# Patient Record
Sex: Female | Born: 1935
Health system: Southern US, Community
[De-identification: ages and names within clinical notes are randomized; demographics above are authoritative.]

## PROBLEM LIST (undated history)

## (undated) DIAGNOSIS — E785 Hyperlipidemia, unspecified: Secondary | ICD-10-CM

## (undated) DIAGNOSIS — I441 Atrioventricular block, second degree: Secondary | ICD-10-CM

## (undated) DIAGNOSIS — M1990 Unspecified osteoarthritis, unspecified site: Secondary | ICD-10-CM

## (undated) DIAGNOSIS — K449 Diaphragmatic hernia without obstruction or gangrene: Secondary | ICD-10-CM

## (undated) DIAGNOSIS — G629 Polyneuropathy, unspecified: Secondary | ICD-10-CM

## (undated) DIAGNOSIS — Z86711 Personal history of pulmonary embolism: Secondary | ICD-10-CM

## (undated) DIAGNOSIS — Z8719 Personal history of other diseases of the digestive system: Secondary | ICD-10-CM

## (undated) DIAGNOSIS — I10 Essential (primary) hypertension: Secondary | ICD-10-CM

## (undated) DIAGNOSIS — E039 Hypothyroidism, unspecified: Secondary | ICD-10-CM

## (undated) DIAGNOSIS — N289 Disorder of kidney and ureter, unspecified: Secondary | ICD-10-CM

## (undated) DIAGNOSIS — E669 Obesity, unspecified: Secondary | ICD-10-CM

## (undated) DIAGNOSIS — I5032 Chronic diastolic (congestive) heart failure: Secondary | ICD-10-CM

## (undated) DIAGNOSIS — Z952 Presence of prosthetic heart valve: Secondary | ICD-10-CM

## (undated) DIAGNOSIS — B029 Zoster without complications: Secondary | ICD-10-CM

## (undated) DIAGNOSIS — I82402 Acute embolism and thrombosis of unspecified deep veins of left lower extremity: Secondary | ICD-10-CM

## (undated) DIAGNOSIS — K219 Gastro-esophageal reflux disease without esophagitis: Secondary | ICD-10-CM

## (undated) DIAGNOSIS — Z950 Presence of cardiac pacemaker: Secondary | ICD-10-CM

## (undated) DIAGNOSIS — F32A Depression, unspecified: Secondary | ICD-10-CM

## (undated) DIAGNOSIS — I4892 Unspecified atrial flutter: Secondary | ICD-10-CM

## (undated) DIAGNOSIS — I4891 Unspecified atrial fibrillation: Secondary | ICD-10-CM

## (undated) DIAGNOSIS — F419 Anxiety disorder, unspecified: Secondary | ICD-10-CM

## (undated) DIAGNOSIS — I2699 Other pulmonary embolism without acute cor pulmonale: Secondary | ICD-10-CM

## (undated) DIAGNOSIS — R911 Solitary pulmonary nodule: Secondary | ICD-10-CM

## (undated) DIAGNOSIS — E119 Type 2 diabetes mellitus without complications: Secondary | ICD-10-CM

## (undated) DIAGNOSIS — F329 Major depressive disorder, single episode, unspecified: Secondary | ICD-10-CM

## (undated) HISTORY — DX: Acute embolism and thrombosis of unspecified deep veins of left lower extremity: I82.402

## (undated) HISTORY — DX: Disorder of kidney and ureter, unspecified: N28.9

## (undated) HISTORY — PX: ROTATOR CUFF REPAIR: SHX139

## (undated) HISTORY — PX: APPENDECTOMY: SHX54

## (undated) HISTORY — DX: Depression, unspecified: F32.A

## (undated) HISTORY — DX: Polyneuropathy, unspecified: G62.9

## (undated) HISTORY — PX: HERNIA REPAIR: SHX51

## (undated) HISTORY — DX: Unspecified atrial fibrillation: I48.91

## (undated) HISTORY — DX: Diaphragmatic hernia without obstruction or gangrene: K44.9

## (undated) HISTORY — PX: CATARACT EXTRACTION: SUR2

## (undated) HISTORY — DX: Hyperlipidemia, unspecified: E78.5

## (undated) HISTORY — DX: Type 2 diabetes mellitus without complications: E11.9

## (undated) HISTORY — DX: Gastro-esophageal reflux disease without esophagitis: K21.9

## (undated) HISTORY — DX: Other pulmonary embolism without acute cor pulmonale: I26.99

## (undated) HISTORY — DX: Obesity, unspecified: E66.9

## (undated) HISTORY — DX: Zoster without complications: B02.9

## (undated) HISTORY — DX: Anxiety disorder, unspecified: F41.9

## (undated) HISTORY — PX: INGUINAL HERNIA REPAIR: SHX194

## (undated) HISTORY — DX: Personal history of other diseases of the digestive system: Z87.19

## (undated) HISTORY — PX: CARPAL TUNNEL RELEASE: SHX101

## (undated) HISTORY — DX: Unspecified atrial flutter: I48.92

## (undated) HISTORY — PX: CHOLECYSTECTOMY: SHX55

## (undated) HISTORY — DX: Hypothyroidism, unspecified: E03.9

## (undated) HISTORY — DX: Essential (primary) hypertension: I10

## (undated) HISTORY — DX: Major depressive disorder, single episode, unspecified: F32.9

## (undated) HISTORY — DX: Unspecified osteoarthritis, unspecified site: M19.90

---
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
## 1998-08-17 ENCOUNTER — Ambulatory Visit (HOSPITAL_COMMUNITY): Admission: RE | Admit: 1998-08-17 | Discharge: 1998-08-17 | Payer: Self-pay | Admitting: Gastroenterology

## 1999-01-31 ENCOUNTER — Other Ambulatory Visit: Admission: RE | Admit: 1999-01-31 | Discharge: 1999-01-31 | Payer: Self-pay | Admitting: Family Medicine

## 2000-01-18 ENCOUNTER — Other Ambulatory Visit: Admission: RE | Admit: 2000-01-18 | Discharge: 2000-01-18 | Payer: Self-pay | Admitting: *Deleted

## 2000-05-18 ENCOUNTER — Encounter: Admission: RE | Admit: 2000-05-18 | Discharge: 2000-05-18 | Payer: Self-pay | Admitting: *Deleted

## 2001-04-02 ENCOUNTER — Other Ambulatory Visit: Admission: RE | Admit: 2001-04-02 | Discharge: 2001-04-02 | Payer: Self-pay | Admitting: Family Medicine

## 2002-01-14 ENCOUNTER — Encounter: Payer: Self-pay | Admitting: Family Medicine

## 2002-01-14 ENCOUNTER — Ambulatory Visit (HOSPITAL_COMMUNITY): Admission: RE | Admit: 2002-01-14 | Discharge: 2002-01-14 | Payer: Self-pay | Admitting: Family Medicine

## 2002-01-30 ENCOUNTER — Encounter: Payer: Self-pay | Admitting: Family Medicine

## 2002-01-30 ENCOUNTER — Encounter: Admission: RE | Admit: 2002-01-30 | Discharge: 2002-01-30 | Payer: Self-pay | Admitting: Family Medicine

## 2002-03-23 ENCOUNTER — Encounter: Payer: Self-pay | Admitting: Endocrinology

## 2002-03-23 ENCOUNTER — Ambulatory Visit (HOSPITAL_COMMUNITY): Admission: RE | Admit: 2002-03-23 | Discharge: 2002-03-23 | Payer: Self-pay | Admitting: Family Medicine

## 2002-04-08 ENCOUNTER — Other Ambulatory Visit: Admission: RE | Admit: 2002-04-08 | Discharge: 2002-04-08 | Payer: Self-pay | Admitting: Family Medicine

## 2002-12-17 ENCOUNTER — Ambulatory Visit (HOSPITAL_BASED_OUTPATIENT_CLINIC_OR_DEPARTMENT_OTHER): Admission: RE | Admit: 2002-12-17 | Discharge: 2002-12-17 | Payer: Self-pay | Admitting: *Deleted

## 2003-04-01 ENCOUNTER — Encounter: Admission: RE | Admit: 2003-04-01 | Discharge: 2003-04-01 | Payer: Self-pay | Admitting: Family Medicine

## 2003-07-30 ENCOUNTER — Other Ambulatory Visit: Admission: RE | Admit: 2003-07-30 | Discharge: 2003-07-30 | Payer: Self-pay | Admitting: Family Medicine

## 2003-10-01 ENCOUNTER — Emergency Department (HOSPITAL_COMMUNITY): Admission: EM | Admit: 2003-10-01 | Discharge: 2003-10-01 | Payer: Self-pay | Admitting: Family Medicine

## 2003-10-08 ENCOUNTER — Ambulatory Visit (HOSPITAL_COMMUNITY): Admission: RE | Admit: 2003-10-08 | Discharge: 2003-10-08 | Payer: Self-pay | Admitting: Orthopedic Surgery

## 2003-10-08 ENCOUNTER — Ambulatory Visit (HOSPITAL_BASED_OUTPATIENT_CLINIC_OR_DEPARTMENT_OTHER): Admission: RE | Admit: 2003-10-08 | Discharge: 2003-10-08 | Payer: Self-pay | Admitting: Orthopedic Surgery

## 2003-10-08 ENCOUNTER — Encounter (INDEPENDENT_AMBULATORY_CARE_PROVIDER_SITE_OTHER): Payer: Self-pay | Admitting: Specialist

## 2004-01-28 ENCOUNTER — Ambulatory Visit (HOSPITAL_BASED_OUTPATIENT_CLINIC_OR_DEPARTMENT_OTHER): Admission: RE | Admit: 2004-01-28 | Discharge: 2004-01-28 | Payer: Self-pay | Admitting: Orthopedic Surgery

## 2004-01-28 ENCOUNTER — Ambulatory Visit (HOSPITAL_COMMUNITY): Admission: RE | Admit: 2004-01-28 | Discharge: 2004-01-28 | Payer: Self-pay | Admitting: Orthopedic Surgery

## 2004-01-28 ENCOUNTER — Encounter (INDEPENDENT_AMBULATORY_CARE_PROVIDER_SITE_OTHER): Payer: Self-pay | Admitting: *Deleted

## 2004-02-17 ENCOUNTER — Ambulatory Visit: Payer: Self-pay | Admitting: Family Medicine

## 2004-03-16 ENCOUNTER — Ambulatory Visit: Payer: Self-pay | Admitting: Gastroenterology

## 2004-03-17 ENCOUNTER — Ambulatory Visit: Payer: Self-pay | Admitting: Gastroenterology

## 2004-03-22 ENCOUNTER — Ambulatory Visit: Payer: Self-pay | Admitting: Gastroenterology

## 2004-03-22 ENCOUNTER — Encounter: Payer: Self-pay | Admitting: Gastroenterology

## 2004-04-29 ENCOUNTER — Encounter: Admission: RE | Admit: 2004-04-29 | Discharge: 2004-04-29 | Payer: Self-pay | Admitting: Family Medicine

## 2004-05-18 ENCOUNTER — Ambulatory Visit: Payer: Self-pay | Admitting: Gastroenterology

## 2004-05-20 ENCOUNTER — Ambulatory Visit (HOSPITAL_COMMUNITY): Admission: RE | Admit: 2004-05-20 | Discharge: 2004-05-20 | Payer: Self-pay | Admitting: Gastroenterology

## 2004-06-02 ENCOUNTER — Ambulatory Visit: Payer: Self-pay | Admitting: Gastroenterology

## 2005-04-14 ENCOUNTER — Ambulatory Visit: Payer: Self-pay | Admitting: Internal Medicine

## 2005-05-17 ENCOUNTER — Emergency Department (HOSPITAL_COMMUNITY): Admission: EM | Admit: 2005-05-17 | Discharge: 2005-05-18 | Payer: Self-pay | Admitting: Emergency Medicine

## 2005-05-26 ENCOUNTER — Ambulatory Visit: Payer: Self-pay | Admitting: Internal Medicine

## 2005-06-12 ENCOUNTER — Ambulatory Visit: Payer: Self-pay | Admitting: Internal Medicine

## 2005-07-27 ENCOUNTER — Ambulatory Visit: Payer: Self-pay | Admitting: Internal Medicine

## 2005-08-03 ENCOUNTER — Ambulatory Visit: Payer: Self-pay | Admitting: Cardiology

## 2005-11-16 ENCOUNTER — Ambulatory Visit: Payer: Self-pay | Admitting: Internal Medicine

## 2005-12-02 ENCOUNTER — Ambulatory Visit: Payer: Self-pay | Admitting: Family Medicine

## 2005-12-06 ENCOUNTER — Ambulatory Visit: Payer: Self-pay | Admitting: Internal Medicine

## 2006-01-17 ENCOUNTER — Ambulatory Visit: Payer: Self-pay | Admitting: Internal Medicine

## 2006-02-24 ENCOUNTER — Ambulatory Visit: Payer: Self-pay | Admitting: Family Medicine

## 2006-04-12 ENCOUNTER — Ambulatory Visit: Payer: Self-pay | Admitting: Internal Medicine

## 2006-07-17 ENCOUNTER — Ambulatory Visit: Payer: Self-pay | Admitting: Internal Medicine

## 2006-10-27 ENCOUNTER — Encounter: Payer: Self-pay | Admitting: Internal Medicine

## 2006-11-06 ENCOUNTER — Ambulatory Visit: Payer: Self-pay | Admitting: Internal Medicine

## 2006-11-06 DIAGNOSIS — Z8679 Personal history of other diseases of the circulatory system: Secondary | ICD-10-CM | POA: Insufficient documentation

## 2006-11-06 DIAGNOSIS — G5600 Carpal tunnel syndrome, unspecified upper limb: Secondary | ICD-10-CM | POA: Insufficient documentation

## 2006-11-06 DIAGNOSIS — F4323 Adjustment disorder with mixed anxiety and depressed mood: Secondary | ICD-10-CM | POA: Insufficient documentation

## 2006-11-06 DIAGNOSIS — F411 Generalized anxiety disorder: Secondary | ICD-10-CM

## 2006-11-06 DIAGNOSIS — Z872 Personal history of diseases of the skin and subcutaneous tissue: Secondary | ICD-10-CM | POA: Insufficient documentation

## 2006-11-06 DIAGNOSIS — I10 Essential (primary) hypertension: Secondary | ICD-10-CM | POA: Insufficient documentation

## 2006-11-06 DIAGNOSIS — E039 Hypothyroidism, unspecified: Secondary | ICD-10-CM | POA: Insufficient documentation

## 2006-11-06 DIAGNOSIS — M1990 Unspecified osteoarthritis, unspecified site: Secondary | ICD-10-CM

## 2006-11-06 DIAGNOSIS — K449 Diaphragmatic hernia without obstruction or gangrene: Secondary | ICD-10-CM | POA: Insufficient documentation

## 2006-11-06 LAB — CONVERTED CEMR LAB
ALT: 27 units/L (ref 0–35)
AST: 31 units/L (ref 0–37)
Albumin: 3.6 g/dL (ref 3.5–5.2)
Bilirubin, Direct: 0.1 mg/dL (ref 0.0–0.3)
Calcium: 9.3 mg/dL (ref 8.4–10.5)
Chloride: 104 meq/L (ref 96–112)
Cholesterol: 208 mg/dL (ref 0–200)
Creatinine, Ser: 0.7 mg/dL (ref 0.4–1.2)
GFR calc non Af Amer: 88 mL/min
Glucose, Bld: 124 mg/dL — ABNORMAL HIGH (ref 70–99)
Total Bilirubin: 0.8 mg/dL (ref 0.3–1.2)
Total CHOL/HDL Ratio: 5.9

## 2006-12-03 ENCOUNTER — Emergency Department (HOSPITAL_COMMUNITY): Admission: EM | Admit: 2006-12-03 | Discharge: 2006-12-03 | Payer: Self-pay | Admitting: Emergency Medicine

## 2006-12-04 ENCOUNTER — Ambulatory Visit: Payer: Self-pay | Admitting: Internal Medicine

## 2007-02-05 ENCOUNTER — Ambulatory Visit: Payer: Self-pay | Admitting: Internal Medicine

## 2007-02-05 DIAGNOSIS — R609 Edema, unspecified: Secondary | ICD-10-CM

## 2007-02-05 DIAGNOSIS — M79609 Pain in unspecified limb: Secondary | ICD-10-CM

## 2007-03-01 ENCOUNTER — Ambulatory Visit: Payer: Self-pay | Admitting: Internal Medicine

## 2007-03-01 DIAGNOSIS — Z87898 Personal history of other specified conditions: Secondary | ICD-10-CM

## 2007-03-03 DIAGNOSIS — Z8719 Personal history of other diseases of the digestive system: Secondary | ICD-10-CM

## 2007-03-03 DIAGNOSIS — E785 Hyperlipidemia, unspecified: Secondary | ICD-10-CM | POA: Insufficient documentation

## 2007-04-04 DIAGNOSIS — E119 Type 2 diabetes mellitus without complications: Secondary | ICD-10-CM

## 2007-04-04 HISTORY — DX: Type 2 diabetes mellitus without complications: E11.9

## 2007-05-07 ENCOUNTER — Ambulatory Visit: Payer: Self-pay | Admitting: Internal Medicine

## 2007-05-13 LAB — CONVERTED CEMR LAB
AST: 28 units/L (ref 0–37)
Bilirubin, Direct: 0.2 mg/dL (ref 0.0–0.3)
Chloride: 100 meq/L (ref 96–112)
Creatinine, Ser: 0.9 mg/dL (ref 0.4–1.2)
Glucose, Bld: 119 mg/dL — ABNORMAL HIGH (ref 70–99)
Potassium: 3.9 meq/L (ref 3.5–5.1)
Sodium: 138 meq/L (ref 135–145)
TSH: 3.4 microintl units/mL (ref 0.35–5.50)
Total Bilirubin: 1 mg/dL (ref 0.3–1.2)

## 2007-05-15 ENCOUNTER — Ambulatory Visit: Payer: Self-pay | Admitting: Internal Medicine

## 2007-05-15 DIAGNOSIS — F4321 Adjustment disorder with depressed mood: Secondary | ICD-10-CM | POA: Insufficient documentation

## 2007-05-15 DIAGNOSIS — R079 Chest pain, unspecified: Secondary | ICD-10-CM

## 2007-05-28 ENCOUNTER — Ambulatory Visit: Payer: Self-pay

## 2007-05-28 ENCOUNTER — Encounter: Payer: Self-pay | Admitting: Internal Medicine

## 2007-05-30 ENCOUNTER — Ambulatory Visit: Payer: Self-pay

## 2007-06-01 ENCOUNTER — Encounter: Payer: Self-pay | Admitting: Family Medicine

## 2007-06-01 DIAGNOSIS — J019 Acute sinusitis, unspecified: Secondary | ICD-10-CM

## 2007-06-03 ENCOUNTER — Telehealth: Payer: Self-pay | Admitting: Internal Medicine

## 2007-06-14 ENCOUNTER — Telehealth: Payer: Self-pay | Admitting: Internal Medicine

## 2007-06-17 ENCOUNTER — Telehealth: Payer: Self-pay | Admitting: Internal Medicine

## 2007-07-16 ENCOUNTER — Ambulatory Visit: Payer: Self-pay | Admitting: Internal Medicine

## 2007-07-16 DIAGNOSIS — R7309 Other abnormal glucose: Secondary | ICD-10-CM

## 2007-07-17 LAB — CONVERTED CEMR LAB
ALT: 27 units/L (ref 0–35)
Alkaline Phosphatase: 84 units/L (ref 39–117)
Bilirubin, Direct: 0.1 mg/dL (ref 0.0–0.3)
CO2: 28 meq/L (ref 19–32)
Chloride: 100 meq/L (ref 96–112)
GFR calc Af Amer: 106 mL/min
Glucose, Bld: 164 mg/dL — ABNORMAL HIGH (ref 70–99)
Lymphocytes Relative: 32.6 % (ref 12.0–46.0)
Monocytes Relative: 8.9 % (ref 3.0–12.0)
Neutrophils Relative %: 54.2 % (ref 43.0–77.0)
Platelets: 357 10*3/uL (ref 150–400)
Potassium: 4.1 meq/L (ref 3.5–5.1)
RDW: 12.2 % (ref 11.5–14.6)
Sodium: 138 meq/L (ref 135–145)
Total Protein: 7.8 g/dL (ref 6.0–8.3)

## 2007-09-16 ENCOUNTER — Ambulatory Visit: Payer: Self-pay | Admitting: Internal Medicine

## 2007-09-16 ENCOUNTER — Encounter: Payer: Self-pay | Admitting: Internal Medicine

## 2007-09-17 ENCOUNTER — Telehealth: Payer: Self-pay | Admitting: Internal Medicine

## 2007-09-17 ENCOUNTER — Encounter: Payer: Self-pay | Admitting: Internal Medicine

## 2007-09-17 LAB — CONVERTED CEMR LAB
Bilirubin Urine: NEGATIVE
Ketones, ur: NEGATIVE mg/dL
Mucus, UA: NEGATIVE
Nitrite: NEGATIVE
Nitrite: NEGATIVE
Specific Gravity, Urine: 1.02 (ref 1.000–1.03)
Total Protein, Urine: NEGATIVE mg/dL
pH: 5.5 (ref 5.0–8.0)
pH: 5.5 (ref 5.0–8.0)

## 2007-10-11 ENCOUNTER — Ambulatory Visit: Payer: Self-pay | Admitting: Internal Medicine

## 2007-10-11 LAB — CONVERTED CEMR LAB
Chloride: 104 meq/L (ref 96–112)
GFR calc non Af Amer: 75 mL/min
Hgb A1c MFr Bld: 7.3 % — ABNORMAL HIGH (ref 4.6–6.0)
Potassium: 4.2 meq/L (ref 3.5–5.1)
Sodium: 140 meq/L (ref 135–145)

## 2007-10-15 ENCOUNTER — Ambulatory Visit: Payer: Self-pay | Admitting: Internal Medicine

## 2007-10-15 ENCOUNTER — Encounter (INDEPENDENT_AMBULATORY_CARE_PROVIDER_SITE_OTHER): Payer: Self-pay | Admitting: *Deleted

## 2007-10-15 DIAGNOSIS — M545 Low back pain: Secondary | ICD-10-CM

## 2007-11-04 ENCOUNTER — Encounter: Payer: Self-pay | Admitting: Internal Medicine

## 2007-12-28 ENCOUNTER — Ambulatory Visit: Payer: Self-pay | Admitting: *Deleted

## 2007-12-28 DIAGNOSIS — J069 Acute upper respiratory infection, unspecified: Secondary | ICD-10-CM | POA: Insufficient documentation

## 2007-12-31 ENCOUNTER — Telehealth: Payer: Self-pay | Admitting: Internal Medicine

## 2008-01-15 ENCOUNTER — Ambulatory Visit: Payer: Self-pay | Admitting: Internal Medicine

## 2008-01-16 LAB — CONVERTED CEMR LAB
Chloride: 103 meq/L (ref 96–112)
GFR calc Af Amer: 106 mL/min
GFR calc non Af Amer: 87 mL/min
Hgb A1c MFr Bld: 7.4 % — ABNORMAL HIGH (ref 4.6–6.0)
Potassium: 4.6 meq/L (ref 3.5–5.1)

## 2008-01-20 ENCOUNTER — Ambulatory Visit: Payer: Self-pay | Admitting: Internal Medicine

## 2008-01-20 DIAGNOSIS — E118 Type 2 diabetes mellitus with unspecified complications: Secondary | ICD-10-CM | POA: Insufficient documentation

## 2008-01-20 DIAGNOSIS — R05 Cough: Secondary | ICD-10-CM

## 2008-01-20 DIAGNOSIS — R059 Cough, unspecified: Secondary | ICD-10-CM | POA: Insufficient documentation

## 2008-01-27 ENCOUNTER — Telehealth: Payer: Self-pay | Admitting: Internal Medicine

## 2008-03-03 ENCOUNTER — Telehealth: Payer: Self-pay | Admitting: Internal Medicine

## 2008-03-04 ENCOUNTER — Encounter: Payer: Self-pay | Admitting: Internal Medicine

## 2008-03-23 ENCOUNTER — Telehealth: Payer: Self-pay | Admitting: Internal Medicine

## 2008-04-03 DIAGNOSIS — I82402 Acute embolism and thrombosis of unspecified deep veins of left lower extremity: Secondary | ICD-10-CM

## 2008-04-03 DIAGNOSIS — N289 Disorder of kidney and ureter, unspecified: Secondary | ICD-10-CM

## 2008-04-03 HISTORY — DX: Disorder of kidney and ureter, unspecified: N28.9

## 2008-04-03 HISTORY — DX: Acute embolism and thrombosis of unspecified deep veins of left lower extremity: I82.402

## 2008-04-03 HISTORY — PX: COLONOSCOPY: SHX174

## 2008-04-22 ENCOUNTER — Ambulatory Visit: Payer: Self-pay | Admitting: Internal Medicine

## 2008-04-23 LAB — CONVERTED CEMR LAB
BUN: 15 mg/dL (ref 6–23)
Calcium: 9.4 mg/dL (ref 8.4–10.5)
GFR calc Af Amer: 91 mL/min
GFR calc non Af Amer: 75 mL/min
Potassium: 4.1 meq/L (ref 3.5–5.1)
Sodium: 140 meq/L (ref 135–145)

## 2008-04-27 ENCOUNTER — Ambulatory Visit: Payer: Self-pay | Admitting: Internal Medicine

## 2008-05-18 ENCOUNTER — Encounter: Payer: Self-pay | Admitting: Internal Medicine

## 2008-05-22 ENCOUNTER — Encounter: Payer: Self-pay | Admitting: Internal Medicine

## 2008-06-08 ENCOUNTER — Telehealth: Payer: Self-pay | Admitting: Internal Medicine

## 2008-06-10 ENCOUNTER — Telehealth: Payer: Self-pay | Admitting: Internal Medicine

## 2008-06-16 ENCOUNTER — Telehealth: Payer: Self-pay | Admitting: Internal Medicine

## 2008-06-29 DIAGNOSIS — K5730 Diverticulosis of large intestine without perforation or abscess without bleeding: Secondary | ICD-10-CM | POA: Insufficient documentation

## 2008-06-29 DIAGNOSIS — K2940 Chronic atrophic gastritis without bleeding: Secondary | ICD-10-CM | POA: Insufficient documentation

## 2008-06-29 DIAGNOSIS — K2980 Duodenitis without bleeding: Secondary | ICD-10-CM | POA: Insufficient documentation

## 2008-06-29 DIAGNOSIS — D126 Benign neoplasm of colon, unspecified: Secondary | ICD-10-CM

## 2008-07-07 ENCOUNTER — Ambulatory Visit: Payer: Self-pay | Admitting: Gastroenterology

## 2008-07-07 DIAGNOSIS — R1032 Left lower quadrant pain: Secondary | ICD-10-CM | POA: Insufficient documentation

## 2008-07-16 ENCOUNTER — Ambulatory Visit: Payer: Self-pay | Admitting: Internal Medicine

## 2008-07-16 LAB — CONVERTED CEMR LAB
BUN: 18 mg/dL (ref 6–23)
Calcium: 9.7 mg/dL (ref 8.4–10.5)
GFR calc non Af Amer: 57.79 mL/min (ref >60)
Hgb A1c MFr Bld: 7.4 % — ABNORMAL HIGH (ref 4.6–6.5)
Potassium: 4 meq/L (ref 3.5–5.1)
Sodium: 138 meq/L (ref 135–145)

## 2008-07-20 ENCOUNTER — Ambulatory Visit: Payer: Self-pay | Admitting: Internal Medicine

## 2008-07-30 ENCOUNTER — Ambulatory Visit: Payer: Self-pay | Admitting: Gastroenterology

## 2008-08-08 ENCOUNTER — Encounter: Payer: Self-pay | Admitting: Internal Medicine

## 2008-09-17 HISTORY — PX: FOREARM FRACTURE SURGERY: SHX649

## 2008-09-22 ENCOUNTER — Telehealth (INDEPENDENT_AMBULATORY_CARE_PROVIDER_SITE_OTHER): Payer: Self-pay | Admitting: *Deleted

## 2008-09-22 ENCOUNTER — Telehealth: Payer: Self-pay | Admitting: Internal Medicine

## 2008-09-30 ENCOUNTER — Ambulatory Visit: Payer: Self-pay | Admitting: Internal Medicine

## 2008-09-30 LAB — CONVERTED CEMR LAB
ALT: 20 units/L (ref 0–35)
Bilirubin, Direct: 0.2 mg/dL (ref 0.0–0.3)
Chloride: 103 meq/L (ref 96–112)
Creatinine, Ser: 0.9 mg/dL (ref 0.4–1.2)
GFR calc non Af Amer: 65.23 mL/min (ref >60)
Hgb A1c MFr Bld: 5.5 % (ref 4.6–6.5)
TSH: 3.66 microintl units/mL (ref 0.35–5.50)
Total Bilirubin: 1.2 mg/dL (ref 0.3–1.2)
Vit D, 25-Hydroxy: 61 ng/mL (ref 30–89)

## 2008-10-01 DIAGNOSIS — Z86711 Personal history of pulmonary embolism: Secondary | ICD-10-CM | POA: Diagnosis present

## 2008-10-01 HISTORY — DX: Personal history of pulmonary embolism: Z86.711

## 2008-10-06 ENCOUNTER — Ambulatory Visit: Payer: Self-pay | Admitting: Internal Medicine

## 2008-10-06 LAB — CONVERTED CEMR LAB
Calcium: 9.7 mg/dL (ref 8.4–10.5)
GFR calc non Af Amer: 39.17 mL/min (ref >60)
Potassium: 4.7 meq/L (ref 3.5–5.1)
Pro B Natriuretic peptide (BNP): 21 pg/mL (ref 0.0–100.0)
Sodium: 136 meq/L (ref 135–145)
Total CK: 36 units/L (ref 7–177)

## 2008-10-07 ENCOUNTER — Telehealth: Payer: Self-pay | Admitting: Internal Medicine

## 2008-10-07 ENCOUNTER — Inpatient Hospital Stay (HOSPITAL_COMMUNITY): Admission: EM | Admit: 2008-10-07 | Discharge: 2008-10-10 | Payer: Self-pay | Admitting: Emergency Medicine

## 2008-10-07 ENCOUNTER — Ambulatory Visit: Payer: Self-pay | Admitting: Internal Medicine

## 2008-10-08 ENCOUNTER — Ambulatory Visit: Payer: Self-pay | Admitting: Internal Medicine

## 2008-10-08 ENCOUNTER — Ambulatory Visit: Payer: Self-pay | Admitting: Surgery

## 2008-10-08 ENCOUNTER — Encounter: Payer: Self-pay | Admitting: Internal Medicine

## 2008-10-09 ENCOUNTER — Encounter: Payer: Self-pay | Admitting: Internal Medicine

## 2008-10-13 ENCOUNTER — Ambulatory Visit: Payer: Self-pay | Admitting: Cardiology

## 2008-10-13 DIAGNOSIS — I2699 Other pulmonary embolism without acute cor pulmonale: Secondary | ICD-10-CM

## 2008-10-13 LAB — CONVERTED CEMR LAB
POC INR: 2.7
Prothrombin Time: 27.9 s

## 2008-10-15 ENCOUNTER — Telehealth: Payer: Self-pay | Admitting: Internal Medicine

## 2008-10-16 ENCOUNTER — Ambulatory Visit: Payer: Self-pay | Admitting: Internal Medicine

## 2008-10-16 LAB — CONVERTED CEMR LAB
ALT: 25 units/L (ref 0–35)
AST: 38 units/L — ABNORMAL HIGH (ref 0–37)
BUN: 8 mg/dL (ref 6–23)
CO2: 26 meq/L (ref 19–32)
Calcium: 9.4 mg/dL (ref 8.4–10.5)
GFR calc non Af Amer: 74.71 mL/min (ref >60)
Glucose, Bld: 105 mg/dL — ABNORMAL HIGH (ref 70–99)
Hemoglobin: 11.9 g/dL — ABNORMAL LOW (ref 12.0–15.0)
Total Bilirubin: 0.6 mg/dL (ref 0.3–1.2)
Total Protein: 7.8 g/dL (ref 6.0–8.3)

## 2008-10-20 ENCOUNTER — Ambulatory Visit: Payer: Self-pay | Admitting: Internal Medicine

## 2008-10-20 ENCOUNTER — Ambulatory Visit: Payer: Self-pay | Admitting: Cardiology

## 2008-10-20 DIAGNOSIS — N259 Disorder resulting from impaired renal tubular function, unspecified: Secondary | ICD-10-CM | POA: Insufficient documentation

## 2008-10-20 LAB — CONVERTED CEMR LAB: Prothrombin Time: 22.6 s

## 2008-10-21 ENCOUNTER — Encounter: Payer: Self-pay | Admitting: Internal Medicine

## 2008-10-30 ENCOUNTER — Encounter (INDEPENDENT_AMBULATORY_CARE_PROVIDER_SITE_OTHER): Payer: Self-pay | Admitting: Cardiology

## 2008-10-30 ENCOUNTER — Ambulatory Visit: Payer: Self-pay | Admitting: Cardiology

## 2008-10-30 LAB — CONVERTED CEMR LAB: POC INR: 2.5

## 2008-11-09 ENCOUNTER — Encounter: Payer: Self-pay | Admitting: Internal Medicine

## 2008-11-13 ENCOUNTER — Ambulatory Visit: Payer: Self-pay | Admitting: Internal Medicine

## 2008-11-13 LAB — CONVERTED CEMR LAB: POC INR: 3.9

## 2008-11-25 ENCOUNTER — Ambulatory Visit: Payer: Self-pay | Admitting: Internal Medicine

## 2008-11-25 LAB — CONVERTED CEMR LAB
BUN: 11 mg/dL (ref 6–23)
CO2: 28 meq/L (ref 19–32)
Chloride: 106 meq/L (ref 96–112)
Creatinine, Ser: 0.7 mg/dL (ref 0.4–1.2)
Potassium: 4.2 meq/L (ref 3.5–5.1)

## 2008-11-27 ENCOUNTER — Ambulatory Visit: Payer: Self-pay | Admitting: Cardiovascular Disease

## 2008-11-27 ENCOUNTER — Ambulatory Visit: Payer: Self-pay | Admitting: Internal Medicine

## 2008-11-27 LAB — CONVERTED CEMR LAB: POC INR: 2.4

## 2008-12-01 ENCOUNTER — Ambulatory Visit: Payer: Self-pay | Admitting: Internal Medicine

## 2008-12-18 ENCOUNTER — Ambulatory Visit: Payer: Self-pay | Admitting: Internal Medicine

## 2008-12-18 LAB — CONVERTED CEMR LAB: POC INR: 2.5

## 2009-01-15 ENCOUNTER — Ambulatory Visit: Payer: Self-pay | Admitting: Internal Medicine

## 2009-01-26 ENCOUNTER — Ambulatory Visit: Payer: Self-pay | Admitting: Internal Medicine

## 2009-01-26 LAB — CONVERTED CEMR LAB
ALT: 16 units/L (ref 0–35)
AST: 19 units/L (ref 0–37)
Alkaline Phosphatase: 88 units/L (ref 39–117)
Basophils Relative: 0.9 % (ref 0.0–3.0)
Chloride: 104 meq/L (ref 96–112)
Eosinophils Absolute: 0.2 10*3/uL (ref 0.0–0.7)
Eosinophils Relative: 2.6 % (ref 0.0–5.0)
GFR calc non Af Amer: 74.66 mL/min (ref >60)
Hemoglobin: 13.9 g/dL (ref 12.0–15.0)
Lymphocytes Relative: 35.9 % (ref 12.0–46.0)
Monocytes Relative: 8.2 % (ref 3.0–12.0)
Neutrophils Relative %: 52.4 % (ref 43.0–77.0)
Nitrite: NEGATIVE
Potassium: 4 meq/L (ref 3.5–5.1)
RBC: 4.57 M/uL (ref 3.87–5.11)
Sodium: 138 meq/L (ref 135–145)
Specific Gravity, Urine: 1.015 (ref 1.000–1.030)
TSH: 3.58 microintl units/mL (ref 0.35–5.50)
Total Bilirubin: 0.5 mg/dL (ref 0.3–1.2)
Total CHOL/HDL Ratio: 6
Total Protein, Urine: NEGATIVE mg/dL
VLDL: 40.2 mg/dL — ABNORMAL HIGH (ref 0.0–40.0)
WBC: 6.6 10*3/uL (ref 4.5–10.5)

## 2009-02-01 ENCOUNTER — Telehealth: Payer: Self-pay | Admitting: Internal Medicine

## 2009-02-01 ENCOUNTER — Ambulatory Visit: Payer: Self-pay | Admitting: Internal Medicine

## 2009-02-01 DIAGNOSIS — H6690 Otitis media, unspecified, unspecified ear: Secondary | ICD-10-CM | POA: Insufficient documentation

## 2009-02-01 DIAGNOSIS — M25529 Pain in unspecified elbow: Secondary | ICD-10-CM

## 2009-02-09 ENCOUNTER — Telehealth: Payer: Self-pay | Admitting: Internal Medicine

## 2009-02-12 ENCOUNTER — Ambulatory Visit: Payer: Self-pay | Admitting: Cardiology

## 2009-02-12 LAB — CONVERTED CEMR LAB: POC INR: 1.9

## 2009-03-11 ENCOUNTER — Telehealth: Payer: Self-pay | Admitting: Internal Medicine

## 2009-03-12 ENCOUNTER — Ambulatory Visit: Payer: Self-pay | Admitting: Cardiology

## 2009-03-12 LAB — CONVERTED CEMR LAB: POC INR: 1.9

## 2009-04-01 ENCOUNTER — Encounter (INDEPENDENT_AMBULATORY_CARE_PROVIDER_SITE_OTHER): Payer: Self-pay | Admitting: Cardiology

## 2009-04-01 ENCOUNTER — Ambulatory Visit: Payer: Self-pay | Admitting: Internal Medicine

## 2009-04-07 ENCOUNTER — Telehealth: Payer: Self-pay | Admitting: Internal Medicine

## 2009-04-08 ENCOUNTER — Telehealth: Payer: Self-pay | Admitting: Internal Medicine

## 2009-04-13 ENCOUNTER — Telehealth: Payer: Self-pay | Admitting: Internal Medicine

## 2009-04-14 ENCOUNTER — Encounter: Payer: Self-pay | Admitting: Internal Medicine

## 2009-04-27 ENCOUNTER — Encounter: Payer: Self-pay | Admitting: Internal Medicine

## 2009-04-29 ENCOUNTER — Ambulatory Visit: Payer: Self-pay | Admitting: Internal Medicine

## 2009-05-03 ENCOUNTER — Encounter: Payer: Self-pay | Admitting: Internal Medicine

## 2009-05-13 ENCOUNTER — Ambulatory Visit: Payer: Self-pay | Admitting: Cardiology

## 2009-05-13 LAB — CONVERTED CEMR LAB: POC INR: 2.7

## 2009-05-27 ENCOUNTER — Ambulatory Visit: Payer: Self-pay | Admitting: Internal Medicine

## 2009-05-27 LAB — CONVERTED CEMR LAB
CO2: 29 meq/L (ref 19–32)
Calcium: 9.6 mg/dL (ref 8.4–10.5)
GFR calc non Af Amer: 74.59 mL/min (ref >60)
Hgb A1c MFr Bld: 6.4 % (ref 4.6–6.5)
Sodium: 140 meq/L (ref 135–145)

## 2009-06-02 ENCOUNTER — Ambulatory Visit: Payer: Self-pay | Admitting: Internal Medicine

## 2009-06-02 DIAGNOSIS — R635 Abnormal weight gain: Secondary | ICD-10-CM

## 2009-06-03 ENCOUNTER — Ambulatory Visit: Payer: Self-pay | Admitting: Internal Medicine

## 2009-06-03 LAB — CONVERTED CEMR LAB: POC INR: 2.1

## 2009-06-11 ENCOUNTER — Ambulatory Visit: Payer: Self-pay | Admitting: Licensed Clinical Social Worker

## 2009-07-01 ENCOUNTER — Ambulatory Visit: Payer: Self-pay | Admitting: Cardiology

## 2009-07-29 ENCOUNTER — Ambulatory Visit: Payer: Self-pay | Admitting: Cardiology

## 2009-08-26 ENCOUNTER — Ambulatory Visit: Payer: Self-pay | Admitting: Internal Medicine

## 2009-09-01 ENCOUNTER — Ambulatory Visit: Payer: Self-pay | Admitting: Internal Medicine

## 2009-09-01 LAB — CONVERTED CEMR LAB
BUN: 19 mg/dL (ref 6–23)
Calcium: 9.6 mg/dL (ref 8.4–10.5)
GFR calc non Af Amer: 73.47 mL/min (ref >60)
Glucose, Bld: 129 mg/dL — ABNORMAL HIGH (ref 70–99)

## 2009-09-08 ENCOUNTER — Ambulatory Visit: Payer: Self-pay | Admitting: Internal Medicine

## 2009-09-15 ENCOUNTER — Encounter: Payer: Self-pay | Admitting: Internal Medicine

## 2009-10-19 ENCOUNTER — Ambulatory Visit: Payer: Self-pay | Admitting: Internal Medicine

## 2009-10-19 ENCOUNTER — Telehealth: Payer: Self-pay | Admitting: Internal Medicine

## 2009-10-21 ENCOUNTER — Telehealth (INDEPENDENT_AMBULATORY_CARE_PROVIDER_SITE_OTHER): Payer: Self-pay | Admitting: *Deleted

## 2009-11-10 ENCOUNTER — Telehealth: Payer: Self-pay | Admitting: Internal Medicine

## 2009-12-02 ENCOUNTER — Ambulatory Visit: Payer: Self-pay | Admitting: Internal Medicine

## 2009-12-03 LAB — CONVERTED CEMR LAB
BUN: 16 mg/dL (ref 6–23)
Calcium: 9.6 mg/dL (ref 8.4–10.5)
Creatinine,U: 105.2 mg/dL
Direct LDL: 159.2 mg/dL
GFR calc non Af Amer: 76.69 mL/min (ref >60)
Glucose, Bld: 104 mg/dL — ABNORMAL HIGH (ref 70–99)
Microalb, Ur: 4.5 mg/dL — ABNORMAL HIGH (ref 0.0–1.9)
Uric Acid, Serum: 7.8 mg/dL — ABNORMAL HIGH (ref 2.4–7.0)

## 2009-12-07 ENCOUNTER — Ambulatory Visit: Payer: Self-pay | Admitting: Internal Medicine

## 2009-12-21 ENCOUNTER — Telehealth: Payer: Self-pay | Admitting: Internal Medicine

## 2010-01-07 ENCOUNTER — Encounter: Payer: Self-pay | Admitting: Internal Medicine

## 2010-01-24 ENCOUNTER — Telehealth: Payer: Self-pay | Admitting: Internal Medicine

## 2010-02-14 ENCOUNTER — Encounter: Admission: RE | Admit: 2010-02-14 | Discharge: 2010-02-14 | Payer: Self-pay | Admitting: Orthopedic Surgery

## 2010-02-17 ENCOUNTER — Ambulatory Visit: Payer: Self-pay | Admitting: Internal Medicine

## 2010-02-28 ENCOUNTER — Telehealth: Payer: Self-pay | Admitting: Internal Medicine

## 2010-03-16 ENCOUNTER — Telehealth: Payer: Self-pay | Admitting: Internal Medicine

## 2010-03-31 ENCOUNTER — Telehealth: Payer: Self-pay | Admitting: Internal Medicine

## 2010-04-07 ENCOUNTER — Ambulatory Visit
Admission: RE | Admit: 2010-04-07 | Discharge: 2010-04-07 | Payer: Self-pay | Source: Home / Self Care | Attending: Internal Medicine | Admitting: Internal Medicine

## 2010-04-07 ENCOUNTER — Other Ambulatory Visit: Payer: Self-pay | Admitting: Internal Medicine

## 2010-04-07 LAB — BASIC METABOLIC PANEL
BUN: 16 mg/dL (ref 6–23)
CO2: 25 mEq/L (ref 19–32)
Calcium: 9.6 mg/dL (ref 8.4–10.5)
Chloride: 103 mEq/L (ref 96–112)
Creatinine, Ser: 0.5 mg/dL (ref 0.4–1.2)
GFR: 122.33 mL/min (ref >60.00)
Glucose, Bld: 93 mg/dL (ref 70–99)
Potassium: 4.5 mEq/L (ref 3.5–5.1)
Sodium: 138 mEq/L (ref 135–145)

## 2010-04-07 LAB — HEPATIC FUNCTION PANEL
ALT: 26 U/L (ref 0–35)
AST: 30 U/L (ref 0–37)
Albumin: 3.8 g/dL (ref 3.5–5.2)
Alkaline Phosphatase: 68 U/L (ref 39–117)
Bilirubin, Direct: 0.1 mg/dL (ref 0.0–0.3)
Total Bilirubin: 0.5 mg/dL (ref 0.3–1.2)
Total Protein: 7.3 g/dL (ref 6.0–8.3)

## 2010-04-07 LAB — URIC ACID: Uric Acid, Serum: 4.6 mg/dL (ref 2.4–7.0)

## 2010-04-12 ENCOUNTER — Ambulatory Visit
Admission: RE | Admit: 2010-04-12 | Discharge: 2010-04-12 | Payer: Self-pay | Source: Home / Self Care | Attending: Internal Medicine | Admitting: Internal Medicine

## 2010-04-12 ENCOUNTER — Encounter: Payer: Self-pay | Admitting: Internal Medicine

## 2010-04-12 DIAGNOSIS — L03818 Cellulitis of other sites: Secondary | ICD-10-CM

## 2010-04-12 DIAGNOSIS — L02818 Cutaneous abscess of other sites: Secondary | ICD-10-CM | POA: Insufficient documentation

## 2010-04-15 ENCOUNTER — Encounter: Payer: Self-pay | Admitting: Internal Medicine

## 2010-04-26 ENCOUNTER — Telehealth: Payer: Self-pay | Admitting: Internal Medicine

## 2010-04-27 ENCOUNTER — Telehealth: Payer: Self-pay | Admitting: Internal Medicine

## 2010-05-02 ENCOUNTER — Telehealth: Payer: Self-pay | Admitting: Internal Medicine

## 2010-05-05 NOTE — Miscellaneous (Signed)
Summary: Procedure consent  Procedure consent   Imported By: Lester [place] 04/18/2010 07:39:45  _____________________________________________________________________  External Attachment:    Type:   Image     Comment:   External Document

## 2010-05-05 NOTE — Progress Notes (Signed)
Summary: Rf Lorazepam  Phone Note Refill Request Message from:  Fax from [facility] on [facility] [phone]  Refills Requested: Medication #1:  ATIVAN 1 MG  TABS two times a day as needed   Dosage confirmed as above?Dosage Confirmed   Supply Requested: 180  Method Requested: Telephone to Pharmacy Initial call taken by: Lanier Prude, [facility]),  November 10, 2009 3:08 PM  Follow-up for Phone Call        ok x 1 Follow-up by: Tresa Garter MD,  November 10, 2009 5:27 PM  Additional Follow-up for Phone Call Additional follow up Details #1::        Rx called to pharmacy Additional Follow-up by: Lanier Prude, [facility]),  November 11, 2009 10:23 AM    Prescriptions: ATIVAN 1 MG  TABS (LORAZEPAM) two times a day as needed  #180 x 1   Entered by:   Lanier Prude, [facility])   Authorized by:   Tresa Garter MD   Signed by:   Lanier Prude, CMA(AAMA) on 11/11/2009   Method used:   Telephoned to ...       [facility]* (mail-order)       [address]       [place], [place]  [address]       Ph: [phone]       Fax: [phone]   RxID:   [id number]

## 2010-05-05 NOTE — Progress Notes (Signed)
Summary: Rf Temazepam  Phone Note Refill Request Message from:  Pharmacy  Refills Requested: Medication #1:  TEMAZEPAM 15 MG CAPS 1 at bedtime   Dosage confirmed as above?Dosage Confirmed   Supply Requested: 90 [facility]   Method Requested: Telephone to Pharmacy Initial call taken by: Lanier Prude, [facility]),  March 31, 2010 4:44 PM  Follow-up for Phone Call        ok to ref Follow-up by: Tresa Garter MD,  April 01, 2010 1:17 PM  Additional Follow-up for Phone Call Additional follow up Details #1::        Rx called to pharmacy Additional Follow-up by: Lanier Prude, [facility]),  April 01, 2010 4:38 PM    Prescriptions: TEMAZEPAM 15 MG CAPS (TEMAZEPAM) 1 at bedtime  #90 x 1   Entered by:   Lanier Prude, [facility])   Authorized by:   Tresa Garter MD   Signed by:   Lanier Prude, CMA(AAMA) on 04/01/2010   Method used:   Telephoned to ...       [facility]* (retail)       [address] [place], [place]  [address]       Ph: [phone]       Fax: [phone]   RxID:   [id number]

## 2010-05-05 NOTE — Progress Notes (Signed)
Summary: Rf Hydroco/Acetamin  Phone Note Refill Request Message from:  Fax from Pharmacy  Refills Requested: Medication #1:  HYDROCODONE-ACETAMINOPHEN 5-325 MG TABS 1 by mouth up to 4 times per day as needed for pain   Dosage confirmed as above?Dosage Confirmed   Supply Requested: 120   Last Refilled: 09/01/2009  Method Requested: Telephone to Pharmacy Next Appointment Scheduled: 12-07-09 Initial call taken by: Lanier Prude, [facility]),  October 21, 2009 8:18 AM  Follow-up for Phone Call        ok to ref Follow-up by: Tresa Garter MD,  October 22, 2009 7:53 AM    Prescriptions: HYDROCODONE-ACETAMINOPHEN 5-325 MG TABS (HYDROCODONE-ACETAMINOPHEN) 1 by mouth up to 4 times per day as needed for pain  #120 x 2   Entered by:   Ami Bullins CMA   Authorized by:   Tresa Garter MD   Signed by:   Bill Salinas CMA on 10/22/2009   Method used:   Telephoned to ...       [facility] [address] [phone]* (retail)       [address]       [place], [place]  [address]       Ph: [phone] or [phone]       Fax: [phone]   RxID:   [id number]

## 2010-05-05 NOTE — Medication Information (Signed)
Summary: rov.mp  Anticoagulant Therapy  Managed by: Ysidro Evert, Pharm D Candidate PCP: Sonda Primes, MD Supervising MD: Graciela Husbands MD, Viviann Spare Indication 1: Pulmonary Embolism Lab Used: [facility] [place] Site: [address] INR POC 1.8 INR RANGE 2-3  Dietary changes: no    Health status changes: no    Bleeding/hemorrhagic complications: no    Recent/future hospitalizations: no    Any changes in medication regimen? no    Recent/future dental: no  Any missed doses?: no       Is patient compliant with meds? yes       Allergies (verified): 1)  ! Vioxx 2)  ! Codeine 3)  ! Voltaren 4)  ! Percocet 5)  ! * Calcium 6)  Enalapril Maleate  Anticoagulation Management History:      The patient is taking warfarin and comes in today for a routine follow up visit.  Positive risk factors for bleeding include an age of 9 years or older and presence of serious comorbidities.  The bleeding index is 'intermediate risk'.  Positive CHADS2 values include History of HTN and History of Diabetes.  Negative CHADS2 values include Age > 71 years old.  Her last INR was 2.7 ratio.  Anticoagulation responsible provider: Graciela Husbands MD, [facility]are.  INR POC: 1.8.  Cuvette Lot#: [id number].  Exp: 07/2010.    Anticoagulation Management Assessment/Plan:      The patient's current anticoagulation dose is Warfarin sodium 5 mg tabs: once daily  and as directed.  The target INR is 2.0-3.0.  The next INR is due 05/13/2009.  Anticoagulation instructions were given to patient.  Results were reviewed/authorized by Ysidro Evert, Pharm D Candidate.  She was notified by Ysidro Evert, Pharm D Candidate.         Prior Anticoagulation Instructions: INR 2.0  Take 0.5 tab extra today, then resume 1 tab daily except 0.5 tab on Mondays and Wednesdays.  Recheck in 4 weeks.   Current Anticoagulation Instructions: INR: 1.8 Take 1 and 1/2 tablets ( total of 7.5mg ) today, then change dose to 1 tablet daily  except 1/2 tablet on Wednesdays Recheck in 2 weeks

## 2010-05-05 NOTE — Progress Notes (Signed)
Summary: Rf Vicodin  Phone Note Refill Request Message from:  Fax from Pharmacy  Refills Requested: Medication #1:  HYDROCODONE-ACETAMINOPHEN 5-325 MG TABS 1 by mouth up to 4 times per day as needed for pain   Dosage confirmed as above?Dosage Confirmed   Supply Requested: 120   Last Refilled: 01/25/2010  Method Requested: Telephone to Pharmacy Next Appointment Scheduled: 04-12-10 Initial call taken by: Lanier Prude, [facility]),  March 16, 2010 1:13 PM  Follow-up for Phone Call        ok to ref Follow-up by: Tresa Garter MD,  March 18, 2010 11:52 AM  Additional Follow-up for Phone Call Additional follow up Details #1::        Rx called to pharmacy Additional Follow-up by: Lanier Prude, [facility]),  March 18, 2010 12:00 PM    Prescriptions: HYDROCODONE-ACETAMINOPHEN 5-325 MG TABS (HYDROCODONE-ACETAMINOPHEN) 1 by mouth up to 4 times per day as needed for pain  #120 x 0   Entered by:   Lanier Prude, CMA(AAMA)   Authorized by:   Tresa Garter MD   Signed by:   Lanier Prude, CMA(AAMA) on 03/18/2010   Method used:   Telephoned to ...       [facility] [address] [phone]* (retail)       [address]       [place], [place]  [address]       Ph: [phone] or [phone]       Fax: [phone]   RxID:   [id number]

## 2010-05-05 NOTE — Progress Notes (Signed)
Summary: LEVOXYL DOSE  Phone Note Call from Patient   Summary of Call: See previous phone note - pt called back. She takes 1 and 1/2 tab of levoxyl daily = . Will send in new rx to pharm.  Initial call taken by: Lamar Sprinkles, CMA,  April 27, 2010 12:00 PM    New/Updated Medications: LEVOXYL 75 MCG TABS (LEVOTHYROXINE SODIUM) 1 once daily (PLEASE NOTE: INCREASED DOSE) Prescriptions: LEVOXYL 75 MCG TABS (LEVOTHYROXINE SODIUM) 1 once daily (PLEASE NOTE: INCREASED DOSE)  #90 x 3   Entered by:   Lamar Sprinkles, CMA   Authorized by:   Tresa Garter MD   Signed by:   Lamar Sprinkles, CMA on 04/27/2010   Method used:   Electronically to        [facility]* (retail)       [address] [place], [place]  [address]       Ph: [phone]       Fax: [phone]   RxID:   [id number]

## 2010-05-05 NOTE — Progress Notes (Signed)
Summary: Levoxyl ?  Phone Note From Pharmacy   Caller: [facility] Summary of Call: rec Rf req for Levoxyl . Pt's current list says Levoxyl 75 micrograms. Which is correct mg/sig??please advise Initial call taken by: Lanier Prude, [facility]),  April 26, 2010 12:01 PM  Follow-up for Phone Call        The  chart says 75 mcg Follow-up by: Tresa Garter MD,  April 26, 2010 6:11 PM  Additional Follow-up for Phone Call Additional follow up Details #1::        I spoke to pt. She states she takes 1 by mouth once daily. Additional Follow-up by: Lanier Prude, [facility]),  April 27, 2010 9:39 AM    New/Updated Medications: LEVOXYL 50 MCG TABS (LEVOTHYROXINE SODIUM) 1 by mouth once daily Prescriptions: LEVOXYL 50 MCG TABS (LEVOTHYROXINE SODIUM) 1 by mouth once daily  #90 x 2   Entered by:   Lanier Prude, CMA(AAMA)   Authorized by:   Tresa Garter MD   Signed by:   Lanier Prude, [facility]) on 04/27/2010   Method used:   Electronically to        [facility]* (retail)       [address]       [place], [place]  [address]       Ph: [phone]       Fax: [phone]   RxID:   [id number]

## 2010-05-05 NOTE — Progress Notes (Signed)
Summary: RFs  Phone Note Refill Request   Refills Requested: Medication #1:  ATIVAN 1 MG  TABS two times a day as needed   Supply Requested: 3 months  Medication #2:  METFORMIN HCL 500 MG TABS Take 1 tab twice daily   Supply Requested: 1 year  Medication #3:  PREVACID 30 MG  CPDR two times a day   Supply Requested: 1 year ATIVAN & PREVACID TO GO TO [facility]  Initial call taken by: Lamar Sprinkles, CMA,  April 08, 2009 3:08 PM  Follow-up for Phone Call        ok  Keep return office visit  Follow-up by: Tresa Garter MD,  April 09, 2009 7:48 AM  Additional Follow-up for Phone Call Additional follow up Details #1::        [facility]ed [facility] to [facility]. Additional Follow-up by: Daphane Shepherd,  April 09, 2009 1:51 PM    Prescriptions: ATIVAN 1 MG  TABS (LORAZEPAM) two times a day as needed  #180 x 1   Entered by:   Lamar Sprinkles, CMA   Authorized by:   Tresa Garter MD   Signed by:   Lamar Sprinkles, CMA on 04/09/2009   Method used:   Printed then faxed to ...       [facility]* (retail)       [address] [place], [place]  [address]       Ph: [phone]       Fax: [phone]   RxID:   [id number] LEVOXYL 50 MCG TABS (LEVOTHYROXINE SODIUM) 1 by mouth once daily BRAND MEDICALLY NECESSARY Brand medically necessary #90 x 3   Entered by:   Lamar Sprinkles, CMA   Authorized by:   Tresa Garter MD   Signed by:   Lamar Sprinkles, CMA on 04/08/2009   Method used:   Faxed to ...       [facility]* (retail)       [address] [place], [place]  [address]       Ph: [phone]       Fax: [phone]   RxID:   [id number] PREVACID 30 MG  CPDR (LANSOPRAZOLE) two times a day  #180 x 3   Entered by:   Lamar Sprinkles, CMA   Authorized by:   Tresa Garter MD   Signed by:   Lamar Sprinkles, CMA on 04/08/2009   Method used:   Faxed to ...       [facility]* (retail)       [address] [place], [place]  [address]       Ph: [phone]  Fax: [phone]   RxID:   [id number] METFORMIN HCL 500 MG TABS (METFORMIN HCL) Take 1 tab twice daily  #180 x 3   Entered by:   Lamar Sprinkles, CMA   Authorized by:   Tresa Garter MD   Signed by:   Lamar Sprinkles, CMA on 04/08/2009   Method used:   Electronically to        [facility] [phone]* (retail)       [address]       [place], [place]  [address]       Ph: [phone]       Fax: [phone]   RxID:   [id number]

## 2010-05-05 NOTE — Medication Information (Signed)
Summary: rov/eac  Anticoagulant Therapy  Managed by: Bethena Midget, RN, BSN PCP: Sonda Primes, MD Supervising MD: Myrtis Ser MD, Tinnie Gens Indication 1: Pulmonary Embolism Lab Used: [facility] [place] Site: [address] INR POC 2.6 INR RANGE 2-3  Dietary changes: no    Health status changes: no    Bleeding/hemorrhagic complications: no    Recent/future hospitalizations: no    Any changes in medication regimen? no    Recent/future dental: no  Any missed doses?: no       Is patient compliant with meds? yes       Allergies: 1)  ! Vioxx 2)  ! Codeine 3)  ! Voltaren 4)  ! Percocet 5)  ! * Calcium 6)  Enalapril Maleate  Anticoagulation Management History:      The patient is taking warfarin and comes in today for a routine follow up visit.  Positive risk factors for bleeding include an age of 75 years or older and presence of serious comorbidities.  The bleeding index is 'intermediate risk'.  Positive CHADS2 values include History of HTN and History of Diabetes.  Negative CHADS2 values include Age > 49 years old.  Her last INR was 2.7 ratio.  Anticoagulation responsible provider: Myrtis Ser MD, Tinnie Gens.  INR POC: 2.6.  Cuvette Lot#: [id number].  Exp: 09/2010.    Anticoagulation Management Assessment/Plan:      The patient's current anticoagulation dose is Warfarin sodium 5 mg tabs: once daily  and as directed.  The target INR is 2.0-3.0.  The next INR is due 08/26/2009.  Anticoagulation instructions were given to patient.  Results were reviewed/authorized by Bethena Midget, RN, BSN.  She was notified by Bethena Midget, RN, BSN.         Prior Anticoagulation Instructions: INR 3.0  Take 1/2 tablet today.  Then return to normal dosing schedule of 1/2 tablet on Wednesday and 1 tablet all other days.  Return to clinic in 4 weeks.   Current Anticoagulation Instructions: INR 2.6 Continue 5mg s everyday except 2.5mg s on Wednesdays. Recheck in 4 weeks.
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
## 2010-05-05 NOTE — Medication Information (Signed)
Summary: rov/ez  Anticoagulant Therapy  Managed by: Bethena Midget, RN, BSN PCP: Sonda Primes, MD Supervising MD: Daleen Squibb MD, Maisie Fus Indication 1: Pulmonary Embolism Lab Used: [facility] [place] Site: [address] INR POC 2.7 INR RANGE 2-3  Dietary changes: no    Health status changes: no    Bleeding/hemorrhagic complications: no    Recent/future hospitalizations: no    Any changes in medication regimen? no    Recent/future dental: no  Any missed doses?: no       Is patient compliant with meds? yes       Allergies: 1)  ! Vioxx 2)  ! Codeine 3)  ! Voltaren 4)  ! Percocet 5)  ! * Calcium 6)  Enalapril Maleate  Anticoagulation Management History:      The patient is taking warfarin and comes in today for a routine follow up visit.  Positive risk factors for bleeding include an age of 75 years or older and presence of serious comorbidities.  The bleeding index is 'intermediate risk'.  Positive CHADS2 values include History of HTN and History of Diabetes.  Negative CHADS2 values include Age > 61 years old.  Her last INR was 2.7 ratio.  Anticoagulation responsible provider: Daleen Squibb MD, Maisie Fus.  INR POC: 2.7.  Cuvette Lot#: [id number].  Exp: 07/2010.    Anticoagulation Management Assessment/Plan:      The patient's current anticoagulation dose is Warfarin sodium 5 mg tabs: once daily  and as directed.  The target INR is 2.0-3.0.  The next INR is due 06/03/2009.  Anticoagulation instructions were given to patient.  Results were reviewed/authorized by Bethena Midget, RN, BSN.  She was notified by Bethena Midget, RN, BSN.         Prior Anticoagulation Instructions: INR: 1.8 Take 1 and 1/2 tablets ( total of 7.5mg ) today, then change dose to 1 tablet daily except 1/2 tablet on Wednesdays Recheck in 2 weeks  Current Anticoagulation Instructions: INR 2.7 Continue 1 pill everyday except 1/2 pill on Wednesdays. Recheck in 3 weeks.

## 2010-05-05 NOTE — Progress Notes (Signed)
Summary: Rf lorazepam  Phone Note From Pharmacy   [facility]er: [facility] Summary of Call: rec rf req for Lorazepam 1 mg  1 by mouth two times a day #180. Ok to Rf? Initial call taken by: Lanier Prude, [facility]),  March 31, 2010 12:04 PM  Follow-up for Phone Call        ok x 3 Follow-up by: Tresa Garter MD,  March 31, 2010 4:05 PM  Additional Follow-up for Phone Call Additional follow up Details #1::        Pharmacist called. per pharm she can only put 1 additonal Rf because it will expire. Additional Follow-up by: Lanier Prude, [facility]),  March 31, 2010 4:43 PM    Prescriptions: ATIVAN 1 MG  TABS (LORAZEPAM) two times a day as needed  #180 x 1   Entered by:   Lanier Prude, [facility])   Authorized by:   Tresa Garter MD   Signed by:   Lanier Prude, CMA(AAMA) on 03/31/2010   Method used:   Telephoned to ...       [facility]* (retail)       [address] [place], [place]  [address]       Ph: [phone]       Fax: [phone]   RxID:   [id number]

## 2010-05-05 NOTE — Progress Notes (Signed)
Summary: Allopurinol  Phone Note Call from Patient Call back at [facility] Phone [phone]   Summary of Call: Pt c/o pain the last 2 nights. The rest of the message was unclear. Need to call pt for info.  Initial call taken by: Lamar Sprinkles, CMA,  December 21, 2009 11:20 AM  Follow-up for Phone Call        Spoke w/pt. She started allopurinol recently and thinks that this is the cause of her pain. C/o pain in hips and legs - worse at night and disturbing her sleep. Please advise.  Follow-up by: Lamar Sprinkles, CMA,  December 21, 2009 3:20 PM  Additional Follow-up for Phone Call Additional follow up Details #1::        Hold it x 1 wk and see if pain resolved Additional Follow-up by: Tresa Garter MD,  December 21, 2009 5:24 PM    Additional Follow-up for Phone Call Additional follow up Details #2::    Pt informed  Follow-up by: Lamar Sprinkles, CMA,  December 22, 2009 4:42 PM  New/Updated Medications: ALLOPURINOL 100 MG TABS (ALLOPURINOL) 1 by mouth once daily for gout prevention-HOLD

## 2010-05-05 NOTE — Medication Information (Signed)
Summary: Prior Auth & DENIED for Lansoprazole/[employer]  Prior Auth & DENIED for Lansoprazole/[employer]   Imported By: Sherian Rein 04/15/2009 11:42:39  _____________________________________________________________________  External Attachment:    Type:   Image     Comment:   External Document

## 2010-05-05 NOTE — Progress Notes (Signed)
Summary: Rf Promethazine  Phone Note From Pharmacy   [facility] [phone]* Summary of Call: rec rf request Promethazine 25mg  1 by mouth qid #60. med is not on list. ok to RF? Initial call taken by: Lanier Prude, [facility]),  February 28, 2010 8:42 AM  Follow-up for Phone Call        ok to ref Follow-up by: Tresa Garter MD,  February 28, 2010 5:43 PM    New/Updated Medications: PROMETHAZINE HCL 25 MG TABS (PROMETHAZINE HCL) 1 four times a day as needed Prescriptions: PROMETHAZINE HCL 25 MG TABS (PROMETHAZINE HCL) 1 four times a day as needed  #60 x 1   Entered by:   Lamar Sprinkles, CMA   Authorized by:   Tresa Garter MD   Signed by:   Lamar Sprinkles, CMA on 02/28/2010   Method used:   Electronically to        [facility]  [address] [phone]* (retail)       [address]       [place], [place]  [address]       Ph: [phone] or [phone]       Fax: [phone]   RxID:   [id number]
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
## 2010-05-05 NOTE — Medication Information (Signed)
Summary: rov/tm  Anticoagulant Therapy  Managed by: Eda Keys, PharmD PCP: Sonda Primes, MD Supervising MD: Tenny Craw MD, Gunnar Fusi Indication 1: Pulmonary Embolism Lab Used: [facility] [place] Site: [address] INR POC 3.3 INR RANGE 2-3  Dietary changes: no    Health status changes: no    Bleeding/hemorrhagic complications: no    Recent/future hospitalizations: no    Any changes in medication regimen? no    Recent/future dental: no  Any missed doses?: no       Is patient compliant with meds? yes      Comments: 1 year on coumadin will be in June, f/u with Dr. Posey Rea in June regarding pt coming off of coumadin.  Allergies: 1)  ! Vioxx 2)  ! Codeine 3)  ! Voltaren 4)  ! Percocet 5)  ! * Calcium 6)  Enalapril Maleate  Anticoagulation Management History:      The patient is taking warfarin and comes in today for a routine follow up visit.  Positive risk factors for bleeding include an age of 36 years or older and presence of serious comorbidities.  The bleeding index is 'intermediate risk'.  Positive CHADS2 values include History of HTN and History of Diabetes.  Negative CHADS2 values include Age > 50 years old.  Her last INR was 2.7 ratio.  Anticoagulation responsible provider: Tenny Craw MD, Gunnar Fusi.  INR POC: 3.3.  Cuvette Lot#: [id number].  Exp: 11/2010.    Anticoagulation Management Assessment/Plan:      The patient's current anticoagulation dose is Warfarin sodium 5 mg tabs: once daily  and as directed.  The target INR is 2.0-3.0.  The next INR is due 09/16/2009.  Anticoagulation instructions were given to patient.  Results were reviewed/authorized by Eda Keys, PharmD.  She was notified by Eda Keys.         Prior Anticoagulation Instructions: INR 2.6 Continue 5mg s everyday except 2.5mg s on Wednesdays. Recheck in 4 weeks.   Current Anticoagulation Instructions: INR 3.3  Do NOT take coumadin today.  Then return to taking 1/2 tablet on  Wednesday and 1 tablet all other days.  Return to clinic in 3 weeks.

## 2010-05-05 NOTE — Medication Information (Signed)
Summary: [facility]  Anticoagulant Therapy  Managed by: Inactive PCP: Sonda Primes, MD Supervising MD: Gala Romney MD, Reuel Boom Indication 1: Pulmonary Embolism Lab Used: [facility] [place] Site: [address] INR RANGE 2-3          Comments: Dr Jinny Sanders on 09/08/09 discontinued coumadin at pt request.   Allergies: 1)  ! Vioxx 2)  ! Codeine 3)  ! Voltaren 4)  ! Percocet 5)  ! * Calcium 6)  Enalapril Maleate  Anticoagulation Management History:      Positive risk factors for bleeding include an age of 75 years or older and presence of serious comorbidities.  The bleeding index is 'intermediate risk'.  Positive CHADS2 values include History of HTN and History of Diabetes.  Negative CHADS2 values include Age > 19 years old.  Her last INR was 2.7 ratio.  Anticoagulation responsible provider: Bensimhon MD, Reuel Boom.  Exp: 11/2010.    Anticoagulation Management Assessment/Plan:      The target INR is 2.0-3.0.  The next INR is due 09/16/2009.  Anticoagulation instructions were given to patient.  Results were reviewed/authorized by Inactive.         Prior Anticoagulation Instructions: INR 3.3  Do NOT take coumadin today.  Then return to taking 1/2 tablet on Wednesday and 1 tablet all other days.  Return to clinic in 3 weeks.

## 2010-05-05 NOTE — Letter (Signed)
Summary: CMN for Diabetes Supplies/[place] [employer]  CMN for Diabetes Supplies/[place] [employer]   Imported By: Sherian Rein 01/10/2010 12:25:54  _____________________________________________________________________  External Attachment:    Type:   Image     Comment:   External Document

## 2010-05-05 NOTE — Medication Information (Signed)
Summary: Diabetes Supply/[facility] [employer]  Diabetes Supply/[employer]   Imported By: Sherian Rein 05/05/2009 10:15:22  _____________________________________________________________________  External Attachment:    Type:   Image     Comment:   External Document
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
## 2010-05-05 NOTE — Progress Notes (Signed)
Summary: Rf Temazepam  Phone Note Refill Request Message from:  Pharmacy  Refills Requested: Medication #1:  TEMAZEPAM 15 MG CAPS 1 at bedtime   Dosage confirmed as above?Dosage Confirmed   Supply Requested: 90 Right Source   Method Requested: Telephone to Pharmacy Initial call taken by: Lanier Prude, [facility]),  November 10, 2009 12:01 PM  Follow-up for Phone Call        ok x1 Follow-up by: Tresa Garter MD,  November 10, 2009 1:33 PM  Additional Follow-up for Phone Call Additional follow up Details #1::        Rx called to pharmacy Additional Follow-up by: Lanier Prude, [facility]),  November 10, 2009 5:17 PM

## 2010-05-05 NOTE — Medication Information (Signed)
Summary: rov/tm  Anticoagulant Therapy  Managed by: Eda Keys, PharmD PCP: Sonda Primes, MD Supervising MD: Jens Som MD, Arlys John Indication 1: Pulmonary Embolism Lab Used: [facility] [place] Site: [address] INR RANGE 2-3  Dietary changes: no    Health status changes: no    Bleeding/hemorrhagic complications: no    Recent/future hospitalizations: no    Any changes in medication regimen? no    Recent/future dental: no  Any missed doses?: no       Is patient compliant with meds? yes       Allergies: 1)  ! Vioxx 2)  ! Codeine 3)  ! Voltaren 4)  ! Percocet 5)  ! * Calcium 6)  Enalapril Maleate  Anticoagulation Management History:      The patient is taking warfarin and comes in today for a routine follow up visit.  Positive risk factors for bleeding include an age of 75 years or older and presence of serious comorbidities.  The bleeding index is 'intermediate risk'.  Positive CHADS2 values include History of HTN and History of Diabetes.  Negative CHADS2 values include Age > 79 years old.  Her last INR was 2.7 ratio.  Anticoagulation responsible provider: Jens Som MD, Arlys John.  Cuvette Lot#: [id number].  Exp: 08/2010.    Anticoagulation Management Assessment/Plan:      The patient's current anticoagulation dose is Warfarin sodium 5 mg tabs: once daily  and as directed.  The target INR is 2.0-3.0.  The next INR is due 07/29/2009.  Anticoagulation instructions were given to patient.  Results were reviewed/authorized by Eda Keys, PharmD.  She was notified by Eda Keys.         Prior Anticoagulation Instructions: INR 2.1 Continue 1pill everyday except 1/2 pill on Wednesdays. Recheck in 4 weeks.   Current Anticoagulation Instructions: INR 3.0  Take 1/2 tablet today.  Then return to normal dosing schedule of 1/2 tablet on Wednesday and 1 tablet all other days.  Return to clinic in 4 weeks.
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
## 2010-05-05 NOTE — Progress Notes (Signed)
Summary: Rf Hydroco/Acetamin  Phone Note Refill Request Message from:  Fax from Pharmacy  Refills Requested: Medication #1:  HYDROCODONE-ACETAMINOPHEN 5-325 MG TABS 1 by mouth up to 4 times per day as needed for pain   Dosage confirmed as above?Dosage Confirmed   Supply Requested: 120   Last Refilled: 11/25/2009  Method Requested: Telephone to Pharmacy Next Appointment Scheduled: 04/2010 Initial call taken by: Lanier Prude, [facility]),  January 24, 2010 11:25 AM  Follow-up for Phone Call        ok to ref Follow-up by: Tresa Garter MD,  January 24, 2010 5:51 PM  Additional Follow-up for Phone Call Additional follow up Details #1::        Rx called to pharmacy Additional Follow-up by: Lanier Prude, [facility]),  January 25, 2010 8:23 AM    Prescriptions: HYDROCODONE-ACETAMINOPHEN 5-325 MG TABS (HYDROCODONE-ACETAMINOPHEN) 1 by mouth up to 4 times per day as needed for pain  #120 x 0   Entered by:   Lanier Prude, [facility])   Authorized by:   Tresa Garter MD   Signed by:   Lanier Prude, CMA(AAMA) on 01/25/2010   Method used:   Telephoned to ...       [facility] [address] [phone]* (retail)       [address]       [place], [place]  [address]       Ph: [phone] or [phone]       Fax: [phone]   RxID:   [id number]

## 2010-05-05 NOTE — Progress Notes (Signed)
Summary: Lansoprazole PA  Phone Note From Pharmacy   Summary of Call: Rec'd PA paperwork for Lansoprazole 30. Filled out paperwork and faxed to insurance company after MD signed. Will wait for insurance company reply. Initial call taken by: Lucious Groves,  April 13, 2009 10:56 AM  Follow-up for Phone Call        Rec'd fax that prescription is denied. Please advise new prescription request an appeal. Follow-up by: Lucious Groves,  April 14, 2009 12:18 PM  Additional Follow-up for Phone Call Additional follow up Details #1::        What is covered? Additional Follow-up by: Tresa Garter MD,  April 15, 2009 7:37 AM    Additional Follow-up for Phone Call Additional follow up Details #2::    Ins will cover omeprazole as tier 1 and if patient has tried and failed that prescription then we can try to get nexium for her. Please advise. Follow-up by: Lucious Groves,  April 15, 2009 8:25 AM  Additional Follow-up for Phone Call Additional follow up Details #3:: Details for Additional Follow-up Action Taken: OK Omeprazole  Georgina Quint Nemiah Bubar MD,  April 15, 2009 1:31 PM  Omeprazole sent to pharmacy. Additional Follow-up by: Lucious Groves,  April 16, 2009 10:18 AM  New/Updated Medications: OMEPRAZOLE 40 MG CPDR (OMEPRAZOLE) 1 by mouth qam for indigestion Prescriptions: OMEPRAZOLE 40 MG CPDR (OMEPRAZOLE) 1 by mouth qam for indigestion  #90 x 3   Entered and Authorized by:   Tresa Garter MD   Signed by:   Lucious Groves on 04/16/2009   Method used:   Electronically to        [facility] [address] [phone]* (retail)       [address]       [place], [place]  [address]       Ph: [phone] or [phone]       Fax: [phone]   RxID:   [id number]

## 2010-05-05 NOTE — Progress Notes (Signed)
  Phone Note Call from Patient   Summary of Call: Pt c/o "erosions" that are "knots" on her face.  She is req rx for antibiotic. left mess to call office back for more info Initial call taken by: Lamar Sprinkles, CMA,  October 19, 2009 11:15 AM  Follow-up for Phone Call        She shoud be seen - OV w/any MD pls Follow-up by: Tresa Garter MD,  October 19, 2009 1:18 PM  Additional Follow-up for Phone Call Additional follow up Details #1::        Scheduled for office visit w/Norins today Additional Follow-up by: Lamar Sprinkles, CMA,  October 19, 2009 2:40 PM

## 2010-05-05 NOTE — Medication Information (Signed)
Summary: rov/tm  Anticoagulant Therapy  Managed by: Bethena Midget, RN, BSN PCP: Sonda Primes, MD Supervising MD: Tenny Craw MD, Gunnar Fusi Indication 1: Pulmonary Embolism Lab Used: [facility] [place] Site: [address] INR POC 2.1 INR RANGE 2-3  Dietary changes: no    Health status changes: no    Bleeding/hemorrhagic complications: no    Recent/future hospitalizations: no    Any changes in medication regimen? yes       Details: Yesterday Dr Posey Rea restarted Benicar 40mg s. Also Pristiq d/c'd and Cymbalta started.   Recent/future dental: no  Any missed doses?: no       Is patient compliant with meds? yes       Allergies: 1)  ! Vioxx 2)  ! Codeine 3)  ! Voltaren 4)  ! Percocet 5)  ! * Calcium 6)  Enalapril Maleate  Anticoagulation Management History:      The patient is taking warfarin and comes in today for a routine follow up visit.  Positive risk factors for bleeding include an age of 75 years or older and presence of serious comorbidities.  The bleeding index is 'intermediate risk'.  Positive CHADS2 values include History of HTN and History of Diabetes.  Negative CHADS2 values include Age > 75 years old.  Her last INR was 2.7 ratio.  Anticoagulation responsible provider: Tenny Craw MD, Gunnar Fusi.  INR POC: 2.1.  Cuvette Lot#: [id number].  Exp: 08/2010.    Anticoagulation Management Assessment/Plan:      The patient's current anticoagulation dose is Warfarin sodium 5 mg tabs: once daily  and as directed.  The target INR is 2.0-3.0.  The next INR is due 07/01/2009.  Anticoagulation instructions were given to patient.  Results were reviewed/authorized by Bethena Midget, RN, BSN.  She was notified by Bethena Midget, RN, BSN.         Prior Anticoagulation Instructions: INR 2.7 Continue 1 pill everyday except 1/2 pill on Wednesdays. Recheck in 3 weeks.   Current Anticoagulation Instructions: INR 2.1 Continue 1pill everyday except 1/2 pill on Wednesdays. Recheck in 4 weeks.

## 2010-05-11 NOTE — Letter (Signed)
Summary: [facility]   Imported By: Sherian Rein 05/05/2010 11:23:15  _____________________________________________________________________  External Attachment:    Type:   Image     Comment:   External Document

## 2010-05-11 NOTE — Progress Notes (Signed)
Summary: Rf Hydroco/APAP  Phone Note Refill Request Message from:  Fax from Pharmacy  Refills Requested: Medication #1:  HYDROCODONE-ACETAMINOPHEN 5-325 MG TABS 1 by mouth up to 4 times per day as needed for pain   Dosage confirmed as above?Dosage Confirmed   Supply Requested: 120   [facility]   Method Requested: Telephone to Pharmacy Next Appointment Scheduled: 07-15-10 Initial call taken by: Lanier Prude, [facility]),  May 02, 2010 2:30 PM  Follow-up for Phone Call        ok to ref Follow-up by: Tresa Garter MD,  May 02, 2010 6:16 PM    Prescriptions: HYDROCODONE-ACETAMINOPHEN 5-325 MG TABS (HYDROCODONE-ACETAMINOPHEN) 1 by mouth up to 4 times per day as needed for pain  #120 x 0   Entered by:   Lamar Sprinkles, CMA   Authorized by:   Tresa Garter MD   Signed by:   Lamar Sprinkles, CMA on 05/02/2010   Method used:   Telephoned to ...       [facility] [address] [phone]* (retail)       [address]       [place], [place]  [address]       Ph: [phone] or [phone]       Fax: [phone]   RxID:   [id number]

## 2010-06-17 ENCOUNTER — Telehealth: Payer: Self-pay | Admitting: Internal Medicine

## 2010-06-21 NOTE — Progress Notes (Signed)
Summary: Rf Hydroco/APAP  Phone Note Refill Request Message from:  Fax from Pharmacy  Refills Requested: Medication #1:  HYDROCODONE-ACETAMINOPHEN 5-325 MG TABS 1 by mouth up to 4 times per day as needed for pain   Dosage confirmed as above?Dosage Confirmed   Supply Requested: 60   Last Refilled: 05/02/2010  Method Requested: Telephone to Pharmacy Initial call taken by: Lanier Prude, [facility]),  June 17, 2010 3:16 PM  Follow-up for Phone Call        ok to ref Follow-up by: Tresa Garter MD,  June 17, 2010 6:02 PM    Prescriptions: HYDROCODONE-ACETAMINOPHEN 5-325 MG TABS (HYDROCODONE-ACETAMINOPHEN) 1 by mouth up to 4 times per day as needed for pain  #120 x 0   Entered by:   Lamar Sprinkles, CMA   Authorized by:   Tresa Garter MD   Signed by:   Lamar Sprinkles, CMA on 06/17/2010   Method used:   Telephoned to ...       [facility] [address] [phone]* (retail)       [address]       [place], [place]  [address]       Ph: [phone] or [phone]       Fax: [phone]   RxID:   [id number]

## 2010-07-01 ENCOUNTER — Telehealth: Payer: Self-pay | Admitting: *Deleted

## 2010-07-01 MED ORDER — METFORMIN HCL 500 MG PO TABS: 500.0000 mg | ORAL_TABLET | Freq: Two times a day (BID) | ORAL | Status: DC

## 2010-07-01 NOTE — Telephone Encounter (Signed)
rf sent 

## 2010-07-10 LAB — GLUCOSE, CAPILLARY
Glucose-Capillary: 118 mg/dL — ABNORMAL HIGH (ref 70–99)
Glucose-Capillary: 125 mg/dL — ABNORMAL HIGH (ref 70–99)
Glucose-Capillary: 125 mg/dL — ABNORMAL HIGH (ref 70–99)
Glucose-Capillary: 125 mg/dL — ABNORMAL HIGH (ref 70–99)
Glucose-Capillary: 132 mg/dL — ABNORMAL HIGH (ref 70–99)
Glucose-Capillary: 133 mg/dL — ABNORMAL HIGH (ref 70–99)

## 2010-07-10 LAB — COMPREHENSIVE METABOLIC PANEL
ALT: 23 U/L (ref 0–35)
AST: 33 U/L (ref 0–37)
Albumin: 3.4 g/dL — ABNORMAL LOW (ref 3.5–5.2)
Alkaline Phosphatase: 76 U/L (ref 39–117)
BUN: 24 mg/dL — ABNORMAL HIGH (ref 6–23)
CO2: 24 mEq/L (ref 19–32)
Calcium: 9.9 mg/dL (ref 8.4–10.5)
Chloride: 102 mEq/L (ref 96–112)
Creatinine, Ser: 1.24 mg/dL — ABNORMAL HIGH (ref 0.4–1.2)
GFR calc Af Amer: 51 mL/min — ABNORMAL LOW (ref >60)
GFR calc non Af Amer: 42 mL/min — ABNORMAL LOW (ref >60)
Glucose, Bld: 107 mg/dL — ABNORMAL HIGH (ref 70–99)
Potassium: 4.5 mEq/L (ref 3.5–5.1)
Sodium: 136 mEq/L (ref 135–145)
Total Bilirubin: 1.1 mg/dL (ref 0.3–1.2)
Total Protein: 8.4 g/dL — ABNORMAL HIGH (ref 6.0–8.3)

## 2010-07-10 LAB — CBC
HCT: 32.5 % — ABNORMAL LOW (ref 36.0–46.0)
HCT: 33 % — ABNORMAL LOW (ref 36.0–46.0)
HCT: 35.5 % — ABNORMAL LOW (ref 36.0–46.0)
HCT: 35.9 % — ABNORMAL LOW (ref 36.0–46.0)
Hemoglobin: 11.1 g/dL — ABNORMAL LOW (ref 12.0–15.0)
Hemoglobin: 12 g/dL (ref 12.0–15.0)
MCHC: 33.9 g/dL (ref 30.0–36.0)
MCHC: 34.2 g/dL (ref 30.0–36.0)
MCV: 91.7 fL (ref 78.0–100.0)
MCV: 91.7 fL (ref 78.0–100.0)
MCV: 92.1 fL (ref 78.0–100.0)
MCV: 92.6 fL (ref 78.0–100.0)
Platelets: 372 10*3/uL (ref 150–400)
Platelets: 425 10*3/uL — ABNORMAL HIGH (ref 150–400)
RBC: 3.55 MIL/uL — ABNORMAL LOW (ref 3.87–5.11)
RBC: 3.84 MIL/uL — ABNORMAL LOW (ref 3.87–5.11)
RBC: 3.91 MIL/uL (ref 3.87–5.11)
RDW: 14.4 % (ref 11.5–15.5)
WBC: 7.6 10*3/uL (ref 4.0–10.5)
WBC: 7.9 10*3/uL (ref 4.0–10.5)
WBC: 8.6 10*3/uL (ref 4.0–10.5)

## 2010-07-10 LAB — PROTIME-INR
INR: 1.1 (ref 0.00–1.49)
INR: 1.1 (ref 0.00–1.49)
Prothrombin Time: 14.8 seconds (ref 11.6–15.2)
Prothrombin Time: 15.6 seconds — ABNORMAL HIGH (ref 11.6–15.2)

## 2010-07-10 LAB — DIFFERENTIAL
Basophils Absolute: 0 10*3/uL (ref 0.0–0.1)
Basophils Relative: 0 % (ref 0–1)
Eosinophils Absolute: 0.8 10*3/uL — ABNORMAL HIGH (ref 0.0–0.7)
Eosinophils Relative: 9 % — ABNORMAL HIGH (ref 0–5)
Lymphocytes Relative: 19 % (ref 12–46)
Lymphs Abs: 1.6 10*3/uL (ref 0.7–4.0)
Monocytes Absolute: 0.8 10*3/uL (ref 0.1–1.0)
Monocytes Relative: 9 % (ref 3–12)
Neutro Abs: 5.3 10*3/uL (ref 1.7–7.7)
Neutrophils Relative %: 62 % (ref 43–77)

## 2010-07-10 LAB — BASIC METABOLIC PANEL
BUN: 18 mg/dL (ref 6–23)
Chloride: 98 mEq/L (ref 96–112)
GFR calc Af Amer: >60 mL/min (ref >60)
GFR calc non Af Amer: 56 mL/min — ABNORMAL LOW (ref >60)
Potassium: 4 mEq/L (ref 3.5–5.1)
Sodium: 136 mEq/L (ref 135–145)

## 2010-07-10 LAB — APTT: aPTT: 32 seconds (ref 24–37)

## 2010-07-10 LAB — HEPARIN LEVEL (UNFRACTIONATED): Heparin Unfractionated: 0.51 IU/mL (ref 0.30–0.70)

## 2010-07-11 ENCOUNTER — Other Ambulatory Visit: Payer: Self-pay | Admitting: Internal Medicine

## 2010-07-11 ENCOUNTER — Other Ambulatory Visit (INDEPENDENT_AMBULATORY_CARE_PROVIDER_SITE_OTHER): Payer: 59

## 2010-07-11 DIAGNOSIS — M79609 Pain in unspecified limb: Secondary | ICD-10-CM

## 2010-07-11 DIAGNOSIS — R7309 Other abnormal glucose: Secondary | ICD-10-CM

## 2010-07-11 DIAGNOSIS — T887XXA Unspecified adverse effect of drug or medicament, initial encounter: Secondary | ICD-10-CM

## 2010-07-11 LAB — BASIC METABOLIC PANEL
BUN: 17 mg/dL (ref 6–23)
CO2: 26 mEq/L (ref 19–32)
Calcium: 9.2 mg/dL (ref 8.4–10.5)
GFR: 103.64 mL/min (ref >60.00)
Glucose, Bld: 109 mg/dL — ABNORMAL HIGH (ref 70–99)
Sodium: 136 mEq/L (ref 135–145)

## 2010-07-11 LAB — HEPATIC FUNCTION PANEL
ALT: 21 U/L (ref 0–35)
Albumin: 3.4 g/dL — ABNORMAL LOW (ref 3.5–5.2)
Alkaline Phosphatase: 73 U/L (ref 39–117)
Bilirubin, Direct: 0 mg/dL (ref 0.0–0.3)
Total Protein: 6.7 g/dL (ref 6.0–8.3)

## 2010-07-13 LAB — GLUCOSE, CAPILLARY
Glucose-Capillary: 134 mg/dL — ABNORMAL HIGH (ref 70–99)
Glucose-Capillary: 143 mg/dL — ABNORMAL HIGH (ref 70–99)

## 2010-07-15 ENCOUNTER — Ambulatory Visit (INDEPENDENT_AMBULATORY_CARE_PROVIDER_SITE_OTHER)
Admission: RE | Admit: 2010-07-15 | Discharge: 2010-07-15 | Disposition: A | Discharge status: Home / Self Care | Payer: 59 | Source: Ambulatory Visit | Attending: Internal Medicine | Admitting: Internal Medicine

## 2010-07-15 ENCOUNTER — Telehealth: Payer: Self-pay | Admitting: Internal Medicine

## 2010-07-15 ENCOUNTER — Other Ambulatory Visit: Payer: 59

## 2010-07-15 ENCOUNTER — Encounter: Payer: Self-pay | Admitting: Internal Medicine

## 2010-07-15 ENCOUNTER — Ambulatory Visit (INDEPENDENT_AMBULATORY_CARE_PROVIDER_SITE_OTHER): Payer: 59 | Admitting: Internal Medicine

## 2010-07-15 ENCOUNTER — Other Ambulatory Visit (INDEPENDENT_AMBULATORY_CARE_PROVIDER_SITE_OTHER): Payer: 59 | Admitting: Internal Medicine

## 2010-07-15 DIAGNOSIS — Z0000 Encounter for general adult medical examination without abnormal findings: Secondary | ICD-10-CM

## 2010-07-15 DIAGNOSIS — R609 Edema, unspecified: Secondary | ICD-10-CM

## 2010-07-15 DIAGNOSIS — R7309 Other abnormal glucose: Secondary | ICD-10-CM

## 2010-07-15 DIAGNOSIS — M6281 Muscle weakness (generalized): Secondary | ICD-10-CM

## 2010-07-15 DIAGNOSIS — M1990 Unspecified osteoarthritis, unspecified site: Secondary | ICD-10-CM

## 2010-07-15 DIAGNOSIS — R29898 Other symptoms and signs involving the musculoskeletal system: Secondary | ICD-10-CM

## 2010-07-15 DIAGNOSIS — F3289 Other specified depressive episodes: Secondary | ICD-10-CM

## 2010-07-15 DIAGNOSIS — F329 Major depressive disorder, single episode, unspecified: Secondary | ICD-10-CM

## 2010-07-15 LAB — URINALYSIS, ROUTINE W REFLEX MICROSCOPIC
Nitrite: NEGATIVE
Specific Gravity, Urine: 1.015 (ref 1.000–1.030)
Total Protein, Urine: NEGATIVE
Urine Glucose: NEGATIVE
pH: 6 (ref 5.0–8.0)

## 2010-07-15 MED ORDER — METFORMIN HCL 500 MG PO TABS: 500.0000 mg | ORAL_TABLET | Freq: Two times a day (BID) | ORAL | Status: DC

## 2010-07-15 MED ORDER — CIPROFLOXACIN HCL 500 MG PO TABS: 500.0000 mg | ORAL_TABLET | Freq: Two times a day (BID) | ORAL | Status: DC

## 2010-07-15 MED ORDER — IBUPROFEN 600 MG PO TABS: 600.0000 mg | ORAL_TABLET | Freq: Two times a day (BID) | ORAL | Status: DC | PRN

## 2010-07-15 MED ORDER — LEVOTHYROXINE SODIUM 75 MCG PO TABS: 75.0000 ug | ORAL_TABLET | Freq: Every day | ORAL | Status: DC

## 2010-07-15 NOTE — Progress Notes (Signed)
  Subjective:    Patient ID: Jessica [name], female    DOB: [birth year], 77 y.o.   MRN: [id number]  HPI  The patient presents for a follow-up of  chronic hypertension, chronic dyslipidemia, type 2 diabetes controlled with medicines  C/o a fall the other day and L leg and foot feeling strange  Review of Systems  Constitutional: Positive for activity change (I'm lazy). Negative for fever and appetite change.  HENT: Negative for congestion and facial swelling.   Eyes: Negative for itching.  Respiratory: Negative for cough.   Cardiovascular: Negative for chest pain.  Genitourinary: Negative for flank pain.  Musculoskeletal: Positive for back pain. Negative for joint swelling.  Neurological: Positive for weakness (? L foot and lower leg) and numbness. Negative for dizziness and headaches.  Hematological: Negative for adenopathy. Does not bruise/bleed easily.  Psychiatric/Behavioral: Negative for suicidal ideas and confusion. Behavioral problem: apathetic and depressed. The patient is nervous/anxious.       Wt Readings from Last 3 Encounters:  07/15/10 257 lb (116.574 kg)  04/12/10 253 lb (114.76 kg)  02/17/10 255 lb (115.667 kg)    Objective:   Physical Exam  Constitutional: She appears well-developed. No distress (Obese).       Obese  HENT:  Head: Normocephalic.  Right Ear: External ear normal.  Left Ear: External ear normal.  Nose: Nose normal.  Mouth/Throat: Oropharynx is clear and moist.  Eyes: Conjunctivae are normal. Pupils are equal, round, and reactive to light. Right eye exhibits no discharge. Left eye exhibits no discharge.  Neck: Normal range of motion. Neck supple. No JVD present. No tracheal deviation present. No thyromegaly present.  Cardiovascular: Normal rate, regular rhythm and normal heart sounds.   Pulmonary/Chest: No stridor. No respiratory distress. She has no wheezes.  Abdominal: Soft. Bowel sounds are normal. She exhibits no distension and no mass. There  is no tenderness. There is no rebound and no guarding.  Musculoskeletal: She exhibits no edema and no tenderness.  Lymphadenopathy:    She has no cervical adenopathy.  Neurological: She displays normal reflexes. No cranial nerve deficit. She exhibits normal muscle tone. Coordination normal.  Skin: No rash noted. No erythema.  Psychiatric: Her behavior is normal. Judgment and thought content normal.       sad        Lab Results  Component Value Date   WBC 6.6 01/26/2009   HGB 13.9 01/26/2009   HGB TRACE-INTACT 01/26/2009   HCT 40.6 01/26/2009   PLT 341.0 01/26/2009   CHOL 223* 12/02/2009   TRIG 223.0* 12/02/2009   HDL 43.30 12/02/2009   LDLDIRECT 159.2 12/02/2009   ALT 21 07/11/2010   AST 20 07/11/2010   NA 136 07/11/2010   K 4.1 07/11/2010   CL 100 07/11/2010   CREATININE 0.6 07/11/2010   BUN 17 07/11/2010   CO2 26 07/11/2010   TSH 4.07 09/01/2009   INR 2.7 ratio* 10/13/2008   HGBA1C 6.8* 07/11/2010   MICROALBUR 4.5* 12/02/2009     Assessment & Plan:  Weakness of left leg Possible radiculopathy L leg Will xray  LS spine Will start NSAIDs x 1-2 wks  OSTEOARTHRITIS NSAIDs - use with caution  ABNORMAL GLUCOSE NEC Will watch A1c Loose wt  DEPRESSION Grieving. Discussed.  EDEMA Resolved. Cont Rx.

## 2010-07-15 NOTE — Assessment & Plan Note (Signed)
Will watch A1c Loose wt

## 2010-07-15 NOTE — Assessment & Plan Note (Signed)
NSAIDs - use with caution

## 2010-07-15 NOTE — Assessment & Plan Note (Signed)
Resolved. Cont Rx.

## 2010-07-15 NOTE — Telephone Encounter (Signed)
Jessica [name] , please, inform the patient:  the labs are OK, except for UTI. Take Cipro  Please, keep  next office visit appointment.   Thank you !

## 2010-07-15 NOTE — Assessment & Plan Note (Signed)
Grieving Discussed 

## 2010-07-15 NOTE — Assessment & Plan Note (Signed)
Possible radiculopathy L leg Will xray  LS spine Will start NSAIDs x 1-2 wks

## 2010-07-17 ENCOUNTER — Encounter: Payer: Self-pay | Admitting: Internal Medicine

## 2010-07-18 ENCOUNTER — Telehealth: Payer: Self-pay | Admitting: Internal Medicine

## 2010-07-18 NOTE — Telephone Encounter (Signed)
Left mess for patient to call back.  

## 2010-07-18 NOTE — Telephone Encounter (Signed)
Cipro was given

## 2010-07-22 MED ORDER — CIPROFLOXACIN HCL 500 MG PO TABS: 500.0000 mg | ORAL_TABLET | Freq: Two times a day (BID) | ORAL | Status: DC

## 2010-07-22 NOTE — Telephone Encounter (Signed)
Pt informed/rx sent to local

## 2010-08-09 ENCOUNTER — Other Ambulatory Visit: Payer: Self-pay | Admitting: Internal Medicine

## 2010-08-09 NOTE — Telephone Encounter (Signed)
OK to fill this prescription with additional refills x2 Thank you!  

## 2010-08-09 NOTE — Telephone Encounter (Signed)
Ok to Rf? 

## 2010-08-10 ENCOUNTER — Telehealth: Payer: Self-pay | Admitting: *Deleted

## 2010-08-10 NOTE — Telephone Encounter (Signed)
OK to fill this prescription with additional refills x1 Thank you!  

## 2010-08-10 NOTE — Telephone Encounter (Signed)
rec rf req for Hydroco/APAP 08-3249 po qid # 120... Last filled 06-17-10  Ok to Rf?

## 2010-08-11 ENCOUNTER — Telehealth: Payer: Self-pay | Admitting: *Deleted

## 2010-08-11 MED ORDER — HYDROCODONE-ACETAMINOPHEN 5-325 MG PO TABS: 1.0000 | ORAL_TABLET | Freq: Four times a day (QID) | ORAL | Status: DC | PRN

## 2010-08-11 NOTE — Telephone Encounter (Signed)
OK to fill this prescription with additional refills x1 Thank you!  

## 2010-08-11 NOTE — Telephone Encounter (Signed)
rf phoned in 

## 2010-08-11 NOTE — Telephone Encounter (Signed)
Rec rf req for Hydroco/APAP 5-325 1 po qid......Marland KitchenMarland Kitchen# 120. Last filled 06-17-10. Ok to Rf??

## 2010-08-15 ENCOUNTER — Telehealth: Payer: Self-pay | Admitting: *Deleted

## 2010-08-15 NOTE — Telephone Encounter (Signed)
OK to fill this prescription with additional refills x 3 on both  Venlafaxin is different from Cymbalta. We can switch if Cymbalta is too $$$ Thank you!

## 2010-08-15 NOTE — Telephone Encounter (Signed)
Pt is requesting cheaper alt  For Cymbalta. She states the pharmacy has been contacting us about this. Please advise

## 2010-08-15 NOTE — Telephone Encounter (Signed)
Pt is also req Rf for Temazepam 15mg .  1 po qhs. # 90.  And Lorazepam 1mg  1 po bid # 180.     Ok to Rf? Send Rf to [facility]

## 2010-08-15 NOTE — Telephone Encounter (Signed)
Pt called - Pharm says Generic alt to cymbalta would be Venlafaxine ER, Is this an option?

## 2010-08-16 MED ORDER — LORAZEPAM 1 MG PO TABS: 1.0000 mg | ORAL_TABLET | Freq: Two times a day (BID) | ORAL | Status: DC | PRN

## 2010-08-16 MED ORDER — TEMAZEPAM 15 MG PO CAPS: 15.0000 mg | ORAL_CAPSULE | Freq: Every evening | ORAL | Status: DC | PRN
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
## 2010-08-19 NOTE — Op Note (Signed)
NAMEMARIYANNA, MUCHA            ACCOUNT NO.:  [id number]   MEDICAL RECORD NO.:  [id number]          PATIENT TYPE:  AMB   LOCATION:  DSC                          FACILITY:  [facility]   PHYSICIAN:  Cindee Salt, M.D.       DATE OF BIRTH:  1933/11/25   DATE OF PROCEDURE:  01/28/2004  DATE OF DISCHARGE:                                 OPERATIVE REPORT   PREOPERATIVE DIAGNOSIS:  Carpal tunnel syndrome and stenosing tenosynovitis  of right hand and right thumb.   POSTOPERATIVE DIAGNOSES:  1.  Carpal tunnel syndrome and stenosing tenosynovitis of right hand and      right thumb.  2.  Flexor sheath cyst, right thumb.   OPERATIONS:  1.  Release of right carpal tunnel.  2.  Release of A1 pulley of right thumb with excision of flexor sheath cyst.   SURGEON:  Cindee Salt, M.D.   ASSISTANT:  Carolyne Fiscal.   ANESTHESIA:  Forearm based IV regional.   HISTORY:  The patient is a 77 year old female with a history of carpal  tunnel syndrome, EMG and nerve conductions positive, which has not responded  to conservative treatment.  In addition, she has had triggering of her right  thumb, which has also not responded.   DESCRIPTION OF PROCEDURE:  The patient was brought to the operating room  where a forearm based IV regional anesthetic was carried out without  difficulty.  She was prepped using Duraprep in the supine position with the  right arm free.  A transverse incision was made over the A1 pulley and  carried down through the subcutaneous tissues.  Bleeders were  electrocauterized.  The dissection was carried down to the A1 pulley.  A  large cyst mass was present.  The radial and ulnar neurovascular bundles  were identified and protected.  The A1 pulley was released in its radial  aspect and the flexor sheath cyst excised along with a portion of pulley,  which was sent to pathology.  The thumb was placed through a full range of  motion.  No further triggering was identified.  The wound was irrigated  and  the skin was closed with interrupted 5-0 nylon sutures.  A separate incision  was then made longitudinally in the palm and carried down through  subcutaneous tissues.  Bleeders were electrocauterized again.  The palmar  fascia was split.  The superficial palmar arch was identified.  The flexor  tendon to the ring and little finger were identified to the ulnar side of  the median nerve.  The carpal retinaculum was incised with sharp dissection.  Right angle Sewell retractor placed between skin and forearm fascia.  The  fascia was released for 1.5 cm proximal to the wrist crease under direct  vision.  The canal was explored.  Tenosynovial tissue was thickened.  No  further lesions were identified.  The wound was irrigated.  The skin was  closed with interrupted 5-0 nylon sutures.  A sterile compressive  dressing and splint were applied.  The patient tolerated the procedure well  and was taken to the recovery room for observation in  satisfactory  condition.  She is discharged home to return to [facility] in  one week on [place].       GK/MEDQ  D:  01/28/2004  T:  01/28/2004  Job:  [id number]

## 2010-08-19 NOTE — Op Note (Signed)
   NAME:  Jessica [name], Jessica [name]                      ACCOUNT NO.:  [id number]   MEDICAL RECORD NO.:  [id number]                   PATIENT TYPE:  AMB   LOCATION:  DSC                                  FACILITY:  [facility]   PHYSICIAN:  Lowell Bouton, M.D.      DATE OF BIRTH:  [birth year]   DATE OF PROCEDURE:  12/17/2002  DATE OF DISCHARGE:                                 OPERATIVE REPORT   PREOPERATIVE DIAGNOSIS:  Left carpal tunnel syndrome.   POSTOPERATIVE DIAGNOSIS:  Left carpal tunnel syndrome.   OPERATION PERFORMED:  Decompression of median nerve, left carpal tunnel.   SURGEON:  Lowell Bouton, M.D.   ANESTHESIA:  0.5% Marcaine local with sedation.   OPERATIVE FINDINGS:  The patient had no masses in the carpal canal.  The  motor branch of the nerve was intact.   DESCRIPTION OF PROCEDURE:  Under 0.5% Marcaine local anesthesia with a  tourniquet on the left arm, the left hand was prepped and draped in the  usual fashion and after exsanguinating the limb, the tourniquet was inflated  to 250 mmHg.  A 3 cm longitudinal incision was made in the palm just ulnar  to the thenar crease.  Sharp dissection was carried through the subcutaneous  tissues.  Blunt dissection was carried down through the superficial palmar  fascia distal to the transverse carpal ligament.  A hemostat was placed in  the carpal canal up against the hook of the hamate and the transverse carpal  ligament was divided on the ulnar border of the median nerve.  The proximal  end of the ligament was divided with the scissors after dissecting the nerve  away from the undersurface of the ligament.  The carpal canal was then  palpated and was found to be adequately decompressed.  The nerve was  examined and the motor branch identified.  The wound was then irrigated with  saline.  The skin was closed with 4-0 nylon suture.  Sterile dressings were  applied followed by a volar wrist splint.  The patient tolerated  the  procedure well and went to the recovery room awake and stable in  good  condition.                                                 Lowell Bouton, M.D.    EMM/MEDQ  D:  12/17/2002  T:  12/17/2002  Job:  [id number]   cc:   Gretta Arab. Valentina Lucks, M.D.  [address] [place]  [place] [address]  Fax: [phone]

## 2010-08-26 ENCOUNTER — Telehealth: Payer: Self-pay | Admitting: Internal Medicine

## 2010-08-26 ENCOUNTER — Other Ambulatory Visit: Payer: Self-pay | Admitting: *Deleted

## 2010-08-26 ENCOUNTER — Other Ambulatory Visit (INDEPENDENT_AMBULATORY_CARE_PROVIDER_SITE_OTHER): Payer: 59 | Admitting: Internal Medicine

## 2010-08-26 ENCOUNTER — Encounter: Payer: Self-pay | Admitting: Internal Medicine

## 2010-08-26 ENCOUNTER — Ambulatory Visit (INDEPENDENT_AMBULATORY_CARE_PROVIDER_SITE_OTHER): Payer: 59 | Admitting: Internal Medicine

## 2010-08-26 ENCOUNTER — Other Ambulatory Visit (INDEPENDENT_AMBULATORY_CARE_PROVIDER_SITE_OTHER): Payer: Medicare PPO

## 2010-08-26 DIAGNOSIS — M545 Low back pain, unspecified: Secondary | ICD-10-CM

## 2010-08-26 DIAGNOSIS — I10 Essential (primary) hypertension: Secondary | ICD-10-CM

## 2010-08-26 DIAGNOSIS — R739 Hyperglycemia, unspecified: Secondary | ICD-10-CM

## 2010-08-26 DIAGNOSIS — Z0000 Encounter for general adult medical examination without abnormal findings: Secondary | ICD-10-CM

## 2010-08-26 DIAGNOSIS — R209 Unspecified disturbances of skin sensation: Secondary | ICD-10-CM

## 2010-08-26 DIAGNOSIS — R202 Paresthesia of skin: Secondary | ICD-10-CM

## 2010-08-26 DIAGNOSIS — Z13220 Encounter for screening for lipoid disorders: Secondary | ICD-10-CM

## 2010-08-26 DIAGNOSIS — M6281 Muscle weakness (generalized): Secondary | ICD-10-CM

## 2010-08-26 DIAGNOSIS — Z79899 Other long term (current) drug therapy: Secondary | ICD-10-CM

## 2010-08-26 DIAGNOSIS — M8580 Other specified disorders of bone density and structure, unspecified site: Secondary | ICD-10-CM

## 2010-08-26 DIAGNOSIS — R7309 Other abnormal glucose: Secondary | ICD-10-CM

## 2010-08-26 DIAGNOSIS — E119 Type 2 diabetes mellitus without complications: Secondary | ICD-10-CM

## 2010-08-26 DIAGNOSIS — M899 Disorder of bone, unspecified: Secondary | ICD-10-CM

## 2010-08-26 DIAGNOSIS — R29898 Other symptoms and signs involving the musculoskeletal system: Secondary | ICD-10-CM

## 2010-08-26 LAB — LIPID PANEL
Cholesterol: 203 mg/dL — ABNORMAL HIGH (ref 0–200)
Total CHOL/HDL Ratio: 5
Triglycerides: 175 mg/dL — ABNORMAL HIGH (ref 0.0–149.0)
VLDL: 35 mg/dL (ref 0.0–40.0)

## 2010-08-26 LAB — COMPREHENSIVE METABOLIC PANEL
AST: 30 U/L (ref 0–37)
Alkaline Phosphatase: 74 U/L (ref 39–117)
BUN: 18 mg/dL (ref 6–23)
Creatinine, Ser: 0.7 mg/dL (ref 0.4–1.2)

## 2010-08-26 LAB — URINALYSIS, ROUTINE W REFLEX MICROSCOPIC
Ketones, ur: NEGATIVE
Nitrite: NEGATIVE
Specific Gravity, Urine: 1.015 (ref 1.000–1.030)
Urobilinogen, UA: 0.2 (ref 0.0–1.0)
pH: 6.5 (ref 5.0–8.0)

## 2010-08-26 LAB — CBC WITH DIFFERENTIAL/PLATELET
Basophils Relative: 0.5 % (ref 0.0–3.0)
Eosinophils Absolute: 0.2 10*3/uL (ref 0.0–0.7)
HCT: 40.1 % (ref 36.0–46.0)
Hemoglobin: 13.7 g/dL (ref 12.0–15.0)
Lymphs Abs: 2.7 10*3/uL (ref 0.7–4.0)
MCHC: 34.1 g/dL (ref 30.0–36.0)
MCV: 90.1 fl (ref 78.0–100.0)
Monocytes Absolute: 0.6 10*3/uL (ref 0.1–1.0)
Neutro Abs: 3.5 10*3/uL (ref 1.4–7.7)
RBC: 4.45 Mil/uL (ref 3.87–5.11)

## 2010-08-26 LAB — VITAMIN B12: Vitamin B-12: 498 pg/mL (ref 211–911)

## 2010-08-26 MED ORDER — LOSARTAN POTASSIUM 100 MG PO TABS: 100.0000 mg | ORAL_TABLET | Freq: Every day | ORAL | Status: DC

## 2010-08-26 MED ORDER — ALLOPURINOL 100 MG PO TABS: 100.0000 mg | ORAL_TABLET | Freq: Every day | ORAL | Status: DC

## 2010-08-26 MED ORDER — DICLOFENAC SODIUM 1.5 % TD SOLN: 3.0000 [drp] | Freq: Three times a day (TID) | TRANSDERMAL | Status: DC | PRN

## 2010-08-26 MED ORDER — FUROSEMIDE 20 MG PO TABS: 20.0000 mg | ORAL_TABLET | Freq: Every day | ORAL | Status: DC | PRN

## 2010-08-26 NOTE — Assessment & Plan Note (Signed)
On Rx 

## 2010-08-26 NOTE — Assessment & Plan Note (Signed)
No change 

## 2010-08-26 NOTE — Assessment & Plan Note (Signed)
Doing fair 

## 2010-08-26 NOTE — Progress Notes (Signed)
Subjective:    Patient ID: Jessica [name], female    DOB: Aug 21, 1933, 77 y.o.   MRN: [id number]  HPI  The patient is here for a wellness exam. The patient has been doing well overall without major physical or psychological issues going on lately. The patient needs to address  chronic hypertension that has been well controlled with medicines; to address chronic  hyperlipidemia controlled with medicines as well; and to address type 2 chronic diabetes, controlled with medical treatment and diet.   Review of Systems  Constitutional: Positive for unexpected weight change (gain). Negative for fever, chills, diaphoresis, activity change, appetite change and fatigue.  HENT: Negative for hearing loss, ear pain, nosebleeds, congestion, sore throat, facial swelling, rhinorrhea, sneezing, mouth sores, trouble swallowing, neck pain, neck stiffness, postnasal drip, sinus pressure and tinnitus.   Eyes: Negative for pain, discharge, redness, itching and visual disturbance.  Respiratory: Negative for cough, chest tightness, shortness of breath, wheezing and stridor.   Cardiovascular: Negative for chest pain, palpitations and leg swelling.  Gastrointestinal: Negative for nausea, diarrhea, constipation, blood in stool, abdominal distention, anal bleeding and rectal pain.  Genitourinary: Negative for dysuria, urgency, frequency, hematuria, flank pain, vaginal bleeding, vaginal discharge, difficulty urinating, genital sores and pelvic pain.  Musculoskeletal: Positive for back pain, arthralgias and gait problem. Negative for joint swelling.  Skin: Negative.  Negative for rash.  Neurological: Negative for dizziness, tremors, seizures, syncope, speech difficulty, weakness, numbness and headaches.  Hematological: Negative for adenopathy. Does not bruise/bleed easily.  Psychiatric/Behavioral: Negative for suicidal ideas, behavioral problems, sleep disturbance, dysphoric mood, decreased concentration and agitation.  The patient is nervous/anxious.        Depressed       Wt Readings from Last 3 Encounters:  08/26/10 258 lb (117.028 kg)  07/15/10 257 lb (116.574 kg)  04/12/10 253 lb (114.76 kg)    Objective:   Physical Exam  Constitutional: She appears well-developed and well-nourished. No distress.       Obese  HENT:  Head: Normocephalic.  Right Ear: External ear normal.  Left Ear: External ear normal.  Nose: Nose normal.  Mouth/Throat: Oropharynx is clear and moist.  Eyes: Conjunctivae are normal. Pupils are equal, round, and reactive to light. Right eye exhibits no discharge. Left eye exhibits no discharge.  Neck: Normal range of motion. Neck supple. No JVD present. No tracheal deviation present. No thyromegaly present.  Cardiovascular: Normal rate, regular rhythm and normal heart sounds.   Pulmonary/Chest: No stridor. No respiratory distress. She has no wheezes.  Abdominal: Soft. Bowel sounds are normal. She exhibits no distension and no mass. There is no tenderness. There is no rebound and no guarding.  Musculoskeletal: She exhibits no edema and no tenderness.  Lymphadenopathy:    She has no cervical adenopathy.  Neurological: She displays normal reflexes. No cranial nerve deficit. She exhibits normal muscle tone. Coordination normal.  Skin: No rash noted. No erythema.  Psychiatric: Her behavior is normal. Judgment and thought content normal.       Depressed some        Lab Results  Component Value Date   WBC 6.6 01/26/2009   HGB 13.9 01/26/2009   HCT 40.6 01/26/2009   PLT 341.0 01/26/2009   CHOL 223* 12/02/2009   TRIG 223.0* 12/02/2009   HDL 43.30 12/02/2009   LDLDIRECT 159.2 12/02/2009   ALT 21 07/11/2010   AST 20 07/11/2010   NA 136 07/11/2010   K 4.1 07/11/2010   CL 100 07/11/2010   CREATININE  0.6 07/11/2010   BUN 17 07/11/2010   CO2 26 07/11/2010   TSH 4.07 09/01/2009   INR 2.7 ratio* 10/13/2008   HGBA1C 6.8* 07/11/2010   MICROALBUR 4.5* 12/02/2009     Assessment & Plan:  Weakness of left  leg Doing fair  LOW BACK PAIN No change  HYPERTENSION On Rx    Wellness  The patient is here for annual Medicare wellness examination and management of other chronic and acute problems.   The risk factors are reflected in the social history.  The roster of all physicians providing medical care to patient - is listed in the Snapshot section of the chart.  Activities of daily living:  The patient is 100% inedpendent in all ADLs: dressing, toileting, feeding as well as independent mobility  Home safety : The patient has smoke detectors in the home. They wear seatbelts.No firearms at home ( firearms are present in the home, kept in a safe fashion). There is no violence in the home.   There is no risks for hepatitis, STDs or HIV. There is no   history of blood transfusion. They have no travel history to infectious disease endemic areas of the world.  The patient has (has not) seen their dentist in the last six month. They have (not) seen their eye doctor in the last year. They deny (admit to) any hearing difficulty and have not had audiologic testing in the last year.  They do not  have excessive sun exposure. Discussed the need for sun protection: hats, long sleeves and use of sunscreen if there is significant sun exposure.   Diet: the importance of a healthy diet is discussed. They do have a healthy (unhealthy-high fat/fast food) diet.  The patient has a regular exercise program: __walking her dog__30 min__ , ____duration, ___7_per week.  The benefits of regular aerobic exercise were discussed.  Depression screen: there are no signs or vegative symptoms of depression- irritability, change in appetite, anhedonia, sadness/tearfullness.  Cognitive assessment: the patient manages all their financial and personal affairs and is actively engaged. They could relate day,date,year and events; recalled 3/3 objects at 3 minutes; performed clock-face test normally.  The following portions of the  patient's history were reviewed and updated as appropriate: allergies, current medications, past family history, past medical history,  past surgical history, past social history  and problem list.  Vision, hearing, body mass index were assessed and reviewed.   During the course of the visit the patient was educated and counseled about appropriate screening and preventive services including : fall prevention , diabetes screening, nutrition counseling, colorectal cancer screening, and recommended immunizations.

## 2010-08-26 NOTE — Telephone Encounter (Signed)
Stacey, please, inform patient that all labs are OK Thx   

## 2010-08-27 LAB — VITAMIN D 25 HYDROXY (VIT D DEFICIENCY, FRACTURES): Vit D, 25-Hydroxy: 70 ng/mL (ref 30–89)

## 2010-08-30 ENCOUNTER — Telehealth: Payer: Self-pay | Admitting: *Deleted

## 2010-08-30 MED ORDER — VENLAFAXINE HCL ER 75 MG PO CP24: 150.0000 mg | ORAL_CAPSULE | Freq: Every day | ORAL | Status: DC

## 2010-08-30 NOTE — Telephone Encounter (Signed)
ok 

## 2010-08-30 NOTE — Telephone Encounter (Signed)
rec fax from pharm stating Cymbalta is not on pt's formulary. Ins recommends Venlafaxine ER. Please send new rx if appropriate.

## 2010-08-31 NOTE — Telephone Encounter (Signed)
Pt informed

## 2010-09-01 ENCOUNTER — Telehealth: Payer: Self-pay | Admitting: *Deleted

## 2010-09-01 NOTE — Telephone Encounter (Signed)
Done 1 po qd Thx

## 2010-09-01 NOTE — Telephone Encounter (Signed)
Need clarification on Venlafaxine ER 75mg .[name]..form is on ledge at nurse station to be signed/faxed by Dr. Posey Rea.

## 2010-09-01 NOTE — Telephone Encounter (Signed)
Patient requesting RX for diabetic shoes and inserts. OK?  (Pt will pick up or ok to mail)

## 2010-09-02 NOTE — Telephone Encounter (Signed)
Left detaeiled mess informing pt to pick up signed order for diabetic shoes/inserts.

## 2010-09-02 NOTE — Telephone Encounter (Signed)
Will do order for

## 2010-09-07 ENCOUNTER — Other Ambulatory Visit: Payer: Self-pay | Admitting: Internal Medicine

## 2010-09-21 ENCOUNTER — Encounter: Payer: Self-pay | Admitting: Internal Medicine

## 2010-09-21 ENCOUNTER — Ambulatory Visit: Payer: 59 | Admitting: Internal Medicine

## 2010-11-07 ENCOUNTER — Telehealth: Payer: Self-pay | Admitting: *Deleted

## 2010-11-07 NOTE — Telephone Encounter (Signed)
OK to fill this prescription with additional refills x2 Thank you!  

## 2010-11-07 NOTE — Telephone Encounter (Signed)
Rf req for Hydroco/APAP 5.-325 1 up to QID prn pain. 3 120. Last filled 09-19-10. Ok to Rf?

## 2010-11-08 MED ORDER — HYDROCODONE-ACETAMINOPHEN 5-325 MG PO TABS: 1.0000 | ORAL_TABLET | Freq: Four times a day (QID) | ORAL | Status: DC | PRN

## 2010-12-06 ENCOUNTER — Other Ambulatory Visit: Payer: Self-pay | Admitting: *Deleted

## 2010-12-06 ENCOUNTER — Telehealth: Payer: Self-pay | Admitting: *Deleted

## 2010-12-06 MED ORDER — OMEPRAZOLE 40 MG PO CPDR: 40.0000 mg | DELAYED_RELEASE_CAPSULE | ORAL | Status: DC

## 2010-12-06 NOTE — Telephone Encounter (Signed)
Rec rf req for Allopurinol. On the med list it states this med is "on hold". Is it ok to Rf?

## 2010-12-07 NOTE — Telephone Encounter (Signed)
OK to fill this prescription with additional refills x11 Thank you!  

## 2010-12-08 MED ORDER — ALLOPURINOL 100 MG PO TABS: 100.0000 mg | ORAL_TABLET | Freq: Every day | ORAL | Status: DC

## 2010-12-22 ENCOUNTER — Telehealth: Payer: Self-pay | Admitting: *Deleted

## 2010-12-22 NOTE — Telephone Encounter (Signed)
Pt calling req Rf for lorazepam and Temazepam be called into [facility] or faxed to [phone]. Please advise ok to Rf?

## 2010-12-22 NOTE — Telephone Encounter (Signed)
OK to fill both prescriptions with additional refills x2 Thank you!  

## 2010-12-23 ENCOUNTER — Other Ambulatory Visit: Payer: 59

## 2010-12-23 ENCOUNTER — Other Ambulatory Visit: Payer: Self-pay | Admitting: *Deleted

## 2010-12-23 DIAGNOSIS — E119 Type 2 diabetes mellitus without complications: Secondary | ICD-10-CM

## 2010-12-23 DIAGNOSIS — I10 Essential (primary) hypertension: Secondary | ICD-10-CM

## 2010-12-23 DIAGNOSIS — E039 Hypothyroidism, unspecified: Secondary | ICD-10-CM

## 2010-12-23 MED ORDER — TEMAZEPAM 15 MG PO CAPS: 15.0000 mg | ORAL_CAPSULE | Freq: Every evening | ORAL | Status: DC | PRN

## 2010-12-23 MED ORDER — LORAZEPAM 1 MG PO TABS: 1.0000 mg | ORAL_TABLET | Freq: Two times a day (BID) | ORAL | Status: DC | PRN

## 2010-12-23 NOTE — Telephone Encounter (Signed)
Rx's faxed.

## 2010-12-23 NOTE — Telephone Encounter (Signed)
Rxs printed/pending sig

## 2010-12-26 ENCOUNTER — Other Ambulatory Visit (INDEPENDENT_AMBULATORY_CARE_PROVIDER_SITE_OTHER): Payer: 59

## 2010-12-26 DIAGNOSIS — E119 Type 2 diabetes mellitus without complications: Secondary | ICD-10-CM

## 2010-12-26 DIAGNOSIS — I10 Essential (primary) hypertension: Secondary | ICD-10-CM

## 2010-12-26 DIAGNOSIS — E039 Hypothyroidism, unspecified: Secondary | ICD-10-CM

## 2010-12-26 LAB — HEMOGLOBIN A1C: Hgb A1c MFr Bld: 6.5 % (ref 4.6–6.5)

## 2010-12-27 LAB — COMPREHENSIVE METABOLIC PANEL
Albumin: 3.9 g/dL (ref 3.5–5.2)
CO2: 22 mEq/L (ref 19–32)
Calcium: 9.9 mg/dL (ref 8.4–10.5)
GFR: 72.18 mL/min (ref >60.00)
Glucose, Bld: 103 mg/dL — ABNORMAL HIGH (ref 70–99)
Potassium: 4.2 mEq/L (ref 3.5–5.1)
Sodium: 141 mEq/L (ref 135–145)
Total Protein: 8 g/dL (ref 6.0–8.3)

## 2010-12-29 ENCOUNTER — Ambulatory Visit (INDEPENDENT_AMBULATORY_CARE_PROVIDER_SITE_OTHER): Payer: 59 | Admitting: Internal Medicine

## 2010-12-29 ENCOUNTER — Encounter: Payer: Self-pay | Admitting: Internal Medicine

## 2010-12-29 VITALS — BP 130/80 | HR 72 | Temp 98.1°F | Resp 16 | Wt 258.0 lb

## 2010-12-29 DIAGNOSIS — Z23 Encounter for immunization: Secondary | ICD-10-CM

## 2010-12-29 DIAGNOSIS — F4321 Adjustment disorder with depressed mood: Secondary | ICD-10-CM

## 2010-12-29 DIAGNOSIS — M545 Low back pain: Secondary | ICD-10-CM

## 2010-12-29 DIAGNOSIS — K219 Gastro-esophageal reflux disease without esophagitis: Secondary | ICD-10-CM

## 2010-12-29 DIAGNOSIS — E119 Type 2 diabetes mellitus without complications: Secondary | ICD-10-CM

## 2010-12-29 DIAGNOSIS — I10 Essential (primary) hypertension: Secondary | ICD-10-CM

## 2010-12-29 MED ORDER — NABUMETONE 500 MG PO TABS: 500.0000 mg | ORAL_TABLET | Freq: Two times a day (BID) | ORAL | Status: DC

## 2010-12-29 MED ORDER — TRAMADOL HCL 50 MG PO TABS: 50.0000 mg | ORAL_TABLET | Freq: Two times a day (BID) | ORAL | Status: DC | PRN

## 2010-12-29 NOTE — Assessment & Plan Note (Signed)
Continue with current prescription therapy as reflected on the Med list.  

## 2010-12-29 NOTE — Progress Notes (Signed)
  Subjective:    Patient ID: Jessica [name], female    DOB: 1933/08/31, 77 y.o.   MRN: [id number]  HPI  The patient presents for a follow-up of  chronic depression, hypertension, chronic dyslipidemia, type 2 diabetes controlled with medicines    Review of Systems  Constitutional: Negative for chills, activity change, appetite change, fatigue and unexpected weight change.  HENT: Negative for congestion, mouth sores and sinus pressure.   Eyes: Negative for visual disturbance.  Respiratory: Negative for cough and chest tightness.   Gastrointestinal: Negative for nausea and abdominal pain.  Genitourinary: Negative for frequency, difficulty urinating and vaginal pain.  Musculoskeletal: Negative for back pain and gait problem.  Skin: Negative for pallor and rash.  Neurological: Negative for dizziness, tremors, weakness, numbness and headaches.  Psychiatric/Behavioral: Negative for suicidal ideas, confusion and sleep disturbance. The patient is nervous/anxious.        Objective:   Physical Exam  Constitutional: She appears well-developed and well-nourished. No distress.  HENT:  Head: Normocephalic.  Right Ear: External ear normal.  Left Ear: External ear normal.  Nose: Nose normal.  Mouth/Throat: Oropharynx is clear and moist.  Eyes: Conjunctivae are normal. Pupils are equal, round, and reactive to light. Right eye exhibits no discharge. Left eye exhibits no discharge.  Neck: Normal range of motion. Neck supple. No JVD present. No tracheal deviation present. No thyromegaly present.  Cardiovascular: Normal rate, regular rhythm and normal heart sounds.   Pulmonary/Chest: No stridor. No respiratory distress. She has no wheezes.  Abdominal: Soft. Bowel sounds are normal. She exhibits no distension and no mass. There is no tenderness. There is no rebound and no guarding.  Musculoskeletal: She exhibits no edema and no tenderness.  Lymphadenopathy:    She has no cervical adenopathy.    Neurological: She displays normal reflexes. No cranial nerve deficit. She exhibits normal muscle tone. Coordination normal.  Skin: No rash noted. No erythema.  Psychiatric: Her behavior is normal. Judgment and thought content normal.       subdude    Lab Results  Component Value Date   WBC 7.0 08/26/2010   HGB 13.7 08/26/2010   HCT 40.1 08/26/2010   PLT 363.0 08/26/2010   CHOL 203* 08/26/2010   TRIG 175.0* 08/26/2010   HDL 44.00 08/26/2010   LDLDIRECT 159.1 08/26/2010   ALT 28 12/26/2010   AST 31 12/26/2010   NA 141 12/26/2010   K 4.2 12/26/2010   CL 107 12/26/2010   CREATININE 0.8 12/26/2010   BUN 16 12/26/2010   CO2 22 12/26/2010   TSH 2.97 12/26/2010   INR 2.7 ratio* 10/13/2008   HGBA1C 6.5 12/26/2010   MICROALBUR 4.5* 12/02/2009         Assessment & Plan:

## 2010-12-29 NOTE — Assessment & Plan Note (Signed)
Better Wt Readings from Last 3 Encounters:  12/29/10 258 lb (117.028 kg)  08/26/10 258 lb (117.028 kg)  07/15/10 257 lb (116.574 kg)

## 2010-12-29 NOTE — Assessment & Plan Note (Signed)
Tramadol prn Relafen

## 2011-01-03 ENCOUNTER — Telehealth: Payer: Self-pay | Admitting: *Deleted

## 2011-01-03 NOTE — Telephone Encounter (Signed)
Caller is inquiring about back brace order they faxed over >2 wks ago. Dr. Posey Rea- he does not want pt to have this. I spoke to pt and she states she doesn't want/need this. I faxed back a generic note to [facility] stating DENIED.

## 2011-01-04 ENCOUNTER — Telehealth: Payer: Self-pay | Admitting: *Deleted

## 2011-01-04 ENCOUNTER — Other Ambulatory Visit: Payer: Self-pay | Admitting: Internal Medicine

## 2011-01-04 MED ORDER — LOSARTAN POTASSIUM 100 MG PO TABS: 100.0000 mg | ORAL_TABLET | Freq: Every day | ORAL | Status: DC

## 2011-01-04 MED ORDER — FUROSEMIDE 20 MG PO TABS: 20.0000 mg | ORAL_TABLET | Freq: Every day | ORAL | Status: DC | PRN

## 2011-01-04 NOTE — Telephone Encounter (Signed)
Pt calling requesting fluid pill and blood pressure med be sent to [facility]. Rfs sent.

## 2011-01-13 LAB — URINALYSIS, ROUTINE W REFLEX MICROSCOPIC
Bilirubin Urine: NEGATIVE
Glucose, UA: NEGATIVE
Hgb urine dipstick: NEGATIVE
Ketones, ur: NEGATIVE
Nitrite: NEGATIVE
Specific Gravity, Urine: 1.007
pH: 6.5

## 2011-01-13 LAB — URINE MICROSCOPIC-ADD ON

## 2011-01-24 ENCOUNTER — Telehealth: Payer: Self-pay | Admitting: *Deleted

## 2011-01-24 ENCOUNTER — Telehealth: Payer: Self-pay | Admitting: Internal Medicine

## 2011-01-24 MED ORDER — NABUMETONE 500 MG PO TABS: 500.0000 mg | ORAL_TABLET | Freq: Two times a day (BID) | ORAL | Status: DC

## 2011-01-24 NOTE — Telephone Encounter (Signed)
Pt left vm requesting that Nabumetone be sent to [facility] mail order due to cost savings. Pt informed rx sent to mail order.

## 2011-01-24 NOTE — Telephone Encounter (Signed)
Received 19 pages from [facility]. Forwarded to Dr. Posey Rea for review. 01/24/11-ar

## 2011-03-10 ENCOUNTER — Telehealth: Payer: Self-pay | Admitting: *Deleted

## 2011-03-10 NOTE — Telephone Encounter (Signed)
Rf phoned in on 03-10-11. Just need to document.

## 2011-03-10 NOTE — Telephone Encounter (Signed)
Rf req for Hydroco/APAP5-325 1 po qid # 120. Last filled 10.25.12. Ok to RF?

## 2011-03-10 NOTE — Telephone Encounter (Signed)
OK to fill this prescription with additional refills x1 Thank you!  

## 2011-03-13 MED ORDER — HYDROCODONE-ACETAMINOPHEN 5-325 MG PO TABS: 1.0000 | ORAL_TABLET | Freq: Four times a day (QID) | ORAL | Status: DC

## 2011-03-31 ENCOUNTER — Ambulatory Visit: Payer: 59 | Admitting: Internal Medicine

## 2011-03-31 ENCOUNTER — Other Ambulatory Visit (INDEPENDENT_AMBULATORY_CARE_PROVIDER_SITE_OTHER): Payer: 59

## 2011-03-31 DIAGNOSIS — E119 Type 2 diabetes mellitus without complications: Secondary | ICD-10-CM

## 2011-03-31 DIAGNOSIS — F4321 Adjustment disorder with depressed mood: Secondary | ICD-10-CM

## 2011-03-31 DIAGNOSIS — M545 Low back pain: Secondary | ICD-10-CM

## 2011-03-31 LAB — COMPREHENSIVE METABOLIC PANEL
ALT: 35 U/L (ref 0–35)
AST: 38 U/L — ABNORMAL HIGH (ref 0–37)
Alkaline Phosphatase: 87 U/L (ref 39–117)
Glucose, Bld: 118 mg/dL — ABNORMAL HIGH (ref 70–99)
Sodium: 138 mEq/L (ref 135–145)
Total Bilirubin: 0.4 mg/dL (ref 0.3–1.2)
Total Protein: 8 g/dL (ref 6.0–8.3)

## 2011-03-31 LAB — TSH: TSH: 3.62 u[IU]/mL (ref 0.35–5.50)

## 2011-03-31 LAB — HEMOGLOBIN A1C: Hgb A1c MFr Bld: 6.7 % — ABNORMAL HIGH (ref 4.6–6.5)

## 2011-04-06 ENCOUNTER — Encounter: Payer: Self-pay | Admitting: Internal Medicine

## 2011-04-06 ENCOUNTER — Ambulatory Visit (INDEPENDENT_AMBULATORY_CARE_PROVIDER_SITE_OTHER): Payer: 59 | Admitting: Internal Medicine

## 2011-04-06 VITALS — BP 160/90 | HR 76 | Temp 97.5°F | Resp 16 | Wt 266.0 lb

## 2011-04-06 DIAGNOSIS — E119 Type 2 diabetes mellitus without complications: Secondary | ICD-10-CM

## 2011-04-06 DIAGNOSIS — F329 Major depressive disorder, single episode, unspecified: Secondary | ICD-10-CM

## 2011-04-06 DIAGNOSIS — E039 Hypothyroidism, unspecified: Secondary | ICD-10-CM

## 2011-04-06 DIAGNOSIS — M545 Low back pain: Secondary | ICD-10-CM

## 2011-04-06 DIAGNOSIS — I10 Essential (primary) hypertension: Secondary | ICD-10-CM

## 2011-04-06 NOTE — Assessment & Plan Note (Signed)
Continue with current prescription therapy as reflected on the Med list.  

## 2011-04-06 NOTE — Assessment & Plan Note (Signed)
Chronic Not well controlled Risks associated with diet and treatment noncompliance were discussed. Compliance was encouraged. Continue with current prescription therapy as reflected on the Med list.

## 2011-04-06 NOTE — Progress Notes (Signed)
  Subjective:    Patient ID: Jessica [name], female    DOB: [birth year], 78 y.o.   MRN: [id number]  HPI The patient presents for a follow-up of  chronic hypertension, chronic dyslipidemia, type 2 diabetes controlled with medicines. C/o LBP. C/o L foot pain - chronic     Review of Systems  Constitutional: Negative for chills, activity change, appetite change, fatigue and unexpected weight change.  HENT: Negative for congestion, mouth sores and sinus pressure.   Eyes: Negative for visual disturbance.  Respiratory: Negative for cough and chest tightness.   Cardiovascular: Negative for leg swelling.  Gastrointestinal: Negative for nausea and abdominal pain.  Genitourinary: Negative for frequency, difficulty urinating and vaginal pain.  Musculoskeletal: Positive for back pain, arthralgias and gait problem.  Skin: Negative for pallor and rash.  Neurological: Negative for dizziness, tremors, weakness, numbness and headaches.  Psychiatric/Behavioral: Negative for suicidal ideas, confusion and sleep disturbance. The patient is nervous/anxious.        Objective:   Physical Exam  Constitutional: She appears well-developed.  HENT:  Head: Normocephalic.  Right Ear: External ear normal.  Left Ear: External ear normal.  Nose: Nose normal.  Mouth/Throat: Oropharynx is clear and moist.  Eyes: Conjunctivae are normal. Pupils are equal, round, and reactive to light. Right eye exhibits no discharge. Left eye exhibits no discharge.  Neck: Normal range of motion. Neck supple. No JVD present. No tracheal deviation present. No thyromegaly present.  Cardiovascular: Normal rate, regular rhythm and normal heart sounds.   Pulmonary/Chest: No stridor. No respiratory distress. She has no wheezes.  Abdominal: Soft. Bowel sounds are normal. She exhibits no distension and no mass. There is no tenderness. There is no rebound and no guarding.  Musculoskeletal: She exhibits tenderness (LS spine, L dors foot).  She exhibits no edema.  Lymphadenopathy:    She has no cervical adenopathy.  Neurological: She displays normal reflexes. No cranial nerve deficit. She exhibits normal muscle tone. Coordination normal.  Skin: No rash noted. No erythema.  Psychiatric: She has a normal mood and affect. Her behavior is normal. Judgment and thought content normal.    Lab Results  Component Value Date   WBC 7.0 08/26/2010   HGB 13.7 08/26/2010   HCT 40.1 08/26/2010   PLT 363.0 08/26/2010   GLUCOSE 118* 03/31/2011   CHOL 203* 08/26/2010   TRIG 175.0* 08/26/2010   HDL 44.00 08/26/2010   LDLDIRECT 159.1 08/26/2010   ALT 35 03/31/2011   AST 38* 03/31/2011   NA 138 03/31/2011   K 4.2 03/31/2011   CL 103 03/31/2011   CREATININE 0.7 03/31/2011   BUN 16 03/31/2011   CO2 27 03/31/2011   TSH 3.62 03/31/2011   INR 2.7 ratio* 10/13/2008   HGBA1C 6.7* 03/31/2011   MICROALBUR 4.5* 12/02/2009         Assessment & Plan:

## 2011-05-26 ENCOUNTER — Telehealth: Payer: Self-pay | Admitting: *Deleted

## 2011-05-26 NOTE — Telephone Encounter (Signed)
Rf req for Hydroco/APAP 5-325 1 po qid # 120. Ok to Rf?

## 2011-05-26 NOTE — Telephone Encounter (Signed)
OK to fill this prescription with additional refills x1 Thank you!  

## 2011-05-29 MED ORDER — HYDROCODONE-ACETAMINOPHEN 5-325 MG PO TABS: 1.0000 | ORAL_TABLET | Freq: Four times a day (QID) | ORAL | Status: DC

## 2011-05-31 ENCOUNTER — Other Ambulatory Visit: Payer: Self-pay | Admitting: *Deleted

## 2011-05-31 MED ORDER — TEMAZEPAM 15 MG PO CAPS: 15.0000 mg | ORAL_CAPSULE | Freq: Every evening | ORAL | Status: DC | PRN

## 2011-05-31 MED ORDER — LORAZEPAM 1 MG PO TABS: 1.0000 mg | ORAL_TABLET | Freq: Two times a day (BID) | ORAL | Status: DC | PRN

## 2011-05-31 MED ORDER — VENLAFAXINE HCL ER 75 MG PO CP24: 75.0000 mg | ORAL_CAPSULE | Freq: Every day | ORAL | Status: DC

## 2011-07-04 ENCOUNTER — Other Ambulatory Visit (INDEPENDENT_AMBULATORY_CARE_PROVIDER_SITE_OTHER): Payer: 59

## 2011-07-04 DIAGNOSIS — E119 Type 2 diabetes mellitus without complications: Secondary | ICD-10-CM

## 2011-07-04 DIAGNOSIS — F329 Major depressive disorder, single episode, unspecified: Secondary | ICD-10-CM

## 2011-07-04 DIAGNOSIS — M545 Low back pain, unspecified: Secondary | ICD-10-CM

## 2011-07-04 DIAGNOSIS — E039 Hypothyroidism, unspecified: Secondary | ICD-10-CM

## 2011-07-04 DIAGNOSIS — F3289 Other specified depressive episodes: Secondary | ICD-10-CM

## 2011-07-04 DIAGNOSIS — I10 Essential (primary) hypertension: Secondary | ICD-10-CM

## 2011-07-04 LAB — BASIC METABOLIC PANEL WITH GFR
BUN: 16 mg/dL (ref 6–23)
CO2: 27 meq/L (ref 19–32)
Calcium: 9.7 mg/dL (ref 8.4–10.5)
Chloride: 102 meq/L (ref 96–112)
Creatinine, Ser: 0.8 mg/dL (ref 0.4–1.2)
GFR: 77.51 mL/min (ref >60.00)
Glucose, Bld: 127 mg/dL — ABNORMAL HIGH (ref 70–99)
Potassium: 3.7 meq/L (ref 3.5–5.1)
Sodium: 139 meq/L (ref 135–145)

## 2011-07-10 ENCOUNTER — Encounter: Payer: Self-pay | Admitting: Internal Medicine

## 2011-07-10 ENCOUNTER — Ambulatory Visit (INDEPENDENT_AMBULATORY_CARE_PROVIDER_SITE_OTHER): Payer: 59 | Admitting: Internal Medicine

## 2011-07-10 VITALS — BP 120/90 | HR 80 | Temp 98.1°F | Resp 16 | Wt 263.0 lb

## 2011-07-10 DIAGNOSIS — E785 Hyperlipidemia, unspecified: Secondary | ICD-10-CM

## 2011-07-10 DIAGNOSIS — F329 Major depressive disorder, single episode, unspecified: Secondary | ICD-10-CM

## 2011-07-10 DIAGNOSIS — F411 Generalized anxiety disorder: Secondary | ICD-10-CM

## 2011-07-10 DIAGNOSIS — M545 Low back pain: Secondary | ICD-10-CM

## 2011-07-10 DIAGNOSIS — N259 Disorder resulting from impaired renal tubular function, unspecified: Secondary | ICD-10-CM

## 2011-07-10 DIAGNOSIS — E119 Type 2 diabetes mellitus without complications: Secondary | ICD-10-CM

## 2011-07-10 DIAGNOSIS — E039 Hypothyroidism, unspecified: Secondary | ICD-10-CM

## 2011-07-10 NOTE — Assessment & Plan Note (Signed)
Continue with current prescription therapy as reflected on the Med list.  

## 2011-07-10 NOTE — Assessment & Plan Note (Signed)
A little better 
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
## 2011-08-07 ENCOUNTER — Other Ambulatory Visit: Payer: Self-pay | Admitting: *Deleted

## 2011-08-07 ENCOUNTER — Telehealth: Payer: Self-pay | Admitting: *Deleted

## 2011-08-07 MED ORDER — LEVOTHYROXINE SODIUM 75 MCG PO TABS: 75.0000 ug | ORAL_TABLET | Freq: Every day | ORAL | Status: DC

## 2011-08-07 NOTE — Telephone Encounter (Signed)
Rf req for Hydroco/APAP 5/325 mg 1 po qid prn. Ok to Rf?

## 2011-08-07 NOTE — Telephone Encounter (Signed)
OK to fill this prescription with additional refills x1 Thank you!  

## 2011-08-08 MED ORDER — HYDROCODONE-ACETAMINOPHEN 5-325 MG PO TABS: 1.0000 | ORAL_TABLET | Freq: Four times a day (QID) | ORAL | Status: DC

## 2011-08-08 NOTE — Telephone Encounter (Signed)
Done

## 2011-08-10 ENCOUNTER — Other Ambulatory Visit: Payer: Self-pay | Admitting: *Deleted

## 2011-08-10 MED ORDER — METFORMIN HCL 500 MG PO TABS: 500.0000 mg | ORAL_TABLET | Freq: Two times a day (BID) | ORAL | Status: DC

## 2011-08-10 MED ORDER — OMEPRAZOLE 40 MG PO CPDR: 40.0000 mg | DELAYED_RELEASE_CAPSULE | ORAL | Status: DC

## 2011-08-16 ENCOUNTER — Encounter (HOSPITAL_COMMUNITY): Payer: Self-pay | Admitting: *Deleted

## 2011-08-16 ENCOUNTER — Other Ambulatory Visit: Payer: Self-pay | Admitting: Internal Medicine

## 2011-08-16 ENCOUNTER — Emergency Department (HOSPITAL_COMMUNITY)
Admission: EM | Admit: 2011-08-16 | Discharge: 2011-08-17 | Disposition: A | Discharge status: Home / Self Care | Payer: Medicare PPO | Attending: Emergency Medicine | Admitting: Emergency Medicine

## 2011-08-16 DIAGNOSIS — R51 Headache: Secondary | ICD-10-CM | POA: Insufficient documentation

## 2011-08-16 DIAGNOSIS — I10 Essential (primary) hypertension: Secondary | ICD-10-CM | POA: Insufficient documentation

## 2011-08-16 DIAGNOSIS — R5381 Other malaise: Secondary | ICD-10-CM | POA: Insufficient documentation

## 2011-08-16 DIAGNOSIS — R42 Dizziness and giddiness: Secondary | ICD-10-CM | POA: Insufficient documentation

## 2011-08-16 DIAGNOSIS — R6883 Chills (without fever): Secondary | ICD-10-CM | POA: Insufficient documentation

## 2011-08-16 DIAGNOSIS — E669 Obesity, unspecified: Secondary | ICD-10-CM | POA: Insufficient documentation

## 2011-08-16 DIAGNOSIS — K219 Gastro-esophageal reflux disease without esophagitis: Secondary | ICD-10-CM | POA: Insufficient documentation

## 2011-08-16 DIAGNOSIS — E785 Hyperlipidemia, unspecified: Secondary | ICD-10-CM | POA: Insufficient documentation

## 2011-08-16 DIAGNOSIS — H9209 Otalgia, unspecified ear: Secondary | ICD-10-CM | POA: Insufficient documentation

## 2011-08-16 DIAGNOSIS — R5383 Other fatigue: Secondary | ICD-10-CM | POA: Insufficient documentation

## 2011-08-16 DIAGNOSIS — E039 Hypothyroidism, unspecified: Secondary | ICD-10-CM | POA: Insufficient documentation

## 2011-08-16 DIAGNOSIS — R112 Nausea with vomiting, unspecified: Secondary | ICD-10-CM | POA: Insufficient documentation
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
## 2011-08-16 NOTE — ED Notes (Addendum)
Pt awoke feeling nauseated and vomited twice.  She came in b/c she took her bp at home and it was 199/90.    Pt also c/o L sided headache after having laser surgery on her L eye on Monday.

## 2011-08-17 ENCOUNTER — Emergency Department (HOSPITAL_COMMUNITY): Payer: Medicare PPO

## 2011-08-17 ENCOUNTER — Other Ambulatory Visit: Payer: Self-pay | Admitting: *Deleted

## 2011-08-17 ENCOUNTER — Encounter (HOSPITAL_COMMUNITY): Payer: Self-pay | Admitting: Radiology

## 2011-08-17 LAB — COMPREHENSIVE METABOLIC PANEL
ALT: 27 U/L (ref 0–35)
AST: 28 U/L (ref 0–37)
Albumin: 4.1 g/dL (ref 3.5–5.2)
Alkaline Phosphatase: 96 U/L (ref 39–117)
BUN: 18 mg/dL (ref 6–23)
Chloride: 101 mEq/L (ref 96–112)
Potassium: 3.9 mEq/L (ref 3.5–5.1)
Sodium: 138 mEq/L (ref 135–145)
Total Bilirubin: 0.4 mg/dL (ref 0.3–1.2)
Total Protein: 8.6 g/dL — ABNORMAL HIGH (ref 6.0–8.3)

## 2011-08-17 LAB — CBC
HCT: 44.5 % (ref 36.0–46.0)
MCV: 89.7 fL (ref 78.0–100.0)
RBC: 4.96 MIL/uL (ref 3.87–5.11)
RDW: 14.1 % (ref 11.5–15.5)
WBC: 9.7 10*3/uL (ref 4.0–10.5)

## 2011-08-17 LAB — DIFFERENTIAL
Basophils Absolute: 0 10*3/uL (ref 0.0–0.1)
Basophils Relative: 0 % (ref 0–1)
Eosinophils Absolute: 0.2 10*3/uL (ref 0.0–0.7)
Monocytes Relative: 9 % (ref 3–12)

## 2011-08-17 LAB — TROPONIN I: Troponin I: <0.3 ng/mL (ref <0.30)

## 2011-08-17 MED ORDER — ONDANSETRON HCL 4 MG/2ML IJ SOLN
4.0000 mg | Freq: Once | INTRAMUSCULAR | Status: AC
  Administered 2011-08-17: 4 mg via INTRAVENOUS
  Filled 2011-08-17 (×2): qty 2

## 2011-08-17 MED ORDER — SODIUM CHLORIDE 0.9 % IV BOLUS (SEPSIS)
500.0000 mL | Freq: Once | INTRAVENOUS | Status: AC
  Administered 2011-08-17: 500 mL via INTRAVENOUS

## 2011-08-17 NOTE — Telephone Encounter (Signed)
Please advise refills:  promethazine (PHENERGAN) 25 MG tablet [Pharmacy Med Name: PROMETHAZINE 25 MG TABLET] The source prescription has been discontinued. TAKE 1 TABLET 4 TIMES A DAY AS NEEDED Disp: 60 tablet R: 0 Start: 08/16/2011 Class: Normal Non-formulary Requested on: 02/28/2010 Originally ordered on: 06/15/2010 Last refill: 02/28/2010

## 2011-08-17 NOTE — ED Notes (Signed)
PT'S SON SAID TO CALL HIM WHEN THE PT IS ABOUT TO BE DISCHARGED.  STEVE   [phone]

## 2011-08-17 NOTE — ED Notes (Signed)
Pt stated she was waiting 20 more seconds for her discharge papers then she was leaving. Asked pt to wait a momenet and I would ask Dr Hyman Hopes how much longer it would be until she had them ready. Pt stated "times up, I'm leaving" and left with her son ambulatory and in no apparent distress. Dr Hyman Hopes notified that pt left without discharge vitals or discharge instructions.

## 2011-08-17 NOTE — Discharge Instructions (Signed)
Nausea and Vomiting Nausea is a sick feeling that often comes before throwing up (vomiting). Vomiting is a reflex where stomach contents come out of your mouth. Vomiting can cause severe loss of body fluids (dehydration). Children and elderly adults can become dehydrated quickly, especially if they also have diarrhea. Nausea and vomiting are symptoms of a condition or disease. It is important to find the cause of your symptoms. CAUSES   Direct irritation of the stomach lining. This irritation can result from increased acid production (gastroesophageal reflux disease), infection, food poisoning, taking certain medicines (such as nonsteroidal anti-inflammatory drugs), alcohol use, or tobacco use.   Signals from the brain.These signals could be caused by a headache, heat exposure, an inner ear disturbance, increased pressure in the brain from injury, infection, a tumor, or a concussion, pain, emotional stimulus, or metabolic problems.   An obstruction in the gastrointestinal tract (bowel obstruction).   Illnesses such as diabetes, hepatitis, gallbladder problems, appendicitis, kidney problems, cancer, sepsis, atypical symptoms of a heart attack, or eating disorders.   Medical treatments such as chemotherapy and radiation.   Receiving medicine that makes you sleep (general anesthetic) during surgery.  DIAGNOSIS Your caregiver may ask for tests to be done if the problems do not improve after a few days. Tests may also be done if symptoms are severe or if the reason for the nausea and vomiting is not clear. Tests may include:  Urine tests.   Blood tests.   Stool tests.   Cultures (to look for evidence of infection).   X-rays or other imaging studies.  Test results can help your caregiver make decisions about treatment or the need for additional tests. TREATMENT You need to stay well hydrated. Drink frequently but in small amounts.You may wish to drink water, sports drinks, clear broth, or  eat frozen ice pops or gelatin dessert to help stay hydrated.When you eat, eating slowly may help prevent nausea.There are also some antinausea medicines that may help prevent nausea. HOME CARE INSTRUCTIONS   Take all medicine as directed by your caregiver.   If you do not have an appetite, do not force yourself to eat. However, you must continue to drink fluids.   If you have an appetite, eat a normal diet unless your caregiver tells you differently.   Eat a variety of complex carbohydrates (rice, wheat, potatoes, bread), lean meats, yogurt, fruits, and vegetables.   Avoid high-fat foods because they are more difficult to digest.   Drink enough water and fluids to keep your urine clear or pale yellow.   If you are dehydrated, ask your caregiver for specific rehydration instructions. Signs of dehydration may include:   Severe thirst.   Dry lips and mouth.   Dizziness.   Dark urine.   Decreasing urine frequency and amount.   Confusion.   Rapid breathing or pulse.  SEEK IMMEDIATE MEDICAL CARE IF:   You have blood or brown flecks (like coffee grounds) in your vomit.   You have black or bloody stools.   You have a severe headache or stiff neck.   You are confused.   You have severe abdominal pain.   You have chest pain or trouble breathing.   You do not urinate at least once every 8 hours.   You develop cold or clammy skin.   You continue to vomit for longer than 24 to 48 hours.   You have a fever.  MAKE SURE YOU:   Understand these instructions.   Will watch your   condition.   Will get help right away if you are not doing well or get worse.  Document Released: 03/20/2005 Document Revised: 03/09/2011 Document Reviewed: 08/17/2010 Exit[employer] Patient Information 2012 [employer]  RESOURCE GUIDE  Dental Problems  Patients with Medicaid: [facility]                      Dental [address]                                            [address] Phone:  [phone]                                                   Phone:  [phone]  If unable to pay or uninsured, contact:  [facility] or [facility] to become qualified for the adult dental clinic.  Chronic Pain Problems Contact [facility]  [phone] Patients need to be referred by their primary care doctor.  Insufficient Money for Medicine Contact [employer]:  call "211" or [facility] 271-5999.  No Primary Care Doctor Call [facility]  832-8000 Other agencies that provide inexpensive medical care    [facility]  [phone]    [facility]  [phone]    [facility]  [phone]    [facility]  [phone]    [facility]  [phone]    [facility]  [phone]  [facility]  Health  [phone] [facility]  [phone] [facility]   [phone] (emergency services 641-4993)  Abuse/Neglect [place] Child Abuse Hotline (336) 641-3795 [place] [facility] Abuse Hotline 800-378-5315 (After Hours)  Emergency Shelter [facility] (336) 271-5985  [facility] (336) 275-9566 [facility] (704) 372-4663  MRSA Hotline #:   832-7006    [place] [facility]  [facility] [place] [facility]  [employer]                           [facility] [address] [place]                     [address]         [address]  [place]                                               [place]                              [place] Phone:  [phone]                                  Phone:  [phone]                   Phone:  [phone]    [facility] Phone:  [phone]  [place] Child Abuse Hotline (336) 342-1394 (336) 342-3537 (After Hours)  

## 2011-08-17 NOTE — Telephone Encounter (Signed)
OK to fill this prescription with additional refills x0 Thank you!  

## 2011-08-17 NOTE — ED Notes (Signed)
Pt was able to self ambulate up and down the halls with no problem.

## 2011-08-18 ENCOUNTER — Encounter: Payer: Self-pay | Admitting: Internal Medicine

## 2011-08-18 ENCOUNTER — Ambulatory Visit (INDEPENDENT_AMBULATORY_CARE_PROVIDER_SITE_OTHER): Payer: 59 | Admitting: Internal Medicine

## 2011-08-18 VITALS — BP 150/90 | HR 88 | Temp 98.1°F | Resp 16 | Wt 260.0 lb

## 2011-08-18 DIAGNOSIS — R0683 Snoring: Secondary | ICD-10-CM

## 2011-08-18 DIAGNOSIS — F3289 Other specified depressive episodes: Secondary | ICD-10-CM

## 2011-08-18 DIAGNOSIS — K219 Gastro-esophageal reflux disease without esophagitis: Secondary | ICD-10-CM | POA: Insufficient documentation

## 2011-08-18 DIAGNOSIS — R0989 Other specified symptoms and signs involving the circulatory and respiratory systems: Secondary | ICD-10-CM

## 2011-08-18 DIAGNOSIS — R0609 Other forms of dyspnea: Secondary | ICD-10-CM

## 2011-08-18 DIAGNOSIS — F411 Generalized anxiety disorder: Secondary | ICD-10-CM

## 2011-08-18 DIAGNOSIS — I10 Essential (primary) hypertension: Secondary | ICD-10-CM

## 2011-08-18 DIAGNOSIS — E119 Type 2 diabetes mellitus without complications: Secondary | ICD-10-CM

## 2011-08-18 DIAGNOSIS — F329 Major depressive disorder, single episode, unspecified: Secondary | ICD-10-CM

## 2011-08-18 MED ORDER — VENLAFAXINE HCL ER 75 MG PO CP24: 75.0000 mg | ORAL_CAPSULE | Freq: Every day | ORAL | Status: DC

## 2011-08-18 MED ORDER — PROMETHAZINE HCL 25 MG PO TABS: 25.0000 mg | ORAL_TABLET | Freq: Four times a day (QID) | ORAL | Status: DC | PRN

## 2011-08-18 MED ORDER — DEXLANSOPRAZOLE 30 MG PO CPDR: 30.0000 mg | DELAYED_RELEASE_CAPSULE | Freq: Every day | ORAL | Status: DC

## 2011-08-18 NOTE — Assessment & Plan Note (Signed)
Chronic Not well controlled Risks associated with diet and treatment noncompliance were discussed. Compliance was encouraged.

## 2011-08-18 NOTE — Assessment & Plan Note (Signed)
Chronic  5/13 worse, r/o PUD Samples of Dexilant 30 mg qd given RTC next wk if not better

## 2011-08-18 NOTE — Assessment & Plan Note (Signed)
Continue with current prescription therapy as reflected on the Med list.  

## 2011-08-18 NOTE — Assessment & Plan Note (Signed)
Chronic  5/13 worse, re-start Effexor

## 2011-08-18 NOTE — Telephone Encounter (Signed)
Refill left on pharmacy voicemail #[phone] x no refills. 

## 2011-08-18 NOTE — Assessment & Plan Note (Signed)
Worse - re-start Effexor (stopped it a few days ago) Continue with current prescription therapy as reflected on the Med list.

## 2011-08-18 NOTE — Progress Notes (Signed)
Subjective:    Patient ID: Jessica [name], female    DOB: [birth year], 78 y.o.   MRN: [id number]  Emesis  Associated symptoms include arthralgias. Pertinent negatives include no abdominal pain, chills, coughing, diarrhea, dizziness or headaches.  Hip Pain  Pertinent negatives include no numbness.   C/o nausea and vomiting, GERD sx's and stress. She went to ER on 5/15  The patient presents for a follow-up of  chronic hypertension, chronic dyslipidemia, type 2 diabetes controlled with medicines. C/o LBP. C/o L foot pain - chronic She is stressed with her brother being sick in [place] - AVR w/complications; her son had an MI. She has no GP since 26 yo   BP Readings from Last 3 Encounters:  08/18/11 150/90  08/17/11 137/73  07/10/11 120/90   Wt Readings from Last 3 Encounters:  08/18/11 260 lb (117.935 kg)  08/16/11 265 lb (120.203 kg)  07/10/11 263 lb (119.296 kg)         Review of Systems  Constitutional: Positive for fatigue. Negative for chills, activity change, appetite change and unexpected weight change.  HENT: Negative for congestion, mouth sores and sinus pressure.   Eyes: Negative for visual disturbance.  Respiratory: Negative for cough and chest tightness.   Cardiovascular: Negative for leg swelling.  Gastrointestinal: Positive for nausea and vomiting. Negative for abdominal pain and diarrhea.  Genitourinary: Negative for frequency, difficulty urinating and vaginal pain.  Musculoskeletal: Positive for back pain, arthralgias and gait problem.  Skin: Negative for pallor and rash.  Neurological: Negative for dizziness, tremors, weakness, numbness and headaches.  Psychiatric/Behavioral: Negative for suicidal ideas, confusion and sleep disturbance. The patient is nervous/anxious.        Objective:   Physical Exam  Constitutional: She appears well-developed. No distress.       Obese NAD  HENT:  Head: Normocephalic.  Right Ear: External ear normal.  Left Ear:  External ear normal.  Nose: Nose normal.  Mouth/Throat: Oropharynx is clear and moist.  Eyes: Conjunctivae are normal. Pupils are equal, round, and reactive to light. Right eye exhibits no discharge. Left eye exhibits no discharge.  Neck: Normal range of motion. Neck supple. No JVD present. No tracheal deviation present. No thyromegaly present.  Cardiovascular: Normal rate, regular rhythm and normal heart sounds.   Pulmonary/Chest: No stridor. No respiratory distress. She has no wheezes.  Abdominal: Soft. Bowel sounds are normal. She exhibits no distension and no mass. There is no tenderness. There is no rebound and no guarding.  Musculoskeletal: She exhibits tenderness (LS spine, L dors foot). She exhibits no edema.  Lymphadenopathy:    She has no cervical adenopathy.  Neurological: She displays normal reflexes. No cranial nerve deficit. She exhibits normal muscle tone. Coordination normal.  Skin: No rash noted. No erythema.  Psychiatric: Her behavior is normal. Judgment and thought content normal.       upset    Lab Results  Component Value Date   WBC 9.7 08/17/2011   HGB 15.2* 08/17/2011   HCT 44.5 08/17/2011   PLT 346 08/17/2011   GLUCOSE 145* 08/17/2011   CHOL 203* 08/26/2010   TRIG 175.0* 08/26/2010   HDL 44.00 08/26/2010   LDLDIRECT 159.1 08/26/2010   ALT 27 08/17/2011   AST 28 08/17/2011   NA 138 08/17/2011   K 3.9 08/17/2011   CL 101 08/17/2011   CREATININE 0.65 08/17/2011   BUN 18 08/17/2011   CO2 24 08/17/2011   TSH 3.62 03/31/2011   INR 3.3 08/26/2009  HGBA1C 6.7* 07/04/2011   MICROALBUR 4.5* 12/02/2009         Assessment & Plan:

## 2011-09-13 ENCOUNTER — Ambulatory Visit: Payer: Medicare PPO | Admitting: Internal Medicine

## 2011-09-25 ENCOUNTER — Encounter: Payer: Self-pay | Admitting: Internal Medicine

## 2011-09-25 ENCOUNTER — Ambulatory Visit (INDEPENDENT_AMBULATORY_CARE_PROVIDER_SITE_OTHER): Payer: 59 | Admitting: Internal Medicine

## 2011-09-25 VITALS — BP 140/80 | HR 80 | Temp 97.7°F | Resp 16 | Wt 268.0 lb

## 2011-09-25 DIAGNOSIS — I10 Essential (primary) hypertension: Secondary | ICD-10-CM

## 2011-09-25 DIAGNOSIS — E119 Type 2 diabetes mellitus without complications: Secondary | ICD-10-CM

## 2011-09-25 DIAGNOSIS — N259 Disorder resulting from impaired renal tubular function, unspecified: Secondary | ICD-10-CM

## 2011-09-25 DIAGNOSIS — R635 Abnormal weight gain: Secondary | ICD-10-CM

## 2011-09-25 DIAGNOSIS — M545 Low back pain: Secondary | ICD-10-CM

## 2011-09-25 DIAGNOSIS — F329 Major depressive disorder, single episode, unspecified: Secondary | ICD-10-CM

## 2011-09-25 MED ORDER — TRIAMCINOLONE ACETONIDE 0.5 % EX CREA: TOPICAL_CREAM | Freq: Three times a day (TID) | CUTANEOUS | Status: AC

## 2011-09-25 NOTE — Assessment & Plan Note (Signed)
Continue with current prescription therapy as reflected on the Med list. Labs  

## 2011-09-25 NOTE — Assessment & Plan Note (Signed)
Watching  Diet

## 2011-09-25 NOTE — Assessment & Plan Note (Signed)
Watching  

## 2011-09-25 NOTE — Assessment & Plan Note (Signed)
Continue with current prescription therapy as reflected on the Med list.  

## 2011-09-25 NOTE — Progress Notes (Signed)
Patient ID: Jessica [name], female   DOB: 1934-02-26, 78 y.o.   MRN: [id number]  Subjective:    Patient ID: Jessica [name], female    DOB: 1933-12-01, 78 y.o.   MRN: [id number]  HPI The patient presents for a follow-up of  chronic hypertension, chronic dyslipidemia, type 2 diabetes controlled with medicines. C/o LBP. C/o L foot pain - chronic, better C/o R ankle skin lesion  BP Readings from Last 3 Encounters:  09/25/11 140/80  08/18/11 150/90  08/17/11 137/73   Wt Readings from Last 3 Encounters:  09/25/11 268 lb (121.564 kg)  08/18/11 260 lb (117.935 kg)  08/16/11 265 lb (120.203 kg)       Review of Systems  Constitutional: Negative for chills, activity change, appetite change, fatigue and unexpected weight change.  HENT: Negative for congestion, mouth sores and sinus pressure.   Eyes: Negative for visual disturbance.  Respiratory: Negative for cough and chest tightness.   Cardiovascular: Negative for leg swelling.  Gastrointestinal: Negative for nausea and abdominal pain.  Genitourinary: Negative for frequency, difficulty urinating and vaginal pain.  Musculoskeletal: Positive for back pain, arthralgias and gait problem.  Skin: Negative for pallor and rash.  Neurological: Negative for dizziness, tremors, weakness, numbness and headaches.  Psychiatric/Behavioral: Negative for suicidal ideas, confusion and disturbed wake/sleep cycle. The patient is nervous/anxious.        Objective:   Physical Exam  Constitutional: She appears well-developed.  HENT:  Head: Normocephalic.  Right Ear: External ear normal.  Left Ear: External ear normal.  Nose: Nose normal.  Mouth/Throat: Oropharynx is clear and moist.  Eyes: Conjunctivae are normal. Pupils are equal, round, and reactive to light. Right eye exhibits no discharge. Left eye exhibits no discharge.  Neck: Normal range of motion. Neck supple. No JVD present. No tracheal deviation present. No thyromegaly present.    Cardiovascular: Normal rate, regular rhythm and normal heart sounds.   Pulmonary/Chest: No stridor. No respiratory distress. She has no wheezes.  Abdominal: Soft. Bowel sounds are normal. She exhibits no distension and no mass. There is no tenderness. There is no rebound and no guarding.  Musculoskeletal: She exhibits tenderness (LS spine, L dors foot). She exhibits no edema.  Lymphadenopathy:    She has no cervical adenopathy.  Neurological: She displays normal reflexes. No cranial nerve deficit. She exhibits normal muscle tone. Coordination normal.  Skin: No rash noted. No erythema.  Psychiatric: She has a normal mood and affect. Her behavior is normal. Judgment and thought content normal.  L achilles tendon area with 12 mm round patch  Lab Results  Component Value Date   WBC 9.7 08/17/2011   HGB 15.2* 08/17/2011   HCT 44.5 08/17/2011   PLT 346 08/17/2011   GLUCOSE 145* 08/17/2011   CHOL 203* 08/26/2010   TRIG 175.0* 08/26/2010   HDL 44.00 08/26/2010   LDLDIRECT 159.1 08/26/2010   ALT 27 08/17/2011   AST 28 08/17/2011   NA 138 08/17/2011   K 3.9 08/17/2011   CL 101 08/17/2011   CREATININE 0.65 08/17/2011   BUN 18 08/17/2011   CO2 24 08/17/2011   TSH 3.62 03/31/2011   INR 3.3 08/26/2009   HGBA1C 6.7* 07/04/2011   MICROALBUR 4.5* 12/02/2009         Assessment & Plan:

## 2011-09-25 NOTE — Patient Instructions (Signed)
Wt Readings from Last 3 Encounters:  09/25/11 268 lb (121.564 kg)  08/18/11 260 lb (117.935 kg)  08/16/11 265 lb (120.203 kg)   BP Readings from Last 3 Encounters:  09/25/11 140/80  08/18/11 150/90  08/17/11 137/73

## 2011-10-26 ENCOUNTER — Telehealth: Payer: Self-pay | Admitting: *Deleted

## 2011-10-26 NOTE — Telephone Encounter (Signed)
Ok both, no ref Thx

## 2011-10-26 NOTE — Telephone Encounter (Signed)
Rf req for Lorazepam 1 mg 1 po bid prn # 180 and Temazepam 15 mg 1 po qhs prn. # 90. Ok to Rf?

## 2011-10-27 MED ORDER — TEMAZEPAM 15 MG PO CAPS: 15.0000 mg | ORAL_CAPSULE | Freq: Every evening | ORAL | Status: DC | PRN

## 2011-10-27 MED ORDER — LORAZEPAM 1 MG PO TABS: 1.0000 mg | ORAL_TABLET | Freq: Two times a day (BID) | ORAL | Status: DC | PRN

## 2011-10-27 NOTE — Telephone Encounter (Signed)
Done

## 2011-10-30 ENCOUNTER — Telehealth: Payer: Self-pay | Admitting: *Deleted

## 2011-10-30 NOTE — Telephone Encounter (Signed)
OK to fill this prescription with additional refills x0 Thank you!  

## 2011-10-30 NOTE — Telephone Encounter (Signed)
Rf req for Hydroco/APAP 5/325 1 po qid prn. Ok to Rf?

## 2011-10-31 MED ORDER — HYDROCODONE-ACETAMINOPHEN 5-325 MG PO TABS: 1.0000 | ORAL_TABLET | Freq: Four times a day (QID) | ORAL | Status: AC

## 2011-10-31 NOTE — Telephone Encounter (Signed)
Done

## 2011-11-09 ENCOUNTER — Ambulatory Visit: Payer: 59 | Admitting: Internal Medicine

## 2011-11-28 ENCOUNTER — Other Ambulatory Visit: Payer: Self-pay | Admitting: *Deleted

## 2011-11-28 MED ORDER — ALLOPURINOL 100 MG PO TABS: 100.0000 mg | ORAL_TABLET | Freq: Every day | ORAL | Status: DC

## 2011-12-08 ENCOUNTER — Other Ambulatory Visit: Payer: Self-pay | Admitting: *Deleted

## 2011-12-08 MED ORDER — TEMAZEPAM 15 MG PO CAPS: 15.0000 mg | ORAL_CAPSULE | Freq: Every evening | ORAL | Status: DC | PRN

## 2011-12-20 ENCOUNTER — Other Ambulatory Visit (INDEPENDENT_AMBULATORY_CARE_PROVIDER_SITE_OTHER): Payer: Medicare Other

## 2011-12-20 DIAGNOSIS — R635 Abnormal weight gain: Secondary | ICD-10-CM

## 2011-12-20 DIAGNOSIS — E119 Type 2 diabetes mellitus without complications: Secondary | ICD-10-CM

## 2011-12-20 DIAGNOSIS — M545 Low back pain, unspecified: Secondary | ICD-10-CM

## 2011-12-20 DIAGNOSIS — E785 Hyperlipidemia, unspecified: Secondary | ICD-10-CM

## 2011-12-20 DIAGNOSIS — E039 Hypothyroidism, unspecified: Secondary | ICD-10-CM

## 2011-12-20 DIAGNOSIS — N259 Disorder resulting from impaired renal tubular function, unspecified: Secondary | ICD-10-CM

## 2011-12-20 DIAGNOSIS — F411 Generalized anxiety disorder: Secondary | ICD-10-CM

## 2011-12-20 DIAGNOSIS — F329 Major depressive disorder, single episode, unspecified: Secondary | ICD-10-CM

## 2011-12-20 DIAGNOSIS — F3289 Other specified depressive episodes: Secondary | ICD-10-CM

## 2011-12-20 LAB — TSH: TSH: 0.87 u[IU]/mL (ref 0.35–5.50)

## 2011-12-20 LAB — HEPATIC FUNCTION PANEL
AST: 61 U/L — ABNORMAL HIGH (ref 0–37)
Alkaline Phosphatase: 78 U/L (ref 39–117)
Total Bilirubin: 0.4 mg/dL (ref 0.3–1.2)

## 2011-12-20 LAB — LIPID PANEL: Total CHOL/HDL Ratio: 6

## 2011-12-20 LAB — BASIC METABOLIC PANEL
Chloride: 101 mEq/L (ref 96–112)
Creatinine, Ser: 0.8 mg/dL (ref 0.4–1.2)
Potassium: 4.1 mEq/L (ref 3.5–5.1)

## 2011-12-20 LAB — HEMOGLOBIN A1C: Hgb A1c MFr Bld: 7 % — ABNORMAL HIGH (ref 4.6–6.5)

## 2011-12-20 LAB — LDL CHOLESTEROL, DIRECT: Direct LDL: 149 mg/dL

## 2011-12-28 ENCOUNTER — Ambulatory Visit (INDEPENDENT_AMBULATORY_CARE_PROVIDER_SITE_OTHER): Payer: Medicare Other | Admitting: Internal Medicine

## 2011-12-28 ENCOUNTER — Encounter: Payer: Self-pay | Admitting: Internal Medicine

## 2011-12-28 VITALS — BP 148/70 | HR 76 | Temp 96.8°F | Resp 16 | Wt 272.2 lb

## 2011-12-28 DIAGNOSIS — F411 Generalized anxiety disorder: Secondary | ICD-10-CM

## 2011-12-28 DIAGNOSIS — I10 Essential (primary) hypertension: Secondary | ICD-10-CM

## 2011-12-28 DIAGNOSIS — E119 Type 2 diabetes mellitus without complications: Secondary | ICD-10-CM

## 2011-12-28 DIAGNOSIS — R635 Abnormal weight gain: Secondary | ICD-10-CM

## 2011-12-28 DIAGNOSIS — N259 Disorder resulting from impaired renal tubular function, unspecified: Secondary | ICD-10-CM

## 2011-12-28 DIAGNOSIS — Z23 Encounter for immunization: Secondary | ICD-10-CM

## 2011-12-28 DIAGNOSIS — F3289 Other specified depressive episodes: Secondary | ICD-10-CM

## 2011-12-28 DIAGNOSIS — E039 Hypothyroidism, unspecified: Secondary | ICD-10-CM

## 2011-12-28 DIAGNOSIS — R0609 Other forms of dyspnea: Secondary | ICD-10-CM

## 2011-12-28 DIAGNOSIS — R0683 Snoring: Secondary | ICD-10-CM

## 2011-12-28 DIAGNOSIS — F329 Major depressive disorder, single episode, unspecified: Secondary | ICD-10-CM

## 2011-12-28 DIAGNOSIS — F4321 Adjustment disorder with depressed mood: Secondary | ICD-10-CM

## 2011-12-28 MED ORDER — TRAMADOL HCL 50 MG PO TABS: 50.0000 mg | ORAL_TABLET | Freq: Two times a day (BID) | ORAL | Status: DC | PRN

## 2011-12-28 MED ORDER — HYDROCODONE-ACETAMINOPHEN 7.5-325 MG PO TABS: 1.0000 | ORAL_TABLET | Freq: Three times a day (TID) | ORAL | Status: DC | PRN

## 2011-12-28 NOTE — Assessment & Plan Note (Signed)
Continue with current prescription therapy as reflected on the Med list.  

## 2011-12-28 NOTE — Assessment & Plan Note (Signed)
Worse - discussed diet 

## 2011-12-28 NOTE — Progress Notes (Signed)
  Subjective:    Patient ID: Jessica [name], female    DOB: 02-10-1934, 78 y.o.   MRN: [id number]  HPI The patient presents for a follow-up of  chronic hypertension, chronic dyslipidemia, type 2 diabetes controlled with medicines. C/o LBP. C/o L foot pain - chronic, better. C/o R foot cramps - driving to [place] a lot   BP Readings from Last 3 Encounters:  12/28/11 148/70  09/25/11 140/80  08/18/11 150/90   Wt Readings from Last 3 Encounters:  12/28/11 272 lb 4 oz (123.492 kg)  09/25/11 268 lb (121.564 kg)  08/18/11 260 lb (117.935 kg)       Review of Systems  Constitutional: Negative for chills, activity change, appetite change, fatigue and unexpected weight change.  HENT: Negative for congestion, mouth sores and sinus pressure.   Eyes: Negative for visual disturbance.  Respiratory: Negative for cough and chest tightness.   Cardiovascular: Negative for leg swelling.  Gastrointestinal: Negative for nausea and abdominal pain.  Genitourinary: Negative for frequency, difficulty urinating and vaginal pain.  Musculoskeletal: Positive for back pain, arthralgias and gait problem.  Skin: Negative for pallor and rash.  Neurological: Negative for dizziness, tremors, weakness, numbness and headaches.  Psychiatric/Behavioral: Negative for suicidal ideas, confusion and disturbed wake/sleep cycle. The patient is nervous/anxious.        Objective:   Physical Exam  Constitutional: She appears well-developed.  HENT:  Head: Normocephalic.  Right Ear: External ear normal.  Left Ear: External ear normal.  Nose: Nose normal.  Mouth/Throat: Oropharynx is clear and moist.  Eyes: Conjunctivae normal are normal. Pupils are equal, round, and reactive to light. Right eye exhibits no discharge. Left eye exhibits no discharge.  Neck: Normal range of motion. Neck supple. No JVD present. No tracheal deviation present. No thyromegaly present.  Cardiovascular: Normal rate, regular rhythm and normal  heart sounds.   Pulmonary/Chest: No stridor. No respiratory distress. She has no wheezes.  Abdominal: Soft. Bowel sounds are normal. She exhibits no distension and no mass. There is no tenderness. There is no rebound and no guarding.  Musculoskeletal: She exhibits tenderness (LS spine, L dors foot). She exhibits no edema.  Lymphadenopathy:    She has no cervical adenopathy.  Neurological: She displays normal reflexes. No cranial nerve deficit. She exhibits normal muscle tone. Coordination normal.  Skin: No rash noted. No erythema.  Psychiatric: She has a normal mood and affect. Her behavior is normal. Judgment and thought content normal.   L achilles tendon area with 12 mm round patch  Lab Results  Component Value Date   WBC 9.7 08/17/2011   HGB 15.2* 08/17/2011   HCT 44.5 08/17/2011   PLT 346 08/17/2011   GLUCOSE 98 12/20/2011   CHOL 227* 12/20/2011   TRIG 273.0* 12/20/2011   HDL 38.90* 12/20/2011   LDLDIRECT 149.0 12/20/2011   ALT 50* 12/20/2011   AST 61* 12/20/2011   NA 139 12/20/2011   K 4.1 12/20/2011   CL 101 12/20/2011   CREATININE 0.8 12/20/2011   BUN 16 12/20/2011   CO2 28 12/20/2011   TSH 0.87 12/20/2011   INR 3.3 08/26/2009   HGBA1C 7.0* 12/20/2011   MICROALBUR 4.5* 12/02/2009         Assessment & Plan:

## 2011-12-28 NOTE — Assessment & Plan Note (Signed)
Watching  

## 2011-12-28 NOTE — Assessment & Plan Note (Signed)
9/13 - agreed to have home sleep test

## 2011-12-31 ENCOUNTER — Encounter: Payer: Self-pay | Admitting: Internal Medicine

## 2011-12-31 NOTE — Assessment & Plan Note (Signed)
Discussed.

## 2012-01-11 ENCOUNTER — Telehealth: Payer: Self-pay | Admitting: Internal Medicine

## 2012-01-11 MED ORDER — LOSARTAN POTASSIUM 100 MG PO TABS: 100.0000 mg | ORAL_TABLET | Freq: Every day | ORAL | Status: DC

## 2012-01-11 MED ORDER — NABUMETONE 500 MG PO TABS: 500.0000 mg | ORAL_TABLET | Freq: Two times a day (BID) | ORAL | Status: DC

## 2012-01-11 MED ORDER — CARVEDILOL 12.5 MG PO TABS: 12.5000 mg | ORAL_TABLET | Freq: Two times a day (BID) | ORAL | Status: DC

## 2012-01-11 MED ORDER — TRIAMTERENE-HCTZ 37.5-25 MG PO TABS: 1.0000 | ORAL_TABLET | Freq: Every day | ORAL | Status: DC

## 2012-01-11 NOTE — Telephone Encounter (Signed)
The pt called and is stating she needs a stronger blood pressure medicine. She states her blood pressure is running around 200/93 throughout the week.  Her callback number is [phone]

## 2012-01-11 NOTE — Telephone Encounter (Signed)
Called pt, left detailed mess informing pt of below and to let me know if meds were sent to correct pharmacy.

## 2012-01-11 NOTE — Telephone Encounter (Signed)
Add coreg bid and Triamt/HCT qam RTC 2 wks Thx

## 2012-01-17 ENCOUNTER — Other Ambulatory Visit: Payer: Self-pay | Admitting: *Deleted

## 2012-01-17 MED ORDER — ALLOPURINOL 100 MG PO TABS: 100.0000 mg | ORAL_TABLET | Freq: Every day | ORAL | Status: DC

## 2012-01-25 ENCOUNTER — Ambulatory Visit (INDEPENDENT_AMBULATORY_CARE_PROVIDER_SITE_OTHER): Payer: Medicare Other | Admitting: Internal Medicine

## 2012-01-25 ENCOUNTER — Encounter: Payer: Self-pay | Admitting: Internal Medicine

## 2012-01-25 VITALS — BP 140/70 | HR 76 | Temp 97.7°F | Resp 16 | Wt 258.0 lb

## 2012-01-25 DIAGNOSIS — N259 Disorder resulting from impaired renal tubular function, unspecified: Secondary | ICD-10-CM

## 2012-01-25 DIAGNOSIS — R0989 Other specified symptoms and signs involving the circulatory and respiratory systems: Secondary | ICD-10-CM

## 2012-01-25 DIAGNOSIS — M545 Low back pain, unspecified: Secondary | ICD-10-CM

## 2012-01-25 DIAGNOSIS — E039 Hypothyroidism, unspecified: Secondary | ICD-10-CM

## 2012-01-25 DIAGNOSIS — L710 Perioral dermatitis: Secondary | ICD-10-CM | POA: Insufficient documentation

## 2012-01-25 DIAGNOSIS — M1990 Unspecified osteoarthritis, unspecified site: Secondary | ICD-10-CM

## 2012-01-25 DIAGNOSIS — E119 Type 2 diabetes mellitus without complications: Secondary | ICD-10-CM

## 2012-01-25 DIAGNOSIS — E785 Hyperlipidemia, unspecified: Secondary | ICD-10-CM

## 2012-01-25 DIAGNOSIS — R0683 Snoring: Secondary | ICD-10-CM

## 2012-01-25 DIAGNOSIS — I10 Essential (primary) hypertension: Secondary | ICD-10-CM

## 2012-01-25 DIAGNOSIS — L719 Rosacea, unspecified: Secondary | ICD-10-CM

## 2012-01-25 DIAGNOSIS — K219 Gastro-esophageal reflux disease without esophagitis: Secondary | ICD-10-CM

## 2012-01-25 MED ORDER — FUROSEMIDE 20 MG PO TABS: 20.0000 mg | ORAL_TABLET | Freq: Every day | ORAL | Status: DC | PRN

## 2012-01-25 MED ORDER — METRONIDAZOLE 0.75 % EX GEL: Freq: Two times a day (BID) | CUTANEOUS | Status: DC

## 2012-01-25 MED ORDER — LORAZEPAM 1 MG PO TABS: 1.0000 mg | ORAL_TABLET | Freq: Two times a day (BID) | ORAL | Status: DC | PRN

## 2012-01-25 NOTE — Assessment & Plan Note (Signed)
Continue with current prescription therapy as reflected on the Med list.  

## 2012-01-25 NOTE — Assessment & Plan Note (Signed)
Sleep test is pending ?

## 2012-01-25 NOTE — Progress Notes (Signed)
   Subjective:    Patient ID: Jessica [name], female    DOB: 03-Jul-1933, 78 y.o.   MRN: [id number]  HPI The patient presents for a follow-up of  chronic hypertension, chronic dyslipidemia, type 2 diabetes controlled with medicines. C/o LBP. C/o L foot pain - chronic, better. C/o R foot cramps - driving to [place] a lot She lost wt. She was not able to take Coreg - weak legs.   BP Readings from Last 3 Encounters:  01/25/12 140/70  12/28/11 148/70  09/25/11 140/80   Wt Readings from Last 3 Encounters:  01/25/12 258 lb (117.028 kg)  12/28/11 272 lb 4 oz (123.492 kg)  09/25/11 268 lb (121.564 kg)       Review of Systems  Constitutional: Negative for chills, activity change, appetite change, fatigue and unexpected weight change.  HENT: Negative for congestion, mouth sores and sinus pressure.   Eyes: Negative for visual disturbance.  Respiratory: Negative for cough and chest tightness.   Cardiovascular: Negative for leg swelling.  Gastrointestinal: Negative for nausea and abdominal pain.  Genitourinary: Negative for frequency, difficulty urinating and vaginal pain.  Musculoskeletal: Positive for back pain, arthralgias and gait problem.  Skin: Negative for pallor and rash.  Neurological: Negative for dizziness, tremors, weakness, numbness and headaches.  Psychiatric/Behavioral: Negative for suicidal ideas, confusion and disturbed wake/sleep cycle. The patient is nervous/anxious.        Objective:   Physical Exam  Constitutional: She appears well-developed.  HENT:  Head: Normocephalic.  Right Ear: External ear normal.  Left Ear: External ear normal.  Nose: Nose normal.  Mouth/Throat: Oropharynx is clear and moist.  Eyes: Conjunctivae normal are normal. Pupils are equal, round, and reactive to light. Right eye exhibits no discharge. Left eye exhibits no discharge.  Neck: Normal range of motion. Neck supple. No JVD present. No tracheal deviation present. No thyromegaly  present.  Cardiovascular: Normal rate, regular rhythm and normal heart sounds.   Pulmonary/Chest: No stridor. No respiratory distress. She has no wheezes.  Abdominal: Soft. Bowel sounds are normal. She exhibits no distension and no mass. There is no tenderness. There is no rebound and no guarding.  Musculoskeletal: She exhibits tenderness (LS spine, L dors foot). She exhibits no edema.  Lymphadenopathy:    She has no cervical adenopathy.  Neurological: She displays normal reflexes. No cranial nerve deficit. She exhibits normal muscle tone. Coordination normal.  Skin: No rash noted. No erythema.  Psychiatric: She has a normal mood and affect. Her behavior is normal. Judgment and thought content normal.   L achilles tendon area with 12 mm round patch  Lab Results  Component Value Date   WBC 9.7 08/17/2011   HGB 15.2* 08/17/2011   HCT 44.5 08/17/2011   PLT 346 08/17/2011   GLUCOSE 98 12/20/2011   CHOL 227* 12/20/2011   TRIG 273.0* 12/20/2011   HDL 38.90* 12/20/2011   LDLDIRECT 149.0 12/20/2011   ALT 50* 12/20/2011   AST 61* 12/20/2011   NA 139 12/20/2011   K 4.1 12/20/2011   CL 101 12/20/2011   CREATININE 0.8 12/20/2011   BUN 16 12/20/2011   CO2 28 12/20/2011   TSH 0.87 12/20/2011   INR 3.3 08/26/2009   HGBA1C 7.0* 12/20/2011   MICROALBUR 4.5* 12/02/2009         Assessment & Plan:

## 2012-01-25 NOTE — Assessment & Plan Note (Signed)
Metrogel

## 2012-01-25 NOTE — Assessment & Plan Note (Signed)
Watching labs 

## 2012-01-25 NOTE — Assessment & Plan Note (Signed)
Not on rx 

## 2012-01-25 NOTE — Assessment & Plan Note (Signed)
A little better 

## 2012-01-26 ENCOUNTER — Other Ambulatory Visit: Payer: Self-pay | Admitting: *Deleted

## 2012-01-30 ENCOUNTER — Ambulatory Visit (HOSPITAL_BASED_OUTPATIENT_CLINIC_OR_DEPARTMENT_OTHER): Payer: 59

## 2012-02-12 ENCOUNTER — Encounter: Payer: Self-pay | Admitting: Internal Medicine

## 2012-03-15 ENCOUNTER — Telehealth: Payer: Self-pay | Admitting: *Deleted

## 2012-03-15 MED ORDER — LORAZEPAM 1 MG PO TABS: 1.0000 mg | ORAL_TABLET | Freq: Two times a day (BID) | ORAL | Status: DC | PRN

## 2012-03-15 MED ORDER — TEMAZEPAM 15 MG PO CAPS: 15.0000 mg | ORAL_CAPSULE | Freq: Every evening | ORAL | Status: DC | PRN

## 2012-03-15 NOTE — Telephone Encounter (Signed)
Rxs printed. Faxed to number below.

## 2012-03-15 NOTE — Telephone Encounter (Signed)
Rf req for Lorazepam 1 mg and Temazepam 15 mg. Ok to Rf 90 day supply? Rxs must be faxed [phone].

## 2012-03-15 NOTE — Telephone Encounter (Signed)
OK to fill both prescription with additional refills x 6 mo Thank you!

## 2012-03-25 ENCOUNTER — Other Ambulatory Visit (INDEPENDENT_AMBULATORY_CARE_PROVIDER_SITE_OTHER): Payer: Medicare Other

## 2012-03-25 DIAGNOSIS — R0683 Snoring: Secondary | ICD-10-CM

## 2012-03-25 DIAGNOSIS — E119 Type 2 diabetes mellitus without complications: Secondary | ICD-10-CM

## 2012-03-25 DIAGNOSIS — F411 Generalized anxiety disorder: Secondary | ICD-10-CM

## 2012-03-25 DIAGNOSIS — F329 Major depressive disorder, single episode, unspecified: Secondary | ICD-10-CM

## 2012-03-25 DIAGNOSIS — F4321 Adjustment disorder with depressed mood: Secondary | ICD-10-CM

## 2012-03-25 DIAGNOSIS — Z23 Encounter for immunization: Secondary | ICD-10-CM

## 2012-03-25 DIAGNOSIS — R0989 Other specified symptoms and signs involving the circulatory and respiratory systems: Secondary | ICD-10-CM

## 2012-03-25 LAB — HEMOGLOBIN A1C: Hgb A1c MFr Bld: 7.3 % — ABNORMAL HIGH (ref 4.6–6.5)

## 2012-03-25 LAB — BASIC METABOLIC PANEL
Chloride: 103 mEq/L (ref 96–112)
Creatinine, Ser: 0.8 mg/dL (ref 0.4–1.2)
Potassium: 3.8 mEq/L (ref 3.5–5.1)
Sodium: 136 mEq/L (ref 135–145)

## 2012-03-25 LAB — HEPATIC FUNCTION PANEL
Albumin: 3.6 g/dL (ref 3.5–5.2)
Alkaline Phosphatase: 87 U/L (ref 39–117)
Total Protein: 7.4 g/dL (ref 6.0–8.3)

## 2012-04-01 ENCOUNTER — Ambulatory Visit (INDEPENDENT_AMBULATORY_CARE_PROVIDER_SITE_OTHER): Payer: Medicare Other | Admitting: Internal Medicine

## 2012-04-01 ENCOUNTER — Encounter: Payer: Self-pay | Admitting: Internal Medicine

## 2012-04-01 VITALS — BP 130/70 | HR 76 | Temp 97.7°F | Resp 16 | Wt 264.0 lb

## 2012-04-01 DIAGNOSIS — E119 Type 2 diabetes mellitus without complications: Secondary | ICD-10-CM

## 2012-04-01 DIAGNOSIS — K219 Gastro-esophageal reflux disease without esophagitis: Secondary | ICD-10-CM

## 2012-04-01 DIAGNOSIS — F329 Major depressive disorder, single episode, unspecified: Secondary | ICD-10-CM

## 2012-04-01 DIAGNOSIS — E039 Hypothyroidism, unspecified: Secondary | ICD-10-CM

## 2012-04-01 DIAGNOSIS — I10 Essential (primary) hypertension: Secondary | ICD-10-CM

## 2012-04-01 DIAGNOSIS — M25519 Pain in unspecified shoulder: Secondary | ICD-10-CM

## 2012-04-01 DIAGNOSIS — M25512 Pain in left shoulder: Secondary | ICD-10-CM | POA: Insufficient documentation

## 2012-04-01 DIAGNOSIS — R635 Abnormal weight gain: Secondary | ICD-10-CM

## 2012-04-01 MED ORDER — LORAZEPAM 1 MG PO TABS: 1.0000 mg | ORAL_TABLET | Freq: Two times a day (BID) | ORAL | Status: DC | PRN

## 2012-04-01 NOTE — Assessment & Plan Note (Signed)
Continue with current prescription therapy as reflected on the Med list.  

## 2012-04-01 NOTE — Progress Notes (Signed)
   Subjective:     HPI  The patient presents for a follow-up of  chronic hypertension, chronic dyslipidemia, type 2 diabetes controlled with medicines. C/o LBP. C/o L foot pain - chronic, better. C/o R foot cramps - driving to [place] a lot She fell down the steps on Thursday last wk. She was not able to take Coreg - weak legs.   BP Readings from Last 3 Encounters:  04/01/12 130/70  01/25/12 140/70  12/28/11 148/70   Wt Readings from Last 3 Encounters:  04/01/12 264 lb (119.75 kg)  01/25/12 258 lb (117.028 kg)  12/28/11 272 lb 4 oz (123.492 kg)       Review of Systems  Constitutional: Negative for chills, activity change, appetite change, fatigue and unexpected weight change.  HENT: Negative for congestion, mouth sores and sinus pressure.   Eyes: Negative for visual disturbance.  Respiratory: Negative for cough and chest tightness.   Cardiovascular: Negative for leg swelling.  Gastrointestinal: Negative for nausea and abdominal pain.  Genitourinary: Negative for frequency, difficulty urinating and vaginal pain.  Musculoskeletal: Positive for back pain, arthralgias and gait problem.  Skin: Negative for pallor and rash.  Neurological: Negative for dizziness, tremors, weakness, numbness and headaches.  Psychiatric/Behavioral: Negative for suicidal ideas, confusion and sleep disturbance. The patient is nervous/anxious.        Objective:   Physical Exam  Constitutional: She appears well-developed.  HENT:  Head: Normocephalic.  Right Ear: External ear normal.  Left Ear: External ear normal.  Nose: Nose normal.  Mouth/Throat: Oropharynx is clear and moist.  Eyes: Conjunctivae normal are normal. Pupils are equal, round, and reactive to light. Right eye exhibits no discharge. Left eye exhibits no discharge.  Neck: Normal range of motion. Neck supple. No JVD present. No tracheal deviation present. No thyromegaly present.  Cardiovascular: Normal rate, regular rhythm and normal  heart sounds.   Pulmonary/Chest: No stridor. No respiratory distress. She has no wheezes.  Abdominal: Soft. Bowel sounds are normal. She exhibits no distension and no mass. There is no tenderness. There is no rebound and no guarding.  Musculoskeletal: She exhibits tenderness (LS spine, L dors foot). She exhibits no edema.  Lymphadenopathy:    She has no cervical adenopathy.  Neurological: She displays normal reflexes. No cranial nerve deficit. She exhibits normal muscle tone. Coordination normal.  Skin: No rash noted. No erythema.  Psychiatric: She has a normal mood and affect. Her behavior is normal. Judgment and thought content normal.   L achilles tendon area with 12 mm round patch  Lab Results  Component Value Date   WBC 9.7 08/17/2011   HGB 15.2* 08/17/2011   HCT 44.5 08/17/2011   PLT 346 08/17/2011   GLUCOSE 108* 03/25/2012   CHOL 227* 12/20/2011   TRIG 273.0* 12/20/2011   HDL 38.90* 12/20/2011   LDLDIRECT 149.0 12/20/2011   ALT 39* 03/25/2012   AST 35 03/25/2012   NA 136 03/25/2012   K 3.8 03/25/2012   CL 103 03/25/2012   CREATININE 0.8 03/25/2012   BUN 15 03/25/2012   CO2 22 03/25/2012   TSH 0.87 12/20/2011   INR 3.3 08/26/2009   HGBA1C 7.3* 03/25/2012   MICROALBUR 4.5* 12/02/2009         Assessment & Plan:

## 2012-04-01 NOTE — Assessment & Plan Note (Signed)
Worse - wt loss need was discussed

## 2012-04-01 NOTE — Assessment & Plan Note (Signed)
Worse Loose wt 

## 2012-04-01 NOTE — Assessment & Plan Note (Signed)
Worse 

## 2012-04-01 NOTE — Assessment & Plan Note (Signed)
12/13 - s/p fall - contusion See meds

## 2012-04-12 ENCOUNTER — Encounter: Payer: Self-pay | Admitting: Internal Medicine

## 2012-04-12 ENCOUNTER — Ambulatory Visit (INDEPENDENT_AMBULATORY_CARE_PROVIDER_SITE_OTHER): Payer: Medicare Other | Admitting: Internal Medicine

## 2012-04-12 VITALS — BP 132/78 | HR 120 | Temp 97.9°F | Ht 62.5 in | Wt 265.5 lb

## 2012-04-12 DIAGNOSIS — J020 Streptococcal pharyngitis: Secondary | ICD-10-CM

## 2012-04-12 MED ORDER — AMOXICILLIN-POT CLAVULANATE 875-125 MG PO TABS: 1.0000 | ORAL_TABLET | Freq: Two times a day (BID) | ORAL | Status: DC
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
## 2012-04-12 NOTE — Patient Instructions (Signed)
Strep Throat Strep throat is an infection of the throat caused by a bacteria named Streptococcus pyogenes. Your caregiver may call the infection streptococcal "tonsillitis" or "pharyngitis" depending on whether there are signs of inflammation in the tonsils or back of the throat. Strep throat is most common in children from 5 to 77 years old during the cold months of the year, but it can occur in people of any age during any season. This infection is spread from person to person (contagious) through coughing, sneezing, or other close contact. SYMPTOMS   Fever or chills.  Painful, swollen, red tonsils or throat.  Pain or difficulty when swallowing.  White or yellow spots on the tonsils or throat.  Swollen, tender lymph nodes or "glands" of the neck or under the jaw.  Red rash all over the body (rare). DIAGNOSIS  Many different infections can cause the same symptoms. A test must be done to confirm the diagnosis so the right treatment can be given. A "rapid strep test" can help your caregiver make the diagnosis in a few minutes. If this test is not available, a light swab of the infected area can be used for a throat culture test. If a throat culture test is done, results are usually available in a day or two. TREATMENT  Strep throat is treated with antibiotic medicine. HOME CARE INSTRUCTIONS   Gargle with 1 tsp of salt in 1 cup of warm water, 3 to 4 times per day or as needed for comfort.  Family members who also have a sore throat or fever should be tested for strep throat and treated with antibiotics if they have the strep infection.  Make sure everyone in your household washes their hands well.  Do not share food, drinking cups, or personal items that could cause the infection to spread to others.  You may need to eat a soft food diet until your sore throat gets better.  Drink enough water and fluids to keep your urine clear or pale yellow. This will help prevent dehydration.  Get  plenty of rest.  Stay home from school, daycare, or work until you have been on antibiotics for 24 hours.  Only take over-the-counter or prescription medicines for pain, discomfort, or fever as directed by your caregiver.  If antibiotics are prescribed, take them as directed. Finish them even if you start to feel better. SEEK MEDICAL CARE IF:   The glands in your neck continue to enlarge.  You develop a rash, cough, or earache.  You cough up green, yellow-brown, or bloody sputum.  You have pain or discomfort not controlled by medicines.  Your problems seem to be getting worse rather than better. SEEK IMMEDIATE MEDICAL CARE IF:   You develop any new symptoms such as vomiting, severe headache, stiff or painful neck, chest pain, shortness of breath, or trouble swallowing.  You develop severe throat pain, drooling, or changes in your voice.  You develop swelling of the neck, or the skin on the neck becomes red and tender.  You have a fever.  You develop signs of dehydration, such as fatigue, dry mouth, and decreased urination.  You become increasingly sleepy, or you cannot wake up completely. Document Released: 03/17/2000 Document Revised: 06/12/2011 Document Reviewed: 05/19/2010 [employer] Patient Information 2013 [employer].  

## 2012-04-24 ENCOUNTER — Other Ambulatory Visit: Payer: Self-pay | Admitting: *Deleted

## 2012-04-24 ENCOUNTER — Other Ambulatory Visit: Payer: Self-pay | Admitting: Internal Medicine

## 2012-04-24 DIAGNOSIS — R05 Cough: Secondary | ICD-10-CM

## 2012-04-24 MED ORDER — HYDROCODONE-HOMATROPINE 5-1.5 MG/5ML PO SYRP: 5.0000 mL | ORAL_SOLUTION | Freq: Three times a day (TID) | ORAL | Status: DC | PRN

## 2012-04-24 NOTE — Telephone Encounter (Signed)
Ash, eRx sent in for Hycodan cough syrup Rene Kocher

## 2012-04-24 NOTE — Telephone Encounter (Signed)
Rx called into [facility], pt informed.

## 2012-04-24 NOTE — Telephone Encounter (Signed)
Pt states that she was seen recently by NP for strep throat and now has developed a cough. She states that Robitussin OTC is not working for her. Pt wants to know if a cough syrup can be sent to her pharmacy.

## 2012-05-21 ENCOUNTER — Other Ambulatory Visit: Payer: Self-pay | Admitting: Internal Medicine

## 2012-05-21 NOTE — Telephone Encounter (Signed)
Refill on sleep medicine.  Needs new rx sent to [facility].

## 2012-05-23 NOTE — Telephone Encounter (Signed)
She wants it sent to [facility].  She has changed from [facility].

## 2012-05-23 NOTE — Telephone Encounter (Signed)
Ok Thx 

## 2012-05-23 NOTE — Telephone Encounter (Signed)
Left detailed mess requesting clarification on which pharmacy this needs to be called in to. We have [facility] in chart. Need clarification

## 2012-05-24 MED ORDER — TEMAZEPAM 15 MG PO CAPS: 15.0000 mg | ORAL_CAPSULE | Freq: Every evening | ORAL | Status: DC | PRN

## 2012-05-24 NOTE — Telephone Encounter (Signed)
Rx printed/signed/faxed

## 2012-06-06 ENCOUNTER — Other Ambulatory Visit: Payer: Self-pay | Admitting: Internal Medicine

## 2012-06-12 NOTE — Telephone Encounter (Signed)
Needs ov

## 2012-06-20 ENCOUNTER — Other Ambulatory Visit (INDEPENDENT_AMBULATORY_CARE_PROVIDER_SITE_OTHER): Payer: Medicare Other

## 2012-06-20 DIAGNOSIS — I10 Essential (primary) hypertension: Secondary | ICD-10-CM

## 2012-06-20 DIAGNOSIS — M25512 Pain in left shoulder: Secondary | ICD-10-CM

## 2012-06-20 DIAGNOSIS — E039 Hypothyroidism, unspecified: Secondary | ICD-10-CM

## 2012-06-20 DIAGNOSIS — E119 Type 2 diabetes mellitus without complications: Secondary | ICD-10-CM

## 2012-06-20 DIAGNOSIS — K219 Gastro-esophageal reflux disease without esophagitis: Secondary | ICD-10-CM

## 2012-06-20 DIAGNOSIS — M25519 Pain in unspecified shoulder: Secondary | ICD-10-CM

## 2012-06-20 DIAGNOSIS — R635 Abnormal weight gain: Secondary | ICD-10-CM

## 2012-06-20 LAB — HEMOGLOBIN A1C: Hgb A1c MFr Bld: 7.5 % — ABNORMAL HIGH (ref 4.6–6.5)

## 2012-06-20 LAB — BASIC METABOLIC PANEL
Calcium: 9.8 mg/dL (ref 8.4–10.5)
Chloride: 101 mEq/L (ref 96–112)
Creatinine, Ser: 0.8 mg/dL (ref 0.4–1.2)
GFR: 78.48 mL/min (ref >60.00)

## 2012-07-01 ENCOUNTER — Ambulatory Visit (INDEPENDENT_AMBULATORY_CARE_PROVIDER_SITE_OTHER): Payer: Medicare Other | Admitting: Internal Medicine

## 2012-07-01 ENCOUNTER — Encounter: Payer: Self-pay | Admitting: Internal Medicine

## 2012-07-01 VITALS — BP 130/72 | HR 76 | Temp 97.6°F | Resp 16 | Wt 262.0 lb

## 2012-07-01 DIAGNOSIS — I10 Essential (primary) hypertension: Secondary | ICD-10-CM

## 2012-07-01 DIAGNOSIS — M545 Low back pain: Secondary | ICD-10-CM

## 2012-07-01 DIAGNOSIS — B079 Viral wart, unspecified: Secondary | ICD-10-CM | POA: Insufficient documentation

## 2012-07-01 DIAGNOSIS — K219 Gastro-esophageal reflux disease without esophagitis: Secondary | ICD-10-CM

## 2012-07-01 DIAGNOSIS — E119 Type 2 diabetes mellitus without complications: Secondary | ICD-10-CM

## 2012-07-01 MED ORDER — LEVOTHYROXINE SODIUM 75 MCG PO TABS: 75.0000 ug | ORAL_TABLET | Freq: Every day | ORAL | Status: DC

## 2012-07-01 MED ORDER — OMEPRAZOLE 40 MG PO CPDR: 40.0000 mg | DELAYED_RELEASE_CAPSULE | Freq: Every day | ORAL | Status: DC

## 2012-07-01 MED ORDER — ALLOPURINOL 100 MG PO TABS: 100.0000 mg | ORAL_TABLET | Freq: Every day | ORAL | Status: DC

## 2012-07-01 MED ORDER — TRIAMTERENE-HCTZ 37.5-25 MG PO TABS: 1.0000 | ORAL_TABLET | Freq: Every day | ORAL | Status: DC

## 2012-07-01 MED ORDER — VENLAFAXINE HCL ER 75 MG PO CP24: 75.0000 mg | ORAL_CAPSULE | Freq: Every day | ORAL | Status: DC

## 2012-07-01 MED ORDER — LORAZEPAM 1 MG PO TABS: 1.0000 mg | ORAL_TABLET | Freq: Two times a day (BID) | ORAL | Status: DC | PRN

## 2012-07-01 MED ORDER — LOSARTAN POTASSIUM 100 MG PO TABS: 100.0000 mg | ORAL_TABLET | Freq: Every day | ORAL | Status: DC

## 2012-07-01 MED ORDER — FUROSEMIDE 20 MG PO TABS: 20.0000 mg | ORAL_TABLET | Freq: Every day | ORAL | Status: DC | PRN

## 2012-07-01 MED ORDER — METFORMIN HCL 1000 MG PO TABS: 1000.0000 mg | ORAL_TABLET | Freq: Two times a day (BID) | ORAL | Status: DC

## 2012-07-01 NOTE — Patient Instructions (Signed)
   Postprocedure instructions :     Keep the wounds clean. You can wash them with liquid soap and water. Pat dry with gauze or a Kleenex tissue  Before applying antibiotic ointment and a Band-Aid.   You need to report immediately  if  any signs of infection develop.    

## 2012-07-01 NOTE — Progress Notes (Signed)
Subjective:     HPI  The patient presents for a follow-up of  chronic hypertension, chronic dyslipidemia, type 2 diabetes controlled with medicines. C/o LBP. F/u L foot pain - chronic, better. F/u R foot cramps - driving to [place] a lot. C/o R groin pain w/walking.  She was not able to take Coreg - weak legs. C/o warts  BP Readings from Last 3 Encounters:  07/01/12 130/72  04/12/12 132/78  04/01/12 130/70   Wt Readings from Last 3 Encounters:  07/01/12 262 lb (118.842 kg)  04/12/12 265 lb 8 oz (120.43 kg)  04/01/12 264 lb (119.75 kg)       Review of Systems  Constitutional: Negative for chills, activity change, appetite change, fatigue and unexpected weight change.  HENT: Negative for congestion, mouth sores and sinus pressure.   Eyes: Negative for visual disturbance.  Respiratory: Negative for cough and chest tightness.   Cardiovascular: Negative for leg swelling.  Gastrointestinal: Negative for nausea and abdominal pain.  Genitourinary: Negative for frequency, difficulty urinating and vaginal pain.  Musculoskeletal: Positive for back pain, arthralgias and gait problem.  Skin: Negative for pallor and rash.  Neurological: Negative for dizziness, tremors, weakness, numbness and headaches.  Psychiatric/Behavioral: Negative for suicidal ideas, confusion and sleep disturbance. The patient is nervous/anxious.        Objective:   Physical Exam  Constitutional: She appears well-developed.  HENT:  Head: Normocephalic.  Right Ear: External ear normal.  Left Ear: External ear normal.  Nose: Nose normal.  Mouth/Throat: Oropharynx is clear and moist.  Eyes: Conjunctivae are normal. Pupils are equal, round, and reactive to light. Right eye exhibits no discharge. Left eye exhibits no discharge.  Neck: Normal range of motion. Neck supple. No JVD present. No tracheal deviation present. No thyromegaly present.  Cardiovascular: Normal rate, regular rhythm and normal heart sounds.    Pulmonary/Chest: No stridor. No respiratory distress. She has no wheezes.  Abdominal: Soft. Bowel sounds are normal. She exhibits no distension and no mass. There is no tenderness. There is no rebound and no guarding.  Musculoskeletal: She exhibits tenderness (LS spine, L dors foot). She exhibits no edema.  Lymphadenopathy:    She has no cervical adenopathy.  Neurological: She displays normal reflexes. No cranial nerve deficit. She exhibits normal muscle tone. Coordination normal.  Skin: No rash noted. No erythema.  Psychiatric: She has a normal mood and affect. Her behavior is normal. Judgment and thought content normal.   L achilles tendon area with 12 mm round patch  Lab Results  Component Value Date   WBC 9.7 08/17/2011   HGB 15.2* 08/17/2011   HCT 44.5 08/17/2011   PLT 346 08/17/2011   GLUCOSE 237* 06/20/2012   CHOL 227* 12/20/2011   TRIG 273.0* 12/20/2011   HDL 38.90* 12/20/2011   LDLDIRECT 149.0 12/20/2011   ALT 39* 03/25/2012   AST 35 03/25/2012   NA 137 06/20/2012   K 4.4 06/20/2012   CL 101 06/20/2012   CREATININE 0.8 06/20/2012   BUN 17 06/20/2012   CO2 27 06/20/2012   TSH 0.87 12/20/2011   INR 3.3 08/26/2009   HGBA1C 7.5* 06/20/2012   MICROALBUR 4.5* 12/02/2009    Procedure Note :     Procedure : Cryosurgery   Indication:  Wart(s)    Risks including unsuccessful procedure , bleeding, infection, bruising, scar, a need for a repeat  procedure and others were explained to the patient in detail as well as the benefits. Informed consent was obtained verbally.  3 lesion(s)  on  R forearm  was/were treated with liquid nitrogen on a Q-tip in a usual fasion . Band-Aid was applied and antibiotic ointment was given for a later use.   Tolerated well. Complications none.   Postprocedure instructions :     Keep the wounds clean. You can wash them with liquid soap and water. Pat dry with gauze or a Kleenex tissue  Before applying antibiotic ointment and a Band-Aid.   You need to  report immediately  if  any signs of infection develop.         Assessment & Plan:

## 2012-07-01 NOTE — Assessment & Plan Note (Signed)
Continue with current prescription therapy as reflected on the Med list.  

## 2012-07-01 NOTE — Assessment & Plan Note (Signed)
Cryo  

## 2012-07-01 NOTE — Assessment & Plan Note (Signed)
Cont w/wt loss Increase Metformin to 1000 mg bid

## 2012-07-23 ENCOUNTER — Telehealth: Payer: Self-pay | Admitting: *Deleted

## 2012-07-23 NOTE — Telephone Encounter (Signed)
Rf req for Hydroco/APAP 7.5/325 1 po q 8 hr prn. # 90. Ok to Rf?

## 2012-07-23 NOTE — Telephone Encounter (Signed)
OK to fill this prescription with additional refills x1 Thank you!  

## 2012-07-24 MED ORDER — HYDROCODONE-ACETAMINOPHEN 7.5-325 MG PO TABS: 1.0000 | ORAL_TABLET | Freq: Three times a day (TID) | ORAL | Status: DC | PRN

## 2012-07-24 NOTE — Telephone Encounter (Signed)
Done

## 2012-09-30 ENCOUNTER — Other Ambulatory Visit (INDEPENDENT_AMBULATORY_CARE_PROVIDER_SITE_OTHER): Payer: Medicare Other

## 2012-09-30 ENCOUNTER — Ambulatory Visit (INDEPENDENT_AMBULATORY_CARE_PROVIDER_SITE_OTHER): Payer: Medicare Other | Admitting: Internal Medicine

## 2012-09-30 ENCOUNTER — Encounter: Payer: Self-pay | Admitting: Internal Medicine

## 2012-09-30 DIAGNOSIS — E785 Hyperlipidemia, unspecified: Secondary | ICD-10-CM

## 2012-09-30 DIAGNOSIS — F3289 Other specified depressive episodes: Secondary | ICD-10-CM

## 2012-09-30 DIAGNOSIS — R0683 Snoring: Secondary | ICD-10-CM

## 2012-09-30 DIAGNOSIS — I10 Essential (primary) hypertension: Secondary | ICD-10-CM

## 2012-09-30 DIAGNOSIS — F329 Major depressive disorder, single episode, unspecified: Secondary | ICD-10-CM

## 2012-09-30 DIAGNOSIS — M545 Low back pain, unspecified: Secondary | ICD-10-CM

## 2012-09-30 DIAGNOSIS — F411 Generalized anxiety disorder: Secondary | ICD-10-CM

## 2012-09-30 DIAGNOSIS — R0989 Other specified symptoms and signs involving the circulatory and respiratory systems: Secondary | ICD-10-CM

## 2012-09-30 DIAGNOSIS — E119 Type 2 diabetes mellitus without complications: Secondary | ICD-10-CM

## 2012-09-30 LAB — BASIC METABOLIC PANEL
CO2: 26 mEq/L (ref 19–32)
Calcium: 9.6 mg/dL (ref 8.4–10.5)
Creatinine, Ser: 0.8 mg/dL (ref 0.4–1.2)
Glucose, Bld: 128 mg/dL — ABNORMAL HIGH (ref 70–99)

## 2012-09-30 LAB — LIPID PANEL: Triglycerides: 208 mg/dL — ABNORMAL HIGH (ref 0.0–149.0)

## 2012-09-30 LAB — HEPATIC FUNCTION PANEL
ALT: 47 U/L — ABNORMAL HIGH (ref 0–35)
AST: 46 U/L — ABNORMAL HIGH (ref 0–37)
Bilirubin, Direct: 0.1 mg/dL (ref 0.0–0.3)
Total Bilirubin: 0.5 mg/dL (ref 0.3–1.2)
Total Protein: 8.3 g/dL (ref 6.0–8.3)

## 2012-09-30 MED ORDER — LORAZEPAM 1 MG PO TABS: 1.0000 mg | ORAL_TABLET | Freq: Two times a day (BID) | ORAL | Status: DC | PRN

## 2012-09-30 MED ORDER — TEMAZEPAM 15 MG PO CAPS: 15.0000 mg | ORAL_CAPSULE | Freq: Every evening | ORAL | Status: DC | PRN

## 2012-09-30 NOTE — Assessment & Plan Note (Signed)
Lost wt Continue with current prescription therapy as reflected on the Med list. Labs

## 2012-09-30 NOTE — Assessment & Plan Note (Signed)
Grief discussed. Declined councelling

## 2012-09-30 NOTE — Progress Notes (Signed)
   Subjective:     HPI  C/o grief - son and brother - both died The patient presents for a follow-up of  chronic hypertension, chronic dyslipidemia, type 2 diabetes controlled with medicines. C/o LBP. F/u L foot pain - chronic, better. F/u R foot cramps - driving to [place] a lot. C/o R groin pain w/walking.  She was not able to take Coreg - weak legs. C/o warts  BP Readings from Last 3 Encounters:  09/30/12 130/70  07/01/12 130/72  04/12/12 132/78   Wt Readings from Last 3 Encounters:  09/30/12 257 lb (116.574 kg)  07/01/12 262 lb (118.842 kg)  04/12/12 265 lb 8 oz (120.43 kg)       Review of Systems  Constitutional: Negative for chills, activity change, appetite change, fatigue and unexpected weight change.  HENT: Negative for congestion, mouth sores and sinus pressure.   Eyes: Negative for visual disturbance.  Respiratory: Negative for cough and chest tightness.   Cardiovascular: Negative for leg swelling.  Gastrointestinal: Negative for nausea and abdominal pain.  Genitourinary: Negative for frequency, difficulty urinating and vaginal pain.  Musculoskeletal: Positive for back pain, arthralgias and gait problem.  Skin: Negative for pallor and rash.  Neurological: Negative for dizziness, tremors, weakness, numbness and headaches.  Psychiatric/Behavioral: Negative for suicidal ideas, confusion and sleep disturbance. The patient is nervous/anxious.        Objective:   Physical Exam  Constitutional: She appears well-developed.  HENT:  Head: Normocephalic.  Right Ear: External ear normal.  Left Ear: External ear normal.  Nose: Nose normal.  Mouth/Throat: Oropharynx is clear and moist.  Eyes: Conjunctivae are normal. Pupils are equal, round, and reactive to light. Right eye exhibits no discharge. Left eye exhibits no discharge.  Neck: Normal range of motion. Neck supple. No JVD present. No tracheal deviation present. No thyromegaly present.  Cardiovascular: Normal rate,  regular rhythm and normal heart sounds.   Pulmonary/Chest: No stridor. No respiratory distress. She has no wheezes.  Abdominal: Soft. Bowel sounds are normal. She exhibits no distension and no mass. There is no tenderness. There is no rebound and no guarding.  Musculoskeletal: She exhibits tenderness (LS spine, L dors foot). She exhibits no edema.  Lymphadenopathy:    She has no cervical adenopathy.  Neurological: She displays normal reflexes. No cranial nerve deficit. She exhibits normal muscle tone. Coordination normal.  Skin: No rash noted. No erythema.  Psychiatric: She has a normal mood and affect. Her behavior is normal. Judgment and thought content normal.   tearful  Lab Results  Component Value Date   WBC 9.7 08/17/2011   HGB 15.2* 08/17/2011   HCT 44.5 08/17/2011   PLT 346 08/17/2011   GLUCOSE 237* 06/20/2012   CHOL 227* 12/20/2011   TRIG 273.0* 12/20/2011   HDL 38.90* 12/20/2011   LDLDIRECT 149.0 12/20/2011   ALT 39* 03/25/2012   AST 35 03/25/2012   NA 137 06/20/2012   K 4.4 06/20/2012   CL 101 06/20/2012   CREATININE 0.8 06/20/2012   BUN 17 06/20/2012   CO2 27 06/20/2012   TSH 0.87 12/20/2011   INR 3.3 08/26/2009   HGBA1C 7.5* 06/20/2012   MICROALBUR 4.5* 12/02/2009         Assessment & Plan:

## 2012-09-30 NOTE — Assessment & Plan Note (Signed)
Continue with current prescription therapy as reflected on the Med list.  

## 2012-09-30 NOTE — Assessment & Plan Note (Signed)
Grief discussed. Declined councelling 

## 2012-09-30 NOTE — Assessment & Plan Note (Signed)
Wt Readings from Last 3 Encounters:  09/30/12 257 lb (116.574 kg)  07/01/12 262 lb (118.842 kg)  04/12/12 265 lb 8 oz (120.43 kg)

## 2012-09-30 NOTE — Assessment & Plan Note (Signed)
BP Readings from Last 3 Encounters:  09/30/12 130/70  07/01/12 130/72  04/12/12 132/78

## 2012-09-30 NOTE — Assessment & Plan Note (Signed)
Sleep test 

## 2012-10-01 DIAGNOSIS — I441 Atrioventricular block, second degree: Secondary | ICD-10-CM

## 2012-10-01 HISTORY — DX: Atrioventricular block, second degree: I44.1

## 2012-10-09 ENCOUNTER — Ambulatory Visit (INDEPENDENT_AMBULATORY_CARE_PROVIDER_SITE_OTHER): Payer: Medicare Other | Admitting: Internal Medicine

## 2012-10-09 ENCOUNTER — Emergency Department (HOSPITAL_COMMUNITY): Payer: Medicare Other

## 2012-10-09 ENCOUNTER — Encounter (HOSPITAL_COMMUNITY): Payer: Self-pay | Admitting: Emergency Medicine

## 2012-10-09 ENCOUNTER — Encounter: Payer: Self-pay | Admitting: Internal Medicine

## 2012-10-09 ENCOUNTER — Inpatient Hospital Stay (HOSPITAL_COMMUNITY)
Admission: EM | Admit: 2012-10-09 | Discharge: 2012-10-11 | DRG: 243 | Disposition: A | Discharge status: Home / Self Care | Payer: Medicare Other | Attending: Cardiology | Admitting: Cardiology

## 2012-10-09 ENCOUNTER — Telehealth: Payer: Self-pay | Admitting: Internal Medicine

## 2012-10-09 VITALS — BP 148/66 | HR 38 | Temp 97.3°F | Ht 62.5 in | Wt 240.0 lb

## 2012-10-09 DIAGNOSIS — F3289 Other specified depressive episodes: Secondary | ICD-10-CM | POA: Diagnosis present

## 2012-10-09 DIAGNOSIS — Z7982 Long term (current) use of aspirin: Secondary | ICD-10-CM

## 2012-10-09 DIAGNOSIS — G609 Hereditary and idiopathic neuropathy, unspecified: Secondary | ICD-10-CM | POA: Diagnosis present

## 2012-10-09 DIAGNOSIS — M1990 Unspecified osteoarthritis, unspecified site: Secondary | ICD-10-CM | POA: Diagnosis present

## 2012-10-09 DIAGNOSIS — E039 Hypothyroidism, unspecified: Secondary | ICD-10-CM | POA: Diagnosis present

## 2012-10-09 DIAGNOSIS — I498 Other specified cardiac arrhythmias: Secondary | ICD-10-CM

## 2012-10-09 DIAGNOSIS — K219 Gastro-esophageal reflux disease without esophagitis: Secondary | ICD-10-CM | POA: Diagnosis present

## 2012-10-09 DIAGNOSIS — R5381 Other malaise: Secondary | ICD-10-CM

## 2012-10-09 DIAGNOSIS — F329 Major depressive disorder, single episode, unspecified: Secondary | ICD-10-CM | POA: Diagnosis present

## 2012-10-09 DIAGNOSIS — R001 Bradycardia, unspecified: Secondary | ICD-10-CM

## 2012-10-09 DIAGNOSIS — F411 Generalized anxiety disorder: Secondary | ICD-10-CM | POA: Diagnosis present

## 2012-10-09 DIAGNOSIS — R42 Dizziness and giddiness: Secondary | ICD-10-CM

## 2012-10-09 DIAGNOSIS — E782 Mixed hyperlipidemia: Secondary | ICD-10-CM | POA: Diagnosis present

## 2012-10-09 DIAGNOSIS — E119 Type 2 diabetes mellitus without complications: Secondary | ICD-10-CM | POA: Diagnosis present

## 2012-10-09 DIAGNOSIS — Z86711 Personal history of pulmonary embolism: Secondary | ICD-10-CM

## 2012-10-09 DIAGNOSIS — I441 Atrioventricular block, second degree: Secondary | ICD-10-CM | POA: Diagnosis present

## 2012-10-09 DIAGNOSIS — I10 Essential (primary) hypertension: Secondary | ICD-10-CM | POA: Diagnosis present

## 2012-10-09 DIAGNOSIS — Z8249 Family history of ischemic heart disease and other diseases of the circulatory system: Secondary | ICD-10-CM

## 2012-10-09 DIAGNOSIS — Z86718 Personal history of other venous thrombosis and embolism: Secondary | ICD-10-CM

## 2012-10-09 DIAGNOSIS — Z9089 Acquired absence of other organs: Secondary | ICD-10-CM

## 2012-10-09 DIAGNOSIS — Z823 Family history of stroke: Secondary | ICD-10-CM

## 2012-10-09 DIAGNOSIS — Z79899 Other long term (current) drug therapy: Secondary | ICD-10-CM

## 2012-10-09 DIAGNOSIS — Z6841 Body Mass Index (BMI) 40.0 and over, adult: Secondary | ICD-10-CM

## 2012-10-09 DIAGNOSIS — E669 Obesity, unspecified: Secondary | ICD-10-CM | POA: Diagnosis present

## 2012-10-09 DIAGNOSIS — I442 Atrioventricular block, complete: Principal | ICD-10-CM | POA: Diagnosis present

## 2012-10-09 HISTORY — DX: Atrioventricular block, second degree: I44.1

## 2012-10-09 LAB — BASIC METABOLIC PANEL
CO2: 26 mEq/L (ref 19–32)
Calcium: 9.6 mg/dL (ref 8.4–10.5)
Chloride: 102 mEq/L (ref 96–112)
Glucose, Bld: 122 mg/dL — ABNORMAL HIGH (ref 70–99)
Sodium: 139 mEq/L (ref 135–145)

## 2012-10-09 LAB — CBC WITH DIFFERENTIAL/PLATELET
Basophils Absolute: 0.1 10*3/uL (ref 0.0–0.1)
Eosinophils Relative: 3 % (ref 0–5)
HCT: 39 % (ref 36.0–46.0)
Lymphocytes Relative: 34 % (ref 12–46)
Lymphs Abs: 2.7 10*3/uL (ref 0.7–4.0)
MCV: 91.1 fL (ref 78.0–100.0)
Monocytes Absolute: 0.5 10*3/uL (ref 0.1–1.0)
Neutro Abs: 4.5 10*3/uL (ref 1.7–7.7)
Platelets: 278 10*3/uL (ref 150–400)
RBC: 4.28 MIL/uL (ref 3.87–5.11)
RDW: 14.2 % (ref 11.5–15.5)
WBC: 8 10*3/uL (ref 4.0–10.5)

## 2012-10-09 LAB — POCT I-STAT TROPONIN I: Troponin i, poc: 0.02 ng/mL (ref 0.00–0.08)

## 2012-10-09 MED ORDER — HEPARIN SODIUM (PORCINE) 5000 UNIT/ML IJ SOLN
5000.0000 [IU] | Freq: Three times a day (TID) | INTRAMUSCULAR | Status: DC
  Administered 2012-10-09 – 2012-10-10 (×2): 5000 [IU] via SUBCUTANEOUS
  Filled 2012-10-09 (×5): qty 1

## 2012-10-09 MED ORDER — ALLOPURINOL 100 MG PO TABS
100.0000 mg | ORAL_TABLET | Freq: Every day | ORAL | Status: DC
  Administered 2012-10-10 – 2012-10-11 (×2): 100 mg via ORAL
  Filled 2012-10-09 (×2): qty 1

## 2012-10-09 MED ORDER — LEVOTHYROXINE SODIUM 75 MCG PO TABS
75.0000 ug | ORAL_TABLET | Freq: Every day | ORAL | Status: DC
  Administered 2012-10-10 – 2012-10-11 (×2): 75 ug via ORAL
  Filled 2012-10-09 (×3): qty 1

## 2012-10-09 MED ORDER — INSULIN ASPART 100 UNIT/ML ~~LOC~~ SOLN
0.0000 [IU] | Freq: Three times a day (TID) | SUBCUTANEOUS | Status: DC
  Administered 2012-10-11: 10:00:00 3 [IU] via SUBCUTANEOUS

## 2012-10-09 MED ORDER — HYDROCODONE-ACETAMINOPHEN 7.5-325 MG PO TABS
1.0000 | ORAL_TABLET | Freq: Three times a day (TID) | ORAL | Status: DC | PRN
  Administered 2012-10-10 (×2): 1 via ORAL
  Filled 2012-10-09 (×2): qty 1

## 2012-10-09 MED ORDER — CHOLECALCIFEROL 25 MCG (1000 UT) PO TABS: 1000.0000 [IU] | ORAL_TABLET | Freq: Every day | ORAL | Status: DC

## 2012-10-09 MED ORDER — ACETAMINOPHEN 325 MG PO TABS: 650.0000 mg | ORAL_TABLET | ORAL | Status: DC | PRN

## 2012-10-09 MED ORDER — ONDANSETRON HCL 4 MG/2ML IJ SOLN: 4.0000 mg | Freq: Four times a day (QID) | INTRAMUSCULAR | Status: DC | PRN

## 2012-10-09 MED ORDER — VENLAFAXINE HCL ER 75 MG PO CP24
75.0000 mg | ORAL_CAPSULE | Freq: Every day | ORAL | Status: DC
  Administered 2012-10-10 – 2012-10-11 (×2): 75 mg via ORAL
  Filled 2012-10-09 (×3): qty 1

## 2012-10-09 MED ORDER — LOSARTAN POTASSIUM 50 MG PO TABS
100.0000 mg | ORAL_TABLET | Freq: Every day | ORAL | Status: DC
Start: 2012-10-10 — End: 2012-10-11
  Administered 2012-10-10 – 2012-10-11 (×2): 100 mg via ORAL
  Filled 2012-10-09 (×2): qty 2

## 2012-10-09 MED ORDER — PANTOPRAZOLE SODIUM 40 MG PO TBEC
40.0000 mg | DELAYED_RELEASE_TABLET | Freq: Every day | ORAL | Status: DC
  Administered 2012-10-09 – 2012-10-11 (×3): 40 mg via ORAL
  Filled 2012-10-09 (×3): qty 1

## 2012-10-09 MED ORDER — SODIUM CHLORIDE 0.9 % IJ SOLN: 3.0000 mL | INTRAMUSCULAR | Status: DC | PRN

## 2012-10-09 MED ORDER — VITAMIN D3 25 MCG (1000 UNIT) PO TABS
1000.0000 [IU] | ORAL_TABLET | Freq: Every day | ORAL | Status: DC
  Administered 2012-10-09 – 2012-10-11 (×3): 1000 [IU] via ORAL
  Filled 2012-10-09 (×3): qty 1

## 2012-10-09 MED ORDER — TEMAZEPAM 15 MG PO CAPS
15.0000 mg | ORAL_CAPSULE | Freq: Every evening | ORAL | Status: DC | PRN
  Administered 2012-10-09 – 2012-10-10 (×2): 15 mg via ORAL
  Filled 2012-10-09 (×2): qty 1

## 2012-10-09 MED ORDER — SODIUM CHLORIDE 0.9 % IJ SOLN
3.0000 mL | Freq: Two times a day (BID) | INTRAMUSCULAR | Status: DC
  Administered 2012-10-09: 3 mL via INTRAVENOUS

## 2012-10-09 MED ORDER — NITROGLYCERIN 0.4 MG SL SUBL: 0.4000 mg | SUBLINGUAL_TABLET | SUBLINGUAL | Status: DC | PRN

## 2012-10-09 MED ORDER — ASPIRIN EC 81 MG PO TBEC
81.0000 mg | DELAYED_RELEASE_TABLET | Freq: Every day | ORAL | Status: DC
  Administered 2012-10-10 – 2012-10-11 (×2): 81 mg via ORAL
  Filled 2012-10-09 (×2): qty 1

## 2012-10-09 MED ORDER — LORAZEPAM 0.5 MG PO TABS: 1.0000 mg | ORAL_TABLET | Freq: Two times a day (BID) | ORAL | Status: DC | PRN

## 2012-10-09 MED ORDER — NABUMETONE 500 MG PO TABS
500.0000 mg | ORAL_TABLET | Freq: Two times a day (BID) | ORAL | Status: DC | PRN
  Filled 2012-10-09: qty 1

## 2012-10-09 MED ORDER — SODIUM CHLORIDE 0.9 % IV SOLN: 250.0000 mL | INTRAVENOUS | Status: DC | PRN

## 2012-10-09 MED ORDER — ATROPINE SULFATE 1 MG/ML IJ SOLN
0.5000 mg | Freq: Once | INTRAMUSCULAR | Status: AC
  Administered 2012-10-09: 0.5 mg via INTRAVENOUS
  Filled 2012-10-09: qty 1
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
## 2012-10-09 NOTE — Patient Instructions (Signed)
Bradycardia Bradycardia is a term for a heart rate (pulse) that, in adults, is slower than 60 beats per minute. A normal rate is 60 to 100 beats per minute. A heart rate below 60 beats per minute may be normal for some adults with healthy hearts. If the rate is too slow, the heart may have trouble pumping the volume of blood the body needs. If the heart rate gets too low, blood flow to the brain may be decreased and may make you feel lightheaded, dizzy, or faint. The heart has a natural pacemaker in the top of the heart called the SA node (sinoatrial or sinus node). This pacemaker sends out regular electrical signals to the muscle of the heart, telling the heart muscle when to beat (contract). The electrical signal travels from the upper parts of the heart (atria) through the AV node (atrioventricular node), to the lower chambers of the heart (ventricles). The ventricles squeeze, pumping the blood from your heart to your lungs and to the rest of your body. CAUSES   Problem with the heart's electrical system.  Problem with the heart's natural pacemaker.  Heart disease, damage, or infection.  Medications.  Problems with minerals and salts (electrolytes). SYMPTOMS   Fainting (syncope).  Fatigue and weakness.  Shortness of breath (dyspnea).  Chest pain (angina).  Drowsiness.  Confusion. DIAGNOSIS   An electrocardiogram (ECG) can help your caregiver determine the type of slow heart rate you have.  If the cause is not seen on an ECG, you may need to wear a heart monitor that records your heart rhythm for several hours or days.  Blood tests. TREATMENT   Electrolyte supplements.  Medications.  Withholding medication which is causing a slow heart rate.  Pacemaker placement. SEEK IMMEDIATE MEDICAL CARE IF:   You feel lightheaded or faint.  You develop an irregular heart rate.  You feel chest pain or have trouble breathing. MAKE SURE YOU:   Understand these  instructions.  Will watch your condition.  Will get help right away if you are not doing well or get worse. Document Released: 12/10/2001 Document Revised: 06/12/2011 Document Reviewed: 11/06/2007 [employer] Patient Information 2014 [employer].  

## 2012-10-09 NOTE — ED Notes (Signed)
Pt from [facility], c/o dizziness check cbg,bp and hr at home initial was 43. Evaluated at [facility], pt HR 43 to 36. Pt denies cp, sob. Is dizzy when standing
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
## 2012-10-09 NOTE — Telephone Encounter (Signed)
Patient Information:  Caller Name: [facility]  Phone: [phone]  Patient: Jessica [name]  Gender: Female  DOB: 1934/02/06  Age: 79 Years  PCP: Plotnikov, Alex (Adults only)  Office Follow Up:  Does the office need to follow up with this patient?: No  Instructions For The Office: N/A  RN Note:  Pt was very weak while running errands, checked Pulse upon returning home, P43 and 39.  Had Pt recheck BP at time of call, BP 119/59 P51 at 1200 on 7-9. Finger Stick was 127.  Pt took both scheduled HTN meds, Losartan and Maxzide (w/ HCTZ) at breakfast.  Pt has OV on 8-11.  Scheduled at appt w/ NP at 1545 on 7-9, no same day appt w/ PCP.   Symptoms  Reason For Call & Symptoms: Slow Heart Rate  Reviewed Health History In EMR: Yes  Reviewed Medications In EMR: Yes  Reviewed Allergies In EMR: Yes  Reviewed Surgeries / Procedures: Yes  Date of Onset of Symptoms: 10/09/2012  Guideline(s) Used:  Heart Rate and Heartbeat Questions  Disposition Per Guideline:   See Today in Office  Reason For Disposition Reached:   Age > 60 years  Advice Given:  N/A  Patient Will Follow Care Advice:  YES  Appointment Scheduled:  10/09/2012 15:45:00 Appointment Scheduled Provider:  Nicki Reaper
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
## 2012-10-09 NOTE — ED Notes (Signed)
Pt states she was walking her dog this morning and began to sweat profusely, pt states she did not feel dizzy at first she just began sweating. Pt states she later took her blood pressure and checked her sugar, her heart rate was in the 40's. Pt states she took it again a little bit later due to becoming weak and dizzy around 11am after running errands and her HR was around 30's-40's so she went to her pcp at [facility]. Pt states she only gets dizzy when she is walking but not when she lies still.

## 2012-10-10 ENCOUNTER — Encounter (HOSPITAL_COMMUNITY): Payer: Self-pay | Admitting: *Deleted

## 2012-10-10 ENCOUNTER — Encounter (HOSPITAL_COMMUNITY)
Admission: EM | Disposition: A | Discharge status: Home / Self Care | Payer: Self-pay | Source: Home / Self Care | Attending: Cardiology

## 2012-10-10 DIAGNOSIS — I441 Atrioventricular block, second degree: Secondary | ICD-10-CM

## 2012-10-10 DIAGNOSIS — I359 Nonrheumatic aortic valve disorder, unspecified: Secondary | ICD-10-CM

## 2012-10-10 HISTORY — PX: PACEMAKER INSERTION: SHX728

## 2012-10-10 HISTORY — PX: PERMANENT PACEMAKER INSERTION: SHX5480

## 2012-10-10 LAB — GLUCOSE, CAPILLARY
Glucose-Capillary: 144 mg/dL — ABNORMAL HIGH (ref 70–99)
Glucose-Capillary: 116 mg/dL — ABNORMAL HIGH (ref 70–99)

## 2012-10-10 LAB — COMPREHENSIVE METABOLIC PANEL
ALT: 33 U/L (ref 0–35)
AST: 35 U/L (ref 0–37)
Albumin: 3.4 g/dL — ABNORMAL LOW (ref 3.5–5.2)
Chloride: 104 mEq/L (ref 96–112)
Creatinine, Ser: 0.72 mg/dL (ref 0.50–1.10)
Sodium: 140 mEq/L (ref 135–145)
Total Bilirubin: 0.3 mg/dL (ref 0.3–1.2)

## 2012-10-10 LAB — BASIC METABOLIC PANEL
Chloride: 103 mEq/L (ref 96–112)
GFR calc Af Amer: >90 mL/min (ref >90)
Potassium: 3.9 mEq/L (ref 3.5–5.1)

## 2012-10-10 LAB — TSH: TSH: 3.867 u[IU]/mL (ref 0.350–4.500)

## 2012-10-10 LAB — LIPID PANEL
HDL: 39 mg/dL — ABNORMAL LOW (ref >39)
LDL Cholesterol: 113 mg/dL — ABNORMAL HIGH (ref 0–99)
Total CHOL/HDL Ratio: 5.4 RATIO
Triglycerides: 289 mg/dL — ABNORMAL HIGH (ref <150)
VLDL: 58 mg/dL — ABNORMAL HIGH (ref 0–40)

## 2012-10-10 LAB — CBC
HCT: 38.6 % (ref 36.0–46.0)
Hemoglobin: 12.8 g/dL (ref 12.0–15.0)
WBC: 7.7 10*3/uL (ref 4.0–10.5)

## 2012-10-10 LAB — HEMOGLOBIN A1C
Hgb A1c MFr Bld: 6.4 % — ABNORMAL HIGH (ref <5.7)
Mean Plasma Glucose: 137 mg/dL — ABNORMAL HIGH (ref <117)

## 2012-10-10 LAB — TROPONIN I: Troponin I: <0.3 ng/mL (ref <0.30)

## 2012-10-10 SURGERY — PERMANENT PACEMAKER INSERTION
Anesthesia: LOCAL

## 2012-10-10 MED ORDER — CEFAZOLIN SODIUM-DEXTROSE 2-3 GM-% IV SOLR
2.0000 g | INTRAVENOUS | Status: DC
  Filled 2012-10-10: qty 50

## 2012-10-10 MED ORDER — LIDOCAINE HCL (PF) 1 % IJ SOLN
INTRAMUSCULAR | Status: AC
  Filled 2012-10-10: qty 60

## 2012-10-10 MED ORDER — ACETAMINOPHEN 325 MG PO TABS: 325.0000 mg | ORAL_TABLET | ORAL | Status: DC | PRN

## 2012-10-10 MED ORDER — SODIUM CHLORIDE 0.45 % IV SOLN
INTRAVENOUS | Status: DC
  Administered 2012-10-10: 11:00:00 via INTRAVENOUS

## 2012-10-10 MED ORDER — ONDANSETRON HCL 4 MG/2ML IJ SOLN: 4.0000 mg | Freq: Four times a day (QID) | INTRAMUSCULAR | Status: DC | PRN

## 2012-10-10 MED ORDER — SODIUM CHLORIDE 0.9 % IR SOLN
80.0000 mg | Status: DC
  Filled 2012-10-10 (×2): qty 2

## 2012-10-10 MED ORDER — FENTANYL CITRATE 0.05 MG/ML IJ SOLN
INTRAMUSCULAR | Status: AC
  Filled 2012-10-10: qty 2

## 2012-10-10 MED ORDER — CHLORHEXIDINE GLUCONATE 4 % EX LIQD
60.0000 mL | Freq: Once | CUTANEOUS | Status: AC
  Administered 2012-10-10: 4 via TOPICAL
  Filled 2012-10-10: qty 60

## 2012-10-10 MED ORDER — LIDOCAINE HCL (PF) 1 % IJ SOLN
INTRAMUSCULAR | Status: AC
  Filled 2012-10-10: qty 30

## 2012-10-10 MED ORDER — MIDAZOLAM HCL 5 MG/5ML IJ SOLN
INTRAMUSCULAR | Status: AC
  Filled 2012-10-10: qty 5

## 2012-10-10 MED ORDER — SODIUM CHLORIDE 0.9 % IV SOLN
INTRAVENOUS | Status: DC
  Administered 2012-10-10: 11:00:00 via INTRAVENOUS

## 2012-10-10 MED ORDER — CEFAZOLIN SODIUM 1-5 GM-% IV SOLN
1.0000 g | Freq: Four times a day (QID) | INTRAVENOUS | Status: AC
  Administered 2012-10-10 – 2012-10-11 (×3): 1 g via INTRAVENOUS
  Filled 2012-10-10 (×3): qty 50

## 2012-10-10 NOTE — Op Note (Signed)
SURGEON:  Hillis Range, MD     PREPROCEDURE DIAGNOSIS:  Symptomatic second degree AV block    POSTPROCEDURE DIAGNOSIS:  Symptomatic second degree AV block     PROCEDURES:   1. Left upper extremity venography.   2. Pacemaker implantation.     INTRODUCTION: Jessica [name] is a 79 y.o. female  with a history of symptomatic mobitz II second degree AV block who presents today for pacemaker implantation.  The patient reports intermittent episodes of dizziness over the past few days.  No reversible causes have been identified.  The patient therefore presents today for pacemaker implantation.     DESCRIPTION OF PROCEDURE:  Informed written consent was obtained, and the patient was brought to the electrophysiology lab in a fasting state.  The patient required no sedation for the procedure today.  The patients left chest was prepped and draped in the usual sterile fashion by the EP lab staff. The skin overlying the left deltopectoral region was infiltrated with lidocaine for local analgesia.  A 4-cm incision was made over the left deltopectoral region.  A left subcutaneous pacemaker pocket was fashioned using a combination of sharp and blunt dissection. Electrocautery was required to assure hemostasis.    Left Upper Extremity Venography: A venogram of the left upper extremity was performed, which revealed a large left cephalic vein, which emptied into a large left subclavian vein.  The left axillary vein was moderate in size.    RA/RV Lead Placement: The left axillary vein was therefore cannulated.  Through the left axillary vein, a Medtronic model [id number] (serial number [id number] ) right atrial lead and a Medtronic model 5092- 58 (serial number [id number]) right ventricular lead were advanced with fluoroscopic visualization into the right atrial appendage and right ventricular apex positions respectively.  Initial atrial lead P- waves measured 2.2 mV with impedance of 505 ohms and a threshold of 1.0 V  at 0.5 msec.  Right ventricular lead R-waves measured 7 mV with an impedance of 602 ohms and a threshold of 0.5 V at 0.5 msec.  Both leads were secured to the pectoralis fascia using #2-0 silk over the suture sleeves.   Device Placement:  The leads were then connected to a Medtronic Adapta L model ADDRL 1 (serial number [id number]) pacemaker.  The pocket was irrigated with copious gentamicin solution.  The pacemaker was then placed into the pocket.  The pocket was then closed in 2 layers with 2.0 Vicryl suture for the subcutaneous and subcuticular layers.  Steri-  Strips and a sterile dressing were then applied.  There were no early apparent complications.     CONCLUSIONS:   1. Successful implantation of a Medtronic Adapta L dual-chamber pacemaker for symptomatic mobitz II second degree AV block  2. No early apparent complications.           Hillis Range, MD 10/10/2012 12:19 PM
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
## 2012-10-10 NOTE — Interval H&P Note (Signed)
History and Physical Interval Note:  10/10/2012 10:16 AM  Jessica [name]  has presented today for surgery, with the diagnosis of Heart block  The various methods of treatment have been discussed with the patient and family. After consideration of risks, benefits and other options for treatment, the patient has consented to  Procedure(s): PERMANENT PACEMAKER INSERTION (N/A) as a surgical intervention .  The patient's history has been reviewed, patient examined, no change in status, stable for surgery.  I have reviewed the patient's chart and labs.  Questions were answered to the patient's satisfaction.     Hillis Range

## 2012-10-10 NOTE — Care Management Note (Signed)
    Page 1 of 1   10/10/2012     10:30:17 AM   CARE MANAGEMENT NOTE 10/10/2012  Patient:  Jessica [name], Jessica [name]   Account Number:  [id number]  Date Initiated:  10/10/2012  Documentation initiated by:  Junius Creamer  Subjective/Objective Assessment:   adm w heart block     Action/Plan:   lives alone, pcp dr Posey Rea   Anticipated DC Date:     Anticipated DC Plan:        DC Planning Services  CM consult      Choice offered to / List presented to:             Status of service:   Medicare Important Message given?   (If response is "NO", the following Medicare IM given date fields will be blank) Date Medicare IM given:   Date Additional Medicare IM given:    Discharge Disposition:    Per UR Regulation:  Reviewed for med. necessity/level of care/duration of stay  If discussed at Long Length of Stay Meetings, dates discussed:    Comments:
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
## 2012-10-10 NOTE — Progress Notes (Signed)
  Echocardiogram 2D Echocardiogram has been performed.  Arvil Chaco 10/10/2012, 8:18 AM

## 2012-10-11 ENCOUNTER — Inpatient Hospital Stay (HOSPITAL_COMMUNITY): Payer: Medicare Other

## 2012-10-11 LAB — BASIC METABOLIC PANEL
Calcium: 8.7 mg/dL (ref 8.4–10.5)
GFR calc non Af Amer: 83 mL/min — ABNORMAL LOW (ref >90)
Glucose, Bld: 135 mg/dL — ABNORMAL HIGH (ref 70–99)
Potassium: 3.6 mEq/L (ref 3.5–5.1)
Sodium: 138 mEq/L (ref 135–145)

## 2012-10-11 MED ORDER — LIVING WELL WITH DIABETES BOOK
Freq: Once | Status: AC
  Administered 2012-10-11: 02:00:00
  Filled 2012-10-11: qty 1

## 2012-10-11 MED ORDER — YOU HAVE A PACEMAKER BOOK
Freq: Once | Status: AC
  Administered 2012-10-11: 01:00:00
  Filled 2012-10-11: qty 1

## 2012-10-11 MED ORDER — ASPIRIN 81 MG PO TABS: 81.0000 mg | ORAL_TABLET | Freq: Every day | ORAL | Status: DC

## 2012-10-11 NOTE — Discharge Summary (Signed)
ELECTROPHYSIOLOGY DISCHARGE SUMMARY    Patient ID: Jessica [name],  MRN: [id number], DOB/AGE: 1933/11/17 79 y.o.  Admit date: 10/09/2012 Discharge date: 10/11/2012  Primary Care Physician: Sonda Primes, MD Primary Cardiologist: New to [facility]; Kattie Santoyo, MD  Primary Discharge Diagnosis:  1. Symptomatic 2:1 AV block with transient CHB s/p PPM implant  Secondary Discharge Diagnoses:  1. HTN 2. Dyslipidemia 3. Hypothyroidism 4. DM 5. Prior DVT/PE 6. GERD 7. Anxiety/depression 8. Osteoarthritis  Procedures This Admission:  1. 2D echo 10/10/2012 - report pending at the time of discharge 2. Dual chamber PPM implantation 10/10/2012  RA lead - Medtronic model [id number] (serial number [id number] ) right atrial lead  RV lead - Medtronic model 5092- 58 (serial number [id number]) right ventricular lead  Device - Medtronic Adapta L model ADDRL 1 (serial number [id number]) pacemaker  History and Hospital Course: Jessica [name] is a 79 y.o. female with HTN, dyslipidemia, hypothyroidism, DM and prior DVT/PE who was admitted with dizziness and bradycardia on 10/09/2012. She was found to have 2:1 AV block with transient CHB. She was admitted to the CCU with persistent AV block. She was not taking any AV nodal blocking medications and no reversible causes were identified. Therefore, she underwent dual chamber PPM implantation yesterday. Ms. Crossman tolerated this procedure well without any immediate complication. She remains hemodynamically stable and afebrile. Her chest xray shows stable lead placement without pneumothorax. Her device interrogation shows normal PPM function with stable lead parameters/measurements. Her implant site is intact without significant bleeding or hematoma. She has been given discharge instructions including wound care and activity restrictions. She will follow-up in 10 days for wound check. There were no changes made to her medications. She has been seen, examined and  deemed stable for discharge today by Dr. Hillis Range.  Discharge Vitals: Blood pressure 151/61, pulse 66, temperature 97.9 F (36.6 C), temperature source Oral, resp. rate 20, height 5\' 3"  (1.6 m), weight 260 lb 5.8 oz (118.1 kg), SpO2 93.00%.   Labs: Lab Results  Component Value Date   WBC 7.7 10/09/2012   HGB 12.8 10/09/2012   HCT 38.6 10/09/2012   MCV 90.6 10/09/2012   PLT 271 10/09/2012    Recent Labs Lab 10/09/12 2325  10/11/12 0525  NA 140  < > 138  K 3.6  < > 3.6  CL 104  < > 104  CO2 25  < > 24  BUN 18  < > 15  CREATININE 0.72  < > 0.66  CALCIUM 9.5  < > 8.7  PROT 7.3  --   --   BILITOT 0.3  --   --   ALKPHOS 76  --   --   ALT 33  --   --   AST 35  --   --   GLUCOSE 157*  < > 135*  < > = values in this interval not displayed. Lab Results  Component Value Date   CKTOTAL 36 10/06/2008   TROPONINI <0.30 10/10/2012    Lab Results  Component Value Date   CHOL 210* 10/10/2012   CHOL 220* 09/30/2012   CHOL 227* 12/20/2011   Lab Results  Component Value Date   HDL 39* 10/10/2012   HDL 47.80 09/30/2012   HDL 52.84* 12/20/2011   Lab Results  Component Value Date   LDLCALC 113* 10/10/2012   Lab Results  Component Value Date   TRIG 289* 10/10/2012   TRIG 208.0* 09/30/2012   TRIG 273.0* 12/20/2011   Lab Results  Component Value Date   CHOLHDL 5.4 10/10/2012   CHOLHDL 5 09/30/2012   CHOLHDL 6 12/20/2011   Lab Results  Component Value Date   LDLDIRECT 155.9 09/30/2012   LDLDIRECT 149.0 12/20/2011   LDLDIRECT 159.1 08/26/2010    Disposition:  The patient is being discharged in stable condition.  Follow-up:     Follow-up Information   Follow up with [facility] Device 1 On 10/21/2012. (At 3:00 PM for wound check)    Contact information:   [address] [place] [address] [phone]      Follow up with Hillis Range, MD In 3 months. (Our office will schedule)    Contact information:   [address] [place] [address] [phone]      Discharge Medications:    Medication List         allopurinol 100 MG tablet  Commonly known as:  ZYLOPRIM  Take 1 tablet (100 mg total) by mouth daily. For gout prevention     aspirin 81 MG tablet  Take 1 tablet (81 mg total) by mouth daily.     b complex vitamins tablet  Take 1 tablet by mouth daily.     Cholecalciferol 1000 UNITS tablet  Take 1,000 Units by mouth daily.     CINNAMON PO  Take 1 capsule by mouth daily.     COD LIVER OIL PO  Take 2 capsules by mouth daily.     furosemide 20 MG tablet  Commonly known as:  LASIX  Take 1 tablet (20 mg total) by mouth daily as needed. For swelling     HYDROcodone-acetaminophen 7.5-325 MG per tablet  Commonly known as:  NORCO  Take 1 tablet by mouth every 8 (eight) hours as needed for pain.     lansoprazole 30 MG capsule  Commonly known as:  PREVACID  Take 30 mg by mouth daily as needed (acid reflux).     levothyroxine 75 MCG tablet  Commonly known as:  SYNTHROID, LEVOTHROID  Take 1 tablet (75 mcg total) by mouth daily.     LORazepam 0.5 MG tablet  Commonly known as:  ATIVAN  Take 0.5 mg by mouth 2 (two) times daily as needed for anxiety.     losartan 100 MG tablet  Commonly known as:  COZAAR  Take 1 tablet (100 mg total) by mouth daily.     metFORMIN 1000 MG tablet  Commonly known as:  GLUCOPHAGE  Take 1 tablet (1,000 mg total) by mouth 2 (two) times daily with a meal.     nabumetone 500 MG tablet  Commonly known as:  RELAFEN  Take 1 tablet (500 mg total) by mouth 2 (two) times daily. Prn pain     OVER THE COUNTER MEDICATION  Take 2 tablets by mouth daily. OTC stool softener     temazepam 15 MG capsule  Commonly known as:  RESTORIL  Take 15 mg by mouth at bedtime as needed for sleep.     triamterene-hydrochlorothiazide 37.5-25 MG per tablet  Commonly known as:  MAXZIDE-25  Take 1 each (1 tablet total) by mouth daily.       Duration of Discharge Encounter: Greater than 30 minutes including physician  time.  Limmie Patricia, PA-C 10/11/2012, 9:16 AM   Hillis Range, MD

## 2012-10-11 NOTE — Progress Notes (Addendum)
Patient: Jessica [name] Date of Encounter: 10/11/2012, 7:35 AM Admit date: 10/09/2012     Subjective  Jessica [name] has no complaints this AM. She denies CP or SOB.   Objective  Physical Exam: Vitals: BP 151/61  Pulse 66  Temp(Src) 97.9 F (36.6 C) (Oral)  Resp 20  Ht 5\' 3"  (1.6 m)  Wt 260 lb 5.8 oz (118.1 kg)  BMI 46.13 kg/m2  SpO2 93% General: Well developed, well appearing 79 year old female in no acute distress. Neck: Supple. JVD not elevated. Lungs: Clear bilaterally to auscultation without wheezes, rales, or rhonchi. Breathing is unlabored. Heart: RRR S1 S2 without murmurs, rubs, or gallops.  Abdomen: Soft, non-distended. Extremities: No clubbing or cyanosis. No edema.  Distal pedal pulses are 2+ and equal bilaterally. Neuro: Alert and oriented X 3. Moves all extremities spontaneously. No focal deficits. Skin: Left upper chest/implant site intact without bleeding or hematoma.  Intake/Output:  Intake/Output Summary (Last 24 hours) at 10/11/12 0735 Last data filed at 10/11/12 0157  Gross per 24 hour  Intake    420 ml  Output    900 ml  Net   -480 ml    Inpatient Medications:  . allopurinol  100 mg Oral Daily  . aspirin EC  81 mg Oral Daily  . cholecalciferol  1,000 Units Oral Daily  . insulin aspart  0-15 Units Subcutaneous TID WC  . levothyroxine  75 mcg Oral QAC breakfast  . losartan  100 mg Oral Daily  . pantoprazole  40 mg Oral Daily  . venlafaxine XR  75 mg Oral QAC breakfast    Labs:  Recent Labs  10/09/12 1723 10/09/12 2325 10/10/12 0430 10/11/12 0525  NA 139 140 142 138  K 3.9 3.6 3.9 3.6  CL 102 104 103 104  CO2 26 25 23 24   GLUCOSE 122* 157* 144* 135*  BUN 21 18 17 15   CREATININE 0.76 0.72 0.67 0.66  CALCIUM 9.6 9.5 9.6 8.7  MG  --  2.1  --   --     Recent Labs  10/09/12 2325  AST 35  ALT 33  ALKPHOS 76  BILITOT 0.3  PROT 7.3  ALBUMIN 3.4*    Recent Labs  10/09/12 1723 10/09/12 2325  WBC 8.0 7.7  NEUTROABS 4.5  --    HGB 13.0 12.8  HCT 39.0 38.6  MCV 91.1 90.6  PLT 278 271    Recent Labs  10/09/12 2325 10/10/12 0430  TROPONINI <0.30 <0.30    Recent Labs  10/09/12 2325  HGBA1C 6.4*    Recent Labs  10/10/12 0430  CHOL 210*  HDL 39*  LDLCALC 113*  TRIG 289*  CHOLHDL 5.4    Recent Labs  10/09/12 2325  TSH 3.867    Radiology/Studies: CXR: pending Device interrogation: performed by industry shows normal PPM function with stable lead measurements    Assessment and Plan  1. Symptomatic second degree AV block s/p PPM implant  Ms. Crissman is doing well post PPM implant yesterday. Her wound is intact without bleeding or hematoma. CXR pending. Device interrogation shows normal PPM function. If her CXR shows stable lead placement without PTX, will plan for DC later this AM.   Signed, EDMISTEN, BROOKE PA-C  I have seen, examined the patient, and reviewed the above assessment and plan.  Changes to above are made where necessary.  Device interrogation reviewed today and normal.  Normal pacemaker function.  Co Sign: Hillis Range, MD 10/11/2012 12:44 PM

## 2012-10-21 ENCOUNTER — Ambulatory Visit (INDEPENDENT_AMBULATORY_CARE_PROVIDER_SITE_OTHER): Payer: Medicare Other | Admitting: *Deleted

## 2012-10-21 ENCOUNTER — Encounter: Payer: Self-pay | Admitting: Internal Medicine

## 2012-10-21 DIAGNOSIS — Z950 Presence of cardiac pacemaker: Secondary | ICD-10-CM

## 2012-10-21 DIAGNOSIS — I442 Atrioventricular block, complete: Secondary | ICD-10-CM

## 2012-10-21 NOTE — Progress Notes (Signed)
Pt seen in device clinic for follow up of recently implanted pacemaker.  Wound well healed.  No redness, swelling, or edema.  Steri-strips removed today.   Device interrogated and found to be functioning normally.  No changes made today. See PaceArt for full details.  Pt to follow up with Dr. Johney Frame in 3 months.   Farren Landa 10/21/2012 3:50 PM

## 2012-10-23 LAB — PACEMAKER DEVICE OBSERVATION
AL AMPLITUDE: 2 mv
AL IMPEDENCE PM: 489 Ohm
BAMS-0001: 150 {beats}/min
BATTERY VOLTAGE: 2.8 V
VENTRICULAR PACING PM: 99.8

## 2012-11-06 ENCOUNTER — Ambulatory Visit (INDEPENDENT_AMBULATORY_CARE_PROVIDER_SITE_OTHER): Payer: Medicare Other | Admitting: *Deleted

## 2012-11-06 ENCOUNTER — Encounter: Payer: Self-pay | Admitting: Internal Medicine

## 2012-11-06 DIAGNOSIS — Z950 Presence of cardiac pacemaker: Secondary | ICD-10-CM

## 2012-11-06 LAB — PACEMAKER DEVICE OBSERVATION: ATRIAL PACING PM: 6.3

## 2012-11-06 NOTE — Progress Notes (Signed)
Called pt in to print mode switch episodes, currently mode switching 6.3% of time since 10/21/12 visit---Pt is having PACs w/o symptoms.  Dr. Johney Frame discussed potential of B-blocker but pt decided to wait til 01/30/13 appt to make decision. Estella Husk

## 2012-11-11 ENCOUNTER — Ambulatory Visit (INDEPENDENT_AMBULATORY_CARE_PROVIDER_SITE_OTHER): Payer: Medicare Other | Admitting: Internal Medicine

## 2012-11-11 ENCOUNTER — Encounter: Payer: Self-pay | Admitting: Internal Medicine

## 2012-11-11 VITALS — BP 130/90 | HR 88 | Temp 97.2°F | Resp 16 | Wt 257.0 lb

## 2012-11-11 DIAGNOSIS — K219 Gastro-esophageal reflux disease without esophagitis: Secondary | ICD-10-CM

## 2012-11-11 DIAGNOSIS — E039 Hypothyroidism, unspecified: Secondary | ICD-10-CM

## 2012-11-11 DIAGNOSIS — Z8679 Personal history of other diseases of the circulatory system: Secondary | ICD-10-CM

## 2012-11-11 DIAGNOSIS — E119 Type 2 diabetes mellitus without complications: Secondary | ICD-10-CM

## 2012-11-11 DIAGNOSIS — N259 Disorder resulting from impaired renal tubular function, unspecified: Secondary | ICD-10-CM

## 2012-11-11 DIAGNOSIS — Z950 Presence of cardiac pacemaker: Secondary | ICD-10-CM | POA: Insufficient documentation

## 2012-11-11 DIAGNOSIS — F411 Generalized anxiety disorder: Secondary | ICD-10-CM

## 2012-11-11 MED ORDER — NABUMETONE 500 MG PO TABS: 500.0000 mg | ORAL_TABLET | Freq: Two times a day (BID) | ORAL | Status: DC | PRN

## 2012-11-11 MED ORDER — HYDROCODONE-ACETAMINOPHEN 7.5-325 MG PO TABS: 1.0000 | ORAL_TABLET | Freq: Three times a day (TID) | ORAL | Status: DC | PRN

## 2012-11-11 NOTE — Assessment & Plan Note (Signed)
She was found to have 2:1 AV block with transient CHB. She was admitted to the CCU with persistent AV block. She was not taking any AV nodal blocking medications and no reversible causes were identified. Therefore, she underwent dual chamber PPM implantation  

## 2012-11-11 NOTE — Assessment & Plan Note (Signed)
Wt Readings from Last 3 Encounters:  11/11/12 257 lb (116.574 kg)  10/11/12 260 lb 5.8 oz (118.1 kg)  10/11/12 260 lb 5.8 oz (118.1 kg)   Dietary cons

## 2012-11-11 NOTE — Assessment & Plan Note (Signed)
Continue with current prescription therapy as reflected on the Med list.  

## 2012-11-11 NOTE — Assessment & Plan Note (Signed)
Labs

## 2012-11-11 NOTE — Progress Notes (Signed)
Patient ID: Jessica [name], female   DOB: Mar 22, 1934, 79 y.o.   MRN: [id number]   Subjective:     HPI S/p pacemaker placement F/u grief - son and brother - both died The patient presents for a follow-up of  chronic hypertension, chronic dyslipidemia, type 2 diabetes controlled with medicines. C/o LBP. F/u L foot pain - chronic, better. F/u R foot cramps - driving to [place] a lot. C/o R groin pain w/walking.  S/p hosp stay 7/9-7/11: Primary Discharge Diagnosis:  1. Symptomatic 2:1 AV block with transient CHB s/p PPM implant  Secondary Discharge Diagnoses:  1. HTN  2. Dyslipidemia  3. Hypothyroidism  4. DM  5. Prior DVT/PE  6. GERD  7. Anxiety/depression  8. Osteoarthritis  Procedures This Admission:  1. 2D echo 10/10/2012 - report pending at the time of discharge  2. Dual chamber PPM implantation 10/10/2012  RA lead - Medtronic model [id number] (serial number [id number] ) right atrial lead  RV lead - Medtronic model 5092- 58 (serial number [id number]) right ventricular lead  Device - Medtronic Adapta L model ADDRL 1 (serial number [id number]) pacemaker  History and Hospital Course:  Jessica [name] is a 79 y.o. female with HTN, dyslipidemia, hypothyroidism, DM and prior DVT/PE who was admitted with dizziness and bradycardia on 10/09/2012. She was found to have 2:1 AV block with transient CHB. She was admitted to the CCU with persistent AV block. She was not taking any AV nodal blocking medications and no reversible causes were identified. Therefore, she underwent dual chamber PPM implantation yesterday. Jessica [name] tolerated this procedure well without any immediate complication. She remains hemodynamically stable and afebrile. Her chest xray shows stable lead placement without pneumothorax. Her device interrogation shows normal PPM function with stable lead parameters/measurements. Her implant site is intact without significant bleeding or hematoma. She has been given discharge  instructions including wound care and activity restrictions. She will follow-up in 10 days for wound check. There were no changes made to her medications. She has been seen, examined and deemed stable for discharge today by Dr. Hillis Range.    BP Readings from Last 3 Encounters:  11/11/12 130/90  10/11/12 159/77  10/11/12 159/77   Wt Readings from Last 3 Encounters:  11/11/12 257 lb (116.574 kg)  10/11/12 260 lb 5.8 oz (118.1 kg)  10/11/12 260 lb 5.8 oz (118.1 kg)       Review of Systems  Constitutional: Negative for chills, activity change, appetite change, fatigue and unexpected weight change.  HENT: Negative for congestion, mouth sores and sinus pressure.   Eyes: Negative for visual disturbance.  Respiratory: Negative for cough and chest tightness.   Cardiovascular: Negative for leg swelling.  Gastrointestinal: Negative for nausea and abdominal pain.  Genitourinary: Negative for frequency, difficulty urinating and vaginal pain.  Musculoskeletal: Positive for back pain, arthralgias and gait problem.  Skin: Negative for pallor and rash.  Neurological: Negative for dizziness, tremors, weakness, numbness and headaches.  Psychiatric/Behavioral: Negative for suicidal ideas, confusion and sleep disturbance. The patient is nervous/anxious.        Objective:   Physical Exam  Constitutional: She appears well-developed.  HENT:  Head: Normocephalic.  Right Ear: External ear normal.  Left Ear: External ear normal.  Nose: Nose normal.  Mouth/Throat: Oropharynx is clear and moist.  Eyes: Conjunctivae are normal. Pupils are equal, round, and reactive to light. Right eye exhibits no discharge. Left eye exhibits no discharge.  Neck: Normal range of motion. Neck supple. No JVD present.  No tracheal deviation present. No thyromegaly present.  Cardiovascular: Normal rate, regular rhythm and normal heart sounds.   Pulmonary/Chest: No stridor. No respiratory distress. She has no wheezes.   Abdominal: Soft. Bowel sounds are normal. She exhibits no distension and no mass. There is no tenderness. There is no rebound and no guarding.  Musculoskeletal: She exhibits tenderness (LS spine, L dors foot). She exhibits no edema.  Lymphadenopathy:    She has no cervical adenopathy.  Neurological: She displays normal reflexes. No cranial nerve deficit. She exhibits normal muscle tone. Coordination normal.  Skin: No rash noted. No erythema.  Psychiatric: She has a normal mood and affect. Her behavior is normal. Judgment and thought content normal.  Pacemaker - NT Not tearful  Lab Results  Component Value Date   WBC 7.7 10/09/2012   HGB 12.8 10/09/2012   HCT 38.6 10/09/2012   PLT 271 10/09/2012   GLUCOSE 135* 10/11/2012   CHOL 210* 10/10/2012   TRIG 289* 10/10/2012   HDL 39* 10/10/2012   LDLDIRECT 155.9 09/30/2012   LDLCALC 113* 10/10/2012   ALT 33 10/09/2012   AST 35 10/09/2012   NA 138 10/11/2012   K 3.6 10/11/2012   CL 104 10/11/2012   CREATININE 0.66 10/11/2012   BUN 15 10/11/2012   CO2 24 10/11/2012   TSH 3.867 10/09/2012   INR 3.3 08/26/2009   HGBA1C 6.4* 10/09/2012   MICROALBUR 4.5* 12/02/2009    A complex case. Hosp records, labs, tests  reviewed     Assessment & Plan:

## 2012-11-11 NOTE — Assessment & Plan Note (Signed)
She was found to have 2:1 AV block with transient CHB. She was admitted to the CCU with persistent AV block. She was not taking any AV nodal blocking medications and no reversible causes were identified. Therefore, she underwent dual chamber PPM implantation

## 2012-11-12 ENCOUNTER — Telehealth: Payer: Self-pay | Admitting: Internal Medicine

## 2012-11-12 NOTE — Telephone Encounter (Signed)
New Prob  Pt states that she woke up this morning and her right shoulder is sore. She is wondering if it is from the [facility] that she had placed in July.

## 2012-11-12 NOTE — Telephone Encounter (Signed)
I will forward to [facility] 

## 2012-11-12 NOTE — Telephone Encounter (Signed)
Returned pt's call at 1630. She stated she's very upset it took so long to return her call. She said she took pain pills and is fine now but this type of pain was worse than her shoulder surgery.  Pt stated, out of much frustration, that if she "was going to die it would have happened already."  Pt said she will not forget how long it took [place] to respond then hung up on me. Jessica [name]

## 2012-11-15 ENCOUNTER — Telehealth: Payer: Self-pay

## 2012-11-15 MED ORDER — NABUMETONE 500 MG PO TABS: 500.0000 mg | ORAL_TABLET | Freq: Two times a day (BID) | ORAL | Status: DC | PRN

## 2012-11-15 NOTE — Telephone Encounter (Signed)
Pt called [facility] stating that her mail order rx for Relafen has not arrived and will need a few to be sent to her local pharmacy. Pt notified

## 2012-11-17 ENCOUNTER — Encounter: Payer: Self-pay | Admitting: Internal Medicine

## 2012-12-12 ENCOUNTER — Encounter: Payer: Self-pay | Admitting: Dietician

## 2012-12-12 ENCOUNTER — Encounter: Payer: Medicare Other | Attending: Internal Medicine | Admitting: Dietician

## 2012-12-12 VITALS — Ht 62.0 in | Wt 255.0 lb

## 2012-12-12 DIAGNOSIS — Z713 Dietary counseling and surveillance: Secondary | ICD-10-CM | POA: Insufficient documentation

## 2012-12-12 DIAGNOSIS — E119 Type 2 diabetes mellitus without complications: Secondary | ICD-10-CM | POA: Insufficient documentation

## 2012-12-12 NOTE — Patient Instructions (Signed)
Goals:  Follow Diabetes Meal Plan as instructed  Eat 3 meals and 2 snacks, every 3-5 hrs  Limit carbohydrate intake to 30-45 grams carbohydrate/meal  Limit carbohydrate intake to 15 grams carbohydrate/snack  Add lean protein foods to meals/snacks  Monitor glucose levels as instructed by your doctor  Aim for 30 mins of physical activity daily  Bring food record and glucose log to your next nutrition visit 

## 2012-12-12 NOTE — Progress Notes (Signed)
Patient was seen on 12/12/12 for the first of a series of three diabetes self-management courses at the [facility].   Current HbA1c: 6.4% on 10/09/12  The following learning objectives were met by the patient during this course:   Defines the role of glucose and insulin  Identifies type of diabetes and pathophysiology  Defines the diagnostic criteria for diabetes and prediabetes  States the risk factors for Type 2 Diabetes  States the symptoms of Type 2 Diabetes  Defines Type 2 Diabetes treatment goals  Defines Type 2 Diabetes treatment options  States the rationale for glucose monitoring  Identifies A1C, glucose targets, and testing times  Identifies proper sharps disposal  Defines the purpose of a diabetes food plan  Identifies carbohydrate food groups  Defines effects of carbohydrate foods on glucose levels  Identifies carbohydrate choices/grams/food labels  States benefits of physical activity and effect on glucose  Review of suggested activity guidelines  Handouts given during class include:  Type 2 Diabetes: Basics Book  My Food Plan Book  Food and Activity Log  Your patient has identified their diabetes self-care support plan as:  [facility] support group  Follow-Up Plan: Attend core 2 and core 3

## 2013-01-09 ENCOUNTER — Encounter: Payer: Medicare Other | Attending: Internal Medicine

## 2013-01-09 DIAGNOSIS — Z713 Dietary counseling and surveillance: Secondary | ICD-10-CM | POA: Insufficient documentation

## 2013-01-09 DIAGNOSIS — E119 Type 2 diabetes mellitus without complications: Secondary | ICD-10-CM | POA: Insufficient documentation

## 2013-01-09 NOTE — Progress Notes (Signed)
  Patient was seen on 01/09/13 for the second of a series of three diabetes self-management courses at the [facility]. The following learning objectives were met by the patient during this course:   Explain basic nutrition maintenance and quality assurance  Describe causes, symptoms and treatment of hypoglycemia and hyperglycemia  Explain how to manage diabetes during illness  Describe the importance of good nutrition for health and healthy eating strategies  List strategies to follow meal plan when dining out  Describe the effects of alcohol on glucose and how to use it safely  Describe problem solving skills for day-to-day glucose challenges  Describe strategies to use when treatment plan needs to change  Identify important factors involved in successful weight loss  Describe ways to remain physically active  Describe the impact of regular activity on insulin resistance  Follow-Up Plan: Patient will attend the final class of the ADA Diabetes Self-Care Education.    

## 2013-01-14 ENCOUNTER — Telehealth: Payer: Self-pay | Admitting: Internal Medicine

## 2013-01-14 NOTE — Telephone Encounter (Signed)
OK to fill this prescription with additional refills x0 Thank you!  

## 2013-01-14 NOTE — Telephone Encounter (Signed)
Pt request written rx for hydrocodone. Please advise.

## 2013-01-15 MED ORDER — HYDROCODONE-ACETAMINOPHEN 7.5-325 MG PO TABS: 1.0000 | ORAL_TABLET | Freq: Three times a day (TID) | ORAL | Status: DC | PRN

## 2013-01-15 NOTE — Telephone Encounter (Signed)
Rx printed. Pt informed ready for p/u.

## 2013-01-29 DIAGNOSIS — E119 Type 2 diabetes mellitus without complications: Secondary | ICD-10-CM

## 2013-01-29 NOTE — Progress Notes (Signed)
Patient was seen on 01/29/13 for the third of a series of three diabetes self-management courses at the [facility]. The following learning objectives were met by the patient during this class:    State the amount of activity recommended for healthy living   Describe activities suitable for individual needs   Identify ways to regularly incorporate activity into daily life   Identify barriers to activity and ways to over come these barriers  Identify diabetes medications being personally used and their primary action for lowering glucose and possible side effects   Describe role of stress on blood glucose and develop strategies to address psychosocial issues   Identify diabetes complications and ways to prevent them  Explain how to manage diabetes during illness   Evaluate success in meeting personal goal   Establish 2-3 goals that they will plan to diligently work on until they return for the free 27-month follow-up visit  Your patient has established the following 4 month goals in their individualized success plan:  Count carbohydrates at most meals and snacks  Your patient has identified these potential barriers to change:  none  Your patient has identified their diabetes self-care support plan as  sister

## 2013-01-30 ENCOUNTER — Encounter: Payer: Self-pay | Admitting: Internal Medicine

## 2013-01-30 ENCOUNTER — Ambulatory Visit (INDEPENDENT_AMBULATORY_CARE_PROVIDER_SITE_OTHER): Payer: Medicare Other | Admitting: Internal Medicine

## 2013-01-30 VITALS — BP 134/84 | HR 62 | Ht 62.0 in | Wt 254.8 lb

## 2013-01-30 DIAGNOSIS — I480 Paroxysmal atrial fibrillation: Secondary | ICD-10-CM

## 2013-01-30 DIAGNOSIS — I10 Essential (primary) hypertension: Secondary | ICD-10-CM

## 2013-01-30 DIAGNOSIS — I4891 Unspecified atrial fibrillation: Secondary | ICD-10-CM

## 2013-01-30 DIAGNOSIS — I442 Atrioventricular block, complete: Secondary | ICD-10-CM

## 2013-01-30 DIAGNOSIS — I4892 Unspecified atrial flutter: Secondary | ICD-10-CM

## 2013-01-30 DIAGNOSIS — I441 Atrioventricular block, second degree: Secondary | ICD-10-CM

## 2013-01-30 LAB — PACEMAKER DEVICE OBSERVATION
AL THRESHOLD: 0.5 V
ATRIAL PACING PM: 14
BAMS-0001: 150 {beats}/min
RV LEAD THRESHOLD: 0.5 V

## 2013-01-30 MED ORDER — APIXABAN 5 MG PO TABS: 5.0000 mg | ORAL_TABLET | Freq: Two times a day (BID) | ORAL | Status: DC
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
## 2013-01-30 NOTE — Patient Instructions (Addendum)
Your physician recommends that you schedule a follow-up appointment in: 4 weeks with Audrie Lia, Pharm D for Eliquis  Your physician has recommended you make the following change in your medication:  1) Start Eliquis 5mg  twice daily   Your physician wants you to follow-up in: July with Dr Johney Frame Bonita Quin will receive a reminder letter in the mail two months in advance. If you don't receive a letter, please call our office to schedule the follow-up appointment.    Remote monitoring is used to monitor your Pacemaker of ICD from home. This monitoring reduces the number of office visits required to check your device to one time per year. It allows [place] to keep an eye on the functioning of your device to ensure it is working properly. You are scheduled for a device check from home on 05/05/13. You may send your transmission at any time that day. If you have a wireless device, the transmission will be sent automatically. After your physician reviews your transmission, you will receive a postcard with your next transmission date.

## 2013-02-03 ENCOUNTER — Telehealth: Payer: Self-pay | Admitting: *Deleted

## 2013-02-03 NOTE — Telephone Encounter (Signed)
[employer] [facility] PA for eliquis approved for 1 year until 01/30/2014 PA # [id number]

## 2013-02-06 ENCOUNTER — Other Ambulatory Visit (INDEPENDENT_AMBULATORY_CARE_PROVIDER_SITE_OTHER): Payer: Medicare Other

## 2013-02-06 ENCOUNTER — Other Ambulatory Visit: Payer: Self-pay

## 2013-02-06 DIAGNOSIS — M545 Low back pain, unspecified: Secondary | ICD-10-CM

## 2013-02-06 DIAGNOSIS — K219 Gastro-esophageal reflux disease without esophagitis: Secondary | ICD-10-CM

## 2013-02-06 DIAGNOSIS — I10 Essential (primary) hypertension: Secondary | ICD-10-CM

## 2013-02-06 DIAGNOSIS — E119 Type 2 diabetes mellitus without complications: Secondary | ICD-10-CM

## 2013-02-06 DIAGNOSIS — B079 Viral wart, unspecified: Secondary | ICD-10-CM

## 2013-02-06 LAB — HEPATIC FUNCTION PANEL
ALT: 47 U/L — ABNORMAL HIGH (ref 0–35)
Total Bilirubin: 0.6 mg/dL (ref 0.3–1.2)

## 2013-02-06 LAB — BASIC METABOLIC PANEL
CO2: 26 mEq/L (ref 19–32)
Chloride: 104 mEq/L (ref 96–112)
GFR: 89.08 mL/min (ref >60.00)
Glucose, Bld: 102 mg/dL — ABNORMAL HIGH (ref 70–99)
Potassium: 4 mEq/L (ref 3.5–5.1)
Sodium: 138 mEq/L (ref 135–145)

## 2013-02-11 ENCOUNTER — Encounter: Payer: Self-pay | Admitting: Internal Medicine

## 2013-02-11 ENCOUNTER — Ambulatory Visit (INDEPENDENT_AMBULATORY_CARE_PROVIDER_SITE_OTHER): Payer: Medicare Other | Admitting: Internal Medicine

## 2013-02-11 VITALS — BP 138/72 | HR 86 | Temp 97.6°F | Wt 252.0 lb

## 2013-02-11 DIAGNOSIS — Z23 Encounter for immunization: Secondary | ICD-10-CM

## 2013-02-11 DIAGNOSIS — F329 Major depressive disorder, single episode, unspecified: Secondary | ICD-10-CM

## 2013-02-11 DIAGNOSIS — I10 Essential (primary) hypertension: Secondary | ICD-10-CM

## 2013-02-11 DIAGNOSIS — N259 Disorder resulting from impaired renal tubular function, unspecified: Secondary | ICD-10-CM

## 2013-02-11 DIAGNOSIS — E119 Type 2 diabetes mellitus without complications: Secondary | ICD-10-CM

## 2013-02-11 MED ORDER — HYDROCODONE-ACETAMINOPHEN 7.5-325 MG PO TABS: 1.0000 | ORAL_TABLET | Freq: Three times a day (TID) | ORAL | Status: DC | PRN

## 2013-02-11 NOTE — Assessment & Plan Note (Signed)
Continue with current prescription therapy as reflected on the Med list.  

## 2013-02-11 NOTE — Progress Notes (Signed)
Subjective:     HPI  S/p pacemaker placement F/u grief - son and brother - both died The patient presents for a follow-up of  chronic hypertension, chronic dyslipidemia, type 2 diabetes controlled with medicines. C/o LBP. F/u L foot pain - chronic, better. F/u R foot cramps - driving to [place] a lot. C/o R groin pain w/walking.  S/p hosp stay 7/9-7/11: Primary Discharge Diagnosis:  1. Symptomatic 2:1 AV block with transient CHB s/p PPM implant  Secondary Discharge Diagnoses:  1. HTN  2. Dyslipidemia  3. Hypothyroidism  4. DM  5. Prior DVT/PE  6. GERD  7. Anxiety/depression  8. Osteoarthritis  Procedures This Admission:  1. 2D echo 10/10/2012 - report pending at the time of discharge  2. Dual chamber PPM implantation 10/10/2012  RA lead - Medtronic model [id number] (serial number [id number] ) right atrial lead  RV lead - Medtronic model [id number] (serial number [id number]) right ventricular lead  Device - Medtronic Adapta L model ADDRL 1 (serial number [id number]) pacemaker  History and Hospital Course:  Jessica [name] is a 79 y.o. female with HTN, dyslipidemia, hypothyroidism, DM and prior DVT/PE who was admitted with dizziness and bradycardia on 10/09/2012. She was found to have 2:1 AV block with transient CHB. She was admitted to the CCU with persistent AV block. She was not taking any AV nodal blocking medications and no reversible causes were identified. Therefore, she underwent dual chamber PPM implantation yesterday. Ms. Alberico tolerated this procedure well without any immediate complication. She remains hemodynamically stable and afebrile. Her chest xray shows stable lead placement without pneumothorax. Her device interrogation shows normal PPM function with stable lead parameters/measurements. Her implant site is intact without significant bleeding or hematoma. She has been given discharge instructions including wound care and activity restrictions. She will follow-up in 10  days for wound check. There were no changes made to her medications. She has been seen, examined and deemed stable for discharge today by Dr. Hillis Range.    BP Readings from Last 3 Encounters:  02/11/13 138/72  01/30/13 134/84  11/11/12 130/90   Wt Readings from Last 3 Encounters:  02/11/13 252 lb (114.306 kg)  01/30/13 254 lb 12.8 oz (115.577 kg)  12/12/12 255 lb (115.667 kg)       Review of Systems  Constitutional: Negative for chills, activity change, appetite change, fatigue and unexpected weight change.  HENT: Negative for congestion, mouth sores and sinus pressure.   Eyes: Negative for visual disturbance.  Respiratory: Negative for cough and chest tightness.   Cardiovascular: Negative for leg swelling.  Gastrointestinal: Negative for nausea and abdominal pain.  Genitourinary: Negative for frequency, difficulty urinating and vaginal pain.  Musculoskeletal: Positive for arthralgias, back pain and gait problem.  Skin: Negative for pallor and rash.  Neurological: Negative for dizziness, tremors, weakness, numbness and headaches.  Psychiatric/Behavioral: Negative for suicidal ideas, confusion and sleep disturbance. The patient is nervous/anxious.        Objective:   Physical Exam  Constitutional: She appears well-developed.  HENT:  Head: Normocephalic.  Right Ear: External ear normal.  Left Ear: External ear normal.  Nose: Nose normal.  Mouth/Throat: Oropharynx is clear and moist.  Eyes: Conjunctivae are normal. Pupils are equal, round, and reactive to light. Right eye exhibits no discharge. Left eye exhibits no discharge.  Neck: Normal range of motion. Neck supple. No JVD present. No tracheal deviation present. No thyromegaly present.  Cardiovascular: Normal rate, regular rhythm and normal heart sounds.  Pulmonary/Chest: No stridor. No respiratory distress. She has no wheezes.  Abdominal: Soft. Bowel sounds are normal. She exhibits no distension and no mass. There  is no tenderness. There is no rebound and no guarding.  Musculoskeletal: She exhibits tenderness (LS spine, L dors foot). She exhibits no edema.  Lymphadenopathy:    She has no cervical adenopathy.  Neurological: She displays normal reflexes. No cranial nerve deficit. She exhibits normal muscle tone. Coordination normal.  Skin: No rash noted. No erythema.  Psychiatric: She has a normal mood and affect. Her behavior is normal. Judgment and thought content normal.  Pacemaker - NT Not tearful  Lab Results  Component Value Date   WBC 7.7 10/09/2012   HGB 12.8 10/09/2012   HCT 38.6 10/09/2012   PLT 271 10/09/2012   GLUCOSE 102* 02/06/2013   CHOL 210* 10/10/2012   TRIG 289* 10/10/2012   HDL 39* 10/10/2012   LDLDIRECT 155.9 09/30/2012   LDLCALC 113* 10/10/2012   ALT 47* 02/06/2013   AST 45* 02/06/2013   NA 138 02/06/2013   K 4.0 02/06/2013   CL 104 02/06/2013   CREATININE 0.7 02/06/2013   BUN 20 02/06/2013   CO2 26 02/06/2013   TSH 3.867 10/09/2012   INR 3.3 08/26/2009   HGBA1C 6.9* 02/06/2013   MICROALBUR 4.5* 12/02/2009         Assessment & Plan:

## 2013-02-11 NOTE — Progress Notes (Signed)
Pre visit review using our clinic review tool, if applicable. No additional management support is needed unless otherwise documented below in the visit note. 

## 2013-02-11 NOTE — Assessment & Plan Note (Signed)
Wt Readings from Last 3 Encounters:  02/11/13 252 lb (114.306 kg)  01/30/13 254 lb 12.8 oz (115.577 kg)  12/12/12 255 lb (115.667 kg)

## 2013-02-14 ENCOUNTER — Telehealth: Payer: Self-pay

## 2013-02-14 NOTE — Telephone Encounter (Signed)
Swelling is not likely to be caused by mentioned above meds. OV w/any provider or Sat clinic Elevate legs Thx

## 2013-02-14 NOTE — Telephone Encounter (Signed)
Patient notified of MD message and was transferred to front desk to make an appt for tomorrow

## 2013-02-14 NOTE — Telephone Encounter (Signed)
Phone call from patient [phone]) stating her feet and left leg are swollen. She does not feel sick. She recently started on Eliquis and an eye vitamin. Not sure if an interaction with the two. Please advise. Thanks.

## 2013-02-15 ENCOUNTER — Ambulatory Visit (INDEPENDENT_AMBULATORY_CARE_PROVIDER_SITE_OTHER): Payer: Medicare Other | Admitting: Family Medicine

## 2013-02-15 ENCOUNTER — Ambulatory Visit (HOSPITAL_COMMUNITY)
Admission: RE | Admit: 2013-02-15 | Discharge: 2013-02-15 | Disposition: A | Discharge status: Home / Self Care | Payer: Medicare Other | Source: Ambulatory Visit | Attending: Family Medicine | Admitting: Family Medicine

## 2013-02-15 ENCOUNTER — Encounter: Payer: Self-pay | Admitting: Family Medicine

## 2013-02-15 VITALS — BP 138/80 | HR 99 | Temp 97.7°F | Ht 62.0 in | Wt 254.1 lb

## 2013-02-15 DIAGNOSIS — M79609 Pain in unspecified limb: Secondary | ICD-10-CM

## 2013-02-15 DIAGNOSIS — R609 Edema, unspecified: Secondary | ICD-10-CM

## 2013-02-15 DIAGNOSIS — L0390 Cellulitis, unspecified: Secondary | ICD-10-CM

## 2013-02-15 DIAGNOSIS — R599 Enlarged lymph nodes, unspecified: Secondary | ICD-10-CM | POA: Insufficient documentation

## 2013-02-15 DIAGNOSIS — E119 Type 2 diabetes mellitus without complications: Secondary | ICD-10-CM

## 2013-02-15 DIAGNOSIS — L0291 Cutaneous abscess, unspecified: Secondary | ICD-10-CM

## 2013-02-15 DIAGNOSIS — M7989 Other specified soft tissue disorders: Secondary | ICD-10-CM | POA: Insufficient documentation

## 2013-02-15 DIAGNOSIS — M79662 Pain in left lower leg: Secondary | ICD-10-CM

## 2013-02-15 DIAGNOSIS — Z23 Encounter for immunization: Secondary | ICD-10-CM

## 2013-02-15 DIAGNOSIS — I10 Essential (primary) hypertension: Secondary | ICD-10-CM

## 2013-02-15 MED ORDER — CEFDINIR 300 MG PO CAPS: 300.0000 mg | ORAL_CAPSULE | Freq: Two times a day (BID) | ORAL | Status: AC

## 2013-02-15 MED ORDER — FUROSEMIDE 20 MG PO TABS: ORAL_TABLET | ORAL | Status: DC

## 2013-02-15 NOTE — Progress Notes (Signed)
VASCULAR LAB PRELIMINARY  PRELIMINARY  PRELIMINARY  PRELIMINARY  Left lower extremity venous Doppler completed.    Preliminary report:  There is no DVT or SVT noted in the left lower extremity.  Jenyfer Trawick, RVT 02/15/2013, 5:16 PM

## 2013-02-15 NOTE — Patient Instructions (Signed)
Go to ER at [facility] and tell them you ar ethere to see Vascular they are expecting you to r/o a DVT/Blood clot   Edema Edema is an abnormal build-up of fluids in tissues. Because this is partly dependent on gravity (water flows to the lowest place), it is more common in the legs and thighs (lower extremities). It is also common in the looser tissues, like around the eyes. Painless swelling of the feet and ankles is common and increases as a person ages. It may affect both legs and may include the calves or even thighs. When squeezed, the fluid may move out of the affected area and may leave a dent for a few moments. CAUSES   Prolonged standing or sitting in one place for extended periods of time. Movement helps pump tissue fluid into the veins, and absence of movement prevents this, resulting in edema.  Varicose veins. The valves in the veins do not work as well as they should. This causes fluid to leak into the tissues.  Fluid and salt overload.  Injury, burn, or surgery to the leg, ankle, or foot, may damage veins and allow fluid to leak out.  Sunburn damages vessels. Leaky vessels allow fluid to go out into the sunburned tissues.  Allergies (from insect bites or stings, medications or chemicals) cause swelling by allowing vessels to become leaky.  Protein in the blood helps keep fluid in your vessels. Low protein, as in malnutrition, allows fluid to leak out.  Hormonal changes, including pregnancy and menstruation, cause fluid retention. This fluid may leak out of vessels and cause edema.  Medications that cause fluid retention. Examples are sex hormones, blood pressure medications, steroid treatment, or anti-depressants.  Some illnesses cause edema, especially heart failure, kidney disease, or liver disease.  Surgery that cuts veins or lymph nodes, such as surgery done for the heart or for breast cancer, may result in edema. DIAGNOSIS  Your caregiver is usually easily able to determine  what is causing your swelling (edema) by simply asking what is wrong (getting a history) and examining you (doing a physical). Sometimes x-rays, EKG (electrocardiogram or heart tracing), and blood work may be done to evaluate for underlying medical illness. TREATMENT  General treatment includes:  Leg elevation (or elevation of the affected body part).  Restriction of fluid intake.  Prevention of fluid overload.  Compression of the affected body part. Compression with elastic bandages or support stockings squeezes the tissues, preventing fluid from entering and forcing it back into the blood vessels.  Diuretics (also called water pills or fluid pills) pull fluid out of your body in the form of increased urination. These are effective in reducing the swelling, but can have side effects and must be used only under your caregiver's supervision. Diuretics are appropriate only for some types of edema. The specific treatment can be directed at any underlying causes discovered. Heart, liver, or kidney disease should be treated appropriately. HOME CARE INSTRUCTIONS   Elevate the legs (or affected body part) above the level of the heart, while lying down.  Avoid sitting or standing still for prolonged periods of time.  Avoid putting anything directly under the knees when lying down, and do not wear constricting clothing or garters on the upper legs.  Exercising the legs causes the fluid to work back into the veins and lymphatic channels. This may help the swelling go down.  The pressure applied by elastic bandages or support stockings can help reduce ankle swelling.  A low-salt diet  may help reduce fluid retention and decrease the ankle swelling.  Take any medications exactly as prescribed. SEEK MEDICAL CARE IF:  Your edema is not responding to recommended treatments. SEEK IMMEDIATE MEDICAL CARE IF:   You develop shortness of breath or chest pain.  You cannot breathe when you lay down; or  if, while lying down, you have to get up and go to the window to get your breath.  You are having increasing swelling without relief from treatment.  You develop a fever over 102 F (38.9 C).  You develop pain or redness in the areas that are swollen.  Tell your caregiver right away if you have gained 03 lb/1.4 kg in 1 day or 05 lb/2.3 kg in a week. MAKE SURE YOU:   Understand these instructions.  Will watch your condition.  Will get help right away if you are not doing well or get worse. Document Released: 03/20/2005 Document Revised: 09/19/2011 Document Reviewed: 11/06/2007 [facility] Patient Information 2014 [place], [place].

## 2013-02-15 NOTE — Assessment & Plan Note (Signed)
Minimize simple carbs and continue current meds
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
## 2013-02-15 NOTE — Assessment & Plan Note (Signed)
Adequate control, minimize sodium

## 2013-02-15 NOTE — Assessment & Plan Note (Signed)
Increase Torsemide to tid and minimize sodium and elevate feet to above feet. Did run a venous doppler to rule out DVT which was negative except for a large Lymph node in the left groin. Her leg is also mildly fluctuant and erythematous just above ankle suggestive of cellulitis. Started on Omnicef and probiotics and she will return if no improvement

## 2013-02-15 NOTE — Progress Notes (Signed)
Pre visit review using our clinic review tool, if applicable. No additional management support is needed unless otherwise documented below in the visit note. 

## 2013-02-20 ENCOUNTER — Telehealth: Payer: Self-pay | Admitting: *Deleted

## 2013-02-20 DIAGNOSIS — I480 Paroxysmal atrial fibrillation: Secondary | ICD-10-CM

## 2013-02-20 DIAGNOSIS — I4892 Unspecified atrial flutter: Secondary | ICD-10-CM

## 2013-02-20 MED ORDER — APIXABAN 5 MG PO TABS: 5.0000 mg | ORAL_TABLET | Freq: Two times a day (BID) | ORAL | Status: DC

## 2013-02-20 NOTE — Telephone Encounter (Signed)
refill 

## 2013-02-26 ENCOUNTER — Ambulatory Visit (INDEPENDENT_AMBULATORY_CARE_PROVIDER_SITE_OTHER): Payer: Medicare Other | Admitting: Pharmacist

## 2013-02-26 DIAGNOSIS — I4891 Unspecified atrial fibrillation: Secondary | ICD-10-CM

## 2013-02-26 LAB — CBC
Hemoglobin: 13.7 g/dL (ref 12.0–15.0)
MCV: 89.3 fl (ref 78.0–100.0)
Platelets: 371 10*3/uL (ref 150.0–400.0)
RBC: 4.57 Mil/uL (ref 3.87–5.11)

## 2013-03-03 NOTE — Progress Notes (Signed)
Pt was started on Eliquis for Atrial Fibrillation on 01/30/2013.  Reviewed patients medication list.  Pt is not currently on any combined P-gp and strong CYP3A4 inhibitors/inducers (ketoconazole, traconazole, ritonavir, carbamazepine, phenytoin, rifampin, St. John's wort).  Reviewed labs.  SCr 0.7, Weight 115 kg, Age 79.  Dose appropriate based on CrCl.   Hgb and HCT Within Normal Limits  A full discussion of the nature of anticoagulants has been carried out.  A benefit/risk analysis has been presented to the patient, so that they understand the justification for choosing anticoagulation with Eliquis at this time.  The need for compliance is stressed.  Pt is aware to take the medication twice daily.  Side effects of potential bleeding are discussed, including unusual colored urine or stools, coughing up blood or coffee ground emesis, nose bleeds or serious fall or head trauma.  Discussed signs and symptoms of stroke. The patient should avoid any OTC items containing aspirin or ibuprofen.  Avoid alcohol consumption.   Call if any signs of abnormal bleeding.  Discussed financial obligations and resolved any difficulty in obtaining medication.  Next lab test test in 6 months.

## 2013-03-17 ENCOUNTER — Ambulatory Visit (INDEPENDENT_AMBULATORY_CARE_PROVIDER_SITE_OTHER): Payer: Medicare Other | Admitting: *Deleted

## 2013-03-17 DIAGNOSIS — I441 Atrioventricular block, second degree: Secondary | ICD-10-CM

## 2013-03-18 ENCOUNTER — Ambulatory Visit (INDEPENDENT_AMBULATORY_CARE_PROVIDER_SITE_OTHER): Payer: Medicare Other | Admitting: Family Medicine

## 2013-03-18 ENCOUNTER — Encounter: Payer: Self-pay | Admitting: Internal Medicine

## 2013-03-18 ENCOUNTER — Encounter: Payer: Self-pay | Admitting: Family Medicine

## 2013-03-18 ENCOUNTER — Ambulatory Visit (INDEPENDENT_AMBULATORY_CARE_PROVIDER_SITE_OTHER): Payer: Medicare Other | Admitting: Internal Medicine

## 2013-03-18 VITALS — BP 120/80 | HR 72 | Temp 98.2°F | Wt 248.0 lb

## 2013-03-18 DIAGNOSIS — I480 Paroxysmal atrial fibrillation: Secondary | ICD-10-CM

## 2013-03-18 DIAGNOSIS — M79609 Pain in unspecified limb: Secondary | ICD-10-CM

## 2013-03-18 DIAGNOSIS — M545 Low back pain: Secondary | ICD-10-CM

## 2013-03-18 DIAGNOSIS — M79672 Pain in left foot: Secondary | ICD-10-CM | POA: Insufficient documentation

## 2013-03-18 DIAGNOSIS — S86119A Strain of other muscle(s) and tendon(s) of posterior muscle group at lower leg level, unspecified leg, initial encounter: Secondary | ICD-10-CM | POA: Insufficient documentation

## 2013-03-18 DIAGNOSIS — S838X9A Sprain of other specified parts of unspecified knee, initial encounter: Secondary | ICD-10-CM

## 2013-03-18 DIAGNOSIS — I10 Essential (primary) hypertension: Secondary | ICD-10-CM

## 2013-03-18 DIAGNOSIS — M79662 Pain in left lower leg: Secondary | ICD-10-CM

## 2013-03-18 DIAGNOSIS — S86112A Strain of other muscle(s) and tendon(s) of posterior muscle group at lower leg level, left leg, initial encounter: Secondary | ICD-10-CM

## 2013-03-18 DIAGNOSIS — I4891 Unspecified atrial fibrillation: Secondary | ICD-10-CM

## 2013-03-18 LAB — MDC_IDC_ENUM_SESS_TYPE_INCLINIC
Battery Impedance: 100 Ohm
Brady Statistic AS VS Percent: 1 %
Lead Channel Impedance Value: 542 Ohm
Lead Channel Impedance Value: 699 Ohm
Lead Channel Pacing Threshold Pulse Width: 0.4 ms
Lead Channel Sensing Intrinsic Amplitude: 8 mV
Lead Channel Setting Pacing Amplitude: 2 V
Lead Channel Setting Sensing Sensitivity: 4 mV

## 2013-03-18 MED ORDER — NEOMYCIN-POLYMYXIN-HC 3.5-10000-1 OT SOLN: 3.0000 [drp] | Freq: Three times a day (TID) | OTIC | Status: DC
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
## 2013-03-18 NOTE — Progress Notes (Signed)
Pre-visit discussion using our clinic review tool. No additional management support is needed unless otherwise documented below in the visit note.  

## 2013-03-18 NOTE — Assessment & Plan Note (Signed)
12/14 likely a shin splint Sports Med ref Dr Katrinka Blazing

## 2013-03-18 NOTE — Assessment & Plan Note (Signed)
Wt Readings from Last 3 Encounters:  03/18/13 248 lb (112.492 kg)  02/15/13 254 lb 1.3 oz (115.25 kg)  02/11/13 252 lb (114.306 kg)
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
## 2013-03-18 NOTE — Assessment & Plan Note (Signed)
Continue with current prescription therapy as reflected on the Med list.  
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
## 2013-03-18 NOTE — Assessment & Plan Note (Signed)
Discussed with patient at great length. This appears to be more muscular in nature. Patient does have a past medical history significant for a DVT of his left leg previously. Patient states that this does not feel very similar to that at all. Patient will wear the compression sleeve, given home exercises, as well as discussed icing protocol. We did also discussed potentially trying to change shoe choices. Patient knows though she gets any shortness of breath or if the leg seems to get worse she needs to come back for seek medical attention immediately. Patient is not overly concerned and did not want an ultrasound. Patient is to followup in one to 2 weeks to make sure she continues to improve.

## 2013-03-18 NOTE — Patient Instructions (Signed)
Very nice to meet you Try the compression sleeve. Wear it in the day but not at night Wear the heel lift in the shoe to take pressure off the calf.  Try to do these exercises 1 time a day most days of the week.  Ice 20 minutes 2 times a day If not better in 2 weeks come see me again.

## 2013-03-18 NOTE — Patient Instructions (Signed)
Wt Readings from Last 3 Encounters:  03/18/13 248 lb (112.492 kg)  02/15/13 254 lb 1.3 oz (115.25 kg)  02/11/13 252 lb (114.306 kg)    

## 2013-03-18 NOTE — Progress Notes (Signed)
Pre visit review using our clinic review tool, if applicable. No additional management support is needed unless otherwise documented below in the visit note. 

## 2013-03-19 ENCOUNTER — Ambulatory Visit: Payer: Medicare Other | Admitting: Family Medicine

## 2013-04-08 ENCOUNTER — Telehealth: Payer: Self-pay | Admitting: *Deleted

## 2013-04-08 NOTE — Telephone Encounter (Signed)
OK to fill this prescription with additional refills x0 Thank you!  

## 2013-04-08 NOTE — Telephone Encounter (Signed)
Patient phoned requesting hydrocodone script refill.  Please advise.   CB# [phone]

## 2013-04-09 ENCOUNTER — Other Ambulatory Visit: Payer: Self-pay | Admitting: *Deleted

## 2013-04-09 MED ORDER — HYDROCODONE-ACETAMINOPHEN 7.5-325 MG PO TABS: 1.0000 | ORAL_TABLET | Freq: Three times a day (TID) | ORAL | Status: DC | PRN

## 2013-04-09 NOTE — Telephone Encounter (Signed)
Script printed & awaiting MD signature 

## 2013-04-10 NOTE — Telephone Encounter (Signed)
Pt is aware RX is ready to pick up.

## 2013-04-14 ENCOUNTER — Other Ambulatory Visit (INDEPENDENT_AMBULATORY_CARE_PROVIDER_SITE_OTHER): Payer: Medicare HMO

## 2013-04-14 ENCOUNTER — Ambulatory Visit (INDEPENDENT_AMBULATORY_CARE_PROVIDER_SITE_OTHER): Payer: Medicare HMO | Admitting: Internal Medicine

## 2013-04-14 ENCOUNTER — Encounter: Payer: Self-pay | Admitting: Internal Medicine

## 2013-04-14 VITALS — BP 110/72 | HR 84 | Temp 98.1°F | Resp 16 | Wt 259.0 lb

## 2013-04-14 DIAGNOSIS — I10 Essential (primary) hypertension: Secondary | ICD-10-CM

## 2013-04-14 DIAGNOSIS — R1900 Intra-abdominal and pelvic swelling, mass and lump, unspecified site: Secondary | ICD-10-CM

## 2013-04-14 DIAGNOSIS — M79609 Pain in unspecified limb: Secondary | ICD-10-CM

## 2013-04-14 DIAGNOSIS — E119 Type 2 diabetes mellitus without complications: Secondary | ICD-10-CM

## 2013-04-14 DIAGNOSIS — R609 Edema, unspecified: Secondary | ICD-10-CM

## 2013-04-14 DIAGNOSIS — R198 Other specified symptoms and signs involving the digestive system and abdomen: Secondary | ICD-10-CM

## 2013-04-14 DIAGNOSIS — M545 Low back pain, unspecified: Secondary | ICD-10-CM

## 2013-04-14 DIAGNOSIS — R21 Rash and other nonspecific skin eruption: Secondary | ICD-10-CM | POA: Insufficient documentation

## 2013-04-14 LAB — BASIC METABOLIC PANEL
BUN: 19 mg/dL (ref 6–23)
CALCIUM: 9.8 mg/dL (ref 8.4–10.5)
CHLORIDE: 101 meq/L (ref 96–112)
CO2: 26 meq/L (ref 19–32)
CREATININE: 0.8 mg/dL (ref 0.4–1.2)
GFR: 76 mL/min (ref >60.00)
GLUCOSE: 133 mg/dL — AB (ref 70–99)
Potassium: 3.8 mEq/L (ref 3.5–5.1)
Sodium: 138 mEq/L (ref 135–145)

## 2013-04-14 LAB — HEMOGLOBIN A1C: Hgb A1c MFr Bld: 6.8 % — ABNORMAL HIGH (ref 4.6–6.5)

## 2013-04-14 MED ORDER — TRIAMCINOLONE ACETONIDE 0.5 % EX OINT: 1.0000 "application " | TOPICAL_OINTMENT | Freq: Three times a day (TID) | CUTANEOUS | Status: DC
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
## 2013-04-14 NOTE — Assessment & Plan Note (Signed)
Wt Readings from Last 3 Encounters:  04/14/13 259 lb (117.482 kg)  03/18/13 248 lb (112.492 kg)  03/18/13 248 lb (112.492 kg)

## 2013-04-14 NOTE — Assessment & Plan Note (Signed)
Better  

## 2013-04-14 NOTE — Assessment & Plan Note (Signed)
1/15 R arm rash Triamcinolone bid

## 2013-04-14 NOTE — Progress Notes (Signed)
Pre visit review using our clinic review tool, if applicable. No additional management support is needed unless otherwise documented below in the visit note. 

## 2013-04-14 NOTE — Assessment & Plan Note (Signed)
Continue with current prescription therapy as reflected on the Med list.  
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
## 2013-04-23 ENCOUNTER — Ambulatory Visit (INDEPENDENT_AMBULATORY_CARE_PROVIDER_SITE_OTHER)
Admission: RE | Admit: 2013-04-23 | Discharge: 2013-04-23 | Disposition: A | Discharge status: Home / Self Care | Payer: Medicare HMO | Source: Ambulatory Visit | Attending: Internal Medicine | Admitting: Internal Medicine

## 2013-04-23 DIAGNOSIS — R609 Edema, unspecified: Secondary | ICD-10-CM

## 2013-04-23 DIAGNOSIS — R1900 Intra-abdominal and pelvic swelling, mass and lump, unspecified site: Secondary | ICD-10-CM

## 2013-04-23 MED ORDER — IOHEXOL 300 MG/ML  SOLN
100.0000 mL | Freq: Once | INTRAMUSCULAR | Status: AC | PRN
  Administered 2013-04-23: 100 mL via INTRAVENOUS

## 2013-04-28 ENCOUNTER — Encounter: Payer: Self-pay | Admitting: Internal Medicine

## 2013-05-05 ENCOUNTER — Encounter: Payer: Medicare Other | Admitting: *Deleted

## 2013-05-06 ENCOUNTER — Telehealth: Payer: Self-pay | Admitting: *Deleted

## 2013-05-06 NOTE — Telephone Encounter (Signed)
pls forward to who is in charge Thx

## 2013-05-06 NOTE — Telephone Encounter (Signed)
Please review prior communication & advise.

## 2013-05-06 NOTE — Telephone Encounter (Signed)
Patient phoned, stating that she had received a bill for ~$700 for recent abdominal CT.  States that if ordering provider will provide a referral order, her insurance company will pay.  States number to call is [phone].    CB# [id number]

## 2013-05-15 ENCOUNTER — Encounter: Payer: Self-pay | Admitting: *Deleted

## 2013-05-15 ENCOUNTER — Encounter: Payer: Self-pay | Admitting: Internal Medicine

## 2013-05-15 ENCOUNTER — Ambulatory Visit (INDEPENDENT_AMBULATORY_CARE_PROVIDER_SITE_OTHER): Payer: Medicare HMO | Admitting: Internal Medicine

## 2013-05-15 VITALS — BP 138/78 | HR 85 | Temp 98.0°F | Wt 255.0 lb

## 2013-05-15 DIAGNOSIS — I10 Essential (primary) hypertension: Secondary | ICD-10-CM

## 2013-05-15 DIAGNOSIS — R635 Abnormal weight gain: Secondary | ICD-10-CM

## 2013-05-15 DIAGNOSIS — E785 Hyperlipidemia, unspecified: Secondary | ICD-10-CM

## 2013-05-15 DIAGNOSIS — H9209 Otalgia, unspecified ear: Secondary | ICD-10-CM

## 2013-05-15 DIAGNOSIS — M545 Low back pain, unspecified: Secondary | ICD-10-CM

## 2013-05-15 DIAGNOSIS — E039 Hypothyroidism, unspecified: Secondary | ICD-10-CM

## 2013-05-15 DIAGNOSIS — K219 Gastro-esophageal reflux disease without esophagitis: Secondary | ICD-10-CM

## 2013-05-15 DIAGNOSIS — H9202 Otalgia, left ear: Secondary | ICD-10-CM | POA: Insufficient documentation

## 2013-05-15 DIAGNOSIS — N259 Disorder resulting from impaired renal tubular function, unspecified: Secondary | ICD-10-CM

## 2013-05-15 MED ORDER — FLUTICASONE PROPIONATE 50 MCG/ACT NA SUSP: 2.0000 | Freq: Every day | NASAL | Status: DC

## 2013-05-15 MED ORDER — CEFUROXIME AXETIL 500 MG PO TABS: ORAL_TABLET | ORAL | Status: DC

## 2013-05-15 NOTE — Assessment & Plan Note (Signed)
Continue with current prescription therapy as reflected on the Med list.  

## 2013-05-15 NOTE — Progress Notes (Signed)
Pre-visit discussion using our clinic review tool. No additional management support is needed unless otherwise documented below in the visit note.  
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
## 2013-05-15 NOTE — Assessment & Plan Note (Signed)
Wt Readings from Last 3 Encounters:  05/15/13 255 lb (115.667 kg)  04/14/13 259 lb (117.482 kg)  03/18/13 248 lb (112.492 kg)

## 2013-05-15 NOTE — Assessment & Plan Note (Signed)
  On diet  

## 2013-05-15 NOTE — Assessment & Plan Note (Signed)
Labs
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
## 2013-05-16 ENCOUNTER — Other Ambulatory Visit: Payer: Self-pay | Admitting: *Deleted

## 2013-05-16 ENCOUNTER — Telehealth: Payer: Self-pay | Admitting: *Deleted

## 2013-05-16 MED ORDER — NABUMETONE 500 MG PO TABS: 500.0000 mg | ORAL_TABLET | Freq: Every day | ORAL | Status: DC

## 2013-05-16 MED ORDER — TRIAMTERENE-HCTZ 37.5-25 MG PO TABS: 1.0000 | ORAL_TABLET | Freq: Every day | ORAL | Status: DC

## 2013-05-16 MED ORDER — ALLOPURINOL 100 MG PO TABS: 100.0000 mg | ORAL_TABLET | Freq: Every day | ORAL | Status: DC

## 2013-05-16 NOTE — Telephone Encounter (Signed)
Rf req for Lorazepam 1 mg. Ok to Rf 90 day supply?

## 2013-05-16 NOTE — Telephone Encounter (Signed)
Also, Rf req for Temazepam 15 mg. 90 day supply. Ok?

## 2013-05-17 NOTE — Telephone Encounter (Signed)
OK to fill this prescription with additional refills x3 Thank you!  

## 2013-05-19 MED ORDER — TEMAZEPAM 15 MG PO CAPS: 15.0000 mg | ORAL_CAPSULE | Freq: Every evening | ORAL | Status: DC | PRN

## 2013-05-19 MED ORDER — LORAZEPAM 0.5 MG PO TABS: 0.5000 mg | ORAL_TABLET | Freq: Every day | ORAL | Status: DC

## 2013-05-19 NOTE — Telephone Encounter (Signed)
Rxs printed and faxed to [facility].

## 2013-05-21 ENCOUNTER — Encounter: Payer: Self-pay | Admitting: *Deleted

## 2013-05-21 ENCOUNTER — Telehealth: Payer: Self-pay | Admitting: Internal Medicine

## 2013-05-21 DIAGNOSIS — I480 Paroxysmal atrial fibrillation: Secondary | ICD-10-CM

## 2013-05-21 DIAGNOSIS — I4892 Unspecified atrial flutter: Secondary | ICD-10-CM

## 2013-05-21 NOTE — Telephone Encounter (Signed)
Patient has changed insurance companies to [employer] and needs an authorization on her current meds. Patient is not sure which med (could be all meds).  She request you fax authorization to [id number].  Please call if questions.

## 2013-05-23 ENCOUNTER — Ambulatory Visit (INDEPENDENT_AMBULATORY_CARE_PROVIDER_SITE_OTHER): Payer: Medicare HMO

## 2013-05-23 ENCOUNTER — Telehealth: Payer: Self-pay | Admitting: Internal Medicine

## 2013-05-23 ENCOUNTER — Encounter: Payer: Self-pay | Admitting: Internal Medicine

## 2013-05-23 DIAGNOSIS — I441 Atrioventricular block, second degree: Secondary | ICD-10-CM

## 2013-05-23 MED ORDER — VENLAFAXINE HCL ER 75 MG PO CP24: 75.0000 mg | ORAL_CAPSULE | Freq: Every day | ORAL | Status: DC

## 2013-05-23 MED ORDER — APIXABAN 5 MG PO TABS: 5.0000 mg | ORAL_TABLET | Freq: Two times a day (BID) | ORAL | Status: DC

## 2013-05-23 MED ORDER — OMEPRAZOLE 40 MG PO CPDR: 40.0000 mg | DELAYED_RELEASE_CAPSULE | Freq: Every day | ORAL | Status: DC

## 2013-05-23 NOTE — Telephone Encounter (Signed)
Transmission received, patient aware. 

## 2013-05-23 NOTE — Telephone Encounter (Signed)
Pt just needed Refills. Refills sent to [facility].Pt informed

## 2013-05-23 NOTE — Telephone Encounter (Signed)
New Prob    Pt calling to see if her transmission was received. States this is her second attempt. Please call.

## 2013-05-24 LAB — MDC_IDC_ENUM_SESS_TYPE_REMOTE
Battery Impedance: 100 Ohm
Battery Voltage: 2.8 V
Brady Statistic AP VP Percent: 9 %
Brady Statistic AP VS Percent: 0 %
Brady Statistic AS VS Percent: 0 %
Date Time Interrogation Session: 20150220214634
Lead Channel Impedance Value: 481 Ohm
Lead Channel Impedance Value: 838 Ohm
Lead Channel Pacing Threshold Pulse Width: 0.4 ms
Lead Channel Sensing Intrinsic Amplitude: 0.7 mV
Lead Channel Sensing Intrinsic Amplitude: 4 mV
Lead Channel Setting Pacing Amplitude: 2 V
Lead Channel Setting Pacing Amplitude: 2.5 V
Lead Channel Setting Pacing Pulse Width: 0.4 ms
Lead Channel Setting Sensing Sensitivity: 4 mV
MDC IDC MSMT BATTERY REMAINING LONGEVITY: 145 mo
MDC IDC MSMT LEADCHNL RA PACING THRESHOLD AMPLITUDE: 0.5 V
MDC IDC MSMT LEADCHNL RV PACING THRESHOLD AMPLITUDE: 0.375 V
MDC IDC MSMT LEADCHNL RV PACING THRESHOLD PULSEWIDTH: 0.4 ms
MDC IDC STAT BRADY AS VP PERCENT: 91 %

## 2013-05-28 ENCOUNTER — Telehealth: Payer: Self-pay | Admitting: Internal Medicine

## 2013-05-28 MED ORDER — HYDROCODONE-ACETAMINOPHEN 7.5-325 MG PO TABS: 1.0000 | ORAL_TABLET | Freq: Three times a day (TID) | ORAL | Status: DC | PRN

## 2013-05-28 NOTE — Telephone Encounter (Signed)
OK to fill this prescription with additional refills x0 Thank you!  

## 2013-05-28 NOTE — Telephone Encounter (Signed)
Pt request refill for hydrocodone. Please call pt °

## 2013-05-28 NOTE — Telephone Encounter (Signed)
Rx printed/signed/upfront for p/u. Pt informed  

## 2013-06-02 ENCOUNTER — Encounter: Payer: Self-pay | Admitting: *Deleted

## 2013-06-02 ENCOUNTER — Telehealth: Payer: Self-pay

## 2013-06-02 MED ORDER — LORAZEPAM 0.5 MG PO TABS: 0.5000 mg | ORAL_TABLET | Freq: Two times a day (BID) | ORAL | Status: DC | PRN

## 2013-06-02 NOTE — Telephone Encounter (Signed)
The patient called and is hoping to get another 90 day rx of Lorazepam sent through her mail order pharmacy. She states she usually takes two pills per day, but was only sent enough for one pill per day.

## 2013-06-02 NOTE — Telephone Encounter (Signed)
ok 

## 2013-06-03 ENCOUNTER — Ambulatory Visit: Payer: Medicare Other | Admitting: *Deleted

## 2013-06-03 NOTE — Telephone Encounter (Signed)
Rx faxed to pharmacy  

## 2013-06-25 ENCOUNTER — Ambulatory Visit: Payer: Commercial Managed Care - HMO | Admitting: *Deleted

## 2013-07-07 ENCOUNTER — Ambulatory Visit: Payer: Medicare HMO

## 2013-07-11 ENCOUNTER — Telehealth: Payer: Self-pay | Admitting: Internal Medicine

## 2013-07-11 NOTE — Telephone Encounter (Signed)
Pt is having a bowel problem.  The only thing coming out is water since last Saturday.  Not a lot of pain.  No swelling.  She thinks she may have an impaction.  She has not taken anything for it.  She has taken allergy medicine.

## 2013-07-12 ENCOUNTER — Encounter: Payer: Self-pay | Admitting: Internal Medicine

## 2013-07-12 ENCOUNTER — Ambulatory Visit (INDEPENDENT_AMBULATORY_CARE_PROVIDER_SITE_OTHER): Payer: Commercial Managed Care - HMO | Admitting: Internal Medicine

## 2013-07-12 VITALS — BP 136/80 | HR 79 | Temp 97.2°F | Resp 20 | Ht 62.0 in | Wt 251.0 lb

## 2013-07-12 DIAGNOSIS — R197 Diarrhea, unspecified: Secondary | ICD-10-CM | POA: Diagnosis not present

## 2013-07-12 NOTE — Patient Instructions (Signed)
Please restart your probiotic till your bowels are back to normal.

## 2013-07-12 NOTE — Progress Notes (Signed)
Pre-visit discussion using our clinic review tool. No additional management support is needed unless otherwise documented below in the visit note.  
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
## 2013-07-12 NOTE — Assessment & Plan Note (Signed)
Sounds like self limited viral gastroenteritis Not dehydrated Not diverticular disease  Reassured--no reason to worry about cancer (UTD on colon) or obstruction Recommended that she restart her probiotic

## 2013-07-14 ENCOUNTER — Telehealth: Payer: Self-pay | Admitting: *Deleted

## 2013-07-14 NOTE — Telephone Encounter (Signed)
[facility] Triage Call Report Triage Record Num: [id number] Operator: Natasha Bence Patient Name: Jessica [name] Call Date & Time: 07/12/2013 8:07:09AM Patient Phone: [phone] PCP: Walker Kehr Patient Gender: Female PCP Fax : [phone] Patient DOB: March 08, 1934 Practice Name[facility] Reason for Call: Caller: [name]/Patient; PCP: Walker Kehr (Adults only); CB#: [phone]; Call regarding watery diarrhea that started on 4/4. Pt says on 4/4 she started having itching in her rectum and rectal bleeding so she used a Preperation H suppository. Pt states she has had several episodes of watery diarrhea daily since 4/4, but no signs of rectal bleeding since then. Pt also states she feels like she is extremely bloated. Triaged per Diarrhea or Other Change in Bowel Habits guideline. Needs to be seen within 4 hours for "Diarrhea associated with new abdominal bloating, distension or swelling". Appt made for 1130 on 4/11 with provider. Advised pt to drink plenty of fluids and call back if she worsens between now and appt time. Pt verbalized understanding. Protocol(s) Used: Diarrhea or Other Change in Bowel Habits Recommended Outcome per Protocol: See Provider within 4 hours Reason for Outcome: Diarrhea associated with new abdominal bloating, distension or swelling Care Advice: ~ Another adult should drive. ~ May drink clear liquids (such as water, tea) but do not eat solid foods prior to being seen by provider. ~ Call provider if symptoms worsen or new symptoms develop. Call EMS 911 if signs and symptoms of shock develop (such as unable to stand due to faintness, dizziness, or lightheadedness; new onset of confusion; slow to respond or difficult to awaken; skin is pale, gray, cool, or moist to touch; severe weakness; loss of consciousness). ~ ~ SYMPTOM / CONDITION MANAGEMENT ~ IMMEDIATE ACTION ~ CAUTIONS ~ List, or take, all current prescription(s), nonprescription or  alternative medication(s) to provider for evaluation. 04/

## 2013-07-14 NOTE — Telephone Encounter (Signed)
OV w/any MD pls THx

## 2013-07-15 NOTE — Telephone Encounter (Signed)
[facility].  She made an appt to come back in April 16.

## 2013-07-15 NOTE — Telephone Encounter (Signed)
LMOM to return call.

## 2013-07-17 ENCOUNTER — Encounter: Payer: Self-pay | Admitting: Internal Medicine

## 2013-07-17 ENCOUNTER — Ambulatory Visit (INDEPENDENT_AMBULATORY_CARE_PROVIDER_SITE_OTHER): Payer: Commercial Managed Care - HMO | Admitting: Internal Medicine

## 2013-07-17 VITALS — BP 130/82 | HR 80 | Temp 97.8°F | Resp 16 | Wt 252.0 lb

## 2013-07-17 DIAGNOSIS — F411 Generalized anxiety disorder: Secondary | ICD-10-CM

## 2013-07-17 DIAGNOSIS — M545 Low back pain, unspecified: Secondary | ICD-10-CM

## 2013-07-17 DIAGNOSIS — F329 Major depressive disorder, single episode, unspecified: Secondary | ICD-10-CM

## 2013-07-17 DIAGNOSIS — E119 Type 2 diabetes mellitus without complications: Secondary | ICD-10-CM

## 2013-07-17 DIAGNOSIS — E039 Hypothyroidism, unspecified: Secondary | ICD-10-CM

## 2013-07-17 DIAGNOSIS — I10 Essential (primary) hypertension: Secondary | ICD-10-CM

## 2013-07-17 DIAGNOSIS — F3289 Other specified depressive episodes: Secondary | ICD-10-CM

## 2013-07-17 DIAGNOSIS — R197 Diarrhea, unspecified: Secondary | ICD-10-CM

## 2013-07-17 MED ORDER — HYDROCODONE-ACETAMINOPHEN 7.5-325 MG PO TABS: 1.0000 | ORAL_TABLET | Freq: Four times a day (QID) | ORAL | Status: DC | PRN

## 2013-07-17 NOTE — Assessment & Plan Note (Signed)
Continue with current prescription therapy as reflected on the Med list.  

## 2013-07-17 NOTE — Progress Notes (Signed)
Pre visit review using our clinic review tool, if applicable. No additional management support is needed unless otherwise documented below in the visit note. 

## 2013-07-17 NOTE — Assessment & Plan Note (Signed)
Wt Readings from Last 3 Encounters:  07/17/13 252 lb (114.306 kg)  07/12/13 251 lb (113.853 kg)  05/15/13 255 lb (115.667 kg)  diet discussed

## 2013-07-17 NOTE — Assessment & Plan Note (Signed)
Improving.
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
## 2013-07-17 NOTE — Assessment & Plan Note (Signed)
Continue with current prescription therapy as reflected on the Med list. BP Readings from Last 3 Encounters:  07/17/13 130/82  07/12/13 136/80  05/15/13 138/78

## 2013-07-21 ENCOUNTER — Ambulatory Visit: Payer: Medicare HMO | Admitting: Internal Medicine

## 2013-07-24 ENCOUNTER — Other Ambulatory Visit: Payer: Self-pay | Admitting: *Deleted

## 2013-07-24 MED ORDER — METFORMIN HCL 1000 MG PO TABS: 1000.0000 mg | ORAL_TABLET | Freq: Two times a day (BID) | ORAL | Status: DC

## 2013-07-24 MED ORDER — TRIAMCINOLONE ACETONIDE 0.5 % EX OINT: 1.0000 "application " | TOPICAL_OINTMENT | Freq: Three times a day (TID) | CUTANEOUS | Status: DC

## 2013-07-24 MED ORDER — LOSARTAN POTASSIUM 100 MG PO TABS: 100.0000 mg | ORAL_TABLET | Freq: Every day | ORAL | Status: DC

## 2013-07-30 ENCOUNTER — Telehealth: Payer: Self-pay

## 2013-07-30 NOTE — Telephone Encounter (Signed)
Relevant patient education assigned to patient using Emmi. ° °

## 2013-08-04 ENCOUNTER — Ambulatory Visit (INDEPENDENT_AMBULATORY_CARE_PROVIDER_SITE_OTHER): Payer: Commercial Managed Care - HMO | Admitting: Internal Medicine

## 2013-08-04 ENCOUNTER — Encounter: Payer: Self-pay | Admitting: Internal Medicine

## 2013-08-04 ENCOUNTER — Ambulatory Visit: Payer: Medicare HMO

## 2013-08-04 VITALS — BP 130/76 | HR 76 | Temp 99.1°F | Resp 16 | Wt 249.0 lb

## 2013-08-04 DIAGNOSIS — R197 Diarrhea, unspecified: Secondary | ICD-10-CM

## 2013-08-04 DIAGNOSIS — I10 Essential (primary) hypertension: Secondary | ICD-10-CM

## 2013-08-04 DIAGNOSIS — E119 Type 2 diabetes mellitus without complications: Secondary | ICD-10-CM

## 2013-08-04 MED ORDER — ONDANSETRON HCL 4 MG PO TABS: 4.0000 mg | ORAL_TABLET | Freq: Three times a day (TID) | ORAL | Status: DC | PRN

## 2013-08-04 NOTE — Progress Notes (Signed)
Pre visit review using our clinic review tool, if applicable. No additional management support is needed unless otherwise documented below in the visit note. 

## 2013-08-04 NOTE — Assessment & Plan Note (Addendum)
Labs Hold Metformin if needed Blck stool is due to peptobismol

## 2013-08-04 NOTE — Patient Instructions (Signed)
Stop Nabumetone (Relafen) Use Zofran for nausea Hold Metformin if needed - it may cause nausea
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
## 2013-08-05 ENCOUNTER — Other Ambulatory Visit: Payer: Commercial Managed Care - HMO

## 2013-08-05 DIAGNOSIS — R197 Diarrhea, unspecified: Secondary | ICD-10-CM

## 2013-08-05 NOTE — Assessment & Plan Note (Signed)
Continue with current prescription therapy as reflected on the Med list.  

## 2013-08-05 NOTE — Assessment & Plan Note (Signed)
See below

## 2013-08-06 ENCOUNTER — Other Ambulatory Visit: Payer: Self-pay | Admitting: Internal Medicine

## 2013-08-06 ENCOUNTER — Telehealth: Payer: Self-pay | Admitting: *Deleted

## 2013-08-06 LAB — GIARDIA/CRYPTOSPORIDIUM (EIA)
Cryptosporidium Screen (EIA): NEGATIVE
GIARDIA SCREEN (EIA): NEGATIVE

## 2013-08-06 NOTE — Telephone Encounter (Signed)
Noted. Thx.

## 2013-08-06 NOTE — Telephone Encounter (Signed)
Per Cassie in [facility] lab: [facility] lab was not able to perform C-Diff EIA because stool specimen received was not the right consistency. FYI

## 2013-08-11 ENCOUNTER — Ambulatory Visit (INDEPENDENT_AMBULATORY_CARE_PROVIDER_SITE_OTHER)
Admission: RE | Admit: 2013-08-11 | Discharge: 2013-08-11 | Disposition: A | Discharge status: Home / Self Care | Payer: Commercial Managed Care - HMO | Source: Ambulatory Visit | Attending: Internal Medicine | Admitting: Internal Medicine

## 2013-08-11 ENCOUNTER — Ambulatory Visit (INDEPENDENT_AMBULATORY_CARE_PROVIDER_SITE_OTHER): Payer: Commercial Managed Care - HMO | Admitting: Internal Medicine

## 2013-08-11 ENCOUNTER — Encounter: Payer: Self-pay | Admitting: Internal Medicine

## 2013-08-11 VITALS — BP 102/72 | HR 76 | Temp 97.1°F | Resp 16 | Wt 247.0 lb

## 2013-08-11 DIAGNOSIS — R197 Diarrhea, unspecified: Secondary | ICD-10-CM

## 2013-08-11 DIAGNOSIS — M25519 Pain in unspecified shoulder: Secondary | ICD-10-CM

## 2013-08-11 DIAGNOSIS — E119 Type 2 diabetes mellitus without complications: Secondary | ICD-10-CM

## 2013-08-11 DIAGNOSIS — I10 Essential (primary) hypertension: Secondary | ICD-10-CM

## 2013-08-11 DIAGNOSIS — F411 Generalized anxiety disorder: Secondary | ICD-10-CM

## 2013-08-11 MED ORDER — METFORMIN HCL ER 500 MG PO TB24: 500.0000 mg | ORAL_TABLET | Freq: Every day | ORAL | Status: DC

## 2013-08-11 MED ORDER — HYDROCODONE-ACETAMINOPHEN 7.5-325 MG PO TABS: 1.0000 | ORAL_TABLET | Freq: Four times a day (QID) | ORAL | Status: DC | PRN

## 2013-08-11 NOTE — Assessment & Plan Note (Signed)
Continue with current prescription therapy as reflected on the Med list.  

## 2013-08-11 NOTE — Assessment & Plan Note (Signed)
Will change to Metformin ER Labs

## 2013-08-11 NOTE — Progress Notes (Signed)
Pre visit review using our clinic review tool, if applicable. No additional management support is needed unless otherwise documented below in the visit note. 

## 2013-08-11 NOTE — Assessment & Plan Note (Signed)
Continue with current prescription therapy as reflected on the Med list. BP Readings from Last 3 Encounters:  08/11/13 102/72  08/04/13 130/76  07/17/13 130/82
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
## 2013-08-11 NOTE — Assessment & Plan Note (Signed)
5/15 Resolved off Nabumetone

## 2013-08-11 NOTE — Assessment & Plan Note (Signed)
5/15 s/p fall Stretch Xray  Norco prn

## 2013-08-20 ENCOUNTER — Other Ambulatory Visit: Payer: Self-pay | Admitting: Internal Medicine

## 2013-08-27 ENCOUNTER — Encounter: Payer: Self-pay | Admitting: Internal Medicine

## 2013-08-27 ENCOUNTER — Telehealth: Payer: Self-pay | Admitting: Internal Medicine

## 2013-08-27 ENCOUNTER — Ambulatory Visit (INDEPENDENT_AMBULATORY_CARE_PROVIDER_SITE_OTHER): Payer: Commercial Managed Care - HMO | Admitting: *Deleted

## 2013-08-27 DIAGNOSIS — M25559 Pain in unspecified hip: Secondary | ICD-10-CM

## 2013-08-27 DIAGNOSIS — I441 Atrioventricular block, second degree: Secondary | ICD-10-CM

## 2013-08-27 LAB — MDC_IDC_ENUM_SESS_TYPE_REMOTE
Battery Impedance: 111 Ohm
Battery Remaining Longevity: 140 mo
Battery Voltage: 2.8 V
Brady Statistic AP VP Percent: 6 %
Brady Statistic AP VS Percent: 0 %
Brady Statistic AS VS Percent: 0 %
Date Time Interrogation Session: 20150527121303
Lead Channel Impedance Value: 752 Ohm
Lead Channel Pacing Threshold Amplitude: 0.375 V
Lead Channel Pacing Threshold Pulse Width: 0.4 ms
Lead Channel Pacing Threshold Pulse Width: 0.4 ms
Lead Channel Setting Pacing Amplitude: 2 V
Lead Channel Setting Pacing Amplitude: 2.5 V
Lead Channel Setting Sensing Sensitivity: 4 mV
MDC IDC MSMT LEADCHNL RA IMPEDANCE VALUE: 510 Ohm
MDC IDC MSMT LEADCHNL RA SENSING INTR AMPL: 2.8 mV
MDC IDC MSMT LEADCHNL RV PACING THRESHOLD AMPLITUDE: 0.375 V
MDC IDC SET LEADCHNL RV PACING PULSEWIDTH: 0.4 ms
MDC IDC STAT BRADY AS VP PERCENT: 93 %

## 2013-08-27 NOTE — Telephone Encounter (Signed)
Ok done Thx 

## 2013-08-27 NOTE — Progress Notes (Signed)
Remote pacemaker transmission.   

## 2013-08-27 NOTE — Telephone Encounter (Signed)
Pt called request referral for [facility] for Dr. Meridee Score for hip and right knee pain. Please advise.

## 2013-08-28 ENCOUNTER — Telehealth: Payer: Self-pay | Admitting: Internal Medicine

## 2013-08-28 NOTE — Telephone Encounter (Signed)
Caller: Jessica [name]/Patient; PCP: Plotnikov, Alex (Adults only); CB#: [phone]; Call regarding Leg pain; onset Sun 5/24. States her whole R leg is very sore but particularly the hip and knee joints. Describes it as very achy and sore from top of thigh to foot, rates 5/10, and has taken some hydrocodone over last few days to help with pain which she stated helps but doesn't relieve it and states she very rarely takes any pain meds. No injury. Denies any swelling or discoloration of the leg. No other sxs. Triaged per "Leg non-injury" protocol, and all emergent sxs r/o w/exception of "New onset mild to moderate pain that has not improved with 24 hrs of home care." Disp of see provider within 24 hrs. States she has appt already at 10:30am this morning.

## 2013-08-28 NOTE — Telephone Encounter (Signed)
Patient Information:  Caller Name: Millee  Phone: [phone]  Patient: Marquasia, Schmieder  Gender: Female  DOB: Aug 06, 1933  Age: 80 Years  PCP: Plotnikov, Alex (Adults only)  Office Follow Up:  Does the office need to follow up with this patient?: No  Instructions For The Office: N/A   Symptoms  Reason For Call & Symptoms: Pt calling, R hip into R leg pain and knee pain, wants to see if referral was made 08/27/13 to orthopedic, Dr. Sharol Given.  Pt says she called 08/27/13 and not heard yet.  RN checked in Epic, order in place for referral.  Pt to call Dr. Sharol Given office for appt and confirm referral.  Pt voiced understanding.  Reviewed Health History In EMR: Yes  Reviewed Medications In EMR: Yes  Reviewed Allergies In EMR: Yes  Reviewed Surgeries / Procedures: Yes  Date of Onset of Symptoms: Unknown  Guideline(s) Used:  No Protocol Available - Information Only  Disposition Per Guideline:   Home Care  Reason For Disposition Reached:   Information only question and nurse able to answer  Advice Given:  N/A  Patient Will Follow Care Advice:  YES

## 2013-09-12 ENCOUNTER — Encounter: Payer: Self-pay | Admitting: Cardiology

## 2013-09-16 ENCOUNTER — Ambulatory Visit: Payer: Commercial Managed Care - HMO | Admitting: Internal Medicine

## 2013-09-22 ENCOUNTER — Other Ambulatory Visit: Payer: Self-pay | Admitting: *Deleted

## 2013-09-22 MED ORDER — LEVOTHYROXINE SODIUM 75 MCG PO TABS: 75.0000 ug | ORAL_TABLET | Freq: Every day | ORAL | Status: DC

## 2013-09-22 NOTE — Telephone Encounter (Signed)
Left msg on triage she is out of her thyroid medicine needing med sent her new mail service. Called pt back to verify mail service. Pt use [facility]. Inform pt will send also will [facility] in 2 week supply [facility].../lmb

## 2013-09-25 ENCOUNTER — Telehealth: Payer: Self-pay | Admitting: *Deleted

## 2013-09-25 ENCOUNTER — Other Ambulatory Visit: Payer: Self-pay | Admitting: *Deleted

## 2013-09-25 MED ORDER — FUROSEMIDE 20 MG PO TABS: 20.0000 mg | ORAL_TABLET | Freq: Every day | ORAL | Status: DC

## 2013-09-25 NOTE — Telephone Encounter (Signed)
Rf req temazepam 15 mg 90 day supply. Ok to Rf?

## 2013-09-25 NOTE — Telephone Encounter (Signed)
Pt is needing new rx on her furosemide sent to her new mail service [facility]. Inform pt we will send...Jessica [name]

## 2013-09-26 MED ORDER — TEMAZEPAM 15 MG PO CAPS: 15.0000 mg | ORAL_CAPSULE | Freq: Every evening | ORAL | Status: DC | PRN

## 2013-09-26 NOTE — Telephone Encounter (Signed)
Rx faxed to [facility].

## 2013-09-26 NOTE — Telephone Encounter (Signed)
OK to fill this prescription with additional refills x1 Thank you!  

## 2013-10-13 ENCOUNTER — Ambulatory Visit (INDEPENDENT_AMBULATORY_CARE_PROVIDER_SITE_OTHER): Payer: Commercial Managed Care - HMO | Admitting: Internal Medicine

## 2013-10-13 ENCOUNTER — Encounter: Payer: Self-pay | Admitting: Internal Medicine

## 2013-10-13 VITALS — BP 110/68 | HR 76 | Temp 98.2°F | Resp 16 | Wt 247.0 lb

## 2013-10-13 DIAGNOSIS — E119 Type 2 diabetes mellitus without complications: Secondary | ICD-10-CM

## 2013-10-13 DIAGNOSIS — E039 Hypothyroidism, unspecified: Secondary | ICD-10-CM

## 2013-10-13 DIAGNOSIS — R21 Rash and other nonspecific skin eruption: Secondary | ICD-10-CM

## 2013-10-13 DIAGNOSIS — R609 Edema, unspecified: Secondary | ICD-10-CM

## 2013-10-13 DIAGNOSIS — L299 Pruritus, unspecified: Secondary | ICD-10-CM

## 2013-10-13 MED ORDER — FUROSEMIDE 20 MG PO TABS: 20.0000 mg | ORAL_TABLET | Freq: Every day | ORAL | Status: DC

## 2013-10-13 MED ORDER — TRIAMCINOLONE ACETONIDE 0.5 % EX OINT: TOPICAL_OINTMENT | CUTANEOUS | Status: DC

## 2013-10-13 MED ORDER — METHYLPREDNISOLONE ACETATE 80 MG/ML IJ SUSP
80.0000 mg | Freq: Once | INTRAMUSCULAR | Status: AC
  Administered 2013-10-13: 80 mg via INTRAMUSCULAR

## 2013-10-13 NOTE — Patient Instructions (Addendum)
Stop Triamterene/HCTZ (Maxzide)  Stop Allopurinol in 1-2 weeks if the rash is not better  Benadryl 25-50 mg 4 times a day as needed for itching

## 2013-10-13 NOTE — Progress Notes (Signed)
Pre visit review using our clinic review tool, if applicable. No additional management support is needed unless otherwise documented below in the visit note. 

## 2013-10-28 ENCOUNTER — Other Ambulatory Visit: Payer: Self-pay | Admitting: *Deleted

## 2013-10-28 MED ORDER — HYDROCODONE-ACETAMINOPHEN 7.5-325 MG PO TABS: 1.0000 | ORAL_TABLET | Freq: Four times a day (QID) | ORAL | Status: DC | PRN

## 2013-10-28 NOTE — Telephone Encounter (Signed)
Left msg on triage requesting refill on her hydrocodone. MD out of office pls advise...Jessica [name]

## 2013-10-28 NOTE — Telephone Encounter (Signed)
NOTIFIED PT RX READY FOR PICK-UP...Jessica [name]

## 2013-11-13 ENCOUNTER — Encounter: Payer: Self-pay | Admitting: Internal Medicine

## 2013-11-13 ENCOUNTER — Other Ambulatory Visit (INDEPENDENT_AMBULATORY_CARE_PROVIDER_SITE_OTHER): Payer: Commercial Managed Care - HMO

## 2013-11-13 ENCOUNTER — Ambulatory Visit (INDEPENDENT_AMBULATORY_CARE_PROVIDER_SITE_OTHER): Payer: Commercial Managed Care - HMO | Admitting: Internal Medicine

## 2013-11-13 VITALS — BP 120/70 | HR 72 | Temp 98.2°F | Resp 16 | Wt 248.0 lb

## 2013-11-13 DIAGNOSIS — E119 Type 2 diabetes mellitus without complications: Secondary | ICD-10-CM

## 2013-11-13 DIAGNOSIS — I10 Essential (primary) hypertension: Secondary | ICD-10-CM

## 2013-11-13 DIAGNOSIS — F329 Major depressive disorder, single episode, unspecified: Secondary | ICD-10-CM

## 2013-11-13 DIAGNOSIS — R0989 Other specified symptoms and signs involving the circulatory and respiratory systems: Secondary | ICD-10-CM

## 2013-11-13 DIAGNOSIS — F3289 Other specified depressive episodes: Secondary | ICD-10-CM

## 2013-11-13 DIAGNOSIS — R0609 Other forms of dyspnea: Secondary | ICD-10-CM

## 2013-11-13 DIAGNOSIS — E039 Hypothyroidism, unspecified: Secondary | ICD-10-CM

## 2013-11-13 DIAGNOSIS — M545 Low back pain, unspecified: Secondary | ICD-10-CM

## 2013-11-13 DIAGNOSIS — R0683 Snoring: Secondary | ICD-10-CM

## 2013-11-13 DIAGNOSIS — R29898 Other symptoms and signs involving the musculoskeletal system: Secondary | ICD-10-CM

## 2013-11-13 LAB — BASIC METABOLIC PANEL
BUN: 16 mg/dL (ref 6–23)
CO2: 30 mEq/L (ref 19–32)
Calcium: 10.2 mg/dL (ref 8.4–10.5)
Chloride: 100 mEq/L (ref 96–112)
Creatinine, Ser: 0.7 mg/dL (ref 0.4–1.2)
GFR: 85.98 mL/min (ref >60.00)
Glucose, Bld: 113 mg/dL — ABNORMAL HIGH (ref 70–99)
POTASSIUM: 4 meq/L (ref 3.5–5.1)
SODIUM: 138 meq/L (ref 135–145)

## 2013-11-13 LAB — HEMOGLOBIN A1C: Hgb A1c MFr Bld: 7.1 % — ABNORMAL HIGH (ref 4.6–6.5)

## 2013-11-13 NOTE — Assessment & Plan Note (Signed)
Pt declined a sleep test 

## 2013-11-13 NOTE — Assessment & Plan Note (Signed)
Labs  Continue with current prescription therapy as reflected on the Med list.  

## 2013-11-13 NOTE — Assessment & Plan Note (Signed)
OA, obesity related

## 2013-11-13 NOTE — Assessment & Plan Note (Signed)
Continue with current prescription therapy as reflected on the Med list.  
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
## 2013-11-13 NOTE — Progress Notes (Deleted)
Pre visit review using our clinic review tool, if applicable. No additional management support is needed unless otherwise documented below in the visit note. 

## 2013-11-14 ENCOUNTER — Encounter: Payer: Self-pay | Admitting: Gastroenterology

## 2013-11-16 NOTE — Assessment & Plan Note (Signed)
Diet discussed 

## 2013-11-16 NOTE — Progress Notes (Signed)
Subjective:     HPI  The patient presents for a follow-up of  chronic hypertension, chronic dyslipidemia, OA controlled with medicines, LBP  BP Readings from Last 3 Encounters:  11/13/13 120/70  10/13/13 110/68  08/11/13 102/72   Wt Readings from Last 3 Encounters:  11/13/13 248 lb (112.492 kg)  10/13/13 247 lb (112.038 kg)  08/11/13 247 lb (112.038 kg)     Review of Systems  Constitutional: Negative for chills, activity change, appetite change, fatigue and unexpected weight change.  HENT: Negative for congestion, mouth sores and sinus pressure.   Eyes: Negative for visual disturbance.  Respiratory: Negative for cough and chest tightness.   Gastrointestinal: Negative for nausea and abdominal pain.  Genitourinary: Negative for frequency, difficulty urinating and vaginal pain.  Musculoskeletal: Negative for back pain and gait problem.  Skin: Negative for pallor and rash.  Neurological: Negative for dizziness, tremors, weakness, numbness and headaches.  Psychiatric/Behavioral: Negative for suicidal ideas, confusion and sleep disturbance.       Objective:   Physical Exam  Constitutional: She appears well-developed. No distress.  Obese   HENT:  Head: Normocephalic.  Right Ear: External ear normal.  Left Ear: External ear normal.  Nose: Nose normal.  Mouth/Throat: Oropharynx is clear and moist.  Eyes: Conjunctivae are normal. Pupils are equal, round, and reactive to light. Right eye exhibits no discharge. Left eye exhibits no discharge.  Neck: Normal range of motion. Neck supple. No JVD present. No tracheal deviation present. No thyromegaly present.  Cardiovascular: Normal rate, regular rhythm and normal heart sounds.   Pulmonary/Chest: No stridor. No respiratory distress. She has no wheezes.  Abdominal: Soft. Bowel sounds are normal. She exhibits no distension and no mass. There is no tenderness. There is no rebound and no guarding.  Musculoskeletal: She exhibits no edema  and no tenderness.  Lymphadenopathy:    She has no cervical adenopathy.  Neurological: She displays normal reflexes. No cranial nerve deficit. She exhibits normal muscle tone. Coordination normal.  Skin: No rash noted. No erythema.  Psychiatric: She has a normal mood and affect. Her behavior is normal. Judgment and thought content normal.   Lab Results  Component Value Date   WBC 8.1 02/26/2013   HGB 13.7 02/26/2013   HCT 40.8 02/26/2013   PLT 371.0 02/26/2013   GLUCOSE 113* 11/13/2013   CHOL 210* 10/10/2012   TRIG 289* 10/10/2012   HDL 39* 10/10/2012   LDLDIRECT 155.9 09/30/2012   LDLCALC 113* 10/10/2012   ALT 47* 02/06/2013   AST 45* 02/06/2013   NA 138 11/13/2013   K 4.0 11/13/2013   CL 100 11/13/2013   CREATININE 0.7 11/13/2013   BUN 16 11/13/2013   CO2 30 11/13/2013   TSH 3.867 10/09/2012   INR 3.3 08/26/2009   HGBA1C 7.1* 11/13/2013   MICROALBUR 4.5* 12/02/2009       Assessment:         Plan:

## 2013-11-16 NOTE — Assessment & Plan Note (Signed)
Pls loose wt

## 2013-11-16 NOTE — Assessment & Plan Note (Signed)
Continue with current prescription therapy as reflected on the Med list.  

## 2013-11-18 ENCOUNTER — Encounter: Payer: Self-pay | Admitting: Internal Medicine

## 2013-11-28 ENCOUNTER — Other Ambulatory Visit: Payer: Self-pay | Admitting: Internal Medicine

## 2013-12-21 ENCOUNTER — Encounter (HOSPITAL_COMMUNITY): Payer: Self-pay | Admitting: Emergency Medicine

## 2013-12-21 ENCOUNTER — Emergency Department (HOSPITAL_COMMUNITY)
Admission: EM | Admit: 2013-12-21 | Discharge: 2013-12-21 | Discharge status: Against Medical Advice | Payer: Medicare PPO | Attending: Emergency Medicine | Admitting: Emergency Medicine

## 2013-12-21 DIAGNOSIS — R609 Edema, unspecified: Secondary | ICD-10-CM | POA: Diagnosis not present

## 2013-12-21 DIAGNOSIS — M7989 Other specified soft tissue disorders: Secondary | ICD-10-CM

## 2013-12-21 DIAGNOSIS — I10 Essential (primary) hypertension: Secondary | ICD-10-CM | POA: Insufficient documentation

## 2013-12-21 DIAGNOSIS — E119 Type 2 diabetes mellitus without complications: Secondary | ICD-10-CM | POA: Insufficient documentation

## 2013-12-21 DIAGNOSIS — M79609 Pain in unspecified limb: Secondary | ICD-10-CM

## 2013-12-21 DIAGNOSIS — E669 Obesity, unspecified: Secondary | ICD-10-CM | POA: Insufficient documentation

## 2013-12-21 NOTE — Progress Notes (Signed)
VASCULAR LAB PRELIMINARY  PRELIMINARY  PRELIMINARY  PRELIMINARY  Left lower extremity venous duplex completed.    Preliminary report:  Left:  No evidence of DVT, superficial thrombosis, or Baker's cyst.  Jessica [name], RVS 12/21/2013, 2:08 PM

## 2013-12-21 NOTE — ED Notes (Signed)
Pt sts she did not want to wait to be seen

## 2013-12-21 NOTE — ED Notes (Signed)
Reports waking up on Friday morning with red bump to top of left foot. Pt went to ucc yesterday and they measured the swelling to her left lower leg and told her to return today, did not start her on antibiotics. They sent her here today to r/o blood clot. Pt has redness and swelling to left foot and lower leg. Denies any fever or chills.

## 2013-12-22 ENCOUNTER — Encounter: Payer: Self-pay | Admitting: Internal Medicine

## 2013-12-22 ENCOUNTER — Ambulatory Visit (INDEPENDENT_AMBULATORY_CARE_PROVIDER_SITE_OTHER): Payer: Commercial Managed Care - HMO | Admitting: Internal Medicine

## 2013-12-22 VITALS — BP 118/78 | HR 73 | Temp 98.4°F | Wt 243.0 lb

## 2013-12-22 DIAGNOSIS — L03119 Cellulitis of unspecified part of limb: Secondary | ICD-10-CM

## 2013-12-22 DIAGNOSIS — L02612 Cutaneous abscess of left foot: Secondary | ICD-10-CM | POA: Insufficient documentation

## 2013-12-22 DIAGNOSIS — M545 Low back pain, unspecified: Secondary | ICD-10-CM

## 2013-12-22 DIAGNOSIS — L02619 Cutaneous abscess of unspecified foot: Secondary | ICD-10-CM

## 2013-12-22 MED ORDER — CEPHALEXIN 500 MG PO CAPS: 500.0000 mg | ORAL_CAPSULE | Freq: Four times a day (QID) | ORAL | Status: DC

## 2013-12-22 MED ORDER — HYDROCODONE-ACETAMINOPHEN 7.5-325 MG PO TABS: 1.0000 | ORAL_TABLET | Freq: Four times a day (QID) | ORAL | Status: DC | PRN

## 2013-12-22 NOTE — Assessment & Plan Note (Signed)
Hydrocodone/APAP prn  Chronic - worse  Potential benefits of a long term opioids use as well as potential risks (i.e. addiction risk, apnea etc) and complications (i.e. Somnolence, constipation and others) were explained to the patient and were aknowledged.

## 2013-12-22 NOTE — Assessment & Plan Note (Signed)
9/15  [place] to confirm abscess cavity presence I&D Keflex

## 2013-12-22 NOTE — Progress Notes (Signed)
Patient ID: Jessica [name], female   DOB: 09/20/1933, 80 y.o.   MRN: [id number]   Subjective:    HPI  C/o L foot pain and swelling x 3 days  She was seen at [facility] and had a ven doppler US - no clot    S/p pacemaker placement F/u grief - son and brother - both died The patient presents for a follow-up of  chronic hypertension, chronic dyslipidemia, type 2 diabetes controlled with medicines. C/o LBP. F/u L foot pain - chronic, better.   Wt Readings from Last 3 Encounters:  12/22/13 243 lb (110.224 kg)  12/21/13 247 lb (112.038 kg)  11/13/13 248 lb (112.492 kg)   BP Readings from Last 3 Encounters:  12/22/13 118/78  12/21/13 146/65  11/13/13 120/70      Review of Systems  Constitutional: Negative for chills, activity change, appetite change, fatigue and unexpected weight change.  HENT: Negative for congestion, mouth sores and sinus pressure.   Eyes: Negative for visual disturbance.  Respiratory: Negative for cough and chest tightness.   Gastrointestinal: Negative for nausea and abdominal pain.  Genitourinary: Negative for frequency, difficulty urinating and vaginal pain.  Musculoskeletal: Positive for arthralgias, back pain and gait problem.  Skin: Negative for pallor and rash.  Neurological: Negative for dizziness, tremors, weakness, numbness and headaches.  Psychiatric/Behavioral: Positive for sleep disturbance. Negative for suicidal ideas, confusion and dysphoric mood. The patient is nervous/anxious.        Objective:   Physical Exam  Constitutional: She appears well-developed. No distress.  Obese   HENT:  Head: Normocephalic.  Right Ear: External ear normal.  Left Ear: External ear normal.  Nose: Nose normal.  Mouth/Throat: Oropharynx is clear and moist.  Eyes: Conjunctivae are normal. Pupils are equal, round, and reactive to light. Right eye exhibits no discharge. Left eye exhibits no discharge.  Neck: Normal range of motion. Neck supple. No JVD present. No  tracheal deviation present. No thyromegaly present.  Cardiovascular: Normal rate, regular rhythm and normal heart sounds.   Pulmonary/Chest: No stridor. No respiratory distress. She has no wheezes.  Abdominal: Soft. Bowel sounds are normal. She exhibits no distension and no mass. There is no tenderness. There is no rebound and no guarding.  Musculoskeletal: She exhibits no edema and no tenderness.  Lymphadenopathy:    She has no cervical adenopathy.  Neurological: She displays normal reflexes. No cranial nerve deficit. She exhibits normal muscle tone. Coordination normal.  Skin: No rash noted. No erythema.  Psychiatric: She has a normal mood and affect. Her behavior is normal. Judgment and thought content normal.  L dorsal foot 1 cm raised nodule w/a 3 cm erythema  Lab Results  Component Value Date   WBC 8.1 02/26/2013   HGB 13.7 02/26/2013   HCT 40.8 02/26/2013   PLT 371.0 02/26/2013   GLUCOSE 113* 11/13/2013   CHOL 210* 10/10/2012   TRIG 289* 10/10/2012   HDL 39* 10/10/2012   LDLDIRECT 155.9 09/30/2012   LDLCALC 113* 10/10/2012   ALT 47* 02/06/2013   AST 45* 02/06/2013   NA 138 11/13/2013   K 4.0 11/13/2013   CL 100 11/13/2013   CREATININE 0.7 11/13/2013   BUN 16 11/13/2013   CO2 30 11/13/2013   TSH 3.867 10/09/2012   INR 3.3 08/26/2009   HGBA1C 7.1* 11/13/2013   MICROALBUR 4.5* 12/02/2009    Procedure Note :    Procedure :   Point of care (POC) sonography examination   Indication: L foot swelling, dorsal  Equipment used: Sonosite M-Turbo with HFL38x/13-6 MHz transducer linear probe. The images were stored in the unit and later transferred in storage.  The patient was placed in a decubitus position.  This study revealed a hypoechoic   <1 cm lesion in   Impression: L dorsal foot fluid collection c/w an ascess   Procedure note:  Incision and Drainage of an Abscess   Indication : a localized collection of pus that is tender and not spontaneously resolving.    Risks including  unsuccessful procedure , possible need for a repeat procedure due to pus accumulation, scar formation, and others as well as benefits were explained to the patient in detail. Written consent was obtained/signed.    The patient was placed in a decubitus position. The area of an abscess was prepped with povidone-iodine and draped in a sterile fashion. Local anesthesia with 1 cc of 2% lidocaine and epinephrine  was administered. 0.5 cm incision with #11 strait blade was made. About 0.5 cc of purulent material was expressed. The abscess cavity was explored with a sterile hemostat and the walled- off pockets and septae were broken down bluntly. The cavity was irrigated with the rest of the anesthetic in the syringe.   The wound was dressed with antibiotic ointment and Telfa pad.  Tolerated well. Complications: None.   Wound instructions provided.     Assessment & Plan:

## 2013-12-22 NOTE — Progress Notes (Signed)
Pre visit review using our clinic review tool, if applicable. No additional management support is needed unless otherwise documented below in the visit note. 

## 2013-12-22 NOTE — Patient Instructions (Signed)
Take Aleve 2 tabs twice a day for 2 days with food

## 2013-12-23 ENCOUNTER — Other Ambulatory Visit: Payer: Self-pay

## 2013-12-24 ENCOUNTER — Ambulatory Visit (INDEPENDENT_AMBULATORY_CARE_PROVIDER_SITE_OTHER): Payer: Commercial Managed Care - HMO | Admitting: Internal Medicine

## 2013-12-24 ENCOUNTER — Encounter: Payer: Self-pay | Admitting: Internal Medicine

## 2013-12-24 VITALS — BP 130/70 | HR 76 | Ht 62.0 in | Wt 245.2 lb

## 2013-12-24 DIAGNOSIS — I4891 Unspecified atrial fibrillation: Secondary | ICD-10-CM

## 2013-12-24 DIAGNOSIS — I441 Atrioventricular block, second degree: Secondary | ICD-10-CM

## 2013-12-24 DIAGNOSIS — I10 Essential (primary) hypertension: Secondary | ICD-10-CM

## 2013-12-24 DIAGNOSIS — I480 Paroxysmal atrial fibrillation: Secondary | ICD-10-CM

## 2013-12-24 LAB — MDC_IDC_ENUM_SESS_TYPE_INCLINIC
Battery Impedance: 111 Ohm
Battery Remaining Longevity: 142 mo
Brady Statistic AP VP Percent: 5 %
Brady Statistic AS VS Percent: 0 %
Lead Channel Impedance Value: 518 Ohm
Lead Channel Impedance Value: 838 Ohm
Lead Channel Pacing Threshold Pulse Width: 0.4 ms
Lead Channel Sensing Intrinsic Amplitude: 0.35 mV
Lead Channel Sensing Intrinsic Amplitude: 8 mV
Lead Channel Setting Pacing Amplitude: 2 V
Lead Channel Setting Pacing Pulse Width: 0.4 ms
Lead Channel Setting Sensing Sensitivity: 4 mV
MDC IDC MSMT BATTERY VOLTAGE: 2.8 V
MDC IDC MSMT LEADCHNL RA PACING THRESHOLD AMPLITUDE: 0.5 V
MDC IDC MSMT LEADCHNL RV PACING THRESHOLD AMPLITUDE: 0.5 V
MDC IDC MSMT LEADCHNL RV PACING THRESHOLD PULSEWIDTH: 0.4 ms
MDC IDC SESS DTM: 20150923144022
MDC IDC SET LEADCHNL RV PACING AMPLITUDE: 2.5 V
MDC IDC STAT BRADY AP VS PERCENT: 0 %
MDC IDC STAT BRADY AS VP PERCENT: 95 %
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
## 2013-12-24 NOTE — Patient Instructions (Addendum)
Your physician wants you to follow-up in: 12 months with Dr Allred You will receive a reminder letter in the mail two months in advance. If you don't receive a letter, please call our office to schedule the follow-up appointment.    Remote monitoring is used to monitor your Pacemaker or ICD from home. This monitoring reduces the number of office visits required to check your device to one time per year. It allows us to keep an eye on the functioning of your device to ensure it is working properly. You are scheduled for a device check from home on 03/30/14. You may send your transmission at any time that day. If you have a wireless device, the transmission will be sent automatically. After your physician reviews your transmission, you will receive a postcard with your next transmission date.  Thank you for choosing [facility]!!     Sonyia Muro, RN [phone]    

## 2013-12-29 ENCOUNTER — Encounter: Payer: Self-pay | Admitting: Internal Medicine

## 2013-12-31 ENCOUNTER — Telehealth: Payer: Self-pay | Admitting: Internal Medicine

## 2013-12-31 NOTE — Telephone Encounter (Signed)
Patient Information:  Caller Name: Alahia  Phone: [phone]  Patient: Jessica [name]  Gender: Female  DOB: [birth year]  Age: 80 Years  PCP: Plotnikov, Alex (Adults only)  Office Follow Up:  Does the office need to follow up with this patient?: No  Instructions For The Office: N/A  RN Note:  Pt. states she is not going to the ED. Was just hospitalized recently. Advised the pt. to increase her fluids and eat bananas, If pt. is not feeling better will need to call the office and go in for a Potassium check. Pt. advised not to increase her meds on her own again.  Symptoms  Reason For Call & Symptoms: Martin Majestic out to the store. Started to feel weak. Bp 132/71. P57. After 39min., took it again and it was 101. Pt. admits she has been taking her Lasix BID instead of once a day because of some swelling. No palpitations. Wonders if the battery in her monitor is weak. Nurse in the neighborhood took her pulse and it was 60. No chest pain. No difficulty breathing.  Reviewed Health History In EMR: Yes  Reviewed Medications In EMR: Yes  Reviewed Allergies In EMR: Yes  Reviewed Surgeries / Procedures: Yes  Date of Onset of Symptoms: 12/31/2013  Guideline(s) Used:  Heart Rate and Heartbeat Questions  Disposition Per Guideline:   See Today in Office  Reason For Disposition Reached:   Taking water pill (i.e., diuretic) or heart medication (e.g., digoxin)  Advice Given:  Health Basics  Liquid Intake: Drink adequate liquids, 6-8 glasses of water daily.  Call Back If:  Chest pain, lightheadedness, or difficulty breathing occurs  Heart beating more than 130 beats / minute  You become worse.  Patient Will Follow Care Advice:  YES

## 2014-01-13 ENCOUNTER — Telehealth: Payer: Self-pay | Admitting: *Deleted

## 2014-01-13 NOTE — Telephone Encounter (Signed)
Rf request for Lorazepam 0.5 mg. Ok to Rf?

## 2014-01-15 MED ORDER — LORAZEPAM 0.5 MG PO TABS: 0.5000 mg | ORAL_TABLET | Freq: Two times a day (BID) | ORAL | Status: DC | PRN

## 2014-01-15 NOTE — Telephone Encounter (Signed)
Rx printed/signed/faxed

## 2014-01-15 NOTE — Telephone Encounter (Signed)
OK to fill this prescription with additional refills x2 Thank you!  

## 2014-02-13 ENCOUNTER — Ambulatory Visit: Payer: Commercial Managed Care - HMO | Admitting: Internal Medicine

## 2014-02-18 ENCOUNTER — Other Ambulatory Visit (INDEPENDENT_AMBULATORY_CARE_PROVIDER_SITE_OTHER): Payer: Medicare PPO

## 2014-02-18 DIAGNOSIS — I10 Essential (primary) hypertension: Secondary | ICD-10-CM

## 2014-02-18 DIAGNOSIS — E119 Type 2 diabetes mellitus without complications: Secondary | ICD-10-CM

## 2014-02-18 LAB — BASIC METABOLIC PANEL
BUN: 19 mg/dL (ref 6–23)
CALCIUM: 9.8 mg/dL (ref 8.4–10.5)
CO2: 24 mEq/L (ref 19–32)
Chloride: 105 mEq/L (ref 96–112)
Creatinine, Ser: 0.8 mg/dL (ref 0.4–1.2)
GFR: 74.73 mL/min (ref >60.00)
GLUCOSE: 120 mg/dL — AB (ref 70–99)
Potassium: 4 mEq/L (ref 3.5–5.1)
SODIUM: 140 meq/L (ref 135–145)

## 2014-02-18 LAB — HEMOGLOBIN A1C: HEMOGLOBIN A1C: 7 % — AB (ref 4.6–6.5)

## 2014-02-25 ENCOUNTER — Encounter: Payer: Self-pay | Admitting: Internal Medicine

## 2014-02-25 ENCOUNTER — Ambulatory Visit (INDEPENDENT_AMBULATORY_CARE_PROVIDER_SITE_OTHER): Payer: Commercial Managed Care - HMO | Admitting: Internal Medicine

## 2014-02-25 VITALS — BP 140/84 | HR 77 | Temp 98.6°F | Wt 251.0 lb

## 2014-02-25 DIAGNOSIS — E118 Type 2 diabetes mellitus with unspecified complications: Secondary | ICD-10-CM

## 2014-02-25 DIAGNOSIS — I872 Venous insufficiency (chronic) (peripheral): Secondary | ICD-10-CM | POA: Insufficient documentation

## 2014-02-25 DIAGNOSIS — I8312 Varicose veins of left lower extremity with inflammation: Secondary | ICD-10-CM

## 2014-02-25 DIAGNOSIS — I10 Essential (primary) hypertension: Secondary | ICD-10-CM

## 2014-02-25 MED ORDER — HYDROCODONE-ACETAMINOPHEN 7.5-325 MG PO TABS: 1.0000 | ORAL_TABLET | Freq: Four times a day (QID) | ORAL | Status: DC | PRN

## 2014-02-25 MED ORDER — TRIAMCINOLONE ACETONIDE 0.5 % EX OINT: TOPICAL_OINTMENT | CUTANEOUS | Status: DC

## 2014-02-25 NOTE — Assessment & Plan Note (Signed)
Continue with current prescription therapy as reflected on the Med list.  

## 2014-02-25 NOTE — Assessment & Plan Note (Signed)
11/15 LLE - inner dist shin forming ulcer Loose weight Elevate legs Compression knee highs

## 2014-02-25 NOTE — Progress Notes (Signed)
Patient ID: Jessica [name], female   DOB: 09-Feb-1934, 80 y.o.   MRN: [id number]   Subjective:    HPI  C/o L foot and L inner shin pain, rash and swelling - worse She was seen at [facility] and had a ven doppler US - no clot    S/p pacemaker placement F/u grief - son and brother - both died The patient presents for a follow-up of  chronic hypertension, chronic dyslipidemia, type 2 diabetes controlled with medicines. C/o LBP. F/u L foot pain - chronic, better.   Wt Readings from Last 3 Encounters:  02/25/14 251 lb (113.853 kg)  12/24/13 245 lb 3.2 oz (111.222 kg)  12/22/13 243 lb (110.224 kg)   BP Readings from Last 3 Encounters:  02/25/14 140/84  12/24/13 130/70  12/22/13 118/78      Review of Systems  Constitutional: Negative for chills, activity change, appetite change, fatigue and unexpected weight change.  HENT: Negative for congestion, mouth sores and sinus pressure.   Eyes: Negative for visual disturbance.  Respiratory: Negative for cough and chest tightness.   Cardiovascular: Positive for leg swelling.  Gastrointestinal: Negative for nausea and abdominal pain.  Genitourinary: Negative for frequency, difficulty urinating and vaginal pain.  Musculoskeletal: Positive for back pain, arthralgias and gait problem.  Skin: Positive for rash. Negative for pallor.  Neurological: Negative for dizziness, tremors, weakness, numbness and headaches.  Psychiatric/Behavioral: Positive for sleep disturbance. Negative for suicidal ideas, confusion and dysphoric mood. The patient is nervous/anxious.        Objective:   Physical Exam  Constitutional: She appears well-developed. No distress.  HENT:  Head: Normocephalic.  Right Ear: External ear normal.  Left Ear: External ear normal.  Nose: Nose normal.  Mouth/Throat: Oropharynx is clear and moist.  Eyes: Conjunctivae are normal. Pupils are equal, round, and reactive to light. Right eye exhibits no discharge. Left eye exhibits no  discharge.  Neck: Normal range of motion. Neck supple. No JVD present. No tracheal deviation present. No thyromegaly present.  Cardiovascular: Normal rate, regular rhythm and normal heart sounds.   Pulmonary/Chest: No stridor. No respiratory distress. She has no wheezes.  Abdominal: Soft. Bowel sounds are normal. She exhibits no distension and no mass. There is no tenderness. There is no rebound and no guarding.  Musculoskeletal: She exhibits edema and tenderness.  Lymphadenopathy:    She has no cervical adenopathy.  Neurological: She displays normal reflexes. No cranial nerve deficit. She exhibits normal muscle tone. Coordination normal.  Skin: No rash noted. There is erythema.  Psychiatric: She has a normal mood and affect. Her behavior is normal. Judgment and thought content normal.  L dorsal foot  w/a 3 cm erythema L dist inner shin w/dark discoloration and a forming ulcer - tender Edema 1+ L and trace R  Lab Results  Component Value Date   WBC 8.1 02/26/2013   HGB 13.7 02/26/2013   HCT 40.8 02/26/2013   PLT 371.0 02/26/2013   GLUCOSE 120* 02/18/2014   CHOL 210* 10/10/2012   TRIG 289* 10/10/2012   HDL 39* 10/10/2012   LDLDIRECT 155.9 09/30/2012   LDLCALC 113* 10/10/2012   ALT 47* 02/06/2013   AST 45* 02/06/2013   NA 140 02/18/2014   K 4.0 02/18/2014   CL 105 02/18/2014   CREATININE 0.8 02/18/2014   BUN 19 02/18/2014   CO2 24 02/18/2014   TSH 3.867 10/09/2012   INR 3.3 08/26/2009   HGBA1C 7.0* 02/18/2014   MICROALBUR 4.5* 12/02/2009  Assessment & Plan:  Patient ID: Jessica [name], female   DOB: 1933/09/07, 80 y.o.   MRN: [id number]

## 2014-02-25 NOTE — Progress Notes (Signed)
Pre visit review using our clinic review tool, if applicable. No additional management support is needed unless otherwise documented below in the visit note. 

## 2014-02-25 NOTE — Patient Instructions (Signed)
Loose weight Elevate leg Compression knee highs    Stasis Ulcer Stasis ulcers occur in the legs when the circulation is damaged. An ulcer may look like a small hole in the skin.  CAUSES Stasis ulcers occur because your veins do not work properly. Veins have valves that help the blood return to the heart. If these valves do not work right, blood flows backwards and backs up into the veins near the skin. This condition causes the veins to become larger because of increased pressure and may lead to a stasis ulcer. SYMPTOMS   Shallow (superficial) sore on the leg.  Clear drainage or weeping from the sore.  Leg pain or a feeling of heaviness. This may be worse at the end of the day.  Leg swelling.  Skin color changes. DIAGNOSIS  Your caregiver will make a diagnosis by examining your leg. Your caregiver may order tests such as an ultrasound or other studies to evaluate the blood flow of the leg. HOME CARE INSTRUCTIONS   Do not stand or sit in one position for long periods of time. Do not sit with your legs crossed. Rest with your legs raised during the day. If possible, it is best if you can elevate your legs above your heart for 30 minutes, 3 to 4 times a day.  Wear elastic stockings or support hose. Do not wear other tight encircling garments around legs, pelvis, or waist. This causes increased pressure in your veins. If your caregiver has applied compressive medicated wraps, use them as instructed.  Walk as much as possible to increase blood flow. If you are taking long rides in a car or plane, take a break to walk around every 2 hours. If not already on aspirin, take a baby aspirin before long trips unless you have medical reasons that prohibit this.  Raise the foot of your bed at night with 2-inch blocks if approved by your caregiver. This may not be desirable if you have heart failure or breathing problems.  If you get a cut in the skin over the vein and the vein bleeds, lie down  with your leg raised and gently clean the area with a clean cloth. Apply pressure on the cut until the bleeding stops. Then place a dressing on the cut. See your caregiver if it continues to bleed or needs stitches. Also, see your caregiver if you develop an infection.Signs of an infection include a fever, redness, increased pain, and drainage of pus.  If your caregiver has given you a follow-up appointment, it is very important to keep that appointment. Not keeping the appointment could result in a chronic or permanent injury, pain, and disability. If there is any problem keeping the appointment, call your caregiver for assistance. SEEK IMMEDIATE MEDICAL CARE IF:  The ulcer area starts to break down.  You have pain, redness, tenderness, pus, or hard swelling in your leg over a vein or near the ulcer.  Your leg pain is uncomfortable.  You develop an unexplained fever.  You develop chest pain or shortness of breath. Document Released: 12/13/2000 Document Revised: 06/12/2011 Document Reviewed: 07/10/2010 [facility] Patient Information 2015 [place], [place]. This information is not intended to replace advice given to you by your health care provider. Make sure you discuss any questions you have with your health care provider.

## 2014-03-04 ENCOUNTER — Telehealth: Payer: Self-pay | Admitting: Internal Medicine

## 2014-03-04 NOTE — Telephone Encounter (Signed)
emmi assigned education emailed

## 2014-03-06 ENCOUNTER — Ambulatory Visit (INDEPENDENT_AMBULATORY_CARE_PROVIDER_SITE_OTHER): Payer: Medicare PPO | Admitting: *Deleted

## 2014-03-06 ENCOUNTER — Ambulatory Visit: Payer: Commercial Managed Care - HMO

## 2014-03-06 DIAGNOSIS — Z23 Encounter for immunization: Secondary | ICD-10-CM

## 2014-03-12 ENCOUNTER — Encounter (HOSPITAL_COMMUNITY): Payer: Self-pay | Admitting: Internal Medicine

## 2014-03-25 ENCOUNTER — Ambulatory Visit: Payer: Commercial Managed Care - HMO | Admitting: Internal Medicine

## 2014-03-30 ENCOUNTER — Ambulatory Visit (INDEPENDENT_AMBULATORY_CARE_PROVIDER_SITE_OTHER): Payer: Commercial Managed Care - HMO | Admitting: *Deleted

## 2014-03-30 ENCOUNTER — Encounter: Payer: Self-pay | Admitting: Internal Medicine

## 2014-03-30 DIAGNOSIS — I480 Paroxysmal atrial fibrillation: Secondary | ICD-10-CM

## 2014-03-30 DIAGNOSIS — I441 Atrioventricular block, second degree: Secondary | ICD-10-CM

## 2014-03-30 DIAGNOSIS — I4892 Unspecified atrial flutter: Secondary | ICD-10-CM

## 2014-03-30 LAB — MDC_IDC_ENUM_SESS_TYPE_REMOTE
Battery Remaining Longevity: 135 mo
Brady Statistic AP VP Percent: 6 %
Date Time Interrogation Session: 20151228140257
Lead Channel Impedance Value: 510 Ohm
Lead Channel Impedance Value: 793 Ohm
Lead Channel Pacing Threshold Amplitude: 0.375 V
Lead Channel Pacing Threshold Amplitude: 0.5 V
Lead Channel Sensing Intrinsic Amplitude: 0.7 mV
Lead Channel Setting Pacing Amplitude: 2 V
Lead Channel Setting Pacing Amplitude: 2.5 V
MDC IDC MSMT BATTERY IMPEDANCE: 135 Ohm
MDC IDC MSMT BATTERY VOLTAGE: 2.8 V
MDC IDC MSMT LEADCHNL RA PACING THRESHOLD PULSEWIDTH: 0.4 ms
MDC IDC MSMT LEADCHNL RV PACING THRESHOLD PULSEWIDTH: 0.4 ms
MDC IDC SET LEADCHNL RV PACING PULSEWIDTH: 0.4 ms
MDC IDC SET LEADCHNL RV SENSING SENSITIVITY: 4 mV
MDC IDC STAT BRADY AP VS PERCENT: 0 %
MDC IDC STAT BRADY AS VP PERCENT: 94 %
MDC IDC STAT BRADY AS VS PERCENT: 0 %

## 2014-03-30 NOTE — Progress Notes (Signed)
Remote pacemaker check. 

## 2014-04-10 ENCOUNTER — Encounter: Payer: Self-pay | Admitting: *Deleted

## 2014-04-10 DIAGNOSIS — H2511 Age-related nuclear cataract, right eye: Secondary | ICD-10-CM | POA: Diagnosis not present

## 2014-04-14 ENCOUNTER — Ambulatory Visit (INDEPENDENT_AMBULATORY_CARE_PROVIDER_SITE_OTHER): Payer: Commercial Managed Care - HMO | Admitting: Internal Medicine

## 2014-04-14 ENCOUNTER — Other Ambulatory Visit: Payer: Self-pay | Admitting: Internal Medicine

## 2014-04-14 VITALS — BP 120/78 | HR 83 | Temp 98.1°F | Wt 250.0 lb

## 2014-04-14 DIAGNOSIS — E118 Type 2 diabetes mellitus with unspecified complications: Secondary | ICD-10-CM

## 2014-04-14 DIAGNOSIS — Z01818 Encounter for other preprocedural examination: Secondary | ICD-10-CM

## 2014-04-14 DIAGNOSIS — N259 Disorder resulting from impaired renal tubular function, unspecified: Secondary | ICD-10-CM

## 2014-04-14 DIAGNOSIS — I480 Paroxysmal atrial fibrillation: Secondary | ICD-10-CM
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
## 2014-04-14 NOTE — Patient Instructions (Addendum)
Do not take Eliquis tonight and tomorrow am. Hold diabetes meds tomorrow am.  Jessica [name] is medically clear for her cataract surgery. Thank you!

## 2014-04-14 NOTE — Assessment & Plan Note (Signed)
Continue with current prescription therapy as reflected on the Med list. Low carb diet/wt loss

## 2014-04-14 NOTE — Progress Notes (Signed)
Pre visit review using our clinic review tool, if applicable. No additional management support is needed unless otherwise documented below in the visit note. 

## 2014-04-14 NOTE — Assessment & Plan Note (Addendum)
S/p pacemaker Do not take Eliquis tonight and tomorrow am.

## 2014-04-14 NOTE — Assessment & Plan Note (Signed)
Discussed low-carb diet 

## 2014-04-14 NOTE — Assessment & Plan Note (Signed)
Labs

## 2014-04-14 NOTE — Assessment & Plan Note (Addendum)
Jessica [name] is medically clear for her cataract surgery. Do not take Eliquis tonight and tomorrow am. Hold diabetes meds tomorrow. Thank you!

## 2014-04-15 MED ORDER — TEMAZEPAM 15 MG PO CAPS: ORAL_CAPSULE | ORAL | Status: DC

## 2014-04-15 NOTE — Addendum Note (Signed)
Addended by: Earnstine Regal on: 04/15/2014 10:11 AM   Modules accepted: Orders

## 2014-04-15 NOTE — Telephone Encounter (Signed)
Reprinted temazepam script fax to [facility]...Jessica [name]

## 2014-04-16 DIAGNOSIS — H2511 Age-related nuclear cataract, right eye: Secondary | ICD-10-CM | POA: Diagnosis not present

## 2014-04-16 DIAGNOSIS — H25811 Combined forms of age-related cataract, right eye: Secondary | ICD-10-CM | POA: Diagnosis not present

## 2014-04-30 DIAGNOSIS — H25812 Combined forms of age-related cataract, left eye: Secondary | ICD-10-CM | POA: Diagnosis not present

## 2014-04-30 DIAGNOSIS — H2512 Age-related nuclear cataract, left eye: Secondary | ICD-10-CM | POA: Diagnosis not present

## 2014-05-08 ENCOUNTER — Telehealth: Payer: Self-pay | Admitting: Internal Medicine

## 2014-05-08 NOTE — Telephone Encounter (Signed)
Pt request refill for HYDROcodone-acetaminophen (NORCO) 7.5-325 MG per tablet

## 2014-05-11 MED ORDER — HYDROCODONE-ACETAMINOPHEN 7.5-325 MG PO TABS: 1.0000 | ORAL_TABLET | Freq: Four times a day (QID) | ORAL | Status: DC | PRN

## 2014-05-11 NOTE — Telephone Encounter (Signed)
Called pt no answer LMOM rx ready for pick-up.../lmb 

## 2014-05-11 NOTE — Telephone Encounter (Signed)
OK to fill this prescription with additional refills x0 Thank you!  

## 2014-05-11 NOTE — Telephone Encounter (Signed)
Patient called back on this

## 2014-06-01 ENCOUNTER — Other Ambulatory Visit: Payer: Self-pay | Admitting: Internal Medicine

## 2014-06-02 ENCOUNTER — Telehealth: Payer: Self-pay | Admitting: Internal Medicine

## 2014-06-02 DIAGNOSIS — I480 Paroxysmal atrial fibrillation: Secondary | ICD-10-CM

## 2014-06-02 DIAGNOSIS — I483 Typical atrial flutter: Secondary | ICD-10-CM

## 2014-06-02 NOTE — Telephone Encounter (Signed)
Pt request for our office to send some for apixaban (ELIQUIS) 5 MG to be send to [facility] while she waiting from her mail order. Please advise.

## 2014-06-03 MED ORDER — APIXABAN 5 MG PO TABS: 5.0000 mg | ORAL_TABLET | Freq: Two times a day (BID) | ORAL | Status: DC

## 2014-06-03 NOTE — Telephone Encounter (Signed)
Called pt no answer LMOM sent 30 day to [facility].../lmb

## 2014-06-05 ENCOUNTER — Other Ambulatory Visit: Payer: Self-pay | Admitting: Internal Medicine

## 2014-06-17 DIAGNOSIS — Z0100 Encounter for examination of eyes and vision without abnormal findings: Secondary | ICD-10-CM | POA: Diagnosis not present

## 2014-06-29 ENCOUNTER — Ambulatory Visit (INDEPENDENT_AMBULATORY_CARE_PROVIDER_SITE_OTHER): Payer: Commercial Managed Care - HMO | Admitting: *Deleted

## 2014-06-29 ENCOUNTER — Encounter: Payer: Self-pay | Admitting: Internal Medicine

## 2014-06-29 DIAGNOSIS — I441 Atrioventricular block, second degree: Secondary | ICD-10-CM

## 2014-06-29 LAB — MDC_IDC_ENUM_SESS_TYPE_REMOTE
Battery Impedance: 135 Ohm
Battery Remaining Longevity: 135 mo
Battery Voltage: 2.79 V
Brady Statistic AP VP Percent: 5 %
Lead Channel Pacing Threshold Amplitude: 0.375 V
Lead Channel Pacing Threshold Amplitude: 0.5 V
Lead Channel Pacing Threshold Pulse Width: 0.4 ms
Lead Channel Setting Pacing Amplitude: 2 V
Lead Channel Setting Pacing Amplitude: 2.5 V
Lead Channel Setting Sensing Sensitivity: 4 mV
MDC IDC MSMT LEADCHNL RA IMPEDANCE VALUE: 526 Ohm
MDC IDC MSMT LEADCHNL RA SENSING INTR AMPL: 0.7 mV
MDC IDC MSMT LEADCHNL RV IMPEDANCE VALUE: 790 Ohm
MDC IDC MSMT LEADCHNL RV PACING THRESHOLD PULSEWIDTH: 0.4 ms
MDC IDC SESS DTM: 20160328122218
MDC IDC SET LEADCHNL RV PACING PULSEWIDTH: 0.4 ms
MDC IDC STAT BRADY AP VS PERCENT: 0 %
MDC IDC STAT BRADY AS VP PERCENT: 94 %
MDC IDC STAT BRADY AS VS PERCENT: 1 %

## 2014-06-29 NOTE — Progress Notes (Signed)
Remote pacemaker transmission.   

## 2014-07-03 ENCOUNTER — Other Ambulatory Visit: Payer: Self-pay | Admitting: Internal Medicine

## 2014-07-09 ENCOUNTER — Encounter: Payer: Self-pay | Admitting: Cardiology

## 2014-07-14 ENCOUNTER — Encounter: Payer: Self-pay | Admitting: Internal Medicine

## 2014-07-14 ENCOUNTER — Ambulatory Visit (INDEPENDENT_AMBULATORY_CARE_PROVIDER_SITE_OTHER): Payer: Commercial Managed Care - HMO | Admitting: Internal Medicine

## 2014-07-14 ENCOUNTER — Other Ambulatory Visit (INDEPENDENT_AMBULATORY_CARE_PROVIDER_SITE_OTHER): Payer: Commercial Managed Care - HMO

## 2014-07-14 VITALS — BP 138/82 | HR 77 | Wt 242.0 lb

## 2014-07-14 DIAGNOSIS — E118 Type 2 diabetes mellitus with unspecified complications: Secondary | ICD-10-CM

## 2014-07-14 DIAGNOSIS — I10 Essential (primary) hypertension: Secondary | ICD-10-CM

## 2014-07-14 DIAGNOSIS — E038 Other specified hypothyroidism: Secondary | ICD-10-CM | POA: Diagnosis not present

## 2014-07-14 DIAGNOSIS — R635 Abnormal weight gain: Secondary | ICD-10-CM | POA: Diagnosis not present

## 2014-07-14 DIAGNOSIS — E034 Atrophy of thyroid (acquired): Secondary | ICD-10-CM

## 2014-07-14 LAB — BASIC METABOLIC PANEL
BUN: 21 mg/dL (ref 6–23)
CALCIUM: 10.4 mg/dL (ref 8.4–10.5)
CO2: 28 meq/L (ref 19–32)
CREATININE: 0.77 mg/dL (ref 0.40–1.20)
Chloride: 101 mEq/L (ref 96–112)
GFR: 76.89 mL/min (ref >60.00)
Glucose, Bld: 120 mg/dL — ABNORMAL HIGH (ref 70–99)
Potassium: 3.9 mEq/L (ref 3.5–5.1)
Sodium: 136 mEq/L (ref 135–145)

## 2014-07-14 LAB — HEMOGLOBIN A1C: Hgb A1c MFr Bld: 6.8 % — ABNORMAL HIGH (ref 4.6–6.5)

## 2014-07-14 MED ORDER — HYDROCODONE-ACETAMINOPHEN 7.5-325 MG PO TABS: 1.0000 | ORAL_TABLET | Freq: Four times a day (QID) | ORAL | Status: DC | PRN
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
## 2014-07-14 NOTE — Assessment & Plan Note (Signed)
Labs

## 2014-07-14 NOTE — Assessment & Plan Note (Signed)
Triamt/HCTZ, Losartan 

## 2014-07-14 NOTE — Assessment & Plan Note (Signed)
Chronic On Levothroid - 

## 2014-07-14 NOTE — Assessment & Plan Note (Signed)
Better now:on diet Wt Readings from Last 3 Encounters:  07/14/14 242 lb (109.77 kg)  04/14/14 250 lb (113.399 kg)  02/25/14 251 lb (113.853 kg)

## 2014-07-14 NOTE — Progress Notes (Signed)
Pre visit review using our clinic review tool, if applicable. No additional management support is needed unless otherwise documented below in the visit note. 

## 2014-07-23 ENCOUNTER — Encounter: Payer: Self-pay | Admitting: Cardiology

## 2014-08-10 ENCOUNTER — Telehealth: Payer: Self-pay | Admitting: Family Medicine

## 2014-08-10 DIAGNOSIS — H02051 Trichiasis without entropian right upper eyelid: Secondary | ICD-10-CM | POA: Diagnosis not present

## 2014-08-10 NOTE — Telephone Encounter (Signed)
Patient is  Coming in tomorrow at 12:45 to see Dr. Tamala Julian.  Please make sure [employer] Referral is entered.

## 2014-08-10 NOTE — Telephone Encounter (Signed)
[employer] auth completed.

## 2014-08-11 ENCOUNTER — Ambulatory Visit: Payer: Commercial Managed Care - HMO | Admitting: Family Medicine

## 2014-08-28 ENCOUNTER — Ambulatory Visit: Payer: Commercial Managed Care - HMO | Admitting: Internal Medicine

## 2014-09-02 ENCOUNTER — Telehealth: Payer: Self-pay

## 2014-09-02 NOTE — Telephone Encounter (Signed)
Call to introduce AWV and the patient agreed to schedule for 6/15 at 9:30/

## 2014-09-03 ENCOUNTER — Other Ambulatory Visit: Payer: Self-pay | Admitting: Internal Medicine

## 2014-09-03 MED ORDER — LORAZEPAM 0.5 MG PO TABS: 0.5000 mg | ORAL_TABLET | Freq: Two times a day (BID) | ORAL | Status: DC | PRN

## 2014-09-03 NOTE — Addendum Note (Signed)
Addended by: Cresenciano Lick on: 09/03/2014 03:24 PM   Modules accepted: Orders

## 2014-09-07 ENCOUNTER — Telehealth: Payer: Self-pay | Admitting: Internal Medicine

## 2014-09-07 NOTE — Telephone Encounter (Signed)
Pt called in and needs refill on her HYDROCODONE     BEST NUMBER [phone]

## 2014-09-09 ENCOUNTER — Telehealth: Payer: Self-pay | Admitting: Internal Medicine

## 2014-09-09 NOTE — Telephone Encounter (Signed)
Patient would like a call back in regards to Hydrocodone refill.  Patient would like to know if she can come pick up script.

## 2014-09-10 MED ORDER — HYDROCODONE-ACETAMINOPHEN 7.5-325 MG PO TABS: 1.0000 | ORAL_TABLET | Freq: Four times a day (QID) | ORAL | Status: DC | PRN

## 2014-09-10 NOTE — Telephone Encounter (Signed)
Called pt no answer LMOM rx ready for pick-up.../lmb 

## 2014-09-10 NOTE — Telephone Encounter (Signed)
OK to fill this prescription with additional refills x0 Thank you!  

## 2014-09-16 ENCOUNTER — Ambulatory Visit: Payer: Commercial Managed Care - HMO | Admitting: Internal Medicine

## 2014-09-16 ENCOUNTER — Encounter (HOSPITAL_COMMUNITY): Payer: Self-pay | Admitting: *Deleted

## 2014-09-16 ENCOUNTER — Ambulatory Visit: Payer: Commercial Managed Care - HMO | Admitting: Nurse Practitioner

## 2014-09-16 ENCOUNTER — Emergency Department (HOSPITAL_COMMUNITY)
Admission: EM | Admit: 2014-09-16 | Discharge: 2014-09-16 | Disposition: A | Discharge status: Home / Self Care | Payer: Commercial Managed Care - HMO | Attending: Emergency Medicine | Admitting: Emergency Medicine

## 2014-09-16 ENCOUNTER — Emergency Department (HOSPITAL_COMMUNITY): Payer: Commercial Managed Care - HMO

## 2014-09-16 ENCOUNTER — Ambulatory Visit (INDEPENDENT_AMBULATORY_CARE_PROVIDER_SITE_OTHER): Payer: Commercial Managed Care - HMO

## 2014-09-16 ENCOUNTER — Telehealth: Payer: Self-pay

## 2014-09-16 VITALS — BP 180/80 | HR 68 | Temp 98.0°F | Ht 62.0 in | Wt 250.2 lb

## 2014-09-16 DIAGNOSIS — R1031 Right lower quadrant pain: Secondary | ICD-10-CM | POA: Diagnosis not present

## 2014-09-16 DIAGNOSIS — F419 Anxiety disorder, unspecified: Secondary | ICD-10-CM | POA: Insufficient documentation

## 2014-09-16 DIAGNOSIS — R109 Unspecified abdominal pain: Secondary | ICD-10-CM | POA: Diagnosis not present

## 2014-09-16 DIAGNOSIS — Z950 Presence of cardiac pacemaker: Secondary | ICD-10-CM | POA: Insufficient documentation

## 2014-09-16 DIAGNOSIS — Z8619 Personal history of other infectious and parasitic diseases: Secondary | ICD-10-CM | POA: Diagnosis not present

## 2014-09-16 DIAGNOSIS — Z86718 Personal history of other venous thrombosis and embolism: Secondary | ICD-10-CM | POA: Insufficient documentation

## 2014-09-16 DIAGNOSIS — E669 Obesity, unspecified: Secondary | ICD-10-CM | POA: Diagnosis not present

## 2014-09-16 DIAGNOSIS — Z0389 Encounter for observation for other suspected diseases and conditions ruled out: Secondary | ICD-10-CM | POA: Diagnosis not present

## 2014-09-16 DIAGNOSIS — Z7952 Long term (current) use of systemic steroids: Secondary | ICD-10-CM | POA: Diagnosis not present

## 2014-09-16 DIAGNOSIS — Z79899 Other long term (current) drug therapy: Secondary | ICD-10-CM | POA: Diagnosis not present

## 2014-09-16 DIAGNOSIS — R1084 Generalized abdominal pain: Secondary | ICD-10-CM | POA: Insufficient documentation

## 2014-09-16 DIAGNOSIS — Z86711 Personal history of pulmonary embolism: Secondary | ICD-10-CM | POA: Insufficient documentation

## 2014-09-16 DIAGNOSIS — K449 Diaphragmatic hernia without obstruction or gangrene: Secondary | ICD-10-CM | POA: Diagnosis not present

## 2014-09-16 DIAGNOSIS — Z9049 Acquired absence of other specified parts of digestive tract: Secondary | ICD-10-CM | POA: Diagnosis not present

## 2014-09-16 DIAGNOSIS — Z7982 Long term (current) use of aspirin: Secondary | ICD-10-CM | POA: Insufficient documentation

## 2014-09-16 DIAGNOSIS — D259 Leiomyoma of uterus, unspecified: Secondary | ICD-10-CM | POA: Diagnosis not present

## 2014-09-16 DIAGNOSIS — Z8709 Personal history of other diseases of the respiratory system: Secondary | ICD-10-CM | POA: Insufficient documentation

## 2014-09-16 DIAGNOSIS — K219 Gastro-esophageal reflux disease without esophagitis: Secondary | ICD-10-CM | POA: Diagnosis not present

## 2014-09-16 DIAGNOSIS — M1990 Unspecified osteoarthritis, unspecified site: Secondary | ICD-10-CM | POA: Diagnosis not present

## 2014-09-16 DIAGNOSIS — F329 Major depressive disorder, single episode, unspecified: Secondary | ICD-10-CM | POA: Diagnosis not present

## 2014-09-16 DIAGNOSIS — E119 Type 2 diabetes mellitus without complications: Secondary | ICD-10-CM | POA: Diagnosis not present

## 2014-09-16 DIAGNOSIS — Z87448 Personal history of other diseases of urinary system: Secondary | ICD-10-CM | POA: Diagnosis not present

## 2014-09-16 DIAGNOSIS — E039 Hypothyroidism, unspecified: Secondary | ICD-10-CM | POA: Diagnosis not present

## 2014-09-16 DIAGNOSIS — I10 Essential (primary) hypertension: Secondary | ICD-10-CM | POA: Diagnosis not present

## 2014-09-16 DIAGNOSIS — R1030 Lower abdominal pain, unspecified: Secondary | ICD-10-CM | POA: Diagnosis present

## 2014-09-16 DIAGNOSIS — Z8669 Personal history of other diseases of the nervous system and sense organs: Secondary | ICD-10-CM | POA: Diagnosis not present

## 2014-09-16 DIAGNOSIS — Z87828 Personal history of other (healed) physical injury and trauma: Secondary | ICD-10-CM | POA: Diagnosis not present

## 2014-09-16 DIAGNOSIS — Z9089 Acquired absence of other organs: Secondary | ICD-10-CM | POA: Diagnosis not present

## 2014-09-16 LAB — COMPREHENSIVE METABOLIC PANEL
ALK PHOS: 75 U/L (ref 38–126)
ALT: 25 U/L (ref 14–54)
AST: 32 U/L (ref 15–41)
Albumin: 3.8 g/dL (ref 3.5–5.0)
Anion gap: 10 (ref 5–15)
BUN: 13 mg/dL (ref 6–20)
CHLORIDE: 98 mmol/L — AB (ref 101–111)
CO2: 27 mmol/L (ref 22–32)
Calcium: 9.5 mg/dL (ref 8.9–10.3)
Creatinine, Ser: 0.77 mg/dL (ref 0.44–1.00)
GLUCOSE: 165 mg/dL — AB (ref 65–99)
POTASSIUM: 3.9 mmol/L (ref 3.5–5.1)
Sodium: 135 mmol/L (ref 135–145)
Total Bilirubin: 0.5 mg/dL (ref 0.3–1.2)
Total Protein: 7.5 g/dL (ref 6.5–8.1)

## 2014-09-16 LAB — CBC WITH DIFFERENTIAL/PLATELET
BASOS PCT: 0 % (ref 0–1)
Basophils Absolute: 0 10*3/uL (ref 0.0–0.1)
EOS ABS: 0 10*3/uL (ref 0.0–0.7)
Eosinophils Relative: 1 % (ref 0–5)
HCT: 43.1 % (ref 36.0–46.0)
Hemoglobin: 14.4 g/dL (ref 12.0–15.0)
Lymphocytes Relative: 20 % (ref 12–46)
Lymphs Abs: 1.8 10*3/uL (ref 0.7–4.0)
MCH: 30.7 pg (ref 26.0–34.0)
MCHC: 33.4 g/dL (ref 30.0–36.0)
MCV: 91.9 fL (ref 78.0–100.0)
Monocytes Absolute: 0.5 10*3/uL (ref 0.1–1.0)
Monocytes Relative: 6 % (ref 3–12)
NEUTROS ABS: 6.4 10*3/uL (ref 1.7–7.7)
NEUTROS PCT: 73 % (ref 43–77)
PLATELETS: 268 10*3/uL (ref 150–400)
RBC: 4.69 MIL/uL (ref 3.87–5.11)
RDW: 13.6 % (ref 11.5–15.5)
WBC: 8.8 10*3/uL (ref 4.0–10.5)

## 2014-09-16 LAB — URINE MICROSCOPIC-ADD ON

## 2014-09-16 LAB — URINALYSIS, ROUTINE W REFLEX MICROSCOPIC
BILIRUBIN URINE: NEGATIVE
Glucose, UA: NEGATIVE mg/dL
HGB URINE DIPSTICK: NEGATIVE
KETONES UR: NEGATIVE mg/dL
Nitrite: NEGATIVE
PROTEIN: NEGATIVE mg/dL
Specific Gravity, Urine: 1.014 (ref 1.005–1.030)
UROBILINOGEN UA: 1 mg/dL (ref 0.0–1.0)
pH: 7.5 (ref 5.0–8.0)

## 2014-09-16 LAB — LIPASE, BLOOD: LIPASE: 25 U/L (ref 22–51)

## 2014-09-16 LAB — LACTIC ACID, PLASMA: LACTIC ACID, VENOUS: 2.2 mmol/L — AB (ref 0.5–2.0)

## 2014-09-16 LAB — CBG MONITORING, ED: GLUCOSE-CAPILLARY: 137 mg/dL — AB (ref 65–99)

## 2014-09-16 MED ORDER — FENTANYL CITRATE (PF) 100 MCG/2ML IJ SOLN
100.0000 ug | INTRAMUSCULAR | Status: DC | PRN
  Administered 2014-09-16 (×3): 100 ug via INTRAVENOUS
  Filled 2014-09-16 (×3): qty 2

## 2014-09-16 MED ORDER — ONDANSETRON HCL 4 MG/2ML IJ SOLN
4.0000 mg | Freq: Once | INTRAMUSCULAR | Status: AC
  Administered 2014-09-16: 4 mg via INTRAVENOUS
  Filled 2014-09-16: qty 2

## 2014-09-16 MED ORDER — IOHEXOL 300 MG/ML  SOLN
25.0000 mL | Freq: Once | INTRAMUSCULAR | Status: AC | PRN
  Administered 2014-09-16: 25 mL via ORAL

## 2014-09-16 MED ORDER — IOHEXOL 350 MG/ML SOLN: 100.0000 mL | Freq: Once | INTRAVENOUS | Status: DC | PRN

## 2014-09-16 MED ORDER — IOHEXOL 300 MG/ML  SOLN
100.0000 mL | Freq: Once | INTRAMUSCULAR | Status: AC | PRN
  Administered 2014-09-16: 100 mL via INTRAVENOUS

## 2014-09-16 MED ORDER — HYDROCODONE-ACETAMINOPHEN 5-325 MG PO TABS: 1.0000 | ORAL_TABLET | ORAL | Status: DC | PRN

## 2014-09-16 NOTE — ED Notes (Addendum)
Lactic Acid of 2.2.... Notified MD (Dr. Eulis Foster)

## 2014-09-16 NOTE — ED Notes (Signed)
NAD at this time. Pt is stable and leaving with her friend.

## 2014-09-16 NOTE — Telephone Encounter (Signed)
Note below related to Dr. Buddy Duty and bone density is in error.

## 2014-09-16 NOTE — Discharge Instructions (Signed)

## 2014-09-16 NOTE — Progress Notes (Addendum)
   Subjective:    Patient ID: Jessica [name], female    DOB: [birth year], 81 y.o.   MRN: [id number]  HPI   The patient presented today for AWV; Stated on admission that she was hurting in RLQ; stated it was "not bad" and wanted to continue with the wellness visit.  BP on admission for AWV; around 10:20 180/90; pulse 65; Oxygen 97; temp 98. No hx of flu or other illness  Nurse noted the patient seemed more uncomfortable as the assessment continued;  The assessment was stopped and pain re-evaluated.  Pain in right lower quadrant; escalates when she moves;  points to right side; Occurred the first time this am; no other symptoms; has not felt this in the past; pain level 1-10 states it is a 6 currently. Very "sharp" in nature. Had breakfast this am; 2 bars and orange; It started one hour after she ate and after she walked her dog that she started hurting.  No nausea or vomiting per the patient but later said she had slight nausea this am.  Offered to have MD see and scheduled apt this afternoon at 1pm to evaluate here at [facility]; The patient requested to continue with the assessment and stated "I am ok"   During the assessment; the pain in RLQ continued and worsened. .  Then the patient stated that she had pain in lower jaw this past week; (friday) and pain radiated to chest and felt "funny" at that time. She did not take anything. Stated it lasted x 30 minutes and resolved; Stated she had 3 other like episodes in the last year but doesn't think she has told the doctor.  No hx kidney stones; states gallbladder and appendix have been removed and has never had this type of pain.  Dr. Lauraine Rinne assessed for chest pain. EKG performed; Dr. Alain Marion advised to call  Dr. Jackalyn Lombard office (cardiology). Dr. Jackalyn Lombard office was notified this am of c/o of angina like pain this past Friday and an appointment was scheduled for the patient to fup with cardiologist at their office  at 2:45pm today.  2nd  set of VS (10:45pm)  BP 158/88; pulse 65 and pulse ox was 95%  The patient was trying to get up from the exam table and described RLQ pain as a 10 and could not get up. No nausea or diaphoresis or c/o of chest like pain but only c/o of pain in RLQ.   The patient agreed to have the office call 911 and be transported to the hospital for further evaluation. The patient called the father of the grand-daughter who had accompanied the patient to this visit to come and pick the  grand-dtr up.    Pocketbook was given to the neighbor with the grand-dtr per the patient's request.  911 arrived and transported to [facility] for further evaluation for RLQ pain.   Staff notified Dr. Jackalyn Lombard office of cancellation of apt.  No other H and P was done as the patient was only scheduled with a nurse for the Annual Wellness Visit which is postponed.  Wynetta Fines RN   Medical screening examination/treatment/procedure(s) were performed by non-physician practitioner and as supervising physician I was immediately available for consultation/collaboration. I agree with above. Walker Kehr, MD   Review of Systems     Objective:   Physical Exam        Assessment & Plan:

## 2014-09-16 NOTE — ED Notes (Signed)
Patient's neighbor at bedside asking that patient be given pain meds.  Patient neighbor in and out of room to nurse desk, pacing.

## 2014-09-16 NOTE — ED Notes (Signed)
Pt was at PCP and began to have a new onset of abdominal pain in her RLQ. Psaid that the pain radiated to her belly button.  EMS stated that it felt like a mass in that area.  Pt was given 142mcg of fentanyl in route. VS are as follows: 152/72 HR:76

## 2014-09-16 NOTE — Telephone Encounter (Signed)
AWV done today in which the patient stated that Dr. Cindra Eves does all of labs and states [facility] did her Bone density and to call the office to get a report. Call to [facility] to request bone density result on this patient  LVM with return call to my number [phone].

## 2014-09-16 NOTE — ED Notes (Signed)
Patient ambulated to restroom without difficuty

## 2014-09-17 ENCOUNTER — Encounter: Payer: Self-pay | Admitting: Internal Medicine

## 2014-09-17 ENCOUNTER — Ambulatory Visit (INDEPENDENT_AMBULATORY_CARE_PROVIDER_SITE_OTHER): Payer: Commercial Managed Care - HMO | Admitting: Internal Medicine

## 2014-09-17 ENCOUNTER — Telehealth: Payer: Self-pay

## 2014-09-17 VITALS — BP 160/80 | HR 67 | Temp 97.9°F | Wt 249.0 lb

## 2014-09-17 DIAGNOSIS — I10 Essential (primary) hypertension: Secondary | ICD-10-CM | POA: Diagnosis not present

## 2014-09-17 DIAGNOSIS — R1031 Right lower quadrant pain: Secondary | ICD-10-CM | POA: Diagnosis not present

## 2014-09-17 DIAGNOSIS — B029 Zoster without complications: Secondary | ICD-10-CM

## 2014-09-17 DIAGNOSIS — R079 Chest pain, unspecified: Secondary | ICD-10-CM

## 2014-09-17 HISTORY — DX: Zoster without complications: B02.9

## 2014-09-17 LAB — URINE CULTURE
Culture: 60000
SPECIAL REQUESTS: NORMAL

## 2014-09-17 MED ORDER — OXYCODONE-ACETAMINOPHEN 10-325 MG PO TABS: 0.5000 | ORAL_TABLET | Freq: Four times a day (QID) | ORAL | Status: DC | PRN

## 2014-09-17 MED ORDER — KETOROLAC TROMETHAMINE 30 MG/ML IJ SOLN
30.0000 mg | Freq: Once | INTRAMUSCULAR | Status: AC
  Administered 2014-09-17: 30 mg via INTRAMUSCULAR

## 2014-09-17 MED ORDER — ACYCLOVIR 800 MG PO TABS: 800.0000 mg | ORAL_TABLET | Freq: Every day | ORAL | Status: DC
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
## 2014-09-17 NOTE — Telephone Encounter (Signed)
Patient states she still has RLQ pain and will be seeing Dr. Alain Marion at 11:30

## 2014-09-17 NOTE — Progress Notes (Signed)
Pre visit review using our clinic review tool, if applicable. No additional management support is needed unless otherwise documented below in the visit note. 
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
## 2014-09-18 ENCOUNTER — Telehealth (HOSPITAL_BASED_OUTPATIENT_CLINIC_OR_DEPARTMENT_OTHER): Payer: Self-pay | Admitting: Emergency Medicine

## 2014-09-18 ENCOUNTER — Encounter: Payer: Self-pay | Admitting: Internal Medicine

## 2014-09-18 DIAGNOSIS — R1031 Right lower quadrant pain: Secondary | ICD-10-CM | POA: Insufficient documentation

## 2014-09-18 NOTE — Assessment & Plan Note (Signed)
6/16 ? etiol - acute Abd CT ok Percocet prn Empiric Acyclovir

## 2014-09-18 NOTE — Progress Notes (Signed)
ED Antimicrobial Stewardship Positive Culture Follow Up   Jessica [name] is an 81 y.o. female who presented to [facility] on 09/16/2014 with a chief complaint of  Chief Complaint  Patient presents with  . Abdominal Pain    Recent Results (from the past 720 hour(s))  Urine culture     Status: None   Collection Time: 09/16/14  2:20 PM  Result Value Ref Range Status   Specimen Description URINE, RANDOM  Final   Special Requests Normal  Final   Culture   Final    60,000 COLONIES/ml STREPTOCOCCUS AGALACTIAE TESTING AGAINST S. AGALACTIAE NOT ROUTINELY PERFORMED DUE TO PREDICTABILITY OF AMP/PEN/VAN SUSCEPTIBILITY.    Report Status 09/17/2014 FINAL  Final   Patient presented to ER with abdominal pain x 1 day with RLQ tenderness. UA was negative, but urine culture came back positive with 60K Strep agalactiae. No urinary symptoms. Patient was not discharged on an abx and therapy is still not indicated at this time.  ED Provider: Domenic Moras, PA-C   [name] 09/18/2014, 9:13 AM Infectious Diseases Pharmacist Phone# [phone]

## 2014-09-18 NOTE — Assessment & Plan Note (Signed)
Wt Readings from Last 3 Encounters:  09/17/14 249 lb (112.946 kg)  09/16/14 250 lb (113.399 kg)  09/16/14 250 lb 4 oz (113.513 kg)

## 2014-09-18 NOTE — Assessment & Plan Note (Addendum)
ASA Card f/up to keep

## 2014-09-18 NOTE — Assessment & Plan Note (Signed)
Chronic Not well controlled Risks associated with diet and treatment noncompliance were discussed. Compliance was encouraged. Triamt/HCTZ, Losartan

## 2014-09-18 NOTE — Telephone Encounter (Signed)
Post ED Visit - Positive Culture Follow-up  Culture report reviewed by antimicrobial stewardship pharmacist: []  Wes Kittredge, Pharm.D., BCPS [x]  Heide Guile, Pharm.D., BCPS []  Alycia Rossetti, Pharm.D., BCPS []  Ages, [place].D., BCPS, AAHIVP []  Legrand Como, Pharm.D., BCPS, AAHIVP []  Isac Sarna, Pharm.D., BCPS  Positive urine culture Streptococcus Treated with none, asymptomatic and no further patient follow-up is required at this time.  Hazle Nordmann 09/18/2014, 2:01 PM

## 2014-09-24 ENCOUNTER — Ambulatory Visit (INDEPENDENT_AMBULATORY_CARE_PROVIDER_SITE_OTHER): Payer: Commercial Managed Care - HMO | Admitting: Internal Medicine

## 2014-09-24 ENCOUNTER — Encounter: Payer: Self-pay | Admitting: Internal Medicine

## 2014-09-24 VITALS — BP 139/80 | HR 72 | Wt 248.0 lb

## 2014-09-24 DIAGNOSIS — I10 Essential (primary) hypertension: Secondary | ICD-10-CM | POA: Diagnosis not present

## 2014-09-24 DIAGNOSIS — I6522 Occlusion and stenosis of left carotid artery: Secondary | ICD-10-CM | POA: Diagnosis not present

## 2014-09-24 DIAGNOSIS — R1031 Right lower quadrant pain: Secondary | ICD-10-CM

## 2014-09-24 NOTE — Assessment & Plan Note (Signed)
6/16 on [facility] Screen test Check [place] On Rx

## 2014-09-24 NOTE — Progress Notes (Signed)
Pre visit review using our clinic review tool, if applicable. No additional management support is needed unless otherwise documented below in the visit note. 

## 2014-09-24 NOTE — Assessment & Plan Note (Signed)
Better w/empiric rx for shingles
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
## 2014-09-24 NOTE — Assessment & Plan Note (Addendum)
BP Readings from Last 3 Encounters:  09/24/14 140/80  09/17/14 160/80  09/16/14 117/49   Triamt/HCTZ, Losartan

## 2014-09-28 ENCOUNTER — Telehealth: Payer: Self-pay

## 2014-09-28 ENCOUNTER — Ambulatory Visit (INDEPENDENT_AMBULATORY_CARE_PROVIDER_SITE_OTHER): Payer: Commercial Managed Care - HMO | Admitting: *Deleted

## 2014-09-28 ENCOUNTER — Encounter: Payer: Self-pay | Admitting: Internal Medicine

## 2014-09-28 DIAGNOSIS — I441 Atrioventricular block, second degree: Secondary | ICD-10-CM

## 2014-09-28 NOTE — Addendum Note (Signed)
Addended by: [name] on: 09/28/2014 07:53 AM   Modules accepted: Level of Service

## 2014-09-28 NOTE — Telephone Encounter (Signed)
Call to fup on apt with cardiology to assess for episodes of angina like pain x 4 over the last year, the last episode occuring around June 3rd, lasting x 30 minutes, described as jaw pain which went to her chest and "felt funny" with pressure.  Cardiology was made, but the patient went to ER for RLQ pain and encouraged the patient to fup with cardioiogist. EP study done but has not discussed these episodes; Will call and make an apt.  Will schedule AWV for 9/21 at 8:45 prior to seeing Dr. Alain Marion

## 2014-09-28 NOTE — Progress Notes (Signed)
Remote pacemaker transmission.   

## 2014-10-01 ENCOUNTER — Other Ambulatory Visit: Payer: Self-pay

## 2014-10-01 LAB — CUP PACEART REMOTE DEVICE CHECK
Battery Impedance: 135 Ohm
Battery Voltage: 2.79 V
Brady Statistic AP VS Percent: 0 %
Brady Statistic AS VS Percent: 1 %
Lead Channel Pacing Threshold Pulse Width: 0.4 ms
Lead Channel Sensing Intrinsic Amplitude: 0.7 mV — CL
Lead Channel Setting Sensing Sensitivity: 4 mV
MDC IDC MSMT BATTERY REMAINING LONGEVITY: 134 mo
MDC IDC MSMT LEADCHNL RA IMPEDANCE VALUE: 559 Ohm
MDC IDC MSMT LEADCHNL RA PACING THRESHOLD AMPLITUDE: 0.5 V
MDC IDC MSMT LEADCHNL RV IMPEDANCE VALUE: 769 Ohm
MDC IDC MSMT LEADCHNL RV PACING THRESHOLD AMPLITUDE: 0.375 V
MDC IDC MSMT LEADCHNL RV PACING THRESHOLD PULSEWIDTH: 0.4 ms
MDC IDC SESS DTM: 20160627131323
MDC IDC SET LEADCHNL RA PACING AMPLITUDE: 2 V
MDC IDC SET LEADCHNL RV PACING AMPLITUDE: 2.5 V
MDC IDC SET LEADCHNL RV PACING PULSEWIDTH: 0.4 ms
MDC IDC STAT BRADY AP VP PERCENT: 5 %
MDC IDC STAT BRADY AS VP PERCENT: 95 %

## 2014-10-01 MED ORDER — BLOOD GLUCOSE MONITOR KIT: PACK | Status: DC

## 2014-10-01 MED ORDER — GLUCOSE BLOOD VI STRP: ORAL_STRIP | Status: DC

## 2014-10-01 MED ORDER — LANCETS MISC: Status: DC

## 2014-10-14 ENCOUNTER — Encounter: Payer: Self-pay | Admitting: Cardiology

## 2014-10-14 ENCOUNTER — Telehealth: Payer: Self-pay | Admitting: Internal Medicine

## 2014-10-14 ENCOUNTER — Ambulatory Visit (INDEPENDENT_AMBULATORY_CARE_PROVIDER_SITE_OTHER): Payer: Commercial Managed Care - HMO

## 2014-10-14 DIAGNOSIS — Z23 Encounter for immunization: Secondary | ICD-10-CM | POA: Diagnosis not present

## 2014-10-14 NOTE — Telephone Encounter (Signed)
Patient fell and scratch right leg with rusty nail.  Patient is due for a tetanus.  She would like to know if she can come in for a nurse visit or does she need a OV with a provider.  Patient states her leg is fine and does not need to be seen in her opinion.

## 2014-10-14 NOTE — Telephone Encounter (Signed)
Got scheduled with nurse for today

## 2014-10-14 NOTE — Telephone Encounter (Signed)
A nurse visit is fine if she does not need to be seen. Thanks!

## 2014-10-15 ENCOUNTER — Ambulatory Visit: Payer: Commercial Managed Care - HMO | Admitting: Internal Medicine

## 2014-10-15 DIAGNOSIS — H40013 Open angle with borderline findings, low risk, bilateral: Secondary | ICD-10-CM | POA: Diagnosis not present

## 2014-10-15 DIAGNOSIS — H3531 Nonexudative age-related macular degeneration: Secondary | ICD-10-CM | POA: Diagnosis not present

## 2014-10-19 ENCOUNTER — Ambulatory Visit (INDEPENDENT_AMBULATORY_CARE_PROVIDER_SITE_OTHER): Payer: Commercial Managed Care - HMO

## 2014-10-19 VITALS — BP 130/70 | Ht 63.0 in | Wt 250.0 lb

## 2014-10-19 DIAGNOSIS — Z0000 Encounter for general adult medical examination without abnormal findings: Secondary | ICD-10-CM | POA: Diagnosis not present

## 2014-10-19 NOTE — Patient Instructions (Addendum)
Jessica [name] , Thank you for taking time to come for your Medicare Wellness Visit. I appreciate your ongoing commitment to your health goals. Please review the following plan we discussed and let me know if I can assist you in the future.   1. Agrees to call cardiologist for apt to 2. Agrees to water aerobics x 3  And continue to walk her dog 3. Monitor portions  4. Will schedule mammogarm  These are the goals we discussed: Goals    . Exercise 3x per week (30 min per time)     Agrees to go to go to [employer] and start water aerobic tiw Agrees to watch carbs; last A1c 6.8  Will monitor portions       This is a list of the screening recommended for you and due dates:  Health Maintenance  Topic Date Due  . Eye exam for diabetics  09/17/1943  . Shingles Vaccine  09/18/1995  . DEXA scan (bone density measurement)  09/17/2000  . Urine Protein Check  12/03/2010  . Complete foot exam   11/11/2013  . Flu Shot  11/02/2014  . Hemoglobin A1C  01/13/2015  . Colon Cancer Screening  07/31/2018  . Tetanus Vaccine  10/13/2024  . Pneumonia vaccines  Completed     Health Maintenance Adopting a healthy lifestyle and getting preventive care can go a long way to promote health and wellness. Talk with your health care provider about what schedule of regular examinations is right for you. This is a good chance for you to check in with your provider about disease prevention and staying healthy. In between checkups, there are plenty of things you can do on your own. Experts have done a lot of research about which lifestyle changes and preventive measures are most likely to keep you healthy. Ask your health care provider for more information. WEIGHT AND DIET  Eat a healthy diet  Be sure to include plenty of vegetables, fruits, low-fat dairy products, and lean protein.  Do not eat a lot of foods high in solid fats, added sugars, or salt.  Get regular exercise. This is one of the most important things  you can do for your health.  Most adults should exercise for at least 150 minutes each week. The exercise should increase your heart rate and make you sweat (moderate-intensity exercise).  Most adults should also do strengthening exercises at least twice a week. This is in addition to the moderate-intensity exercise.  Maintain a healthy weight  Body mass index (BMI) is a measurement that can be used to identify possible weight problems. It estimates body fat based on height and weight. Your health care provider can help determine your BMI and help you achieve or maintain a healthy weight.  For females 37 years of age and older:   A BMI below 18.5 is considered underweight.  A BMI of 18.5 to 24.9 is normal.  A BMI of 25 to 29.9 is considered overweight.  A BMI of 30 and above is considered obese.  Watch levels of cholesterol and blood lipids  You should start having your blood tested for lipids and cholesterol at 79 years of age, then have this test every 5 years.  You may need to have your cholesterol levels checked more often if:  Your lipid or cholesterol levels are high.  You are older than 79 years of age.  You are at high risk for heart disease.  CANCER SCREENING   Lung Cancer  Lung  cancer screening is recommended for adults 29-66 years old who are at high risk for lung cancer because of a history of smoking.  A yearly low-dose CT scan of the lungs is recommended for people who:  Currently smoke.  Have quit within the past 15 years.  Have at least a 30-pack-year history of smoking. A pack year is smoking an average of one pack of cigarettes a day for 1 year.  Yearly screening should continue until it has been 15 years since you quit.  Yearly screening should stop if you develop a health problem that would prevent you from having lung cancer treatment.  Breast Cancer  Practice breast self-awareness. This means understanding how your breasts normally appear  and feel.  It also means doing regular breast self-exams. Let your health care provider know about any changes, no matter how small.  If you are in your 20s or 30s, you should have a clinical breast exam (CBE) by a health care provider every 1-3 years as part of a regular health exam.  If you are 77 or older, have a CBE every year. Also consider having a breast X-ray (mammogram) every year.  If you have a family history of breast cancer, talk to your health care provider about genetic screening.  If you are at high risk for breast cancer, talk to your health care provider about having an MRI and a mammogram every year.  Breast cancer gene (BRCA) assessment is recommended for women who have family members with BRCA-related cancers. BRCA-related cancers include:  Breast.  Ovarian.  Tubal.  Peritoneal cancers.  Results of the assessment will determine the need for genetic counseling and BRCA1 and BRCA2 testing. Cervical Cancer Routine pelvic examinations to screen for cervical cancer are no longer recommended for nonpregnant women who are considered low risk for cancer of the pelvic organs (ovaries, uterus, and vagina) and who do not have symptoms. A pelvic examination may be necessary if you have symptoms including those associated with pelvic infections. Ask your health care provider if a screening pelvic exam is right for you.   The Pap test is the screening test for cervical cancer for women who are considered at risk.  If you had a hysterectomy for a problem that was not cancer or a condition that could lead to cancer, then you no longer need Pap tests.  If you are older than 65 years, and you have had normal Pap tests for the past 10 years, you no longer need to have Pap tests.  If you have had past treatment for cervical cancer or a condition that could lead to cancer, you need Pap tests and screening for cancer for at least 20 years after your treatment.  If you no longer get a  Pap test, assess your risk factors if they change (such as having a new sexual partner). This can affect whether you should start being screened again.  Some women have medical problems that increase their chance of getting cervical cancer. If this is the case for you, your health care provider may recommend more frequent screening and Pap tests.  The human papillomavirus (HPV) test is another test that may be used for cervical cancer screening. The HPV test looks for the virus that can cause cell changes in the cervix. The cells collected during the Pap test can be tested for HPV.  The HPV test can be used to screen women 4 years of age and older. Getting tested for HPV can extend  the interval between normal Pap tests from three to five years.  An HPV test also should be used to screen women of any age who have unclear Pap test results.  After 79 years of age, women should have HPV testing as often as Pap tests.  Colorectal Cancer  This type of cancer can be detected and often prevented.  Routine colorectal cancer screening usually begins at 79 years of age and continues through 79 years of age.  Your health care provider may recommend screening at an earlier age if you have risk factors for colon cancer.  Your health care provider may also recommend using home test kits to check for hidden blood in the stool.  A small camera at the end of a tube can be used to examine your colon directly (sigmoidoscopy or colonoscopy). This is done to check for the earliest forms of colorectal cancer.  Routine screening usually begins at age 72.  Direct examination of the colon should be repeated every 5-10 years through 79 years of age. However, you may need to be screened more often if early forms of precancerous polyps or small growths are found. Skin Cancer  Check your skin from head to toe regularly.  Tell your health care provider about any new moles or changes in moles, especially if there is  a change in a mole's shape or color.  Also tell your health care provider if you have a mole that is larger than the size of a pencil eraser.  Always use sunscreen. Apply sunscreen liberally and repeatedly throughout the day.  Protect yourself by wearing long sleeves, pants, a wide-brimmed hat, and sunglasses whenever you are outside. HEART DISEASE, DIABETES, AND HIGH BLOOD PRESSURE   Have your blood pressure checked at least every 1-2 years. High blood pressure causes heart disease and increases the risk of stroke.  If you are between 53 years and 55 years old, ask your health care provider if you should take aspirin to prevent strokes.  Have regular diabetes screenings. This involves taking a blood sample to check your fasting blood sugar level.  If you are at a normal weight and have a low risk for diabetes, have this test once every three years after 79 years of age.  If you are overweight and have a high risk for diabetes, consider being tested at a younger age or more often. PREVENTING INFECTION  Hepatitis B  If you have a higher risk for hepatitis B, you should be screened for this virus. You are considered at high risk for hepatitis B if:  You were born in a country where hepatitis B is common. Ask your health care provider which countries are considered high risk.  Your parents were born in a high-risk country, and you have not been immunized against hepatitis B (hepatitis B vaccine).  You have HIV or AIDS.  You use needles to inject street drugs.  You live with someone who has hepatitis B.  You have had sex with someone who has hepatitis B.  You get hemodialysis treatment.  You take certain medicines for conditions, including cancer, organ transplantation, and autoimmune conditions. Hepatitis C  Blood testing is recommended for:  Everyone born from [birth year] through 1963.  Anyone with known risk factors for hepatitis C. Sexually transmitted infections (STIs)  You  should be screened for sexually transmitted infections (STIs) including gonorrhea and chlamydia if:  You are sexually active and are younger than 79 years of age.  You are older than  79 years of age and your health care provider tells you that you are at risk for this type of infection.  Your sexual activity has changed since you were last screened and you are at an increased risk for chlamydia or gonorrhea. Ask your health care provider if you are at risk.  If you do not have HIV, but are at risk, it may be recommended that you take a prescription medicine daily to prevent HIV infection. This is called pre-exposure prophylaxis (PrEP). You are considered at risk if:  You are sexually active and do not regularly use condoms or know the HIV status of your partner(s).  You take drugs by injection.  You are sexually active with a partner who has HIV. Talk with your health care provider about whether you are at high risk of being infected with HIV. If you choose to begin PrEP, you should first be tested for HIV. You should then be tested every 3 months for as long as you are taking PrEP.  PREGNANCY   If you are premenopausal and you may become pregnant, ask your health care provider about preconception counseling.  If you may become pregnant, take 400 to 800 micrograms (mcg) of folic acid every day.  If you want to prevent pregnancy, talk to your health care provider about birth control (contraception). OSTEOPOROSIS AND MENOPAUSE   Osteoporosis is a disease in which the bones lose minerals and strength with aging. This can result in serious bone fractures. Your risk for osteoporosis can be identified using a bone density scan.  If you are 5 years of age or older, or if you are at risk for osteoporosis and fractures, ask your health care provider if you should be screened.  Ask your health care provider whether you should take a calcium or vitamin D supplement to lower your risk for  osteoporosis.  Menopause may have certain physical symptoms and risks.  Hormone replacement therapy may reduce some of these symptoms and risks. Talk to your health care provider about whether hormone replacement therapy is right for you.  HOME CARE INSTRUCTIONS   Schedule regular health, dental, and eye exams.  Stay current with your immunizations.   Do not use any tobacco products including cigarettes, chewing tobacco, or electronic cigarettes.  If you are pregnant, do not drink alcohol.  If you are breastfeeding, limit how much and how often you drink alcohol.  Limit alcohol intake to no more than 1 drink per day for nonpregnant women. One drink equals 12 ounces of beer, 5 ounces of wine, or 1 ounces of hard liquor.  Do not use street drugs.  Do not share needles.  Ask your health care provider for help if you need support or information about quitting drugs.  Tell your health care provider if you often feel depressed.  Tell your health care provider if you have ever been abused or do not feel safe at home. Document Released: 10/03/2010 Document Revised: 08/04/2013 Document Reviewed: 02/19/2013 [facility] Patient Information 2015 [place], [place]. This information is not intended to replace advice given to you by your health care provider. Make sure you discuss any questions you have with your health care provider.
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
## 2014-10-23 ENCOUNTER — Other Ambulatory Visit: Payer: Self-pay | Admitting: Internal Medicine

## 2014-10-23 DIAGNOSIS — I6522 Occlusion and stenosis of left carotid artery: Secondary | ICD-10-CM

## 2014-10-28 NOTE — Addendum Note (Signed)
Addended by: Gala Lewandowsky B on: 10/28/2014 09:13 AM   Modules accepted: Level of Service

## 2014-11-03 ENCOUNTER — Ambulatory Visit (HOSPITAL_COMMUNITY)
Admission: RE | Admit: 2014-11-03 | Discharge: 2014-11-03 | Disposition: A | Discharge status: Home / Self Care | Payer: Commercial Managed Care - HMO | Source: Ambulatory Visit | Attending: Internal Medicine | Admitting: Internal Medicine

## 2014-11-03 DIAGNOSIS — I6523 Occlusion and stenosis of bilateral carotid arteries: Secondary | ICD-10-CM | POA: Insufficient documentation

## 2014-11-03 DIAGNOSIS — I6522 Occlusion and stenosis of left carotid artery: Secondary | ICD-10-CM

## 2014-11-06 ENCOUNTER — Other Ambulatory Visit: Payer: Self-pay | Admitting: Internal Medicine

## 2014-11-19 ENCOUNTER — Other Ambulatory Visit: Payer: Self-pay | Admitting: Internal Medicine

## 2014-11-23 MED ORDER — TEMAZEPAM 15 MG PO CAPS: 15.0000 mg | ORAL_CAPSULE | Freq: Every evening | ORAL | Status: DC | PRN

## 2014-11-23 NOTE — Telephone Encounter (Signed)
Rx printed/faxed to [facility].

## 2014-11-23 NOTE — Addendum Note (Signed)
Addended by: Cresenciano Lick on: 11/23/2014 10:47 AM   Modules accepted: Orders

## 2014-12-15 ENCOUNTER — Telehealth: Payer: Self-pay

## 2014-12-15 NOTE — Telephone Encounter (Signed)
fup with the patient regarding her apt tentatively scheduled for her next visit with Dr. Camila Li and stated that the AWV was completed and she had no add'l needs at this time and will not need to come in early on 21st prior to Dr. Alain Marion apt.

## 2014-12-21 ENCOUNTER — Other Ambulatory Visit (INDEPENDENT_AMBULATORY_CARE_PROVIDER_SITE_OTHER): Payer: Commercial Managed Care - HMO

## 2014-12-21 DIAGNOSIS — I10 Essential (primary) hypertension: Secondary | ICD-10-CM | POA: Diagnosis not present

## 2014-12-21 DIAGNOSIS — E118 Type 2 diabetes mellitus with unspecified complications: Secondary | ICD-10-CM

## 2014-12-21 LAB — BASIC METABOLIC PANEL
BUN: 20 mg/dL (ref 6–23)
CALCIUM: 9.5 mg/dL (ref 8.4–10.5)
CHLORIDE: 98 meq/L (ref 96–112)
CO2: 28 meq/L (ref 19–32)
Creatinine, Ser: 0.76 mg/dL (ref 0.40–1.20)
GFR: 77.97 mL/min (ref >60.00)
GLUCOSE: 113 mg/dL — AB (ref 70–99)
POTASSIUM: 3.9 meq/L (ref 3.5–5.1)
SODIUM: 134 meq/L — AB (ref 135–145)

## 2014-12-21 LAB — HEMOGLOBIN A1C: Hgb A1c MFr Bld: 7.7 % — ABNORMAL HIGH (ref 4.6–6.5)

## 2014-12-23 ENCOUNTER — Ambulatory Visit (INDEPENDENT_AMBULATORY_CARE_PROVIDER_SITE_OTHER): Payer: Commercial Managed Care - HMO | Admitting: Internal Medicine

## 2014-12-23 ENCOUNTER — Encounter: Payer: Self-pay | Admitting: Internal Medicine

## 2014-12-23 VITALS — BP 112/72 | HR 85 | Wt 258.0 lb

## 2014-12-23 DIAGNOSIS — I480 Paroxysmal atrial fibrillation: Secondary | ICD-10-CM | POA: Diagnosis not present

## 2014-12-23 DIAGNOSIS — E118 Type 2 diabetes mellitus with unspecified complications: Secondary | ICD-10-CM

## 2014-12-23 DIAGNOSIS — I483 Typical atrial flutter: Secondary | ICD-10-CM | POA: Diagnosis not present

## 2014-12-23 DIAGNOSIS — R609 Edema, unspecified: Secondary | ICD-10-CM

## 2014-12-23 DIAGNOSIS — F4323 Adjustment disorder with mixed anxiety and depressed mood: Secondary | ICD-10-CM

## 2014-12-23 DIAGNOSIS — I10 Essential (primary) hypertension: Secondary | ICD-10-CM

## 2014-12-23 MED ORDER — OXYCODONE-ACETAMINOPHEN 10-325 MG PO TABS: 0.5000 | ORAL_TABLET | Freq: Four times a day (QID) | ORAL | Status: DC | PRN

## 2014-12-23 NOTE — Assessment & Plan Note (Signed)
[name] On Eliquis

## 2014-12-23 NOTE — Assessment & Plan Note (Signed)
Diet discussed 

## 2014-12-23 NOTE — Progress Notes (Signed)
Pre visit review using our clinic review tool, if applicable. No additional management support is needed unless otherwise documented below in the visit note. 

## 2014-12-23 NOTE — Assessment & Plan Note (Signed)
Pt is on Effexor

## 2014-12-23 NOTE — Assessment & Plan Note (Signed)
[name] On Eliquis Pacemaker

## 2014-12-23 NOTE — Assessment & Plan Note (Addendum)
Chronic 3/14, 9/16 worse. Loose wt Metformin

## 2014-12-23 NOTE — Progress Notes (Signed)
Subjective:  Patient ID: Jessica [name], female    DOB: [birth year]  Age: 81 y.o. MRN: [id number]  CC: No chief complaint on file.   HPI Jessica [name] presents for gout, venous insufficiency, HTN, DM f/u    Outpatient Prescriptions Prior to Visit  Medication Sig Dispense Refill  . allopurinol (ZYLOPRIM) 100 MG tablet TAKE 1 TABLET EVERY DAY  FOR  GOUT  PREVENTION 90 tablet 3  . b complex vitamins tablet Take 1 tablet by mouth daily.    . blood glucose meter kit and supplies KIT Use to test blood sugar up to three times a day, as directed by physician Dx:E11.09 1 each 0  . Cholecalciferol 1000 UNITS tablet Take 1,000 Units by mouth daily.      [name] CINNAMON PO Take 1 tablet by mouth daily.    . COD LIVER OIL PO Take 2 capsules by mouth daily.    [name] docusate sodium (COLACE) 100 MG capsule Take 200 mg by mouth daily.     [name] ELIQUIS 5 MG TABS tablet TAKE 1 TABLET TWICE DAILY 180 tablet 3  . furosemide (LASIX) 20 MG tablet TAKE 1 TO 2 TABLETS EVERY DAY 180 tablet 0  . glucose blood test strip Use as directed to test blood sugar up to three times a day, as directed by physician. E11.09 100 each 12  . Lancets MISC Use as directed to test blood sugar up to three times a day, as directed by physician. E11.09 300 each 1  . lansoprazole (PREVACID) 30 MG capsule Take 30 mg by mouth daily at 12 noon.    [name] levothyroxine (SYNTHROID, LEVOTHROID) 75 MCG tablet TAKE 1 TABLET EVERY DAY 90 tablet 3  . LORazepam (ATIVAN) 0.5 MG tablet Take 1 tablet (0.5 mg total) by mouth 2 (two) times daily as needed for anxiety. 180 tablet 1  . losartan (COZAAR) 100 MG tablet TAKE 1 TABLET EVERY DAY 90 tablet 3  . metFORMIN (GLUCOPHAGE-XR) 500 MG 24 hr tablet TAKE 1 TABLET EVERY DAY WITH BREAKFAST 90 tablet 3  . Multiple Vitamins-Minerals (OCUVITE PRESERVISION) TABS Take 1 tablet by mouth 2 (two) times daily.    [name] omeprazole (PRILOSEC) 40 MG capsule TAKE 1 CAPSULE EVERY DAY 90 capsule 3  . temazepam (RESTORIL) 15 MG  capsule Take 1 capsule (15 mg total) by mouth at bedtime as needed for sleep. 90 capsule 1  . triamcinolone ointment (KENALOG) 0.5 % APPLY TO AFFECTED AREA(S) THREE TIMES DAILY 45 g 0  . triamterene-hydrochlorothiazide (MAXZIDE-25) 37.5-25 MG per tablet TAKE 1 TABLET DAILY 90 tablet 3  . venlafaxine XR (EFFEXOR-XR) 75 MG 24 hr capsule TAKE 1 CAPSULE EVERY DAY WITH BREAKFAST 90 capsule 3  . oxyCODONE-acetaminophen (PERCOCET) 10-325 MG per tablet Take 0.5-1 tablets by mouth every 6 (six) hours as needed for pain. 60 tablet 0  . nabumetone (RELAFEN) 500 MG tablet Take 500 mg by mouth daily as needed for mild pain.    [name] ondansetron (ZOFRAN) 4 MG tablet Take 1 tablet (4 mg total) by mouth every 8 (eight) hours as needed for nausea or vomiting. (Patient not taking: Reported on 12/23/2014) 20 tablet 1   No facility-administered medications prior to visit.    ROS Review of Systems  Constitutional: Positive for unexpected weight change. Negative for chills, activity change, appetite change and fatigue.  HENT: Negative for congestion, mouth sores and sinus pressure.   Eyes: Negative for visual disturbance.  Respiratory: Negative for cough and chest tightness.  Gastrointestinal: Negative for nausea and abdominal pain.  Genitourinary: Negative for frequency, difficulty urinating and vaginal pain.  Musculoskeletal: Negative for back pain and gait problem.  Skin: Negative for pallor and rash.  Neurological: Negative for dizziness, tremors, weakness, numbness and headaches.  Psychiatric/Behavioral: Negative for suicidal ideas, confusion and sleep disturbance. The patient is nervous/anxious.     Objective:  BP 112/72 mmHg  Pulse 85  Wt 258 lb (117.028 kg)  SpO2 95%  BP Readings from Last 3 Encounters:  12/23/14 112/72  10/19/14 130/70  09/24/14 139/80    Wt Readings from Last 3 Encounters:  12/23/14 258 lb (117.028 kg)  10/19/14 250 lb (113.399 kg)  09/24/14 248 lb (112.492 kg)    Physical  Exam  Constitutional: She appears well-developed. No distress.  HENT:  Head: Normocephalic.  Right Ear: External ear normal.  Left Ear: External ear normal.  Nose: Nose normal.  Mouth/Throat: Oropharynx is clear and moist.  Eyes: Conjunctivae are normal. Pupils are equal, round, and reactive to light. Right eye exhibits no discharge. Left eye exhibits no discharge.  Neck: Normal range of motion. Neck supple. No JVD present. No tracheal deviation present. No thyromegaly present.  Cardiovascular: Normal rate, regular rhythm and normal heart sounds.   Pulmonary/Chest: No stridor. No respiratory distress. She has no wheezes.  Abdominal: Soft. Bowel sounds are normal. She exhibits no distension and no mass. There is no tenderness. There is no rebound and no guarding.  Musculoskeletal: She exhibits no edema or tenderness.  Lymphadenopathy:    She has no cervical adenopathy.  Neurological: She displays normal reflexes. No cranial nerve deficit. She exhibits normal muscle tone. Coordination normal.  Skin: No rash noted. No erythema.  Psychiatric: She has a normal mood and affect. Her behavior is normal. Judgment and thought content normal.  Obese Trace edema B ankles w/hyperpigmentation  Lab Results  Component Value Date   WBC 8.8 09/16/2014   HGB 14.4 09/16/2014   HCT 43.1 09/16/2014   PLT 268 09/16/2014   GLUCOSE 113* 12/21/2014   CHOL 210* 10/10/2012   TRIG 289* 10/10/2012   HDL 39* 10/10/2012   LDLDIRECT 155.9 09/30/2012   LDLCALC 113* 10/10/2012   ALT 25 09/16/2014   AST 32 09/16/2014   NA 134* 12/21/2014   K 3.9 12/21/2014   CL 98 12/21/2014   CREATININE 0.76 12/21/2014   BUN 20 12/21/2014   CO2 28 12/21/2014   TSH 3.867 10/09/2012   INR 3.3 08/26/2009   HGBA1C 7.7* 12/21/2014   MICROALBUR 4.5* 12/02/2009    No results found.  Assessment & Plan:   Diagnoses and all orders for this visit:  Essential hypertension  Paroxysmal atrial fibrillation  Typical atrial  flutter  DM type 2, controlled, with complication  OBESITY, MORBID  Adjustment disorder with mixed anxiety and depressed mood  Edema  Other orders -     oxyCODONE-acetaminophen (PERCOCET) 10-325 MG per tablet; Take 0.5-1 tablets by mouth every 6 (six) hours as needed for pain.  I am having Ms. Koble maintain her Cholecalciferol, COD LIVER OIL PO, OCUVITE PRESERVISION, docusate sodium, ondansetron, triamterene-hydrochlorothiazide, omeprazole, losartan, allopurinol, ELIQUIS, venlafaxine XR, levothyroxine, triamcinolone ointment, metFORMIN, LORazepam, b complex vitamins, CINNAMON PO, lansoprazole, nabumetone, blood glucose meter kit and supplies, glucose blood, Lancets, furosemide, temazepam, and oxyCODONE-acetaminophen.  Meds ordered this encounter  Medications  . oxyCODONE-acetaminophen (PERCOCET) 10-325 MG per tablet    Sig: Take 0.5-1 tablets by mouth every 6 (six) hours as needed for pain.    Dispense:  60 tablet    Refill:  0     Follow-up: Return in about 3 months (around 03/24/2015) for a follow-up visit.  Walker Kehr, MD

## 2014-12-23 NOTE — Assessment & Plan Note (Signed)
NAS diet Risks associated with diet and treatment noncompliance were discussed. Compliance was encouraged. Triamt/HCTZ, Losartan

## 2014-12-23 NOTE — Patient Instructions (Signed)
Venous Stasis or Chronic Venous Insufficiency  Chronic venous insufficiency, also called venous stasis, is a condition that affects the veins in the legs. The condition prevents blood from being pumped through these veins effectively. Blood may no longer be pumped effectively from the legs back to the heart. This condition can range from mild to severe. With proper treatment, you should be able to continue with an active life.  CAUSES   Chronic venous insufficiency occurs when the vein walls become stretched, weakened, or damaged or when valves within the vein are damaged. Some common causes of this include:  · High blood pressure inside the veins (venous hypertension).  · Increased blood pressure in the leg veins from long periods of sitting or standing.  · A blood clot that blocks blood flow in a vein (deep vein thrombosis).  · Inflammation of a superficial vein (phlebitis) that causes a blood clot to form.  RISK FACTORS  Various things can make you more likely to develop chronic venous insufficiency, including:  · Family history of this condition.  · Obesity.  · Pregnancy.  · Sedentary lifestyle.  · Smoking.  · Jobs requiring long periods of standing or sitting in one place.  · Being a certain age. Women in their 40s and 50s and men in their 70s are more likely to develop this condition.  SIGNS AND SYMPTOMS   Symptoms may include:   · Varicose veins.  · Skin breakdown or ulcers.  · Reddened or discolored skin on the leg.  · Brown, smooth, tight, and painful skin just above the ankle, usually on the inside surface (lipodermatosclerosis).  · Swelling.  DIAGNOSIS   To diagnose this condition, your health care provider will take a medical history and do a physical exam. The following tests may be ordered to confirm the diagnosis:  · Duplex ultrasound--A procedure that produces a picture of a blood vessel and nearby organs and also provides information on blood flow through the blood vessel.  · Plethysmography--A  procedure that tests blood flow.  · A venogram, or venography--A procedure used to look at the veins using X-ray and dye.  TREATMENT  The goals of treatment are to help you return to an active life and to minimize pain or disability. Treatment will depend on the severity of the condition. Medical procedures may be needed for severe cases. Treatment options may include:   · Use of compression stockings. These can help with symptoms and lower the chances of the problem getting worse, but they do not cure the problem.  · Sclerotherapy--A procedure involving an injection of a material that "dissolves" the damaged veins. Other veins in the network of blood vessels take over the function of the damaged veins.  · Surgery to remove the vein or cut off blood flow through the vein (vein stripping or laser ablation surgery).  · Surgery to repair a valve.  HOME CARE INSTRUCTIONS   · Wear compression stockings as directed by your health care provider.  · Only take over-the-counter or prescription medicines for pain, discomfort, or fever as directed by your health care provider.  · Follow up with your health care provider as directed.  SEEK MEDICAL CARE IF:   · You have redness, swelling, or increasing pain in the affected area.  · You see a red streak or line that extends up or down from the affected area.  · You have a breakdown or loss of skin in the affected area, even if the breakdown is   small.  · You have an injury to the affected area.  SEEK IMMEDIATE MEDICAL CARE IF:   · You have an injury and open wound in the affected area.  · Your pain is severe and does not improve with medicine.  · You have sudden numbness or weakness in the foot or ankle below the affected area, or you have trouble moving your foot or ankle.  · You have a fever or persistent symptoms for more than 2-3 days.  · You have a fever and your symptoms suddenly get worse.  MAKE SURE YOU:   · Understand these instructions.  · Will watch your  condition.  · Will get help right away if you are not doing well or get worse.  Document Released: 07/24/2006 Document Revised: 01/08/2013 Document Reviewed: 11/25/2012  Exit[employer]® Patient Information ©2015 [employer]. This information is not intended to replace advice given to you by your health care provider. Make sure you discuss any questions you have with your health care provider.

## 2014-12-23 NOTE — Assessment & Plan Note (Signed)
Venous insufficiency, obesity

## 2014-12-24 ENCOUNTER — Ambulatory Visit (INDEPENDENT_AMBULATORY_CARE_PROVIDER_SITE_OTHER): Payer: Commercial Managed Care - HMO

## 2014-12-24 DIAGNOSIS — Z23 Encounter for immunization: Secondary | ICD-10-CM

## 2014-12-28 ENCOUNTER — Ambulatory Visit (INDEPENDENT_AMBULATORY_CARE_PROVIDER_SITE_OTHER): Payer: Commercial Managed Care - HMO | Admitting: Internal Medicine

## 2014-12-28 ENCOUNTER — Encounter: Payer: Self-pay | Admitting: Internal Medicine

## 2014-12-28 VITALS — BP 124/88 | HR 88 | Ht 62.0 in | Wt 254.8 lb

## 2014-12-28 DIAGNOSIS — I10 Essential (primary) hypertension: Secondary | ICD-10-CM

## 2014-12-28 DIAGNOSIS — I441 Atrioventricular block, second degree: Secondary | ICD-10-CM | POA: Diagnosis not present

## 2014-12-28 DIAGNOSIS — I480 Paroxysmal atrial fibrillation: Secondary | ICD-10-CM | POA: Diagnosis not present

## 2014-12-28 DIAGNOSIS — I350 Nonrheumatic aortic (valve) stenosis: Secondary | ICD-10-CM | POA: Insufficient documentation

## 2014-12-28 LAB — CUP PACEART INCLINIC DEVICE CHECK
Brady Statistic AP VP Percent: 4 %
Brady Statistic AS VS Percent: 1 %
Date Time Interrogation Session: 20160926103635
Lead Channel Pacing Threshold Amplitude: 0.5 V
Lead Channel Pacing Threshold Pulse Width: 0.4 ms
Lead Channel Sensing Intrinsic Amplitude: 11.2 mV
Lead Channel Setting Pacing Amplitude: 2.5 V
Lead Channel Setting Pacing Pulse Width: 0.4 ms
MDC IDC MSMT BATTERY IMPEDANCE: 159 Ohm
MDC IDC MSMT BATTERY REMAINING LONGEVITY: 129 mo
MDC IDC MSMT BATTERY VOLTAGE: 2.79 V
MDC IDC MSMT LEADCHNL RA IMPEDANCE VALUE: 550 Ohm
MDC IDC MSMT LEADCHNL RA PACING THRESHOLD AMPLITUDE: 0.5 V
MDC IDC MSMT LEADCHNL RA PACING THRESHOLD PULSEWIDTH: 0.4 ms
MDC IDC MSMT LEADCHNL RA SENSING INTR AMPL: 0.7 mV
MDC IDC MSMT LEADCHNL RV IMPEDANCE VALUE: 750 Ohm
MDC IDC SET LEADCHNL RA PACING AMPLITUDE: 2 V
MDC IDC SET LEADCHNL RV SENSING SENSITIVITY: 4 mV
MDC IDC STAT BRADY AP VS PERCENT: 0 %
MDC IDC STAT BRADY AS VP PERCENT: 95 %
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
## 2014-12-28 NOTE — Patient Instructions (Addendum)
Medication Instructions: - no changes  Labwork: - none  Procedures/Testing: - Your physician has requested that you have an echocardiogram. Echocardiography is a painless test that uses sound waves to create images of your heart. It provides your doctor with information about the size and shape of your heart and how well your heart's chambers and valves are working. This procedure takes approximately one hour. There are no restrictions for this procedure.  Follow-Up: - Remote monitoring is used to monitor your Pacemaker of ICD from home. This monitoring reduces the number of office visits required to check your device to one time per year. It allows [place] to keep an eye on the functioning of your device to ensure it is working properly. You are scheduled for a device check from home on 03/30/15. You may send your transmission at any time that day. If you have a wireless device, the transmission will be sent automatically. After your physician reviews your transmission, you will receive a postcard with your next transmission date.  - Your physician wants you to follow-up in: 1 year with Dr. Rayann Heman. You will receive a reminder letter in the mail two months in advance. If you don't receive a letter, please call our office to schedule the follow-up appointment.  Any Additional Special Instructions Will Be Listed Below (If Applicable). - none

## 2015-01-05 ENCOUNTER — Other Ambulatory Visit: Payer: Self-pay

## 2015-01-05 ENCOUNTER — Ambulatory Visit (HOSPITAL_COMMUNITY): Payer: Commercial Managed Care - HMO | Attending: Cardiology

## 2015-01-05 DIAGNOSIS — I10 Essential (primary) hypertension: Secondary | ICD-10-CM | POA: Insufficient documentation

## 2015-01-05 DIAGNOSIS — I517 Cardiomegaly: Secondary | ICD-10-CM | POA: Diagnosis not present

## 2015-01-05 DIAGNOSIS — E119 Type 2 diabetes mellitus without complications: Secondary | ICD-10-CM | POA: Diagnosis not present

## 2015-01-05 DIAGNOSIS — E785 Hyperlipidemia, unspecified: Secondary | ICD-10-CM | POA: Insufficient documentation

## 2015-01-05 DIAGNOSIS — I350 Nonrheumatic aortic (valve) stenosis: Secondary | ICD-10-CM | POA: Diagnosis not present

## 2015-01-07 ENCOUNTER — Other Ambulatory Visit: Payer: Self-pay | Admitting: Internal Medicine

## 2015-01-22 ENCOUNTER — Telehealth: Payer: Self-pay | Admitting: Internal Medicine

## 2015-01-22 MED ORDER — HYDROCODONE-ACETAMINOPHEN 7.5-325 MG PO TABS: 1.0000 | ORAL_TABLET | Freq: Four times a day (QID) | ORAL | Status: DC | PRN

## 2015-01-22 NOTE — Telephone Encounter (Signed)
I called pt- she states her pain med was changed at her last 12/23/14 OV. She states the Oxycodone 10/325 is not working like the previous pain med did. She also states she wasn't aware that it was being changed. She c/o shoulder pain.  Please advise.

## 2015-01-22 NOTE — Telephone Encounter (Signed)
Patient called to follow up. Advised that you have been in clinic, and the only one who can answer her questions as to [facility] her pain meds were changed is Dr. Alain Marion.

## 2015-01-22 NOTE — Telephone Encounter (Signed)
Pt informed Rx was changed. New Hydrocodone 7.5/325 mg Rx is upfront. Pt advised to bring Oxycodone back to office to give to MD to dispose of. Pt understands.

## 2015-01-22 NOTE — Telephone Encounter (Signed)
Is requesting call back.  Patient states she has shoulder pain and she would like to know why her pain medication was changed.

## 2015-01-29 ENCOUNTER — Telehealth: Payer: Self-pay | Admitting: Internal Medicine

## 2015-01-29 NOTE — Telephone Encounter (Signed)
Spoke with patient and she is good.  She assures me if she has a problem she will call us.  I let him know I could not disclose any of her health related information to him without her permission.  By echo she has moderate AS which is unchanged

## 2015-01-29 NOTE — Telephone Encounter (Signed)
New Message  Pt friend called to speak w/ RN concerning pt condition and recent echo. Pt has been c/o fatigue and weakness more so recently. Please call back and discuss.

## 2015-02-15 ENCOUNTER — Telehealth: Payer: Self-pay | Admitting: Internal Medicine

## 2015-02-15 ENCOUNTER — Encounter: Payer: Self-pay | Admitting: Cardiology

## 2015-02-15 ENCOUNTER — Other Ambulatory Visit: Payer: Self-pay | Admitting: Internal Medicine

## 2015-02-15 NOTE — Telephone Encounter (Signed)
Will see Dr Oval Linsey on 03/03/15 at 11:15.  I have left a message for the patient with time and date

## 2015-02-15 NOTE — Telephone Encounter (Signed)
Spoke with patient and she says she feels weaker.  She notices problems after walking her dogs and her neighbor is really worrying her about being seen.  I have discussed with [name] who wants her to see Dr Oval Linsey for her AS.  Lenna Sciara is going to schedule the patient and we will let her know

## 2015-02-15 NOTE — Telephone Encounter (Signed)
New Message     Pt calling stating that she is getting weaker, has been dx w/ Aortic Stenosis and is more trouble than she has been having. Please call back and advise.

## 2015-02-15 NOTE — Telephone Encounter (Signed)
Ok to refill 

## 2015-02-17 ENCOUNTER — Other Ambulatory Visit: Payer: Self-pay

## 2015-02-17 NOTE — Telephone Encounter (Signed)
Done

## 2015-02-26 ENCOUNTER — Other Ambulatory Visit: Payer: Self-pay | Admitting: Internal Medicine

## 2015-03-01 NOTE — Telephone Encounter (Signed)
Pt called in again regarding this medication

## 2015-03-01 NOTE — Telephone Encounter (Signed)
Ok to refill 
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
## 2015-03-03 ENCOUNTER — Encounter: Payer: Self-pay | Admitting: Cardiovascular Disease

## 2015-03-03 ENCOUNTER — Other Ambulatory Visit: Payer: Self-pay | Admitting: Cardiovascular Disease

## 2015-03-03 ENCOUNTER — Ambulatory Visit (INDEPENDENT_AMBULATORY_CARE_PROVIDER_SITE_OTHER): Payer: Commercial Managed Care - HMO | Admitting: Cardiovascular Disease

## 2015-03-03 VITALS — BP 124/72 | HR 82 | Ht 62.0 in | Wt 261.5 lb

## 2015-03-03 DIAGNOSIS — R0602 Shortness of breath: Secondary | ICD-10-CM | POA: Diagnosis not present

## 2015-03-03 DIAGNOSIS — R079 Chest pain, unspecified: Secondary | ICD-10-CM | POA: Diagnosis not present

## 2015-03-03 DIAGNOSIS — R5383 Other fatigue: Secondary | ICD-10-CM | POA: Diagnosis not present

## 2015-03-03 DIAGNOSIS — I739 Peripheral vascular disease, unspecified: Secondary | ICD-10-CM

## 2015-03-03 LAB — TSH: TSH: 3.592 u[IU]/mL (ref 0.350–4.500)

## 2015-03-03 LAB — T4, FREE: Free T4: 1.06 ng/dL (ref 0.80–1.80)

## 2015-03-03 NOTE — Patient Instructions (Addendum)
Medication Instructions:  Your physician recommends that you continue on your current medications as directed. Please refer to the Current Medication list given to you today.  Labwork: TSH/FT4 TODAY DOWNSTARIS  Testing/Procedures: Your physician has requested that you have a lexiscan myoview. For further information please visit [email or web address]. Please follow instruction sheet, as given. 2 DAY   Your physician has requested that you have an ankle brachial index (ABI). During this test an ultrasound and blood pressure cuff are used to evaluate the arteries that supply the arms and legs with blood. Allow thirty minutes for this exam. There are no restrictions or special instructions.  Follow-Up: Your physician recommends that you schedule a follow-up appointment in: [place]  If you need a refill on your cardiac medications before your next appointment, please call your pharmacy.

## 2015-03-03 NOTE — Telephone Encounter (Signed)
Pt called to check up on this request, pt is running of this med and unhappy with how long we take to response to this.

## 2015-03-04 ENCOUNTER — Other Ambulatory Visit: Payer: Self-pay | Admitting: Cardiovascular Disease

## 2015-03-04 DIAGNOSIS — I739 Peripheral vascular disease, unspecified: Secondary | ICD-10-CM

## 2015-03-04 MED ORDER — LORAZEPAM 0.5 MG PO TABS: 0.5000 mg | ORAL_TABLET | Freq: Two times a day (BID) | ORAL | Status: DC | PRN

## 2015-03-04 NOTE — Telephone Encounter (Signed)
Rx printed/signed/faxed to pharmacy. Pt informed

## 2015-03-04 NOTE — Addendum Note (Signed)
Addended by: Cresenciano Lick on: 03/04/2015 08:44 AM   Modules accepted: Orders

## 2015-03-08 ENCOUNTER — Telehealth: Payer: Self-pay | Admitting: *Deleted

## 2015-03-08 NOTE — Telephone Encounter (Signed)
Spoke to patient. Result given . Verbalized understanding  

## 2015-03-08 NOTE — Telephone Encounter (Signed)
-----   Message from Skeet Latch, MD sent at 03/05/2015  1:53 PM EST ----- Normal thyroid function.

## 2015-03-11 ENCOUNTER — Other Ambulatory Visit (INDEPENDENT_AMBULATORY_CARE_PROVIDER_SITE_OTHER): Payer: Commercial Managed Care - HMO

## 2015-03-11 DIAGNOSIS — I10 Essential (primary) hypertension: Secondary | ICD-10-CM

## 2015-03-11 DIAGNOSIS — E118 Type 2 diabetes mellitus with unspecified complications: Secondary | ICD-10-CM

## 2015-03-11 LAB — BASIC METABOLIC PANEL
BUN: 17 mg/dL (ref 6–23)
CALCIUM: 9.8 mg/dL (ref 8.4–10.5)
CO2: 30 meq/L (ref 19–32)
Chloride: 99 mEq/L (ref 96–112)
Creatinine, Ser: 0.81 mg/dL (ref 0.40–1.20)
GFR: 72.41 mL/min (ref >60.00)
GLUCOSE: 142 mg/dL — AB (ref 70–99)
POTASSIUM: 3.7 meq/L (ref 3.5–5.1)
SODIUM: 138 meq/L (ref 135–145)

## 2015-03-11 LAB — HEMOGLOBIN A1C: Hgb A1c MFr Bld: 7.9 % — ABNORMAL HIGH (ref 4.6–6.5)

## 2015-03-17 ENCOUNTER — Encounter: Payer: Self-pay | Admitting: Internal Medicine

## 2015-03-17 ENCOUNTER — Other Ambulatory Visit: Payer: Self-pay | Admitting: Internal Medicine

## 2015-03-17 ENCOUNTER — Ambulatory Visit (INDEPENDENT_AMBULATORY_CARE_PROVIDER_SITE_OTHER): Payer: Commercial Managed Care - HMO | Admitting: Internal Medicine

## 2015-03-17 VITALS — BP 130/70 | HR 96 | Wt 259.0 lb

## 2015-03-17 DIAGNOSIS — E118 Type 2 diabetes mellitus with unspecified complications: Secondary | ICD-10-CM | POA: Diagnosis not present

## 2015-03-17 DIAGNOSIS — I350 Nonrheumatic aortic (valve) stenosis: Secondary | ICD-10-CM

## 2015-03-17 DIAGNOSIS — I4892 Unspecified atrial flutter: Secondary | ICD-10-CM | POA: Diagnosis not present

## 2015-03-17 DIAGNOSIS — I10 Essential (primary) hypertension: Secondary | ICD-10-CM

## 2015-03-17 MED ORDER — HYDROCODONE-ACETAMINOPHEN 7.5-325 MG PO TABS: 1.0000 | ORAL_TABLET | Freq: Four times a day (QID) | ORAL | Status: DC | PRN

## 2015-03-17 MED ORDER — METFORMIN HCL ER 750 MG PO TB24: 750.0000 mg | ORAL_TABLET | Freq: Every day | ORAL | Status: DC

## 2015-03-17 NOTE — Patient Instructions (Signed)
Cut back on bread, sweets, potato

## 2015-03-17 NOTE — Progress Notes (Signed)
Pre visit review using our clinic review tool, if applicable. No additional management support is needed unless otherwise documented below in the visit note. 

## 2015-03-17 NOTE — Assessment & Plan Note (Signed)
2016 moderate AS ECHO in 12 mo CL pending Loose wt

## 2015-03-17 NOTE — Progress Notes (Signed)
Subjective:  Patient ID: Jessica [name], female    DOB: May 15, 1933  Age: 81 y.o. MRN: [id number]  CC: No chief complaint on file.   HPI KAYDIE PETSCH presents for AS, hypothyroidism, LBP, DM f/u  Outpatient Prescriptions Prior to Visit  Medication Sig Dispense Refill  . allopurinol (ZYLOPRIM) 100 MG tablet Take 100 mg by mouth daily. Gout prevention    . apixaban (ELIQUIS) 5 MG TABS tablet Take 5 mg by mouth 2 (two) times daily.    [name] b complex vitamins tablet Take 1 tablet by mouth daily.    . blood glucose meter kit and supplies KIT Use to test blood sugar up to three times a day, as directed by physician Dx:E11.09 1 each 0  . Cholecalciferol 1000 UNITS tablet Take 1,000 Units by mouth daily.      [name] CINNAMON PO Take 1 tablet by mouth daily.    . COD LIVER OIL PO Take 2 capsules by mouth daily.    . furosemide (LASIX) 20 MG tablet Take 20 mg by mouth daily.     [name] glucose blood test strip Use as directed to test blood sugar up to three times a day, as directed by physician. E11.09 100 each 12  . HYDROcodone-acetaminophen (NORCO) 7.5-325 MG tablet Take 1 tablet by mouth every 6 (six) hours as needed for severe pain. 120 tablet 0  . Lancets MISC Use as directed to test blood sugar up to three times a day, as directed by physician. E11.09 300 each 1  . levothyroxine (SYNTHROID, LEVOTHROID) 75 MCG tablet Take 75 mcg by mouth daily before breakfast.    . LORazepam (ATIVAN) 0.5 MG tablet Take 1 tablet (0.5 mg total) by mouth 2 (two) times daily as needed for anxiety. 180 tablet 1  . losartan (COZAAR) 100 MG tablet Take 100 mg by mouth daily.    . metFORMIN (GLUCOPHAGE-XR) 500 MG 24 hr tablet Take 500 mg by mouth daily with breakfast.    . Multiple Vitamins-Minerals (OCUVITE PRESERVISION) TABS Take 1 tablet by mouth 2 (two) times daily.    [name] omeprazole (PRILOSEC) 40 MG capsule TAKE 1 CAPSULE EVERY DAY 90 capsule 3  . temazepam (RESTORIL) 15 MG capsule TAKE 1 CAPSULE AT BEDTIME AS  NEEDED  FOR  SLEEP 90 capsule 1  . triamcinolone ointment (KENALOG) 0.5 % Apply 1 application topically 3 (three) times daily as needed (for skin).    [name] triamterene-hydrochlorothiazide (MAXZIDE-25) 37.5-25 MG tablet TAKE 1 TABLET EVERY DAY 90 tablet 3  . venlafaxine (EFFEXOR) 75 MG tablet Take 75 mg by mouth daily with breakfast.     No facility-administered medications prior to visit.    ROS Review of Systems  Constitutional: Positive for fatigue. Negative for chills, activity change, appetite change and unexpected weight change.  HENT: Negative for congestion, mouth sores and sinus pressure.   Eyes: Negative for visual disturbance.  Respiratory: Positive for shortness of breath. Negative for cough and chest tightness.   Gastrointestinal: Negative for nausea and abdominal pain.  Genitourinary: Negative for frequency, difficulty urinating and vaginal pain.  Musculoskeletal: Positive for back pain and neck pain. Negative for gait problem.  Skin: Negative for pallor and rash.  Neurological: Negative for dizziness, tremors, syncope, weakness, numbness and headaches.  Psychiatric/Behavioral: Negative for suicidal ideas, confusion, sleep disturbance and decreased concentration. The patient is nervous/anxious.     Objective:  BP 130/70 mmHg  Pulse 96  Wt 259 lb (117.482 kg)  SpO2 96%  BP Readings  from Last 3 Encounters:  03/17/15 130/70  03/03/15 124/72  12/28/14 124/88    Wt Readings from Last 3 Encounters:  03/17/15 259 lb (117.482 kg)  03/03/15 261 lb 8 oz (118.616 kg)  12/28/14 254 lb 12.8 oz (115.577 kg)    Physical Exam  Constitutional: She appears well-developed. No distress.  HENT:  Head: Normocephalic.  Right Ear: External ear normal.  Left Ear: External ear normal.  Nose: Nose normal.  Mouth/Throat: Oropharynx is clear and moist.  Eyes: Conjunctivae are normal. Pupils are equal, round, and reactive to light. Right eye exhibits no discharge. Left eye exhibits no  discharge.  Neck: Normal range of motion. Neck supple. No JVD present. No tracheal deviation present. No thyromegaly present.  Cardiovascular: Normal rate, regular rhythm and normal heart sounds.   Pulmonary/Chest: No stridor. No respiratory distress. She has no wheezes.  Abdominal: Soft. Bowel sounds are normal. She exhibits no distension and no mass. There is no tenderness. There is no rebound and no guarding.  Musculoskeletal: She exhibits tenderness. She exhibits no edema.  Lymphadenopathy:    She has no cervical adenopathy.  Neurological: She displays normal reflexes. No cranial nerve deficit. She exhibits normal muscle tone. Coordination normal.  Skin: No rash noted. No erythema.  Psychiatric: She has a normal mood and affect. Her behavior is normal. Judgment and thought content normal.  Obese 2/16 murmur  Lab Results  Component Value Date   WBC 8.8 09/16/2014   HGB 14.4 09/16/2014   HCT 43.1 09/16/2014   PLT 268 09/16/2014   GLUCOSE 142* 03/11/2015   CHOL 210* 10/10/2012   TRIG 289* 10/10/2012   HDL 39* 10/10/2012   LDLDIRECT 155.9 09/30/2012   LDLCALC 113* 10/10/2012   ALT 25 09/16/2014   AST 32 09/16/2014   NA 138 03/11/2015   K 3.7 03/11/2015   CL 99 03/11/2015   CREATININE 0.81 03/11/2015   BUN 17 03/11/2015   CO2 30 03/11/2015   TSH 3.592 03/03/2015   INR 3.3 08/26/2009   HGBA1C 7.9* 03/11/2015   MICROALBUR 4.5* 12/02/2009    No results found.  Assessment & Plan:   There are no diagnoses linked to this encounter. I am having Ms. Haser maintain her Cholecalciferol, COD LIVER OIL PO, OCUVITE PRESERVISION, b complex vitamins, CINNAMON PO, blood glucose meter kit and supplies, glucose blood, Lancets, allopurinol, apixaban, furosemide, losartan, metFORMIN, triamcinolone ointment, venlafaxine, levothyroxine, omeprazole, triamterene-hydrochlorothiazide, HYDROcodone-acetaminophen, temazepam, and LORazepam.  No orders of the defined types were placed in this  encounter.     Follow-up: No Follow-up on file.  Walker Kehr, MD

## 2015-03-17 NOTE — Assessment & Plan Note (Signed)
Dr Allred On Eliquis Pacemaker 

## 2015-03-17 NOTE — Assessment & Plan Note (Signed)
Metformin 

## 2015-03-17 NOTE — Assessment & Plan Note (Signed)
Triamt/HCTZ, Losartan 

## 2015-03-18 ENCOUNTER — Encounter (HOSPITAL_COMMUNITY): Payer: Commercial Managed Care - HMO

## 2015-03-18 ENCOUNTER — Inpatient Hospital Stay (HOSPITAL_COMMUNITY): Admission: RE | Admit: 2015-03-18 | Payer: Commercial Managed Care - HMO | Source: Ambulatory Visit

## 2015-03-18 ENCOUNTER — Ambulatory Visit: Payer: Commercial Managed Care - HMO | Admitting: Internal Medicine

## 2015-03-18 ENCOUNTER — Ambulatory Visit (HOSPITAL_COMMUNITY): Payer: Commercial Managed Care - HMO

## 2015-03-19 ENCOUNTER — Ambulatory Visit (HOSPITAL_COMMUNITY): Payer: Commercial Managed Care - HMO

## 2015-03-30 ENCOUNTER — Ambulatory Visit (INDEPENDENT_AMBULATORY_CARE_PROVIDER_SITE_OTHER): Payer: Commercial Managed Care - HMO | Admitting: *Deleted

## 2015-03-30 ENCOUNTER — Telehealth: Payer: Self-pay | Admitting: Cardiology

## 2015-03-30 DIAGNOSIS — I441 Atrioventricular block, second degree: Secondary | ICD-10-CM | POA: Diagnosis not present

## 2015-03-30 NOTE — Telephone Encounter (Signed)
Attempted to confirm remote transmission with pt. No answer and was unable to leave a message.   

## 2015-03-31 LAB — CUP PACEART REMOTE DEVICE CHECK
Brady Statistic AS VP Percent: 97 %
Date Time Interrogation Session: 20161227213014
Implantable Lead Implant Date: 20140710
Implantable Lead Location: 753859
Implantable Lead Model: 5092
Lead Channel Pacing Threshold Amplitude: 0.375 V
Lead Channel Pacing Threshold Amplitude: 0.5 V
Lead Channel Pacing Threshold Pulse Width: 0.4 ms
Lead Channel Setting Pacing Amplitude: 2 V
Lead Channel Setting Pacing Amplitude: 2.5 V
Lead Channel Setting Pacing Pulse Width: 0.4 ms
Lead Channel Setting Sensing Sensitivity: 4 mV
MDC IDC LEAD IMPLANT DT: 20140710
MDC IDC LEAD LOCATION: 753860
MDC IDC LEAD MODEL: 5592
MDC IDC MSMT BATTERY IMPEDANCE: 159 Ohm
MDC IDC MSMT BATTERY REMAINING LONGEVITY: 126 mo
MDC IDC MSMT BATTERY VOLTAGE: 2.79 V
MDC IDC MSMT LEADCHNL RA IMPEDANCE VALUE: 534 Ohm
MDC IDC MSMT LEADCHNL RA PACING THRESHOLD PULSEWIDTH: 0.4 ms
MDC IDC MSMT LEADCHNL RA SENSING INTR AMPL: 0.7 mV
MDC IDC MSMT LEADCHNL RV IMPEDANCE VALUE: 705 Ohm
MDC IDC STAT BRADY AP VP PERCENT: 2 %
MDC IDC STAT BRADY AP VS PERCENT: 0 %
MDC IDC STAT BRADY AS VS PERCENT: 1 %

## 2015-03-31 NOTE — Progress Notes (Signed)
Remote pacemaker transmission.   

## 2015-04-01 ENCOUNTER — Encounter: Payer: Self-pay | Admitting: Cardiology

## 2015-05-13 ENCOUNTER — Other Ambulatory Visit: Payer: Self-pay | Admitting: Internal Medicine

## 2015-06-01 ENCOUNTER — Ambulatory Visit: Payer: Commercial Managed Care - HMO | Admitting: Cardiovascular Disease

## 2015-06-01 ENCOUNTER — Telehealth: Payer: Self-pay | Admitting: Internal Medicine

## 2015-06-01 NOTE — Telephone Encounter (Signed)
Pt request refill  HYDROcodone-acetaminophen (NORCO) 7.5-325 MG tablet  Pt can not get through on phone and was frustrated.

## 2015-06-01 NOTE — Telephone Encounter (Signed)
OK to fill this prescription with additional refills x0 Thank you!  

## 2015-06-02 MED ORDER — HYDROCODONE-ACETAMINOPHEN 7.5-325 MG PO TABS: 1.0000 | ORAL_TABLET | Freq: Four times a day (QID) | ORAL | Status: DC | PRN

## 2015-06-02 NOTE — Telephone Encounter (Signed)
Notified pt rx ready for pick-up.../lmb 

## 2015-06-03 ENCOUNTER — Other Ambulatory Visit: Payer: Self-pay | Admitting: Internal Medicine

## 2015-06-14 ENCOUNTER — Other Ambulatory Visit (INDEPENDENT_AMBULATORY_CARE_PROVIDER_SITE_OTHER): Payer: Commercial Managed Care - HMO

## 2015-06-14 DIAGNOSIS — I10 Essential (primary) hypertension: Secondary | ICD-10-CM | POA: Diagnosis not present

## 2015-06-14 DIAGNOSIS — E119 Type 2 diabetes mellitus without complications: Secondary | ICD-10-CM | POA: Diagnosis not present

## 2015-06-14 LAB — BASIC METABOLIC PANEL
BUN: 16 mg/dL (ref 6–23)
CALCIUM: 10.1 mg/dL (ref 8.4–10.5)
CO2: 29 meq/L (ref 19–32)
CREATININE: 0.82 mg/dL (ref 0.40–1.20)
Chloride: 98 mEq/L (ref 96–112)
GFR: 71.34 mL/min (ref >60.00)
GLUCOSE: 175 mg/dL — AB (ref 70–99)
Potassium: 4 mEq/L (ref 3.5–5.1)
Sodium: 137 mEq/L (ref 135–145)

## 2015-06-14 LAB — HEMOGLOBIN A1C: Hgb A1c MFr Bld: 8.1 % — ABNORMAL HIGH (ref 4.6–6.5)

## 2015-06-18 ENCOUNTER — Ambulatory Visit (INDEPENDENT_AMBULATORY_CARE_PROVIDER_SITE_OTHER): Payer: Commercial Managed Care - HMO | Admitting: Internal Medicine

## 2015-06-18 ENCOUNTER — Encounter: Payer: Self-pay | Admitting: Internal Medicine

## 2015-06-18 VITALS — BP 120/68 | HR 86 | Wt 257.0 lb

## 2015-06-18 DIAGNOSIS — M545 Low back pain, unspecified: Secondary | ICD-10-CM

## 2015-06-18 DIAGNOSIS — M722 Plantar fascial fibromatosis: Secondary | ICD-10-CM

## 2015-06-18 DIAGNOSIS — E118 Type 2 diabetes mellitus with unspecified complications: Secondary | ICD-10-CM

## 2015-06-18 DIAGNOSIS — I4892 Unspecified atrial flutter: Secondary | ICD-10-CM | POA: Diagnosis not present

## 2015-06-18 DIAGNOSIS — F411 Generalized anxiety disorder: Secondary | ICD-10-CM

## 2015-06-18 DIAGNOSIS — R635 Abnormal weight gain: Secondary | ICD-10-CM | POA: Diagnosis not present

## 2015-06-18 DIAGNOSIS — G8929 Other chronic pain: Secondary | ICD-10-CM

## 2015-06-18 MED ORDER — GLIMEPIRIDE 1 MG PO TABS: 1.0000 mg | ORAL_TABLET | Freq: Every day | ORAL | Status: DC

## 2015-06-18 NOTE — Assessment & Plan Note (Signed)
Metformin 

## 2015-06-18 NOTE — Assessment & Plan Note (Signed)
Chronic  Temazepam prn  Potential benefits of a long term prn benzodiazepines  use as well as potential risks  and complications were explained to the patient and were aknowledged.

## 2015-06-18 NOTE — Patient Instructions (Signed)
Ice the heel Stretch foot

## 2015-06-18 NOTE — Assessment & Plan Note (Signed)
Chronic - worse Norco prn  Potential benefits of a long term opioids use as well as potential risks (i.e. addiction risk, apnea etc) and complications (i.e. Somnolence, constipation and others) were explained to the patient and were aknowledged.

## 2015-06-18 NOTE — Progress Notes (Signed)
Subjective:  Patient ID: Jessica [name], female    DOB: 1933/10/14  Age: 82 y.o. MRN: [id number]  CC: No chief complaint on file.   HPI [name] presents for R heel pain, A flutter, DM2, HTN f/u  Outpatient Prescriptions Prior to Visit  Medication Sig Dispense Refill  . allopurinol (ZYLOPRIM) 100 MG tablet Take 1 tablet (100 mg total) by mouth daily. 90 tablet 0  . apixaban (ELIQUIS) 5 MG TABS tablet Take 5 mg by mouth 2 (two) times daily.    [name] b complex vitamins tablet Take 1 tablet by mouth daily.    . blood glucose meter kit and supplies KIT Use to test blood sugar up to three times a day, as directed by physician Dx:E11.09 1 each 0  . Cholecalciferol 1000 UNITS tablet Take 1,000 Units by mouth daily.      [name] CINNAMON PO Take 1 tablet by mouth daily.    . COD LIVER OIL PO Take 2 capsules by mouth daily.    . furosemide (LASIX) 20 MG tablet Take 20 mg by mouth daily.     [name] glucose blood test strip Use as directed to test blood sugar up to three times a day, as directed by physician. E11.09 100 each 12  . HYDROcodone-acetaminophen (NORCO) 7.5-325 MG tablet Take 1 tablet by mouth every 6 (six) hours as needed for severe pain. 120 tablet 0  . Lancets MISC Use as directed to test blood sugar up to three times a day, as directed by physician. E11.09 300 each 1  . levothyroxine (SYNTHROID, LEVOTHROID) 75 MCG tablet TAKE 1 TABLET EVERY DAY 90 tablet 3  . LORazepam (ATIVAN) 0.5 MG tablet Take 1 tablet (0.5 mg total) by mouth 2 (two) times daily as needed for anxiety. 180 tablet 1  . losartan (COZAAR) 100 MG tablet TAKE 1 TABLET EVERY DAY 90 tablet 3  . metFORMIN (GLUCOPHAGE XR) 750 MG 24 hr tablet Take 1 tablet (750 mg total) by mouth daily with breakfast. 90 tablet 3  . Multiple Vitamins-Minerals (OCUVITE PRESERVISION) TABS Take 1 tablet by mouth 2 (two) times daily.    [name] omeprazole (PRILOSEC) 40 MG capsule TAKE 1 CAPSULE EVERY DAY 90 capsule 3  . temazepam (RESTORIL) 15 MG  capsule Take 1 capsule (15 mg total) by mouth at bedtime as needed for sleep. 90 capsule 0  . triamcinolone ointment (KENALOG) 0.5 % Apply 1 application topically 3 (three) times daily as needed (for skin).    [name] triamterene-hydrochlorothiazide (MAXZIDE-25) 37.5-25 MG tablet TAKE 1 TABLET EVERY DAY 90 tablet 3  . venlafaxine XR (EFFEXOR-XR) 75 MG 24 hr capsule TAKE 1 CAPSULE EVERY DAY WITH BREAKFAST 90 capsule 3  . venlafaxine (EFFEXOR) 75 MG tablet Take 75 mg by mouth daily with breakfast. Reported on 06/18/2015     No facility-administered medications prior to visit.    ROS Review of Systems  Constitutional: Positive for fatigue. Negative for chills, activity change, appetite change and unexpected weight change.  HENT: Negative for congestion, mouth sores and sinus pressure.   Eyes: Negative for visual disturbance.  Respiratory: Negative for cough and chest tightness.   Gastrointestinal: Negative for nausea and abdominal pain.  Genitourinary: Negative for frequency, difficulty urinating and vaginal pain.  Musculoskeletal: Positive for back pain, arthralgias and gait problem.  Skin: Negative for pallor and rash.  Neurological: Negative for dizziness, tremors, weakness, numbness and headaches.  Psychiatric/Behavioral: Negative for confusion and sleep disturbance. The patient is not nervous/anxious.  Objective:  BP 120/68 mmHg  Pulse 86  Wt 257 lb (116.574 kg)  SpO2 94%  BP Readings from Last 3 Encounters:  06/18/15 120/68  03/17/15 130/70  03/03/15 124/72    Wt Readings from Last 3 Encounters:  06/18/15 257 lb (116.574 kg)  03/17/15 259 lb (117.482 kg)  03/03/15 261 lb 8 oz (118.616 kg)    Physical Exam  Constitutional: She appears well-developed. No distress.  HENT:  Head: Normocephalic.  Right Ear: External ear normal.  Left Ear: External ear normal.  Nose: Nose normal.  Mouth/Throat: Oropharynx is clear and moist.  Eyes: Conjunctivae are normal. Pupils are equal,  round, and reactive to light. Right eye exhibits no discharge. Left eye exhibits no discharge.  Neck: Normal range of motion. Neck supple. No JVD present. No tracheal deviation present. No thyromegaly present.  Cardiovascular: Normal rate, regular rhythm and normal heart sounds.   Pulmonary/Chest: No stridor. No respiratory distress. She has no wheezes.  Abdominal: Soft. Bowel sounds are normal. She exhibits no distension and no mass. There is no tenderness. There is no rebound and no guarding.  Musculoskeletal: She exhibits tenderness. She exhibits no edema.  Lymphadenopathy:    She has no cervical adenopathy.  Neurological: She displays normal reflexes. No cranial nerve deficit. She exhibits normal muscle tone. Coordination normal.  Skin: No rash noted. No erythema.  Psychiatric: She has a normal mood and affect. Her behavior is normal. Judgment and thought content normal.  Obese R heel is tender  Lab Results  Component Value Date   WBC 8.8 09/16/2014   HGB 14.4 09/16/2014   HCT 43.1 09/16/2014   PLT 268 09/16/2014   GLUCOSE 175* 06/14/2015   CHOL 210* 10/10/2012   TRIG 289* 10/10/2012   HDL 39* 10/10/2012   LDLDIRECT 155.9 09/30/2012   LDLCALC 113* 10/10/2012   ALT 25 09/16/2014   AST 32 09/16/2014   NA 137 06/14/2015   K 4.0 06/14/2015   CL 98 06/14/2015   CREATININE 0.82 06/14/2015   BUN 16 06/14/2015   CO2 29 06/14/2015   TSH 3.592 03/03/2015   INR 3.3 08/26/2009   HGBA1C 8.1* 06/14/2015   MICROALBUR 4.5* 12/02/2009    No results found.  Assessment & Plan:   There are no diagnoses linked to this encounter. I have discontinued Ms. Hillenburg's venlafaxine. I am also having her maintain her Cholecalciferol, COD LIVER OIL PO, OCUVITE PRESERVISION, b complex vitamins, CINNAMON PO, blood glucose meter kit and supplies, glucose blood, Lancets, apixaban, furosemide, triamcinolone ointment, omeprazole, triamterene-hydrochlorothiazide, LORazepam, metFORMIN, losartan,  temazepam, allopurinol, HYDROcodone-acetaminophen, levothyroxine, and venlafaxine XR.  No orders of the defined types were placed in this encounter.     Follow-up: No Follow-up on file.  Walker Kehr, MD

## 2015-06-18 NOTE — Assessment & Plan Note (Signed)
Discussed Rx Ice, stretch

## 2015-06-18 NOTE — Assessment & Plan Note (Signed)
Lost 2 lbs

## 2015-06-18 NOTE — Progress Notes (Signed)
Pre visit review using our clinic review tool, if applicable. No additional management support is needed unless otherwise documented below in the visit note. 

## 2015-06-18 NOTE — Assessment & Plan Note (Signed)
Dr Allred On Eliquis Pacemaker 

## 2015-06-29 ENCOUNTER — Ambulatory Visit (INDEPENDENT_AMBULATORY_CARE_PROVIDER_SITE_OTHER): Payer: Commercial Managed Care - HMO | Admitting: *Deleted

## 2015-06-29 ENCOUNTER — Telehealth: Payer: Self-pay | Admitting: Cardiology

## 2015-06-29 DIAGNOSIS — I441 Atrioventricular block, second degree: Secondary | ICD-10-CM

## 2015-06-29 NOTE — Telephone Encounter (Signed)
Spoke with pt and reminded pt of remote transmission that is due today. Pt verbalized understanding.   

## 2015-06-29 NOTE — Progress Notes (Signed)
Remote pacemaker transmission.   

## 2015-07-08 LAB — CUP PACEART REMOTE DEVICE CHECK
Battery Voltage: 2.79 V
Brady Statistic AP VS Percent: 0 %
Brady Statistic AS VS Percent: 2 %
Implantable Lead Implant Date: 20140710
Lead Channel Impedance Value: 747 Ohm
Lead Channel Pacing Threshold Pulse Width: 0.4 ms
Lead Channel Pacing Threshold Pulse Width: 0.4 ms
Lead Channel Setting Pacing Amplitude: 2 V
Lead Channel Setting Pacing Amplitude: 2.5 V
Lead Channel Setting Sensing Sensitivity: 4 mV
MDC IDC LEAD IMPLANT DT: 20140710
MDC IDC LEAD LOCATION: 753859
MDC IDC LEAD LOCATION: 753860
MDC IDC LEAD MODEL: 5592
MDC IDC MSMT BATTERY IMPEDANCE: 159 Ohm
MDC IDC MSMT BATTERY REMAINING LONGEVITY: 127 mo
MDC IDC MSMT LEADCHNL RA IMPEDANCE VALUE: 596 Ohm
MDC IDC MSMT LEADCHNL RA PACING THRESHOLD AMPLITUDE: 0.625 V
MDC IDC MSMT LEADCHNL RA SENSING INTR AMPL: 0.7 mV
MDC IDC MSMT LEADCHNL RV PACING THRESHOLD AMPLITUDE: 0.375 V
MDC IDC SESS DTM: 20170328161219
MDC IDC SET LEADCHNL RV PACING PULSEWIDTH: 0.4 ms
MDC IDC STAT BRADY AP VP PERCENT: 2 %
MDC IDC STAT BRADY AS VP PERCENT: 96 %

## 2015-07-13 ENCOUNTER — Encounter: Payer: Self-pay | Admitting: Cardiology

## 2015-07-13 ENCOUNTER — Other Ambulatory Visit: Payer: Self-pay | Admitting: Internal Medicine

## 2015-07-26 ENCOUNTER — Telehealth: Payer: Self-pay | Admitting: Internal Medicine

## 2015-07-26 NOTE — Telephone Encounter (Signed)
Patient Name: Jessica [name]  DOB: Apr 25, 1933    Initial Comment Caller states she had a bad day Saturday, fast heartbeat, has pacemaker. Today feels okay.   Nurse Assessment  Nurse: Verlin Fester RN, Stanton Kidney Date/Time (Eastern Time): 07/26/2015 9:58:31 AM  Confirm and document reason for call. If symptomatic, describe symptoms. You must click the next button to save text entered. ---Patient states she had a bad day Saturday, fast heartbeat 99, has pacemaker. Today feels okay.  Has the patient traveled out of the country within the last 30 days? ---No  Does the patient have any new or worsening symptoms? ---Yes  Will a triage be completed? ---Yes  Related visit to physician within the last 2 weeks? ---No  Does the PT have any chronic conditions? (i.e. diabetes, asthma, etc.) ---Yes  List chronic conditions. ---"diabetes, pacemaker, heart, thyroid, HTN, gout,  Is this a behavioral health or substance abuse call? ---No     Guidelines    Guideline Title Affirmed Question Affirmed Notes  Heart Rate and Heartbeat Questions Age > 60 years (Exception: brief heart beat symptoms that went away and now feels well)    Final Disposition User   See Physician within 4 Hours (or PCP triage) Verlin Fester, RN, Stanton Kidney    Comments  Offered appt and patient refused, states she doesn't want to go in to be seen until she has symptoms again   Referrals  GO TO FACILITY REFUSED   Disagree/Comply: Disagree  Disagree/Comply Reason: Disagree with instructions

## 2015-08-04 ENCOUNTER — Telehealth: Payer: Self-pay | Admitting: *Deleted

## 2015-08-04 NOTE — Telephone Encounter (Signed)
Received call pt is requesting refill on her Hydrocodone...Johny Chess

## 2015-08-04 NOTE — Telephone Encounter (Signed)
OK to fill this prescription with additional refills x0 Thank you!  

## 2015-08-05 MED ORDER — HYDROCODONE-ACETAMINOPHEN 7.5-325 MG PO TABS: 1.0000 | ORAL_TABLET | Freq: Four times a day (QID) | ORAL | Status: DC | PRN

## 2015-08-05 NOTE — Telephone Encounter (Signed)
Notified pt rx ready for pick-up.../lmb 

## 2015-08-09 ENCOUNTER — Other Ambulatory Visit: Payer: Self-pay | Admitting: Internal Medicine

## 2015-08-09 NOTE — Telephone Encounter (Signed)
Pt left msg needing refills sent on her Lorazepam & Temazepam to [facility]...Johny Chess

## 2015-08-10 MED ORDER — LORAZEPAM 0.5 MG PO TABS: 0.5000 mg | ORAL_TABLET | Freq: Two times a day (BID) | ORAL | Status: DC | PRN

## 2015-08-10 MED ORDER — TEMAZEPAM 15 MG PO CAPS: 15.0000 mg | ORAL_CAPSULE | Freq: Every evening | ORAL | Status: DC | PRN

## 2015-08-10 NOTE — Telephone Encounter (Signed)
Refill called into [facility] gave authorization to cheryl/pharmacist.../lmb

## 2015-09-02 ENCOUNTER — Other Ambulatory Visit: Payer: Self-pay | Admitting: Internal Medicine

## 2015-09-13 ENCOUNTER — Other Ambulatory Visit (INDEPENDENT_AMBULATORY_CARE_PROVIDER_SITE_OTHER): Payer: Commercial Managed Care - HMO

## 2015-09-13 DIAGNOSIS — E118 Type 2 diabetes mellitus with unspecified complications: Secondary | ICD-10-CM

## 2015-09-13 DIAGNOSIS — I10 Essential (primary) hypertension: Secondary | ICD-10-CM | POA: Diagnosis not present

## 2015-09-13 LAB — BASIC METABOLIC PANEL
BUN: 20 mg/dL (ref 6–23)
CALCIUM: 9.8 mg/dL (ref 8.4–10.5)
CO2: 26 meq/L (ref 19–32)
Chloride: 99 mEq/L (ref 96–112)
Creatinine, Ser: 0.87 mg/dL (ref 0.40–1.20)
GFR: 66.59 mL/min (ref >60.00)
Glucose, Bld: 225 mg/dL — ABNORMAL HIGH (ref 70–99)
Potassium: 4 mEq/L (ref 3.5–5.1)
SODIUM: 136 meq/L (ref 135–145)

## 2015-09-13 LAB — HEMOGLOBIN A1C: Hgb A1c MFr Bld: 7.3 % — ABNORMAL HIGH (ref 4.6–6.5)

## 2015-09-17 ENCOUNTER — Encounter: Payer: Self-pay | Admitting: Internal Medicine

## 2015-09-17 ENCOUNTER — Ambulatory Visit (INDEPENDENT_AMBULATORY_CARE_PROVIDER_SITE_OTHER): Payer: Commercial Managed Care - HMO | Admitting: Internal Medicine

## 2015-09-17 VITALS — BP 128/80 | HR 87 | Wt 257.0 lb

## 2015-09-17 DIAGNOSIS — I10 Essential (primary) hypertension: Secondary | ICD-10-CM

## 2015-09-17 DIAGNOSIS — R5382 Chronic fatigue, unspecified: Secondary | ICD-10-CM

## 2015-09-17 DIAGNOSIS — G8929 Other chronic pain: Secondary | ICD-10-CM

## 2015-09-17 DIAGNOSIS — E118 Type 2 diabetes mellitus with unspecified complications: Secondary | ICD-10-CM | POA: Diagnosis not present

## 2015-09-17 DIAGNOSIS — I4892 Unspecified atrial flutter: Secondary | ICD-10-CM

## 2015-09-17 DIAGNOSIS — M545 Low back pain, unspecified: Secondary | ICD-10-CM

## 2015-09-17 DIAGNOSIS — R5383 Other fatigue: Secondary | ICD-10-CM | POA: Insufficient documentation

## 2015-09-17 MED ORDER — GLIMEPIRIDE 1 MG PO TABS: 1.0000 mg | ORAL_TABLET | Freq: Two times a day (BID) | ORAL | Status: DC

## 2015-09-17 NOTE — Progress Notes (Signed)
Pre visit review using our clinic review tool, if applicable. No additional management support is needed unless otherwise documented below in the visit note. 

## 2015-09-17 NOTE — Assessment & Plan Note (Signed)
Triamt/HCTZ, Losartan 

## 2015-09-17 NOTE — Assessment & Plan Note (Signed)
Metformin 

## 2015-09-17 NOTE — Progress Notes (Signed)
Subjective:  Patient ID: Jessica [name], female    DOB: Jul 04, 1933  Age: 82 y.o. MRN: [id number]  CC: No chief complaint on file.   HPI JERALD VILLALONA presents for gout, fatigue, DM, HTN, A fib f/u C/o R thumb burn 1-2 mo ago  Outpatient Prescriptions Prior to Visit  Medication Sig Dispense Refill  . allopurinol (ZYLOPRIM) 100 MG tablet TAKE 1 TABLET (100 MG TOTAL) BY MOUTH DAILY. 90 tablet 1  . b complex vitamins tablet Take 1 tablet by mouth daily.    . blood glucose meter kit and supplies KIT Use to test blood sugar up to three times a day, as directed by physician Dx:E11.09 1 each 0  . Cholecalciferol 1000 UNITS tablet Take 1,000 Units by mouth daily.      . furosemide (LASIX) 20 MG tablet Take 20 mg by mouth daily.     [name] glimepiride (AMARYL) 1 MG tablet Take 1 tablet (1 mg total) by mouth daily with breakfast. 90 tablet 3  . glucose blood test strip Use as directed to test blood sugar up to three times a day, as directed by physician. E11.09 100 each 12  . HYDROcodone-acetaminophen (NORCO) 7.5-325 MG tablet Take 1 tablet by mouth every 6 (six) hours as needed for severe pain. 120 tablet 0  . Lancets MISC Use as directed to test blood sugar up to three times a day, as directed by physician. E11.09 300 each 1  . levothyroxine (SYNTHROID, LEVOTHROID) 75 MCG tablet TAKE 1 TABLET EVERY DAY 90 tablet 3  . LORazepam (ATIVAN) 0.5 MG tablet Take 1 tablet (0.5 mg total) by mouth 2 (two) times daily as needed for anxiety. 180 tablet 1  . losartan (COZAAR) 100 MG tablet TAKE 1 TABLET EVERY DAY 90 tablet 3  . metFORMIN (GLUCOPHAGE XR) 750 MG 24 hr tablet Take 1 tablet (750 mg total) by mouth daily with breakfast. 90 tablet 3  . Multiple Vitamins-Minerals (OCUVITE PRESERVISION) TABS Take 1 tablet by mouth 2 (two) times daily.    [name] omeprazole (PRILOSEC) 40 MG capsule TAKE 1 CAPSULE EVERY DAY 90 capsule 3  . temazepam (RESTORIL) 15 MG capsule Take 1 capsule (15 mg total) by mouth at bedtime  as needed for sleep. 90 capsule 0  . triamcinolone ointment (KENALOG) 0.5 % APPLY TO AFFECTED AREA(S) THREE TIMES DAILY 45 g 1  . triamterene-hydrochlorothiazide (MAXZIDE-25) 37.5-25 MG tablet TAKE 1 TABLET EVERY DAY 90 tablet 3  . venlafaxine XR (EFFEXOR-XR) 75 MG 24 hr capsule TAKE 1 CAPSULE EVERY DAY WITH BREAKFAST 90 capsule 3  . apixaban (ELIQUIS) 5 MG TABS tablet Take 5 mg by mouth 2 (two) times daily.     No facility-administered medications prior to visit.    ROS Review of Systems  Constitutional: Positive for fatigue. Negative for chills, activity change, appetite change and unexpected weight change.  HENT: Negative for congestion, mouth sores and sinus pressure.   Eyes: Negative for visual disturbance.  Respiratory: Negative for cough and chest tightness.   Gastrointestinal: Negative for nausea and abdominal pain.  Genitourinary: Negative for frequency, difficulty urinating and vaginal pain.  Musculoskeletal: Positive for arthralgias and gait problem. Negative for back pain.  Skin: Negative for pallor and rash.  Neurological: Negative for dizziness, tremors, weakness, numbness and headaches.  Psychiatric/Behavioral: Negative for suicidal ideas, confusion and sleep disturbance.    Objective:  BP 128/80 mmHg  Pulse 87  Wt 257 lb (116.574 kg)  SpO2 97%  BP Readings from Last 3  Encounters:  09/17/15 128/80  06/18/15 120/68  03/17/15 130/70    Wt Readings from Last 3 Encounters:  09/17/15 257 lb (116.574 kg)  06/18/15 257 lb (116.574 kg)  03/17/15 259 lb (117.482 kg)    Physical Exam  Constitutional: She appears well-developed. No distress.  HENT:  Head: Normocephalic.  Right Ear: External ear normal.  Left Ear: External ear normal.  Nose: Nose normal.  Mouth/Throat: Oropharynx is clear and moist.  Eyes: Conjunctivae are normal. Pupils are equal, round, and reactive to light. Right eye exhibits no discharge. Left eye exhibits no discharge.  Neck: Normal range of  motion. Neck supple. No JVD present. No tracheal deviation present. No thyromegaly present.  Cardiovascular: Normal rate, regular rhythm and normal heart sounds.   Pulmonary/Chest: No stridor. No respiratory distress. She has no wheezes.  Abdominal: Soft. Bowel sounds are normal. She exhibits no distension and no mass. There is no tenderness. There is no rebound and no guarding.  Musculoskeletal: She exhibits no edema or tenderness.  Lymphadenopathy:    She has no cervical adenopathy.  Neurological: She displays normal reflexes. No cranial nerve deficit. She exhibits normal muscle tone. Coordination normal.  Skin: No rash noted. No erythema.  Psychiatric: She has a normal mood and affect. Her behavior is normal. Judgment and thought content normal.  R thumb is deformed - prox nail  Lab Results  Component Value Date   WBC 8.8 09/16/2014   HGB 14.4 09/16/2014   HCT 43.1 09/16/2014   PLT 268 09/16/2014   GLUCOSE 225* 09/13/2015   CHOL 210* 10/10/2012   TRIG 289* 10/10/2012   HDL 39* 10/10/2012   LDLDIRECT 155.9 09/30/2012   LDLCALC 113* 10/10/2012   ALT 25 09/16/2014   AST 32 09/16/2014   NA 136 09/13/2015   K 4.0 09/13/2015   CL 99 09/13/2015   CREATININE 0.87 09/13/2015   BUN 20 09/13/2015   CO2 26 09/13/2015   TSH 3.592 03/03/2015   INR 3.3 08/26/2009   HGBA1C 7.3* 09/13/2015   MICROALBUR 4.5* 12/02/2009    No results found.  Assessment & Plan:   There are no diagnoses linked to this encounter. I am having Ms. Travaglini maintain her Cholecalciferol, OCUVITE PRESERVISION, b complex vitamins, blood glucose meter kit and supplies, glucose blood, Lancets, furosemide, omeprazole, triamterene-hydrochlorothiazide, metFORMIN, losartan, levothyroxine, venlafaxine XR, glimepiride, allopurinol, HYDROcodone-acetaminophen, temazepam, LORazepam, triamcinolone ointment, and apixaban.  Meds ordered this encounter  Medications  . apixaban (ELIQUIS) 5 MG TABS tablet    Sig: Take 5 mg by  mouth 2 (two) times daily.     Follow-up: No Follow-up on file.  Walker Kehr, MD

## 2015-09-17 NOTE — Assessment & Plan Note (Signed)
Wt Readings from Last 3 Encounters:  09/17/15 257 lb (116.574 kg)  06/18/15 257 lb (116.574 kg)  03/17/15 259 lb (117.482 kg)

## 2015-09-17 NOTE — Assessment & Plan Note (Signed)
Chronic - multifactorial

## 2015-09-17 NOTE — Assessment & Plan Note (Signed)
On Eliquis Pacemaker

## 2015-09-17 NOTE — Assessment & Plan Note (Signed)
F/u w/cardiology pending labs

## 2015-09-28 ENCOUNTER — Ambulatory Visit (INDEPENDENT_AMBULATORY_CARE_PROVIDER_SITE_OTHER): Payer: Commercial Managed Care - HMO | Admitting: *Deleted

## 2015-09-28 ENCOUNTER — Telehealth: Payer: Self-pay | Admitting: Cardiology

## 2015-09-28 DIAGNOSIS — I441 Atrioventricular block, second degree: Secondary | ICD-10-CM

## 2015-09-28 NOTE — Progress Notes (Signed)
Remote pacemaker transmission.   

## 2015-09-28 NOTE — Telephone Encounter (Signed)
Spoke with pt and reminded pt of remote transmission that is due today. Pt verbalized understanding.   

## 2015-09-29 ENCOUNTER — Encounter: Payer: Self-pay | Admitting: Cardiology

## 2015-09-29 LAB — CUP PACEART REMOTE DEVICE CHECK
Battery Impedance: 183 Ohm
Battery Remaining Longevity: 123 mo
Brady Statistic AP VP Percent: 2 %
Brady Statistic AP VS Percent: 0 %
Implantable Lead Implant Date: 20140710
Implantable Lead Implant Date: 20140710
Implantable Lead Location: 753859
Implantable Lead Location: 753860
Implantable Lead Model: 5092
Implantable Lead Model: 5592
Lead Channel Impedance Value: 518 Ohm
Lead Channel Impedance Value: 788 Ohm
Lead Channel Pacing Threshold Amplitude: 0.375 V
Lead Channel Pacing Threshold Amplitude: 0.625 V
Lead Channel Pacing Threshold Pulse Width: 0.4 ms
Lead Channel Pacing Threshold Pulse Width: 0.4 ms
Lead Channel Sensing Intrinsic Amplitude: 0.7 mV
Lead Channel Setting Pacing Pulse Width: 0.4 ms
Lead Channel Setting Sensing Sensitivity: 4 mV
MDC IDC MSMT BATTERY VOLTAGE: 2.79 V
MDC IDC SESS DTM: 20170627160455
MDC IDC SET LEADCHNL RA PACING AMPLITUDE: 2 V
MDC IDC SET LEADCHNL RV PACING AMPLITUDE: 2.5 V
MDC IDC STAT BRADY AS VP PERCENT: 95 %
MDC IDC STAT BRADY AS VS PERCENT: 3 %

## 2015-10-11 ENCOUNTER — Other Ambulatory Visit: Payer: Self-pay

## 2015-10-11 NOTE — Telephone Encounter (Signed)
Pt lm on triage: rq rf for Hydrocodone. Rx pended.  Pls advise

## 2015-10-11 NOTE — Telephone Encounter (Signed)
OK to fill this prescription with additional refills x0 Thank you!  

## 2015-10-12 MED ORDER — HYDROCODONE-ACETAMINOPHEN 7.5-325 MG PO TABS
1.0000 | ORAL_TABLET | Freq: Four times a day (QID) | ORAL | Status: DC | PRN
Start: 2015-10-12 — End: 2015-12-16

## 2015-10-12 NOTE — Telephone Encounter (Signed)
Notified pt rx ready for pick-up.../lmb 

## 2015-10-12 NOTE — Telephone Encounter (Signed)
rx printed for pcp

## 2015-10-21 ENCOUNTER — Other Ambulatory Visit: Payer: Self-pay | Admitting: *Deleted

## 2015-10-21 MED ORDER — GLUCOSE BLOOD VI STRP: 1.0000 | ORAL_STRIP | Freq: Three times a day (TID) | Status: DC

## 2015-10-21 MED ORDER — FUROSEMIDE 20 MG PO TABS: 20.0000 mg | ORAL_TABLET | Freq: Every day | ORAL | Status: DC

## 2015-10-21 NOTE — Telephone Encounter (Signed)
Rf req for Temazepam to be printed/faxed to [facility] @ [phone]. Please advise ok to Rf?

## 2015-10-25 MED ORDER — TEMAZEPAM 15 MG PO CAPS: 15.0000 mg | ORAL_CAPSULE | Freq: Every evening | ORAL | 3 refills | Status: DC | PRN

## 2015-10-26 MED ORDER — TEMAZEPAM 15 MG PO CAPS: 15.0000 mg | ORAL_CAPSULE | Freq: Every evening | ORAL | 3 refills | Status: DC | PRN
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
## 2015-10-26 NOTE — Addendum Note (Signed)
Addended by: Cresenciano Lick on: 10/26/2015 08:41 AM   Modules accepted: Orders

## 2015-10-26 NOTE — Telephone Encounter (Signed)
Rx printed/faxed to [facility].

## 2015-10-27 ENCOUNTER — Ambulatory Visit (INDEPENDENT_AMBULATORY_CARE_PROVIDER_SITE_OTHER): Payer: Commercial Managed Care - HMO

## 2015-10-27 VITALS — BP 138/80 | Ht 62.0 in | Wt 258.5 lb

## 2015-10-27 DIAGNOSIS — Z0000 Encounter for general adult medical examination without abnormal findings: Secondary | ICD-10-CM

## 2015-10-27 NOTE — Patient Instructions (Addendum)
Jessica [name] , Thank you for taking time to come for your Medicare Wellness Visit. I appreciate your ongoing commitment to your health goals. Please review the following plan we discussed and let me know if I can assist you in the future.   Call the [facility] and read the 36 hour day  Check with Dr. Alain Marion regarding Shingles vaccine  Try to do water aerobics x 2 a week   To schedule eye exam   Instructed to fup with Dr. Alain Marion for cat scratches to both lower legs; will clean at home and monitor for s/s of infection   These are the goals we discussed: Goals    . Exercise 3x per week (30 min per time)          Agrees to go to go to the [facility] and start water aerobic tiw Agrees to watch carbs; last A1c 6.8  Will monitor portions    . paient           Stay joyful and healthy       This is a list of the screening recommended for you and due dates:  Health Maintenance  Topic Date Due  . Eye exam for diabetics  09/17/1943  . Shingles Vaccine  09/18/1995  . DEXA scan (bone density measurement)  09/17/2000  . Complete foot exam   11/11/2013  . Flu Shot  11/02/2015  . Hemoglobin A1C  03/14/2016  . Tetanus Vaccine  10/13/2024  . Pneumonia vaccines  Completed       Fall Prevention in the Home  Falls can cause injuries. They can happen to people of all ages. There are many things you can do to make your home safe and to help prevent falls.  WHAT CAN I DO ON THE OUTSIDE OF MY HOME?  Regularly fix the edges of walkways and driveways and fix any cracks.  Remove anything that might make you trip as you walk through a door, such as a raised step or threshold.  Trim any bushes or trees on the path to your home.  Use bright outdoor lighting.  Clear any walking paths of anything that might make someone trip, such as rocks or tools.  Regularly check to see if handrails are loose or broken. Make sure that both sides of any steps have handrails.  Any raised decks and  porches should have guardrails on the edges.  Have any leaves, snow, or ice cleared regularly.  Use sand or salt on walking paths during winter.  Clean up any spills in your garage right away. This includes oil or grease spills. WHAT CAN I DO IN THE BATHROOM?   Use night lights.  Install grab bars by the toilet and in the tub and shower. Do not use towel bars as grab bars.  Use non-skid mats or decals in the tub or shower.  If you need to sit down in the shower, use a plastic, non-slip stool.  Keep the floor dry. Clean up any water that spills on the floor as soon as it happens.  Remove soap buildup in the tub or shower regularly.  Attach bath mats securely with double-sided non-slip rug tape.  Do not have throw rugs and other things on the floor that can make you trip. WHAT CAN I DO IN THE BEDROOM?  Use night lights.  Make sure that you have a light by your bed that is easy to reach.  Do not use any sheets or blankets that are too  big for your bed. They should not hang down onto the floor.  Have a firm chair that has side arms. You can use this for support while you get dressed.  Do not have throw rugs and other things on the floor that can make you trip. WHAT CAN I DO IN THE KITCHEN?  Clean up any spills right away.  Avoid walking on wet floors.  Keep items that you use a lot in easy-to-reach places.  If you need to reach something above you, use a strong step stool that has a grab bar.  Keep electrical cords out of the way.  Do not use floor polish or wax that makes floors slippery. If you must use wax, use non-skid floor wax.  Do not have throw rugs and other things on the floor that can make you trip. WHAT CAN I DO WITH MY STAIRS?  Do not leave any items on the stairs.  Make sure that there are handrails on both sides of the stairs and use them. Fix handrails that are broken or loose. Make sure that handrails are as long as the stairways.  Check any  carpeting to make sure that it is firmly attached to the stairs. Fix any carpet that is loose or worn.  Avoid having throw rugs at the top or bottom of the stairs. If you do have throw rugs, attach them to the floor with carpet tape.  Make sure that you have a light switch at the top of the stairs and the bottom of the stairs. If you do not have them, ask someone to add them for you. WHAT ELSE CAN I DO TO HELP PREVENT FALLS?  Wear shoes that:  Do not have high heels.  Have rubber bottoms.  Are comfortable and fit you well.  Are closed at the toe. Do not wear sandals.  If you use a stepladder:  Make sure that it is fully opened. Do not climb a closed stepladder.  Make sure that both sides of the stepladder are locked into place.  Ask someone to hold it for you, if possible.  Clearly mark and make sure that you can see:  Any grab bars or handrails.  First and last steps.  Where the edge of each step is.  Use tools that help you move around (mobility aids) if they are needed. These include:  Canes.  Walkers.  Scooters.  Crutches.  Turn on the lights when you go into a dark area. Replace any light bulbs as soon as they burn out.  Set up your furniture so you have a clear path. Avoid moving your furniture around.  If any of your floors are uneven, fix them.  If there are any pets around you, be aware of where they are.  Review your medicines with your doctor. Some medicines can make you feel dizzy. This can increase your chance of falling. Ask your doctor what other things that you can do to help prevent falls.   This information is not intended to replace advice given to you by your health care provider. Make sure you discuss any questions you have with your health care provider.   Document Released: 01/14/2009 Document Revised: 08/04/2014 Document Reviewed: 04/24/2014 Elsevier Interactive Patient Education 2016 [employer].  Health Maintenance,  Female Adopting a healthy lifestyle and getting preventive care can go a long way to promote health and wellness. Talk with your health care provider about what schedule of regular examinations is right for you. This  is a good chance for you to check in with your provider about disease prevention and staying healthy. In between checkups, there are plenty of things you can do on your own. Experts have done a lot of research about which lifestyle changes and preventive measures are most likely to keep you healthy. Ask your health care provider for more information. WEIGHT AND DIET  Eat a healthy diet  Be sure to include plenty of vegetables, fruits, low-fat dairy products, and lean protein.  Do not eat a lot of foods high in solid fats, added sugars, or salt.  Get regular exercise. This is one of the most important things you can do for your health.  Most adults should exercise for at least 150 minutes each week. The exercise should increase your heart rate and make you sweat (moderate-intensity exercise).  Most adults should also do strengthening exercises at least twice a week. This is in addition to the moderate-intensity exercise.  Maintain a healthy weight  Body mass index (BMI) is a measurement that can be used to identify possible weight problems. It estimates body fat based on height and weight. Your health care provider can help determine your BMI and help you achieve or maintain a healthy weight.  For females 53 years of age and older:   A BMI below 18.5 is considered underweight.  A BMI of 18.5 to 24.9 is normal.  A BMI of 25 to 29.9 is considered overweight.  A BMI of 30 and above is considered obese.  Watch levels of cholesterol and blood lipids  You should start having your blood tested for lipids and cholesterol at 80 years of age, then have this test every 5 years.  You may need to have your cholesterol levels checked more often if:  Your lipid or cholesterol levels  are high.  You are older than 80 years of age.  You are at high risk for heart disease.  CANCER SCREENING   Lung Cancer  Lung cancer screening is recommended for adults 73-76 years old who are at high risk for lung cancer because of a history of smoking.  A yearly low-dose CT scan of the lungs is recommended for people who:  Currently smoke.  Have quit within the past 15 years.  Have at least a 30-pack-year history of smoking. A pack year is smoking an average of one pack of cigarettes a day for 1 year.  Yearly screening should continue until it has been 15 years since you quit.  Yearly screening should stop if you develop a health problem that would prevent you from having lung cancer treatment.  Breast Cancer  Practice breast self-awareness. This means understanding how your breasts normally appear and feel.  It also means doing regular breast self-exams. Let your health care provider know about any changes, no matter how small.  If you are in your 20s or 30s, you should have a clinical breast exam (CBE) by a health care provider every 1-3 years as part of a regular health exam.  If you are 52 or older, have a CBE every year. Also consider having a breast X-ray (mammogram) every year.  If you have a family history of breast cancer, talk to your health care provider about genetic screening.  If you are at high risk for breast cancer, talk to your health care provider about having an MRI and a mammogram every year.  Breast cancer gene (BRCA) assessment is recommended for women who have family members with BRCA-related  cancers. BRCA-related cancers include:  Breast.  Ovarian.  Tubal.  Peritoneal cancers.  Results of the assessment will determine the need for genetic counseling and BRCA1 and BRCA2 testing. Cervical Cancer Your health care provider may recommend that you be screened regularly for cancer of the pelvic organs (ovaries, uterus, and vagina). This screening  involves a pelvic examination, including checking for microscopic changes to the surface of your cervix (Pap test). You may be encouraged to have this screening done every 3 years, beginning at age 40.  For women ages 72-65, health care providers may recommend pelvic exams and Pap testing every 3 years, or they may recommend the Pap and pelvic exam, combined with testing for human papilloma virus (HPV), every 5 years. Some types of HPV increase your risk of cervical cancer. Testing for HPV may also be done on women of any age with unclear Pap test results.  Other health care providers may not recommend any screening for nonpregnant women who are considered low risk for pelvic cancer and who do not have symptoms. Ask your health care provider if a screening pelvic exam is right for you.  If you have had past treatment for cervical cancer or a condition that could lead to cancer, you need Pap tests and screening for cancer for at least 20 years after your treatment. If Pap tests have been discontinued, your risk factors (such as having a new sexual partner) need to be reassessed to determine if screening should resume. Some women have medical problems that increase the chance of getting cervical cancer. In these cases, your health care provider may recommend more frequent screening and Pap tests. Colorectal Cancer  This type of cancer can be detected and often prevented.  Routine colorectal cancer screening usually begins at 80 years of age and continues through 80 years of age.  Your health care provider may recommend screening at an earlier age if you have risk factors for colon cancer.  Your health care provider may also recommend using home test kits to check for hidden blood in the stool.  A small camera at the end of a tube can be used to examine your colon directly (sigmoidoscopy or colonoscopy). This is done to check for the earliest forms of colorectal cancer.  Routine screening usually  begins at age 66.  Direct examination of the colon should be repeated every 5-10 years through 80 years of age. However, you may need to be screened more often if early forms of precancerous polyps or small growths are found. Skin Cancer  Check your skin from head to toe regularly.  Tell your health care provider about any new moles or changes in moles, especially if there is a change in a mole's shape or color.  Also tell your health care provider if you have a mole that is larger than the size of a pencil eraser.  Always use sunscreen. Apply sunscreen liberally and repeatedly throughout the day.  Protect yourself by wearing long sleeves, pants, a wide-brimmed hat, and sunglasses whenever you are outside. HEART DISEASE, DIABETES, AND HIGH BLOOD PRESSURE   High blood pressure causes heart disease and increases the risk of stroke. High blood pressure is more likely to develop in:  People who have blood pressure in the high end of the normal range (130-139/85-89 mm Hg).  People who are overweight or obese.  People who are African American.  If you are 61-44 years of age, have your blood pressure checked every 3-5 years. If you  are 50 years of age or older, have your blood pressure checked every year. You should have your blood pressure measured twice--once when you are at a hospital or clinic, and once when you are not at a hospital or clinic. Record the average of the two measurements. To check your blood pressure when you are not at a hospital or clinic, you can use:  An automated blood pressure machine at a pharmacy.  A home blood pressure monitor.  If you are between 80 years and 43 years old, ask your health care provider if you should take aspirin to prevent strokes.  Have regular diabetes screenings. This involves taking a blood sample to check your fasting blood sugar level.  If you are at a normal weight and have a low risk for diabetes, have this test once every three years  after 80 years of age.  If you are overweight and have a high risk for diabetes, consider being tested at a younger age or more often. PREVENTING INFECTION  Hepatitis B  If you have a higher risk for hepatitis B, you should be screened for this virus. You are considered at high risk for hepatitis B if:  You were born in a country where hepatitis B is common. Ask your health care provider which countries are considered high risk.  Your parents were born in a high-risk country, and you have not been immunized against hepatitis B (hepatitis B vaccine).  You have HIV or AIDS.  You use needles to inject street drugs.  You live with someone who has hepatitis B.  You have had sex with someone who has hepatitis B.  You get hemodialysis treatment.  You take certain medicines for conditions, including cancer, organ transplantation, and autoimmune conditions. Hepatitis C  Blood testing is recommended for:  Everyone born from [birth year] through 1963.  Anyone with known risk factors for hepatitis C. Sexually transmitted infections (STIs)  You should be screened for sexually transmitted infections (STIs) including gonorrhea and chlamydia if:  You are sexually active and are younger than 80 years of age.  You are older than 80 years of age and your health care provider tells you that you are at risk for this type of infection.  Your sexual activity has changed since you were last screened and you are at an increased risk for chlamydia or gonorrhea. Ask your health care provider if you are at risk.  If you do not have HIV, but are at risk, it may be recommended that you take a prescription medicine daily to prevent HIV infection. This is called pre-exposure prophylaxis (PrEP). You are considered at risk if:  You are sexually active and do not regularly use condoms or know the HIV status of your partner(s).  You take drugs by injection.  You are sexually active with a partner who has  HIV. Talk with your health care provider about whether you are at high risk of being infected with HIV. If you choose to begin PrEP, you should first be tested for HIV. You should then be tested every 3 months for as long as you are taking PrEP.  PREGNANCY   If you are premenopausal and you may become pregnant, ask your health care provider about preconception counseling.  If you may become pregnant, take 400 to 800 micrograms (mcg) of folic acid every day.  If you want to prevent pregnancy, talk to your health care provider about birth control (contraception). OSTEOPOROSIS AND MENOPAUSE   Osteoporosis is a  disease in which the bones lose minerals and strength with aging. This can result in serious bone fractures. Your risk for osteoporosis can be identified using a bone density scan.  If you are 19 years of age or older, or if you are at risk for osteoporosis and fractures, ask your health care provider if you should be screened.  Ask your health care provider whether you should take a calcium or vitamin D supplement to lower your risk for osteoporosis.  Menopause may have certain physical symptoms and risks.  Hormone replacement therapy may reduce some of these symptoms and risks. Talk to your health care provider about whether hormone replacement therapy is right for you.  HOME CARE INSTRUCTIONS   Schedule regular health, dental, and eye exams.  Stay current with your immunizations.   Do not use any tobacco products including cigarettes, chewing tobacco, or electronic cigarettes.  If you are pregnant, do not drink alcohol.  If you are breastfeeding, limit how much and how often you drink alcohol.  Limit alcohol intake to no more than 1 drink per day for nonpregnant women. One drink equals 12 ounces of beer, 5 ounces of wine, or 1 ounces of hard liquor.  Do not use street drugs.  Do not share needles.  Ask your health care provider for help if you need support or  information about quitting drugs.  Tell your health care provider if you often feel depressed.  Tell your health care provider if you have ever been abused or do not feel safe at home.   This information is not intended to replace advice given to you by your health care provider. Make sure you discuss any questions you have with your health care provider.   Document Released: 10/03/2010 Document Revised: 04/10/2014 Document Reviewed: 02/19/2013 Elsevier Interactive Patient Education [employer].

## 2015-10-29 ENCOUNTER — Ambulatory Visit: Payer: Commercial Managed Care - HMO | Admitting: Adult Health

## 2015-11-05 ENCOUNTER — Telehealth: Payer: Self-pay

## 2015-11-05 NOTE — Telephone Encounter (Signed)
Pt called about the patient assistance forms she dropped with Manuela Schwartz on 10/27/2015.   Forms found and have been signed by MD.   Copies made for chart and pt is picking up the originals.

## 2015-11-29 ENCOUNTER — Other Ambulatory Visit: Payer: Self-pay | Admitting: *Deleted

## 2015-11-29 MED ORDER — APIXABAN 5 MG PO TABS: 5.0000 mg | ORAL_TABLET | Freq: Two times a day (BID) | ORAL | 2 refills | Status: DC

## 2015-12-10 ENCOUNTER — Encounter: Payer: Self-pay | Admitting: Nurse Practitioner

## 2015-12-10 ENCOUNTER — Other Ambulatory Visit (INDEPENDENT_AMBULATORY_CARE_PROVIDER_SITE_OTHER): Payer: Commercial Managed Care - HMO

## 2015-12-10 ENCOUNTER — Ambulatory Visit (INDEPENDENT_AMBULATORY_CARE_PROVIDER_SITE_OTHER): Payer: Commercial Managed Care - HMO | Admitting: Nurse Practitioner

## 2015-12-10 VITALS — BP 130/78 | HR 80 | Temp 98.2°F | Ht 62.0 in | Wt 253.5 lb

## 2015-12-10 DIAGNOSIS — L03116 Cellulitis of left lower limb: Secondary | ICD-10-CM | POA: Diagnosis not present

## 2015-12-10 DIAGNOSIS — S80812A Abrasion, left lower leg, initial encounter: Secondary | ICD-10-CM | POA: Diagnosis not present

## 2015-12-10 DIAGNOSIS — E118 Type 2 diabetes mellitus with unspecified complications: Secondary | ICD-10-CM

## 2015-12-10 DIAGNOSIS — W5503XA Scratched by cat, initial encounter: Principal | ICD-10-CM

## 2015-12-10 LAB — BASIC METABOLIC PANEL
BUN: 24 mg/dL — AB (ref 6–23)
CHLORIDE: 100 meq/L (ref 96–112)
CO2: 28 mEq/L (ref 19–32)
Calcium: 9.6 mg/dL (ref 8.4–10.5)
Creatinine, Ser: 0.95 mg/dL (ref 0.40–1.20)
GFR: 60.12 mL/min (ref >60.00)
GLUCOSE: 112 mg/dL — AB (ref 70–99)
POTASSIUM: 3.9 meq/L (ref 3.5–5.1)
SODIUM: 138 meq/L (ref 135–145)

## 2015-12-10 LAB — HEMOGLOBIN A1C: Hgb A1c MFr Bld: 7.3 % — ABNORMAL HIGH (ref 4.6–6.5)

## 2015-12-10 MED ORDER — DOXYCYCLINE MONOHYDRATE 100 MG PO CAPS: 100.0000 mg | ORAL_CAPSULE | Freq: Two times a day (BID) | ORAL | 0 refills | Status: DC

## 2015-12-10 NOTE — Patient Instructions (Signed)
He may apply triple antibiotic ointment once a day for 4 days then stop. Keep one clean and dry at all times. Return to office if no improvement in 2 weeks.

## 2015-12-10 NOTE — Progress Notes (Signed)
Pre visit review using our clinic review tool, if applicable. No additional management support is needed unless otherwise documented below in the visit note. 

## 2015-12-10 NOTE — Progress Notes (Signed)
Subjective:  Patient ID: Jessica [name], female    DOB: [birth year]  Age: 82 y.o. MRN: [id number]  CC: Abrasion (Pt states that ferral kitten has scratched her legs and she thinks that they are infected. )   Jessica [name] presents for Nonhealing wound on left lower leg.  Wound Check  She was originally treated 5 to 10 days ago. Previous treatment included wound cleansing or irrigation (Antibiotic ointment). Maximum temperature: No fever. There has been clear discharge from the wound. The redness has worsened. The swelling has not changed. The pain has worsened. She has no difficulty moving the affected extremity or digit.  She took To the Animal nutritionist. It was negative for rabies. Last tetanus 2016 per patient.  Outpatient Medications Prior to Visit  Medication Sig Dispense Refill  . allopurinol (ZYLOPRIM) 100 MG tablet TAKE 1 TABLET (100 MG TOTAL) BY MOUTH DAILY. 90 tablet 1  . apixaban (ELIQUIS) 5 MG TABS tablet Take 1 tablet (5 mg total) by mouth 2 (two) times daily. 180 tablet 2  . b complex vitamins tablet Take 1 tablet by mouth daily.    . blood glucose meter kit and supplies KIT Use to test blood sugar up to three times a day, as directed by physician Dx:E11.09 1 each 0  . Cholecalciferol 1000 UNITS tablet Take 1,000 Units by mouth daily.      . furosemide (LASIX) 20 MG tablet Take 1 tablet (20 mg total) by mouth daily. Dx: E11.8. 90 tablet 3  . glimepiride (AMARYL) 1 MG tablet Take 1 tablet (1 mg total) by mouth 2 (two) times daily. 180 tablet 3  . glucose blood (ACCU-CHEK AVIVA) test strip 1 each by Other route 3 (three) times daily. Dx: E11.8 100 each 12  . HYDROcodone-acetaminophen (NORCO) 7.5-325 MG tablet Take 1 tablet by mouth every 6 (six) hours as needed for severe pain. 120 tablet 0  . Lancets MISC Use as directed to test blood sugar up to three times a day, as directed by physician. E11.09 300 each 1  . levothyroxine (SYNTHROID, LEVOTHROID) 75 MCG tablet TAKE 1  TABLET EVERY DAY 90 tablet 3  . LORazepam (ATIVAN) 0.5 MG tablet Take 1 tablet (0.5 mg total) by mouth 2 (two) times daily as needed for anxiety. 180 tablet 1  . losartan (COZAAR) 100 MG tablet TAKE 1 TABLET EVERY DAY 90 tablet 3  . metFORMIN (GLUCOPHAGE XR) 750 MG 24 hr tablet Take 1 tablet (750 mg total) by mouth daily with breakfast. 90 tablet 3  . Multiple Vitamins-Minerals (OCUVITE PRESERVISION) TABS Take 1 tablet by mouth 2 (two) times daily.    [name] omeprazole (PRILOSEC) 40 MG capsule TAKE 1 CAPSULE EVERY DAY 90 capsule 3  . temazepam (RESTORIL) 15 MG capsule Take 1 capsule (15 mg total) by mouth at bedtime as needed for sleep. 90 capsule 3  . triamcinolone ointment (KENALOG) 0.5 % APPLY TO AFFECTED AREA(S) THREE TIMES DAILY 45 g 1  . triamterene-hydrochlorothiazide (MAXZIDE-25) 37.5-25 MG tablet TAKE 1 TABLET EVERY DAY 90 tablet 3  . venlafaxine XR (EFFEXOR-XR) 75 MG 24 hr capsule TAKE 1 CAPSULE EVERY DAY WITH BREAKFAST 90 capsule 3   No facility-administered medications prior to visit.     ROS See HPI  Objective:  BP 130/78 (BP Location: Left Arm, Patient Position: Sitting, Cuff Size: Large)   Pulse 80   Temp 98.2 F (36.8 C) (Oral)   Ht '5\' 2"'  (1.575 m)   Wt 253 lb 8 oz (115 kg)  SpO2 94%   BMI 46.37 kg/m   BP Readings from Last 3 Encounters:  12/10/15 130/78  10/27/15 138/80  09/17/15 128/80    Wt Readings from Last 3 Encounters:  12/10/15 253 lb 8 oz (115 kg)  10/27/15 258 lb 8 oz (117.3 kg)  09/17/15 257 lb (116.6 kg)    Physical Exam  Constitutional: She is oriented to person, place, and time. No distress.  Cardiovascular: Normal rate.   Pulmonary/Chest: Effort normal.  Musculoskeletal: Normal range of motion. She exhibits tenderness. She exhibits no edema.  Neurological: She is alert and oriented to person, place, and time.  Skin: There is erythema.     Open wound on left lower leg (posterior aspect). With surrounding erythema, tender to touch, no  induration. No drainage. Patient reports no change in chronic lower extremity edema.  Vitals reviewed.   Lab Results  Component Value Date   WBC 8.8 09/16/2014   HGB 14.4 09/16/2014   HCT 43.1 09/16/2014   PLT 268 09/16/2014   GLUCOSE 225 (H) 09/13/2015   CHOL 210 (H) 10/10/2012   TRIG 289 (H) 10/10/2012   HDL 39 (L) 10/10/2012   LDLDIRECT 155.9 09/30/2012   LDLCALC 113 (H) 10/10/2012   ALT 25 09/16/2014   AST 32 09/16/2014   NA 136 09/13/2015   K 4.0 09/13/2015   CL 99 09/13/2015   CREATININE 0.87 09/13/2015   BUN 20 09/13/2015   CO2 26 09/13/2015   TSH 3.592 03/03/2015   INR 3.3 08/26/2009   HGBA1C 7.3 (H) 09/13/2015   MICROALBUR 4.5 (H) 12/02/2009    No results found.  Assessment & Plan:   Rima was seen today for abrasion.  Diagnoses and all orders for this visit:  Cat scratch of left lower leg, initial encounter -     Discontinue: doxycycline (MONODOX) 100 MG capsule; Take 1 capsule (100 mg total) by mouth 2 (two) times daily. With food -     doxycycline (MONODOX) 100 MG capsule; Take 1 capsule (100 mg total) by mouth 2 (two) times daily. With food  Cellulitis of left lower extremity -     Discontinue: doxycycline (MONODOX) 100 MG capsule; Take 1 capsule (100 mg total) by mouth 2 (two) times daily. With food -     doxycycline (MONODOX) 100 MG capsule; Take 1 capsule (100 mg total) by mouth 2 (two) times daily. With food   I am having Ms. Lax maintain her Cholecalciferol, OCUVITE PRESERVISION, b complex vitamins, blood glucose meter kit and supplies, Lancets, omeprazole, triamterene-hydrochlorothiazide, metFORMIN, losartan, levothyroxine, venlafaxine XR, allopurinol, LORazepam, triamcinolone ointment, glimepiride, HYDROcodone-acetaminophen, furosemide, glucose blood, temazepam, apixaban, and doxycycline.  Meds ordered this encounter  Medications  . DISCONTD: doxycycline (MONODOX) 100 MG capsule    Sig: Take 1 capsule (100 mg total) by mouth 2 (two)  times daily. With food    Dispense:  20 capsule    Refill:  0    Order Specific Question:   Supervising Provider    Answer:   [name] [[id number]]  . doxycycline (MONODOX) 100 MG capsule    Sig: Take 1 capsule (100 mg total) by mouth 2 (two) times daily. With food    Dispense:  20 capsule    Refill:  0    Order Specific Question:   Supervising Provider    Answer:   [name] [[id number]]    Follow-up: Return if symptoms worsen or fail to improve.  Wilfred Lacy, NP

## 2015-12-16 ENCOUNTER — Encounter: Payer: Self-pay | Admitting: Internal Medicine

## 2015-12-16 ENCOUNTER — Ambulatory Visit (INDEPENDENT_AMBULATORY_CARE_PROVIDER_SITE_OTHER): Payer: Commercial Managed Care - HMO | Admitting: Internal Medicine

## 2015-12-16 DIAGNOSIS — R600 Localized edema: Secondary | ICD-10-CM | POA: Diagnosis not present

## 2015-12-16 DIAGNOSIS — M545 Low back pain, unspecified: Secondary | ICD-10-CM

## 2015-12-16 DIAGNOSIS — M150 Primary generalized (osteo)arthritis: Secondary | ICD-10-CM

## 2015-12-16 DIAGNOSIS — I480 Paroxysmal atrial fibrillation: Secondary | ICD-10-CM

## 2015-12-16 DIAGNOSIS — Z23 Encounter for immunization: Secondary | ICD-10-CM

## 2015-12-16 DIAGNOSIS — M79662 Pain in left lower leg: Secondary | ICD-10-CM | POA: Diagnosis not present

## 2015-12-16 DIAGNOSIS — M159 Polyosteoarthritis, unspecified: Secondary | ICD-10-CM

## 2015-12-16 DIAGNOSIS — G8929 Other chronic pain: Secondary | ICD-10-CM

## 2015-12-16 MED ORDER — APIXABAN 5 MG PO TABS: 5.0000 mg | ORAL_TABLET | Freq: Two times a day (BID) | ORAL | 2 refills | Status: DC

## 2015-12-16 MED ORDER — HYDROCODONE-ACETAMINOPHEN 7.5-325 MG PO TABS: 1.0000 | ORAL_TABLET | Freq: Four times a day (QID) | ORAL | 0 refills | Status: DC | PRN

## 2015-12-16 NOTE — Progress Notes (Signed)
Subjective:  Patient ID: Jessica [name], female    DOB: 1933/06/18  Age: 82 y.o. MRN: [id number]  CC: No chief complaint on file.   HPI Jessica [name] presents for L LE ulcer - had a cat scratch - finishing Doxy. F/u PAF, LBP.  Outpatient Medications Prior to Visit  Medication Sig Dispense Refill  . allopurinol (ZYLOPRIM) 100 MG tablet TAKE 1 TABLET (100 MG TOTAL) BY MOUTH DAILY. 90 tablet 1  . apixaban (ELIQUIS) 5 MG TABS tablet Take 1 tablet (5 mg total) by mouth 2 (two) times daily. 180 tablet 2  . b complex vitamins tablet Take 1 tablet by mouth daily.    . blood glucose meter kit and supplies KIT Use to test blood sugar up to three times a day, as directed by physician Dx:E11.09 1 each 0  . Cholecalciferol 1000 UNITS tablet Take 1,000 Units by mouth daily.      [name] doxycycline (MONODOX) 100 MG capsule Take 1 capsule (100 mg total) by mouth 2 (two) times daily. With food 20 capsule 0  . furosemide (LASIX) 20 MG tablet Take 1 tablet (20 mg total) by mouth daily. Dx: E11.8. 90 tablet 3  . glimepiride (AMARYL) 1 MG tablet Take 1 tablet (1 mg total) by mouth 2 (two) times daily. 180 tablet 3  . glucose blood (ACCU-CHEK AVIVA) test strip 1 each by Other route 3 (three) times daily. Dx: E11.8 100 each 12  . HYDROcodone-acetaminophen (NORCO) 7.5-325 MG tablet Take 1 tablet by mouth every 6 (six) hours as needed for severe pain. 120 tablet 0  . Lancets MISC Use as directed to test blood sugar up to three times a day, as directed by physician. E11.09 300 each 1  . levothyroxine (SYNTHROID, LEVOTHROID) 75 MCG tablet TAKE 1 TABLET EVERY DAY 90 tablet 3  . LORazepam (ATIVAN) 0.5 MG tablet Take 1 tablet (0.5 mg total) by mouth 2 (two) times daily as needed for anxiety. 180 tablet 1  . losartan (COZAAR) 100 MG tablet TAKE 1 TABLET EVERY DAY 90 tablet 3  . metFORMIN (GLUCOPHAGE XR) 750 MG 24 hr tablet Take 1 tablet (750 mg total) by mouth daily with breakfast. 90 tablet 3  . Multiple  Vitamins-Minerals (OCUVITE PRESERVISION) TABS Take 1 tablet by mouth 2 (two) times daily.    [name] omeprazole (PRILOSEC) 40 MG capsule TAKE 1 CAPSULE EVERY DAY 90 capsule 3  . temazepam (RESTORIL) 15 MG capsule Take 1 capsule (15 mg total) by mouth at bedtime as needed for sleep. 90 capsule 3  . triamcinolone ointment (KENALOG) 0.5 % APPLY TO AFFECTED AREA(S) THREE TIMES DAILY 45 g 1  . triamterene-hydrochlorothiazide (MAXZIDE-25) 37.5-25 MG tablet TAKE 1 TABLET EVERY DAY 90 tablet 3  . venlafaxine XR (EFFEXOR-XR) 75 MG 24 hr capsule TAKE 1 CAPSULE EVERY DAY WITH BREAKFAST 90 capsule 3   No facility-administered medications prior to visit.     ROS Review of Systems  Constitutional: Negative for activity change, appetite change, chills, fatigue and unexpected weight change.  HENT: Negative for congestion, mouth sores and sinus pressure.   Eyes: Negative for visual disturbance.  Respiratory: Negative for cough and chest tightness.   Cardiovascular: Positive for leg swelling.  Gastrointestinal: Negative for abdominal pain and nausea.  Genitourinary: Negative for difficulty urinating, frequency and vaginal pain.  Musculoskeletal: Negative for back pain and gait problem.  Skin: Positive for wound. Negative for pallor and rash.  Neurological: Negative for dizziness, tremors, weakness, numbness and headaches.  Psychiatric/Behavioral:  Negative for confusion and sleep disturbance.    Objective:  BP 120/82   Pulse 86   Temp 98.1 F (36.7 C) (Oral)   Wt 253 lb (114.8 kg)   SpO2 95%   BMI 46.27 kg/m   BP Readings from Last 3 Encounters:  12/16/15 120/82  12/10/15 130/78  10/27/15 138/80    Wt Readings from Last 3 Encounters:  12/16/15 253 lb (114.8 kg)  12/10/15 253 lb 8 oz (115 kg)  10/27/15 258 lb 8 oz (117.3 kg)    Physical Exam  Constitutional: She appears well-developed. No distress.  HENT:  Head: Normocephalic.  Right Ear: External ear normal.  Left Ear: External ear normal.   Nose: Nose normal.  Mouth/Throat: Oropharynx is clear and moist.  Eyes: Conjunctivae are normal. Pupils are equal, round, and reactive to light. Right eye exhibits no discharge. Left eye exhibits no discharge.  Neck: Normal range of motion. Neck supple. No JVD present. No tracheal deviation present. No thyromegaly present.  Cardiovascular: Normal rate, regular rhythm and normal heart sounds.   Pulmonary/Chest: No stridor. No respiratory distress. She has no wheezes.  Abdominal: Soft. Bowel sounds are normal. She exhibits no distension and no mass. There is no tenderness. There is no rebound and no guarding.  Musculoskeletal: She exhibits edema and tenderness.  Lymphadenopathy:    She has no cervical adenopathy.  Neurological: She displays normal reflexes. No cranial nerve deficit. She exhibits normal muscle tone. Coordination normal.  Skin: No rash noted. No erythema.  Psychiatric: She has a normal mood and affect. Her behavior is normal. Judgment and thought content normal.  LEs w/B trace edema 7 mm LLE ulcer  Lab Results  Component Value Date   WBC 8.8 09/16/2014   HGB 14.4 09/16/2014   HCT 43.1 09/16/2014   PLT 268 09/16/2014   GLUCOSE 112 (H) 12/10/2015   CHOL 210 (H) 10/10/2012   TRIG 289 (H) 10/10/2012   HDL 39 (L) 10/10/2012   LDLDIRECT 155.9 09/30/2012   LDLCALC 113 (H) 10/10/2012   ALT 25 09/16/2014   AST 32 09/16/2014   NA 138 12/10/2015   K 3.9 12/10/2015   CL 100 12/10/2015   CREATININE 0.95 12/10/2015   BUN 24 (H) 12/10/2015   CO2 28 12/10/2015   TSH 3.592 03/03/2015   INR 3.3 08/26/2009   HGBA1C 7.3 (H) 12/10/2015   MICROALBUR 4.5 (H) 12/02/2009    No results found.  Assessment & Plan:   There are no diagnoses linked to this encounter. I am having Jessica [name] maintain her Cholecalciferol, OCUVITE PRESERVISION, b complex vitamins, blood glucose meter kit and supplies, Lancets, omeprazole, triamterene-hydrochlorothiazide, metFORMIN, losartan,  levothyroxine, venlafaxine XR, allopurinol, LORazepam, triamcinolone ointment, glimepiride, HYDROcodone-acetaminophen, furosemide, glucose blood, temazepam, apixaban, and doxycycline.  No orders of the defined types were placed in this encounter.    Follow-up: No Follow-up on file.  Walker Kehr, MD

## 2015-12-16 NOTE — Assessment & Plan Note (Signed)
[name] 9/27 appt On Eliquis

## 2015-12-16 NOTE — Progress Notes (Signed)
Pre visit review using our clinic review tool, if applicable. No additional management support is needed unless otherwise documented below in the visit note. 

## 2015-12-16 NOTE — Addendum Note (Signed)
Addended by: Cresenciano Lick on: 12/16/2015 01:49 PM   Modules accepted: Orders

## 2015-12-16 NOTE — Assessment & Plan Note (Signed)
Norco prn  Potential benefits of a long term opioids use as well as potential risks (i.e. addiction risk, apnea etc) and complications (i.e. Somnolence, constipation and others) were explained to the patient and were aknowledged. 

## 2015-12-16 NOTE — Assessment & Plan Note (Signed)
We discussed chr venous insufficiency, obesity Elevate LE's Compr socks

## 2015-12-16 NOTE — Assessment & Plan Note (Signed)
We discussed wound care and chr venous insufficiency, obesity Elevate LE's Compr socks Band-aid Finish the abx (Doxy)

## 2015-12-29 ENCOUNTER — Ambulatory Visit (INDEPENDENT_AMBULATORY_CARE_PROVIDER_SITE_OTHER): Payer: Commercial Managed Care - HMO | Admitting: Internal Medicine

## 2015-12-29 ENCOUNTER — Encounter: Payer: Self-pay | Admitting: Internal Medicine

## 2015-12-29 ENCOUNTER — Other Ambulatory Visit: Payer: Self-pay

## 2015-12-29 DIAGNOSIS — I350 Nonrheumatic aortic (valve) stenosis: Secondary | ICD-10-CM | POA: Diagnosis not present

## 2015-12-29 DIAGNOSIS — I441 Atrioventricular block, second degree: Secondary | ICD-10-CM | POA: Diagnosis not present

## 2015-12-29 LAB — CUP PACEART INCLINIC DEVICE CHECK
Date Time Interrogation Session: 20170927105244
Implantable Lead Implant Date: 20140710
Implantable Lead Model: 5592
MDC IDC LEAD IMPLANT DT: 20140710
MDC IDC LEAD LOCATION: 753859
MDC IDC LEAD LOCATION: 753860
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
## 2015-12-29 NOTE — Patient Instructions (Signed)
Medication Instructions:  Your physician recommends that you continue on your current medications as directed. Please refer to the Current Medication list given to you today.   Labwork: None ordered   Testing/Procedures: None ordered   Follow-Up: Your physician wants you to follow-up in: 12 months with Jessica Marshall, NP You will receive a reminder letter in the mail two months in advance. If you don't receive a letter, please call our office to schedule the follow-up appointment.  Remote monitoring is used to monitor your from home. This monitoring reduces the number of office visits required to check your device to one time per year. It allows [place] to keep an eye on the functioning of your device to ensure it is working properly. You are scheduled for a device check from home on 03/29/16. You may send your transmission at any time that day. If you have a wireless device, the transmission will be sent automatically. After your physician reviews your transmission, you will receive a postcard with your next transmission date.     Any Other Special Instructions Will Be Listed Below (If Applicable).     If you need a refill on your cardiac medications before your next appointment, please call your pharmacy.

## 2016-01-05 ENCOUNTER — Other Ambulatory Visit: Payer: Self-pay | Admitting: Internal Medicine

## 2016-01-06 MED ORDER — LORAZEPAM 0.5 MG PO TABS: 0.5000 mg | ORAL_TABLET | Freq: Two times a day (BID) | ORAL | 1 refills | Status: DC

## 2016-01-06 NOTE — Addendum Note (Signed)
Addended by: Cresenciano Lick on: 01/06/2016 01:08 PM   Modules accepted: Orders

## 2016-01-06 NOTE — Telephone Encounter (Signed)
Rx faxed to [facility] 

## 2016-01-12 ENCOUNTER — Ambulatory Visit (INDEPENDENT_AMBULATORY_CARE_PROVIDER_SITE_OTHER): Payer: Commercial Managed Care - HMO | Admitting: Internal Medicine

## 2016-01-12 ENCOUNTER — Encounter: Payer: Self-pay | Admitting: Internal Medicine

## 2016-01-12 DIAGNOSIS — I4892 Unspecified atrial flutter: Secondary | ICD-10-CM

## 2016-01-12 DIAGNOSIS — F411 Generalized anxiety disorder: Secondary | ICD-10-CM

## 2016-01-12 DIAGNOSIS — E118 Type 2 diabetes mellitus with unspecified complications: Secondary | ICD-10-CM | POA: Diagnosis not present

## 2016-01-12 DIAGNOSIS — I872 Venous insufficiency (chronic) (peripheral): Secondary | ICD-10-CM

## 2016-01-12 DIAGNOSIS — I10 Essential (primary) hypertension: Secondary | ICD-10-CM | POA: Diagnosis not present

## 2016-01-12 MED ORDER — TRIAMCINOLONE ACETONIDE 0.5 % EX OINT: 1.0000 "application " | TOPICAL_OINTMENT | Freq: Three times a day (TID) | CUTANEOUS | 2 refills | Status: DC

## 2016-01-12 MED ORDER — LORAZEPAM 0.5 MG PO TABS: 0.5000 mg | ORAL_TABLET | Freq: Two times a day (BID) | ORAL | 1 refills | Status: DC

## 2016-01-12 NOTE — Assessment & Plan Note (Signed)
Risks associated with diet and treatment noncompliance were discussed. Compliance was encouraged. Triamt/HCTZ, Losartan  

## 2016-01-12 NOTE — Assessment & Plan Note (Signed)
On Eliquis Pacemaker

## 2016-01-12 NOTE — Assessment & Plan Note (Signed)
Metformin 

## 2016-01-12 NOTE — Progress Notes (Signed)
Subjective:  Patient ID: Jessica [name], female    DOB: 1933/07/29  Age: 82 y.o. MRN: [id number]  CC: Leg Pain (4 week follow up Pt stated leg is looking much better)   HPI ZEOLA BRYS presents for gout, LBP, DM, obesity f/u. C/o cat scratching legs a- what to do?  Outpatient Medications Prior to Visit  Medication Sig Dispense Refill  . allopurinol (ZYLOPRIM) 100 MG tablet TAKE 1 TABLET (100 MG TOTAL) BY MOUTH DAILY. 90 tablet 1  . apixaban (ELIQUIS) 5 MG TABS tablet Take 1 tablet (5 mg total) by mouth 2 (two) times daily. 180 tablet 2  . b complex vitamins tablet Take 1 tablet by mouth daily.    . blood glucose meter kit and supplies KIT Use to test blood sugar up to three times a day, as directed by physician Dx:E11.09 1 each 0  . Cholecalciferol 1000 UNITS tablet Take 1,000 Units by mouth daily.      . furosemide (LASIX) 20 MG tablet Take 1 tablet (20 mg total) by mouth daily. Dx: E11.8. 90 tablet 3  . glimepiride (AMARYL) 1 MG tablet Take 1 tablet (1 mg total) by mouth 2 (two) times daily. 180 tablet 3  . glucose blood (ACCU-CHEK AVIVA) test strip 1 each by Other route 3 (three) times daily. Dx: E11.8 100 each 12  . HYDROcodone-acetaminophen (NORCO) 7.5-325 MG tablet Take 1 tablet by mouth every 6 (six) hours as needed for severe pain. 120 tablet 0  . Lancets MISC Use as directed to test blood sugar up to three times a day, as directed by physician. E11.09 300 each 1  . levothyroxine (SYNTHROID, LEVOTHROID) 75 MCG tablet Take 75 mcg by mouth daily.    [name] LORazepam (ATIVAN) 0.5 MG tablet Take 1 tablet (0.5 mg total) by mouth 2 (two) times daily. 180 tablet 1  . losartan (COZAAR) 100 MG tablet Take 100 mg by mouth daily.    . metFORMIN (GLUCOPHAGE XR) 750 MG 24 hr tablet Take 1 tablet (750 mg total) by mouth daily with breakfast. 90 tablet 3  . Multiple Vitamins-Minerals (OCUVITE PRESERVISION) TABS Take 1 tablet by mouth 2 (two) times daily.    [name] omeprazole (PRILOSEC) 40 MG  capsule Take 40 mg by mouth daily as needed (Heartburn or acid reflux).     . temazepam (RESTORIL) 15 MG capsule Take 1 capsule (15 mg total) by mouth at bedtime as needed for sleep. 90 capsule 3  . triamcinolone ointment (KENALOG) 0.5 % Apply 1 application topically 3 (three) times daily. APPLY TO AFFECTED AREA(S)    . triamterene-hydrochlorothiazide (MAXZIDE-25) 37.5-25 MG tablet Take 1 tablet by mouth daily.    [name] venlafaxine XR (EFFEXOR-XR) 75 MG 24 hr capsule TAKE 1 CAPSULE BY MOUTH EVERY DAY WITH BREAKFAST     No facility-administered medications prior to visit.     ROS Review of Systems  Constitutional: Positive for fatigue. Negative for activity change, appetite change, chills and unexpected weight change.  HENT: Negative for congestion, mouth sores and sinus pressure.   Eyes: Negative for visual disturbance.  Respiratory: Negative for cough and chest tightness.   Gastrointestinal: Negative for abdominal pain and nausea.  Genitourinary: Negative for difficulty urinating, frequency and vaginal pain.  Musculoskeletal: Positive for arthralgias and back pain. Negative for gait problem.  Skin: Positive for wound. Negative for pallor and rash.  Neurological: Negative for dizziness, tremors, weakness, numbness and headaches.  Psychiatric/Behavioral: Negative for confusion and sleep disturbance.    Objective:  BP 128/85 (BP Location: Left Arm, Patient Position: Sitting, Cuff Size: Large)   Pulse 88   Temp 97.4 F (36.3 C)   Ht '5\' 2"'  (1.575 m)   Wt 253 lb (114.8 kg)   SpO2 98%   BMI 46.27 kg/m   BP Readings from Last 3 Encounters:  01/12/16 128/85  12/29/15 138/80  12/16/15 120/82    Wt Readings from Last 3 Encounters:  01/12/16 253 lb (114.8 kg)  12/29/15 255 lb 9.6 oz (115.9 kg)  12/16/15 253 lb (114.8 kg)    Physical Exam  Constitutional: She appears well-developed. No distress.  HENT:  Head: Normocephalic.  Right Ear: External ear normal.  Left Ear: External ear  normal.  Nose: Nose normal.  Mouth/Throat: Oropharynx is clear and moist.  Eyes: Conjunctivae are normal. Pupils are equal, round, and reactive to light. Right eye exhibits no discharge. Left eye exhibits no discharge.  Neck: Normal range of motion. Neck supple. No JVD present. No tracheal deviation present. No thyromegaly present.  Cardiovascular: Normal rate, regular rhythm and normal heart sounds.   Pulmonary/Chest: No stridor. No respiratory distress. She has no wheezes.  Abdominal: Soft. Bowel sounds are normal. She exhibits no distension and no mass. There is no tenderness. There is no rebound and no guarding.  Musculoskeletal: She exhibits tenderness. She exhibits no edema.  Lymphadenopathy:    She has no cervical adenopathy.  Neurological: She displays normal reflexes. No cranial nerve deficit. She exhibits normal muscle tone. Coordination normal.  Skin: No rash noted. No erythema.  Psychiatric: She has a normal mood and affect. Her behavior is normal. Judgment and thought content normal.  Obese Scabs on ankles Stasis dermatitis LLE>RLE  Lab Results  Component Value Date   WBC 8.8 09/16/2014   HGB 14.4 09/16/2014   HCT 43.1 09/16/2014   PLT 268 09/16/2014   GLUCOSE 112 (H) 12/10/2015   CHOL 210 (H) 10/10/2012   TRIG 289 (H) 10/10/2012   HDL 39 (L) 10/10/2012   LDLDIRECT 155.9 09/30/2012   LDLCALC 113 (H) 10/10/2012   ALT 25 09/16/2014   AST 32 09/16/2014   NA 138 12/10/2015   K 3.9 12/10/2015   CL 100 12/10/2015   CREATININE 0.95 12/10/2015   BUN 24 (H) 12/10/2015   CO2 28 12/10/2015   TSH 3.592 03/03/2015   INR 3.3 08/26/2009   HGBA1C 7.3 (H) 12/10/2015   MICROALBUR 4.5 (H) 12/02/2009    No results found.  Assessment & Plan:   There are no diagnoses linked to this encounter. I am having Ms. Starner maintain her Cholecalciferol, OCUVITE PRESERVISION, b complex vitamins, blood glucose meter kit and supplies, Lancets, metFORMIN, allopurinol, glimepiride,  furosemide, glucose blood, temazepam, apixaban, HYDROcodone-acetaminophen, levothyroxine, omeprazole, losartan, triamterene-hydrochlorothiazide, venlafaxine XR, triamcinolone ointment, and LORazepam.  No orders of the defined types were placed in this encounter.    Follow-up: No Follow-up on file.  Walker Kehr, MD

## 2016-01-12 NOTE — Assessment & Plan Note (Signed)
Temazepam prn  Potential benefits of a long term prn benzodiazepines  use as well as potential risks  and complications were explained to the patient and were aknowledged.

## 2016-01-12 NOTE — Progress Notes (Signed)
Pre visit review using our clinic review tool, if applicable. No additional management support is needed unless otherwise documented below in the visit note. 

## 2016-01-12 NOTE — Assessment & Plan Note (Signed)
Wt Readings from Last 3 Encounters:  01/12/16 253 lb (114.8 kg)  12/29/15 255 lb 9.6 oz (115.9 kg)  12/16/15 253 lb (114.8 kg)

## 2016-01-12 NOTE — Assessment & Plan Note (Addendum)
2017 L>R Can't wear compr socks Kenalog oint Wt loss  Consider declawing your cat Wear long pants, socks

## 2016-01-24 NOTE — Telephone Encounter (Signed)
Eliquis pt assistance forms missing patient info/signature. I called pt. Forms are upfront for pt completion/signature. She states she will fax them to [facility] once she completes her part.

## 2016-01-25 ENCOUNTER — Telehealth: Payer: Self-pay | Admitting: *Deleted

## 2016-01-25 NOTE — Telephone Encounter (Signed)
Rep left msg on triage starting the application that was received for pt Elaquis the MD portion is outdated. Called rep back application that was sent on 8/2, she stated MD can cross out 8/2 date, and date & initial the same application that thye can still used. Printed application gave to MD to initial faxed back to [facility]Johny Chess

## 2016-01-27 ENCOUNTER — Telehealth: Payer: Self-pay | Admitting: Emergency Medicine

## 2016-01-27 NOTE — Telephone Encounter (Signed)
Pt called and asked that you give her a call back about the forms she needed filled out. Thanks.

## 2016-01-28 NOTE — Telephone Encounter (Signed)
I received 2 separate faxes from [facility]. One states her pt assistance for Eliquis application was denied. One states she was approved from 01/27/16-04/02/16. Pt informed of this, I gave her the customer service # [phone] to call with any further questions. Pt thanked me for calling.

## 2016-02-02 ENCOUNTER — Telehealth: Payer: Self-pay | Admitting: *Deleted

## 2016-02-02 MED ORDER — APIXABAN 5 MG PO TABS
5.0000 mg | ORAL_TABLET | Freq: Two times a day (BID) | ORAL | 0 refills | Status: DC
Start: 2016-02-02 — End: 2016-12-08

## 2016-02-02 NOTE — Telephone Encounter (Signed)
I received Eliquis 5 mg # 180 from [place] pt assistance. Pt informed meds are upfront for p/u.

## 2016-02-07 ENCOUNTER — Ambulatory Visit (INDEPENDENT_AMBULATORY_CARE_PROVIDER_SITE_OTHER): Payer: Commercial Managed Care - HMO | Admitting: Cardiovascular Disease

## 2016-02-07 ENCOUNTER — Encounter: Payer: Self-pay | Admitting: Cardiovascular Disease

## 2016-02-07 VITALS — BP 123/63 | HR 80 | Ht 62.0 in | Wt 256.0 lb

## 2016-02-07 DIAGNOSIS — L03116 Cellulitis of left lower limb: Secondary | ICD-10-CM

## 2016-02-07 DIAGNOSIS — I350 Nonrheumatic aortic (valve) stenosis: Secondary | ICD-10-CM | POA: Diagnosis not present

## 2016-02-07 DIAGNOSIS — I10 Essential (primary) hypertension: Secondary | ICD-10-CM

## 2016-02-07 DIAGNOSIS — I480 Paroxysmal atrial fibrillation: Secondary | ICD-10-CM

## 2016-02-07 MED ORDER — VENLAFAXINE HCL ER 150 MG PO CP24: 150.0000 mg | ORAL_CAPSULE | Freq: Every day | ORAL | 5 refills | Status: DC

## 2016-02-07 MED ORDER — CEPHALEXIN 500 MG PO CAPS: 500.0000 mg | ORAL_CAPSULE | Freq: Two times a day (BID) | ORAL | 0 refills | Status: DC

## 2016-02-07 NOTE — Addendum Note (Signed)
Addended by: Alvina Filbert B on: 02/07/2016 04:38 PM   Modules accepted: Orders

## 2016-02-07 NOTE — Patient Instructions (Addendum)
Medication Instructions:  INCREASE YOUR EFFEXOR TO 150 MG DAILY  START KEFLEX 500 MG TWICE A DAY FOR 7 DAYS  Labwork: NONE  Testing/Procedures: Your physician has requested that you have an echocardiogram. Echocardiography is a painless test that uses sound waves to create images of your heart. It provides your doctor with information about the size and shape of your heart and how well your heart's chambers and valves are working. This procedure takes approximately one hour. There are no restrictions for this procedure. 1 FEW DAYS BEFORE YOUR FOLLOW UP IN 1 YEAR   Follow-Up: Your physician wants you to follow-up in: [place] will receive a reminder letter in the mail two months in advance. If you don't receive a letter, please call our office to schedule the follow-up appointment.  Any Other Special Instructions Will Be Listed Below (If Applicable). IF YOUR LEG DOES NOT IMPROVE WITH THE [place] FOLLOW UP WITH YOUR PRIMARY CARE DOCTOR   If you need a refill on your cardiac medications before your next appointment, please call your pharmacy.
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
## 2016-02-29 ENCOUNTER — Telehealth: Payer: Self-pay | Admitting: Emergency Medicine

## 2016-02-29 ENCOUNTER — Other Ambulatory Visit: Payer: Self-pay | Admitting: Internal Medicine

## 2016-02-29 NOTE — Telephone Encounter (Signed)
OK to fill this prescription with additional refills x0 Thank you!  

## 2016-02-29 NOTE — Telephone Encounter (Signed)
Left msg on triage requesting refill on her Hydrocodone.../lmb 

## 2016-03-01 ENCOUNTER — Ambulatory Visit: Payer: Commercial Managed Care - HMO | Admitting: Nurse Practitioner

## 2016-03-01 MED ORDER — HYDROCODONE-ACETAMINOPHEN 7.5-325 MG PO TABS: 1.0000 | ORAL_TABLET | Freq: Four times a day (QID) | ORAL | 0 refills | Status: DC | PRN

## 2016-03-01 NOTE — Telephone Encounter (Signed)
Notified pt rx ready for pick-up.../lmb 

## 2016-03-01 NOTE — Addendum Note (Signed)
Addended by: Earnstine Regal on: 03/01/2016 08:41 AM   Modules accepted: Orders

## 2016-03-07 ENCOUNTER — Telehealth: Payer: Self-pay | Admitting: Internal Medicine

## 2016-03-07 NOTE — Telephone Encounter (Signed)
I doubt. However, it is ok to see how she does with 1/2 tab in place of one with the same frequency or do without if tolerated. Thx

## 2016-03-07 NOTE — Telephone Encounter (Signed)
Patient states she got hydrocodone acetaminophen refilled Saturday.  States she was nauseas Saturday and Sunday while taking.  Patient states she stopped taking it the last two days and she has been fine.  Patient states she found out from the pharmacy that they changed manufacture from [place] to [place].  Patient believes this is the cause.  Patient would like to know what Dr. Alain Marion suggest.

## 2016-03-08 MED ORDER — TRAMADOL-ACETAMINOPHEN 37.5-325 MG PO TABS: 1.0000 | ORAL_TABLET | Freq: Four times a day (QID) | ORAL | 3 refills | Status: DC | PRN

## 2016-03-08 NOTE — Telephone Encounter (Signed)
Please discontinue hydrocodone. We'll call in Ultracet she can bring on use hydrocodone to our orthotist to discard  thank you

## 2016-03-08 NOTE — Telephone Encounter (Signed)
Pt informed- she states she is not and will not take anymore  Hydrocodone/APAP as it made her deathly sick. She is upset that her pharmacy has changed the manufacturer of her hydroco/apap. I advised her we have no control of this and to consult with her pharmacy.  She is requesting a totally different, but milder pain medication. Please advise.

## 2016-03-09 ENCOUNTER — Telehealth: Payer: Self-pay | Admitting: Emergency Medicine

## 2016-03-09 NOTE — Telephone Encounter (Signed)
Rf phoned in. See meds. Pt informed of below.

## 2016-03-09 NOTE — Telephone Encounter (Signed)
Pt called and stated she was suppose to pick up a prescription at the drug store today but they dont have a prescription for her. Can you look into this. Thanks.

## 2016-03-10 NOTE — Telephone Encounter (Signed)
I called [facility] [address]. Rx is ready for p/u. Pt informed. She states she will bring the Hydroco/APAP that she is not going to take to our office on 03/13/16.

## 2016-03-29 ENCOUNTER — Ambulatory Visit (INDEPENDENT_AMBULATORY_CARE_PROVIDER_SITE_OTHER): Payer: Commercial Managed Care - HMO | Admitting: *Deleted

## 2016-03-29 ENCOUNTER — Telehealth: Payer: Self-pay | Admitting: Cardiology

## 2016-03-29 DIAGNOSIS — I441 Atrioventricular block, second degree: Secondary | ICD-10-CM | POA: Diagnosis not present

## 2016-03-29 NOTE — Telephone Encounter (Signed)
Spoke with pt and reminded pt of remote transmission that is due today. Pt verbalized understanding.   

## 2016-03-30 NOTE — Progress Notes (Signed)
Remote pacemaker transmission.   

## 2016-03-31 ENCOUNTER — Encounter: Payer: Self-pay | Admitting: Cardiology

## 2016-03-31 LAB — CUP PACEART REMOTE DEVICE CHECK
Battery Impedance: 183 Ohm
Battery Voltage: 2.79 V
Brady Statistic AP VP Percent: 1 %
Brady Statistic AP VS Percent: 0 %
Date Time Interrogation Session: 20171227193809
Implantable Lead Implant Date: 20140710
Implantable Lead Location: 753860
Implantable Lead Model: 5092
Lead Channel Impedance Value: 510 Ohm
Lead Channel Impedance Value: 706 Ohm
Lead Channel Pacing Threshold Pulse Width: 0.4 ms
Lead Channel Setting Pacing Amplitude: 2.5 V
Lead Channel Setting Pacing Pulse Width: 0.4 ms
MDC IDC LEAD IMPLANT DT: 20140710
MDC IDC LEAD LOCATION: 753859
MDC IDC LEAD MODEL: 5592
MDC IDC MSMT BATTERY REMAINING LONGEVITY: 122 mo
MDC IDC MSMT LEADCHNL RA PACING THRESHOLD AMPLITUDE: 0.625 V
MDC IDC MSMT LEADCHNL RV PACING THRESHOLD AMPLITUDE: 0.375 V
MDC IDC MSMT LEADCHNL RV PACING THRESHOLD PULSEWIDTH: 0.4 ms
MDC IDC PG IMPLANT DT: 20140710
MDC IDC SET LEADCHNL RA PACING AMPLITUDE: 2 V
MDC IDC SET LEADCHNL RV SENSING SENSITIVITY: 4 mV
MDC IDC STAT BRADY AS VP PERCENT: 97 %
MDC IDC STAT BRADY AS VS PERCENT: 1 %

## 2016-04-07 ENCOUNTER — Other Ambulatory Visit (INDEPENDENT_AMBULATORY_CARE_PROVIDER_SITE_OTHER): Payer: Commercial Managed Care - HMO

## 2016-04-07 DIAGNOSIS — E118 Type 2 diabetes mellitus with unspecified complications: Secondary | ICD-10-CM | POA: Diagnosis not present

## 2016-04-07 LAB — BASIC METABOLIC PANEL
BUN: 20 mg/dL (ref 6–23)
CO2: 26 mEq/L (ref 19–32)
CREATININE: 0.84 mg/dL (ref 0.40–1.20)
Calcium: 9.9 mg/dL (ref 8.4–10.5)
Chloride: 104 mEq/L (ref 96–112)
GFR: 69.24 mL/min (ref >60.00)
Glucose, Bld: 174 mg/dL — ABNORMAL HIGH (ref 70–99)
Potassium: 3.8 mEq/L (ref 3.5–5.1)
SODIUM: 140 meq/L (ref 135–145)

## 2016-04-07 LAB — HEMOGLOBIN A1C: HEMOGLOBIN A1C: 7 % — AB (ref 4.6–6.5)

## 2016-04-10 ENCOUNTER — Other Ambulatory Visit: Payer: Self-pay | Admitting: Internal Medicine

## 2016-04-13 ENCOUNTER — Encounter: Payer: Self-pay | Admitting: Internal Medicine

## 2016-04-13 ENCOUNTER — Ambulatory Visit (INDEPENDENT_AMBULATORY_CARE_PROVIDER_SITE_OTHER): Payer: Medicare HMO | Admitting: Internal Medicine

## 2016-04-13 DIAGNOSIS — G8929 Other chronic pain: Secondary | ICD-10-CM

## 2016-04-13 DIAGNOSIS — I480 Paroxysmal atrial fibrillation: Secondary | ICD-10-CM

## 2016-04-13 DIAGNOSIS — I10 Essential (primary) hypertension: Secondary | ICD-10-CM

## 2016-04-13 DIAGNOSIS — L03012 Cellulitis of left finger: Secondary | ICD-10-CM | POA: Diagnosis not present

## 2016-04-13 DIAGNOSIS — M545 Low back pain, unspecified: Secondary | ICD-10-CM

## 2016-04-13 DIAGNOSIS — E118 Type 2 diabetes mellitus with unspecified complications: Secondary | ICD-10-CM | POA: Diagnosis not present

## 2016-04-13 MED ORDER — HYDROCODONE-ACETAMINOPHEN 5-325 MG PO TABS: 1.0000 | ORAL_TABLET | Freq: Four times a day (QID) | ORAL | 0 refills | Status: DC | PRN

## 2016-04-13 MED ORDER — AMOXICILLIN-POT CLAVULANATE 875-125 MG PO TABS: 1.0000 | ORAL_TABLET | Freq: Two times a day (BID) | ORAL | 0 refills | Status: DC

## 2016-04-13 MED ORDER — MUPIROCIN 2 % EX OINT: TOPICAL_OINTMENT | CUTANEOUS | 0 refills | Status: DC

## 2016-04-13 NOTE — Assessment & Plan Note (Signed)
Metformin Labs 

## 2016-04-13 NOTE — Assessment & Plan Note (Signed)
Augmentin Bactroban

## 2016-04-13 NOTE — Assessment & Plan Note (Signed)
Wt Readings from Last 3 Encounters:  04/13/16 258 lb (117 kg)  02/07/16 256 lb (116.1 kg)  01/12/16 253 lb (114.8 kg)

## 2016-04-13 NOTE — Progress Notes (Signed)
Pre visit review using our clinic review tool, if applicable. No additional management support is needed unless otherwise documented below in the visit note. 

## 2016-04-13 NOTE — Assessment & Plan Note (Signed)
On Eliquis

## 2016-04-13 NOTE — Assessment & Plan Note (Signed)
Norco from a different manufacturer caused nausea. C/o LBP: Tramadol is not working Reduce Norco dose to 5/325 D/c ultracet

## 2016-04-13 NOTE — Assessment & Plan Note (Signed)
Triamt-HCT, Losartan 

## 2016-04-13 NOTE — Progress Notes (Signed)
Subjective:  Patient ID: Jessica [name], female    DOB: 1933-10-25  Age: 83 y.o. MRN: [id number]  CC: No chief complaint on file.   HPI Jessica [name] presents for grief - dog Rusty died in 2023-02-12 She has a cat - scratched her L 3d finger - swollen Norco from a different manufacturer caused nausea. C/o LBP: Tramadol is not working  C/o LBP - worse  Outpatient Medications Prior to Visit  Medication Sig Dispense Refill  . allopurinol (ZYLOPRIM) 100 MG tablet TAKE 1 TABLET EVERY DAY 90 tablet 3  . apixaban (ELIQUIS) 5 MG TABS tablet Take 1 tablet (5 mg total) by mouth 2 (two) times daily. 180 tablet 0  . b complex vitamins tablet Take 1 tablet by mouth daily.    . blood glucose meter kit and supplies KIT Use to test blood sugar up to three times a day, as directed by physician Dx:E11.09 1 each 0  . cephALEXin (KEFLEX) 500 MG capsule Take 1 capsule (500 mg total) by mouth 2 (two) times daily. 14 capsule 0  . Cholecalciferol 1000 UNITS tablet Take 1,000 Units by mouth daily.      . furosemide (LASIX) 20 MG tablet Take 1 tablet (20 mg total) by mouth daily. Dx: E11.8. 90 tablet 3  . glimepiride (AMARYL) 1 MG tablet Take 1 tablet (1 mg total) by mouth 2 (two) times daily. 180 tablet 3  . glucose blood (ACCU-CHEK AVIVA) test strip 1 each by Other route 3 (three) times daily. Dx: E11.8 100 each 12  . Lancets MISC Use as directed to test blood sugar up to three times a day, as directed by physician. E11.09 300 each 1  . levothyroxine (SYNTHROID, LEVOTHROID) 75 MCG tablet TAKE 1 TABLET EVERY DAY 90 tablet 3  . LORazepam (ATIVAN) 0.5 MG tablet Take 1 tablet (0.5 mg total) by mouth 2 (two) times daily. 180 tablet 1  . losartan (COZAAR) 100 MG tablet TAKE 1 TABLET EVERY DAY 90 tablet 3  . metFORMIN (GLUCOPHAGE-XR) 750 MG 24 hr tablet TAKE 1 TABLET EVERY DAY WITH BREAKFAST 90 tablet 3  . Multiple Vitamins-Minerals (OCUVITE PRESERVISION) TABS Take 1 tablet by mouth 2 (two) times daily.    [name]  omeprazole (PRILOSEC) 40 MG capsule TAKE 1 CAPSULE EVERY DAY 90 capsule 3  . temazepam (RESTORIL) 15 MG capsule Take 1 capsule (15 mg total) by mouth at bedtime as needed for sleep. 90 capsule 3  . traMADol-acetaminophen (ULTRACET) 37.5-325 MG tablet Take 1 tablet by mouth every 6 (six) hours as needed. 120 tablet 3  . triamcinolone ointment (KENALOG) 0.5 % Apply 1 application topically 3 (three) times daily. APPLY TO AFFECTED AREA(S) 90 g 2  . triamterene-hydrochlorothiazide (MAXZIDE-25) 37.5-25 MG tablet TAKE 1 TABLET EVERY DAY 90 tablet 3  . venlafaxine XR (EFFEXOR-XR) 150 MG 24 hr capsule Take 1 capsule (150 mg total) by mouth daily with breakfast. 30 capsule 5   No facility-administered medications prior to visit.     ROS Review of Systems  Constitutional: Positive for unexpected weight change. Negative for activity change, appetite change, chills and fatigue.  HENT: Negative for congestion, mouth sores and sinus pressure.   Eyes: Negative for visual disturbance.  Respiratory: Negative for cough and chest tightness.   Gastrointestinal: Negative for abdominal pain and nausea.  Genitourinary: Negative for difficulty urinating, frequency and vaginal pain.  Musculoskeletal: Positive for arthralgias and back pain. Negative for gait problem.  Skin: Negative for pallor and rash.  Neurological:  Negative for dizziness, tremors, weakness, numbness and headaches.  Psychiatric/Behavioral: Positive for dysphoric mood and sleep disturbance. Negative for confusion. The patient is nervous/anxious.     Objective:  BP 128/70   Pulse 85   Temp 98.4 F (36.9 C) (Oral)   Wt 258 lb (117 kg)   SpO2 96%   BMI 47.19 kg/m   BP Readings from Last 3 Encounters:  04/13/16 128/70  02/07/16 123/63  01/12/16 128/85    Wt Readings from Last 3 Encounters:  04/13/16 258 lb (117 kg)  02/07/16 256 lb (116.1 kg)  01/12/16 253 lb (114.8 kg)    Physical Exam  Constitutional: She appears well-developed.  No distress.  HENT:  Head: Normocephalic.  Right Ear: External ear normal.  Left Ear: External ear normal.  Nose: Nose normal.  Mouth/Throat: Oropharynx is clear and moist.  Eyes: Conjunctivae are normal. Pupils are equal, round, and reactive to light. Right eye exhibits no discharge. Left eye exhibits no discharge.  Neck: Normal range of motion. Neck supple. No JVD present. No tracheal deviation present. No thyromegaly present.  Cardiovascular: Normal rate, regular rhythm and normal heart sounds.   Pulmonary/Chest: No stridor. No respiratory distress. She has no wheezes.  Abdominal: Soft. Bowel sounds are normal. She exhibits no distension and no mass. There is no tenderness. There is no rebound and no guarding.  Musculoskeletal: She exhibits tenderness. She exhibits no edema.  Lymphadenopathy:    She has no cervical adenopathy.  Neurological: She displays normal reflexes. No cranial nerve deficit. She exhibits normal muscle tone. Coordination abnormal.  Skin: No rash noted. There is erythema.  Psychiatric: She has a normal mood and affect. Her behavior is normal. Judgment and thought content normal.  Obese Finger w/paronychia L 3d  Lab Results  Component Value Date   WBC 8.8 09/16/2014   HGB 14.4 09/16/2014   HCT 43.1 09/16/2014   PLT 268 09/16/2014   GLUCOSE 174 (H) 04/07/2016   CHOL 210 (H) 10/10/2012   TRIG 289 (H) 10/10/2012   HDL 39 (L) 10/10/2012   LDLDIRECT 155.9 09/30/2012   LDLCALC 113 (H) 10/10/2012   ALT 25 09/16/2014   AST 32 09/16/2014   NA 140 04/07/2016   K 3.8 04/07/2016   CL 104 04/07/2016   CREATININE 0.84 04/07/2016   BUN 20 04/07/2016   CO2 26 04/07/2016   TSH 3.592 03/03/2015   INR 3.3 08/26/2009   HGBA1C 7.0 (H) 04/07/2016   MICROALBUR 4.5 (H) 12/02/2009    No results found.  Assessment & Plan:   There are no diagnoses linked to this encounter. I am having Ms. Keirsey maintain her Cholecalciferol, OCUVITE PRESERVISION, b complex vitamins,  blood glucose meter kit and supplies, Lancets, glimepiride, furosemide, glucose blood, temazepam, triamcinolone ointment, LORazepam, apixaban, cephALEXin, venlafaxine XR, allopurinol, traMADol-acetaminophen, triamterene-hydrochlorothiazide, losartan, omeprazole, metFORMIN, and levothyroxine.  No orders of the defined types were placed in this encounter.    Follow-up: No Follow-up on file.  Walker Kehr, MD

## 2016-05-03 ENCOUNTER — Other Ambulatory Visit: Payer: Self-pay | Admitting: Internal Medicine

## 2016-05-05 MED ORDER — TEMAZEPAM 15 MG PO CAPS: 15.0000 mg | ORAL_CAPSULE | Freq: Every evening | ORAL | 1 refills | Status: DC | PRN

## 2016-05-05 MED ORDER — TEMAZEPAM 15 MG PO CAPS: 15.0000 mg | ORAL_CAPSULE | Freq: Every evening | ORAL | 3 refills | Status: DC | PRN

## 2016-05-05 NOTE — Telephone Encounter (Addendum)
Called [facility]ill into [facility] spoke w/pharmacist kim gave MD authorization. Pharmacist is wanting to know if pt is still taking the Lorazepam also. She states these two meds should not be taking together. Pls advise....Jessica [name]

## 2016-05-05 NOTE — Addendum Note (Signed)
Addended by: Earnstine Regal on: 05/05/2016 08:52 AM   Modules accepted: Orders

## 2016-05-12 ENCOUNTER — Ambulatory Visit (INDEPENDENT_AMBULATORY_CARE_PROVIDER_SITE_OTHER): Payer: Medicare HMO | Admitting: Internal Medicine

## 2016-05-12 ENCOUNTER — Encounter: Payer: Self-pay | Admitting: Internal Medicine

## 2016-05-12 DIAGNOSIS — I10 Essential (primary) hypertension: Secondary | ICD-10-CM

## 2016-05-12 DIAGNOSIS — I4892 Unspecified atrial flutter: Secondary | ICD-10-CM | POA: Diagnosis not present

## 2016-05-12 DIAGNOSIS — G8929 Other chronic pain: Secondary | ICD-10-CM

## 2016-05-12 DIAGNOSIS — M545 Low back pain, unspecified: Secondary | ICD-10-CM

## 2016-05-12 DIAGNOSIS — R0683 Snoring: Secondary | ICD-10-CM

## 2016-05-12 DIAGNOSIS — E118 Type 2 diabetes mellitus with unspecified complications: Secondary | ICD-10-CM | POA: Diagnosis not present

## 2016-05-12 DIAGNOSIS — E034 Atrophy of thyroid (acquired): Secondary | ICD-10-CM

## 2016-05-12 DIAGNOSIS — F411 Generalized anxiety disorder: Secondary | ICD-10-CM

## 2016-05-12 NOTE — Assessment & Plan Note (Signed)
Norco prn  Potential benefits of a long term opioids use as well as potential risks (i.e. addiction risk, apnea etc) and complications (i.e. Somnolence, constipation and others) were explained to the patient and were aknowledged. 

## 2016-05-12 NOTE — Assessment & Plan Note (Signed)
C/o Eloquis being too $$$ Options discussed: Xarelto, Coumadin

## 2016-05-12 NOTE — Assessment & Plan Note (Signed)
Discussed poss OSA

## 2016-05-12 NOTE — Assessment & Plan Note (Signed)
Temazepam prn   Potential benefits of a long term benzodiazepines  use as well as potential risks  and complications were explained to the patient and were aknowledged. 

## 2016-05-12 NOTE — Progress Notes (Signed)
Subjective:  Patient ID: Jessica [name], female    DOB: 09/30/1933  Age: 83 y.o. MRN: [id number]  CC: No chief complaint on file.   HPI [name] presents for A fib, obesity, LBP f/u  Outpatient Medications Prior to Visit  Medication Sig Dispense Refill  . allopurinol (ZYLOPRIM) 100 MG tablet TAKE 1 TABLET EVERY DAY 90 tablet 3  . amoxicillin-clavulanate (AUGMENTIN) 875-125 MG tablet Take 1 tablet by mouth 2 (two) times daily. 14 tablet 0  . apixaban (ELIQUIS) 5 MG TABS tablet Take 1 tablet (5 mg total) by mouth 2 (two) times daily. 180 tablet 0  . b complex vitamins tablet Take 1 tablet by mouth daily.    . blood glucose meter kit and supplies KIT Use to test blood sugar up to three times a day, as directed by physician Dx:E11.09 1 each 0  . Cholecalciferol 1000 UNITS tablet Take 1,000 Units by mouth daily.      . furosemide (LASIX) 20 MG tablet Take 1 tablet (20 mg total) by mouth daily. Dx: E11.8. 90 tablet 3  . glimepiride (AMARYL) 1 MG tablet Take 1 tablet (1 mg total) by mouth 2 (two) times daily. 180 tablet 3  . glucose blood (ACCU-CHEK AVIVA) test strip 1 each by Other route 3 (three) times daily. Dx: E11.8 100 each 12  . HYDROcodone-acetaminophen (NORCO/VICODIN) 5-325 MG tablet Take 1 tablet by mouth every 6 (six) hours as needed for moderate pain. 120 tablet 0  . Lancets MISC Use as directed to test blood sugar up to three times a day, as directed by physician. E11.09 300 each 1  . levothyroxine (SYNTHROID, LEVOTHROID) 75 MCG tablet TAKE 1 TABLET EVERY DAY 90 tablet 3  . LORazepam (ATIVAN) 0.5 MG tablet Take 1 tablet (0.5 mg total) by mouth 2 (two) times daily. 180 tablet 1  . losartan (COZAAR) 100 MG tablet TAKE 1 TABLET EVERY DAY 90 tablet 3  . metFORMIN (GLUCOPHAGE-XR) 750 MG 24 hr tablet TAKE 1 TABLET EVERY DAY WITH BREAKFAST 90 tablet 3  . Multiple Vitamins-Minerals (OCUVITE PRESERVISION) TABS Take 1 tablet by mouth 2 (two) times daily.    . mupirocin ointment  (BACTROBAN) 2 % Use qid 22 g 0  . omeprazole (PRILOSEC) 40 MG capsule TAKE 1 CAPSULE EVERY DAY 90 capsule 3  . temazepam (RESTORIL) 15 MG capsule Take 1 capsule (15 mg total) by mouth at bedtime as needed for sleep. 90 capsule 1  . triamcinolone ointment (KENALOG) 0.5 % Apply 1 application topically 3 (three) times daily. APPLY TO AFFECTED AREA(S) 90 g 2  . triamterene-hydrochlorothiazide (MAXZIDE-25) 37.5-25 MG tablet TAKE 1 TABLET EVERY DAY 90 tablet 3  . venlafaxine XR (EFFEXOR-XR) 150 MG 24 hr capsule Take 1 capsule (150 mg total) by mouth daily with breakfast. 30 capsule 5  . venlafaxine XR (EFFEXOR-XR) 75 MG 24 hr capsule TAKE 1 CAPSULE EVERY DAY WITH BREAKFAST 90 capsule 2   No facility-administered medications prior to visit.     ROS Review of Systems  Constitutional: Positive for fatigue. Negative for activity change, appetite change, chills and unexpected weight change.  HENT: Negative for congestion, mouth sores and sinus pressure.   Eyes: Negative for visual disturbance.  Respiratory: Negative for cough and chest tightness.   Gastrointestinal: Negative for abdominal pain and nausea.  Genitourinary: Negative for difficulty urinating, frequency and vaginal pain.  Musculoskeletal: Positive for arthralgias, back pain and gait problem.  Skin: Negative for color change, pallor and rash.  Neurological:  Negative for dizziness, tremors, weakness, numbness and headaches.  Psychiatric/Behavioral: Positive for sleep disturbance. Negative for confusion. The patient is nervous/anxious.     Objective:  BP 138/80   Pulse 100   Temp 98.2 F (36.8 C) (Oral)   Resp 20   Wt 258 lb 4 oz (117.1 kg)   SpO2 95%   BMI 47.23 kg/m   BP Readings from Last 3 Encounters:  05/12/16 138/80  04/13/16 128/70  02/07/16 123/63    Wt Readings from Last 3 Encounters:  05/12/16 258 lb 4 oz (117.1 kg)  04/13/16 258 lb (117 kg)  02/07/16 256 lb (116.1 kg)    Physical Exam  Constitutional: She  appears well-developed. No distress.  HENT:  Head: Normocephalic.  Right Ear: External ear normal.  Left Ear: External ear normal.  Nose: Nose normal.  Mouth/Throat: Oropharynx is clear and moist.  Eyes: Conjunctivae are normal. Pupils are equal, round, and reactive to light. Right eye exhibits no discharge. Left eye exhibits no discharge.  Neck: Normal range of motion. Neck supple. No JVD present. No tracheal deviation present. No thyromegaly present.  Cardiovascular: Normal rate, regular rhythm and normal heart sounds.   Pulmonary/Chest: No stridor. No respiratory distress. She has no wheezes.  Abdominal: Soft. Bowel sounds are normal. She exhibits no distension and no mass. There is no tenderness. There is no rebound and no guarding.  Musculoskeletal: She exhibits tenderness. She exhibits no edema.  Lymphadenopathy:    She has no cervical adenopathy.  Neurological: She displays normal reflexes. No cranial nerve deficit. She exhibits normal muscle tone. Coordination normal.  Skin: No rash noted. No erythema.  Psychiatric: She has a normal mood and affect. Her behavior is normal. Judgment and thought content normal.  Obese LS is tender LE w/trace edema L>R Brown discoloration over LLE Lab Results  Component Value Date   WBC 8.8 09/16/2014   HGB 14.4 09/16/2014   HCT 43.1 09/16/2014   PLT 268 09/16/2014   GLUCOSE 174 (H) 04/07/2016   CHOL 210 (H) 10/10/2012   TRIG 289 (H) 10/10/2012   HDL 39 (L) 10/10/2012   LDLDIRECT 155.9 09/30/2012   LDLCALC 113 (H) 10/10/2012   ALT 25 09/16/2014   AST 32 09/16/2014   NA 140 04/07/2016   K 3.8 04/07/2016   CL 104 04/07/2016   CREATININE 0.84 04/07/2016   BUN 20 04/07/2016   CO2 26 04/07/2016   TSH 3.592 03/03/2015   INR 3.3 08/26/2009   HGBA1C 7.0 (H) 04/07/2016   MICROALBUR 4.5 (H) 12/02/2009    No results found.  Assessment & Plan:   There are no diagnoses linked to this encounter. I am having Ms. Ellett maintain her  Cholecalciferol, OCUVITE PRESERVISION, b complex vitamins, blood glucose meter kit and supplies, Lancets, glimepiride, furosemide, glucose blood, triamcinolone ointment, LORazepam, apixaban, venlafaxine XR, allopurinol, triamterene-hydrochlorothiazide, losartan, omeprazole, metFORMIN, levothyroxine, HYDROcodone-acetaminophen, amoxicillin-clavulanate, mupirocin ointment, venlafaxine XR, and temazepam.  No orders of the defined types were placed in this encounter.    Follow-up: No Follow-up on file.  Walker Kehr, MD

## 2016-05-12 NOTE — Progress Notes (Signed)
Pre visit review using our clinic review tool, if applicable. No additional management support is needed unless otherwise documented below in the visit note. 

## 2016-05-12 NOTE — Assessment & Plan Note (Signed)
Triamt/HCT Losartan NAS wt Loose wt

## 2016-05-12 NOTE — Assessment & Plan Note (Signed)
On Levothroid 

## 2016-05-12 NOTE — Assessment & Plan Note (Signed)
Metformin 

## 2016-06-28 ENCOUNTER — Ambulatory Visit (INDEPENDENT_AMBULATORY_CARE_PROVIDER_SITE_OTHER): Payer: Medicare HMO | Admitting: *Deleted

## 2016-06-28 ENCOUNTER — Telehealth: Payer: Self-pay | Admitting: Cardiology

## 2016-06-28 DIAGNOSIS — I441 Atrioventricular block, second degree: Secondary | ICD-10-CM | POA: Diagnosis not present

## 2016-06-28 NOTE — Telephone Encounter (Signed)
Attempted to confirm remote transmission with pt. No answer and was unable to leave a message.   

## 2016-06-29 LAB — CUP PACEART REMOTE DEVICE CHECK
Battery Remaining Longevity: 117 mo
Brady Statistic AP VP Percent: 1 %
Brady Statistic AP VS Percent: 0 %
Brady Statistic AS VP Percent: 97 %
Brady Statistic AS VS Percent: 1 %
Date Time Interrogation Session: 20180328193831
Implantable Lead Implant Date: 20140710
Implantable Lead Model: 5092
Implantable Pulse Generator Implant Date: 20140710
Lead Channel Impedance Value: 559 Ohm
Lead Channel Impedance Value: 690 Ohm
Lead Channel Pacing Threshold Amplitude: 0.375 V
Lead Channel Pacing Threshold Pulse Width: 0.4 ms
Lead Channel Setting Pacing Amplitude: 2 V
Lead Channel Setting Sensing Sensitivity: 4 mV
MDC IDC LEAD IMPLANT DT: 20140710
MDC IDC LEAD LOCATION: 753859
MDC IDC LEAD LOCATION: 753860
MDC IDC MSMT BATTERY IMPEDANCE: 207 Ohm
MDC IDC MSMT BATTERY VOLTAGE: 2.8 V
MDC IDC MSMT LEADCHNL RA PACING THRESHOLD AMPLITUDE: 0.625 V
MDC IDC MSMT LEADCHNL RV PACING THRESHOLD PULSEWIDTH: 0.4 ms
MDC IDC SET LEADCHNL RV PACING AMPLITUDE: 2.5 V
MDC IDC SET LEADCHNL RV PACING PULSEWIDTH: 0.4 ms

## 2016-06-29 NOTE — Progress Notes (Signed)
Remote pacemaker transmission.   

## 2016-06-30 ENCOUNTER — Encounter: Payer: Self-pay | Admitting: Cardiology

## 2016-07-05 ENCOUNTER — Other Ambulatory Visit (INDEPENDENT_AMBULATORY_CARE_PROVIDER_SITE_OTHER): Payer: Medicare HMO

## 2016-07-05 DIAGNOSIS — E118 Type 2 diabetes mellitus with unspecified complications: Secondary | ICD-10-CM | POA: Diagnosis not present

## 2016-07-05 LAB — HEMOGLOBIN A1C: HEMOGLOBIN A1C: 7.7 % — AB (ref 4.6–6.5)

## 2016-07-05 LAB — BASIC METABOLIC PANEL
BUN: 24 mg/dL — ABNORMAL HIGH (ref 6–23)
CALCIUM: 10 mg/dL (ref 8.4–10.5)
CHLORIDE: 101 meq/L (ref 96–112)
CO2: 26 meq/L (ref 19–32)
Creatinine, Ser: 0.87 mg/dL (ref 0.40–1.20)
GFR: 66.45 mL/min (ref >60.00)
GLUCOSE: 271 mg/dL — AB (ref 70–99)
POTASSIUM: 3.8 meq/L (ref 3.5–5.1)
SODIUM: 138 meq/L (ref 135–145)

## 2016-07-10 ENCOUNTER — Encounter: Payer: Self-pay | Admitting: Internal Medicine

## 2016-07-10 ENCOUNTER — Telehealth: Payer: Self-pay | Admitting: Internal Medicine

## 2016-07-10 ENCOUNTER — Ambulatory Visit (INDEPENDENT_AMBULATORY_CARE_PROVIDER_SITE_OTHER): Payer: Medicare HMO | Admitting: Internal Medicine

## 2016-07-10 DIAGNOSIS — E118 Type 2 diabetes mellitus with unspecified complications: Secondary | ICD-10-CM

## 2016-07-10 DIAGNOSIS — G8929 Other chronic pain: Secondary | ICD-10-CM

## 2016-07-10 DIAGNOSIS — I10 Essential (primary) hypertension: Secondary | ICD-10-CM | POA: Diagnosis not present

## 2016-07-10 DIAGNOSIS — M545 Low back pain: Secondary | ICD-10-CM

## 2016-07-10 DIAGNOSIS — I480 Paroxysmal atrial fibrillation: Secondary | ICD-10-CM

## 2016-07-10 MED ORDER — EMPAGLIFLOZIN-METFORMIN HCL ER 12.5-1000 MG PO TB24: 1.0000 | ORAL_TABLET | Freq: Two times a day (BID) | ORAL | 11 refills | Status: DC

## 2016-07-10 MED ORDER — AMOXICILLIN 500 MG PO CAPS: 500.0000 mg | ORAL_CAPSULE | Freq: Three times a day (TID) | ORAL | 0 refills | Status: DC

## 2016-07-10 MED ORDER — HYDROCODONE-ACETAMINOPHEN 5-325 MG PO TABS: 1.0000 | ORAL_TABLET | Freq: Four times a day (QID) | ORAL | 0 refills | Status: DC | PRN

## 2016-07-10 MED ORDER — EMPAGLIFLOZIN-METFORMIN HCL ER 12.5-1000 MG PO TB24: 1.0000 | ORAL_TABLET | ORAL | 3 refills | Status: DC

## 2016-07-10 NOTE — Telephone Encounter (Signed)
Patient states Dr. Camila Li stated he would call in an antibiotic for fluid behind the ear at her visit today.  Patient is requesting this to be sent to [facility] at [facility].

## 2016-07-10 NOTE — Telephone Encounter (Signed)
Emailed Amoxicillin Rx Thx

## 2016-07-10 NOTE — Progress Notes (Signed)
Pre visit review using our clinic review tool, if applicable. No additional management support is needed unless otherwise documented below in the visit note. 

## 2016-07-10 NOTE — Assessment & Plan Note (Signed)
Losartan, Maxzide

## 2016-07-10 NOTE — Assessment & Plan Note (Signed)
Metformin 

## 2016-07-10 NOTE — Progress Notes (Signed)
Subjective:  Patient ID: Jessica [name], female    DOB: [birth year]  Age: 83 y.o. MRN: [id number]  CC: No chief complaint on file.   HPI Jessica [name] presents for wt gain, DM, LBP, depression f/u. /o tremor in hands - not new...  Outpatient Medications Prior to Visit  Medication Sig Dispense Refill  . allopurinol (ZYLOPRIM) 100 MG tablet TAKE 1 TABLET EVERY DAY 90 tablet 3  . apixaban (ELIQUIS) 5 MG TABS tablet Take 1 tablet (5 mg total) by mouth 2 (two) times daily. 180 tablet 0  . b complex vitamins tablet Take 1 tablet by mouth daily.    . blood glucose meter kit and supplies KIT Use to test blood sugar up to three times a day, as directed by physician Dx:E11.09 1 each 0  . Cholecalciferol 1000 UNITS tablet Take 1,000 Units by mouth daily.      . furosemide (LASIX) 20 MG tablet Take 1 tablet (20 mg total) by mouth daily. Dx: E11.8. 90 tablet 3  . glimepiride (AMARYL) 1 MG tablet Take 1 tablet (1 mg total) by mouth 2 (two) times daily. 180 tablet 3  . glucose blood (ACCU-CHEK AVIVA) test strip 1 each by Other route 3 (three) times daily. Dx: E11.8 100 each 12  . HYDROcodone-acetaminophen (NORCO/VICODIN) 5-325 MG tablet Take 1 tablet by mouth every 6 (six) hours as needed for moderate pain. 120 tablet 0  . Lancets MISC Use as directed to test blood sugar up to three times a day, as directed by physician. E11.09 300 each 1  . levothyroxine (SYNTHROID, LEVOTHROID) 75 MCG tablet TAKE 1 TABLET EVERY DAY 90 tablet 3  . LORazepam (ATIVAN) 0.5 MG tablet Take 1 tablet (0.5 mg total) by mouth 2 (two) times daily. 180 tablet 1  . losartan (COZAAR) 100 MG tablet TAKE 1 TABLET EVERY DAY 90 tablet 3  . metFORMIN (GLUCOPHAGE-XR) 750 MG 24 hr tablet TAKE 1 TABLET EVERY DAY WITH BREAKFAST 90 tablet 3  . Multiple Vitamins-Minerals (OCUVITE PRESERVISION) TABS Take 1 tablet by mouth 2 (two) times daily.    . mupirocin ointment (BACTROBAN) 2 % Use qid 22 g 0  . omeprazole (PRILOSEC) 40 MG capsule  TAKE 1 CAPSULE EVERY DAY 90 capsule 3  . temazepam (RESTORIL) 15 MG capsule Take 1 capsule (15 mg total) by mouth at bedtime as needed for sleep. 90 capsule 1  . triamcinolone ointment (KENALOG) 0.5 % Apply 1 application topically 3 (three) times daily. APPLY TO AFFECTED AREA(S) 90 g 2  . triamterene-hydrochlorothiazide (MAXZIDE-25) 37.5-25 MG tablet TAKE 1 TABLET EVERY DAY 90 tablet 3  . venlafaxine XR (EFFEXOR-XR) 150 MG 24 hr capsule Take 1 capsule (150 mg total) by mouth daily with breakfast. 30 capsule 5  . venlafaxine XR (EFFEXOR-XR) 75 MG 24 hr capsule TAKE 1 CAPSULE EVERY DAY WITH BREAKFAST 90 capsule 2   No facility-administered medications prior to visit.     ROS Review of Systems  Constitutional: Positive for fatigue and unexpected weight change. Negative for activity change, appetite change and chills.  HENT: Negative for congestion, mouth sores and sinus pressure.   Eyes: Negative for visual disturbance.  Respiratory: Negative for cough and chest tightness.   Gastrointestinal: Negative for abdominal pain and nausea.  Genitourinary: Negative for difficulty urinating, frequency and vaginal pain.  Musculoskeletal: Positive for back pain and gait problem.  Skin: Negative for pallor and rash.  Neurological: Negative for dizziness, tremors, weakness, numbness and headaches.  Psychiatric/Behavioral: Negative for confusion,  sleep disturbance and suicidal ideas. The patient is nervous/anxious.     Objective:  BP 126/82 (BP Location: Right Arm, Patient Position: Sitting, Cuff Size: Large)   Pulse 99   Temp 97.6 F (36.4 C) (Oral)   Ht 5' 2" (1.575 m)   Wt 262 lb 1.9 oz (118.9 kg)   SpO2 98%   BMI 47.94 kg/m   BP Readings from Last 3 Encounters:  07/10/16 126/82  05/12/16 138/80  04/13/16 128/70    Wt Readings from Last 3 Encounters:  07/10/16 262 lb 1.9 oz (118.9 kg)  05/12/16 258 lb 4 oz (117.1 kg)  04/13/16 258 lb (117 kg)    Physical Exam  Constitutional: She  appears well-developed. No distress.  HENT:  Head: Normocephalic.  Right Ear: External ear normal.  Left Ear: External ear normal.  Nose: Nose normal.  Mouth/Throat: Oropharynx is clear and moist.  Eyes: Conjunctivae are normal. Pupils are equal, round, and reactive to light. Right eye exhibits no discharge. Left eye exhibits no discharge.  Neck: Normal range of motion. Neck supple. No JVD present. No tracheal deviation present. No thyromegaly present.  Cardiovascular: Normal rate, regular rhythm and normal heart sounds.   Pulmonary/Chest: No stridor. No respiratory distress. She has no wheezes.  Abdominal: Soft. Bowel sounds are normal. She exhibits no distension and no mass. There is no tenderness. There is no rebound and no guarding.  Musculoskeletal: She exhibits tenderness. She exhibits no edema.  Lymphadenopathy:    She has no cervical adenopathy.  Neurological: She displays normal reflexes. No cranial nerve deficit. She exhibits normal muscle tone. Coordination abnormal.  Skin: No rash noted. No erythema.  Psychiatric: She has a normal mood and affect. Her behavior is normal. Judgment and thought content normal.  Obese  Lab Results  Component Value Date   WBC 8.8 09/16/2014   HGB 14.4 09/16/2014   HCT 43.1 09/16/2014   PLT 268 09/16/2014   GLUCOSE 271 (H) 07/05/2016   CHOL 210 (H) 10/10/2012   TRIG 289 (H) 10/10/2012   HDL 39 (L) 10/10/2012   LDLDIRECT 155.9 09/30/2012   LDLCALC 113 (H) 10/10/2012   ALT 25 09/16/2014   AST 32 09/16/2014   NA 138 07/05/2016   K 3.8 07/05/2016   CL 101 07/05/2016   CREATININE 0.87 07/05/2016   BUN 24 (H) 07/05/2016   CO2 26 07/05/2016   TSH 3.592 03/03/2015   INR 3.3 08/26/2009   HGBA1C 7.7 (H) 07/05/2016   MICROALBUR 4.5 (H) 12/02/2009    No results found.  Assessment & Plan:   There are no diagnoses linked to this encounter. I am having Ms. Byard maintain her Cholecalciferol, OCUVITE PRESERVISION, b complex vitamins, blood  glucose meter kit and supplies, Lancets, glimepiride, furosemide, glucose blood, triamcinolone ointment, LORazepam, apixaban, venlafaxine XR, allopurinol, triamterene-hydrochlorothiazide, losartan, omeprazole, metFORMIN, levothyroxine, HYDROcodone-acetaminophen, mupirocin ointment, venlafaxine XR, and temazepam.  No orders of the defined types were placed in this encounter.    Follow-up: No Follow-up on file.  Walker Kehr, MD

## 2016-07-10 NOTE — Assessment & Plan Note (Signed)
Norco  Potential benefits of a long term opioids use as well as potential risks (i.e. addiction risk, apnea etc) and complications (i.e. Somnolence, constipation and others) were explained to the patient and were aknowledged. 

## 2016-07-10 NOTE — Assessment & Plan Note (Signed)
Eliquis 

## 2016-07-28 ENCOUNTER — Other Ambulatory Visit: Payer: Self-pay | Admitting: Internal Medicine

## 2016-08-01 NOTE — Telephone Encounter (Signed)
Called refill into [facility] spoke w/pharmacist Ronalee Belts gave MD approval.../lmb

## 2016-08-03 ENCOUNTER — Other Ambulatory Visit: Payer: Self-pay

## 2016-08-03 MED ORDER — LORAZEPAM 0.5 MG PO TABS: 0.5000 mg | ORAL_TABLET | Freq: Two times a day (BID) | ORAL | 1 refills | Status: DC

## 2016-08-03 NOTE — Telephone Encounter (Signed)
Fax rec'd from [facility] for refill on Lorazepam

## 2016-09-27 ENCOUNTER — Ambulatory Visit (INDEPENDENT_AMBULATORY_CARE_PROVIDER_SITE_OTHER): Payer: Medicare HMO | Admitting: *Deleted

## 2016-09-27 ENCOUNTER — Telehealth: Payer: Self-pay | Admitting: Cardiology

## 2016-09-27 DIAGNOSIS — I441 Atrioventricular block, second degree: Secondary | ICD-10-CM

## 2016-09-27 NOTE — Telephone Encounter (Signed)
Spoke with pt and reminded pt of remote transmission that is due today. Pt verbalized understanding.   

## 2016-09-28 ENCOUNTER — Encounter: Payer: Self-pay | Admitting: Cardiology

## 2016-09-28 NOTE — Progress Notes (Signed)
Remote pacemaker transmission.   

## 2016-10-09 ENCOUNTER — Other Ambulatory Visit (INDEPENDENT_AMBULATORY_CARE_PROVIDER_SITE_OTHER): Payer: Medicare HMO

## 2016-10-09 DIAGNOSIS — E118 Type 2 diabetes mellitus with unspecified complications: Secondary | ICD-10-CM | POA: Diagnosis not present

## 2016-10-09 LAB — BASIC METABOLIC PANEL
BUN: 18 mg/dL (ref 6–23)
CALCIUM: 10 mg/dL (ref 8.4–10.5)
CHLORIDE: 101 meq/L (ref 96–112)
CO2: 29 mEq/L (ref 19–32)
CREATININE: 1.02 mg/dL (ref 0.40–1.20)
GFR: 55.27 mL/min — ABNORMAL LOW (ref >60.00)
GLUCOSE: 117 mg/dL — AB (ref 70–99)
Potassium: 4.3 mEq/L (ref 3.5–5.1)
Sodium: 137 mEq/L (ref 135–145)

## 2016-10-09 LAB — HEMOGLOBIN A1C: HEMOGLOBIN A1C: 7.5 % — AB (ref 4.6–6.5)

## 2016-10-13 ENCOUNTER — Ambulatory Visit (INDEPENDENT_AMBULATORY_CARE_PROVIDER_SITE_OTHER): Payer: Medicare HMO | Admitting: Internal Medicine

## 2016-10-13 ENCOUNTER — Encounter: Payer: Self-pay | Admitting: Internal Medicine

## 2016-10-13 DIAGNOSIS — K068 Other specified disorders of gingiva and edentulous alveolar ridge: Secondary | ICD-10-CM | POA: Diagnosis not present

## 2016-10-13 DIAGNOSIS — I872 Venous insufficiency (chronic) (peripheral): Secondary | ICD-10-CM | POA: Diagnosis not present

## 2016-10-13 DIAGNOSIS — F411 Generalized anxiety disorder: Secondary | ICD-10-CM | POA: Diagnosis not present

## 2016-10-13 DIAGNOSIS — E118 Type 2 diabetes mellitus with unspecified complications: Secondary | ICD-10-CM | POA: Diagnosis not present

## 2016-10-13 DIAGNOSIS — I10 Essential (primary) hypertension: Secondary | ICD-10-CM

## 2016-10-13 MED ORDER — TRIAMCINOLONE ACETONIDE 0.1 % MT PSTE: 1.0000 "application " | PASTE | Freq: Three times a day (TID) | OROMUCOSAL | 1 refills | Status: DC

## 2016-10-13 MED ORDER — TRIAMCINOLONE ACETONIDE 0.5 % EX OINT: 1.0000 "application " | TOPICAL_OINTMENT | Freq: Three times a day (TID) | CUTANEOUS | 2 refills | Status: DC

## 2016-10-13 NOTE — Assessment & Plan Note (Addendum)
Worse - discussed Elevate legs Triamc oint

## 2016-10-13 NOTE — Assessment & Plan Note (Signed)
Labs Metformin 

## 2016-10-13 NOTE — Assessment & Plan Note (Signed)
Lower gum ?polyp Dental f/u Kenalog paste

## 2016-10-13 NOTE — Assessment & Plan Note (Signed)
Risks associated with diet and treatment noncompliance were discussed. Compliance was encouraged. Triamt/HCTZ, Losartan

## 2016-10-13 NOTE — Assessment & Plan Note (Signed)
Wt Readings from Last 3 Encounters:  10/13/16 255 lb (115.7 kg)  07/10/16 262 lb 1.9 oz (118.9 kg)  05/12/16 258 lb 4 oz (117.1 kg)

## 2016-10-13 NOTE — Progress Notes (Signed)
Subjective:  Patient ID: Jessica [name], female    DOB: May 11, 1933  Age: 83 y.o. MRN: [id number]  CC: No chief complaint on file.   HPI Jessica [name] presents for gout, A fib, hypothyroidism C/o LLE redness C/o a mouth sore x 1 mo  Outpatient Medications Prior to Visit  Medication Sig Dispense Refill  . allopurinol (ZYLOPRIM) 100 MG tablet TAKE 1 TABLET EVERY DAY 90 tablet 3  . amoxicillin (AMOXIL) 500 MG capsule Take 1 capsule (500 mg total) by mouth 3 (three) times daily. 30 capsule 0  . apixaban (ELIQUIS) 5 MG TABS tablet Take 1 tablet (5 mg total) by mouth 2 (two) times daily. 180 tablet 0  . b complex vitamins tablet Take 1 tablet by mouth daily.    . blood glucose meter kit and supplies KIT Use to test blood sugar up to three times a day, as directed by physician Dx:E11.09 1 each 0  . Cholecalciferol 1000 UNITS tablet Take 1,000 Units by mouth daily.      . Empagliflozin-Metformin HCl ER (SYNJARDY XR) 12.08-998 MG TB24 Take 1 tablet by mouth every morning. 90 tablet 3  . furosemide (LASIX) 20 MG tablet Take 1 tablet (20 mg total) by mouth daily. Dx: E11.8. 90 tablet 3  . glimepiride (AMARYL) 1 MG tablet Take 1 tablet (1 mg total) by mouth 2 (two) times daily. 180 tablet 3  . glucose blood (ACCU-CHEK AVIVA) test strip 1 each by Other route 3 (three) times daily. Dx: E11.8 100 each 12  . HYDROcodone-acetaminophen (NORCO/VICODIN) 5-325 MG tablet Take 1 tablet by mouth every 6 (six) hours as needed for moderate pain. 120 tablet 0  . Lancets MISC Use as directed to test blood sugar up to three times a day, as directed by physician. E11.09 300 each 1  . levothyroxine (SYNTHROID, LEVOTHROID) 75 MCG tablet TAKE 1 TABLET EVERY DAY 90 tablet 3  . LORazepam (ATIVAN) 0.5 MG tablet Take 1 tablet (0.5 mg total) by mouth 2 (two) times daily. 180 tablet 1  . losartan (COZAAR) 100 MG tablet TAKE 1 TABLET EVERY DAY 90 tablet 3  . Multiple Vitamins-Minerals (OCUVITE PRESERVISION) TABS Take  1 tablet by mouth 2 (two) times daily.    [name] omeprazole (PRILOSEC) 40 MG capsule TAKE 1 CAPSULE EVERY DAY 90 capsule 3  . temazepam (RESTORIL) 15 MG capsule TAKE 1 CAPSULE AT BEDTIME AS NEEDED FOR SLEEP 90 capsule 1  . triamcinolone ointment (KENALOG) 0.5 % Apply 1 application topically 3 (three) times daily. APPLY TO AFFECTED AREA(S) 90 g 2  . triamterene-hydrochlorothiazide (MAXZIDE-25) 37.5-25 MG tablet TAKE 1 TABLET EVERY DAY 90 tablet 3  . venlafaxine XR (EFFEXOR-XR) 75 MG 24 hr capsule TAKE 1 CAPSULE EVERY DAY WITH BREAKFAST 90 capsule 2  . mupirocin ointment (BACTROBAN) 2 % Use qid 22 g 0   No facility-administered medications prior to visit.     ROS Review of Systems  Constitutional: Negative for activity change, appetite change, chills, fatigue and unexpected weight change.  HENT: Negative for congestion, mouth sores and sinus pressure.   Eyes: Negative for visual disturbance.  Respiratory: Negative for cough and chest tightness.   Gastrointestinal: Negative for abdominal pain and nausea.  Genitourinary: Negative for difficulty urinating, frequency and vaginal pain.  Musculoskeletal: Positive for back pain and gait problem.  Skin: Positive for color change and wound. Negative for pallor and rash.  Neurological: Negative for dizziness, tremors, weakness, numbness and headaches.  Psychiatric/Behavioral: Negative for confusion, sleep disturbance  and suicidal ideas.    Objective:  BP 126/78 (BP Location: Left Arm, Patient Position: Sitting, Cuff Size: Large)   Pulse 86   Temp 98.6 F (37 C) (Oral)   Ht '5\' 2"'  (1.575 m)   Wt 255 lb (115.7 kg)   SpO2 98%   BMI 46.64 kg/m   BP Readings from Last 3 Encounters:  10/13/16 126/78  07/10/16 126/82  05/12/16 138/80    Wt Readings from Last 3 Encounters:  10/13/16 255 lb (115.7 kg)  07/10/16 262 lb 1.9 oz (118.9 kg)  05/12/16 258 lb 4 oz (117.1 kg)    Physical Exam  Constitutional: She appears well-developed. No distress.    HENT:  Head: Normocephalic.  Right Ear: External ear normal.  Left Ear: External ear normal.  Nose: Nose normal.  Mouth/Throat: Oropharynx is clear and moist.  Eyes: Pupils are equal, round, and reactive to light. Conjunctivae are normal. Right eye exhibits no discharge. Left eye exhibits no discharge.  Neck: Normal range of motion. Neck supple. No JVD present. No tracheal deviation present. No thyromegaly present.  Cardiovascular: Normal rate, regular rhythm and normal heart sounds.   Pulmonary/Chest: No stridor. No respiratory distress. She has no wheezes.  Abdominal: Soft. Bowel sounds are normal. She exhibits no distension and no mass. There is no tenderness. There is no rebound and no guarding.  Musculoskeletal: She exhibits edema and tenderness.  Lymphadenopathy:    She has no cervical adenopathy.  Neurological: She displays normal reflexes. No cranial nerve deficit. She exhibits normal muscle tone. Coordination abnormal.  Skin: No rash noted. No erythema.  Psychiatric: She has a normal mood and affect. Her behavior is normal. Judgment and thought content normal.  L 2 mm laceration on L foot 3d toe dressed Purple shins w/trace edema - re-dressed L>R 6x5 mm polypoid flat growth - lower gum  Lab Results  Component Value Date   WBC 8.8 09/16/2014   HGB 14.4 09/16/2014   HCT 43.1 09/16/2014   PLT 268 09/16/2014   GLUCOSE 117 (H) 10/09/2016   CHOL 210 (H) 10/10/2012   TRIG 289 (H) 10/10/2012   HDL 39 (L) 10/10/2012   LDLDIRECT 155.9 09/30/2012   LDLCALC 113 (H) 10/10/2012   ALT 25 09/16/2014   AST 32 09/16/2014   NA 137 10/09/2016   K 4.3 10/09/2016   CL 101 10/09/2016   CREATININE 1.02 10/09/2016   BUN 18 10/09/2016   CO2 29 10/09/2016   TSH 3.592 03/03/2015   INR 3.3 08/26/2009   HGBA1C 7.5 (H) 10/09/2016   MICROALBUR 4.5 (H) 12/02/2009    No results found.  Assessment & Plan:   There are no diagnoses linked to this encounter. I have discontinued [name]'s mupirocin ointment. I am also having her maintain her Cholecalciferol, OCUVITE PRESERVISION, b complex vitamins, blood glucose meter kit and supplies, Lancets, glimepiride, furosemide, glucose blood, triamcinolone ointment, apixaban, allopurinol, triamterene-hydrochlorothiazide, losartan, omeprazole, levothyroxine, venlafaxine XR, HYDROcodone-acetaminophen, Empagliflozin-Metformin HCl ER, amoxicillin, temazepam, and LORazepam.  No orders of the defined types were placed in this encounter.    Follow-up: No Follow-up on file.  Walker Kehr, MD

## 2016-10-13 NOTE — Assessment & Plan Note (Signed)
Temazepam prn 

## 2016-10-20 ENCOUNTER — Telehealth: Payer: Self-pay | Admitting: Internal Medicine

## 2016-10-20 DIAGNOSIS — K1379 Other lesions of oral mucosa: Secondary | ICD-10-CM

## 2016-10-20 NOTE — Telephone Encounter (Signed)
Ok Thx 

## 2016-10-20 NOTE — Telephone Encounter (Signed)
Pt called stating that she is needing a referral to an oral surgeon. She said that she spoke with her insurance company and they are needing Dr Alain Marion to call and authorize this so that Mcarth[facility] Rossetti will pay for it ([phone]). She has a nodule inside of her mouth and it was recommended by Dr Alain Marion that she see an oral Psychologist, sport and exercise.

## 2016-10-24 LAB — CUP PACEART REMOTE DEVICE CHECK
Battery Impedance: 231 Ohm
Battery Voltage: 2.79 V
Brady Statistic AP VS Percent: 0 %
Brady Statistic AS VP Percent: 98 %
Implantable Lead Implant Date: 20140710
Implantable Lead Location: 753860
Implantable Lead Model: 5092
Implantable Lead Model: 5592
Implantable Pulse Generator Implant Date: 20140710
Lead Channel Impedance Value: 745 Ohm
Lead Channel Pacing Threshold Amplitude: 0.625 V
Lead Channel Pacing Threshold Pulse Width: 0.4 ms
Lead Channel Setting Pacing Amplitude: 2.5 V
Lead Channel Setting Pacing Pulse Width: 0.4 ms
MDC IDC LEAD IMPLANT DT: 20140710
MDC IDC LEAD LOCATION: 753859
MDC IDC MSMT BATTERY REMAINING LONGEVITY: 114 mo
MDC IDC MSMT LEADCHNL RA IMPEDANCE VALUE: 568 Ohm
MDC IDC MSMT LEADCHNL RV PACING THRESHOLD AMPLITUDE: 0.375 V
MDC IDC MSMT LEADCHNL RV PACING THRESHOLD PULSEWIDTH: 0.4 ms
MDC IDC SESS DTM: 20180627150340
MDC IDC SET LEADCHNL RA PACING AMPLITUDE: 2 V
MDC IDC SET LEADCHNL RV SENSING SENSITIVITY: 4 mV
MDC IDC STAT BRADY AP VP PERCENT: 1 %
MDC IDC STAT BRADY AS VS PERCENT: 1 %

## 2016-10-27 ENCOUNTER — Other Ambulatory Visit: Payer: Self-pay

## 2016-10-27 MED ORDER — FUROSEMIDE 20 MG PO TABS: 20.0000 mg | ORAL_TABLET | Freq: Every day | ORAL | 3 refills | Status: DC

## 2016-10-27 MED ORDER — TRIAMCINOLONE ACETONIDE 0.5 % EX OINT: 1.0000 "application " | TOPICAL_OINTMENT | Freq: Three times a day (TID) | CUTANEOUS | 2 refills | Status: DC

## 2016-11-10 ENCOUNTER — Ambulatory Visit (INDEPENDENT_AMBULATORY_CARE_PROVIDER_SITE_OTHER): Payer: Medicare HMO | Admitting: Internal Medicine

## 2016-11-10 ENCOUNTER — Encounter: Payer: Self-pay | Admitting: Internal Medicine

## 2016-11-10 DIAGNOSIS — I872 Venous insufficiency (chronic) (peripheral): Secondary | ICD-10-CM

## 2016-11-10 DIAGNOSIS — G8929 Other chronic pain: Secondary | ICD-10-CM | POA: Diagnosis not present

## 2016-11-10 DIAGNOSIS — L52 Erythema nodosum: Secondary | ICD-10-CM | POA: Insufficient documentation

## 2016-11-10 DIAGNOSIS — M545 Low back pain: Secondary | ICD-10-CM

## 2016-11-10 DIAGNOSIS — E118 Type 2 diabetes mellitus with unspecified complications: Secondary | ICD-10-CM

## 2016-11-10 MED ORDER — METHYLPREDNISOLONE 4 MG PO TBPK: ORAL_TABLET | ORAL | 0 refills | Status: DC

## 2016-11-10 MED ORDER — DOXYCYCLINE HYCLATE 100 MG PO TABS: 100.0000 mg | ORAL_TABLET | Freq: Two times a day (BID) | ORAL | 0 refills | Status: DC

## 2016-11-10 NOTE — Assessment & Plan Note (Signed)
No change 

## 2016-11-10 NOTE — Assessment & Plan Note (Signed)
Metformin 

## 2016-11-10 NOTE — Assessment & Plan Note (Signed)
EN vs cellulitis POC [place] Empiric Doxy Medrol pack

## 2016-11-10 NOTE — Assessment & Plan Note (Signed)
Norco prn 

## 2016-11-10 NOTE — Progress Notes (Signed)
Subjective:  Patient ID: Jessica [name], female    DOB: Jul 22, 1933  Age: 83 y.o. MRN: [id number]  CC: No chief complaint on file.   HPI Jessica [name] presents for L anterior shin redness spreading  Outpatient Medications Prior to Visit  Medication Sig Dispense Refill  . allopurinol (ZYLOPRIM) 100 MG tablet TAKE 1 TABLET EVERY DAY 90 tablet 3  . amoxicillin (AMOXIL) 500 MG capsule Take 1 capsule (500 mg total) by mouth 3 (three) times daily. 30 capsule 0  . apixaban (ELIQUIS) 5 MG TABS tablet Take 1 tablet (5 mg total) by mouth 2 (two) times daily. 180 tablet 0  . b complex vitamins tablet Take 1 tablet by mouth daily.    . blood glucose meter kit and supplies KIT Use to test blood sugar up to three times a day, as directed by physician Dx:E11.09 1 each 0  . Cholecalciferol 1000 UNITS tablet Take 1,000 Units by mouth daily.      . Empagliflozin-Metformin HCl ER (SYNJARDY XR) 12.08-998 MG TB24 Take 1 tablet by mouth every morning. 90 tablet 3  . furosemide (LASIX) 20 MG tablet Take 1 tablet (20 mg total) by mouth daily. Dx: E11.8. 90 tablet 3  . glimepiride (AMARYL) 1 MG tablet Take 1 tablet (1 mg total) by mouth 2 (two) times daily. 180 tablet 3  . glucose blood (ACCU-CHEK AVIVA) test strip 1 each by Other route 3 (three) times daily. Dx: E11.8 100 each 12  . HYDROcodone-acetaminophen (NORCO/VICODIN) 5-325 MG tablet Take 1 tablet by mouth every 6 (six) hours as needed for moderate pain. 120 tablet 0  . Lancets MISC Use as directed to test blood sugar up to three times a day, as directed by physician. E11.09 300 each 1  . levothyroxine (SYNTHROID, LEVOTHROID) 75 MCG tablet TAKE 1 TABLET EVERY DAY 90 tablet 3  . LORazepam (ATIVAN) 0.5 MG tablet Take 1 tablet (0.5 mg total) by mouth 2 (two) times daily. 180 tablet 1  . losartan (COZAAR) 100 MG tablet TAKE 1 TABLET EVERY DAY 90 tablet 3  . Multiple Vitamins-Minerals (OCUVITE PRESERVISION) TABS Take 1 tablet by mouth 2 (two) times  daily.    [name] omeprazole (PRILOSEC) 40 MG capsule TAKE 1 CAPSULE EVERY DAY 90 capsule 3  . temazepam (RESTORIL) 15 MG capsule TAKE 1 CAPSULE AT BEDTIME AS NEEDED FOR SLEEP 90 capsule 1  . triamcinolone (KENALOG) 0.1 % paste Use as directed 1 application in the mouth or throat 3 (three) times daily. Gum sore 5 g 1  . triamcinolone ointment (KENALOG) 0.5 % Apply 1 application topically 3 (three) times daily. APPLY TO AFFECTED AREA(S) 90 g 2  . triamterene-hydrochlorothiazide (MAXZIDE-25) 37.5-25 MG tablet TAKE 1 TABLET EVERY DAY 90 tablet 3  . venlafaxine XR (EFFEXOR-XR) 75 MG 24 hr capsule TAKE 1 CAPSULE EVERY DAY WITH BREAKFAST 90 capsule 2   No facility-administered medications prior to visit.     ROS Review of Systems  Constitutional: Negative for activity change, appetite change, chills, fatigue and unexpected weight change.  HENT: Negative for congestion, mouth sores and sinus pressure.   Eyes: Negative for visual disturbance.  Respiratory: Negative for cough and chest tightness.   Gastrointestinal: Negative for abdominal pain and nausea.  Genitourinary: Negative for difficulty urinating, frequency and vaginal pain.  Musculoskeletal: Positive for back pain and gait problem.  Skin: Positive for color change and rash. Negative for pallor.  Neurological: Negative for dizziness, tremors, weakness, numbness and headaches.  Psychiatric/Behavioral: Negative for  confusion and sleep disturbance.    Objective:  BP 122/74 (BP Location: Left Arm, Patient Position: Sitting, Cuff Size: Large)   Pulse 82   Temp 97.8 F (36.6 C) (Oral)   Ht _0  (1.575 m)   Wt 253 lb (114.8 kg)   SpO2 98%   BMI 46.27 kg/m   BP Readings from Last 3 Encounters:  11/10/16 122/74  10/13/16 126/78  07/10/16 126/82    Wt Readings from Last 3 Encounters:  11/10/16 253 lb (114.8 kg)  10/13/16 255 lb (115.7 kg)  07/10/16 262 lb 1.9 oz (118.9 kg)    Physical Exam  Constitutional: She appears well-developed.  No distress.  HENT:  Head: Normocephalic.  Right Ear: External ear normal.  Left Ear: External ear normal.  Nose: Nose normal.  Mouth/Throat: Oropharynx is clear and moist.  Eyes: Pupils are equal, round, and reactive to light. Conjunctivae are normal. Right eye exhibits no discharge. Left eye exhibits no discharge.  Neck: Normal range of motion. Neck supple. No JVD present. No tracheal deviation present. No thyromegaly present.  Cardiovascular: Normal rate, regular rhythm and normal heart sounds.   Pulmonary/Chest: No stridor. No respiratory distress. She has no wheezes.  Abdominal: Soft. Bowel sounds are normal. She exhibits no distension and no mass. There is no tenderness. There is no rebound and no guarding.  Musculoskeletal: She exhibits edema and tenderness.  Lymphadenopathy:    She has no cervical adenopathy.  Neurological: She displays normal reflexes. No cranial nerve deficit. She exhibits normal muscle tone. Coordination normal.  Skin: No rash noted. There is erythema.  Psychiatric: She has a normal mood and affect. Her behavior is normal. Judgment and thought content normal.    Painful eryth nodules over L and and post shin  Procedure Note :    Procedure :   Point of care (POC) sonography examination   Indication: swelling   Equipment used: Lumify linear probe. The images were stored in the unit and later transferred in storage.     Impression: changes c/w cellulitis. Superficial veins are compressible      Lab Results  Component Value Date   WBC 8.8 09/16/2014   HGB 14.4 09/16/2014   HCT 43.1 09/16/2014   PLT 268 09/16/2014   GLUCOSE 117 (H) 10/09/2016   CHOL 210 (H) 10/10/2012   TRIG 289 (H) 10/10/2012   HDL 39 (L) 10/10/2012   LDLDIRECT 155.9 09/30/2012   LDLCALC 113 (H) 10/10/2012   ALT 25 09/16/2014   AST 32 09/16/2014   NA 137 10/09/2016   K 4.3 10/09/2016   CL 101 10/09/2016   CREATININE 1.02 10/09/2016   BUN 18 10/09/2016   CO2 29  10/09/2016   TSH 3.592 03/03/2015   INR 3.3 08/26/2009   HGBA1C 7.5 (H) 10/09/2016   MICROALBUR 4.5 (H) 12/02/2009    No results found.  Assessment & Plan:   There are no diagnoses linked to this encounter. I am having Ms. Presti maintain her Cholecalciferol, OCUVITE PRESERVISION, b complex vitamins, blood glucose meter kit and supplies, Lancets, glimepiride, glucose blood, apixaban, allopurinol, triamterene-hydrochlorothiazide, losartan, omeprazole, levothyroxine, venlafaxine XR, HYDROcodone-acetaminophen, Empagliflozin-Metformin HCl ER, amoxicillin, temazepam, LORazepam, triamcinolone, furosemide, and triamcinolone ointment.  No orders of the defined types were placed in this encounter.    Follow-up: No Follow-up on file.  Walker Kehr, MD

## 2016-11-24 ENCOUNTER — Telehealth: Payer: Self-pay | Admitting: Internal Medicine

## 2016-11-24 DIAGNOSIS — R21 Rash and other nonspecific skin eruption: Secondary | ICD-10-CM

## 2016-11-24 MED ORDER — VASCULERA PO TABS: 1.0000 | ORAL_TABLET | Freq: Two times a day (BID) | ORAL | 11 refills | Status: DC

## 2016-11-24 NOTE — Telephone Encounter (Signed)
Pt called stating that her leg has gotten worse since she was seen by Dr Alain Marion on August 10th. She took the antibiotic but it caused her to have a yeast infection so she decided she did not need to take it anymore. She did not say how many days of it she took. Please advise on what she needs to do.

## 2016-11-24 NOTE — Telephone Encounter (Signed)
Keep leg elevated Loose wt Will ref to see a dermatologist Thx

## 2016-11-28 NOTE — Telephone Encounter (Signed)
LM notifying pt

## 2016-11-30 ENCOUNTER — Ambulatory Visit (INDEPENDENT_AMBULATORY_CARE_PROVIDER_SITE_OTHER): Payer: Medicare HMO | Admitting: Family Medicine

## 2016-11-30 ENCOUNTER — Encounter: Payer: Self-pay | Admitting: Family Medicine

## 2016-11-30 VITALS — BP 138/80 | HR 84 | Temp 98.0°F | Ht 62.0 in | Wt 254.0 lb

## 2016-11-30 DIAGNOSIS — L03116 Cellulitis of left lower limb: Secondary | ICD-10-CM

## 2016-11-30 MED ORDER — CLINDAMYCIN HCL 150 MG PO CAPS: 150.0000 mg | ORAL_CAPSULE | Freq: Three times a day (TID) | ORAL | 0 refills | Status: DC
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
## 2016-11-30 NOTE — Patient Instructions (Addendum)
Thank you for coming in,   Please take the antibiotic. Please follow-up sooner than later if her symptoms do not improve. Please take a probiotic with the antibiotic.   Please feel free to call with any questions or concerns at any time, at [phone]. --Dr. Raeford Razor

## 2016-11-30 NOTE — Assessment & Plan Note (Signed)
This appears to be an infection. Possible for erythema nodosum or venous stasis but having significant pain. Has been on doxy and prednisone with some improvement. Has no had any improvement in the steroid cream that she has tried.  - try clindamycin  - if no improvement may need a referral to wound center for the consideration of unna boot to help with healing.  - given indications for return.

## 2016-12-06 ENCOUNTER — Telehealth: Payer: Self-pay | Admitting: Internal Medicine

## 2016-12-06 NOTE — Telephone Encounter (Signed)
Pt called stating that her leg did not seem to be getting any better. She said that she read in one of her medical books that there is some sort of blood work that may be able to be done to check to see what is going on. I put the pt on hold and was talking with Inez Catalina about the situation. It was suggested to send a message back to see what Dr Alain Marion thought. When I went back over the the pt she was no longer on the line. I called her back and she said that while she was on hold, she had a call from our office come through and she went over to answer it. Cecille Rubin was calling to schedule an appointment for her with Dr Alain Marion. She said that she is fine to wait until Friday to see him. I did mention to her that if she feels that she needs to be seen sooner she can always go to the ED. She expressed understanding.

## 2016-12-06 NOTE — Telephone Encounter (Signed)
Spoke to pt regarding a dermatology referral and she states her leg is still not getting any better. She has been reading the book "Complete Medical Guide" and she is now afraid that whatever this is, is going to get into her blood stream or lymph nodes. She's still taking the antibiotics Dr. Raeford Razor gave her last week and she was unable to fill another prescription because of the cost. I went ahead and made her an appointment. She will be coming in on Friday.   Does she still need this dermatology referral?

## 2016-12-06 NOTE — Telephone Encounter (Signed)
Noted. Thx.

## 2016-12-08 ENCOUNTER — Other Ambulatory Visit (INDEPENDENT_AMBULATORY_CARE_PROVIDER_SITE_OTHER): Payer: Medicare HMO

## 2016-12-08 ENCOUNTER — Ambulatory Visit (INDEPENDENT_AMBULATORY_CARE_PROVIDER_SITE_OTHER): Payer: Medicare HMO | Admitting: Internal Medicine

## 2016-12-08 ENCOUNTER — Encounter: Payer: Self-pay | Admitting: Internal Medicine

## 2016-12-08 VITALS — BP 132/82 | HR 89 | Temp 98.2°F | Ht 62.0 in | Wt 256.0 lb

## 2016-12-08 DIAGNOSIS — L52 Erythema nodosum: Secondary | ICD-10-CM

## 2016-12-08 DIAGNOSIS — E118 Type 2 diabetes mellitus with unspecified complications: Secondary | ICD-10-CM | POA: Diagnosis not present

## 2016-12-08 DIAGNOSIS — I872 Venous insufficiency (chronic) (peripheral): Secondary | ICD-10-CM | POA: Diagnosis not present

## 2016-12-08 DIAGNOSIS — I4892 Unspecified atrial flutter: Secondary | ICD-10-CM

## 2016-12-08 LAB — CBC WITH DIFFERENTIAL/PLATELET
BASOS ABS: 0 10*3/uL (ref 0.0–0.1)
Basophils Relative: 0.4 % (ref 0.0–3.0)
Eosinophils Absolute: 0.1 10*3/uL (ref 0.0–0.7)
Eosinophils Relative: 1.3 % (ref 0.0–5.0)
HCT: 46.3 % — ABNORMAL HIGH (ref 36.0–46.0)
HEMOGLOBIN: 15.3 g/dL — AB (ref 12.0–15.0)
Lymphocytes Relative: 34.7 % (ref 12.0–46.0)
Lymphs Abs: 2.9 10*3/uL (ref 0.7–4.0)
MCHC: 33.1 g/dL (ref 30.0–36.0)
MCV: 91.8 fl (ref 78.0–100.0)
MONO ABS: 0.8 10*3/uL (ref 0.1–1.0)
MONOS PCT: 9.9 % (ref 3.0–12.0)
NEUTROS PCT: 53.7 % (ref 43.0–77.0)
Neutro Abs: 4.6 10*3/uL (ref 1.4–7.7)
Platelets: 369 10*3/uL (ref 150.0–400.0)
RBC: 5.04 Mil/uL (ref 3.87–5.11)
RDW: 13.5 % (ref 11.5–15.5)
WBC: 8.5 10*3/uL (ref 4.0–10.5)

## 2016-12-08 LAB — HEPATIC FUNCTION PANEL
ALT: 22 U/L (ref 0–35)
AST: 22 U/L (ref 0–37)
Albumin: 4.3 g/dL (ref 3.5–5.2)
Alkaline Phosphatase: 88 U/L (ref 39–117)
BILIRUBIN DIRECT: 0.1 mg/dL (ref 0.0–0.3)
Total Bilirubin: 0.5 mg/dL (ref 0.2–1.2)
Total Protein: 8.4 g/dL — ABNORMAL HIGH (ref 6.0–8.3)

## 2016-12-08 LAB — SEDIMENTATION RATE: SED RATE: 42 mm/h — AB (ref 0–30)

## 2016-12-08 LAB — BASIC METABOLIC PANEL
BUN: 18 mg/dL (ref 6–23)
CO2: 30 meq/L (ref 19–32)
Calcium: 10.5 mg/dL (ref 8.4–10.5)
Chloride: 99 mEq/L (ref 96–112)
Creatinine, Ser: 0.9 mg/dL (ref 0.40–1.20)
GFR: 63.83 mL/min (ref >60.00)
Glucose, Bld: 102 mg/dL — ABNORMAL HIGH (ref 70–99)
Potassium: 3.9 mEq/L (ref 3.5–5.1)
SODIUM: 139 meq/L (ref 135–145)

## 2016-12-08 LAB — T4, FREE: FREE T4: 0.91 ng/dL (ref 0.60–1.60)

## 2016-12-08 LAB — TSH: TSH: 3.99 u[IU]/mL (ref 0.35–4.50)

## 2016-12-08 MED ORDER — VASCULERA PO TABS: 1.0000 | ORAL_TABLET | Freq: Every day | ORAL | 3 refills | Status: DC

## 2016-12-08 MED ORDER — APIXABAN 5 MG PO TABS: 5.0000 mg | ORAL_TABLET | Freq: Two times a day (BID) | ORAL | 1 refills | Status: DC

## 2016-12-08 NOTE — Assessment & Plan Note (Signed)
Eliquis 

## 2016-12-08 NOTE — Assessment & Plan Note (Signed)
[facility] ref

## 2016-12-08 NOTE — Assessment & Plan Note (Addendum)
  Erythema Nodosum vs venous insufficiency [facility] ref

## 2016-12-08 NOTE — Assessment & Plan Note (Signed)
Cont w/metformin 

## 2016-12-08 NOTE — Patient Instructions (Addendum)
Vasculera 1 a day      Chronic Venous Insufficiency Chronic venous insufficiency, also called venous stasis, is a condition that prevents blood from being pumped effectively through the veins in your legs. Blood may no longer be pumped effectively from the legs back to the heart. This condition can range from mild to severe. With proper treatment, you should be able to continue with an active life. What are the causes? Chronic venous insufficiency occurs when the vein walls become stretched, weakened, or damaged, or when valves within the vein are damaged. Some common causes of this include:  High blood pressure inside the veins (venous hypertension).  Increased blood pressure in the leg veins from long periods of sitting or standing.  A blood clot that blocks blood flow in a vein (deep vein thrombosis, DVT).  Inflammation of a vein (phlebitis) that causes a blood clot to form.  Tumors in the pelvis that cause blood to back up.  What increases the risk? The following factors may make you more likely to develop this condition:  Having a family history of this condition.  Obesity.  Pregnancy.  Living without enough physical activity or exercise (sedentary lifestyle).  Smoking.  Having a job that requires long periods of standing or sitting in one place.  Being a certain age. Women in their 29s and 32s and men in their 67s are more likely to develop this condition.  What are the signs or symptoms? Symptoms of this condition include:  Veins that are enlarged, bulging, or twisted (varicose veins).  Skin breakdown or ulcers.  Reddened or discolored skin on the front of the leg.  Brown, smooth, tight, and painful skin just above the ankle, usually on the inside of the leg (lipodermatosclerosis).  Swelling.  How is this diagnosed? This condition may be diagnosed based on:  Your medical history.  A physical exam.  Tests, such as: ? A procedure that creates an image  of a blood vessel and nearby organs and provides information about blood flow through the blood vessel (duplex ultrasound). ? A procedure that tests blood flow (plethysmography). ? A procedure to look at the veins using X-ray and dye (venogram).  How is this treated? The goals of treatment are to help you return to an active life and to minimize pain or disability. Treatment depends on the severity of your condition, and it may include:  Wearing compression stockings. These can help relieve symptoms and help prevent your condition from getting worse. However, they do not cure the condition.  Sclerotherapy. This is a procedure involving an injection of a material that "dissolves" damaged veins.  Surgery. This may involve: ? Removing a diseased vein (vein stripping). ? Cutting off blood flow through the vein (laser ablation surgery). ? Repairing a valve.  Follow these instructions at home:  Wear compression stockings as told by your health care provider. These stockings help to prevent blood clots and reduce swelling in your legs.  Take over-the-counter and prescription medicines only as told by your health care provider.  Stay active by exercising, walking, or doing different activities. Ask your health care provider what activities are safe for you and how much exercise you need.  Drink enough fluid to keep your urine clear or pale yellow.  Do not use any products that contain nicotine or tobacco, such as cigarettes and e-cigarettes. If you need help quitting, ask your health care provider.  Keep all follow-up visits as told by your health care provider. This is  important. Contact a health care provider if:  You have redness, swelling, or more pain in the affected area.  You see a red streak or line that extends up or down from the affected area.  You have skin breakdown or a loss of skin in the affected area, even if the breakdown is small.  You get an injury in the affected  area. Get help right away if:  You get an injury and an open wound in the affected area.  You have severe pain that does not get better with medicine.  You have sudden numbness or weakness in the foot or ankle below the affected area, or you have trouble moving your foot or ankle.  You have a fever and you have worse or persistent symptoms.  You have chest pain.  You have shortness of breath. Summary  Chronic venous insufficiency, also called venous stasis, is a condition that prevents blood from being pumped effectively through the veins in your legs.  Chronic venous insufficiency occurs when the vein walls become stretched, weakened, or damaged, or when valves within the vein are damaged.  Treatment for this condition depends on how severe your condition is, and it may involve wearing compression stockings or having a procedure.  Make sure you stay active by exercising, walking, or doing different activities. Ask your health care provider what activities are safe for you and how much exercise you need. This information is not intended to replace advice given to you by your health care provider. Make sure you discuss any questions you have with your health care provider. Document Released: 07/24/2006 Document Revised: 02/07/2016 Document Reviewed: 02/07/2016 Elsevier Interactive Patient Education  2017 [employer].

## 2016-12-08 NOTE — Progress Notes (Signed)
Subjective:  Patient ID: Jessica [name], female    DOB: 10/02/1933  Age: 83 y.o. MRN: [id number]  CC: No chief complaint on file.   HPI Jessica [name] presents for LLE swelling and redness with pain - not better  Outpatient Medications Prior to Visit  Medication Sig Dispense Refill  . allopurinol (ZYLOPRIM) 100 MG tablet TAKE 1 TABLET EVERY DAY 90 tablet 3  . amoxicillin (AMOXIL) 500 MG capsule Take 1 capsule (500 mg total) by mouth 3 (three) times daily. 30 capsule 0  . apixaban (ELIQUIS) 5 MG TABS tablet Take 1 tablet (5 mg total) by mouth 2 (two) times daily. 180 tablet 0  . b complex vitamins tablet Take 1 tablet by mouth daily.    . blood glucose meter kit and supplies KIT Use to test blood sugar up to three times a day, as directed by physician Dx:E11.09 1 each 0  . Cholecalciferol 1000 UNITS tablet Take 1,000 Units by mouth daily.      . clindamycin (CLEOCIN) 150 MG capsule Take 1 capsule (150 mg total) by mouth 3 (three) times daily. For a total of 7 days 21 capsule 0  . Dietary Management Product (VASCULERA) TABS Take 1 tablet by mouth 2 (two) times daily. 30 tablet 11  . doxycycline (VIBRA-TABS) 100 MG tablet Take 1 tablet (100 mg total) by mouth 2 (two) times daily. 20 tablet 0  . Empagliflozin-Metformin HCl ER (SYNJARDY XR) 12.08-998 MG TB24 Take 1 tablet by mouth every morning. 90 tablet 3  . furosemide (LASIX) 20 MG tablet Take 1 tablet (20 mg total) by mouth daily. Dx: E11.8. 90 tablet 3  . glimepiride (AMARYL) 1 MG tablet Take 1 tablet (1 mg total) by mouth 2 (two) times daily. 180 tablet 3  . glucose blood (ACCU-CHEK AVIVA) test strip 1 each by Other route 3 (three) times daily. Dx: E11.8 100 each 12  . HYDROcodone-acetaminophen (NORCO/VICODIN) 5-325 MG tablet Take 1 tablet by mouth every 6 (six) hours as needed for moderate pain. 120 tablet 0  . Lancets MISC Use as directed to test blood sugar up to three times a day, as directed by physician. E11.09 300 each 1    . levothyroxine (SYNTHROID, LEVOTHROID) 75 MCG tablet TAKE 1 TABLET EVERY DAY 90 tablet 3  . LORazepam (ATIVAN) 0.5 MG tablet Take 1 tablet (0.5 mg total) by mouth 2 (two) times daily. 180 tablet 1  . losartan (COZAAR) 100 MG tablet TAKE 1 TABLET EVERY DAY 90 tablet 3  . methylPREDNISolone (MEDROL DOSEPAK) 4 MG TBPK tablet As directed 21 tablet 0  . Multiple Vitamins-Minerals (OCUVITE PRESERVISION) TABS Take 1 tablet by mouth 2 (two) times daily.    [name] omeprazole (PRILOSEC) 40 MG capsule TAKE 1 CAPSULE EVERY DAY 90 capsule 3  . temazepam (RESTORIL) 15 MG capsule TAKE 1 CAPSULE AT BEDTIME AS NEEDED FOR SLEEP 90 capsule 1  . triamcinolone (KENALOG) 0.1 % paste Use as directed 1 application in the mouth or throat 3 (three) times daily. Gum sore 5 g 1  . triamcinolone ointment (KENALOG) 0.5 % Apply 1 application topically 3 (three) times daily. APPLY TO AFFECTED AREA(S) 90 g 2  . triamterene-hydrochlorothiazide (MAXZIDE-25) 37.5-25 MG tablet TAKE 1 TABLET EVERY DAY 90 tablet 3  . venlafaxine XR (EFFEXOR-XR) 75 MG 24 hr capsule TAKE 1 CAPSULE EVERY DAY WITH BREAKFAST 90 capsule 2   No facility-administered medications prior to visit.     ROS Review of Systems  Constitutional: Negative for  activity change, appetite change, chills, fatigue and unexpected weight change.  HENT: Negative for congestion, mouth sores and sinus pressure.   Eyes: Negative for visual disturbance.  Respiratory: Negative for cough and chest tightness.   Gastrointestinal: Negative for abdominal pain and nausea.  Genitourinary: Negative for difficulty urinating, frequency and vaginal pain.  Musculoskeletal: Positive for arthralgias, back pain and gait problem.  Skin: Positive for color change. Negative for pallor and rash.  Neurological: Negative for dizziness, tremors, weakness, numbness and headaches.  Psychiatric/Behavioral: Negative for confusion and sleep disturbance.    Objective:  BP 132/82 (BP Location: Left Arm,  Patient Position: Sitting, Cuff Size: Large)   Pulse 89   Temp 98.2 F (36.8 C) (Oral)   Ht _0  (1.575 m)   Wt 256 lb (116.1 kg)   SpO2 98%   BMI 46.82 kg/m   BP Readings from Last 3 Encounters:  12/08/16 132/82  11/30/16 138/80  11/10/16 122/74    Wt Readings from Last 3 Encounters:  12/08/16 256 lb (116.1 kg)  11/30/16 254 lb (115.2 kg)  11/10/16 253 lb (114.8 kg)    Physical Exam  Constitutional: She appears well-developed. No distress.  HENT:  Head: Normocephalic.  Right Ear: External ear normal.  Left Ear: External ear normal.  Nose: Nose normal.  Mouth/Throat: Oropharynx is clear and moist.  Eyes: Pupils are equal, round, and reactive to light. Conjunctivae are normal. Right eye exhibits no discharge. Left eye exhibits no discharge.  Neck: Normal range of motion. Neck supple. No JVD present. No tracheal deviation present. No thyromegaly present.  Cardiovascular: Normal rate, regular rhythm and normal heart sounds.   Pulmonary/Chest: No stridor. No respiratory distress. She has no wheezes.  Abdominal: Soft. Bowel sounds are normal. She exhibits no distension and no mass. There is no tenderness. There is no rebound and no guarding.  Musculoskeletal: She exhibits edema and tenderness.  Lymphadenopathy:    She has no cervical adenopathy.  Neurological: She displays normal reflexes. No cranial nerve deficit. She exhibits normal muscle tone. Coordination normal.  Skin: No rash noted. There is erythema.  Psychiatric: She has a normal mood and affect. Her behavior is normal. Judgment and thought content normal.  L anterior distal shin w/nodular erythema, swelling; no ulcers Obese  Lab Results  Component Value Date   WBC 8.8 09/16/2014   HGB 14.4 09/16/2014   HCT 43.1 09/16/2014   PLT 268 09/16/2014   GLUCOSE 117 (H) 10/09/2016   CHOL 210 (H) 10/10/2012   TRIG 289 (H) 10/10/2012   HDL 39 (L) 10/10/2012   LDLDIRECT 155.9 09/30/2012   LDLCALC 113 (H) 10/10/2012    ALT 25 09/16/2014   AST 32 09/16/2014   NA 137 10/09/2016   K 4.3 10/09/2016   CL 101 10/09/2016   CREATININE 1.02 10/09/2016   BUN 18 10/09/2016   CO2 29 10/09/2016   TSH 3.592 03/03/2015   INR 3.3 08/26/2009   HGBA1C 7.5 (H) 10/09/2016   MICROALBUR 4.5 (H) 12/02/2009    No results found.  Assessment & Plan:   There are no diagnoses linked to this encounter. I am having Jessica [name] maintain her Cholecalciferol, OCUVITE PRESERVISION, b complex vitamins, blood glucose meter kit and supplies, Lancets, glimepiride, glucose blood, apixaban, allopurinol, triamterene-hydrochlorothiazide, losartan, omeprazole, levothyroxine, venlafaxine XR, HYDROcodone-acetaminophen, Empagliflozin-Metformin HCl ER, amoxicillin, temazepam, LORazepam, triamcinolone, furosemide, triamcinolone ointment, doxycycline, methylPREDNISolone, VASCULERA, and clindamycin.  No orders of the defined types were placed in this encounter.    Follow-up: No Follow-up on  file.  Walker Kehr, MD

## 2016-12-08 NOTE — Assessment & Plan Note (Signed)
[facility] ref

## 2016-12-08 NOTE — Telephone Encounter (Signed)
noted 

## 2016-12-12 ENCOUNTER — Other Ambulatory Visit: Payer: Self-pay

## 2016-12-12 DIAGNOSIS — I872 Venous insufficiency (chronic) (peripheral): Secondary | ICD-10-CM

## 2016-12-18 NOTE — Progress Notes (Signed)
Pre visit review using our clinic review tool, if applicable. No additional management support is needed unless otherwise documented below in the visit note. 
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
## 2016-12-19 ENCOUNTER — Ambulatory Visit: Payer: Medicare HMO | Admitting: Internal Medicine

## 2016-12-20 ENCOUNTER — Ambulatory Visit (INDEPENDENT_AMBULATORY_CARE_PROVIDER_SITE_OTHER): Payer: Medicare HMO | Admitting: *Deleted

## 2016-12-20 VITALS — BP 142/78 | HR 72 | Resp 20 | Ht 62.0 in | Wt 251.0 lb

## 2016-12-20 DIAGNOSIS — Z0000 Encounter for general adult medical examination without abnormal findings: Secondary | ICD-10-CM

## 2016-12-20 DIAGNOSIS — Z23 Encounter for immunization: Secondary | ICD-10-CM

## 2016-12-20 NOTE — Patient Instructions (Signed)
Continue doing brain stimulating activities (puzzles, reading, adult coloring books, staying active) to keep memory sharp.   Continue to eat heart healthy diet (full of fruits, vegetables, whole grains, lean protein, water--limit salt, fat, and sugar intake) and increase physical activity as tolerated.   Ms. Jessica [name] , Thank you for taking time to come for your Medicare Wellness Visit. I appreciate your ongoing commitment to your health goals. Please review the following plan we discussed and let me know if I can assist you in the future.   These are the goals we discussed: Goals    . Be as active and as independent as possible          Enjoy life, family and worship God    . Exercise 3x per week (30 min per time)          Agrees to go to go to the [facility] and start water aerobic tiw Agrees to watch carbs; last A1c 6.8  Will monitor portions    . paient           Stay joyful and healthy       This is a list of the screening recommended for you and due dates:  Health Maintenance  Topic Date Due  . Eye exam for diabetics  09/17/1945  . DEXA scan (bone density measurement)  09/17/2000  . Complete foot exam   11/11/2013  . Flu Shot  07/02/2017*  . Tetanus Vaccine  10/13/2024  . Pneumonia vaccines  Completed  *Topic was postponed. The date shown is not the original due date.    Carbohydrate Counting for Diabetes Mellitus, Adult Carbohydrate counting is a method for keeping track of how many carbohydrates you eat. Eating carbohydrates naturally increases the amount of sugar (glucose) in the blood. Counting how many carbohydrates you eat helps keep your blood glucose within normal limits, which helps you manage your diabetes (diabetes mellitus). It is important to know how many carbohydrates you can safely have in each meal. This is different for every person. A diet and nutrition specialist (registered dietitian) can help you make a meal plan and calculate how many carbohydrates you  should have at each meal and snack. Carbohydrates are found in the following foods:  Grains, such as breads and cereals.  Dried beans and soy products.  Starchy vegetables, such as potatoes, peas, and corn.  Fruit and fruit juices.  Milk and yogurt.  Sweets and snack foods, such as cake, cookies, candy, chips, and soft drinks.  How do I count carbohydrates? There are two ways to count carbohydrates in food. You can use either of the methods or a combination of both. Reading "Nutrition Facts" on packaged food The "Nutrition Facts" list is included on the labels of almost all packaged foods and beverages in the [place] It includes:  The serving size.  Information about nutrients in each serving, including the grams (g) of carbohydrate per serving.  To use the "Nutrition Facts":  Decide how many servings you will have.  Multiply the number of servings by the number of carbohydrates per serving.  The resulting number is the total amount of carbohydrates that you will be having.  Learning standard serving sizes of other foods When you eat foods containing carbohydrates that are not packaged or do not include "Nutrition Facts" on the label, you need to measure the servings in order to count the amount of carbohydrates:  Measure the foods that you will eat with a food scale or measuring cup,  if needed.  Decide how many standard-size servings you will eat.  Multiply the number of servings by 15. Most carbohydrate-rich foods have about 15 g of carbohydrates per serving. ? For example, if you eat 8 oz (170 g) of strawberries, you will have eaten 2 servings and 30 g of carbohydrates (2 servings x 15 g = 30 g).  For foods that have more than one food mixed, such as soups and casseroles, you must count the carbohydrates in each food that is included.  The following list contains standard serving sizes of common carbohydrate-rich foods. Each of these servings has about 15 g of  carbohydrates:   hamburger bun or  English muffin.   oz (15 mL) syrup.   oz (14 g) jelly.  1 slice of bread.  1 six-inch tortilla.  3 oz (85 g) cooked rice or pasta.  4 oz (113 g) cooked dried beans.  4 oz (113 g) starchy vegetable, such as peas, corn, or potatoes.  4 oz (113 g) hot cereal.  4 oz (113 g) mashed potatoes or  of a large baked potato.  4 oz (113 g) canned or frozen fruit.  4 oz (120 mL) fruit juice.  4-6 crackers.  6 chicken nuggets.  6 oz (170 g) unsweetened dry cereal.  6 oz (170 g) plain fat-free yogurt or yogurt sweetened with artificial sweeteners.  8 oz (240 mL) milk.  8 oz (170 g) fresh fruit or one small piece of fruit.  24 oz (680 g) popped popcorn.  Example of carbohydrate counting Sample meal  3 oz (85 g) chicken breast.  6 oz (170 g) brown rice.  4 oz (113 g) corn.  8 oz (240 mL) milk.  8 oz (170 g) strawberries with sugar-free whipped topping. Carbohydrate calculation 1. Identify the foods that contain carbohydrates: ? Rice. ? Corn. ? Milk. ? Strawberries. 2. Calculate how many servings you have of each food: ? 2 servings rice. ? 1 serving corn. ? 1 serving milk. ? 1 serving strawberries. 3. Multiply each number of servings by 15 g: ? 2 servings rice x 15 g = 30 g. ? 1 serving corn x 15 g = 15 g. ? 1 serving milk x 15 g = 15 g. ? 1 serving strawberries x 15 g = 15 g. 4. Add together all of the amounts to find the total grams of carbohydrates eaten: ? 30 g + 15 g + 15 g + 15 g = 75 g of carbohydrates total. This information is not intended to replace advice given to you by your health care provider. Make sure you discuss any questions you have with your health care provider. Document Released: 03/20/2005 Document Revised: 10/08/2015 Document Reviewed: 09/01/2015 Elsevier Interactive Patient Education  2018 [employer]. Diabetes Mellitus and Food It is important for you to manage your blood sugar (glucose)  level. Your blood glucose level can be greatly affected by what you eat. Eating healthier foods in the appropriate amounts throughout the day at about the same time each day will help you control your blood glucose level. It can also help slow or prevent worsening of your diabetes mellitus. Healthy eating may even help you improve the level of your blood pressure and reach or maintain a healthy weight. General recommendations for healthful eating and cooking habits include:  Eating meals and snacks regularly. Avoid going long periods of time without eating to lose weight.  Eating a diet that consists mainly of plant-based foods, such as fruits,  vegetables, nuts, legumes, and whole grains.  Using low-heat cooking methods, such as baking, instead of high-heat cooking methods, such as deep frying.  Work with your dietitian to make sure you understand how to use the Nutrition Facts information on food labels. How can food affect me? Carbohydrates Carbohydrates affect your blood glucose level more than any other type of food. Your dietitian will help you determine how many carbohydrates to eat at each meal and teach you how to count carbohydrates. Counting carbohydrates is important to keep your blood glucose at a healthy level, especially if you are using insulin or taking certain medicines for diabetes mellitus. Alcohol Alcohol can cause sudden decreases in blood glucose (hypoglycemia), especially if you use insulin or take certain medicines for diabetes mellitus. Hypoglycemia can be a life-threatening condition. Symptoms of hypoglycemia (sleepiness, dizziness, and disorientation) are similar to symptoms of having too much alcohol. If your health care provider has given you approval to drink alcohol, do so in moderation and use the following guidelines:  Women should not have more than one drink per day, and men should not have more than two drinks per day. One drink is equal to: ? 12 oz of beer. ? 5  oz of Arlyce [place]. ? 1 oz of hard liquor.  Do not drink on an empty stomach.  Keep yourself hydrated. Have water, diet soda, or unsweetened iced tea.  Regular soda, juice, and other mixers might contain a lot of carbohydrates and should be counted.  What foods are not recommended? As you make food choices, it is important to remember that all foods are not the same. Some foods have fewer nutrients per serving than other foods, even though they might have the same number of calories or carbohydrates. It is difficult to get your body what it needs when you eat foods with fewer nutrients. Examples of foods that you should avoid that are high in calories and carbohydrates but low in nutrients include:  Trans fats (most processed foods list trans fats on the Nutrition Facts label).  Regular soda.  Juice.  Candy.  Sweets, such as cake, pie, doughnuts, and cookies.  Fried foods.  What foods can I eat? Eat nutrient-rich foods, which will nourish your body and keep you healthy. The food you should eat also will depend on several factors, including:  The calories you need.  The medicines you take.  Your weight.  Your blood glucose level.  Your blood pressure level.  Your cholesterol level.  You should eat a variety of foods, including:  Protein. ? Lean cuts of meat. ? Proteins low in saturated fats, such as fish, egg whites, and beans. Avoid processed meats.  Fruits and vegetables. ? Fruits and vegetables that may help control blood glucose levels, such as apples, mangoes, and yams.  Dairy products. ? Choose fat-free or low-fat dairy products, such as milk, yogurt, and cheese.  Grains, bread, pasta, and rice. ? Choose whole grain products, such as multigrain bread, whole oats, and brown rice. These foods may help control blood pressure.  Fats. ? Foods containing healthful fats, such as nuts, avocado, olive oil, canola oil, and fish.  Does everyone with diabetes mellitus have  the same meal plan? Because every person with diabetes mellitus is different, there is not one meal plan that works for everyone. It is very important that you meet with a dietitian who will help you create a meal plan that is just right for you. This information is not intended to replace  advice given to you by your health care provider. Make sure you discuss any questions you have with your health care provider. Document Released: 12/15/2004 Document Revised: 08/26/2015 Document Reviewed: 02/14/2013 Elsevier Interactive Patient Education  2017 [place]. Influenza Virus Vaccine injection What is this medicine? INFLUENZA VIRUS VACCINE (in floo EN zuh VAHY ruhs vak SEEN) helps to reduce the risk of getting influenza also known as the flu. The vaccine only helps protect you against some strains of the flu. This medicine may be used for other purposes; ask your health care provider or pharmacist if you have questions. COMMON BRAND NAME(S): Afluria, Agriflu, Alfuria, FLUAD, Fluarix, Fluarix Quadrivalent, Flublok, Flublok Quadrivalent, FLUCELVAX, Flulaval, Fluvirin, Fluzone, Fluzone High-Dose, Fluzone Intradermal What should I tell my health care provider before I take this medicine? They need to know if you have any of these conditions: -bleeding disorder like hemophilia -fever or infection -Guillain-Barre syndrome or other neurological problems -immune system problems -infection with the human immunodeficiency virus (HIV) or AIDS -low blood platelet counts -multiple sclerosis -an unusual or allergic reaction to influenza virus vaccine, latex, other medicines, foods, dyes, or preservatives. Different brands of vaccines contain different allergens. Some may contain latex or eggs. Talk to your doctor about your allergies to make sure that you get the right vaccine. -pregnant or trying to get pregnant -breast-feeding How should I use this medicine? This vaccine is for injection into a muscle or  under the skin. It is given by a health care professional. A copy of Vaccine Information Statements will be given before each vaccination. Read this sheet carefully each time. The sheet may change frequently. Talk to your healthcare provider to see which vaccines are right for you. Some vaccines should not be used in all age groups. Overdosage: If you think you have taken too much of this medicine contact a poison control center or emergency room at once. NOTE: This medicine is only for you. Do not share this medicine with others. What if I miss a dose? This does not apply. What may interact with this medicine? -chemotherapy or radiation therapy -medicines that lower your immune system like etanercept, anakinra, infliximab, and adalimumab -medicines that treat or prevent blood clots like warfarin -phenytoin -steroid medicines like prednisone or cortisone -theophylline -vaccines This list may not describe all possible interactions. Give your health care provider a list of all the medicines, herbs, non-prescription drugs, or dietary supplements you use. Also tell them if you smoke, drink alcohol, or use illegal drugs. Some items may interact with your medicine. What should I watch for while using this medicine? Report any side effects that do not go away within 3 days to your doctor or health care professional. Call your health care provider if any unusual symptoms occur within 6 weeks of receiving this vaccine. You may still catch the flu, but the illness is not usually as bad. You cannot get the flu from the vaccine. The vaccine will not protect against colds or other illnesses that may cause fever. The vaccine is needed every year. What side effects may I notice from receiving this medicine? Side effects that you should report to your doctor or health care professional as soon as possible: -allergic reactions like skin rash, itching or hives, swelling of the face, lips, or tongue Side effects  that usually do not require medical attention (report to your doctor or health care professional if they continue or are bothersome): -fever -headache -muscle aches and pains -pain, tenderness, redness, or swelling at  the injection site -tiredness This list may not describe all possible side effects. Call your doctor for medical advice about side effects. You may report side effects to FDA at 1-800-FDA-1088. Where should I keep my medicine? The vaccine will be given by a health care professional in a clinic, pharmacy, doctor's office, or other health care setting. You will not be given vaccine doses to store at home. NOTE: This sheet is a summary. It may not cover all possible information. If you have questions about this medicine, talk to your doctor, pharmacist, or health care provider.  2018 Elsevier/Gold Standard (2014-10-09 10:07:28)

## 2016-12-27 ENCOUNTER — Ambulatory Visit (INDEPENDENT_AMBULATORY_CARE_PROVIDER_SITE_OTHER): Payer: Medicare HMO | Admitting: *Deleted

## 2016-12-27 ENCOUNTER — Telehealth: Payer: Self-pay | Admitting: Cardiology

## 2016-12-27 DIAGNOSIS — I441 Atrioventricular block, second degree: Secondary | ICD-10-CM

## 2016-12-27 NOTE — Telephone Encounter (Signed)
Spoke with pt and reminded pt of remote transmission that is due today. Pt verbalized understanding.   

## 2016-12-27 NOTE — Progress Notes (Signed)
Remote pacemaker transmission.   

## 2016-12-28 ENCOUNTER — Encounter: Payer: Self-pay | Admitting: Cardiology

## 2016-12-28 LAB — CUP PACEART REMOTE DEVICE CHECK
Battery Impedance: 231 Ohm
Battery Remaining Longevity: 115 mo
Battery Voltage: 2.79 V
Brady Statistic AP VP Percent: 1 %
Brady Statistic AS VP Percent: 98 %
Implantable Lead Implant Date: 20140710
Implantable Lead Model: 5092
Implantable Lead Model: 5592
Implantable Pulse Generator Implant Date: 20140710
Lead Channel Impedance Value: 474 Ohm
Lead Channel Pacing Threshold Amplitude: 0.375 V
Lead Channel Pacing Threshold Amplitude: 0.625 V
Lead Channel Setting Pacing Amplitude: 2 V
MDC IDC LEAD IMPLANT DT: 20140710
MDC IDC LEAD LOCATION: 753859
MDC IDC LEAD LOCATION: 753860
MDC IDC MSMT LEADCHNL RA PACING THRESHOLD PULSEWIDTH: 0.4 ms
MDC IDC MSMT LEADCHNL RV IMPEDANCE VALUE: 763 Ohm
MDC IDC MSMT LEADCHNL RV PACING THRESHOLD PULSEWIDTH: 0.4 ms
MDC IDC SESS DTM: 20180926170046
MDC IDC SET LEADCHNL RV PACING AMPLITUDE: 2.5 V
MDC IDC SET LEADCHNL RV PACING PULSEWIDTH: 0.4 ms
MDC IDC SET LEADCHNL RV SENSING SENSITIVITY: 4 mV
MDC IDC STAT BRADY AP VS PERCENT: 0 %
MDC IDC STAT BRADY AS VS PERCENT: 1 %

## 2017-01-01 ENCOUNTER — Telehealth: Payer: Self-pay | Admitting: Internal Medicine
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
## 2017-01-08 ENCOUNTER — Ambulatory Visit (INDEPENDENT_AMBULATORY_CARE_PROVIDER_SITE_OTHER): Payer: Medicare HMO | Admitting: Internal Medicine

## 2017-01-08 ENCOUNTER — Encounter: Payer: Self-pay | Admitting: Internal Medicine

## 2017-01-08 DIAGNOSIS — L819 Disorder of pigmentation, unspecified: Secondary | ICD-10-CM | POA: Insufficient documentation

## 2017-01-08 DIAGNOSIS — E118 Type 2 diabetes mellitus with unspecified complications: Secondary | ICD-10-CM | POA: Diagnosis not present

## 2017-01-08 DIAGNOSIS — I872 Venous insufficiency (chronic) (peripheral): Secondary | ICD-10-CM

## 2017-01-08 DIAGNOSIS — L52 Erythema nodosum: Secondary | ICD-10-CM | POA: Diagnosis not present

## 2017-01-08 MED ORDER — GABAPENTIN 100 MG PO CAPS: 100.0000 mg | ORAL_CAPSULE | Freq: Three times a day (TID) | ORAL | 3 refills | Status: DC | PRN

## 2017-01-08 NOTE — Assessment & Plan Note (Addendum)
Re-start Metformin D/c Synjardy XR and Amaryl due to R foot discoloration

## 2017-01-08 NOTE — Assessment & Plan Note (Addendum)
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 No   On Eliquis, Vasculera  Arterial Doppler US; D/c Synjardy XR and Amaryl due to R foot discoloration

## 2017-01-08 NOTE — Assessment & Plan Note (Signed)
Notes recently worsening blurry vision, L>R. He is overdue for eye exam - requests return to [facility] - referral placed.  

## 2017-01-08 NOTE — Progress Notes (Signed)
Subjective:  Patient ID: Jessica [name], female    DOB: [birth year]  Age: 83 y.o. MRN: [id number]  CC: No chief complaint on file.   HPI Jessica [name] presents for severe LLE pain in the shin area - not better C/o new R foot discoloration w/o pain  x3-4 weeks. Norco not helping On Eliquis, Vasculera  Outpatient Medications Prior to Visit  Medication Sig Dispense Refill  . allopurinol (ZYLOPRIM) 100 MG tablet TAKE 1 TABLET EVERY DAY 90 tablet 3  . apixaban (ELIQUIS) 5 MG TABS tablet Take 1 tablet (5 mg total) by mouth 2 (two) times daily. 180 tablet 1  . b complex vitamins tablet Take 1 tablet by mouth daily.    . blood glucose meter kit and supplies KIT Use to test blood sugar up to three times a day, as directed by physician Dx:E11.09 1 each 0  . Cholecalciferol 1000 UNITS tablet Take 1,000 Units by mouth daily.      . Dietary Management Product (VASCULERA) TABS Take 1 tablet by mouth daily. 90 tablet 3  . Empagliflozin-Metformin HCl ER (SYNJARDY XR) 12.08-998 MG TB24 Take 1 tablet by mouth every morning. 90 tablet 3  . furosemide (LASIX) 20 MG tablet Take 1 tablet (20 mg total) by mouth daily. Dx: E11.8. 90 tablet 3  . glimepiride (AMARYL) 1 MG tablet Take 1 tablet (1 mg total) by mouth 2 (two) times daily. 180 tablet 3  . glucose blood (ACCU-CHEK AVIVA) test strip 1 each by Other route 3 (three) times daily. Dx: E11.8 100 each 12  . Lancets MISC Use as directed to test blood sugar up to three times a day, as directed by physician. E11.09 300 each 1  . levothyroxine (SYNTHROID, LEVOTHROID) 75 MCG tablet TAKE 1 TABLET EVERY DAY 90 tablet 3  . LORazepam (ATIVAN) 0.5 MG tablet Take 1 tablet (0.5 mg total) by mouth 2 (two) times daily. 180 tablet 1  . losartan (COZAAR) 100 MG tablet TAKE 1 TABLET EVERY DAY 90 tablet 3  . Multiple Vitamins-Minerals (OCUVITE PRESERVISION) TABS Take 1 tablet by mouth 2 (two) times daily.    [name] omeprazole (PRILOSEC) 40 MG capsule TAKE 1 CAPSULE EVERY  DAY 90 capsule 3  . temazepam (RESTORIL) 15 MG capsule TAKE 1 CAPSULE AT BEDTIME AS NEEDED FOR SLEEP 90 capsule 1  . triamcinolone (KENALOG) 0.1 % paste Use as directed 1 application in the mouth or throat 3 (three) times daily. Gum sore 5 g 1  . triamcinolone ointment (KENALOG) 0.5 % Apply 1 application topically 3 (three) times daily. APPLY TO AFFECTED AREA(S) 90 g 2  . triamterene-hydrochlorothiazide (MAXZIDE-25) 37.5-25 MG tablet TAKE 1 TABLET EVERY DAY 90 tablet 3  . venlafaxine XR (EFFEXOR-XR) 75 MG 24 hr capsule TAKE 1 CAPSULE EVERY DAY WITH BREAKFAST 90 capsule 2   No facility-administered medications prior to visit.     ROS Review of Systems  Constitutional: Positive for unexpected weight change. Negative for activity change, appetite change, chills and fatigue.  HENT: Negative for congestion, mouth sores and sinus pressure.   Eyes: Negative for visual disturbance.  Respiratory: Negative for cough and chest tightness.   Gastrointestinal: Negative for abdominal pain and nausea.  Genitourinary: Negative for difficulty urinating, frequency and vaginal pain.  Musculoskeletal: Positive for arthralgias. Negative for back pain and gait problem.  Skin: Negative for pallor and rash.  Neurological: Negative for dizziness, tremors, weakness, numbness and headaches.  Psychiatric/Behavioral: Positive for dysphoric mood. Negative for confusion, sleep disturbance and  suicidal ideas. The patient is nervous/anxious.     Objective:  BP 126/74 (BP Location: Left Arm, Patient Position: Sitting, Cuff Size: Large)   Pulse 90   Temp 98.1 F (36.7 C) (Oral)   Ht '5\' 2"'  (1.575 m)   Wt 255 lb (115.7 kg)   SpO2 98%   BMI 46.64 kg/m   BP Readings from Last 3 Encounters:  01/08/17 126/74  12/20/16 (!) 142/78  12/08/16 132/82    Wt Readings from Last 3 Encounters:  01/08/17 255 lb (115.7 kg)  12/20/16 251 lb (113.9 kg)  12/08/16 256 lb (116.1 kg)    Physical Exam  Constitutional: She  appears well-developed. No distress.  HENT:  Head: Normocephalic.  Right Ear: External ear normal.  Left Ear: External ear normal.  Nose: Nose normal.  Mouth/Throat: Oropharynx is clear and moist.  Eyes: Pupils are equal, round, and reactive to light. Conjunctivae are normal. Right eye exhibits no discharge. Left eye exhibits no discharge.  Neck: Normal range of motion. Neck supple. No JVD present. No tracheal deviation present. No thyromegaly present.  Cardiovascular: Normal rate, regular rhythm and normal heart sounds.   Pulmonary/Chest: No stridor. No respiratory distress. She has no wheezes.  Abdominal: Soft. Bowel sounds are normal. She exhibits no distension and no mass. There is no tenderness. There is no rebound and no guarding.  Musculoskeletal: She exhibits tenderness. She exhibits no edema.  Lymphadenopathy:    She has no cervical adenopathy.  Neurological: She displays normal reflexes. No cranial nerve deficit. She exhibits normal muscle tone. Coordination normal.  Skin: No rash noted. There is erythema.  Psychiatric: She has a normal mood and affect. Her behavior is normal. Judgment and thought content normal.  L shin and calf - indurated skin, erythema; warm; L foot - WNL R foot w/Livedo reticularis discoloration; warm Obese  Lab Results  Component Value Date   WBC 8.5 12/08/2016   HGB 15.3 (H) 12/08/2016   HCT 46.3 (H) 12/08/2016   PLT 369.0 12/08/2016   GLUCOSE 102 (H) 12/08/2016   CHOL 210 (H) 10/10/2012   TRIG 289 (H) 10/10/2012   HDL 39 (L) 10/10/2012   LDLDIRECT 155.9 09/30/2012   LDLCALC 113 (H) 10/10/2012   ALT 22 12/08/2016   AST 22 12/08/2016   NA 139 12/08/2016   K 3.9 12/08/2016   CL 99 12/08/2016   CREATININE 0.90 12/08/2016   BUN 18 12/08/2016   CO2 30 12/08/2016   TSH 3.99 12/08/2016   INR 3.3 08/26/2009   HGBA1C 7.5 (H) 10/09/2016   MICROALBUR 4.5 (H) 12/02/2009    No results found.  Assessment & Plan:   There are no diagnoses linked  to this encounter. I am having Ms. Mcbane maintain her Cholecalciferol, OCUVITE PRESERVISION, b complex vitamins, blood glucose meter kit and supplies, Lancets, glimepiride, glucose blood, allopurinol, triamterene-hydrochlorothiazide, losartan, omeprazole, levothyroxine, venlafaxine XR, Empagliflozin-Metformin HCl ER, temazepam, LORazepam, triamcinolone, furosemide, triamcinolone ointment, VASCULERA, and apixaban.  No orders of the defined types were placed in this encounter.    Follow-up: No Follow-up on file.  Walker Kehr, MD

## 2017-01-08 NOTE — Assessment & Plan Note (Signed)
Try Gabapentin for pain

## 2017-01-08 NOTE — Assessment & Plan Note (Signed)
Refractory Discussed diet

## 2017-01-08 NOTE — Addendum Note (Signed)
Addended by: Karren Cobble on: 01/08/2017 12:07 PM   Modules accepted: Orders

## 2017-01-08 NOTE — Assessment & Plan Note (Signed)
Worse On Eliquis, Vasculera Vein Clinc 10/17

## 2017-01-11 ENCOUNTER — Ambulatory Visit (HOSPITAL_COMMUNITY)
Admission: RE | Admit: 2017-01-11 | Discharge: 2017-01-11 | Disposition: A | Discharge status: Home / Self Care | Payer: Medicare HMO | Source: Ambulatory Visit | Attending: Vascular Surgery | Admitting: Vascular Surgery

## 2017-01-11 DIAGNOSIS — L819 Disorder of pigmentation, unspecified: Secondary | ICD-10-CM | POA: Diagnosis not present

## 2017-01-11 DIAGNOSIS — I872 Venous insufficiency (chronic) (peripheral): Secondary | ICD-10-CM

## 2017-01-15 ENCOUNTER — Other Ambulatory Visit: Payer: Self-pay | Admitting: Internal Medicine

## 2017-01-15 ENCOUNTER — Telehealth: Payer: Self-pay | Admitting: Internal Medicine

## 2017-01-15 NOTE — Telephone Encounter (Signed)
Called [employer] to check on rx from 08/03/16. Per rep pt does have 1 refill remaining that could be process, but the Lorazepam is currently on back order. Pt can have fill at her local pharmacy if she would like. Inform rep.. [facility]am on pharmacy vm, also called pt no answer LMOM w/status...Jessica [name]

## 2017-01-15 NOTE — Telephone Encounter (Signed)
Check [place] registry last filled 08/25/2016 90 day w/[employer]. MD is out of the office this week pls advise on refill.../lm,b

## 2017-01-15 NOTE — Telephone Encounter (Signed)
Is requesting a small script of [facility]azep[facility] patient.  Is also requesting active script for this med to mail order.

## 2017-01-15 NOTE — Telephone Encounter (Signed)
Should not need refill. Last given 3 month with refill in May.

## 2017-01-17 ENCOUNTER — Ambulatory Visit (INDEPENDENT_AMBULATORY_CARE_PROVIDER_SITE_OTHER): Payer: Medicare HMO | Admitting: Surgery

## 2017-01-17 ENCOUNTER — Ambulatory Visit (HOSPITAL_COMMUNITY)
Admission: RE | Admit: 2017-01-17 | Discharge: 2017-01-17 | Disposition: A | Discharge status: Home / Self Care | Payer: Medicare HMO | Source: Ambulatory Visit | Attending: Surgery | Admitting: Surgery

## 2017-01-17 ENCOUNTER — Encounter: Payer: Self-pay | Admitting: Surgery

## 2017-01-17 VITALS — BP 111/78 | HR 71 | Temp 97.5°F | Resp 20 | Ht 62.0 in | Wt 260.0 lb

## 2017-01-17 DIAGNOSIS — I8393 Asymptomatic varicose veins of bilateral lower extremities: Secondary | ICD-10-CM | POA: Diagnosis not present

## 2017-01-17 DIAGNOSIS — R609 Edema, unspecified: Secondary | ICD-10-CM | POA: Diagnosis present

## 2017-01-17 DIAGNOSIS — I872 Venous insufficiency (chronic) (peripheral): Secondary | ICD-10-CM

## 2017-01-17 MED ORDER — SULFAMETHOXAZOLE-TRIMETHOPRIM 400-80 MG PO TABS: 1.0000 | ORAL_TABLET | Freq: Two times a day (BID) | ORAL | 0 refills | Status: DC
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
## 2017-01-18 ENCOUNTER — Ambulatory Visit (INDEPENDENT_AMBULATORY_CARE_PROVIDER_SITE_OTHER): Payer: Medicare HMO | Admitting: Family Medicine

## 2017-01-18 VITALS — BP 138/74 | HR 76 | Temp 97.9°F | Ht 62.0 in | Wt 262.0 lb

## 2017-01-18 DIAGNOSIS — L52 Erythema nodosum: Secondary | ICD-10-CM | POA: Diagnosis not present

## 2017-01-18 DIAGNOSIS — L03116 Cellulitis of left lower limb: Secondary | ICD-10-CM

## 2017-01-18 MED ORDER — COLCHICINE 0.6 MG PO TABS: 0.6000 mg | ORAL_TABLET | Freq: Two times a day (BID) | ORAL | 2 refills | Status: DC

## 2017-01-18 MED ORDER — HYDROCODONE-ACETAMINOPHEN 5-325 MG PO TABS: 1.0000 | ORAL_TABLET | Freq: Three times a day (TID) | ORAL | 0 refills | Status: DC | PRN

## 2017-01-18 NOTE — Assessment & Plan Note (Signed)
Has not had any improvement or resolution of her pain with prior treatments. - Try colchicine to her ongoing treatment as described previously. - Could consider a punch biopsy. - Could consider referral to dermatology if no resolution.

## 2017-01-18 NOTE — Patient Instructions (Signed)
Thank you for coming in,   Please take the Bactrim with a probiotic. Please follow-up with the lymphedema clinic.  I have started colchicine which can cause diarrhea. If you develop diarrhea then decrease the dose.  Please let us know if her symptoms seem to worsen or fail to improve.   Please feel free to call with any questions or concerns at any time, at [phone]. --Dr. Raeford Razor

## 2017-01-18 NOTE — Assessment & Plan Note (Addendum)
Has not had any improvement with the antibiotics, steroids, or other treatment to this point. Has seen the vascular specialist who is referring her to the lymphedema clinic and giving her 2 week prescription of Bactrim. Possible this is gout she has a history of gout. Has also been suspected of erythema nodosum. - I will try colchicine to try to treat as a gouty attack and may have some effect if erythema nodosum. - Advised to take the Bactrim and keep her referral to the lymphedema clinic - Provided Vicodin for pain and continue gabapentin. Could consider increasing the gabapentin if there is concern for complex regional pain syndrome. Could consider a bone scan to evaluate further for this. - Could consider an x-ray or an MRI if there is any concern for osteomyelitis.
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
## 2017-01-22 ENCOUNTER — Ambulatory Visit: Payer: Medicare HMO | Admitting: Internal Medicine

## 2017-01-24 ENCOUNTER — Telehealth: Payer: Self-pay | Admitting: Family Medicine

## 2017-01-24 MED ORDER — HYDROCODONE-ACETAMINOPHEN 5-325 MG PO TABS: 1.0000 | ORAL_TABLET | Freq: Three times a day (TID) | ORAL | 0 refills | Status: DC | PRN

## 2017-01-24 NOTE — Telephone Encounter (Signed)
HYDROcodone-acetaminophen (NORCO/VICODIN) 5-325 MG tablet   Patient is requesting a refill on this medication. She states it has helped her leg pain. She also states even thought the RX stated 1 at a time. You informed her to take 2 at a time, and that is how she has took the medication. Please advise.

## 2017-01-24 NOTE — Telephone Encounter (Signed)
Patient notified, she will pick up RX today.

## 2017-01-24 NOTE — Telephone Encounter (Signed)
Refilled norco #10.   Rosemarie Ax, MD [facility] 01/24/2017, 12:12 PM

## 2017-01-31 ENCOUNTER — Telehealth: Payer: Self-pay | Admitting: Internal Medicine

## 2017-01-31 ENCOUNTER — Ambulatory Visit (INDEPENDENT_AMBULATORY_CARE_PROVIDER_SITE_OTHER): Payer: Medicare HMO | Admitting: Internal Medicine

## 2017-01-31 ENCOUNTER — Encounter: Payer: Self-pay | Admitting: Internal Medicine

## 2017-01-31 VITALS — BP 130/84 | HR 82 | Ht 62.0 in | Wt 259.0 lb

## 2017-01-31 DIAGNOSIS — I480 Paroxysmal atrial fibrillation: Secondary | ICD-10-CM | POA: Diagnosis not present

## 2017-01-31 DIAGNOSIS — Z950 Presence of cardiac pacemaker: Secondary | ICD-10-CM | POA: Diagnosis not present

## 2017-01-31 DIAGNOSIS — I441 Atrioventricular block, second degree: Secondary | ICD-10-CM

## 2017-01-31 DIAGNOSIS — I10 Essential (primary) hypertension: Secondary | ICD-10-CM

## 2017-01-31 DIAGNOSIS — I890 Lymphedema, not elsewhere classified: Secondary | ICD-10-CM
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
## 2017-01-31 NOTE — Telephone Encounter (Signed)
Patient called stating that she was referred for Physical Therapy in [place] but wanted to go somewhere in [place]. She said that she spoke with someone and they were going to get the referral resent to somewhere closer to her. Do you know anything about this?

## 2017-01-31 NOTE — Patient Instructions (Signed)
Medication Instructions:  Your physician recommends that you continue on your current medications as directed. Please refer to the Current Medication list given to you today.  -- If you need a refill on your cardiac medications before your next appointment, please call your pharmacy. --  Labwork: None ordered  Testing/Procedures: None ordered  Follow-Up: Your physician wants you to follow-up in: 1 year with Chanetta Marshall NP.  You will receive a reminder letter in the mail two months in advance. If you don't receive a letter, please call our office to schedule the follow-up appointment.  Remote monitoring is used to monitor your Pacemaker from home. This monitoring reduces the number of office visits required to check your device to one time per year. It allows [place] to keep an eye on the functioning of your device to ensure it is working properly. You are scheduled for a device check from home on 03/29/2017. You may send your transmission at any time that day. If you have a wireless device, the transmission will be sent automatically. After your physician reviews your transmission, you will receive a postcard with your next transmission date.    Thank you for choosing [facility]!!   Frederik Schmidt, RN [phone]  Any Other Special Instructions Will Be Listed Below (If Applicable).

## 2017-02-02 NOTE — Telephone Encounter (Signed)
No OK to ref to [facility] PT Thx

## 2017-02-02 NOTE — Telephone Encounter (Signed)
Referral entered  

## 2017-02-06 DIAGNOSIS — I890 Lymphedema, not elsewhere classified: Secondary | ICD-10-CM | POA: Diagnosis not present

## 2017-02-07 ENCOUNTER — Ambulatory Visit (HOSPITAL_COMMUNITY): Payer: Medicare HMO | Attending: Cardiovascular Disease

## 2017-02-07 ENCOUNTER — Other Ambulatory Visit: Payer: Self-pay

## 2017-02-07 VITALS — BP 119/57

## 2017-02-07 DIAGNOSIS — I10 Essential (primary) hypertension: Secondary | ICD-10-CM | POA: Insufficient documentation

## 2017-02-07 DIAGNOSIS — I517 Cardiomegaly: Secondary | ICD-10-CM | POA: Diagnosis not present

## 2017-02-07 DIAGNOSIS — I4891 Unspecified atrial fibrillation: Secondary | ICD-10-CM | POA: Diagnosis not present

## 2017-02-07 DIAGNOSIS — I082 Rheumatic disorders of both aortic and tricuspid valves: Secondary | ICD-10-CM | POA: Diagnosis not present

## 2017-02-07 DIAGNOSIS — I350 Nonrheumatic aortic (valve) stenosis: Secondary | ICD-10-CM

## 2017-02-07 MED ORDER — PERFLUTREN LIPID MICROSPHERE
1.0000 mL | INTRAVENOUS | Status: AC | PRN
  Administered 2017-02-07: 2 mL via INTRAVENOUS

## 2017-02-08 LAB — CUP PACEART INCLINIC DEVICE CHECK
Battery Impedance: 231 Ohm
Battery Remaining Longevity: 114 mo
Battery Voltage: 2.79 V
Brady Statistic AP VP Percent: 1 %
Brady Statistic AP VS Percent: 0 %
Brady Statistic AS VP Percent: 98 %
Date Time Interrogation Session: 20181031175242
Implantable Lead Implant Date: 20140710
Implantable Lead Location: 753860
Implantable Lead Model: 5092
Implantable Pulse Generator Implant Date: 20140710
Lead Channel Impedance Value: 503 Ohm
Lead Channel Impedance Value: 706 Ohm
Lead Channel Pacing Threshold Amplitude: 0.75 V
Lead Channel Pacing Threshold Pulse Width: 0.4 ms
Lead Channel Setting Pacing Amplitude: 2.5 V
MDC IDC LEAD IMPLANT DT: 20140710
MDC IDC LEAD LOCATION: 753859
MDC IDC MSMT LEADCHNL RA SENSING INTR AMPL: 1.4 mV
MDC IDC MSMT LEADCHNL RV PACING THRESHOLD AMPLITUDE: 0.5 V
MDC IDC MSMT LEADCHNL RV PACING THRESHOLD PULSEWIDTH: 0.4 ms
MDC IDC MSMT LEADCHNL RV SENSING INTR AMPL: 11.2 mV
MDC IDC SET LEADCHNL RA PACING AMPLITUDE: 2 V
MDC IDC SET LEADCHNL RV PACING PULSEWIDTH: 0.4 ms
MDC IDC SET LEADCHNL RV SENSING SENSITIVITY: 4 mV
MDC IDC STAT BRADY AS VS PERCENT: 1 %

## 2017-02-10 ENCOUNTER — Other Ambulatory Visit: Payer: Self-pay | Admitting: Internal Medicine

## 2017-02-14 ENCOUNTER — Ambulatory Visit (INDEPENDENT_AMBULATORY_CARE_PROVIDER_SITE_OTHER): Payer: Medicare HMO | Admitting: Cardiovascular Disease

## 2017-02-14 ENCOUNTER — Encounter: Payer: Self-pay | Admitting: Cardiovascular Disease

## 2017-02-14 VITALS — BP 143/83 | HR 91 | Ht 62.0 in | Wt 249.4 lb

## 2017-02-14 DIAGNOSIS — I5042 Chronic combined systolic (congestive) and diastolic (congestive) heart failure: Secondary | ICD-10-CM

## 2017-02-14 DIAGNOSIS — I10 Essential (primary) hypertension: Secondary | ICD-10-CM | POA: Diagnosis not present

## 2017-02-14 DIAGNOSIS — R0602 Shortness of breath: Secondary | ICD-10-CM | POA: Diagnosis not present

## 2017-02-14 DIAGNOSIS — I480 Paroxysmal atrial fibrillation: Secondary | ICD-10-CM | POA: Diagnosis not present
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
## 2017-02-14 NOTE — Patient Instructions (Signed)
Medication Instructions:  Your physician recommends that you continue on your current medications as directed. Please refer to the Current Medication list given to you today.  Labwork: Fasting lipids soon  Testing/Procedures: none  Follow-Up: Your physician wants you to follow-up in: 1 year ov  You will receive a reminder letter in the mail two months in advance. If you don't receive a letter, please call our office to schedule the follow-up appointment.  If you need a refill on your cardiac medications before your next appointment, please call your pharmacy.

## 2017-02-15 ENCOUNTER — Encounter: Payer: Self-pay | Admitting: Cardiovascular Disease

## 2017-02-16 ENCOUNTER — Other Ambulatory Visit (INDEPENDENT_AMBULATORY_CARE_PROVIDER_SITE_OTHER): Payer: Medicare HMO

## 2017-02-16 ENCOUNTER — Other Ambulatory Visit: Payer: Self-pay

## 2017-02-16 ENCOUNTER — Telehealth: Payer: Self-pay | Admitting: Internal Medicine

## 2017-02-16 DIAGNOSIS — I10 Essential (primary) hypertension: Secondary | ICD-10-CM

## 2017-02-16 DIAGNOSIS — E118 Type 2 diabetes mellitus with unspecified complications: Secondary | ICD-10-CM | POA: Diagnosis not present

## 2017-02-16 LAB — BASIC METABOLIC PANEL
BUN: 22 mg/dL (ref 6–23)
CHLORIDE: 105 meq/L (ref 96–112)
CO2: 26 mEq/L (ref 19–32)
CREATININE: 0.81 mg/dL (ref 0.40–1.20)
Calcium: 9.6 mg/dL (ref 8.4–10.5)
GFR: 72.05 mL/min (ref >60.00)
Glucose, Bld: 151 mg/dL — ABNORMAL HIGH (ref 70–99)
POTASSIUM: 4 meq/L (ref 3.5–5.1)
Sodium: 141 mEq/L (ref 135–145)

## 2017-02-16 LAB — HEMOGLOBIN A1C: HEMOGLOBIN A1C: 7 % — AB (ref 4.6–6.5)

## 2017-02-16 MED ORDER — ACCU-CHEK SOFT TOUCH LANCETS MISC: 1.0000 | Freq: Three times a day (TID) | 1 refills | Status: DC

## 2017-02-16 NOTE — Telephone Encounter (Signed)
Pharmacy called and needs a refill of the Lancets MISC  Would like accu check soft clock lancets  Please advise and send into [facility]

## 2017-02-16 NOTE — Telephone Encounter (Signed)
Reviewed chart pt is up-to-date sent refills to [facility]  

## 2017-02-19 ENCOUNTER — Ambulatory Visit: Payer: Medicare HMO | Admitting: Internal Medicine

## 2017-02-19 ENCOUNTER — Encounter: Payer: Self-pay | Admitting: Internal Medicine

## 2017-02-19 VITALS — BP 136/72 | HR 80 | Temp 97.9°F | Ht 62.0 in | Wt 251.0 lb

## 2017-02-19 DIAGNOSIS — G8929 Other chronic pain: Secondary | ICD-10-CM | POA: Diagnosis not present

## 2017-02-19 DIAGNOSIS — L03116 Cellulitis of left lower limb: Secondary | ICD-10-CM | POA: Diagnosis not present

## 2017-02-19 DIAGNOSIS — I480 Paroxysmal atrial fibrillation: Secondary | ICD-10-CM | POA: Diagnosis not present

## 2017-02-19 DIAGNOSIS — E118 Type 2 diabetes mellitus with unspecified complications: Secondary | ICD-10-CM

## 2017-02-19 DIAGNOSIS — I872 Venous insufficiency (chronic) (peripheral): Secondary | ICD-10-CM

## 2017-02-19 DIAGNOSIS — I4892 Unspecified atrial flutter: Secondary | ICD-10-CM

## 2017-02-19 DIAGNOSIS — M545 Low back pain: Secondary | ICD-10-CM

## 2017-02-19 DIAGNOSIS — R21 Rash and other nonspecific skin eruption: Secondary | ICD-10-CM | POA: Diagnosis not present

## 2017-02-19 LAB — LIPID PANEL
CHOL/HDL RATIO: 5
Cholesterol: 191 mg/dL (ref 0–200)
HDL: 38.9 mg/dL — ABNORMAL LOW (ref >39.00)
LDL CALC: 116 mg/dL — AB (ref 0–99)
NONHDL: 152.4
Triglycerides: 183 mg/dL — ABNORMAL HIGH (ref 0.0–149.0)
VLDL: 36.6 mg/dL (ref 0.0–40.0)

## 2017-02-19 MED ORDER — HYDROCODONE-ACETAMINOPHEN 5-325 MG PO TABS: 1.0000 | ORAL_TABLET | Freq: Three times a day (TID) | ORAL | 0 refills | Status: DC | PRN

## 2017-02-19 NOTE — Progress Notes (Signed)
Subjective:  Patient ID: Jessica [name], female    DOB: 1933/08/21  Age: 83 y.o. MRN: [id number]  CC: No chief complaint on file.   HPI LUDMILA EBARB presents for L LE pain and swelling w/redness - not better. Arterial and venous dopplers are (-). F/u A fib, gout, HTN  Outpatient Medications Prior to Visit  Medication Sig Dispense Refill  . allopurinol (ZYLOPRIM) 100 MG tablet TAKE 1 TABLET EVERY DAY 90 tablet 3  . apixaban (ELIQUIS) 5 MG TABS tablet Take 1 tablet (5 mg total) by mouth 2 (two) times daily. 180 tablet 1  . b complex vitamins tablet Take 1 tablet by mouth daily.    . blood glucose meter kit and supplies KIT Use to test blood sugar up to three times a day, as directed by physician Dx:E11.09 1 each 0  . Cholecalciferol 1000 UNITS tablet Take 1,000 Units by mouth daily.      . colchicine 0.6 MG tablet Take 1 tablet (0.6 mg total) by mouth 2 (two) times daily. Take until symptom free for 48 hours. 60 tablet 2  . Dietary Management Product (VASCULERA) TABS Take 1 tablet by mouth daily. 90 tablet 3  . furosemide (LASIX) 20 MG tablet Take 1 tablet (20 mg total) by mouth daily. Dx: E11.8. 90 tablet 3  . gabapentin (NEURONTIN) 100 MG capsule Take 1-2 capsules (100-200 mg total) by mouth 3 (three) times daily as needed. 100 capsule 3  . glimepiride (AMARYL) 1 MG tablet TAKE 1 TABLET TWICE DAILY 180 tablet 3  . glucose blood (ACCU-CHEK AVIVA) test strip 1 each by Other route 3 (three) times daily. Dx: E11.8 100 each 12  . HYDROcodone-acetaminophen (NORCO/VICODIN) 5-325 MG tablet Take 1 tablet by mouth every 8 (eight) hours as needed for moderate pain. 10 tablet 0  . Lancets (ACCU-CHEK SOFT TOUCH) lancets 1 each 3 (three) times daily by Other route. Use to help check blood sugars three times A DAY 300 each 1  . levothyroxine (SYNTHROID, LEVOTHROID) 75 MCG tablet TAKE 1 TABLET EVERY DAY 90 tablet 3  . LORazepam (ATIVAN) 0.5 MG tablet Take 1 tablet (0.5 mg total) by mouth 2  (two) times daily. 180 tablet 1  . losartan (COZAAR) 100 MG tablet TAKE 1 TABLET EVERY DAY 90 tablet 3  . Multiple Vitamins-Minerals (OCUVITE PRESERVISION) TABS Take 1 tablet by mouth 2 (two) times daily.    [name] omeprazole (PRILOSEC) 40 MG capsule TAKE 1 CAPSULE EVERY DAY 90 capsule 3  . temazepam (RESTORIL) 15 MG capsule TAKE 1 CAPSULE AT BEDTIME AS NEEDED FOR SLEEP 90 capsule 1  . triamcinolone ointment (KENALOG) 0.5 % Apply 1 application topically 3 (three) times daily. APPLY TO AFFECTED AREA(S) 90 g 2  . triamterene-hydrochlorothiazide (MAXZIDE-25) 37.5-25 MG tablet TAKE 1 TABLET EVERY DAY 90 tablet 3  . venlafaxine XR (EFFEXOR-XR) 75 MG 24 hr capsule TAKE 1 CAPSULE EVERY DAY WITH BREAKFAST 90 capsule 2   No facility-administered medications prior to visit.     ROS Review of Systems  Constitutional: Negative for activity change, appetite change, chills, fatigue and unexpected weight change.  HENT: Negative for congestion, mouth sores and sinus pressure.   Eyes: Negative for visual disturbance.  Respiratory: Negative for cough and chest tightness.   Gastrointestinal: Negative for abdominal pain and nausea.  Genitourinary: Negative for difficulty urinating, frequency and vaginal pain.  Musculoskeletal: Positive for back pain and gait problem.  Skin: Positive for color change and rash. Negative for pallor and wound.  Neurological: Negative for dizziness, tremors, weakness, numbness and headaches.  Psychiatric/Behavioral: Negative for confusion, sleep disturbance and suicidal ideas.    Objective:  BP 136/72 (BP Location: Left Arm, Patient Position: Sitting, Cuff Size: Large)   Pulse 80   Temp 97.9 F (36.6 C) (Oral)   Ht _0  (1.575 m)   Wt 251 lb (113.9 kg)   SpO2 98%   BMI 45.91 kg/m   BP Readings from Last 3 Encounters:  02/19/17 136/72  02/14/17 (!) 143/83  02/07/17 (!) 119/57    Wt Readings from Last 3 Encounters:  02/19/17 251 lb (113.9 kg)  02/14/17 249 lb 6.4 oz  (113.1 kg)  01/31/17 259 lb (117.5 kg)    Physical Exam  Constitutional: She appears well-developed. No distress.  HENT:  Head: Normocephalic.  Right Ear: External ear normal.  Left Ear: External ear normal.  Nose: Nose normal.  Mouth/Throat: Oropharynx is clear and moist.  Eyes: Conjunctivae are normal. Pupils are equal, round, and reactive to light. Right eye exhibits no discharge. Left eye exhibits no discharge.  Neck: Normal range of motion. Neck supple. No JVD present. No tracheal deviation present. No thyromegaly present.  Cardiovascular: Normal rate, regular rhythm and normal heart sounds.  Pulmonary/Chest: No stridor. No respiratory distress. She has no wheezes.  Abdominal: Soft. Bowel sounds are normal. She exhibits no distension and no mass. There is no tenderness. There is no rebound and no guarding.  Musculoskeletal: She exhibits edema and tenderness.  Lymphadenopathy:    She has no cervical adenopathy.  Neurological: She displays normal reflexes. No cranial nerve deficit. She exhibits normal muscle tone. Coordination normal.  Skin: No rash noted. There is erythema.  Psychiatric: She has a normal mood and affect. Her behavior is normal. Judgment and thought content normal.  distal L shin w/purple erythema and induration/knots - tender Obese   Lab Results  Component Value Date   WBC 8.5 12/08/2016   HGB 15.3 (H) 12/08/2016   HCT 46.3 (H) 12/08/2016   PLT 369.0 12/08/2016   GLUCOSE 151 (H) 02/16/2017   CHOL 210 (H) 10/10/2012   TRIG 289 (H) 10/10/2012   HDL 39 (L) 10/10/2012   LDLDIRECT 155.9 09/30/2012   LDLCALC 113 (H) 10/10/2012   ALT 22 12/08/2016   AST 22 12/08/2016   NA 141 02/16/2017   K 4.0 02/16/2017   CL 105 02/16/2017   CREATININE 0.81 02/16/2017   BUN 22 02/16/2017   CO2 26 02/16/2017   TSH 3.99 12/08/2016   INR 3.3 08/26/2009   HGBA1C 7.0 (H) 02/16/2017   MICROALBUR 4.5 (H) 12/02/2009    No results found.  Assessment & Plan:   There are  no diagnoses linked to this encounter. I am having Jessica [name] maintain her Cholecalciferol, OCUVITE PRESERVISION, b complex vitamins, blood glucose meter kit and supplies, glucose blood, triamterene-hydrochlorothiazide, losartan, omeprazole, levothyroxine, venlafaxine XR, temazepam, LORazepam, furosemide, triamcinolone ointment, VASCULERA, apixaban, gabapentin, allopurinol, colchicine, HYDROcodone-acetaminophen, glimepiride, and accu-chek soft touch.  No orders of the defined types were placed in this encounter.    Follow-up: No Follow-up on file.  Walker Kehr, MD

## 2017-02-19 NOTE — Assessment & Plan Note (Signed)
On Meds Loose wt

## 2017-02-19 NOTE — Assessment & Plan Note (Signed)
On Eliquis

## 2017-02-19 NOTE — Assessment & Plan Note (Addendum)
Not better. [facility] ref - Dr Trula Slade ?lymphedema 11/18 Derm ref

## 2017-02-19 NOTE — Assessment & Plan Note (Signed)
LLE - secondary to edema/chronic lymphedema/venous stasis/obesity Derm ref

## 2017-02-19 NOTE — Assessment & Plan Note (Signed)
Worse. Discussed again

## 2017-02-19 NOTE — Assessment & Plan Note (Signed)
Norco prn  Potential benefits of a long term opioids use as well as potential risks (i.e. addiction risk, apnea etc) and complications (i.e. Somnolence, constipation and others) were explained to the patient and were aknowledged. Norco from a different manufacturer caused nausea. C/o LBP: Tramadol is not working Reduce Norco dose to 5/325 - 1/18

## 2017-02-26 DIAGNOSIS — I890 Lymphedema, not elsewhere classified: Secondary | ICD-10-CM | POA: Diagnosis not present

## 2017-02-28 DIAGNOSIS — I890 Lymphedema, not elsewhere classified: Secondary | ICD-10-CM | POA: Diagnosis not present

## 2017-03-02 DIAGNOSIS — I890 Lymphedema, not elsewhere classified: Secondary | ICD-10-CM | POA: Diagnosis not present

## 2017-03-05 ENCOUNTER — Telehealth: Payer: Self-pay | Admitting: Internal Medicine

## 2017-03-05 MED ORDER — TRUE METRIX AIR GLUCOSE METER W/DEVICE KIT: 1.0000 | PACK | Freq: Two times a day (BID) | 0 refills | Status: DC

## 2017-03-05 MED ORDER — DUO-CARE CONTROL SOLUTION VI LIQD: 0 refills | Status: DC

## 2017-03-05 MED ORDER — TRUEPLUS LANCETS 30G MISC: 1 refills | Status: DC

## 2017-03-05 MED ORDER — GLUCOSE BLOOD VI STRP: 1.0000 | ORAL_STRIP | Freq: Two times a day (BID) | 1 refills | Status: DC

## 2017-03-05 NOTE — Telephone Encounter (Signed)
[facility] is calling to request a change in patient's meter and supplies:  [employer] Test strips- True metric Lancets- True plus 33g Control solution level one  They would like these prescribed for patient for better coverage

## 2017-03-05 NOTE — Telephone Encounter (Signed)
Route to CMA °

## 2017-03-05 NOTE — Telephone Encounter (Signed)
Reviewed chart pt is up-to-date sent True metric monitor w/supplies to [facility]...Johny Chess

## 2017-03-12 ENCOUNTER — Ambulatory Visit: Payer: Self-pay

## 2017-03-12 NOTE — Telephone Encounter (Signed)
Appointment made for pt.Instructed if pain or redness gets worse, or has shortness of breath today to go to ED.Verbalizes understanding.

## 2017-03-12 NOTE — Telephone Encounter (Signed)
   Reason for Disposition . [1] MILD swelling of both ankles (i.e., pedal edema) AND [2] new onset or worsening  Answer Assessment - Initial Assessment Questions 1. ONSET: "When did the swelling start?" (e.g., minutes, hours, days)     Started 1 week ago but has gotten worse 2. LOCATION: "What part of the leg is swollen?"  "Are both legs swollen or just one leg?"     Back of left leg at the calf, lower calf 3. SEVERITY: "How bad is the swelling?" (e.g., localized; mild, moderate, severe)  - Localized - small area of swelling localized to one leg  - MILD pedal edema - swelling limited to foot and ankle, pitting edema < 1/4 inch (6 mm) deep, rest and elevation eliminate most or all swelling  - MODERATE edema - swelling of lower leg to knee, pitting edema > 1/4 inch (6 mm) deep, rest and elevation only partially reduce swelling  - SEVERE edema - swelling extends above knee, facial or hand swelling present      Soreness is a 7 4. REDNESS: "Does the swelling look red or infected?"     Red 5. PAIN: "Is the swelling painful to touch?" If so, ask: "How painful is it?"   (Scale 1-10; mild, moderate or severe)     Hurts to touch 6. FEVER: "Do you have a fever?" If so, ask: "What is it, how was it measured, and when did it start?"      No 7. CAUSE: "What do you think is causing the leg swelling?"     Had swelling in the front leg for weeks and has seen the doctor. 8. MEDICAL HISTORY: "Do you have a history of heart failure, kidney disease, liver failure, or cancer?"     Pacemaker 9. RECURRENT SYMPTOM: "Have you had leg swelling before?" If so, ask: "When was the last time?" "What happened that time?"     Yes 10. OTHER SYMPTOMS: "Do you have any other symptoms?" (e.g., chest pain, difficulty breathing)       No breathing difficulty, Takes Eliquis 11. PREGNANCY: "Is there any chance you are pregnant?" "When was your last menstrual period?"       No  Protocols used: LEG SWELLING AND  EDEMA-A-AH  Pt. States "I've had a blood clot before and this doesn't feel like that

## 2017-03-13 ENCOUNTER — Ambulatory Visit: Payer: Medicare HMO | Admitting: Internal Medicine

## 2017-03-15 ENCOUNTER — Ambulatory Visit: Payer: Medicare HMO | Admitting: Internal Medicine

## 2017-03-17 ENCOUNTER — Ambulatory Visit: Payer: Medicare HMO | Admitting: Internal Medicine

## 2017-03-19 ENCOUNTER — Other Ambulatory Visit: Payer: Self-pay | Admitting: Internal Medicine

## 2017-03-29 ENCOUNTER — Telehealth: Payer: Self-pay | Admitting: Cardiology

## 2017-03-29 ENCOUNTER — Ambulatory Visit (INDEPENDENT_AMBULATORY_CARE_PROVIDER_SITE_OTHER): Payer: Medicare HMO | Admitting: *Deleted

## 2017-03-29 DIAGNOSIS — I441 Atrioventricular block, second degree: Secondary | ICD-10-CM

## 2017-03-29 NOTE — Telephone Encounter (Signed)
LMOVM reminding pt to send remote transmission.   

## 2017-03-30 ENCOUNTER — Encounter: Payer: Self-pay | Admitting: Cardiology

## 2017-03-30 NOTE — Progress Notes (Signed)
Remote pacemaker transmission.   

## 2017-03-31 LAB — CUP PACEART REMOTE DEVICE CHECK
Brady Statistic AP VS Percent: 0 %
Brady Statistic AS VP Percent: 91 %
Date Time Interrogation Session: 20181227215758
Implantable Lead Implant Date: 20140710
Implantable Lead Location: 753859
Implantable Lead Model: 5592
Lead Channel Pacing Threshold Amplitude: 0.375 V
Lead Channel Pacing Threshold Amplitude: 0.625 V
Lead Channel Pacing Threshold Pulse Width: 0.4 ms
MDC IDC LEAD IMPLANT DT: 20140710
MDC IDC LEAD LOCATION: 753860
MDC IDC MSMT BATTERY IMPEDANCE: 255 Ohm
MDC IDC MSMT BATTERY REMAINING LONGEVITY: 110 mo
MDC IDC MSMT BATTERY VOLTAGE: 2.79 V
MDC IDC MSMT LEADCHNL RA IMPEDANCE VALUE: 495 Ohm
MDC IDC MSMT LEADCHNL RA SENSING INTR AMPL: 0.7 mV
MDC IDC MSMT LEADCHNL RV IMPEDANCE VALUE: 783 Ohm
MDC IDC MSMT LEADCHNL RV PACING THRESHOLD PULSEWIDTH: 0.4 ms
MDC IDC PG IMPLANT DT: 20140710
MDC IDC SET LEADCHNL RA PACING AMPLITUDE: 2 V
MDC IDC SET LEADCHNL RV PACING AMPLITUDE: 2.5 V
MDC IDC SET LEADCHNL RV PACING PULSEWIDTH: 0.4 ms
MDC IDC SET LEADCHNL RV SENSING SENSITIVITY: 4 mV
MDC IDC STAT BRADY AP VP PERCENT: 2 %
MDC IDC STAT BRADY AS VS PERCENT: 7 %

## 2017-04-02 ENCOUNTER — Ambulatory Visit: Payer: Medicare HMO | Admitting: Internal Medicine

## 2017-04-02 ENCOUNTER — Encounter: Payer: Self-pay | Admitting: Internal Medicine

## 2017-04-02 DIAGNOSIS — F411 Generalized anxiety disorder: Secondary | ICD-10-CM | POA: Diagnosis not present

## 2017-04-02 DIAGNOSIS — I480 Paroxysmal atrial fibrillation: Secondary | ICD-10-CM | POA: Diagnosis not present

## 2017-04-02 DIAGNOSIS — L52 Erythema nodosum: Secondary | ICD-10-CM

## 2017-04-02 DIAGNOSIS — I10 Essential (primary) hypertension: Secondary | ICD-10-CM

## 2017-04-02 MED ORDER — LORAZEPAM 0.5 MG PO TABS
0.5000 mg | ORAL_TABLET | Freq: Two times a day (BID) | ORAL | 1 refills | Status: DC
Start: 2017-04-02 — End: 2017-07-11

## 2017-04-02 MED ORDER — METFORMIN HCL ER 750 MG PO TB24
750.0000 mg | ORAL_TABLET | Freq: Every day | ORAL | 3 refills | Status: DC
Start: 2017-04-02 — End: 2018-01-02

## 2017-04-02 MED ORDER — HYDROCODONE-ACETAMINOPHEN 5-325 MG PO TABS: 1.0000 | ORAL_TABLET | Freq: Three times a day (TID) | ORAL | 0 refills | Status: DC | PRN

## 2017-04-02 NOTE — Assessment & Plan Note (Signed)
Wt Readings from Last 3 Encounters:  04/02/17 256 lb (116.1 kg)  02/19/17 251 lb (113.9 kg)  02/14/17 249 lb 6.4 oz (113.1 kg)  discussed diet

## 2017-04-02 NOTE — Assessment & Plan Note (Signed)
Triamt/HCTZ, Losartan 

## 2017-04-02 NOTE — Progress Notes (Signed)
Subjective:  Patient ID: Jessica [name], female    DOB: November 06, 1933  Age: 82 y.o. MRN: [id number]  CC: No chief complaint on file.   HPI Jessica [name] presents for LLE lymphedema, pain - better (Dr Trula Slade) F/u obesity, DM, gout  Pt's twin sister has moved in Dtr had a leg amputated PVD  Outpatient Medications Prior to Visit  Medication Sig Dispense Refill  . allopurinol (ZYLOPRIM) 100 MG tablet TAKE 1 TABLET EVERY DAY 90 tablet 3  . apixaban (ELIQUIS) 5 MG TABS tablet Take 1 tablet (5 mg total) by mouth 2 (two) times daily. 180 tablet 1  . b complex vitamins tablet Take 1 tablet by mouth daily.    . Blood Glucose Calibration (DUO-CARE CONTROL SOLUTION) LIQD [place] as directed 3 each 0  . blood glucose meter kit and supplies KIT Use to test blood sugar up to three times a day, as directed by physician Dx:E11.09 1 each 0  . Blood Glucose Monitoring Suppl (TRUE METRIX AIR GLUCOSE METER) w/Device KIT 1 Device by Does not apply route 2 (two) times daily. Use to check blood sugars twice a day 1 kit 0  . Cholecalciferol 1000 UNITS tablet Take 1,000 Units by mouth daily.      . colchicine 0.6 MG tablet Take 1 tablet (0.6 mg total) by mouth 2 (two) times daily. Take until symptom free for 48 hours. 60 tablet 2  . Dietary Management Product (VASCULERA) TABS Take 1 tablet by mouth daily. 90 tablet 3  . furosemide (LASIX) 20 MG tablet Take 1 tablet (20 mg total) by mouth daily. Dx: E11.8. 90 tablet 3  . gabapentin (NEURONTIN) 100 MG capsule Take 1-2 capsules (100-200 mg total) by mouth 3 (three) times daily as needed. 100 capsule 3  . glimepiride (AMARYL) 1 MG tablet TAKE 1 TABLET TWICE DAILY 180 tablet 3  . glucose blood (TRUE METRIX BLOOD GLUCOSE TEST) test strip 1 each by Other route 2 (two) times daily. Use to check blood sugars twice a day 300 each 1  . HYDROcodone-acetaminophen (NORCO/VICODIN) 5-325 MG tablet Take 1 tablet every 8 (eight) hours as needed by mouth for moderate pain. 100  tablet 0  . levothyroxine (SYNTHROID, LEVOTHROID) 75 MCG tablet TAKE 1 TABLET EVERY DAY 90 tablet 3  . LORazepam (ATIVAN) 0.5 MG tablet Take 1 tablet (0.5 mg total) by mouth 2 (two) times daily. 180 tablet 1  . losartan (COZAAR) 100 MG tablet TAKE 1 TABLET EVERY DAY 90 tablet 3  . metFORMIN (GLUCOPHAGE-XR) 750 MG 24 hr tablet Take 750 mg by mouth daily with breakfast.    . Multiple Vitamins-Minerals (OCUVITE PRESERVISION) TABS Take 1 tablet by mouth 2 (two) times daily.    [name] omeprazole (PRILOSEC) 40 MG capsule TAKE 1 CAPSULE EVERY DAY 90 capsule 3  . temazepam (RESTORIL) 15 MG capsule TAKE 1 CAPSULE AT BEDTIME AS NEEDED FOR SLEEP 90 capsule 1  . triamcinolone ointment (KENALOG) 0.5 % Apply 1 application topically 3 (three) times daily. APPLY TO AFFECTED AREA(S) 90 g 2  . triamterene-hydrochlorothiazide (MAXZIDE-25) 37.5-25 MG tablet TAKE 1 TABLET EVERY DAY 90 tablet 3  . TRUEPLUS LANCETS 30G MISC Use to help check blood sugars twice a day 300 each 1  . venlafaxine XR (EFFEXOR-XR) 75 MG 24 hr capsule TAKE 1 CAPSULE EVERY DAY WITH BREAKFAST 90 capsule 2   No facility-administered medications prior to visit.     ROS Review of Systems  Constitutional: Negative for activity change, appetite change, chills,  fatigue and unexpected weight change.  HENT: Negative for congestion, mouth sores and sinus pressure.   Eyes: Negative for visual disturbance.  Respiratory: Negative for cough and chest tightness.   Gastrointestinal: Negative for abdominal pain and nausea.  Genitourinary: Negative for difficulty urinating, frequency and vaginal pain.  Musculoskeletal: Positive for arthralgias, back pain and gait problem.  Skin: Positive for color change and rash. Negative for pallor.  Neurological: Negative for dizziness, tremors, weakness, numbness and headaches.  Psychiatric/Behavioral: Negative for confusion and sleep disturbance.    Objective:  BP 128/72 (BP Location: Left Arm, Patient Position:  Sitting, Cuff Size: Large)   Pulse 86   Temp 97.8 F (36.6 C) (Oral)   Ht '5\' 2"'$  (1.575 m)   Wt 256 lb (116.1 kg)   SpO2 99%   BMI 46.82 kg/m   BP Readings from Last 3 Encounters:  04/02/17 128/72  02/19/17 136/72  02/14/17 (!) 143/83    Wt Readings from Last 3 Encounters:  04/02/17 256 lb (116.1 kg)  02/19/17 251 lb (113.9 kg)  02/14/17 249 lb 6.4 oz (113.1 kg)    Physical Exam  Constitutional: She appears well-developed. No distress.  HENT:  Head: Normocephalic.  Right Ear: External ear normal.  Left Ear: External ear normal.  Nose: Nose normal.  Mouth/Throat: Oropharynx is clear and moist.  Eyes: Conjunctivae are normal. Pupils are equal, round, and reactive to light. Right eye exhibits no discharge. Left eye exhibits no discharge.  Neck: Normal range of motion. Neck supple. No JVD present. No tracheal deviation present. No thyromegaly present.  Cardiovascular: Normal rate, regular rhythm and normal heart sounds.  Pulmonary/Chest: No stridor. No respiratory distress. She has no wheezes.  Abdominal: Soft. Bowel sounds are normal. She exhibits no distension and no mass. There is no tenderness. There is no rebound and no guarding.  Musculoskeletal: She exhibits edema and tenderness.  Lymphadenopathy:    She has no cervical adenopathy.  Neurological: She displays normal reflexes. No cranial nerve deficit. She exhibits normal muscle tone. Coordination normal.  Skin: No rash noted. There is erythema.  Psychiatric: She has a normal mood and affect. Her behavior is normal. Judgment and thought content normal.   Indurated LLE distally w/eryth nodules - a little better   Lab Results  Component Value Date   WBC 8.5 12/08/2016   HGB 15.3 (H) 12/08/2016   HCT 46.3 (H) 12/08/2016   PLT 369.0 12/08/2016   GLUCOSE 151 (H) 02/16/2017   CHOL 191 02/19/2017   TRIG 183.0 (H) 02/19/2017   HDL 38.90 (L) 02/19/2017   LDLDIRECT 155.9 09/30/2012   LDLCALC 116 (H) 02/19/2017   ALT 22  12/08/2016   AST 22 12/08/2016   NA 141 02/16/2017   K 4.0 02/16/2017   CL 105 02/16/2017   CREATININE 0.81 02/16/2017   BUN 22 02/16/2017   CO2 26 02/16/2017   TSH 3.99 12/08/2016   INR 3.3 08/26/2009   HGBA1C 7.0 (H) 02/16/2017   MICROALBUR 4.5 (H) 12/02/2009    No results found.  Assessment & Plan:   There are no diagnoses linked to this encounter. I am having Jessica [name] maintain her Cholecalciferol, OCUVITE PRESERVISION, b complex vitamins, blood glucose meter kit and supplies, levothyroxine, temazepam, LORazepam, furosemide, triamcinolone ointment, VASCULERA, apixaban, gabapentin, allopurinol, colchicine, glimepiride, HYDROcodone-acetaminophen, TRUE METRIX AIR GLUCOSE METER, glucose blood, TRUEPLUS LANCETS 30G, DUO-CARE CONTROL SOLUTION, losartan, omeprazole, triamterene-hydrochlorothiazide, venlafaxine XR, and metFORMIN.  No orders of the defined types were placed in this encounter.  Follow-up: No Follow-up on file.  Walker Kehr, MD

## 2017-04-02 NOTE — Assessment & Plan Note (Addendum)
Temazepam prn  Potential benefits of a long term prn benzodiazepines  use as well as potential risks  and complications were explained to the patient and were aknowledged. Pt's sister has moved in Dtr had a leg amputated PVD

## 2017-04-02 NOTE — Assessment & Plan Note (Signed)
Eliquis 

## 2017-04-02 NOTE — Assessment & Plan Note (Signed)
Vein Clinic ref Dr Trula Slade: lymphedema dx Better

## 2017-04-18 ENCOUNTER — Encounter: Payer: Self-pay | Admitting: Internal Medicine

## 2017-04-18 ENCOUNTER — Ambulatory Visit: Payer: Self-pay | Admitting: *Deleted

## 2017-04-18 ENCOUNTER — Ambulatory Visit (INDEPENDENT_AMBULATORY_CARE_PROVIDER_SITE_OTHER): Payer: Medicare HMO | Admitting: Internal Medicine

## 2017-04-18 ENCOUNTER — Emergency Department (HOSPITAL_COMMUNITY): Payer: Medicare HMO

## 2017-04-18 ENCOUNTER — Other Ambulatory Visit: Payer: Self-pay

## 2017-04-18 ENCOUNTER — Inpatient Hospital Stay (HOSPITAL_COMMUNITY)
Admission: EM | Admit: 2017-04-18 | Discharge: 2017-04-20 | DRG: 308 | Disposition: A | Discharge status: Home / Self Care | Payer: Medicare HMO | Attending: Cardiology | Admitting: Cardiology

## 2017-04-18 ENCOUNTER — Encounter (HOSPITAL_COMMUNITY): Payer: Self-pay

## 2017-04-18 VITALS — BP 132/74 | HR 85 | Temp 97.9°F | Ht 62.0 in | Wt 259.0 lb

## 2017-04-18 DIAGNOSIS — Z79890 Hormone replacement therapy (postmenopausal): Secondary | ICD-10-CM

## 2017-04-18 DIAGNOSIS — Z7901 Long term (current) use of anticoagulants: Secondary | ICD-10-CM

## 2017-04-18 DIAGNOSIS — I110 Hypertensive heart disease with heart failure: Secondary | ICD-10-CM | POA: Diagnosis present

## 2017-04-18 DIAGNOSIS — F329 Major depressive disorder, single episode, unspecified: Secondary | ICD-10-CM | POA: Diagnosis present

## 2017-04-18 DIAGNOSIS — Z888 Allergy status to other drugs, medicaments and biological substances status: Secondary | ICD-10-CM

## 2017-04-18 DIAGNOSIS — R0602 Shortness of breath: Secondary | ICD-10-CM | POA: Diagnosis not present

## 2017-04-18 DIAGNOSIS — I5043 Acute on chronic combined systolic (congestive) and diastolic (congestive) heart failure: Secondary | ICD-10-CM | POA: Diagnosis not present

## 2017-04-18 DIAGNOSIS — R7989 Other specified abnormal findings of blood chemistry: Secondary | ICD-10-CM | POA: Diagnosis present

## 2017-04-18 DIAGNOSIS — E039 Hypothyroidism, unspecified: Secondary | ICD-10-CM | POA: Diagnosis present

## 2017-04-18 DIAGNOSIS — Z885 Allergy status to narcotic agent status: Secondary | ICD-10-CM

## 2017-04-18 DIAGNOSIS — R531 Weakness: Secondary | ICD-10-CM | POA: Diagnosis not present

## 2017-04-18 DIAGNOSIS — R0600 Dyspnea, unspecified: Secondary | ICD-10-CM | POA: Insufficient documentation

## 2017-04-18 DIAGNOSIS — Z8249 Family history of ischemic heart disease and other diseases of the circulatory system: Secondary | ICD-10-CM

## 2017-04-18 DIAGNOSIS — I480 Paroxysmal atrial fibrillation: Principal | ICD-10-CM | POA: Diagnosis present

## 2017-04-18 DIAGNOSIS — K219 Gastro-esophageal reflux disease without esophagitis: Secondary | ICD-10-CM | POA: Diagnosis not present

## 2017-04-18 DIAGNOSIS — I4891 Unspecified atrial fibrillation: Secondary | ICD-10-CM | POA: Diagnosis present

## 2017-04-18 DIAGNOSIS — R748 Abnormal levels of other serum enzymes: Secondary | ICD-10-CM | POA: Diagnosis present

## 2017-04-18 DIAGNOSIS — I4892 Unspecified atrial flutter: Secondary | ICD-10-CM

## 2017-04-18 DIAGNOSIS — Z86711 Personal history of pulmonary embolism: Secondary | ICD-10-CM

## 2017-04-18 DIAGNOSIS — Z79891 Long term (current) use of opiate analgesic: Secondary | ICD-10-CM

## 2017-04-18 DIAGNOSIS — Z9049 Acquired absence of other specified parts of digestive tract: Secondary | ICD-10-CM

## 2017-04-18 DIAGNOSIS — I248 Other forms of acute ischemic heart disease: Secondary | ICD-10-CM | POA: Diagnosis present

## 2017-04-18 DIAGNOSIS — Z807 Family history of other malignant neoplasms of lymphoid, hematopoietic and related tissues: Secondary | ICD-10-CM

## 2017-04-18 DIAGNOSIS — I441 Atrioventricular block, second degree: Secondary | ICD-10-CM | POA: Diagnosis not present

## 2017-04-18 DIAGNOSIS — H9209 Otalgia, unspecified ear: Secondary | ICD-10-CM

## 2017-04-18 DIAGNOSIS — L52 Erythema nodosum: Secondary | ICD-10-CM

## 2017-04-18 DIAGNOSIS — J329 Chronic sinusitis, unspecified: Secondary | ICD-10-CM | POA: Diagnosis present

## 2017-04-18 DIAGNOSIS — Z6841 Body Mass Index (BMI) 40.0 and over, adult: Secondary | ICD-10-CM

## 2017-04-18 DIAGNOSIS — E1142 Type 2 diabetes mellitus with diabetic polyneuropathy: Secondary | ICD-10-CM | POA: Diagnosis present

## 2017-04-18 DIAGNOSIS — R0609 Other forms of dyspnea: Secondary | ICD-10-CM

## 2017-04-18 DIAGNOSIS — E785 Hyperlipidemia, unspecified: Secondary | ICD-10-CM | POA: Diagnosis present

## 2017-04-18 DIAGNOSIS — Z950 Presence of cardiac pacemaker: Secondary | ICD-10-CM | POA: Diagnosis not present

## 2017-04-18 DIAGNOSIS — R52 Pain, unspecified: Secondary | ICD-10-CM | POA: Diagnosis not present

## 2017-04-18 DIAGNOSIS — R778 Other specified abnormalities of plasma proteins: Secondary | ICD-10-CM | POA: Diagnosis present

## 2017-04-18 DIAGNOSIS — I5023 Acute on chronic systolic (congestive) heart failure: Secondary | ICD-10-CM

## 2017-04-18 DIAGNOSIS — Z823 Family history of stroke: Secondary | ICD-10-CM | POA: Diagnosis not present

## 2017-04-18 DIAGNOSIS — R011 Cardiac murmur, unspecified: Secondary | ICD-10-CM | POA: Diagnosis present

## 2017-04-18 DIAGNOSIS — I350 Nonrheumatic aortic (valve) stenosis: Secondary | ICD-10-CM | POA: Diagnosis present

## 2017-04-18 DIAGNOSIS — I447 Left bundle-branch block, unspecified: Secondary | ICD-10-CM | POA: Diagnosis present

## 2017-04-18 DIAGNOSIS — Z79899 Other long term (current) drug therapy: Secondary | ICD-10-CM

## 2017-04-18 DIAGNOSIS — F419 Anxiety disorder, unspecified: Secondary | ICD-10-CM | POA: Diagnosis present

## 2017-04-18 DIAGNOSIS — Z86718 Personal history of other venous thrombosis and embolism: Secondary | ICD-10-CM | POA: Diagnosis not present

## 2017-04-18 DIAGNOSIS — R5383 Other fatigue: Secondary | ICD-10-CM | POA: Diagnosis not present

## 2017-04-18 DIAGNOSIS — Z7984 Long term (current) use of oral hypoglycemic drugs: Secondary | ICD-10-CM

## 2017-04-18 LAB — I-STAT CHEM 8, ED
BUN: 21 mg/dL — ABNORMAL HIGH (ref 6–20)
Calcium, Ion: 1.18 mmol/L (ref 1.15–1.40)
Chloride: 103 mmol/L (ref 101–111)
Creatinine, Ser: 0.8 mg/dL (ref 0.44–1.00)
Glucose, Bld: 187 mg/dL — ABNORMAL HIGH (ref 65–99)
HCT: 41 % (ref 36.0–46.0)
Hemoglobin: 13.9 g/dL (ref 12.0–15.0)
POTASSIUM: 3.9 mmol/L (ref 3.5–5.1)
SODIUM: 140 mmol/L (ref 135–145)
TCO2: 25 mmol/L (ref 22–32)

## 2017-04-18 LAB — CBC
HEMATOCRIT: 41.8 % (ref 36.0–46.0)
Hemoglobin: 13.9 g/dL (ref 12.0–15.0)
MCH: 30.8 pg (ref 26.0–34.0)
MCHC: 33.3 g/dL (ref 30.0–36.0)
MCV: 92.5 fL (ref 78.0–100.0)
Platelets: 299 10*3/uL (ref 150–400)
RBC: 4.52 MIL/uL (ref 3.87–5.11)
RDW: 14.2 % (ref 11.5–15.5)
WBC: 10.2 10*3/uL (ref 4.0–10.5)

## 2017-04-18 LAB — GLUCOSE, CAPILLARY
GLUCOSE-CAPILLARY: 118 mg/dL — AB (ref 65–99)
Glucose-Capillary: 216 mg/dL — ABNORMAL HIGH (ref 65–99)

## 2017-04-18 LAB — TROPONIN I
TROPONIN I: 0.22 ng/mL — AB (ref <0.03)
TROPONIN I: 0.22 ng/mL — AB (ref <0.03)
TROPONIN I: 0.19 ng/mL — AB (ref <0.03)

## 2017-04-18 LAB — BASIC METABOLIC PANEL
Anion gap: 11 (ref 5–15)
BUN: 19 mg/dL (ref 6–20)
CHLORIDE: 106 mmol/L (ref 101–111)
CO2: 22 mmol/L (ref 22–32)
Calcium: 9.4 mg/dL (ref 8.9–10.3)
Creatinine, Ser: 0.82 mg/dL (ref 0.44–1.00)
GFR calc Af Amer: >60 mL/min (ref >60)
GFR calc non Af Amer: >60 mL/min (ref >60)
Glucose, Bld: 189 mg/dL — ABNORMAL HIGH (ref 65–99)
POTASSIUM: 3.8 mmol/L (ref 3.5–5.1)
SODIUM: 139 mmol/L (ref 135–145)

## 2017-04-18 LAB — I-STAT TROPONIN, ED: Troponin i, poc: 0.25 ng/mL (ref 0.00–0.08)

## 2017-04-18 LAB — TSH: TSH: 2.706 u[IU]/mL (ref 0.350–4.500)

## 2017-04-18 LAB — MAGNESIUM: MAGNESIUM: 2 mg/dL (ref 1.7–2.4)

## 2017-04-18 LAB — BRAIN NATRIURETIC PEPTIDE: B Natriuretic Peptide: 211.3 pg/mL — ABNORMAL HIGH (ref 0.0–100.0)

## 2017-04-18 MED ORDER — GABAPENTIN 100 MG PO CAPS: 100.0000 mg | ORAL_CAPSULE | Freq: Three times a day (TID) | ORAL | Status: DC | PRN

## 2017-04-18 MED ORDER — AMIODARONE IV BOLUS ONLY 150 MG/100ML
150.0000 mg | Freq: Once | INTRAVENOUS | Status: AC
  Administered 2017-04-18: 150 mg via INTRAVENOUS

## 2017-04-18 MED ORDER — FUROSEMIDE 20 MG PO TABS
20.0000 mg | ORAL_TABLET | Freq: Every day | ORAL | Status: DC
  Administered 2017-04-19: 20 mg via ORAL
  Filled 2017-04-18: qty 1

## 2017-04-18 MED ORDER — ASPIRIN 81 MG PO CHEW
324.0000 mg | CHEWABLE_TABLET | Freq: Once | ORAL | Status: AC
  Administered 2017-04-18: 324 mg via ORAL
  Filled 2017-04-18: qty 4

## 2017-04-18 MED ORDER — APIXABAN 5 MG PO TABS
5.0000 mg | ORAL_TABLET | Freq: Two times a day (BID) | ORAL | Status: DC
  Administered 2017-04-18 – 2017-04-20 (×4): 5 mg via ORAL
  Filled 2017-04-18 (×4): qty 1

## 2017-04-18 MED ORDER — PANTOPRAZOLE SODIUM 40 MG PO TBEC: 40.0000 mg | DELAYED_RELEASE_TABLET | Freq: Every day | ORAL | Status: DC | PRN

## 2017-04-18 MED ORDER — LEVOTHYROXINE SODIUM 75 MCG PO TABS
75.0000 ug | ORAL_TABLET | Freq: Every day | ORAL | Status: DC
  Administered 2017-04-19 – 2017-04-20 (×2): 75 ug via ORAL
  Filled 2017-04-18 (×2): qty 1

## 2017-04-18 MED ORDER — SODIUM CHLORIDE 0.9 % IV BOLUS (SEPSIS)
1000.0000 mL | Freq: Once | INTRAVENOUS | Status: AC
  Administered 2017-04-18: 1000 mL via INTRAVENOUS

## 2017-04-18 MED ORDER — LOSARTAN POTASSIUM 50 MG PO TABS
100.0000 mg | ORAL_TABLET | Freq: Every day | ORAL | Status: DC
  Administered 2017-04-19 – 2017-04-20 (×2): 100 mg via ORAL
  Filled 2017-04-18 (×3): qty 2

## 2017-04-18 MED ORDER — APIXABAN 5 MG PO TABS: 5.0000 mg | ORAL_TABLET | Freq: Two times a day (BID) | ORAL | Status: DC

## 2017-04-18 MED ORDER — ALLOPURINOL 100 MG PO TABS
100.0000 mg | ORAL_TABLET | Freq: Every day | ORAL | Status: DC
  Administered 2017-04-19 – 2017-04-20 (×2): 100 mg via ORAL
  Filled 2017-04-18 (×2): qty 1

## 2017-04-18 MED ORDER — ACETAMINOPHEN 325 MG PO TABS: 650.0000 mg | ORAL_TABLET | ORAL | Status: DC | PRN

## 2017-04-18 MED ORDER — GLIMEPIRIDE 1 MG PO TABS
1.0000 mg | ORAL_TABLET | Freq: Two times a day (BID) | ORAL | Status: DC
  Administered 2017-04-18 – 2017-04-20 (×4): 1 mg via ORAL
  Filled 2017-04-18 (×4): qty 1

## 2017-04-18 MED ORDER — AMIODARONE LOAD VIA INFUSION
150.0000 mg | Freq: Once | INTRAVENOUS | Status: AC
  Administered 2017-04-18: 150 mg via INTRAVENOUS
  Filled 2017-04-18: qty 83.34

## 2017-04-18 MED ORDER — FENTANYL CITRATE (PF) 100 MCG/2ML IJ SOLN
100.0000 ug | Freq: Once | INTRAMUSCULAR | Status: DC
  Filled 2017-04-18: qty 2

## 2017-04-18 MED ORDER — TRIAMTERENE-HCTZ 37.5-25 MG PO TABS
1.0000 | ORAL_TABLET | Freq: Every day | ORAL | Status: DC
  Administered 2017-04-19 – 2017-04-20 (×2): 1 via ORAL
  Filled 2017-04-18 (×2): qty 1

## 2017-04-18 MED ORDER — AMIODARONE HCL IN DEXTROSE 360-4.14 MG/200ML-% IV SOLN
60.0000 mg/h | INTRAVENOUS | Status: AC
  Administered 2017-04-18 (×2): 60 mg/h via INTRAVENOUS
  Filled 2017-04-18 (×2): qty 200

## 2017-04-18 MED ORDER — AMIODARONE HCL IN DEXTROSE 360-4.14 MG/200ML-% IV SOLN
30.0000 mg/h | INTRAVENOUS | Status: DC
  Administered 2017-04-18 (×2): 30 mg/h via INTRAVENOUS
  Filled 2017-04-18: qty 200

## 2017-04-18 MED ORDER — LORAZEPAM 0.5 MG PO TABS
0.5000 mg | ORAL_TABLET | Freq: Every day | ORAL | Status: DC
  Administered 2017-04-18 – 2017-04-19 (×2): 0.5 mg via ORAL
  Filled 2017-04-18 (×2): qty 1
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
## 2017-04-18 NOTE — ED Notes (Signed)
Pt placed on zoll, MD at bedside

## 2017-04-18 NOTE — Assessment & Plan Note (Addendum)
1/19 -A flutter with RVR The pt will be taken to [facility] ER by her neighbor. She refused ambulance. She understands risks of not going to ER by ambulance including the risk of death.
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
## 2017-04-18 NOTE — Progress Notes (Signed)
Patient had a period of dyspnea while walking to the bathroom. Patient back in bed and resting, says she feels better. O2 started at 2L via nasal cannula. Will continue to monitor.

## 2017-04-18 NOTE — ED Notes (Signed)
Portable xray at bedside.

## 2017-04-18 NOTE — Assessment & Plan Note (Signed)
1/19 -A flutter with RVR Needs to go to ER

## 2017-04-18 NOTE — ED Notes (Signed)
Cardiology at bedside.

## 2017-04-18 NOTE — Progress Notes (Addendum)
CSW met with pt about pt's concerns with twin sister at home. Pt informed CSW that twin sister has dementia and normally the pt administers her medication. CSW worked with pt to develop a plan for her sister to receive her medication tonight.  Pt's neighbors will also check in on the sister to ensure safety.   Wendelyn Breslow, [facility]  [phone]

## 2017-04-18 NOTE — Assessment & Plan Note (Signed)
Better  Consider steroid injections Wt loss

## 2017-04-18 NOTE — Assessment & Plan Note (Signed)
B ?OM vs other
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
## 2017-04-18 NOTE — ED Triage Notes (Signed)
Per Pt, Pt is coming from home with complaints of SIB that started when she woke up this morning. Pt reports that she went to her PCP and they sent her over here to be evaluated. Complains of some ear pain and head pain for three days,

## 2017-04-19 DIAGNOSIS — R748 Abnormal levels of other serum enzymes: Secondary | ICD-10-CM

## 2017-04-19 DIAGNOSIS — R778 Other specified abnormalities of plasma proteins: Secondary | ICD-10-CM | POA: Diagnosis present

## 2017-04-19 DIAGNOSIS — I5043 Acute on chronic combined systolic (congestive) and diastolic (congestive) heart failure: Secondary | ICD-10-CM

## 2017-04-19 DIAGNOSIS — R7989 Other specified abnormal findings of blood chemistry: Secondary | ICD-10-CM

## 2017-04-19 LAB — GLUCOSE, CAPILLARY
GLUCOSE-CAPILLARY: 132 mg/dL — AB (ref 65–99)
GLUCOSE-CAPILLARY: 157 mg/dL — AB (ref 65–99)
Glucose-Capillary: 177 mg/dL — ABNORMAL HIGH (ref 65–99)
Glucose-Capillary: 173 mg/dL — ABNORMAL HIGH (ref 65–99)

## 2017-04-19 LAB — BASIC METABOLIC PANEL
ANION GAP: 12 (ref 5–15)
BUN: 16 mg/dL (ref 6–20)
CHLORIDE: 101 mmol/L (ref 101–111)
CO2: 22 mmol/L (ref 22–32)
Calcium: 9.2 mg/dL (ref 8.9–10.3)
Creatinine, Ser: 0.81 mg/dL (ref 0.44–1.00)
GFR calc Af Amer: >60 mL/min (ref >60)
GLUCOSE: 179 mg/dL — AB (ref 65–99)
POTASSIUM: 4.1 mmol/L (ref 3.5–5.1)
Sodium: 135 mmol/L (ref 135–145)

## 2017-04-19 LAB — CBC
HEMATOCRIT: 40 % (ref 36.0–46.0)
HEMOGLOBIN: 13.1 g/dL (ref 12.0–15.0)
MCH: 30.5 pg (ref 26.0–34.0)
MCHC: 32.8 g/dL (ref 30.0–36.0)
MCV: 93 fL (ref 78.0–100.0)
Platelets: 296 10*3/uL (ref 150–400)
RBC: 4.3 MIL/uL (ref 3.87–5.11)
RDW: 14.6 % (ref 11.5–15.5)
WBC: 10.5 10*3/uL (ref 4.0–10.5)

## 2017-04-19 LAB — TROPONIN I: TROPONIN I: 0.21 ng/mL — AB (ref <0.03)

## 2017-04-19 MED ORDER — AMIODARONE HCL 200 MG PO TABS
400.0000 mg | ORAL_TABLET | Freq: Two times a day (BID) | ORAL | Status: DC
  Administered 2017-04-19 – 2017-04-20 (×3): 400 mg via ORAL
  Filled 2017-04-19 (×3): qty 2

## 2017-04-19 MED ORDER — FUROSEMIDE 10 MG/ML IJ SOLN
40.0000 mg | Freq: Two times a day (BID) | INTRAMUSCULAR | Status: DC
  Administered 2017-04-19 – 2017-04-20 (×3): 40 mg via INTRAVENOUS
  Filled 2017-04-19 (×3): qty 4

## 2017-04-19 NOTE — Progress Notes (Signed)
Progress Note  Patient Name: Jessica [name] Date of Encounter: 04/19/2017  Primary Cardiologist: Skeet Latch, MD   Subjective   Breathing some better today, but not back at baseline. Has been eating canned soups and frozen meals. Reg wt is 250 lbs. Not on home O2. Takes care of her twin sister, worried about her.  Inpatient Medications    Scheduled Meds: . allopurinol  100 mg Oral Daily  . apixaban  5 mg Oral BID  . fentaNYL (SUBLIMAZE) injection  100 mcg Intravenous Once  . furosemide  20 mg Oral Daily  . glimepiride  1 mg Oral BID  . levothyroxine  75 mcg Oral QAC breakfast  . LORazepam  0.5 mg Oral QHS  . losartan  100 mg Oral Daily  . triamterene-hydrochlorothiazide  1 tablet Oral Daily   Continuous Infusions: . amiodarone 30 mg/hr (04/18/17 2133)   PRN Meds: acetaminophen, gabapentin, pantoprazole   Vital Signs    Vitals:   04/18/17 1736 04/18/17 1930 04/19/17 0034 04/19/17 0510  BP: 135/63 130/62 99/64 (!) 113/94  Pulse: 81 82 76 90  Resp: 20 20 20 20   Temp: 98.2 F (36.8 C) 98.2 F (36.8 C) 99 F (37.2 C) 99.5 F (37.5 C)  TempSrc: Oral Oral Oral Oral  SpO2: 94% 93% 96% 95%  Weight: 260 lb 2.3 oz (118 kg)   258 lb 14.4 oz (117.4 kg)  Height: 5\' 2"  (1.575 m)       Intake/Output Summary (Last 24 hours) at 04/19/2017 0847 Last data filed at 04/19/2017 0615 Gross per 24 hour  Intake 9400.39 ml  Output 600 ml  Net 8800.39 ml   Filed Weights   04/18/17 1736 04/19/17 0510  Weight: 260 lb 2.3 oz (118 kg) 258 lb 14.4 oz (117.4 kg)    Telemetry    SR, Vpacine - Personally Reviewed  ECG    SR, Vpacing - Personally Reviewed  Physical Exam   GEN: WD, obese female in mild resp distress on O2.   Neck: JVD 9-10 CM Cardiac: RRR, 2/6 murmurs, No rubs, or gallops.  Respiratory: decreased BS bases w/ rales bilaterally. GI: Soft, nontender, non-distended  MS: Trace edema; No deformity. Neuro:  Nonfocal  Psych: Normal affect   Labs      Chemistry Recent Labs  Lab 04/18/17 1235 04/18/17 1344 04/19/17 0543  NA 139 140 135  K 3.8 3.9 4.1  CL 106 103 101  CO2 22  --  22  GLUCOSE 189* 187* 179*  BUN 19 21* 16  CREATININE 0.82 0.80 0.81  CALCIUM 9.4  --  9.2  GFRNONAA >60  --  >60  GFRAA >60  --  >60  ANIONGAP 11  --  12     Hematology Recent Labs  Lab 04/18/17 1235 04/18/17 1344 04/19/17 0543  WBC 10.2  --  10.5  RBC 4.52  --  4.30  HGB 13.9 13.9 13.1  HCT 41.8 41.0 40.0  MCV 92.5  --  93.0  MCH 30.8  --  30.5  MCHC 33.3  --  32.8  RDW 14.2  --  14.6  PLT 299  --  296    Cardiac Enzymes Recent Labs  Lab 04/18/17 1336 04/18/17 1821 04/18/17 2236 04/19/17 0543  TROPONINI 0.22* 0.22* 0.19* 0.21*    Recent Labs  Lab 04/18/17 1243  TROPIPOC 0.25*     BNP Recent Labs  Lab 04/18/17 1336  BNP 211.3*     Radiology    Dg Chest  Port 1 View  Result Date: 04/18/2017 CLINICAL DATA:  Dyspnea. EXAM: PORTABLE CHEST 1 VIEW COMPARISON:  Radiographs of October 11, 2012. FINDINGS: Stable cardiomegaly and central pulmonary vascular congestion is noted. Left-sided pacemaker is unchanged in position. No pneumothorax or significant pleural effusion is noted. No acute pulmonary disease is noted. Bony thorax is unremarkable. IMPRESSION: Stable cardiomegaly and mild central pulmonary vascular congestion. Electronically Signed   By: Marijo Conception, M.D.   On: 04/18/2017 15:15    Cardiac Studies   none  Patient Profile     84 y.o. female w/ hypertension, hyperlipidemia, paroxysmalatrial flutter/fibrillation on Eliquis, Mobitz Type II s/p PPM, moderate aortic stenois,chronic systolic and diastolic heart failure w/ EF 45-50% 02/2017,DMand hypothyroidism, admitted 01/17 from PCP office w/ complaint of dyspnea and found to be in atrial fibrillation w/ RVR, ?VT also.   Assessment & Plan    Principal Problem: 1.  Atrial fibrillation with RVR ([place]), ?VT - IV amio started last pm w/ conversion to SR - no VT  overnight. - CHA2DS2VASc= 6 (age x 2, female, CHF, DM, HTN) - Eliquis added - MD advise on changing to po amio, ?400 mg bid  Active Problems: 2.  Acute on chronic combined systolic and diastolic CHF (congestive heart failure) ([place]) - pt about 10 lbs above dry wt - significant sodium unawareness in what she eats - needs options for canned/frozen foods low in Na - IV Lasix 40 mg bid for 24-48 hr - Pt may also have obesity hypoventilation syndrome, may need home O2  3.  Elevated troponin - not very high and in setting of CHF and elevated HR - MD advise if any ischemic eval needed.  4. AS - classic AS murmur - recent echo 02/2017 w/ mean gradient 27 mm - MD advise timing of next echo   For questions or updates, please contact [place] Please consult [email or web address] for contact info under Cardiology/STEMI.      Signed, Rosaria Ferries, PA-C  04/19/2017, 8:47 AM    Personally seen and examined. Agree with above.  Feels well.  No chest pain.  Normal shortness of breath.  Exam: Regular rate and rhythm, obese, alert, minimal crackles at bases.  2/6 systolic murmur classic of aortic stenosis.  Labs: Personally reviewed.  See above.  Assessment and plan:  Paroxysmal atrial fibrillation -This likely precipitated some of her acute diastolic heart failure.  She is feeling better in sinus rhythm.  She chemically converted with IV amiodarone.  We will transition her to 400 mg p.o. twice daily for 1 week then 200 mg p.o. twice a day for 1 week then 200 mg a day thereafter.  We will try to maintain sinus rhythm.  Continue with anticoagulation, Eliquis 5 mg twice a day.  Acute diastolic heart failure -Agree with IV Lasix try weight approximately 250.  Currently 258.  Candee Furbish, MD

## 2017-04-19 NOTE — Plan of Care (Signed)
  Health Behavior/Discharge Planning: Ability to manage health-related needs will improve 04/19/2017 0239 - Progressing by Tristan Schroeder, RN   Clinical Measurements: Respiratory complications will improve 04/19/2017 0239 - Progressing by Tristan Schroeder, RN
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
## 2017-04-20 LAB — GLUCOSE, CAPILLARY: GLUCOSE-CAPILLARY: 155 mg/dL — AB (ref 65–99)

## 2017-04-20 MED ORDER — POTASSIUM CHLORIDE CRYS ER 20 MEQ PO TBCR: 20.0000 meq | EXTENDED_RELEASE_TABLET | Freq: Every day | ORAL | Status: DC

## 2017-04-20 MED ORDER — POTASSIUM CHLORIDE CRYS ER 20 MEQ PO TBCR: 20.0000 meq | EXTENDED_RELEASE_TABLET | Freq: Every day | ORAL | 0 refills | Status: DC

## 2017-04-20 MED ORDER — FUROSEMIDE 20 MG PO TABS: 40.0000 mg | ORAL_TABLET | Freq: Every day | ORAL | 3 refills | Status: DC

## 2017-04-20 MED ORDER — AMIODARONE HCL 200 MG PO TABS: 200.0000 mg | ORAL_TABLET | Freq: Two times a day (BID) | ORAL | 1 refills | Status: DC

## 2017-04-20 NOTE — Progress Notes (Signed)
Paged oncall MD via the answering service to inform that the patient stopped pacing at 1:24am and has been experiencing AFib with BBB since. Awaiting a response.

## 2017-04-20 NOTE — Progress Notes (Signed)
Patient discharge teaching complete. Meds, diet, activity and follow up appointmnets reviewed and all questions answered. Copy of instructions given to patient and prescriptions sent to pharmacy. Patient discharged via wheelchair with neighbor.

## 2017-04-20 NOTE — Discharge Summary (Signed)
Discharge Summary    Patient ID: LENNON RICHINS,  MRN: [id number], DOB/AGE: [birth year] 84 y.o.  Admit date: 04/18/2017 Discharge date: 04/20/2017  Primary Care Provider: [name] Primary Cardiologist: Skeet Latch, MD  Discharge Diagnoses    Principal Problem:   Atrial fibrillation with RVR [facility]) Active Problems:   Elevated troponin   Acute on chronic combined systolic and diastolic CHF (congestive heart failure) (HCC)   Allergies Allergies  Allergen Reactions  . Coreg [Carvedilol] Other (See Comments)    Weak legs  . Relafen [Nabumetone] Other (See Comments)    Upset stomach  . Atorvastatin     Myalgias  . Calcium Other (See Comments)    unknown  . Codeine Other (See Comments)    unknown  . Rofecoxib Other (See Comments)    unknown  . Simvastatin     Myalgias  . Enalapril Maleate Other (See Comments)    REACTION: cough    Diagnostic Studies/Procedures    Echo 02/07/17: Study Conclusions - Left ventricle: The cavity size was mildly dilated. Systolic function was mildly reduced. The estimated ejection fraction was in the range of 45% to 50%. Left ventricular diastolic function parameters were normal. - Aortic valve: There was moderate stenosis. Valve area (VTI): 1.02 cm^2. Valve area (Vmax): 1.08 cm^2. Valve area (Vmean): 0.97 cm^2. - Pulmonary arteries: PA peak pressure: 32 mm Hg (S). - Impressions: Gradients similar to echo done in 2016.  Impressions: - Gradients similar to echo done in 2016.    History of Present Illness     84 y.o.femalewith hypertension, hyperlipidemia, paroxysmalatrial flutter/fibrillation on Eliquis, Mobitz Type II s/p PPM, moderate aortic stenois,chronic systolic and diastolic heart failure,DMand hypothyroidism, presenting to ED from PCP office w/ complaint of dyspnea and found to be in atrial fibrillation w/ RVR.There is also concern for possible VT on telemetry.Cardiology consulted to  assist with management  Pt is followed by Dr. Oval Linsey. Dr. Rayann Heman follows her PPM. Her most recent echo 02/2017 showed mildly reduced LVEF at 45-50% and moderate AS, mean gradient 27 mmHg. She was seen by Dr. Oval Linsey 02/2017 in clinic and doing well, in NSR. No symptoms at that time.   Today 04/18/17, she presented to PCP office w/ complaint of dyspnea and senttheED.EKG showed atrial flutter in the 120s.There is also concerns for runs of VT on telemetry. Max HR in the ED in the 160s. Thus IV amiodarone was initiatedby ED physician. POC troponin abnormal at 0.25. Actual lab troponin pending. BMP unremarkable with normal K at 3.8. Mg pending. BNP pending. CXR pending. No TSH lab ordered. Pt has hypothyroidism on levothyroxine. HR improvednow in the 110s. BP is stable.  Pt is currently asymptomatic at rest. Pt notes she was in her usual state of health until this morning, when she developed acute dyspnea and felt tired. She denies any associated CP. She has felt dizzy but denies syncope/ near syncope. No fever, chills, n/v/d, cough. No recent excess caffeine. No ETOH. No OTC medications. She reports fairly good compliance with her medications but admits that she may occasionally forget to take her evening meds, including Eliquis. She notes this usually only happens 1-2 times/month.  Hospital Course     Consultants: none  Patient was admitted to cardiology for IV diuresis and management of her Afib/flultter RVR.   Afib/flutter with RVR IV amiodarone was initiated with conversion to NSR. Amio was transitioned to PO dosing; however, she converted back to Afib but is now rate controlled. Will continue  amiodarone 200 mg BID for one month, then 200 mg daily thereafter.  Telemetry reviewed upon admission with no VT noted.   Elevated troponin Troponin: 0.22 --> 0.22 --> 0.19 --> 0.21. Trend is low and flat inconsistent with ACS, likely represents demand ischemia in the setting of Afib RVR and heart  failure exacerbation. EKG without ischemic changes. Pt continues to deny chest pain.  Acute on chronic systolic and diastolic heart failure Pt diuresed well on 40 mg IV lasix BID. She is overall net positive in Epic, but I&Os may not be charted correctly. Her weight is doewn to 251 lbs from 260 lbs on admission. Dry weight appears to be 249 lbs (at last clnic visit 02/14/17). Will continue lasix at 40 mg daily until follow up. Serum creatinine normal at 0.81 and K 4.1. She needs to continue low salt diet and daily weights.  Patient has been seen and examined by Dr. Marlou Porch today and is deemed stable for discharge. All follow up appointments have been scheduled and discharge medications are listed below.    _____________  Discharge Vitals Blood pressure (!) 119/93, pulse 64, temperature 98.1 F (36.7 C), temperature source Oral, resp. rate 18, height 5\' 2"  (1.575 m), weight 251 lb 6.4 oz (114 kg), SpO2 95 %.  Filed Weights   04/18/17 1736 04/19/17 0510 04/20/17 0458  Weight: 260 lb 2.3 oz (118 kg) 258 lb 14.4 oz (117.4 kg) 251 lb 6.4 oz (114 kg)    Labs & Radiologic Studies    CBC Recent Labs    04/18/17 1235 04/18/17 1344 04/19/17 0543  WBC 10.2  --  10.5  HGB 13.9 13.9 13.1  HCT 41.8 41.0 40.0  MCV 92.5  --  93.0  PLT 299  --  973   Basic Metabolic Panel Recent Labs    04/18/17 1235 04/18/17 1336 04/18/17 1344 04/19/17 0543  NA 139  --  140 135  K 3.8  --  3.9 4.1  CL 106  --  103 101  CO2 22  --   --  22  GLUCOSE 189*  --  187* 179*  BUN 19  --  21* 16  CREATININE 0.82  --  0.80 0.81  CALCIUM 9.4  --   --  9.2  MG  --  2.0  --   --    Liver Function Tests No results for input(s): AST, ALT, ALKPHOS, BILITOT, PROT, ALBUMIN in the last 72 hours. No results for input(s): LIPASE, AMYLASE in the last 72 hours. Cardiac Enzymes Recent Labs    04/18/17 1821 04/18/17 2236 04/19/17 0543  TROPONINI 0.22* 0.19* 0.21*   BNP Invalid input(s): POCBNP D-Dimer No results  for input(s): DDIMER in the last 72 hours. Hemoglobin A1C No results for input(s): HGBA1C in the last 72 hours. Fasting Lipid Panel No results for input(s): CHOL, HDL, LDLCALC, TRIG, CHOLHDL, LDLDIRECT in the last 72 hours. Thyroid Function Tests Recent Labs    04/18/17 1821  TSH 2.706   _____________  Dg Chest Port 1 View  Result Date: 04/18/2017 CLINICAL DATA:  Dyspnea. EXAM: PORTABLE CHEST 1 VIEW COMPARISON:  Radiographs of October 11, 2012. FINDINGS: Stable cardiomegaly and central pulmonary vascular congestion is noted. Left-sided pacemaker is unchanged in position. No pneumothorax or significant pleural effusion is noted. No acute pulmonary disease is noted. Bony thorax is unremarkable. IMPRESSION: Stable cardiomegaly and mild central pulmonary vascular congestion. Electronically Signed   By: Marijo Conception, M.D.   On: 04/18/2017 15:15  Disposition   Pt is being discharged home today in good condition.  Follow-up Plans & Appointments    Follow-up Information    Lendon Colonel, NP Follow up on 05/03/2017.   Specialties:  Nurse Practitioner, Radiology, Cardiology Why:  10:00 am for hospital follow up Contact information: [address] [place] [address] [phone]          Discharge Instructions    Diet - low sodium heart healthy   Complete by:  As directed    Increase activity slowly   Complete by:  As directed       Discharge Medications   Allergies as of 04/20/2017      Reactions   Coreg [carvedilol] Other (See Comments)   Weak legs   Relafen [nabumetone] Other (See Comments)   Upset stomach   Atorvastatin    Myalgias   Calcium Other (See Comments)   unknown   Codeine Other (See Comments)   unknown   Rofecoxib Other (See Comments)   unknown   Simvastatin    Myalgias   Enalapril Maleate Other (See Comments)   REACTION: cough      Medication List    TAKE these medications   allopurinol 100 MG tablet Commonly known as:   ZYLOPRIM TAKE 1 TABLET EVERY DAY What changed:    how much to take  how to take this  when to take this   amiodarone 200 MG tablet Commonly known as:  PACERONE Take 1 tablet (200 mg total) by mouth 2 (two) times daily. Then 200 mg daily thereafter.   apixaban 5 MG Tabs tablet Commonly known as:  ELIQUIS Take 1 tablet (5 mg total) by mouth 2 (two) times daily.   b complex vitamins tablet Take 1 tablet by mouth daily.   Cholecalciferol 1000 units tablet Take 1,000 Units by mouth daily.   furosemide 20 MG tablet Commonly known as:  LASIX Take 2 tablets (40 mg total) by mouth daily. What changed:    how much to take  additional instructions   gabapentin 100 MG capsule Commonly known as:  NEURONTIN Take 1-2 capsules (100-200 mg total) by mouth 3 (three) times daily as needed.   glimepiride 1 MG tablet Commonly known as:  AMARYL TAKE 1 TABLET TWICE DAILY What changed:    how much to take  how to take this  when to take this   HYDROcodone-acetaminophen 5-325 MG tablet Commonly known as:  NORCO/VICODIN Take 1 tablet by mouth every 8 (eight) hours as needed for moderate pain.   levothyroxine 75 MCG tablet Commonly known as:  SYNTHROID, LEVOTHROID TAKE 1 TABLET EVERY DAY   LORazepam 0.5 MG tablet Commonly known as:  ATIVAN Take 1 tablet (0.5 mg total) by mouth 2 (two) times daily. What changed:  when to take this   losartan 100 MG tablet Commonly known as:  COZAAR TAKE 1 TABLET EVERY DAY What changed:    how much to take  how to take this  when to take this   metFORMIN 750 MG 24 hr tablet Commonly known as:  GLUCOPHAGE-XR Take 1 tablet (750 mg total) by mouth daily with breakfast.   OCUVITE PRESERVISION Tabs Take 1 tablet by mouth 2 (two) times daily.   omeprazole 40 MG capsule Commonly known as:  PRILOSEC TAKE 1 CAPSULE EVERY DAY What changed:    how much to take  how to take this  when to take this   potassium chloride SA 20 MEQ  tablet  Commonly known as:  K-DUR,KLOR-CON Take 1 tablet (20 mEq total) by mouth daily. Start taking on:  04/21/2017   temazepam 15 MG capsule Commonly known as:  RESTORIL TAKE 1 CAPSULE AT BEDTIME AS NEEDED FOR SLEEP What changed:  See the new instructions.   tetrahydrozoline 0.05 % ophthalmic solution Place 1 drop into both eyes daily.   triamterene-hydrochlorothiazide 37.5-25 MG tablet Commonly known as:  MAXZIDE-25 TAKE 1 TABLET EVERY DAY   venlafaxine XR 75 MG 24 hr capsule Commonly known as:  EFFEXOR-XR TAKE 1 CAPSULE EVERY DAY WITH BREAKFAST What changed:  See the new instructions.        Outstanding Labs/Studies   BMP  Adjust lasix dose as needed  Duration of Discharge Encounter   Greater than 30 minutes including physician time.  Signed, Tami Lin Duke NP 04/20/2017, 11:48 AM   Personally seen and examined. Agree with above.  OK with DC. Will give amio 200 BID for one month, then 200 QD thereafter. Feels better with diuresis. Obese, lungs clear, RRR (back in sinus).   Candee Furbish, MD

## 2017-04-20 NOTE — Progress Notes (Signed)
Progress Note  Patient Name: Jessica [name] Date of Encounter: 04/20/2017  Primary Cardiologist: Skeet Latch, MD   Subjective   Pt is unaware that she is back in Afib. She states she is breathing well, but now has a dry cough. Her lower extremity swelling continues to improve. She wants to discharge home today.  Inpatient Medications    Scheduled Meds: . allopurinol  100 mg Oral Daily  . amiodarone  400 mg Oral BID  . apixaban  5 mg Oral BID  . fentaNYL (SUBLIMAZE) injection  100 mcg Intravenous Once  . furosemide  40 mg Intravenous BID  . glimepiride  1 mg Oral BID  . levothyroxine  75 mcg Oral QAC breakfast  . LORazepam  0.5 mg Oral QHS  . losartan  100 mg Oral Daily  . triamterene-hydrochlorothiazide  1 tablet Oral Daily   Continuous Infusions:  PRN Meds: acetaminophen, gabapentin, pantoprazole   Vital Signs    Vitals:   04/19/17 1919 04/20/17 0458 04/20/17 0600 04/20/17 0642  BP: (!) 115/57 (!) 95/59 97/62 (!) 119/93  Pulse: 82 72 64   Resp: 20 18    Temp: 98 F (36.7 C) 98.1 F (36.7 C)    TempSrc: Oral Oral    SpO2: 94% 95%    Weight:  251 lb 6.4 oz (114 kg)    Height:        Intake/Output Summary (Last 24 hours) at 04/20/2017 0940 Last data filed at 04/20/2017 0504 Gross per 24 hour  Intake 240 ml  Output 2700 ml  Net -2460 ml   Filed Weights   04/18/17 1736 04/19/17 0510 04/20/17 0458  Weight: 260 lb 2.3 oz (118 kg) 258 lb 14.4 oz (117.4 kg) 251 lb 6.4 oz (114 kg)    Telemetry    Afib in the 80-110s - Personally Reviewed  ECG    pending - Personally Reviewed  Physical Exam   GEN: No acute distress.   Neck: No JVD Cardiac: Irregular rhythm, regular rate, no murmurs, rubs, or gallops.  Respiratory: Clear to auscultation bilaterally. GI: Soft, nontender, non-distended  MS: 1+ edema with erythema on left lower extremity tender to touch; No deformity. Neuro:  Nonfocal  Psych: Normal affect   Labs    Chemistry Recent Labs    Lab 04/18/17 1235 04/18/17 1344 04/19/17 0543  NA 139 140 135  K 3.8 3.9 4.1  CL 106 103 101  CO2 22  --  22  GLUCOSE 189* 187* 179*  BUN 19 21* 16  CREATININE 0.82 0.80 0.81  CALCIUM 9.4  --  9.2  GFRNONAA >60  --  >60  GFRAA >60  --  >60  ANIONGAP 11  --  12     Hematology Recent Labs  Lab 04/18/17 1235 04/18/17 1344 04/19/17 0543  WBC 10.2  --  10.5  RBC 4.52  --  4.30  HGB 13.9 13.9 13.1  HCT 41.8 41.0 40.0  MCV 92.5  --  93.0  MCH 30.8  --  30.5  MCHC 33.3  --  32.8  RDW 14.2  --  14.6  PLT 299  --  296    Cardiac Enzymes Recent Labs  Lab 04/18/17 1336 04/18/17 1821 04/18/17 2236 04/19/17 0543  TROPONINI 0.22* 0.22* 0.19* 0.21*    Recent Labs  Lab 04/18/17 1243  TROPIPOC 0.25*     BNP Recent Labs  Lab 04/18/17 1336  BNP 211.3*     DDimer No results for input(s): DDIMER in  the last 168 hours.   Radiology    Dg Chest Port 1 View  Result Date: 04/18/2017 CLINICAL DATA:  Dyspnea. EXAM: PORTABLE CHEST 1 VIEW COMPARISON:  Radiographs of October 11, 2012. FINDINGS: Stable cardiomegaly and central pulmonary vascular congestion is noted. Left-sided pacemaker is unchanged in position. No pneumothorax or significant pleural effusion is noted. No acute pulmonary disease is noted. Bony thorax is unremarkable. IMPRESSION: Stable cardiomegaly and mild central pulmonary vascular congestion. Electronically Signed   By: Marijo Conception, M.D.   On: 04/18/2017 15:15    Cardiac Studies   Echo 02/07/17: Study Conclusions - Left ventricle: The cavity size was mildly dilated. Systolic   function was mildly reduced. The estimated ejection fraction was   in the range of 45% to 50%. Left ventricular diastolic function   parameters were normal. - Aortic valve: There was moderate stenosis. Valve area (VTI): 1.02   cm^2. Valve area (Vmax): 1.08 cm^2. Valve area (Vmean): 0.97   cm^2. - Pulmonary arteries: PA peak pressure: 32 mm Hg (S). - Impressions: Gradients  similar to echo done in 2016.  Impressions: - Gradients similar to echo done in 2016.  Patient Profile     84 y.o. female w/ hypertension, hyperlipidemia, paroxysmalatrial flutter/fibrillation on Eliquis, Mobitz Type II s/p PPM, moderate aortic stenois,chronic systolic and diastolic heart failure w/ EF 45-50% 02/2017,DMand hypothyroidism, admitted 01/17 from PCP office w/ complaint of dyspnea and found to be in atrial fibrillation w/ RVR, ?VT also. Pt converted to NSR on IV amiodarone but has since converted back to Afib.  Assessment & Plan    1. Atrial fibrillation with RVR - This patients CHA2DS2-VASc Score and unadjusted Ischemic Stroke Rate (% per year) is equal to 9.7 % stroke rate/year from a score of 34 (age, female, CHF, DM, HTN) - continue eliquis - pt converted to NSR on IV amiodarone, but converted back to Afib overnight  - telemetry today with Afib in the 80-110s - will continue PO amiodarone and consider additional IV amio bolus, pt is unaware of her rhythm, no RVR noted on telemetry - continue with diuresis as below   2. Acute on chronic systolic and diastolic heart failure - pt is diuresing on 40 mg IV lasix BID - she is overall net positive 5.9 L with 3.1 L urine output yesterday - this net positive is generally due to the IV amiodarone charted as 8L - will confirm this with nursing - weight is 251 lbs, down from 260 lbs on admission - dry weight appears to be 249 lbs (at last clinic visit on 02/14/17)  - pt appears to be breathing better, although now she reports a dry cough - She has received her morning dose of IV lasix, will recommend transitioning to PO dosing - patient wishes to discharge home today - encouraged low salt diet - sCr stable, K 4.1   3. Elevated troponin - troponin: 0.22 --> 0.22 --> 0.19 --> 0.21 - trend is low and flat, inconsistent with ACS, likely represents demand ischemia in the setting of Afib RVR and heart failure exacerbation  - EKG  without ischemic changes, repeat EKG pending    For questions or updates, please contact [facility] Please consult [email or web address] for contact info under Cardiology/STEMI.      Signed, Tami Lin Duke, PA  04/20/2017, 9:40 AM    Personally seen and examined. Agree with above.  OK with DC. Will give amio 200 BID for one month, then 200 QD thereafter. Feels better  with diuresis. Obese, lungs clear, RRR (back in sinus).   Candee Furbish, MD

## 2017-04-23 ENCOUNTER — Ambulatory Visit (INDEPENDENT_AMBULATORY_CARE_PROVIDER_SITE_OTHER)
Admission: RE | Admit: 2017-04-23 | Discharge: 2017-04-23 | Disposition: A | Discharge status: Home / Self Care | Payer: Medicare HMO | Source: Ambulatory Visit | Attending: Internal Medicine | Admitting: Internal Medicine

## 2017-04-23 ENCOUNTER — Encounter: Payer: Self-pay | Admitting: Internal Medicine

## 2017-04-23 ENCOUNTER — Ambulatory Visit (INDEPENDENT_AMBULATORY_CARE_PROVIDER_SITE_OTHER): Payer: Medicare HMO | Admitting: Internal Medicine

## 2017-04-23 VITALS — BP 130/70 | HR 72 | Temp 98.6°F | Ht 62.0 in | Wt 251.0 lb

## 2017-04-23 DIAGNOSIS — J988 Other specified respiratory disorders: Secondary | ICD-10-CM | POA: Insufficient documentation

## 2017-04-23 DIAGNOSIS — R059 Cough, unspecified: Secondary | ICD-10-CM

## 2017-04-23 DIAGNOSIS — R05 Cough: Secondary | ICD-10-CM

## 2017-04-23 MED ORDER — AMOXICILLIN-POT CLAVULANATE 875-125 MG PO TABS: 1.0000 | ORAL_TABLET | Freq: Two times a day (BID) | ORAL | 0 refills | Status: AC

## 2017-04-23 MED ORDER — PROMETHAZINE-DM 6.25-15 MG/5ML PO SYRP: 5.0000 mL | ORAL_SOLUTION | Freq: Four times a day (QID) | ORAL | 0 refills | Status: DC | PRN

## 2017-04-23 NOTE — Progress Notes (Signed)
Subjective:  Patient ID: Jessica [name], female    DOB: March 06, 1934  Age: 84 y.o. MRN: [id number]  CC: Cough  NEW TO ME  HPI [name] presents for a 4-day history of cough productive of thick pink and green phlegm with low-grade fever and shortness of breath.  She denies chills, chest pain, or gross hemoptysis.  Outpatient Medications Prior to Visit  Medication Sig Dispense Refill  . allopurinol (ZYLOPRIM) 100 MG tablet TAKE 1 TABLET EVERY DAY (Patient taking differently: TAKE 100 mg TABLET EVERY DAY) 90 tablet 3  . amiodarone (PACERONE) 200 MG tablet Take 1 tablet (200 mg total) by mouth 2 (two) times daily. Then 200 mg daily thereafter. 60 tablet 1  . apixaban (ELIQUIS) 5 MG TABS tablet Take 1 tablet (5 mg total) by mouth 2 (two) times daily. 180 tablet 1  . b complex vitamins tablet Take 1 tablet by mouth daily.    . Cholecalciferol 1000 UNITS tablet Take 1,000 Units by mouth daily.      . furosemide (LASIX) 20 MG tablet Take 2 tablets (40 mg total) by mouth daily. 60 tablet 3  . gabapentin (NEURONTIN) 100 MG capsule Take 1-2 capsules (100-200 mg total) by mouth 3 (three) times daily as needed. 100 capsule 3  . glimepiride (AMARYL) 1 MG tablet TAKE 1 TABLET TWICE DAILY (Patient taking differently: TAKE 1mg   TABLET TWICE DAILY) 180 tablet 3  . HYDROcodone-acetaminophen (NORCO/VICODIN) 5-325 MG tablet Take 1 tablet by mouth every 8 (eight) hours as needed for moderate pain. 100 tablet 0  . levothyroxine (SYNTHROID, LEVOTHROID) 75 MCG tablet TAKE 1 TABLET EVERY DAY 90 tablet 3  . LORazepam (ATIVAN) 0.5 MG tablet Take 1 tablet (0.5 mg total) by mouth 2 (two) times daily. (Patient taking differently: Take 0.5 mg by mouth at bedtime. ) 180 tablet 1  . losartan (COZAAR) 100 MG tablet TAKE 1 TABLET EVERY DAY (Patient taking differently: TAKE 100 mg  TABLET EVERY DAY) 90 tablet 3  . metFORMIN (GLUCOPHAGE-XR) 750 MG 24 hr tablet Take 1 tablet (750 mg total) by mouth daily with  breakfast. 90 tablet 3  . Multiple Vitamins-Minerals (OCUVITE PRESERVISION) TABS Take 1 tablet by mouth 2 (two) times daily.    [name] omeprazole (PRILOSEC) 40 MG capsule TAKE 1 CAPSULE EVERY DAY (Patient taking differently: TAKE 40 mg CAPSULE as needed) 90 capsule 3  . potassium chloride SA (K-DUR,KLOR-CON) 20 MEQ tablet Take 1 tablet (20 mEq total) by mouth daily. 30 tablet 0  . temazepam (RESTORIL) 15 MG capsule TAKE 1 CAPSULE AT BEDTIME AS NEEDED FOR SLEEP (Patient taking differently: TAKE 15 mg CAPSULE AT BEDTIME) 90 capsule 1  . tetrahydrozoline 0.05 % ophthalmic solution Place 1 drop into both eyes daily.    [name] triamterene-hydrochlorothiazide (MAXZIDE-25) 37.5-25 MG tablet TAKE 1 TABLET EVERY DAY 90 tablet 3  . venlafaxine XR (EFFEXOR-XR) 75 MG 24 hr capsule TAKE 1 CAPSULE EVERY DAY WITH BREAKFAST (Patient taking differently: TAKE 75 mg CAPSULE EVERY DAY WITH BREAKFAST) 90 capsule 2   No facility-administered medications prior to visit.     ROS Review of Systems  Constitutional: Positive for fever. Negative for chills, diaphoresis and fatigue.  HENT: Negative.  Negative for facial swelling, sinus pressure, sore throat and trouble swallowing.   Eyes: Negative.   Respiratory: Positive for cough and shortness of breath. Negative for choking and wheezing.   Cardiovascular: Negative.  Negative for chest pain, palpitations and leg swelling.  Gastrointestinal: Negative for abdominal pain, constipation,  diarrhea, nausea and vomiting.  Endocrine: Negative.   Genitourinary: Negative.  Negative for difficulty urinating.  Musculoskeletal: Negative.  Negative for myalgias.  Skin: Negative.  Negative for rash.  Allergic/Immunologic: Negative.   Neurological: Negative.  Negative for dizziness and headaches.  Hematological: Negative for adenopathy. Does not bruise/bleed easily.  Psychiatric/Behavioral: Negative.     Objective:  BP 130/70 (BP Location: Left Arm, Patient Position: Sitting, Cuff Size:  Large)   Pulse 72   Temp 98.6 F (37 C) (Oral)   Ht 5\' 2"  (1.575 m)   Wt 251 lb (113.9 kg)   SpO2 94%   BMI 45.91 kg/m   BP Readings from Last 3 Encounters:  04/23/17 130/70  04/20/17 (!) 119/93  04/18/17 132/74    Wt Readings from Last 3 Encounters:  04/23/17 251 lb (113.9 kg)  04/20/17 251 lb 6.4 oz (114 kg)  04/18/17 259 lb (117.5 kg)    Physical Exam  Constitutional: She is oriented to person, place, and time. No distress.  HENT:  Mouth/Throat: No oropharyngeal exudate.  Eyes: Conjunctivae are normal. Left eye exhibits no discharge. No scleral icterus.  Neck: Normal range of motion. Neck supple. No JVD present. No thyromegaly present.  Cardiovascular:  Murmur (1/6 SEM) heard. Pulmonary/Chest: Effort normal. No accessory muscle usage. No tachypnea. No respiratory distress. She has no wheezes. She has rhonchi in the right lower field. She has no rales.  Abdominal: Soft. Bowel sounds are normal. She exhibits no distension and no mass. There is no tenderness.  Musculoskeletal: Normal range of motion. She exhibits no edema, tenderness or deformity.  Lymphadenopathy:    She has no cervical adenopathy.  Neurological: She is alert and oriented to person, place, and time.  Skin: Skin is warm and dry. No rash noted. She is not diaphoretic. No erythema. No pallor.  Vitals reviewed.   Lab Results  Component Value Date   WBC 10.5 04/19/2017   HGB 13.1 04/19/2017   HCT 40.0 04/19/2017   PLT 296 04/19/2017   GLUCOSE 179 (H) 04/19/2017   CHOL 191 02/19/2017   TRIG 183.0 (H) 02/19/2017   HDL 38.90 (L) 02/19/2017   LDLDIRECT 155.9 09/30/2012   LDLCALC 116 (H) 02/19/2017   ALT 22 12/08/2016   AST 22 12/08/2016   NA 135 04/19/2017   K 4.1 04/19/2017   CL 101 04/19/2017   CREATININE 0.81 04/19/2017   BUN 16 04/19/2017   CO2 22 04/19/2017   TSH 2.706 04/18/2017   INR 3.3 08/26/2009   HGBA1C 7.0 (H) 02/16/2017   MICROALBUR 4.5 (H) 12/02/2009    Dg Chest Port 1  View  Result Date: 04/18/2017 CLINICAL DATA:  Dyspnea. EXAM: PORTABLE CHEST 1 VIEW COMPARISON:  Radiographs of October 11, 2012. FINDINGS: Stable cardiomegaly and central pulmonary vascular congestion is noted. Left-sided pacemaker is unchanged in position. No pneumothorax or significant pleural effusion is noted. No acute pulmonary disease is noted. Bony thorax is unremarkable. IMPRESSION: Stable cardiomegaly and mild central pulmonary vascular congestion. Electronically Signed   By: Marijo Conception, M.D.   On: 04/18/2017 15:15   Dg Chest 2 View  Result Date: 04/23/2017 CLINICAL DATA:  Cough, congestion, chills and fever for 3 days. EXAM: CHEST  2 VIEW COMPARISON:  CT chest 10/07/2008. PA and lateral chest 10/11/2012. Single-view of the chest 04/18/2017. FINDINGS: Hiatal hernia is noted. Calcified granuloma is seen left upper lobe. Pulmonary interstitium is mildly prominent. No consolidative process, pneumothorax or effusion. Heart size is normal. Pacing device is in place.  No acute bony abnormality. IMPRESSION: No acute disease. Hiatal hernia. Electronically Signed   By: Inge Rise M.D.   On: 04/23/2017 15:05    Assessment & Plan:   Fabian was seen today for cough.  Diagnoses and all orders for this visit:  Cough- Her chest x-ray is negative for mass, infiltrate, or effusion.  I will treat the symptoms with Phenergan DM. -     DG Chest 2 View; Future -     promethazine-dextromethorphan (PROMETHAZINE-DM) 6.25-15 MG/5ML syrup; Take 5 mLs by mouth 4 (four) times daily as needed for cough.  RTI (respiratory tract infection)- I will treat the infection with Augmentin. -     amoxicillin-clavulanate (AUGMENTIN) 875-125 MG tablet; Take 1 tablet by mouth 2 (two) times daily for 7 days. -     promethazine-dextromethorphan (PROMETHAZINE-DM) 6.25-15 MG/5ML syrup; Take 5 mLs by mouth 4 (four) times daily as needed for cough.   I am having Jessica [name] start on amoxicillin-clavulanate and  promethazine-dextromethorphan. I am also having her maintain her Cholecalciferol, OCUVITE PRESERVISION, b complex vitamins, levothyroxine, temazepam, apixaban, gabapentin, allopurinol, glimepiride, losartan, omeprazole, triamterene-hydrochlorothiazide, venlafaxine XR, LORazepam, metFORMIN, HYDROcodone-acetaminophen, tetrahydrozoline, amiodarone, furosemide, and potassium chloride SA.  Meds ordered this encounter  Medications  . amoxicillin-clavulanate (AUGMENTIN) 875-125 MG tablet    Sig: Take 1 tablet by mouth 2 (two) times daily for 7 days.    Dispense:  14 tablet    Refill:  0  . promethazine-dextromethorphan (PROMETHAZINE-DM) 6.25-15 MG/5ML syrup    Sig: Take 5 mLs by mouth 4 (four) times daily as needed for cough.    Dispense:  118 mL    Refill:  0     Follow-up: No Follow-up on file.  Scarlette Calico, MD

## 2017-04-23 NOTE — Patient Instructions (Signed)
Cough, Adult  Coughing is a reflex that clears your throat and your airways. Coughing helps to heal and protect your lungs. It is normal to cough occasionally, but a cough that happens with other symptoms or lasts a long time may be a sign of a condition that needs treatment. A cough may last only 2-3 weeks (acute), or it may last longer than 8 weeks (chronic).  What are the causes?  Coughing is commonly caused by:   Breathing in substances that irritate your lungs.   A viral or bacterial respiratory infection.   Allergies.   Asthma.   Postnasal drip.   Smoking.   Acid backing up from the stomach into the esophagus (gastroesophageal reflux).   Certain medicines.   Chronic lung problems, including COPD (or rarely, lung cancer).   Other medical conditions such as heart failure.    Follow these instructions at home:  Pay attention to any changes in your symptoms. Take these actions to help with your discomfort:   Take medicines only as told by your health care provider.  ? If you were prescribed an antibiotic medicine, take it as told by your health care provider. Do not stop taking the antibiotic even if you start to feel better.  ? Talk with your health care provider before you take a cough suppressant medicine.   Drink enough fluid to keep your urine clear or pale yellow.   If the air is dry, use a cold steam vaporizer or humidifier in your bedroom or your home to help loosen secretions.   Avoid anything that causes you to cough at work or at home.   If your cough is worse at night, try sleeping in a semi-upright position.   Avoid cigarette smoke. If you smoke, quit smoking. If you need help quitting, ask your health care provider.   Avoid caffeine.   Avoid alcohol.   Rest as needed.    Contact a health care provider if:   You have new symptoms.   You cough up pus.   Your cough does not get better after 2-3 weeks, or your cough gets worse.   You cannot control your cough with suppressant  medicines and you are losing sleep.   You develop pain that is getting worse or pain that is not controlled with pain medicines.   You have a fever.   You have unexplained weight loss.   You have night sweats.  Get help right away if:   You cough up blood.   You have difficulty breathing.   Your heartbeat is very fast.  This information is not intended to replace advice given to you by your health care provider. Make sure you discuss any questions you have with your health care provider.  Document Released: 09/16/2010 Document Revised: 08/26/2015 Document Reviewed: 05/27/2014  Elsevier Interactive Patient Education  2018 [employer]

## 2017-04-24 ENCOUNTER — Other Ambulatory Visit: Payer: Self-pay

## 2017-04-24 ENCOUNTER — Encounter: Payer: Self-pay | Admitting: Adult Health

## 2017-04-24 NOTE — Patient Outreach (Signed)
[facility]) Care Management  04/24/2017  Jessica [name] [birth year] [id number]   Referral received. No outreach warranted at this time. TOC will be completed by primary care provider office who will refer to [facility] care mgmt if needed.  Plan: RN CM will close case and notify care management assistant of case status.  Jone Baseman, RN, MSN [facility] Care Management Care Management Coordinator Direct Line [phone] Toll Free: [phone]  Fax: [phone]

## 2017-05-03 ENCOUNTER — Ambulatory Visit: Payer: Medicare HMO | Admitting: Adult Health
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
## 2017-05-05 ENCOUNTER — Other Ambulatory Visit: Payer: Self-pay | Admitting: Internal Medicine
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
## 2017-05-21 ENCOUNTER — Encounter: Payer: Self-pay | Admitting: Adult Health

## 2017-05-21 ENCOUNTER — Ambulatory Visit: Payer: Medicare HMO | Admitting: Adult Health

## 2017-05-21 VITALS — BP 136/80 | HR 97 | Ht 62.0 in | Wt 252.0 lb

## 2017-05-21 DIAGNOSIS — I5042 Chronic combined systolic (congestive) and diastolic (congestive) heart failure: Secondary | ICD-10-CM

## 2017-05-21 DIAGNOSIS — I10 Essential (primary) hypertension: Secondary | ICD-10-CM

## 2017-05-21 DIAGNOSIS — I4891 Unspecified atrial fibrillation: Secondary | ICD-10-CM | POA: Diagnosis not present

## 2017-05-21 DIAGNOSIS — E7800 Pure hypercholesterolemia, unspecified: Secondary | ICD-10-CM

## 2017-05-21 MED ORDER — POTASSIUM CHLORIDE CRYS ER 20 MEQ PO TBCR: 20.0000 meq | EXTENDED_RELEASE_TABLET | Freq: Every day | ORAL | 0 refills | Status: DC

## 2017-05-21 NOTE — Patient Instructions (Signed)
Medication Instructions:  NO CHANGES-Your physician recommends that you continue on your current medications as directed. Please refer to the Current Medication list given to you today.  If you need a refill on your cardiac medications before your next appointment, please call your pharmacy.  Special Instructions: KEEP APPT WITH DEVICE CLINIC  Follow-Up: Your physician wants you to follow-up in: 3 MONTHS WITH DR [facility]   Thank you for choosing [facility] at [facility]!!

## 2017-05-30 ENCOUNTER — Other Ambulatory Visit: Payer: Self-pay | Admitting: Internal Medicine

## 2017-05-30 ENCOUNTER — Ambulatory Visit (INDEPENDENT_AMBULATORY_CARE_PROVIDER_SITE_OTHER): Payer: Medicare HMO | Admitting: Internal Medicine

## 2017-05-30 ENCOUNTER — Encounter: Payer: Self-pay | Admitting: Internal Medicine

## 2017-05-30 VITALS — BP 132/74 | HR 80 | Temp 97.9°F | Ht 62.0 in | Wt 251.0 lb

## 2017-05-30 DIAGNOSIS — I10 Essential (primary) hypertension: Secondary | ICD-10-CM | POA: Diagnosis not present

## 2017-05-30 DIAGNOSIS — F411 Generalized anxiety disorder: Secondary | ICD-10-CM | POA: Diagnosis not present

## 2017-05-30 DIAGNOSIS — I480 Paroxysmal atrial fibrillation: Secondary | ICD-10-CM | POA: Diagnosis not present

## 2017-05-30 DIAGNOSIS — L52 Erythema nodosum: Secondary | ICD-10-CM

## 2017-05-30 DIAGNOSIS — E118 Type 2 diabetes mellitus with unspecified complications: Secondary | ICD-10-CM | POA: Diagnosis not present

## 2017-05-30 MED ORDER — TEMAZEPAM 15 MG PO CAPS: ORAL_CAPSULE | ORAL | 1 refills | Status: DC

## 2017-05-30 NOTE — Assessment & Plan Note (Signed)
Wt Readings from Last 3 Encounters:  05/30/17 251 lb (113.9 kg)  05/21/17 252 lb (114.3 kg)  04/23/17 251 lb (113.9 kg)

## 2017-05-30 NOTE — Assessment & Plan Note (Signed)
Dtr had a leg amputated PVD in 12/18

## 2017-05-30 NOTE — Progress Notes (Signed)
Subjective:  Patient ID: Jessica [name], female    DOB: February 18, 1934  Age: 84 y.o. MRN: [id number]  CC: No chief complaint on file.   HPI Jessica [name] presents for LE pains, HTN, A fib, edema. LLE pain is better. A fib admission in Jan 2019 was reviewed  Outpatient Medications Prior to Visit  Medication Sig Dispense Refill  . allopurinol (ZYLOPRIM) 100 MG tablet TAKE 1 TABLET EVERY DAY (Patient taking differently: TAKE 100 mg TABLET EVERY DAY) 90 tablet 3  . apixaban (ELIQUIS) 5 MG TABS tablet Take 1 tablet (5 mg total) by mouth 2 (two) times daily. 180 tablet 1  . b complex vitamins tablet Take 1 tablet by mouth daily.    . Cholecalciferol 1000 UNITS tablet Take 1,000 Units by mouth daily.      . furosemide (LASIX) 20 MG tablet Take 2 tablets (40 mg total) by mouth daily. 60 tablet 3  . glimepiride (AMARYL) 1 MG tablet TAKE 1 TABLET TWICE DAILY (Patient taking differently: TAKE 1mg   TABLET TWICE DAILY) 180 tablet 3  . HYDROcodone-acetaminophen (NORCO/VICODIN) 5-325 MG tablet Take 1 tablet by mouth every 8 (eight) hours as needed for moderate pain. 100 tablet 0  . levothyroxine (SYNTHROID, LEVOTHROID) 75 MCG tablet TAKE 1 TABLET EVERY DAY 90 tablet 3  . LORazepam (ATIVAN) 0.5 MG tablet Take 1 tablet (0.5 mg total) by mouth 2 (two) times daily. (Patient taking differently: Take 0.5 mg by mouth at bedtime. ) 180 tablet 1  . losartan (COZAAR) 100 MG tablet TAKE 1 TABLET EVERY DAY (Patient taking differently: TAKE 100 mg  TABLET EVERY DAY) 90 tablet 3  . metFORMIN (GLUCOPHAGE-XR) 750 MG 24 hr tablet Take 1 tablet (750 mg total) by mouth daily with breakfast. 90 tablet 3  . Multiple Vitamins-Minerals (OCUVITE PRESERVISION) TABS Take 1 tablet by mouth 2 (two) times daily.    [name] omeprazole (PRILOSEC) 40 MG capsule TAKE 1 CAPSULE EVERY DAY (Patient taking differently: TAKE 40 mg CAPSULE as needed) 90 capsule 3  . potassium chloride SA (K-DUR,KLOR-CON) 20 MEQ tablet Take 1 tablet (20 mEq  total) by mouth daily. 30 tablet 0  . temazepam (RESTORIL) 15 MG capsule TAKE 1 CAPSULE AT BEDTIME AS NEEDED  FOR  SLEEP 90 capsule 1  . tetrahydrozoline 0.05 % ophthalmic solution Place 1 drop into both eyes daily.    [name] triamterene-hydrochlorothiazide (MAXZIDE-25) 37.5-25 MG tablet TAKE 1 TABLET EVERY DAY 90 tablet 3  . venlafaxine XR (EFFEXOR-XR) 75 MG 24 hr capsule TAKE 1 CAPSULE EVERY DAY WITH BREAKFAST (Patient taking differently: TAKE 75 mg CAPSULE EVERY DAY WITH BREAKFAST) 90 capsule 2  . amiodarone (PACERONE) 200 MG tablet Take 1 tablet (200 mg total) by mouth 2 (two) times daily. Then 200 mg daily thereafter. 60 tablet 1   No facility-administered medications prior to visit.     ROS Review of Systems  Constitutional: Positive for fatigue. Negative for activity change, appetite change, chills and unexpected weight change.  HENT: Negative for congestion, mouth sores and sinus pressure.   Eyes: Negative for visual disturbance.  Respiratory: Negative for cough and chest tightness.   Cardiovascular: Positive for leg swelling.  Gastrointestinal: Negative for abdominal pain and nausea.  Genitourinary: Negative for difficulty urinating, frequency and vaginal pain.  Musculoskeletal: Positive for back pain. Negative for gait problem.  Skin: Negative for pallor.  Neurological: Negative for dizziness, tremors, weakness, numbness and headaches.  Psychiatric/Behavioral: Negative for confusion and sleep disturbance.    Objective:  BP  132/74 (BP Location: Left Arm, Patient Position: Sitting, Cuff Size: Large)   Pulse 80   Temp 97.9 F (36.6 C) (Oral)   Ht 5\' 2"  (1.575 m)   Wt 251 lb (113.9 kg)   SpO2 98%   BMI 45.91 kg/m   BP Readings from Last 3 Encounters:  05/30/17 132/74  05/21/17 136/80  04/23/17 130/70    Wt Readings from Last 3 Encounters:  05/30/17 251 lb (113.9 kg)  05/21/17 252 lb (114.3 kg)  04/23/17 251 lb (113.9 kg)    Physical Exam  Constitutional: She appears  well-developed. No distress.  HENT:  Head: Normocephalic.  Right Ear: External ear normal.  Left Ear: External ear normal.  Nose: Nose normal.  Mouth/Throat: Oropharynx is clear and moist.  Eyes: Conjunctivae are normal. Pupils are equal, round, and reactive to light. Right eye exhibits no discharge. Left eye exhibits no discharge.  Neck: Normal range of motion. Neck supple. No JVD present. No tracheal deviation present. No thyromegaly present.  Cardiovascular: Regular rhythm and normal heart sounds.  Pulmonary/Chest: No stridor. No respiratory distress. She has no wheezes.  Abdominal: Soft. Bowel sounds are normal. She exhibits no distension and no mass. There is no tenderness. There is no rebound and no guarding.  Musculoskeletal: She exhibits tenderness. She exhibits no edema.  Lymphadenopathy:    She has no cervical adenopathy.  Neurological: She displays normal reflexes. No cranial nerve deficit. She exhibits normal muscle tone. Coordination normal.  Skin: No rash noted. No erythema.  Psychiatric: She has a normal mood and affect. Her behavior is normal. Judgment and thought content normal.  L shoulder blade /rhomboid is a little tender irreg irreg RR  Lab Results  Component Value Date   WBC 10.5 04/19/2017   HGB 13.1 04/19/2017   HCT 40.0 04/19/2017   PLT 296 04/19/2017   GLUCOSE 179 (H) 04/19/2017   CHOL 191 02/19/2017   TRIG 183.0 (H) 02/19/2017   HDL 38.90 (L) 02/19/2017   LDLDIRECT 155.9 09/30/2012   LDLCALC 116 (H) 02/19/2017   ALT 22 12/08/2016   AST 22 12/08/2016   NA 135 04/19/2017   K 4.1 04/19/2017   CL 101 04/19/2017   CREATININE 0.81 04/19/2017   BUN 16 04/19/2017   CO2 22 04/19/2017   TSH 2.706 04/18/2017   INR 3.3 08/26/2009   HGBA1C 7.0 (H) 02/16/2017   MICROALBUR 4.5 (H) 12/02/2009    Dg Chest 2 View  Result Date: 04/23/2017 CLINICAL DATA:  Cough, congestion, chills and fever for 3 days. EXAM: CHEST  2 VIEW COMPARISON:  CT chest 10/07/2008. PA  and lateral chest 10/11/2012. Single-view of the chest 04/18/2017. FINDINGS: Hiatal hernia is noted. Calcified granuloma is seen left upper lobe. Pulmonary interstitium is mildly prominent. No consolidative process, pneumothorax or effusion. Heart size is normal. Pacing device is in place. No acute bony abnormality. IMPRESSION: No acute disease. Hiatal hernia. Electronically Signed   By: Inge Rise M.D.   On: 04/23/2017 15:05    Assessment & Plan:   There are no diagnoses linked to this encounter. I am having Jessica [name] maintain her Cholecalciferol, OCUVITE PRESERVISION, b complex vitamins, levothyroxine, apixaban, allopurinol, glimepiride, losartan, omeprazole, triamterene-hydrochlorothiazide, venlafaxine XR, LORazepam, metFORMIN, HYDROcodone-acetaminophen, tetrahydrozoline, amiodarone, furosemide, temazepam, and potassium chloride SA.  No orders of the defined types were placed in this encounter.    Follow-up: No Follow-up on file.  Walker Kehr, MD

## 2017-05-30 NOTE — Assessment & Plan Note (Signed)
Triamt/HCTZ, Losartan 

## 2017-05-30 NOTE — Assessment & Plan Note (Signed)
Eliquis Rate controlled 

## 2017-05-30 NOTE — Assessment & Plan Note (Signed)
Metformin 

## 2017-05-30 NOTE — Assessment & Plan Note (Signed)
Erythema Nodosum vs liponecrosis vs cellulitis vs venous insufficiency Better

## 2017-06-16 ENCOUNTER — Other Ambulatory Visit: Payer: Self-pay | Admitting: Internal Medicine

## 2017-06-18 ENCOUNTER — Other Ambulatory Visit: Payer: Self-pay | Admitting: *Deleted

## 2017-06-18 MED ORDER — POTASSIUM CHLORIDE CRYS ER 20 MEQ PO TBCR: 20.0000 meq | EXTENDED_RELEASE_TABLET | Freq: Every day | ORAL | 1 refills | Status: DC

## 2017-06-21 ENCOUNTER — Ambulatory Visit: Payer: Self-pay

## 2017-06-21 NOTE — Telephone Encounter (Signed)
noted 

## 2017-06-21 NOTE — Telephone Encounter (Signed)
Pt. Reports she has episodes of her "heart beating fast " this week. Last minutes to 1 hour. Denies any chest pain - states " I feel winded and know it's up." Reports is taking medication as ordered. Offered another [place] OV due to no availability per [facility]. Appointment made for tomorrow . Instructed to call 911 or go to ED if heart rate goes back up again. Verbalizes understanding.  Reason for Disposition . [1] Palpitations AND [2] no improvement after following Care Advice  Answer Assessment - Initial Assessment Questions 1. DESCRIPTION: "Please describe your heart rate or heart beat that you are having" (e.g., fast/slow, regular/irregular, skipped or extra beats, "palpitations")     144 - 135 then goes down. No chest 2. ONSET: "When did it start?" (Minutes, hours or days)      Started Sunday on and off 3. DURATION: "How long does it last" (e.g., seconds, minutes, hours)     Lasts I hour 4. PATTERN "Does it come and go, or has it been constant since it started?"  "Does it get worse with exertion?"   "Are you feeling it now?"     Comes and goes 5. TAP: "Using your hand, can you tap out what you are feeling on a chair or table in front of you, so that I can hear?" (Note: not all patients can do this)       HR 90 BP 118/90 6. HEART RATE: "Can you tell me your heart rate?" "How many beats in 15 seconds?"  (Note: not all patients can do this)       90  7. RECURRENT SYMPTOM: "Have you ever had this before?" If so, ask: "When was the last time?" and "What happened that time?"      Yes 8. CAUSE: "What do you think is causing the palpitations?"      A- fib 9. CARDIAC HISTORY: "Do you have any history of heart disease?" (e.g., heart attack, angina, bypass surgery, angioplasty, arrhythmia)      A-fib 10. OTHER SYMPTOMS: "Do you have any other symptoms?" (e.g., dizziness, chest pain, sweating, difficulty breathing)       Shortness of breath when this happens 11. PREGNANCY: "Is there any chance  you are pregnant?" "When was your last menstrual period?"       No  Protocols used: HEART RATE AND HEARTBEAT QUESTIONS-A-AH

## 2017-06-22 ENCOUNTER — Ambulatory Visit: Payer: Medicare HMO | Admitting: Internal Medicine

## 2017-06-22 ENCOUNTER — Inpatient Hospital Stay (HOSPITAL_COMMUNITY)
Admission: EM | Admit: 2017-06-22 | Discharge: 2017-06-26 | DRG: 292 | Disposition: A | Discharge status: Home / Self Care | Payer: Medicare HMO | Attending: Internal Medicine | Admitting: Internal Medicine

## 2017-06-22 ENCOUNTER — Emergency Department (HOSPITAL_COMMUNITY): Payer: Medicare HMO

## 2017-06-22 ENCOUNTER — Encounter (HOSPITAL_COMMUNITY): Payer: Self-pay | Admitting: Emergency Medicine

## 2017-06-22 ENCOUNTER — Other Ambulatory Visit: Payer: Self-pay

## 2017-06-22 ENCOUNTER — Inpatient Hospital Stay (HOSPITAL_COMMUNITY): Payer: Medicare HMO

## 2017-06-22 DIAGNOSIS — Z7984 Long term (current) use of oral hypoglycemic drugs: Secondary | ICD-10-CM

## 2017-06-22 DIAGNOSIS — Z888 Allergy status to other drugs, medicaments and biological substances status: Secondary | ICD-10-CM

## 2017-06-22 DIAGNOSIS — I5033 Acute on chronic diastolic (congestive) heart failure: Secondary | ICD-10-CM | POA: Diagnosis not present

## 2017-06-22 DIAGNOSIS — I481 Persistent atrial fibrillation: Secondary | ICD-10-CM | POA: Diagnosis present

## 2017-06-22 DIAGNOSIS — R0602 Shortness of breath: Secondary | ICD-10-CM | POA: Diagnosis not present

## 2017-06-22 DIAGNOSIS — I959 Hypotension, unspecified: Secondary | ICD-10-CM | POA: Diagnosis present

## 2017-06-22 DIAGNOSIS — I509 Heart failure, unspecified: Secondary | ICD-10-CM | POA: Diagnosis not present

## 2017-06-22 DIAGNOSIS — I350 Nonrheumatic aortic (valve) stenosis: Secondary | ICD-10-CM

## 2017-06-22 DIAGNOSIS — Z79899 Other long term (current) drug therapy: Secondary | ICD-10-CM

## 2017-06-22 DIAGNOSIS — E785 Hyperlipidemia, unspecified: Secondary | ICD-10-CM | POA: Diagnosis present

## 2017-06-22 DIAGNOSIS — I5043 Acute on chronic combined systolic (congestive) and diastolic (congestive) heart failure: Secondary | ICD-10-CM | POA: Diagnosis not present

## 2017-06-22 DIAGNOSIS — I441 Atrioventricular block, second degree: Secondary | ICD-10-CM | POA: Diagnosis present

## 2017-06-22 DIAGNOSIS — E1142 Type 2 diabetes mellitus with diabetic polyneuropathy: Secondary | ICD-10-CM | POA: Diagnosis present

## 2017-06-22 DIAGNOSIS — I482 Chronic atrial fibrillation: Secondary | ICD-10-CM | POA: Diagnosis present

## 2017-06-22 DIAGNOSIS — R609 Edema, unspecified: Secondary | ICD-10-CM

## 2017-06-22 DIAGNOSIS — F419 Anxiety disorder, unspecified: Secondary | ICD-10-CM | POA: Diagnosis present

## 2017-06-22 DIAGNOSIS — Z6841 Body Mass Index (BMI) 40.0 and over, adult: Secondary | ICD-10-CM | POA: Diagnosis not present

## 2017-06-22 DIAGNOSIS — I248 Other forms of acute ischemic heart disease: Secondary | ICD-10-CM | POA: Diagnosis not present

## 2017-06-22 DIAGNOSIS — I447 Left bundle-branch block, unspecified: Secondary | ICD-10-CM | POA: Diagnosis present

## 2017-06-22 DIAGNOSIS — Z807 Family history of other malignant neoplasms of lymphoid, hematopoietic and related tissues: Secondary | ICD-10-CM

## 2017-06-22 DIAGNOSIS — Z66 Do not resuscitate: Secondary | ICD-10-CM | POA: Diagnosis present

## 2017-06-22 DIAGNOSIS — I10 Essential (primary) hypertension: Secondary | ICD-10-CM | POA: Diagnosis present

## 2017-06-22 DIAGNOSIS — Z833 Family history of diabetes mellitus: Secondary | ICD-10-CM

## 2017-06-22 DIAGNOSIS — I480 Paroxysmal atrial fibrillation: Secondary | ICD-10-CM | POA: Diagnosis not present

## 2017-06-22 DIAGNOSIS — I484 Atypical atrial flutter: Secondary | ICD-10-CM | POA: Diagnosis present

## 2017-06-22 DIAGNOSIS — Z86711 Personal history of pulmonary embolism: Secondary | ICD-10-CM

## 2017-06-22 DIAGNOSIS — I5032 Chronic diastolic (congestive) heart failure: Secondary | ICD-10-CM

## 2017-06-22 DIAGNOSIS — E039 Hypothyroidism, unspecified: Secondary | ICD-10-CM | POA: Diagnosis not present

## 2017-06-22 DIAGNOSIS — I4891 Unspecified atrial fibrillation: Secondary | ICD-10-CM | POA: Diagnosis not present

## 2017-06-22 DIAGNOSIS — K219 Gastro-esophageal reflux disease without esophagitis: Secondary | ICD-10-CM | POA: Diagnosis present

## 2017-06-22 DIAGNOSIS — M1990 Unspecified osteoarthritis, unspecified site: Secondary | ICD-10-CM | POA: Diagnosis present

## 2017-06-22 DIAGNOSIS — I110 Hypertensive heart disease with heart failure: Principal | ICD-10-CM | POA: Diagnosis present

## 2017-06-22 DIAGNOSIS — F411 Generalized anxiety disorder: Secondary | ICD-10-CM

## 2017-06-22 DIAGNOSIS — Z8249 Family history of ischemic heart disease and other diseases of the circulatory system: Secondary | ICD-10-CM

## 2017-06-22 DIAGNOSIS — E118 Type 2 diabetes mellitus with unspecified complications: Secondary | ICD-10-CM | POA: Diagnosis not present

## 2017-06-22 DIAGNOSIS — Z9049 Acquired absence of other specified parts of digestive tract: Secondary | ICD-10-CM

## 2017-06-22 DIAGNOSIS — Z9849 Cataract extraction status, unspecified eye: Secondary | ICD-10-CM | POA: Diagnosis not present

## 2017-06-22 DIAGNOSIS — Z86718 Personal history of other venous thrombosis and embolism: Secondary | ICD-10-CM

## 2017-06-22 DIAGNOSIS — Z79890 Hormone replacement therapy (postmenopausal): Secondary | ICD-10-CM | POA: Diagnosis not present

## 2017-06-22 DIAGNOSIS — I361 Nonrheumatic tricuspid (valve) insufficiency: Secondary | ICD-10-CM | POA: Diagnosis not present

## 2017-06-22 DIAGNOSIS — Z950 Presence of cardiac pacemaker: Secondary | ICD-10-CM

## 2017-06-22 LAB — COMPREHENSIVE METABOLIC PANEL
ALK PHOS: 65 U/L (ref 38–126)
ALT: 15 U/L (ref 14–54)
AST: 24 U/L (ref 15–41)
Albumin: 3.4 g/dL — ABNORMAL LOW (ref 3.5–5.0)
Anion gap: 13 (ref 5–15)
BILIRUBIN TOTAL: 1.1 mg/dL (ref 0.3–1.2)
BUN: 15 mg/dL (ref 6–20)
CALCIUM: 9 mg/dL (ref 8.9–10.3)
CO2: 21 mmol/L — ABNORMAL LOW (ref 22–32)
CREATININE: 0.89 mg/dL (ref 0.44–1.00)
Chloride: 100 mmol/L — ABNORMAL LOW (ref 101–111)
GFR, EST NON AFRICAN AMERICAN: 59 mL/min — AB (ref >60)
Glucose, Bld: 173 mg/dL — ABNORMAL HIGH (ref 65–99)
Potassium: 3.9 mmol/L (ref 3.5–5.1)
Sodium: 134 mmol/L — ABNORMAL LOW (ref 135–145)
TOTAL PROTEIN: 7.6 g/dL (ref 6.5–8.1)

## 2017-06-22 LAB — HEMOGLOBIN A1C
HEMOGLOBIN A1C: 6.4 % — AB (ref 4.8–5.6)
MEAN PLASMA GLUCOSE: 136.98 mg/dL

## 2017-06-22 LAB — TROPONIN I
TROPONIN I: 0.1 ng/mL — AB (ref <0.03)
TROPONIN I: 0.15 ng/mL — AB (ref <0.03)
Troponin I: 0.13 ng/mL (ref <0.03)

## 2017-06-22 LAB — CBC
HCT: 38.2 % (ref 36.0–46.0)
Hemoglobin: 12.7 g/dL (ref 12.0–15.0)
MCH: 31.5 pg (ref 26.0–34.0)
MCHC: 33.2 g/dL (ref 30.0–36.0)
MCV: 94.8 fL (ref 78.0–100.0)
PLATELETS: 283 10*3/uL (ref 150–400)
RBC: 4.03 MIL/uL (ref 3.87–5.11)
RDW: 14.3 % (ref 11.5–15.5)
WBC: 9.1 10*3/uL (ref 4.0–10.5)

## 2017-06-22 LAB — CBC WITH DIFFERENTIAL/PLATELET
Basophils Absolute: 0 10*3/uL (ref 0.0–0.1)
Basophils Relative: 0 %
Eosinophils Absolute: 0.1 10*3/uL (ref 0.0–0.7)
Eosinophils Relative: 1 %
HCT: 38.9 % (ref 36.0–46.0)
HEMOGLOBIN: 12.6 g/dL (ref 12.0–15.0)
LYMPHS ABS: 1.3 10*3/uL (ref 0.7–4.0)
LYMPHS PCT: 14 %
MCH: 30.6 pg (ref 26.0–34.0)
MCHC: 32.4 g/dL (ref 30.0–36.0)
MCV: 94.4 fL (ref 78.0–100.0)
MONOS PCT: 8 %
Monocytes Absolute: 0.7 10*3/uL (ref 0.1–1.0)
NEUTROS PCT: 77 %
Neutro Abs: 7.1 10*3/uL (ref 1.7–7.7)
Platelets: 277 10*3/uL (ref 150–400)
RBC: 4.12 MIL/uL (ref 3.87–5.11)
RDW: 14.3 % (ref 11.5–15.5)
WBC: 9.1 10*3/uL (ref 4.0–10.5)

## 2017-06-22 LAB — CREATININE, SERUM
CREATININE: 0.94 mg/dL (ref 0.44–1.00)
GFR, EST NON AFRICAN AMERICAN: 55 mL/min — AB (ref >60)

## 2017-06-22 LAB — URINALYSIS, ROUTINE W REFLEX MICROSCOPIC
Bacteria, UA: NONE SEEN
Bilirubin Urine: NEGATIVE
Glucose, UA: NEGATIVE mg/dL
HGB URINE DIPSTICK: NEGATIVE
Ketones, ur: NEGATIVE mg/dL
NITRITE: NEGATIVE
PH: 6 (ref 5.0–8.0)
Protein, ur: NEGATIVE mg/dL
SPECIFIC GRAVITY, URINE: 1.006 (ref 1.005–1.030)

## 2017-06-22 LAB — BRAIN NATRIURETIC PEPTIDE
B NATRIURETIC PEPTIDE 5: 291.6 pg/mL — AB (ref 0.0–100.0)
B NATRIURETIC PEPTIDE 5: 360.3 pg/mL — AB (ref 0.0–100.0)

## 2017-06-22 LAB — CALCIUM: CALCIUM: 8.9 mg/dL (ref 8.9–10.3)

## 2017-06-22 LAB — MAGNESIUM: Magnesium: 2.2 mg/dL (ref 1.7–2.4)

## 2017-06-22 LAB — PHOSPHORUS: Phosphorus: 2.9 mg/dL (ref 2.5–4.6)

## 2017-06-22 LAB — TSH: TSH: 2.771 u[IU]/mL (ref 0.350–4.500)

## 2017-06-22 LAB — GLUCOSE, CAPILLARY
GLUCOSE-CAPILLARY: 113 mg/dL — AB (ref 65–99)
Glucose-Capillary: 144 mg/dL — ABNORMAL HIGH (ref 65–99)

## 2017-06-22 MED ORDER — OCUVITE PRESERVISION PO TABS: 1.0000 | ORAL_TABLET | Freq: Two times a day (BID) | ORAL | Status: DC

## 2017-06-22 MED ORDER — VENLAFAXINE HCL ER 75 MG PO CP24
75.0000 mg | ORAL_CAPSULE | Freq: Every day | ORAL | Status: DC
  Administered 2017-06-23 – 2017-06-26 (×4): 75 mg via ORAL
  Filled 2017-06-22 (×5): qty 1

## 2017-06-22 MED ORDER — DOCUSATE SODIUM 100 MG PO CAPS
100.0000 mg | ORAL_CAPSULE | Freq: Two times a day (BID) | ORAL | Status: DC
  Administered 2017-06-22 – 2017-06-25 (×5): 100 mg via ORAL
  Filled 2017-06-22 (×8): qty 1

## 2017-06-22 MED ORDER — INSULIN ASPART 100 UNIT/ML ~~LOC~~ SOLN
0.0000 [IU] | Freq: Three times a day (TID) | SUBCUTANEOUS | Status: DC
  Administered 2017-06-22 – 2017-06-23 (×2): 1 [IU] via SUBCUTANEOUS
  Administered 2017-06-23: 3 [IU] via SUBCUTANEOUS
  Administered 2017-06-23: 1 [IU] via SUBCUTANEOUS
  Administered 2017-06-24: 2 [IU] via SUBCUTANEOUS
  Administered 2017-06-24: 3 [IU] via SUBCUTANEOUS
  Administered 2017-06-24 – 2017-06-25 (×2): 2 [IU] via SUBCUTANEOUS
  Administered 2017-06-25: 3 [IU] via SUBCUTANEOUS
  Administered 2017-06-26: 2 [IU] via SUBCUTANEOUS
  Administered 2017-06-26: 1 [IU] via SUBCUTANEOUS

## 2017-06-22 MED ORDER — SENNOSIDES-DOCUSATE SODIUM 8.6-50 MG PO TABS: 1.0000 | ORAL_TABLET | Freq: Every evening | ORAL | Status: DC | PRN

## 2017-06-22 MED ORDER — FUROSEMIDE 10 MG/ML IJ SOLN
40.0000 mg | Freq: Once | INTRAMUSCULAR | Status: AC
  Administered 2017-06-22: 40 mg via INTRAVENOUS
  Filled 2017-06-22: qty 4

## 2017-06-22 MED ORDER — ALLOPURINOL 100 MG PO TABS
100.0000 mg | ORAL_TABLET | Freq: Every day | ORAL | Status: DC
  Administered 2017-06-23 – 2017-06-26 (×4): 100 mg via ORAL
  Filled 2017-06-22 (×5): qty 1

## 2017-06-22 MED ORDER — INSULIN ASPART 100 UNIT/ML ~~LOC~~ SOLN: 0.0000 [IU] | Freq: Every day | SUBCUTANEOUS | Status: DC

## 2017-06-22 MED ORDER — ONDANSETRON HCL 4 MG PO TABS: 4.0000 mg | ORAL_TABLET | Freq: Four times a day (QID) | ORAL | Status: DC | PRN

## 2017-06-22 MED ORDER — LEVOTHYROXINE SODIUM 75 MCG PO TABS
75.0000 ug | ORAL_TABLET | Freq: Every day | ORAL | Status: DC
  Administered 2017-06-23 – 2017-06-26 (×4): 75 ug via ORAL
  Filled 2017-06-22 (×5): qty 1

## 2017-06-22 MED ORDER — APIXABAN 5 MG PO TABS
5.0000 mg | ORAL_TABLET | Freq: Two times a day (BID) | ORAL | Status: DC
  Administered 2017-06-22 – 2017-06-24 (×5): 5 mg via ORAL
  Filled 2017-06-22 (×7): qty 1

## 2017-06-22 MED ORDER — GLIMEPIRIDE 1 MG PO TABS
1.0000 mg | ORAL_TABLET | Freq: Two times a day (BID) | ORAL | Status: DC
  Administered 2017-06-23 (×2): 1 mg via ORAL
  Filled 2017-06-22 (×3): qty 1

## 2017-06-22 MED ORDER — B COMPLEX-C PO TABS
1.0000 | ORAL_TABLET | Freq: Every day | ORAL | Status: DC
  Administered 2017-06-22 – 2017-06-26 (×5): 1 via ORAL
  Filled 2017-06-22 (×5): qty 1

## 2017-06-22 MED ORDER — POTASSIUM CHLORIDE CRYS ER 20 MEQ PO TBCR
20.0000 meq | EXTENDED_RELEASE_TABLET | Freq: Every day | ORAL | Status: DC
  Administered 2017-06-22 – 2017-06-24 (×3): 20 meq via ORAL
  Filled 2017-06-22 (×3): qty 1

## 2017-06-22 MED ORDER — ONDANSETRON HCL 4 MG/2ML IJ SOLN: 4.0000 mg | Freq: Four times a day (QID) | INTRAMUSCULAR | Status: DC | PRN

## 2017-06-22 MED ORDER — HYDROCODONE-ACETAMINOPHEN 5-325 MG PO TABS: 1.0000 | ORAL_TABLET | Freq: Three times a day (TID) | ORAL | Status: DC | PRN

## 2017-06-22 MED ORDER — ACETAMINOPHEN 650 MG RE SUPP: 650.0000 mg | Freq: Four times a day (QID) | RECTAL | Status: DC | PRN

## 2017-06-22 MED ORDER — MAGNESIUM CITRATE PO SOLN: 1.0000 | Freq: Once | ORAL | Status: DC | PRN

## 2017-06-22 MED ORDER — ACETAMINOPHEN 325 MG PO TABS: 650.0000 mg | ORAL_TABLET | Freq: Four times a day (QID) | ORAL | Status: DC | PRN

## 2017-06-22 MED ORDER — ADULT MULTIVITAMIN W/MINERALS CH
1.0000 | ORAL_TABLET | Freq: Every day | ORAL | Status: DC
  Administered 2017-06-22 – 2017-06-26 (×6): 1 via ORAL
  Filled 2017-06-22 (×5): qty 1

## 2017-06-22 MED ORDER — AMIODARONE HCL 200 MG PO TABS: 200.0000 mg | ORAL_TABLET | Freq: Two times a day (BID) | ORAL | Status: DC

## 2017-06-22 MED ORDER — DILTIAZEM HCL ER COATED BEADS 180 MG PO CP24
180.0000 mg | ORAL_CAPSULE | Freq: Every day | ORAL | Status: DC
  Administered 2017-06-22: 180 mg via ORAL
  Filled 2017-06-22 (×2): qty 1

## 2017-06-22 MED ORDER — FUROSEMIDE 10 MG/ML IJ SOLN
40.0000 mg | Freq: Two times a day (BID) | INTRAMUSCULAR | Status: DC
  Administered 2017-06-22 – 2017-06-23 (×2): 40 mg via INTRAVENOUS
  Filled 2017-06-22 (×2): qty 4

## 2017-06-22 MED ORDER — METOPROLOL TARTRATE 12.5 MG HALF TABLET
12.5000 mg | ORAL_TABLET | Freq: Two times a day (BID) | ORAL | Status: DC
  Administered 2017-06-23: 12.5 mg via ORAL
  Filled 2017-06-22: qty 1

## 2017-06-22 MED ORDER — ALBUTEROL SULFATE (2.5 MG/3ML) 0.083% IN NEBU: 2.5000 mg | INHALATION_SOLUTION | RESPIRATORY_TRACT | Status: DC | PRN

## 2017-06-22 MED ORDER — GLIMEPIRIDE 1 MG PO TABS
1.0000 mg | ORAL_TABLET | Freq: Two times a day (BID) | ORAL | Status: DC
  Filled 2017-06-22: qty 1

## 2017-06-22 MED ORDER — LEVALBUTEROL HCL 0.63 MG/3ML IN NEBU: 0.6300 mg | INHALATION_SOLUTION | Freq: Four times a day (QID) | RESPIRATORY_TRACT | Status: DC | PRN

## 2017-06-22 MED ORDER — SORBITOL 70 % SOLN
30.0000 mL | Freq: Every day | Status: DC | PRN
  Filled 2017-06-22: qty 30

## 2017-06-22 MED ORDER — VITAMIN D 1000 UNITS PO TABS
1000.0000 [IU] | ORAL_TABLET | Freq: Every day | ORAL | Status: DC
Start: 2017-06-22 — End: 2017-06-26
  Administered 2017-06-22 – 2017-06-26 (×5): 1000 [IU] via ORAL
  Filled 2017-06-22 (×5): qty 1

## 2017-06-22 MED ORDER — PROSIGHT PO TABS
1.0000 | ORAL_TABLET | Freq: Two times a day (BID) | ORAL | Status: DC
  Administered 2017-06-22 – 2017-06-26 (×8): 1 via ORAL
  Filled 2017-06-22 (×8): qty 1

## 2017-06-22 MED ORDER — B COMPLEX PO TABS: 1.0000 | ORAL_TABLET | Freq: Every day | ORAL | Status: DC

## 2017-06-22 MED ORDER — CENTRUM PO CHEW: 1.0000 | CHEWABLE_TABLET | Freq: Every day | ORAL | Status: DC

## 2017-06-22 MED ORDER — LORAZEPAM 0.5 MG PO TABS
0.5000 mg | ORAL_TABLET | Freq: Two times a day (BID) | ORAL | Status: DC
  Administered 2017-06-22 – 2017-06-25 (×6): 0.5 mg via ORAL
  Filled 2017-06-22 (×6): qty 1

## 2017-06-22 MED ORDER — TRAZODONE HCL 50 MG PO TABS: 50.0000 mg | ORAL_TABLET | Freq: Every evening | ORAL | Status: DC | PRN

## 2017-06-22 MED ORDER — NAPHAZOLINE-GLYCERIN 0.012-0.2 % OP SOLN: 1.0000 [drp] | Freq: Four times a day (QID) | OPHTHALMIC | Status: DC | PRN

## 2017-06-22 MED ORDER — OCUVITE-LUTEIN PO CAPS
1.0000 | ORAL_CAPSULE | Freq: Two times a day (BID) | ORAL | Status: DC
  Filled 2017-06-22: qty 1
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
## 2017-06-22 NOTE — ED Triage Notes (Signed)
Pt in from home via [facility] with c/o increasing sob x 1 wk. Hx of afib and CHF. Sats 92% on RA, denies fevers, cough or recent illness. Reports some increased swelling. BP 112/70

## 2017-06-22 NOTE — Progress Notes (Signed)
New pt admission from ED. Pt brought to the floor in stable condition. Vitals taken. Initial Assessment done. All immediate pertinent needs to patient addressed. Patient Guide given to patient. Important safety instructions relating to hospitalization reviewed with patient. Patient verbalized understanding. Will continue to monitor pt.  Alexandrina Fiorini, RN 
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
## 2017-06-22 NOTE — Clinical Social Work Note (Signed)
Clinical Social Work Assessment  Patient Details  Name: Jessica [name] MRN: [id number] Date of Birth: 06/18/1933  Date of referral:  06/22/17               Reason for consult:  Discharge Planning                Permission sought to share information with:  Case Manager Permission granted to share information::  Yes, Verbal Permission Granted  Name::        Agency::     Relationship::     Contact Information:     Housing/Transportation Living arrangements for the past 2 months:  Single Family Home Source of Information:  Patient Patient Interpreter Needed:  None Criminal Activity/Legal Involvement Pertinent to Current Situation/Hospitalization:    Significant Relationships:  Adult Children, Other Family Members Lives with:  Siblings Do you feel safe going back to the place where you live?  Yes Need for family participation in patient care:  Yes (Comment)  Care giving concerns:  CSW consulted for discharge planning.     Social Worker assessment / plan:  CSW met with pt in pt's room. [name] was also in the room. Pt stated she does not want SNF placement. Pt wants to go home at time of discharge. Pt stated she would be open to home health with [facility] if that is determined to be needed at discharge. Pt has a wheelchair, cane, and rolling walker at home. Pt informed CSW that she does not use any of those because she doesn't feel she needs it.   Employment status:  Retired Nurse, adult PT Recommendations:  Not assessed at this time Information / Referral to community resources:     Patient/Family's Response to care:  Pt is agreeable to current plan of care.    Patient/Family's Understanding of and Emotional Response to Diagnosis, Current Treatment, and Prognosis:  Pt did not have any questions or concerns for this CSW.   Emotional Assessment Appearance:  Appears stated age Attitude/Demeanor/Rapport:    Affect (typically observed):   Accepting, Adaptable, Calm, Pleasant Orientation:  Oriented to Self, Oriented to Place, Oriented to  Time, Oriented to Situation Alcohol / Substance use:    Psych involvement (Current and /or in the community):     Discharge Needs  Concerns to be addressed:  Denies Needs/Concerns at this time Readmission within the last 30 days:    Current discharge risk:  None Barriers to Discharge:  Continued Medical Work up   [employer], LCSW 06/22/2017, 4:01 PM
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
## 2017-06-23 ENCOUNTER — Inpatient Hospital Stay (HOSPITAL_COMMUNITY): Payer: Medicare HMO

## 2017-06-23 ENCOUNTER — Other Ambulatory Visit: Payer: Self-pay

## 2017-06-23 DIAGNOSIS — I5033 Acute on chronic diastolic (congestive) heart failure: Secondary | ICD-10-CM

## 2017-06-23 DIAGNOSIS — I4891 Unspecified atrial fibrillation: Secondary | ICD-10-CM

## 2017-06-23 DIAGNOSIS — I350 Nonrheumatic aortic (valve) stenosis: Secondary | ICD-10-CM

## 2017-06-23 DIAGNOSIS — I480 Paroxysmal atrial fibrillation: Secondary | ICD-10-CM

## 2017-06-23 DIAGNOSIS — E118 Type 2 diabetes mellitus with unspecified complications: Secondary | ICD-10-CM

## 2017-06-23 DIAGNOSIS — E039 Hypothyroidism, unspecified: Secondary | ICD-10-CM

## 2017-06-23 DIAGNOSIS — I10 Essential (primary) hypertension: Secondary | ICD-10-CM

## 2017-06-23 LAB — GLUCOSE, CAPILLARY
GLUCOSE-CAPILLARY: 154 mg/dL — AB (ref 65–99)
GLUCOSE-CAPILLARY: 125 mg/dL — AB (ref 65–99)
Glucose-Capillary: 146 mg/dL — ABNORMAL HIGH (ref 65–99)
Glucose-Capillary: 223 mg/dL — ABNORMAL HIGH (ref 65–99)
Glucose-Capillary: 134 mg/dL — ABNORMAL HIGH (ref 65–99)

## 2017-06-23 LAB — BASIC METABOLIC PANEL
ANION GAP: 13 (ref 5–15)
BUN: 20 mg/dL (ref 6–20)
CHLORIDE: 98 mmol/L — AB (ref 101–111)
CO2: 22 mmol/L (ref 22–32)
Calcium: 8.7 mg/dL — ABNORMAL LOW (ref 8.9–10.3)
Creatinine, Ser: 1.08 mg/dL — ABNORMAL HIGH (ref 0.44–1.00)
GFR calc non Af Amer: 47 mL/min — ABNORMAL LOW (ref >60)
GFR, EST AFRICAN AMERICAN: 54 mL/min — AB (ref >60)
Glucose, Bld: 299 mg/dL — ABNORMAL HIGH (ref 65–99)
POTASSIUM: 3.5 mmol/L (ref 3.5–5.1)
Sodium: 133 mmol/L — ABNORMAL LOW (ref 135–145)

## 2017-06-23 LAB — APTT: APTT: 41 s — AB (ref 24–36)

## 2017-06-23 LAB — CBC
HEMATOCRIT: 37.4 % (ref 36.0–46.0)
HEMOGLOBIN: 12.3 g/dL (ref 12.0–15.0)
MCH: 31.2 pg (ref 26.0–34.0)
MCHC: 32.9 g/dL (ref 30.0–36.0)
MCV: 94.9 fL (ref 78.0–100.0)
Platelets: 301 10*3/uL (ref 150–400)
RBC: 3.94 MIL/uL (ref 3.87–5.11)
RDW: 14.6 % (ref 11.5–15.5)
WBC: 8.8 10*3/uL (ref 4.0–10.5)

## 2017-06-23 LAB — PROTIME-INR
INR: 1.68
PROTHROMBIN TIME: 19.6 s — AB (ref 11.4–15.2)

## 2017-06-23 LAB — BRAIN NATRIURETIC PEPTIDE: B NATRIURETIC PEPTIDE 5: 335 pg/mL — AB (ref 0.0–100.0)

## 2017-06-23 LAB — MAGNESIUM: Magnesium: 2.1 mg/dL (ref 1.7–2.4)

## 2017-06-23 LAB — TROPONIN I: TROPONIN I: 0.12 ng/mL — AB (ref <0.03)

## 2017-06-23 MED ORDER — FUROSEMIDE 10 MG/ML IJ SOLN
40.0000 mg | Freq: Four times a day (QID) | INTRAMUSCULAR | Status: DC
  Administered 2017-06-23 – 2017-06-25 (×7): 40 mg via INTRAVENOUS
  Filled 2017-06-23 (×7): qty 4

## 2017-06-23 MED ORDER — TEMAZEPAM 15 MG PO CAPS
15.0000 mg | ORAL_CAPSULE | Freq: Every evening | ORAL | Status: DC | PRN
  Administered 2017-06-25: 15 mg via ORAL
  Filled 2017-06-23: qty 1

## 2017-06-23 MED ORDER — METOPROLOL TARTRATE 25 MG PO TABS
25.0000 mg | ORAL_TABLET | Freq: Two times a day (BID) | ORAL | Status: DC
  Administered 2017-06-23 – 2017-06-25 (×4): 25 mg via ORAL
  Filled 2017-06-23 (×4): qty 1

## 2017-06-23 NOTE — Progress Notes (Signed)
Attempted echo at 10:00 am. HR in 130s. Unable to accurately assess heart function. Will attempt again.

## 2017-06-23 NOTE — Progress Notes (Signed)
Pt is stable throughout the day, vitals stable, pt ambulated in a hallway without distress and chest pain, weaned her oxygen off and oxygen saturation  is 95% in RA. Pt is sitting in a recliner and enjoying her visitor this time  Will continue to monitor  Palma Holter, RN

## 2017-06-23 NOTE — Care Management Note (Signed)
Case Management Note  Patient Details  Name: Jessica [name] MRN: [id number] Date of Birth: 1933-07-20  Subjective/Objective:   Dyspnea               Action/Plan:  Patient lives at home with her twin sister/ her caregiver;her sister has dementia; Primary Care Provider: [name], MD; has private insurance with [facility] Medicare with prescription drug coverage; she use the mail order pharmacy; DME - wheelchair, rolling walker and cane, she use; patient is requesting [facility] if [facility] services is needed; CM will continue to follow for progression of care.  Expected Discharge Date:    possibly 06/27/2017              Expected Discharge Plan:  Home/Self Care  Discharge planning Services  CM Consult  Status of Service:  In process, will continue to follow  Sherrilyn Rist [phone] 06/23/2017, 10:56 AM

## 2017-06-23 NOTE — Evaluation (Signed)
Physical Therapy Evaluation Patient Details Name: Jessica [name] MRN: [id number] DOB: 12/09/1933 Today's Date: 06/23/2017   History of Present Illness  Pt is an 84 y.o. female admitted 06/22/17 with SOB and edema; worked up for CHF exacerbation. Elevated troponin likely due to ischemic demand from CHF. PMH includes pacemaker, DM, HTN, PE/DVT, a-fib, obesity, peripheral neuropathy, osteoarthritis.     Clinical Impression  Pt presents with an overall decrease in functional mobility secondary to above. PTA, pt mod indep with intermittent use of SPC; lives with sister who has dementia. Today, pt able to amb throughout room with intermittent UE support on furniture; amb an additional 100' in hallway with RW and supervision for safety. SpO2 >90% on RA. HR up to 147 upon returning to room, quickly returning to 68-73 with seated rest. BP 95/79. Pt would benefit from continued acute PT services to maximize functional mobility and independence prior to d/c home.     Follow Up Recommendations No PT follow up;Supervision - Intermittent    Equipment Recommendations  None recommended by PT    Recommendations for Other Services       Precautions / Restrictions Precautions Precautions: Fall Restrictions Weight Bearing Restrictions: No      Mobility  Bed Mobility Overal bed mobility: Independent                Transfers Overall transfer level: Independent Equipment used: None                Ambulation/Gait Ambulation/Gait assistance: Supervision Ambulation Distance (Feet): 100 Feet Assistive device: None;Rolling walker (2 wheeled) Gait Pattern/deviations: Step-through pattern;Decreased stride length Gait velocity: Decreased Gait velocity interpretation: <1.8 ft/sec, indicative of risk for recurrent falls General Gait Details: Amb in room with no DME and min guard for balance; amb an additional 100' with RW and supervision, stability much improved. SpO2 >90% on RA  throughout  Stairs            Wheelchair Mobility    Modified Rankin (Stroke Patients Only)       Balance Overall balance assessment: Needs assistance   Sitting balance-Leahy Scale: Good       Standing balance-Leahy Scale: Fair                               Pertinent Vitals/Pain Pain Assessment: No/denies pain    Home Living Family/patient expects to be discharged to:: Private residence Living Arrangements: Other relatives(Sister) Available Help at Discharge: Family;Available PRN/intermittently Type of Home: House Home Access: Stairs to enter Entrance Stairs-Rails: Left Entrance Stairs-Number of Steps: 3 Home Layout: One level Home Equipment: Walker - 2 wheels;Cane - single point      Prior Function Level of Independence: Independent         Comments: Intermittent use of SPC      Hand Dominance        Extremity/Trunk Assessment   Upper Extremity Assessment Upper Extremity Assessment: Overall WFL for tasks assessed    Lower Extremity Assessment Lower Extremity Assessment: Overall WFL for tasks assessed       Communication   Communication: No difficulties  Cognition Arousal/Alertness: Awake/alert Behavior During Therapy: WFL for tasks assessed/performed Overall Cognitive Status: Within Functional Limits for tasks assessed  General Comments      Exercises     Assessment/Plan    PT Assessment Patient needs continued PT services  PT Problem List Decreased activity tolerance;Decreased balance;Decreased mobility       PT Treatment Interventions DME instruction;Gait training;Stair training;Functional mobility training;Therapeutic activities;Therapeutic exercise;Balance training;Patient/family education    PT Goals (Current goals can be found in the Care Plan section)  Acute Rehab PT Goals Patient Stated Goal: Return home PT Goal Formulation: With patient Time For Goal  Achievement: 07/07/17 Potential to Achieve Goals: Good    Frequency Min 3X/week   Barriers to discharge        Co-evaluation               AM-PAC PT "6 Clicks" Daily Activity  Outcome Measure Difficulty turning over in bed (including adjusting bedclothes, sheets and blankets)?: None Difficulty moving from lying on back to sitting on the side of the bed? : None Difficulty sitting down on and standing up from a chair with arms (e.g., wheelchair, bedside commode, etc,.)?: None Help needed moving to and from a bed to chair (including a wheelchair)?: A Little Help needed walking in hospital room?: A Little Help needed climbing 3-5 steps with a railing? : A Little 6 Click Score: 21    End of Session Equipment Utilized During Treatment: Gait belt Activity Tolerance: Patient tolerated treatment well;Patient limited by fatigue Patient left: in chair;with call bell/phone within reach;with family/visitor present Nurse Communication: Mobility status PT Visit Diagnosis: Other abnormalities of gait and mobility (R26.89)    Time: 8453-6468 PT Time Calculation (min) (ACUTE ONLY): 27 min   Charges:   PT Evaluation $PT Eval Moderate Complexity: 1 Mod PT Treatments $Therapeutic Activity: 8-22 mins   PT G Codes:       Mabeline Caras, PT, DPT Acute Rehab Services  Pager: [place] 06/23/2017, 4:55 PM

## 2017-06-23 NOTE — Plan of Care (Signed)
  Problem: Nutrition: Goal: Adequate nutrition will be maintained Outcome: Completed/Met   Problem: Coping: Goal: Level of anxiety will decrease Outcome: Completed/Met   Problem: Elimination: Goal: Will not experience complications related to bowel motility Outcome: Completed/Met   Problem: Pain Managment: Goal: General experience of comfort will improve Outcome: Completed/Met   

## 2017-06-23 NOTE — Progress Notes (Signed)
Progress Note  Patient Name: Jessica [name] Date of Encounter: 06/23/2017  Primary Cardiologist: Skeet Latch, MD   Subjective   She feels better but still significantly SOB while walking to the bathroom.  Inpatient Medications    Scheduled Meds: . allopurinol  100 mg Oral Daily  . apixaban  5 mg Oral BID  . B-complex with vitamin C  1 tablet Oral Daily  . cholecalciferol  1,000 Units Oral Daily  . docusate sodium  100 mg Oral BID  . furosemide  40 mg Intravenous BID  . glimepiride  1 mg Oral BID WC  . insulin aspart  0-5 Units Subcutaneous QHS  . insulin aspart  0-9 Units Subcutaneous TID WC  . levothyroxine  75 mcg Oral QAC breakfast  . LORazepam  0.5 mg Oral BID  . metoprolol tartrate  12.5 mg Oral BID  . multivitamin  1 tablet Oral BID  . multivitamin with minerals  1 tablet Oral Daily  . potassium chloride SA  20 mEq Oral Daily  . venlafaxine XR  75 mg Oral Q breakfast   Continuous Infusions:  PRN Meds: acetaminophen **OR** acetaminophen, albuterol, HYDROcodone-acetaminophen, levalbuterol, magnesium citrate, naphazoline-glycerin, ondansetron **OR** ondansetron (ZOFRAN) IV, senna-docusate, sorbitol, traZODone   Vital Signs    Vitals:   06/22/17 2022 06/23/17 0035 06/23/17 0537 06/23/17 0757  BP: 107/76 (!) 106/57 100/60 122/88  Pulse: (!) 113 (!) 113 (!) 127 69  Resp: 20 18 18 20   Temp: 97.9 F (36.6 C) 98.3 F (36.8 C) 97.6 F (36.4 C) 98.5 F (36.9 C)  TempSrc: Oral Oral Oral Oral  SpO2: 97% 94% 96% 95%  Weight:   251 lb 1.7 oz (113.9 kg)   Height:        Intake/Output Summary (Last 24 hours) at 06/23/2017 1100 Last data filed at 06/23/2017 0849 Gross per 24 hour  Intake 720 ml  Output 500 ml  Net 220 ml   Filed Weights   06/22/17 0908 06/22/17 1636 06/23/17 0537  Weight: 251 lb (113.9 kg) 251 lb 11.2 oz (114.2 kg) 251 lb 1.7 oz (113.9 kg)    Telemetry    A-fib with RVR- Personally Reviewed  Physical Exam   GEN: No acute distress.    Neck: No JVD Cardiac: RRR, no murmurs, rubs, or gallops.  Respiratory: Clear to auscultation bilaterally. GI: Soft, nontender, non-distended  MS: No edema; No deformity. Neuro:  Nonfocal  Psych: Normal affect   Labs    Chemistry Recent Labs  Lab 06/22/17 1012 06/22/17 1252 06/23/17 0948  NA 134*  --  133*  K 3.9  --  3.5  CL 100*  --  98*  CO2 21*  --  22  GLUCOSE 173*  --  299*  BUN 15  --  20  CREATININE 0.89 0.94 1.08*  CALCIUM 9.0 8.9 8.7*  PROT 7.6  --   --   ALBUMIN 3.4*  --   --   AST 24  --   --   ALT 15  --   --   ALKPHOS 65  --   --   BILITOT 1.1  --   --   GFRNONAA 59* 55* 47*  GFRAA >60 >60 54*  ANIONGAP 13  --  13     Hematology Recent Labs  Lab 06/22/17 1012 06/22/17 1252 06/23/17 0006  WBC 9.1 9.1 8.8  RBC 4.12 4.03 3.94  HGB 12.6 12.7 12.3  HCT 38.9 38.2 37.4  MCV 94.4 94.8 94.9  MCH  30.6 31.5 31.2  MCHC 32.4 33.2 32.9  RDW 14.3 14.3 14.6  PLT 277 283 301   Cardiac Enzymes Recent Labs  Lab 06/22/17 1012 06/22/17 1252 06/22/17 1840 06/23/17 0006  TROPONINI 0.10* 0.13* 0.15* 0.12*   No results for input(s): TROPIPOC in the last 168 hours.   BNP Recent Labs  Lab 06/22/17 1012 06/22/17 1252 06/23/17 0006  BNP 291.6* 360.3* 335.0*    DDimer No results for input(s): DDIMER in the last 168 hours.   Radiology    Dg Chest 2 View  Result Date: 06/22/2017 CLINICAL DATA:  Shortness of breath.  Atrial fibrillation EXAM: CHEST - 2 VIEW COMPARISON:  April 23, 2017 FINDINGS: There is moderate interstitial pulmonary edema throughout the lungs. There is patchy airspace opacity in the lung bases. There is no appreciable pleural effusion. There is cardiomegaly with pulmonary venous hypertension. Pacemaker leads are attached to the right atrium and right ventricle. There is a hiatal hernia. No adenopathy. There is degenerative change in the thoracic spine. IMPRESSION: Moderate interstitial edema. Patchy alveolar opacity in the bases may  represent alveolar edema, although there may be superimposed pneumonia in the bases. There is cardiomegaly with pulmonary venous hypertension. Pacemaker leads attached to right atrium and right ventricle. There is a focal hiatal hernia. Electronically Signed   By: Lowella Grip III M.D.   On: 06/22/2017 09:52   Cardiac Studies    Patient Profile     84 y.o. female   [place]    1. Dyspnea on exertion -Difficult to determine etiology.  Differential includes acute on chronic combined CHF, worsening of aortic stenosis, A. fib with RVR, ischemia versus pulmonary related. -Pending echocardiogram this admission  2.  Chroic atrial fibrillation with rapid ventricular rate -Rate elevated on presentation.  Her symptoms similar when she admitted with A. fib RVR during January 2019. -She did not like amiodarone the way it made her feel.  Worsened resting tremor since then. -Amiodarone hold this admission.  I will increase metoprolol to 25 mg po BID. Pending echo this admission.  TSH normal. -Continue Eliquis for anticoagulation. -We will check device to determine undermined rhythm and heart rate  3.  Acute on chronic combined CHF -BNP minimally elevated.  Likely due to elevated heart rate.  Continue Lasix. Increase to 40 mg iv Q6H. Crackles B/L, Strict I&O and daily weight.  She does not check her weight at home.  She needs to cut back on salt intake. -Patient will need heart failure education in detail during discharge.  4.  Elevated troponin -She denies any chest heaviness/pressure with dyspnea on exertion.  Continue cycle troponin.  5.  Status post Medtronic dual chamber pacemaker - Interrogate device.  6.  Diabetes - Per admitting team  7.  Hypertension - Blood pressure soft low.  Follow closely. Home triamterene-hydrochlorothiazide  37.5-25 MG tablet on hold.      For questions or updates, please contact [place] Please consult [email or web address] for contact  info under Cardiology/STEMI.      Signed, Ena Dawley, MD  06/23/2017, 11:00 AM
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
## 2017-06-23 NOTE — Progress Notes (Signed)
PROGRESS NOTE    Jessica [name]  AST:[id number] DOB: [birth year] DOA: 06/22/2017 PCP: [name], MD    Brief Narrative:  Jessica [name] is a 84 y.o. female with medical history significant of shortness of breath. This is a 85 year old female with extensive history of congestive heart failure, atrial fibrillation with pacemaker, hypertension, hyperlipidemia, anxiety, presented with progressive shortness of breath.  Especially with exertion.  Also worsening edema in her lower extremity, face and abdomen area.  She denied any chest pain.  She reported that she was compliant with her medication but she did not like her medication of amiodarone as she believes it makes her shortness of breath worse, and also gives her the shakes.  She denied of having any recent illnesses such as fever, chills, nausea, vomiting.  Denies any headaches visual change or asymmetric weaknesses.  Denied having any dysuria.  Denied of having any joint pain.  Denied any constipation or diarrhea.  Denied any rash.   ED Course:   Patient was hemodynamically stable in ED pulse 59, blood pressure was low on admission 84/67, has improved to 90/72.  Labs within normal limits, elevated troponin of 0.10, proBNP 291. Chest x-ray reviewed: Consistent with moderate interstitial edema, congestion, cardiomegaly Pacemaker leads are identified in the right atrium and right ventricleThere is cardiomegaly with pulmonary venous hypertension. Pacemaker There is a focal hiatal hernia. Patient has been placed on 2 L of oxygen, currently satting greater than 2%, 40 of Lasix was administered.      Assessment & Plan:   Principal Problem:   SOB (shortness of breath) Active Problems:   Hypothyroidism   DM type 2, controlled, with complication (HCC)   OBESITY, MORBID   Anxiety state   Essential hypertension   Edema   Paroxysmal atrial fibrillation (HCC)   CHF (congestive heart failure), NYHA class II, acute on  chronic, combined ([place])  #1 dyspnea/shortness of breath  likely multifactorial secondary to acute on chronic combined CHF exacerbation, moderate aortic stenosis and A. fib with RVR Patient with some clinical improvement since admission however still with shortness of breath on exertion.  Rate better controlled his dose of metoprolol per cardiology.  Amiodarone discontinued during this hospitalization as patient states it made her jittery and as such had been on compliant with it with worsening in her tremors.  Iliac enzymes with elevated troponins likely more demand ischemia.  2D echo done with results pending.  Patient with a urine output of 500 cc so far.  Continue Eliquis for anticoagulation.  Per cardiology.  2.  Acute on chronic combined CHF exacerbation/moderate aortic stenosis Patient with some clinical improvement however still significantly short of breath on exertion.  I/record 500 cc so far.  Current weight is 251 pounds.  IV Lasix has been increased to 40 mg every 6 hours per cardiology.  Continue metoprolol.  Strict I's and O's.  Daily weights.  3.  A. fib with RVR Heart rate improved with increased dose of Lopressor.  Amiodarone on hold as patient states worsens her resting tremors and semi-compliant with this.  TSH within normal limits.  Lopressor increased per cardiology for better rate control.  2D echo pending.  Pacemaker to be interrogated per cardiology.  Eliquis for anticoagulation.  Cardiology following and appreciate input and recommendations.  4.  Elevated troponin Likely secondary to demand ischemia secondary to CHF exacerbation.  Initial troponin seem to be slowly increasing however started to decrease now.  2D echo done and pending.  Continue  beta-blocker and diuretics.  Cardiology following follow.  5.  Diabetes mellitus 2 Discontinue Amaryl.  Continue sliding scale insulin.  Follow.  6.  Hypertension Blood pressure somewhat borderline.  Home regimen of Maxzide on hold.   Amiodarone on hold.  Patient on diuretics and metoprolol.  Follow for now.  7.  Hypothyroidism TSH within normal limits.  Continue Synthroid.  8.  Morbid obesity  9.  Anxiety Continue Effexor and as needed Xanax.   DVT prophylaxis: Eliquis Code Status: Full Family Communication: Updated patient at bedside. Disposition Plan: Home when clinically improved with resolution of acute CHF exacerbation and rate control of chronic A. fib.   Consultants:   Cardiology: Dr. Percival Spanish 06/22/2017  Procedures:   2D echo 06/23/2017  Chest x-ray 06/22/2017  Antimicrobials:  None    Subjective: Patient in bed trying to get out of bed to go use the bathroom.  Patient states shortness of breath improving since admission however worse on ambulation.  Patient denies any chest pain.  Patient noted to have borderline blood pressure this morning.  Objective: Vitals:   06/23/17 0035 06/23/17 0537 06/23/17 0757 06/23/17 1230  BP: (!) 106/57 100/60 122/88 (!) 89/67  Pulse: (!) 113 (!) 127 69 97  Resp: 18 18 20 20   Temp: 98.3 F (36.8 C) 97.6 F (36.4 C) 98.5 F (36.9 C) 97.9 F (36.6 C)  TempSrc: Oral Oral Oral Oral  SpO2: 94% 96% 95% 93%  Weight:  113.9 kg (251 lb 1.7 oz)    Height:        Intake/Output Summary (Last 24 hours) at 06/23/2017 1651 Last data filed at 06/23/2017 1600 Gross per 24 hour  Intake 1080 ml  Output 1600 ml  Net -520 ml   Filed Weights   06/22/17 0908 06/22/17 1636 06/23/17 0537  Weight: 113.9 kg (251 lb) 114.2 kg (251 lb 11.2 oz) 113.9 kg (251 lb 1.7 oz)    Examination:  General exam: Appears calm and comfortable  Respiratory system: Bibasilar crackles.  No wheezing, no rhonchi. Respiratory effort normal. Cardiovascular system: S1 & S2 heard, RRR. No JVD, murmurs, rubs, gallops or clicks.  2+ bilateral lower extremity edema. Gastrointestinal system: Abdomen is nondistended, soft and nontender. No organomegaly or masses felt. Normal bowel sounds  heard. Central nervous system: Alert and oriented. No focal neurological deficits. Extremities: Symmetric 5 x 5 power. Skin: No rashes, lesions or ulcers Psychiatry: Judgement and insight appear normal. Mood & affect appropriate.     Data Reviewed: I have personally reviewed following labs and imaging studies  CBC: Recent Labs  Lab 06/22/17 1012 06/22/17 1252 06/23/17 0006  WBC 9.1 9.1 8.8  NEUTROABS 7.1  --   --   HGB 12.6 12.7 12.3  HCT 38.9 38.2 37.4  MCV 94.4 94.8 94.9  PLT 277 283 734   Basic Metabolic Panel: Recent Labs  Lab 06/22/17 1012 06/22/17 1252 06/23/17 0948  NA 134*  --  133*  K 3.9  --  3.5  CL 100*  --  98*  CO2 21*  --  22  GLUCOSE 173*  --  299*  BUN 15  --  20  CREATININE 0.89 0.94 1.08*  CALCIUM 9.0 8.9 8.7*  MG  --  2.2 2.1  PHOS  --  2.9  --    GFR: Estimated Creatinine Clearance: 48.8 mL/min (A) (by C-G formula based on SCr of 1.08 mg/dL (H)). Liver Function Tests: Recent Labs  Lab 06/22/17 1012  AST 24  ALT 15  ALKPHOS 65  BILITOT 1.1  PROT 7.6  ALBUMIN 3.4*   No results for input(s): LIPASE, AMYLASE in the last 168 hours. No results for input(s): AMMONIA in the last 168 hours. Coagulation Profile: Recent Labs  Lab 06/23/17 0006  INR 1.68   Cardiac Enzymes: Recent Labs  Lab 06/22/17 1012 06/22/17 1252 06/22/17 1840 06/23/17 0006  TROPONINI 0.10* 0.13* 0.15* 0.12*   BNP (last 3 results) No results for input(s): PROBNP in the last 8760 hours. HbA1C: Recent Labs    06/22/17 1252  HGBA1C 6.4*   CBG: Recent Labs  Lab 06/22/17 1636 06/22/17 2124 06/23/17 0755 06/23/17 1130 06/23/17 1604  GLUCAP 144* 113* 146* 223* 134*   Lipid Profile: No results for input(s): CHOL, HDL, LDLCALC, TRIG, CHOLHDL, LDLDIRECT in the last 72 hours. Thyroid Function Tests: Recent Labs    06/22/17 1252  TSH 2.771   Anemia Panel: No results for input(s): VITAMINB12, FOLATE, FERRITIN, TIBC, IRON, RETICCTPCT in the last 72  hours. Sepsis Labs: No results for input(s): PROCALCITON, LATICACIDVEN in the last 168 hours.  No results found for this or any previous visit (from the past 240 hour(s)).       Radiology Studies: Dg Chest 2 View  Result Date: 06/22/2017 CLINICAL DATA:  Shortness of breath.  Atrial fibrillation EXAM: CHEST - 2 VIEW COMPARISON:  April 23, 2017 FINDINGS: There is moderate interstitial pulmonary edema throughout the lungs. There is patchy airspace opacity in the lung bases. There is no appreciable pleural effusion. There is cardiomegaly with pulmonary venous hypertension. Pacemaker leads are attached to the right atrium and right ventricle. There is a hiatal hernia. No adenopathy. There is degenerative change in the thoracic spine. IMPRESSION: Moderate interstitial edema. Patchy alveolar opacity in the bases may represent alveolar edema, although there may be superimposed pneumonia in the bases. There is cardiomegaly with pulmonary venous hypertension. Pacemaker leads attached to right atrium and right ventricle. There is a focal hiatal hernia. Electronically Signed   By: Lowella Grip III M.D.   On: 06/22/2017 09:52        Scheduled Meds: . allopurinol  100 mg Oral Daily  . apixaban  5 mg Oral BID  . B-complex with vitamin C  1 tablet Oral Daily  . cholecalciferol  1,000 Units Oral Daily  . docusate sodium  100 mg Oral BID  . furosemide  40 mg Intravenous Q6H  . glimepiride  1 mg Oral BID WC  . insulin aspart  0-5 Units Subcutaneous QHS  . insulin aspart  0-9 Units Subcutaneous TID WC  . levothyroxine  75 mcg Oral QAC breakfast  . LORazepam  0.5 mg Oral BID  . metoprolol tartrate  25 mg Oral BID  . multivitamin  1 tablet Oral BID  . multivitamin with minerals  1 tablet Oral Daily  . potassium chloride SA  20 mEq Oral Daily  . venlafaxine XR  75 mg Oral Q breakfast   Continuous Infusions:   LOS: 1 day    Time spent: 35 minutes    Irine Seal, MD [facility] Pager [phone]  If 7PM-7AM, please contact night-coverage [email or web address] Password [facility] 06/23/2017, 4:51 PM

## 2017-06-24 ENCOUNTER — Inpatient Hospital Stay (HOSPITAL_COMMUNITY): Payer: Medicare HMO

## 2017-06-24 DIAGNOSIS — I361 Nonrheumatic tricuspid (valve) insufficiency: Secondary | ICD-10-CM

## 2017-06-24 LAB — GLUCOSE, CAPILLARY
GLUCOSE-CAPILLARY: 168 mg/dL — AB (ref 65–99)
Glucose-Capillary: 231 mg/dL — ABNORMAL HIGH (ref 65–99)
Glucose-Capillary: 165 mg/dL — ABNORMAL HIGH (ref 65–99)
Glucose-Capillary: 127 mg/dL — ABNORMAL HIGH (ref 65–99)

## 2017-06-24 LAB — BASIC METABOLIC PANEL
Anion gap: 13 (ref 5–15)
BUN: 21 mg/dL — ABNORMAL HIGH (ref 6–20)
CO2: 26 mmol/L (ref 22–32)
Calcium: 8.9 mg/dL (ref 8.9–10.3)
Chloride: 98 mmol/L — ABNORMAL LOW (ref 101–111)
Creatinine, Ser: 0.9 mg/dL (ref 0.44–1.00)
GFR calc non Af Amer: 58 mL/min — ABNORMAL LOW (ref >60)
GLUCOSE: 149 mg/dL — AB (ref 65–99)
Potassium: 3.2 mmol/L — ABNORMAL LOW (ref 3.5–5.1)
Sodium: 137 mmol/L (ref 135–145)

## 2017-06-24 LAB — CBC
HCT: 39.2 % (ref 36.0–46.0)
Hemoglobin: 12.8 g/dL (ref 12.0–15.0)
MCH: 31.1 pg (ref 26.0–34.0)
MCHC: 32.7 g/dL (ref 30.0–36.0)
MCV: 95.4 fL (ref 78.0–100.0)
PLATELETS: 327 10*3/uL (ref 150–400)
RBC: 4.11 MIL/uL (ref 3.87–5.11)
RDW: 14.4 % (ref 11.5–15.5)
WBC: 8.2 10*3/uL (ref 4.0–10.5)

## 2017-06-24 LAB — URINE CULTURE: Culture: <10000 — AB

## 2017-06-24 LAB — ECHOCARDIOGRAM COMPLETE
Height: 62 in
WEIGHTICAEL: 4017.6 [oz_av]

## 2017-06-24 LAB — BRAIN NATRIURETIC PEPTIDE: B NATRIURETIC PEPTIDE 5: 436.1 pg/mL — AB (ref 0.0–100.0)

## 2017-06-24 MED ORDER — SODIUM CHLORIDE 0.9% FLUSH
3.0000 mL | Freq: Two times a day (BID) | INTRAVENOUS | Status: DC
  Administered 2017-06-24 – 2017-06-26 (×4): 3 mL via INTRAVENOUS

## 2017-06-24 MED ORDER — SODIUM CHLORIDE 0.9 % IV SOLN: 250.0000 mL | INTRAVENOUS | Status: DC | PRN

## 2017-06-24 MED ORDER — POTASSIUM CHLORIDE CRYS ER 20 MEQ PO TBCR
20.0000 meq | EXTENDED_RELEASE_TABLET | Freq: Two times a day (BID) | ORAL | Status: DC
  Administered 2017-06-24: 20 meq via ORAL
  Filled 2017-06-24: qty 1

## 2017-06-24 MED ORDER — POTASSIUM CHLORIDE CRYS ER 20 MEQ PO TBCR
40.0000 meq | EXTENDED_RELEASE_TABLET | Freq: Once | ORAL | Status: AC
Start: 2017-06-24 — End: 2017-06-24
  Administered 2017-06-24: 40 meq via ORAL
  Filled 2017-06-24: qty 2

## 2017-06-24 MED ORDER — PERFLUTREN LIPID MICROSPHERE
1.0000 mL | INTRAVENOUS | Status: AC | PRN
  Administered 2017-06-24: 2 mL via INTRAVENOUS
  Filled 2017-06-24: qty 10

## 2017-06-24 MED ORDER — SODIUM CHLORIDE 0.9% FLUSH: 3.0000 mL | INTRAVENOUS | Status: DC | PRN

## 2017-06-24 MED ORDER — ASPIRIN 81 MG PO CHEW
81.0000 mg | CHEWABLE_TABLET | ORAL | Status: AC
  Administered 2017-06-25: 81 mg via ORAL
  Filled 2017-06-24: qty 1

## 2017-06-24 MED ORDER — SODIUM CHLORIDE 0.9 % IV SOLN
INTRAVENOUS | Status: DC
  Administered 2017-06-25: 07:00:00 via INTRAVENOUS

## 2017-06-24 NOTE — Progress Notes (Signed)
  Echocardiogram 2D Echocardiogram has been performed.  Jennette Dubin 06/24/2017, 10:18 AM

## 2017-06-24 NOTE — Progress Notes (Signed)
PROGRESS NOTE    DMYA [name]  IPJ:[id number] DOB: 12-25-1933 DOA: 06/22/2017 PCP: [name], MD    Brief Narrative:  Jessica [name] is a 84 y.o. female with medical history significant of shortness of breath. This is a 84 year old female with extensive history of congestive heart failure, atrial fibrillation with pacemaker, hypertension, hyperlipidemia, anxiety, presented with progressive shortness of breath.  Especially with exertion.  Also worsening edema in her lower extremity, face and abdomen area.  She denied any chest pain.  She reported that she was compliant with her medication but she did not like her medication of amiodarone as she believes it makes her shortness of breath worse, and also gives her the shakes.  She denied of having any recent illnesses such as fever, chills, nausea, vomiting.  Denies any headaches visual change or asymmetric weaknesses.  Denied having any dysuria.  Denied of having any joint pain.  Denied any constipation or diarrhea.  Denied any rash.   ED Course:   Patient was hemodynamically stable in ED pulse 59, blood pressure was low on admission 84/67, has improved to 90/72.  Labs within normal limits, elevated troponin of 0.10, proBNP 291. Chest x-ray reviewed: Consistent with moderate interstitial edema, congestion, cardiomegaly Pacemaker leads are identified in the right atrium and right ventricleThere is cardiomegaly with pulmonary venous hypertension. Pacemaker There is a focal hiatal hernia. Patient has been placed on 2 L of oxygen, currently satting greater than 2%, 40 of Lasix was administered.      Assessment & Plan:   Principal Problem:   SOB (shortness of breath) Active Problems:   Hypothyroidism   DM type 2, controlled, with complication (HCC)   OBESITY, MORBID   Anxiety state   Essential hypertension   Edema   Paroxysmal atrial fibrillation (HCC)   CHF (congestive heart failure), NYHA class II, acute on  chronic, combined (HCC)   Moderate aortic stenosis  #1 dyspnea/shortness of breath  likely multifactorial secondary to acute on chronic combined CHF exacerbation, moderate aortic stenosis and A. fib with RVR Patient with some clinical improvement since admission however still with shortness of breath on minimal exertion.  Rate better controlled his dose of metoprolol per cardiology.  Amiodarone discontinued during this hospitalization as patient states it made her jittery and as such had been on compliant with it with worsening in her tremors.  Cardiac enzymes with elevated troponins likely more demand ischemia versus ACS.  2D echo with a EF of 30-35%, wall motion abnormalities, moderate to severe aortic stenosis.  Patient with urine output of 3.375 L over the past 24 hours.  Patient is -3.196 L during his hospitalization.  Patient for cardiac catheterization tomorrow per cardiology.  Continue Eliquis for anticoagulation.  Per cardiology.    2.  Acute on chronic combined CHF exacerbation/moderate aortic stenosis Patient with some clinical improvement however still significantly short of breath on exertion.  Urine output of 3.375 L over the past 24 hours.  Current weight of 251 pounds.  Doubt if weights are accurate as patient has had the same weight since admission despite diuresis.  Patient is -3.196 L during this hospitalization.  Patient with borderline blood pressure/hypotension.  Monitor closely with current dose of IV Lasix and metoprolol.  2D echo with a EF of 30-35% with wall motion abnormalities, moderate to severe aortic stenosis.  Patient for cardiac catheterization tomorrow per cardiology.    3.  A. fib with RVR Heart rate improved with increased dose of Lopressor.  Amiodarone on hold as patient states worsens her resting tremors and semi-compliant with this.  TSH within normal limits.  Lopressor increased per cardiology for better rate control.  2D echo with EF of 30-35%, some wall motion  abnormalities, moderate to severe aortic stenosis.  Patient for cardiac catheterization tomorrow.  Patient unable to tolerate amiodarone due to worsening hand tremors.  Cardiology unable to increase beta-blocker due to hypotension/borderline blood pressure.  Cardiology to consult with EP for further evaluation and management.  Pacemaker to be interrogated per cardiology.  Eliquis for anticoagulation.  Cardiology following and appreciate input and recommendations.  4.  Elevated troponin Likely secondary to demand ischemia secondary to CHF exacerbation.  Initial troponin seem to be slowly increasing however started to decrease.  2D echo with EF of 30-35%, diffuse hypokinesis and dyskinesis and basal and mid inferior septal, anterior septal and apical septal and anterior wall walls.  Severely thickened severely calcified aortic valve with moderate to severe aortic stenosis.  Continue beta-blocker and diuretics for now.  Done and pending.  Continue beta-blocker and diuretics.  Patient for left and right heart catheterization per cardiology tomorrow.  Cardiology following.  5.  Diabetes mellitus 2 Discontinued Amaryl.  CBGs ranging from 168 -231.  Continue sliding scale insulin.  Follow.  6.  Hypertension Blood pressure somewhat borderline.  She noted to have hypotension overnight with some dizzy spells. Maxzide on hold.  Amiodarone on hold.  Home regimen of Maxzide on hold.  Continue metoprolol for rate control.  Continue IV diuretics as per cardiology due to volume overload.   7.  Hypothyroidism TSH within normal limits.  Continue Synthroid.  8.  Morbid obesity  9.  Anxiety Stable.  Continue Effexor and Xanax as needed.    DVT prophylaxis: Eliquis Code Status: Full Family Communication: Updated patient at bedside. Disposition Plan: Home when clinically improved with resolution of acute CHF exacerbation and rate control of chronic A. fib.   Consultants:   Cardiology: Dr. Percival Spanish  06/22/2017  Procedures:   2D echo 06/23/2017  Chest x-ray 06/22/2017  Antimicrobials:  None    Subjective: Patient seen up in chair.  Patient states shortness of breath still with minimal exertion.  Denies any chest pain.  Overnight patient noted to be dizzy as well as hypotensive.  No nausea or emesis.  Good urine output.   Objective: Vitals:   06/24/17 0300 06/24/17 0500 06/24/17 0759 06/24/17 1223  BP:  109/71 100/70 (!) 97/50  Pulse:  (!) 118  66  Resp:  20  18  Temp:  98.3 F (36.8 C)  97.8 F (36.6 C)  TempSrc:  Oral  Oral  SpO2:  95%  93%  Weight: 113.9 kg (251 lb 1.6 oz)     Height:        Intake/Output Summary (Last 24 hours) at 06/24/2017 1334 Last data filed at 06/24/2017 1200 Gross per 24 hour  Intake 940 ml  Output 3276 ml  Net -2336 ml   Filed Weights   06/22/17 1636 06/23/17 0537 06/24/17 0300  Weight: 114.2 kg (251 lb 11.2 oz) 113.9 kg (251 lb 1.7 oz) 113.9 kg (251 lb 1.6 oz)    Examination:  General exam: Appears calm and comfortable  Respiratory system: Scattered diffuse crackles.  Fair air movement.  He is seen.  No rhonchi. Respiratory effort normal. Cardiovascular system: Regular rate rhythm no murmurs rubs or gallops.  1-2+ bilateral lower extremity edema.  Gastrointestinal system: Abdomen is soft, nondistended, nontender, no rebound, no guarding.  Positive bowel sounds.  Central nervous system: Alert and oriented. No focal neurological deficits. Extremities: Symmetric 5 x 5 power. Skin: No rashes, lesions or ulcers Psychiatry: Judgement and insight appear normal. Mood & affect appropriate.     Data Reviewed: I have personally reviewed following labs and imaging studies  CBC: Recent Labs  Lab 06/22/17 1012 06/22/17 1252 06/23/17 0006 06/24/17 0725  WBC 9.1 9.1 8.8 8.2  NEUTROABS 7.1  --   --   --   HGB 12.6 12.7 12.3 12.8  HCT 38.9 38.2 37.4 39.2  MCV 94.4 94.8 94.9 95.4  PLT 277 283 301 710   Basic Metabolic Panel: Recent Labs   Lab 06/22/17 1012 06/22/17 1252 06/23/17 0948 06/24/17 0725  NA 134*  --  133* 137  K 3.9  --  3.5 3.2*  CL 100*  --  98* 98*  CO2 21*  --  22 26  GLUCOSE 173*  --  299* 149*  BUN 15  --  20 21*  CREATININE 0.89 0.94 1.08* 0.90  CALCIUM 9.0 8.9 8.7* 8.9  MG  --  2.2 2.1  --   PHOS  --  2.9  --   --    GFR: Estimated Creatinine Clearance: 58.5 mL/min (by C-G formula based on SCr of 0.9 mg/dL). Liver Function Tests: Recent Labs  Lab 06/22/17 1012  AST 24  ALT 15  ALKPHOS 65  BILITOT 1.1  PROT 7.6  ALBUMIN 3.4*   No results for input(s): LIPASE, AMYLASE in the last 168 hours. No results for input(s): AMMONIA in the last 168 hours. Coagulation Profile: Recent Labs  Lab 06/23/17 0006  INR 1.68   Cardiac Enzymes: Recent Labs  Lab 06/22/17 1012 06/22/17 1252 06/22/17 1840 06/23/17 0006  TROPONINI 0.10* 0.13* 0.15* 0.12*   BNP (last 3 results) No results for input(s): PROBNP in the last 8760 hours. HbA1C: Recent Labs    06/22/17 1252  HGBA1C 6.4*   CBG: Recent Labs  Lab 06/23/17 1604 06/23/17 2101 06/23/17 2313 06/24/17 0727 06/24/17 1212  GLUCAP 134* 154* 125* 168* 231*   Lipid Profile: No results for input(s): CHOL, HDL, LDLCALC, TRIG, CHOLHDL, LDLDIRECT in the last 72 hours. Thyroid Function Tests: Recent Labs    06/22/17 1252  TSH 2.771   Anemia Panel: No results for input(s): VITAMINB12, FOLATE, FERRITIN, TIBC, IRON, RETICCTPCT in the last 72 hours. Sepsis Labs: No results for input(s): PROCALCITON, LATICACIDVEN in the last 168 hours.  No results found for this or any previous visit (from the past 240 hour(s)).       Radiology Studies: No results found.      Scheduled Meds: . allopurinol  100 mg Oral Daily  . apixaban  5 mg Oral BID  . B-complex with vitamin C  1 tablet Oral Daily  . cholecalciferol  1,000 Units Oral Daily  . docusate sodium  100 mg Oral BID  . furosemide  40 mg Intravenous Q6H  . insulin aspart  0-5  Units Subcutaneous QHS  . insulin aspart  0-9 Units Subcutaneous TID WC  . levothyroxine  75 mcg Oral QAC breakfast  . LORazepam  0.5 mg Oral BID  . metoprolol tartrate  25 mg Oral BID  . multivitamin  1 tablet Oral BID  . multivitamin with minerals  1 tablet Oral Daily  . potassium chloride SA  20 mEq Oral BID  . venlafaxine XR  75 mg Oral Q breakfast   Continuous Infusions:   LOS: 2 days  Time spent: 35 minutes    Irine Seal, MD [facility] Pager [phone]  If 7PM-7AM, please contact night-coverage [email or web address] Password [email or web address] 06/24/2017, 1:34 PM

## 2017-06-24 NOTE — Progress Notes (Signed)
Pt felt dizzy once, immediately using bathroom, but when vitals being checked it was stable, oxygen given @2l  just for comfort, will continue to monitor  Palma Holter, RN

## 2017-06-24 NOTE — Progress Notes (Signed)
Pt is aware of the procedure tomorrow. Going to be NPO since midnight, consent taken  Palma Holter, Therapist, sports

## 2017-06-24 NOTE — Progress Notes (Signed)
Progress Note  Patient Name: Jessica [name] Date of Encounter: 06/24/2017  Primary Cardiologist: Skeet Latch, MD   Subjective   She feels better but still significantly SOB, she was hypotensive and dizzy the last night.   Inpatient Medications    Scheduled Meds: . allopurinol  100 mg Oral Daily  . apixaban  5 mg Oral BID  . B-complex with vitamin C  1 tablet Oral Daily  . cholecalciferol  1,000 Units Oral Daily  . docusate sodium  100 mg Oral BID  . furosemide  40 mg Intravenous Q6H  . insulin aspart  0-5 Units Subcutaneous QHS  . insulin aspart  0-9 Units Subcutaneous TID WC  . levothyroxine  75 mcg Oral QAC breakfast  . LORazepam  0.5 mg Oral BID  . metoprolol tartrate  25 mg Oral BID  . multivitamin  1 tablet Oral BID  . multivitamin with minerals  1 tablet Oral Daily  . potassium chloride SA  20 mEq Oral Daily  . venlafaxine XR  75 mg Oral Q breakfast   Continuous Infusions:  PRN Meds: acetaminophen **OR** acetaminophen, albuterol, HYDROcodone-acetaminophen, levalbuterol, magnesium citrate, naphazoline-glycerin, ondansetron **OR** ondansetron (ZOFRAN) IV, perflutren lipid microspheres (DEFINITY) IV suspension, senna-docusate, sorbitol, temazepam, traZODone   Vital Signs    Vitals:   06/23/17 2302 06/24/17 0200 06/24/17 0300 06/24/17 0500  BP: 97/64 97/72  109/71  Pulse: (!) 129 (!) 133  (!) 118  Resp:  20  20  Temp:  98.1 F (36.7 C)  98.3 F (36.8 C)  TempSrc:  Oral  Oral  SpO2:  97%  95%  Weight:   251 lb 1.6 oz (113.9 kg)   Height:        Intake/Output Summary (Last 24 hours) at 06/24/2017 1105 Last data filed at 06/24/2017 0920 Gross per 24 hour  Intake 1180 ml  Output 2376 ml  Net -1196 ml   Filed Weights   06/22/17 1636 06/23/17 0537 06/24/17 0300  Weight: 251 lb 11.2 oz (114.2 kg) 251 lb 1.7 oz (113.9 kg) 251 lb 1.6 oz (113.9 kg)   Telemetry    A-fib with RVR- Personally Reviewed  Physical Exam   GEN: No acute distress.   Neck:  No JVD Cardiac: RRR, no murmurs, rubs, or gallops.  Respiratory: Clear to auscultation bilaterally. GI: Soft, nontender, non-distended  MS: No edema; No deformity. Neuro:  Nonfocal  Psych: Normal affect   Labs    Chemistry Recent Labs  Lab 06/22/17 1012 06/22/17 1252 06/23/17 0948 06/24/17 0725  NA 134*  --  133* 137  K 3.9  --  3.5 3.2*  CL 100*  --  98* 98*  CO2 21*  --  22 26  GLUCOSE 173*  --  299* 149*  BUN 15  --  20 21*  CREATININE 0.89 0.94 1.08* 0.90  CALCIUM 9.0 8.9 8.7* 8.9  PROT 7.6  --   --   --   ALBUMIN 3.4*  --   --   --   AST 24  --   --   --   ALT 15  --   --   --   ALKPHOS 65  --   --   --   BILITOT 1.1  --   --   --   GFRNONAA 59* 55* 47* 58*  GFRAA >60 >60 54* >60  ANIONGAP 13  --  13 13     Hematology Recent Labs  Lab 06/22/17 1252 06/23/17 0006 06/24/17 0725  WBC 9.1 8.8 8.2  RBC 4.03 3.94 4.11  HGB 12.7 12.3 12.8  HCT 38.2 37.4 39.2  MCV 94.8 94.9 95.4  MCH 31.5 31.2 31.1  MCHC 33.2 32.9 32.7  RDW 14.3 14.6 14.4  PLT 283 301 327   Cardiac Enzymes Recent Labs  Lab 06/22/17 1012 06/22/17 1252 06/22/17 1840 06/23/17 0006  TROPONINI 0.10* 0.13* 0.15* 0.12*   No results for input(s): TROPIPOC in the last 168 hours.   BNP Recent Labs  Lab 06/22/17 1252 06/23/17 0006 06/24/17 0725  BNP 360.3* 335.0* 436.1*    DDimer No results for input(s): DDIMER in the last 168 hours.   Radiology    No results found. Cardiac Studies    Patient Profile     84 y.o. female   [place]    1. Dyspnea on exertion -new decreased LVEF, 30-35%, aortic stenosis that's possibly severe and a-fib with RVR  2.  Chronic atrial fibrillation with rapid ventricular rate -Rate elevated 100-135, she is intolerant to amiodarone (hand tremor), unable to increase BB sec to hypotension. We will ask EP for help, ? Possible AVN ablation -Continue Eliquis for anticoagulation.  3.  Acute on chronic combined CHF -Likely due to elevated heart  rate and aortic stenosis.  Continue Lasix at 40 mg iv Q6H, Crackles B/L, Strict I&O and daily weight.   - consider L and right heart catheterization for evaluation of intracardiac pressures and aortic stenosis, also coronary stenosis  Replace potassium  4.  Elevated troponin -She denies any chest heaviness/pressure with dyspnea on exertion.  Continue cycle troponin.  5.  Status post [employer]tronic dual chamber pacemaker - Interrogate device.  6.  Diabetes - Per admitting team  7.  Hypertension - Blood pressure soft low.  Follow closely.   For questions or updates, please contact [place] Please consult [email or web address] for contact info under Cardiology/STEMI.   Signed, Ena Dawley, MD  06/24/2017, 11:05 AM

## 2017-06-25 DIAGNOSIS — I350 Nonrheumatic aortic (valve) stenosis: Secondary | ICD-10-CM

## 2017-06-25 LAB — GLUCOSE, CAPILLARY
GLUCOSE-CAPILLARY: 154 mg/dL — AB (ref 65–99)
Glucose-Capillary: 169 mg/dL — ABNORMAL HIGH (ref 65–99)
Glucose-Capillary: 210 mg/dL — ABNORMAL HIGH (ref 65–99)
Glucose-Capillary: 162 mg/dL — ABNORMAL HIGH (ref 65–99)

## 2017-06-25 LAB — BASIC METABOLIC PANEL
ANION GAP: 16 — AB (ref 5–15)
BUN: 26 mg/dL — ABNORMAL HIGH (ref 6–20)
CALCIUM: 9.1 mg/dL (ref 8.9–10.3)
CO2: 21 mmol/L — ABNORMAL LOW (ref 22–32)
Chloride: 97 mmol/L — ABNORMAL LOW (ref 101–111)
Creatinine, Ser: 0.96 mg/dL (ref 0.44–1.00)
GFR, EST NON AFRICAN AMERICAN: 54 mL/min — AB (ref >60)
GLUCOSE: 193 mg/dL — AB (ref 65–99)
POTASSIUM: 4.1 mmol/L (ref 3.5–5.1)
Sodium: 134 mmol/L — ABNORMAL LOW (ref 135–145)

## 2017-06-25 LAB — CBC
HEMATOCRIT: 40.3 % (ref 36.0–46.0)
Hemoglobin: 13.5 g/dL (ref 12.0–15.0)
MCH: 31.5 pg (ref 26.0–34.0)
MCHC: 33.5 g/dL (ref 30.0–36.0)
MCV: 94.2 fL (ref 78.0–100.0)
PLATELETS: 347 10*3/uL (ref 150–400)
RBC: 4.28 MIL/uL (ref 3.87–5.11)
RDW: 14.1 % (ref 11.5–15.5)
WBC: 9 10*3/uL (ref 4.0–10.5)

## 2017-06-25 LAB — MAGNESIUM: MAGNESIUM: 2.2 mg/dL (ref 1.7–2.4)

## 2017-06-25 MED ORDER — METOPROLOL TARTRATE 50 MG PO TABS
50.0000 mg | ORAL_TABLET | Freq: Two times a day (BID) | ORAL | Status: DC
  Administered 2017-06-25 – 2017-06-26 (×2): 50 mg via ORAL
  Filled 2017-06-25 (×2): qty 1

## 2017-06-25 MED ORDER — APIXABAN 5 MG PO TABS
5.0000 mg | ORAL_TABLET | Freq: Two times a day (BID) | ORAL | Status: DC
  Administered 2017-06-25 – 2017-06-26 (×2): 5 mg via ORAL
  Filled 2017-06-25 (×2): qty 1

## 2017-06-25 MED ORDER — FUROSEMIDE 40 MG PO TABS
40.0000 mg | ORAL_TABLET | Freq: Two times a day (BID) | ORAL | Status: DC
  Administered 2017-06-25 – 2017-06-26 (×2): 40 mg via ORAL
  Filled 2017-06-25 (×2): qty 1

## 2017-06-25 MED ORDER — APIXABAN 5 MG PO TABS: 5.0000 mg | ORAL_TABLET | Freq: Two times a day (BID) | ORAL | Status: DC
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
## 2017-06-25 NOTE — Clinical Social Work Note (Signed)
PT recommended no follow up.  CSW signing off. Consult again if any other social work needs arise.  Dayton Scrape, [place]

## 2017-06-25 NOTE — Progress Notes (Signed)
I was asked by cath lab to address patient's pre-cath Lasix as she is scheduled for today. She is noted to be on Eliquis, last dose last night, which would preclude ability to do cath. Have held both. Cath lab aware. We'll need to address in rounds. Mahkayla Preece PA-C

## 2017-06-25 NOTE — Progress Notes (Signed)
Physical Therapy Treatment Patient Details Name: Jessica [name] MRN: [id number] DOB: February 04, 1934 Today's Date: 06/25/2017    History of Present Illness Pt is an 84 y.o. female admitted 06/22/17 with SOB and edema; worked up for CHF exacerbation. Elevated troponin likely due to ischemic demand from CHF. PMH includes pacemaker, DM, HTN, PE/DVT, a-fib, obesity, peripheral neuropathy, osteoarthritis.     PT Comments    Ambulation limited to laps in room today secondary to pt fatigue and tachycardia. HR fluctuating between 100-140BPM before telemetry box lost power. Will continue to follow to progress ambulation.   Follow Up Recommendations  No PT follow up;Supervision - Intermittent     Equipment Recommendations  None recommended by PT    Recommendations for Other Services       Precautions / Restrictions Precautions Precautions: Fall Restrictions Weight Bearing Restrictions: No    Mobility  Bed Mobility Overal bed mobility: Modified Independent             General bed mobility comments: HOB elevated  Transfers Overall transfer level: Independent Equipment used: None             General transfer comment: Slightly impulsive, pt stood and walked to sink while PTA was arranging room and getting RW.  Ambulation/Gait Ambulation/Gait assistance: Min guard Ambulation Distance (Feet): 24 Feet(24x2) Assistive device: Rolling walker (2 wheeled) Gait Pattern/deviations: Step-through pattern;Decreased stride length Gait velocity: Decreased Gait velocity interpretation: Below normal speed for age/gender General Gait Details: Did laps in room as pt fatigued quickly today, requiring seated rest break. BPM fluctuated from 100-140 throughout session. VC for RW proximity.   Stairs            Wheelchair Mobility    Modified Rankin (Stroke Patients Only)       Balance Overall balance assessment: Needs assistance   Sitting balance-Leahy Scale: Good        Standing balance-Leahy Scale: Fair Standing balance comment: walked to sink w/o UE support, however pt is unsteady w/o support.                            Cognition Arousal/Alertness: Awake/alert Behavior During Therapy: WFL for tasks assessed/performed Overall Cognitive Status: Within Functional Limits for tasks assessed                                        Exercises      General Comments        Pertinent Vitals/Pain Pain Assessment: No/denies pain    Home Living                      Prior Function            PT Goals (current goals can now be found in the care plan section) Acute Rehab PT Goals Patient Stated Goal: Return home PT Goal Formulation: With patient Time For Goal Achievement: 07/07/17 Potential to Achieve Goals: Good Progress towards PT goals: Progressing toward goals    Frequency    Min 3X/week      PT Plan Current plan remains appropriate    Co-evaluation              AM-PAC PT "6 Clicks" Daily Activity  Outcome Measure  Difficulty turning over in bed (including adjusting bedclothes, sheets and blankets)?: None Difficulty moving from lying on back to sitting on  the side of the bed? : None Difficulty sitting down on and standing up from a chair with arms (e.g., wheelchair, bedside commode, etc,.)?: None Help needed moving to and from a bed to chair (including a wheelchair)?: A Little Help needed walking in hospital room?: A Little Help needed climbing 3-5 steps with a railing? : A Little 6 Click Score: 21    End of Session Equipment Utilized During Treatment: Gait belt Activity Tolerance: Patient tolerated treatment well;Patient limited by fatigue Patient left: in chair;with call bell/phone within reach;with nursing/sitter in room Nurse Communication: Mobility status;Other (comment)(telemetry box not turning on) PT Visit Diagnosis: Other abnormalities of gait and mobility (R26.89)     Time:  5009-3818 PT Time Calculation (min) (ACUTE ONLY): 26 min  Charges:  $Gait Training: 23-37 mins                    G Codes:       Benjiman Core, [place] Pager [phone] Acute Rehab  Allena Katz 06/25/2017, 11:10 AM

## 2017-06-25 NOTE — Progress Notes (Signed)
PROGRESS NOTE    Jessica [name]  JJO:[id number] DOB: Feb 26, 1934 DOA: 06/22/2017 PCP: [name], MD    Brief Narrative:  Jessica [name] is a 84 y.o. female with medical history significant of shortness of breath. This is a 84 year old female with extensive history of congestive heart failure, atrial fibrillation with pacemaker, hypertension, hyperlipidemia, anxiety, presented with progressive shortness of breath.  Especially with exertion.  Also worsening edema in her lower extremity, face and abdomen area.  She denied any chest pain.  She reported that she was compliant with her medication but she did not like her medication of amiodarone as she believes it makes her shortness of breath worse, and also gives her the shakes.  She denied of having any recent illnesses such as fever, chills, nausea, vomiting.  Denies any headaches visual change or asymmetric weaknesses.  Denied having any dysuria.  Denied of having any joint pain.  Denied any constipation or diarrhea.  Denied any rash.   ED Course:   Patient was hemodynamically stable in ED pulse 59, blood pressure was low on admission 84/67, has improved to 90/72.  Labs within normal limits, elevated troponin of 0.10, proBNP 291. Chest x-ray reviewed: Consistent with moderate interstitial edema, congestion, cardiomegaly Pacemaker leads are identified in the right atrium and right ventricleThere is cardiomegaly with pulmonary venous hypertension. Pacemaker There is a focal hiatal hernia. Patient has been placed on 2 L of oxygen, currently satting greater than 2%, 40 of Lasix was administered.      Assessment & Plan:   Principal Problem:   SOB (shortness of breath) Active Problems:   Hypothyroidism   DM type 2, controlled, with complication (HCC)   OBESITY, MORBID   Anxiety state   Essential hypertension   Edema   Paroxysmal atrial fibrillation (HCC)   CHF (congestive heart failure), NYHA class II, acute on  chronic, combined (HCC)   Moderate aortic stenosis  #1 dyspnea/shortness of breath  likely multifactorial secondary to acute on chronic combined CHF exacerbation, moderate aortic stenosis and A. fib with RVR Patient with some clinical improvement since admission.  Rate better controlled on increased dose of metoprolol however still tachycardic.  Patient with borderline blood pressure.  Amiodarone discontinued during this hospitalization as patient states it made her jittery and as such had been not compliant with it with worsening hand tremors.  Cardiac enzymes with elevated troponins likely more demand ischemia versus ACS.  2D echo with a EF of 30-35%, wall motion abnormalities, moderate to severe aortic stenosis.  Patient with urine output of 3.625 L over the past 24 hours.  Patient is - 5.143 L during his hospitalization.  Patient for cardiac catheterization however postponed per cardiology.  Eliquis on hold in anticipation of procedure. Per cardiology.    2.  Acute on chronic combined CHF exacerbation/moderate aortic stenosis Patient with clinical improvement in terms of her shortness of breath.  Patient with good urine output.  Patient with a urine output of 3.625 L over the past 24 hours.  Patient is -5.143 L during his hospitalization.  Current weight is 249 pounds from 251 pounds.  Doubt if weights are accurate as patient has had the same weight since admission despite diuresis.  Monitor closely with current dose of IV Lasix and metoprolol.  2D echo with a EF of 30-35% with wall motion abnormalities, moderate to severe aortic stenosis.  Patient was for cardiac catheterization today however postponed per cardiology.    3.  A. fib with RVR  Heart rate improved with increased dose of Lopressor however still tachycardic.  Blood pressure borderline..  Amiodarone on hold as patient states worsens her resting tremors and semi-compliant with this.  TSH within normal limits.  Lopressor increased per  cardiology for better rate control.  2D echo with EF of 30-35%, some wall motion abnormalities, moderate to severe aortic stenosis.  Patient for cardiac catheterization tomorrow.  Patient unable to tolerate amiodarone due to worsening hand tremors.  Cardiology unable to increase beta-blocker due to hypotension/borderline blood pressure.  Cardiology to consult with EP for further evaluation and management.  Pacemaker to be interrogated per cardiology.  Eliquis on hold in anticipation of possible procedure.  Cardiology following and appreciate input and recommendations.  4.  Elevated troponin Likely secondary to demand ischemia secondary to CHF exacerbation.  Initial troponin seem to be slowly increasing however started to decrease.  2D echo with EF of 30-35%, diffuse hypokinesis and dyskinesis and basal and mid inferior septal, anterior septal and apical septal and anterior wall walls.  Severely thickened severely calcified aortic valve with moderate to severe aortic stenosis.  Continue beta-blocker and diuretics for now. Continue beta-blocker and diuretics.  Patient for left and right heart catheterization per cardiology today however currently postponed per cardiology.   5.  Diabetes mellitus 2 Discontinued Amaryl.  CBGs ranging from 169 -210.  Continue sliding scale insulin.  Follow.  6.  Hypertension Blood pressure somewhat borderline.  She noted to have hypotension 2 nights ago with some dizzy spells.  Maxzide on hold.  Patient unable to tolerate amiodarone.  Continue metoprolol for rate control however unable to increase due to borderline blood pressure issues.  Patient has been transitioned from IV diuretics to oral diuretics per cardiology for volume overload.    7.  Hypothyroidism TSH within normal limits.  Continue Synthroid.  Repeat thyroid function studies in 4-6 weeks.  8.  Morbid obesity  9.  Anxiety Continue home regimen of Effexor and Xanax as needed.     DVT prophylaxis:  SCDs Code Status: Full Family Communication: Updated patient at bedside. Disposition Plan: Home when clinically improved with resolution of acute CHF exacerbation and rate control of chronic A. fib.   Consultants:   Cardiology: Dr. Percival Spanish 06/22/2017  Electrophysiology pending  Procedures:   2D echo 06/23/2017  Chest x-ray 06/22/2017  Antimicrobials:  None    Subjective: Patient laying in bed.  Some improvement with shortness of breath.  Patient currently n.p.o. while underwent and possible cardiac procedures to be done.  Good urine output.  Denies any dizziness today.   Objective: Vitals:   06/25/17 0100 06/25/17 0417 06/25/17 1056 06/25/17 1246  BP: (!) 123/91 112/72 96/70 115/74  Pulse: 68 91 98 64  Resp:  18  18  Temp:  98 F (36.7 C)  98.2 F (36.8 C)  TempSrc:  Oral  Oral  SpO2:  93% 97% 92%  Weight:  113.4 kg (249 lb 14.4 oz)    Height:        Intake/Output Summary (Last 24 hours) at 06/25/2017 1306 Last data filed at 06/25/2017 1100 Gross per 24 hour  Intake 687.17 ml  Output 2975 ml  Net -2287.83 ml   Filed Weights   06/23/17 0537 06/24/17 0300 06/25/17 0417  Weight: 113.9 kg (251 lb 1.7 oz) 113.9 kg (251 lb 1.6 oz) 113.4 kg (249 lb 14.4 oz)    Examination:  General exam: Appears calm and comfortable  Respiratory system: Some bibasilar crackles.  No wheezing.  No rhonchi.  Fair air movement.  Normal respiratory effort.  Cardiovascular system: Irregularly irregular.  No murmurs rubs or gallops.  Trace to 1+ bilateral lower extremity edema.  Gastrointestinal system: Abdomen is NT/ND/+BS/No rebound.  No guarding.  Central nervous system: Alert and oriented. No focal neurological deficits. Extremities: Symmetric 5 x 5 power. Skin: No rashes, lesions or ulcers Psychiatry: Judgement and insight appear normal. Mood & affect appropriate.     Data Reviewed: I have personally reviewed following labs and imaging studies  CBC: Recent Labs  Lab  06/22/17 1012 06/22/17 1252 06/23/17 0006 06/24/17 0725 06/25/17 0531  WBC 9.1 9.1 8.8 8.2 9.0  NEUTROABS 7.1  --   --   --   --   HGB 12.6 12.7 12.3 12.8 13.5  HCT 38.9 38.2 37.4 39.2 40.3  MCV 94.4 94.8 94.9 95.4 94.2  PLT 277 283 301 327 010   Basic Metabolic Panel: Recent Labs  Lab 06/22/17 1012 06/22/17 1252 06/23/17 0948 06/24/17 0725 06/25/17 0531  NA 134*  --  133* 137 134*  K 3.9  --  3.5 3.2* 4.1  CL 100*  --  98* 98* 97*  CO2 21*  --  22 26 21*  GLUCOSE 173*  --  299* 149* 193*  BUN 15  --  20 21* 26*  CREATININE 0.89 0.94 1.08* 0.90 0.96  CALCIUM 9.0 8.9 8.7* 8.9 9.1  MG  --  2.2 2.1  --  2.2  PHOS  --  2.9  --   --   --    GFR: Estimated Creatinine Clearance: 54.7 mL/min (by C-G formula based on SCr of 0.96 mg/dL). Liver Function Tests: Recent Labs  Lab 06/22/17 1012  AST 24  ALT 15  ALKPHOS 65  BILITOT 1.1  PROT 7.6  ALBUMIN 3.4*   No results for input(s): LIPASE, AMYLASE in the last 168 hours. No results for input(s): AMMONIA in the last 168 hours. Coagulation Profile: Recent Labs  Lab 06/23/17 0006  INR 1.68   Cardiac Enzymes: Recent Labs  Lab 06/22/17 1012 06/22/17 1252 06/22/17 1840 06/23/17 0006  TROPONINI 0.10* 0.13* 0.15* 0.12*   BNP (last 3 results) No results for input(s): PROBNP in the last 8760 hours. HbA1C: No results for input(s): HGBA1C in the last 72 hours. CBG: Recent Labs  Lab 06/24/17 1212 06/24/17 1626 06/24/17 2121 06/25/17 0738 06/25/17 1245  GLUCAP 231* 165* 127* 154* 169*   Lipid Profile: No results for input(s): CHOL, HDL, LDLCALC, TRIG, CHOLHDL, LDLDIRECT in the last 72 hours. Thyroid Function Tests: No results for input(s): TSH, T4TOTAL, FREET4, T3FREE, THYROIDAB in the last 72 hours. Anemia Panel: No results for input(s): VITAMINB12, FOLATE, FERRITIN, TIBC, IRON, RETICCTPCT in the last 72 hours. Sepsis Labs: No results for input(s): PROCALCITON, LATICACIDVEN in the last 168 hours.  Recent  Results (from the past 240 hour(s))  Urine Culture     Status: Abnormal   Collection Time: 06/23/17  2:41 PM  Result Value Ref Range Status   Specimen Description URINE, RANDOM  Final   Special Requests NONE  Final   Culture (A)  Final    <10,000 COLONIES/mL INSIGNIFICANT GROWTH Performed at [facility], [place] [address], [place], [place] [address]    Report Status 06/24/2017 FINAL  Final         Radiology Studies: No results found.      Scheduled Meds: . allopurinol  100 mg Oral Daily  . B-complex with vitamin C  1 tablet  Oral Daily  . cholecalciferol  1,000 Units Oral Daily  . docusate sodium  100 mg Oral BID  . furosemide  40 mg Oral BID  . insulin aspart  0-5 Units Subcutaneous QHS  . insulin aspart  0-9 Units Subcutaneous TID WC  . levothyroxine  75 mcg Oral QAC breakfast  . LORazepam  0.5 mg Oral BID  . metoprolol tartrate  25 mg Oral BID  . multivitamin  1 tablet Oral BID  . multivitamin with minerals  1 tablet Oral Daily  . sodium chloride flush  3 mL Intravenous Q12H  . venlafaxine XR  75 mg Oral Q breakfast   Continuous Infusions: . sodium chloride    . sodium chloride 10 mL/hr at 06/25/17 0635     LOS: 3 days    Time spent: 35 minutes    Irine Seal, MD [facility] Pager [phone]  If 7PM-7AM, please contact night-coverage [email or web address] Password [facility] 06/25/2017, 1:06 PM

## 2017-06-25 NOTE — Progress Notes (Signed)
Progress Note  Patient Name: Jessica [name] Date of Encounter: 06/25/2017  Primary Cardiologist: Skeet Latch, MD   Subjective   Does not want to live like this. Frustrated but very pleasant. Minimal SOB. No CP.   Inpatient Medications    Scheduled Meds: . allopurinol  100 mg Oral Daily  . B-complex with vitamin C  1 tablet Oral Daily  . cholecalciferol  1,000 Units Oral Daily  . docusate sodium  100 mg Oral BID  . insulin aspart  0-5 Units Subcutaneous QHS  . insulin aspart  0-9 Units Subcutaneous TID WC  . levothyroxine  75 mcg Oral QAC breakfast  . LORazepam  0.5 mg Oral BID  . metoprolol tartrate  25 mg Oral BID  . multivitamin  1 tablet Oral BID  . multivitamin with minerals  1 tablet Oral Daily  . sodium chloride flush  3 mL Intravenous Q12H  . venlafaxine XR  75 mg Oral Q breakfast   Continuous Infusions: . sodium chloride    . sodium chloride 10 mL/hr at 06/25/17 0635   PRN Meds: sodium chloride, acetaminophen **OR** acetaminophen, albuterol, HYDROcodone-acetaminophen, levalbuterol, magnesium citrate, naphazoline-glycerin, ondansetron **OR** ondansetron (ZOFRAN) IV, senna-docusate, sodium chloride flush, sorbitol, temazepam, traZODone   Vital Signs    Vitals:   06/24/17 1952 06/25/17 0000 06/25/17 0100 06/25/17 0417  BP: 111/73 116/90 (!) 123/91 112/72  Pulse: (!) 138 (!) 125 68 91  Resp: 18 18  18   Temp: 98.4 F (36.9 C) 97.6 F (36.4 C)  98 F (36.7 C)  TempSrc: Oral Oral  Oral  SpO2: 95% 93%  93%  Weight:    249 lb 14.4 oz (113.4 kg)  Height:        Intake/Output Summary (Last 24 hours) at 06/25/2017 0939 Last data filed at 06/25/2017 8182 Gross per 24 hour  Intake 597.67 ml  Output 3475 ml  Net -2877.33 ml   Filed Weights   06/23/17 0537 06/24/17 0300 06/25/17 0417  Weight: 251 lb 1.7 oz (113.9 kg) 251 lb 1.6 oz (113.9 kg) 249 lb 14.4 oz (113.4 kg)    Telemetry    AFIB 120's, occasional pacing - Personally Reviewed  ECG    No  new - Personally Reviewed  Physical Exam   GEN: No acute distress.  Morbidly obese Neck: No JVD Cardiac: irreg tachy, 2/6 SM, no rubs, or gallops.  Respiratory: Clear to auscultation bilaterally. GI: Soft, nontender, non-distended  MS: trace edema; No deformity. Neuro:  Nonfocal  Psych: Normal affect   Labs    Chemistry Recent Labs  Lab 06/22/17 1012  06/23/17 0948 06/24/17 0725 06/25/17 0531  NA 134*  --  133* 137 134*  K 3.9  --  3.5 3.2* 4.1  CL 100*  --  98* 98* 97*  CO2 21*  --  22 26 21*  GLUCOSE 173*  --  299* 149* 193*  BUN 15  --  20 21* 26*  CREATININE 0.89   < > 1.08* 0.90 0.96  CALCIUM 9.0   < > 8.7* 8.9 9.1  PROT 7.6  --   --   --   --   ALBUMIN 3.4*  --   --   --   --   AST 24  --   --   --   --   ALT 15  --   --   --   --   ALKPHOS 65  --   --   --   --  BILITOT 1.1  --   --   --   --   GFRNONAA 59*   < > 47* 58* 54*  GFRAA >60   < > 54* >60 >60  ANIONGAP 13  --  13 13 16*   < > = values in this interval not displayed.     Hematology Recent Labs  Lab 06/23/17 0006 06/24/17 0725 06/25/17 0531  WBC 8.8 8.2 9.0  RBC 3.94 4.11 4.28  HGB 12.3 12.8 13.5  HCT 37.4 39.2 40.3  MCV 94.9 95.4 94.2  MCH 31.2 31.1 31.5  MCHC 32.9 32.7 33.5  RDW 14.6 14.4 14.1  PLT 301 327 347    Cardiac Enzymes Recent Labs  Lab 06/22/17 1012 06/22/17 1252 06/22/17 1840 06/23/17 0006  TROPONINI 0.10* 0.13* 0.15* 0.12*   No results for input(s): TROPIPOC in the last 168 hours.   BNP Recent Labs  Lab 06/22/17 1252 06/23/17 0006 06/24/17 0725  BNP 360.3* 335.0* 436.1*     DDimer No results for input(s): DDIMER in the last 168 hours.   Radiology    No results found.  Cardiac Studies   ECHO with EF 35%, moderate to severe AS (EF down from 45%)   Patient Profile     84 y.o. female   Assessment & Plan    Permanent AFIB now RVR  - can not control HR with beta blocker. Hypotension. Unable to take amiodarone due to side effect. Has pacer in place.    - EP consulted for AV nodal ablation to allow for rate control and hopeful improvement of cardiomyopathy (may be in part tachy mediated)  - Holding Eliquis (last dose 2200 3/24)  - Hopefully symptoms will improve if HR can improve.   Elevated troponin  - demand ischemia with AFIB RVR  - Hold on cath for now  Acute systolic HF  - BUN increasing, will change to lasix 40 BID PO (was on home 40 QD).   Aortic stenosis  - mod to severe.   - would be challenging to obtain accurate hemodynamics with her AFIB RVR. Will address AFIB first. Hold on cath. At some point cath would be reasonable.   Morbid obesity  - encourage weight loss.   Pacemaker  - Medtronic. Dual chamber. Dr. Rayann Heman.  For questions or updates, please contact [place] Please consult [email or web address] for contact info under Cardiology/STEMI.      Signed, Candee Furbish, MD  06/25/2017, 9:39 AM

## 2017-06-26 ENCOUNTER — Encounter (HOSPITAL_COMMUNITY)
Admission: EM | Disposition: A | Discharge status: Home / Self Care | Payer: Self-pay | Source: Home / Self Care | Attending: Internal Medicine

## 2017-06-26 LAB — BASIC METABOLIC PANEL
ANION GAP: 14 (ref 5–15)
BUN: 26 mg/dL — ABNORMAL HIGH (ref 6–20)
CALCIUM: 9.3 mg/dL (ref 8.9–10.3)
CHLORIDE: 99 mmol/L — AB (ref 101–111)
CO2: 24 mmol/L (ref 22–32)
CREATININE: 0.91 mg/dL (ref 0.44–1.00)
GFR calc Af Amer: >60 mL/min (ref >60)
GFR calc non Af Amer: 58 mL/min — ABNORMAL LOW (ref >60)
Glucose, Bld: 148 mg/dL — ABNORMAL HIGH (ref 65–99)
Potassium: 3.8 mmol/L (ref 3.5–5.1)
SODIUM: 137 mmol/L (ref 135–145)

## 2017-06-26 LAB — CBC
HEMATOCRIT: 40.2 % (ref 36.0–46.0)
HEMOGLOBIN: 13.2 g/dL (ref 12.0–15.0)
MCH: 30.7 pg (ref 26.0–34.0)
MCHC: 32.8 g/dL (ref 30.0–36.0)
MCV: 93.5 fL (ref 78.0–100.0)
Platelets: 360 10*3/uL (ref 150–400)
RBC: 4.3 MIL/uL (ref 3.87–5.11)
RDW: 14 % (ref 11.5–15.5)
WBC: 7.5 10*3/uL (ref 4.0–10.5)

## 2017-06-26 LAB — GLUCOSE, CAPILLARY
GLUCOSE-CAPILLARY: 121 mg/dL — AB (ref 65–99)
GLUCOSE-CAPILLARY: 175 mg/dL — AB (ref 65–99)

## 2017-06-26 SURGERY — RIGHT/LEFT HEART CATH AND CORONARY ANGIOGRAPHY
Anesthesia: LOCAL

## 2017-06-26 MED ORDER — FUROSEMIDE 40 MG PO TABS: 40.0000 mg | ORAL_TABLET | Freq: Two times a day (BID) | ORAL | 0 refills | Status: DC

## 2017-06-26 MED ORDER — ACETAMINOPHEN 325 MG PO TABS: 650.0000 mg | ORAL_TABLET | Freq: Four times a day (QID) | ORAL | Status: DC | PRN

## 2017-06-26 MED ORDER — METOPROLOL TARTRATE 50 MG PO TABS: 50.0000 mg | ORAL_TABLET | Freq: Two times a day (BID) | ORAL | 0 refills | Status: DC

## 2017-06-26 NOTE — Progress Notes (Signed)
Progress Note  Patient Name: Jessica [name] Date of Encounter: 06/26/2017  Primary Cardiologist: Skeet Latch, MD   Subjective   Still with fatigue, mild SOB, no CP. But overall feels much better.   Inpatient Medications    Scheduled Meds: . allopurinol  100 mg Oral Daily  . apixaban  5 mg Oral BID  . B-complex with vitamin C  1 tablet Oral Daily  . cholecalciferol  1,000 Units Oral Daily  . docusate sodium  100 mg Oral BID  . furosemide  40 mg Oral BID  . insulin aspart  0-5 Units Subcutaneous QHS  . insulin aspart  0-9 Units Subcutaneous TID WC  . levothyroxine  75 mcg Oral QAC breakfast  . LORazepam  0.5 mg Oral BID  . metoprolol tartrate  50 mg Oral BID  . multivitamin  1 tablet Oral BID  . multivitamin with minerals  1 tablet Oral Daily  . sodium chloride flush  3 mL Intravenous Q12H  . venlafaxine XR  75 mg Oral Q breakfast   Continuous Infusions: . sodium chloride    . sodium chloride Stopped (06/25/17 1405)   PRN Meds: sodium chloride, acetaminophen **OR** acetaminophen, albuterol, HYDROcodone-acetaminophen, levalbuterol, magnesium citrate, naphazoline-glycerin, ondansetron **OR** ondansetron (ZOFRAN) IV, senna-docusate, sodium chloride flush, sorbitol, temazepam, traZODone   Vital Signs    Vitals:   06/25/17 1758 06/25/17 2022 06/26/17 0459 06/26/17 0837  BP: 101/87 102/74 110/84 105/70  Pulse: 96 69 65   Resp:  18 18   Temp:  97.8 F (36.6 C) 97.7 F (36.5 C)   TempSrc:  Oral Oral   SpO2: 96% 94% 94% 96%  Weight:   248 lb (112.5 kg)   Height:        Intake/Output Summary (Last 24 hours) at 06/26/2017 1041 Last data filed at 06/26/2017 0854 Gross per 24 hour  Intake 712.5 ml  Output 900 ml  Net -187.5 ml   Filed Weights   06/24/17 0300 06/25/17 0417 06/26/17 0459  Weight: 251 lb 1.6 oz (113.9 kg) 249 lb 14.4 oz (113.4 kg) 248 lb (112.5 kg)    Telemetry    AFIB better control 80-100 now - Personally Reviewed  ECG    AFIB -  Personally Reviewed  Physical Exam   GEN: No acute distress. Obese  Neck: No JVD Cardiac: IRREG better rate, 2/6 SM, no rubs, or gallops.  Respiratory: Clear to auscultation bilaterally. GI: Soft, nontender, non-distended  MS: No edema; No deformity. Neuro:  Nonfocal  Psych: Normal affect   Labs    Chemistry Recent Labs  Lab 06/22/17 1012  06/24/17 0725 06/25/17 0531 06/26/17 0516  NA 134*   < > 137 134* 137  K 3.9   < > 3.2* 4.1 3.8  CL 100*   < > 98* 97* 99*  CO2 21*   < > 26 21* 24  GLUCOSE 173*   < > 149* 193* 148*  BUN 15   < > 21* 26* 26*  CREATININE 0.89   < > 0.90 0.96 0.91  CALCIUM 9.0   < > 8.9 9.1 9.3  PROT 7.6  --   --   --   --   ALBUMIN 3.4*  --   --   --   --   AST 24  --   --   --   --   ALT 15  --   --   --   --   ALKPHOS 65  --   --   --   --  BILITOT 1.1  --   --   --   --   GFRNONAA 59*   < > 58* 54* 58*  GFRAA >60   < > >60 >60 >60  ANIONGAP 13   < > 13 16* 14   < > = values in this interval not displayed.     Hematology Recent Labs  Lab 06/24/17 0725 06/25/17 0531 06/26/17 0516  WBC 8.2 9.0 7.5  RBC 4.11 4.28 4.30  HGB 12.8 13.5 13.2  HCT 39.2 40.3 40.2  MCV 95.4 94.2 93.5  MCH 31.1 31.5 30.7  MCHC 32.7 33.5 32.8  RDW 14.4 14.1 14.0  PLT 327 347 360    Cardiac Enzymes Recent Labs  Lab 06/22/17 1012 06/22/17 1252 06/22/17 1840 06/23/17 0006  TROPONINI 0.10* 0.13* 0.15* 0.12*   No results for input(s): TROPIPOC in the last 168 hours.   BNP Recent Labs  Lab 06/22/17 1252 06/23/17 0006 06/24/17 0725  BNP 360.3* 335.0* 436.1*     DDimer No results for input(s): DDIMER in the last 168 hours.   Radiology    No results found.  Cardiac Studies   ECHO with EF 35%, moderate to severe AS (EF down from 45%)      Patient Profile     84 y.o. female with AFIB, CHF  Assessment & Plan     Permanent AFIB   - difficult to control HR with beta blocker secondary to hypotension. Unable to take amiodarone due to side  effect. Has pacer in place.   - EP consulted for discussion of AV nodal ablation to allow for rate control and hopeful improvement of cardiomyopathy (may be in part tachy mediated) but she is not a good candidate. Her heart rates have been fairly reasonable in AFIB prior to this admit. Increased rate control with metoprolol.   - Back on Eliquis  - Hopefully symptoms will improve if HR can improve.   Elevated troponin  - demand ischemia with AFIB RVR  - Hold on cath for now. On Eliquis.   Acute systolic HF  - BUN increased, now on lasix 40 BID PO (was on home 40 QD).   - Encouraged fluids 1.5 L at home. No salt. Weight loss.   - She does not appear fluid overloaded.   Aortic stenosis  - mod to severe.   - would be challenging to obtain accurate hemodynamics with her AFIB RVR. Will address AFIB rate control first. Hold on cath. At some point cath would be reasonable to exclude 3v disease and need for AVR.   Morbid obesity BMI 45  - encourage weight loss. Likely crux of many issues.   Pacemaker  - Medtronic. Dual chamber. Dr. Rayann Heman.  OK with DC from our perspective.    Will have follow up in AFIB clinic in one week.  Try to restart low dose ARB at that time.     For questions or updates, please contact [place] Please consult [email or web address] for contact info under Cardiology/STEMI.      Signed, Candee Furbish, MD  06/26/2017, 10:41 AM

## 2017-06-26 NOTE — Progress Notes (Signed)
Pt discharged home in stable condition, reviewed medications, appts, sx to monitor and call md, reviewed heart failure booklet and zones, rx given to pt, all questions entertained and answered

## 2017-06-26 NOTE — Care Management Important Message (Signed)
Important Message  Patient Details  Name: RANATA LAUGHERY MRN: [id number] Date of Birth: 1933/04/07   Medicare Important Message Given:  Yes    Omari Mcmanaway P Rimas [place] 06/26/2017, 1:19 PM

## 2017-06-26 NOTE — Discharge Summary (Signed)
Physician Discharge Summary  Jessica [name] JEH:[id number] DOB: 1933/09/08 DOA: 06/22/2017  PCP: [name], MD  Admit date: 06/22/2017 Discharge date: 06/26/2017  Time spent: 55 minutes  Recommendations for Outpatient Follow-up:  1. Follow-up in A. fib clinic 07/03/2017 2. Follow-up with Plotnikov, Evie Lacks, MD as scheduled. 3. Follow-up with Dr. Skeet Latch, cardiology.  Office will call with appointment time.   Discharge Diagnoses:  Principal Problem:   SOB (shortness of breath) Active Problems:   CHF (congestive heart failure), NYHA class II, acute on chronic, combined (HCC)   Paroxysmal atrial fibrillation (HCC)   Hypothyroidism   DM type 2, controlled, with complication (HCC)   OBESITY, MORBID   Anxiety state   Essential hypertension   Edema   GERD (gastroesophageal reflux disease)   Moderate aortic stenosis   Discharge Condition: Stable and improved  Diet recommendation: Heart healthy.  1200 cc fluid restriction per day.  Filed Weights   06/24/17 0300 06/25/17 0417 06/26/17 0459  Weight: 113.9 kg (251 lb 1.6 oz) 113.4 kg (249 lb 14.4 oz) 112.5 kg (248 lb)    History of present illness:  Per Jessica [name] is a 84 y.o. female with medical history significant of shortness of breath. This is a 84 year old female with extensive history of congestive heart failure, atrial fibrillation with pacemaker, hypertension, hyperlipidemia, anxiety, presented with progressive shortness of breath.  Especially with exertion.  Also worsening edema in her lower extremity, face and abdomen area.  She denied any chest pain.  She reported that she was compliant with her medication but she did not like her medication of amiodarone as she believed it made her shortness of breath worse, and also gave her the shakes.  She denied having any recent illnesses such as fever, chills, nausea, vomiting.  Denied any headaches visual change or asymmetric weaknesses.   Denied having any dysuria.  Denied having any joint pain.  Denied any constipation or diarrhea.  Denied any rash.   ED Course:   Patient was hemodynamically stable in ED pulse 59, blood pressure was low on admission 84/67, has improved to 90/72.  Labs within normal limits, elevated troponin of 0.10, proBNP 291. Chest x-ray reviewed: Consistent with moderate interstitial edema, congestion, cardiomegaly Pacemaker leads are identified in the right atrium and right ventricleThere is cardiomegaly with pulmonary venous hypertension. Pacemaker There was a focal hiatal hernia. Patient was placed on 2 L of oxygen, currently satting greater than 2%, 40 of Lasix was administered.     Hospital Course:  #1 dyspnea/shortness of breath  likely multifactorial secondary to acute on chronic combined CHF exacerbation, moderate aortic stenosis and A. fib with RVR Patient was placed on IV Lasix cardiology consulted.  Due to noncompliance and side effects of amiodarone patient was not placed on this during the hospitalization.  Patient's beta-blocker dose was adjusted per cardiology.  Amiodarone discontinued during this hospitalization as patient stated it made her jittery and as such had been not compliant with it with worsening hand tremors.  Cardiac enzymes with elevated troponins likely more demand ischemia versus ACS.  2D echo with a EF of 30-35%, wall motion abnormalities, moderate to severe aortic stenosis.  Patient with good urine output.  Patient was - 5.143 L during his hospitalization.  Patient for cardiac catheterization however postponed per cardiology.  Eliquis subsequently resumed on discharge.  Outpatient follow-up with cardiology.   2.  Acute on chronic combined CHF exacerbation/moderate aortic stenosis Patient was placed on IV Lasix during  the hospitalization due to acute exacerbation of CHF.  Patient had good urine output with symptomatic improvement. 2D echo with a EF of 30-35% with wall motion  abnormalities, moderate to severe aortic stenosis.  Patient was for cardiac catheterization  however deferred to the outpatient setting.  IV Lasix was subsequently transitioned to oral Lasix.  Patient will be discharged in stable and improved condition and will follow up with cardiology in the outpatient setting.    3.  A. fib with RVR Patient was admitted with afib with RVR.  Heart rate improved with increased dose of Lopressor however still tachycardic.  Blood pressure borderline..  Amiodarone on hold as patient stated worsened her resting tremors and semi-compliant with this.  TSH within normal limits.  Lopressor increased per cardiology for better rate control.  2D echo with EF of 30-35%, some wall motion abnormalities, moderate to severe aortic stenosis.  Patient for cardiac catheterization tomorrow.  Patient unable to tolerate amiodarone due to worsening hand tremors.  Cardiology unable to increase beta-blocker due to hypotension/borderline blood pressure.  Patient was seen by EP for discussion of AV nodal ablation to allow for rate control and improvement of cardiomyopathy.  It was felt patient was not a good candidate at that time.  Patient's heart rates have been fairly reasonable with increased dose of metoprolol.  Outpatient follow-up with EP.  4.  Elevated troponin Likely secondary to demand ischemia secondary to CHF exacerbation.  Initial troponin seemed to be slowly increasing however started to decrease.  2D echo with EF of 30-35%, diffuse hypokinesis and dyskinesis and basal and mid inferior septal, anterior septal and apical septal and anterior wall walls.  Severely thickened severely calcified aortic valve with moderate to severe aortic stenosis. Patient maintained on beta-blocker and diuretics. Patient seen by cardiology who recommended outpatient left and right heart catheterization.  5.  Diabetes mellitus 2 Discontinued Amaryl.  Patient maintained on sliding scale insulin.   6.   Hypertension Blood pressure somewhat borderline during the hospitalization.  She noted to have hypotension earlier on during hospital stay with some dizzy spells.  Maxzide was held.  Patient unable to tolerate amiodarone. Patient maintained on metoprolol for rate control however unable to increase due to borderline blood pressure issues.  Patient was on IV diuretics during the hospitalization and subsequently transitioned from IV diuretics to oral diuretics per cardiology for volume overload. Outpatient follow up.  7.  Hypothyroidism TSH within normal limits.  Continued on home regimen of Synthroid.  Repeat thyroid function studies in 4-6 weeks.  8.  Morbid obesity  9.  Anxiety Continued on home regimen of Effexor and Xanax as needed.    10.  Moderate to severe aortic stenosis Outpatient follow-up per cardiology.  It was felt patient's A. fib rate needed to be controlled first.      Procedures:  2D echo 06/23/2017  Chest x-ray 06/22/2017      Consultations:  Cardiology: Dr. Percival Spanish 06/22/2017  Electrophysiology       Discharge Exam: Vitals:   06/26/17 0837 06/26/17 1130  BP: 105/70 110/68  Pulse:  99  Resp:    Temp:  (!) 97.2 F (36.2 C)  SpO2: 96% 90%    General: NAD Cardiovascular: Irregularly irregular Respiratory: CTAB  Discharge Instructions   Discharge Instructions    Diet - low sodium heart healthy   Complete by:  As directed    Fluid restriction 1500cc/day   Increase activity slowly   Complete by:  As directed  Allergies as of 06/26/2017      Reactions   Coreg [carvedilol] Other (See Comments)   Weak legs   Relafen [nabumetone] Other (See Comments)   Upset stomach   Amiodarone Other (See Comments)   Patient reported intolerance to Amiodarone with worsened tremor and stopped pta. (hand tremors)   Atorvastatin    Myalgias   Calcium Other (See Comments)   unknown   Codeine Other (See Comments)   unknown   Rofecoxib Other (See  Comments)   unknown   Simvastatin    Myalgias   Enalapril Maleate Other (See Comments)   REACTION: cough      Medication List    STOP taking these medications   amiodarone 200 MG tablet Commonly known as:  PACERONE   glimepiride 1 MG tablet Commonly known as:  AMARYL   losartan 100 MG tablet Commonly known as:  COZAAR   triamterene-hydrochlorothiazide 37.5-25 MG tablet Commonly known as:  MAXZIDE-25     TAKE these medications   acetaminophen 325 MG tablet Commonly known as:  TYLENOL Take 2 tablets (650 mg total) by mouth every 6 (six) hours as needed for mild pain (or Fever >/= 101).   allopurinol 100 MG tablet Commonly known as:  ZYLOPRIM TAKE 1 TABLET EVERY DAY What changed:    how much to take  how to take this  when to take this   b complex vitamins tablet Take 1 tablet by mouth daily.   Cholecalciferol 1000 units tablet Take 1,000 Units by mouth daily.   ELIQUIS 5 MG Tabs tablet Generic drug:  apixaban TAKE 1 TABLET TWICE DAILY   furosemide 40 MG tablet Commonly known as:  LASIX Take 1 tablet (40 mg total) by mouth 2 (two) times daily. What changed:    medication strength  when to take this   HYDROcodone-acetaminophen 5-325 MG tablet Commonly known as:  NORCO/VICODIN Take 1 tablet by mouth every 8 (eight) hours as needed for moderate pain.   levothyroxine 75 MCG tablet Commonly known as:  SYNTHROID, LEVOTHROID TAKE 1 TABLET EVERY DAY   LORazepam 0.5 MG tablet Commonly known as:  ATIVAN Take 1 tablet (0.5 mg total) by mouth 2 (two) times daily. What changed:  when to take this   metFORMIN 750 MG 24 hr tablet Commonly known as:  GLUCOPHAGE-XR Take 1 tablet (750 mg total) by mouth daily with breakfast.   metoprolol tartrate 50 MG tablet Commonly known as:  LOPRESSOR Take 1 tablet (50 mg total) by mouth 2 (two) times daily.   multivitamin-iron-minerals-folic acid chewable tablet Chew 1 tablet by mouth daily.   OCUVITE PRESERVISION  Tabs Take 1 tablet by mouth 2 (two) times daily.   omeprazole 40 MG capsule Commonly known as:  PRILOSEC TAKE 1 CAPSULE EVERY DAY What changed:    how much to take  how to take this  when to take this   potassium chloride SA 20 MEQ tablet Commonly known as:  K-DUR,KLOR-CON Take 1 tablet (20 mEq total) by mouth daily.   temazepam 15 MG capsule Commonly known as:  RESTORIL TAKE 1 CAPSULE AT BEDTIME AS NEEDED  FOR  SLEEP   tetrahydrozoline 0.05 % ophthalmic solution Place 1 drop into both eyes daily.   venlafaxine XR 75 MG 24 hr capsule Commonly known as:  EFFEXOR-XR TAKE 1 CAPSULE EVERY DAY WITH BREAKFAST What changed:  See the new instructions.      Allergies  Allergen Reactions  . Coreg [Carvedilol] Other (See Comments)  Weak legs  . Relafen [Nabumetone] Other (See Comments)    Upset stomach  . Amiodarone Other (See Comments)    Patient reported intolerance to Amiodarone with worsened tremor and stopped pta. (hand tremors)  . Atorvastatin     Myalgias  . Calcium Other (See Comments)    unknown  . Codeine Other (See Comments)    unknown  . Rofecoxib Other (See Comments)    unknown  . Simvastatin     Myalgias  . Enalapril Maleate Other (See Comments)    REACTION: cough   Follow-up Information    [facility] Follow up.   Specialty:  Cardiology Why:  Atrial Fib Clinic at [facility] - 07/03/17 at 10:30am with Roderic Palau. Arrive 15 minutes prior to appointment to check in. Call the office number for directions and garage code. Contact information: [address] [id number] mc [address] [place] [address] [phone]       Plotnikov, Evie Lacks, MD Follow up.   Specialty:  Internal Medicine Why:  f/u as scheduled. Contact information: [place] [address] [phone]        Skeet Latch, MD .   Specialty:  Cardiology Contact information: [address] [place]  [place] [address] [phone]            The results of significant diagnostics from this hospitalization (including imaging, microbiology, ancillary and laboratory) are listed below for reference.    Significant Diagnostic Studies: Dg Chest 2 View  Result Date: 06/22/2017 CLINICAL DATA:  Shortness of breath.  Atrial fibrillation EXAM: CHEST - 2 VIEW COMPARISON:  April 23, 2017 FINDINGS: There is moderate interstitial pulmonary edema throughout the lungs. There is patchy airspace opacity in the lung bases. There is no appreciable pleural effusion. There is cardiomegaly with pulmonary venous hypertension. Pacemaker leads are attached to the right atrium and right ventricle. There is a hiatal hernia. No adenopathy. There is degenerative change in the thoracic spine. IMPRESSION: Moderate interstitial edema. Patchy alveolar opacity in the bases may represent alveolar edema, although there may be superimposed pneumonia in the bases. There is cardiomegaly with pulmonary venous hypertension. Pacemaker leads attached to right atrium and right ventricle. There is a focal hiatal hernia. Electronically Signed   By: Lowella Grip III M.D.   On: 06/22/2017 09:52    Microbiology: Recent Results (from the past 240 hour(s))  Urine Culture     Status: Abnormal   Collection Time: 06/23/17  2:41 PM  Result Value Ref Range Status   Specimen Description URINE, RANDOM  Final   Special Requests NONE  Final   Culture (A)  Final    <10,000 COLONIES/mL INSIGNIFICANT GROWTH Performed at [facility], [address], [place], [place] [address]    Report Status 06/24/2017 FINAL  Final     Labs: Basic Metabolic Panel: Recent Labs  Lab 06/22/17 1012 06/22/17 1252 06/23/17 0948 06/24/17 0725 06/25/17 0531 06/26/17 0516  NA 134*  --  133* 137 134* 137  K 3.9  --  3.5 3.2* 4.1 3.8  CL 100*  --  98* 98* 97* 99*  CO2 21*  --  22 26 21* 24  GLUCOSE 173*  --  299* 149* 193* 148*  BUN 15  --  20 21* 26*  26*  CREATININE 0.89 0.94 1.08* 0.90 0.96 0.91  CALCIUM 9.0 8.9 8.7* 8.9 9.1 9.3  MG  --  2.2 2.1  --  2.2  --  PHOS  --  2.9  --   --   --   --    Liver Function Tests: Recent Labs  Lab 06/22/17 1012  AST 24  ALT 15  ALKPHOS 65  BILITOT 1.1  PROT 7.6  ALBUMIN 3.4*   No results for input(s): LIPASE, AMYLASE in the last 168 hours. No results for input(s): AMMONIA in the last 168 hours. CBC: Recent Labs  Lab 06/22/17 1012 06/22/17 1252 06/23/17 0006 06/24/17 0725 06/25/17 0531 06/26/17 0516  WBC 9.1 9.1 8.8 8.2 9.0 7.5  NEUTROABS 7.1  --   --   --   --   --   HGB 12.6 12.7 12.3 12.8 13.5 13.2  HCT 38.9 38.2 37.4 39.2 40.3 40.2  MCV 94.4 94.8 94.9 95.4 94.2 93.5  PLT 277 283 301 327 347 360   Cardiac Enzymes: Recent Labs  Lab 06/22/17 1012 06/22/17 1252 06/22/17 1840 06/23/17 0006  TROPONINI 0.10* 0.13* 0.15* 0.12*   BNP: BNP (last 3 results) Recent Labs    06/22/17 1252 06/23/17 0006 06/24/17 0725  BNP 360.3* 335.0* 436.1*    ProBNP (last 3 results) No results for input(s): PROBNP in the last 8760 hours.  CBG: Recent Labs  Lab 06/25/17 1245 06/25/17 1631 06/25/17 2026 06/26/17 0744 06/26/17 1127  GLUCAP 169* 210* 162* 121* 175*       Signed:  Irine Seal MD.  [facility] 06/26/2017, 2:39 PM

## 2017-06-27 ENCOUNTER — Telehealth: Payer: Self-pay | Admitting: *Deleted

## 2017-06-27 ENCOUNTER — Other Ambulatory Visit: Payer: Self-pay

## 2017-06-27 NOTE — Telephone Encounter (Signed)
Pt was on TCM report admitted 06/22/17 for SOB sxs. Proceeded w/testing Patient was hemodynamically stable in ED pulse 59, blood pressure was low on admission 84/67, has improved to 90/72.  Labs within normal limits, elevated troponin of 0.10, proBNP 291. Chest x-ray reviewed: Consistent with moderate interstitial edema, congestion, cardiomegaly. Pacemaker leads are identified in the right atrium and right ventricleThere is cardiomegaly with pulmonary venous hypertension. Pt D/C 06/26/17, and will f/u w/ Atrial clinic 07/03/17.Marland KitchenJohny Chess

## 2017-06-27 NOTE — Consult Note (Signed)
Late entry for 1300 06/26/17 - Patient evaluated for community based chronic disease management services with [place] as a benefit of patient's [employer]. Spoke with patient at bedside to explain [place] services.  Patient states she would be interested in a nurse following up with her.  She has a twin sister who lives with her and she is the caregiver for.  Her twin sister has dementia and she is anxious to get home and care for her. Patient's primary care provider is listed to provide the transition of care follow up.  Confirmed as Dr. Lew Dawes, [facility]. Consent form signed and copy of folder given.  Patient will receive post hospital discharge call and will be evaluated additional assessments and disease process education.    Left contact information and [facility] literature at bedside. Made Inpatient Case Manager aware that [place] [facility] following. Of note, [facility] Care Management services does not replace or interfere with any services that are arranged by inpatient case management or social work.  For additional questions or referrals please contact:    Natividad Brood, RN BSN [facility] Liaison  [phone] business mobile phone Toll free office [phone]

## 2017-06-28 ENCOUNTER — Ambulatory Visit (INDEPENDENT_AMBULATORY_CARE_PROVIDER_SITE_OTHER): Payer: Medicare HMO | Admitting: *Deleted

## 2017-06-28 DIAGNOSIS — I441 Atrioventricular block, second degree: Secondary | ICD-10-CM

## 2017-06-29 NOTE — Progress Notes (Signed)
Remote pacemaker transmission.   

## 2017-07-02 ENCOUNTER — Encounter: Payer: Self-pay | Admitting: Cardiology

## 2017-07-03 ENCOUNTER — Encounter (HOSPITAL_COMMUNITY): Payer: Self-pay | Admitting: Nurse Practitioner

## 2017-07-03 ENCOUNTER — Ambulatory Visit (HOSPITAL_COMMUNITY)
Admission: RE | Admit: 2017-07-03 | Discharge: 2017-07-03 | Disposition: A | Discharge status: Home / Self Care | Payer: Medicare HMO | Source: Ambulatory Visit | Attending: Nurse Practitioner | Admitting: Nurse Practitioner

## 2017-07-03 VITALS — BP 94/72 | HR 133 | Ht 62.0 in | Wt 251.0 lb

## 2017-07-03 DIAGNOSIS — I5033 Acute on chronic diastolic (congestive) heart failure: Secondary | ICD-10-CM

## 2017-07-03 DIAGNOSIS — I110 Hypertensive heart disease with heart failure: Secondary | ICD-10-CM | POA: Insufficient documentation

## 2017-07-03 DIAGNOSIS — F419 Anxiety disorder, unspecified: Secondary | ICD-10-CM | POA: Insufficient documentation

## 2017-07-03 DIAGNOSIS — Z86711 Personal history of pulmonary embolism: Secondary | ICD-10-CM | POA: Diagnosis not present

## 2017-07-03 DIAGNOSIS — I447 Left bundle-branch block, unspecified: Secondary | ICD-10-CM | POA: Diagnosis not present

## 2017-07-03 DIAGNOSIS — E1140 Type 2 diabetes mellitus with diabetic neuropathy, unspecified: Secondary | ICD-10-CM | POA: Diagnosis not present

## 2017-07-03 DIAGNOSIS — Z86718 Personal history of other venous thrombosis and embolism: Secondary | ICD-10-CM | POA: Diagnosis not present

## 2017-07-03 DIAGNOSIS — K219 Gastro-esophageal reflux disease without esophagitis: Secondary | ICD-10-CM | POA: Diagnosis not present

## 2017-07-03 DIAGNOSIS — I248 Other forms of acute ischemic heart disease: Secondary | ICD-10-CM | POA: Insufficient documentation

## 2017-07-03 DIAGNOSIS — Z7984 Long term (current) use of oral hypoglycemic drugs: Secondary | ICD-10-CM | POA: Diagnosis not present

## 2017-07-03 DIAGNOSIS — E669 Obesity, unspecified: Secondary | ICD-10-CM | POA: Insufficient documentation

## 2017-07-03 DIAGNOSIS — I5043 Acute on chronic combined systolic (congestive) and diastolic (congestive) heart failure: Secondary | ICD-10-CM

## 2017-07-03 DIAGNOSIS — F329 Major depressive disorder, single episode, unspecified: Secondary | ICD-10-CM | POA: Insufficient documentation

## 2017-07-03 DIAGNOSIS — Z950 Presence of cardiac pacemaker: Secondary | ICD-10-CM | POA: Diagnosis not present

## 2017-07-03 DIAGNOSIS — I480 Paroxysmal atrial fibrillation: Secondary | ICD-10-CM | POA: Diagnosis not present

## 2017-07-03 DIAGNOSIS — M1990 Unspecified osteoarthritis, unspecified site: Secondary | ICD-10-CM | POA: Diagnosis not present

## 2017-07-03 DIAGNOSIS — Z79890 Hormone replacement therapy (postmenopausal): Secondary | ICD-10-CM | POA: Insufficient documentation

## 2017-07-03 DIAGNOSIS — Z833 Family history of diabetes mellitus: Secondary | ICD-10-CM | POA: Insufficient documentation

## 2017-07-03 DIAGNOSIS — E785 Hyperlipidemia, unspecified: Secondary | ICD-10-CM | POA: Insufficient documentation

## 2017-07-03 DIAGNOSIS — Z885 Allergy status to narcotic agent status: Secondary | ICD-10-CM | POA: Diagnosis not present

## 2017-07-03 DIAGNOSIS — I5042 Chronic combined systolic (congestive) and diastolic (congestive) heart failure: Secondary | ICD-10-CM | POA: Diagnosis not present

## 2017-07-03 DIAGNOSIS — Z7901 Long term (current) use of anticoagulants: Secondary | ICD-10-CM | POA: Insufficient documentation

## 2017-07-03 DIAGNOSIS — E039 Hypothyroidism, unspecified: Secondary | ICD-10-CM | POA: Diagnosis not present

## 2017-07-03 DIAGNOSIS — Z79899 Other long term (current) drug therapy: Secondary | ICD-10-CM | POA: Diagnosis not present

## 2017-07-03 DIAGNOSIS — I4892 Unspecified atrial flutter: Secondary | ICD-10-CM | POA: Insufficient documentation

## 2017-07-03 DIAGNOSIS — Z889 Allergy status to unspecified drugs, medicaments and biological substances status: Secondary | ICD-10-CM | POA: Insufficient documentation

## 2017-07-03 DIAGNOSIS — I5032 Chronic diastolic (congestive) heart failure: Secondary | ICD-10-CM

## 2017-07-03 DIAGNOSIS — Z9049 Acquired absence of other specified parts of digestive tract: Secondary | ICD-10-CM | POA: Insufficient documentation
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
## 2017-07-03 NOTE — Patient Instructions (Addendum)
AV Node Ablation scheduled for Thursday, April 4th  - Arrive at the [facility] and go to admitting at 2:45pm  -Do not eat after midnight the night prior to your procedure.  -You may have CLEAR LIQUIDS until 10am morning of procedure.  - Do not take your eliquis on the morning of the procedure.  - Take metoprolol and synthroid with a sip of water.  Follow up will be made at discharge.

## 2017-07-04 ENCOUNTER — Other Ambulatory Visit: Payer: Self-pay | Admitting: Internal Medicine

## 2017-07-05 ENCOUNTER — Encounter (HOSPITAL_COMMUNITY)
Admission: RE | Disposition: A | Discharge status: Home / Self Care | Payer: Self-pay | Source: Ambulatory Visit | Attending: Internal Medicine

## 2017-07-05 ENCOUNTER — Ambulatory Visit (HOSPITAL_COMMUNITY)
Admission: RE | Admit: 2017-07-05 | Discharge: 2017-07-06 | Disposition: A | Discharge status: Home / Self Care | Payer: Medicare HMO | Source: Ambulatory Visit | Attending: Internal Medicine | Admitting: Internal Medicine

## 2017-07-05 DIAGNOSIS — F329 Major depressive disorder, single episode, unspecified: Secondary | ICD-10-CM | POA: Insufficient documentation

## 2017-07-05 DIAGNOSIS — Z86711 Personal history of pulmonary embolism: Secondary | ICD-10-CM | POA: Insufficient documentation

## 2017-07-05 DIAGNOSIS — I484 Atypical atrial flutter: Secondary | ICD-10-CM | POA: Diagnosis not present

## 2017-07-05 DIAGNOSIS — I4892 Unspecified atrial flutter: Secondary | ICD-10-CM

## 2017-07-05 DIAGNOSIS — E1142 Type 2 diabetes mellitus with diabetic polyneuropathy: Secondary | ICD-10-CM | POA: Insufficient documentation

## 2017-07-05 DIAGNOSIS — Z885 Allergy status to narcotic agent status: Secondary | ICD-10-CM | POA: Diagnosis not present

## 2017-07-05 DIAGNOSIS — Z950 Presence of cardiac pacemaker: Secondary | ICD-10-CM | POA: Insufficient documentation

## 2017-07-05 DIAGNOSIS — I5042 Chronic combined systolic (congestive) and diastolic (congestive) heart failure: Secondary | ICD-10-CM | POA: Diagnosis not present

## 2017-07-05 DIAGNOSIS — Z7901 Long term (current) use of anticoagulants: Secondary | ICD-10-CM | POA: Insufficient documentation

## 2017-07-05 DIAGNOSIS — I110 Hypertensive heart disease with heart failure: Secondary | ICD-10-CM | POA: Insufficient documentation

## 2017-07-05 DIAGNOSIS — K219 Gastro-esophageal reflux disease without esophagitis: Secondary | ICD-10-CM | POA: Diagnosis not present

## 2017-07-05 DIAGNOSIS — Z86718 Personal history of other venous thrombosis and embolism: Secondary | ICD-10-CM | POA: Insufficient documentation

## 2017-07-05 DIAGNOSIS — I4891 Unspecified atrial fibrillation: Secondary | ICD-10-CM | POA: Diagnosis not present

## 2017-07-05 DIAGNOSIS — Z6841 Body Mass Index (BMI) 40.0 and over, adult: Secondary | ICD-10-CM | POA: Insufficient documentation

## 2017-07-05 DIAGNOSIS — E039 Hypothyroidism, unspecified: Secondary | ICD-10-CM | POA: Diagnosis not present

## 2017-07-05 DIAGNOSIS — E669 Obesity, unspecified: Secondary | ICD-10-CM | POA: Diagnosis not present

## 2017-07-05 DIAGNOSIS — F419 Anxiety disorder, unspecified: Secondary | ICD-10-CM | POA: Diagnosis not present

## 2017-07-05 DIAGNOSIS — M1990 Unspecified osteoarthritis, unspecified site: Secondary | ICD-10-CM | POA: Diagnosis not present

## 2017-07-05 DIAGNOSIS — E785 Hyperlipidemia, unspecified: Secondary | ICD-10-CM | POA: Diagnosis not present

## 2017-07-05 DIAGNOSIS — I482 Chronic atrial fibrillation: Secondary | ICD-10-CM | POA: Insufficient documentation

## 2017-07-05 DIAGNOSIS — Z8249 Family history of ischemic heart disease and other diseases of the circulatory system: Secondary | ICD-10-CM | POA: Insufficient documentation

## 2017-07-05 DIAGNOSIS — J329 Chronic sinusitis, unspecified: Secondary | ICD-10-CM | POA: Diagnosis not present

## 2017-07-05 DIAGNOSIS — Z7984 Long term (current) use of oral hypoglycemic drugs: Secondary | ICD-10-CM | POA: Insufficient documentation

## 2017-07-05 DIAGNOSIS — I428 Other cardiomyopathies: Secondary | ICD-10-CM | POA: Diagnosis not present

## 2017-07-05 HISTORY — PX: AV NODE ABLATION: EP1193

## 2017-07-05 LAB — GLUCOSE, CAPILLARY
GLUCOSE-CAPILLARY: 145 mg/dL — AB (ref 65–99)
Glucose-Capillary: 191 mg/dL — ABNORMAL HIGH (ref 65–99)

## 2017-07-05 SURGERY — AV NODE ABLATION

## 2017-07-05 MED ORDER — METFORMIN HCL ER 750 MG PO TB24
750.0000 mg | ORAL_TABLET | Freq: Every day | ORAL | Status: DC
  Administered 2017-07-06: 750 mg via ORAL
  Filled 2017-07-05: qty 1

## 2017-07-05 MED ORDER — POTASSIUM CHLORIDE CRYS ER 20 MEQ PO TBCR
20.0000 meq | EXTENDED_RELEASE_TABLET | Freq: Every day | ORAL | Status: DC
  Administered 2017-07-06: 20 meq via ORAL
  Filled 2017-07-05: qty 1

## 2017-07-05 MED ORDER — LORAZEPAM 0.5 MG PO TABS
0.5000 mg | ORAL_TABLET | Freq: Every day | ORAL | Status: DC
  Administered 2017-07-05: 0.5 mg via ORAL
  Filled 2017-07-05: qty 1

## 2017-07-05 MED ORDER — VENLAFAXINE HCL ER 75 MG PO CP24
75.0000 mg | ORAL_CAPSULE | Freq: Every day | ORAL | Status: DC
  Administered 2017-07-06: 75 mg via ORAL
  Filled 2017-07-05: qty 1

## 2017-07-05 MED ORDER — ONDANSETRON HCL 4 MG/2ML IJ SOLN: 4.0000 mg | Freq: Four times a day (QID) | INTRAMUSCULAR | Status: DC | PRN

## 2017-07-05 MED ORDER — SODIUM CHLORIDE 0.9 % IV SOLN: 250.0000 mL | INTRAVENOUS | Status: DC | PRN

## 2017-07-05 MED ORDER — SODIUM CHLORIDE 0.9% FLUSH: 3.0000 mL | INTRAVENOUS | Status: DC | PRN

## 2017-07-05 MED ORDER — LEVOTHYROXINE SODIUM 75 MCG PO TABS
75.0000 ug | ORAL_TABLET | Freq: Every day | ORAL | Status: DC
  Administered 2017-07-06: 75 ug via ORAL
  Filled 2017-07-05: qty 1

## 2017-07-05 MED ORDER — APIXABAN 5 MG PO TABS
5.0000 mg | ORAL_TABLET | Freq: Two times a day (BID) | ORAL | Status: DC
  Administered 2017-07-05 – 2017-07-06 (×2): 5 mg via ORAL
  Filled 2017-07-05 (×2): qty 1

## 2017-07-05 MED ORDER — TEMAZEPAM 7.5 MG PO CAPS: 15.0000 mg | ORAL_CAPSULE | Freq: Every evening | ORAL | Status: DC | PRN

## 2017-07-05 MED ORDER — BUPIVACAINE HCL (PF) 0.25 % IJ SOLN
INTRAMUSCULAR | Status: DC | PRN
  Administered 2017-07-05: 30 mL

## 2017-07-05 MED ORDER — ALLOPURINOL 100 MG PO TABS
100.0000 mg | ORAL_TABLET | Freq: Every day | ORAL | Status: DC
Start: 2017-07-05 — End: 2017-07-06
  Administered 2017-07-06: 100 mg via ORAL
  Filled 2017-07-05 (×2): qty 1

## 2017-07-05 MED ORDER — MIDAZOLAM HCL 5 MG/5ML IJ SOLN
INTRAMUSCULAR | Status: AC
  Filled 2017-07-05: qty 5

## 2017-07-05 MED ORDER — ACETAMINOPHEN 325 MG PO TABS: 650.0000 mg | ORAL_TABLET | ORAL | Status: DC | PRN

## 2017-07-05 MED ORDER — HYDROCODONE-ACETAMINOPHEN 5-325 MG PO TABS: 1.0000 | ORAL_TABLET | Freq: Three times a day (TID) | ORAL | Status: DC | PRN

## 2017-07-05 MED ORDER — METOPROLOL TARTRATE 25 MG PO TABS
25.0000 mg | ORAL_TABLET | Freq: Two times a day (BID) | ORAL | Status: DC
  Administered 2017-07-05 – 2017-07-06 (×2): 25 mg via ORAL
  Filled 2017-07-05 (×2): qty 1

## 2017-07-05 MED ORDER — SODIUM CHLORIDE 0.9% FLUSH
3.0000 mL | Freq: Two times a day (BID) | INTRAVENOUS | Status: DC
  Administered 2017-07-05 – 2017-07-06 (×2): 3 mL via INTRAVENOUS

## 2017-07-05 MED ORDER — FUROSEMIDE 40 MG PO TABS
40.0000 mg | ORAL_TABLET | Freq: Two times a day (BID) | ORAL | Status: DC
  Administered 2017-07-06: 40 mg via ORAL
  Filled 2017-07-05: qty 1

## 2017-07-05 MED ORDER — MIDAZOLAM HCL 5 MG/5ML IJ SOLN
INTRAMUSCULAR | Status: DC | PRN
  Administered 2017-07-05: 1 mg via INTRAVENOUS

## 2017-07-05 SURGICAL SUPPLY — 5 items
CATH CELSIUS THERM D CV 7F (ABLATOR) ×2 IMPLANT
PACK EP LATEX FREE (CUSTOM PROCEDURE TRAY) ×3
PACK EP LF (CUSTOM PROCEDURE TRAY) ×1 IMPLANT
PAD DEFIB LIFELINK (PAD) ×3 IMPLANT
SHEATH AVANTI 11CM 8FR (SHEATH) ×2 IMPLANT

## 2017-07-05 NOTE — Discharge Instructions (Signed)
Post procedure care instructions No driving for 4 days. No lifting over 5 lbs for 1 week. No vigorous or sexual activity for 1 week. You may return to work in 1 week. Keep procedure site clean & dry. If you notice increased pain, swelling, bleeding or pus, call/return!  You may shower, but no soaking baths/hot tubs/pools for 1 week.

## 2017-07-05 NOTE — Progress Notes (Signed)
Site area: Right groin a 8 french venous sheath was removed  Site Prior to Removal:  Level 0  Pressure Applied For 20 MINUTES    Bedrest Beginning at 1616p  Manual:   Yes.    Patient Status During Pull:  stable  Post Pull Groin Site:  Level 0  Post Pull Instructions Given:  Yes.    Post Pull Pulses Present:  Yes.    Dressing Applied:  Yes.    Comments:  VS remain stable

## 2017-07-05 NOTE — Interval H&P Note (Signed)
History and Physical Interval Note:  07/05/2017 1:47 PM   Jessica [name]  has presented today for surgery, with the diagnosis of afib with RVR.  The various methods of treatment have been discussed with the patient and family. After consideration of risks, benefits and other options for treatment, the patient has consented to  Procedure(s): AV NODE ABLATION (N/A) as a surgical intervention .  The patient's history has been reviewed, patient examined, no change in status, stable for surgery.  I have reviewed the patient's chart and labs.  Questions were answered to the patient's satisfaction.     I have seen, examined the patient, and reviewed the above assessment and plan.  Changes to above are made where necessary.  The patient has medicine refractory atrial fibrillation.  She has low BP which limits medical options.  I agree with Ms Kayleen Memos NP that AV nodal ablation is our best option at this time. I had a long discussion with the patient about risks and benefits to AV nodal ablation. She understands that she will be device dependant afterwards.  She also understands that she may eventually require upgrade to CRT-P if her EF does not recover.  She understands risks and wishes to proceed at this time.  Co Sign: [name], MD 07/05/2017 1:47 PM    [name]

## 2017-07-05 NOTE — Discharge Summary (Signed)
ELECTROPHYSIOLOGY PROCEDURE DISCHARGE SUMMARY    Patient ID: Jessica [name],  MRN: [id number], DOB/AGE: 1934-01-14 84 y.o.  Admit date: 07/05/2017 Discharge date: 07/06/17  Primary Care Physician: [name], MD  Primary Cardiologist: Dr. Oval Linsey Electrophysiologist: Dr. Rayann Heman  Primary Discharge Diagnosis:  1. Permanent AFib w/uncontroled rates     CHA2DS2Vasc is 5, on Eliquis     S/p AVNode ablation this admission      Secondary Discharge Diagnosis:  1. Hx of High AV block w/PPM 2. VHD     Mod AS 3. HTN 4. DM 5. Hypothyroidism 6. Chronic CHF (systolic/diastolic) 7. NICM  Allergies  Allergen Reactions  . Coreg [Carvedilol] Other (See Comments)    Weak legs  . Relafen [Nabumetone] Other (See Comments)    Upset stomach  . Amiodarone Other (See Comments)    Patient reported intolerance to Amiodarone with worsened tremor and stopped pta. (hand tremors)  . Atorvastatin     Myalgias  . Calcium Other (See Comments)    unknown  . Codeine Other (See Comments)    unknown  . Rofecoxib Other (See Comments)    unknown  . Simvastatin     Myalgias  . Enalapril Maleate Other (See Comments)    REACTION: cough     Procedures This Admission: 1.  Electrophysiology study and radiofrequency catheter ablation of AV node on 07/05/17 by [name].   This study demonstrated CONCLUSIONS:  1. Atypical atrial flutter with RVR upon presentation.  2. Successful AV nodal ablation 3. No early apparent complications.    Brief HPI: Jessica [name] is a 84 y.o. female with a past medical history as outlined above.  She has permanent AFib with uncontrolled rates.   Risks, benefits, and alternatives to AV node ablation were reviewed with the patient who wished to proceed.   Hospital Course:  The patient was admitted and underwent EPS/RFCA with details as outlined above. She was monitored on telemetry overnight which demonstrated V paced at 80 bpm.   R groin procedure site  was without complication.  The patient was examined by Dr.  Rayann Heman and considered stable for discharge to home.  Follow up will be arranged in 2 weeks for device reprogramming.   Site care and activity restrictions were reviewed with the patient prior to discharge.   Physical Exam: Vitals:   07/05/17 1700 07/05/17 1800 07/05/17 2248 07/06/17 0432  BP: 101/66 114/84 106/79 120/81  Pulse: 79 79  80  Resp: (!) 22 19 17  (!) 21  Temp:    98.7 F (37.1 C)  TempSrc:    Oral  SpO2: 97% 100%  97%  Weight:      Height:        GEN- The patient is well appearing, alert and oriented x 3 today.   HEENT: normocephalic, atraumatic; sclera clear, conjunctiva pink; hearing intact; oropharynx clear; neck supple, no JVP Lymph- no cervical lymphadenopathy Lungs-  CTA b/l, normal work of breathing.  No wheezes, rales, rhonchi Heart-  RRR (paced) GI- soft, non-tender, non-distended Extremities- no clubbing, cyanosis, or edema; DP/PT pulses 2+ bilaterally,   R groin without hematoma/bruit MS- no significant deformity or atrophy Skin- warm and dry, no rash or lesion Psych- euthymic mood, full affect Neuro- strength and sensation are intact   Discharge Vitals:  Vitals:   07/05/17 2248 07/06/17 0432  BP: 106/79 120/81  Pulse:  80  Resp: 17 (!) 21  Temp:  98.7 F (37.1 C)  SpO2:  97%  Labs:   Lab Results  Component Value Date   WBC 7.5 06/26/2017   HGB 13.2 06/26/2017   HCT 40.2 06/26/2017   MCV 93.5 06/26/2017   PLT 360 06/26/2017   No results for input(s): NA, K, CL, CO2, BUN, CREATININE, CALCIUM, PROT, BILITOT, ALKPHOS, ALT, AST, GLUCOSE in the last 168 hours.  Invalid input(s): LABALBU  Discharge Medications:  Allergies as of 07/06/2017      Reactions   Coreg [carvedilol] Other (See Comments)   Weak legs   Relafen [nabumetone] Other (See Comments)   Upset stomach   Amiodarone Other (See Comments)   Patient reported intolerance to Amiodarone with worsened tremor and stopped pta.  (hand tremors)   Atorvastatin    Myalgias   Calcium Other (See Comments)   unknown   Codeine Other (See Comments)   unknown   Rofecoxib Other (See Comments)   unknown   Simvastatin    Myalgias   Enalapril Maleate Other (See Comments)   REACTION: cough      Medication List    TAKE these medications   acetaminophen 325 MG tablet Commonly known as:  TYLENOL Take 2 tablets (650 mg total) by mouth every 6 (six) hours as needed for mild pain (or Fever >/= 101).   allopurinol 100 MG tablet Commonly known as:  ZYLOPRIM Take 100 mg by mouth daily.   b complex vitamins tablet Take 1 tablet by mouth daily.   Cholecalciferol 1000 units tablet Take 1,000 Units by mouth daily.   ELIQUIS 5 MG Tabs tablet Generic drug:  apixaban TAKE 1 TABLET TWICE DAILY   furosemide 40 MG tablet Commonly known as:  LASIX Take 1 tablet (40 mg total) by mouth 2 (two) times daily.   HYDROcodone-acetaminophen 5-325 MG tablet Commonly known as:  NORCO/VICODIN Take 1 tablet by mouth every 8 (eight) hours as needed for moderate pain.   levothyroxine 75 MCG tablet Commonly known as:  SYNTHROID, LEVOTHROID TAKE 1 TABLET EVERY DAY   LORazepam 0.5 MG tablet Commonly known as:  ATIVAN Take 1 tablet (0.5 mg total) by mouth 2 (two) times daily. What changed:  when to take this   metFORMIN 750 MG 24 hr tablet Commonly known as:  GLUCOPHAGE-XR Take 1 tablet (750 mg total) by mouth daily with breakfast.   metoprolol tartrate 50 MG tablet Commonly known as:  LOPRESSOR Take 0.5 tablets (25 mg total) by mouth 2 (two) times daily. What changed:  how much to take   multivitamin-iron-minerals-folic acid chewable tablet Chew 1 tablet by mouth daily.   OCUVITE PRESERVISION Tabs Take 1 tablet by mouth 2 (two) times daily.   omeprazole 40 MG capsule Commonly known as:  PRILOSEC TAKE 1 CAPSULE EVERY DAY What changed:    how much to take  how to take this  when to take this   potassium chloride SA  20 MEQ tablet Commonly known as:  K-DUR,KLOR-CON Take 1 tablet (20 mEq total) by mouth daily.   temazepam 15 MG capsule Commonly known as:  RESTORIL TAKE 1 CAPSULE AT BEDTIME AS NEEDED  FOR  SLEEP   tetrahydrozoline 0.05 % ophthalmic solution Place 1 drop into both eyes daily.   venlafaxine XR 75 MG 24 hr capsule Commonly known as:  EFFEXOR-XR TAKE 1 CAPSULE EVERY DAY WITH BREAKFAST What changed:  See the new instructions.       Disposition:  Home   Follow-up Information    [name], MD Follow up on 07/16/2017.   Specialty:  Cardiology  Why:  4:30PM Contact information: [address] [place] [address] [phone]           Duration of Discharge Encounter: Greater than 30 minutes including physician time.  Army Fossa MD 07/06/2017 8:06 AM

## 2017-07-06 ENCOUNTER — Other Ambulatory Visit: Payer: Self-pay

## 2017-07-06 ENCOUNTER — Encounter (HOSPITAL_COMMUNITY): Payer: Self-pay

## 2017-07-06 ENCOUNTER — Other Ambulatory Visit (HOSPITAL_COMMUNITY): Payer: Self-pay | Admitting: Neurology

## 2017-07-06 DIAGNOSIS — I482 Chronic atrial fibrillation: Secondary | ICD-10-CM | POA: Diagnosis not present

## 2017-07-06 DIAGNOSIS — I5042 Chronic combined systolic (congestive) and diastolic (congestive) heart failure: Secondary | ICD-10-CM | POA: Diagnosis not present

## 2017-07-06 DIAGNOSIS — Z6841 Body Mass Index (BMI) 40.0 and over, adult: Secondary | ICD-10-CM | POA: Diagnosis not present

## 2017-07-06 DIAGNOSIS — I110 Hypertensive heart disease with heart failure: Secondary | ICD-10-CM | POA: Diagnosis not present

## 2017-07-06 DIAGNOSIS — I4892 Unspecified atrial flutter: Secondary | ICD-10-CM | POA: Diagnosis not present

## 2017-07-06 DIAGNOSIS — I4891 Unspecified atrial fibrillation: Secondary | ICD-10-CM

## 2017-07-06 DIAGNOSIS — E1142 Type 2 diabetes mellitus with diabetic polyneuropathy: Secondary | ICD-10-CM | POA: Diagnosis not present

## 2017-07-06 DIAGNOSIS — I428 Other cardiomyopathies: Secondary | ICD-10-CM | POA: Diagnosis not present

## 2017-07-06 DIAGNOSIS — E669 Obesity, unspecified: Secondary | ICD-10-CM | POA: Diagnosis not present

## 2017-07-06 DIAGNOSIS — E039 Hypothyroidism, unspecified: Secondary | ICD-10-CM | POA: Diagnosis not present

## 2017-07-06 DIAGNOSIS — I484 Atypical atrial flutter: Secondary | ICD-10-CM | POA: Diagnosis not present

## 2017-07-06 MED ORDER — METOPROLOL TARTRATE 50 MG PO TABS: 25.0000 mg | ORAL_TABLET | Freq: Two times a day (BID) | ORAL | Status: DC

## 2017-07-06 NOTE — Progress Notes (Signed)
Discharge instructions given. Pt verbalized understanding and all questions were answered.  

## 2017-07-06 NOTE — Plan of Care (Signed)
  Problem: Health Behavior/Discharge Planning: Goal: Ability to manage health-related needs will improve Outcome: Progressing   Problem: Clinical Measurements: Goal: Ability to maintain clinical measurements within normal limits will improve Outcome: Progressing Goal: Respiratory complications will improve Outcome: Progressing Goal: Cardiovascular complication will be avoided Outcome: Progressing   Problem: Education: Goal: Knowledge of General Education information will improve Outcome: Completed/Met   Problem: Clinical Measurements: Goal: Will remain free from infection Outcome: Completed/Met   Problem: Coping: Goal: Level of anxiety will decrease Outcome: Completed/Met   Problem: Elimination: Goal: Will not experience complications related to bowel motility Outcome: Completed/Met Goal: Will not experience complications related to urinary retention Outcome: Completed/Met   Problem: Pain Managment: Goal: General experience of comfort will improve Outcome: Completed/Met   Problem: Safety: Goal: Ability to remain free from injury will improve Outcome: Completed/Met   Problem: Skin Integrity: Goal: Risk for impaired skin integrity will decrease Outcome: Completed/Met

## 2017-07-09 ENCOUNTER — Other Ambulatory Visit: Payer: Self-pay

## 2017-07-09 NOTE — Patient Outreach (Signed)
[facility]) Care Management  07/09/2017  TAMU GOLZ 1934/01/28 [id number]   Telephone call to patient for introduction.  Patient able to verify HIPAA.  Explained to the patient health coach role.  Patient is open to the call and verbally agreed to services.     Plan:  RN Health Coach will contact the patient in three to four business days.  Lazaro Arms RN, BSN, [place] Direct Dial:  [phone]  Fax: [phone]

## 2017-07-10 LAB — CUP PACEART REMOTE DEVICE CHECK
Battery Impedance: 280 Ohm
Battery Remaining Longevity: 116 mo
Battery Voltage: 2.79 V
Brady Statistic AP VP Percent: 0 %
Implantable Lead Implant Date: 20140710
Implantable Lead Location: 753860
Implantable Lead Model: 5092
Implantable Pulse Generator Implant Date: 20140710
Lead Channel Impedance Value: 510 Ohm
Lead Channel Pacing Threshold Pulse Width: 0.4 ms
Lead Channel Setting Pacing Amplitude: 2 V
Lead Channel Setting Pacing Amplitude: 2.5 V
Lead Channel Setting Pacing Pulse Width: 0.4 ms
Lead Channel Setting Sensing Sensitivity: 4 mV
MDC IDC LEAD IMPLANT DT: 20140710
MDC IDC LEAD LOCATION: 753859
MDC IDC MSMT LEADCHNL RA PACING THRESHOLD AMPLITUDE: 0.625 V
MDC IDC MSMT LEADCHNL RA PACING THRESHOLD PULSEWIDTH: 0.4 ms
MDC IDC MSMT LEADCHNL RV IMPEDANCE VALUE: 667 Ohm
MDC IDC MSMT LEADCHNL RV PACING THRESHOLD AMPLITUDE: 0.5 V
MDC IDC SESS DTM: 20190328155210
MDC IDC STAT BRADY AP VS PERCENT: 0 %
MDC IDC STAT BRADY AS VP PERCENT: 0 %
MDC IDC STAT BRADY AS VS PERCENT: 100 %

## 2017-07-11 ENCOUNTER — Ambulatory Visit (INDEPENDENT_AMBULATORY_CARE_PROVIDER_SITE_OTHER): Payer: Medicare HMO | Admitting: Internal Medicine

## 2017-07-11 ENCOUNTER — Encounter: Payer: Self-pay | Admitting: Internal Medicine

## 2017-07-11 DIAGNOSIS — I480 Paroxysmal atrial fibrillation: Secondary | ICD-10-CM

## 2017-07-11 DIAGNOSIS — I5043 Acute on chronic combined systolic (congestive) and diastolic (congestive) heart failure: Secondary | ICD-10-CM

## 2017-07-11 DIAGNOSIS — E118 Type 2 diabetes mellitus with unspecified complications: Secondary | ICD-10-CM

## 2017-07-11 DIAGNOSIS — I10 Essential (primary) hypertension: Secondary | ICD-10-CM | POA: Diagnosis not present

## 2017-07-11 DIAGNOSIS — I872 Venous insufficiency (chronic) (peripheral): Secondary | ICD-10-CM | POA: Diagnosis not present

## 2017-07-11 MED ORDER — TEMAZEPAM 15 MG PO CAPS: 15.0000 mg | ORAL_CAPSULE | Freq: Every evening | ORAL | 1 refills | Status: DC | PRN

## 2017-07-11 MED ORDER — LORAZEPAM 0.5 MG PO TABS: 0.5000 mg | ORAL_TABLET | Freq: Two times a day (BID) | ORAL | 1 refills | Status: DC

## 2017-07-11 NOTE — Assessment & Plan Note (Signed)
metformin

## 2017-07-11 NOTE — Progress Notes (Signed)
Subjective:  Patient ID: Jessica [name], female    DOB: 03-29-1934  Age: 84 y.o. MRN: [id number]  CC: No chief complaint on file.   HPI Jessica [name] presents for post-hosp f/u for PAF. She is s/p AV nodal ablation.  F/u HTN, DM  Per hx: "Primary Discharge Diagnosis:  1. Permanent AFib w/uncontroled rates     CHA2DS2Vasc is 5, on Eliquis     S/p AVNode ablation this admission      Secondary Discharge Diagnosis:  1. Hx of High AV block w/PPM 2. VHD     Mod AS 3. HTN 4. DM 5. Hypothyroidism 6. Chronic CHF (systolic/diastolic) 7. NICM       Allergies  Allergen Reactions  . Coreg [Carvedilol] Other (See Comments)    Weak legs  . Relafen [Nabumetone] Other (See Comments)    Upset stomach  . Amiodarone Other (See Comments)    Patient reported intolerance to Amiodarone with worsened tremor and stopped pta. (hand tremors)  . Atorvastatin     Myalgias  . Calcium Other (See Comments)    unknown  . Codeine Other (See Comments)    unknown  . Rofecoxib Other (See Comments)    unknown  . Simvastatin     Myalgias  . Enalapril Maleate Other (See Comments)    REACTION: cough     Procedures This Admission: 1.  Electrophysiology study and radiofrequency catheter ablation of AV node on 07/05/17 by [name].   This study demonstrated CONCLUSIONS:  1. Atypical atrial flutter with RVR upon presentation.  2. Successful AV nodal ablation 3. No early apparent complications.    Brief HPI: Jessica [name] is a 84 y.o. female with a past medical history as outlined above.  She has permanent AFib with uncontrolled rates.   Risks, benefits, and alternatives to AV node ablation were reviewed with the patient who wished to proceed.   Hospital Course:  The patient was admitted and underwent EPS/RFCA with details as outlined above. She was monitored on telemetry overnight which demonstrated V paced at 80 bpm.   R groin procedure site was without  complication.  The patient was examined by Dr.  Rayann Heman and considered stable for discharge to home.  Follow up will be arranged in 2 weeks for device reprogramming.   Site care and activity restrictions were reviewed with the patient prior to discharge. "      Outpatient Medications Prior to Visit  Medication Sig Dispense Refill  . acetaminophen (TYLENOL) 325 MG tablet Take 2 tablets (650 mg total) by mouth every 6 (six) hours as needed for mild pain (or Fever >/= 101).    [name] allopurinol (ZYLOPRIM) 100 MG tablet Take 100 mg by mouth daily.    [name] b complex vitamins tablet Take 1 tablet by mouth daily.    . Cholecalciferol 1000 UNITS tablet Take 1,000 Units by mouth daily.      [name] ELIQUIS 5 MG TABS tablet TAKE 1 TABLET TWICE DAILY 180 tablet 3  . furosemide (LASIX) 40 MG tablet Take 1 tablet (40 mg total) by mouth 2 (two) times daily. 60 tablet 0  . HYDROcodone-acetaminophen (NORCO/VICODIN) 5-325 MG tablet Take 1 tablet by mouth every 8 (eight) hours as needed for moderate pain. 100 tablet 0  . levothyroxine (SYNTHROID, LEVOTHROID) 75 MCG tablet TAKE 1 TABLET EVERY DAY 90 tablet 3  . metFORMIN (GLUCOPHAGE-XR) 750 MG 24 hr tablet Take 1 tablet (750 mg total) by mouth daily with breakfast. 90 tablet 3  .  metoprolol tartrate (LOPRESSOR) 50 MG tablet Take 0.5 tablets (25 mg total) by mouth 2 (two) times daily.    . Multiple Vitamins-Minerals (OCUVITE PRESERVISION) TABS Take 1 tablet by mouth 2 (two) times daily.    . multivitamin-iron-minerals-folic acid (CENTRUM) chewable tablet Chew 1 tablet by mouth daily.    [name] omeprazole (PRILOSEC) 40 MG capsule TAKE 1 CAPSULE EVERY DAY (Patient taking differently: TAKE 40 mg CAPSULE as needed) 90 capsule 3  . potassium chloride SA (K-DUR,KLOR-CON) 20 MEQ tablet Take 1 tablet (20 mEq total) by mouth daily. 90 tablet 1  . tetrahydrozoline 0.05 % ophthalmic solution Place 1 drop into both eyes daily.    [name] venlafaxine XR (EFFEXOR-XR) 75 MG 24 hr capsule TAKE 1  CAPSULE EVERY DAY WITH BREAKFAST (Patient taking differently: TAKE 75 mg CAPSULE EVERY DAY WITH BREAKFAST) 90 capsule 2  . LORazepam (ATIVAN) 0.5 MG tablet Take 1 tablet (0.5 mg total) by mouth 2 (two) times daily. (Patient taking differently: Take 0.5 mg by mouth at bedtime. ) 180 tablet 1  . temazepam (RESTORIL) 15 MG capsule TAKE 1 CAPSULE AT BEDTIME AS NEEDED  FOR  SLEEP 90 capsule 1   No facility-administered medications prior to visit.     ROS Review of Systems  Constitutional: Positive for fatigue. Negative for activity change, appetite change, chills and unexpected weight change.  HENT: Negative for congestion, mouth sores and sinus pressure.   Eyes: Negative for visual disturbance.  Respiratory: Positive for shortness of breath. Negative for cough and chest tightness.   Cardiovascular: Negative for chest pain and leg swelling.  Gastrointestinal: Negative for abdominal pain and nausea.  Genitourinary: Negative for difficulty urinating, frequency and vaginal pain.  Musculoskeletal: Positive for arthralgias, back pain and gait problem.  Skin: Negative for pallor and rash.  Neurological: Negative for dizziness, tremors, weakness, numbness and headaches.  Psychiatric/Behavioral: Positive for sleep disturbance. Negative for confusion and suicidal ideas. The patient is nervous/anxious.     Objective:  BP 124/74 (BP Location: Left Arm, Patient Position: Sitting, Cuff Size: Large)   Pulse 80   Temp 97.8 F (36.6 C) (Oral)   Ht 5\' 2"  (1.575 m)   Wt 246 lb (111.6 kg)   SpO2 96%   BMI 44.99 kg/m   BP Readings from Last 3 Encounters:  07/11/17 124/74  07/06/17 120/81  07/03/17 94/72    Wt Readings from Last 3 Encounters:  07/11/17 246 lb (111.6 kg)  07/05/17 246 lb (111.6 kg)  07/03/17 251 lb (113.9 kg)    Physical Exam  Constitutional: She appears well-developed. No distress.  HENT:  Head: Normocephalic.  Right Ear: External ear normal.  Left Ear: External ear normal.    Nose: Nose normal.  Mouth/Throat: Oropharynx is clear and moist.  Eyes: Pupils are equal, round, and reactive to light. Conjunctivae are normal. Right eye exhibits no discharge. Left eye exhibits no discharge.  Neck: Normal range of motion. Neck supple. No JVD present. No tracheal deviation present. No thyromegaly present.  Cardiovascular: Normal rate, regular rhythm and normal heart sounds.  Pulmonary/Chest: No stridor. No respiratory distress. She has no wheezes.  Abdominal: Soft. Bowel sounds are normal. She exhibits no distension and no mass. There is no tenderness. There is no rebound and no guarding.  Musculoskeletal: She exhibits edema and tenderness.  Lymphadenopathy:    She has no cervical adenopathy.  Neurological: She displays normal reflexes. No cranial nerve deficit. She exhibits normal muscle tone. Coordination normal.  Skin: Rash noted. There is erythema.  Psychiatric: She has a normal mood and affect. Her behavior is normal. Judgment and thought content normal.   Obese LE edema L>R trace LLE indurated skin  Lab Results  Component Value Date   WBC 7.5 06/26/2017   HGB 13.2 06/26/2017   HCT 40.2 06/26/2017   PLT 360 06/26/2017   GLUCOSE 148 (H) 06/26/2017   CHOL 191 02/19/2017   TRIG 183.0 (H) 02/19/2017   HDL 38.90 (L) 02/19/2017   LDLDIRECT 155.9 09/30/2012   LDLCALC 116 (H) 02/19/2017   ALT 15 06/22/2017   AST 24 06/22/2017   NA 137 06/26/2017   K 3.8 06/26/2017   CL 99 (L) 06/26/2017   CREATININE 0.91 06/26/2017   BUN 26 (H) 06/26/2017   CO2 24 06/26/2017   TSH 2.771 06/22/2017   INR 1.68 06/23/2017   HGBA1C 6.4 (H) 06/22/2017   MICROALBUR 4.5 (H) 12/02/2009    No results found.  Assessment & Plan:   There are no diagnoses linked to this encounter. I have changed Nelwyn Salisbury. Pillay's temazepam. I am also having her maintain her Cholecalciferol, OCUVITE PRESERVISION, b complex vitamins, omeprazole, venlafaxine XR, metFORMIN,  HYDROcodone-acetaminophen, tetrahydrozoline, levothyroxine, ELIQUIS, potassium chloride SA, multivitamin-iron-minerals-folic acid, acetaminophen, furosemide, allopurinol, metoprolol tartrate, and LORazepam.  Meds ordered this encounter  Medications  . LORazepam (ATIVAN) 0.5 MG tablet    Sig: Take 1 tablet (0.5 mg total) by mouth 2 (two) times daily.    Dispense:  180 tablet    Refill:  1  . temazepam (RESTORIL) 15 MG capsule    Sig: Take 1 capsule (15 mg total) by mouth at bedtime as needed for sleep.    Dispense:  90 capsule    Refill:  1     Follow-up: No follow-ups on file.  Walker Kehr, MD

## 2017-07-11 NOTE — Assessment & Plan Note (Signed)
Triamt/HCTZ, Losartan 4/19 AV nodal ablation.

## 2017-07-11 NOTE — Assessment & Plan Note (Signed)
AV nodal ablation 4/19

## 2017-07-11 NOTE — Assessment & Plan Note (Signed)
Cont w/wt loss 

## 2017-07-11 NOTE — Assessment & Plan Note (Signed)
Triamt/HCTZ, Losartan 

## 2017-07-12 ENCOUNTER — Other Ambulatory Visit: Payer: Self-pay

## 2017-07-12 NOTE — Patient Outreach (Signed)
[facility] [facility]) Care Management  07/12/2017  TASHANA HABERL 09/19/1933 [id number]   Telephone call to the patient for Initial assessment.  No answer. HIPAA compliant voicemail left with contact information.  Plan: RN Health Coach will send letter. RN Health Coach will make another attempt to the patient in three to four business days.  Lazaro Arms RN, BSN, [place] Direct Dial:  [phone]  Fax: [phone]

## 2017-07-12 NOTE — Patient Outreach (Signed)
[facility]) Care Management  [place]  07/12/2017   Jessica [name] [birth year] [id number]  Subjective: Telephone call received from the patient .  HIPAA verified.  The patient woke up this morning and was feeling a little off.  She denies any pain, falls, breathing issues or swelling.  The patient states that she lives in the home with her sister.  She states that she is independent with her ADLS/IADLS.  The patient states she can drive to her appointments.  The patient states that she has a cane, walker , wheelchair, scale, CBG meter, blood pressure machine and glasses.  The patient states she does not have an advanced directive but would like information.  Per chart review the patient's medical condition consist of HTN, DM, A Fib, CHF, GERD, Osteo Arthritis and GERD.  The patient states that she is adherent with her medications.  She checked her blood pressure today and it was 130/80 and pulse was 79.  The patient had an appointment  with her PCP Dr Alain Marion yesterday  and she is scheduled to see her cardiologist  Dr  Rayann Heman on Monday.  Encounter Medications:  Outpatient Encounter Medications as of 07/12/2017  Medication Sig  . acetaminophen (TYLENOL) 325 MG tablet Take 2 tablets (650 mg total) by mouth every 6 (six) hours as needed for mild pain (or Fever >/= 101).  [name] allopurinol (ZYLOPRIM) 100 MG tablet Take 100 mg by mouth daily.  [name] b complex vitamins tablet Take 1 tablet by mouth daily.  . Cholecalciferol 1000 UNITS tablet Take 1,000 Units by mouth daily.    [name] ELIQUIS 5 MG TABS tablet TAKE 1 TABLET TWICE DAILY  . furosemide (LASIX) 40 MG tablet Take 1 tablet (40 mg total) by mouth 2 (two) times daily.  [name] levothyroxine (SYNTHROID, LEVOTHROID) 75 MCG tablet TAKE 1 TABLET EVERY DAY  . LORazepam (ATIVAN) 0.5 MG tablet Take 1 tablet (0.5 mg total) by mouth 2 (two) times daily.  . metFORMIN (GLUCOPHAGE-XR) 750 MG 24 hr tablet Take 1 tablet (750 mg total) by mouth  daily with breakfast.  . metoprolol tartrate (LOPRESSOR) 50 MG tablet Take 0.5 tablets (25 mg total) by mouth 2 (two) times daily.  . Multiple Vitamins-Minerals (OCUVITE PRESERVISION) TABS Take 1 tablet by mouth 2 (two) times daily.  . multivitamin-iron-minerals-folic acid (CENTRUM) chewable tablet Chew 1 tablet by mouth daily.  [name] omeprazole (PRILOSEC) 40 MG capsule TAKE 1 CAPSULE EVERY DAY (Patient taking differently: TAKE 40 mg CAPSULE as needed)  . potassium chloride SA (K-DUR,KLOR-CON) 20 MEQ tablet Take 1 tablet (20 mEq total) by mouth daily.  . temazepam (RESTORIL) 15 MG capsule Take 1 capsule (15 mg total) by mouth at bedtime as needed for sleep.  [name] tetrahydrozoline 0.05 % ophthalmic solution Place 1 drop into both eyes daily.  [name] venlafaxine XR (EFFEXOR-XR) 75 MG 24 hr capsule TAKE 1 CAPSULE EVERY DAY WITH BREAKFAST (Patient taking differently: TAKE 75 mg CAPSULE EVERY DAY WITH BREAKFAST)  . HYDROcodone-acetaminophen (NORCO/VICODIN) 5-325 MG tablet Take 1 tablet by mouth every 8 (eight) hours as needed for moderate pain. (Patient not taking: Reported on 07/12/2017)   No facility-administered encounter medications on file as of 07/12/2017.     Functional Status:  In your present state of health, do you have any difficulty performing the following activities: 07/12/2017 07/06/2017  Hearing? N N  Vision? N N  Difficulty concentrating or making decisions? N N  Walking or climbing stairs? Y Y  Dressing or bathing?  N N  Doing errands, shopping? N N  Preparing Food and eating ? - -  Using the Toilet? - -  In the past six months, have you accidently leaked urine? - -  Do you have problems with loss of bowel control? - -  Managing your Medications? - -  Managing your Finances? - -  Housekeeping or managing your Housekeeping? - -  Some recent data might be hidden    Fall/Depression Screening: Fall Risk  07/12/2017 12/20/2016 12/08/2016  Falls in the past year? No No No  Number falls in past  yr: - - -  Injury with Fall? - - -  Risk for fall due to : - Impaired balance/gait History of fall(s)  Follow up - - -   PHQ 2/9 Scores 07/12/2017 12/20/2016 12/08/2016 07/10/2016 10/27/2015 10/19/2014 09/24/2014  PHQ - 2 Score 2 2 0 - 0 0 0  PHQ- 9 Score 3 3 - - - - -  Exception Documentation - - - Medical reason - - -    Assessment: Patient will benefit from health coach outreach for disease management and support.   THN CM Care Plan Problem One     Most Recent Value  Care Plan Problem One  Knowledge deficit related to diease managment  Role Documenting the Problem One  [place] for Problem One  Active  THN Long Term Goal   In 90 days the patient will verbalize not haveing an admission in the [place] Term Goal Start Date  07/12/17  Interventions for Problem One Long Term Goal  [facility] will send educationl material for the patient to review.  THN CM Short Term Goal #1   In 30 days the patient will be able to verbalize two signs and symptoms in red zone heart failure action plan  THN CM Short Term Goal #1 Start Date  07/12/17  Interventions for Short Term Goal #1  [facility] will send Heart failure action plan for the patient to review.  THN CM Short Term Goal #2   In 30 days the patient will be able to verbalize two food s low in salt.  THN CM Short Term Goal #2 Start Date  07/12/17  Interventions for Short Term Goal #2  [facility] will send educational material to the patient about low salt diet to review.     Plan: RN Health Coach will provide ongoing education for patient on heart failure through phone calls and sending printed information to patient for further discussion.  RN Health Coach will send welcome packet with consent to patient as well as printed information on heart failure.  RN Health Coach will send initial barriers letter, assessment, and care plan to primary care physician.  Lazaro Arms RN, BSN, [place] Direct Dial:  [phone]  Fax: [phone]

## 2017-07-16 ENCOUNTER — Ambulatory Visit: Payer: Medicare HMO | Admitting: Internal Medicine

## 2017-07-16 ENCOUNTER — Encounter: Payer: Self-pay | Admitting: Internal Medicine

## 2017-07-16 VITALS — BP 124/62 | HR 80 | Ht 62.0 in | Wt 242.0 lb

## 2017-07-16 DIAGNOSIS — I441 Atrioventricular block, second degree: Secondary | ICD-10-CM | POA: Diagnosis not present

## 2017-07-16 DIAGNOSIS — I350 Nonrheumatic aortic (valve) stenosis: Secondary | ICD-10-CM | POA: Diagnosis not present

## 2017-07-16 DIAGNOSIS — Z950 Presence of cardiac pacemaker: Secondary | ICD-10-CM | POA: Diagnosis not present

## 2017-07-16 DIAGNOSIS — I10 Essential (primary) hypertension: Secondary | ICD-10-CM | POA: Diagnosis not present

## 2017-07-16 DIAGNOSIS — I4892 Unspecified atrial flutter: Secondary | ICD-10-CM | POA: Diagnosis not present

## 2017-07-16 DIAGNOSIS — I480 Paroxysmal atrial fibrillation: Secondary | ICD-10-CM

## 2017-07-16 NOTE — Patient Instructions (Addendum)
Medication Instructions:  Your physician recommends that you continue on your current medications as directed. Please refer to the Current Medication list given to you today.  Labwork: None ordered.  Testing/Procedures: None ordered.  Follow-Up: Your physician wants you to follow-up in: 8 weeks with Dr. Rayann Heman.     Remote monitoring is used to monitor your Pacemaker from home. This monitoring reduces the number of office visits required to check your device to one time per year. It allows [place] to keep an eye on the functioning of your device to ensure it is working properly. You are scheduled for a device check from home on 09/27/2017. You may send your transmission at any time that day. If you have a wireless device, the transmission will be sent automatically. After your physician reviews your transmission, you will receive a postcard with your next transmission date.  Any Other Special Instructions Will Be Listed Below (If Applicable).  If you need a refill on your cardiac medications before your next appointment, please call your pharmacy.
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
## 2017-07-17 LAB — CUP PACEART INCLINIC DEVICE CHECK
Battery Remaining Longevity: 112 mo
Brady Statistic RV Percent Paced: 100 %
Implantable Lead Implant Date: 20140710
Implantable Lead Location: 753859
Implantable Lead Model: 5592
Lead Channel Impedance Value: 666 Ohm
Lead Channel Pacing Threshold Amplitude: 0.5 V
Lead Channel Pacing Threshold Amplitude: 0.5 V
Lead Channel Pacing Threshold Pulse Width: 0.4 ms
Lead Channel Pacing Threshold Pulse Width: 0.4 ms
Lead Channel Sensing Intrinsic Amplitude: 8 mV
MDC IDC LEAD IMPLANT DT: 20140710
MDC IDC LEAD LOCATION: 753860
MDC IDC MSMT BATTERY IMPEDANCE: 280 Ohm
MDC IDC MSMT BATTERY VOLTAGE: 2.79 V
MDC IDC MSMT LEADCHNL RA IMPEDANCE VALUE: 67 Ohm
MDC IDC PG IMPLANT DT: 20140710
MDC IDC SESS DTM: 20190415195625
MDC IDC SET LEADCHNL RV PACING AMPLITUDE: 2.5 V
MDC IDC SET LEADCHNL RV PACING PULSEWIDTH: 0.4 ms
MDC IDC SET LEADCHNL RV SENSING SENSITIVITY: 4 mV

## 2017-08-09 ENCOUNTER — Other Ambulatory Visit: Payer: Self-pay | Admitting: Internal Medicine

## 2017-08-09 MED ORDER — METOPROLOL TARTRATE 50 MG PO TABS: 25.0000 mg | ORAL_TABLET | Freq: Two times a day (BID) | ORAL | 9 refills | Status: DC

## 2017-08-09 NOTE — Telephone Encounter (Signed)
This is Dr. Minot's pt.  °

## 2017-08-09 NOTE — Telephone Encounter (Signed)
Rx(s) sent to pharmacy electronically.  

## 2017-08-09 NOTE — Telephone Encounter (Signed)
Copied from [place] [id number]. Topic: Quick Communication - Rx Refill/Question >> Aug 09, 2017 10:43 AM [place], Laban Emperor wrote: Medication: furosemide (LASIX) 40 MG tablet [[id number]]  Has the patient contacted their pharmacy? Yes.   (Agent: If no, request that the patient contact the pharmacy for the refill.) Preferred Pharmacy (with phone number or street name): [id number] [place], [place] [phone] (Phone) [phone] (Fax)  *Medication was order while the pt was in the hosp.  Agent: Please be advised that RX refills may take up to 3 business days. We ask that you follow-up with your pharmacy.

## 2017-08-09 NOTE — Telephone Encounter (Signed)
New Message:         *STAT* If patient is at the pharmacy, call can be transferred to refill team.   1. Which medications need to be refilled? (please list name of each medication and dose if known) metoprolol tartrate (LOPRESSOR) 50 MG tablet  2. Which pharmacy/location (including street and city if local pharmacy) is medication to be sent to?[id number] [place], [place] - [address] AT [place]  3. Do they need a 30 day or 90 day supply? [place]

## 2017-08-10 NOTE — Telephone Encounter (Signed)
Pt requesting a refill of Furosemide (Lasix) 40mg  tab, last ordered on 06/26/17 while the pt was in the hospital.   LOV: 07/11/17 Dr. [facility]  [facility]    [address]

## 2017-08-13 ENCOUNTER — Other Ambulatory Visit: Payer: Self-pay

## 2017-08-13 NOTE — Patient Outreach (Signed)
[facility]) Care Management  08/13/2017  Jessica [name] [birth year] [id number]   RN Health Coach Monthly Outreach   Outreach Attempt:  Successful telephone outreach to patient for monthly follow up.  HIPAA verified with patient.  Patient stating she is doing well.  Has felt a lot better since ablation, states her groin has healed from the procedure as well.  Denies any chest pain, shortness of breath, or swelling in her hands or lower extremities.  Reports weight Saturday was 241 pounds.  States she does not weigh daily but does a couple of times a week.  Encouraged patient to weigh herself daily.  Patient stating she only takes lasix once a day because she does not want to take second dose at bedtime and be up all night.  Reviewed possible lasix administration times with patient and encouraged her to taking Lasix as prescribed twice a day, with second dose earlier in the evenings around 4 pm to assist with not being up all night.  Patient stated her understanding and stated she would do this.  Monitors blood sugars about 3 times a week.  Fasting blood sugar was 138 on Saturday and states her range has been in 130's with one hypoglycemic episode in the 70's.  Appointments:  Patient last saw Dr. Alain Marion on 07/11/2017 with scheduled follow up appointment on 08/23/2017.  Has scheduled appointment with Dr. Oval Linsey, Cardiologist on 08/17/2017.  Patient encouraged to keep and attend scheduled appointments.  Plan: RN Health Coach will make next monthly outreach to patient in the month of June. RN Health Coach will resend patient Living Better with Heart Failure Educational Packet.  [place] [phone] [email or web address]

## 2017-08-15 MED ORDER — FUROSEMIDE 40 MG PO TABS: 40.0000 mg | ORAL_TABLET | Freq: Two times a day (BID) | ORAL | 11 refills | Status: DC

## 2017-08-17 ENCOUNTER — Encounter: Payer: Self-pay | Admitting: Cardiovascular Disease

## 2017-08-17 ENCOUNTER — Ambulatory Visit (INDEPENDENT_AMBULATORY_CARE_PROVIDER_SITE_OTHER): Payer: Medicare HMO | Admitting: Cardiovascular Disease

## 2017-08-17 VITALS — BP 131/72 | HR 87 | Ht 62.0 in | Wt 245.8 lb

## 2017-08-17 DIAGNOSIS — E7800 Pure hypercholesterolemia, unspecified: Secondary | ICD-10-CM | POA: Diagnosis not present

## 2017-08-17 DIAGNOSIS — I350 Nonrheumatic aortic (valve) stenosis: Secondary | ICD-10-CM | POA: Diagnosis not present

## 2017-08-17 DIAGNOSIS — Z5181 Encounter for therapeutic drug level monitoring: Secondary | ICD-10-CM

## 2017-08-17 DIAGNOSIS — I5042 Chronic combined systolic (congestive) and diastolic (congestive) heart failure: Secondary | ICD-10-CM

## 2017-08-17 MED ORDER — SACUBITRIL-VALSARTAN 24-26 MG PO TABS: 1.0000 | ORAL_TABLET | Freq: Two times a day (BID) | ORAL | 5 refills | Status: DC

## 2017-08-17 MED ORDER — METOPROLOL SUCCINATE ER 50 MG PO TB24: 50.0000 mg | ORAL_TABLET | Freq: Every day | ORAL | 1 refills | Status: DC

## 2017-08-17 NOTE — Patient Instructions (Addendum)
Medication Instructions:  WHEN YOU FINISH YOUR CURRENT METOPROLOL CHANGE TO METOPROLOL SUC 50 MG ONCE A DAY   START ENTRESTO 24 MG-26 MG 1 TABLET TWICE A DAY   Labwork: BMET 1 WEEK   Testing/Procedures: Your physician has requested that you have an echocardiogram. Echocardiography is a painless test that uses sound waves to create images of your heart. It provides your doctor with information about the size and shape of your heart and how well your heart's chambers and valves are working. This procedure takes approximately one hour. There are no restrictions for this procedure. IN July [facility] [address]   Follow-Up: Your physician recommends that you schedule a follow-up appointment in: AFTER ECHO   Any Other Special Instructions Will Be Listed Below (If Applicable).     If you need a refill on your cardiac medications before your next appointment, please call your pharmacy.
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
## 2017-08-21 ENCOUNTER — Telehealth: Payer: Self-pay | Admitting: *Deleted

## 2017-08-21 NOTE — Telephone Encounter (Signed)
PA submitted covermymeds  Key [id number]

## 2017-08-22 ENCOUNTER — Encounter: Payer: Self-pay | Admitting: Cardiovascular Disease

## 2017-08-22 NOTE — Telephone Encounter (Signed)
Received notification Entresto approval good until 08/23/19. Message left on machine at [facility]

## 2017-08-23 ENCOUNTER — Ambulatory Visit (INDEPENDENT_AMBULATORY_CARE_PROVIDER_SITE_OTHER): Payer: Medicare HMO | Admitting: Internal Medicine

## 2017-08-23 ENCOUNTER — Encounter: Payer: Self-pay | Admitting: Internal Medicine

## 2017-08-23 DIAGNOSIS — G8929 Other chronic pain: Secondary | ICD-10-CM | POA: Diagnosis not present

## 2017-08-23 DIAGNOSIS — E118 Type 2 diabetes mellitus with unspecified complications: Secondary | ICD-10-CM

## 2017-08-23 DIAGNOSIS — I480 Paroxysmal atrial fibrillation: Secondary | ICD-10-CM

## 2017-08-23 DIAGNOSIS — M545 Low back pain, unspecified: Secondary | ICD-10-CM

## 2017-08-23 DIAGNOSIS — R0609 Other forms of dyspnea: Secondary | ICD-10-CM

## 2017-08-23 DIAGNOSIS — Z5181 Encounter for therapeutic drug level monitoring: Secondary | ICD-10-CM | POA: Diagnosis not present

## 2017-08-23 DIAGNOSIS — R600 Localized edema: Secondary | ICD-10-CM | POA: Diagnosis not present

## 2017-08-23 DIAGNOSIS — I5043 Acute on chronic combined systolic (congestive) and diastolic (congestive) heart failure: Secondary | ICD-10-CM

## 2017-08-23 LAB — BASIC METABOLIC PANEL
BUN/Creatinine Ratio: 20 (ref 12–28)
BUN: 22 mg/dL (ref 8–27)
CALCIUM: 10 mg/dL (ref 8.7–10.3)
CHLORIDE: 100 mmol/L (ref 96–106)
CO2: 20 mmol/L (ref 20–29)
Creatinine, Ser: 1.09 mg/dL — ABNORMAL HIGH (ref 0.57–1.00)
GFR calc non Af Amer: 48 mL/min/{1.73_m2} — ABNORMAL LOW (ref >59)
GFR, EST AFRICAN AMERICAN: 55 mL/min/{1.73_m2} — AB (ref >59)
GLUCOSE: 195 mg/dL — AB (ref 65–99)
Potassium: 4.6 mmol/L (ref 3.5–5.2)
Sodium: 137 mmol/L (ref 134–144)

## 2017-08-23 MED ORDER — TEMAZEPAM 15 MG PO CAPS: 15.0000 mg | ORAL_CAPSULE | Freq: Every evening | ORAL | 1 refills | Status: DC | PRN

## 2017-08-23 NOTE — Assessment & Plan Note (Signed)
Wt Readings from Last 3 Encounters:  08/23/17 245 lb (111.1 kg)  08/17/17 245 lb 12.8 oz (111.5 kg)  07/16/17 242 lb (109.8 kg)

## 2017-08-23 NOTE — Assessment & Plan Note (Signed)
  On diet  

## 2017-08-23 NOTE — Assessment & Plan Note (Signed)
Multifactorial 

## 2017-08-23 NOTE — Progress Notes (Signed)
Subjective:  Patient ID: Jessica [name], female    DOB: [birth year]  Age: 84 y.o. MRN: [id number]  CC: No chief complaint on file.   HPI Jessica [name] presents for CHF, A fib, obesity, chronic pain f/u  Outpatient Medications Prior to Visit  Medication Sig Dispense Refill  . acetaminophen (TYLENOL) 325 MG tablet Take 2 tablets (650 mg total) by mouth every 6 (six) hours as needed for mild pain (or Fever >/= 101).    [name] allopurinol (ZYLOPRIM) 100 MG tablet Take 100 mg by mouth daily.    [name] b complex vitamins tablet Take 1 tablet by mouth daily.    . Cholecalciferol 1000 UNITS tablet Take 1,000 Units by mouth daily.      [name] ELIQUIS 5 MG TABS tablet TAKE 1 TABLET TWICE DAILY 180 tablet 3  . furosemide (LASIX) 40 MG tablet Take 1 tablet (40 mg total) by mouth 2 (two) times daily. 60 tablet 11  . HYDROcodone-acetaminophen (NORCO/VICODIN) 5-325 MG tablet Take 1 tablet by mouth every 8 (eight) hours as needed for moderate pain. 100 tablet 0  . levothyroxine (SYNTHROID, LEVOTHROID) 75 MCG tablet TAKE 1 TABLET EVERY DAY 90 tablet 3  . LORazepam (ATIVAN) 0.5 MG tablet Take 1 tablet (0.5 mg total) by mouth 2 (two) times daily. 180 tablet 1  . metFORMIN (GLUCOPHAGE-XR) 750 MG 24 hr tablet Take 1 tablet (750 mg total) by mouth daily with breakfast. 90 tablet 3  . metoprolol succinate (TOPROL XL) 50 MG 24 hr tablet Take 1 tablet (50 mg total) by mouth daily. Take with or immediately following a meal. 90 tablet 1  . Multiple Vitamins-Minerals (OCUVITE PRESERVISION) TABS Take 1 tablet by mouth 2 (two) times daily.    . multivitamin-iron-minerals-folic acid (CENTRUM) chewable tablet Chew 1 tablet by mouth daily.    [name] omeprazole (PRILOSEC) 40 MG capsule TAKE 1 CAPSULE EVERY DAY 90 capsule 3  . potassium chloride SA (K-DUR,KLOR-CON) 20 MEQ tablet Take 1 tablet (20 mEq total) by mouth daily. 90 tablet 1  . sacubitril-valsartan (ENTRESTO) 24-26 MG Take 1 tablet by mouth 2 (two) times daily. 60 tablet 5    . temazepam (RESTORIL) 15 MG capsule Take 1 capsule (15 mg total) by mouth at bedtime as needed for sleep. 90 capsule 1  . tetrahydrozoline 0.05 % ophthalmic solution Place 1 drop into both eyes daily.    [name] venlafaxine XR (EFFEXOR-XR) 75 MG 24 hr capsule TAKE 1 CAPSULE EVERY DAY WITH BREAKFAST 90 capsule 2   No facility-administered medications prior to visit.     ROS Review of Systems  Constitutional: Negative for activity change, appetite change, chills, fatigue and unexpected weight change.  HENT: Negative for congestion, mouth sores and sinus pressure.   Eyes: Negative for visual disturbance.  Respiratory: Positive for shortness of breath. Negative for cough and chest tightness.   Gastrointestinal: Negative for abdominal pain and nausea.  Genitourinary: Negative for difficulty urinating, frequency and vaginal pain.  Musculoskeletal: Positive for arthralgias, back pain and gait problem.  Skin: Negative for pallor and rash.  Neurological: Negative for dizziness, tremors, weakness, numbness and headaches.  Psychiatric/Behavioral: Positive for dysphoric mood. Negative for confusion, sleep disturbance and suicidal ideas. The patient is nervous/anxious.     Objective:  BP 128/76 (BP Location: Left Arm, Patient Position: Sitting, Cuff Size: Large)   Pulse 86   Temp 98.1 F (36.7 C) (Oral)   Ht 5\' 2"  (1.575 m)   Wt 245 lb (111.1 kg)   SpO2  99%   BMI 44.81 kg/m   BP Readings from Last 3 Encounters:  08/23/17 128/76  08/17/17 131/72  07/16/17 124/62    Wt Readings from Last 3 Encounters:  08/23/17 245 lb (111.1 kg)  08/17/17 245 lb 12.8 oz (111.5 kg)  07/16/17 242 lb (109.8 kg)    Physical Exam  Constitutional: She appears well-developed. No distress.  HENT:  Head: Normocephalic.  Right Ear: External ear normal.  Left Ear: External ear normal.  Nose: Nose normal.  Mouth/Throat: Oropharynx is clear and moist.  Eyes: Pupils are equal, round, and reactive to light.  Conjunctivae are normal. Right eye exhibits no discharge. Left eye exhibits no discharge.  Neck: Normal range of motion. Neck supple. No JVD present. No tracheal deviation present. No thyromegaly present.  Cardiovascular: Normal rate and regular rhythm.  Murmur heard. Pulmonary/Chest: No stridor. No respiratory distress. She has no wheezes.  Abdominal: Soft. Bowel sounds are normal. She exhibits no distension and no mass. There is no tenderness. There is no rebound and no guarding.  Musculoskeletal: She exhibits edema and tenderness.  Lymphadenopathy:    She has no cervical adenopathy.  Neurological: She displays normal reflexes. No cranial nerve deficit. She exhibits normal muscle tone. Coordination normal.  Skin: No rash noted. No erythema.  Psychiatric: She has a normal mood and affect. Her behavior is normal. Judgment and thought content normal.   LEs trace edema LEs NT LS tender Cat scratch/bite marks all over forearms  Lab Results  Component Value Date   WBC 7.5 06/26/2017   HGB 13.2 06/26/2017   HCT 40.2 06/26/2017   PLT 360 06/26/2017   GLUCOSE 148 (H) 06/26/2017   CHOL 191 02/19/2017   TRIG 183.0 (H) 02/19/2017   HDL 38.90 (L) 02/19/2017   LDLDIRECT 155.9 09/30/2012   LDLCALC 116 (H) 02/19/2017   ALT 15 06/22/2017   AST 24 06/22/2017   NA 137 06/26/2017   K 3.8 06/26/2017   CL 99 (L) 06/26/2017   CREATININE 0.91 06/26/2017   BUN 26 (H) 06/26/2017   CO2 24 06/26/2017   TSH 2.771 06/22/2017   INR 1.68 06/23/2017   HGBA1C 6.4 (H) 06/22/2017   MICROALBUR 4.5 (H) 12/02/2009    No results found.  Assessment & Plan:   There are no diagnoses linked to this encounter. I am having Jessica [name] maintain her Cholecalciferol, OCUVITE PRESERVISION, b complex vitamins, omeprazole, venlafaxine XR, metFORMIN, HYDROcodone-acetaminophen, tetrahydrozoline, levothyroxine, ELIQUIS, potassium chloride SA, multivitamin-iron-minerals-folic acid, acetaminophen, allopurinol,  LORazepam, temazepam, furosemide, metoprolol succinate, and sacubitril-valsartan.  No orders of the defined types were placed in this encounter.    Follow-up: No follow-ups on file.  Walker Kehr, MD

## 2017-08-23 NOTE — Assessment & Plan Note (Signed)
In NSR 

## 2017-08-23 NOTE — Assessment & Plan Note (Signed)
Labs

## 2017-08-23 NOTE — Assessment & Plan Note (Signed)
Entresto

## 2017-08-23 NOTE — Assessment & Plan Note (Signed)
Norco rare use

## 2017-09-04 ENCOUNTER — Other Ambulatory Visit: Payer: Self-pay

## 2017-09-04 NOTE — Patient Outreach (Signed)
[facility]) Care Management  [place]  09/04/2017   Jessica [name] 1933-09-05 [id number]  Subjective: Telephone call placed to the patient for monthly assessment.  HIPAA verified.  The patient states that she is doing ok.  She denies any cp, sob, and swelling.  She did not weight herself yet today but yesterday she weighed 240.  She states that her blood sugar two days ago was 128. She states that she is taking her medications. She takes her fluid pill one time a day and twice when she needs it.  She was started on a new medication enstro at her last doctor visit but has not started taking the medication yet.  She states that she was told to finish her other medication and then start the new on.  I advised the patient on the importance of taking her medications as prescribed and she verbalized understanding.The patient states that she has been monitoring her diet.  She has kept both of her appointments with Dr Alain Marion and Oval Linsey.  She expressed that she has noticed she has been having dizziness and it has gotten worse in the last two weeks.  I advised the patient to call her physician office and notify them of her condition.  She stated that she would call.   Encounter Medications:  Outpatient Encounter Medications as of 09/04/2017  Medication Sig  . acetaminophen (TYLENOL) 325 MG tablet Take 2 tablets (650 mg total) by mouth every 6 (six) hours as needed for mild pain (or Fever >/= 101).  [name] allopurinol (ZYLOPRIM) 100 MG tablet Take 100 mg by mouth daily.  [name] b complex vitamins tablet Take 1 tablet by mouth daily.  . Cholecalciferol 1000 UNITS tablet Take 1,000 Units by mouth daily.    [name] ELIQUIS 5 MG TABS tablet TAKE 1 TABLET TWICE DAILY  . furosemide (LASIX) 40 MG tablet Take 1 tablet (40 mg total) by mouth 2 (two) times daily.  [name] levothyroxine (SYNTHROID, LEVOTHROID) 75 MCG tablet TAKE 1 TABLET EVERY DAY  . LORazepam (ATIVAN) 0.5 MG tablet Take 1 tablet (0.5 mg  total) by mouth 2 (two) times daily.  . metFORMIN (GLUCOPHAGE-XR) 750 MG 24 hr tablet Take 1 tablet (750 mg total) by mouth daily with breakfast.  . metoprolol succinate (TOPROL XL) 50 MG 24 hr tablet Take 1 tablet (50 mg total) by mouth daily. Take with or immediately following a meal.  . Multiple Vitamins-Minerals (OCUVITE PRESERVISION) TABS Take 1 tablet by mouth 2 (two) times daily.  . multivitamin-iron-minerals-folic acid (CENTRUM) chewable tablet Chew 1 tablet by mouth daily.  [name] omeprazole (PRILOSEC) 40 MG capsule TAKE 1 CAPSULE EVERY DAY  . potassium chloride SA (K-DUR,KLOR-CON) 20 MEQ tablet Take 1 tablet (20 mEq total) by mouth daily.  . sacubitril-valsartan (ENTRESTO) 24-26 MG Take 1 tablet by mouth 2 (two) times daily.  . temazepam (RESTORIL) 15 MG capsule Take 1 capsule (15 mg total) by mouth at bedtime as needed for sleep.  [name] tetrahydrozoline 0.05 % ophthalmic solution Place 1 drop into both eyes daily.  [name] venlafaxine XR (EFFEXOR-XR) 75 MG 24 hr capsule TAKE 1 CAPSULE EVERY DAY WITH BREAKFAST  . HYDROcodone-acetaminophen (NORCO/VICODIN) 5-325 MG tablet Take 1 tablet by mouth every 8 (eight) hours as needed for moderate pain. (Patient not taking: Reported on 09/04/2017)   No facility-administered encounter medications on file as of 09/04/2017.     Functional Status:  In your present state of health, do you have any difficulty performing the following  activities: 07/12/2017 07/06/2017  Hearing? N N  Vision? N N  Difficulty concentrating or making decisions? N N  Walking or climbing stairs? Y Y  Dressing or bathing? N N  Doing errands, shopping? N N  Preparing Food and eating ? - -  Using the Toilet? - -  In the past six months, have you accidently leaked urine? - -  Do you have problems with loss of bowel control? - -  Managing your Medications? - -  Managing your Finances? - -  Housekeeping or managing your Housekeeping? - -  Some recent data might be hidden    Fall/Depression  Screening: Fall Risk  07/12/2017 12/20/2016 12/08/2016  Falls in the past year? No No No  Number falls in past yr: - - -  Injury with Fall? - - -  Risk for fall due to : - Impaired balance/gait History of fall(s)  Follow up - - -   PHQ 2/9 Scores 07/12/2017 12/20/2016 12/08/2016 07/10/2016 10/27/2015 10/19/2014 09/24/2014  PHQ - 2 Score 2 2 0 - 0 0 0  PHQ- 9 Score 3 3 - - - - -  Exception Documentation - - - Medical reason - - -    Assessment: Patient will continue to benefit from health coach outreach for disease management and support.  THN CM Care Plan Problem One     Most Recent Value  THN Long Term Goal   In 90 days the patient will verbalize not having an admission in the [place] Term Goal Start Date  09/04/17  Interventions for Problem One Long Term Goal  reinterated signs and symptoms of heart failure, discussed with patient `, encouraged patient to weigh daily, verified with patient correct dose of fluid pills he should be taking from his last appointment with his cardiologist, encouraged medication compliance  THN CM Short Term Goal #1   In 30 days the patient will be able to verbalize two signs and symptoms in red zone heart failure action plan  [facility] CM Short Term Goal #1 Start Date  09/04/17  [facility] CM Short Term Goal #1 Met Date  09/04/17  Interventions for Short Term Goal #1  The patient was able to state 2 signs and symptoms in red zone     Plan: RN Health Coach will contact patient in the month of July and patient agrees to next outreach.  Lazaro Arms RN, BSN, [place] Direct Dial:  [phone]  Fax: [phone]

## 2017-09-12 ENCOUNTER — Encounter: Payer: Medicare HMO | Admitting: Internal Medicine

## 2017-09-21 ENCOUNTER — Telehealth: Payer: Self-pay | Admitting: Internal Medicine

## 2017-09-21 NOTE — Telephone Encounter (Signed)
Copied from [place] [id number]. Topic: Quick Communication - Rx Refill/Question >> Sep 21, 2017  2:12 PM Bea Graff, NT wrote: Medication: temazepam (RESTORIL) 15 MG capsule   Has the patient contacted their pharmacy? Yes.   (Agent: If no, request that the patient contact the pharmacy for the refill.) (Agent: If yes, when and what did the pharmacy advise?)  Preferred Pharmacy (with phone number or street name): [id number] [place], [place] [phone] (Phone) [phone] (Fax)      Agent: Please be advised that RX refills may take up to 3 business days. We ask that you follow-up with your pharmacy.

## 2017-09-24 NOTE — Telephone Encounter (Signed)
Spoke tp [atient and told her  to [facility]  Should still have 1 refill.

## 2017-09-24 NOTE — Telephone Encounter (Signed)
Pt calling back about her refill on her Restoril

## 2017-09-27 ENCOUNTER — Encounter: Payer: Self-pay | Admitting: Cardiology

## 2017-09-27 ENCOUNTER — Ambulatory Visit (INDEPENDENT_AMBULATORY_CARE_PROVIDER_SITE_OTHER): Payer: Medicare HMO | Admitting: *Deleted

## 2017-09-27 DIAGNOSIS — I441 Atrioventricular block, second degree: Secondary | ICD-10-CM

## 2017-09-27 NOTE — Progress Notes (Signed)
Remote pacemaker transmission.   

## 2017-09-28 LAB — CUP PACEART REMOTE DEVICE CHECK
Implantable Lead Implant Date: 20140710
Implantable Lead Location: 753860
Implantable Pulse Generator Implant Date: 20140710
Lead Channel Impedance Value: 67 Ohm
Lead Channel Impedance Value: 790 Ohm
Lead Channel Pacing Threshold Amplitude: 0.5 V
Lead Channel Pacing Threshold Pulse Width: 0.4 ms
MDC IDC LEAD IMPLANT DT: 20140710
MDC IDC LEAD LOCATION: 753859
MDC IDC MSMT BATTERY IMPEDANCE: 279 Ohm
MDC IDC MSMT BATTERY REMAINING LONGEVITY: 118 mo
MDC IDC MSMT BATTERY VOLTAGE: 2.79 V
MDC IDC SESS DTM: 20190627155452
MDC IDC SET LEADCHNL RV PACING AMPLITUDE: 2.5 V
MDC IDC SET LEADCHNL RV PACING PULSEWIDTH: 0.4 ms
MDC IDC SET LEADCHNL RV SENSING SENSITIVITY: 4 mV
MDC IDC STAT BRADY RV PERCENT PACED: 98 %

## 2017-10-08 ENCOUNTER — Ambulatory Visit: Payer: Self-pay

## 2017-10-09 ENCOUNTER — Ambulatory Visit: Payer: Self-pay

## 2017-10-10 ENCOUNTER — Other Ambulatory Visit: Payer: Self-pay

## 2017-10-10 NOTE — Patient Outreach (Signed)
[facility]) Care Management  [place]  10/10/2017   ARIBA LEHNEN 1933/12/30 [id number]  Subjective: Telephone call placed to the patient for assessment. HIPAA verified. The patient states that she has been doing fine.  She denies any chest pain, shortness of breath, swelling or falls.  The patient stated that she had jut recently woke up and not really started her day.  She had not weighed or checked her blood sugar this morning.  I encouraged the patient to weigh daily and monitor her blood sugars.  She verbalized understanding. The patient states that she is adherent with her medications.  Discussed with the patient about the Heart Failure action plan and she state " I am familiar with the zones".  The patient states that she has been sneezing and having a cough but feels that it is allergies.   The notified me that she has an appointment with Dr Alain Marion 11/23/2017.    Encounter Medications:  Outpatient Encounter Medications as of 10/10/2017  Medication Sig  . allopurinol (ZYLOPRIM) 100 MG tablet Take 100 mg by mouth daily.  [name] b complex vitamins tablet Take 1 tablet by mouth daily.  . Cholecalciferol 1000 UNITS tablet Take 1,000 Units by mouth daily.    [name] ELIQUIS 5 MG TABS tablet TAKE 1 TABLET TWICE DAILY  . furosemide (LASIX) 40 MG tablet Take 1 tablet (40 mg total) by mouth 2 (two) times daily.  [name] levothyroxine (SYNTHROID, LEVOTHROID) 75 MCG tablet TAKE 1 TABLET EVERY DAY  . LORazepam (ATIVAN) 0.5 MG tablet Take 1 tablet (0.5 mg total) by mouth 2 (two) times daily.  . metFORMIN (GLUCOPHAGE-XR) 750 MG 24 hr tablet Take 1 tablet (750 mg total) by mouth daily with breakfast.  . metoprolol succinate (TOPROL XL) 50 MG 24 hr tablet Take 1 tablet (50 mg total) by mouth daily. Take with or immediately following a meal.  . multivitamin-iron-minerals-folic acid (CENTRUM) chewable tablet Chew 1 tablet by mouth daily.  [name] omeprazole (PRILOSEC) 40 MG capsule TAKE 1  CAPSULE EVERY DAY  . potassium chloride SA (K-DUR,KLOR-CON) 20 MEQ tablet Take 1 tablet (20 mEq total) by mouth daily.  . sacubitril-valsartan (ENTRESTO) 24-26 MG Take 1 tablet by mouth 2 (two) times daily.  . temazepam (RESTORIL) 15 MG capsule Take 1 capsule (15 mg total) by mouth at bedtime as needed for sleep.  [name] tetrahydrozoline 0.05 % ophthalmic solution Place 1 drop into both eyes daily.  [name] venlafaxine XR (EFFEXOR-XR) 75 MG 24 hr capsule TAKE 1 CAPSULE EVERY DAY WITH BREAKFAST  . acetaminophen (TYLENOL) 325 MG tablet Take 2 tablets (650 mg total) by mouth every 6 (six) hours as needed for mild pain (or Fever >/= 101). (Patient not taking: Reported on 10/10/2017)  . HYDROcodone-acetaminophen (NORCO/VICODIN) 5-325 MG tablet Take 1 tablet by mouth every 8 (eight) hours as needed for moderate pain. (Patient not taking: Reported on 09/04/2017)  . Multiple Vitamins-Minerals (OCUVITE PRESERVISION) TABS Take 1 tablet by mouth 2 (two) times daily.   No facility-administered encounter medications on file as of 10/10/2017.     Functional Status:  In your present state of health, do you have any difficulty performing the following activities: 07/12/2017 07/06/2017  Hearing? N N  Vision? N N  Difficulty concentrating or making decisions? N N  Walking or climbing stairs? Y Y  Dressing or bathing? N N  Doing errands, shopping? N N  Preparing Food and eating ? - -  Using the Toilet? - -  In  the past six months, have you accidently leaked urine? - -  Do you have problems with loss of bowel control? - -  Managing your Medications? - -  Managing your Finances? - -  Housekeeping or managing your Housekeeping? - -  Some recent data might be hidden    Fall/Depression Screening: Fall Risk  10/10/2017 07/12/2017 12/20/2016  Falls in the past year? No No No  Number falls in past yr: - - -  Injury with Fall? - - -  Risk for fall due to : - - Impaired balance/gait  Follow up - - -   PHQ 2/9 Scores 07/12/2017  12/20/2016 12/08/2016 07/10/2016 10/27/2015 10/19/2014 09/24/2014  PHQ - 2 Score 2 2 0 - 0 0 0  PHQ- 9 Score 3 3 - - - - -  Exception Documentation - - - Medical reason - - -    Assessment: Patient will continue to benefit from health coach outreach for disease management and support.   THN CM Care Plan Problem One     Most Recent Value  THN Long Term Goal   In 90 days the patient will verbalize not having an admission in the [place] Term Goal Start Date  10/10/17  Interventions for Problem One Long Term Goal  Encouraged the patient to weigh, check blood sugars daily and medication adherence     Plan:  RN Health Coach will contact patient in the month of August and patient agrees to next outreach.  Lazaro Arms RN, BSN, [place] Direct Dial:  [phone]  Fax: [phone]

## 2017-10-17 ENCOUNTER — Ambulatory Visit (HOSPITAL_COMMUNITY): Payer: Medicare HMO | Attending: Cardiovascular Disease

## 2017-10-23 ENCOUNTER — Other Ambulatory Visit: Payer: Self-pay | Admitting: Internal Medicine

## 2017-10-24 ENCOUNTER — Ambulatory Visit: Payer: Medicare HMO | Admitting: Cardiovascular Disease

## 2017-10-30 ENCOUNTER — Ambulatory Visit (HOSPITAL_COMMUNITY): Payer: Medicare HMO | Attending: Cardiology

## 2017-10-30 ENCOUNTER — Other Ambulatory Visit: Payer: Self-pay

## 2017-10-30 DIAGNOSIS — N289 Disorder of kidney and ureter, unspecified: Secondary | ICD-10-CM | POA: Diagnosis not present

## 2017-10-30 DIAGNOSIS — Z950 Presence of cardiac pacemaker: Secondary | ICD-10-CM | POA: Diagnosis not present

## 2017-10-30 DIAGNOSIS — I110 Hypertensive heart disease with heart failure: Secondary | ICD-10-CM | POA: Diagnosis not present

## 2017-10-30 DIAGNOSIS — I4891 Unspecified atrial fibrillation: Secondary | ICD-10-CM | POA: Diagnosis not present

## 2017-10-30 DIAGNOSIS — Z86718 Personal history of other venous thrombosis and embolism: Secondary | ICD-10-CM | POA: Diagnosis not present

## 2017-10-30 DIAGNOSIS — I5042 Chronic combined systolic (congestive) and diastolic (congestive) heart failure: Secondary | ICD-10-CM

## 2017-10-30 DIAGNOSIS — Z6841 Body Mass Index (BMI) 40.0 and over, adult: Secondary | ICD-10-CM | POA: Diagnosis not present

## 2017-10-30 DIAGNOSIS — E785 Hyperlipidemia, unspecified: Secondary | ICD-10-CM | POA: Diagnosis not present

## 2017-10-30 DIAGNOSIS — I350 Nonrheumatic aortic (valve) stenosis: Secondary | ICD-10-CM

## 2017-10-30 DIAGNOSIS — Z86711 Personal history of pulmonary embolism: Secondary | ICD-10-CM | POA: Insufficient documentation

## 2017-10-30 DIAGNOSIS — E039 Hypothyroidism, unspecified: Secondary | ICD-10-CM | POA: Insufficient documentation

## 2017-10-30 DIAGNOSIS — I082 Rheumatic disorders of both aortic and tricuspid valves: Secondary | ICD-10-CM | POA: Insufficient documentation

## 2017-10-30 DIAGNOSIS — E1142 Type 2 diabetes mellitus with diabetic polyneuropathy: Secondary | ICD-10-CM | POA: Diagnosis not present

## 2017-10-30 DIAGNOSIS — E669 Obesity, unspecified: Secondary | ICD-10-CM | POA: Insufficient documentation

## 2017-10-30 MED ORDER — PERFLUTREN LIPID MICROSPHERE
1.0000 mL | INTRAVENOUS | Status: AC | PRN
  Administered 2017-10-30: 2 mL via INTRAVENOUS

## 2017-11-08 ENCOUNTER — Ambulatory Visit: Payer: Medicare HMO | Admitting: Cardiovascular Disease

## 2017-11-09 ENCOUNTER — Telehealth: Payer: Self-pay | Admitting: Internal Medicine

## 2017-11-09 MED ORDER — METOPROLOL SUCCINATE ER 50 MG PO TB24: 50.0000 mg | ORAL_TABLET | Freq: Every day | ORAL | 1 refills | Status: DC

## 2017-11-09 MED ORDER — FUROSEMIDE 40 MG PO TABS: 40.0000 mg | ORAL_TABLET | Freq: Two times a day (BID) | ORAL | 1 refills | Status: DC

## 2017-11-09 NOTE — Addendum Note (Signed)
Addended by: Earnstine Regal on: 11/09/2017 04:10 PM   Modules accepted: Orders

## 2017-11-09 NOTE — Telephone Encounter (Signed)
metoprolol refill Last Refill: 08/17/17 # 90 Last OV: 07/11/17 PCP:Dr Jellico: [facility] [place], [address]  Furosemide refill Last Refill: 08/17/17 # 60 Last OV: 07/11/17 PCP: Dr Walker Kehr Pharmacy:  [facility] [place], [place]

## 2017-11-09 NOTE — Telephone Encounter (Signed)
Copied from [place] [id number]. Topic: Quick Communication - Rx Refill/Question >> Nov 09, 2017  1:58 PM Reyne Dumas L wrote: Medication:  furosemide (LASIX) 40 MG tablet metoprolol succinate (TOPROL XL) 50 MG 24 hr tablet  Has the patient contacted their pharmacy? No - pt will be using new pharmacy (Agent: If no, request that the patient contact the pharmacy for the refill.) (Agent: If yes, when and what did the pharmacy advise?)  Preferred Pharmacy (with phone number or street name): [place], [place] [phone] (Phone) [phone] (Fax)  Agent: Please be advised that RX refills may take up to 3 business days. We ask that you follow-up with your pharmacy.

## 2017-11-09 NOTE — Telephone Encounter (Signed)
Reviewed chart pt is up-to-date sent refills to [facility]  

## 2017-11-12 ENCOUNTER — Other Ambulatory Visit: Payer: Self-pay

## 2017-11-12 NOTE — Patient Outreach (Signed)
[facility]) Care Management  11/12/2017   Jessica [name] 1934/02/25 [id number]  Subjective: Telephone call to the patient for assessment. HIPAA verified.  The patient states that she is doing good today.  Her weight today was 245.  She denies any chest pain, swelling or falls.  She reports that she did have some chest pain last Monday.  It was a sharp pain that ran across her right breast. It did not last long in duration. She states that the pain was not a lot to alarm her. I reviewed signs and symptoms of heart failure and discussed with patient CHF action plan.  She verbalized understanding. She is taking her medications as prescribed.  The patient states she [facility] and had an echo.  The results have not changed since last year.  She states that she has her sister living with her now and is helping with her care.  She has an appointment with Dr Alain Marion next Friday.  Objective:   Current Medications:  Current Outpatient Medications  Medication Sig Dispense Refill  . allopurinol (ZYLOPRIM) 100 MG tablet Take 100 mg by mouth daily.    [name] b complex vitamins tablet Take 1 tablet by mouth daily.    . Blood Glucose Monitoring Suppl (TRUE METRIX AIR GLUCOSE METER) w/Device KIT CHECK BLOOD SUGAR TWICE DAILY 1 kit 0  . Cholecalciferol 1000 UNITS tablet Take 1,000 Units by mouth daily.      [name] ELIQUIS 5 MG TABS tablet TAKE 1 TABLET TWICE DAILY 180 tablet 3  . furosemide (LASIX) 40 MG tablet Take 1 tablet (40 mg total) by mouth 2 (two) times daily. 180 tablet 1  . levothyroxine (SYNTHROID, LEVOTHROID) 75 MCG tablet TAKE 1 TABLET EVERY DAY 90 tablet 3  . LORazepam (ATIVAN) 0.5 MG tablet Take 1 tablet (0.5 mg total) by mouth 2 (two) times daily. 180 tablet 1  . metFORMIN (GLUCOPHAGE-XR) 750 MG 24 hr tablet Take 1 tablet (750 mg total) by mouth daily with breakfast. 90 tablet 3  . metoprolol succinate (TOPROL XL) 50 MG 24 hr tablet Take 1  tablet (50 mg total) by mouth daily. Take with or immediately following a meal. 90 tablet 1  . Multiple Vitamins-Minerals (OCUVITE PRESERVISION) TABS Take 1 tablet by mouth 2 (two) times daily.    . multivitamin-iron-minerals-folic acid (CENTRUM) chewable tablet Chew 1 tablet by mouth daily.    [name] omeprazole (PRILOSEC) 40 MG capsule TAKE 1 CAPSULE EVERY DAY 90 capsule 3  . potassium chloride SA (K-DUR,KLOR-CON) 20 MEQ tablet Take 1 tablet (20 mEq total) by mouth daily. 90 tablet 1  . sacubitril-valsartan (ENTRESTO) 24-26 MG Take 1 tablet by mouth 2 (two) times daily. 60 tablet 5  . temazepam (RESTORIL) 15 MG capsule Take 1 capsule (15 mg total) by mouth at bedtime as needed for sleep. 90 capsule 1  . tetrahydrozoline 0.05 % ophthalmic solution Place 1 drop into both eyes daily.    [name] venlafaxine XR (EFFEXOR-XR) 75 MG 24 hr capsule TAKE 1 CAPSULE EVERY DAY WITH BREAKFAST 90 capsule 2  . acetaminophen (TYLENOL) 325 MG tablet Take 2 tablets (650 mg total) by mouth every 6 (six) hours as needed for mild pain (or Fever >/= 101). (Patient not taking: Reported on 10/10/2017)    . HYDROcodone-acetaminophen (NORCO/VICODIN) 5-325 MG tablet Take 1 tablet by mouth every 8 (eight) hours as needed for moderate pain. (Patient not taking: Reported on 09/04/2017) 100 tablet 0  No current facility-administered medications for this visit.     Functional Status:  In your present state of health, do you have any difficulty performing the following activities: 07/12/2017 07/06/2017  Hearing? N N  Vision? N N  Difficulty concentrating or making decisions? N N  Walking or climbing stairs? Y Y  Dressing or bathing? N N  Doing errands, shopping? N N  Preparing Food and eating ? - -  Using the Toilet? - -  In the past six months, have you accidently leaked urine? - -  Do you have problems with loss of bowel control? - -  Managing your Medications? - -  Managing your Finances? - -  Housekeeping or managing your  Housekeeping? - -  Some recent data might be hidden    Fall/Depression Screening: Fall Risk  11/12/2017 10/10/2017 07/12/2017  Falls in the past year? No No No  Number falls in past yr: - - -  Injury with Fall? - - -  Risk for fall due to : - - -  Follow up - - -   PHQ 2/9 Scores 07/12/2017 12/20/2016 12/08/2016 07/10/2016 10/27/2015 10/19/2014 09/24/2014  PHQ - 2 Score 2 2 0 - 0 0 0  PHQ- 9 Score 3 3 - - - - -  Exception Documentation - - - Medical reason - - -    Assessment: Patient will continue to benefit from health coach outreach for disease management and support.  THN CM Care Plan Problem One     Most Recent Value  THN Long Term Goal   In 90 days the patient will verbalize not having an admission in the [place] Term Goal Start Date  11/12/17  Interventions for Problem One Long Term Goal  Reviewed signs and symptoms of heart failure, discussed with patient chf action plan, encouraged patient to weigh daily       Plan: [place] will contact patient in the month of September and patient agrees to next outreach.  Lazaro Arms RN, BSN, [place] Direct Dial:  [phone]  Fax: [phone]

## 2017-11-13 ENCOUNTER — Telehealth: Payer: Self-pay | Admitting: Internal Medicine

## 2017-11-13 DIAGNOSIS — R220 Localized swelling, mass and lump, head: Secondary | ICD-10-CM

## 2017-11-13 NOTE — Telephone Encounter (Signed)
Copied from [place] [id number]. Topic: Referral - Request >> Nov 13, 2017  9:22 AM Cecelia Byars, NT wrote: Reason for CRM: Dr Nicki Reaper  [place] office called and is requesting an out of network referral for the patient to have a biopsy to  evaluate a bump on the mandible mid line,  please fax [phone]   or call call [phone]

## 2017-11-14 NOTE — Telephone Encounter (Signed)
Ok to refer Thx 

## 2017-11-14 NOTE — Telephone Encounter (Signed)
Please advise about referral 

## 2017-11-15 NOTE — Telephone Encounter (Signed)
Referral made 

## 2017-11-23 ENCOUNTER — Other Ambulatory Visit (INDEPENDENT_AMBULATORY_CARE_PROVIDER_SITE_OTHER): Payer: Medicare HMO

## 2017-11-23 ENCOUNTER — Encounter: Payer: Self-pay | Admitting: Internal Medicine

## 2017-11-23 ENCOUNTER — Ambulatory Visit (INDEPENDENT_AMBULATORY_CARE_PROVIDER_SITE_OTHER): Payer: Medicare HMO | Admitting: Internal Medicine

## 2017-11-23 DIAGNOSIS — R600 Localized edema: Secondary | ICD-10-CM

## 2017-11-23 DIAGNOSIS — E118 Type 2 diabetes mellitus with unspecified complications: Secondary | ICD-10-CM

## 2017-11-23 DIAGNOSIS — I480 Paroxysmal atrial fibrillation: Secondary | ICD-10-CM

## 2017-11-23 DIAGNOSIS — R0609 Other forms of dyspnea: Secondary | ICD-10-CM

## 2017-11-23 DIAGNOSIS — I5043 Acute on chronic combined systolic (congestive) and diastolic (congestive) heart failure: Secondary | ICD-10-CM | POA: Diagnosis not present

## 2017-11-23 LAB — HEMOGLOBIN A1C: HEMOGLOBIN A1C: 8.9 % — AB (ref 4.6–6.5)

## 2017-11-23 LAB — BASIC METABOLIC PANEL
BUN: 26 mg/dL — ABNORMAL HIGH (ref 6–23)
CHLORIDE: 98 meq/L (ref 96–112)
CO2: 29 meq/L (ref 19–32)
Calcium: 10.4 mg/dL (ref 8.4–10.5)
Creatinine, Ser: 0.98 mg/dL (ref 0.40–1.20)
GFR: 57.72 mL/min — ABNORMAL LOW (ref >60.00)
Glucose, Bld: 225 mg/dL — ABNORMAL HIGH (ref 70–99)
POTASSIUM: 4.1 meq/L (ref 3.5–5.1)
SODIUM: 136 meq/L (ref 135–145)

## 2017-11-23 MED ORDER — ZOSTER VAC RECOMB ADJUVANTED 50 MCG/0.5ML IM SUSR: 0.5000 mL | Freq: Once | INTRAMUSCULAR | 1 refills | Status: AC

## 2017-11-23 NOTE — Assessment & Plan Note (Signed)
Eliquis 

## 2017-11-23 NOTE — Progress Notes (Signed)
Subjective:  Patient ID: Jessica [name], female    DOB: 12-14-1933  Age: 84 y.o. MRN: [id number]  CC: No chief complaint on file.   HPI Jessica [name] presents for LE edema, gout, DVT, CHF f/u  Planning to have an oral lesion removed in [place] by Dr Romie Minus   Outpatient Medications Prior to Visit  Medication Sig Dispense Refill  . acetaminophen (TYLENOL) 325 MG tablet Take 2 tablets (650 mg total) by mouth every 6 (six) hours as needed for mild pain (or Fever >/= 101).    [name] allopurinol (ZYLOPRIM) 100 MG tablet Take 100 mg by mouth daily.    [name] b complex vitamins tablet Take 1 tablet by mouth daily.    . Blood Glucose Monitoring Suppl (TRUE METRIX AIR GLUCOSE METER) w/Device KIT CHECK BLOOD SUGAR TWICE DAILY 1 kit 0  . Cholecalciferol 1000 UNITS tablet Take 1,000 Units by mouth daily.      [name] ELIQUIS 5 MG TABS tablet TAKE 1 TABLET TWICE DAILY 180 tablet 3  . furosemide (LASIX) 40 MG tablet Take 1 tablet (40 mg total) by mouth 2 (two) times daily. 180 tablet 1  . HYDROcodone-acetaminophen (NORCO/VICODIN) 5-325 MG tablet Take 1 tablet by mouth every 8 (eight) hours as needed for moderate pain. 100 tablet 0  . levothyroxine (SYNTHROID, LEVOTHROID) 75 MCG tablet TAKE 1 TABLET EVERY DAY 90 tablet 3  . LORazepam (ATIVAN) 0.5 MG tablet Take 1 tablet (0.5 mg total) by mouth 2 (two) times daily. 180 tablet 1  . metFORMIN (GLUCOPHAGE-XR) 750 MG 24 hr tablet Take 1 tablet (750 mg total) by mouth daily with breakfast. 90 tablet 3  . metoprolol succinate (TOPROL XL) 50 MG 24 hr tablet Take 1 tablet (50 mg total) by mouth daily. Take with or immediately following a meal. 90 tablet 1  . Multiple Vitamins-Minerals (OCUVITE PRESERVISION) TABS Take 1 tablet by mouth 2 (two) times daily.    . multivitamin-iron-minerals-folic acid (CENTRUM) chewable tablet Chew 1 tablet by mouth daily.    [name] omeprazole (PRILOSEC) 40 MG capsule TAKE 1 CAPSULE EVERY DAY 90 capsule 3  . potassium chloride SA (K-DUR,KLOR-CON) 20  MEQ tablet Take 1 tablet (20 mEq total) by mouth daily. 90 tablet 1  . sacubitril-valsartan (ENTRESTO) 24-26 MG Take 1 tablet by mouth 2 (two) times daily. 60 tablet 5  . temazepam (RESTORIL) 15 MG capsule Take 1 capsule (15 mg total) by mouth at bedtime as needed for sleep. 90 capsule 1  . tetrahydrozoline 0.05 % ophthalmic solution Place 1 drop into both eyes daily.    [name] venlafaxine XR (EFFEXOR-XR) 75 MG 24 hr capsule TAKE 1 CAPSULE EVERY DAY WITH BREAKFAST 90 capsule 2   No facility-administered medications prior to visit.     ROS: Review of Systems  Constitutional: Positive for fatigue. Negative for activity change, appetite change, chills and unexpected weight change.  HENT: Negative for congestion, mouth sores and sinus pressure.   Eyes: Negative for visual disturbance.  Respiratory: Positive for shortness of breath. Negative for cough and chest tightness.   Cardiovascular: Positive for leg swelling.  Gastrointestinal: Negative for abdominal pain and nausea.  Genitourinary: Negative for difficulty urinating, frequency and vaginal pain.  Musculoskeletal: Positive for back pain and gait problem.  Skin: Negative for pallor and rash.  Neurological: Negative for dizziness, tremors, weakness, numbness and headaches.  Psychiatric/Behavioral: Positive for decreased concentration and sleep disturbance. Negative for confusion. The patient is nervous/anxious.     Objective:  BP 132/78 (BP  Location: Right Arm, Patient Position: Sitting, Cuff Size: Normal)   Pulse 87   Temp 98 F (36.7 C) (Oral)   Ht 5' 2" (1.575 m)   Wt 248 lb (112.5 kg)   SpO2 96%   BMI 45.36 kg/m   BP Readings from Last 3 Encounters:  11/23/17 132/78  08/23/17 128/76  08/17/17 131/72    Wt Readings from Last 3 Encounters:  11/23/17 248 lb (112.5 kg)  08/23/17 245 lb (111.1 kg)  08/17/17 245 lb 12.8 oz (111.5 kg)    Physical Exam  Constitutional: She appears well-developed. No distress.  HENT:  Head:  Normocephalic.  Right Ear: External ear normal.  Left Ear: External ear normal.  Nose: Nose normal.  Mouth/Throat: Oropharynx is clear and moist.  Eyes: Pupils are equal, round, and reactive to light. Conjunctivae are normal. Right eye exhibits no discharge. Left eye exhibits no discharge.  Neck: Normal range of motion. Neck supple. No JVD present. No tracheal deviation present. No thyromegaly present.  Cardiovascular: Normal rate, regular rhythm and normal heart sounds.  Pulmonary/Chest: No stridor. No respiratory distress. She has no wheezes.  Abdominal: Soft. Bowel sounds are normal. She exhibits no distension and no mass. There is no tenderness. There is no rebound and no guarding.  Musculoskeletal: She exhibits edema and tenderness.  Lymphadenopathy:    She has no cervical adenopathy.  Neurological: She displays normal reflexes. No cranial nerve deficit. She exhibits normal muscle tone. Coordination abnormal.  Skin: No rash noted. No erythema.  Psychiatric: She has a normal mood and affect. Her behavior is normal. Judgment and thought content normal.  hyperpigmented purple lower shins Obese Lower jaw gum growth Firm edema No ulcers  Lab Results  Component Value Date   WBC 7.5 06/26/2017   HGB 13.2 06/26/2017   HCT 40.2 06/26/2017   PLT 360 06/26/2017   GLUCOSE 195 (H) 08/23/2017   CHOL 191 02/19/2017   TRIG 183.0 (H) 02/19/2017   HDL 38.90 (L) 02/19/2017   LDLDIRECT 155.9 09/30/2012   LDLCALC 116 (H) 02/19/2017   ALT 15 06/22/2017   AST 24 06/22/2017   NA 137 08/23/2017   K 4.6 08/23/2017   CL 100 08/23/2017   CREATININE 1.09 (H) 08/23/2017   BUN 22 08/23/2017   CO2 20 08/23/2017   TSH 2.771 06/22/2017   INR 1.68 06/23/2017   HGBA1C 6.4 (H) 06/22/2017   MICROALBUR 4.5 (H) 12/02/2009    No results found.  Assessment & Plan:   There are no diagnoses linked to this encounter.   No orders of the defined types were placed in this encounter.    Follow-up: No  follow-ups on file.  Walker Kehr, MD

## 2017-11-23 NOTE — Assessment & Plan Note (Signed)
Needs to loose wt 

## 2017-11-23 NOTE — Assessment & Plan Note (Signed)
Better on Entresto

## 2017-11-23 NOTE — Assessment & Plan Note (Signed)
labs

## 2017-11-23 NOTE — Assessment & Plan Note (Signed)
Entresto

## 2017-11-25 ENCOUNTER — Other Ambulatory Visit: Payer: Self-pay | Admitting: Internal Medicine

## 2017-11-26 ENCOUNTER — Telehealth: Payer: Self-pay

## 2017-11-26 NOTE — Telephone Encounter (Signed)
Spoke with patient today. Message sent to Dr. Alain Marion to send in script per patient and she will decide based on price if she can take shots or not.

## 2017-11-26 NOTE — Telephone Encounter (Signed)
Copied from [place] [id number]. Topic: Quick Communication - Office Called Patient >> Nov 26, 2017  1:39 PM Marcina Millard, CMA wrote: Reason for CRM: Called and left message for patient. Should she return call to clinic okay to release the following to her:  Please inform the patient that all labs are stable, except for very high sugars.  Please find out if she is agreeable to take self-shots weekly for DM (not insulin)? It will help with weight loss too. Please let me know her response.  Thanks >> Nov 26, 2017  2:35 PM Jarold Motto, [place] wrote: Pt would like a call back. She stated that she was on hold for 10 minutes. Please advise

## 2017-11-26 NOTE — Telephone Encounter (Signed)
Pt called earlier to get more info on the self injections and what Plot would be prescribing , she wants to find out if it is covered by insurance.

## 2017-11-28 ENCOUNTER — Ambulatory Visit: Payer: Medicare HMO | Admitting: Emergency Medicine

## 2017-11-28 ENCOUNTER — Other Ambulatory Visit: Payer: Self-pay | Admitting: Internal Medicine

## 2017-11-28 DIAGNOSIS — E118 Type 2 diabetes mellitus with unspecified complications: Secondary | ICD-10-CM

## 2017-11-28 MED ORDER — SEMAGLUTIDE(0.25 OR 0.5MG/DOS) 2 MG/1.5ML ~~LOC~~ SOPN: 0.5000 mg | PEN_INJECTOR | SUBCUTANEOUS | 11 refills | Status: DC

## 2017-11-28 NOTE — Progress Notes (Signed)
Pt came in for nurse visit for teaching on [place]. Pt was given instructions and showed understanding and gave herself injection with sampled given without any complications.

## 2017-12-10 ENCOUNTER — Ambulatory Visit: Payer: Medicare HMO | Admitting: Internal Medicine

## 2017-12-10 ENCOUNTER — Encounter: Payer: Self-pay | Admitting: Internal Medicine

## 2017-12-10 VITALS — BP 126/78 | HR 120 | Ht 62.0 in | Wt 247.4 lb

## 2017-12-10 DIAGNOSIS — I350 Nonrheumatic aortic (valve) stenosis: Secondary | ICD-10-CM | POA: Diagnosis not present

## 2017-12-10 DIAGNOSIS — I10 Essential (primary) hypertension: Secondary | ICD-10-CM

## 2017-12-10 DIAGNOSIS — I442 Atrioventricular block, complete: Secondary | ICD-10-CM

## 2017-12-10 DIAGNOSIS — I482 Chronic atrial fibrillation: Secondary | ICD-10-CM | POA: Diagnosis not present

## 2017-12-10 DIAGNOSIS — Z950 Presence of cardiac pacemaker: Secondary | ICD-10-CM | POA: Diagnosis not present

## 2017-12-10 DIAGNOSIS — I4892 Unspecified atrial flutter: Secondary | ICD-10-CM | POA: Diagnosis not present

## 2017-12-10 DIAGNOSIS — I4821 Permanent atrial fibrillation: Secondary | ICD-10-CM

## 2017-12-10 LAB — CUP PACEART INCLINIC DEVICE CHECK
Battery Impedance: 329 Ohm
Battery Remaining Longevity: 111 mo
Battery Voltage: 2.79 V
Date Time Interrogation Session: 20190909121631
Implantable Lead Implant Date: 20140710
Implantable Lead Implant Date: 20140710
Implantable Lead Location: 753859
Implantable Lead Location: 753860
Implantable Lead Model: 5092
Implantable Lead Model: 5592
Lead Channel Pacing Threshold Amplitude: 0.5 V
Lead Channel Pacing Threshold Amplitude: 0.5 V
Lead Channel Setting Pacing Amplitude: 2.5 V
Lead Channel Setting Pacing Pulse Width: 0.4 ms
Lead Channel Setting Sensing Sensitivity: 4 mV
MDC IDC MSMT LEADCHNL RA IMPEDANCE VALUE: 67 Ohm
MDC IDC MSMT LEADCHNL RV IMPEDANCE VALUE: 766 Ohm
MDC IDC MSMT LEADCHNL RV PACING THRESHOLD PULSEWIDTH: 0.4 ms
MDC IDC MSMT LEADCHNL RV PACING THRESHOLD PULSEWIDTH: 0.4 ms
MDC IDC PG IMPLANT DT: 20140710
MDC IDC STAT BRADY RV PERCENT PACED: 97 %

## 2017-12-10 NOTE — Patient Instructions (Addendum)
Medication Instructions:  Your physician recommends that you continue on your current medications as directed. Please refer to the Current Medication list given to you today.  Labwork: None ordered.  Testing/Procedures: None ordered.  Follow-Up: Your physician wants you to follow-up in: 3 months with Dr. Rayann Heman.     Remote monitoring is used to monitor your Pacemaker from home. This monitoring reduces the number of office visits required to check your device to one time per year. It allows [place] to keep an eye on the functioning of your device to ensure it is working properly. You are scheduled for a device check from home on 12/27/2017. You may send your transmission at any time that day. If you have a wireless device, the transmission will be sent automatically. After your physician reviews your transmission, you will receive a postcard with your next transmission date.  Any Other Special Instructions Will Be Listed Below (If Applicable).  If you need a refill on your cardiac medications before your next appointment, please call your pharmacy.
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
## 2017-12-12 NOTE — Progress Notes (Signed)
I personally reviewed the Note/Plan and I agree.  

## 2017-12-13 ENCOUNTER — Ambulatory Visit: Payer: Medicare HMO | Admitting: Cardiovascular Disease

## 2017-12-13 ENCOUNTER — Encounter: Payer: Self-pay | Admitting: Cardiovascular Disease

## 2017-12-13 DIAGNOSIS — I350 Nonrheumatic aortic (valve) stenosis: Secondary | ICD-10-CM | POA: Diagnosis not present

## 2017-12-13 DIAGNOSIS — I482 Chronic atrial fibrillation: Secondary | ICD-10-CM | POA: Diagnosis not present

## 2017-12-13 DIAGNOSIS — I4821 Permanent atrial fibrillation: Secondary | ICD-10-CM

## 2017-12-13 DIAGNOSIS — I5042 Chronic combined systolic (congestive) and diastolic (congestive) heart failure: Secondary | ICD-10-CM | POA: Diagnosis not present

## 2017-12-13 DIAGNOSIS — I441 Atrioventricular block, second degree: Secondary | ICD-10-CM
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
## 2017-12-13 NOTE — Patient Instructions (Signed)
Medication Instructions:  Your physician recommends that you continue on your current medications as directed. Please refer to the Current Medication list given to you today.  Labwork: NONE  Testing/Procedures: NONE  Follow-Up: Your physician wants you to follow-up in: 6 MONTH  You will receive a reminder letter in the mail two months in advance. If you don't receive a letter, please call our office to schedule the follow-up appointment.  If you need a refill on your cardiac medications before your next appointment, please call your pharmacy.  

## 2017-12-14 ENCOUNTER — Other Ambulatory Visit: Payer: Self-pay

## 2017-12-14 NOTE — Patient Outreach (Signed)
[facility]) Care Management  12/14/2017  TOMASITA BEEVERS 11-26-1933 [id number]    1st outreach to the patient for assessment. No answer. Line busy. Unable to leave a message.   Plan: Plan: RN Health Coach will make another outreach attempt to the patient within thirty days.  Lazaro Arms RN, BSN, [place] Direct Dial:  [phone]  Fax: [phone]

## 2017-12-18 ENCOUNTER — Other Ambulatory Visit: Payer: Self-pay

## 2017-12-18 NOTE — Patient Outreach (Signed)
[facility]) Care Management  12/18/2017   Jessica [name] [birth year] [id number]  Subjective: Telephone call to the patient for assessment. HIPAA verified. The patient states that she doing well. She denies any chest pain, swelling , cough or falls.  She states that she does have some shortness of breath with exertion and some weakness. The patient did not weigh today but yesterday she weighed 245 lbs.  She states that she saw [name] on Monday for a pacemaker check and Dr Oval Linsey her cardiologist last Thursday.  She states that she is taking her medications as prescribed.  She expressed concern for paying for her Eliquis, Entresto and Ozempic.  RN Health Coach put in a referral for medication assistance.  The process was explained to the patient and she agreed.    Current Medications:  Current Outpatient Medications  Medication Sig Dispense Refill  . allopurinol (ZYLOPRIM) 100 MG tablet Take 100 mg by mouth daily.    [name] b complex vitamins tablet Take 1 tablet by mouth daily.    . Blood Glucose Monitoring Suppl (TRUE METRIX AIR GLUCOSE METER) w/Device KIT CHECK BLOOD SUGAR TWICE DAILY 1 kit 0  . Cholecalciferol 1000 UNITS tablet Take 1,000 Units by mouth daily.      [name] ELIQUIS 5 MG TABS tablet TAKE 1 TABLET TWICE DAILY 180 tablet 3  . furosemide (LASIX) 40 MG tablet Take 1 tablet (40 mg total) by mouth 2 (two) times daily. 180 tablet 1  . levothyroxine (SYNTHROID, LEVOTHROID) 75 MCG tablet TAKE 1 TABLET EVERY DAY 90 tablet 3  . LORazepam (ATIVAN) 0.5 MG tablet Take 1 tablet (0.5 mg total) by mouth 2 (two) times daily. 180 tablet 1  . metFORMIN (GLUCOPHAGE-XR) 750 MG 24 hr tablet Take 1 tablet (750 mg total) by mouth daily with breakfast. 90 tablet 3  . metoprolol succinate (TOPROL XL) 50 MG 24 hr tablet Take 1 tablet (50 mg total) by mouth daily. Take with or immediately following a meal. 90 tablet 1  . Multiple Vitamins-Minerals (OCUVITE PRESERVISION) TABS Take 1 tablet  by mouth 2 (two) times daily.    . multivitamin-iron-minerals-folic acid (CENTRUM) chewable tablet Chew 1 tablet by mouth daily.    [name] omeprazole (PRILOSEC) 40 MG capsule TAKE 1 CAPSULE EVERY DAY 90 capsule 3  . potassium chloride SA (K-DUR,KLOR-CON) 20 MEQ tablet Take 1 tablet (20 mEq total) by mouth daily. 90 tablet 1  . sacubitril-valsartan (ENTRESTO) 24-26 MG Take 1 tablet by mouth 2 (two) times daily. 60 tablet 5  . Semaglutide (OZEMPIC) 0.25 or 0.5 MG/DOSE SOPN Inject 0.5 mg into the skin once a week. Start 0.25 mg weekly for 4 weeks, then go to 0.5 mg weekly sq 1 pen 11  . temazepam (RESTORIL) 15 MG capsule Take 1 capsule (15 mg total) by mouth at bedtime as needed for sleep. 90 capsule 1  . tetrahydrozoline 0.05 % ophthalmic solution Place 1 drop into both eyes daily.    [name] venlafaxine XR (EFFEXOR-XR) 75 MG 24 hr capsule TAKE 1 CAPSULE EVERY DAY WITH BREAKFAST 90 capsule 2  . acetaminophen (TYLENOL) 325 MG tablet Take 2 tablets (650 mg total) by mouth every 6 (six) hours as needed for mild pain (or Fever >/= 101). (Patient not taking: Reported on 12/18/2017)     No current facility-administered medications for this visit.     Functional Status:  In your present state of health, do you have any difficulty performing the following activities: 07/12/2017 07/06/2017  Hearing? N  N  Vision? N N  Difficulty concentrating or making decisions? N N  Walking or climbing stairs? Y Y  Dressing or bathing? N N  Doing errands, shopping? N N  Preparing Food and eating ? - -  Using the Toilet? - -  In the past six months, have you accidently leaked urine? - -  Do you have problems with loss of bowel control? - -  Managing your Medications? - -  Managing your Finances? - -  Housekeeping or managing your Housekeeping? - -  Some recent data might be hidden    Fall/Depression Screening: Fall Risk  12/18/2017 11/12/2017 10/10/2017  Falls in the past year? No No No  Number falls in past yr: - - -  Injury  with Fall? - - -  Risk for fall due to : - - -  Follow up - - -   PHQ 2/9 Scores 07/12/2017 12/20/2016 12/08/2016 07/10/2016 10/27/2015 10/19/2014 09/24/2014  PHQ - 2 Score 2 2 0 - 0 0 0  PHQ- 9 Score 3 3 - - - - -  Exception Documentation - - - Medical reason - - -    Assessment: Patient will continue to benefit from health coach outreach for disease management and support.  THN CM Care Plan Problem One     Most Recent Value  THN Long Term Goal   In 60 days the patient will verbalize not having an admission in the [place] Term Goal Start Date  12/18/17  Interventions for Problem One Long Term Goal  discussed heart failure action plan,  sins and symptoms of heart failure and meication adherence       Plan: [place] will contact patient in the month of November and patient agrees to next outreach.

## 2017-12-19 ENCOUNTER — Other Ambulatory Visit: Payer: Self-pay | Admitting: Cardiovascular Disease

## 2017-12-19 ENCOUNTER — Other Ambulatory Visit: Payer: Self-pay | Admitting: Internal Medicine
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
## 2017-12-21 ENCOUNTER — Other Ambulatory Visit: Payer: Self-pay | Admitting: Pharmacist

## 2017-12-21 ENCOUNTER — Ambulatory Visit: Payer: Self-pay | Admitting: Pharmacist

## 2017-12-21 ENCOUNTER — Ambulatory Visit (INDEPENDENT_AMBULATORY_CARE_PROVIDER_SITE_OTHER): Payer: Medicare HMO | Admitting: *Deleted

## 2017-12-21 VITALS — BP 124/74 | HR 62 | Resp 18 | Ht 62.0 in | Wt 248.0 lb

## 2017-12-21 DIAGNOSIS — Z23 Encounter for immunization: Secondary | ICD-10-CM

## 2017-12-21 DIAGNOSIS — Z0000 Encounter for general adult medical examination without abnormal findings: Secondary | ICD-10-CM

## 2017-12-21 DIAGNOSIS — E118 Type 2 diabetes mellitus with unspecified complications: Secondary | ICD-10-CM | POA: Diagnosis not present

## 2017-12-21 DIAGNOSIS — E2839 Other primary ovarian failure: Secondary | ICD-10-CM

## 2017-12-21 MED ORDER — SEMAGLUTIDE(0.25 OR 0.5MG/DOS) 2 MG/1.5ML ~~LOC~~ SOPN: 0.5000 mg | PEN_INJECTOR | SUBCUTANEOUS | 11 refills | Status: DC

## 2017-12-21 NOTE — Patient Instructions (Addendum)
Health Maintenance, Female Adopting a healthy lifestyle and getting preventive care can go a long way to promote health and wellness. Talk with your health care provider about what schedule of regular examinations is right for you. This is a good chance for you to check in with your provider about disease prevention and staying healthy. In between checkups, there are plenty of things you can do on your own. Experts have done a lot of research about which lifestyle changes and preventive measures are most likely to keep you healthy. Ask your health care provider for more information. Weight and diet Eat a healthy diet  Be sure to include plenty of vegetables, fruits, low-fat dairy products, and lean protein.  Do not eat a lot of foods high in solid fats, added sugars, or salt.  Get regular exercise. This is one of the most important things you can do for your health. ? Most adults should exercise for at least 150 minutes each week. The exercise should increase your heart rate and make you sweat (moderate-intensity exercise). ? Most adults should also do strengthening exercises at least twice a week. This is in addition to the moderate-intensity exercise.  Maintain a healthy weight  Body mass index (BMI) is a measurement that can be used to identify possible weight problems. It estimates body fat based on height and weight. Your health care provider can help determine your BMI and help you achieve or maintain a healthy weight.  For females 69 years of age and older: ? A BMI below 18.5 is considered underweight. ? A BMI of 18.5 to 24.9 is normal. ? A BMI of 25 to 29.9 is considered overweight. ? A BMI of 30 and above is considered obese.  Watch levels of cholesterol and blood lipids  You should start having your blood tested for lipids and cholesterol at 82 years of age, then have this test every 5 years.  You may need to have your cholesterol levels checked more often if: ? Your lipid or  cholesterol levels are high. ? You are older than 82 years of age. ? You are at high risk for heart disease.  Cancer screening Lung Cancer  Lung cancer screening is recommended for adults 70-27 years old who are at high risk for lung cancer because of a history of smoking.  A yearly low-dose CT scan of the lungs is recommended for people who: ? Currently smoke. ? Have quit within the past 15 years. ? Have at least a 30-pack-year history of smoking. A pack year is smoking an average of one pack of cigarettes a day for 1 year.  Yearly screening should continue until it has been 15 years since you quit.  Yearly screening should stop if you develop a health problem that would prevent you from having lung cancer treatment.  Breast Cancer  Practice breast self-awareness. This means understanding how your breasts normally appear and feel.  It also means doing regular breast self-exams. Let your health care provider know about any changes, no matter how small.  If you are in your 20s or 30s, you should have a clinical breast exam (CBE) by a health care provider every 1-3 years as part of a regular health exam.  If you are 68 or older, have a CBE every year. Also consider having a breast X-ray (mammogram) every year.  If you have a family history of breast cancer, talk to your health care provider about genetic screening.  If you are at high risk  for breast cancer, talk to your health care provider about having an MRI and a mammogram every year.  Breast cancer gene (BRCA) assessment is recommended for women who have family members with BRCA-related cancers. BRCA-related cancers include: ? Breast. ? Ovarian. ? Tubal. ? Peritoneal cancers.  Results of the assessment will determine the need for genetic counseling and BRCA1 and BRCA2 testing.  Cervical Cancer Your health care provider may recommend that you be screened regularly for cancer of the pelvic organs (ovaries, uterus, and  vagina). This screening involves a pelvic examination, including checking for microscopic changes to the surface of your cervix (Pap test). You may be encouraged to have this screening done every 3 years, beginning at age 22.  For women ages 56-65, health care providers may recommend pelvic exams and Pap testing every 3 years, or they may recommend the Pap and pelvic exam, combined with testing for human papilloma virus (HPV), every 5 years. Some types of HPV increase your risk of cervical cancer. Testing for HPV may also be done on women of any age with unclear Pap test results.  Other health care providers may not recommend any screening for nonpregnant women who are considered low risk for pelvic cancer and who do not have symptoms. Ask your health care provider if a screening pelvic exam is right for you.  If you have had past treatment for cervical cancer or a condition that could lead to cancer, you need Pap tests and screening for cancer for at least 20 years after your treatment. If Pap tests have been discontinued, your risk factors (such as having a new sexual partner) need to be reassessed to determine if screening should resume. Some women have medical problems that increase the chance of getting cervical cancer. In these cases, your health care provider may recommend more frequent screening and Pap tests.  Colorectal Cancer  This type of cancer can be detected and often prevented.  Routine colorectal cancer screening usually begins at 82 years of age and continues through 82 years of age.  Your health care provider may recommend screening at an earlier age if you have risk factors for colon cancer.  Your health care provider may also recommend using home test kits to check for hidden blood in the stool.  A small camera at the end of a tube can be used to examine your colon directly (sigmoidoscopy or colonoscopy). This is done to check for the earliest forms of colorectal  cancer.  Routine screening usually begins at age 33.  Direct examination of the colon should be repeated every 5-10 years through 82 years of age. However, you may need to be screened more often if early forms of precancerous polyps or small growths are found.  Skin Cancer  Check your skin from head to toe regularly.  Tell your health care provider about any new moles or changes in moles, especially if there is a change in a mole's shape or color.  Also tell your health care provider if you have a mole that is larger than the size of a pencil eraser.  Always use sunscreen. Apply sunscreen liberally and repeatedly throughout the day.  Protect yourself by wearing long sleeves, pants, a wide-brimmed hat, and sunglasses whenever you are outside.  Heart disease, diabetes, and high blood pressure  High blood pressure causes heart disease and increases the risk of stroke. High blood pressure is more likely to develop in: ? People who have blood pressure in the high end of  the normal range (130-139/85-89 mm Hg). ? People who are overweight or obese. ? People who are African American.  If you are 21-29 years of age, have your blood pressure checked every 3-5 years. If you are 3 years of age or older, have your blood pressure checked every year. You should have your blood pressure measured twice-once when you are at a hospital or clinic, and once when you are not at a hospital or clinic. Record the average of the two measurements. To check your blood pressure when you are not at a hospital or clinic, you can use: ? An automated blood pressure machine at a pharmacy. ? A home blood pressure monitor.  If you are between 17 years and 37 years old, ask your health care provider if you should take aspirin to prevent strokes.  Have regular diabetes screenings. This involves taking a blood sample to check your fasting blood sugar level. ? If you are at a normal weight and have a low risk for diabetes,  have this test once every three years after 83 years of age. ? If you are overweight and have a high risk for diabetes, consider being tested at a younger age or more often. Preventing infection Hepatitis B  If you have a higher risk for hepatitis B, you should be screened for this virus. You are considered at high risk for hepatitis B if: ? You were born in a country where hepatitis B is common. Ask your health care provider which countries are considered high risk. ? Your parents were born in a high-risk country, and you have not been immunized against hepatitis B (hepatitis B vaccine). ? You have HIV or AIDS. ? You use needles to inject street drugs. ? You live with someone who has hepatitis B. ? You have had sex with someone who has hepatitis B. ? You get hemodialysis treatment. ? You take certain medicines for conditions, including cancer, organ transplantation, and autoimmune conditions.  Hepatitis C  Blood testing is recommended for: ? Everyone born from [birth year] through 1963. ? Anyone with known risk factors for hepatitis C.  Sexually transmitted infections (STIs)  You should be screened for sexually transmitted infections (STIs) including gonorrhea and chlamydia if: ? You are sexually active and are younger than 82 years of age. ? You are older than 82 years of age and your health care provider tells you that you are at risk for this type of infection. ? Your sexual activity has changed since you were last screened and you are at an increased risk for chlamydia or gonorrhea. Ask your health care provider if you are at risk.  If you do not have HIV, but are at risk, it may be recommended that you take a prescription medicine daily to prevent HIV infection. This is called pre-exposure prophylaxis (PrEP). You are considered at risk if: ? You are sexually active and do not regularly use condoms or know the HIV status of your partner(s). ? You take drugs by injection. ? You are  sexually active with a partner who has HIV.  Talk with your health care provider about whether you are at high risk of being infected with HIV. If you choose to begin PrEP, you should first be tested for HIV. You should then be tested every 3 months for as long as you are taking PrEP. Pregnancy  If you are premenopausal and you may become pregnant, ask your health care provider about preconception counseling.  If you may become  pregnant, take 400 to 800 micrograms (mcg) of folic acid every day.  If you want to prevent pregnancy, talk to your health care provider about birth control (contraception). Osteoporosis and menopause  Osteoporosis is a disease in which the bones lose minerals and strength with aging. This can result in serious bone fractures. Your risk for osteoporosis can be identified using a bone density scan.  If you are 38 years of age or older, or if you are at risk for osteoporosis and fractures, ask your health care provider if you should be screened.  Ask your health care provider whether you should take a calcium or vitamin D supplement to lower your risk for osteoporosis.  Menopause may have certain physical symptoms and risks.  Hormone replacement therapy may reduce some of these symptoms and risks. Talk to your health care provider about whether hormone replacement therapy is right for you. Follow these instructions at home:  Schedule regular health, dental, and eye exams.  Stay current with your immunizations.  Do not use any tobacco products including cigarettes, chewing tobacco, or electronic cigarettes.  If you are pregnant, do not drink alcohol.  If you are breastfeeding, limit how much and how often you drink alcohol.  Limit alcohol intake to no more than 1 drink per day for nonpregnant women. One drink equals 12 ounces of beer, 5 ounces of wine, or 1 ounces of hard liquor.  Do not use street drugs.  Do not share needles.  Ask your health care  provider for help if you need support or information about quitting drugs.  Tell your health care provider if you often feel depressed.  Tell your health care provider if you have ever been abused or do not feel safe at home. This information is not intended to replace advice given to you by your health care provider. Make sure you discuss any questions you have with your health care provider. Document Released: 10/03/2010 Document Revised: 08/26/2015 Document Reviewed: 12/22/2014 Elsevier Interactive Patient Education  [employer]. It is important to avoid accidents which may result in broken bones.  Here are a few ideas on how to make your home safer so you will be less likely to trip or fall.  1. Use nonskid mats or non slip strips in your shower or tub, on your bathroom floor and around sinks.  If you know that you have spilled water, wipe it up! 2. In the bathroom, it is important to have properly installed grab bars on the walls or on the edge of the tub.  Towel racks are NOT strong enough for you to hold onto or to pull on for support. 3. Stairs and hallways should have enough light.  Add lamps or night lights if you need ore light. 4. It is good to have handrails on both sides of the stairs if possible.  Always fix broken handrails right away. 5. It is important to see the edges of steps.  Paint the edges of outdoor steps white so you can see them better.  Put colored tape on the edge of inside steps. 6. Throw-rugs are dangerous because they can slide.  Removing the rugs is the best idea, but if they must stay, add adhesive carpet tape to prevent slipping. 7. Do not keep things on stairs or in the halls.  Remove small furniture that blocks the halls as it may cause you to trip.  Keep telephone and electrical cords out of the way where you walk. 8. Always  were sturdy, rubber-soled shoes for good support.  Never wear just socks, especially on the stairs.  Socks may cause you to slip or  fall.  Do not wear full-length housecoats as you can easily trip on the bottom.  9. Place the things you use the most on the shelves that are the easiest to reach.  If you use a stepstool, make sure it is in good condition.  If you feel unsteady, DO NOT climb, ask for help. 10. If a health professional advises you to use a cane or walker, do not be ashamed.  These items can keep you from falling and breaking your bones.

## 2017-12-21 NOTE — Patient Outreach (Signed)
[facility]) Care Management  12/21/2017  Jessica [name] 1934/01/09 [id number]  84 year old female referred to [place] Management for medication assistance.  PMH significant for: T2DM, Afib, HTN, CHF, GERD, Hypothyroidism.  SUBJECTIVE: Successful outreach call to Jessica [name] with HIPAA identifiers verified.  Patient is a very alert and oriented 84 yo.  Patient agreeable to review medications telephonically.  She is very aware of her medications and voices concerns for finances. Patient is also informed of her DM and knows that here most recent A1C was higher at 8.9 on 11/23/17 compared with her A1c of 6.4 on 06/22/17.  She voices recent stressors of having to care for her sister who is now living with her. [facility] pharmacist counseled on BG checks, DM meds and diet.  OBJECTIVE: Medications Reviewed Today    Reviewed by Jessica [name], [facility] (Pharmacist) on 12/25/17 at 1354  Med List Status: <None>  Medication Order Taking? Sig Documenting Provider Last Dose Status Informant  acetaminophen (TYLENOL) 325 MG tablet [id number] No Take 2 tablets (650 mg total) by mouth every 6 (six) hours as needed for mild pain (or Fever >/= 101). Jessica Filler, MD Taking Active   allopurinol (ZYLOPRIM) 100 MG tablet [id number] No Take 100 mg by mouth daily. [provider] Taking Active   b complex vitamins tablet [id number] No Take 1 tablet by mouth daily. [provider] Taking Active Self  Blood Glucose Monitoring Suppl (TRUE METRIX AIR GLUCOSE METER) w/Device KIT [id number] No CHECK BLOOD SUGAR TWICE DAILY [name], Jessica Lacks, MD Taking Active   Cholecalciferol 1000 UNITS tablet [id number] No Take 1,000 Units by mouth daily.   [provider] Taking Active Self  ELIQUIS 5 MG TABS tablet [id number] No TAKE 1 TABLET TWICE DAILY [name], Jessica Lacks, MD Taking Active Self  furosemide (LASIX) 40 MG tablet [id number] No Take 1 tablet (40 mg total) by mouth 2 (two) times  daily. [name], Jessica Lacks, MD Taking Active   KLOR-CON M20 20 MEQ tablet [id number] No TAKE 1 TABLET BY MOUTH EVERY DAY Jessica Latch, MD Taking Active   levothyroxine (SYNTHROID, LEVOTHROID) 75 MCG tablet [id number] No TAKE 1 TABLET EVERY DAY [name], Jessica Lacks, MD Taking Active Self  LORazepam (ATIVAN) 0.5 MG tablet [id number]  Take 1 tablet (0.5 mg total) by mouth 2 (two) times daily. [name], Jessica Lacks, MD  Active   metFORMIN (GLUCOPHAGE-XR) 750 MG 24 hr tablet [id number] No Take 1 tablet (750 mg total) by mouth daily with breakfast. [name], Jessica Lacks, MD Taking Active Self  metoprolol succinate (TOPROL XL) 50 MG 24 hr tablet [id number] No Take 1 tablet (50 mg total) by mouth daily. Take with or immediately following a meal. [name], Jessica Lacks, MD Taking Active   Multiple Vitamins-Minerals (OCUVITE PRESERVISION) TABS [id number] No Take 1 tablet by mouth 2 (two) times daily. [provider] Taking Active Self  multivitamin-iron-minerals-folic acid (CENTRUM) chewable tablet [id number] No Chew 1 tablet by mouth daily. [provider] Taking Active   omeprazole (PRILOSEC) 40 MG capsule [id number] No TAKE 1 CAPSULE EVERY DAY [name], Jessica Lacks, MD Taking Active Self           Med Note Jessica [name], Lazaro Arms Dec 18, 2017  3:43 PM) Patient states that she takes as needed   sacubitril-valsartan (ENTRESTO) 24-26 MG [id number] No Take 1 tablet by mouth 2 (two) times daily. Jessica Latch, MD Taking Active   Semaglutide,0.25 or 0.5MG/DOS, (OZEMPIC, 0.25 OR 0.5 MG/DOSE,) 2 MG/1.5ML  SOPN [id number] No Inject 0.5 mg into the skin once a week. Start 0.25 mg weekly for 4 weeks, then go to 0.5 mg weekly sq [name], Jessica Lacks, MD Taking Active   temazepam (RESTORIL) 15 MG capsule [id number] No Take 1 capsule (15 mg total) by mouth at bedtime as needed for sleep. [name], Jessica Lacks, MD Taking Active   tetrahydrozoline 0.05 % ophthalmic solution [id number] No Place 1 drop into both eyes  daily. [provider] Taking Active Self  venlafaxine XR (EFFEXOR-XR) 75 MG 24 hr capsule [id number] No TAKE 1 CAPSULE EVERY DAY WITH BREAKFAST [name], Jessica Lacks, MD Taking Active         Drugs sorted by system:  Neurologic/Psychologic: lorazepam, temazepam, venlafaxine  Cardiovascular: Lasix, Entresto, Eliquis, metoprolol  Gastrointestinal: omeprazole  Endocrine: levothyroxine, Ozempic, metformin  Vitamins/Minerals: vit B, vit D, potasium, MVI  Miscellaneous: allopurionol  Medications to avoid in the elderly: temazepam: This drug is identified in the Beers Criteria as a potentially inappropriate medication to be avoided in patients 65 years and older (independent of diagnosis or condition) due to increased risk of impaired cognition, delirium, falls, fractures, and motor vehicle accidents with benzodiazepine use. Per Beers list, this medication should be avoided in elderly patients with dementia or cognitive impairment because of adverse CNS effects.   Assessment: Medication Assistance-->reviewed the following PAPs with patient.  She verbalizes understanding and meets the income requirements based on reported income: -Entresto-prescribed by Jessica [name] -Eliquis-prescribed by Jessica [name] -Ozempic -prescribed Jessica [name]   Plan: -I will route PAP letter to [place], Etter Sjogren who will assist with contacting provider & patient to obtain necessary application requirements -TROOP to be obtained with [employer] -I will followupwith patient in1-2 weeks to ensure PAP applications have arrived   Jessica [name], PharmD, [place]  [phone]

## 2017-12-24 ENCOUNTER — Telehealth: Payer: Self-pay | Admitting: *Deleted

## 2017-12-24 MED ORDER — LORAZEPAM 0.5 MG PO TABS: 0.5000 mg | ORAL_TABLET | Freq: Two times a day (BID) | ORAL | 1 refills | Status: DC

## 2017-12-24 NOTE — Telephone Encounter (Signed)
During AWV, patient stated she needed a prescription refill for lorezapam.

## 2017-12-24 NOTE — Telephone Encounter (Signed)
Ok Thx 

## 2017-12-25 ENCOUNTER — Other Ambulatory Visit: Payer: Self-pay | Admitting: Pharmacy Technician

## 2017-12-25 NOTE — Patient Outreach (Signed)
[facility]) Care Management  12/25/2017  TANARA TURVEY 02/15/1934 [id number]   Received [employer], [place] and [employer] patient assistance referral from [place] for [employer] and [employer]. Prepared patient portion to be mailed. Faxed provider portion to [name] for [facility] & Eliquis and Dr. Alain Marion for [place].  Will followup with patient in 7-10 business days to confirm application has been received.  Farheen Pfahler P. Ehtan Delfavero, [place] Management [phone]

## 2017-12-27 ENCOUNTER — Telehealth: Payer: Self-pay | Admitting: *Deleted

## 2017-12-27 ENCOUNTER — Ambulatory Visit (INDEPENDENT_AMBULATORY_CARE_PROVIDER_SITE_OTHER): Payer: Medicare HMO | Admitting: *Deleted

## 2017-12-27 ENCOUNTER — Other Ambulatory Visit: Payer: Self-pay | Admitting: Internal Medicine

## 2017-12-27 ENCOUNTER — Telehealth: Payer: Self-pay

## 2017-12-27 DIAGNOSIS — I442 Atrioventricular block, complete: Secondary | ICD-10-CM | POA: Diagnosis not present

## 2017-12-27 DIAGNOSIS — I441 Atrioventricular block, second degree: Secondary | ICD-10-CM

## 2017-12-27 NOTE — Telephone Encounter (Signed)
LMOVM reminding pt to send remote transmission.   

## 2017-12-27 NOTE — Telephone Encounter (Signed)
See [employer] scientist (medical) note 12/25/2017, have received provider forms for [name], he works Architectural technologist. I have placed in his mailbox for signature.   Assistance with ENTRESTO AND ELIQUIS.

## 2017-12-28 ENCOUNTER — Encounter: Payer: Self-pay | Admitting: Cardiology

## 2017-12-28 NOTE — Telephone Encounter (Signed)
**Note De-Identified  Obfuscation** [name] has signed both the [employer] Pt Asst application for Eliquis and the [employer] Pt Asst application for Entresto. I have faxed both back to [facility] ATTN: Etter Sjogren.

## 2017-12-28 NOTE — Progress Notes (Signed)
Remote pacemaker transmission.   

## 2018-01-02 ENCOUNTER — Other Ambulatory Visit: Payer: Self-pay | Admitting: Internal Medicine

## 2018-01-03 ENCOUNTER — Other Ambulatory Visit: Payer: Self-pay | Admitting: Pharmacy Technician

## 2018-01-03 NOTE — Patient Outreach (Signed)
[place] [facility]) Care Management  01/03/2018  AIDAH FORQUER Aug 26, 1933 [id number]    Unsuccessful call attempt to patient. HIPAA compliant voicemail left. Called to confirm if  patient assistance applications were received.  Will route note to [place] for followup.  Cyncere Ruhe P. Desira Alessandrini, [place] Management [phone]

## 2018-01-04 ENCOUNTER — Ambulatory Visit (INDEPENDENT_AMBULATORY_CARE_PROVIDER_SITE_OTHER)
Admission: RE | Admit: 2018-01-04 | Discharge: 2018-01-04 | Disposition: A | Discharge status: Home / Self Care | Payer: Medicare HMO | Source: Ambulatory Visit | Attending: Internal Medicine | Admitting: Internal Medicine

## 2018-01-04 DIAGNOSIS — E2839 Other primary ovarian failure: Secondary | ICD-10-CM

## 2018-01-08 ENCOUNTER — Other Ambulatory Visit: Payer: Self-pay | Admitting: Pharmacy Technician

## 2018-01-08 NOTE — Patient Outreach (Signed)
[place] [facility]) Care Management  01/08/2018  WRENLY LAURITSEN 1933-06-25 [id number]   Successful call to patient, HIPAA identifiers verified. Ms. Cude confirmed that she has received patient assistance applications however she has not filled them out. Patient states she has been received samples of Ozempic. I encouraged her to fill out applications as soon as she can so that we can submit them in case she is not able to get samples the next time she is due for medication. She said she would do so and mail them back as soon as possible.  Will follow up with patient in 10-14 business days if documents have not been received.  Maud Deed [place], [place] Management [phone]

## 2018-01-10 ENCOUNTER — Other Ambulatory Visit: Payer: Self-pay | Admitting: Internal Medicine

## 2018-01-10 MED ORDER — SEMAGLUTIDE(0.25 OR 0.5MG/DOS) 2 MG/1.5ML ~~LOC~~ SOPN: 0.5000 mg | PEN_INJECTOR | SUBCUTANEOUS | 11 refills | Status: DC

## 2018-01-16 ENCOUNTER — Other Ambulatory Visit: Payer: Self-pay | Admitting: Pharmacist

## 2018-01-22 ENCOUNTER — Ambulatory Visit: Payer: Self-pay | Admitting: Pharmacy Technician

## 2018-01-22 NOTE — Patient Outreach (Signed)
[place] [facility]) Care Management  [place]  01/22/2018  Jessica [name] 11-Oct-1933 [id number]   Successful outreach to patient with HIPAA identifiers verified.  Patient states she is still awaiting her TROOP from [place] and [place] with patient regarding TROOP.  She is agreeable to [facility] CPhT, Etter Sjogren calling her to assist in setting up [employer] portal to retrieve TROOP and proceed with PAPs.  Patient has 2 months worth of Ozempic Pens (samples)  PLAN: -I will route message for [facility] CPhT, Etter Sjogren to follow up with patient regarding TROOP   [name], PharmD, [place]  [phone]

## 2018-01-23 ENCOUNTER — Ambulatory Visit: Payer: Self-pay | Admitting: Pharmacy Technician

## 2018-01-24 ENCOUNTER — Ambulatory Visit: Payer: Self-pay | Admitting: Pharmacy Technician

## 2018-01-25 ENCOUNTER — Other Ambulatory Visit: Payer: Self-pay | Admitting: Pharmacy Technician

## 2018-01-25 NOTE — Patient Outreach (Signed)
[facility]) Care Management  01/25/2018  Jessica [name] May 25, 1933 [id number]   Successful call to patient, HIPAA identifiers verified. SHe states that she had filled out applications and she is going out today to get copy of her income. She states that she has received a lot of envelopes in the mail from [facility] and is not sure of what she would need to send. Per Lottie Dawson on 10/16 patient has given authorization to set up patient portal with [facility]. I mentioned that and she stated "to please set it up because I have no idea which documents here that you need. She states that she will try to have applications in the mail by Monday.  Will follow up with patient in 5-7 business days if documents have not been received.  Maud Deed Beola Cord Certified Pharmacy Technician [place] [facility] Care Management [phone]

## 2018-02-04 ENCOUNTER — Other Ambulatory Visit: Payer: Self-pay | Admitting: Internal Medicine

## 2018-02-05 ENCOUNTER — Other Ambulatory Visit: Payer: Self-pay | Admitting: Cardiovascular Disease

## 2018-02-05 LAB — CUP PACEART REMOTE DEVICE CHECK
Battery Voltage: 2.79 V
Date Time Interrogation Session: 20190926193105
Implantable Lead Implant Date: 20140710
Implantable Lead Location: 753859
Implantable Pulse Generator Implant Date: 20140710
Lead Channel Impedance Value: 67 Ohm
Lead Channel Pacing Threshold Pulse Width: 0.4 ms
Lead Channel Setting Pacing Amplitude: 2.5 V
MDC IDC LEAD IMPLANT DT: 20140710
MDC IDC LEAD LOCATION: 753860
MDC IDC MSMT BATTERY IMPEDANCE: 329 Ohm
MDC IDC MSMT BATTERY REMAINING LONGEVITY: 113 mo
MDC IDC MSMT LEADCHNL RV IMPEDANCE VALUE: 728 Ohm
MDC IDC MSMT LEADCHNL RV PACING THRESHOLD AMPLITUDE: 0.375 V
MDC IDC SET LEADCHNL RV PACING PULSEWIDTH: 0.4 ms
MDC IDC SET LEADCHNL RV SENSING SENSITIVITY: 4 mV
MDC IDC STAT BRADY RV PERCENT PACED: 98 %

## 2018-02-13 ENCOUNTER — Other Ambulatory Visit: Payer: Self-pay | Admitting: Internal Medicine

## 2018-02-13 NOTE — Telephone Encounter (Signed)
Not on current med list. Please advise 

## 2018-02-16 ENCOUNTER — Other Ambulatory Visit: Payer: Self-pay | Admitting: Cardiovascular Disease

## 2018-02-18 ENCOUNTER — Other Ambulatory Visit: Payer: Self-pay

## 2018-02-18 NOTE — Patient Outreach (Signed)
//  Subjective: [facility]) Care Management  02/18/2018   Jessica [name] 1934-01-27 [id number]  Subjective: Successful outreach to the patient for assessment.  HIPAA verified. The patient states that she is doing fine.  She denies any chest pain, wheezing, swelling or falls.  She does state that she has shortness of breath every once in a while and it does not last long.  I discussed with the patient about the CHF action plan and she verbalized understanding.  She states that she tries to limit her activities so she will not over exert herself. The patient is watching her diet and taking her medications as prescribed. The patient states that she received her flu shot in September of this year.  The patient has an appointment with Dr Alain Marion 02/26/2018 and pacemaker check on 03/11/2018.    Current Medications:  Current Outpatient Medications  Medication Sig Dispense Refill  . acetaminophen (TYLENOL) 325 MG tablet Take 2 tablets (650 mg total) by mouth every 6 (six) hours as needed for mild pain (or Fever >/= 101).    [name] allopurinol (ZYLOPRIM) 100 MG tablet TAKE 1 TABLET EVERY DAY 90 tablet 3  . b complex vitamins tablet Take 1 tablet by mouth daily.    . Blood Glucose Monitoring Suppl (TRUE METRIX AIR GLUCOSE METER) w/Device KIT CHECK BLOOD SUGAR TWICE DAILY 1 kit 0  . Cholecalciferol 1000 UNITS tablet Take 1,000 Units by mouth daily.      [name] ELIQUIS 5 MG TABS tablet TAKE 1 TABLET TWICE DAILY 180 tablet 3  . furosemide (LASIX) 40 MG tablet Take 1 tablet (40 mg total) by mouth 2 (two) times daily. 180 tablet 1  . KLOR-CON M20 20 MEQ tablet TAKE 1 TABLET BY MOUTH EVERY DAY 90 tablet 1  . levothyroxine (SYNTHROID, LEVOTHROID) 75 MCG tablet TAKE 1 TABLET EVERY DAY 90 tablet 3  . LORazepam (ATIVAN) 0.5 MG tablet Take 1 tablet (0.5 mg total) by mouth 2 (two) times daily. 180 tablet 1  . metFORMIN (GLUCOPHAGE-XR) 750 MG 24 hr tablet TAKE 1 TABLET (750 MG TOTAL) BY MOUTH DAILY  WITH BREAKFAST. 90 tablet 3  . Multiple Vitamins-Minerals (OCUVITE PRESERVISION) TABS Take 1 tablet by mouth 2 (two) times daily.    . multivitamin-iron-minerals-folic acid (CENTRUM) chewable tablet Chew 1 tablet by mouth daily.    [name] omeprazole (PRILOSEC) 40 MG capsule TAKE 1 CAPSULE EVERY DAY 90 capsule 3  . sacubitril-valsartan (ENTRESTO) 24-26 MG Take 1 tablet by mouth 2 (two) times daily. 60 tablet 5  . Semaglutide,0.25 or 0.5MG/DOS, (OZEMPIC, 0.25 OR 0.5 MG/DOSE,) 2 MG/1.5ML SOPN Inject 0.5 mg into the skin once a week. Start 0.25 mg weekly for 4 weeks, then go to 0.5 mg weekly sq 1 pen 11  . temazepam (RESTORIL) 15 MG capsule Take 1 capsule (15 mg total) by mouth at bedtime as needed for sleep. 90 capsule 1  . tetrahydrozoline 0.05 % ophthalmic solution Place 1 drop into both eyes daily.    . TRUE METRIX BLOOD GLUCOSE TEST test strip CHECK BLOOD SUGAR TWICE DAILY 200 each 1  . TRUEPLUS LANCETS 33G MISC CHECK BLOOD SUGARS TWICE A DAY 200 each 1  . venlafaxine XR (EFFEXOR-XR) 75 MG 24 hr capsule TAKE 1 CAPSULE EVERY DAY WITH BREAKFAST 90 capsule 2  . metoprolol succinate (TOPROL XL) 50 MG 24 hr tablet Take 1 tablet (50 mg total) by mouth daily. Take with or immediately following a meal. 90 tablet 1  . metoprolol succinate (TOPROL-XL)  50 MG 24 hr tablet TAKE 1 TABLET (50 MG TOTAL) BY MOUTH DAILY. TAKE WITH OR IMMEDIATELY FOLLOWING A MEAL. 90 tablet 1  . temazepam (RESTORIL) 15 MG capsule TAKE 1 CAPSULE (15 MG TOTAL) BY MOUTH AT BEDTIME AS NEEDED FOR SLEEP. 30 capsule 5   No current facility-administered medications for this visit.     Functional Status:  In your present state of health, do you have any difficulty performing the following activities: 12/21/2017 07/12/2017  Hearing? N N  Vision? N N  Difficulty concentrating or making decisions? N N  Walking or climbing stairs? N Y  Dressing or bathing? N N  Doing errands, shopping? N N  Preparing Food and eating ? N -  Using the Toilet? N  -  In the past six months, have you accidently leaked urine? N -  Do you have problems with loss of bowel control? N -  Managing your Medications? N -  Managing your Finances? N -  Housekeeping or managing your Housekeeping? N -  Some recent data might be hidden    Fall/Depression Screening: Fall Risk  02/18/2018 12/21/2017 12/18/2017  Falls in the past year? 0 No No  Number falls in past yr: - - -  Injury with Fall? - - -  Risk for fall due to : - - -  Follow up - - -   PHQ 2/9 Scores 12/21/2017 07/12/2017 12/20/2016 12/08/2016 07/10/2016 10/27/2015 10/19/2014  PHQ - 2 Score '1 2 2 ' 0 - 0 0  PHQ- 9 Score '2 3 3 ' - - - -  Exception Documentation - - - - Medical reason - -    Assessment: Patient will continue to benefit from health coach outreach for disease management and support.  THN CM Care Plan Problem One     Most Recent Value  THN Long Term Goal   In 30 days the patient will verbalize not having an admission in the [place] Term Goal Start Date  02/18/18  Interventions for Problem One Long Term Goal  Discussed signs and symptoms of chf, chf action plan and medications      Plan: RN Health Coach will contact patient in the month of December and patient agrees to next outreach.  Lazaro Arms RN, BSN, [place] Direct Dial:  [phone]  Fax: [phone]

## 2018-02-19 MED ORDER — DAPAGLIFLOZIN PROPANEDIOL 5 MG PO TABS: 5.0000 mg | ORAL_TABLET | Freq: Every day | ORAL | 11 refills | Status: DC

## 2018-02-22 ENCOUNTER — Ambulatory Visit: Payer: Self-pay

## 2018-02-22 NOTE — Telephone Encounter (Signed)
Patient called to question why she's on Farxiga. She says that she didn't know she was prescribed this until she went to the pharmacy to pick up the entresto the was sent in by the cardiologist. She says she had to pay out of pocket $129.38 and that is too expensive for her to purchase every month. She says her blood sugar at lunch today was 157 and she doesn't think she needs to be on all of this medication, because she can't afford it. I advised I will send her concern to Dr. Alain Marion and someone from the office will call back with his recommendation. She verbalized understanding and says she will not take it until she hears back.   Reason for Disposition . Caller has NON-URGENT medication question about med that PCP prescribed and triager unable to answer question  Protocols used: MEDICATION QUESTION CALL-A-AH

## 2018-02-23 NOTE — Telephone Encounter (Signed)
Please advise about Jessica [name]

## 2018-02-25 NOTE — Telephone Encounter (Signed)
What was $129: Delene Loll or Wilder Glade? Thx

## 2018-02-26 ENCOUNTER — Encounter: Payer: Self-pay | Admitting: Internal Medicine

## 2018-02-26 ENCOUNTER — Other Ambulatory Visit (INDEPENDENT_AMBULATORY_CARE_PROVIDER_SITE_OTHER): Payer: Medicare HMO

## 2018-02-26 ENCOUNTER — Ambulatory Visit (INDEPENDENT_AMBULATORY_CARE_PROVIDER_SITE_OTHER): Payer: Medicare HMO | Admitting: Internal Medicine

## 2018-02-26 DIAGNOSIS — E039 Hypothyroidism, unspecified: Secondary | ICD-10-CM | POA: Diagnosis not present

## 2018-02-26 DIAGNOSIS — E118 Type 2 diabetes mellitus with unspecified complications: Secondary | ICD-10-CM

## 2018-02-26 DIAGNOSIS — I480 Paroxysmal atrial fibrillation: Secondary | ICD-10-CM | POA: Diagnosis not present

## 2018-02-26 DIAGNOSIS — I5043 Acute on chronic combined systolic (congestive) and diastolic (congestive) heart failure: Secondary | ICD-10-CM | POA: Diagnosis not present

## 2018-02-26 DIAGNOSIS — R600 Localized edema: Secondary | ICD-10-CM | POA: Diagnosis not present

## 2018-02-26 LAB — BASIC METABOLIC PANEL
BUN: 22 mg/dL (ref 6–23)
CO2: 28 mEq/L (ref 19–32)
Calcium: 10.5 mg/dL (ref 8.4–10.5)
Chloride: 98 mEq/L (ref 96–112)
Creatinine, Ser: 0.88 mg/dL (ref 0.40–1.20)
GFR: 65.31 mL/min (ref >60.00)
Glucose, Bld: 122 mg/dL — ABNORMAL HIGH (ref 70–99)
POTASSIUM: 3.8 meq/L (ref 3.5–5.1)
SODIUM: 137 meq/L (ref 135–145)

## 2018-02-26 LAB — HEMOGLOBIN A1C: HEMOGLOBIN A1C: 7.3 % — AB (ref 4.6–6.5)

## 2018-02-26 NOTE — Assessment & Plan Note (Signed)
On Levothroid 

## 2018-02-26 NOTE — Assessment & Plan Note (Signed)
On Jessica [name] - doing well May need to go up on the dose

## 2018-02-26 NOTE — Assessment & Plan Note (Signed)
Better  

## 2018-02-26 NOTE — Progress Notes (Signed)
Subjective:  Patient ID: Jessica [name], female    DOB: 08/25/1933  Age: 84 y.o. MRN: [id number]  CC: No chief complaint on file.   HPI Jessica [name] presents for DM, HTN, gout, CHF f/u  Outpatient Medications Prior to Visit  Medication Sig Dispense Refill  . acetaminophen (TYLENOL) 325 MG tablet Take 2 tablets (650 mg total) by mouth every 6 (six) hours as needed for mild pain (or Fever >/= 101).    [name] allopurinol (ZYLOPRIM) 100 MG tablet TAKE 1 TABLET EVERY DAY 90 tablet 3  . b complex vitamins tablet Take 1 tablet by mouth daily.    . Blood Glucose Monitoring Suppl (TRUE METRIX AIR GLUCOSE METER) w/Device KIT CHECK BLOOD SUGAR TWICE DAILY 1 kit 0  . Cholecalciferol 1000 UNITS tablet Take 1,000 Units by mouth daily.      [name] ELIQUIS 5 MG TABS tablet TAKE 1 TABLET TWICE DAILY 180 tablet 3  . ENTRESTO 24-26 MG TAKE 1 TABLET BY MOUTH TWICE A DAY 60 tablet 5  . furosemide (LASIX) 40 MG tablet Take 1 tablet (40 mg total) by mouth 2 (two) times daily. 180 tablet 1  . KLOR-CON M20 20 MEQ tablet TAKE 1 TABLET BY MOUTH EVERY DAY 90 tablet 1  . levothyroxine (SYNTHROID, LEVOTHROID) 75 MCG tablet TAKE 1 TABLET EVERY DAY 90 tablet 3  . LORazepam (ATIVAN) 0.5 MG tablet Take 1 tablet (0.5 mg total) by mouth 2 (two) times daily. 180 tablet 1  . losartan (COZAAR) 100 MG tablet TAKE 1 TABLET EVERY DAY 90 tablet 3  . metFORMIN (GLUCOPHAGE-XR) 750 MG 24 hr tablet TAKE 1 TABLET (750 MG TOTAL) BY MOUTH DAILY WITH BREAKFAST. 90 tablet 3  . metoprolol succinate (TOPROL XL) 50 MG 24 hr tablet Take 1 tablet (50 mg total) by mouth daily. Take with or immediately following a meal. 90 tablet 1  . metoprolol succinate (TOPROL-XL) 50 MG 24 hr tablet TAKE 1 TABLET (50 MG TOTAL) BY MOUTH DAILY. TAKE WITH OR IMMEDIATELY FOLLOWING A MEAL. 90 tablet 1  . Multiple Vitamins-Minerals (OCUVITE PRESERVISION) TABS Take 1 tablet by mouth 2 (two) times daily.    . multivitamin-iron-minerals-folic acid (CENTRUM) chewable  tablet Chew 1 tablet by mouth daily.    [name] omeprazole (PRILOSEC) 40 MG capsule TAKE 1 CAPSULE EVERY DAY 90 capsule 3  . Semaglutide,0.25 or 0.5MG/DOS, (OZEMPIC, 0.25 OR 0.5 MG/DOSE,) 2 MG/1.5ML SOPN Inject 0.5 mg into the skin once a week. Start 0.25 mg weekly for 4 weeks, then go to 0.5 mg weekly sq 1 pen 11  . temazepam (RESTORIL) 15 MG capsule Take 1 capsule (15 mg total) by mouth at bedtime as needed for sleep. 90 capsule 1  . temazepam (RESTORIL) 15 MG capsule TAKE 1 CAPSULE (15 MG TOTAL) BY MOUTH AT BEDTIME AS NEEDED FOR SLEEP. 30 capsule 5  . tetrahydrozoline 0.05 % ophthalmic solution Place 1 drop into both eyes daily.    . TRUE METRIX BLOOD GLUCOSE TEST test strip CHECK BLOOD SUGAR TWICE DAILY 200 each 1  . TRUEPLUS LANCETS 33G MISC CHECK BLOOD SUGARS TWICE A DAY 200 each 1  . venlafaxine XR (EFFEXOR-XR) 75 MG 24 hr capsule TAKE 1 CAPSULE EVERY DAY WITH BREAKFAST 90 capsule 2  . dapagliflozin propanediol (FARXIGA) 5 MG TABS tablet Take 5 mg by mouth daily. (Patient not taking: Reported on 02/26/2018) 30 tablet 11   No facility-administered medications prior to visit.     ROS: Review of Systems  Constitutional: Negative for  activity change, appetite change, chills, fatigue and unexpected weight change.  HENT: Negative for congestion, mouth sores and sinus pressure.   Eyes: Negative for visual disturbance.  Respiratory: Negative for cough and chest tightness.   Gastrointestinal: Negative for abdominal pain and nausea.  Genitourinary: Negative for difficulty urinating, frequency and vaginal pain.  Musculoskeletal: Positive for arthralgias and gait problem. Negative for back pain.  Skin: Negative for pallor and rash.  Neurological: Negative for dizziness, tremors, weakness, numbness and headaches.  Psychiatric/Behavioral: Negative for confusion, sleep disturbance and suicidal ideas.    Objective:  BP 126/80 (BP Location: Left Arm, Patient Position: Sitting, Cuff Size: Normal)    Pulse 87   Temp 97.6 F (36.4 C) (Oral)   Ht '5\' 2"'  (1.575 m)   Wt 245 lb (111.1 kg)   SpO2 97%   BMI 44.81 kg/m   BP Readings from Last 3 Encounters:  02/26/18 126/80  12/21/17 124/74  12/13/17 130/70    Wt Readings from Last 3 Encounters:  02/26/18 245 lb (111.1 kg)  12/21/17 248 lb (112.5 kg)  12/13/17 249 lb 3.2 oz (113 kg)    Physical Exam  Constitutional: She appears well-developed. No distress.  HENT:  Head: Normocephalic.  Right Ear: External ear normal.  Left Ear: External ear normal.  Nose: Nose normal.  Mouth/Throat: Oropharynx is clear and moist.  Eyes: Pupils are equal, round, and reactive to light. Conjunctivae are normal. Right eye exhibits no discharge. Left eye exhibits no discharge.  Neck: Normal range of motion. Neck supple. No JVD present. No tracheal deviation present. No thyromegaly present.  Cardiovascular: Normal rate, regular rhythm and normal heart sounds.  Pulmonary/Chest: No stridor. No respiratory distress. She has no wheezes.  Abdominal: Soft. Bowel sounds are normal. She exhibits no distension and no mass. There is no tenderness. There is no rebound and no guarding.  Musculoskeletal: She exhibits tenderness. She exhibits no edema.  Lymphadenopathy:    She has no cervical adenopathy.  Neurological: She displays normal reflexes. No cranial nerve deficit. She exhibits normal muscle tone. Coordination abnormal.  Skin: No rash noted. No erythema.  Psychiatric: She has a normal mood and affect. Her behavior is normal. Judgment and thought content normal.   Obese   Lab Results  Component Value Date   WBC 7.5 06/26/2017   HGB 13.2 06/26/2017   HCT 40.2 06/26/2017   PLT 360 06/26/2017   GLUCOSE 225 (H) 11/23/2017   CHOL 191 02/19/2017   TRIG 183.0 (H) 02/19/2017   HDL 38.90 (L) 02/19/2017   LDLDIRECT 155.9 09/30/2012   LDLCALC 116 (H) 02/19/2017   ALT 15 06/22/2017   AST 24 06/22/2017   NA 136 11/23/2017   K 4.1 11/23/2017   CL 98  11/23/2017   CREATININE 0.98 11/23/2017   BUN 26 (H) 11/23/2017   CO2 29 11/23/2017   TSH 2.771 06/22/2017   INR 1.68 06/23/2017   HGBA1C 8.9 (H) 11/23/2017   MICROALBUR 4.5 (H) 12/02/2009    Dexascan  Result Date: 01/06/2018 Date of study: 01/04/18 Exam: DUAL X-RAY ABSORPTIOMETRY (DXA) FOR BONE MINERAL DENSITY (BMD) Instrument: [employer] Provider: PCP Indication: screening for osteoporosis Comparison: none (please note that it is not possible to compare data from different instruments) Clinical data: Pt is a 84 y.o. female with previous fractures. Results:  Lumbar spine L1-L4 Femoral neck (FN) 33% distal radius T-score 4.9 RFN: 2.5 LFN: 1.4 n/a Change in BMD from previous DXA test (%) n/a n/a n/a (*) statistically significant  Assessment: the BMD is normal according to the [facility] classification for osteoporosis (see below). Fracture risk: low FRAX score: not calculated due to normal BMD Comments: the technical quality of the study is good  Degenerative change may falsely elevate spine score. Recommend optimizing calcium (1200 mg/day) and vitamin D (800 IU/day) intake. No pharmacological treatment is indicated. Followup: Repeat BMD is appropriate after 2 years. WHO criteria for diagnosis of osteoporosis in postmenopausal women and in men 37 y/o or older: - normal: T-score -1.0 to + 1.0 - osteopenia/low bone density: T-score between -2.5 and -1.0 - osteoporosis: T-score below -2.5 - severe osteoporosis: T-score below -2.5 with history of fragility fracture Note: although not part of the WHO classification, the presence of a fragility fracture, regardless of the T-score, should be considered diagnostic of osteoporosis, provided other causes for the fracture have been excluded. Loura Pardon MD    Assessment & Plan:   There are no diagnoses linked to this encounter.   No orders of the defined types were placed in this encounter.    Follow-up: No follow-ups on file.  Walker Kehr, MD

## 2018-02-26 NOTE — Assessment & Plan Note (Signed)
Ozempic, [employer]

## 2018-02-26 NOTE — Telephone Encounter (Signed)
Pt seen today

## 2018-02-26 NOTE — Assessment & Plan Note (Signed)
Wt Readings from Last 3 Encounters:  02/26/18 245 lb (111.1 kg)  12/21/17 248 lb (112.5 kg)  12/13/17 249 lb 3.2 oz (113 kg)

## 2018-02-26 NOTE — Assessment & Plan Note (Signed)
Doing well 

## 2018-03-06 ENCOUNTER — Other Ambulatory Visit: Payer: Self-pay | Admitting: Pharmacist

## 2018-03-06 ENCOUNTER — Telehealth: Payer: Self-pay | Admitting: Cardiovascular Disease

## 2018-03-06 NOTE — Telephone Encounter (Signed)
° ° °  Patient calling the office for samples of medication: ° ° °1.  What medication and dosage are you requesting samples for? °ELIQUIS 5 MG TABS tablet ° °2.  Are you currently out of this medication? yes ° ° °

## 2018-03-06 NOTE — Patient Outreach (Signed)
[facility]) Care Management  03/06/2018  Jessica [name] 10/14/1933 [id number]  Incoming call from Jessica [name] with HIPAA identifiers verified x2.  Patient calling to see if her PAP packet has arrived at [facility].  Patient states she mailed envelop back to [facility] as directed.  She is very upset that her personal information has been lost in the mail at this time.  Unfortunately, packet never arrived at [facility].  Patient will not be receiving patient assistance for this calendar year.  Patient states that she has enough DM medications since her endocrinologist just gave her samples.  She is running out of her Eliquis and cannot afford to refill this medication  Successful care coordination call to Dr. Jackalyn Lombard office ([place]) to obtain patient samples.  RN to call patient to arrange pick up.  Called patient to update her on status.  She is thankful and verbalizes understanding.  Patient has been provided [facility] CM contact information if assistance needed in the future.  Thank you for allowing [facility] pharmacy to be involved in this patient's care.     PLAN: [facility] Pharmacist to sign off of case as patient will have samples to bridge her to the next calendar year.  -Patient's samples have been retrieved   [name], PharmD, [place]  [phone]

## 2018-03-06 NOTE — Telephone Encounter (Signed)
Medication samples have been provided to the patient.  Drug name: eliquis 5mg   Qty: 2 boxes  LOT: [id number]  Exp.Date: 02/2020  Samples left at front desk for patient pick-up. Patient notified.  Sheral Apley M 4:39 PM 03/06/2018

## 2018-03-11 ENCOUNTER — Encounter: Payer: Self-pay | Admitting: Internal Medicine

## 2018-03-11 ENCOUNTER — Ambulatory Visit: Payer: Medicare HMO | Admitting: Internal Medicine

## 2018-03-11 VITALS — BP 112/78 | HR 85 | Ht 62.0 in | Wt 246.2 lb

## 2018-03-11 DIAGNOSIS — Z950 Presence of cardiac pacemaker: Secondary | ICD-10-CM | POA: Diagnosis not present

## 2018-03-11 DIAGNOSIS — I442 Atrioventricular block, complete: Secondary | ICD-10-CM

## 2018-03-11 DIAGNOSIS — I4821 Permanent atrial fibrillation: Secondary | ICD-10-CM
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
## 2018-03-11 NOTE — Patient Instructions (Signed)
Medication Instructions:  Your physician recommends that you continue on your current medications as directed. Please refer to the Current Medication list given to you today.  Labwork: None ordered.  Testing/Procedures: None ordered.  Follow-Up: Your physician recommends that you schedule a follow-up appointment in:   One Year with Chanetta Marshall, NP  Remote monitoring is used to monitor your Pacemaker of ICD from home. This monitoring reduces the number of office visits required to check your device to one time per year. It allows [place] to keep an eye on the functioning of your device to ensure it is working properly. You are scheduled for a device check from home on 12/26. You may send your transmission at any time that day. If you have a wireless device, the transmission will be sent automatically. After your physician reviews your transmission, you will receive a postcard with your next transmission date.    Any Other Special Instructions Will Be Listed Below (If Applicable).     If you need a refill on your cardiac medications before your next appointment, please call your pharmacy.

## 2018-03-20 ENCOUNTER — Other Ambulatory Visit: Payer: Self-pay

## 2018-03-20 NOTE — Patient Outreach (Signed)
[facility] [facility]) Care Management  03/20/2018   Jessica [name] 09-16-1933 [id number]  Subjective: Successful outreach to the patient for assessment.  HIPAA verified.  The patient states that she is doing well. She denies any chest pain shortness of breath, swelling or falls. She states that her sister no  longer lives with her.  She moved out after thanksgiving.The patient states that she is taking her medications as prescribed.  She was very pleased with the [place] department because they were able to get her samples of the medications she needed to last through her donut hole.  She states that her blood sugars have been doing better.  On her last check 02/26/18 her a1c came down from 8.9 to 7.3.  She was very happy with her results.  I congratulated the patient on her efforts.  The patient states that she just had an appointment with [name] on the 9th of December and has an appointment with Dr Alain Marion in March of 2020.  Current Medications:  Current Outpatient Medications  Medication Sig Dispense Refill  . acetaminophen (TYLENOL) 325 MG tablet Take 2 tablets (650 mg total) by mouth every 6 (six) hours as needed for mild pain (or Fever >/= 101).    [name] allopurinol (ZYLOPRIM) 100 MG tablet TAKE 1 TABLET EVERY DAY 90 tablet 3  . b complex vitamins tablet Take 1 tablet by mouth daily.    . Blood Glucose Monitoring Suppl (TRUE METRIX AIR GLUCOSE METER) w/Device KIT CHECK BLOOD SUGAR TWICE DAILY 1 kit 0  . Cholecalciferol 1000 UNITS tablet Take 1,000 Units by mouth daily.      . dapagliflozin propanediol (FARXIGA) 5 MG TABS tablet Take 5 mg by mouth daily. 30 tablet 11  . ELIQUIS 5 MG TABS tablet TAKE 1 TABLET TWICE DAILY 180 tablet 3  . ENTRESTO 24-26 MG TAKE 1 TABLET BY MOUTH TWICE A DAY 60 tablet 5  . furosemide (LASIX) 40 MG tablet Take 1 tablet (40 mg total) by mouth 2 (two) times daily. 180 tablet 1  . KLOR-CON M20 20 MEQ tablet TAKE 1 TABLET BY MOUTH EVERY DAY 90  tablet 1  . levothyroxine (SYNTHROID, LEVOTHROID) 75 MCG tablet TAKE 1 TABLET EVERY DAY 90 tablet 3  . LORazepam (ATIVAN) 0.5 MG tablet Take 1 tablet (0.5 mg total) by mouth 2 (two) times daily. 180 tablet 1  . losartan (COZAAR) 100 MG tablet TAKE 1 TABLET EVERY DAY 90 tablet 3  . metFORMIN (GLUCOPHAGE-XR) 750 MG 24 hr tablet TAKE 1 TABLET (750 MG TOTAL) BY MOUTH DAILY WITH BREAKFAST. 90 tablet 3  . metoprolol succinate (TOPROL-XL) 50 MG 24 hr tablet TAKE 1 TABLET (50 MG TOTAL) BY MOUTH DAILY. TAKE WITH OR IMMEDIATELY FOLLOWING A MEAL. 90 tablet 1  . Multiple Vitamins-Minerals (OCUVITE PRESERVISION) TABS Take 1 tablet by mouth 2 (two) times daily.    . multivitamin-iron-minerals-folic acid (CENTRUM) chewable tablet Chew 1 tablet by mouth daily.    [name] omeprazole (PRILOSEC) 40 MG capsule TAKE 1 CAPSULE EVERY DAY 90 capsule 3  . Semaglutide,0.25 or 0.5MG/DOS, (OZEMPIC, 0.25 OR 0.5 MG/DOSE,) 2 MG/1.5ML SOPN Inject 0.5 mg into the skin once a week. Start 0.25 mg weekly for 4 weeks, then go to 0.5 mg weekly sq 1 pen 11  . temazepam (RESTORIL) 15 MG capsule Take 1 capsule (15 mg total) by mouth at bedtime as needed for sleep. 90 capsule 1  . tetrahydrozoline 0.05 % ophthalmic solution Place 1 drop into both eyes daily.    [name]  TRUE METRIX BLOOD GLUCOSE TEST test strip CHECK BLOOD SUGAR TWICE DAILY 200 each 1  . TRUEPLUS LANCETS 33G MISC CHECK BLOOD SUGARS TWICE A DAY 200 each 1  . venlafaxine XR (EFFEXOR-XR) 75 MG 24 hr capsule TAKE 1 CAPSULE EVERY DAY WITH BREAKFAST 90 capsule 2  . metoprolol succinate (TOPROL XL) 50 MG 24 hr tablet Take 1 tablet (50 mg total) by mouth daily. Take with or immediately following a meal. 90 tablet 1  . temazepam (RESTORIL) 15 MG capsule TAKE 1 CAPSULE (15 MG TOTAL) BY MOUTH AT BEDTIME AS NEEDED FOR SLEEP. (Patient not taking: Reported on 03/20/2018) 30 capsule 5   No current facility-administered medications for this visit.     Functional Status:  In your present state of  health, do you have any difficulty performing the following activities: 12/21/2017 07/12/2017  Hearing? N N  Vision? N N  Difficulty concentrating or making decisions? N N  Walking or climbing stairs? N Y  Dressing or bathing? N N  Doing errands, shopping? N N  Preparing Food and eating ? N -  Using the Toilet? N -  In the past six months, have you accidently leaked urine? N -  Do you have problems with loss of bowel control? N -  Managing your Medications? N -  Managing your Finances? N -  Housekeeping or managing your Housekeeping? N -  Some recent data might be hidden    Fall/Depression Screening: Fall Risk  03/20/2018 02/18/2018 12/21/2017  Falls in the past year? 0 0 No  Number falls in past yr: - - -  Injury with Fall? - - -  Risk for fall due to : - - -  Follow up - - -   PHQ 2/9 Scores 12/21/2017 07/12/2017 12/20/2016 12/08/2016 07/10/2016 10/27/2015 10/19/2014  PHQ - 2 Score _0 0 - 0 0  PHQ- 9 Score _1 - - - -  Exception Documentation - - - - Medical reason - -    Assessment: Patient will continue to benefit from health coach outreach for disease management and support.  THN CM Care Plan Problem One     Most Recent Value  THN Long Term Goal   In 60 days the patient will verbalize not having an admission in the [place] Term Goal Start Date  03/20/18  Interventions for Problem One Long Term Goal  reviewed the copd actin plan, signs and symptoms andmedication adherence.       Plan:  RN Health Coach will contact patient in the month of February and patient agrees to next outreach.   Lazaro Arms RN, BSN, [place] Direct Dial:  [phone]  Fax: [phone]

## 2018-03-22 LAB — CUP PACEART INCLINIC DEVICE CHECK
Implantable Lead Implant Date: 20140710
Implantable Lead Location: 753860
Implantable Lead Model: 5092
Implantable Lead Model: 5592
Implantable Pulse Generator Implant Date: 20140710
MDC IDC LEAD IMPLANT DT: 20140710
MDC IDC LEAD LOCATION: 753859
MDC IDC SESS DTM: 20191220142003

## 2018-03-29 ENCOUNTER — Telehealth: Payer: Self-pay

## 2018-03-29 ENCOUNTER — Encounter: Payer: Self-pay | Admitting: Cardiology

## 2018-03-29 NOTE — Telephone Encounter (Signed)
Called pt to remind of missed remote transmission. Unable to reach pt at home due to caller id restrictions, cell was disconnected.

## 2018-04-02 ENCOUNTER — Other Ambulatory Visit: Payer: Self-pay | Admitting: Internal Medicine

## 2018-04-05 ENCOUNTER — Ambulatory Visit (INDEPENDENT_AMBULATORY_CARE_PROVIDER_SITE_OTHER): Payer: Medicare HMO

## 2018-04-05 DIAGNOSIS — I442 Atrioventricular block, complete: Secondary | ICD-10-CM | POA: Diagnosis not present

## 2018-04-07 LAB — CUP PACEART REMOTE DEVICE CHECK
Battery Impedance: 352 Ohm
Battery Remaining Longevity: 113 mo
Brady Statistic RV Percent Paced: 99 %
Date Time Interrogation Session: 20200103195118
Implantable Lead Implant Date: 20140710
Implantable Lead Location: 753859
Implantable Lead Location: 753860
Implantable Lead Model: 5092
Implantable Lead Model: 5592
Implantable Pulse Generator Implant Date: 20140710
Lead Channel Impedance Value: 67 Ohm
Lead Channel Impedance Value: 830 Ohm
Lead Channel Pacing Threshold Amplitude: 0.375 V
MDC IDC LEAD IMPLANT DT: 20140710
MDC IDC MSMT BATTERY VOLTAGE: 2.79 V
MDC IDC MSMT LEADCHNL RV PACING THRESHOLD PULSEWIDTH: 0.4 ms
MDC IDC SET LEADCHNL RV PACING AMPLITUDE: 2.5 V
MDC IDC SET LEADCHNL RV PACING PULSEWIDTH: 0.4 ms
MDC IDC SET LEADCHNL RV SENSING SENSITIVITY: 4 mV

## 2018-04-08 NOTE — Progress Notes (Signed)
Remote pacemaker transmission.   

## 2018-04-10 ENCOUNTER — Telehealth: Payer: Self-pay | Admitting: Emergency Medicine

## 2018-04-10 NOTE — Telephone Encounter (Signed)
Pt dropped off parking placard form to be filled out. Placed in Brittany's box to be filled out and signed by provider. Please call patient to pick up when completed thanks.

## 2018-04-10 NOTE — Telephone Encounter (Signed)
Renewal for parking placard has been completed & placed in provider box to sign.

## 2018-04-11 ENCOUNTER — Other Ambulatory Visit: Payer: Self-pay | Admitting: Internal Medicine

## 2018-04-11 NOTE — Telephone Encounter (Signed)
Called to see if they can come pick up handicap sticker. Advised Pt it was completed and in provider box. Pt will cal tomorrow to see if ready for pickup.

## 2018-04-12 NOTE — Telephone Encounter (Signed)
Pt calling to check status. Please advise  °

## 2018-04-12 NOTE — Telephone Encounter (Signed)
I have not received back from Dr. Alain Marion.

## 2018-04-15 NOTE — Telephone Encounter (Signed)
Called patient a couple times but I kept getting a busy sound.   Parking placard is ready to be picked up.  Copy sent to scan.

## 2018-04-17 ENCOUNTER — Other Ambulatory Visit (HOSPITAL_COMMUNITY): Payer: Self-pay | Admitting: Internal Medicine

## 2018-05-23 ENCOUNTER — Other Ambulatory Visit: Payer: Self-pay

## 2018-05-23 NOTE — Patient Outreach (Signed)
[place] [facility]) Care Management  05/23/2018   Jessica [name] Jan 13, 1934 [id number]  Subjective: Successful call to the patient.  HIPAA verified.  The patient states that she is sad this morning.  He next door neighbor that helps her died yesterday. RN Health Coach offered her condolences. The patient states other wise she is doing fine. She denies any chest pain , shortness of breath, swelling or falls. She states that she did not weigh today but yesterday it was 239 lbs.  She states that she will weigh two to three times a week.  Discussed with the patient why it is important to weigh daly.  She verbalized understanding.  The patient is taking her medication.  She states that she was started on a new medication Farxiga 5 mg 1 daily. She states that the medication will cost her 255 dollars for 30 pills.  She does not have that kind of money.  She has samples for right now, till she goes to her appointment on March 2,2020.    Current Medications:  Current Outpatient Medications  Medication Sig Dispense Refill  . acetaminophen (TYLENOL) 325 MG tablet Take 2 tablets (650 mg total) by mouth every 6 (six) hours as needed for mild pain (or Fever >/= 101).    [name] allopurinol (ZYLOPRIM) 100 MG tablet TAKE 1 TABLET EVERY DAY 90 tablet 3  . b complex vitamins tablet Take 1 tablet by mouth daily.    . Blood Glucose Monitoring Suppl (TRUE METRIX AIR GLUCOSE METER) w/Device KIT CHECK BLOOD SUGAR TWICE DAILY 1 kit 0  . Cholecalciferol 1000 UNITS tablet Take 1,000 Units by mouth daily.      . dapagliflozin propanediol (FARXIGA) 5 MG TABS tablet Take 5 mg by mouth daily. 30 tablet 11  . ELIQUIS 5 MG TABS tablet TAKE 1 TABLET TWICE DAILY 180 tablet 3  . ENTRESTO 24-26 MG TAKE 1 TABLET BY MOUTH TWICE A DAY 60 tablet 5  . furosemide (LASIX) 20 MG tablet TAKE 2 TABLETS BY MOUTH EVERY DAY 60 tablet 3  . furosemide (LASIX) 40 MG tablet TAKE 1 TABLET TWICE DAILY 180 tablet 1  . KLOR-CON M20 20 MEQ  tablet TAKE 1 TABLET BY MOUTH EVERY DAY 90 tablet 1  . levothyroxine (SYNTHROID, LEVOTHROID) 75 MCG tablet TAKE 1 TABLET EVERY DAY 90 tablet 3  . LORazepam (ATIVAN) 0.5 MG tablet Take 1 tablet (0.5 mg total) by mouth 2 (two) times daily. 180 tablet 1  . losartan (COZAAR) 100 MG tablet TAKE 1 TABLET EVERY DAY 90 tablet 3  . metFORMIN (GLUCOPHAGE-XR) 750 MG 24 hr tablet TAKE 1 TABLET (750 MG TOTAL) BY MOUTH DAILY WITH BREAKFAST. 90 tablet 3  . metoprolol succinate (TOPROL XL) 50 MG 24 hr tablet Take 1 tablet (50 mg total) by mouth daily. Take with or immediately following a meal. 90 tablet 1  . metoprolol succinate (TOPROL-XL) 50 MG 24 hr tablet TAKE 1 TABLET (50 MG TOTAL) BY MOUTH DAILY. TAKE WITH OR IMMEDIATELY FOLLOWING A MEAL. 90 tablet 1  . Multiple Vitamins-Minerals (OCUVITE PRESERVISION) TABS Take 1 tablet by mouth 2 (two) times daily.    . multivitamin-iron-minerals-folic acid (CENTRUM) chewable tablet Chew 1 tablet by mouth daily.    [name] omeprazole (PRILOSEC) 40 MG capsule TAKE 1 CAPSULE EVERY DAY 90 capsule 3  . Semaglutide,0.25 or 0.5MG/DOS, (OZEMPIC, 0.25 OR 0.5 MG/DOSE,) 2 MG/1.5ML SOPN Inject 0.5 mg into the skin once a week. Start 0.25 mg weekly for 4 weeks, then go to  0.5 mg weekly sq 1 pen 11  . temazepam (RESTORIL) 15 MG capsule Take 1 capsule (15 mg total) by mouth at bedtime as needed for sleep. 90 capsule 1  . temazepam (RESTORIL) 15 MG capsule TAKE 1 CAPSULE (15 MG TOTAL) BY MOUTH AT BEDTIME AS NEEDED FOR SLEEP. (Patient not taking: Reported on 03/20/2018) 30 capsule 5  . tetrahydrozoline 0.05 % ophthalmic solution Place 1 drop into both eyes daily.    . TRUE METRIX BLOOD GLUCOSE TEST test strip CHECK BLOOD SUGAR TWICE DAILY 200 each 1  . TRUEPLUS LANCETS 33G MISC CHECK BLOOD SUGARS TWICE A DAY 200 each 1  . venlafaxine XR (EFFEXOR-XR) 75 MG 24 hr capsule TAKE 1 CAPSULE EVERY DAY WITH BREAKFAST 90 capsule 2   No current facility-administered medications for this visit.      Functional Status:  In your present state of health, do you have any difficulty performing the following activities: 12/21/2017 07/12/2017  Hearing? N N  Vision? N N  Difficulty concentrating or making decisions? N N  Walking or climbing stairs? N Y  Dressing or bathing? N N  Doing errands, shopping? N N  Preparing Food and eating ? N -  Using the Toilet? N -  In the past six months, have you accidently leaked urine? N -  Do you have problems with loss of bowel control? N -  Managing your Medications? N -  Managing your Finances? N -  Housekeeping or managing your Housekeeping? N -  Some recent data might be hidden    Fall/Depression Screening: Fall Risk  05/23/2018 03/20/2018 02/18/2018  Falls in the past year? 0 0 0  Number falls in past yr: - - -  Injury with Fall? - - -  Risk for fall due to : - - -  Follow up - - -   PHQ 2/9 Scores 12/21/2017 07/12/2017 12/20/2016 12/08/2016 07/10/2016 10/27/2015 10/19/2014  PHQ - 2 Score '1 2 2 ' 0 - 0 0  PHQ- 9 Score '2 3 3 ' - - - -  Exception Documentation - - - - Medical reason - -    Assessment: Patient will continue to benefit from health coach outreach for disease management and support. THN CM Care Plan Problem One     Most Recent Value  THN Long Term Goal   In 60 days the patient will verbalize not having an admission in the [place] Term Goal Start Date  05/23/18  Interventions for Problem One Long Term Goal  Discussed with th patient about signs and symptoms of CHF,  The patient states that she know the action plan,  She does not have an appointment set for Cardiology and the patient was asked to cll and schedule her next appointment she states that she will..    Plan: RN Health Coach will contact patient in the month of April and patient agrees to next outreach. [place] sent a referral to pharmacy for medication assistance.  Lazaro Arms RN, BSN, [place] Direct  Dial:  [phone]  Fax: [phone]

## 2018-05-27 ENCOUNTER — Ambulatory Visit: Payer: Self-pay | Admitting: Pharmacist

## 2018-05-30 ENCOUNTER — Other Ambulatory Visit: Payer: Self-pay | Admitting: Pharmacy Technician

## 2018-05-30 ENCOUNTER — Ambulatory Visit: Payer: Self-pay | Admitting: Pharmacist

## 2018-05-30 NOTE — Patient Outreach (Signed)
[place] [facility]) Care Management  05/30/2018  RITAJ DULLEA [birth year] [id number]                                                  Medication Assistance Referral  Referral From: [facility] R[facility] Jenne Pane  Medication/Company: [name] / [employer] Patient application portion: N/A Provider application portion: Faxed  to Dr. Alain Marion  Medication/Company: Delene Loll / [employer] Patient application portion: N/A Provider application portion: Faxed  to Dr. Rayann Heman    Follow up:  Will submit completed applications to companies once received.  [name] [place] Certified Pharmacy Technician [place] Management Direct Dial:[phone]

## 2018-05-31 ENCOUNTER — Other Ambulatory Visit: Payer: Self-pay | Admitting: Pharmacist

## 2018-05-31 ENCOUNTER — Telehealth: Payer: Self-pay

## 2018-05-31 NOTE — Telephone Encounter (Signed)
We received the provider part of a [employer] Pt asst application from Etter Sjogren, CPhT from [facility] for the pts Entresto. Dr Oval Linsey prescribed this medication for the pt but it was sent to [place] under Dr Bonita Quin name. I have completed the provider part of the application and placed it in Dr Bonita Quin mail bin awaiting his signature.

## 2018-06-03 ENCOUNTER — Ambulatory Visit (INDEPENDENT_AMBULATORY_CARE_PROVIDER_SITE_OTHER): Payer: HMO | Admitting: Internal Medicine

## 2018-06-03 ENCOUNTER — Other Ambulatory Visit (INDEPENDENT_AMBULATORY_CARE_PROVIDER_SITE_OTHER): Payer: HMO

## 2018-06-03 ENCOUNTER — Encounter: Payer: Self-pay | Admitting: Internal Medicine

## 2018-06-03 VITALS — BP 112/76 | HR 78 | Temp 98.0°F | Ht 62.0 in | Wt 239.0 lb

## 2018-06-03 DIAGNOSIS — E118 Type 2 diabetes mellitus with unspecified complications: Secondary | ICD-10-CM

## 2018-06-03 DIAGNOSIS — I10 Essential (primary) hypertension: Secondary | ICD-10-CM | POA: Diagnosis not present

## 2018-06-03 DIAGNOSIS — I480 Paroxysmal atrial fibrillation: Secondary | ICD-10-CM

## 2018-06-03 DIAGNOSIS — R0602 Shortness of breath: Secondary | ICD-10-CM

## 2018-06-03 DIAGNOSIS — I5043 Acute on chronic combined systolic (congestive) and diastolic (congestive) heart failure: Secondary | ICD-10-CM

## 2018-06-03 LAB — HEMOGLOBIN A1C: HEMOGLOBIN A1C: 7.3 % — AB (ref 4.6–6.5)

## 2018-06-03 LAB — BASIC METABOLIC PANEL
BUN: 26 mg/dL — ABNORMAL HIGH (ref 6–23)
CO2: 26 mEq/L (ref 19–32)
Calcium: 10.9 mg/dL — ABNORMAL HIGH (ref 8.4–10.5)
Chloride: 99 mEq/L (ref 96–112)
Creatinine, Ser: 1.03 mg/dL (ref 0.40–1.20)
GFR: 51.21 mL/min — AB (ref >60.00)
Glucose, Bld: 116 mg/dL — ABNORMAL HIGH (ref 70–99)
POTASSIUM: 4.2 meq/L (ref 3.5–5.1)
Sodium: 136 mEq/L (ref 135–145)

## 2018-06-03 NOTE — Assessment & Plan Note (Signed)
Ozempic, Wilder Glade - very $$$

## 2018-06-03 NOTE — Assessment & Plan Note (Signed)
BP Readings from Last 3 Encounters:  06/03/18 112/76  03/11/18 112/78  02/26/18 126/80

## 2018-06-03 NOTE — Assessment & Plan Note (Signed)
Entresto

## 2018-06-03 NOTE — Assessment & Plan Note (Signed)
On Eliquis

## 2018-06-03 NOTE — Assessment & Plan Note (Signed)
Better w/wt loss  

## 2018-06-03 NOTE — Progress Notes (Signed)
Subjective:  Patient ID: Jessica [name], female    DOB: Dec 26, 1933  Age: 85 y.o. MRN: [id number]  CC: No chief complaint on file.   HPI BRITANNI YARDE presents for obesity, HTN, LBP f/u  Outpatient Medications Prior to Visit  Medication Sig Dispense Refill  . acetaminophen (TYLENOL) 325 MG tablet Take 2 tablets (650 mg total) by mouth every 6 (six) hours as needed for mild pain (or Fever >/= 101).    [name] allopurinol (ZYLOPRIM) 100 MG tablet TAKE 1 TABLET EVERY DAY 90 tablet 3  . b complex vitamins tablet Take 1 tablet by mouth daily.    . Blood Glucose Monitoring Suppl (TRUE METRIX AIR GLUCOSE METER) w/Device KIT CHECK BLOOD SUGAR TWICE DAILY 1 kit 0  . Cholecalciferol 1000 UNITS tablet Take 1,000 Units by mouth daily.      . dapagliflozin propanediol (FARXIGA) 5 MG TABS tablet Take 5 mg by mouth daily. 30 tablet 11  . ELIQUIS 5 MG TABS tablet TAKE 1 TABLET TWICE DAILY 180 tablet 3  . ENTRESTO 24-26 MG TAKE 1 TABLET BY MOUTH TWICE A DAY 60 tablet 5  . furosemide (LASIX) 20 MG tablet TAKE 2 TABLETS BY MOUTH EVERY DAY 60 tablet 3  . furosemide (LASIX) 40 MG tablet TAKE 1 TABLET TWICE DAILY 180 tablet 1  . KLOR-CON M20 20 MEQ tablet TAKE 1 TABLET BY MOUTH EVERY DAY 90 tablet 1  . levothyroxine (SYNTHROID, LEVOTHROID) 75 MCG tablet TAKE 1 TABLET EVERY DAY 90 tablet 3  . LORazepam (ATIVAN) 0.5 MG tablet Take 1 tablet (0.5 mg total) by mouth 2 (two) times daily. 180 tablet 1  . losartan (COZAAR) 100 MG tablet TAKE 1 TABLET EVERY DAY 90 tablet 3  . metFORMIN (GLUCOPHAGE-XR) 750 MG 24 hr tablet TAKE 1 TABLET (750 MG TOTAL) BY MOUTH DAILY WITH BREAKFAST. 90 tablet 3  . metoprolol succinate (TOPROL XL) 50 MG 24 hr tablet Take 1 tablet (50 mg total) by mouth daily. Take with or immediately following a meal. 90 tablet 1  . metoprolol succinate (TOPROL-XL) 50 MG 24 hr tablet TAKE 1 TABLET (50 MG TOTAL) BY MOUTH DAILY. TAKE WITH OR IMMEDIATELY FOLLOWING A MEAL. 90 tablet 1  . Multiple  Vitamins-Minerals (OCUVITE PRESERVISION) TABS Take 1 tablet by mouth 2 (two) times daily.    . multivitamin-iron-minerals-folic acid (CENTRUM) chewable tablet Chew 1 tablet by mouth daily.    [name] omeprazole (PRILOSEC) 40 MG capsule TAKE 1 CAPSULE EVERY DAY 90 capsule 3  . temazepam (RESTORIL) 15 MG capsule Take 1 capsule (15 mg total) by mouth at bedtime as needed for sleep. 90 capsule 1  . temazepam (RESTORIL) 15 MG capsule TAKE 1 CAPSULE (15 MG TOTAL) BY MOUTH AT BEDTIME AS NEEDED FOR SLEEP. 30 capsule 5  . tetrahydrozoline 0.05 % ophthalmic solution Place 1 drop into both eyes daily.    . TRUE METRIX BLOOD GLUCOSE TEST test strip CHECK BLOOD SUGAR TWICE DAILY 200 each 1  . TRUEPLUS LANCETS 33G MISC CHECK BLOOD SUGARS TWICE A DAY 200 each 1  . venlafaxine XR (EFFEXOR-XR) 75 MG 24 hr capsule TAKE 1 CAPSULE EVERY DAY WITH BREAKFAST 90 capsule 2  . Semaglutide,0.25 or 0.5MG/DOS, (OZEMPIC, 0.25 OR 0.5 MG/DOSE,) 2 MG/1.5ML SOPN Inject 0.5 mg into the skin once a week. Start 0.25 mg weekly for 4 weeks, then go to 0.5 mg weekly sq (Patient not taking: Reported on 06/03/2018) 1 pen 11   No facility-administered medications prior to visit.  ROS: Review of Systems  Constitutional: Positive for fatigue. Negative for activity change, appetite change, chills and unexpected weight change.  HENT: Negative for congestion, mouth sores and sinus pressure.   Eyes: Negative for visual disturbance.  Respiratory: Negative for cough and chest tightness.   Gastrointestinal: Negative for abdominal pain and nausea.  Genitourinary: Negative for difficulty urinating, frequency and vaginal pain.  Musculoskeletal: Positive for back pain. Negative for gait problem.  Skin: Negative for pallor and rash.  Neurological: Negative for dizziness, tremors, weakness, numbness and headaches.  Psychiatric/Behavioral: Positive for dysphoric mood. Negative for confusion, sleep disturbance and suicidal ideas.    Objective:  BP  112/76 (BP Location: Left Arm, Patient Position: Sitting, Cuff Size: Large)   Pulse 78   Temp 98 F (36.7 C) (Oral)   Ht '5\' 2"'  (1.575 m)   Wt 239 lb (108.4 kg)   SpO2 96%   BMI 43.71 kg/m   BP Readings from Last 3 Encounters:  06/03/18 112/76  03/11/18 112/78  02/26/18 126/80    Wt Readings from Last 3 Encounters:  06/03/18 239 lb (108.4 kg)  03/11/18 246 lb 3.2 oz (111.7 kg)  02/26/18 245 lb (111.1 kg)    Physical Exam Constitutional:      General: She is not in acute distress.    Appearance: Normal appearance. She is well-developed. She is obese.  HENT:     Head: Normocephalic.     Right Ear: External ear normal.     Left Ear: External ear normal.     Nose: Nose normal.  Eyes:     General:        Right eye: No discharge.        Left eye: No discharge.     Conjunctiva/sclera: Conjunctivae normal.     Pupils: Pupils are equal, round, and reactive to light.  Neck:     Musculoskeletal: Normal range of motion and neck supple.     Thyroid: No thyromegaly.     Vascular: No JVD.     Trachea: No tracheal deviation.  Cardiovascular:     Rate and Rhythm: Normal rate and regular rhythm.     Heart sounds: Normal heart sounds.  Pulmonary:     Effort: No respiratory distress.     Breath sounds: No stridor. No wheezing.  Abdominal:     General: Bowel sounds are normal. There is no distension.     Palpations: Abdomen is soft. There is no mass.     Tenderness: There is no abdominal tenderness. There is no guarding or rebound.  Musculoskeletal:        General: Tenderness present.     Right lower leg: Edema present.     Left lower leg: Edema present.  Lymphadenopathy:     Cervical: No cervical adenopathy.  Skin:    Findings: No erythema or rash.  Neurological:     Mental Status: She is oriented to person, place, and time.     Cranial Nerves: No cranial nerve deficit.     Motor: No abnormal muscle tone.     Coordination: Coordination normal.     Deep Tendon Reflexes:  Reflexes normal.  Psychiatric:        Behavior: Behavior normal.        Thought Content: Thought content normal.        Judgment: Judgment normal.   trace edema B LS - pain w/ROM  Lab Results  Component Value Date   WBC 7.5 06/26/2017   HGB 13.2 06/26/2017  HCT 40.2 06/26/2017   PLT 360 06/26/2017   GLUCOSE 122 (H) 02/26/2018   CHOL 191 02/19/2017   TRIG 183.0 (H) 02/19/2017   HDL 38.90 (L) 02/19/2017   LDLDIRECT 155.9 09/30/2012   LDLCALC 116 (H) 02/19/2017   ALT 15 06/22/2017   AST 24 06/22/2017   NA 137 02/26/2018   K 3.8 02/26/2018   CL 98 02/26/2018   CREATININE 0.88 02/26/2018   BUN 22 02/26/2018   CO2 28 02/26/2018   TSH 2.771 06/22/2017   INR 1.68 06/23/2017   HGBA1C 7.3 (H) 02/26/2018   MICROALBUR 4.5 (H) 12/02/2009    Dexascan  Result Date: 01/06/2018 Date of study: 01/04/18 Exam: DUAL X-RAY ABSORPTIOMETRY (DXA) FOR BONE MINERAL DENSITY (BMD) Instrument: [employer] Provider: PCP Indication: screening for osteoporosis Comparison: none (please note that it is not possible to compare data from different instruments) Clinical data: Pt is a 85 y.o. female with previous fractures. Results:  Lumbar spine L1-L4 Femoral neck (FN) 33% distal radius T-score 4.9 RFN: 2.5 LFN: 1.4 n/a Change in BMD from previous DXA test (%) n/a n/a n/a (*) statistically significant Assessment: the BMD is normal according to the [facility] classification for osteoporosis (see below). Fracture risk: low FRAX score: not calculated due to normal BMD Comments: the technical quality of the study is good  Degenerative change may falsely elevate spine score. Recommend optimizing calcium (1200 mg/day) and vitamin D (800 IU/day) intake. No pharmacological treatment is indicated. Followup: Repeat BMD is appropriate after 2 years. WHO criteria for diagnosis of osteoporosis in postmenopausal women and in men 67 y/o or older: - normal: T-score -1.0 to + 1.0 - osteopenia/low bone density: T-score  between -2.5 and -1.0 - osteoporosis: T-score below -2.5 - severe osteoporosis: T-score below -2.5 with history of fragility fracture Note: although not part of the WHO classification, the presence of a fragility fracture, regardless of the T-score, should be considered diagnostic of osteoporosis, provided other causes for the fracture have been excluded. Loura Pardon MD    Assessment & Plan:   There are no diagnoses linked to this encounter.   No orders of the defined types were placed in this encounter.    Follow-up: No follow-ups on file.  Walker Kehr, MD

## 2018-06-04 NOTE — Patient Outreach (Signed)
[place] [facility]) Care Management  [place]   06/04/2018  LUREE PALLA February 09, 1934 [id number]  Reason for referral: Medication Assistance with Koren Shiver & DM meds  Referral source: Health Team Advantage Current insurance:Health Team Advantage  PMHx includes but not limited to:  T2DM, Afib, HTN, CHF, GERD, Hypothyroidism.  Outreach:  Successful home visit with Ms. Veiga.  HIPAA identifiers verified.  Patient is doing well today.  No edema noted in ankles.  She denies SOB/wheezing/etc.  She reports 100% compliance with medications.  She is having difficulties paying for Ozempic, Entresto and Eliquis.  She has self-discontinued the Ozempic and would like to stay with a similar oral agent.  PCP has recently started [place], however patient has been getting samples.  Her A1c on 06/03/18 was 7.3.    Patient would qualify for medication assistance for Jardiance through [place].  Will contact MD to see if switch to [name] is appropriate. Will continue applying for patient assistance for Entresto.  Patient will be able to apply for Eliquis once 3% OOP has been spent.  Will touch base with patient at a later time to assess for OOP.   Lab Results  Component Value Date   HGBA1C 7.3 (H) 06/03/2018   Assessment:  Drugs sorted by system:  Neurologic/Psychologic: lorazepam, temazepam, venlafaxine  Cardiovascular: Entresto, Eliquis, Lasix, losartan, metoprolol  Gastrointestinal: omeprazole  Endocrine: metformin, Farxiga, levothyroxine  Vitamins/Minerals/Supplements: MVI, potassium, vit B complex  Miscellaneous: allopurinol   Plan: -PAP applications returned to [place], Etter Sjogren to see process forward. -F/u patient's Scr (on ARB), clarify latest lasix RX -I will f/u with patient within 2 weeks to see if medications were changed at recent PCP visit.  [name], PharmD, [place]  [phone]

## 2018-06-07 ENCOUNTER — Other Ambulatory Visit: Payer: Self-pay | Admitting: Pharmacy Technician

## 2018-06-07 NOTE — Patient Outreach (Signed)
[place] [facility]) Care Management  06/07/2018  Jessica [name] Oct 25, 1933 [id number]   Received provider portion(s) of patient assistance application for Entresto. Faxed completed application and required documents into [employer].  Will follow up with company in 7-10 business days to check status of application.  [name] [place] Certified Pharmacy Technician [place] Management Direct Dial:[phone]

## 2018-06-10 ENCOUNTER — Ambulatory Visit: Payer: Self-pay | Admitting: Pharmacist

## 2018-06-10 ENCOUNTER — Other Ambulatory Visit: Payer: Self-pay | Admitting: Cardiovascular Disease

## 2018-06-10 ENCOUNTER — Other Ambulatory Visit: Payer: Self-pay

## 2018-06-10 NOTE — Patient Outreach (Addendum)
[place] [facility]) Care Management  06/10/2018  Jessica [name] Nov 12, 1933 [id number]    RN Health had a successful outreach to the patient.  HIPAA verified.  Informed the patient that I will no longer be calling her now that she has the Chronic Special Needs Plan with Health Team Advantage.  Gave the patient the name of Thea Silversmith RN her new CM and contact information She states that she was sad to see me go but I assured her she is in good hands.    Plan:  RN Health Coach to close the program due to the consumer enrolled in another CM program. Will send physician notification.  Lazaro Arms RN, BSN, [place] Direct Dial:  [phone]  Fax: [phone]

## 2018-06-19 ENCOUNTER — Other Ambulatory Visit: Payer: Self-pay | Admitting: Pharmacy Technician

## 2018-06-19 NOTE — Patient Outreach (Signed)
[name]  06/19/2018  Jessica [name] 09-21-1933 [id number]   Received provider portion(s) of patient assistance application for Jardiance. Faxed completed application and required documents into [employer].  Will follow up with company in 7-10 business days to check status of application.  [name] [name] Certified Pharmacy Technician [place] Management Direct Dial:[phone]

## 2018-06-26 ENCOUNTER — Telehealth: Payer: Self-pay | Admitting: Cardiovascular Disease

## 2018-06-26 NOTE — Telephone Encounter (Signed)
New Message         Jessica [name] is calling today to get authorization for  "Entresto" for the patient. Pls call and advise.

## 2018-06-26 NOTE — Telephone Encounter (Signed)
Will forward to [place] who is working on PA per phone note from 2/28

## 2018-06-28 NOTE — Telephone Encounter (Signed)
**Note De-Identified  Obfuscation** [name] signed the provider part of the pts application and I faxed it to Etter Sjogren, CPhT on 06/19/2018.

## 2018-06-28 NOTE — Telephone Encounter (Signed)
Will forward call to Etter Sjogren, CPhT [facility]) who is working on this.

## 2018-06-28 NOTE — Telephone Encounter (Signed)
Per Etter Sjogren, CPhT we need to contact HTA as a PA has been started on the pts Entresto and that [employer] states that until the PA is resolved with the pts insurance they cannot grant the pt asst for her Entresto.  I called HTA and s/w [place] who transferred me to [place] who transferred me to [place]. I did the [place] PA as URGENT over the phone with [facility] who stated that when she submitted the PA she got an immediate approval. Per [name] is a tier 6 medication on the pts plan and will cost the her $0.00.  [facility] called [facility] and was advised that the prescription did go through. I called and left a detailed message on the pts VM stating that her Delene Loll PA has been approved and that her pharmacy is getting the RX ready for her.

## 2018-07-01 ENCOUNTER — Ambulatory Visit: Payer: Self-pay | Admitting: Pharmacist

## 2018-07-01 ENCOUNTER — Other Ambulatory Visit: Payer: Self-pay

## 2018-07-01 ENCOUNTER — Other Ambulatory Visit: Payer: Self-pay | Admitting: Pharmacist

## 2018-07-01 NOTE — Patient Outreach (Signed)
[place] [facility]) Care Management  07/01/2018  Jessica [name] October 04, 1933 [id number]   Successful outreach to Jessica [name] with HIPAA identifiers verified x2.  Patient states she is doing well today.  She is grateful for [place] efforts.  Patient states she has #15 tablets of Entresto.  Informed patient that remaining tablets of [facility]rest[facility]  [place] patient assistance application for Delene Loll is pending.  Patient has not received Jardiance in the mail, however knows to be on the lookout for it.  Appreciate THN CPht, Etter Sjogren for obtaining and assisting with medication assistance.  Patient reports her BGs are within normal limits.  No other pharmacy needs identified at this time.  Encouraged patient to call if needs arise.  PLAN: -Will continue quarterly HTA-CSNP outreach    [name], PharmD, [place]  [phone]

## 2018-07-05 ENCOUNTER — Ambulatory Visit (INDEPENDENT_AMBULATORY_CARE_PROVIDER_SITE_OTHER): Payer: HMO | Admitting: *Deleted

## 2018-07-05 ENCOUNTER — Other Ambulatory Visit: Payer: Self-pay

## 2018-07-05 DIAGNOSIS — I442 Atrioventricular block, complete: Secondary | ICD-10-CM

## 2018-07-08 ENCOUNTER — Telehealth: Payer: Self-pay

## 2018-07-08 LAB — CUP PACEART REMOTE DEVICE CHECK
Battery Impedance: 377 Ohm
Battery Remaining Longevity: 107 mo
Battery Voltage: 2.79 V
Brady Statistic RV Percent Paced: 99 %
Date Time Interrogation Session: 20200406134338
Implantable Lead Implant Date: 20140710
Implantable Lead Implant Date: 20140710
Implantable Lead Location: 753859
Implantable Lead Location: 753860
Implantable Lead Model: 5092
Implantable Lead Model: 5592
Implantable Pulse Generator Implant Date: 20140710
Lead Channel Impedance Value: 67 Ohm
Lead Channel Impedance Value: 694 Ohm
Lead Channel Pacing Threshold Amplitude: 0.5 V
Lead Channel Pacing Threshold Pulse Width: 0.4 ms
Lead Channel Setting Pacing Amplitude: 2.5 V
Lead Channel Setting Pacing Pulse Width: 0.4 ms
Lead Channel Setting Sensing Sensitivity: 4 mV

## 2018-07-08 NOTE — Telephone Encounter (Signed)
Spoke with patient to remind of missed remote transmission 

## 2018-07-09 ENCOUNTER — Other Ambulatory Visit: Payer: Self-pay | Admitting: Internal Medicine

## 2018-07-10 ENCOUNTER — Other Ambulatory Visit: Payer: Self-pay | Admitting: Pharmacy Technician

## 2018-07-10 NOTE — Patient Outreach (Signed)
[place] [facility]) Care Management  07/10/2018  SHAKILA MAK 04-15-1933 [id number]    Follow up call placed to [employer] regarding patient assistance application(s) for Randa Spike states patient has been temporarily approved and LIS letter will need to be submitted.   Jenny Reichmann also states that pharmacy needs clarification of drug being changed from [place] to [place] so that 3 month supply can be mailed out. Informed THN RPh Lottie Dawson and provided her with pharmacy number. Also informed [name] about LIS requirement.  Follow up:  Will submit LIS denial or proof of copay amount to B-[employer] once documents have been obtained.  [name] [place] Certified Pharmacy Technician [place] Management Direct [phone]

## 2018-07-11 ENCOUNTER — Ambulatory Visit: Payer: Self-pay | Admitting: Pharmacist

## 2018-07-11 ENCOUNTER — Other Ambulatory Visit: Payer: Self-pay | Admitting: Pharmacist

## 2018-07-11 ENCOUNTER — Other Ambulatory Visit: Payer: Self-pay

## 2018-07-11 NOTE — Progress Notes (Signed)
Remote pacemaker transmission.   

## 2018-07-11 NOTE — Patient Outreach (Signed)
[place] [facility]) Care Management  [place]  07/11/2018  MAKIYAH ZENTZ 1933-04-10 [id number]  Care coordination call to patient assistance pharmacy [phone] (option 3) to confirm that [name] will be replacing [place].  [name] will ship out today and anticipated delivery on Tuesday of next week to patient.  Successful outreach to Ms. Higinbotham with HIPAA identifiers verified x2.  Patient is doing well and has no concerns.  She just received her Jardiance in the mail.  Reminded patient that this is replacing her [place].  Will call pharmacy to local cancel Farxiga.  Patient is tolerating this medications class (SGLT2) well.  She is grateful for assistance.  [employer] shipped 3 months worth of Jardiance to patient and is now asking for LIS/extra help denial.  Patient has been denied for LIS and has denial paper.  She is unable to send this to me.  I will retrieve this letter once COVID restrictions are lifted.  PLAN: -I will f/u with patient regarding LIS denial letter next month -Will continue HTA CSNP outreach   [name], PharmD, [place]  [phone]

## 2018-07-12 ENCOUNTER — Other Ambulatory Visit: Payer: Self-pay | Admitting: Internal Medicine

## 2018-07-16 ENCOUNTER — Ambulatory Visit: Payer: Self-pay | Admitting: Pharmacist

## 2018-07-18 ENCOUNTER — Other Ambulatory Visit: Payer: Self-pay | Admitting: Internal Medicine

## 2018-07-18 MED ORDER — APIXABAN 5 MG PO TABS: 5.0000 mg | ORAL_TABLET | Freq: Two times a day (BID) | ORAL | 3 refills | Status: DC

## 2018-07-18 NOTE — Telephone Encounter (Signed)
Requested Prescriptions  Pending Prescriptions Disp Refills  . apixaban (ELIQUIS) 5 MG TABS tablet 180 tablet 3    Sig: Take 1 tablet (5 mg total) by mouth 2 (two) times daily.     Hematology:  Anticoagulants Failed - 07/18/2018  4:52 PM      Failed - HGB in normal range and within 360 days    Hemoglobin  Date Value Ref Range Status  06/26/2017 13.2 12.0 - 15.0 g/dL Final         Failed - PLT in normal range and within 360 days    Platelets  Date Value Ref Range Status  06/26/2017 360 150 - 400 K/uL Final    Comment:    Performed at [facility], [place] [address] [place], [place] [address]         Failed - HCT in normal range and within 360 days    HCT  Date Value Ref Range Status  06/26/2017 40.2 36.0 - 46.0 % Final         Passed - Cr in normal range and within 360 days    Creatinine, Ser  Date Value Ref Range Status  06/03/2018 1.03 0.40 - 1.20 mg/dL Final         Passed - Valid encounter within last 12 months    Recent Outpatient Visits          1 month ago CHF (congestive heart failure), NYHA class II, acute on chronic, combined ([place])   [place] Plotnikov, Evie Lacks, MD   4 months ago Localized edema   [place], MD   7 months ago DOE (dyspnea on exertion)   [place], MD   10 months ago DOE (dyspnea on exertion)   [place], MD   1 year ago Paroxysmal atrial fibrillation [facility])   [place], Evie Lacks, MD      Future Appointments            In 1 month Plotnikov, Evie Lacks, MD [place], [place]   In 5 months  [place], [facility]

## 2018-07-19 ENCOUNTER — Other Ambulatory Visit: Payer: Self-pay

## 2018-07-19 NOTE — Patient Outreach (Signed)
  [place] [facility]) Care Management Chronic Special Needs Program  07/19/2018  Name: Jessica [name] DOB: 1933/10/02  MRN: [id number]  Ms. Jessica [name] is enrolled in a Chronic Special Needs Plan. RNCM called to review health risk assessment and complete individualized care plan. No answer. HIPPA compliant message left.   Plan: Chronic care management coordinator will attempt outreach next week.  Thea Silversmith, RN, MSN, [place] [phone]

## 2018-07-19 NOTE — Patient Outreach (Addendum)
[place] [facility]) Care Management Chronic Special Needs Program  07/19/2018  Name: Jessica [name] DOB: 1933-12-24  MRN: [id number]  Ms. Jessica [name] is enrolled in a chronic special needs plan for Heart Failure. Chronic Care Management Coordinator telephoned client to review health risk assessment and to develop individualized care plan.  Introduced the chronic care management program, importance of client participation, and taking their care plan to all provider appointments and inpatient facilities.  Reviewed the transition of care process and possible referral to community care management.  Subjective: Client reports she just returned from picking up medications and the cost of Elliquis was $254 for a 90-day supply. She reports she is taking Entresto, Elliquis and [place]. She acknowledges [place] is currently for [place] assistance. Client acknowledges that she feels down because of COVID-19 and having to stay in, but understands the importance. She states, she has not been out since stay at home order was implemented until today when she went to pick medications up from pharmacy. PhQ2-2, PHQ9 -7. Client states she is taking Effexor and denies any additional medication needs or counseling need. Declines social work referral.    Goals Addressed            This Visit's Progress   .  Acknowledge receipt of Programme researcher, broadcasting/film/video      . Advanced Care Planning complete as directed by client      . Be as active and as independent as possible prior goal       Enjoy life, family and worship God    . Client understands the importance of follow-up with providers by attending scheduled visits      . Client verbalize knowledge of Heart Failure diease self management skills within the next 6 months.       . Client will report no worsening of symptoms of Atrial Fibrillation within the next 6 months      . Client will report no worsening of symptoms related to  heart disease within the next 6 months       . Client will verbalize knowledge of diabetes self-management as evidenced by Hgb A1C <7 or as defined by provider.       Diabetes self management actions:  Glucose monitoring per provider recommendations  Perform Quality checks on blood meter  Eat Healthy  Check feet daily  Visit provider every 3-6 months as directed  Hbg A1C level every 3-6 months.  Eye Exam yearly    . Client will verbalize knowledge of self management of Hypertension as evidences by BP reading of 140/90 or less; or as defined by provider      . Exercise 3x per week (30 min per time) prior goal       Agrees to go to go to the [facility] and start water aerobic tiw Agrees to watch carbs; last A1c 6.8  Will monitor portions    . Maintain timely refills of Heart Failure medication as prescribed within the year       . Obtain annual  Lipid Profile, LDL-C      . paient -prior goal       Stay joyful and healthy    . Patient Stated-prior goal       Increase my physical activity by doing chair exercises, walk more.     . Visit Primary Care Provider or Cardiologist at least 2 times per year         Plan:  Send successful outreach letter with  a copy of their individualized care plan, Send individual care plan to provider and Send educational material. Chronic care management coordination will outreach in: 6 months. RNCM will update [facility] pharmacist/pharmacy Tech.    Thea Silversmith, RN, MSN, [place] [phone]

## 2018-07-22 ENCOUNTER — Other Ambulatory Visit: Payer: Self-pay | Admitting: Pharmacy Technician

## 2018-07-22 NOTE — Patient Outreach (Signed)
[place]) Care Management  07/22/2018  Jessica [name] May 17, 1933 [id number]    Follow up call placed to [employer]  regarding patient assistance application(s) for Jessica [name] states patient has been temporarily ap[proved as of 4/13 until 7/12. LIS denial letter will need to be submitted to extend approval date.   Follow up:  Informed THN RPh Lottie Dawson of information. Will submit LIS denial letter to [employer] once received.  [name] [place] Certified Pharmacy Technician [place] Management Direct [phone]

## 2018-07-23 ENCOUNTER — Other Ambulatory Visit: Payer: Self-pay | Admitting: Pharmacist

## 2018-07-23 ENCOUNTER — Ambulatory Visit: Payer: Self-pay

## 2018-07-23 ENCOUNTER — Ambulatory Visit: Payer: HMO

## 2018-07-23 NOTE — Patient Outreach (Signed)
[facility]) Care Management  [place]  07/23/2018  ASA FATH December 09, 1933 [id number]  Successful outreach to Ms. [place] with HIPAA identifiers verified x2.  Patient is doing well and has no concerns.   [employer] to ship 3 months worth of medication to patient.  Notified patient that [employer] would be calling to set up delivery for [facility] (patient has been approved).  She must accept call in order to receive shipment.  Patient has found her LIS/extra help denial letter and has it waiting for me to retrieve once COVID-19 restrictions are lifted.  Patient is grateful of [place] efforts.  PLAN: -I will f/u with patient regarding LIS denial letter next month as COVID-19 restrictions are lifted -Will continue quarterly HTA CSNP outreach    [name], PharmD, [place]  [phone]

## 2018-07-25 ENCOUNTER — Telehealth: Payer: Self-pay | Admitting: Internal Medicine

## 2018-07-25 NOTE — Telephone Encounter (Signed)
Copied from [place] [id number]. Topic: Quick Communication - Rx Refill/Question >> Jul 25, 2018 11:52 AM Celene Kras A wrote: Medication: allopurinol (ZYLOPRIM) 100 MG tablet, levothyroxine (SYNTHROID, LEVOTHROID) 75 MCG tablet, metFORMIN (GLUCOPHAGE-XR) 750 MG 24 hr tablet      Has the patient contacted their pharmacy? Yes.   (Agent: If no, request that the patient contact the pharmacy for the refill.) (Agent: If yes, when and what did the pharmacy advise?)  Preferred Pharmacy (with phone number or street name): [facility] ([place], [place] [address] Phone: [phone] Fax: [phone] Not a 24 hour pharmacy; exact hours not known.    Agent: Please be advised that RX refills may take up to 3 business days. We ask that you follow-up with your pharmacy.

## 2018-07-26 MED ORDER — LEVOTHYROXINE SODIUM 75 MCG PO TABS: 75.0000 ug | ORAL_TABLET | Freq: Every day | ORAL | 3 refills | Status: DC

## 2018-07-26 MED ORDER — METFORMIN HCL ER 750 MG PO TB24: 750.0000 mg | ORAL_TABLET | Freq: Every day | ORAL | 3 refills | Status: DC

## 2018-07-26 MED ORDER — ALLOPURINOL 100 MG PO TABS: 100.0000 mg | ORAL_TABLET | Freq: Every day | ORAL | 3 refills | Status: DC

## 2018-07-26 NOTE — Telephone Encounter (Signed)
RXs sent.

## 2018-08-08 ENCOUNTER — Other Ambulatory Visit: Payer: Self-pay | Admitting: Internal Medicine

## 2018-08-14 ENCOUNTER — Encounter: Payer: Self-pay | Admitting: Gastroenterology

## 2018-08-15 ENCOUNTER — Encounter: Payer: Self-pay | Admitting: Pharmacist

## 2018-08-15 NOTE — Telephone Encounter (Signed)
This encounter was created in error - please disregard.

## 2018-08-19 ENCOUNTER — Telehealth: Payer: Self-pay | Admitting: Internal Medicine

## 2018-08-19 NOTE — Telephone Encounter (Signed)
Copied from [place] [phone]. Topic: Quick Communication - Rx Refill/Question >> Aug 19, 2018 10:12 AM Mathis Bud wrote: Medication: furosemide (LASIX) 20 MG tablet  venlafaxine XR (EFFEXOR-XR) 75 MG   Has the patient contacted their pharmacy? Yes, no more refills.  Preferred Pharmacy (with phone number or street name): [facility]) - [place], [place] [phone] (Phone) [phone] (Fax)    Agent: Please be advised that RX refills may take up to 3 business days. We ask that you follow-up with your pharmacy.

## 2018-08-20 MED ORDER — VENLAFAXINE HCL ER 75 MG PO CP24: ORAL_CAPSULE | ORAL | 2 refills | Status: DC

## 2018-08-20 MED ORDER — FUROSEMIDE 20 MG PO TABS: 40.0000 mg | ORAL_TABLET | Freq: Every day | ORAL | 1 refills | Status: DC

## 2018-08-20 NOTE — Telephone Encounter (Signed)
RX sent

## 2018-08-22 ENCOUNTER — Ambulatory Visit: Payer: HMO | Admitting: Pharmacist

## 2018-08-28 ENCOUNTER — Other Ambulatory Visit: Payer: Self-pay | Admitting: Pharmacist

## 2018-08-28 ENCOUNTER — Ambulatory Visit: Payer: Self-pay | Admitting: Pharmacist

## 2018-08-28 NOTE — Patient Outreach (Signed)
[place] [facility]) Care Management [place]  08/28/2018  TAKIA RUNYON 21-Dec-1933 [id number]  Reason for referral: Medication Assistance with Koren Shiver & DM meds  Referral source: [employer] [place] Current insurance:[employer]  PMHx includes but not limited to:  T2DM (A1c 7.3), Afib, HTN, CHF, GERD, Hypothyroidism.  Outreach:  Successful outreach with Ms. Osmer.  HIPAA identifiers verified.  Patient is doing well today.  She states her FBG was last 115.  She only checks FBG twice weekly.  She denies hypoglycemia.  She denies SOB/wheezing/etc.  She reports 100% compliance with medications.  She denies adverse effects.  Encouraged patient to continuing checking FBG.  Counseled patient on importance of medication adherence.  Patient verbalizes understanding.  Labs reviewed.  Last A1c was 7.3 on 06/03/18.  Medication list reviewed and updated in Epic.  Patient is almost out of Jardiance 22-month shipment from [place].  Extra help/Denial letter obtained form patient.  I will route this to [facility] CPhT, Etter Sjogren for assistance.   PLAN: -I will follow up with patient next month.  [name], PharmD, [place]  [phone]

## 2018-09-03 ENCOUNTER — Other Ambulatory Visit (INDEPENDENT_AMBULATORY_CARE_PROVIDER_SITE_OTHER): Payer: HMO

## 2018-09-03 ENCOUNTER — Encounter: Payer: Self-pay | Admitting: Internal Medicine

## 2018-09-03 ENCOUNTER — Other Ambulatory Visit: Payer: Self-pay | Admitting: Pharmacist

## 2018-09-03 ENCOUNTER — Other Ambulatory Visit: Payer: Self-pay

## 2018-09-03 ENCOUNTER — Ambulatory Visit (INDEPENDENT_AMBULATORY_CARE_PROVIDER_SITE_OTHER): Payer: HMO | Admitting: Internal Medicine

## 2018-09-03 VITALS — BP 126/82 | HR 80 | Temp 98.2°F | Ht 67.0 in | Wt 247.0 lb

## 2018-09-03 DIAGNOSIS — E118 Type 2 diabetes mellitus with unspecified complications: Secondary | ICD-10-CM

## 2018-09-03 DIAGNOSIS — I5043 Acute on chronic combined systolic (congestive) and diastolic (congestive) heart failure: Secondary | ICD-10-CM

## 2018-09-03 DIAGNOSIS — G8929 Other chronic pain: Secondary | ICD-10-CM | POA: Diagnosis not present

## 2018-09-03 DIAGNOSIS — M545 Low back pain, unspecified: Secondary | ICD-10-CM

## 2018-09-03 DIAGNOSIS — F419 Anxiety disorder, unspecified: Secondary | ICD-10-CM

## 2018-09-03 DIAGNOSIS — I10 Essential (primary) hypertension: Secondary | ICD-10-CM | POA: Diagnosis not present

## 2018-09-03 LAB — BASIC METABOLIC PANEL
BUN: 26 mg/dL — ABNORMAL HIGH (ref 6–23)
CO2: 24 mEq/L (ref 19–32)
Calcium: 10.4 mg/dL (ref 8.4–10.5)
Chloride: 102 mEq/L (ref 96–112)
Creatinine, Ser: 0.91 mg/dL (ref 0.40–1.20)
GFR: 59.04 mL/min — ABNORMAL LOW (ref >60.00)
Glucose, Bld: 123 mg/dL — ABNORMAL HIGH (ref 70–99)
Potassium: 3.8 mEq/L (ref 3.5–5.1)
Sodium: 137 mEq/L (ref 135–145)

## 2018-09-03 LAB — HEMOGLOBIN A1C: Hgb A1c MFr Bld: 7.5 % — ABNORMAL HIGH (ref 4.6–6.5)

## 2018-09-03 MED ORDER — VENLAFAXINE HCL ER 75 MG PO CP24: ORAL_CAPSULE | ORAL | 3 refills | Status: DC

## 2018-09-03 NOTE — Assessment & Plan Note (Signed)
Ozempic, Wilder Glade

## 2018-09-03 NOTE — Assessment & Plan Note (Signed)
Norco prn  Potential benefits of a long term opioids use as well as potential risks (i.e. addiction risk, apnea etc) and complications (i.e. Somnolence, constipation and others) were explained to the patient and were aknowledged. Norco from a different manufacturer caused nausea. C/o LBP:

## 2018-09-03 NOTE — Assessment & Plan Note (Signed)
Triamt/HCTZ, Losartan, Delene Loll, Jardiance

## 2018-09-03 NOTE — Progress Notes (Signed)
Subjective:  Patient ID: Jessica [name], female    DOB: 1933-05-08  Age: 85 y.o. MRN: [id number]  CC: No chief complaint on file.   HPI Jessica [name] presents for HTN, CAD, A fib f/u  Outpatient Medications Prior to Visit  Medication Sig Dispense Refill  . acetaminophen (TYLENOL) 325 MG tablet Take 2 tablets (650 mg total) by mouth every 6 (six) hours as needed for mild pain (or Fever >/= 101).    [name] allopurinol (ZYLOPRIM) 100 MG tablet Take 1 tablet (100 mg total) by mouth daily. 90 tablet 3  . apixaban (ELIQUIS) 5 MG TABS tablet Take 1 tablet (5 mg total) by mouth 2 (two) times daily. 180 tablet 3  . b complex vitamins tablet Take 1 tablet by mouth daily.    . Blood Glucose Monitoring Suppl (TRUE METRIX AIR GLUCOSE METER) w/Device KIT CHECK BLOOD SUGAR TWICE DAILY 1 kit 0  . Cholecalciferol 1000 UNITS tablet Take 1,000 Units by mouth daily.      . empagliflozin (JARDIANCE) 10 MG TABS tablet Take 10 mg by mouth daily.    [name] ENTRESTO 24-26 MG TAKE 1 TABLET BY MOUTH TWICE A DAY 60 tablet 5  . furosemide (LASIX) 20 MG tablet Take 2 tablets (40 mg total) by mouth daily. 180 tablet 1  . KLOR-CON M20 20 MEQ tablet TAKE 1 TABLET BY MOUTH EVERY DAY 90 tablet 3  . levothyroxine (SYNTHROID) 75 MCG tablet Take 1 tablet (75 mcg total) by mouth daily. 90 tablet 3  . LORazepam (ATIVAN) 0.5 MG tablet TAKE 1 TABLET (0.5 MG TOTAL) BY MOUTH 2 (TWO) TIMES DAILY. 180 tablet 1  . losartan (COZAAR) 100 MG tablet TAKE 1 TABLET EVERY DAY 90 tablet 3  . metFORMIN (GLUCOPHAGE-XR) 750 MG 24 hr tablet Take 1 tablet (750 mg total) by mouth daily with breakfast. 90 tablet 3  . metoprolol succinate (TOPROL-XL) 50 MG 24 hr tablet TAKE 1 TABLET (50 MG TOTAL) BY MOUTH DAILY. TAKE WITH OR IMMEDIATELY FOLLOWING A MEAL. 90 tablet 1  . Multiple Vitamins-Minerals (OCUVITE PRESERVISION) TABS Take 1 tablet by mouth 2 (two) times daily.    . multivitamin-iron-minerals-folic acid (CENTRUM) chewable tablet Chew 1 tablet by  mouth daily.    [name] omeprazole (PRILOSEC) 40 MG capsule TAKE 1 CAPSULE EVERY DAY 90 capsule 3  . temazepam (RESTORIL) 15 MG capsule Take 1 capsule (15 mg total) by mouth at bedtime as needed for sleep. 90 capsule 1  . tetrahydrozoline 0.05 % ophthalmic solution Place 1 drop into both eyes daily.    . TRUE METRIX BLOOD GLUCOSE TEST test strip CHECK BLOOD SUGAR TWICE DAILY 200 each 1  . TRUEPLUS LANCETS 33G MISC CHECK BLOOD SUGARS TWICE A DAY 200 each 1  . venlafaxine XR (EFFEXOR-XR) 75 MG 24 hr capsule TAKE 1 CAPSULE EVERY DAY WITH BREAKFAST 90 capsule 2   No facility-administered medications prior to visit.     ROS: Review of Systems  Constitutional: Positive for fatigue. Negative for activity change, appetite change, chills and unexpected weight change.  HENT: Negative for congestion, mouth sores and sinus pressure.   Eyes: Negative for visual disturbance.  Respiratory: Negative for cough and chest tightness.   Gastrointestinal: Negative for abdominal pain and nausea.  Genitourinary: Negative for difficulty urinating, frequency and vaginal pain.  Musculoskeletal: Positive for arthralgias. Negative for back pain and gait problem.  Skin: Negative for pallor and rash.  Neurological: Negative for dizziness, tremors, weakness, numbness and headaches.  Psychiatric/Behavioral: Positive for  sleep disturbance. Negative for confusion and suicidal ideas. The patient is nervous/anxious.     Objective:  BP 126/82 (BP Location: Left Arm, Patient Position: Sitting, Cuff Size: Large)   Pulse 80   Temp 98.2 F (36.8 C) (Oral)   Ht 5' 7" (1.702 m)   Wt 247 lb (112 kg)   SpO2 96%   BMI 38.69 kg/m   BP Readings from Last 3 Encounters:  09/03/18 126/82  06/03/18 112/76  03/11/18 112/78    Wt Readings from Last 3 Encounters:  09/03/18 247 lb (112 kg)  06/03/18 239 lb (108.4 kg)  03/11/18 246 lb 3.2 oz (111.7 kg)    Physical Exam Constitutional:      General: She is not in acute distress.     Appearance: She is well-developed.  HENT:     Head: Normocephalic.     Right Ear: External ear normal.     Left Ear: External ear normal.     Nose: Nose normal.  Eyes:     General:        Right eye: No discharge.        Left eye: No discharge.     Conjunctiva/sclera: Conjunctivae normal.     Pupils: Pupils are equal, round, and reactive to light.  Neck:     Musculoskeletal: Normal range of motion and neck supple.     Thyroid: No thyromegaly.     Vascular: No JVD.     Trachea: No tracheal deviation.  Cardiovascular:     Rate and Rhythm: Normal rate. Rhythm irregular.     Heart sounds: Normal heart sounds.  Pulmonary:     Effort: No respiratory distress.     Breath sounds: No stridor. No wheezing.  Abdominal:     General: Bowel sounds are normal. There is no distension.     Palpations: Abdomen is soft. There is no mass.     Tenderness: There is no abdominal tenderness. There is no guarding or rebound.  Musculoskeletal:        General: No tenderness.  Lymphadenopathy:     Cervical: No cervical adenopathy.  Skin:    Findings: No erythema or rash.  Neurological:     Mental Status: She is oriented to person, place, and time.     Cranial Nerves: No cranial nerve deficit.     Motor: No abnormal muscle tone.     Coordination: Coordination normal.     Deep Tendon Reflexes: Reflexes normal.  Psychiatric:        Behavior: Behavior normal.        Thought Content: Thought content normal.        Judgment: Judgment normal.   obese tearful  Lab Results  Component Value Date   WBC 7.5 06/26/2017   HGB 13.2 06/26/2017   HCT 40.2 06/26/2017   PLT 360 06/26/2017   GLUCOSE 116 (H) 06/03/2018   CHOL 191 02/19/2017   TRIG 183.0 (H) 02/19/2017   HDL 38.90 (L) 02/19/2017   LDLDIRECT 155.9 09/30/2012   LDLCALC 116 (H) 02/19/2017   ALT 15 06/22/2017   AST 24 06/22/2017   NA 136 06/03/2018   K 4.2 06/03/2018   CL 99 06/03/2018   CREATININE 1.03 06/03/2018   BUN 26 (H) 06/03/2018    CO2 26 06/03/2018   TSH 2.771 06/22/2017   INR 1.68 06/23/2017   HGBA1C 7.3 (H) 06/03/2018   MICROALBUR 4.5 (H) 12/02/2009    Dexascan  Result Date: 01/06/2018 Date of study: 01/04/18 Exam: DUAL  X-RAY ABSORPTIOMETRY (DXA) FOR BONE MINERAL DENSITY (BMD) Instrument: [employer] Provider: PCP Indication: screening for osteoporosis Comparison: none (please note that it is not possible to compare data from different instruments) Clinical data: Pt is a 85 y.o. female with previous fractures. Results:  Lumbar spine L1-L4 Femoral neck (FN) 33% distal radius T-score 4.9 RFN: 2.5 LFN: 1.4 n/a Change in BMD from previous DXA test (%) n/a n/a n/a (*) statistically significant Assessment: the BMD is normal according to the [facility] classification for osteoporosis (see below). Fracture risk: low FRAX score: not calculated due to normal BMD Comments: the technical quality of the study is good  Degenerative change may falsely elevate spine score. Recommend optimizing calcium (1200 mg/day) and vitamin D (800 IU/day) intake. No pharmacological treatment is indicated. Followup: Repeat BMD is appropriate after 2 years. WHO criteria for diagnosis of osteoporosis in postmenopausal women and in men 59 y/o or older: - normal: T-score -1.0 to + 1.0 - osteopenia/low bone density: T-score between -2.5 and -1.0 - osteoporosis: T-score below -2.5 - severe osteoporosis: T-score below -2.5 with history of fragility fracture Note: although not part of the WHO classification, the presence of a fragility fracture, regardless of the T-score, should be considered diagnostic of osteoporosis, provided other causes for the fracture have been excluded. Loura Pardon MD    Assessment & Plan:   Diagnoses and all orders for this visit:  Essential hypertension  DM type 2, controlled, with complication (HCC)  Anxiety state  Chronic bilateral low back pain without sciatica     No orders of the defined types were placed  in this encounter.    Follow-up: No follow-ups on file.  Walker Kehr, MD

## 2018-09-03 NOTE — Assessment & Plan Note (Signed)
Triamt/HCTZ, Losartan 

## 2018-09-03 NOTE — Assessment & Plan Note (Addendum)
Temazepam prn  Potential benefits of a long term prn benzodiazepines  use as well as potential risks  and complications were explained to the patient and were aknowledged. 08/2018 off Effexor - pt stopped; pt wants to re-start

## 2018-09-05 NOTE — Patient Outreach (Signed)
[place] [facility]) Care Management [place]  09/05/2018  Jessica [name] Mar 05, 1934 [id number]  Reason for referral: medication management-DM (HTA C-SNP)  Successful call to Jessica [name] with HIPAA identifiers verified.  Patient states she had a PCP appt today.  She said things went well.  She has chosen to restart Effexor after stopping medication in May.  Discussed the need to notify PCP before abruptly stopping long term medication in the future.  Patient's A1c went from 7.3-->7.5.  Discussed compliance with medications and diet.  Patient verbalizes understanding and states she is trying.  Medication assistance has made compliance with medications easier per patient.    Re-applied for Extra help/LIS due to patient assistance program requirements from [employer] ([place]) and [employer] [place]).    PLAN: -I will follow up with patient next month  [name], PharmD, [place]  [phone]

## 2018-09-17 ENCOUNTER — Ambulatory Visit: Payer: Self-pay | Admitting: Pharmacist

## 2018-09-19 ENCOUNTER — Ambulatory Visit: Payer: Self-pay | Admitting: Pharmacist

## 2018-09-23 ENCOUNTER — Ambulatory Visit: Payer: Self-pay | Admitting: Pharmacist

## 2018-09-30 ENCOUNTER — Ambulatory Visit: Payer: Self-pay | Admitting: Pharmacist

## 2018-10-03 ENCOUNTER — Encounter: Payer: HMO | Admitting: *Deleted

## 2018-10-03 ENCOUNTER — Ambulatory Visit: Payer: Self-pay | Admitting: Pharmacist

## 2018-10-03 ENCOUNTER — Telehealth: Payer: Self-pay

## 2018-10-03 NOTE — Telephone Encounter (Signed)
Left message for patient to remind of missed remote transmission.  

## 2018-10-08 ENCOUNTER — Ambulatory Visit: Payer: Self-pay | Admitting: Pharmacist

## 2018-10-08 ENCOUNTER — Other Ambulatory Visit: Payer: Self-pay | Admitting: Pharmacist

## 2018-10-08 NOTE — Patient Outreach (Signed)
[place]) Care Management  [name]  10/08/2018  Jessica [name] 1933/06/25 [id number]   Reason for referral: Medication Assistance  Reason for call: LIS denial letter  Outreach:  Unsuccessful telephone call attempt  to patient.   HIPAA compliant voicemail left requesting a return call  Plan:  -I will make another outreach attempt to patient within 3-4 business days.    [name], PharmD, [place]  [phone]

## 2018-10-11 ENCOUNTER — Encounter: Payer: Self-pay | Admitting: Cardiology

## 2018-10-11 ENCOUNTER — Ambulatory Visit: Payer: Self-pay | Admitting: Pharmacist

## 2018-10-14 ENCOUNTER — Other Ambulatory Visit: Payer: Self-pay | Admitting: Pharmacist

## 2018-10-14 ENCOUNTER — Telehealth: Payer: Self-pay | Admitting: Internal Medicine

## 2018-10-14 ENCOUNTER — Other Ambulatory Visit: Payer: Self-pay | Admitting: Pharmacy Technician

## 2018-10-14 NOTE — Patient Outreach (Signed)
[place] [facility]) Care Management  10/14/2018  Jessica [name] 08-29-1933 [id number]   -Received patients LIS denial letter -Faxed document in B-I  -Will follow up with company in 5-7 business days to verify [employer] extended  [employer]. [name] Certified Pharmacy Technician [place] Management Direct [phone]

## 2018-10-14 NOTE — Patient Outreach (Signed)
[place] [facility]) Care Management [place]  10/14/2018  KIELA SHISLER [birth year] [id number]  Reason for referral: medication management & assistance  Successful incoming call from Ms. Brubacher with HIPAA identifiers verified x2.  Patient states she is doing well today.  Her blood sugars have been below 130 in the AM.  She denies hypoglycemia (BG<70).  She is tolerating Jardiance well with no side effects.  She denies yeast infections.  She states her metformin was recalled.  Encouraged patient to call PCP if pharmacy was unable to get new manufacturers in that were not affected by recall.  Patient verbalizes understanding.  Information was successfully routed to [place], Etter Sjogren to complete medication assistance for Jardiance through [employer].  PLAN: -Will plan for telephonic outreach in 2 months.  Encouraged patient to call if issues arise.  [name], PharmD, [place]  [phone]

## 2018-10-14 NOTE — Telephone Encounter (Signed)
Pt called stating she had gotten letter in the mail stating the metformin she is taking was recalled. Pt is requesting an alternate medication. Please advise.   [facility]) - [place], [place]  [place] [address]  Phone: [phone] Fax: [phone]  Not a 24 hour pharmacy; exact hours not known.

## 2018-10-15 MED ORDER — SYNJARDY XR 10-1000 MG PO TB24: 1.0000 | ORAL_TABLET | Freq: Every day | ORAL | 3 refills | Status: DC

## 2018-10-15 NOTE — Telephone Encounter (Signed)
Discontinue metformin XR and Jardiance.  Start Synjardy XR are 01/1000 daily.  Pick up samples-I emailed a new prescription to the mail order drugstore Thanks

## 2018-10-15 NOTE — Telephone Encounter (Signed)
Please advise 

## 2018-10-17 NOTE — Telephone Encounter (Signed)
Unable to reach pt on home phone and VM full on cell.

## 2018-10-18 ENCOUNTER — Other Ambulatory Visit: Payer: Self-pay | Admitting: Pharmacy Technician

## 2018-10-18 ENCOUNTER — Other Ambulatory Visit: Payer: Self-pay | Admitting: Pharmacist

## 2018-10-18 NOTE — Patient Outreach (Signed)
[place] [facility]) Care Management [place]  10/18/2018  Jessica [name] Oct 16, 1933 [id number]  Successful care coordination call to Jessica [name] with HIPAA identifiers verified.  Patient had called to leave me a voicemail stating that her Eliquis has become too expensive at $300/month.  She now believes she has met the out of pocket expense required by Jessica [name].  Upon review of patient's labs and comorbid conditions, Eliquis remains appropriate.  Eliquis is currently prescribed by Jessica [name] (cardiologist). Will route message to [facility] CPhT, Jessica [name] who will assist with this process.  PLAN: -I will follow up with patient next week.  Jessica [name], PharmD, [place]  [phone]

## 2018-10-18 NOTE — Patient Outreach (Signed)
[place] [facility]) Care Management  10/18/2018  Jessica [name] 1934-02-22 [id number]                          Medication Assistance Referral  Referral From: [facility] RPh Jenne Pane  Medication/Company: Eliquis / [place] Patient application portion:  N/A [name] to obtain Provider application portion: Faxed  to Dr. Oval Linsey   Follow up:  Will submit to company once all documents have been received.  [name] [place] Certified Pharmacy Technician [place] Management Direct Dial:[phone]

## 2018-10-23 ENCOUNTER — Other Ambulatory Visit: Payer: Self-pay | Admitting: Pharmacist

## 2018-10-24 ENCOUNTER — Other Ambulatory Visit: Payer: Self-pay | Admitting: Pharmacy Technician

## 2018-10-24 NOTE — Patient Outreach (Signed)
[place] [facility]) Care Management [place]  10/24/2018  Jessica [name] 1933-11-19 [id number]  Successful care coordination call to Ms. Jessica [name] with HIPAA identifiers verified.  Patient's Eliquis has become too expensive at $300/month.  She has met 3% out of pocket expense required by Jessica [name].  Upon review of patient's labs and comorbid conditions, Eliquis remains appropriate.  Eliquis is currently prescribed by Dr. Thompson [name] (cardiologist). Application from [employer] for Eliquis has been completed by patient and routed to [place], Jessica [name].    Patient states her blood sugar has been within goal range.  Her FBG remain <130.  She denies hypoglycemia.  She reports 100% compliance with medications.  Patient states she could not have remained compliant with medications without [place] assistance.  PLAN: --I will follow up with HTA CSNP patient next month   Jessica [name], PharmD, [place]  [phone]

## 2018-10-24 NOTE — Patient Outreach (Signed)
[facility]) Care Management  10/24/2018  Jessica [name] 10/12/1933 [id number]    Follow up call placed to B-I regarding patient assistance application(s) for Jardiance , April confirms patients approval has been extended until 04/03/19. Shipment of medication was mailed out on 7/20. Medication to arrive at patients home in 10-14 business days.  Follow up:  Will route note to [place] to inform.  [name] [place] Certified Pharmacy Technician [place] Management Direct [phone]

## 2018-11-02 ENCOUNTER — Other Ambulatory Visit: Payer: Self-pay | Admitting: Internal Medicine

## 2018-11-04 ENCOUNTER — Other Ambulatory Visit: Payer: Self-pay | Admitting: Internal Medicine

## 2018-11-13 ENCOUNTER — Other Ambulatory Visit: Payer: Self-pay | Admitting: *Deleted

## 2018-11-13 MED ORDER — LOSARTAN POTASSIUM 100 MG PO TABS: 100.0000 mg | ORAL_TABLET | Freq: Every day | ORAL | 3 refills | Status: DC

## 2018-11-19 ENCOUNTER — Other Ambulatory Visit: Payer: Self-pay | Admitting: Pharmacy Technician

## 2018-11-19 NOTE — Patient Outreach (Signed)
[place] [facility]) Care Management  11/19/2018  Jessica [name] 03-01-1934 [id number]   Received provider portion(s) of patient assistance application for Eliquis. Faxed completed application and required documents into BMS.  Will follow up with company in 5-7 business days to check status of application.  [name] [place] Certified Pharmacy Technician [place] Management Direct Dial:[phone]

## 2018-11-21 ENCOUNTER — Other Ambulatory Visit: Payer: Self-pay | Admitting: Pharmacy Technician

## 2018-11-21 ENCOUNTER — Other Ambulatory Visit: Payer: Self-pay | Admitting: Internal Medicine

## 2018-11-21 MED ORDER — LOSARTAN POTASSIUM 100 MG PO TABS: 100.0000 mg | ORAL_TABLET | Freq: Every day | ORAL | 0 refills | Status: DC

## 2018-11-21 NOTE — Telephone Encounter (Signed)
Pt called stating the mail order pharmacy is sending her medication within the next 24-48 hours. Pt is requesting to have a small supply sent to the pharmacy because she is completely out. Please advise.   [id number] - [place], [place]  [address] [place] [address]  Phone: [phone] Fax: [phone]  Open 24 hours

## 2018-11-21 NOTE — Patient Outreach (Signed)
Jessica [name]) Care Management  11/21/2018  Jessica [name] 1933-04-20 [id number]    Follow up call placed to BMS regarding patient assistance application(s) for Eliquis , [name] confirms patient has been approved as of 8/18 until 04/03/19.    Follow up call placed to [facility] regarding patient assistance shipping details for Eliquis, [name] states that 3 month supply of medication will be delivered via UPS by end of 8/20.   Follow up:  Will follow up with patient in 2-3 business days to confirm medication has been received.  [name] [place] Certified Pharmacy Technician [place] Management Direct [phone]

## 2018-11-26 ENCOUNTER — Ambulatory Visit: Payer: Self-pay | Admitting: Pharmacist

## 2018-11-26 ENCOUNTER — Other Ambulatory Visit: Payer: Self-pay | Admitting: Pharmacist

## 2018-11-28 NOTE — Patient Outreach (Signed)
[place] [facility]) Care Management [place]  11/28/2018  Jessica [name] 09-04-1933 [id number]  Reason for referral: medication management (HTA CSNP)  Successful outreach with Ms. Severa with HIPAA identifiers verified.  Patient states she has received all medications from patient assistance programs including: Ferne Coe and Eliquis.  She remains compliant with medications and denies adverse events.  She continues to check blood sugar daily as directed and reports FBG<130.  She has been able to remain compliant due to medication assistance support from [place].  Encouraged patient to continue the great work. Encouraged patient to call if needs arise.  PLAN: -I will reach out to patient next quarter  [name], PharmD, [place]  [phone]

## 2018-12-03 ENCOUNTER — Telehealth: Payer: Self-pay | Admitting: Internal Medicine

## 2018-12-03 NOTE — Telephone Encounter (Signed)
Please advise 

## 2018-12-03 NOTE — Telephone Encounter (Signed)
Medication:  furosemide (LASIX) 20 MG tablet    furosemide (LASIX) 40 MG tablet    Pharmacist called to get clarification re: which dosage pt should be taking at this time. Please call Jessica [name] back directly: [phone] *secure voicemail/may leave msg*  Preferred Pharmacy:  [facility] - [place], [place] [phone] (Phone) [phone] (Fax)

## 2018-12-04 ENCOUNTER — Encounter: Payer: Self-pay | Admitting: Internal Medicine

## 2018-12-04 ENCOUNTER — Telehealth: Payer: Self-pay | Admitting: Internal Medicine

## 2018-12-04 ENCOUNTER — Ambulatory Visit (INDEPENDENT_AMBULATORY_CARE_PROVIDER_SITE_OTHER): Payer: HMO | Admitting: Internal Medicine

## 2018-12-04 ENCOUNTER — Other Ambulatory Visit: Payer: Self-pay

## 2018-12-04 VITALS — BP 120/80 | HR 86 | Temp 98.1°F | Ht 67.0 in | Wt 245.0 lb

## 2018-12-04 DIAGNOSIS — I4892 Unspecified atrial flutter: Secondary | ICD-10-CM | POA: Diagnosis not present

## 2018-12-04 DIAGNOSIS — I4891 Unspecified atrial fibrillation: Secondary | ICD-10-CM | POA: Diagnosis not present

## 2018-12-04 DIAGNOSIS — E118 Type 2 diabetes mellitus with unspecified complications: Secondary | ICD-10-CM

## 2018-12-04 DIAGNOSIS — I5043 Acute on chronic combined systolic (congestive) and diastolic (congestive) heart failure: Secondary | ICD-10-CM | POA: Diagnosis not present

## 2018-12-04 DIAGNOSIS — Z23 Encounter for immunization: Secondary | ICD-10-CM

## 2018-12-04 MED ORDER — METFORMIN HCL 500 MG PO TABS: 500.0000 mg | ORAL_TABLET | Freq: Two times a day (BID) | ORAL | 3 refills | Status: DC

## 2018-12-04 MED ORDER — FUROSEMIDE 40 MG PO TABS: 40.0000 mg | ORAL_TABLET | Freq: Two times a day (BID) | ORAL | 3 refills | Status: DC

## 2018-12-04 NOTE — Assessment & Plan Note (Signed)
Triamt/HCTZ, Losartan, Entresto, Jardiance 

## 2018-12-04 NOTE — Assessment & Plan Note (Signed)
D/c Metformin ER Metformin

## 2018-12-04 NOTE — Progress Notes (Signed)
Subjective:  Patient ID: Jessica [name], female    DOB: 03/20/1934  Age: 85 y.o. MRN: [id number]  CC: No chief complaint on file.   HPI JOE TANNEY presents for CHF, gout, depression f/u  Outpatient Medications Prior to Visit  Medication Sig Dispense Refill  . acetaminophen (TYLENOL) 325 MG tablet Take 2 tablets (650 mg total) by mouth every 6 (six) hours as needed for mild pain (or Fever >/= 101).    [name] allopurinol (ZYLOPRIM) 100 MG tablet Take 1 tablet (100 mg total) by mouth daily. 90 tablet 3  . apixaban (ELIQUIS) 5 MG TABS tablet Take 1 tablet (5 mg total) by mouth 2 (two) times daily. 180 tablet 3  . b complex vitamins tablet Take 1 tablet by mouth daily.    . Blood Glucose Monitoring Suppl (TRUE METRIX AIR GLUCOSE METER) w/Device KIT CHECK BLOOD SUGAR TWICE DAILY 1 kit 0  . Cholecalciferol 1000 UNITS tablet Take 1,000 Units by mouth daily.      . Empagliflozin-metFORMIN HCl ER (SYNJARDY XR) 01-999 MG TB24 Take 1 tablet by mouth daily with breakfast. 90 tablet 3  . ENTRESTO 24-26 MG TAKE 1 TABLET BY MOUTH TWICE A DAY 60 tablet 5  . furosemide (LASIX) 20 MG tablet Take 2 tablets (40 mg total) by mouth daily. 180 tablet 1  . furosemide (LASIX) 40 MG tablet TAKE 1 TABLET BY MOUTH TWICE A DAY 60 tablet 11  . KLOR-CON M20 20 MEQ tablet TAKE 1 TABLET BY MOUTH EVERY DAY 90 tablet 3  . levothyroxine (SYNTHROID) 75 MCG tablet Take 1 tablet (75 mcg total) by mouth daily. 90 tablet 3  . LORazepam (ATIVAN) 0.5 MG tablet TAKE 1 TABLET (0.5 MG TOTAL) BY MOUTH 2 (TWO) TIMES DAILY. 180 tablet 1  . losartan (COZAAR) 100 MG tablet Take 1 tablet (100 mg total) by mouth daily. 90 tablet 3  . losartan (COZAAR) 100 MG tablet Take 1 tablet (100 mg total) by mouth daily. 14 tablet 0  . metoprolol succinate (TOPROL-XL) 50 MG 24 hr tablet TAKE 1 TABLET (50 MG TOTAL) BY MOUTH DAILY. TAKE WITH OR IMMEDIATELY FOLLOWING A MEAL. 90 tablet 1  . Multiple Vitamins-Minerals (OCUVITE PRESERVISION) TABS  Take 1 tablet by mouth 2 (two) times daily.    . multivitamin-iron-minerals-folic acid (CENTRUM) chewable tablet Chew 1 tablet by mouth daily.    [name] omeprazole (PRILOSEC) 40 MG capsule TAKE 1 CAPSULE EVERY DAY 90 capsule 3  . temazepam (RESTORIL) 15 MG capsule Take 1 capsule (15 mg total) by mouth at bedtime as needed for sleep. 90 capsule 1  . tetrahydrozoline 0.05 % ophthalmic solution Place 1 drop into both eyes daily.    . TRUE METRIX BLOOD GLUCOSE TEST test strip CHECK BLOOD SUGAR TWICE DAILY 200 each 1  . TRUEPLUS LANCETS 33G MISC CHECK BLOOD SUGARS TWICE A DAY 200 each 1  . venlafaxine XR (EFFEXOR-XR) 75 MG 24 hr capsule TAKE 1 CAPSULE EVERY DAY WITH BREAKFAST 90 capsule 3   No facility-administered medications prior to visit.     ROS: Review of Systems  Constitutional: Positive for fatigue. Negative for activity change, appetite change, chills and unexpected weight change.  HENT: Negative for congestion, mouth sores and sinus pressure.   Eyes: Negative for visual disturbance.  Respiratory: Negative for cough and chest tightness.   Gastrointestinal: Negative for abdominal pain and nausea.  Genitourinary: Negative for difficulty urinating, frequency and vaginal pain.  Musculoskeletal: Positive for arthralgias and back pain. Negative for gait  problem.  Skin: Negative for pallor and rash.  Neurological: Negative for dizziness, tremors, weakness, numbness and headaches.  Psychiatric/Behavioral: Positive for dysphoric mood. Negative for confusion, sleep disturbance and suicidal ideas. The patient is nervous/anxious.     Objective:  BP 120/80   Pulse 86   Temp 98.1 F (36.7 C) (Oral)   Ht '5\' 7"'  (1.702 m)   Wt 245 lb (111.1 kg)   SpO2 96%   BMI 38.37 kg/m   BP Readings from Last 3 Encounters:  12/04/18 120/80  09/03/18 126/82  06/03/18 112/76    Wt Readings from Last 3 Encounters:  12/04/18 245 lb (111.1 kg)  09/03/18 247 lb (112 kg)  06/03/18 239 lb (108.4 kg)     Physical Exam Constitutional:      General: She is not in acute distress.    Appearance: She is well-developed. She is obese.  HENT:     Head: Normocephalic.     Right Ear: External ear normal.     Left Ear: External ear normal.     Nose: Nose normal.  Eyes:     General:        Right eye: No discharge.        Left eye: No discharge.     Conjunctiva/sclera: Conjunctivae normal.     Pupils: Pupils are equal, round, and reactive to light.  Neck:     Musculoskeletal: Normal range of motion and neck supple.     Thyroid: No thyromegaly.     Vascular: No JVD.     Trachea: No tracheal deviation.  Cardiovascular:     Rate and Rhythm: Normal rate. Rhythm irregular.     Heart sounds: Normal heart sounds.  Pulmonary:     Effort: No respiratory distress.     Breath sounds: No stridor. No wheezing.  Abdominal:     General: Bowel sounds are normal. There is no distension.     Palpations: Abdomen is soft. There is no mass.     Tenderness: There is no abdominal tenderness. There is no guarding or rebound.  Musculoskeletal:        General: Tenderness present.     Right lower leg: Edema present.     Left lower leg: Edema present.  Lymphadenopathy:     Cervical: No cervical adenopathy.  Skin:    Findings: No erythema or rash.  Neurological:     Cranial Nerves: No cranial nerve deficit.     Motor: No abnormal muscle tone.     Coordination: Coordination normal.     Deep Tendon Reflexes: Reflexes normal.  Psychiatric:        Behavior: Behavior normal.        Thought Content: Thought content normal.        Judgment: Judgment normal.   trace edema  Lab Results  Component Value Date   WBC 7.5 06/26/2017   HGB 13.2 06/26/2017   HCT 40.2 06/26/2017   PLT 360 06/26/2017   GLUCOSE 123 (H) 09/03/2018   CHOL 191 02/19/2017   TRIG 183.0 (H) 02/19/2017   HDL 38.90 (L) 02/19/2017   LDLDIRECT 155.9 09/30/2012   LDLCALC 116 (H) 02/19/2017   ALT 15 06/22/2017   AST 24 06/22/2017   NA 137  09/03/2018   K 3.8 09/03/2018   CL 102 09/03/2018   CREATININE 0.91 09/03/2018   BUN 26 (H) 09/03/2018   CO2 24 09/03/2018   TSH 2.771 06/22/2017   INR 1.68 06/23/2017   HGBA1C 7.5 (H) 09/03/2018  MICROALBUR 4.5 (H) 12/02/2009    Dexascan  Result Date: 01/06/2018 Date of study: 01/04/18 Exam: DUAL X-RAY ABSORPTIOMETRY (DXA) FOR BONE MINERAL DENSITY (BMD) Instrument: [employer] Requesting Provider: PCP Indication: screening for osteoporosis Comparison: none (please note that it is not possible to compare data from different instruments) Clinical data: Pt is a 85 y.o. female with previous fractures. Results:  Lumbar spine L1-L4 Femoral neck (FN) 33% distal radius T-score 4.9 RFN: 2.5 LFN: 1.4 n/a Change in BMD from previous DXA test (%) n/a n/a n/a (*) statistically significant Assessment: the BMD is normal according to the [facility] classification for osteoporosis (see below). Fracture risk: low FRAX score: not calculated due to normal BMD Comments: the technical quality of the study is good  Degenerative change may falsely elevate spine score. Recommend optimizing calcium (1200 mg/day) and vitamin D (800 IU/day) intake. No pharmacological treatment is indicated. Followup: Repeat BMD is appropriate after 2 years. WHO criteria for diagnosis of osteoporosis in postmenopausal women and in men 52 y/o or older: - normal: T-score -1.0 to + 1.0 - osteopenia/low bone density: T-score between -2.5 and -1.0 - osteoporosis: T-score below -2.5 - severe osteoporosis: T-score below -2.5 with history of fragility fracture Note: although not part of the WHO classification, the presence of a fragility fracture, regardless of the T-score, should be considered diagnostic of osteoporosis, provided other causes for the fracture have been excluded. Loura Pardon MD    Assessment & Plan:   Diagnoses and all orders for this visit:  Need for influenza vaccination -     Flu Vaccine QUAD High Dose(Fluad)     No  orders of the defined types were placed in this encounter.    Follow-up: No follow-ups on file.  Walker Kehr, MD

## 2018-12-04 NOTE — Telephone Encounter (Signed)
furosemide (LASIX) 40 MG tablet  Need to confirm dosage. Please call to advise,

## 2018-12-04 NOTE — Patient Instructions (Signed)
If you have medicare related insurance (such as traditional Medicare, Blue Cross Medicare, United HealthCare Medicare, or similar), Please make an appointment at the scheduling desk with Jill, the Wellness Health Coach, for your Wellness visit in this office, which is a benefit with your insurance.  

## 2018-12-04 NOTE — Telephone Encounter (Signed)
40 mg twice a day is correct.  A new prescription emailed to mail pharmacy.  Thank you

## 2018-12-04 NOTE — Assessment & Plan Note (Signed)
Eliquis 

## 2018-12-05 ENCOUNTER — Telehealth: Payer: Self-pay | Admitting: Internal Medicine

## 2018-12-05 ENCOUNTER — Ambulatory Visit: Payer: HMO | Admitting: Cardiovascular Disease

## 2018-12-05 ENCOUNTER — Encounter: Payer: Self-pay | Admitting: Cardiovascular Disease

## 2018-12-05 VITALS — BP 122/83 | HR 70 | Ht 62.0 in | Wt 245.0 lb

## 2018-12-05 DIAGNOSIS — I5043 Acute on chronic combined systolic (congestive) and diastolic (congestive) heart failure: Secondary | ICD-10-CM

## 2018-12-05 DIAGNOSIS — I350 Nonrheumatic aortic (valve) stenosis: Secondary | ICD-10-CM

## 2018-12-05 NOTE — Patient Instructions (Signed)
Medication Instructions:  Your physician recommends that you continue on your current medications as directed. Please refer to the Current Medication list given to you today.  If you need a refill on your cardiac medications before your next appointment, please call your pharmacy.   Lab work: NONE  Testing/Procedures: Your physician has requested that you have an echocardiogram. Echocardiography is a painless test that uses sound waves to create images of your heart. It provides your doctor with information about the size and shape of your heart and how well your heart's chambers and valves are working. This procedure takes approximately one hour. There are no restrictions for this procedure. [facility] [address] NEXT WEEK   Follow-Up: 12/16/18 WITH DR [facility]

## 2018-12-05 NOTE — Telephone Encounter (Signed)
Left detailed message informing pharmacy of updated prescription.
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
## 2018-12-05 NOTE — Telephone Encounter (Signed)
Jessica [name], from pharmacy called and is requesting to know if the metformin was a change intended to be changed by PCP. Please advise.   Jessica [name] #. Jessica [name] states he does have a Psychologist, occupational.

## 2018-12-05 NOTE — Telephone Encounter (Signed)
New Rx was sent on 12/04/18. See meds.

## 2018-12-06 NOTE — Telephone Encounter (Signed)
Pharmacy informed this was changed due the Metformin ER recall.

## 2018-12-12 ENCOUNTER — Other Ambulatory Visit: Payer: Self-pay

## 2018-12-12 ENCOUNTER — Ambulatory Visit (HOSPITAL_COMMUNITY): Payer: HMO | Attending: Internal Medicine

## 2018-12-12 DIAGNOSIS — I350 Nonrheumatic aortic (valve) stenosis: Secondary | ICD-10-CM | POA: Diagnosis not present

## 2018-12-12 MED ORDER — PERFLUTREN LIPID MICROSPHERE
1.0000 mL | INTRAVENOUS | Status: AC | PRN
  Administered 2018-12-12: 1 mL via INTRAVENOUS

## 2018-12-13 ENCOUNTER — Encounter: Payer: Self-pay | Admitting: Cardiovascular Disease

## 2018-12-16 ENCOUNTER — Ambulatory Visit: Payer: HMO | Admitting: Cardiovascular Disease

## 2018-12-16 ENCOUNTER — Other Ambulatory Visit: Payer: Self-pay

## 2018-12-16 ENCOUNTER — Encounter: Payer: Self-pay | Admitting: Cardiovascular Disease

## 2018-12-16 VITALS — BP 118/62 | HR 93 | Ht 62.0 in | Wt 249.0 lb

## 2018-12-16 DIAGNOSIS — I10 Essential (primary) hypertension: Secondary | ICD-10-CM

## 2018-12-16 DIAGNOSIS — I350 Nonrheumatic aortic (valve) stenosis: Secondary | ICD-10-CM | POA: Diagnosis not present

## 2018-12-16 DIAGNOSIS — I5042 Chronic combined systolic (congestive) and diastolic (congestive) heart failure: Secondary | ICD-10-CM | POA: Diagnosis not present

## 2018-12-16 DIAGNOSIS — Z5181 Encounter for therapeutic drug level monitoring: Secondary | ICD-10-CM

## 2018-12-16 DIAGNOSIS — E7800 Pure hypercholesterolemia, unspecified: Secondary | ICD-10-CM

## 2018-12-16 MED ORDER — SACUBITRIL-VALSARTAN 97-103 MG PO TABS: 1.0000 | ORAL_TABLET | Freq: Two times a day (BID) | ORAL | 3 refills | Status: DC
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
## 2018-12-16 NOTE — Patient Instructions (Addendum)
Medication Instructions:  STOP LOSARTAN   INCREASE YOUR ENTRESTO TO 97-103 MG TWICE A DAY   If you need a refill on your cardiac medications before your next appointment, please call your pharmacy.   Lab work: BMET IN 1 WEEK   If you have labs (blood work) drawn today and your tests are completely normal, you will receive your results only by: [name] MyChart Message (if you have MyChart) OR . A paper copy in the mail If you have any lab test that is abnormal or we need to change your treatment, we will call you to review the results.  Testing/Procedures: NONE  Follow-Up: At [facility], you and your health needs are our priority.  As part of our continuing mission to provide you with exceptional heart care, we have created designated Provider Care Teams.  These Care Teams include your primary Cardiologist (physician) and Advanced Practice Providers (APPs -  Physician Assistants and Nurse Practitioners) who all work together to provide you with the care you need, when you need it. You will need a follow up appointment in 4 months.  Please call our office 2 months in advance to schedule this appointment.  You may see Skeet Latch, MD or one of the following Advanced Practice Providers on your designated Care Team:   Kerin Ransom, PA-C Roby Lofts, [place] . Sande Rives, PA-C  Your physician recommends that you schedule a follow-up appointment in: [place] have been referred to [place]

## 2018-12-19 ENCOUNTER — Other Ambulatory Visit: Payer: Self-pay | Admitting: Internal Medicine

## 2018-12-19 MED ORDER — ALLOPURINOL 100 MG PO TABS: 100.0000 mg | ORAL_TABLET | Freq: Every day | ORAL | 0 refills | Status: DC

## 2018-12-19 NOTE — Telephone Encounter (Signed)
Medication Refill - Medication: allopurinol (ZYLOPRIM) 100 MG tablet  Has the patient contacted their pharmacy? Yes - it has been ordered through mail order and is in route, but it is not to pt yet.  Pt wants to know if she can get a short supply sent in to the local pharmacy so that she can sleep.  Pt states she is completely out and her feet hurt so bad last night she couldn't sleep at all.  Pt wants to know when this has been called in so that she can go and pick it up. (Agent: If no, request that the patient contact the pharmacy for the refill.) (Agent: If yes, when and what did the pharmacy advise?)  Preferred Pharmacy (with phone number or street name):  [id number] - [place], [place] [id number] (Phone) [phone] (Fax)     Agent: Please be advised that RX refills may take up to 3 business days. We ask that you follow-up with your pharmacy.
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
## 2018-12-23 ENCOUNTER — Encounter: Payer: Self-pay | Admitting: Cardiovascular Disease

## 2018-12-23 ENCOUNTER — Ambulatory Visit: Payer: HMO

## 2018-12-23 ENCOUNTER — Other Ambulatory Visit: Payer: Self-pay

## 2018-12-23 ENCOUNTER — Ambulatory Visit: Payer: HMO | Admitting: Cardiovascular Disease

## 2018-12-23 VITALS — BP 104/62 | HR 75 | Ht 62.0 in | Wt 249.0 lb

## 2018-12-23 DIAGNOSIS — I350 Nonrheumatic aortic (valve) stenosis: Secondary | ICD-10-CM | POA: Diagnosis not present

## 2018-12-23 NOTE — Patient Instructions (Addendum)
You are scheduled for a Cardiac Catheterization on Monday, September 28 with Dr. Lauree Chandler.  1. Please arrive at the [facility] (Main Entrance A) at [facility]: [address] [place], [place] [address] at 8:30 AM (This time is two hours before your procedure to ensure your preparation). Free valet parking service is available.   Special note: Every effort is made to have your procedure done on time. Please understand that emergencies sometimes delay scheduled procedures.  2. Diet: Do not eat solid foods after midnight.  The patient may have clear liquids until 5am upon the day of the procedure.  3. Labs: You will have pre-procedure labs drawn TODAY.   You are scheduled for COVID SCREENING Friday, September 25 at [facility])  [address]  Stay in the far right lane and tell them you are there for pre-procedure testing  4. Medication instructions in preparation for your procedure:  1) Take your last dose of Eliquis Friday, 9/25   2) HOLD METFORMIN the morning of your cath and 48 hours afterward  3) HOLD LASIX the morning of your procedure  4) HOLD ENTRESTO the morning of your cath  5) Take ASPIRIN 81 mg the morning of your procedure  6) you may take your other meds as directed with sips of water   5. Plan for one night stay--bring personal belongings. 6. Bring a current list of your medications and current insurance cards. 7. You MUST have a responsible person to drive you home. 8. Someone MUST be with you the first 24 hours after you arrive home or your discharge will be delayed. 9. Please wear clothes that are easy to get on and off and wear slip-on shoes.  Thank you for allowing [place] to care for you!   -- [facility]

## 2018-12-25 ENCOUNTER — Telehealth: Payer: Self-pay | Admitting: Cardiovascular Disease

## 2018-12-25 LAB — BASIC METABOLIC PANEL
BUN/Creatinine Ratio: 24 (ref 12–28)
BUN: 25 mg/dL (ref 8–27)
CO2: 18 mmol/L — ABNORMAL LOW (ref 20–29)
Calcium: 10.7 mg/dL — ABNORMAL HIGH (ref 8.7–10.3)
Chloride: 101 mmol/L (ref 96–106)
Creatinine, Ser: 1.05 mg/dL — ABNORMAL HIGH (ref 0.57–1.00)
GFR calc Af Amer: 57 mL/min/{1.73_m2} — ABNORMAL LOW (ref >59)
GFR calc non Af Amer: 49 mL/min/{1.73_m2} — ABNORMAL LOW (ref >59)
Glucose: 137 mg/dL — ABNORMAL HIGH (ref 65–99)
Potassium: 4.2 mmol/L (ref 3.5–5.2)
Sodium: 143 mmol/L (ref 134–144)

## 2018-12-25 LAB — CBC WITH DIFFERENTIAL/PLATELET
Basophils Absolute: 0 10*3/uL (ref 0.0–0.2)
Basos: 1 %
EOS (ABSOLUTE): 0.1 10*3/uL (ref 0.0–0.4)
Eos: 2 %
Hematocrit: 46.3 % (ref 34.0–46.6)
Hemoglobin: 15.6 g/dL (ref 11.1–15.9)
Immature Grans (Abs): 0 10*3/uL (ref 0.0–0.1)
Immature Granulocytes: 0 %
Lymphocytes Absolute: 2.4 10*3/uL (ref 0.7–3.1)
Lymphs: 34 %
MCH: 30.8 pg (ref 26.6–33.0)
MCHC: 33.7 g/dL (ref 31.5–35.7)
MCV: 91 fL (ref 79–97)
Monocytes Absolute: 0.7 10*3/uL (ref 0.1–0.9)
Monocytes: 10 %
Neutrophils Absolute: 3.9 10*3/uL (ref 1.4–7.0)
Neutrophils: 53 %
Platelets: 313 10*3/uL (ref 150–450)
RBC: 5.07 x10E6/uL (ref 3.77–5.28)
RDW: 12.4 % (ref 11.7–15.4)
WBC: 7.2 10*3/uL (ref 3.4–10.8)
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
## 2018-12-25 NOTE — Telephone Encounter (Signed)
Returned call to patient she wanted to know if she still needs to have lab done tomorrow for cath on Mon 9/28.Advised labs done on 9/21 are all ok for cath.Advised she does not need to have repeated.She will be having Covid test tomorrow.Advised I will send message to Dr.Hartman's nurse.

## 2018-12-25 NOTE — Telephone Encounter (Signed)
New Message:   Pt said she had lab work last week when she saw Dr Oval Linsey. She wants to know if she is supposed to have lab work again this week. She said her paper had on it 1 week lab work.

## 2018-12-26 ENCOUNTER — Telehealth: Payer: Self-pay | Admitting: *Deleted

## 2018-12-26 ENCOUNTER — Ambulatory Visit: Payer: HMO

## 2018-12-26 ENCOUNTER — Other Ambulatory Visit: Payer: Self-pay

## 2018-12-26 NOTE — Telephone Encounter (Signed)
Pt contacted pre-catheterization scheduled at [facility] for: Monday December 30, 2018 10:30 AM Verified arrival time and place: [facility]) at: 8:30 AM   No solid food after midnight prior to cath, clear liquids until 5 AM day of procedure. Contrast allergy: no  Hold: Eliquis-last dose 12/27/18 until post procedure. Entresto-day before and day of procedure-GFR 49 Lasix-day before and day of procedure-GFR 49 KCl-day before and day of procedure. Metformin-day of procedure and 48 hours post procedure. Jardiance-AM of procedure.  Except hold medications AM meds can be  taken pre-cath with sip of water including: ASA 81 mg   Confirmed patient has responsible person to drive home post procedure and observe 24 hours after arriving home: yes  Currently, due to Covid-19 pandemic, only one support person will be allowed with patient. Must be the same support person for that patient's entire stay, will be screened and required to wear a mask. They will be asked to wait in the waiting room for the duration of the patient's stay.  Patients are required to wear a mask when they enter the hospital.      COVID-19 Pre-Screening Questions:  . In the past 7 to 10 days have you had a cough,  shortness of breath, headache, congestion, fever (100 or greater) body aches, chills, sore throat, or sudden loss of taste or sense of smell? no . Have you been around anyone with known Covid 19? no . Have you been around anyone who is awaiting Covid 19 test results in the past 7 to 10 days? no . Have you been around anyone who has been exposed to Covid 19, or has mentioned symptoms of Covid 19 within the past 7 to 10 days? no   I reviewed procedure/mask/visitor instructions, Covid-19 screening questions with patient, she verbalized understanding, thanked me for call.

## 2018-12-26 NOTE — Telephone Encounter (Signed)
Called patient x3 for the scheduled telephonic AWV. Nurse did not reach patient and was not able to LVM.

## 2018-12-27 ENCOUNTER — Other Ambulatory Visit (HOSPITAL_COMMUNITY)
Admission: RE | Admit: 2018-12-27 | Discharge: 2018-12-27 | Disposition: A | Discharge status: Home / Self Care | Payer: HMO | Source: Ambulatory Visit | Attending: Cardiovascular Disease | Admitting: Cardiovascular Disease

## 2018-12-27 ENCOUNTER — Other Ambulatory Visit: Payer: Self-pay | Admitting: Physician Assistant

## 2018-12-27 ENCOUNTER — Ambulatory Visit (HOSPITAL_COMMUNITY)
Admission: RE | Admit: 2018-12-27 | Discharge: 2018-12-27 | Disposition: A | Discharge status: Home / Self Care | Payer: HMO | Source: Ambulatory Visit | Attending: Cardiovascular Disease | Admitting: Cardiovascular Disease

## 2018-12-27 ENCOUNTER — Emergency Department (HOSPITAL_COMMUNITY)
Admission: EM | Admit: 2018-12-27 | Discharge: 2018-12-27 | Disposition: A | Discharge status: Other Acute Inpatient Hospital | Payer: HMO

## 2018-12-27 DIAGNOSIS — E785 Hyperlipidemia, unspecified: Secondary | ICD-10-CM | POA: Insufficient documentation

## 2018-12-27 DIAGNOSIS — I10 Essential (primary) hypertension: Secondary | ICD-10-CM | POA: Diagnosis not present

## 2018-12-27 DIAGNOSIS — Z01818 Encounter for other preprocedural examination: Secondary | ICD-10-CM | POA: Insufficient documentation

## 2018-12-27 DIAGNOSIS — Z20828 Contact with and (suspected) exposure to other viral communicable diseases: Secondary | ICD-10-CM | POA: Diagnosis not present

## 2018-12-27 DIAGNOSIS — E119 Type 2 diabetes mellitus without complications: Secondary | ICD-10-CM | POA: Diagnosis not present

## 2018-12-27 DIAGNOSIS — I350 Nonrheumatic aortic (valve) stenosis: Secondary | ICD-10-CM | POA: Diagnosis not present

## 2018-12-27 NOTE — Progress Notes (Signed)
Carotid duplex has been completed.   Preliminary results in CV Proc.   Abram Sander 12/27/2018 9:45 AM

## 2018-12-28 LAB — NOVEL CORONAVIRUS, NAA (HOSP ORDER, SEND-OUT TO REF LAB; TAT 18-24 HRS): SARS-CoV-2, NAA: NOT DETECTED

## 2018-12-30 ENCOUNTER — Ambulatory Visit (HOSPITAL_COMMUNITY)
Admission: RE | Admit: 2018-12-30 | Discharge: 2018-12-30 | Disposition: A | Discharge status: Home / Self Care | Payer: HMO | Attending: Cardiovascular Disease | Admitting: Cardiovascular Disease

## 2018-12-30 ENCOUNTER — Ambulatory Visit (HOSPITAL_COMMUNITY)
Admission: RE | Disposition: A | Discharge status: Home / Self Care | Payer: HMO | Source: Home / Self Care | Attending: Cardiovascular Disease

## 2018-12-30 ENCOUNTER — Other Ambulatory Visit: Payer: Self-pay

## 2018-12-30 ENCOUNTER — Other Ambulatory Visit: Payer: Self-pay | Admitting: Physician Assistant

## 2018-12-30 DIAGNOSIS — Z79890 Hormone replacement therapy (postmenopausal): Secondary | ICD-10-CM | POA: Insufficient documentation

## 2018-12-30 DIAGNOSIS — E669 Obesity, unspecified: Secondary | ICD-10-CM | POA: Insufficient documentation

## 2018-12-30 DIAGNOSIS — Z6841 Body Mass Index (BMI) 40.0 and over, adult: Secondary | ICD-10-CM | POA: Insufficient documentation

## 2018-12-30 DIAGNOSIS — I110 Hypertensive heart disease with heart failure: Secondary | ICD-10-CM | POA: Diagnosis not present

## 2018-12-30 DIAGNOSIS — Z823 Family history of stroke: Secondary | ICD-10-CM | POA: Diagnosis not present

## 2018-12-30 DIAGNOSIS — I5042 Chronic combined systolic (congestive) and diastolic (congestive) heart failure: Secondary | ICD-10-CM | POA: Insufficient documentation

## 2018-12-30 DIAGNOSIS — E1142 Type 2 diabetes mellitus with diabetic polyneuropathy: Secondary | ICD-10-CM | POA: Diagnosis not present

## 2018-12-30 DIAGNOSIS — I4892 Unspecified atrial flutter: Secondary | ICD-10-CM | POA: Diagnosis not present

## 2018-12-30 DIAGNOSIS — K219 Gastro-esophageal reflux disease without esophagitis: Secondary | ICD-10-CM | POA: Insufficient documentation

## 2018-12-30 DIAGNOSIS — Z7984 Long term (current) use of oral hypoglycemic drugs: Secondary | ICD-10-CM | POA: Insufficient documentation

## 2018-12-30 DIAGNOSIS — Z885 Allergy status to narcotic agent status: Secondary | ICD-10-CM | POA: Insufficient documentation

## 2018-12-30 DIAGNOSIS — Z888 Allergy status to other drugs, medicaments and biological substances status: Secondary | ICD-10-CM | POA: Insufficient documentation

## 2018-12-30 DIAGNOSIS — Z950 Presence of cardiac pacemaker: Secondary | ICD-10-CM | POA: Diagnosis not present

## 2018-12-30 DIAGNOSIS — E039 Hypothyroidism, unspecified: Secondary | ICD-10-CM | POA: Diagnosis not present

## 2018-12-30 DIAGNOSIS — Z8249 Family history of ischemic heart disease and other diseases of the circulatory system: Secondary | ICD-10-CM | POA: Insufficient documentation

## 2018-12-30 DIAGNOSIS — M1990 Unspecified osteoarthritis, unspecified site: Secondary | ICD-10-CM | POA: Insufficient documentation

## 2018-12-30 DIAGNOSIS — E785 Hyperlipidemia, unspecified: Secondary | ICD-10-CM | POA: Insufficient documentation

## 2018-12-30 DIAGNOSIS — Z7901 Long term (current) use of anticoagulants: Secondary | ICD-10-CM | POA: Insufficient documentation

## 2018-12-30 DIAGNOSIS — I2510 Atherosclerotic heart disease of native coronary artery without angina pectoris: Secondary | ICD-10-CM | POA: Diagnosis not present

## 2018-12-30 DIAGNOSIS — I350 Nonrheumatic aortic (valve) stenosis: Secondary | ICD-10-CM

## 2018-12-30 DIAGNOSIS — Z86718 Personal history of other venous thrombosis and embolism: Secondary | ICD-10-CM | POA: Diagnosis not present

## 2018-12-30 DIAGNOSIS — I4891 Unspecified atrial fibrillation: Secondary | ICD-10-CM | POA: Diagnosis not present

## 2018-12-30 DIAGNOSIS — Z79899 Other long term (current) drug therapy: Secondary | ICD-10-CM | POA: Diagnosis not present

## 2018-12-30 DIAGNOSIS — Z86711 Personal history of pulmonary embolism: Secondary | ICD-10-CM | POA: Insufficient documentation

## 2018-12-30 HISTORY — PX: RIGHT/LEFT HEART CATH AND CORONARY ANGIOGRAPHY: CATH118266

## 2018-12-30 LAB — GLUCOSE, CAPILLARY: Glucose-Capillary: 147 mg/dL — ABNORMAL HIGH (ref 70–99)

## 2018-12-30 SURGERY — RIGHT/LEFT HEART CATH AND CORONARY ANGIOGRAPHY
Anesthesia: LOCAL

## 2018-12-30 MED ORDER — HEPARIN (PORCINE) IN NACL 1000-0.9 UT/500ML-% IV SOLN
INTRAVENOUS | Status: DC | PRN
  Administered 2018-12-30 (×2): 500 mL

## 2018-12-30 MED ORDER — SODIUM CHLORIDE 0.9 % IV SOLN: INTRAVENOUS | Status: AC

## 2018-12-30 MED ORDER — SODIUM CHLORIDE 0.9 % WEIGHT BASED INFUSION
1.0000 mL/kg/h | INTRAVENOUS | Status: DC
  Administered 2018-12-30: 250 mL via INTRAVENOUS

## 2018-12-30 MED ORDER — ONDANSETRON HCL 4 MG/2ML IJ SOLN: 4.0000 mg | Freq: Four times a day (QID) | INTRAMUSCULAR | Status: DC | PRN

## 2018-12-30 MED ORDER — MIDAZOLAM HCL 2 MG/2ML IJ SOLN
INTRAMUSCULAR | Status: AC
  Filled 2018-12-30: qty 2

## 2018-12-30 MED ORDER — FENTANYL CITRATE (PF) 100 MCG/2ML IJ SOLN
INTRAMUSCULAR | Status: DC | PRN
  Administered 2018-12-30 (×2): 25 ug via INTRAVENOUS

## 2018-12-30 MED ORDER — IOHEXOL 350 MG/ML SOLN
INTRAVENOUS | Status: DC | PRN
  Administered 2018-12-30: 60 mL

## 2018-12-30 MED ORDER — SODIUM CHLORIDE 0.9 % IV SOLN: 250.0000 mL | INTRAVENOUS | Status: DC | PRN

## 2018-12-30 MED ORDER — SODIUM CHLORIDE 0.9 % WEIGHT BASED INFUSION
3.0000 mL/kg/h | INTRAVENOUS | Status: AC
  Administered 2018-12-30: 3 mL/kg/h via INTRAVENOUS

## 2018-12-30 MED ORDER — FENTANYL CITRATE (PF) 100 MCG/2ML IJ SOLN
INTRAMUSCULAR | Status: AC
  Filled 2018-12-30: qty 2

## 2018-12-30 MED ORDER — SODIUM CHLORIDE 0.9% FLUSH: 3.0000 mL | INTRAVENOUS | Status: DC | PRN

## 2018-12-30 MED ORDER — SODIUM CHLORIDE 0.9% FLUSH: 3.0000 mL | Freq: Two times a day (BID) | INTRAVENOUS | Status: DC

## 2018-12-30 MED ORDER — ACETAMINOPHEN 325 MG PO TABS: 650.0000 mg | ORAL_TABLET | ORAL | Status: DC | PRN

## 2018-12-30 MED ORDER — HEPARIN SODIUM (PORCINE) 1000 UNIT/ML IJ SOLN
INTRAMUSCULAR | Status: AC
  Filled 2018-12-30: qty 1

## 2018-12-30 MED ORDER — ASPIRIN 81 MG PO CHEW: 81.0000 mg | CHEWABLE_TABLET | ORAL | Status: DC

## 2018-12-30 MED ORDER — LIDOCAINE HCL (PF) 1 % IJ SOLN
INTRAMUSCULAR | Status: AC
  Filled 2018-12-30: qty 30

## 2018-12-30 MED ORDER — LIDOCAINE HCL (PF) 1 % IJ SOLN
INTRAMUSCULAR | Status: DC | PRN
  Administered 2018-12-30: 15 mL
  Administered 2018-12-30: 5 mL

## 2018-12-30 MED ORDER — MIDAZOLAM HCL 2 MG/2ML IJ SOLN
INTRAMUSCULAR | Status: DC | PRN
  Administered 2018-12-30: 1 mg via INTRAVENOUS

## 2018-12-30 MED ORDER — HYDRALAZINE HCL 20 MG/ML IJ SOLN: 10.0000 mg | INTRAMUSCULAR | Status: DC | PRN

## 2018-12-30 MED ORDER — HEPARIN (PORCINE) IN NACL 1000-0.9 UT/500ML-% IV SOLN
INTRAVENOUS | Status: AC
  Filled 2018-12-30: qty 1000

## 2018-12-30 MED ORDER — VERAPAMIL HCL 2.5 MG/ML IV SOLN
INTRAVENOUS | Status: AC
  Filled 2018-12-30: qty 2

## 2018-12-30 SURGICAL SUPPLY — 20 items
CATH BALLN WEDGE 5F 110CM (CATHETERS) ×1 IMPLANT
CATH INFINITI 5 FR 3DRC (CATHETERS) ×1 IMPLANT
CATH INFINITI 5 FR AL2 (CATHETERS) ×1 IMPLANT
CATH INFINITI 5FR AL1 (CATHETERS) ×1 IMPLANT
CATH INFINITI MULTIPACK ANG 4F (CATHETERS) ×1 IMPLANT
CLOSURE MYNX CONTROL 5F (Vascular Products) ×1 IMPLANT
GLIDESHEATH SLEND SS 6F .021 (SHEATH) ×1 IMPLANT
GUIDEWIRE .025 260CM (WIRE) ×1 IMPLANT
GUIDEWIRE INQWIRE 1.5J.035X260 (WIRE) IMPLANT
INQWIRE 1.5J .035X260CM (WIRE) ×2
KIT HEART LEFT (KITS) ×2 IMPLANT
PACK CARDIAC CATHETERIZATION (CUSTOM PROCEDURE TRAY) ×2 IMPLANT
SHEATH GLIDE SLENDER 4/5FR (SHEATH) ×1 IMPLANT
SHEATH PINNACLE 4F 10CM (SHEATH) ×1 IMPLANT
SHEATH PINNACLE 5F 10CM (SHEATH) ×1 IMPLANT
SHEATH PROBE COVER 6X72 (BAG) ×1 IMPLANT
TRANSDUCER W/STOPCOCK (MISCELLANEOUS) ×2 IMPLANT
TUBING CIL FLEX 10 FLL-RA (TUBING) ×2 IMPLANT
WIRE EMERALD 3MM-J .035X150CM (WIRE) ×1 IMPLANT
WIRE EMERALD ST .035X150CM (WIRE) ×1 IMPLANT

## 2018-12-30 NOTE — Interval H&P Note (Signed)
History and Physical Interval Note:  12/30/2018 11:31 AM  Jessica [name]  has presented today for surgery, with the diagnosis of arotic stenosis.  The various methods of treatment have been discussed with the patient and family. After consideration of risks, benefits and other options for treatment, the patient has consented to  Procedure(s): RIGHT/LEFT HEART CATH AND CORONARY ANGIOGRAPHY (N/A) as a surgical intervention.  The patient's history has been reviewed, patient examined, no change in status, stable for surgery.  I have reviewed the patient's chart and labs.  Questions were answered to the patient's satisfaction.    Cath Lab Visit (complete for each Cath Lab visit)  Clinical Evaluation Leading to the Procedure:   ACS: No.  Non-ACS:    Anginal Classification: CCS II  Anti-ischemic medical therapy: No Therapy  Non-Invasive Test Results: No non-invasive testing performed  Prior CABG: No previous CABG         Lauree Chandler

## 2018-12-30 NOTE — Discharge Instructions (Signed)
Femoral Site Care  Resume Eliquis on 12/31/18 if no bleeding from groin cath site You may shower in 24 hours.  Drink plenty of fluids for the next 24-48 hours.   This sheet gives you information about how to care for yourself after your procedure. Your health care provider may also give you more specific instructions. If you have problems or questions, contact your health care provider. What can I expect after the procedure? After the procedure, it is common to have:  Bruising that usually fades within 1-2 weeks.  Tenderness at the site. Follow these instructions at home: Wound care  Follow instructions from your health care provider about how to take care of your insertion site. Make sure you: ? Wash your hands with soap and water before you change your bandage (dressing). If soap and water are not available, use hand sanitizer. ? Change your dressing as told by your health care provider. ? Leave stitches (sutures), skin glue, or adhesive strips in place. These skin closures may need to stay in place for 2 weeks or longer. If adhesive strip edges start to loosen and curl up, you may trim the loose edges. Do not remove adhesive strips completely unless your health care provider tells you to do that.  Do not take baths, swim, or use a hot tub until your health care provider approves.  You may shower 24-48 hours after the procedure or as told by your health care provider. ? Gently wash the site with plain soap and water. ? Pat the area dry with a clean towel. ? Do not rub the site. This may cause bleeding.  Do not apply powder or lotion to the site. Keep the site clean and dry.  Check your femoral site every day for signs of infection. Check for: ? Redness, swelling, or pain. ? Fluid or blood. ? Warmth. ? Pus or a bad smell. Activity  For the first 2-3 days after your procedure, or as long as directed: ? Avoid climbing stairs as much as possible. ? Do not squat.  Do not lift  anything that is heavier than 10 lb (4.5 kg), or the limit that you are told, until your health care provider says that it is safe.  Rest as directed. ? Avoid sitting for a long time without moving. Get up to take short walks every 1-2 hours.  Do not drive for 24 hours if you were given a medicine to help you relax (sedative). General instructions  Take over-the-counter and prescription medicines only as told by your health care provider.  Keep all follow-up visits as told by your health care provider. This is important. Contact a health care provider if you have:  A fever or chills.  You have redness, swelling, or pain around your insertion site. Get help right away if:  The catheter insertion area swells very fast.  You pass out.  You suddenly start to sweat or your skin gets clammy.  The catheter insertion area is bleeding, and the bleeding does not stop when you hold steady pressure on the area.  The area near or just beyond the catheter insertion site becomes pale, cool, tingly, or numb. These symptoms may represent a serious problem that is an emergency. Do not wait to see if the symptoms will go away. Get medical help right away. Call your local emergency services (911 in the [place] Do not drive yourself to the hospital. Summary  After the procedure, it is common to have bruising that usually  fades within 1-2 weeks.  Check your femoral site every day for signs of infection.  Do not lift anything that is heavier than 10 lb (4.5 kg), or the limit that you are told, until your health care provider says that it is safe. This information is not intended to replace advice given to you by your health care provider. Make sure you discuss any questions you have with your health care provider. Document Released: 11/21/2013 Document Revised: 04/02/2017 Document Reviewed: 04/02/2017 Elsevier Patient Education  2020 [place] on 12/31/18 if no bleeding from groin  cath site

## 2018-12-30 NOTE — Patient Outreach (Signed)
  [facility]) Care Management Chronic Special Needs Program    12/30/2018  Name: Jessica [name], DOB: 18-Aug-1933  MRN: [id number]   Ms. Deserae Durrette is enrolled in a chronic special needs plan for Heart Failure. Cardiac cath today-severe aortic stenosis. Per chart, Client admitted.  Care plan sent to utilization management team.  Plan: Care Coordination with hospital liaison/inpatient care management team as needed. RNCM will continue to follow up as client's C-SNP care management coordinator.  Thea Silversmith, RN, MSN, [place] [phone]

## 2018-12-31 ENCOUNTER — Encounter (HOSPITAL_COMMUNITY): Payer: Self-pay | Admitting: Cardiovascular Disease

## 2018-12-31 ENCOUNTER — Other Ambulatory Visit: Payer: Self-pay

## 2018-12-31 LAB — POCT I-STAT EG7
Acid-base deficit: 1 mmol/L (ref 0.0–2.0)
Bicarbonate: 25.1 mmol/L (ref 20.0–28.0)
Calcium, Ion: 1.07 mmol/L — ABNORMAL LOW (ref 1.15–1.40)
HCT: 40 % (ref 36.0–46.0)
Hemoglobin: 13.6 g/dL (ref 12.0–15.0)
O2 Saturation: 68 %
Potassium: 3.6 mmol/L (ref 3.5–5.1)
Sodium: 145 mmol/L (ref 135–145)
TCO2: 26 mmol/L (ref 22–32)
pCO2, Ven: 45.5 mmHg (ref 44.0–60.0)
pH, Ven: 7.349 (ref 7.250–7.430)
pO2, Ven: 37 mmHg (ref 32.0–45.0)

## 2018-12-31 MED FILL — Verapamil HCl IV Soln 2.5 MG/ML: INTRAVENOUS | Qty: 2 | Status: AC

## 2018-12-31 MED FILL — Heparin Sodium (Porcine) Inj 1000 Unit/ML: INTRAMUSCULAR | Qty: 10 | Status: AC

## 2018-12-31 NOTE — Patient Outreach (Signed)
  [facility]) Care Management Chronic Special Needs Program    12/31/2018  Name: Jessica [name], DOB: May 01, 1933  MRN: [id number]   Ms. Jessica [name] is enrolled in a chronic special needs plan. Per chart cardiac cath completed on 12/30/2018. Client discharged to home same day.  Plan: RNCM will continue to follow as client's C-SNP chronic care management coordinator. Outreach at next scheduled outreach.   Thea Silversmith, RN, MSN, [place] [phone]

## 2019-01-02 ENCOUNTER — Other Ambulatory Visit: Payer: Self-pay

## 2019-01-02 NOTE — Patient Outreach (Signed)
  [facility]) Care Management Chronic Special Needs Program  01/02/2019  Name: Jessica [name] DOB: 1933/08/23  MRN: [id number]  Jessica [name] is enrolled in a Chronic Special Needs Plan. RNCM called to follow up and review individualized care plan. No answer. HIPAA compliant message left.   Plan: Chronic care management coordinator will attempt outreach within 2-3 weeks.  Thea Silversmith, RN, MSN, [place] [phone]

## 2019-01-06 ENCOUNTER — Other Ambulatory Visit: Payer: Self-pay

## 2019-01-06 ENCOUNTER — Ambulatory Visit: Payer: Self-pay | Admitting: Pharmacist

## 2019-01-06 NOTE — Patient Outreach (Signed)
[facility]) Care Management Chronic Special Needs Program  01/06/2019  Name: Jessica [name] DOB: May 28, 1933  MRN: [id number]  Jessica [name] is enrolled in a chronic special needs plan for Heart failure. Reviewed and updated care plan.  Subjective: client reports recent hospitalization and work up regarding severe aortic stenosis. She reports she has support around her that will assist her with her shopping, but states she prefers to do her own grocery shopping and takes her time. She reports she adheres to Covid-19 precautions. She reports upcoming appointment with imaging on tomorrow; rehabilitation on October8 and cardiac surgeon appointment on October 8th followed by cardiologist appointment October 15. Client reports primary care last seen Sept 2. Client without any specific concerns right now. She reports she has fallen within the last three months-outside she reports the ground was off balance. She states she has an alert necklace through her company, but does not wear it all the time. She states she is taking medications as scheduled and weighing self daily. Client acknowledges signs/symptoms for when to call the doctor related to heart failure exacerbation.  Goals Addressed            This Visit's Progress   .  Acknowledge receipt of Advanced Directive package   On track   . Advanced Care Planning complete as directed by client   On track   . Be as active and as independent as possible prior goal   On track    Enjoy life, family and worship God    . COMPLETED: Client understands the importance of follow-up with providers by attending scheduled visits       Voiced importance of follow up with providers.    . COMPLETED: Client verbalize knowledge of Heart Failure disease self management skills within the next 6 months.       Verbalized taking medications, follow up with providers as scheduled; monitoring weights and monitoring salt intake.    .  COMPLETED: Client will report no worsening of symptoms of Atrial Fibrillation within the next 6 months       Denies any symptoms.    . Client will report no worsening of symptoms related to heart disease within the next 6 months    On track    Reports increased shortness of breath    . Client will verbalize knowledge of diabetes self-management as evidenced by Hgb A1C <7 or as defined by provider.   On track    Diabetes self management actions:  Glucose monitoring per provider recommendations  Perform Quality checks on blood meter  Eat Healthy  Check feet daily  Visit provider every 3-6 months as directed  Hbg A1C level every 3-6 months.  Eye Exam yearly    . COMPLETED: Client will verbalize knowledge of self management of Hypertension as evidences by BP reading of 140/90 or less; or as defined by provider       Reports she takes medications as prescribed; attends follow up appointments as scheduled; blood pressure less than 140/90.    [name] COMPLETED: Exercise 3x per week (30 min per time) prior goal   Not on track    Goal from 2016: Activity Goal rolled over into another activity/exercise goal.    . COMPLETED: Maintain timely refills of Heart Failure medication as prescribed within the year        Denies any difficulty obtaining medications.    . Obtain annual  Lipid Profile, LDL-C   On track   . paient -  prior goal   On track    Stay joyful and healthy.    . Patient Stated-prior goal   On track    Talk to your doctors about what is appropriate exercise for you. F Follow up with your rehabilitation appointment on 01/09/2019  Increase my physical activity by doing chair exercises, walk more.      . COMPLETED: Visit Primary Care Provider or Cardiologist at least 2 times per year       Has seen providers at least two times/year.      RNCM discussed fall precaution. RNCM encouraged client to wear her alarm necklace at all times. Reinforced positive statements regarding her  follow up with provider visits and self care.  Plan: RNCM will update care plan; send updated care plan to client; send updated care plan to primary care. Send education regarding falls; send another advanced directives packet.   Chronic care management coordinator will outreach within 6 months.   Thea Silversmith, RN, MSN, [place] [phone]   .

## 2019-01-07 ENCOUNTER — Other Ambulatory Visit: Payer: Self-pay

## 2019-01-07 ENCOUNTER — Ambulatory Visit (HOSPITAL_COMMUNITY)
Admission: RE | Admit: 2019-01-07 | Discharge: 2019-01-07 | Disposition: A | Discharge status: Home / Self Care | Payer: HMO | Source: Ambulatory Visit | Attending: Cardiovascular Disease | Admitting: Cardiovascular Disease

## 2019-01-07 DIAGNOSIS — I350 Nonrheumatic aortic (valve) stenosis: Secondary | ICD-10-CM | POA: Diagnosis not present

## 2019-01-07 DIAGNOSIS — R911 Solitary pulmonary nodule: Secondary | ICD-10-CM | POA: Diagnosis not present

## 2019-01-07 DIAGNOSIS — Z01818 Encounter for other preprocedural examination: Secondary | ICD-10-CM | POA: Diagnosis not present

## 2019-01-07 IMAGING — CT CT CTA ABD/PEL W/CM AND/OR W/O CM
1 series · 1 of 9 positions shown · IV contrast (omnipaque)
Comparison: CT the abdomen and pelvis 08/03/2005.

CLINICAL DATA: 85-year-old female with history of severe aortic
stenosis. Preprocedural study prior to potential transcatheter
aortic valve replacement (TAVR) procedure.

EXAM:
CT ANGIOGRAPHY CHEST, ABDOMEN AND PELVIS
TECHNIQUE: Multidetector CT imaging through the chest, abdomen and pelvis was
performed using the standard protocol during bolus administration of
intravenous contrast. Multiplanar reconstructed images and MIPs were
obtained and reviewed to evaluate the vascular anatomy.
CONTRAST:  100mL OMNIPAQUE IOHEXOL 350 MG/ML SOLN

[Series 347: — · 0.75mm/px · 1 of 9 slices shown]
[im 5/9]
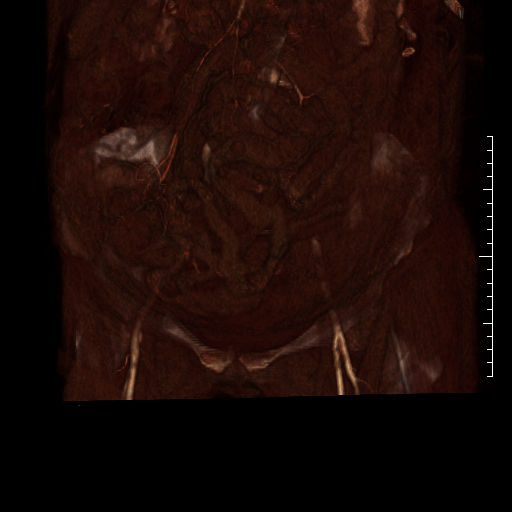

[1 of 9 positions shown; findings below may reference images not displayed]

FINDINGS: CTA CHEST FINDINGS

Cardiovascular: Heart size is mildly enlarged. There is no
significant pericardial fluid, thickening or pericardial
calcification. There is aortic atherosclerosis, as well as
atherosclerosis of the great vessels of the mediastinum and the
coronary arteries, including calcified atherosclerotic plaque in the
left main, left anterior descending and right coronary arteries.
Severe thickening calcification of the aortic valve and mitral
annulus.

Mediastinum/Lymph Nodes: No pathologically enlarged mediastinal or
hilar lymph nodes. Please note that accurate exclusion of hilar
adenopathy is limited on noncontrast CT scans. Large hiatal hernia.
No axillary lymphadenopathy.

Lungs/Pleura: No acute consolidative airspace disease. No pleural
effusions. In the inferior segment of the lingula there is a 9 x 7
mm (mean diameter of 8 mm) pulmonary nodule (axial image 63 of
series 17).

Musculoskeletal/Soft Tissues: There are no aggressive appearing
lytic or blastic lesions noted in the visualized portions of the
skeleton.

CTA ABDOMEN AND PELVIS FINDINGS

Hepatobiliary: No suspicious cystic or solid hepatic lesions. No
intrahepatic biliary ductal dilatation. Gallbladder is not
visualized, presumably surgically absent. No surgical clips are
noted in the gallbladder fossa. Common bile duct is dilated up to 11
mm in the porta hepatis, likely reflective of benign post
cholecystectomy physiology.

Pancreas: No pancreatic mass. No pancreatic ductal dilatation. No
pancreatic or peripancreatic fluid collections or inflammatory
changes.

Spleen: Unremarkable.

Adrenals/Urinary Tract: Multiple subcentimeter low-attenuation
lesions in the left kidney, too small to characterize, but
statistically likely to represent tiny cysts. Right kidney and
bilateral adrenal glands are normal in appearance. No
hydroureteronephrosis. Urinary bladder is normal in appearance.

Stomach/Bowel: Intra-abdominal portion of the stomach is normal. No
pathologic dilatation of small bowel or colon. Numerous colonic
diverticulae are noted, without surrounding inflammatory changes to
suggest an acute diverticulitis at this time. The appendix is not
confidently identified and may be surgically absent. Regardless,
there are no inflammatory changes noted adjacent to the cecum to
suggest the presence of an acute appendicitis at this time.

Vascular/Lymphatic: Aortic atherosclerosis, without evidence of
aneurysm or dissection in the abdominal or pelvic vasculature. No
lymphadenopathy noted in the abdomen or pelvis.

Reproductive: 1.9 x 1.2 cm peripherally calcified lesion in the
right-side of the uterine body, likely a calcified fibroid. Ovaries
are unremarkable in appearance.

Other: No significant volume of ascites.  No pneumoperitoneum.

Musculoskeletal: There are no aggressive appearing lytic or blastic
lesions noted in the visualized portions of the skeleton.

VASCULAR MEASUREMENTS PERTINENT TO TAVR:

AORTA:

Minimal Aortic Qiameter-VV x 10 mm

Severity of Aortic Calcification-severe

RIGHT PELVIS:

Right Common Iliac Artery -

Minimal 1iameter-M.A x 8.7 mm

Tortuosity-mild

Calcification-moderate

Right External Iliac Artery -

Minimal Biameter-B.R x 5.9 mm

Tortuosity - mild

Calcification-mild

Right Common Femoral Artery -

Minimal Eiameter-Y.B x 6.1 mm

Tortuosity - mild

Calcification-mild

LEFT PELVIS:

Left Common Iliac Artery -

Minimal 2iameter-P.U x 7.7 mm

Tortuosity - mild

Calcification-moderate

Left External Iliac Artery -

Minimal Jiameter-X.M x 7.2 mm

Tortuosity - mild

Calcification-none

Left Common Femoral Artery -

Minimal Xiameter-T.2 x 6.3 mm

Tortuosity - mild

Calcification-none

Review of the MIP images confirms the above findings.
IMPRESSION: 1. Vascular findings and measurements pertinent to potential TAVR
procedure, as detailed above.
2. Severe thickening calcification of the aortic valve, compatible
with the reported clinical history of severe aortic stenosis.
3. Aortic atherosclerosis, in addition to left main and 2 vessel
coronary artery disease.
4. Nodule in the inferior segment of the lingula with a mean
diameter of 8 mm. Non-contrast chest CT at 6-12 months is
recommended. If the nodule is stable at time of repeat CT, then
future CT at 18-24 months (from today's scan) is considered optional
for low-risk patients, but is recommended for high-risk patients.
This recommendation follows the consensus statement: Guidelines for
Management of Incidental Pulmonary Nodules Detected on CT Images:
5. Severe colonic diverticulosis without evidence of acute
diverticulitis at this time.
6. Additional incidental findings, as above.

## 2019-01-07 MED ORDER — IOHEXOL 350 MG/ML SOLN
100.0000 mL | Freq: Once | INTRAVENOUS | Status: AC | PRN
  Administered 2019-01-07: 100 mL via INTRAVENOUS

## 2019-01-08 ENCOUNTER — Other Ambulatory Visit: Payer: Self-pay

## 2019-01-08 DIAGNOSIS — I350 Nonrheumatic aortic (valve) stenosis: Secondary | ICD-10-CM

## 2019-01-08 NOTE — Patient Outreach (Signed)
  [facility]) Care Management Chronic Special Needs Program   01/08/2019  Name: Jessica [name], DOB: 1933/07/07  MRN: [id number]  The client was discussed in today's interdisciplinary care team meeting.  The following issues were discussed:  Client's needs, Changes in health status, Care Plan, Coordination of care, Care transitions and Issues/barriers to care  Participants present:               Thea Silversmith, MSN, RN, CCM                     Melissa Sandlin RN,BSN,CCM, CDE  Kelli Churn, RN, CCM, CDE Quinn Plowman RN, BSN, CCM            Marco Collie, MD Maryella Shivers, MD            Gilda Crease, PharmD, RPh Bary Castilla, RN, BSN, MS, CCM Coralie Carpen, MD  Recommendations: follow up regarding home support.  Follow-up: RNCM will continue to follow as client's C-SNP chronic care management coordinator.  Thea Silversmith, RN, MSN, [place] [phone]

## 2019-01-09 ENCOUNTER — Other Ambulatory Visit: Payer: Self-pay

## 2019-01-09 ENCOUNTER — Ambulatory Visit: Payer: HMO | Attending: Physician Assistant | Admitting: Physical Therapy

## 2019-01-09 ENCOUNTER — Encounter: Payer: Self-pay | Admitting: Physical Therapy

## 2019-01-09 ENCOUNTER — Encounter: Payer: HMO | Admitting: Thoracic Surgery (Cardiothoracic Vascular Surgery)

## 2019-01-09 DIAGNOSIS — R2689 Other abnormalities of gait and mobility: Secondary | ICD-10-CM | POA: Insufficient documentation

## 2019-01-09 NOTE — Pre-Procedure Instructions (Signed)
[id number] - [place], [place] [id number] [place] [id number] Phone: [phone] Fax: [phone]    Your procedure is scheduled on Tues., Oct. 13, 2020 from 12:15PM-2:15PM  Report to [facility] Entrance "A" at 10:00AM  Call this number if you have problems the morning of surgery:  [phone]   Remember:  Do not eat or drink after midnight on Oct. 12th   Take these medicines the morning of surgery with A SIP OF WATER: NONE Stop taking Eliquis on 01/09/19  As of today, stop taking all Aspirin (unless instructed by your doctor) and Other Aspirin containing products, Vitamins, Fish oils, and Herbal medications. Also stop all NSAIDS i.e. Advil, Ibuprofen, Motrin, Aleve, Anaprox, Naproxen, BC, Goody Powders, and all Supplements.  . Stop taking MetFORMIN (GLUCOPHAGE) on 01/12/19  . Do not take Empagliflozin (JARDIANCE) the morning of surgery.  How to Manage Your Diabetes Before and After Surgery  Why is it important to control my blood sugar before and after surgery? . Improving blood sugar levels before and after surgery helps healing and can limit problems. . A way of improving blood sugar control is eating a healthy diet by: o  Eating less sugar and carbohydrates o  Increasing activity/exercise o  Talking with your doctor about reaching your blood sugar goals . High blood sugars (greater than 180 mg/dL) can raise your risk of infections and slow your recovery, so you will need to focus on controlling your diabetes during the weeks before surgery. . Make sure that the doctor who takes care of your diabetes knows about your planned surgery including the date and location.  How do I manage my blood sugar before surgery? . Check your blood sugar at least 4 times a day, starting 2 days before surgery, to make sure that the level is not too high or low. o Check your blood sugar the morning of  your surgery when you wake up and every 2 hours until you get to the Short Stay unit. . If your blood sugar is less than 70 mg/dL, you will need to treat for low blood sugar: o Do not take insulin. o Treat a low blood sugar (less than 70 mg/dL) with  cup of clear juice (cranberry or apple), 4 glucose tablets, OR glucose gel. Recheck blood sugar in 15 minutes after treatment (to make sure it is greater than 70 mg/dL). If your blood sugar is not greater than 70 mg/dL on recheck, call [phone] o  for further instructions. . If your CBG is greater than 220 mg/dL, call the number above for further instructions.  . If you are admitted to the hospital after surgery: o Your blood sugar will be checked by the staff and you will probably be given insulin after surgery (instead of oral diabetes medicines) to make sure you have good blood sugar levels. o The goal for blood sugar control after surgery is 80-180 mg/dL.  Reviewed and Endorsed by [facility] Patient Education Committee, August 2015    No Tobacco or Alcohol products for 24 hours prior to your procedure.  Special instructions:  [place]- Preparing For Surgery  Before surgery, you can play an important role. Because skin is not sterile, your skin needs to be as free of germs as possible. You can reduce the number of germs on your skin by washing with CHG (chlorahexidine gluconate) Soap before surgery.  CHG is an antiseptic cleaner which kills  germs and bonds with the skin to continue killing germs even after washing.    Please do not use if you have an allergy to CHG or antibacterial soaps. If your skin becomes reddened/irritated stop using the CHG.  Do not shave (including legs and underarms) for at least 48 hours prior to first CHG shower. It is OK to shave your face.  Please follow these instructions carefully.   1. Shower the NIGHT BEFORE SURGERY and the MORNING OF SURGERY with CHG.   2. If you chose to wash your hair, wash  your hair first as usual with your normal shampoo.  3. After you shampoo, rinse your hair and body thoroughly to remove the shampoo.  4. Use CHG as you would any other liquid soap. You can apply CHG directly to the skin and wash gently with a scrungie or a clean washcloth.   5. Apply the CHG Soap to your body ONLY FROM THE NECK DOWN.  Do not use on open wounds or open sores. Avoid contact with your eyes, ears, mouth and genitals (private parts). Wash Face and genitals (private parts)  with your normal soap.  6. Wash thoroughly, paying special attention to the area where your surgery will be performed.  7. Thoroughly rinse your body with warm water from the neck down.  8. DO NOT shower/wash with your normal soap after using and rinsing off the CHG Soap.  9. Pat yourself dry with a CLEAN TOWEL.  10. Wear CLEAN PAJAMAS to bed the night before surgery, wear comfortable clothes the morning of surgery  11. Place CLEAN SHEETS on your bed the night of your first shower and DO NOT SLEEP WITH PETS.   Day of Surgery:             Remember to brush your teeth WITH YOUR REGULAR TOOTHPASTE.  Do not wear jewelry, make-up or nail polish.  Do not wear lotions, powders, or perfumes, or deodorant.  Do not shave 48 hours prior to surgery.    Do not bring valuables to the hospital.  [facility] is not responsible for any belongings or valuables.  Contacts, dentures or bridgework may not be worn into surgery.   For patients admitted to the hospital, discharge time will be determined by your treatment team.  Patients discharged the day of surgery will not be allowed to drive home, and someone age 71 and over needs to stay with them for 24 hours.  Please wear clean clothes to the hospital/surgery center.    Please read over the following fact sheets that you were given. Pain Booklet, Coughing and Deep Breathing, MRSA Information and Surgical Site Infection Prevention
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
## 2019-01-10 ENCOUNTER — Other Ambulatory Visit (HOSPITAL_COMMUNITY): Payer: HMO

## 2019-01-10 ENCOUNTER — Ambulatory Visit (HOSPITAL_COMMUNITY)
Admission: RE | Admit: 2019-01-10 | Discharge: 2019-01-10 | Disposition: A | Discharge status: Home / Self Care | Payer: HMO | Source: Ambulatory Visit | Attending: Cardiovascular Disease | Admitting: Cardiovascular Disease

## 2019-01-10 ENCOUNTER — Encounter (HOSPITAL_COMMUNITY): Payer: Self-pay

## 2019-01-10 ENCOUNTER — Encounter: Payer: Self-pay | Admitting: Thoracic Surgery (Cardiothoracic Vascular Surgery)

## 2019-01-10 ENCOUNTER — Encounter (HOSPITAL_COMMUNITY)
Admission: RE | Admit: 2019-01-10 | Discharge: 2019-01-10 | Disposition: A | Discharge status: Home / Self Care | Payer: HMO | Source: Ambulatory Visit | Attending: Cardiovascular Disease | Admitting: Cardiovascular Disease

## 2019-01-10 ENCOUNTER — Other Ambulatory Visit: Payer: Self-pay

## 2019-01-10 ENCOUNTER — Other Ambulatory Visit (HOSPITAL_COMMUNITY)
Admission: RE | Admit: 2019-01-10 | Discharge: 2019-01-10 | Disposition: A | Discharge status: Home / Self Care | Payer: HMO | Source: Ambulatory Visit | Attending: Cardiovascular Disease | Admitting: Cardiovascular Disease

## 2019-01-10 ENCOUNTER — Institutional Professional Consult (permissible substitution): Payer: HMO | Admitting: Thoracic Surgery (Cardiothoracic Vascular Surgery)

## 2019-01-10 VITALS — BP 120/80 | HR 82 | Temp 97.7°F | Resp 20 | Ht 62.0 in | Wt 241.0 lb

## 2019-01-10 DIAGNOSIS — I442 Atrioventricular block, complete: Secondary | ICD-10-CM | POA: Insufficient documentation

## 2019-01-10 DIAGNOSIS — Z01818 Encounter for other preprocedural examination: Secondary | ICD-10-CM | POA: Insufficient documentation

## 2019-01-10 DIAGNOSIS — I350 Nonrheumatic aortic (valve) stenosis: Secondary | ICD-10-CM

## 2019-01-10 DIAGNOSIS — I517 Cardiomegaly: Secondary | ICD-10-CM | POA: Diagnosis not present

## 2019-01-10 DIAGNOSIS — Z20828 Contact with and (suspected) exposure to other viral communicable diseases: Secondary | ICD-10-CM | POA: Diagnosis not present

## 2019-01-10 HISTORY — DX: Presence of cardiac pacemaker: Z95.0

## 2019-01-10 LAB — URINALYSIS, ROUTINE W REFLEX MICROSCOPIC
Bilirubin Urine: NEGATIVE
Glucose, UA: >=500 mg/dL — AB
Hgb urine dipstick: NEGATIVE
Ketones, ur: NEGATIVE mg/dL
Nitrite: NEGATIVE
Protein, ur: NEGATIVE mg/dL
Specific Gravity, Urine: 1.006 (ref 1.005–1.030)
WBC, UA: >50 WBC/hpf — ABNORMAL HIGH (ref 0–5)
pH: 5 (ref 5.0–8.0)

## 2019-01-10 LAB — COMPREHENSIVE METABOLIC PANEL
ALT: 22 U/L (ref 0–44)
AST: 25 U/L (ref 15–41)
Albumin: 3.8 g/dL (ref 3.5–5.0)
Alkaline Phosphatase: 98 U/L (ref 38–126)
Anion gap: 15 (ref 5–15)
BUN: 19 mg/dL (ref 8–23)
CO2: 20 mmol/L — ABNORMAL LOW (ref 22–32)
Calcium: 9.7 mg/dL (ref 8.9–10.3)
Chloride: 103 mmol/L (ref 98–111)
Creatinine, Ser: 0.94 mg/dL (ref 0.44–1.00)
GFR calc Af Amer: >60 mL/min (ref >60)
GFR calc non Af Amer: 56 mL/min — ABNORMAL LOW (ref >60)
Glucose, Bld: 138 mg/dL — ABNORMAL HIGH (ref 70–99)
Potassium: 4.1 mmol/L (ref 3.5–5.1)
Sodium: 138 mmol/L (ref 135–145)
Total Bilirubin: 0.7 mg/dL (ref 0.3–1.2)
Total Protein: 8.4 g/dL — ABNORMAL HIGH (ref 6.5–8.1)

## 2019-01-10 LAB — ABO/RH: ABO/RH(D): B POS

## 2019-01-10 LAB — CBC
HCT: 47.1 % — ABNORMAL HIGH (ref 36.0–46.0)
Hemoglobin: 15.4 g/dL — ABNORMAL HIGH (ref 12.0–15.0)
MCH: 31.7 pg (ref 26.0–34.0)
MCHC: 32.7 g/dL (ref 30.0–36.0)
MCV: 96.9 fL (ref 80.0–100.0)
Platelets: 291 10*3/uL (ref 150–400)
RBC: 4.86 MIL/uL (ref 3.87–5.11)
RDW: 13.3 % (ref 11.5–15.5)
WBC: 7.5 10*3/uL (ref 4.0–10.5)
nRBC: 0 % (ref 0.0–0.2)

## 2019-01-10 LAB — BLOOD GAS, ARTERIAL
Acid-base deficit: 1.3 mmol/L (ref 0.0–2.0)
Bicarbonate: 22.4 mmol/L (ref 20.0–28.0)
Drawn by: 421801
FIO2: 21
O2 Saturation: 95.7 %
Patient temperature: 98.6
pCO2 arterial: 34.3 mmHg (ref 32.0–48.0)
pH, Arterial: 7.43 (ref 7.350–7.450)
pO2, Arterial: 80 mmHg — ABNORMAL LOW (ref 83.0–108.0)

## 2019-01-10 LAB — TYPE AND SCREEN
ABO/RH(D): B POS
Antibody Screen: NEGATIVE

## 2019-01-10 LAB — HEMOGLOBIN A1C
Hgb A1c MFr Bld: 7.3 % — ABNORMAL HIGH (ref 4.8–5.6)
Mean Plasma Glucose: 162.81 mg/dL

## 2019-01-10 LAB — SURGICAL PCR SCREEN
MRSA, PCR: NEGATIVE
Staphylococcus aureus: POSITIVE — AB

## 2019-01-10 LAB — GLUCOSE, CAPILLARY: Glucose-Capillary: 121 mg/dL — ABNORMAL HIGH (ref 70–99)

## 2019-01-10 LAB — PROTIME-INR
INR: 1.2 (ref 0.8–1.2)
Prothrombin Time: 14.8 seconds (ref 11.4–15.2)

## 2019-01-10 LAB — BRAIN NATRIURETIC PEPTIDE: B Natriuretic Peptide: 142.4 pg/mL — ABNORMAL HIGH (ref 0.0–100.0)

## 2019-01-10 LAB — APTT: aPTT: 31 seconds (ref 24–36)

## 2019-01-10 NOTE — Progress Notes (Signed)
Mupirocin Ointment Rx called into [facility] on [address] for positive PCR of Staph. Pt notified of results and need to pick up Rx. She voiced understanding.

## 2019-01-10 NOTE — Patient Instructions (Signed)
Do not take Eliquis.  Stop taking metformin 2 days prior to surgery.  Continue taking all other medications without change through the day before surgery.  Have nothing to eat or drink after midnight the night before surgery.  On the morning of surgery take only Synthroid with a sip of water.

## 2019-01-10 NOTE — Progress Notes (Signed)
PCP - Dr. Alain Marion Cardiologist - Dr. Oval Linsey EP - Dr. Rayann Heman --> Patient has PPM, [place] and Erlene Quan emailed; [facility] physician orders faxed to the device clinic 01/10/2019; Last device check was in 07/2018  Chest x-ray - 01/10/2019 EKG - 01/10/2019 Stress Test - patient denies ECHO - 12/12/2018 Cardiac Cath - 12/30/2018  Sleep Study - patient denies CPAP -   Fasting Blood Sugar - 100's Checks Blood Sugar 1 time a day  Blood Thinner Instructions: stopped Eliquis 01/08/2019 Aspirin Instructions: n/a  Anesthesia review: yes, cardiac history  Patient denies shortness of breath, fever, cough and chest pain at PAT appointment   Patient verbalized understanding of instructions that were given to them at the PAT appointment. Patient was also instructed that they will need to review over the PAT instructions again at home before surgery.

## 2019-01-10 NOTE — Progress Notes (Signed)
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  Diagnostic Dominance: Right Left Anterior Descending  Vessel is large.  Left Circumflex  Vessel is moderate in size.  Right Coronary Artery  Vessel is large.  Prox RCA lesion 40% stenosed  Prox RCA lesion is 40% stenosed.  Dist RCA lesion 30% stenosed  Dist RCA lesion is 30% stenosed.  Intervention  No interventions have been documented. Coronary Diagrams  Diagnostic Dominance: Right  Intervention  Implants   Vascular Products  Closure Mynx Control [id number]-[id number]- Wasted Inventory item: CLOSURE [facility]ONTROL 81F Model/Cat number: [id number] Manufacturer: CRungeLot number: [id number] Device identifier: [id number] identifier type: GS1  GUDID  Information  Request status Successful    Brand name: [facility]ON[facility]OL Version/Model: [id number] Company name: ACamden Pointsafety info as of 12/30/18: MR Safe  Contains dry or latex rubber: No    GMDN P.T. name: Wound hydrogel dressing, non-antimicrobial    As of 12/30/2018  Status: Wasted      Closure Mynx Control 578f [id number] Implanted Inventory item: CLOSURE [facility] Hospital[facility]TR[facility] 81F Model/Cat number: [id number]: COMainvilleot number: [id number] identifier: [id number] identifier type: GS1  GUDID Information  Request status Successful    Brand name: MYNX CONTROL Version/Model: [id number] name: AcNew Fairviewafety info as of 12/30/18: MR Safe  Contains dry or latex rubber: No    GMDN P.T. name: Wound hydrogel dressing, non-antimicrobial    As of 12/30/2018  Status: Implanted      Syngo Images  Show images for CARDIAC CATHETERIZATION  Images on Long Term Storage  Show images for [place], [place] Procedure Log  Procedure Log    Hemo Data   Most Recent Value  Fick Cardiac Output 4.21 L/min  Fick Cardiac Output Index 2.02 (L/min)/BSA  Aortic Mean Gradient 35.04 mmHg  Aortic Peak Gradient 44 mmHg  Aortic Valve Area 0.78  Aortic Value Area Index 0.38 cm2/BSA  RA A Wave 1 mmHg  RA V Wave 0 mmHg  RA Mean 4 mmHg  RV Systolic Pressure 28 mmHg  RV Diastolic Pressure -1 mmHg  RV EDP 5 mmHg  PA Systolic Pressure 31 mmHg  PA Diastolic Pressure 12 mmHg  PA Mean 18 mmHg  PW A Wave 13 mmHg  PW V Wave 29 mmHg  PW Mean 17 mmHg  AO Systolic Pressure 12681mHg  AO Diastolic Pressure 62 mmHg  AO Mean 85 mmHg  LV Systolic Pressure 11157mHg  LV Diastolic Pressure 4 mmHg  LV EDP 6 mmHg  AOp Systolic Pressure 84 mmHg  AOp Diastolic Pressure 47 mmHg  AOp Mean Pressure 60 mmHg  LVp Systolic Pressure 12262mHg  LVp Diastolic Pressure 5 mmHg  LVp EDP Pressure 7 mmHg  QP/QS 1  TPVR Index 8.9 HRUI  TSVR Index  42.05 HRUI  PVR SVR Ratio 0.01  TPVR/TSVR Ratio 0.21    Cardiac TAVR CT  TECHNIQUE: The patient was scanned on a PhGraybar ElectricA 120 kV retrospective scan was triggered in the descending thoracic aorta at 111 HU's. Gantry rotation speed was 250 msecs and collimation was .6 mm. No beta blockade or nitro were given. The 3D data set  was reconstructed in 5% intervals of the R-R cycle. Systolic and diastolic phases were analyzed on a dedicated work station using MPR, MIP and VRT modes. The patient received 80 cc of contrast.  FINDINGS: Aortic Root:  Aortic valve: Trileaflet  Aortic valve calcium score: 2250  Aortic annulus:  Diameter: 86m x 274m Perimeter: 8133mArea: 496 mm^2  Calcifications: No calcifications  Coronary height: Min Left - 1m51max Left - 21mm14mn Right - 13mm 39motubular height: Left cusp - 23mm; 74mt cusp - 21mm; N10mronary cusp - 22mm  LV40mas measured 3 mm below the annulus):  Diameter: 30mm x 2345mArea41m8 mm^2  Calcifications: No calcifications  Aortic sinus width: Left cusp - 34mm; Right12mp - 35mm; Noncor40my cusp - 35mm  Sinotub32m junction width: 31mm x 29mm  O55mum F84moscopic Angle for Delivery (centered on right coronary cusp): LAO 1 CRA 3  Cardiac:  Right atrium: Normal size  Right ventricle: Normal size  Pulmonary arteries: Normal size  Pulmonary veins: Normal configuration  Left atrium: Moderate enlargement  Left ventricle: Normal size  Pericardium: Normal thickness  Coronary arteries: Moderate calcifications  IMPRESSION: 1.  Severely calcified trileaflet aortic valve (calcium score 2250)  2. Aortic annulus measures 29mm x 24mm with66mimet72m1mm and area 496 39m. No annular or LVOT calcifications  3. Sufficient coronary to annulus distance: 1mm to left main, 65m to right corona67mrtery  4. Optimum Fluoroscopic Angle for Delivery (centered on  right coronary cusp): LAO 1 CRA 3   Electronically Signed   By: Christopher  SchumannOswaldo Milian22:52    CT ANGIOGRAPHY CHEST, ABDOMEN AND PELVIS  TECHNIQUE: Multidetector CT imaging through the chest, abdomen and pelvis was performed using the standard protocol during bolus administration of intravenous contrast. Multiplanar reconstructed images and MIPs were obtained and reviewed to evaluate the vascular anatomy.  CONTRAST:  100mL OMNIPAQUE IOHEXO75m0 MG/ML SOLN  COMPARISON:  CT the abdomen and pelvis 08/03/2005.  FINDINGS: CTA CHEST FINDINGS  Cardiovascular: Heart size is mildly enlarged. There is no significant pericardial fluid, thickening or pericardial calcification. There is aortic atherosclerosis, as well as atherosclerosis of the great vessels of the mediastinum and the coronary arteries, including calcified atherosclerotic plaque in the left main, left anterior descending and right coronary arteries. Severe thickening calcification of the aortic valve and mitral annulus.  Mediastinum/Lymph Nodes: No pathologically enlarged mediastinal or hilar lymph nodes. Please note that accurate exclusion of hilar adenopathy is limited on noncontrast CT scans. Large hiatal hernia. No axillary lymphadenopathy.  Lungs/Pleura: No acute consolidative airspace disease. No pleural effusions. In the inferior segment of the lingula there is a 9 x 7 mm (mean diameter of 8 mm) pulmonary nodule (axial image 63 of series 17).  Musculoskeletal/Soft Tissues: There are no aggressive appearing lytic or blastic lesions noted in the visualized portions of the skeleton.  CTA ABDOMEN AND PELVIS FINDINGS  Hepatobiliary: No suspicious cystic or solid hepatic lesions. No intrahepatic biliary ductal dilatation. Gallbladder is not visualized, presumably surgically absent. No surgical clips are noted in the gallbladder fossa. Common bile duct is dilated up to 11 mm in  the porta hepatis, likely reflective of benign post cholecystectomy physiology.  Pancreas: No pancreatic mass. No pancreatic ductal dilatation. No pancreatic or peripancreatic fluid collections or inflammatory changes.  Spleen: Unremarkable.  Adrenals/Urinary Tract: Multiple subcentimeter low-attenuation lesions in the left kidney, too small to characterize, but statistically likely to represent tiny cysts. Right kidney and bilateral  adrenal glands are normal in appearance. No hydroureteronephrosis. Urinary bladder is normal in appearance.  Stomach/Bowel: Intra-abdominal portion of the stomach is normal. No pathologic dilatation of small bowel or colon. Numerous colonic diverticulae are noted, without surrounding inflammatory changes to suggest an acute diverticulitis at this time. The appendix is not confidently identified and may be surgically absent. Regardless, there are no inflammatory changes noted adjacent to the cecum to suggest the presence of an acute appendicitis at this time.  Vascular/Lymphatic: Aortic atherosclerosis, without evidence of aneurysm or dissection in the abdominal or pelvic vasculature. No lymphadenopathy noted in the abdomen or pelvis.  Reproductive: 1.9 x 1.2 cm peripherally calcified lesion in the right-side of the uterine body, likely a calcified fibroid. Ovaries are unremarkable in appearance.  Other: No significant volume of ascites.  No pneumoperitoneum.  Musculoskeletal: There are no aggressive appearing lytic or blastic lesions noted in the visualized portions of the skeleton.  VASCULAR MEASUREMENTS PERTINENT TO TAVR:  AORTA:  Minimal Aortic Diameter-11 x 10 mm  Severity of Aortic Calcification-severe  RIGHT PELVIS:  Right Common Iliac Artery -  Minimal Diameter-2.3 x 8.7 mm  Tortuosity-mild  Calcification-moderate  Right External Iliac Artery -  Minimal Diameter-7.1 x 5.9 mm  Tortuosity -  mild  Calcification-mild  Right Common Femoral Artery -  Minimal Diameter-6.2 x 6.1 mm  Tortuosity - mild  Calcification-mild  LEFT PELVIS:  Left Common Iliac Artery -  Minimal Diameter-6.1 x 7.7 mm  Tortuosity - mild  Calcification-moderate  Left External Iliac Artery -  Minimal Diameter-6.8 x 7.2 mm  Tortuosity - mild  Calcification-none  Left Common Femoral Artery -  Minimal Diameter-6.4 x 6.3 mm  Tortuosity - mild  Calcification-none  Review of the MIP images confirms the above findings.  IMPRESSION: 1. Vascular findings and measurements pertinent to potential TAVR procedure, as detailed above. 2. Severe thickening calcification of the aortic valve, compatible with the reported clinical history of severe aortic stenosis. 3. Aortic atherosclerosis, in addition to left main and 2 vessel coronary artery disease. 4. Nodule in the inferior segment of the lingula with a mean diameter of 8 mm. Non-contrast chest CT at 6-12 months is recommended. If the nodule is stable at time of repeat CT, then future CT at 18-24 months (from today's scan) is considered optional for low-risk patients, but is recommended for high-risk patients. This recommendation follows the consensus statement: Guidelines for Management of Incidental Pulmonary Nodules Detected on CT Images: From the Fleischner Society 2017; Radiology 2017; 284:228-243. 5. Severe colonic diverticulosis without evidence of acute diverticulitis at this time. 6. Additional incidental findings, as above.   Electronically Signed   By: Vinnie Langton M.D.   On: 01/07/2019 14:37    Impression:  Patient has stage D severe symptomatic aortic stenosis.  She presents with slow gradual progressive symptoms of exertional shortness of breath and fatigue consistent with chronic diastolic congestive heart failure, New York Heart Association functional class IIb.  I personally reviewed the  patient's recent transthoracic echocardiogram, diagnostic cardiac catheterization, and CT angiograms.  Echocardiogram reveals normal left ventricular systolic function with moderate left ventricular hypertrophy.  There is severe aortic stenosis.  The aortic valve is trileaflet with severe thickening, calcification, and restricted leaflet mobility involving all 3 leaflets.  Peak velocity across aortic valve measured as high as 3.8 m/s corresponding to mean transvalvular gradient estimated greater than 34 mmHg with aortic valve area calculated only 0.71 cm and DVI reported 0.22.  Diagnostic cardiac catheterization confirmed the presence of  aortic stenosis with peak to peak and mean transvalvular gradients measured 44 and 35 mmHg respectively and aortic valve area calculated 0.78 cm.  The patient was noted to have mild nonobstructive coronary artery disease and normal right heart pressures.  I agree the patient would benefit from aortic valve replacement.  Risks associated with conventional surgery would be somewhat elevated because of the patient's advanced age and comorbid medical problems.  Cardiac-gated CTA of the heart reveals anatomical characteristics consistent with aortic stenosis suitable for treatment by transcatheter aortic valve replacement without any significant complicating features and CTA of the aorta and iliac vessels demonstrate what appears to be adequate pelvic vascular access to facilitate a transfemoral approach.    Plan:  The patient was counseled at length regarding treatment alternatives for management of severe symptomatic aortic stenosis. Alternative approaches such as conventional aortic valve replacement, transcatheter aortic valve replacement, and continued medical therapy without intervention were compared and contrasted at length.  The risks associated with conventional surgical aortic valve replacement were discussed in detail, as were expectations for post-operative  convalescence, and why I would be reluctant to consider this patient a candidate for conventional surgery.  Issues specific to transcatheter aortic valve replacement were discussed including questions about long term valve durability, the potential for paravalvular leak, possible increased risk of need for permanent pacemaker placement, and other technical complications related to the procedure itself.  Long-term prognosis with medical therapy was discussed. This discussion was placed in the context of the patient's own specific clinical presentation and past medical history.  All of their questions have been addressed.  The patient desires to proceed with transcatheter aortic valve replacement in the near future.  She would not wish to consider open cardiac surgery under any circumstances.  Following the decision to proceed with transcatheter aortic valve replacement, a discussion has been held regarding what types of management strategies would be attempted intraoperatively in the event of life-threatening complications, including whether or not the patient would be considered a candidate for the use of cardiopulmonary bypass and/or conversion to open sternotomy for attempted surgical intervention.  This patient specifically states that she would not wish to undergo emergent median sternotomy for salvage under any circumstances.  The patient has been advised of a variety of complications that might develop including but not limited to risks of death, stroke, paravalvular leak, aortic dissection or other major vascular complications, aortic annulus rupture, device embolization, cardiac rupture or perforation, mitral regurgitation, acute myocardial infarction, arrhythmia, heart block or bradycardia requiring permanent pacemaker placement, congestive heart failure, respiratory failure, renal failure, pneumonia, infection, other late complications related to structural valve deterioration or migration, or other  complications that might ultimately cause a temporary or permanent loss of functional independence or other long term morbidity.  The patient provides full informed consent for the procedure as described and all questions were answered.    I spent in excess of 90 minutes during the conduct of this office consultation and >50% of this time involved direct face-to-face encounter with the patient for counseling and/or coordination of their care.      Valentina Gu. Roxy Manns, MD 01/10/2019 12:21 PM

## 2019-01-13 ENCOUNTER — Ambulatory Visit: Payer: Self-pay | Admitting: Pharmacist

## 2019-01-13 LAB — NOVEL CORONAVIRUS, NAA (HOSP ORDER, SEND-OUT TO REF LAB; TAT 18-24 HRS): SARS-CoV-2, NAA: NOT DETECTED

## 2019-01-13 MED ORDER — POTASSIUM CHLORIDE 2 MEQ/ML IV SOLN
80.0000 meq | INTRAVENOUS | Status: DC
  Filled 2019-01-13: qty 40

## 2019-01-13 MED ORDER — NOREPINEPHRINE 4 MG/250ML-% IV SOLN
0.0000 ug/min | INTRAVENOUS | Status: AC
  Administered 2019-01-14: 14:00:00 1 ug/min via INTRAVENOUS
  Filled 2019-01-13: qty 250

## 2019-01-13 MED ORDER — VANCOMYCIN HCL 10 G IV SOLR
1500.0000 mg | INTRAVENOUS | Status: AC
  Administered 2019-01-14: 13:00:00 1500 mg via INTRAVENOUS
  Filled 2019-01-13: qty 1500

## 2019-01-13 MED ORDER — DEXMEDETOMIDINE HCL IN NACL 400 MCG/100ML IV SOLN
0.1000 ug/kg/h | INTRAVENOUS | Status: AC
  Administered 2019-01-14: 13:00:00 1 ug/kg/h via INTRAVENOUS
  Filled 2019-01-13 (×2): qty 100

## 2019-01-13 MED ORDER — SODIUM CHLORIDE 0.9 % IV SOLN
1.5000 g | INTRAVENOUS | Status: AC
  Administered 2019-01-14: 1.5 g via INTRAVENOUS
  Filled 2019-01-13: qty 1.5

## 2019-01-13 MED ORDER — SODIUM CHLORIDE 0.9 % IV SOLN
INTRAVENOUS | Status: DC
  Filled 2019-01-13: qty 30

## 2019-01-13 MED ORDER — MAGNESIUM SULFATE 50 % IJ SOLN
40.0000 meq | INTRAMUSCULAR | Status: DC
  Filled 2019-01-13: qty 9.85

## 2019-01-14 ENCOUNTER — Inpatient Hospital Stay (HOSPITAL_COMMUNITY): Payer: HMO | Admitting: Certified Registered Nurse Anesthetist

## 2019-01-14 ENCOUNTER — Inpatient Hospital Stay (HOSPITAL_COMMUNITY): Payer: HMO

## 2019-01-14 ENCOUNTER — Other Ambulatory Visit: Payer: Self-pay

## 2019-01-14 ENCOUNTER — Other Ambulatory Visit: Payer: Self-pay | Admitting: Physician Assistant

## 2019-01-14 ENCOUNTER — Encounter (HOSPITAL_COMMUNITY): Payer: Self-pay

## 2019-01-14 ENCOUNTER — Encounter (HOSPITAL_COMMUNITY)
Admission: RE | Disposition: A | Discharge status: Home / Self Care | Payer: Self-pay | Source: Home / Self Care | Attending: Cardiovascular Disease

## 2019-01-14 ENCOUNTER — Inpatient Hospital Stay (HOSPITAL_COMMUNITY): Payer: HMO | Admitting: Physician Assistant

## 2019-01-14 ENCOUNTER — Inpatient Hospital Stay (HOSPITAL_COMMUNITY)
Admission: RE | Admit: 2019-01-14 | Discharge: 2019-01-15 | DRG: 266 | Disposition: A | Discharge status: Home / Self Care | Payer: HMO | Attending: Cardiovascular Disease | Admitting: Cardiovascular Disease

## 2019-01-14 DIAGNOSIS — Z006 Encounter for examination for normal comparison and control in clinical research program: Secondary | ICD-10-CM | POA: Diagnosis not present

## 2019-01-14 DIAGNOSIS — E1142 Type 2 diabetes mellitus with diabetic polyneuropathy: Secondary | ICD-10-CM | POA: Diagnosis present

## 2019-01-14 DIAGNOSIS — E785 Hyperlipidemia, unspecified: Secondary | ICD-10-CM | POA: Diagnosis not present

## 2019-01-14 DIAGNOSIS — Z950 Presence of cardiac pacemaker: Secondary | ICD-10-CM | POA: Diagnosis present

## 2019-01-14 DIAGNOSIS — I4892 Unspecified atrial flutter: Secondary | ICD-10-CM | POA: Diagnosis present

## 2019-01-14 DIAGNOSIS — Z823 Family history of stroke: Secondary | ICD-10-CM

## 2019-01-14 DIAGNOSIS — Z952 Presence of prosthetic heart valve: Secondary | ICD-10-CM

## 2019-01-14 DIAGNOSIS — Z6841 Body Mass Index (BMI) 40.0 and over, adult: Secondary | ICD-10-CM | POA: Diagnosis not present

## 2019-01-14 DIAGNOSIS — K219 Gastro-esophageal reflux disease without esophagitis: Secondary | ICD-10-CM | POA: Diagnosis not present

## 2019-01-14 DIAGNOSIS — Z86718 Personal history of other venous thrombosis and embolism: Secondary | ICD-10-CM

## 2019-01-14 DIAGNOSIS — Z86711 Personal history of pulmonary embolism: Secondary | ICD-10-CM | POA: Diagnosis not present

## 2019-01-14 DIAGNOSIS — I5033 Acute on chronic diastolic (congestive) heart failure: Secondary | ICD-10-CM | POA: Diagnosis not present

## 2019-01-14 DIAGNOSIS — I441 Atrioventricular block, second degree: Secondary | ICD-10-CM | POA: Diagnosis not present

## 2019-01-14 DIAGNOSIS — E118 Type 2 diabetes mellitus with unspecified complications: Secondary | ICD-10-CM | POA: Diagnosis present

## 2019-01-14 DIAGNOSIS — I872 Venous insufficiency (chronic) (peripheral): Secondary | ICD-10-CM | POA: Diagnosis not present

## 2019-01-14 DIAGNOSIS — Z8249 Family history of ischemic heart disease and other diseases of the circulatory system: Secondary | ICD-10-CM

## 2019-01-14 DIAGNOSIS — Z7901 Long term (current) use of anticoagulants: Secondary | ICD-10-CM | POA: Diagnosis not present

## 2019-01-14 DIAGNOSIS — I5043 Acute on chronic combined systolic (congestive) and diastolic (congestive) heart failure: Secondary | ICD-10-CM | POA: Diagnosis not present

## 2019-01-14 DIAGNOSIS — I350 Nonrheumatic aortic (valve) stenosis: Secondary | ICD-10-CM | POA: Diagnosis not present

## 2019-01-14 DIAGNOSIS — I5032 Chronic diastolic (congestive) heart failure: Secondary | ICD-10-CM | POA: Diagnosis present

## 2019-01-14 DIAGNOSIS — I110 Hypertensive heart disease with heart failure: Secondary | ICD-10-CM | POA: Diagnosis present

## 2019-01-14 DIAGNOSIS — Z79890 Hormone replacement therapy (postmenopausal): Secondary | ICD-10-CM

## 2019-01-14 DIAGNOSIS — F419 Anxiety disorder, unspecified: Secondary | ICD-10-CM | POA: Diagnosis present

## 2019-01-14 DIAGNOSIS — Z7984 Long term (current) use of oral hypoglycemic drugs: Secondary | ICD-10-CM

## 2019-01-14 DIAGNOSIS — R911 Solitary pulmonary nodule: Secondary | ICD-10-CM | POA: Diagnosis present

## 2019-01-14 DIAGNOSIS — I480 Paroxysmal atrial fibrillation: Secondary | ICD-10-CM | POA: Diagnosis not present

## 2019-01-14 DIAGNOSIS — I4891 Unspecified atrial fibrillation: Secondary | ICD-10-CM | POA: Diagnosis present

## 2019-01-14 DIAGNOSIS — K449 Diaphragmatic hernia without obstruction or gangrene: Secondary | ICD-10-CM | POA: Diagnosis not present

## 2019-01-14 DIAGNOSIS — Z807 Family history of other malignant neoplasms of lymphoid, hematopoietic and related tissues: Secondary | ICD-10-CM

## 2019-01-14 DIAGNOSIS — E039 Hypothyroidism, unspecified: Secondary | ICD-10-CM | POA: Diagnosis not present

## 2019-01-14 DIAGNOSIS — Z79899 Other long term (current) drug therapy: Secondary | ICD-10-CM

## 2019-01-14 DIAGNOSIS — I10 Essential (primary) hypertension: Secondary | ICD-10-CM | POA: Diagnosis present

## 2019-01-14 DIAGNOSIS — F329 Major depressive disorder, single episode, unspecified: Secondary | ICD-10-CM | POA: Diagnosis present

## 2019-01-14 HISTORY — DX: Presence of prosthetic heart valve: Z95.2

## 2019-01-14 HISTORY — DX: Chronic diastolic (congestive) heart failure: I50.32

## 2019-01-14 HISTORY — PX: TEE WITHOUT CARDIOVERSION: SHX5443

## 2019-01-14 HISTORY — PX: TRANSCATHETER AORTIC VALVE REPLACEMENT, TRANSFEMORAL: SHX6400

## 2019-01-14 HISTORY — DX: Solitary pulmonary nodule: R91.1

## 2019-01-14 LAB — POCT I-STAT, CHEM 8
BUN: 25 mg/dL — ABNORMAL HIGH (ref 8–23)
BUN: 24 mg/dL — ABNORMAL HIGH (ref 8–23)
BUN: 24 mg/dL — ABNORMAL HIGH (ref 8–23)
BUN: 23 mg/dL (ref 8–23)
Calcium, Ion: 1.26 mmol/L (ref 1.15–1.40)
Calcium, Ion: 1.21 mmol/L (ref 1.15–1.40)
Calcium, Ion: 1.23 mmol/L (ref 1.15–1.40)
Calcium, Ion: 1.26 mmol/L (ref 1.15–1.40)
Chloride: 106 mmol/L (ref 98–111)
Chloride: 107 mmol/L (ref 98–111)
Chloride: 108 mmol/L (ref 98–111)
Chloride: 107 mmol/L (ref 98–111)
Creatinine, Ser: 0.7 mg/dL (ref 0.44–1.00)
Creatinine, Ser: 0.7 mg/dL (ref 0.44–1.00)
Creatinine, Ser: 0.8 mg/dL (ref 0.44–1.00)
Creatinine, Ser: 0.8 mg/dL (ref 0.44–1.00)
Glucose, Bld: 128 mg/dL — ABNORMAL HIGH (ref 70–99)
Glucose, Bld: 158 mg/dL — ABNORMAL HIGH (ref 70–99)
Glucose, Bld: 177 mg/dL — ABNORMAL HIGH (ref 70–99)
Glucose, Bld: 174 mg/dL — ABNORMAL HIGH (ref 70–99)
HCT: 42 % (ref 36.0–46.0)
HCT: 38 % (ref 36.0–46.0)
HCT: 39 % (ref 36.0–46.0)
HCT: 40 % (ref 36.0–46.0)
Hemoglobin: 14.3 g/dL (ref 12.0–15.0)
Hemoglobin: 12.9 g/dL (ref 12.0–15.0)
Hemoglobin: 13.3 g/dL (ref 12.0–15.0)
Hemoglobin: 13.6 g/dL (ref 12.0–15.0)
Potassium: 3.9 mmol/L (ref 3.5–5.1)
Potassium: 3.9 mmol/L (ref 3.5–5.1)
Potassium: 3.9 mmol/L (ref 3.5–5.1)
Potassium: 3.8 mmol/L (ref 3.5–5.1)
Sodium: 142 mmol/L (ref 135–145)
Sodium: 141 mmol/L (ref 135–145)
Sodium: 141 mmol/L (ref 135–145)
Sodium: 141 mmol/L (ref 135–145)
TCO2: 23 mmol/L (ref 22–32)
TCO2: 22 mmol/L (ref 22–32)
TCO2: 22 mmol/L (ref 22–32)
TCO2: 22 mmol/L (ref 22–32)

## 2019-01-14 LAB — GLUCOSE, CAPILLARY
Glucose-Capillary: 166 mg/dL — ABNORMAL HIGH (ref 70–99)
Glucose-Capillary: 122 mg/dL — ABNORMAL HIGH (ref 70–99)
Glucose-Capillary: 201 mg/dL — ABNORMAL HIGH (ref 70–99)

## 2019-01-14 LAB — POCT ACTIVATED CLOTTING TIME
Activated Clotting Time: 125 seconds
Activated Clotting Time: 109 seconds
Activated Clotting Time: 285 seconds

## 2019-01-14 SURGERY — IMPLANTATION, AORTIC VALVE, TRANSCATHETER, FEMORAL APPROACH
Anesthesia: Monitor Anesthesia Care

## 2019-01-14 MED ORDER — TEMAZEPAM 15 MG PO CAPS
15.0000 mg | ORAL_CAPSULE | Freq: Every day | ORAL | Status: DC
  Administered 2019-01-14: 15 mg via ORAL
  Filled 2019-01-14: qty 1

## 2019-01-14 MED ORDER — FENTANYL CITRATE (PF) 100 MCG/2ML IJ SOLN
INTRAMUSCULAR | Status: AC
  Filled 2019-01-14: qty 2

## 2019-01-14 MED ORDER — CHLORHEXIDINE GLUCONATE 4 % EX LIQD: 60.0000 mL | Freq: Once | CUTANEOUS | Status: DC

## 2019-01-14 MED ORDER — ALLOPURINOL 100 MG PO TABS
100.0000 mg | ORAL_TABLET | Freq: Every day | ORAL | Status: DC
  Administered 2019-01-15: 100 mg via ORAL
  Filled 2019-01-14: qty 1

## 2019-01-14 MED ORDER — SODIUM CHLORIDE 0.9 % IV SOLN
INTRAVENOUS | Status: DC
  Administered 2019-01-14: 11:00:00 via INTRAVENOUS

## 2019-01-14 MED ORDER — HEPARIN SODIUM (PORCINE) 1000 UNIT/ML IJ SOLN
INTRAMUSCULAR | Status: DC | PRN
  Administered 2019-01-14: 16000 [IU] via INTRAVENOUS

## 2019-01-14 MED ORDER — MUPIROCIN 2 % EX OINT
TOPICAL_OINTMENT | CUTANEOUS | Status: AC
  Administered 2019-01-14: 1 via TOPICAL
  Filled 2019-01-14: qty 22

## 2019-01-14 MED ORDER — TRAMADOL HCL 50 MG PO TABS: 50.0000 mg | ORAL_TABLET | ORAL | Status: DC | PRN

## 2019-01-14 MED ORDER — ACETAMINOPHEN 650 MG RE SUPP: 650.0000 mg | Freq: Four times a day (QID) | RECTAL | Status: DC | PRN

## 2019-01-14 MED ORDER — LORAZEPAM 0.5 MG PO TABS
0.5000 mg | ORAL_TABLET | Freq: Two times a day (BID) | ORAL | Status: DC
  Administered 2019-01-14 – 2019-01-15 (×2): 0.5 mg via ORAL
  Filled 2019-01-14 (×2): qty 1

## 2019-01-14 MED ORDER — INSULIN ASPART 100 UNIT/ML ~~LOC~~ SOLN
0.0000 [IU] | Freq: Three times a day (TID) | SUBCUTANEOUS | Status: DC
  Administered 2019-01-14: 8 [IU] via SUBCUTANEOUS
  Administered 2019-01-15: 4 [IU] via SUBCUTANEOUS
  Administered 2019-01-15: 2 [IU] via SUBCUTANEOUS

## 2019-01-14 MED ORDER — OXYCODONE HCL 5 MG PO TABS
5.0000 mg | ORAL_TABLET | ORAL | Status: DC | PRN
  Administered 2019-01-14: 5 mg via ORAL
  Filled 2019-01-14: qty 1

## 2019-01-14 MED ORDER — CHLORHEXIDINE GLUCONATE CLOTH 2 % EX PADS
6.0000 | MEDICATED_PAD | Freq: Every day | CUTANEOUS | Status: DC
  Administered 2019-01-14 – 2019-01-15 (×2): 6 via TOPICAL

## 2019-01-14 MED ORDER — CHLORHEXIDINE GLUCONATE 0.12 % MT SOLN
15.0000 mL | Freq: Once | OROMUCOSAL | Status: AC
  Administered 2019-01-14: 15 mL via OROMUCOSAL
  Filled 2019-01-14: qty 15

## 2019-01-14 MED ORDER — ACETAMINOPHEN 325 MG PO TABS: 650.0000 mg | ORAL_TABLET | Freq: Four times a day (QID) | ORAL | Status: DC | PRN

## 2019-01-14 MED ORDER — SODIUM CHLORIDE 0.9 % IV BOLUS
500.0000 mL | Freq: Once | INTRAVENOUS | Status: AC
  Administered 2019-01-14: 500 mL via INTRAVENOUS

## 2019-01-14 MED ORDER — PROTAMINE SULFATE 10 MG/ML IV SOLN
INTRAVENOUS | Status: DC | PRN
  Administered 2019-01-14: 160 mg via INTRAVENOUS

## 2019-01-14 MED ORDER — LACTATED RINGERS IV SOLN
INTRAVENOUS | Status: DC | PRN
  Administered 2019-01-14: 12:00:00 via INTRAVENOUS

## 2019-01-14 MED ORDER — FENTANYL CITRATE (PF) 100 MCG/2ML IJ SOLN
INTRAMUSCULAR | Status: DC | PRN
  Administered 2019-01-14: 50 ug via INTRAVENOUS

## 2019-01-14 MED ORDER — HEPARIN (PORCINE) IN NACL 1000-0.9 UT/500ML-% IV SOLN
INTRAVENOUS | Status: AC
  Filled 2019-01-14: qty 500

## 2019-01-14 MED ORDER — SODIUM CHLORIDE 0.9 % IV SOLN
1.5000 g | Freq: Two times a day (BID) | INTRAVENOUS | Status: DC
  Administered 2019-01-14 – 2019-01-15 (×2): 1.5 g via INTRAVENOUS
  Filled 2019-01-14 (×3): qty 1.5

## 2019-01-14 MED ORDER — NITROGLYCERIN IN D5W 200-5 MCG/ML-% IV SOLN: 0.0000 ug/min | INTRAVENOUS | Status: DC

## 2019-01-14 MED ORDER — ONDANSETRON HCL 4 MG/2ML IJ SOLN
INTRAMUSCULAR | Status: DC | PRN
  Administered 2019-01-14: 4 mg via INTRAVENOUS

## 2019-01-14 MED ORDER — ONDANSETRON HCL 4 MG/2ML IJ SOLN: 4.0000 mg | Freq: Once | INTRAMUSCULAR | Status: DC | PRN

## 2019-01-14 MED ORDER — LIDOCAINE HCL (PF) 1 % IJ SOLN
INTRAMUSCULAR | Status: DC | PRN
  Administered 2019-01-14 (×2): 10 mL via INTRADERMAL

## 2019-01-14 MED ORDER — MORPHINE SULFATE (PF) 2 MG/ML IV SOLN
1.0000 mg | INTRAVENOUS | Status: DC | PRN
  Administered 2019-01-15: 2 mg via INTRAVENOUS
  Filled 2019-01-14: qty 1

## 2019-01-14 MED ORDER — HEPARIN (PORCINE) IN NACL 1000-0.9 UT/500ML-% IV SOLN
INTRAVENOUS | Status: DC | PRN
  Administered 2019-01-14 (×3): 500 mL

## 2019-01-14 MED ORDER — IOHEXOL 350 MG/ML SOLN
INTRAVENOUS | Status: DC | PRN
  Administered 2019-01-14: 14:00:00 40 mL

## 2019-01-14 MED ORDER — VENLAFAXINE HCL ER 75 MG PO CP24
75.0000 mg | ORAL_CAPSULE | Freq: Every day | ORAL | Status: DC
  Administered 2019-01-15: 75 mg via ORAL
  Filled 2019-01-14: qty 1

## 2019-01-14 MED ORDER — FENTANYL CITRATE (PF) 100 MCG/2ML IJ SOLN: 25.0000 ug | INTRAMUSCULAR | Status: DC | PRN

## 2019-01-14 MED ORDER — CHLORHEXIDINE GLUCONATE 4 % EX LIQD: 30.0000 mL | CUTANEOUS | Status: DC

## 2019-01-14 MED ORDER — LEVOTHYROXINE SODIUM 75 MCG PO TABS
75.0000 ug | ORAL_TABLET | Freq: Every day | ORAL | Status: DC
  Administered 2019-01-15: 75 ug via ORAL
  Filled 2019-01-14: qty 1

## 2019-01-14 MED ORDER — ASPIRIN 81 MG PO CHEW
81.0000 mg | CHEWABLE_TABLET | Freq: Every day | ORAL | Status: DC
  Administered 2019-01-15: 81 mg via ORAL
  Filled 2019-01-14: qty 1

## 2019-01-14 MED ORDER — SODIUM CHLORIDE 0.9% FLUSH
3.0000 mL | Freq: Two times a day (BID) | INTRAVENOUS | Status: DC
  Administered 2019-01-14 – 2019-01-15 (×2): 3 mL via INTRAVENOUS

## 2019-01-14 MED ORDER — HEPARIN (PORCINE) IN NACL 1000-0.9 UT/500ML-% IV SOLN
INTRAVENOUS | Status: AC
  Filled 2019-01-14: qty 1500

## 2019-01-14 MED ORDER — ONDANSETRON HCL 4 MG/2ML IJ SOLN: 4.0000 mg | Freq: Four times a day (QID) | INTRAMUSCULAR | Status: DC | PRN

## 2019-01-14 MED ORDER — MUPIROCIN 2 % EX OINT
1.0000 "application " | TOPICAL_OINTMENT | Freq: Once | CUTANEOUS | Status: AC
  Administered 2019-01-14: 11:00:00 1 via TOPICAL

## 2019-01-14 MED ORDER — LIDOCAINE HCL (PF) 1 % IJ SOLN
INTRAMUSCULAR | Status: AC
  Filled 2019-01-14: qty 30

## 2019-01-14 MED ORDER — DEXMEDETOMIDINE HCL 200 MCG/2ML IV SOLN
INTRAVENOUS | Status: DC | PRN
  Administered 2019-01-14: 109.3 ug via INTRAVENOUS

## 2019-01-14 MED ORDER — SODIUM CHLORIDE 0.9 % IV SOLN: 250.0000 mL | INTRAVENOUS | Status: DC | PRN

## 2019-01-14 MED ORDER — SODIUM CHLORIDE 0.9 % IV SOLN
INTRAVENOUS | Status: AC
  Administered 2019-01-14: 19:00:00 via INTRAVENOUS

## 2019-01-14 MED ORDER — PROPOFOL 500 MG/50ML IV EMUL
INTRAVENOUS | Status: DC | PRN
  Administered 2019-01-14: 25 ug/kg/min via INTRAVENOUS

## 2019-01-14 MED ORDER — SODIUM CHLORIDE 0.9% FLUSH: 3.0000 mL | INTRAVENOUS | Status: DC | PRN

## 2019-01-14 MED ORDER — PHENYLEPHRINE HCL-NACL 20-0.9 MG/250ML-% IV SOLN: 0.0000 ug/min | INTRAVENOUS | Status: DC

## 2019-01-14 MED ORDER — VANCOMYCIN HCL IN DEXTROSE 1-5 GM/200ML-% IV SOLN
1000.0000 mg | Freq: Once | INTRAVENOUS | Status: AC
  Administered 2019-01-14: 1000 mg via INTRAVENOUS
  Filled 2019-01-14: qty 200

## 2019-01-14 SURGICAL SUPPLY — 31 items
BAG SNAP BAND KOVER 36X36 (MISCELLANEOUS) ×8 IMPLANT
BLANKET WARM UNDERBOD FULL ACC (MISCELLANEOUS) ×3 IMPLANT
CABLE ADAPT PACING TEMP 12FT (ADAPTER) ×2 IMPLANT
CATH 26 ULTRA DELIVERY (CATHETERS) ×2 IMPLANT
CATH DIAG 6FR PIGTAIL ANGLED (CATHETERS) ×4 IMPLANT
CATH INFINITI 6F AL1 (CATHETERS) ×2 IMPLANT
CATH S G BIP PACING (CATHETERS) ×2 IMPLANT
CLOSURE MYNX CONTROL 6F/7F (Vascular Products) ×2 IMPLANT
CRIMPER (MISCELLANEOUS) ×2 IMPLANT
DEVICE CLOSURE PERCLS PRGLD 6F (VASCULAR PRODUCTS) IMPLANT
DEVICE INFLATION ATRION QL2530 (MISCELLANEOUS) ×2 IMPLANT
GUIDEWIRE SAFE TJ AMPLATZ EXST (WIRE) ×2 IMPLANT
KIT HEART LEFT (KITS) ×3 IMPLANT
KIT MICROPUNCTURE NIT STIFF (SHEATH) ×2 IMPLANT
PACK CARDIAC CATHETERIZATION (CUSTOM PROCEDURE TRAY) ×3 IMPLANT
PERCLOSE PROGLIDE 6F (VASCULAR PRODUCTS) ×6
SHEATH 14X36 EDWARDS (SHEATH) ×2 IMPLANT
SHEATH BRITE TIP 7FR 35CM (SHEATH) ×2 IMPLANT
SHEATH PINNACLE 6F 10CM (SHEATH) ×2 IMPLANT
SHEATH PINNACLE 8F 10CM (SHEATH) ×2 IMPLANT
SHEATH PROBE COVER 6X72 (BAG) ×2 IMPLANT
SLEEVE REPOSITIONING LENGTH 30 (MISCELLANEOUS) ×2 IMPLANT
STOPCOCK MORSE 400PSI 3WAY (MISCELLANEOUS) ×6 IMPLANT
SYR MEDRAD MARK V 150ML (SYRINGE) ×2 IMPLANT
TRANSDUCER W/STOPCOCK (MISCELLANEOUS) ×6 IMPLANT
TUBE CONN 8.8X1320 FR HP M-F (CONNECTOR) ×2 IMPLANT
VALVE 26 ULTRA SAPIEN KIT (Valve) ×2 IMPLANT
WIRE AMPLATZ SS-J .035X180CM (WIRE) ×2 IMPLANT
WIRE EMERALD 3MM-J .035X150CM (WIRE) ×2 IMPLANT
WIRE EMERALD 3MM-J .035X260CM (WIRE) ×2 IMPLANT
WIRE EMERALD ST .035X260CM (WIRE) ×2 IMPLANT

## 2019-01-14 NOTE — Interval H&P Note (Signed)
History and Physical Interval Note:  01/14/2019 11:48 AM  Jessica [name]  has presented today for surgery, with the diagnosis of Severe Aortic Stenosis.  The various methods of treatment have been discussed with the patient and family. After consideration of risks, benefits and other options for treatment, the patient has consented to  Procedure(s): TRANSCATHETER AORTIC VALVE REPLACEMENT, TRANSFEMORAL (N/A) TRANSESOPHAGEAL ECHOCARDIOGRAM (TEE) (N/A) as a surgical intervention.  The patient's history has been reviewed, patient examined, no change in status, stable for surgery.  I have reviewed the patient's chart and labs.  Questions were answered to the patient's satisfaction.     Lauree Chandler

## 2019-01-14 NOTE — Progress Notes (Signed)
  Echocardiogram 2D Echocardiogram limited during TAVR has been performed.  Darlina Sicilian M 01/14/2019, 2:29 PM

## 2019-01-14 NOTE — Anesthesia Preprocedure Evaluation (Signed)
Anesthesia Evaluation  Patient identified by MRN, date of birth, ID band Patient awake    Reviewed: Allergy & Precautions, NPO status , Patient's Chart, lab work & pertinent test results  Airway Mallampati: II  TM Distance: >3 FB     Dental  (+) Edentulous Upper, Edentulous Lower   Pulmonary    breath sounds clear to auscultation       Cardiovascular hypertension,  Rhythm:Regular Rate:Normal + Systolic murmurs    Neuro/Psych    GI/Hepatic   Endo/Other  diabetes  Renal/GU      Musculoskeletal   Abdominal   Peds  Hematology   Anesthesia Other Findings   Reproductive/Obstetrics                             Anesthesia Physical Anesthesia Plan  ASA: III  Anesthesia Plan: MAC   Post-op Pain Management:    Induction: Intravenous  PONV Risk Score and Plan: Ondansetron  Airway Management Planned: Natural Airway and Simple Face Mask  Additional Equipment:   Intra-op Plan:   Post-operative Plan:   Informed Consent: I have reviewed the patients History and Physical, chart, labs and discussed the procedure including the risks, benefits and alternatives for the proposed anesthesia with the patient or authorized representative who has indicated his/her understanding and acceptance.       Plan Discussed with: CRNA and Anesthesiologist  Anesthesia Plan Comments:         Anesthesia Quick Evaluation

## 2019-01-14 NOTE — Progress Notes (Signed)
  [place] [facility] TEAM  Patient doing well s/p TAVR. She is hemodynamically stable but requiring 2 mcg of levophed. Bp is quite labile. Given 500 cc NS. Will give another 500 cc's now. Groin sites stable. ECG with no high grade block (has a PPM). Plan to wean off pressors and send to 4E. If we cannot, we will need to send her to [place]. Plan for early ambulation after bedrest completed and hopeful discharge over the next 24-48 hours.   Angelena Form PA-C  [facility]  Pager [phone]

## 2019-01-14 NOTE — Anesthesia Procedure Notes (Signed)
Procedure Name: MAC Date/Time: 01/14/2019 12:58 PM Performed by: Inda Coke, CRNA Pre-anesthesia Checklist: Patient identified, Emergency Drugs available, Suction available, Timeout performed and Patient being monitored Patient Re-evaluated:Patient Re-evaluated prior to induction Oxygen Delivery Method: Simple face mask Induction Type: IV induction Dental Injury: Teeth and Oropharynx as per pre-operative assessment

## 2019-01-14 NOTE — Discharge Instructions (Signed)
ACTIVITY AND EXERCISE °• Daily activity and exercise are an important part of your recovery. People recover at different rates depending on their general health and type of valve procedure. °• Most people recovering from TAVR feel better relatively quickly  °• No lifting, pushing, pulling more than 10 pounds (examples to avoid: groceries, vacuuming, gardening, golfing): °            - For one week with a procedure through the groin. °            - For six weeks for procedures through the chest wall or neck. °NOTE: You will typically see one of our providers 7-14 days after your procedure to discuss WHEN TO RESUME the above activities.  °  °  °DRIVING °• Do not drive until you are seen for follow up and cleared by a provider. Generally, we ask patient to not drive for 1 week after their procedure. °• If you have been told by your doctor in the past that you may not drive, you must talk with him/her before you begin driving again. °  °DRESSING °• Groin site: you may leave the clear dressing over the site for up to one week or until it falls off. °  °HYGIENE °• If you had a femoral (leg) procedure, you may take a shower when you return home. After the shower, pat the site dry. Do NOT use powder, oils or lotions in your groin area until the site has completely healed. °• If you had a chest procedure, you may shower when you return home unless specifically instructed not to by your discharging practitioner. °            - DO NOT scrub incision; pat dry with a towel. °            - DO NOT apply any lotions, oils, powders to the incision. °            - No tub baths / swimming for at least 2 weeks. °• If you notice any fevers, chills, increased pain, swelling, bleeding or pus, please contact your doctor. °  °ADDITIONAL INFORMATION °• If you are going to have an upcoming dental procedure, please contact our office as you will require antibiotics ahead of time to prevent infection on your heart valve.  ° ° °If you have any  questions or concerns you can call the structural heart phone during normal business hours 8am-4pm. If you have an urgent need after hours or weekends please call [phone] to talk to the on call provider for general cardiology. If you have an emergency that requires immediate attention, please call 911.  ° ° °After TAVR Checklist ° °Check  Test Description  ° Follow up appointment in 1-2 weeks  You will see our structural heart physician assistant, Katie Barabara Motz. Your incision sites will be checked and you will be cleared to drive and resume all normal activities if you are doing well.    ° 1 month echo and follow up  You will have an echo to check on your new heart valve and be seen back in the office by Katie Arabela Basaldua. Many times the echo is not read by your appointment time, but Katie will call you later that day or the following day to report your results.  ° Follow up with your primary cardiologist You will need to be seen by your primary cardiologist in the following 3-6 months after your 1 month appointment in the valve   clinic. Often times your Plavix or Aspirin will be discontinued during this time, but this is decided on a case by case basis.   ° 1 year echo and follow up You will have another echo to check on your heart valve after 1 year and be seen back in the office by Katie Manoj Enriquez. This your last structural heart visit.  ° Bacterial endocarditis prophylaxis  You will have to take antibiotics for the rest of your life before all dental procedures (even teeth cleanings) to protect your heart valve. Antibiotics are also required before some surgeries. Please check with your cardiologist before scheduling any surgeries. Also, please make sure to tell us if you have a penicillin allergy as you will require an alternative antibiotic.   ° ° °

## 2019-01-14 NOTE — Anesthesia Postprocedure Evaluation (Signed)
Anesthesia Post Note  Patient: Jessica [name]  Procedure(s) Performed: TRANSCATHETER AORTIC VALVE REPLACEMENT, TRANSFEMORAL (N/A ) TRANSESOPHAGEAL ECHOCARDIOGRAM (TEE) (N/A )     Patient location during evaluation: Cath Lab Anesthesia Type: MAC Level of consciousness: awake and alert Pain management: pain level controlled Vital Signs Assessment: post-procedure vital signs reviewed and stable Respiratory status: spontaneous breathing, nonlabored ventilation, respiratory function stable and patient connected to nasal cannula oxygen Cardiovascular status: stable and blood pressure returned to baseline Postop Assessment: no apparent nausea or vomiting Anesthetic complications: no    Last Vitals:  Vitals:   01/14/19 1845 01/14/19 1900  BP: 104/63 (!) 100/56  Pulse: 60 60  Resp: 17 12  Temp:    SpO2: 95% 95%    Last Pain:  Vitals:   01/14/19 1512  TempSrc: Temporal  PainSc: 0-No pain                 Alexandar Weisenberger COKER

## 2019-01-14 NOTE — Anesthesia Procedure Notes (Signed)
Arterial Line Insertion Start/End10/13/2020 11:00 AM, 01/14/2019 11:35 AM Performed by: Barrington Ellison, CRNA, CRNA  Patient location: Pre-op. Preanesthetic checklist: patient identified and risks and benefits discussed Lidocaine 1% used for infiltration Left, radial was placed Catheter size: 20 G Allen's test indicative of satisfactory collateral circulation Attempts: 3 Procedure performed without using ultrasound guided technique. Following insertion, dressing applied and Biopatch. Post procedure assessment: normal  Patient tolerated the procedure well with no immediate complications.
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
## 2019-01-14 NOTE — Transfer of Care (Signed)
Immediate Anesthesia Transfer of Care Note  Patient: Jessica [name]  Procedure(s) Performed: TRANSCATHETER AORTIC VALVE REPLACEMENT, TRANSFEMORAL (N/A ) TRANSESOPHAGEAL ECHOCARDIOGRAM (TEE) (N/A )  Patient Location: PACU and Cath Lab  Anesthesia Type:MAC  Level of Consciousness: awake and drowsy  Airway & Oxygen Therapy: Patient Spontanous Breathing and Patient connected to nasal cannula oxygen  Post-op Assessment: Report given to RN and Post -op Vital signs reviewed and stable  Post vital signs: Reviewed and stable  Last Vitals:  Vitals Value Taken Time  BP 83/40 01/14/19 1448  Temp 36.6 C 01/14/19 1448  Pulse 59 01/14/19 1450  Resp 17 01/14/19 1450  SpO2 99 % 01/14/19 1450  Vitals shown include unvalidated device data.  Last Pain:  Vitals:   01/14/19 1448  TempSrc: Temporal  PainSc:          Complications: No apparent anesthesia complications

## 2019-01-15 ENCOUNTER — Other Ambulatory Visit: Payer: Self-pay

## 2019-01-15 ENCOUNTER — Telehealth: Payer: Self-pay | Admitting: *Deleted

## 2019-01-15 ENCOUNTER — Other Ambulatory Visit: Payer: Self-pay | Admitting: Physician Assistant

## 2019-01-15 ENCOUNTER — Inpatient Hospital Stay (HOSPITAL_COMMUNITY): Payer: HMO

## 2019-01-15 ENCOUNTER — Encounter (HOSPITAL_COMMUNITY): Payer: Self-pay | Admitting: Cardiovascular Disease

## 2019-01-15 DIAGNOSIS — Z952 Presence of prosthetic heart valve: Secondary | ICD-10-CM

## 2019-01-15 DIAGNOSIS — I350 Nonrheumatic aortic (valve) stenosis: Principal | ICD-10-CM

## 2019-01-15 LAB — GLUCOSE, CAPILLARY
Glucose-Capillary: 140 mg/dL — ABNORMAL HIGH (ref 70–99)
Glucose-Capillary: 301 mg/dL — ABNORMAL HIGH (ref 70–99)
Glucose-Capillary: 196 mg/dL — ABNORMAL HIGH (ref 70–99)

## 2019-01-15 LAB — BASIC METABOLIC PANEL
Anion gap: 10 (ref 5–15)
BUN: 15 mg/dL (ref 8–23)
CO2: 20 mmol/L — ABNORMAL LOW (ref 22–32)
Calcium: 8.5 mg/dL — ABNORMAL LOW (ref 8.9–10.3)
Chloride: 106 mmol/L (ref 98–111)
Creatinine, Ser: 0.7 mg/dL (ref 0.44–1.00)
GFR calc Af Amer: >60 mL/min (ref >60)
GFR calc non Af Amer: >60 mL/min (ref >60)
Glucose, Bld: 142 mg/dL — ABNORMAL HIGH (ref 70–99)
Potassium: 4.2 mmol/L (ref 3.5–5.1)
Sodium: 136 mmol/L (ref 135–145)

## 2019-01-15 LAB — CBC
HCT: 40.3 % (ref 36.0–46.0)
Hemoglobin: 13.4 g/dL (ref 12.0–15.0)
MCH: 31.8 pg (ref 26.0–34.0)
MCHC: 33.3 g/dL (ref 30.0–36.0)
MCV: 95.5 fL (ref 80.0–100.0)
Platelets: 214 10*3/uL (ref 150–400)
RBC: 4.22 MIL/uL (ref 3.87–5.11)
RDW: 13.3 % (ref 11.5–15.5)
WBC: 6.7 10*3/uL (ref 4.0–10.5)
nRBC: 0 % (ref 0.0–0.2)

## 2019-01-15 LAB — MAGNESIUM: Magnesium: 2.1 mg/dL (ref 1.7–2.4)

## 2019-01-15 MED ORDER — ASPIRIN 81 MG PO CHEW: 81.0000 mg | CHEWABLE_TABLET | Freq: Every day | ORAL | Status: DC

## 2019-01-15 MED ORDER — FUROSEMIDE 80 MG PO TABS
80.0000 mg | ORAL_TABLET | Freq: Every day | ORAL | Status: DC
  Administered 2019-01-15: 80 mg via ORAL
  Filled 2019-01-15: qty 1

## 2019-01-15 MED ORDER — POTASSIUM CHLORIDE CRYS ER 20 MEQ PO TBCR
20.0000 meq | EXTENDED_RELEASE_TABLET | Freq: Every day | ORAL | Status: DC
  Administered 2019-01-15: 20 meq via ORAL
  Filled 2019-01-15: qty 1

## 2019-01-15 NOTE — Patient Outreach (Signed)
  [facility]) Care Management Chronic Special Needs Program    01/15/2019  Name: Jessica [name], DOB: Jun 01, 1933  MRN: [id number]   Ms. Jessica [name] is enrolled in a chronic special needs plan. Client admitted on 01/14/2019 - Transcatheter aortic valve replacement, transfemoral. Per policy procedure individualized care plan sent to utilization management.  Plan: RNCM will continue to follow as client's C-SNP Chronic care management coordinator.  Thea Silversmith, RN, MSN, [place] [phone]

## 2019-01-15 NOTE — Discharge Summary (Addendum)
[facility] VALVE TEAM  Discharge Summary    Patient ID: Jessica [name] MRN: [id number]; DOB: 26-May-1933  Admit date: 01/14/2019 Discharge date: 01/15/2019  Primary Care Provider: [name], MD  Primary Cardiologist: Skeet Latch, MD / Dr. Angelena Form & Dr. Roxy Manns (TAVR)  Discharge Diagnoses    Principal Problem:   S/P TAVR (transcatheter aortic valve replacement) Active Problems:   Hypothyroidism   DM type 2, controlled, with complication ([place])   HLD (hyperlipidemia)   OBESITY, MORBID   Anxiety disorder   Essential hypertension   GERD (gastroesophageal reflux disease)   Chronic venous insufficiency   Acute on chronic diastolic heart failure (HCC)   Atrial fibrillation and flutter (HCC)   Severe aortic stenosis   History of pulmonary embolism   Presence of permanent cardiac pacemaker   Pulmonary nodule   Allergies Allergies  Allergen Reactions   Coreg [Carvedilol] Other (See Comments)    Weak legs   Relafen [Nabumetone] Other (See Comments)    Upset stomach   Amiodarone Other (See Comments)    Patient reported intolerance to Amiodarone with worsened tremor and stopped pta. (hand tremors)   Atorvastatin     Myalgias   Calcium Other (See Comments)    unknown   Codeine Other (See Comments)    unknown   Rofecoxib Other (See Comments)    Unknown (vioxx)   Simvastatin     Myalgias   Enalapril Maleate Other (See Comments)    REACTION: cough    Diagnostic Studies/Procedures     TAVR OPERATIVE NOTE   Date of Procedure:                01/14/2019  Preoperative Diagnosis:      Severe Aortic Stenosis   Postoperative Diagnosis:    Same   Procedure:        Transcatheter Aortic Valve Replacement - Percutaneous Left Transfemoral Approach             Edwards Sapien 3 Ultra THV (size 26 mm, model # 9750TFX, serial # [id number])              Co-Surgeons:                        Lauree Chandler, MD and Valentina Gu. Roxy Manns, MD   Anesthesiologist:                  Roberts Gaudy, MD  Echocardiographer:              Ena Dawley, MD  Pre-operative Echo Findings: ? Severe aortic stenosis ? Normal left ventricular systolic function  Post-operative Echo Findings: ? No paravalvular leak ? Normal left ventricular systolic function _____________   Echo 01/15/19: completed but pending formal read at the time of discharge    History of Present Illness     Jessica [name] is a 85 y.o. female with a history of paroxysmal atrial flutter/fibrillation on Eliquis, chronic combined S/D CHF, high grade AV block & AVN ablation s/p PPM, GERD, depression, HTN, HLD, hypothyroidism, DVT with bilateral pulmonary embolism, diabetes mellitus and severe AS who presented to [facility] on 01/14/19 for planned TAVR.  Patient's cardiac history dates back approximately 6 or 7 years ago when she presented with symptomatic bradycardia related to second-degree AV block.  She underwent permanent pacemaker implantation.  Echocardiogram at that time revealed normal left ventricular systolic function with mild to moderate aortic stenosis.  Interrogation of her pacemaker since that time has documented the presence of recurrent atrial fibrillation and atrial flutter.  She is now chronically anticoagulated using Eliquis.  She has been followed for the last several years by Dr. Oval Linsey and treated with diuretic therapy for chronic systolic and diastolic CHF. EF down to 30% at one point (on echo 06/2017). She was evaluated by Dr. Rayann Heman for possible CRT upgrade but this was deferred. She has developed gradual progression of symptoms of exertional shortness of breath and fatigue.  Recent follow-up echocardiogram revealed significant progression and severity of aortic stenosis with improvement in her left ventricular systolic function to normal (EF 55-60%).  She was referred to the multidisciplinary heart valve clinic and  has been evaluated previously by Dr. Angelena Form. Diagnostic cardiac catheterization confirmed the presence of severe aortic stenosis and notable for the absence of significant coronary artery disease.   The patient has been evaluated by the multidisciplinary valve team and felt to have severe, symptomatic aortic stenosis and to be a suitable candidate for TAVR, which was set up for 01/14/19.    Hospital Course     Consultants: none  Severe AS: s/p successful TAVR with a 33m mm Edwards Sapien 3 Ultra THV via the TF approach on 01/14/19. Post operative echo competed but pending formal read. Groin sites are stable. ECG with V paced rhythm. Resume home Eliquis and add baby aspirin 87mdaily after TAVR.  Acute on chronic diastolic CHF: as evidenced by an elevated BNP on preadmission labs and cardiomegaly on CXR. This has been treated with TAVR. Resume home lasix 8039maily. She has a history of reduced EF. Continue Entresto 97-103m13m HTN: BP was labile after surgery and it was difficult to wean her off pressors immediately after surgery. Her BP is now well controlled. Plan to resume home medications.   DMT2: resume home medications including Jardiance and Metformin. Metformin will be held x 48 hours after contrast dye exposure. Okay to resume on 10/15 PM.  Hx of DVT/PE: resume home Eliquis tonight.   Hx of AV node ablation and PPM: followed by Dr. AllrRayann HemanF: resume Eliquis tonight.   Pulmonary nodule: nodule in the inferior segment of the lingula with a mean diameter of 8 mm. Non-contrast chest CT at 6-12 months is recommended. If the nodule is stable at time of repeat CT, then future CT at 18-24 months (from today's scan) is considered optional for low-risk patients, but is recommended for high-risk patients. This will be discussed in the outpatient setting and I will have CT set up.  _____________  Discharge Vitals Blood pressure (!) 130/51, pulse 60, temperature (!) 97.2 F (36.2 C),  temperature source Oral, resp. rate (!) 24, height 5' 2" (1.575 m), weight 109.3 kg, SpO2 95 %.  Filed Weights   01/14/19 1004  Weight: 109.3 kg    PHYSICAL EXAM:    GEN: Well nourished, well developed, in no acute distress, morbidly obese.  HEENT: normal Neck: no JVD or masses Cardiac: RRR; soft flow murmur. No rubs, or gallops,no edema  Respiratory:  clear to auscultation bilaterally, normal work of breathing GI: soft, nontender, nondistended, + BS MS: no deformity or atrophy Skin: warm and dry, no rash.  Groin sites clear without hematoma or ecchymosis  Neuro:  Alert and Oriented x 3, Strength and sensation are intact Psych: euthymic mood, full affect   Labs & Radiologic Studies    CBC Recent Labs    01/14/19 1449 01/15/19 0452  WBC  --  6.7  HGB 13.6 13.4  HCT 40.0 40.3  MCV  --  95.5  PLT  --  563   Basic Metabolic Panel Recent Labs    01/14/19 1449 01/15/19 0452  NA 141 136  K 3.8 4.2  CL 107 106  CO2  --  20*  GLUCOSE 174* 142*  BUN 23 15  CREATININE 0.80 0.70  CALCIUM  --  8.5*  MG  --  2.1   Liver Function Tests No results for input(s): AST, ALT, ALKPHOS, BILITOT, PROT, ALBUMIN in the last 72 hours. No results for input(s): LIPASE, AMYLASE in the last 72 hours. Cardiac Enzymes No results for input(s): CKTOTAL, CKMB, CKMBINDEX, TROPONINI in the last 72 hours. BNP Invalid input(s): POCBNP D-Dimer No results for input(s): DDIMER in the last 72 hours. Hemoglobin A1C No results for input(s): HGBA1C in the last 72 hours. Fasting Lipid Panel No results for input(s): CHOL, HDL, LDLCALC, TRIG, CHOLHDL, LDLDIRECT in the last 72 hours. Thyroid Function Tests No results for input(s): TSH, T4TOTAL, T3FREE, THYROIDAB in the last 72 hours.  Invalid input(s): FREET3 _____________  Dg Chest 2 View  Result Date: 01/11/2019 CLINICAL DATA:  Preop for TAVR EXAM: CHEST - 2 VIEW COMPARISON:  Radiograph 06/22/2017, CT 10/07/2008 FINDINGS: Pacer pack overlies the  left chest wall. Leads in stable position at the right atrium and cardiac apex. Mild cardiomegaly is similar to prior. Coarsened interstitial changes in the lung bases are similar to prior. No consolidation, features of edema, pneumothorax, or effusion. No acute osseous or soft tissue abnormality. IMPRESSION: 1. Chronic coarse interstitial changes. No acute cardiopulmonary disease. 2. Stable mild cardiomegaly Electronically Signed   By: Lovena Le M.D.   On: 01/11/2019 03:01   Ct Coronary Morph W/cta Cor W/score W/ca W/cm &/or Wo/cm  Addendum Date: 01/07/2019   ADDENDUM REPORT: 01/07/2019 22:52 CLINICAL DATA:  79 -year-old female with severe aortic stenosis being evaluated for a TAVR procedure. EXAM: Cardiac TAVR CT TECHNIQUE: The patient was scanned on a [employer]. A 120 kV retrospective scan was triggered in the descending thoracic aorta at 111 HU's. Gantry rotation speed was 250 msecs and collimation was .6 mm. No beta blockade or nitro were given. The 3D data set was reconstructed in 5% intervals of the R-R cycle. Systolic and diastolic phases were analyzed on a dedicated work station using MPR, MIP and VRT modes. The patient received 80 cc of contrast. FINDINGS: Aortic Root: Aortic valve: Trileaflet Aortic valve calcium score: 2250 Aortic annulus: Diameter: 60m x 238mPerimeter: 8145mrea: 496 mm^2 Calcifications: No calcifications Coronary height: Min Left - 75m7max Left - 21mm53mn Right - 13mm 62mtubular height: Left cusp - 23mm; 39mt cusp - 21mm; N23mronary cusp - 22mm LVO33ms measured 3 mm below the annulus): Diameter: 30mm x 2381mrea:37m mm^2 Calcifications: No calcifications Aortic sinus width: Left cusp - 34mm; Right73mp - 35mm; Noncor58my cusp - 35mm Sinotubu13mjunction width: 31mm x 29mm Op76mm Fl75mscopic Angle for Delivery (centered on right coronary cusp): LAO 1 CRA 3 Cardiac: Right atrium: Normal size Right ventricle: Normal size Pulmonary arteries: Normal size  Pulmonary veins: Normal configuration Left atrium: Moderate enlargement Left ventricle: Normal size Pericardium: Normal thickness Coronary arteries: Moderate calcifications IMPRESSION: 1.  Severely calcified trileaflet aortic valve (calcium score 2250) 2. Aortic annulus measures 29mm x 24mm with60mimet10m1mm and area 496 20m. No annular or LVOT calcifications 3. Sufficient coronary to annulus distance: 75mm to left main,54m  64m to right coronary artery 4. Optimum Fluoroscopic Angle for Delivery (centered on right coronary cusp): LAO 1 CRA 3 Electronically Signed   By: COswaldo MilianMD   On: 01/07/2019 22:52   Result Date: 01/07/2019 EXAM: OVER-READ INTERPRETATION  CT CHEST The following report is an over-read performed by radiologist Dr. DVinnie Langtonof [facility] 01/07/2019. This over-read does not include interpretation of cardiac or coronary anatomy or pathology. The coronary calcium score/coronary CTA interpretation by the cardiologist is attached. COMPARISON:  Chest CTA 10/07/2008. FINDINGS: Extracardiac findings were described separately under dictation for contemporaneously obtained CTA chest, abdomen and pelvis. IMPRESSION: Please see separate dictation for contemporaneously obtained CTA chest, abdomen and pelvis dated 01/07/2019 for full description of relevant extracardiac findings. Electronically Signed: By: DVinnie LangtonM.D. On: 01/07/2019 15:05   Dg Chest Port 1 View  Result Date: 01/14/2019 CLINICAL DATA:  Status post TAVR EXAM: PORTABLE CHEST 1 VIEW COMPARISON:  10/07/2008 CT, radiograph 01/10/2019 FINDINGS: Left-sided pacing device with similar lead orientation. Interval aortic valve placement. No acute consolidation or effusion. Stable cardiomediastinal silhouette with mild cardiomegaly. Moderate hiatal hernia. Mild coarse chronic interstitial changes at the bases. IMPRESSION: No active disease. Stable mild cardiomegaly and hiatal hernia. Interval TAVR.  Electronically Signed   By: KDonavan FoilM.D.   On: 01/14/2019 19:35   Ct Angio Chest Aorta W &/or Wo Contrast  Result Date: 01/07/2019 CLINICAL DATA:  85year old female with history of severe aortic stenosis. Preprocedural study prior to potential transcatheter aortic valve replacement (TAVR) procedure. EXAM: CT ANGIOGRAPHY CHEST, ABDOMEN AND PELVIS TECHNIQUE: Multidetector CT imaging through the chest, abdomen and pelvis was performed using the standard protocol during bolus administration of intravenous contrast. Multiplanar reconstructed images and MIPs were obtained and reviewed to evaluate the vascular anatomy. CONTRAST:  1060mOMNIPAQUE IOHEXOL 350 MG/ML SOLN COMPARISON:  CT the abdomen and pelvis 08/03/2005. FINDINGS: CTA CHEST FINDINGS Cardiovascular: Heart size is mildly enlarged. There is no significant pericardial fluid, thickening or pericardial calcification. There is aortic atherosclerosis, as well as atherosclerosis of the great vessels of the mediastinum and the coronary arteries, including calcified atherosclerotic plaque in the left main, left anterior descending and right coronary arteries. Severe thickening calcification of the aortic valve and mitral annulus. Mediastinum/Lymph Nodes: No pathologically enlarged mediastinal or hilar lymph nodes. Please note that accurate exclusion of hilar adenopathy is limited on noncontrast CT scans. Large hiatal hernia. No axillary lymphadenopathy. Lungs/Pleura: No acute consolidative airspace disease. No pleural effusions. In the inferior segment of the lingula there is a 9 x 7 mm (mean diameter of 8 mm) pulmonary nodule (axial image 63 of series 17). Musculoskeletal/Soft Tissues: There are no aggressive appearing lytic or blastic lesions noted in the visualized portions of the skeleton. CTA ABDOMEN AND PELVIS FINDINGS Hepatobiliary: No suspicious cystic or solid hepatic lesions. No intrahepatic biliary ductal dilatation. Gallbladder is not visualized,  presumably surgically absent. No surgical clips are noted in the gallbladder fossa. Common bile duct is dilated up to 11 mm in the porta hepatis, likely reflective of benign post cholecystectomy physiology. Pancreas: No pancreatic mass. No pancreatic ductal dilatation. No pancreatic or peripancreatic fluid collections or inflammatory changes. Spleen: Unremarkable. Adrenals/Urinary Tract: Multiple subcentimeter low-attenuation lesions in the left kidney, too small to characterize, but statistically likely to represent tiny cysts. Right kidney and bilateral adrenal glands are normal in appearance. No hydroureteronephrosis. Urinary bladder is normal in appearance. Stomach/Bowel: Intra-abdominal portion of the stomach is normal. No pathologic dilatation of small bowel  or colon. Numerous colonic diverticulae are noted, without surrounding inflammatory changes to suggest an acute diverticulitis at this time. The appendix is not confidently identified and may be surgically absent. Regardless, there are no inflammatory changes noted adjacent to the cecum to suggest the presence of an acute appendicitis at this time. Vascular/Lymphatic: Aortic atherosclerosis, without evidence of aneurysm or dissection in the abdominal or pelvic vasculature. No lymphadenopathy noted in the abdomen or pelvis. Reproductive: 1.9 x 1.2 cm peripherally calcified lesion in the right-side of the uterine body, likely a calcified fibroid. Ovaries are unremarkable in appearance. Other: No significant volume of ascites.  No pneumoperitoneum. Musculoskeletal: There are no aggressive appearing lytic or blastic lesions noted in the visualized portions of the skeleton. VASCULAR MEASUREMENTS PERTINENT TO TAVR: AORTA: Minimal Aortic Diameter-11 x 10 mm Severity of Aortic Calcification-severe RIGHT PELVIS: Right Common Iliac Artery - Minimal Diameter-2.3 x 8.7 mm Tortuosity-mild Calcification-moderate Right External Iliac Artery - Minimal Diameter-7.1 x 5.9  mm Tortuosity - mild Calcification-mild Right Common Femoral Artery - Minimal Diameter-6.2 x 6.1 mm Tortuosity - mild Calcification-mild LEFT PELVIS: Left Common Iliac Artery - Minimal Diameter-6.1 x 7.7 mm Tortuosity - mild Calcification-moderate Left External Iliac Artery - Minimal Diameter-6.8 x 7.2 mm Tortuosity - mild Calcification-none Left Common Femoral Artery - Minimal Diameter-6.4 x 6.3 mm Tortuosity - mild Calcification-none Review of the MIP images confirms the above findings. IMPRESSION: 1. Vascular findings and measurements pertinent to potential TAVR procedure, as detailed above. 2. Severe thickening calcification of the aortic valve, compatible with the reported clinical history of severe aortic stenosis. 3. Aortic atherosclerosis, in addition to left main and 2 vessel coronary artery disease. 4. Nodule in the inferior segment of the lingula with a mean diameter of 8 mm. Non-contrast chest CT at 6-12 months is recommended. If the nodule is stable at time of repeat CT, then future CT at 18-24 months (from today's scan) is considered optional for low-risk patients, but is recommended for high-risk patients. This recommendation follows the consensus statement: Guidelines for Management of Incidental Pulmonary Nodules Detected on CT Images: From the Fleischner Society 2017; Radiology 2017; 284:228-243. 5. Severe colonic diverticulosis without evidence of acute diverticulitis at this time. 6. Additional incidental findings, as above. Electronically Signed   By: Vinnie Langton M.D.   On: 01/07/2019 14:37   Vas US Carotid  Result Date: 12/28/2018 Carotid Arterial Duplex Study Indications:       Pre tavr. Risk Factors:      Hypertension, hyperlipidemia, Diabetes. Limitations        Today's exam was limited due to the body habitus of the                    patient and patient heavy breathing interference. Comparison Study:  no prior Performing Technologist: Abram Sander RVS  Examination Guidelines: A  complete evaluation includes B-mode imaging, spectral Doppler, color Doppler, and power Doppler as needed of all accessible portions of each vessel. Bilateral testing is considered an integral part of a complete examination. Limited examinations for reoccurring indications may be performed as noted.  Right Carotid Findings: +----------+--------+--------+--------+------------------+--------------+             PSV cm/s EDV cm/s Stenosis Plaque Description Comments        +----------+--------+--------+--------+------------------+--------------+  CCA Prox   61       7                 heterogenous                       +----------+--------+--------+--------+------------------+--------------+  CCA Distal 34       7                 heterogenous                       +----------+--------+--------+--------+------------------+--------------+  ICA Prox   40       10       1-39%    heterogenous                       +----------+--------+--------+--------+------------------+--------------+  ICA Distal                                               Not visualized  +----------+--------+--------+--------+------------------+--------------+  ECA        88       7                                                    +----------+--------+--------+--------+------------------+--------------+ +----------+--------+-------+--------+-------------------+             PSV cm/s EDV cms Describe Arm Pressure (mmHG)  +----------+--------+-------+--------+-------------------+  Subclavian 94                                             +----------+--------+-------+--------+-------------------+ +---------+--------+--------+--------------+  Vertebral PSV cm/s EDV cm/s Not identified  +---------+--------+--------+--------------+  Left Carotid Findings: +----------+--------+--------+--------+------------------+--------+             PSV cm/s EDV cm/s Stenosis Plaque Description Comments  +----------+--------+--------+--------+------------------+--------+   CCA Prox   74       13                heterogenous                 +----------+--------+--------+--------+------------------+--------+  CCA Distal 45       10                heterogenous                 +----------+--------+--------+--------+------------------+--------+  ICA Prox   55       15       1-39%    heterogenous                 +----------+--------+--------+--------+------------------+--------+  ICA Distal 45       12                                             +----------+--------+--------+--------+------------------+--------+  ECA        56       7                                              +----------+--------+--------+--------+------------------+--------+ +----------+--------+--------+--------+-------------------+             PSV cm/s EDV cm/s Describe Arm Pressure (mmHG)  +----------+--------+--------+--------+-------------------+  Subclavian 94                                              +----------+--------+--------+--------+-------------------+ +---------+--------+--+--------+-+---------+  Vertebral PSV cm/s 34 EDV cm/s 7 Antegrade  +---------+--------+--+--------+-+---------+  Summary: Right Carotid: Velocities in the right ICA are consistent with a 1-39% stenosis. Left Carotid: Velocities in the left ICA are consistent with a 1-39% stenosis. Vertebrals: Left vertebral artery demonstrates antegrade flow. Right vertebral             artery was not visualized. *See table(s) above for measurements and observations.  Electronically signed by Curt Jews MD on 12/28/2018 at 4:03:54 AM.    Final    Ct Angio Abdomen Pelvis  W &/or Wo Contrast  Result Date: 01/07/2019 CLINICAL DATA:  85 year old female with history of severe aortic stenosis. Preprocedural study prior to potential transcatheter aortic valve replacement (TAVR) procedure. EXAM: CT ANGIOGRAPHY CHEST, ABDOMEN AND PELVIS TECHNIQUE: Multidetector CT imaging through the chest, abdomen and pelvis was performed using the standard protocol  during bolus administration of intravenous contrast. Multiplanar reconstructed images and MIPs were obtained and reviewed to evaluate the vascular anatomy. CONTRAST:  141m OMNIPAQUE IOHEXOL 350 MG/ML SOLN COMPARISON:  CT the abdomen and pelvis 08/03/2005. FINDINGS: CTA CHEST FINDINGS Cardiovascular: Heart size is mildly enlarged. There is no significant pericardial fluid, thickening or pericardial calcification. There is aortic atherosclerosis, as well as atherosclerosis of the great vessels of the mediastinum and the coronary arteries, including calcified atherosclerotic plaque in the left main, left anterior descending and right coronary arteries. Severe thickening calcification of the aortic valve and mitral annulus. Mediastinum/Lymph Nodes: No pathologically enlarged mediastinal or hilar lymph nodes. Please note that accurate exclusion of hilar adenopathy is limited on noncontrast CT scans. Large hiatal hernia. No axillary lymphadenopathy. Lungs/Pleura: No acute consolidative airspace disease. No pleural effusions. In the inferior segment of the lingula there is a 9 x 7 mm (mean diameter of 8 mm) pulmonary nodule (axial image 63 of series 17). Musculoskeletal/Soft Tissues: There are no aggressive appearing lytic or blastic lesions noted in the visualized portions of the skeleton. CTA ABDOMEN AND PELVIS FINDINGS Hepatobiliary: No suspicious cystic or solid hepatic lesions. No intrahepatic biliary ductal dilatation. Gallbladder is not visualized, presumably surgically absent. No surgical clips are noted in the gallbladder fossa. Common bile duct is dilated up to 11 mm in the porta hepatis, likely reflective of benign post cholecystectomy physiology. Pancreas: No pancreatic mass. No pancreatic ductal dilatation. No pancreatic or peripancreatic fluid collections or inflammatory changes. Spleen: Unremarkable. Adrenals/Urinary Tract: Multiple subcentimeter low-attenuation lesions in the left kidney, too small to  characterize, but statistically likely to represent tiny cysts. Right kidney and bilateral adrenal glands are normal in appearance. No hydroureteronephrosis. Urinary bladder is normal in appearance. Stomach/Bowel: Intra-abdominal portion of the stomach is normal. No pathologic dilatation of small bowel or colon. Numerous colonic diverticulae are noted, without surrounding inflammatory changes to suggest an acute diverticulitis at this time. The appendix is not confidently identified and may be surgically absent. Regardless, there are no inflammatory changes noted adjacent to the cecum to suggest the presence of an acute appendicitis at this time. Vascular/Lymphatic: Aortic atherosclerosis, without evidence of aneurysm or dissection in the abdominal or pelvic vasculature. No lymphadenopathy noted in the abdomen or pelvis. Reproductive: 1.9 x 1.2 cm peripherally calcified lesion in the right-side of the uterine body,  likely a calcified fibroid. Ovaries are unremarkable in appearance. Other: No significant volume of ascites.  No pneumoperitoneum. Musculoskeletal: There are no aggressive appearing lytic or blastic lesions noted in the visualized portions of the skeleton. VASCULAR MEASUREMENTS PERTINENT TO TAVR: AORTA: Minimal Aortic Diameter-11 x 10 mm Severity of Aortic Calcification-severe RIGHT PELVIS: Right Common Iliac Artery - Minimal Diameter-2.3 x 8.7 mm Tortuosity-mild Calcification-moderate Right External Iliac Artery - Minimal Diameter-7.1 x 5.9 mm Tortuosity - mild Calcification-mild Right Common Femoral Artery - Minimal Diameter-6.2 x 6.1 mm Tortuosity - mild Calcification-mild LEFT PELVIS: Left Common Iliac Artery - Minimal Diameter-6.1 x 7.7 mm Tortuosity - mild Calcification-moderate Left External Iliac Artery - Minimal Diameter-6.8 x 7.2 mm Tortuosity - mild Calcification-none Left Common Femoral Artery - Minimal Diameter-6.4 x 6.3 mm Tortuosity - mild Calcification-none Review of the MIP images  confirms the above findings. IMPRESSION: 1. Vascular findings and measurements pertinent to potential TAVR procedure, as detailed above. 2. Severe thickening calcification of the aortic valve, compatible with the reported clinical history of severe aortic stenosis. 3. Aortic atherosclerosis, in addition to left main and 2 vessel coronary artery disease. 4. Nodule in the inferior segment of the lingula with a mean diameter of 8 mm. Non-contrast chest CT at 6-12 months is recommended. If the nodule is stable at time of repeat CT, then future CT at 18-24 months (from today's scan) is considered optional for low-risk patients, but is recommended for high-risk patients. This recommendation follows the consensus statement: Guidelines for Management of Incidental Pulmonary Nodules Detected on CT Images: From the Fleischner Society 2017; Radiology 2017; 284:228-243. 5. Severe colonic diverticulosis without evidence of acute diverticulitis at this time. 6. Additional incidental findings, as above. Electronically Signed   By: Vinnie Langton M.D.   On: 01/07/2019 14:37   Disposition   Pt is being discharged home today in good condition.  Follow-up Plans & Appointments    Follow-up Information    Eileen Stanford, PA-C. Go on 01/22/2019.   Specialties: Cardiology, Radiology Why: @ 3:30pm, please arrive at least 10 minutes early.  Contact information: [address] [place] [address] [phone]            Discharge Medications   Allergies as of 01/15/2019      Reactions   Coreg [carvedilol] Other (See Comments)   Weak legs   Relafen [nabumetone] Other (See Comments)   Upset stomach   Amiodarone Other (See Comments)   Patient reported intolerance to Amiodarone with worsened tremor and stopped pta. (hand tremors)   Atorvastatin    Myalgias   Calcium Other (See Comments)   unknown   Codeine Other (See Comments)   unknown   Rofecoxib Other (See Comments)   Unknown  (vioxx)   Simvastatin    Myalgias   Enalapril Maleate Other (See Comments)   REACTION: cough      Medication List    STOP taking these medications   losartan 100 MG tablet Commonly known as: COZAAR     TAKE these medications   acetaminophen 325 MG tablet Commonly known as: TYLENOL Take 2 tablets (650 mg total) by mouth every 6 (six) hours as needed for mild pain (or Fever >/= 101).   allopurinol 100 MG tablet Commonly known as: ZYLOPRIM Take 1 tablet (100 mg total) by mouth daily.   apixaban 5 MG Tabs tablet Commonly known as: Eliquis Take 1 tablet (5 mg total) by mouth 2 (two) times daily.   aspirin 81 MG chewable  tablet Chew 1 tablet (81 mg total) by mouth daily.   b complex vitamins tablet Take 1 tablet by mouth daily.   Cholecalciferol 25 MCG (1000 UT) tablet Take 1,000 Units by mouth daily.   Entresto 97-103 MG Generic drug: sacubitril-valsartan Take 1 tablet by mouth 2 (two) times daily. What changed: Another medication with the same name was removed. Continue taking this medication, and follow the directions you see here.   furosemide 40 MG tablet Commonly known as: LASIX Take 1 tablet (40 mg total) by mouth 2 (two) times daily. What changed:   how much to take  when to take this   Jardiance 10 MG Tabs tablet Generic drug: empagliflozin Take 10 mg by mouth daily before breakfast.   Klor-Con M20 20 MEQ tablet Generic drug: potassium chloride SA TAKE 1 TABLET BY MOUTH EVERY DAY What changed: how much to take   levothyroxine 75 MCG tablet Commonly known as: SYNTHROID Take 1 tablet (75 mcg total) by mouth daily.   LORazepam 0.5 MG tablet Commonly known as: ATIVAN Take 0.5 mg by mouth 2 (two) times daily. Take 1 tablet (0.5 mg) by mouth scheduled at night, may take an additional dose during the day if needed for anxiety   Lubricant Eye Drops 0.4-0.3 % Soln Generic drug: Polyethyl Glycol-Propyl Glycol Place 1 drop into both eyes 4 (four) times  daily as needed (dry/irritated eyes.).   metFORMIN 500 MG tablet Commonly known as: GLUCOPHAGE Take 1 tablet (500 mg total) by mouth 2 (two) times daily with a meal.   metoprolol succinate 50 MG 24 hr tablet Commonly known as: TOPROL-XL Take 50 mg by mouth daily with breakfast. Take with or immediately following a meal. Notes to patient: Hold Meformin x 48 hours after TAVR. Okay to resume on 10/15 PM.   multivitamin with minerals Tabs tablet Take 1 tablet by mouth daily. Women's Centrum   PRESERVISION AREDS 2 PO Take 1 tablet by mouth 2 (two) times daily.   temazepam 15 MG capsule Commonly known as: RESTORIL Take 1 capsule (15 mg total) by mouth at bedtime as needed for sleep. What changed: when to take this   True Metrix Air Glucose Meter w/Device Kit CHECK BLOOD SUGAR TWICE DAILY   True Metrix Blood Glucose Test test strip Generic drug: glucose blood CHECK BLOOD SUGAR TWICE DAILY   TRUEplus Lancets 33G Misc CHECK BLOOD SUGARS TWICE A DAY   venlafaxine XR 75 MG 24 hr capsule Commonly known as: EFFEXOR-XR TAKE 1 CAPSULE EVERY DAY WITH BREAKFAST What changed:   how much to take  how to take this  when to take this         Outstanding Labs/Studies   none  Duration of Discharge Encounter   Greater than 30 minutes including physician time.  Signed, Angelena Form, PA-C 01/15/2019, 9:56 AM [phone]  I have personally seen and examined this patient with Nell Range, PA-C. I agree with the assessment and plan as outlined above. She is doing well day one post TAVR with placemetn of an [place] 3 THV from the left transfemoral approach. Groin stable bilaterally. BP stable. Echo with no PVL across the valve. Paced this am with permanent pacer. Discharge home today. Resume anti-ocagulation with Eliquis. Start ASA. _0  Lauree Chandler 01/15/2019 10:05 AM

## 2019-01-15 NOTE — Plan of Care (Signed)
  Problem: Elimination: Goal: Will not experience complications related to bowel motility Outcome: Completed/Met   Problem: Activity: Goal: Risk for activity intolerance will decrease 01/15/2019 1053 by Fuller Canada T, RN Outcome: Completed/Met 01/15/2019 1052 by Barbara Cower, RN Outcome: Adequate for Discharge   Problem: Clinical Measurements: Goal: Cardiovascular complication will be avoided 01/15/2019 1053 by Fuller Canada T, RN Outcome: Completed/Met 01/15/2019 1052 by Barbara Cower, RN Outcome: Adequate for Discharge   Problem: Clinical Measurements: Goal: Will remain free from infection 01/15/2019 1053 by Fuller Canada T, RN Outcome: Completed/Met 01/15/2019 1052 by Barbara Cower, RN Outcome: Adequate for Discharge   Problem: Health Behavior/Discharge Planning: Goal: Ability to manage health-related needs will improve 01/15/2019 1053 by Fuller Canada T, RN Outcome: Completed/Met 01/15/2019 1052 by Barbara Cower, RN Outcome: Adequate for Discharge   Problem: Education: Goal: Knowledge of General Education information will improve Description: Including pain rating scale, medication(s)/side effects and non-pharmacologic comfort measures 01/15/2019 1053 by Fuller Canada T, RN Outcome: Completed/Met 01/15/2019 1052 by Barbara Cower, RN Outcome: Adequate for Discharge

## 2019-01-15 NOTE — Telephone Encounter (Signed)
Pt was on TCM report admitted for TAVR (transcatheter aortic valve replacement) which was successful.  TAVR with a 78mm mm Edwards Sapien 3 Ultra THV via the TF.Post operative echo competed but pending formal read. Groin sites are stable. ECG with V paced rhythm. Pt D/C 01/15/19, and will follow-up w/ cardiology Woodfin Ganja, PA-C (Cardiology) on 01/22/2019; @ 3:30pm../lmb

## 2019-01-15 NOTE — Progress Notes (Signed)
CARDIAC REHAB PHASE I   PRE:  Rate/Rhythm: 60 paced  BP:  Sitting: 121/67      SaO2: 94 RA  MODE:  Ambulation: 220 ft   POST:  Rate/Rhythm: 60 paced  BP:  Sitting: 145/88    SaO2: 93 RA  Pt ambulated 215ft in hallway assist of 2 with gait belt and front wheel walker. Pt states he breathing feels better, was SOB upon return to recliner. Reviewed restrictions and site care. Encouraged ambulation as able and with emphasis on safety. Pt declining CRP II at this time.  [id number] Rufina Falco, RN BSN 01/15/2019 10:35 AM

## 2019-01-15 NOTE — Progress Notes (Signed)
Echocardiogram 2D Echocardiogram has been performed.  Oneal Deputy Fidela Cieslak 01/15/2019, 9:11 AM

## 2019-01-15 NOTE — Consult Note (Signed)
   [facility] Inpatient Consult   01/15/2019  Jessica [name] 07-22-1933 [id number]   HTA CSNP member  Patient is currently active with [facility] for chronic disease management services.  Patient has been engaged by a [facility] Coordinator.    S/P TAVR  Patient will receive a post hospital follow up call and will be evaluated for ongoing assessments and disease process education.    Plan:  Patient's primary care provider does the transition of care follow up.  Of note, [facility] Care Management services does not replace or interfere with any services that are needed or arranged by inpatient [facility] care management team.  For additional questions or referrals please contact:  Natividad Brood, RN BSN [facility] Liaison  [phone] business mobile phone Toll free office [phone]  Fax number: [phone] [email or web address]

## 2019-01-16 ENCOUNTER — Telehealth: Payer: Self-pay | Admitting: Physician Assistant

## 2019-01-16 ENCOUNTER — Ambulatory Visit: Payer: HMO

## 2019-01-16 ENCOUNTER — Other Ambulatory Visit: Payer: Self-pay

## 2019-01-16 LAB — ECHOCARDIOGRAM COMPLETE
Height: 62 in
Weight: 3855.4 oz

## 2019-01-16 NOTE — Telephone Encounter (Addendum)
  [facility] VALVE TEAM   Patient contacted regarding discharge from [facility] on 10/14  Patient understands to follow up with provider Nell Range on 10/21 at [place].  Patient understands discharge instructions? yes Patient understands medications and regimen? yes Patient understands to bring all medications to this visit? Yes   Has mild soreness in groins but no worsening hematoma. Will continue to monitor and call us back if anything worsens.  Angelena Form PA-C  [facility]

## 2019-01-16 NOTE — Patient Outreach (Signed)
  [facility]) Care Management Chronic Special Needs Program    01/16/2019  Name: Jessica [name], DOB: 05-Feb-1934  MRN: [id number]   Jessica [name] is enrolled in a chronic special needs plan. Client discharged on 01/15/2019. Care plan updated and sent to primary care provider.  Plan: RNCM will send updated care plan to primary care. Utilization management to complete transition of care call. RNCM will continue to follow as client's chronic care management coordinator.  Thea Silversmith, RN, MSN, [facility] [phone]

## 2019-01-17 ENCOUNTER — Ambulatory Visit: Payer: Self-pay

## 2019-01-20 ENCOUNTER — Other Ambulatory Visit: Payer: Self-pay | Admitting: Internal Medicine

## 2019-01-20 ENCOUNTER — Telehealth: Payer: Self-pay

## 2019-01-20 NOTE — Telephone Encounter (Signed)
-----   Message from Mechele Dawley, RN sent at 01/10/2019  5:08 PM EDT ----- Regarding: overdue remote Hi Wayde Gopaul,  Ms. Buchberger is overdue for a [employer] transmission. She has an upcoming procedure on 10/13, so may be best to wait to call her until a few days after that.  Thanks! Raquel Sarna

## 2019-01-20 NOTE — Telephone Encounter (Signed)
Done erx 

## 2019-01-20 NOTE — Telephone Encounter (Signed)
I spoke with the pt to get her to send a manual transmission with her home monitor. The pt states she have not been feeling well since her valve replacement last week. The pt thinks she may have a kidney infection. I told the pt she may want to call her pcp. The pt states her blood pressure been high as well. I told her to let me know when she feeling able to send that manual transmission. I gave her my direct office number to call to get assistance. I told the pt again to make sure she call her pcp to get help about her Kidneys. The pt verbalized understanding.

## 2019-01-20 NOTE — Telephone Encounter (Signed)
Please advise about refill in Dr. Plotnikovs absence. 

## 2019-01-21 ENCOUNTER — Ambulatory Visit: Payer: Self-pay | Admitting: Pharmacist

## 2019-01-22 ENCOUNTER — Ambulatory Visit (INDEPENDENT_AMBULATORY_CARE_PROVIDER_SITE_OTHER): Payer: HMO | Admitting: Physician Assistant

## 2019-01-22 ENCOUNTER — Encounter: Payer: Self-pay | Admitting: Physician Assistant

## 2019-01-22 ENCOUNTER — Other Ambulatory Visit: Payer: Self-pay

## 2019-01-22 ENCOUNTER — Ambulatory Visit (INDEPENDENT_AMBULATORY_CARE_PROVIDER_SITE_OTHER): Payer: HMO | Admitting: *Deleted

## 2019-01-22 VITALS — BP 128/64 | HR 60 | Ht 62.0 in | Wt 246.0 lb

## 2019-01-22 DIAGNOSIS — I10 Essential (primary) hypertension: Secondary | ICD-10-CM | POA: Diagnosis not present

## 2019-01-22 DIAGNOSIS — I480 Paroxysmal atrial fibrillation: Secondary | ICD-10-CM | POA: Diagnosis not present

## 2019-01-22 DIAGNOSIS — Z950 Presence of cardiac pacemaker: Secondary | ICD-10-CM

## 2019-01-22 DIAGNOSIS — I442 Atrioventricular block, complete: Secondary | ICD-10-CM | POA: Diagnosis not present

## 2019-01-22 DIAGNOSIS — I5032 Chronic diastolic (congestive) heart failure: Secondary | ICD-10-CM | POA: Diagnosis not present

## 2019-01-22 DIAGNOSIS — Z952 Presence of prosthetic heart valve: Secondary | ICD-10-CM

## 2019-01-22 DIAGNOSIS — R911 Solitary pulmonary nodule: Secondary | ICD-10-CM | POA: Diagnosis not present

## 2019-01-22 DIAGNOSIS — Z86711 Personal history of pulmonary embolism: Secondary | ICD-10-CM

## 2019-01-22 LAB — CUP PACEART INCLINIC DEVICE CHECK
Battery Impedance: 426 Ohm
Battery Remaining Longevity: 105 mo
Battery Voltage: 2.79 V
Brady Statistic RV Percent Paced: 99.9 %
Date Time Interrogation Session: 20201021194609
Implantable Lead Implant Date: 20140710
Implantable Lead Implant Date: 20140710
Implantable Lead Location: 753859
Implantable Lead Location: 753860
Implantable Lead Model: 5092
Implantable Lead Model: 5592
Implantable Pulse Generator Implant Date: 20140710
Lead Channel Impedance Value: 67 Ohm
Lead Channel Impedance Value: 724 Ohm
Lead Channel Pacing Threshold Amplitude: 0.5 V
Lead Channel Pacing Threshold Amplitude: 1 V
Lead Channel Pacing Threshold Pulse Width: 0.4 ms
Lead Channel Pacing Threshold Pulse Width: 0.4 ms
Lead Channel Sensing Intrinsic Amplitude: 2 mV
Lead Channel Sensing Intrinsic Amplitude: 8 mV
Lead Channel Setting Pacing Amplitude: 2 V
Lead Channel Setting Pacing Amplitude: 2.5 V
Lead Channel Setting Pacing Pulse Width: 0.4 ms
Lead Channel Setting Sensing Sensitivity: 4 mV
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
## 2019-01-22 NOTE — Patient Outreach (Signed)
  [facility]) Care Management Chronic Special Needs Program   01/22/2019  Name: Jessica [name], DOB: 1934/02/16  MRN: [id number]  The client was discussed in today's interdisciplinary care team meeting.  The following issues were discussed:  Client's needs, Changes in health status, Care Plan, Coordination of care and Care transitions  Participants present:                   Thea Silversmith, MSN, RN, CCM                     Melissa Sandlin RN,BSN,CCM, CDE  Kelli Churn, RN, CCM, CDE Quinn Plowman RN, BSN, CCM            Marco Collie, MD Maryella Shivers, MD            Gilda Crease, PharmD, RPh Bary Castilla, RN, BSN, MS, CCM Coralie Carpen, MD  Plan: [facility] will continue to follow as client's C-SNP Care Management Coordinator.   Thea Silversmith, RN, MSN, [place] [phone]

## 2019-01-22 NOTE — Progress Notes (Signed)
Pacemaker check in clinic, added-on during visit with K. [place] Silos, [place], due to dizziness. Normal device function, patient presented programmed VVI, noted to be in NSR, confirmed with EKG. AF previously thought to be permanent, s/p AVN ablation. Discussed with Dr. Rayann Heman, reprogrammed mode to DDD, RA capture management on adaptive, RA sensitivity programmed at 0.32mV per historic settings. Thresholds, sensing, impedances consistent with previous measurements. Device programmed to maximize longevity. 1 high ventricular rate noted--occurred during TAVR on 01/14/19. Device programmed at appropriate safety margins. No noise noted with isometrics. Histogram distribution blunted since recent procedure, rate response likely programmed off at time of procedure, will reassess need for rate response now that patient is in NSR. Estimated longevity 8.5 years. Patient enrolled in remote follow-up, though she is overdue for transmission--[employer] tech support number given. Patient education completed. ROV with Jens Som, PA on 03/17/19.

## 2019-01-22 NOTE — Patient Instructions (Signed)
Medication Instructions:  Your provider recommends that you continue on your current medications as directed. Please refer to the Current Medication list given to you today.    Follow-Up: Please keep all your follow-up appointments!

## 2019-01-23 ENCOUNTER — Ambulatory Visit (INDEPENDENT_AMBULATORY_CARE_PROVIDER_SITE_OTHER): Payer: HMO | Admitting: *Deleted

## 2019-01-23 DIAGNOSIS — I4891 Unspecified atrial fibrillation: Secondary | ICD-10-CM

## 2019-01-23 DIAGNOSIS — I5033 Acute on chronic diastolic (congestive) heart failure: Secondary | ICD-10-CM

## 2019-01-23 DIAGNOSIS — I4892 Unspecified atrial flutter: Secondary | ICD-10-CM | POA: Diagnosis not present

## 2019-01-23 LAB — CUP PACEART REMOTE DEVICE CHECK
Battery Impedance: 450 Ohm
Battery Remaining Longevity: 86 mo
Battery Voltage: 2.79 V
Brady Statistic AP VP Percent: 2 %
Brady Statistic AP VS Percent: 0 %
Brady Statistic AS VP Percent: 98 %
Brady Statistic AS VS Percent: 0 %
Date Time Interrogation Session: 20201022182807
Implantable Lead Implant Date: 20140710
Implantable Lead Implant Date: 20140710
Implantable Lead Location: 753859
Implantable Lead Location: 753860
Implantable Lead Model: 5092
Implantable Lead Model: 5592
Implantable Pulse Generator Implant Date: 20140710
Lead Channel Impedance Value: 526 Ohm
Lead Channel Impedance Value: 774 Ohm
Lead Channel Pacing Threshold Amplitude: 0.5 V
Lead Channel Pacing Threshold Amplitude: 0.625 V
Lead Channel Pacing Threshold Pulse Width: 0.4 ms
Lead Channel Pacing Threshold Pulse Width: 0.4 ms
Lead Channel Setting Pacing Amplitude: 2 V
Lead Channel Setting Pacing Amplitude: 2.5 V
Lead Channel Setting Pacing Pulse Width: 0.4 ms
Lead Channel Setting Sensing Sensitivity: 4 mV

## 2019-01-23 NOTE — Telephone Encounter (Signed)
Jessica [name] saw her in the office 01-22-2019

## 2019-01-23 NOTE — Addendum Note (Signed)
Addended by: Jacinta Shoe on: 01/23/2019 12:25 PM   Modules accepted: Orders

## 2019-01-23 NOTE — Telephone Encounter (Signed)
Assisting patient with sending transmission as remotes are overdue. Manual transmission received, added to schedule for processing.  Patient advised that transmission was successful. She is aware she will receive a letter in the mail with her next transmission date. No further questions at this time.

## 2019-01-27 ENCOUNTER — Ambulatory Visit: Payer: Self-pay | Admitting: Pharmacist

## 2019-02-03 ENCOUNTER — Ambulatory Visit: Payer: Self-pay | Admitting: Pharmacist

## 2019-02-04 NOTE — Progress Notes (Signed)
Remote pacemaker transmission.   

## 2019-02-10 ENCOUNTER — Other Ambulatory Visit: Payer: Self-pay | Admitting: Internal Medicine

## 2019-02-11 ENCOUNTER — Ambulatory Visit: Payer: Self-pay | Admitting: Pharmacist

## 2019-02-12 ENCOUNTER — Other Ambulatory Visit (HOSPITAL_COMMUNITY): Payer: HMO
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
## 2019-02-13 ENCOUNTER — Encounter (INDEPENDENT_AMBULATORY_CARE_PROVIDER_SITE_OTHER): Payer: Self-pay

## 2019-02-13 ENCOUNTER — Ambulatory Visit (INDEPENDENT_AMBULATORY_CARE_PROVIDER_SITE_OTHER): Payer: HMO | Admitting: Physician Assistant

## 2019-02-13 ENCOUNTER — Other Ambulatory Visit: Payer: Self-pay

## 2019-02-13 ENCOUNTER — Ambulatory Visit (HOSPITAL_COMMUNITY): Payer: HMO | Attending: Cardiology

## 2019-02-13 VITALS — BP 128/72 | HR 70 | Ht 62.0 in | Wt 249.0 lb

## 2019-02-13 DIAGNOSIS — Z86711 Personal history of pulmonary embolism: Secondary | ICD-10-CM | POA: Diagnosis not present

## 2019-02-13 DIAGNOSIS — R911 Solitary pulmonary nodule: Secondary | ICD-10-CM

## 2019-02-13 DIAGNOSIS — Z952 Presence of prosthetic heart valve: Secondary | ICD-10-CM | POA: Diagnosis not present

## 2019-02-13 DIAGNOSIS — I480 Paroxysmal atrial fibrillation: Secondary | ICD-10-CM

## 2019-02-13 DIAGNOSIS — Z950 Presence of cardiac pacemaker: Secondary | ICD-10-CM

## 2019-02-13 DIAGNOSIS — I5032 Chronic diastolic (congestive) heart failure: Secondary | ICD-10-CM

## 2019-02-13 DIAGNOSIS — I10 Essential (primary) hypertension: Secondary | ICD-10-CM | POA: Diagnosis not present

## 2019-02-13 NOTE — Patient Instructions (Addendum)
Medication Instructions:  1) STOP ASPIRIN July 15, 2019  Testing/Procedures: Jessica [name] recommends you have a CT SCAN in April, 2021.  Follow-Up: Your provider recommends that you schedule a follow-up appointment in 3 MONTHS

## 2019-02-14 ENCOUNTER — Encounter: Payer: HMO | Admitting: Student

## 2019-02-21 ENCOUNTER — Other Ambulatory Visit: Payer: Self-pay | Admitting: Internal Medicine

## 2019-03-03 ENCOUNTER — Telehealth: Payer: Self-pay | Admitting: Internal Medicine

## 2019-03-03 NOTE — Telephone Encounter (Signed)
Arbie Cookey ask [employer] calling [employer] order formerly [employer] Is calling to ask Dr. Alain Marion is it ok to change the brand of levothyroxine (SYNTHROID) 75 MCG tablet [id number]  to Lannette? Please advise CB- [phone] reference # [id number]

## 2019-03-04 NOTE — Telephone Encounter (Signed)
Jessica [name] is calling back checking status of change brand of medication. Call back [phone] Reference # [id number]

## 2019-03-05 ENCOUNTER — Encounter: Payer: Self-pay | Admitting: Internal Medicine

## 2019-03-05 ENCOUNTER — Other Ambulatory Visit: Payer: Self-pay

## 2019-03-05 ENCOUNTER — Ambulatory Visit (INDEPENDENT_AMBULATORY_CARE_PROVIDER_SITE_OTHER): Payer: HMO | Admitting: Internal Medicine

## 2019-03-05 ENCOUNTER — Other Ambulatory Visit (INDEPENDENT_AMBULATORY_CARE_PROVIDER_SITE_OTHER): Payer: HMO

## 2019-03-05 DIAGNOSIS — E118 Type 2 diabetes mellitus with unspecified complications: Secondary | ICD-10-CM

## 2019-03-05 DIAGNOSIS — I350 Nonrheumatic aortic (valve) stenosis: Secondary | ICD-10-CM

## 2019-03-05 DIAGNOSIS — I10 Essential (primary) hypertension: Secondary | ICD-10-CM | POA: Diagnosis not present

## 2019-03-05 DIAGNOSIS — R42 Dizziness and giddiness: Secondary | ICD-10-CM | POA: Insufficient documentation

## 2019-03-05 DIAGNOSIS — M79672 Pain in left foot: Secondary | ICD-10-CM | POA: Diagnosis not present

## 2019-03-05 DIAGNOSIS — F419 Anxiety disorder, unspecified: Secondary | ICD-10-CM | POA: Diagnosis not present

## 2019-03-05 LAB — BASIC METABOLIC PANEL
BUN: 25 mg/dL — ABNORMAL HIGH (ref 6–23)
CO2: 23 mEq/L (ref 19–32)
Calcium: 10.2 mg/dL (ref 8.4–10.5)
Chloride: 101 mEq/L (ref 96–112)
Creatinine, Ser: 1.01 mg/dL (ref 0.40–1.20)
GFR: 52.29 mL/min — ABNORMAL LOW (ref >60.00)
Glucose, Bld: 176 mg/dL — ABNORMAL HIGH (ref 70–99)
Potassium: 4 mEq/L (ref 3.5–5.1)
Sodium: 137 mEq/L (ref 135–145)

## 2019-03-05 LAB — HEMOGLOBIN A1C: Hgb A1c MFr Bld: 7.4 % — ABNORMAL HIGH (ref 4.6–6.5)

## 2019-03-05 MED ORDER — TEMAZEPAM 15 MG PO CAPS: ORAL_CAPSULE | ORAL | 1 refills | Status: DC

## 2019-03-05 MED ORDER — LORAZEPAM 0.5 MG PO TABS: 0.5000 mg | ORAL_TABLET | Freq: Two times a day (BID) | ORAL | 1 refills | Status: DC

## 2019-03-05 MED ORDER — MECLIZINE HCL 12.5 MG PO TABS: 12.5000 mg | ORAL_TABLET | Freq: Three times a day (TID) | ORAL | 1 refills | Status: DC | PRN

## 2019-03-05 NOTE — Assessment & Plan Note (Addendum)
Benign Positional Vertigo symptoms on the left. Start Meclizine. Start Brandt - Daroff exercise several times a day as dirrected. 

## 2019-03-05 NOTE — Assessment & Plan Note (Signed)
S/p TAVR 10/20

## 2019-03-05 NOTE — Telephone Encounter (Signed)
Pharmacy given okay

## 2019-03-05 NOTE — Assessment & Plan Note (Signed)
L foot arch purple lesion - ?a cholesterol embolus  Will watch

## 2019-03-05 NOTE — Progress Notes (Signed)
Subjective:  Patient ID: Jessica [name], female    DOB: 1934-02-20  Age: 85 y.o. MRN: [id number]  CC: No chief complaint on file.   HPI Jessica [name] presents for severe AS - s/p TAVR - DOE is better C/o L foot painful spot developed after TAVR F/u CHF, HTN and DM  Per hx:  "Jessica [name] is a 85 y.o. female with a history of paroxysmal atrial flutter/fibrillation on Eliquis, chronic combined S/D CHF, high grade AV block & AVN ablation s/p PPM, GERD, depression, HTN, HLD, hypothyroidism, DVT with bilateral pulmonary embolism, diabetes mellitus and severe AS who presented to [facility] on 01/14/19 for planned TAVR.  Patient'scardiac history dates back approximately 6 or 7 years ago when she presented with symptomatic bradycardia related to second-degree AV block. She underwent permanent pacemaker implantation. Echocardiogram at that time revealed normal left ventricular systolic function with mild to moderate aortic stenosis. Interrogation of her pacemaker since that time has documented the presence of recurrent atrial fibrillation and atrial flutter. She is now chronically anticoagulated using Eliquis. She has been followed for the last several years by Dr. Oval Linsey and treated with diuretic therapy for chronic systolic and diastolic CHF. EF down to 30% at one point (on echo 06/2017).She was evaluated by Dr. Rayann Heman for possible CRT upgrade but this was deferred. She has developed gradual progression of symptoms of exertional shortness of breath and fatigue. Recent follow-up echocardiogram revealed significant progression and severity of aortic stenosis with improvement in her left ventricular systolic function to normal (EF 55-60%). She was referred to the multidisciplinary heart valve clinic and has been evaluated previously by Dr. Angelena Form. Diagnostic cardiac catheterization confirmed the presence of severe aortic stenosis and notable for the absence of significant coronary artery  disease.   The patient has been evaluated by the multidisciplinary valve team and felt to have severe, symptomatic aortic stenosis and to be a suitable candidate for TAVR, which was set up for 01/14/19.    Hospital Course     Consultants: none  Severe AS:s/p successful TAVR with a 43m mm Edwards Sapien 3 Ultra THV via the TF approach on 01/14/19. Post operative echo competed but pending formal read. Groin sites are stable. ECG with V paced rhythm. Resume home Eliquis and add baby aspirin 89mdaily after TAVR.  Acute on chronic diastolic CHF: as evidenced by an elevated BNP on preadmission labs and cardiomegaly on CXR. This has been treated with TAVR. Resume home lasix 8072maily. She has a history of reduced EF. Continue Entresto 97-103m76m HTN: BP was labile after surgery and it was difficult to wean her off pressors immediately after surgery. Her BP is now well controlled. Plan to resume home medications.   DMT2: resume home medications including Jardiance and Metformin. Metformin will be held x 48 hours after contrast dye exposure. Okay to resume on 10/15 PM.  Hx of DVT/PE: resume home Eliquis tonight.   Hx of AV node ablation and PPM: followed by Dr. AllrRayann HemanF: resume Eliquis tonight.   Pulmonary nodule: nodule in the inferior segment of the lingula with a mean diameter of 8 mm. Non-contrast chest CT at 6-12 months is recommended. If the nodule is stable at time of repeat CT, then future CT at 18-24 months (from today's scan) is considered optional for low-risk patients, but is recommended for high-risk patients. This will be discussed in the outpatient setting and I will have CT set up. " _____________  Outpatient Medications Prior  to Visit  Medication Sig Dispense Refill   acetaminophen (TYLENOL) 325 MG tablet Take 2 tablets (650 mg total) by mouth every 6 (six) hours as needed for mild pain (or Fever >/= 101).     allopurinol (ZYLOPRIM) 100 MG tablet Take 1  tablet (100 mg total) by mouth daily. 30 tablet 0   apixaban (ELIQUIS) 5 MG TABS tablet Take 1 tablet (5 mg total) by mouth 2 (two) times daily. 180 tablet 3   aspirin 81 MG chewable tablet Chew 1 tablet (81 mg total) by mouth daily.     b complex vitamins tablet Take 1 tablet by mouth daily.     Blood Glucose Monitoring Suppl (TRUE METRIX AIR GLUCOSE METER) w/Device KIT CHECK BLOOD SUGAR TWICE DAILY 1 kit 0   Cholecalciferol 1000 UNITS tablet Take 1,000 Units by mouth daily.       empagliflozin (JARDIANCE) 10 MG TABS tablet Take 10 mg by mouth daily before breakfast.     furosemide (LASIX) 40 MG tablet Take 1 tablet (40 mg total) by mouth 2 (two) times daily. (Patient taking differently: Take 80 mg by mouth daily. ) 180 tablet 3   KLOR-CON M20 20 MEQ tablet TAKE 1 TABLET BY MOUTH EVERY DAY (Patient taking differently: Take 20 mEq by mouth daily. ) 90 tablet 3   levothyroxine (SYNTHROID) 75 MCG tablet Take 1 tablet (75 mcg total) by mouth daily. 90 tablet 3   LORazepam (ATIVAN) 0.5 MG tablet TAKE 1 TABLET BY MOUTH 2 TIMES DAILY. 60 tablet 2   metFORMIN (GLUCOPHAGE) 500 MG tablet Take 1 tablet (500 mg total) by mouth 2 (two) times daily with a meal. 180 tablet 3   metoprolol succinate (TOPROL-XL) 50 MG 24 hr tablet Take 50 mg by mouth daily with breakfast. Take with or immediately following a meal.     Multiple Vitamin (MULTIVITAMIN WITH MINERALS) TABS tablet Take 1 tablet by mouth daily. Women's Centrum     Multiple Vitamins-Minerals (PRESERVISION AREDS 2 PO) Take 1 tablet by mouth 2 (two) times daily.     Polyethyl Glycol-Propyl Glycol (LUBRICANT EYE DROPS) 0.4-0.3 % SOLN Place 1 drop into both eyes 4 (four) times daily as needed (dry/irritated eyes.).     sacubitril-valsartan (ENTRESTO) 97-103 MG Take 1 tablet by mouth 2 (two) times daily.     temazepam (RESTORIL) 15 MG capsule TAKE 1 CAPSULE BY MOUTH AT BEDTIME AS NEEDED FOR SLEEP 30 capsule 3   TRUE METRIX BLOOD GLUCOSE TEST  test strip CHECK BLOOD SUGAR TWICE DAILY 200 each 1   TRUEPLUS LANCETS 33G MISC CHECK BLOOD SUGARS TWICE A DAY 200 each 1   venlafaxine XR (EFFEXOR-XR) 75 MG 24 hr capsule TAKE 1 CAPSULE EVERY DAY WITH BREAKFAST (Patient taking differently: Take 75 mg by mouth daily with breakfast. TAKE 1 CAPSULE EVERY DAY WITH BREAKFAST) 90 capsule 3   No facility-administered medications prior to visit.     ROS: Review of Systems  Constitutional: Positive for fatigue. Negative for activity change, appetite change, chills and unexpected weight change.  HENT: Negative for congestion, mouth sores and sinus pressure.   Eyes: Negative for visual disturbance.  Respiratory: Positive for shortness of breath. Negative for cough and chest tightness.   Gastrointestinal: Negative for abdominal pain and nausea.  Genitourinary: Negative for difficulty urinating, frequency and vaginal pain.  Musculoskeletal: Positive for arthralgias, back pain and gait problem.  Skin: Negative for pallor and rash.  Neurological: Negative for dizziness, tremors, weakness, numbness and headaches.  Psychiatric/Behavioral: Positive  for dysphoric mood. Negative for confusion and sleep disturbance. The patient is nervous/anxious.     Objective:  BP 122/78 (BP Location: Left Arm, Patient Position: Sitting, Cuff Size: Large)    Pulse 97    Temp 98.2 F (36.8 C) (Oral)    Ht '5\' 2"'  (1.575 m)    Wt 248 lb (112.5 kg)    SpO2 96%    BMI 45.36 kg/m   BP Readings from Last 3 Encounters:  03/05/19 122/78  02/13/19 128/72  01/22/19 128/64    Wt Readings from Last 3 Encounters:  03/05/19 248 lb (112.5 kg)  02/13/19 249 lb (112.9 kg)  01/22/19 246 lb (111.6 kg)    Physical Exam Constitutional:      General: She is not in acute distress.    Appearance: She is well-developed. She is obese.  HENT:     Head: Normocephalic.     Right Ear: External ear normal.     Left Ear: External ear normal.     Nose: Nose normal.  Eyes:     General:         Right eye: No discharge.        Left eye: No discharge.     Conjunctiva/sclera: Conjunctivae normal.     Pupils: Pupils are equal, round, and reactive to light.  Neck:     Musculoskeletal: Normal range of motion and neck supple.     Thyroid: No thyromegaly.     Vascular: No JVD.     Trachea: No tracheal deviation.  Cardiovascular:     Rate and Rhythm: Normal rate and regular rhythm.     Heart sounds: Normal heart sounds.  Pulmonary:     Effort: No respiratory distress.     Breath sounds: No stridor. No wheezing.  Abdominal:     General: Bowel sounds are normal. There is no distension.     Palpations: Abdomen is soft. There is no mass.     Tenderness: There is no abdominal tenderness. There is no guarding or rebound.  Musculoskeletal:        General: No tenderness.     Right lower leg: Edema present.     Left lower leg: Edema present.  Lymphadenopathy:     Cervical: No cervical adenopathy.  Skin:    Findings: No erythema or rash.  Neurological:     Cranial Nerves: No cranial nerve deficit.     Motor: No abnormal muscle tone.     Coordination: Coordination normal.     Deep Tendon Reflexes: Reflexes normal.  Psychiatric:        Behavior: Behavior normal.        Thought Content: Thought content normal.        Judgment: Judgment normal.   B trace edema L foot arch purple lesion  Lab Results  Component Value Date   WBC 6.7 01/15/2019   HGB 13.4 01/15/2019   HCT 40.3 01/15/2019   PLT 214 01/15/2019   GLUCOSE 142 (H) 01/15/2019   CHOL 191 02/19/2017   TRIG 183.0 (H) 02/19/2017   HDL 38.90 (L) 02/19/2017   LDLDIRECT 155.9 09/30/2012   LDLCALC 116 (H) 02/19/2017   ALT 22 01/10/2019   AST 25 01/10/2019   NA 136 01/15/2019   K 4.2 01/15/2019   CL 106 01/15/2019   CREATININE 0.70 01/15/2019   BUN 15 01/15/2019   CO2 20 (L) 01/15/2019   TSH 2.771 06/22/2017   INR 1.2 01/10/2019   HGBA1C 7.3 (H) 01/10/2019  MICROALBUR 4.5 (H) 12/02/2009    Dg Chest 2  View  Result Date: 01/11/2019 CLINICAL DATA:  Preop for TAVR EXAM: CHEST - 2 VIEW COMPARISON:  Radiograph 06/22/2017, CT 10/07/2008 FINDINGS: Pacer pack overlies the left chest wall. Leads in stable position at the right atrium and cardiac apex. Mild cardiomegaly is similar to prior. Coarsened interstitial changes in the lung bases are similar to prior. No consolidation, features of edema, pneumothorax, or effusion. No acute osseous or soft tissue abnormality. IMPRESSION: 1. Chronic coarse interstitial changes. No acute cardiopulmonary disease. 2. Stable mild cardiomegaly Electronically Signed   By: Lovena Le M.D.   On: 01/11/2019 03:01    Assessment & Plan:   There are no diagnoses linked to this encounter.   No orders of the defined types were placed in this encounter.    Follow-up: No follow-ups on file.  Jessica Kehr, MD

## 2019-03-05 NOTE — Patient Instructions (Signed)
Benign Positional Vertigo symptoms on the left. Start Meclizine. Start Brandt - Daroff exercise several times a day as dirrected. 

## 2019-03-05 NOTE — Assessment & Plan Note (Signed)
Triamt/HCTZ, Losartan 

## 2019-03-05 NOTE — Assessment & Plan Note (Signed)
Metformin Labs 

## 2019-03-14 ENCOUNTER — Other Ambulatory Visit: Payer: Self-pay | Admitting: Cardiovascular Disease
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
## 2019-03-17 ENCOUNTER — Encounter: Payer: HMO | Admitting: Physician Assistant

## 2019-04-25 ENCOUNTER — Telehealth: Payer: Self-pay

## 2019-04-25 NOTE — Telephone Encounter (Signed)
Unable to speak  with patient to remind of missed remote transmission 

## 2019-04-30 ENCOUNTER — Other Ambulatory Visit: Payer: Self-pay | Admitting: Pharmacist

## 2019-04-30 ENCOUNTER — Other Ambulatory Visit: Payer: Self-pay | Admitting: Pharmacy Technician

## 2019-04-30 ENCOUNTER — Telehealth: Payer: Self-pay

## 2019-04-30 NOTE — Telephone Encounter (Signed)
Paperwork signed by KT and faxed to number given.

## 2019-04-30 NOTE — Patient Outreach (Signed)
[facility]) Care Management  [place]   04/30/2019  Jessica [name] [birth year] [id number]  Reason for referral: Medication Assistance  Referral source: [employer] C-SNP Care Manager with [facility] Current insurance: [employer] C-SNP  [facility] pharmacy assisted patient with patient assistance program applications in 1856 for [place], Eliquis, and Jardiance.  Patient requesting assistance with renewals for 2021.    PMHx includes but not limited to:  Hypothyroidism, T2DM, HTN / HLD, anxiety, GERD, severe aortic stenosis s/p TAVR 10/'20, atrial fibrillation on chronic anticoagulation, hx DVT/PE, CHF, high grade AV block and AVN ablation s/p PPM  Outreach:  Successful telephone call with patient.  HIPAA identifiers verified.   Subjective:  Patient reports she continues to have HTA [employer].  She reports that she was already renewed for [facility] patient assistance program through [employer] through 2021 but needs to re-apply for Jardiance and Eliquis.  She states she has about 10 day supply left of Jardiance and ~1.5 months left of Eliquis.  She denies having any other questions or concerns regarding medications.    Objective: The ASCVD Risk score Mikey Bussing DC Jr., et al., 2013) failed to calculate for the following reasons:   The 2013 ASCVD risk score is only valid for ages 69 to 25  Lab Results  Component Value Date   CREATININE 1.01 03/05/2019   CREATININE 0.70 01/15/2019   CREATININE 0.80 01/14/2019    Lab Results  Component Value Date   HGBA1C 7.4 (H) 03/05/2019    Lipid Panel     Component Value Date/Time   CHOL 191 02/19/2017 1001   TRIG 183.0 (H) 02/19/2017 1001   HDL 38.90 (L) 02/19/2017 1001   CHOLHDL 5 02/19/2017 1001   VLDL 36.6 02/19/2017 1001   LDLCALC 116 (H) 02/19/2017 1001   LDLDIRECT 155.9 09/30/2012 0836    BP Readings from Last 3 Encounters:  03/05/19 122/78  02/13/19 128/72  01/22/19 128/64     Allergies  Allergen Reactions  . Coreg [Carvedilol] Other (See Comments)    Weak legs  . Relafen [Nabumetone] Other (See Comments)    Upset stomach  . Amiodarone Other (See Comments)    Patient reported intolerance to Amiodarone with worsened tremor and stopped pta. (hand tremors)  . Atorvastatin     Myalgias  . Calcium Other (See Comments)    unknown  . Codeine Other (See Comments)    unknown  . Rofecoxib Other (See Comments)    Unknown (vioxx)  . Simvastatin     Myalgias  . Enalapril Maleate Other (See Comments)    REACTION: cough    Medications Reviewed Today    Reviewed by [name], MD (Physician) on 03/05/19 at 1128  Med List Status: <None>  Medication Order Taking? Sig Documenting Provider Last Dose Status Informant  acetaminophen (TYLENOL) 325 MG tablet [id number] Yes Take 2 tablets (650 mg total) by mouth every 6 (six) hours as needed for mild pain (or Fever >/= 101). Eugenie Filler, MD Taking Active Self  allopurinol (ZYLOPRIM) 100 MG tablet [id number] Yes Take 1 tablet (100 mg total) by mouth daily. Plotnikov, Evie Lacks, MD Taking Active Self  apixaban (ELIQUIS) 5 MG TABS tablet [id number] Yes Take 1 tablet (5 mg total) by mouth 2 (two) times daily. Plotnikov, Evie Lacks, MD Taking Active Self           Med Note Eileen Stanford   Wed Jan 15, 2019  9:48 AM)    aspirin  81 MG chewable tablet [id number] Yes Chew 1 tablet (81 mg total) by mouth daily. Eileen Stanford, PA-C Taking Active   b complex vitamins tablet [id number] Yes Take 1 tablet by mouth daily. [provider] Taking Active Self  Blood Glucose Monitoring Suppl (TRUE METRIX AIR GLUCOSE METER) w/Device KIT [id number] Yes CHECK BLOOD SUGAR TWICE DAILY Plotnikov, Evie Lacks, MD Taking Active Self  Cholecalciferol 1000 UNITS tablet [id number] Yes Take 1,000 Units by mouth daily.   [provider] Taking Active Self  empagliflozin (JARDIANCE) 10 MG TABS tablet [id number] Yes Take 10  mg by mouth daily before breakfast. [name], MD Taking Active   furosemide (LASIX) 40 MG tablet [id number] Yes Take 1 tablet (40 mg total) by mouth 2 (two) times daily.  Patient taking differently: Take 80 mg by mouth daily.    Plotnikov, Evie Lacks, MD Taking Active Self  KLOR-CON M20 20 MEQ tablet [id number] Yes TAKE 1 TABLET BY MOUTH EVERY DAY  Patient taking differently: Take 20 mEq by mouth daily.    Skeet Latch, MD Taking Active Self  levothyroxine (SYNTHROID) 75 MCG tablet [id number] Yes Take 1 tablet (75 mcg total) by mouth daily. Plotnikov, Evie Lacks, MD Taking Active Self  LORazepam (ATIVAN) 0.5 MG tablet [id number] Yes TAKE 1 TABLET BY MOUTH 2 TIMES DAILY. Plotnikov, Evie Lacks, MD Taking Active   meclizine (ANTIVERT) 12.5 MG tablet [id number]  Take 1-2 tablets (12.5-25 mg total) by mouth 3 (three) times daily as needed for dizziness or nausea. Plotnikov, Evie Lacks, MD  Active   metFORMIN (GLUCOPHAGE) 500 MG tablet [id number] Yes Take 1 tablet (500 mg total) by mouth 2 (two) times daily with a meal. Plotnikov, Evie Lacks, MD Taking Active Self  metoprolol succinate (TOPROL-XL) 50 MG 24 hr tablet [id number] Yes Take 50 mg by mouth daily with breakfast. Take with or immediately following a meal. [provider] Taking Active Self  Multiple Vitamin (MULTIVITAMIN WITH MINERALS) TABS tablet [id number] Yes Take 1 tablet by mouth daily. Women's Centrum [provider] Taking Active Self  Multiple Vitamins-Minerals (PRESERVISION AREDS 2 PO) [id number] Yes Take 1 tablet by mouth 2 (two) times daily. [provider] Taking Active Self  Polyethyl Glycol-Propyl Glycol (LUBRICANT EYE DROPS) 0.4-0.3 % SOLN [id number] Yes Place 1 drop into both eyes 4 (four) times daily as needed (dry/irritated eyes.). [provider] Taking Active Self  sacubitril-valsartan (ENTRESTO) 97-103 MG [id number] Yes Take 1 tablet by mouth 2 (two) times daily. [provider]  Taking Active   temazepam (RESTORIL) 15 MG capsule [id number] Yes TAKE 1 CAPSULE BY MOUTH AT BEDTIME AS NEEDED FOR SLEEP Plotnikov, Evie Lacks, MD Taking Active   TRUE METRIX BLOOD GLUCOSE TEST test strip [id number] Yes CHECK BLOOD SUGAR TWICE DAILY Plotnikov, Evie Lacks, MD Taking Active Self  TRUEPLUS LANCETS 33G MISC [id number] Yes CHECK BLOOD SUGARS TWICE A DAY Plotnikov, Evie Lacks, MD Taking Active Self  venlafaxine XR (EFFEXOR-XR) 75 MG 24 hr capsule [id number] Yes TAKE 1 CAPSULE EVERY DAY WITH BREAKFAST  Patient taking differently: Take 75 mg by mouth daily with breakfast. TAKE 1 CAPSULE EVERY DAY WITH BREAKFAST   Plotnikov, Evie Lacks, MD Taking Active Self  Med List Note Dennie Fetters, LPN 35/36/14 4315):            Assessment: Drugs sorted by system:  Neurologic/Psychologic: lorazepam, meclizine, temazepam, venlafazine  Hematologic: apixaban  Cardiovascular: furosemide, metoprolol succinate, sacubitril-valsartan  Endocrine: empagliflozin, metformin, levothyroxine  Topical: lubricant eye drops  Pain: acetaminophen  Vitamins/Minerals/Supplements: MVI, B complex vitamin, cholecalciferol, preservision AREDs, potassium  Miscellaneous: allopurinol  Medication Review Findings:  Duplicate therapy with benzodiazepines, patient taking lorazepam during the day and temazepam at night, monitor use closely as benzodiazapines on BEERs list for due to increased risk of cognitive impairment, delirium, falls, fractures, and motor vehicle crashes . A1C remains stable 7.4, patient continues checking FBG   Medication Assistance Findings:  Medication assistance needs identified: Jardiance, Eliquis  Extra Help:  May be eligible for Partial Extra Help Low Income Subsidy based on reported income and assets  Patient Assistance Programs: Jardiance made by [place] requirement met: Yes o Out-of-pocket prescription expenditure met:   Not Applicable - Patient has met  application requirements to apply for this program.  - Reviewed program requirements with patient.   - BI may require denial letter for Extra Help since reported income is close to cut-off for Partial Extra Help.  Will assist patient with applying. Reviewed with patient that it is very important she keep her denial letter in case we need it for BI application (similar to 2020) - Recommended patient pick up 30 day supply of medication at pharmacy as application process may take at least 4 weeks and she will run out in 10 days.  Medication is on Tier 6 formulary, therefore co-pay is $0.  Although co-pay is $0, it will help patient stay out of coverage gap longer by Jardiance getting through PAP and therefore Eliquis co-pay will be more affordable for her for longer duration of the year  Eliquis made by BMS o Income requirement met: Yes o Out-of-pocket prescription expenditure met:   No (3% household income) - Patient has not met application requirements to apply for this program at this time.  - Reviewed program requirements with patient.   - Reviewed co-pay through HTA may have cost savings for her with 90 day supply / $90 rather than paying $45 / 30 day supply each month.  Patient voiced understanding.   - Reviewed TROOP requirement, patient may meet this later in the year, but currently not eligible  Plan: . Assisted patient with applying for Extra Help.  . I will route patient assistance letter to [place] technician who will coordinate patient assistance program application process for medications listed above.  [facility] pharmacy technician will assist with obtaining all required documents from both patient and provider(s) and submit application(s) once completed.   . Provided patient with my contact information if she needs to reach out to me . Will f/u with patient quarterly for medication needs and sooner as needed for assistance with PAPs  Ralene Bathe, PharmD, [place] [phone]

## 2019-04-30 NOTE — Patient Outreach (Addendum)
[facility]) Care Management  04/30/2019  ETOSHA HRBEK 14-Dec-1933 [id number]                                       Medication Assistance Referral  Referral From: [facility] RPh Waynard Reeds  Medication/Company: [name] / [employer] Patient application portion:  Mailed Provider application portion: Faxed  to Dr. Purnell Shoemaker Provider address/fax verified via: Office website  Medication/Company: Arne Cleveland / [employer] Patient application portion:  Mailed Provider application portion: Faxed  to K. Grandville Silos, PA-C Provider address/fax verified via: Office website   Follow up:  Will follow up with patient in 10-14 business days to confirm application(s) have been received.  [name] [place] Certified Pharmacy Technician [place] Management Direct [phone]

## 2019-04-30 NOTE — Telephone Encounter (Signed)
**Note De-Identified Mckala Pantaleon Obfuscation** We received the provider part of a BMS Pt Asst application [facility] fax from Etter Sjogren, CPhT with [facility] for the pts Eliquis.  I have completed the MD part of the pt asst application, scanned and emailed it to Angelena Form, PA-c's nurse, Valetta Fuller with request to have Angelena Form sign and date application and to then fax back to [place] at [phone].

## 2019-05-20 ENCOUNTER — Other Ambulatory Visit: Payer: Self-pay | Admitting: Pharmacist

## 2019-05-20 NOTE — Patient Outreach (Signed)
[facility]) Care Management  [place] 05/20/2019  Jessica [name] 1933/06/05 [id number]  Incoming call and VM received from patient.  Return call placed.  Patient reports she has not yet received patient assistance program applications for Eliquis and Jardiance.  She has run out of Jardiance medication.   Per review of HTA C-SNP formularly, Jessica [name] is Tier 6 - $0 co-pay.  Patient advised to pick this medication up from pharmacy if she has run out.     Due to mail issues, patient agreeable to go to PCP office tomorrow to pick up faxed copy of applications.  She can fill out and give back to office to fax back to [facility].  Patient aware she will need to bring proof of income documents.    Plan: Will update [facility] pharmacy technician who will fax over documents tomorrow AM to PCP Dr. Jearld Pies, PharmD, [place] [phone]

## 2019-05-21 ENCOUNTER — Telehealth: Payer: Self-pay | Admitting: Emergency Medicine

## 2019-05-21 NOTE — Telephone Encounter (Signed)
Pt wanted to let you know to be on the look out for some paperwork that is being sent over. It is in reference to her Jardiance medication. Thanks.

## 2019-05-22 ENCOUNTER — Ambulatory Visit (INDEPENDENT_AMBULATORY_CARE_PROVIDER_SITE_OTHER): Payer: HMO | Admitting: *Deleted

## 2019-05-22 DIAGNOSIS — I4892 Unspecified atrial flutter: Secondary | ICD-10-CM | POA: Diagnosis not present

## 2019-05-22 DIAGNOSIS — I4891 Unspecified atrial fibrillation: Secondary | ICD-10-CM | POA: Diagnosis not present

## 2019-05-22 LAB — CUP PACEART REMOTE DEVICE CHECK
Battery Impedance: 551 Ohm
Battery Remaining Longevity: 84 mo
Battery Voltage: 2.79 V
Brady Statistic AP VP Percent: 11 %
Brady Statistic AP VS Percent: 0 %
Brady Statistic AS VP Percent: 89 %
Brady Statistic AS VS Percent: 0 %
Date Time Interrogation Session: 20210218152353
Implantable Lead Implant Date: 20140710
Implantable Lead Implant Date: 20140710
Implantable Lead Location: 753859
Implantable Lead Location: 753860
Implantable Lead Model: 5092
Implantable Lead Model: 5592
Implantable Pulse Generator Implant Date: 20140710
Lead Channel Impedance Value: 518 Ohm
Lead Channel Impedance Value: 744 Ohm
Lead Channel Pacing Threshold Amplitude: 0.5 V
Lead Channel Pacing Threshold Amplitude: 0.75 V
Lead Channel Pacing Threshold Pulse Width: 0.4 ms
Lead Channel Pacing Threshold Pulse Width: 0.4 ms
Lead Channel Setting Pacing Amplitude: 2 V
Lead Channel Setting Pacing Amplitude: 2.5 V
Lead Channel Setting Pacing Pulse Width: 0.4 ms
Lead Channel Setting Sensing Sensitivity: 4 mV

## 2019-05-23 NOTE — Progress Notes (Signed)
PPM Remote  

## 2019-05-26 NOTE — Telephone Encounter (Signed)
Forms given to pt to fill out

## 2019-05-28 ENCOUNTER — Ambulatory Visit: Payer: HMO | Admitting: Cardiovascular Disease

## 2019-05-28 ENCOUNTER — Encounter (INDEPENDENT_AMBULATORY_CARE_PROVIDER_SITE_OTHER): Payer: Self-pay

## 2019-05-28 ENCOUNTER — Other Ambulatory Visit: Payer: Self-pay

## 2019-05-28 ENCOUNTER — Encounter: Payer: Self-pay | Admitting: Cardiovascular Disease

## 2019-05-28 VITALS — BP 124/70 | HR 99 | Ht 63.0 in | Wt 263.0 lb

## 2019-05-28 DIAGNOSIS — I10 Essential (primary) hypertension: Secondary | ICD-10-CM

## 2019-05-28 DIAGNOSIS — E7800 Pure hypercholesterolemia, unspecified: Secondary | ICD-10-CM

## 2019-05-28 DIAGNOSIS — R609 Edema, unspecified: Secondary | ICD-10-CM

## 2019-05-28 DIAGNOSIS — Z952 Presence of prosthetic heart valve: Secondary | ICD-10-CM | POA: Diagnosis not present

## 2019-05-28 NOTE — Patient Instructions (Addendum)
COVID-19 Vaccine Information can be found at: [email or web address] For questions related to vaccine distribution or appointments, please email [email or web address] or call [phone].   Medication Instructions:  Your physician recommends that you continue on your current medications as directed. Please refer to the Current Medication list given to you today.  *If you need a refill on your cardiac medications before your next appointment, please call your pharmacy*  Lab Work: BNP WHEN YOU GO SEE YOUR PRIMARY CARE   Testing/Procedures: NONE  Follow-Up: At [facility], you and your health needs are our priority.  As part of our continuing mission to provide you with exceptional heart care, we have created designated Provider Care Teams.  These Care Teams include your primary Cardiologist (physician) and Advanced Practice Providers (APPs -  Physician Assistants and Nurse Practitioners) who all work together to provide you with the care you need, when you need it.  Your next appointment:   6 month(s) You will receive a reminder letter in the mail two months in advance. If you don't receive a letter, please call our office to schedule the follow-up appointment.   The format for your next appointment:   Either In Person or Virtual  Provider:   You may see Skeet Latch, MD or one of the following Advanced Practice Providers on your designated Care Team:    Kerin Ransom, PA-C  [place], [place]  Coletta Memos, [place]
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
## 2019-05-31 ENCOUNTER — Other Ambulatory Visit: Payer: Self-pay | Admitting: Cardiovascular Disease

## 2019-06-01 ENCOUNTER — Ambulatory Visit: Payer: HMO | Attending: Internal Medicine

## 2019-06-01 DIAGNOSIS — Z23 Encounter for immunization: Secondary | ICD-10-CM

## 2019-06-01 NOTE — Progress Notes (Signed)
   Covid-19 Vaccination Clinic  Name:  Jessica [name]    MRN: [id number] DOB: [birth year]  06/01/2019  [name] was observed post Covid-19 immunization for 30 minutes based on pre-vaccination screening without incidence. She was provided with Vaccine Information Sheet and instruction to access the V-Safe system.   [name] was instructed to call 911 with any severe reactions post vaccine: [name] Difficulty breathing  . Swelling of your face and throat  . A fast heartbeat  . A bad rash all over your body  . Dizziness and weakness    Immunizations Administered    Name Date Dose VIS Date Route   Pfizer COVID-19 Vaccine 06/01/2019  3:06 PM 0.3 mL 03/14/2019 Intramuscular   Manufacturer: [place]   Lot: [id number]   [place]: [id number]

## 2019-06-03 ENCOUNTER — Ambulatory Visit: Payer: Self-pay

## 2019-06-04 ENCOUNTER — Other Ambulatory Visit: Payer: Self-pay

## 2019-06-04 ENCOUNTER — Ambulatory Visit (INDEPENDENT_AMBULATORY_CARE_PROVIDER_SITE_OTHER): Payer: HMO | Admitting: Internal Medicine

## 2019-06-04 ENCOUNTER — Encounter: Payer: Self-pay | Admitting: Internal Medicine

## 2019-06-04 ENCOUNTER — Other Ambulatory Visit: Payer: Self-pay | Admitting: *Deleted

## 2019-06-04 DIAGNOSIS — I4892 Unspecified atrial flutter: Secondary | ICD-10-CM

## 2019-06-04 DIAGNOSIS — E118 Type 2 diabetes mellitus with unspecified complications: Secondary | ICD-10-CM | POA: Diagnosis not present

## 2019-06-04 DIAGNOSIS — K219 Gastro-esophageal reflux disease without esophagitis: Secondary | ICD-10-CM

## 2019-06-04 DIAGNOSIS — I4891 Unspecified atrial fibrillation: Secondary | ICD-10-CM

## 2019-06-04 LAB — BASIC METABOLIC PANEL
BUN: 17 mg/dL (ref 6–23)
CO2: 24 mEq/L (ref 19–32)
Calcium: 10.2 mg/dL (ref 8.4–10.5)
Chloride: 101 mEq/L (ref 96–112)
Creatinine, Ser: 0.91 mg/dL (ref 0.40–1.20)
GFR: 58.94 mL/min — ABNORMAL LOW (ref >60.00)
Glucose, Bld: 218 mg/dL — ABNORMAL HIGH (ref 70–99)
Potassium: 4 mEq/L (ref 3.5–5.1)
Sodium: 136 mEq/L (ref 135–145)

## 2019-06-04 LAB — HEMOGLOBIN A1C: Hgb A1c MFr Bld: 8.8 % — ABNORMAL HIGH (ref 4.6–6.5)

## 2019-06-04 NOTE — Addendum Note (Signed)
Addended by: Cresenciano Lick on: 06/04/2019 11:57 AM   Modules accepted: Orders

## 2019-06-04 NOTE — Assessment & Plan Note (Signed)
Toprol, Eliquis 

## 2019-06-04 NOTE — Assessment & Plan Note (Signed)
Wt Readings from Last 3 Encounters:  06/04/19 254 lb (115.2 kg)  05/28/19 263 lb (119.3 kg)  03/05/19 248 lb (112.5 kg)  on diet

## 2019-06-04 NOTE — Progress Notes (Signed)
Subjective:  Patient ID: Jessica [name], female    DOB: 11-09-1933  Age: 86 y.o. MRN: [id number]  CC: No chief complaint on file.   HPI JESELLE HISER presents for CHF, HTN, obesity f/o C/o COVID isolation, depression     Outpatient Medications Prior to Visit  Medication Sig Dispense Refill  . acetaminophen (TYLENOL) 325 MG tablet Take 2 tablets (650 mg total) by mouth every 6 (six) hours as needed for mild pain (or Fever >/= 101).    [name] allopurinol (ZYLOPRIM) 100 MG tablet Take 1 tablet (100 mg total) by mouth daily. 30 tablet 0  . apixaban (ELIQUIS) 5 MG TABS tablet Take 1 tablet (5 mg total) by mouth 2 (two) times daily. 180 tablet 3  . b complex vitamins tablet Take 1 tablet by mouth daily.    . Blood Glucose Monitoring Suppl (TRUE METRIX AIR GLUCOSE METER) w/Device KIT CHECK BLOOD SUGAR TWICE DAILY (Patient taking differently: daily. ) 1 kit 0  . Cholecalciferol 1000 UNITS tablet Take 1,000 Units by mouth daily.      . empagliflozin (JARDIANCE) 10 MG TABS tablet Take 10 mg by mouth daily before breakfast.    . furosemide (LASIX) 40 MG tablet Take 1 tablet (40 mg total) by mouth 2 (two) times daily. (Patient taking differently: Take 80 mg by mouth daily. ) 180 tablet 3  . KLOR-CON M20 20 MEQ tablet TAKE 1 TABLET BY MOUTH EVERY DAY 90 tablet 3  . levothyroxine (SYNTHROID) 75 MCG tablet Take 1 tablet (75 mcg total) by mouth daily. 90 tablet 3  . LORazepam (ATIVAN) 0.5 MG tablet Take 1 tablet (0.5 mg total) by mouth 2 (two) times daily. 180 tablet 1  . meclizine (ANTIVERT) 12.5 MG tablet Take 1-2 tablets (12.5-25 mg total) by mouth 3 (three) times daily as needed for dizziness or nausea. 60 tablet 1  . metFORMIN (GLUCOPHAGE) 500 MG tablet Take 1 tablet (500 mg total) by mouth 2 (two) times daily with a meal. 180 tablet 3  . metoprolol succinate (TOPROL-XL) 50 MG 24 hr tablet TAKE 1 TABLET (50 MG TOTAL) BY MOUTH DAILY. TAKE WITH OR IMMEDIATELY FOLLOWING A MEAL. 90 tablet 3  .  Multiple Vitamin (MULTIVITAMIN WITH MINERALS) TABS tablet Take 1 tablet by mouth daily. Women's Centrum    . Multiple Vitamins-Minerals (PRESERVISION AREDS 2 PO) Take 1 tablet by mouth 2 (two) times daily.    Vladimir Faster Glycol-Propyl Glycol (LUBRICANT EYE DROPS) 0.4-0.3 % SOLN Place 1 drop into both eyes 4 (four) times daily as needed (dry/irritated eyes.).    [name] sacubitril-valsartan (ENTRESTO) 97-103 MG Take 1 tablet by mouth 2 (two) times daily.    . temazepam (RESTORIL) 15 MG capsule TAKE 1 CAPSULE BY MOUTH AT BEDTIME AS NEEDED FOR SLEEP 90 capsule 1  . TRUE METRIX BLOOD GLUCOSE TEST test strip CHECK BLOOD SUGAR TWICE DAILY 200 each 1  . TRUEPLUS LANCETS 33G MISC CHECK BLOOD SUGARS TWICE A DAY 200 each 1  . venlafaxine XR (EFFEXOR-XR) 75 MG 24 hr capsule TAKE 1 CAPSULE EVERY DAY WITH BREAKFAST (Patient taking differently: Take 75 mg by mouth daily with breakfast. TAKE 1 CAPSULE EVERY DAY WITH BREAKFAST) 90 capsule 3   No facility-administered medications prior to visit.    ROS: Review of Systems  Constitutional: Positive for unexpected weight change. Negative for activity change, appetite change, chills and fatigue.  HENT: Negative for congestion, mouth sores and sinus pressure.   Eyes: Negative for visual disturbance.  Respiratory: Negative  for cough and chest tightness.   Gastrointestinal: Negative for abdominal pain and nausea.  Genitourinary: Negative for difficulty urinating, frequency and vaginal pain.  Musculoskeletal: Positive for arthralgias, back pain and gait problem.  Skin: Negative for pallor and rash.  Neurological: Negative for dizziness, tremors, weakness, numbness and headaches.  Psychiatric/Behavioral: Positive for sleep disturbance. Negative for confusion and suicidal ideas. The patient is nervous/anxious.     Objective:  BP 132/68 (BP Location: Left Arm, Patient Position: Sitting, Cuff Size: Large)   Pulse 85   Temp 98.4 F (36.9 C) (Oral)   Ht '5\' 3"'  (1.6 m)    Wt 254 lb (115.2 kg)   SpO2 96%   BMI 44.99 kg/m   BP Readings from Last 3 Encounters:  06/04/19 132/68  05/28/19 124/70  03/05/19 122/78    Wt Readings from Last 3 Encounters:  06/04/19 254 lb (115.2 kg)  05/28/19 263 lb (119.3 kg)  03/05/19 248 lb (112.5 kg)    Physical Exam Constitutional:      General: She is not in acute distress.    Appearance: She is well-developed. She is obese.  HENT:     Head: Normocephalic.     Right Ear: External ear normal.     Left Ear: External ear normal.     Nose: Nose normal.  Eyes:     General:        Right eye: No discharge.        Left eye: No discharge.     Conjunctiva/sclera: Conjunctivae normal.     Pupils: Pupils are equal, round, and reactive to light.  Neck:     Thyroid: No thyromegaly.     Vascular: No JVD.     Trachea: No tracheal deviation.  Cardiovascular:     Rate and Rhythm: Normal rate. Rhythm irregular.     Heart sounds: Normal heart sounds.  Pulmonary:     Effort: No respiratory distress.     Breath sounds: No stridor. No wheezing.  Abdominal:     General: Bowel sounds are normal. There is no distension.     Palpations: Abdomen is soft. There is no mass.     Tenderness: There is no abdominal tenderness. There is no guarding or rebound.  Musculoskeletal:        General: No tenderness.     Cervical back: Normal range of motion and neck supple.  Lymphadenopathy:     Cervical: No cervical adenopathy.  Skin:    Findings: No erythema or rash.  Neurological:     Mental Status: She is oriented to person, place, and time.     Cranial Nerves: No cranial nerve deficit.     Motor: No abnormal muscle tone.     Coordination: Coordination normal.     Deep Tendon Reflexes: Reflexes normal.  Psychiatric:        Behavior: Behavior normal.        Thought Content: Thought content normal.        Judgment: Judgment normal.   cat scratches on skin  Lab Results  Component Value Date   WBC 6.7 01/15/2019   HGB 13.4  01/15/2019   HCT 40.3 01/15/2019   PLT 214 01/15/2019   GLUCOSE 176 (H) 03/05/2019   CHOL 191 02/19/2017   TRIG 183.0 (H) 02/19/2017   HDL 38.90 (L) 02/19/2017   LDLDIRECT 155.9 09/30/2012   LDLCALC 116 (H) 02/19/2017   ALT 22 01/10/2019   AST 25 01/10/2019   NA 137 03/05/2019   K  4.0 03/05/2019   CL 101 03/05/2019   CREATININE 1.01 03/05/2019   BUN 25 (H) 03/05/2019   CO2 23 03/05/2019   TSH 2.771 06/22/2017   INR 1.2 01/10/2019   HGBA1C 7.4 (H) 03/05/2019   MICROALBUR 4.5 (H) 12/02/2009    DG Chest 2 View  Result Date: 01/11/2019 CLINICAL DATA:  Preop for TAVR EXAM: CHEST - 2 VIEW COMPARISON:  Radiograph 06/22/2017, CT 10/07/2008 FINDINGS: Pacer pack overlies the left chest wall. Leads in stable position at the right atrium and cardiac apex. Mild cardiomegaly is similar to prior. Coarsened interstitial changes in the lung bases are similar to prior. No consolidation, features of edema, pneumothorax, or effusion. No acute osseous or soft tissue abnormality. IMPRESSION: 1. Chronic coarse interstitial changes. No acute cardiopulmonary disease. 2. Stable mild cardiomegaly Electronically Signed   By: Lovena Le M.D.   On: 01/11/2019 03:01    Assessment & Plan:   There are no diagnoses linked to this encounter.   No orders of the defined types were placed in this encounter.    Follow-up: No follow-ups on file.  Walker Kehr, MD

## 2019-06-04 NOTE — Assessment & Plan Note (Signed)
Labs

## 2019-06-04 NOTE — Assessment & Plan Note (Signed)
Cont w/wt loss 

## 2019-06-12 ENCOUNTER — Other Ambulatory Visit: Payer: Self-pay | Admitting: Internal Medicine

## 2019-06-18 ENCOUNTER — Other Ambulatory Visit: Payer: Self-pay | Admitting: Pharmacy Technician

## 2019-06-18 NOTE — Patient Outreach (Addendum)
[facility]) Care Management  06/18/2019  Jessica [name] 19-Oct-1933 [id number]   Incoming from [place] stating that patient left her a voicemail inquiring about the status of patient assistance application for Eliquis.  Successful call made to MS. Mcmurry, HIPAA identifiers verified, informed her that I received out of pocket spend report from [facility] and she has spent about $15 in 2021 so far. Informed her that in order to apply to [place] patient assistance she would would need to spend about $400. Requested that she contact myself or Colleen when she has spent that amount on all scripts combined.  She states that she is out of Jardiance and I informed her that she would need to pick that script up from her local pharmacy, she is unsure if she has refills on the medication.  Sent message to Dr. Enis Slipper office requesting scripts for [name] and Eliquis be sent in to patients local pharmacy.  Will remove myself from care team and send message to [facility] to update.  [name] Duncan Certified Pharmacy Technician [place] Management Direct Dial:[phone]   Agree with above.  Will close [facility] pharmacy case for now.  Am happy to assist once patient has met TROOP for Eliquis PAP.  Will update PCP.    Ralene Bathe, PharmD, [place] [phone]

## 2019-06-23 ENCOUNTER — Telehealth: Payer: Self-pay

## 2019-06-23 MED ORDER — ALLOPURINOL 100 MG PO TABS: 100.0000 mg | ORAL_TABLET | Freq: Every day | ORAL | 0 refills | Status: DC

## 2019-06-23 NOTE — Telephone Encounter (Signed)
1.Medication Requested:allopurinol (ZYLOPRIM) 100 MG tablet  2. Pharmacy (Name, Street[facility]):[id number] [place] [place] [facility] [place]  3. On Med List: Yes   4. Last Visit with PCP: 3.3.21   5. Next visit date with PCP: 6.8.21    Agent: Please be advised that RX refills may take up to 3 business days. We ask that you follow-up with your pharmacy.

## 2019-06-23 NOTE — Telephone Encounter (Signed)
RX sent

## 2019-06-26 ENCOUNTER — Other Ambulatory Visit: Payer: Self-pay

## 2019-06-26 NOTE — Patient Outreach (Signed)
  [facility]) Care Management Chronic Special Needs Program    06/26/2019  Name: Jessica [name], DOB: 02-11-1934  MRN: [id number]   Ms. Jessica [name] is enrolled in a chronic special needs plan for Heart Failure. RNCM called to review health risk assessment and update care plan. No answer. HIPAA compliant message left at [phone]. Unable to leave message at [phone].   Plan: Chronic care management coordinator will attempt outreach within 2-3 weeks.  Thea Silversmith, RN, MSN, [place] [phone]

## 2019-07-01 ENCOUNTER — Ambulatory Visit: Payer: HMO | Attending: Internal Medicine

## 2019-07-01 DIAGNOSIS — Z23 Encounter for immunization: Secondary | ICD-10-CM

## 2019-07-01 NOTE — Progress Notes (Signed)
   Covid-19 Vaccination Clinic  Name:  Jessica [name]    MRN: [id number] DOB: 04-May-1933  07/01/2019  Jessica [name] was observed post Covid-19 immunization for 30 minutes based on pre-vaccination screening without incident. She was provided with Vaccine Information Sheet and instruction to access the V-Safe system.   Jessica [name] was instructed to call 911 with any severe reactions post vaccine: [name] Difficulty breathing  . Swelling of face and throat  . A fast heartbeat  . A bad rash all over body  . Dizziness and weakness   Immunizations Administered    Name Date Dose VIS Date Route   Pfizer COVID-19 Vaccine 07/01/2019  2:18 PM 0.3 mL 03/14/2019 Intramuscular   Manufacturer: [place]   Lot: [id number]   Mar-Mac: [id number]

## 2019-07-03 ENCOUNTER — Other Ambulatory Visit: Payer: Self-pay

## 2019-07-03 NOTE — Patient Outreach (Signed)
Jessica [name]) Care Management Chronic Special Needs Program  07/03/2019  Name: Jessica [name] DOB: [birth year]  MRN: [id number]  Ms. Jessica [name] is enrolled in a chronic special needs plan. Chronic Care Management Coordinator telephoned client to review health risk assessment and to update individualized care plan. RNCM reviewed the chronic care management program, importance of client participation, and taking their care plan to all provider appointments and inpatient facilities.    Subjective: client reports history of diabetes, Heart disease, congestive heart failure, Hypertension, atrial fibrillation.   Client reports A1C of 7.3, however A1C per chart was 8.8 on 06/04/19. Blood sugar this morning was 136. Client seem surprised stating, "it has never been that high". Client is not sure if she has informed primary care that she has not been taking Januvia. Client also reports that she has not been taking Januvia due to cost and reports she tried to call the person who helped her before, but has not been able to reach her. Client request medication assistance.  Client reports last visit with cardiologist was February 24th. She denies any signs/symptoms of heart failure exacerbation.   Medications reviewed: client reports she is in need of Januvia and has not been taking. Client reports she has Elliquis, but will soon be out of this medication. She reports she has been trying to call the person who assisted her in obtaining the medication, but has been unable to reach her. Client is asking for medication assistance.   Goals Addressed            This Visit's Progress   .  Acknowledge receipt of Advanced Directive package   On track    Client is not sure if she received. Request another copy.  Goal renewed 2021: Mailed another copy of advance directive. Mailed education: "advance directive". Please review and call if you have any questions.    . COMPLETED: Advanced  Care Planning complete as directed by client       Client reports will complete at her leisure.    . COMPLETED: Be as active and as independent as possible prior goal       Client continues to be active and independent with activities of daily living.    . Client verbalize knowledge of Heart Failure disease self management skills within 6 months.   On track    Know signs and symptoms of congestive heart failure exacerbation. Know when to call the doctor to who to call. Verbalize how fluid intake can affect congestive heart failure. Eat healthy and monitor salt intake. Visit your primary care or cardiologist as scheduled. Verbalize how daily weights can help with management of heart failure. Mailed education,"heart failure, adult". Please review and call if you have any questions.    . Client will report no worsening of symptoms of Atrial Fibrillation within the next 6 months   On track    Denies any symptoms.  Goal renewed 2021 Please follow up with your provider as scheduled. Please take medications as prescribed. Signs/symptoms of worsening: racing or irregular heart beat that may be uncomfortable shortness of breath with our without chest pain weakness; dizziness. Review the atrial fibrillation action plan located in your [employer] Calendar.    . Client will report no worsening of symptoms related to heart disease within the next 6 months    On track    Reports decrease in shortness of breath post TAVR procedure in October.  Goal renewed 2021: Continue to take medications as  prescribed. Continue to attend provider visits as scheduled    . Client will verbalize knowledge of diabetes self-management as evidenced by Hgb A1C <7 or as defined by provider. (pt-stated)   Not on track    A1C 8.8 on 06/04/2019  Goal renewed 2021 Diabetes self management actions:  Glucose monitoring per provider recommendations  Eat Healthy  Visit provider every 3-6 months as  directed  Hbg A1C level every 3-6 months. Ask your doctor, "what is my Target A1C goal?" Ask your doctor, "what is my Target blood sugar goal". Please take your medication as prescribed.    . Client will verbalize knowledge of self management of Hypertension as evidences by BP reading of 140/90 or less; or as defined by provider   On track    Follow up with your doctor as scheduled. Take your medications as prescribed by your doctor. Ask your doctor "what is my target blood pressure range". Monitor your blood pressure and take results to your doctor's appointment.  Monitor the amount of salt you are eating. Continue to exercise as tolerated and remain active. Eat heart healthy diet (full of fruits, vegetables, whole grains, lean protein, water--limit salt, fat, and sugar/simple carb intake). Take medications as prescribed.    . COMPLETED: Decrease the use of [name] emergency department related to heart failure within the next year       No use of emergency room due to heart failure.    . COMPLETED: General - Client will not be readmitted within 30 days (C-SNP)discharged 01/15/2019       No readmissions.    [name] HEMOGLOBIN A1C < 7.0       Diabetes self management actions:  Glucose monitoring per provider recommendations  Eat Healthy  Visit provider every 3-6 months as directed  Hbg A1C level every 3-6 months.  Ask your doctor, "what is my Target A1C goal?"  Ask your doctor, "what is my Target blood sugar range?"    . Maintain timely refills of diabetic medication as prescribed within the year .   On track    Please notify your primary care provider that you are not taking this medication, if he has not been notified.  I have sent a referral to the [facility] for medication assistance. Please call your CSNP care coordinator if you need any additional assistance.     . Obtain annual  Lipid Profile, LDL-C   On track    Unable to determine  Goal 2021: It  is important to follow up with your provider for recommended labs. Mailed education: "Lipids". Please review and call if you have any questions.      . Obtain Annual Eye (retinal)  Exam    On track    It is important to follow up with your provider for recommended yearly exam.    . Obtain Annual Foot Exam   On track    It is important to follow up with your provider for recommended yearly exam.    . Obtain annual screen for micro albuminuria (urine) , nephropathy (kidney problems)   On track    It is important to follow up with  your provider for recommended labs. This test looks at how your kidneys are working. Mailed education: "urine albumin test". Please review and call if you have any questions.    . Obtain Hemoglobin A1C at least 2 times per year   On track    Done - 06/04/19 8.8; 03/05/19 A1C 7.4; 7.5 on  09/03/2018; 7.3 on 06/03/2018  It is important to follow up with your provider for recommended labs.    . COMPLETED: paient -prior goal       Stay joyful and healthy. Client reports she continues to stay joyful and working on staying healthy.    . Patient Stated-prior goal   Not on track    Increase my physical activity by doing chair exercises, walk more.  Client reports she does not want to continue with this goal.     . Patient Stated: communicate with [facility] pharmacy regarding medication assistance. (pt-stated)   On track    Client reports having difficulty obtaining medication Januvia. And will need assistance with Elliquis soon.  I have sent a referral to [facility] for medication assistance. Please call your CSNP care coordinator [phone]) if you need additional assistance.    . COMPLETED: Visit Primary Care Provider or Cardiologist at least 2 times per year   On track    Primary care visit 06/03/2018; 09/03/2018; 12/04/2018 and 06/04/2019 Cardiology visit 12/16/2018; 02/03/2019 and 05/28/19      . Visit Primary Care Provider or Endocrinologist at least 2  times per year    On track    It is important to follow up with your provider for recommended labs, procedures and medication refills.      Discussed Covid precautions. Client reports she has received her covid vaccinations. Encouraged client to continue to wear mask, wait at least 6 feet and hand washing.    Client changed to diabetes program due to increasing A1C and medication assistance needs.  Plan: RNCM will send updated care plan to client, send updated care plan to primary care. Send Neurosurgeon. Referral made to [facility] pharmacy.      Thea Silversmith, RN, MSN, [place] [phone]

## 2019-07-08 ENCOUNTER — Other Ambulatory Visit: Payer: Self-pay

## 2019-07-08 ENCOUNTER — Other Ambulatory Visit: Payer: Self-pay | Admitting: Cardiovascular Disease

## 2019-07-08 ENCOUNTER — Other Ambulatory Visit: Payer: Self-pay | Admitting: Internal Medicine

## 2019-07-08 NOTE — Telephone Encounter (Signed)
    1.Medication Requested:apixaban (ELIQUIS) 5 MG TABS tablet  2. Pharmacy (Name[facility] Street[id number] [place] [place]  3. On Med List: yes  4. Last Visit with PCP:  06/04/19  5. Next visit date with PCP: 09/09/19   Agent: Please be advised that RX refills may take up to 3 business days. We ask that you follow-up with your pharmacy.

## 2019-07-08 NOTE — Telephone Encounter (Signed)
Pt calling requesting a refill on jardiance. Would Dr. Angelena Form like to refill this medication? Please address

## 2019-07-08 NOTE — Patient Outreach (Signed)
  [facility]) Care Management Chronic Special Needs Program  07/08/2019  Name: Jessica [name] DOB: Jun 20, 1933  MRN: [id number]  Ms. [name] is enrolled in a chronic special needs plan for Heart Failure. Reviewed and updated care plan.  RNCM called client to follow up and clarify medication assistance needs. Client reports she does not think she is in the donut hole. RNCM asked if client has called pharmacy for a refill and client reports she does not know if she is in need of prescription refill or if she still has refills on file. RN[facility] [address] [facility] [address] client has refills on Elliquis and reported that [name] is not [facility] with [facility].   Client also reports she is in need of a meter that is covered by plan and request one touch.    Goals Addressed            This Visit's Progress   . Maintain timely refills of diabetic medication as prescribed within the year .       Called pharmacy for clarification of refill needs Called primary care provider regarding prescription refill needs: one touch meter, Jardiance, elliquis.       Prescription for Elliquis being refilled RNCM called to primary care provider office and left message. Awaiting a call back re: Jardiance- client need: prescription sent to client's pharmacist One touch meter- client need: prescription sent to client's pharmcy  Plan: RNCM will continue to follow.   Thea Silversmith, RN, MSN, [place] [phone]   .

## 2019-07-08 NOTE — Telephone Encounter (Signed)
Spoke with Denton Brick from [facility], she stated pt was needing a refill for Jardiance 10mg  tablet. Also she need an rx for a one touch meter for her blood glucose

## 2019-07-08 NOTE — Patient Outreach (Signed)
  [place] [facility]) Care Management Chronic Special Needs Program    07/08/2019  Name: Jessica [name], DOB: Mar 23, 1934  MRN: [id number]   [name] is enrolled in a chronic special needs plan for Heart Failure.  RNCM received return call from primary care office-spoke with [facility]. Discussed client need for medication refill for Jardiance and one touch glucose meter.  Per chart review,Jardiance may have been initiated per cardiology. Lonn [place] states will send over request to see if primary care will send prescription to client's pharmacy.  RNCM spoke with Etter Sjogren, CMA with [facility] pharmacy- who reports client did not have an initial prescription at pharmacy because client received her initial medication through patient assistance.  Plan: await return call from [facility] for confirmation that prescriptions approved by primary care. Continue to follow and update client as indicated.  Thea Silversmith, RN, MSN, [place] [phone]

## 2019-07-08 NOTE — Telephone Encounter (Signed)
New Message      *STAT* If patient is at the pharmacy, call can be transferred to refill team.   1. Which medications need to be refilled? (please list name of each medication and dose if known) empagliflozin (JARDIANCE) 10 MG TABS tablet  2. Which pharmacy/location (including street and city if local pharmacy) is medication to be sent to? [id number] [place] [name] - [address] AT [place]  3. Do they need a 30 day or 90 day supply? [place]

## 2019-07-09 ENCOUNTER — Other Ambulatory Visit: Payer: Self-pay

## 2019-07-09 ENCOUNTER — Telehealth: Payer: Self-pay

## 2019-07-09 ENCOUNTER — Ambulatory Visit (INDEPENDENT_AMBULATORY_CARE_PROVIDER_SITE_OTHER)
Admission: RE | Admit: 2019-07-09 | Discharge: 2019-07-09 | Disposition: A | Discharge status: Home / Self Care | Payer: HMO | Source: Ambulatory Visit | Attending: Physician Assistant | Admitting: Physician Assistant

## 2019-07-09 DIAGNOSIS — R911 Solitary pulmonary nodule: Secondary | ICD-10-CM

## 2019-07-09 DIAGNOSIS — R918 Other nonspecific abnormal finding of lung field: Secondary | ICD-10-CM | POA: Diagnosis not present

## 2019-07-09 MED ORDER — JARDIANCE 10 MG PO TABS: 10.0000 mg | ORAL_TABLET | Freq: Every day | ORAL | 3 refills | Status: DC

## 2019-07-09 MED ORDER — APIXABAN 5 MG PO TABS: 5.0000 mg | ORAL_TABLET | Freq: Two times a day (BID) | ORAL | 3 refills | Status: DC

## 2019-07-09 NOTE — Telephone Encounter (Signed)
Reviewed chart pt is up-to-date sent refills to pof..Johny Chess                0

## 2019-07-09 NOTE — Telephone Encounter (Signed)
I agree that primary care should fill this going forward. Thanks.

## 2019-07-09 NOTE — Telephone Encounter (Signed)
Pt informed to have PCP refill.  She will contact them for request.

## 2019-07-09 NOTE — Telephone Encounter (Signed)
The patient has been notified of the Chest CT result and verbalized understanding.  All questions (if any) were answered. Frederik Schmidt, RN 07/09/2019 1:22 PM

## 2019-07-09 NOTE — Telephone Encounter (Signed)
-----   Message from Eileen Stanford, PA-C sent at 07/09/2019 12:58 PM EDT ----- Follow up CT for pulmonary nodule shows a stable nodule and can be considered benign. No further follow up recommended.

## 2019-07-14 ENCOUNTER — Other Ambulatory Visit: Payer: Self-pay

## 2019-07-14 ENCOUNTER — Telehealth: Payer: Self-pay | Admitting: Internal Medicine

## 2019-07-14 NOTE — Telephone Encounter (Signed)
° ° °  Patient states her insurance only covers One Touch  Diabetic supplies. Patient needs glucose monitor, test strips and lancets.  [id number] [place], [place] [facility] [place]

## 2019-07-14 NOTE — Patient Outreach (Addendum)
  [facility]) Care Management Chronic Special Needs Program    07/14/2019  Name: Jessica [name], DOB: 05-14-1933  MRN: [id number]   Jessica [name] is enrolled in a chronic special needs plan.   RNCM called client to follow up regarding medications Jardiance and Elliquis. Client reports she has received both the Jardiance medication and a refill on her Elliquis. Client denies medication assistance needs at this time. No additional referral made. RNCM encouraged client to call RNCM if she has any additional needs.  Client reports she has not received her one touch, but has called her primary care office to request. Client reports she will follow up with the pharmacy to see if the prescription has been called in and if not she will call  Her primary care provider.  Goals Addressed            This Visit's Progress   . Maintain timely refills of diabetic medication as prescribed within the year .   On track    Client reports she has received her diabetic medications. Client to follow up with her pharmacist to see if a prescription for her glucose meter has been called in.  Please call your CSNP nurse care coordinator [phone]), if you have difficulty obtaining your glucose meter.      Plan: outreach to client within 6 months.  Thea Silversmith, RN, MSN, [place] [phone]

## 2019-07-16 ENCOUNTER — Other Ambulatory Visit: Payer: Self-pay | Admitting: Internal Medicine

## 2019-07-17 MED ORDER — ONETOUCH ULTRA VI STRP: ORAL_STRIP | 3 refills | Status: DC

## 2019-07-17 MED ORDER — ONETOUCH ULTRA CONTROL VI SOLN: 0 refills | Status: DC

## 2019-07-17 MED ORDER — ONETOUCH ULTRA 2 W/DEVICE KIT: PACK | 0 refills | Status: DC

## 2019-07-17 MED ORDER — ONETOUCH ULTRASOFT LANCETS MISC: 3 refills | Status: DC

## 2019-07-17 NOTE — Telephone Encounter (Signed)
RX sent

## 2019-08-21 ENCOUNTER — Ambulatory Visit (INDEPENDENT_AMBULATORY_CARE_PROVIDER_SITE_OTHER): Payer: HMO | Admitting: *Deleted

## 2019-08-21 DIAGNOSIS — I442 Atrioventricular block, complete: Secondary | ICD-10-CM

## 2019-08-22 ENCOUNTER — Telehealth: Payer: Self-pay

## 2019-08-22 LAB — CUP PACEART REMOTE DEVICE CHECK
Battery Impedance: 575 Ohm
Battery Remaining Longevity: 83 mo
Battery Voltage: 2.79 V
Brady Statistic AP VP Percent: 9 %
Brady Statistic AP VS Percent: 0 %
Brady Statistic AS VP Percent: 91 %
Brady Statistic AS VS Percent: 0 %
Date Time Interrogation Session: 20210521105740
Implantable Lead Implant Date: 20140710
Implantable Lead Implant Date: 20140710
Implantable Lead Location: 753859
Implantable Lead Location: 753860
Implantable Lead Model: 5092
Implantable Lead Model: 5592
Implantable Pulse Generator Implant Date: 20140710
Lead Channel Impedance Value: 518 Ohm
Lead Channel Impedance Value: 765 Ohm
Lead Channel Pacing Threshold Amplitude: 0.5 V
Lead Channel Pacing Threshold Amplitude: 0.625 V
Lead Channel Pacing Threshold Pulse Width: 0.4 ms
Lead Channel Pacing Threshold Pulse Width: 0.4 ms
Lead Channel Setting Pacing Amplitude: 2 V
Lead Channel Setting Pacing Amplitude: 2.5 V
Lead Channel Setting Pacing Pulse Width: 0.4 ms
Lead Channel Setting Sensing Sensitivity: 4 mV

## 2019-08-22 NOTE — Telephone Encounter (Signed)
Spoke with patient to remind of missed remote transmission 

## 2019-08-25 NOTE — Progress Notes (Signed)
Remote pacemaker transmission.   

## 2019-08-28 ENCOUNTER — Ambulatory Visit: Payer: Self-pay | Admitting: Pharmacist

## 2019-09-03 ENCOUNTER — Other Ambulatory Visit: Payer: Self-pay | Admitting: *Deleted

## 2019-09-03 MED ORDER — LEVOTHYROXINE SODIUM 75 MCG PO TABS: 75.0000 ug | ORAL_TABLET | Freq: Every day | ORAL | 3 refills | Status: DC

## 2019-09-09 ENCOUNTER — Ambulatory Visit (INDEPENDENT_AMBULATORY_CARE_PROVIDER_SITE_OTHER): Payer: HMO | Admitting: Internal Medicine

## 2019-09-09 ENCOUNTER — Encounter: Payer: Self-pay | Admitting: Internal Medicine

## 2019-09-09 ENCOUNTER — Other Ambulatory Visit: Payer: Self-pay

## 2019-09-09 DIAGNOSIS — E118 Type 2 diabetes mellitus with unspecified complications: Secondary | ICD-10-CM | POA: Diagnosis not present

## 2019-09-09 DIAGNOSIS — Z952 Presence of prosthetic heart valve: Secondary | ICD-10-CM | POA: Diagnosis not present

## 2019-09-09 DIAGNOSIS — E039 Hypothyroidism, unspecified: Secondary | ICD-10-CM | POA: Diagnosis not present

## 2019-09-09 DIAGNOSIS — L308 Other specified dermatitis: Secondary | ICD-10-CM | POA: Diagnosis not present

## 2019-09-09 DIAGNOSIS — I10 Essential (primary) hypertension: Secondary | ICD-10-CM | POA: Diagnosis not present

## 2019-09-09 DIAGNOSIS — F419 Anxiety disorder, unspecified: Secondary | ICD-10-CM

## 2019-09-09 DIAGNOSIS — L309 Dermatitis, unspecified: Secondary | ICD-10-CM | POA: Insufficient documentation

## 2019-09-09 LAB — HEMOGLOBIN A1C: Hgb A1c MFr Bld: 9.6 % — ABNORMAL HIGH (ref 4.6–6.5)

## 2019-09-09 LAB — BASIC METABOLIC PANEL
BUN: 25 mg/dL — ABNORMAL HIGH (ref 6–23)
CO2: 25 mEq/L (ref 19–32)
Calcium: 10.1 mg/dL (ref 8.4–10.5)
Chloride: 99 mEq/L (ref 96–112)
Creatinine, Ser: 1.03 mg/dL (ref 0.40–1.20)
GFR: 51.05 mL/min — ABNORMAL LOW (ref >60.00)
Glucose, Bld: 336 mg/dL — ABNORMAL HIGH (ref 70–99)
Potassium: 4.1 mEq/L (ref 3.5–5.1)
Sodium: 133 mEq/L — ABNORMAL LOW (ref 135–145)

## 2019-09-09 MED ORDER — TRIAMCINOLONE ACETONIDE 0.5 % EX OINT: 1.0000 "application " | TOPICAL_OINTMENT | Freq: Three times a day (TID) | CUTANEOUS | 1 refills | Status: AC

## 2019-09-09 NOTE — Assessment & Plan Note (Signed)
Better Wt Readings from Last 3 Encounters:  09/09/19 254 lb 2 oz (115.3 kg)  06/04/19 254 lb (115.2 kg)  05/28/19 263 lb (119.3 kg)

## 2019-09-09 NOTE — Assessment & Plan Note (Signed)
Better  

## 2019-09-09 NOTE — Assessment & Plan Note (Signed)
Triamt/HCTZ, Losartan 

## 2019-09-09 NOTE — Addendum Note (Signed)
Addended by: [name] on: 09/09/2019 11:48 AM   Modules accepted: Orders

## 2019-09-09 NOTE — Assessment & Plan Note (Signed)
On Levothroid 

## 2019-09-09 NOTE — Progress Notes (Signed)
Subjective:  Patient ID: Jessica [name], female    DOB: June 10, 1933  Age: 86 y.o. MRN: [id number]  CC: No chief complaint on file.   HPI Jessica [name] presents for HTN, CRF, CHF f/u C/o rash on the LLE  Outpatient Medications Prior to Visit  Medication Sig Dispense Refill  . acetaminophen (TYLENOL) 325 MG tablet Take 2 tablets (650 mg total) by mouth every 6 (six) hours as needed for mild pain (or Fever >/= 101).    [name] allopurinol (ZYLOPRIM) 100 MG tablet TAKE 1 TABLET BY MOUTH EVERY DAY 30 tablet 11  . apixaban (ELIQUIS) 5 MG TABS tablet Take 1 tablet (5 mg total) by mouth 2 (two) times daily. 180 tablet 3  . b complex vitamins tablet Take 1 tablet by mouth daily.    . Blood Glucose Calibration (OT ULTRA/FASTTK CNTRL SOLN) SOLN Use as directed 1 each 0  . Blood Glucose Monitoring Suppl (ONE TOUCH ULTRA 2) w/Device KIT Use as instructed to check blood sugar BID 1 kit 0  . Cholecalciferol 1000 UNITS tablet Take 1,000 Units by mouth daily.      . empagliflozin (JARDIANCE) 10 MG TABS tablet Take 10 mg by mouth daily before breakfast. 90 tablet 3  . furosemide (LASIX) 40 MG tablet Take 1 tablet (40 mg total) by mouth 2 (two) times daily. (Patient taking differently: Take 80 mg by mouth daily. ) 180 tablet 3  . glucose blood (ONETOUCH ULTRA) test strip Use as instructed to check blood sugar BID 200 each 3  . KLOR-CON M20 20 MEQ tablet TAKE 1 TABLET BY MOUTH EVERY DAY 90 tablet 3  . Lancets (ONETOUCH ULTRASOFT) lancets Use as instructed to check blood sugar BID 200 each 3  . levothyroxine (SYNTHROID) 75 MCG tablet Take 1 tablet (75 mcg total) by mouth daily. 90 tablet 3  . LORazepam (ATIVAN) 0.5 MG tablet Take 1 tablet (0.5 mg total) by mouth 2 (two) times daily. 180 tablet 1  . meclizine (ANTIVERT) 12.5 MG tablet Take 1-2 tablets (12.5-25 mg total) by mouth 3 (three) times daily as needed for dizziness or nausea. 60 tablet 1  . metFORMIN (GLUCOPHAGE) 500 MG tablet Take 1 tablet (500 mg  total) by mouth 2 (two) times daily with a meal. 180 tablet 3  . metoprolol succinate (TOPROL-XL) 50 MG 24 hr tablet TAKE 1 TABLET (50 MG TOTAL) BY MOUTH DAILY. TAKE WITH OR IMMEDIATELY FOLLOWING A MEAL. 90 tablet 3  . Multiple Vitamin (MULTIVITAMIN WITH MINERALS) TABS tablet Take 1 tablet by mouth daily. Women's Centrum    . Multiple Vitamins-Minerals (PRESERVISION AREDS 2 PO) Take 1 tablet by mouth 2 (two) times daily.    Vladimir Faster Glycol-Propyl Glycol (LUBRICANT EYE DROPS) 0.4-0.3 % SOLN Place 1 drop into both eyes 4 (four) times daily as needed (dry/irritated eyes.).    [name] sacubitril-valsartan (ENTRESTO) 97-103 MG Take 1 tablet by mouth 2 (two) times daily.    . temazepam (RESTORIL) 15 MG capsule TAKE 1 CAPSULE BY MOUTH AT BEDTIME AS NEEDED FOR SLEEP 30 capsule 5  . venlafaxine XR (EFFEXOR-XR) 75 MG 24 hr capsule TAKE 1 CAPSULE EVERY DAY WITH BREAKFAST (Patient taking differently: Take 75 mg by mouth daily with breakfast. TAKE 1 CAPSULE EVERY DAY WITH BREAKFAST) 90 capsule 3   No facility-administered medications prior to visit.    ROS: Review of Systems  Constitutional: Negative for activity change, appetite change, chills, fatigue and unexpected weight change.  HENT: Negative for congestion, mouth sores and  sinus pressure.   Eyes: Negative for visual disturbance.  Respiratory: Negative for cough and chest tightness.   Gastrointestinal: Negative for abdominal pain and nausea.  Genitourinary: Negative for difficulty urinating, frequency and vaginal pain.  Musculoskeletal: Negative for back pain and gait problem.  Skin: Positive for rash. Negative for pallor.  Neurological: Negative for dizziness, tremors, weakness, numbness and headaches.  Psychiatric/Behavioral: Negative for confusion and sleep disturbance.    Objective:  BP 140/90 (BP Location: Left Arm, Patient Position: Sitting, Cuff Size: Large)   Pulse (!) 108   Temp 98.1 F (36.7 C) (Oral)   Ht '5\' 3"'  (1.6 m)   Wt 254 lb 2  oz (115.3 kg)   SpO2 96%   BMI 45.02 kg/m   BP Readings from Last 3 Encounters:  09/09/19 140/90  06/04/19 132/68  05/28/19 124/70    Wt Readings from Last 3 Encounters:  09/09/19 254 lb 2 oz (115.3 kg)  06/04/19 254 lb (115.2 kg)  05/28/19 263 lb (119.3 kg)    Physical Exam Constitutional:      General: She is not in acute distress.    Appearance: She is well-developed. She is obese.  HENT:     Head: Normocephalic.     Right Ear: External ear normal.     Left Ear: External ear normal.     Nose: Nose normal.  Eyes:     General:        Right eye: No discharge.        Left eye: No discharge.     Conjunctiva/sclera: Conjunctivae normal.     Pupils: Pupils are equal, round, and reactive to light.  Neck:     Thyroid: No thyromegaly.     Vascular: No JVD.     Trachea: No tracheal deviation.  Cardiovascular:     Rate and Rhythm: Normal rate and regular rhythm.     Heart sounds: Normal heart sounds.  Pulmonary:     Effort: No respiratory distress.     Breath sounds: No stridor. No wheezing.  Abdominal:     General: Bowel sounds are normal. There is no distension.     Palpations: Abdomen is soft. There is no mass.     Tenderness: There is no abdominal tenderness. There is no guarding or rebound.  Musculoskeletal:        General: Tenderness present.     Cervical back: Normal range of motion and neck supple.  Lymphadenopathy:     Cervical: No cervical adenopathy.  Skin:    Findings: Rash present. No erythema.  Neurological:     Cranial Nerves: No cranial nerve deficit.     Motor: No abnormal muscle tone.     Coordination: Coordination normal.     Gait: Gait abnormal.     Deep Tendon Reflexes: Reflexes normal.  Psychiatric:        Behavior: Behavior normal.        Thought Content: Thought content normal.        Judgment: Judgment normal.       Eczema type patchy rash on the LLE  Lab Results  Component Value Date   WBC 6.7 01/15/2019   HGB 13.4 01/15/2019    HCT 40.3 01/15/2019   PLT 214 01/15/2019   GLUCOSE 218 (H) 06/04/2019   CHOL 191 02/19/2017   TRIG 183.0 (H) 02/19/2017   HDL 38.90 (L) 02/19/2017   LDLDIRECT 155.9 09/30/2012   LDLCALC 116 (H) 02/19/2017   ALT 22 01/10/2019   AST 25  01/10/2019   NA 136 06/04/2019   K 4.0 06/04/2019   CL 101 06/04/2019   CREATININE 0.91 06/04/2019   BUN 17 06/04/2019   CO2 24 06/04/2019   TSH 2.771 06/22/2017   INR 1.2 01/10/2019   HGBA1C 8.8 (H) 06/04/2019   MICROALBUR 4.5 (H) 12/02/2009    CT Chest Wo Contrast  Result Date: 07/09/2019 CLINICAL DATA:  Follow-up of inferior lingular nodule. EXAM: CT CHEST WITHOUT CONTRAST TECHNIQUE: Multidetector CT imaging of the chest was performed following the standard protocol without IV contrast. COMPARISON:  CTA of the chest of 01/07/2019. FINDINGS: Cardiovascular: Aortic atherosclerosis. Aortic valve repair. Tortuous thoracic aorta. Mild cardiomegaly, without pericardial effusion. Pacer. Lad coronary artery calcification. Pulmonary artery enlargement, outflow tract 3.1 cm Mediastinum/Nodes: No mediastinal or definite hilar adenopathy, given limitations of unenhanced CT. Moderate hiatal hernia. Lungs/Pleura: No pleural fluid. Lingular scarring. Lingular nodule of 7 mm on 72/3 is similar on the prior exam and back on 10/07/2008, considered benign. Posterior left upper lobe calcified granulomas. Upper Abdomen: Hepatic steatosis. Normal imaged portions of the spleen, pancreas, adrenal glands, left kidney. Musculoskeletal: Advanced lower thoracic spondylosis. Eccentric right T12 vertebral hemangioma. IMPRESSION: 1. Similar lingular nodule, including back to 10/07/2008. This can be considered benign. 2. Pulmonary artery enlargement suggests pulmonary arterial hypertension. 3. Moderate hiatal hernia. 4. Hepatic steatosis. 5. Coronary artery atherosclerosis. Aortic Atherosclerosis (ICD10-I70.0). Electronically Signed   By: Abigail Miyamoto M.D.   On: 07/09/2019 12:40     Assessment & Plan:    Walker Kehr, MD

## 2019-09-09 NOTE — Addendum Note (Signed)
Addended by: Boris Lown B on: 09/09/2019 11:26 AM   Modules accepted: Orders

## 2019-09-09 NOTE — Assessment & Plan Note (Signed)
Labs

## 2019-09-09 NOTE — Assessment & Plan Note (Signed)
psoriatic LLE patches Triamc oint tid

## 2019-09-12 ENCOUNTER — Other Ambulatory Visit: Payer: Self-pay | Admitting: Internal Medicine

## 2019-09-12 NOTE — Telephone Encounter (Signed)
[place] Controlled Database Checked Last filled:03/05/2019 180 LOV w/you: 09/09/2019 Next appt w/you: 12/10/2019

## 2019-09-16 ENCOUNTER — Telehealth: Payer: Self-pay

## 2019-09-16 NOTE — Telephone Encounter (Deleted)
Error

## 2019-10-08 ENCOUNTER — Telehealth: Payer: Self-pay | Admitting: Cardiovascular Disease

## 2019-10-08 NOTE — Telephone Encounter (Signed)
Are you aware of this paperwork?? Will route to primary nurse to advise.

## 2019-10-08 NOTE — Telephone Encounter (Signed)
Patient called and wanted to advise Dr. Oval Linsey that she tried to get a refill on sacubitril-valsartan (ENTRESTO) 97-103 MG but states that [facility] in [place] called her and told her that papers were sent to the patient and to Dr. Oval Linsey and those papers have not been returned? Patient states she never received papers. Please call.

## 2019-10-10 ENCOUNTER — Other Ambulatory Visit: Payer: Self-pay

## 2019-10-10 ENCOUNTER — Telehealth: Payer: Self-pay | Admitting: Internal Medicine

## 2019-10-10 ENCOUNTER — Telehealth: Payer: Self-pay | Admitting: Cardiovascular Disease

## 2019-10-10 MED ORDER — ENTRESTO 97-103 MG PO TABS: 1.0000 | ORAL_TABLET | Freq: Two times a day (BID) | ORAL | 11 refills | Status: DC

## 2019-10-10 NOTE — Telephone Encounter (Signed)
Spoke with pt who report she went to pick up her Entresto refill from pharmacy but was told insurance won't cover it because they faxed a request to provider.   Will forward to nurse for review.

## 2019-10-10 NOTE — Patient Outreach (Signed)
Received a pharmacy assistance referral for Jessica [name].  I have posted it in the [employer] portal. Ticket saved successfully with Tracking ID: [id number]

## 2019-10-10 NOTE — Patient Outreach (Signed)
  [facility]) Care Management Chronic Special Needs Program    10/10/2019  Name: Jessica [name], DOB: [birth year]  MRN: [id number]   Ms. Bethan Adamek is enrolled in a chronic special needs plan for Diabetes. RNCM received notification that client called to request medication assistance. RNCM returned call.  Per client - Medication assistance needs:  Jardiance 10 mg bid- she was told the cost would be $389. She has 2 pills on hand.  Entresto 97-103 bid- she has 2-3 pills on hand. Regarding Entresto- Ms. Phegley was informed that some paperwork was sent to her and Dr. Melvin(cardiologist) to complete, but Ms. Sherrow states she has never received any paperwork.   Elliquis 5mg  bid- she recently paid $290 for a refill.  RNCM asked client to call her providers to ask if they have any samples of the [place].  Plan: Referral sent to [facility] pharmacist. RNCM will continue to follow.  Thea Silversmith, RN, MSN, [place] [phone]

## 2019-10-10 NOTE — Telephone Encounter (Signed)
Spoke with pharmacy and patient just needed refill of Entresto [facility]  Patient aware and will call back if needs anything else

## 2019-10-10 NOTE — Patient Outreach (Signed)
  [facility]) Care Management Chronic Special Needs Program    10/10/2019  Name: Jessica [name], DOB: 1933-11-29  MRN: [id number]   [name] is enrolled in a chronic special needs plan for Heart Failure.  Care Coordination: RNCM received message from cardiologist office that client had just called regarding medication needs. RNCM reiterated that [facility] sent referral to [facility] [employer] Pharmacist and that client will need samples if available.   Plan: RNCM will continue to follow.  Thea Silversmith, RN, MSN, [place] [phone]

## 2019-10-10 NOTE — Telephone Encounter (Signed)
Earvin Hansen, LPN at 07/02/3641 8:37 PM  Status: Signed  Spoke with pharmacy and patient just needed refill of Entresto [facility]  Patient aware and will call back if needs anything else

## 2019-10-10 NOTE — Telephone Encounter (Signed)
Pt states Jardiance is too costly. Please send alternative [facility] [address]

## 2019-10-10 NOTE — Telephone Encounter (Signed)
    Pt c/o medication issue:  1. Name of Medication: empagliflozin (JARDIANCE) 10 MG TABS tablet  2. How are you currently taking this medication (dosage and times per day)? As written  3. Are you having a reaction (difficulty breathing--STAT)? no  4. What is your medication issue? Patient reports medication too costly ($380 for 90 day supply)

## 2019-10-10 NOTE — Telephone Encounter (Signed)
No samples are available. Called both phone numbers - both mailboxes are full.  The company does not sample this dose.  if  Patient needs to fill out  [employer]  Patient assistance for Delene Loll 97/103- Patient may request one - can place at front desk or it be mailed to her.

## 2019-10-10 NOTE — Telephone Encounter (Signed)
Pt spoke with Thea Silversmith, RN and she sent in a request to [facility] pharmacy to follow up on them. Pt wanted to know if we have any samples of Entresto because she only has 2-3 pills left. Please call to discuss

## 2019-10-10 NOTE — Telephone Encounter (Signed)
Pt c/o medication issue:  1. Name of Medication: sacubitril-valsartan (ENTRESTO) 97-103 MG  2. How are you currently taking this medication (dosage and times per day)? 1 tablet by mouth two times daily   3. Are you having a reaction (difficulty breathing--STAT)? No   4. What is your medication issue? Jessica [name] states when she went to pick up her refill the pharmacy stated there is paperwork that needs to be filled out by the office before they can release the medication to her. Jessica [name] states she only has two tablets left so she only has enough medication to last her through tomorrow. Please advise.

## 2019-10-13 ENCOUNTER — Other Ambulatory Visit: Payer: Self-pay

## 2019-10-13 NOTE — Telephone Encounter (Signed)
Called pt, left detailed message. Will try again later

## 2019-10-13 NOTE — Telephone Encounter (Signed)
Is there a preferred formulary alternative to Jardiance that is less expensive?  Please asked the patient to find out. Thanks

## 2019-10-13 NOTE — Patient Outreach (Signed)
  [facility]) Care Management Chronic Special Needs Program    10/13/2019  Name: Jessica [name], DOB: 12-10-1933  MRN: [id number]   Jessica [name] is enrolled in a chronic special needs plan. RNCM received call from [facility] [employer] pharmacist, Isabell Jarvis, who called to clarify client medication assistance needs. Mr. Genia Hotter states someone will reach out to client today.  Plan: RNCM will continue to follow.  Thea Silversmith, RN, MSN, [place] [phone]

## 2019-10-14 NOTE — Telephone Encounter (Signed)
Pt would like to switch to [place] instead of Jardiance

## 2019-10-14 NOTE — Telephone Encounter (Signed)
New message:   Pt is calling and requesting that a prescription for Synjardy be sent to [facility] order pharmacy instead of the [facility]. She states the [name] is too expensive for her and she is unable to pay for this medication. Please advise.

## 2019-10-14 NOTE — Telephone Encounter (Signed)
Called pt and informed her to see what alternatives her insurance will cover. Pt states she does not know if she will do it.

## 2019-10-16 ENCOUNTER — Other Ambulatory Visit: Payer: Self-pay | Admitting: Internal Medicine

## 2019-10-16 MED ORDER — SYNJARDY 5-500 MG PO TABS: 1.0000 | ORAL_TABLET | Freq: Two times a day (BID) | ORAL | 3 refills | Status: DC

## 2019-10-16 NOTE — Telephone Encounter (Signed)
Done. Thx.

## 2019-10-24 ENCOUNTER — Other Ambulatory Visit: Payer: Self-pay

## 2019-10-24 NOTE — Telephone Encounter (Signed)
1.Medication Requested:allopurinol (ZYLOPRIM) 100 MG tablet  2. Pharmacy (Name, Street, City):[id number] [place] [place]  3. On Med List: Yes   4. Last Visit with PCP: 6.8.21   5. Next visit date with PCP: 9.8.21    Agent: Please be advised that RX refills may take up to 3 business days. We ask that you follow-up with your pharmacy.

## 2019-10-27 NOTE — Telephone Encounter (Signed)
Called pt and informed her that her medication was sent 07/16/2019 for 30 days and 11 refills.

## 2019-10-27 NOTE — Telephone Encounter (Signed)
F/u   The patient is calling in reference to her Rx asking for a call back today

## 2019-10-28 ENCOUNTER — Other Ambulatory Visit: Payer: Self-pay

## 2019-10-28 MED ORDER — EMPAGLIFLOZIN 10 MG PO TABS: 10.0000 mg | ORAL_TABLET | Freq: Every day | ORAL | 3 refills | Status: DC

## 2019-11-20 ENCOUNTER — Ambulatory Visit (INDEPENDENT_AMBULATORY_CARE_PROVIDER_SITE_OTHER): Payer: HMO | Admitting: *Deleted

## 2019-11-20 DIAGNOSIS — I442 Atrioventricular block, complete: Secondary | ICD-10-CM | POA: Diagnosis not present

## 2019-11-20 LAB — CUP PACEART REMOTE DEVICE CHECK
Battery Impedance: 652 Ohm
Battery Remaining Longevity: 78 mo
Battery Voltage: 2.79 V
Brady Statistic AP VP Percent: 9 %
Brady Statistic AP VS Percent: 0 %
Brady Statistic AS VP Percent: 91 %
Brady Statistic AS VS Percent: 0 %
Date Time Interrogation Session: 20210819111922
Implantable Lead Implant Date: 20140710
Implantable Lead Implant Date: 20140710
Implantable Lead Location: 753859
Implantable Lead Location: 753860
Implantable Lead Model: 5092
Implantable Lead Model: 5592
Implantable Pulse Generator Implant Date: 20140710
Lead Channel Impedance Value: 596 Ohm
Lead Channel Impedance Value: 765 Ohm
Lead Channel Pacing Threshold Amplitude: 0.375 V
Lead Channel Pacing Threshold Amplitude: 0.5 V
Lead Channel Pacing Threshold Pulse Width: 0.4 ms
Lead Channel Pacing Threshold Pulse Width: 0.4 ms
Lead Channel Setting Pacing Amplitude: 2 V
Lead Channel Setting Pacing Amplitude: 2.5 V
Lead Channel Setting Pacing Pulse Width: 0.4 ms
Lead Channel Setting Sensing Sensitivity: 4 mV

## 2019-11-23 ENCOUNTER — Other Ambulatory Visit: Payer: Self-pay | Admitting: Internal Medicine

## 2019-11-24 NOTE — Progress Notes (Signed)
Remote pacemaker transmission.   

## 2019-11-26 ENCOUNTER — Ambulatory Visit: Payer: HMO | Admitting: Cardiovascular Disease

## 2019-11-26 ENCOUNTER — Encounter: Payer: Self-pay | Admitting: Cardiovascular Disease

## 2019-11-26 ENCOUNTER — Other Ambulatory Visit: Payer: Self-pay

## 2019-11-26 VITALS — BP 124/76 | HR 92 | Ht 63.0 in | Wt 246.8 lb

## 2019-11-26 DIAGNOSIS — I5032 Chronic diastolic (congestive) heart failure: Secondary | ICD-10-CM | POA: Diagnosis not present

## 2019-11-26 DIAGNOSIS — I10 Essential (primary) hypertension: Secondary | ICD-10-CM

## 2019-11-26 DIAGNOSIS — I872 Venous insufficiency (chronic) (peripheral): Secondary | ICD-10-CM | POA: Diagnosis not present

## 2019-11-26 DIAGNOSIS — E7800 Pure hypercholesterolemia, unspecified: Secondary | ICD-10-CM

## 2019-11-26 DIAGNOSIS — I4892 Unspecified atrial flutter: Secondary | ICD-10-CM | POA: Diagnosis not present

## 2019-11-26 DIAGNOSIS — I4891 Unspecified atrial fibrillation: Secondary | ICD-10-CM

## 2019-11-26 DIAGNOSIS — Z5181 Encounter for therapeutic drug level monitoring: Secondary | ICD-10-CM

## 2019-11-26 NOTE — Patient Instructions (Signed)
Medication Instructions:  Your physician recommends that you continue on your current medications as directed. Please refer to the Current Medication list given to you today.  *If you need a refill on your cardiac medications before your next appointment, please call your pharmacy*  Lab Work: LP/CMET TODAY   If you have labs (blood work) drawn today and your tests are completely normal, you will receive your results only by: . MyChart Message (if you have MyChart) OR . A paper copy in the mail If you have any lab test that is abnormal or we need to change your treatment, we will call you to review the results.  Testing/Procedures: NONE   Follow-Up: At [facility], you and your health needs are our priority.  As part of our continuing mission to provide you with exceptional heart care, we have created designated Provider Care Teams.  These Care Teams include your primary Cardiologist (physician) and Advanced Practice Providers (APPs -  Physician Assistants and Nurse Practitioners) who all work together to provide you with the care you need, when you need it.  We recommend signing up for the patient portal called "MyChart".  Sign up information is provided on this After Visit Summary.  MyChart is used to connect with patients for Virtual Visits (Telemedicine).  Patients are able to view lab/test results, encounter notes, upcoming appointments, etc.  Non-urgent messages can be sent to your provider as well.   To learn more about what you can do with MyChart, go to [email or web address]://[email or web address].com.    Your next appointment:   6 month(s)  The format for your next appointment:   In Person  Provider:   You may see Tiffany [place], MD or one of the following Advanced Practice Providers on your designated Care Team:    Luke Kilroy, PA-C  Callie Goodrich, PA-C  Jesse Cleaver, FNP      
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
## 2019-12-02 ENCOUNTER — Telehealth: Payer: Self-pay | Admitting: Internal Medicine

## 2019-12-02 MED ORDER — VENLAFAXINE HCL ER 75 MG PO CP24: ORAL_CAPSULE | ORAL | 3 refills | Status: DC

## 2019-12-02 NOTE — Telephone Encounter (Signed)
New Message:   1.Medication Requested: venlafaxine XR (EFFEXOR-XR) 75 MG 24 hr capsule Empagliflozin-metFORMIN HCl (SYNJARDY) 5-500 MG TABS 2. Pharmacy (Name, [place], [place]): [facility] - [place], [address] 3. On Med List: yes  4. Last Visit with PCP: 09/09/19  5. Next visit date with PCP: 12/10/19   Agent: Please be advised that RX refills may take up to 3 business days. We ask that you follow-up with your pharmacy.

## 2019-12-02 NOTE — Addendum Note (Signed)
Addended by: Hinton Dyer on: 12/02/2019 10:28 AM   Modules accepted: Orders

## 2019-12-02 NOTE — Telephone Encounter (Signed)
Sent in prescription for venlafaxaine and informed her the metfomin was filled 10/16/19 for ayears supply and to follow up with her pharmacy

## 2019-12-03 ENCOUNTER — Other Ambulatory Visit: Payer: Self-pay | Admitting: Physician Assistant

## 2019-12-03 DIAGNOSIS — Z952 Presence of prosthetic heart valve: Secondary | ICD-10-CM

## 2019-12-10 ENCOUNTER — Other Ambulatory Visit: Payer: Self-pay

## 2019-12-10 ENCOUNTER — Encounter: Payer: Self-pay | Admitting: Internal Medicine

## 2019-12-10 ENCOUNTER — Ambulatory Visit (INDEPENDENT_AMBULATORY_CARE_PROVIDER_SITE_OTHER): Payer: Medicare Other | Admitting: Internal Medicine

## 2019-12-10 VITALS — BP 140/60 | HR 103 | Temp 98.3°F | Ht 63.0 in | Wt 242.0 lb

## 2019-12-10 DIAGNOSIS — I4891 Unspecified atrial fibrillation: Secondary | ICD-10-CM | POA: Diagnosis not present

## 2019-12-10 DIAGNOSIS — F419 Anxiety disorder, unspecified: Secondary | ICD-10-CM

## 2019-12-10 DIAGNOSIS — E118 Type 2 diabetes mellitus with unspecified complications: Secondary | ICD-10-CM

## 2019-12-10 DIAGNOSIS — I5043 Acute on chronic combined systolic (congestive) and diastolic (congestive) heart failure: Secondary | ICD-10-CM | POA: Diagnosis not present

## 2019-12-10 DIAGNOSIS — I4892 Unspecified atrial flutter: Secondary | ICD-10-CM

## 2019-12-10 MED ORDER — METFORMIN HCL 500 MG PO TABS: 500.0000 mg | ORAL_TABLET | Freq: Two times a day (BID) | ORAL | 3 refills | Status: DC

## 2019-12-10 MED ORDER — REPAGLINIDE 2 MG PO TABS: 2.0000 mg | ORAL_TABLET | Freq: Three times a day (TID) | ORAL | 3 refills | Status: DC

## 2019-12-10 NOTE — Patient Outreach (Signed)
  [facility]) Care Management Chronic Special Needs Program    12/10/2019  Name: Jessica [name], DOB: July 30, 1933  MRN: [id number]   Received notification that client has dis enrolled from the [employer] C-SNP plan. RNCM will no longer follow as client's C-SNP chronic care management coordinator.   PLAN:  RNCM will send case closure letter to client's primary care provider.   Thea Silversmith, RN, MSN, [place] [phone]

## 2019-12-10 NOTE — Assessment & Plan Note (Signed)
Pt can't afford Jardiance - she stopped taking Rx: >$350 per 30 tabs On Metformin Added Prandin

## 2019-12-10 NOTE — Assessment & Plan Note (Signed)
Toprol, Eliquis 

## 2019-12-10 NOTE — Assessment & Plan Note (Signed)
Better  Wt Readings from Last 3 Encounters:  12/10/19 242 lb (109.8 kg)  11/26/19 246 lb 12.8 oz (111.9 kg)  09/09/19 254 lb 2 oz (115.3 kg)

## 2019-12-10 NOTE — Assessment & Plan Note (Signed)
Stable

## 2019-12-10 NOTE — Progress Notes (Signed)
Subjective:  Patient ID: Jessica [name], female    DOB: 06-16-1933  Age: 86 y.o. MRN: [id number]  CC: No chief complaint on file.   HPI Jessica [name] presents for CHF, CAD, gout, DM Pt can't afford Jardiance - she stopped taking Rx: >$350 per 30 tabs On Metformin Pt lost wt   Outpatient Medications Prior to Visit  Medication Sig Dispense Refill  . acetaminophen (TYLENOL) 325 MG tablet Take 2 tablets (650 mg total) by mouth every 6 (six) hours as needed for mild pain (or Fever >/= 101).    [name] allopurinol (ZYLOPRIM) 100 MG tablet TAKE 1 TABLET BY MOUTH EVERY DAY 30 tablet 11  . apixaban (ELIQUIS) 5 MG TABS tablet Take 1 tablet (5 mg total) by mouth 2 (two) times daily. 180 tablet 3  . b complex vitamins tablet Take 1 tablet by mouth daily.    . Blood Glucose Calibration (OT ULTRA/FASTTK CNTRL SOLN) SOLN Use as directed 1 each 0  . Blood Glucose Monitoring Suppl (ONE TOUCH ULTRA 2) w/Device KIT Use as instructed to check blood sugar BID 1 kit 0  . Cholecalciferol 1000 UNITS tablet Take 1,000 Units by mouth daily.      . Empagliflozin-metFORMIN HCl (SYNJARDY) 5-500 MG TABS Take 1 tablet by mouth 2 (two) times daily. 180 tablet 3  . furosemide (LASIX) 40 MG tablet TAKE 1 TABLET TWICE A DAY 180 tablet 3  . glucose blood (ONETOUCH ULTRA) test strip Use as instructed to check blood sugar BID 200 each 3  . KLOR-CON M20 20 MEQ tablet TAKE 1 TABLET BY MOUTH EVERY DAY 90 tablet 3  . Lancets (ONETOUCH ULTRASOFT) lancets Use as instructed to check blood sugar BID 200 each 3  . levothyroxine (SYNTHROID) 75 MCG tablet Take 1 tablet (75 mcg total) by mouth daily. 90 tablet 3  . LORazepam (ATIVAN) 0.5 MG tablet TAKE 1 TABLET BY MOUTH 2 TIMES DAILY. 180 tablet 1  . meclizine (ANTIVERT) 12.5 MG tablet Take 1-2 tablets (12.5-25 mg total) by mouth 3 (three) times daily as needed for dizziness or nausea. 60 tablet 1  . metoprolol succinate (TOPROL-XL) 50 MG 24 hr tablet TAKE 1 TABLET (50 MG TOTAL)  BY MOUTH DAILY. TAKE WITH OR IMMEDIATELY FOLLOWING A MEAL. 90 tablet 3  . Multiple Vitamin (MULTIVITAMIN WITH MINERALS) TABS tablet Take 1 tablet by mouth daily. Women's Centrum    . Multiple Vitamins-Minerals (PRESERVISION AREDS 2 PO) Take 1 tablet by mouth 2 (two) times daily.    Vladimir Faster Glycol-Propyl Glycol (LUBRICANT EYE DROPS) 0.4-0.3 % SOLN Place 1 drop into both eyes 4 (four) times daily as needed (dry/irritated eyes.).    [name] sacubitril-valsartan (ENTRESTO) 97-103 MG Take 1 tablet by mouth 2 (two) times daily. 60 tablet 11  . temazepam (RESTORIL) 15 MG capsule TAKE 1 CAPSULE BY MOUTH AT BEDTIME AS NEEDED FOR SLEEP 30 capsule 5  . triamcinolone ointment (KENALOG) 0.5 % Apply 1 application topically 3 (three) times daily. 60 g 1  . venlafaxine XR (EFFEXOR-XR) 75 MG 24 hr capsule TAKE 1 CAPSULE EVERY DAY WITH BREAKFAST 90 capsule 3  . empagliflozin (JARDIANCE) 10 MG TABS tablet Take 1 tablet (10 mg total) by mouth daily before breakfast. (Patient not taking: Reported on 12/10/2019) 90 tablet 3   No facility-administered medications prior to visit.    ROS: Review of Systems  Constitutional: Positive for fatigue. Negative for activity change, appetite change, chills and unexpected weight change.  HENT: Negative for congestion, mouth sores  and sinus pressure.   Eyes: Negative for visual disturbance.  Respiratory: Positive for shortness of breath. Negative for cough and chest tightness.   Gastrointestinal: Negative for abdominal pain and nausea.  Genitourinary: Negative for difficulty urinating, frequency and vaginal pain.  Musculoskeletal: Negative for back pain and gait problem.  Skin: Negative for pallor and rash.  Neurological: Negative for dizziness, tremors, weakness, numbness and headaches.  Psychiatric/Behavioral: Negative for confusion and sleep disturbance. The patient is nervous/anxious.     Objective:  BP 140/60 (BP Location: Left Arm, Patient Position: Sitting, Cuff Size:  Large)   Pulse (!) 103   Temp 98.3 F (36.8 C) (Oral)   Ht '5\' 3"'  (1.6 m)   Wt 242 lb (109.8 kg)   SpO2 97%   BMI 42.87 kg/m   BP Readings from Last 3 Encounters:  12/10/19 140/60  11/26/19 124/76  09/09/19 140/90    Wt Readings from Last 3 Encounters:  12/10/19 242 lb (109.8 kg)  11/26/19 246 lb 12.8 oz (111.9 kg)  09/09/19 254 lb 2 oz (115.3 kg)    Physical Exam Constitutional:      General: She is not in acute distress.    Appearance: She is well-developed. She is obese.  HENT:     Head: Normocephalic.     Right Ear: External ear normal.     Left Ear: External ear normal.     Nose: Nose normal.  Eyes:     General:        Right eye: No discharge.        Left eye: No discharge.     Conjunctiva/sclera: Conjunctivae normal.     Pupils: Pupils are equal, round, and reactive to light.  Neck:     Thyroid: No thyromegaly.     Vascular: No JVD.     Trachea: No tracheal deviation.  Cardiovascular:     Rate and Rhythm: Normal rate and regular rhythm.     Heart sounds: Normal heart sounds.  Pulmonary:     Effort: No respiratory distress.     Breath sounds: No stridor. No wheezing.  Abdominal:     General: Bowel sounds are normal. There is no distension.     Palpations: Abdomen is soft. There is no mass.     Tenderness: There is no abdominal tenderness. There is no guarding or rebound.  Musculoskeletal:        General: Tenderness present.     Cervical back: Normal range of motion and neck supple.  Lymphadenopathy:     Cervical: No cervical adenopathy.  Skin:    Findings: Lesion present. No erythema or rash.  Neurological:     Mental Status: She is oriented to person, place, and time.     Cranial Nerves: No cranial nerve deficit.     Motor: No abnormal muscle tone.     Coordination: Coordination normal.     Gait: Gait abnormal.     Deep Tendon Reflexes: Reflexes normal.  Psychiatric:        Behavior: Behavior normal.        Thought Content: Thought content  normal.        Judgment: Judgment normal.   cane Cat scratches on arms  Lab Results  Component Value Date   WBC 6.7 01/15/2019   HGB 13.4 01/15/2019   HCT 40.3 01/15/2019   PLT 214 01/15/2019   GLUCOSE 336 (H) 09/09/2019   CHOL 191 02/19/2017   TRIG 183.0 (H) 02/19/2017   HDL 38.90 (L) 02/19/2017  LDLDIRECT 155.9 09/30/2012   LDLCALC 116 (H) 02/19/2017   ALT 22 01/10/2019   AST 25 01/10/2019   NA 133 (L) 09/09/2019   K 4.1 09/09/2019   CL 99 09/09/2019   CREATININE 1.03 09/09/2019   BUN 25 (H) 09/09/2019   CO2 25 09/09/2019   TSH 2.771 06/22/2017   INR 1.2 01/10/2019   HGBA1C 9.6 (H) 09/09/2019   MICROALBUR 4.5 (H) 12/02/2009    CT Chest Wo Contrast  Result Date: 07/09/2019 CLINICAL DATA:  Follow-up of inferior lingular nodule. EXAM: CT CHEST WITHOUT CONTRAST TECHNIQUE: Multidetector CT imaging of the chest was performed following the standard protocol without IV contrast. COMPARISON:  CTA of the chest of 01/07/2019. FINDINGS: Cardiovascular: Aortic atherosclerosis. Aortic valve repair. Tortuous thoracic aorta. Mild cardiomegaly, without pericardial effusion. Pacer. Lad coronary artery calcification. Pulmonary artery enlargement, outflow tract 3.1 cm Mediastinum/Nodes: No mediastinal or definite hilar adenopathy, given limitations of unenhanced CT. Moderate hiatal hernia. Lungs/Pleura: No pleural fluid. Lingular scarring. Lingular nodule of 7 mm on 72/3 is similar on the prior exam and back on 10/07/2008, considered benign. Posterior left upper lobe calcified granulomas. Upper Abdomen: Hepatic steatosis. Normal imaged portions of the spleen, pancreas, adrenal glands, left kidney. Musculoskeletal: Advanced lower thoracic spondylosis. Eccentric right T12 vertebral hemangioma. IMPRESSION: 1. Similar lingular nodule, including back to 10/07/2008. This can be considered benign. 2. Pulmonary artery enlargement suggests pulmonary arterial hypertension. 3. Moderate hiatal hernia. 4. Hepatic  steatosis. 5. Coronary artery atherosclerosis. Aortic Atherosclerosis (ICD10-I70.0). Electronically Signed   By: Abigail Miyamoto M.D.   On: 07/09/2019 12:40    Assessment & Plan:   Walker Kehr, MD

## 2019-12-11 LAB — COMPLETE METABOLIC PANEL WITH GFR
AG Ratio: 1.1 (calc) (ref 1.0–2.5)
ALT: 30 U/L — ABNORMAL HIGH (ref 6–29)
AST: 30 U/L (ref 10–35)
Albumin: 4.2 g/dL (ref 3.6–5.1)
Alkaline phosphatase (APISO): 90 U/L (ref 37–153)
BUN/Creatinine Ratio: 17 (calc) (ref 6–22)
BUN: 17 mg/dL (ref 7–25)
CO2: 26 mmol/L (ref 20–32)
Calcium: 9.3 mg/dL (ref 8.6–10.4)
Chloride: 98 mmol/L (ref 98–110)
Creat: 1 mg/dL — ABNORMAL HIGH (ref 0.60–0.88)
GFR, Est African American: 60 mL/min/{1.73_m2} (ref >=60)
GFR, Est Non African American: 52 mL/min/{1.73_m2} — ABNORMAL LOW (ref >=60)
Globulin: 3.8 g/dL (calc) — ABNORMAL HIGH (ref 1.9–3.7)
Glucose, Bld: 281 mg/dL — ABNORMAL HIGH (ref 65–99)
Potassium: 4.3 mmol/L (ref 3.5–5.3)
Sodium: 137 mmol/L (ref 135–146)
Total Bilirubin: 0.8 mg/dL (ref 0.2–1.2)
Total Protein: 8 g/dL (ref 6.1–8.1)

## 2019-12-11 LAB — CBC WITH DIFFERENTIAL/PLATELET
Absolute Monocytes: 556 cells/uL (ref 200–950)
Basophils Absolute: 27 cells/uL (ref 0–200)
Basophils Relative: 0.4 %
Eosinophils Absolute: 107 cells/uL (ref 15–500)
Eosinophils Relative: 1.6 %
HCT: 42.8 % (ref 35.0–45.0)
Hemoglobin: 14.4 g/dL (ref 11.7–15.5)
Lymphs Abs: 2546 cells/uL (ref 850–3900)
MCH: 30.8 pg (ref 27.0–33.0)
MCHC: 33.6 g/dL (ref 32.0–36.0)
MCV: 91.5 fL (ref 80.0–100.0)
MPV: 10.3 fL (ref 7.5–12.5)
Monocytes Relative: 8.3 %
Neutro Abs: 3464 cells/uL (ref 1500–7800)
Neutrophils Relative %: 51.7 %
Platelets: 259 10*3/uL (ref 140–400)
RBC: 4.68 10*6/uL (ref 3.80–5.10)
RDW: 11.8 % (ref 11.0–15.0)
Total Lymphocyte: 38 %
WBC: 6.7 10*3/uL (ref 3.8–10.8)

## 2019-12-11 LAB — HEMOGLOBIN A1C
Hgb A1c MFr Bld: 12.2 % of total Hgb — ABNORMAL HIGH (ref <5.7)
Mean Plasma Glucose: 303 (calc)
eAG (mmol/L): 16.8 (calc)

## 2019-12-12 ENCOUNTER — Other Ambulatory Visit: Payer: Self-pay | Admitting: Internal Medicine

## 2019-12-15 NOTE — Telephone Encounter (Signed)
[place] Controlled Database Checked Last filled: 11/11/19 (30) LOV w/you: 12/10/2019 Next appt w/you: 03/10/2020

## 2019-12-16 DIAGNOSIS — Z5181 Encounter for therapeutic drug level monitoring: Secondary | ICD-10-CM | POA: Diagnosis not present

## 2019-12-16 DIAGNOSIS — I10 Essential (primary) hypertension: Secondary | ICD-10-CM | POA: Diagnosis not present

## 2019-12-16 DIAGNOSIS — E7800 Pure hypercholesterolemia, unspecified: Secondary | ICD-10-CM | POA: Diagnosis not present

## 2019-12-16 LAB — LIPID PANEL
Chol/HDL Ratio: 6.3 ratio — ABNORMAL HIGH (ref 0.0–4.4)
Cholesterol, Total: 226 mg/dL — ABNORMAL HIGH (ref 100–199)
HDL: 36 mg/dL — ABNORMAL LOW (ref >39)
LDL Chol Calc (NIH): 148 mg/dL — ABNORMAL HIGH (ref 0–99)
Triglycerides: 230 mg/dL — ABNORMAL HIGH (ref 0–149)
VLDL Cholesterol Cal: 42 mg/dL — ABNORMAL HIGH (ref 5–40)

## 2019-12-16 LAB — COMPREHENSIVE METABOLIC PANEL
ALT: 23 IU/L (ref 0–32)
AST: 24 IU/L (ref 0–40)
Albumin/Globulin Ratio: 1.1 — ABNORMAL LOW (ref 1.2–2.2)
Albumin: 4.1 g/dL (ref 3.6–4.6)
Alkaline Phosphatase: 104 IU/L (ref 44–121)
BUN/Creatinine Ratio: 14 (ref 12–28)
BUN: 13 mg/dL (ref 8–27)
Bilirubin Total: 0.6 mg/dL (ref 0.0–1.2)
CO2: 23 mmol/L (ref 20–29)
Calcium: 9.6 mg/dL (ref 8.7–10.3)
Chloride: 99 mmol/L (ref 96–106)
Creatinine, Ser: 0.96 mg/dL (ref 0.57–1.00)
GFR calc Af Amer: 63 mL/min/{1.73_m2} (ref >59)
GFR calc non Af Amer: 54 mL/min/{1.73_m2} — ABNORMAL LOW (ref >59)
Globulin, Total: 3.6 g/dL (ref 1.5–4.5)
Glucose: 175 mg/dL — ABNORMAL HIGH (ref 65–99)
Potassium: 4.3 mmol/L (ref 3.5–5.2)
Sodium: 138 mmol/L (ref 134–144)
Total Protein: 7.7 g/dL (ref 6.0–8.5)

## 2019-12-24 ENCOUNTER — Ambulatory Visit: Payer: Medicare Other | Admitting: Physician Assistant

## 2019-12-24 ENCOUNTER — Other Ambulatory Visit (HOSPITAL_COMMUNITY): Payer: HMO

## 2020-01-06 ENCOUNTER — Other Ambulatory Visit (HOSPITAL_COMMUNITY): Payer: Medicare Other
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
## 2020-01-07 ENCOUNTER — Encounter: Payer: Self-pay | Admitting: Physician Assistant

## 2020-01-07 ENCOUNTER — Ambulatory Visit: Payer: Medicare Other | Admitting: Physician Assistant

## 2020-01-07 ENCOUNTER — Ambulatory Visit: Payer: HMO

## 2020-01-07 ENCOUNTER — Ambulatory Visit (HOSPITAL_COMMUNITY): Payer: Medicare Other | Attending: Cardiovascular Disease

## 2020-01-07 ENCOUNTER — Other Ambulatory Visit: Payer: Self-pay

## 2020-01-07 VITALS — BP 140/72 | HR 78 | Ht 63.0 in | Wt 247.2 lb

## 2020-01-07 DIAGNOSIS — Z86711 Personal history of pulmonary embolism: Secondary | ICD-10-CM

## 2020-01-07 DIAGNOSIS — I10 Essential (primary) hypertension: Secondary | ICD-10-CM | POA: Diagnosis not present

## 2020-01-07 DIAGNOSIS — I5042 Chronic combined systolic (congestive) and diastolic (congestive) heart failure: Secondary | ICD-10-CM

## 2020-01-07 DIAGNOSIS — Z952 Presence of prosthetic heart valve: Secondary | ICD-10-CM | POA: Diagnosis not present

## 2020-01-07 DIAGNOSIS — Z950 Presence of cardiac pacemaker: Secondary | ICD-10-CM

## 2020-01-07 DIAGNOSIS — R911 Solitary pulmonary nodule: Secondary | ICD-10-CM

## 2020-01-07 DIAGNOSIS — I480 Paroxysmal atrial fibrillation: Secondary | ICD-10-CM

## 2020-01-07 LAB — ECHOCARDIOGRAM COMPLETE
AR max vel: 1.75 cm2
AV Area VTI: 2.03 cm2
AV Area mean vel: 1.68 cm2
AV Mean grad: 9.3 mmHg
AV Peak grad: 17.4 mmHg
Ao pk vel: 2.09 m/s
S' Lateral: 1.6 cm

## 2020-01-07 NOTE — Patient Instructions (Signed)
Call us if you need anything

## 2020-01-23 ENCOUNTER — Ambulatory Visit: Payer: Medicare Other

## 2020-01-27 ENCOUNTER — Other Ambulatory Visit: Payer: Self-pay

## 2020-01-27 ENCOUNTER — Ambulatory Visit (INDEPENDENT_AMBULATORY_CARE_PROVIDER_SITE_OTHER): Payer: Medicare Other | Admitting: Pharmacist Clinician (PhC)/ Clinical Pharmacy Specialist

## 2020-01-27 DIAGNOSIS — E7800 Pure hypercholesterolemia, unspecified: Secondary | ICD-10-CM

## 2020-01-27 NOTE — Patient Instructions (Addendum)
Your Results:             Your most recent labs Goal  Total Cholesterol 226 < 200  Triglycerides 230 < 150  HDL (happy/good cholesterol) 36 > 40  LDL (lousy/bad cholesterol 148 < 100   Medication changes:  Start ezetimibe 10 mg once daily  Start rosuvastatin 10 mg every Monday and Friday.  If you do well with this, increase to 1 tablet each Monday, Wednesday and Friday, starting the week after Thanksgiving.    Lab orders:  We will mail a lab order to you at the beginning of January to repeat cholesterol labs.  If you are unable to tolerate the rosuvastatin, please call us at [phone] (Caelan Branden/Raquel)   Thank you for choosing [facility]    High Triglycerides Eating Plan Triglycerides are a type of fat in the blood. High levels of triglycerides can increase your risk of heart disease and stroke. If your triglyceride levels are high, choosing the right foods can help lower your triglycerides and keep your heart healthy. Work with your health care provider or a diet and nutrition specialist (dietitian) to develop an eating plan that is right for you. What are tips for following this plan? General guidelines   Lose weight, if you are overweight. For most people, losing 5-10 lbs (2-5 kg) helps lower triglyceride levels. A weight-loss plan may include. ? 30 minutes of exercise at least 5 days a week. ? Reducing the amount of calories, sugar, and fat you eat.  Eat a wide variety of fresh fruits, vegetables, and whole grains. These foods are high in fiber.  Eat foods that contain healthy fats, such as fatty fish, nuts, seeds, and olive oil.  Avoid foods that are high in added sugar, added salt (sodium), saturated fat, and trans fat.  Avoid low-fiber, refined carbohydrates such as white bread, crackers, noodles, and white rice.  Avoid foods with partially hydrogenated oils (trans fats), such as fried foods or stick margarine.  Limit alcohol intake to no more than 1 drink a day  for nonpregnant women and 2 drinks a day for men. One drink equals 12 oz of beer, 5 oz of wine, or 1 oz of hard liquor. Your health care provider may recommend that you drink less depending on your overall health. Reading food labels  Check food labels for the amount of saturated fat. Choose foods with no or very little saturated fat.  Check food labels for the amount of trans fat. Choose foods with no trans fat.  Check food labels for the amount of cholesterol. Choose foods low in cholesterol. Ask your dietitian how much cholesterol you should have each day.  Check food labels for the amount of sodium. Choose foods with less than 140 milligrams (mg) per serving. Shopping  Buy dairy products labeled as nonfat (skim) or low-fat (1%).  Avoid buying processed or prepackaged foods. These are often high in added sugar, sodium, and fat. Cooking  Choose healthy fats when cooking, such as olive oil or canola oil.  Cook foods using lower fat methods, such as baking, broiling, boiling, or grilling.  Make your own sauces, dressings, and marinades when possible, instead of buying them. Store-bought sauces, dressings, and marinades are often high in sodium and sugar. Meal planning  Eat more home-cooked food and less restaurant, buffet, and fast food.  Eat fatty fish at least 2 times each week. Examples of fatty fish include salmon, trout, mackerel, tuna, and herring.  If you eat whole  eggs, do not eat more than 3 egg yolks per week. What foods are recommended? The items listed may not be a complete list. Talk with your dietitian about what dietary choices are best for you. Grains Whole wheat or whole grain breads, crackers, cereals, and pasta. Unsweetened oatmeal. Bulgur. Barley. Quinoa. Brown rice. Whole wheat flour tortillas. Vegetables Fresh or frozen vegetables. Low-sodium canned vegetables. Fruits All fresh, canned (in natural juice), or frozen fruits. Meats and other protein  foods Skinless chicken or [place]. Ground chicken or [place]. Lean cuts of pork, trimmed of fat. Fish and seafood, especially salmon, trout, and herring. Egg whites. Dried beans, peas, or lentils. Unsalted nuts or seeds. Unsalted canned beans. Natural peanut or almond butter. Dairy Low-fat dairy products. Skim or low-fat (1%) milk. Reduced fat (2%) and low-sodium cheese. Low-fat ricotta cheese. Low-fat cottage cheese. Plain, low-fat yogurt. Fats and oils Tub margarine without trans fats. Light or reduced-fat mayonnaise. Light or reduced-fat salad dressings. Avocado. Safflower, olive, sunflower, soybean, and canola oils. What foods are not recommended? The items listed may not be a complete list. Talk with your dietitian about what dietary choices are best for you. Grains White bread. White (regular) pasta. White rice. Cornbread. Bagels. Pastries. Crackers that contain trans fat. Vegetables Creamed or fried vegetables. Vegetables in a cheese sauce. Fruits Sweetened dried fruit. Canned fruit in syrup. Fruit juice. Meats and other protein foods Fatty cuts of meat. Ribs. Chicken wings. Berniece Salines. Sausage. Bologna. Salami. Chitterlings. Fatback. Hot dogs. Bratwurst. Packaged lunch meats. Dairy Whole or reduced-fat (2%) milk. Half-and-half. Cream cheese. Full-fat or sweetened yogurt. Full-fat cheese. Nondairy creamers. Whipped toppings. Processed cheese or cheese spreads. Cheese curds. Beverages Alcohol. Sweetened drinks, such as soda, lemonade, fruit drinks, or punches. Fats and oils Butter. Stick margarine. Lard. Shortening. Ghee. Bacon fat. Tropical oils, such as coconut, palm kernel, or palm oils. Sweets and desserts Corn syrup. Sugars. Honey. Molasses. Candy. Jam and jelly. Syrup. Sweetened cereals. Cookies. Pies. Cakes. Donuts. Muffins. Ice cream. Condiments Store-bought sauces, dressings, and marinades that are high in sugar, such as ketchup and barbecue sauce. Summary  High levels of  triglycerides can increase the risk of heart disease and stroke. Choosing the right foods can help lower your triglycerides.  Eat plenty of fresh fruits, vegetables, and whole grains. Choose low-fat dairy and lean meats. Eat fatty fish at least twice a week.  Avoid processed and prepackaged foods with added sugar, sodium, saturated fat, and trans fat.  If you need suggestions or have questions about what types of food are good for you, talk with your health care provider or a dietitian. This information is not intended to replace advice given to you by your health care provider. Make sure you discuss any questions you have with your health care provider. Document Revised: 03/02/2017 Document Reviewed: 05/23/2016 Elsevier Patient Education  2020 [employer].
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
## 2020-01-28 ENCOUNTER — Encounter: Payer: Self-pay | Admitting: Pharmacist Clinician (PhC)/ Clinical Pharmacy Specialist

## 2020-01-28 MED ORDER — EZETIMIBE 10 MG PO TABS: 10.0000 mg | ORAL_TABLET | Freq: Every day | ORAL | 3 refills | Status: DC

## 2020-01-28 MED ORDER — ROSUVASTATIN CALCIUM 10 MG PO TABS: ORAL_TABLET | ORAL | 3 refills | Status: DC

## 2020-01-28 NOTE — Assessment & Plan Note (Signed)
Patient with ASCVD, LDL cholesterol not at goal.  She has only failed one statin drug, atorvastatin, many years ago.  Reviewed options for lowering LDL cholesterol, including ezetimibe, PCSK-9 inhibitors and bempedoic acid.  Discussed mechanisms of action, dosing, side effects and potential decreases in LDL cholesterol.  Answered all patient questions.  Because she has only failed one statin in the past we will have her try rosuvastatin 10 mg twice weekly, along with ezetimibe 10 mg daily.  Should she tolerate the rosuvastatin, she can increase the dose to three times weekly after Thanksgiving.  We will repeat labs in January to determine her response and any further treatment needed.

## 2020-02-19 ENCOUNTER — Ambulatory Visit (INDEPENDENT_AMBULATORY_CARE_PROVIDER_SITE_OTHER): Payer: Medicare Other

## 2020-02-19 DIAGNOSIS — I442 Atrioventricular block, complete: Secondary | ICD-10-CM

## 2020-02-19 LAB — CUP PACEART REMOTE DEVICE CHECK
Battery Impedance: 728 Ohm
Battery Remaining Longevity: 74 mo
Battery Voltage: 2.79 V
Brady Statistic AP VP Percent: 10 %
Brady Statistic AP VS Percent: 0 %
Brady Statistic AS VP Percent: 90 %
Brady Statistic AS VS Percent: 0 %
Date Time Interrogation Session: 20211118095218
Implantable Lead Implant Date: 20140710
Implantable Lead Implant Date: 20140710
Implantable Lead Location: 753859
Implantable Lead Location: 753860
Implantable Lead Model: 5092
Implantable Lead Model: 5592
Implantable Pulse Generator Implant Date: 20140710
Lead Channel Impedance Value: 495 Ohm
Lead Channel Impedance Value: 776 Ohm
Lead Channel Pacing Threshold Amplitude: 0.375 V
Lead Channel Pacing Threshold Amplitude: 0.5 V
Lead Channel Pacing Threshold Pulse Width: 0.4 ms
Lead Channel Pacing Threshold Pulse Width: 0.4 ms
Lead Channel Setting Pacing Amplitude: 2 V
Lead Channel Setting Pacing Amplitude: 2.5 V
Lead Channel Setting Pacing Pulse Width: 0.4 ms
Lead Channel Setting Sensing Sensitivity: 4 mV

## 2020-02-23 NOTE — Progress Notes (Signed)
Remote pacemaker transmission.   

## 2020-03-06 ENCOUNTER — Other Ambulatory Visit: Payer: Self-pay | Admitting: Cardiovascular Disease

## 2020-03-10 ENCOUNTER — Ambulatory Visit: Payer: Medicare Other | Admitting: Internal Medicine

## 2020-03-10 ENCOUNTER — Ambulatory Visit (INDEPENDENT_AMBULATORY_CARE_PROVIDER_SITE_OTHER): Payer: Medicare Other | Admitting: Internal Medicine

## 2020-03-10 ENCOUNTER — Encounter: Payer: Self-pay | Admitting: Internal Medicine

## 2020-03-10 ENCOUNTER — Other Ambulatory Visit: Payer: Self-pay

## 2020-03-10 VITALS — BP 138/70 | HR 87 | Temp 98.4°F | Wt 255.0 lb

## 2020-03-10 DIAGNOSIS — F419 Anxiety disorder, unspecified: Secondary | ICD-10-CM

## 2020-03-10 DIAGNOSIS — I4891 Unspecified atrial fibrillation: Secondary | ICD-10-CM | POA: Diagnosis not present

## 2020-03-10 DIAGNOSIS — I4892 Unspecified atrial flutter: Secondary | ICD-10-CM | POA: Diagnosis not present

## 2020-03-10 DIAGNOSIS — F4321 Adjustment disorder with depressed mood: Secondary | ICD-10-CM

## 2020-03-10 DIAGNOSIS — E118 Type 2 diabetes mellitus with unspecified complications: Secondary | ICD-10-CM

## 2020-03-10 DIAGNOSIS — Z23 Encounter for immunization: Secondary | ICD-10-CM | POA: Diagnosis not present

## 2020-03-10 LAB — HEMOGLOBIN A1C: Hgb A1c MFr Bld: 8.2 % — ABNORMAL HIGH (ref 4.6–6.5)

## 2020-03-10 LAB — COMPREHENSIVE METABOLIC PANEL
ALT: 28 U/L (ref 0–35)
AST: 37 U/L (ref 0–37)
Albumin: 4.2 g/dL (ref 3.5–5.2)
Alkaline Phosphatase: 80 U/L (ref 39–117)
BUN: 13 mg/dL (ref 6–23)
CO2: 29 mEq/L (ref 19–32)
Calcium: 10.6 mg/dL — ABNORMAL HIGH (ref 8.4–10.5)
Chloride: 97 mEq/L (ref 96–112)
Creatinine, Ser: 0.93 mg/dL (ref 0.40–1.20)
GFR: 56.45 mL/min — ABNORMAL LOW (ref >60.00)
Glucose, Bld: 181 mg/dL — ABNORMAL HIGH (ref 70–99)
Potassium: 4 mEq/L (ref 3.5–5.1)
Sodium: 137 mEq/L (ref 135–145)
Total Bilirubin: 0.5 mg/dL (ref 0.2–1.2)
Total Protein: 8.3 g/dL (ref 6.0–8.3)

## 2020-03-10 NOTE — Assessment & Plan Note (Signed)
Toprol, Eliquis

## 2020-03-10 NOTE — Progress Notes (Addendum)
Subjective:  Patient ID: Jessica [name], female    DOB: July 02, 1933  Age: 86 y.o. MRN: [id number]  CC: Follow-up (3 months F/U- Flu shot )   HPI Jessica [name] presents for CHF, HTN, DM f/u Twin sister died a month ago - very sad  Outpatient Medications Prior to Visit  Medication Sig Dispense Refill  . allopurinol (ZYLOPRIM) 100 MG tablet TAKE 1 TABLET BY MOUTH EVERY DAY 30 tablet 11  . apixaban (ELIQUIS) 5 MG TABS tablet Take 1 tablet (5 mg total) by mouth 2 (two) times daily. 180 tablet 3  . b complex vitamins tablet Take 1 tablet by mouth daily.    . Blood Glucose Calibration (OT ULTRA/FASTTK CNTRL SOLN) SOLN Use as directed 1 each 0  . Blood Glucose Monitoring Suppl (ONE TOUCH ULTRA 2) w/Device KIT Use as instructed to check blood sugar BID 1 kit 0  . Cholecalciferol 1000 UNITS tablet Take 1,000 Units by mouth daily.      [name] ezetimibe (ZETIA) 10 MG tablet Take 1 tablet (10 mg total) by mouth daily. 90 tablet 3  . furosemide (LASIX) 40 MG tablet TAKE 1 TABLET TWICE A DAY 180 tablet 3  . glucose blood (ONETOUCH ULTRA) test strip Use as instructed to check blood sugar BID 200 each 3  . KLOR-CON M20 20 MEQ tablet TAKE 1 TABLET BY MOUTH EVERY DAY 90 tablet 3  . Lancets (ONETOUCH ULTRASOFT) lancets Use as instructed to check blood sugar BID 200 each 3  . levothyroxine (SYNTHROID) 75 MCG tablet Take 1 tablet (75 mcg total) by mouth daily. 90 tablet 3  . LORazepam (ATIVAN) 0.5 MG tablet TAKE 1 TABLET BY MOUTH 2 TIMES DAILY. (Patient taking differently: at bedtime. ) 180 tablet 1  . metFORMIN (GLUCOPHAGE) 500 MG tablet Take 1 tablet (500 mg total) by mouth 2 (two) times daily with a meal. 180 tablet 3  . metoprolol succinate (TOPROL-XL) 50 MG 24 hr tablet TAKE 1 TABLET (50 MG TOTAL) BY MOUTH DAILY. TAKE WITH OR IMMEDIATELY FOLLOWING A MEAL. 90 tablet 3  . Multiple Vitamin (MULTIVITAMIN WITH MINERALS) TABS tablet Take 1 tablet by mouth daily. Women's Centrum    . Multiple  Vitamins-Minerals (PRESERVISION AREDS 2 PO) Take 1 tablet by mouth 2 (two) times daily.    Vladimir Faster Glycol-Propyl Glycol (LUBRICANT EYE DROPS) 0.4-0.3 % SOLN Place 1 drop into both eyes 4 (four) times daily as needed (dry/irritated eyes.).    [name] repaglinide (PRANDIN) 2 MG tablet Take 1 tablet (2 mg total) by mouth 3 (three) times daily before meals. 270 tablet 3  . rosuvastatin (CRESTOR) 10 MG tablet Take 1 tablet by mouth up to 3 times per week as tolerated 36 tablet 3  . sacubitril-valsartan (ENTRESTO) 97-103 MG Take 1 tablet by mouth 2 (two) times daily. 60 tablet 11  . temazepam (RESTORIL) 15 MG capsule TAKE 1 CAPSULE BY MOUTH AT BEDTIME AS NEEDED FOR SLEEP 30 capsule 3  . triamcinolone ointment (KENALOG) 0.5 % Apply 1 application topically 3 (three) times daily. 60 g 1  . venlafaxine XR (EFFEXOR-XR) 75 MG 24 hr capsule TAKE 1 CAPSULE EVERY DAY WITH BREAKFAST 90 capsule 3   No facility-administered medications prior to visit.    ROS: Review of Systems  Constitutional: Positive for fatigue and unexpected weight change. Negative for activity change, appetite change and chills.  HENT: Negative for congestion, mouth sores and sinus pressure.   Eyes: Negative for visual disturbance.  Respiratory: Negative for cough and  chest tightness.   Gastrointestinal: Negative for abdominal pain and nausea.  Genitourinary: Negative for difficulty urinating, frequency and vaginal pain.  Musculoskeletal: Positive for arthralgias and back pain. Negative for gait problem.  Skin: Negative for pallor and rash.  Neurological: Negative for dizziness, tremors, weakness, numbness and headaches.  Psychiatric/Behavioral: Positive for sleep disturbance. Negative for confusion. The patient is nervous/anxious.     Objective:  BP 138/70 (BP Location: Left Arm)   Pulse 87   Temp 98.4 F (36.9 C) (Oral)   Wt 255 lb (115.7 kg)   SpO2 97%   BMI 45.17 kg/m   BP Readings from Last 3 Encounters:  03/10/20 138/70   01/07/20 140/72  12/10/19 140/60    Wt Readings from Last 3 Encounters:  03/10/20 255 lb (115.7 kg)  01/07/20 247 lb 3.2 oz (112.1 kg)  12/10/19 242 lb (109.8 kg)    Physical Exam Constitutional:      General: She is not in acute distress.    Appearance: She is well-developed. She is obese.  HENT:     Head: Normocephalic.     Right Ear: External ear normal.     Left Ear: External ear normal.     Nose: Nose normal.  Eyes:     General:        Right eye: No discharge.        Left eye: No discharge.     Conjunctiva/sclera: Conjunctivae normal.     Pupils: Pupils are equal, round, and reactive to light.  Neck:     Thyroid: No thyromegaly.     Vascular: No JVD.     Trachea: No tracheal deviation.  Cardiovascular:     Rate and Rhythm: Normal rate. Rhythm irregular.     Heart sounds: Normal heart sounds.  Pulmonary:     Effort: No respiratory distress.     Breath sounds: No stridor. No wheezing.  Abdominal:     General: Bowel sounds are normal. There is no distension.     Palpations: Abdomen is soft. There is no mass.     Tenderness: There is no abdominal tenderness. There is no guarding or rebound.  Musculoskeletal:        General: No tenderness.     Cervical back: Normal range of motion and neck supple.  Lymphadenopathy:     Cervical: No cervical adenopathy.  Skin:    Findings: No erythema or rash.  Neurological:     Mental Status: She is oriented to person, place, and time.     Cranial Nerves: No cranial nerve deficit.     Motor: No abnormal muscle tone.     Coordination: Coordination abnormal.     Deep Tendon Reflexes: Reflexes normal.  Psychiatric:        Behavior: Behavior normal.        Thought Content: Thought content normal.        Judgment: Judgment normal.     Lab Results  Component Value Date   WBC 6.7 12/10/2019   HGB 14.4 12/10/2019   HCT 42.8 12/10/2019   PLT 259 12/10/2019   GLUCOSE 175 (H) 12/16/2019   CHOL 226 (H) 12/16/2019   TRIG 230  (H) 12/16/2019   HDL 36 (L) 12/16/2019   LDLDIRECT 155.9 09/30/2012   LDLCALC 148 (H) 12/16/2019   ALT 23 12/16/2019   AST 24 12/16/2019   NA 138 12/16/2019   K 4.3 12/16/2019   CL 99 12/16/2019   CREATININE 0.96 12/16/2019   BUN 13 12/16/2019  CO2 23 12/16/2019   TSH 2.771 06/22/2017   INR 1.2 01/10/2019   HGBA1C 12.2 (H) 12/10/2019   MICROALBUR 4.5 (H) 12/02/2009    CT Chest Wo Contrast  Result Date: 07/09/2019 CLINICAL DATA:  Follow-up of inferior lingular nodule. EXAM: CT CHEST WITHOUT CONTRAST TECHNIQUE: Multidetector CT imaging of the chest was performed following the standard protocol without IV contrast. COMPARISON:  CTA of the chest of 01/07/2019. FINDINGS: Cardiovascular: Aortic atherosclerosis. Aortic valve repair. Tortuous thoracic aorta. Mild cardiomegaly, without pericardial effusion. Pacer. Lad coronary artery calcification. Pulmonary artery enlargement, outflow tract 3.1 cm Mediastinum/Nodes: No mediastinal or definite hilar adenopathy, given limitations of unenhanced CT. Moderate hiatal hernia. Lungs/Pleura: No pleural fluid. Lingular scarring. Lingular nodule of 7 mm on 72/3 is similar on the prior exam and back on 10/07/2008, considered benign. Posterior left upper lobe calcified granulomas. Upper Abdomen: Hepatic steatosis. Normal imaged portions of the spleen, pancreas, adrenal glands, left kidney. Musculoskeletal: Advanced lower thoracic spondylosis. Eccentric right T12 vertebral hemangioma. IMPRESSION: 1. Similar lingular nodule, including back to 10/07/2008. This can be considered benign. 2. Pulmonary artery enlargement suggests pulmonary arterial hypertension. 3. Moderate hiatal hernia. 4. Hepatic steatosis. 5. Coronary artery atherosclerosis. Aortic Atherosclerosis (ICD10-I70.0). Electronically Signed   By: Abigail Miyamoto M.D.   On: 07/09/2019 12:40    Assessment & Plan:   Walker Kehr, MD

## 2020-03-10 NOTE — Addendum Note (Signed)
Addended by: Boris Lown B on: 03/10/2020 02:30 PM   Modules accepted: Orders

## 2020-03-10 NOTE — Assessment & Plan Note (Signed)
Twin sister died a month ago - very sad

## 2020-03-10 NOTE — Assessment & Plan Note (Addendum)
On Metformin Added Prandin

## 2020-03-10 NOTE — Assessment & Plan Note (Signed)
Worse Wt Readings from Last 3 Encounters:  03/10/20 255 lb (115.7 kg)  01/07/20 247 lb 3.2 oz (112.1 kg)  12/10/19 242 lb (109.8 kg)  Diet discussed

## 2020-03-12 ENCOUNTER — Telehealth: Payer: Self-pay | Admitting: *Deleted

## 2020-03-12 MED ORDER — APIXABAN 5 MG PO TABS
5.0000 mg | ORAL_TABLET | Freq: Two times a day (BID) | ORAL | 3 refills | Status: DC
Start: 2020-03-12 — End: 2020-05-24

## 2020-03-12 NOTE — Telephone Encounter (Signed)
Pt drop off Pt Assistance form for Eliquis 5 mg- Jessica [name] form completed place on MD desk to sign.Marland KitchenJohny [name]

## 2020-03-12 NOTE — Telephone Encounter (Signed)
Okay.  Thanks.

## 2020-03-15 NOTE — Telephone Encounter (Signed)
Pt Assistance Forms faxed to [facility].Marland KitchenJohny Chess

## 2020-03-20 ENCOUNTER — Other Ambulatory Visit: Payer: Self-pay | Admitting: Internal Medicine

## 2020-03-22 ENCOUNTER — Telehealth: Payer: Self-pay | Admitting: Internal Medicine

## 2020-03-22 MED ORDER — LEVOTHYROXINE SODIUM 75 MCG PO TABS
75.0000 ug | ORAL_TABLET | Freq: Every day | ORAL | 1 refills | Status: DC
Start: 2020-03-22 — End: 2020-06-09

## 2020-03-22 NOTE — Telephone Encounter (Signed)
Reviewed chart pt is up-to-date sent refills to [facility].../lmb  

## 2020-03-22 NOTE — Telephone Encounter (Signed)
1.Medication Requested: levothyroxine (SYNTHROID) 75 MCG tablet    2. Pharmacy (Name, Street, [place]):  [id number] - [place], [place]  3. On Med List: yes   4. Last Visit with PCP: 12.8.21   5. Next visit date with PCP: 3.9.22   Agent: Please be advised that RX refills may take up to 3 business days. We ask that you follow-up with your pharmacy.

## 2020-03-24 ENCOUNTER — Telehealth: Payer: Self-pay | Admitting: Internal Medicine

## 2020-03-24 NOTE — Telephone Encounter (Signed)
Called [employer] Pt assistance back spoke w/rep Levada Dy she states on the provider portion he did not answer question " Is the pt receiving treatment outpatient". She states to answer "yes" and faxed back...Johny Chess

## 2020-03-24 NOTE — Telephone Encounter (Signed)
Calling about the application for eliquis and states on the application there was no outpatient status of the patient. [phone]

## 2020-03-30 NOTE — Telephone Encounter (Signed)
Patient medical assitance calling and stating that these forms were supposed to mailed and not faxed and there were several things they never received.  [phone]

## 2020-04-12 NOTE — Telephone Encounter (Signed)
Patient medical assistance calling stating they are still missing these forms. Would like to speak to someone about this so the patient can get her medication. They are there from 6:30 am- 2:30 pm M-F   [phone]

## 2020-04-21 ENCOUNTER — Other Ambulatory Visit: Payer: Self-pay | Admitting: Internal Medicine

## 2020-04-23 NOTE — Telephone Encounter (Signed)
Tried calling pt to inform her that the Pt Assistance for her Eliquis sent new forms to be filled out. They state that theyt never received the pt portion along w/ proof of finances. Called home # no one answered. Called cell # no one answered, and can't leave msg due to vm full.Marland KitchenJohny [name]

## 2020-04-27 ENCOUNTER — Telehealth: Payer: Self-pay | Admitting: Cardiovascular Disease

## 2020-04-27 MED ORDER — ENTRESTO 97-103 MG PO TABS: 1.0000 | ORAL_TABLET | Freq: Two times a day (BID) | ORAL | 11 refills | Status: DC

## 2020-04-27 NOTE — Telephone Encounter (Signed)
Refill request sent to the requested pharmacy.

## 2020-04-27 NOTE — Telephone Encounter (Signed)
*  STAT* If patient is at the pharmacy, call can be transferred to refill team.   1. Which medications need to be refilled? (please list name of each medication and dose if known)  sacubitril-valsartan (ENTRESTO) 97-103 MG  2. Which pharmacy/location (including street and city if local pharmacy) is medication to be sent to? [id number] - [place], [place] - [address] AT [address]  3. Do they need a 30 day or 90 day supply? [place]

## 2020-04-29 ENCOUNTER — Telehealth: Payer: Self-pay | Admitting: Cardiovascular Disease

## 2020-04-29 NOTE — Telephone Encounter (Signed)
2 boxes Eliquis 5 mg Lot [id number] Exp 4/23

## 2020-04-29 NOTE — Telephone Encounter (Signed)
Spoke to patient-she states she cannot pay the $418 for 30 day supply of Eliquis.   She has not applied for assistance through [employer].       She ran out of medicine and did not have any this morning.   This is the first prescription refill for Eliquis in 2022.  Advised this may be due to deductable/out of pocket expense not being met yet.   She would like to apply for patient assistance.      Samples placed at front desk as well as patient assistance forms to complete asap.  Patient will have neighbor come get samples today.

## 2020-04-29 NOTE — Telephone Encounter (Signed)
Pt c/o medication issue:  1. Name of Medication: apixaban (ELIQUIS) 5 MG TABS tablet    2. How are you currently taking this medication (dosage and times per day)? As prescribed.  3. Are you having a reaction (difficulty breathing--STAT)? No.  4. What is your medication issue? Patient states that this medication is too expensive and would like Dr. Oval Linsey to give her a suggestion for something cheaper. Please advise.  Patient is out of medication.

## 2020-04-30 ENCOUNTER — Telehealth: Payer: Self-pay | Admitting: Cardiovascular Disease

## 2020-04-30 NOTE — Telephone Encounter (Signed)
Pt voiced she had not used the 30 day free card before. Card and patient assistance form placed up front for pick up.

## 2020-04-30 NOTE — Telephone Encounter (Signed)
Pt called stating she is unable to afford Entresto and was informed the cost for new refill is $482. Pt questioning if MD could recommend a more affordable mediation.   Will forward to MD for review.  Patient assistance application placed in the mail for pt to complete.

## 2020-04-30 NOTE — Telephone Encounter (Signed)
This may be a beginning of the year copay issue.  Can we investigate?  If it won't be affordable long term then valsartan 320mg  daily.

## 2020-04-30 NOTE — Telephone Encounter (Signed)
Pt c/o medication issue:  1. Name of Medication: sacubitril-valsartan (ENTRESTO) 97-103 MG  2. How are you currently taking this medication (dosage and times per day)? 1 tablet twice a day  3. Are you having a reaction (difficulty breathing--STAT)? no  4. What is your medication issue? Patient states the medication is $482. She would like to know if there is another medication she can take.

## 2020-04-30 NOTE — Telephone Encounter (Signed)
Patient already used 30 day free card? Otherwise, please provide 30 day free card and initiate patient assistance paperwork. (sadly 97/103 strength is not sampled by company)  Prior-authorization was completed in July.  Co-pay for Eliquis and Delene Loll should go down after deductible covered, but we can still try patient assistance if patient agreeable.

## 2020-05-06 ENCOUNTER — Telehealth: Payer: Self-pay | Admitting: *Deleted

## 2020-05-06 NOTE — Telephone Encounter (Signed)
Rec'd form for Pt Mediation Assistance for Jardiance 10 mg. Per chart medication is not listed on med list. Will call pt to f/u.Marland KitchenJohny Chess

## 2020-05-07 ENCOUNTER — Telehealth: Payer: Self-pay | Admitting: Internal Medicine

## 2020-05-07 NOTE — Telephone Encounter (Signed)
Duplicate msg see previous msg form remote nurse,,.LMB

## 2020-05-07 NOTE — Telephone Encounter (Signed)
° °  Patient calling to report she has not been taking Eliquis since October She states she can not afford medication

## 2020-05-07 NOTE — Telephone Encounter (Signed)
Jessica [name] w/ [employer] called and said that the patient told her that she has not taken apixaban (ELIQUIS) 5 MG TABS tablet in over a month because of the copay. She was wondering if something else could be called in or if we could help her get into the patient assistance program.

## 2020-05-10 NOTE — Telephone Encounter (Signed)
I am sorry.  She can take a baby aspirin enteric-coated 81 mg twice a day for now.  Thanks

## 2020-05-11 NOTE — Telephone Encounter (Signed)
Tried calling pt to inform her of MD response. Home # goes straight to a recording " can't take your call now", and not able to leave msg. Called cell # no answer and can't leave msg due to vm being full.Marland KitchenJohny [name]

## 2020-05-17 ENCOUNTER — Telehealth: Payer: Self-pay | Admitting: Internal Medicine

## 2020-05-17 NOTE — Telephone Encounter (Signed)
Mikki Santee w/ Patient Medication Assistance called and said that they have not received the forms for apixaban (ELIQUIS) 5 MG TABS tablet or empagliflozin (JARDIANCE) 10 MG TABS tablet. He said that they were faxed over back in December. Please advise. Please call Mikki Santee back at [phone].   Fax: [phone] Phone: [phone]

## 2020-05-18 NOTE — Telephone Encounter (Signed)
Jessica [name] from [employer] called in regards to the patient still needing an ELIQUIS refill (apixaban) .   The prescription assistance program still needs a response from the doctor in order to give her the prescription refill.  Since previously stated we could not get in contact with the patient, Danton Clap will try to get her to reach out to [place] in regards to the issue.  Good call back number for [facility] [name]: [phone]

## 2020-05-19 NOTE — Telephone Encounter (Signed)
Pt return call back and I was finally able to speak w/ her. Inform her application and rx for Eliquis was faxed this am to [facility]. Verified if she been taking the Jardiance. Pt states no MD gave her a substitute " generic Prandin" and she been taking that since insurance wouldn't cover. Inform her per Mikki Santee they would ship eliquis out today.Marland KitchenJohny Chess

## 2020-05-19 NOTE — Telephone Encounter (Signed)
Patient called and said she can be reached at [phone]

## 2020-05-19 NOTE — Telephone Encounter (Signed)
Called Alice back inform her that we have been trying to contact pt but call go straight to vm and then we can't leave vm due to vm being full. Forms was fax for the eliquis but they fax info back needing pt income information. Need to verify Jardiance.. med is not on her med list. Danton Clap states she will give pt a call and have her to call so I can talk w/her...Johny Chess

## 2020-05-19 NOTE — Telephone Encounter (Signed)
Tried calling pt on several occassions. # goes straight to vm and can't leave msg due to vm being full. We did receive forms but cn't get in comtact w/the pt.Marland KitchenJohny [name]

## 2020-05-19 NOTE — Telephone Encounter (Signed)
Francina Ames he states to refax application to [phone], and he will take care of the Eliquis. Far as the jardiance inform him that we are waiting to speak w/pt med is not on her med list. Need to make sure she is taking.Marland KitchenJohny Chess

## 2020-05-19 NOTE — Telephone Encounter (Signed)
Okay Eliquis and Jardiance she was on [place] before.  Thanks

## 2020-05-19 NOTE — Telephone Encounter (Signed)
See duplicate msg's.. Pt will be receiving assistance for the Eliquis. Forms has been faxed back.Marland KitchenJohny [name]

## 2020-05-19 NOTE — Telephone Encounter (Signed)
Called pt again... keep getting fast busy signal on home # [phone]. Called cell # no answer and can't leave msg due to vm.Marland KitchenJohny [name]

## 2020-05-20 ENCOUNTER — Ambulatory Visit (INDEPENDENT_AMBULATORY_CARE_PROVIDER_SITE_OTHER): Payer: Medicare Other

## 2020-05-20 DIAGNOSIS — I442 Atrioventricular block, complete: Secondary | ICD-10-CM | POA: Diagnosis not present

## 2020-05-20 LAB — CUP PACEART REMOTE DEVICE CHECK
Battery Impedance: 804 Ohm
Battery Remaining Longevity: 70 mo
Battery Voltage: 2.78 V
Brady Statistic AP VP Percent: 10 %
Brady Statistic AP VS Percent: 0 %
Brady Statistic AS VP Percent: 90 %
Brady Statistic AS VS Percent: 0 %
Date Time Interrogation Session: 20220217115040
Implantable Lead Implant Date: 20140710
Implantable Lead Implant Date: 20140710
Implantable Lead Location: 753859
Implantable Lead Location: 753860
Implantable Lead Model: 5092
Implantable Lead Model: 5592
Implantable Pulse Generator Implant Date: 20140710
Lead Channel Impedance Value: 550 Ohm
Lead Channel Impedance Value: 755 Ohm
Lead Channel Pacing Threshold Amplitude: 0.375 V
Lead Channel Pacing Threshold Amplitude: 0.625 V
Lead Channel Pacing Threshold Pulse Width: 0.4 ms
Lead Channel Pacing Threshold Pulse Width: 0.4 ms
Lead Channel Setting Pacing Amplitude: 2 V
Lead Channel Setting Pacing Amplitude: 2.5 V
Lead Channel Setting Pacing Pulse Width: 0.4 ms
Lead Channel Setting Sensing Sensitivity: 4 mV

## 2020-05-21 NOTE — Telephone Encounter (Signed)
Jessica [name] calling states when he got the fax it was blurry, if we could re fax it again to [phone]

## 2020-05-24 ENCOUNTER — Telehealth: Payer: Self-pay | Admitting: Internal Medicine

## 2020-05-24 MED ORDER — APIXABAN 5 MG PO TABS: 5.0000 mg | ORAL_TABLET | Freq: Two times a day (BID) | ORAL | 3 refills | Status: DC

## 2020-05-24 MED ORDER — VENLAFAXINE HCL ER 75 MG PO CP24: ORAL_CAPSULE | ORAL | 1 refills | Status: DC

## 2020-05-24 MED ORDER — FUROSEMIDE 40 MG PO TABS: 40.0000 mg | ORAL_TABLET | Freq: Two times a day (BID) | ORAL | 1 refills | Status: DC

## 2020-05-24 NOTE — Telephone Encounter (Signed)
   [facility] calling to request refill for   venlafaxine XR (EFFEXOR-XR) 75 MG 24 hr capsule furosemide (LASIX) 40 MG tablet

## 2020-05-24 NOTE — Telephone Encounter (Deleted)
Refax eliquis app to [phone].Marland KitchenJohny Chess

## 2020-05-24 NOTE — Telephone Encounter (Signed)
Reviewed chart pt is up-to-date sent refills to [facility].../lmb  

## 2020-05-24 NOTE — Telephone Encounter (Signed)
forms was sent scan and has not been scan into chart will contact Mikki Santee to see if he can send another app.Marland KitchenJohny Chess

## 2020-05-24 NOTE — Addendum Note (Signed)
Addended by: Earnstine Regal on: 05/24/2020 04:14 PM   Modules accepted: Orders

## 2020-05-26 NOTE — Telephone Encounter (Signed)
Called Mikki Santee there was no answer LMOM RTC.Marland KitchenJohny Chess

## 2020-05-26 NOTE — Telephone Encounter (Signed)
    Please call Mikki Santee with patient assistance

## 2020-05-26 NOTE — Telephone Encounter (Signed)
Mikki Santee returning call, please call back

## 2020-05-26 NOTE — Progress Notes (Signed)
Remote pacemaker transmission.   

## 2020-05-27 NOTE — Telephone Encounter (Signed)
Reprinted application completed and faxed back to [facility].Marland KitchenJohny Chess

## 2020-05-27 NOTE — Telephone Encounter (Signed)
Faxed script/appplication back and I received conformation that it was received.Marland KitchenJohny Chess

## 2020-05-27 NOTE — Telephone Encounter (Signed)
Jessica [name] w/ Patient Medication [employer] is requesting that the forms be re sent. He said that he did not receive the prescription. Please advise. He is requesting a call back at [phone]

## 2020-06-01 ENCOUNTER — Ambulatory Visit: Payer: Medicare Other | Admitting: Cardiovascular Disease

## 2020-06-02 ENCOUNTER — Other Ambulatory Visit: Payer: Self-pay | Admitting: Cardiovascular Disease

## 2020-06-03 ENCOUNTER — Other Ambulatory Visit: Payer: Self-pay | Admitting: *Deleted

## 2020-06-03 NOTE — Telephone Encounter (Signed)
Pt no longer using [facility]. Pt is using [facility]. Pt has appt on 06/09/20 will hold until appt to make sure if she needs.Marland KitchenJohny [name]

## 2020-06-09 ENCOUNTER — Ambulatory Visit (INDEPENDENT_AMBULATORY_CARE_PROVIDER_SITE_OTHER): Payer: Medicare Other | Admitting: Internal Medicine

## 2020-06-09 ENCOUNTER — Encounter: Payer: Self-pay | Admitting: Internal Medicine

## 2020-06-09 ENCOUNTER — Other Ambulatory Visit: Payer: Self-pay

## 2020-06-09 DIAGNOSIS — F419 Anxiety disorder, unspecified: Secondary | ICD-10-CM | POA: Diagnosis not present

## 2020-06-09 DIAGNOSIS — E039 Hypothyroidism, unspecified: Secondary | ICD-10-CM

## 2020-06-09 DIAGNOSIS — E118 Type 2 diabetes mellitus with unspecified complications: Secondary | ICD-10-CM

## 2020-06-09 DIAGNOSIS — I10 Essential (primary) hypertension: Secondary | ICD-10-CM | POA: Diagnosis not present

## 2020-06-09 LAB — COMPREHENSIVE METABOLIC PANEL
ALT: 27 U/L (ref 0–35)
AST: 37 U/L (ref 0–37)
Albumin: 4.3 g/dL (ref 3.5–5.2)
Alkaline Phosphatase: 90 U/L (ref 39–117)
BUN: 12 mg/dL (ref 6–23)
CO2: 24 mEq/L (ref 19–32)
Calcium: 10.2 mg/dL (ref 8.4–10.5)
Chloride: 98 mEq/L (ref 96–112)
Creatinine, Ser: 0.93 mg/dL (ref 0.40–1.20)
GFR: 56.35 mL/min — ABNORMAL LOW (ref >60.00)
Glucose, Bld: 283 mg/dL — ABNORMAL HIGH (ref 70–99)
Potassium: 4.1 mEq/L (ref 3.5–5.1)
Sodium: 138 mEq/L (ref 135–145)
Total Bilirubin: 0.5 mg/dL (ref 0.2–1.2)
Total Protein: 9 g/dL — ABNORMAL HIGH (ref 6.0–8.3)

## 2020-06-09 LAB — HEMOGLOBIN A1C: Hgb A1c MFr Bld: 9.3 % — ABNORMAL HIGH (ref 4.6–6.5)

## 2020-06-09 LAB — T4, FREE: Free T4: 0.89 ng/dL (ref 0.60–1.60)

## 2020-06-09 LAB — TSH: TSH: 2.46 u[IU]/mL (ref 0.35–4.50)

## 2020-06-09 MED ORDER — VENLAFAXINE HCL ER 75 MG PO CP24: ORAL_CAPSULE | ORAL | 3 refills | Status: DC

## 2020-06-09 MED ORDER — REPAGLINIDE 2 MG PO TABS: 2.0000 mg | ORAL_TABLET | Freq: Three times a day (TID) | ORAL | 3 refills | Status: DC

## 2020-06-09 MED ORDER — FUROSEMIDE 40 MG PO TABS: 40.0000 mg | ORAL_TABLET | Freq: Two times a day (BID) | ORAL | 3 refills | Status: DC

## 2020-06-09 MED ORDER — POTASSIUM CHLORIDE CRYS ER 20 MEQ PO TBCR: 20.0000 meq | EXTENDED_RELEASE_TABLET | Freq: Every day | ORAL | 3 refills | Status: DC

## 2020-06-09 MED ORDER — METFORMIN HCL 500 MG PO TABS: 500.0000 mg | ORAL_TABLET | Freq: Two times a day (BID) | ORAL | 3 refills | Status: DC

## 2020-06-09 MED ORDER — LORAZEPAM 0.5 MG PO TABS
0.5000 mg | ORAL_TABLET | Freq: Two times a day (BID) | ORAL | 1 refills | Status: DC | PRN
Start: 2020-06-09 — End: 2021-03-18

## 2020-06-09 MED ORDER — ALLOPURINOL 100 MG PO TABS: 100.0000 mg | ORAL_TABLET | Freq: Every day | ORAL | 3 refills | Status: DC

## 2020-06-09 MED ORDER — LEVOTHYROXINE SODIUM 75 MCG PO TABS: 75.0000 ug | ORAL_TABLET | Freq: Every day | ORAL | 3 refills | Status: DC

## 2020-06-09 NOTE — Assessment & Plan Note (Signed)
On Levothroid 

## 2020-06-09 NOTE — Addendum Note (Signed)
Addended by: Jacobo Forest on: 06/09/2020 11:00 AM   Modules accepted: Orders

## 2020-06-09 NOTE — Progress Notes (Signed)
Subjective:  Patient ID: Jessica [name], female    DOB: [birth year]  Age: 87 y.o. MRN: [id number]  CC: Follow-up (6 month f/u)   HPI Jessica [name] presents for HTN, CHF, DM, anxiety f/u  Outpatient Medications Prior to Visit  Medication Sig Dispense Refill  . allopurinol (ZYLOPRIM) 100 MG tablet TAKE 1 TABLET BY MOUTH EVERY DAY 30 tablet 11  . apixaban (ELIQUIS) 5 MG TABS tablet Take 1 tablet (5 mg total) by mouth 2 (two) times daily. 180 tablet 3  . b complex vitamins tablet Take 1 tablet by mouth daily.    . Blood Glucose Calibration (OT ULTRA/FASTTK CNTRL SOLN) SOLN Use as directed 1 each 0  . Blood Glucose Monitoring Suppl (ONE TOUCH ULTRA 2) w/Device KIT Use as instructed to check blood sugar BID 1 kit 0  . Cholecalciferol 1000 UNITS tablet Take 1,000 Units by mouth daily.    . furosemide (LASIX) 40 MG tablet Take 1 tablet (40 mg total) by mouth 2 (two) times daily. 180 tablet 1  . glucose blood (ONETOUCH ULTRA) test strip Use as instructed to check blood sugar BID 200 each 3  . KLOR-CON M20 20 MEQ tablet TAKE 1 TABLET BY MOUTH EVERY DAY 90 tablet 3  . Lancets (ONETOUCH ULTRASOFT) lancets Use as instructed to check blood sugar BID 200 each 3  . levothyroxine (SYNTHROID) 75 MCG tablet Take 1 tablet (75 mcg total) by mouth daily. 90 tablet 1  . LORazepam (ATIVAN) 0.5 MG tablet Take 1 tablet (0.5 mg total) by mouth 2 (two) times daily as needed for anxiety. 180 tablet 1  . metFORMIN (GLUCOPHAGE) 500 MG tablet Take 1 tablet (500 mg total) by mouth 2 (two) times daily with a meal. 180 tablet 3  . metoprolol succinate (TOPROL-XL) 50 MG 24 hr tablet TAKE 1 TABLET (50 MG TOTAL) BY MOUTH DAILY. TAKE WITH OR IMMEDIATELY FOLLOWING A MEAL. 90 tablet 3  . Multiple Vitamin (MULTIVITAMIN WITH MINERALS) TABS tablet Take 1 tablet by mouth daily. Women's Centrum    . Multiple Vitamins-Minerals (PRESERVISION AREDS 2 PO) Take 1 tablet by mouth 2 (two) times daily.    Vladimir Faster Glycol-Propyl  Glycol (LUBRICANT EYE DROPS) 0.4-0.3 % SOLN Place 1 drop into both eyes 4 (four) times daily as needed (dry/irritated eyes.).    [name] repaglinide (PRANDIN) 2 MG tablet Take 1 tablet (2 mg total) by mouth 3 (three) times daily before meals. 270 tablet 3  . rosuvastatin (CRESTOR) 10 MG tablet Take 1 tablet by mouth up to 3 times per week as tolerated 36 tablet 3  . sacubitril-valsartan (ENTRESTO) 97-103 MG Take 1 tablet by mouth 2 (two) times daily. 60 tablet 11  . temazepam (RESTORIL) 15 MG capsule TAKE 1 CAPSULE BY MOUTH AT BEDTIME AS NEEDED FOR SLEEP 30 capsule 3  . triamcinolone ointment (KENALOG) 0.5 % Apply 1 application topically 3 (three) times daily. 60 g 1  . venlafaxine XR (EFFEXOR-XR) 75 MG 24 hr capsule TAKE 1 CAPSULE EVERY DAY WITH BREAKFAST 90 capsule 1  . ezetimibe (ZETIA) 10 MG tablet Take 1 tablet (10 mg total) by mouth daily. 90 tablet 3   No facility-administered medications prior to visit.    ROS: Review of Systems  Constitutional: Positive for fatigue. Negative for activity change, appetite change, chills and unexpected weight change.  HENT: Negative for congestion, mouth sores and sinus pressure.   Eyes: Negative for visual disturbance.  Respiratory: Negative for cough and chest tightness.   Gastrointestinal: Negative  for abdominal pain and nausea.  Genitourinary: Negative for difficulty urinating, frequency and vaginal pain.  Musculoskeletal: Positive for arthralgias and back pain. Negative for gait problem.  Skin: Negative for pallor and rash.  Neurological: Negative for dizziness, tremors, weakness, numbness and headaches.  Psychiatric/Behavioral: Positive for sleep disturbance. Negative for confusion and suicidal ideas. The patient is nervous/anxious.     Objective:  BP 130/82 (BP Location: Left Arm)   Pulse 94   Temp 98.1 F (36.7 C) (Oral)   Ht '5\' 3"'  (1.6 m)   Wt 252 lb 9.6 oz (114.6 kg)   SpO2 97%   BMI 44.75 kg/m   BP Readings from Last 3 Encounters:   06/09/20 130/82  03/10/20 138/70  01/07/20 140/72    Wt Readings from Last 3 Encounters:  06/09/20 252 lb 9.6 oz (114.6 kg)  03/10/20 255 lb (115.7 kg)  01/07/20 247 lb 3.2 oz (112.1 kg)    Physical Exam Constitutional:      General: She is not in acute distress.    Appearance: She is well-developed. She is obese.  HENT:     Head: Normocephalic.     Right Ear: External ear normal.     Left Ear: External ear normal.     Nose: Nose normal.     Mouth/Throat:     Mouth: Oropharynx is clear and moist.  Eyes:     General:        Right eye: No discharge.        Left eye: No discharge.     Conjunctiva/sclera: Conjunctivae normal.     Pupils: Pupils are equal, round, and reactive to light.  Neck:     Thyroid: No thyromegaly.     Vascular: No JVD.     Trachea: No tracheal deviation.  Cardiovascular:     Rate and Rhythm: Normal rate and regular rhythm.     Heart sounds: Normal heart sounds.  Pulmonary:     Effort: No respiratory distress.     Breath sounds: No stridor. No wheezing.  Abdominal:     General: Bowel sounds are normal. There is no distension.     Palpations: Abdomen is soft. There is no mass.     Tenderness: There is no abdominal tenderness. There is no guarding or rebound.  Musculoskeletal:        General: Tenderness present. No edema.     Cervical back: Normal range of motion and neck supple.  Lymphadenopathy:     Cervical: No cervical adenopathy.  Skin:    Findings: No erythema or rash.  Neurological:     Cranial Nerves: No cranial nerve deficit.     Motor: No abnormal muscle tone.     Coordination: Coordination normal.     Deep Tendon Reflexes: Reflexes normal.  Psychiatric:        Mood and Affect: Mood and affect normal.        Behavior: Behavior normal.        Thought Content: Thought content normal.        Judgment: Judgment normal.   Using a cane  Lab Results  Component Value Date   WBC 6.7 12/10/2019   HGB 14.4 12/10/2019   HCT 42.8  12/10/2019   PLT 259 12/10/2019   GLUCOSE 181 (H) 03/10/2020   CHOL 226 (H) 12/16/2019   TRIG 230 (H) 12/16/2019   HDL 36 (L) 12/16/2019   LDLDIRECT 155.9 09/30/2012   LDLCALC 148 (H) 12/16/2019   ALT 28 03/10/2020  AST 37 03/10/2020   NA 137 03/10/2020   K 4.0 03/10/2020   CL 97 03/10/2020   CREATININE 0.93 03/10/2020   BUN 13 03/10/2020   CO2 29 03/10/2020   TSH 2.771 06/22/2017   INR 1.2 01/10/2019   HGBA1C 8.2 (H) 03/10/2020   MICROALBUR 4.5 (H) 12/02/2009    CT Chest Wo Contrast  Result Date: 07/09/2019 CLINICAL DATA:  Follow-up of inferior lingular nodule. EXAM: CT CHEST WITHOUT CONTRAST TECHNIQUE: Multidetector CT imaging of the chest was performed following the standard protocol without IV contrast. COMPARISON:  CTA of the chest of 01/07/2019. FINDINGS: Cardiovascular: Aortic atherosclerosis. Aortic valve repair. Tortuous thoracic aorta. Mild cardiomegaly, without pericardial effusion. Pacer. Lad coronary artery calcification. Pulmonary artery enlargement, outflow tract 3.1 cm Mediastinum/Nodes: No mediastinal or definite hilar adenopathy, given limitations of unenhanced CT. Moderate hiatal hernia. Lungs/Pleura: No pleural fluid. Lingular scarring. Lingular nodule of 7 mm on 72/3 is similar on the prior exam and back on 10/07/2008, considered benign. Posterior left upper lobe calcified granulomas. Upper Abdomen: Hepatic steatosis. Normal imaged portions of the spleen, pancreas, adrenal glands, left kidney. Musculoskeletal: Advanced lower thoracic spondylosis. Eccentric right T12 vertebral hemangioma. IMPRESSION: 1. Similar lingular nodule, including back to 10/07/2008. This can be considered benign. 2. Pulmonary artery enlargement suggests pulmonary arterial hypertension. 3. Moderate hiatal hernia. 4. Hepatic steatosis. 5. Coronary artery atherosclerosis. Aortic Atherosclerosis (ICD10-I70.0). Electronically Signed   By: Abigail Miyamoto M.D.   On: 07/09/2019 12:40    Assessment & Plan:     Follow-up: No follow-ups on file.  Walker Kehr, MD

## 2020-06-09 NOTE — Assessment & Plan Note (Signed)
Wt Readings from Last 3 Encounters:  06/09/20 252 lb 9.6 oz (114.6 kg)  03/10/20 255 lb (115.7 kg)  01/07/20 247 lb 3.2 oz (112.1 kg)  Better

## 2020-06-09 NOTE — Assessment & Plan Note (Signed)
Triamt/HCTZ, Losartan

## 2020-06-09 NOTE — Assessment & Plan Note (Signed)
  Lorazepam prn  Potential benefits of a long term prn benzodiazepines  use as well as potential risks  and complications were explained to the patient and were aknowledged.

## 2020-06-09 NOTE — Assessment & Plan Note (Addendum)
On Prandin, Metformin A1c

## 2020-06-15 ENCOUNTER — Ambulatory Visit: Payer: Medicare Other | Admitting: Cardiovascular Disease

## 2020-06-15 ENCOUNTER — Encounter: Payer: Self-pay | Admitting: Cardiovascular Disease

## 2020-06-15 ENCOUNTER — Other Ambulatory Visit: Payer: Self-pay

## 2020-06-15 VITALS — BP 118/72 | HR 78 | Ht 62.0 in | Wt 252.8 lb

## 2020-06-15 DIAGNOSIS — I5032 Chronic diastolic (congestive) heart failure: Secondary | ICD-10-CM

## 2020-06-15 DIAGNOSIS — Z952 Presence of prosthetic heart valve: Secondary | ICD-10-CM

## 2020-06-15 DIAGNOSIS — I4891 Unspecified atrial fibrillation: Secondary | ICD-10-CM

## 2020-06-15 DIAGNOSIS — I4892 Unspecified atrial flutter: Secondary | ICD-10-CM

## 2020-06-15 DIAGNOSIS — Z6841 Body Mass Index (BMI) 40.0 and over, adult: Secondary | ICD-10-CM | POA: Diagnosis not present

## 2020-06-15 DIAGNOSIS — E7800 Pure hypercholesterolemia, unspecified: Secondary | ICD-10-CM | POA: Diagnosis not present

## 2020-06-15 DIAGNOSIS — I10 Essential (primary) hypertension: Secondary | ICD-10-CM
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
## 2020-06-15 NOTE — Patient Instructions (Addendum)
Medication Instructions:  No Changes In Medications at this time.   PLEASE RETURN PATIENT ASSISTANCE FORMS FOR ELIQUIS BACK TO OFFICE ONCE COMPLETE  SAMPLES OF ELIQUIS GIVEN TODAY- 3 BOXES  *If you need a refill on your cardiac medications before your next appointment, please call your pharmacy*  Follow-Up: At [facility], you and your health needs are our priority.  As part of our continuing mission to provide you with exceptional heart care, we have created designated Provider Care Teams.  These Care Teams include your primary Cardiologist (physician) and Advanced Practice Providers (APPs -  Physician Assistants and Nurse Practitioners) who all work together to provide you with the care you need, when you need it.  We recommend signing up for the patient portal called "MyChart".  Sign up information is provided on this After Visit Summary.  MyChart is used to connect with patients for Virtual Visits (Telemedicine).  Patients are able to view lab/test results, encounter notes, upcoming appointments, etc.  Non-urgent messages can be sent to your provider as well.   To learn more about what you can do with MyChart, go to [email or web address].    Your next appointment:   6 month(s)  The format for your next appointment:   In Person  Provider:   Skeet Latch, MD   REFERRAL TO [place] TO IN REGARDS TO THIS  PLEASE SCHEDULE AN APPOINTMENT AT NEXT AVAILABLE WITH CV[facility] LIPIDS FOR PCSK9.

## 2020-06-18 ENCOUNTER — Telehealth: Payer: Self-pay

## 2020-06-18 NOTE — Telephone Encounter (Signed)
Call placed to pt reference referral to [place] program to pt Not interested at this time Confirmed she has my number should she change her mind

## 2020-06-21 ENCOUNTER — Other Ambulatory Visit: Payer: Self-pay | Admitting: Internal Medicine

## 2020-06-30 ENCOUNTER — Other Ambulatory Visit: Payer: Self-pay

## 2020-06-30 MED ORDER — EZETIMIBE 10 MG PO TABS: 10.0000 mg | ORAL_TABLET | Freq: Every day | ORAL | 3 refills | Status: DC

## 2020-07-12 ENCOUNTER — Encounter: Payer: Self-pay | Admitting: Pharmacist Clinician (PhC)/ Clinical Pharmacy Specialist

## 2020-07-12 ENCOUNTER — Ambulatory Visit (INDEPENDENT_AMBULATORY_CARE_PROVIDER_SITE_OTHER): Payer: Medicare Other | Admitting: Pharmacist Clinician (PhC)/ Clinical Pharmacy Specialist

## 2020-07-12 ENCOUNTER — Other Ambulatory Visit: Payer: Self-pay

## 2020-07-12 DIAGNOSIS — E7800 Pure hypercholesterolemia, unspecified: Secondary | ICD-10-CM | POA: Diagnosis not present

## 2020-07-12 NOTE — Patient Instructions (Addendum)
Your Results:             Your most recent labs Goal  Total Cholesterol 226 < 200  Triglycerides 230 < 150  HDL (happy/good cholesterol) 36 > 40  LDL (lousy/bad cholesterol 148 < 100   Medication changes:  Once we get your labs done, I will call you with those results.  We may increase the rosuvastatin if the LDL is still above 100.  Lab orders:  Go to the lab today for cholesterol and liver tests  Thank you for choosing [facility]
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
## 2020-07-12 NOTE — Assessment & Plan Note (Signed)
Patient with hyperlipidemia and non-obstructive ASCVD, currently on rosuvastatin 10 mg and ezetimibe 10 mg.  She has not had any cholesterol labs drawn since starting this regimen last winter.  Will have her repeat cholesterol labs today and then determine the benefit.  If necessary we can increase the rosuvastatin dose, as she is tolerating this well.

## 2020-07-13 LAB — HEPATIC FUNCTION PANEL
ALT: 46 IU/L — ABNORMAL HIGH (ref 0–32)
AST: 62 IU/L — ABNORMAL HIGH (ref 0–40)
Albumin: 4.4 g/dL (ref 3.6–4.6)
Alkaline Phosphatase: 113 IU/L (ref 44–121)
Bilirubin Total: 0.4 mg/dL (ref 0.0–1.2)
Bilirubin, Direct: 0.12 mg/dL (ref 0.00–0.40)
Total Protein: 8.2 g/dL (ref 6.0–8.5)

## 2020-07-13 LAB — LIPID PANEL
Chol/HDL Ratio: 5.3 ratio — ABNORMAL HIGH (ref 0.0–4.4)
Cholesterol, Total: 207 mg/dL — ABNORMAL HIGH (ref 100–199)
HDL: 39 mg/dL — ABNORMAL LOW (ref >39)
LDL Chol Calc (NIH): 119 mg/dL — ABNORMAL HIGH (ref 0–99)
Triglycerides: 277 mg/dL — ABNORMAL HIGH (ref 0–149)
VLDL Cholesterol Cal: 49 mg/dL — ABNORMAL HIGH (ref 5–40)

## 2020-07-29 ENCOUNTER — Telehealth: Payer: Self-pay | Admitting: *Deleted

## 2020-07-29 MED ORDER — ELIQUIS 5 MG PO TABS: 5.0000 mg | ORAL_TABLET | Freq: Two times a day (BID) | ORAL | 3 refills | Status: DC

## 2020-07-29 NOTE — Telephone Encounter (Signed)
Patient assistance forms completed, will fax when Dr Oval Linsey signs

## 2020-08-20 ENCOUNTER — Telehealth: Payer: Self-pay

## 2020-08-20 NOTE — Telephone Encounter (Signed)
The patient tried to send a transmission but was unsuccessful. I called [employer] with the patient and they are sending her a new wirex for her monitor.

## 2020-09-07 ENCOUNTER — Encounter: Payer: Self-pay | Admitting: Internal Medicine

## 2020-09-07 ENCOUNTER — Ambulatory Visit (INDEPENDENT_AMBULATORY_CARE_PROVIDER_SITE_OTHER): Payer: Medicare Other | Admitting: Internal Medicine

## 2020-09-07 ENCOUNTER — Other Ambulatory Visit: Payer: Self-pay

## 2020-09-07 VITALS — BP 138/70 | HR 99 | Temp 98.0°F | Ht 62.0 in | Wt 253.0 lb

## 2020-09-07 DIAGNOSIS — R0609 Other forms of dyspnea: Secondary | ICD-10-CM

## 2020-09-07 DIAGNOSIS — I5032 Chronic diastolic (congestive) heart failure: Secondary | ICD-10-CM

## 2020-09-07 DIAGNOSIS — E118 Type 2 diabetes mellitus with unspecified complications: Secondary | ICD-10-CM | POA: Diagnosis not present

## 2020-09-07 DIAGNOSIS — Z952 Presence of prosthetic heart valve: Secondary | ICD-10-CM

## 2020-09-07 DIAGNOSIS — G8929 Other chronic pain: Secondary | ICD-10-CM

## 2020-09-07 DIAGNOSIS — I4892 Unspecified atrial flutter: Secondary | ICD-10-CM

## 2020-09-07 DIAGNOSIS — M545 Low back pain, unspecified: Secondary | ICD-10-CM | POA: Diagnosis not present

## 2020-09-07 DIAGNOSIS — R0600 Dyspnea, unspecified: Secondary | ICD-10-CM | POA: Diagnosis not present

## 2020-09-07 DIAGNOSIS — Z23 Encounter for immunization: Secondary | ICD-10-CM

## 2020-09-07 DIAGNOSIS — I4891 Unspecified atrial fibrillation: Secondary | ICD-10-CM

## 2020-09-07 LAB — COMPREHENSIVE METABOLIC PANEL
ALT: 44 U/L — ABNORMAL HIGH (ref 0–35)
AST: 52 U/L — ABNORMAL HIGH (ref 0–37)
Albumin: 4.3 g/dL (ref 3.5–5.2)
Alkaline Phosphatase: 96 U/L (ref 39–117)
BUN: 19 mg/dL (ref 6–23)
CO2: 22 mEq/L (ref 19–32)
Calcium: 10.9 mg/dL — ABNORMAL HIGH (ref 8.4–10.5)
Chloride: 98 mEq/L (ref 96–112)
Creatinine, Ser: 1.01 mg/dL (ref 0.40–1.20)
GFR: 50.95 mL/min — ABNORMAL LOW (ref >60.00)
Glucose, Bld: 241 mg/dL — ABNORMAL HIGH (ref 70–99)
Potassium: 4.3 mEq/L (ref 3.5–5.1)
Sodium: 139 mEq/L (ref 135–145)
Total Bilirubin: 0.6 mg/dL (ref 0.2–1.2)
Total Protein: 8.7 g/dL — ABNORMAL HIGH (ref 6.0–8.3)

## 2020-09-07 LAB — CBC WITH DIFFERENTIAL/PLATELET
Basophils Absolute: 0 10*3/uL (ref 0.0–0.1)
Basophils Relative: 0.2 % (ref 0.0–3.0)
Eosinophils Absolute: 0.1 10*3/uL (ref 0.0–0.7)
Eosinophils Relative: 1.5 % (ref 0.0–5.0)
HCT: 42.1 % (ref 36.0–46.0)
Hemoglobin: 14.4 g/dL (ref 12.0–15.0)
Lymphocytes Relative: 42.2 % (ref 12.0–46.0)
Lymphs Abs: 2.8 10*3/uL (ref 0.7–4.0)
MCHC: 34.1 g/dL (ref 30.0–36.0)
MCV: 92.3 fl (ref 78.0–100.0)
Monocytes Absolute: 0.6 10*3/uL (ref 0.1–1.0)
Monocytes Relative: 9.6 % (ref 3.0–12.0)
Neutro Abs: 3 10*3/uL (ref 1.4–7.7)
Neutrophils Relative %: 46.5 % (ref 43.0–77.0)
Platelets: 275 10*3/uL (ref 150.0–400.0)
RBC: 4.56 Mil/uL (ref 3.87–5.11)
RDW: 13.2 % (ref 11.5–15.5)
WBC: 6.5 10*3/uL (ref 4.0–10.5)

## 2020-09-07 LAB — HEMOGLOBIN A1C: Hgb A1c MFr Bld: 10.3 % — ABNORMAL HIGH (ref 4.6–6.5)

## 2020-09-07 NOTE — Assessment & Plan Note (Signed)
On Metformin, Prandin Check glu, A1c

## 2020-09-07 NOTE — Assessment & Plan Note (Signed)
Feeling better Still SOB

## 2020-09-07 NOTE — Assessment & Plan Note (Signed)
Norco  5/325 -- - not taking

## 2020-09-07 NOTE — Assessment & Plan Note (Signed)
Cont w/diet 

## 2020-09-07 NOTE — Assessment & Plan Note (Signed)
On Toprol, Eliquis

## 2020-09-07 NOTE — Assessment & Plan Note (Addendum)
Multifactorial. Wt loss should help

## 2020-09-07 NOTE — Progress Notes (Signed)
Subjective:  Patient ID: Jessica [name], female    DOB: Feb 11, 1934  Age: 87 y.o. MRN: [id number]  CC: Follow-up (3 month f/u)   HPI Jessica [name] presents for HTN, CAD, CHF, dyslipidemia f/u C/o L earache x weeks or months off and on  Outpatient Medications Prior to Visit  Medication Sig Dispense Refill  . allopurinol (ZYLOPRIM) 100 MG tablet Take 1 tablet (100 mg total) by mouth daily. 90 tablet 3  . apixaban (ELIQUIS) 5 MG TABS tablet Take 1 tablet (5 mg total) by mouth 2 (two) times daily. 180 tablet 3  . b complex vitamins tablet Take 1 tablet by mouth daily.    . Blood Glucose Calibration (OT ULTRA/FASTTK CNTRL SOLN) SOLN Use as directed 1 each 0  . Blood Glucose Monitoring Suppl (ONE TOUCH ULTRA 2) w/Device KIT Use as instructed to check blood sugar BID 1 kit 0  . Cholecalciferol 1000 UNITS tablet Take 1,000 Units by mouth daily.    [name] ezetimibe (ZETIA) 10 MG tablet Take 1 tablet (10 mg total) by mouth daily. 90 tablet 3  . furosemide (LASIX) 40 MG tablet Take 1 tablet (40 mg total) by mouth 2 (two) times daily. 180 tablet 3  . glucose blood (ONETOUCH ULTRA) test strip Use as instructed to check blood sugar BID 200 each 3  . Lancets (ONETOUCH ULTRASOFT) lancets Use as instructed to check blood sugar BID 200 each 3  . levothyroxine (SYNTHROID) 75 MCG tablet Take 1 tablet (75 mcg total) by mouth daily. 90 tablet 3  . LORazepam (ATIVAN) 0.5 MG tablet Take 1 tablet (0.5 mg total) by mouth 2 (two) times daily as needed for anxiety. 180 tablet 1  . metFORMIN (GLUCOPHAGE) 500 MG tablet Take 1 tablet (500 mg total) by mouth 2 (two) times daily with Jessica meal. 180 tablet 3  . metoprolol succinate (TOPROL-XL) 50 MG 24 hr tablet TAKE 1 TABLET (50 MG TOTAL) BY MOUTH DAILY. TAKE WITH OR IMMEDIATELY FOLLOWING Jessica MEAL. 90 tablet 3  . Multiple Vitamin (MULTIVITAMIN WITH MINERALS) TABS tablet Take 1 tablet by mouth daily. Women's Centrum    . Multiple Vitamins-Minerals (PRESERVISION AREDS 2 PO)  Take 1 tablet by mouth 2 (two) times daily.    Jessica [name] Glycol-Propyl Glycol (LUBRICANT EYE DROPS) 0.4-0.3 % SOLN Place 1 drop into both eyes 4 (four) times daily as needed (dry/irritated eyes.).    [name] potassium chloride SA (KLOR-CON M20) 20 MEQ tablet Take 1 tablet (20 mEq total) by mouth daily. 90 tablet 3  . repaglinide (PRANDIN) 2 MG tablet Take 1 tablet (2 mg total) by mouth 3 (three) times daily before meals. 270 tablet 3  . rosuvastatin (CRESTOR) 10 MG tablet Take 1 tablet by mouth up to 3 times per week as tolerated 36 tablet 3  . sacubitril-valsartan (ENTRESTO) 97-103 MG Take 1 tablet by mouth 2 (two) times daily. 60 tablet 11  . temazepam (RESTORIL) 15 MG capsule TAKE 1 CAPSULE BY MOUTH AT BEDTIME AS NEEDED FOR SLEEP 30 capsule 3  . triamcinolone ointment (KENALOG) 0.5 % Apply 1 application topically 3 (three) times daily. 60 g 1  . venlafaxine XR (EFFEXOR-XR) 75 MG 24 hr capsule TAKE 1 CAPSULE EVERY DAY WITH BREAKFAST 90 capsule 3   No facility-administered medications prior to visit.    ROS: Review of Systems  Constitutional: Positive for fatigue. Negative for activity change, appetite change, chills and unexpected weight change.  HENT: Negative for congestion, mouth sores and sinus pressure.  Eyes: Negative for visual disturbance.  Respiratory: Positive for shortness of breath. Negative for cough and chest tightness.   Cardiovascular: Positive for leg swelling. Negative for chest pain.  Gastrointestinal: Negative for abdominal pain and nausea.  Genitourinary: Negative for difficulty urinating, frequency and vaginal pain.  Musculoskeletal: Positive for arthralgias, back pain and gait problem.  Skin: Negative for pallor and rash.  Neurological: Positive for weakness. Negative for dizziness, tremors, numbness and headaches.  Hematological: Bruises/bleeds easily.  Psychiatric/Behavioral: Positive for decreased concentration and dysphoric mood. Negative for confusion, sleep  disturbance and suicidal ideas. The patient is nervous/anxious.     Objective:  BP 138/70 (BP Location: Left Arm)   Pulse 99   Temp 98 F (36.7 C) (Oral)   Ht '5\' 2"'  (1.575 m)   Wt 253 lb (114.8 kg)   SpO2 96%   BMI 46.27 kg/m   BP Readings from Last 3 Encounters:  09/07/20 138/70  06/15/20 118/72  06/09/20 130/82    Wt Readings from Last 3 Encounters:  09/07/20 253 lb (114.8 kg)  06/15/20 252 lb 12.8 oz (114.7 kg)  06/09/20 252 lb 9.6 oz (114.6 kg)    Physical Exam Constitutional:      General: She is not in acute distress.    Appearance: She is well-developed. She is obese.  HENT:     Head: Normocephalic.     Right Ear: External ear normal.     Left Ear: External ear normal.     Nose: Nose normal.  Eyes:     General:        Right eye: No discharge.        Left eye: No discharge.     Conjunctiva/sclera: Conjunctivae normal.     Pupils: Pupils are equal, round, and reactive to light.  Neck:     Thyroid: No thyromegaly.     Vascular: No JVD.     Trachea: No tracheal deviation.  Cardiovascular:     Rate and Rhythm: Normal rate and regular rhythm.     Heart sounds: Normal heart sounds.  Pulmonary:     Effort: No respiratory distress.     Breath sounds: No stridor. No wheezing.  Abdominal:     General: Bowel sounds are normal. There is no distension.     Palpations: Abdomen is soft. There is no mass.     Tenderness: There is no abdominal tenderness. There is no guarding or rebound.  Musculoskeletal:        General: Tenderness present.     Cervical back: Normal range of motion and neck supple.  Lymphadenopathy:     Cervical: No cervical adenopathy.  Skin:    Findings: No erythema or rash.  Neurological:     Mental Status: She is oriented to person, place, and time.     Cranial Nerves: No cranial nerve deficit.     Motor: No abnormal muscle tone.     Coordination: Coordination abnormal.     Gait: Gait abnormal.     Deep Tendon Reflexes: Reflexes normal.   Psychiatric:        Behavior: Behavior normal.        Thought Content: Thought content normal.        Judgment: Judgment normal.   LS spine is tender Using Jessica cane  Lab Results  Component Value Date   WBC 6.7 12/10/2019   HGB 14.4 12/10/2019   HCT 42.8 12/10/2019   PLT 259 12/10/2019   GLUCOSE 283 (H) 06/09/2020   CHOL  207 (H) 07/12/2020   TRIG 277 (H) 07/12/2020   HDL 39 (L) 07/12/2020   LDLDIRECT 155.9 09/30/2012   LDLCALC 119 (H) 07/12/2020   ALT 46 (H) 07/12/2020   AST 62 (H) 07/12/2020   NA 138 06/09/2020   K 4.1 06/09/2020   CL 98 06/09/2020   CREATININE 0.93 06/09/2020   BUN 12 06/09/2020   CO2 24 06/09/2020   TSH 2.46 06/09/2020   INR 1.2 01/10/2019   HGBA1C 9.3 (H) 06/09/2020   MICROALBUR 4.5 (H) 12/02/2009    CT Chest Wo Contrast  Result Date: 07/09/2019 CLINICAL DATA:  Follow-up of inferior lingular nodule. EXAM: CT CHEST WITHOUT CONTRAST TECHNIQUE: Multidetector CT imaging of the chest was performed following the standard protocol without IV contrast. COMPARISON:  CTA of the chest of 01/07/2019. FINDINGS: Cardiovascular: Aortic atherosclerosis. Aortic valve repair. Tortuous thoracic aorta. Mild cardiomegaly, without pericardial effusion. Pacer. Lad coronary artery calcification. Pulmonary artery enlargement, outflow tract 3.1 cm Mediastinum/Nodes: No mediastinal or definite hilar adenopathy, given limitations of unenhanced CT. Moderate hiatal hernia. Lungs/Pleura: No pleural fluid. Lingular scarring. Lingular nodule of 7 mm on 72/3 is similar on the prior exam and back on 10/07/2008, considered benign. Posterior left upper lobe calcified granulomas. Upper Abdomen: Hepatic steatosis. Normal imaged portions of the spleen, pancreas, adrenal glands, left kidney. Musculoskeletal: Advanced lower thoracic spondylosis. Eccentric right T12 vertebral hemangioma. IMPRESSION: 1. Similar lingular nodule, including back to 10/07/2008. This can be considered benign. 2. Pulmonary  artery enlargement suggests pulmonary arterial hypertension. 3. Moderate hiatal hernia. 4. Hepatic steatosis. 5. Coronary artery atherosclerosis. Aortic Atherosclerosis (ICD10-I70.0). Electronically Signed   By: Abigail Miyamoto M.D.   On: 07/09/2019 12:40    Assessment & Plan:    Follow-up: No follow-ups on file.  Walker Kehr, MD

## 2020-09-07 NOTE — Assessment & Plan Note (Signed)
Triamt/HCTZ, Losartan, Entresto, Jardiance Check CMET, CBC

## 2020-09-08 ENCOUNTER — Other Ambulatory Visit: Payer: Self-pay | Admitting: Internal Medicine

## 2020-09-08 MED ORDER — REPAGLINIDE 2 MG PO TABS: 4.0000 mg | ORAL_TABLET | Freq: Three times a day (TID) | ORAL | 3 refills | Status: DC

## 2020-09-09 ENCOUNTER — Other Ambulatory Visit: Payer: Self-pay | Admitting: *Deleted

## 2020-09-09 LAB — URINALYSIS, ROUTINE W REFLEX MICROSCOPIC
Bilirubin Urine: NEGATIVE
Hgb urine dipstick: NEGATIVE
Ketones, ur: NEGATIVE
Nitrite: NEGATIVE
RBC / HPF: NONE SEEN (ref 0–?)
Specific Gravity, Urine: 1.02 (ref 1.000–1.030)
Total Protein, Urine: NEGATIVE
Urine Glucose: NEGATIVE
Urobilinogen, UA: 0.2 (ref 0.0–1.0)
pH: 5.5 (ref 5.0–8.0)

## 2020-09-09 MED ORDER — METOPROLOL SUCCINATE ER 50 MG PO TB24: 50.0000 mg | ORAL_TABLET | Freq: Every day | ORAL | 3 refills | Status: DC

## 2020-09-24 NOTE — Telephone Encounter (Signed)
Spoke with patient and she has been approved through end of year

## 2020-10-01 ENCOUNTER — Ambulatory Visit: Payer: Self-pay

## 2020-10-01 NOTE — Chronic Care Management (AMB) (Signed)
  Care Management  Care Management Phone Note  10/01/2020 Name: Jessica [name] MRN: [id number] DOB: 02/09/1934 Jessica [name] is a 87 y.o. year old female who is a primary care patient of Plotnikov, Evie Lacks, MD.   The Care Management team was consulted to assess patient's needs after disconnection with services provided by [facility].  Successful outreach was made by telephone today to assess patient care needs post discontinuation of [facility] Services.   Patient confirmed receipt of in home care service discontinuation letter:   Plan: The patient denied current care management/care coordination needs and declined to enroll in care management/care coordination services. Please use [id number] referral order to refer the patient to the embedded care coordination team if the patient is in need of care management/care coordination needs in the future.    Tomasa Rand, RN, BSN, CEN [facility] RN Case Manager [phone]

## 2020-10-21 ENCOUNTER — Other Ambulatory Visit: Payer: Self-pay | Admitting: Internal Medicine

## 2020-10-25 ENCOUNTER — Other Ambulatory Visit: Payer: Self-pay

## 2020-10-25 NOTE — Telephone Encounter (Signed)
Patient called back to check on the status of the refill. Stressed that she is completely out and she hasn't slept in 2 days.

## 2020-10-25 NOTE — Addendum Note (Signed)
Addended by: Earnstine Regal on: 10/25/2020 04:32 PM   Modules accepted: Orders

## 2020-10-25 NOTE — Telephone Encounter (Signed)
pt has stated she is in need of her sleeping medication as she has not slept in a few days and is completely out of her meds.  **Pt states the pharmacy stated they tried to reach Dr. Alain Marion on Thursday in regards to the note on file about the below medication.  **temazepam (RESTORIL) 15 MG capsule

## 2020-10-26 ENCOUNTER — Telehealth: Payer: Self-pay

## 2020-10-26 MED ORDER — TEMAZEPAM 15 MG PO CAPS: ORAL_CAPSULE | ORAL | 3 refills | Status: DC

## 2020-10-26 NOTE — Telephone Encounter (Signed)
   Daughter Stanton Kidney calling to check status of refill

## 2020-10-26 NOTE — Addendum Note (Signed)
Addended by: [name] on: 10/26/2020 11:28 PM   Modules accepted: Orders

## 2020-10-26 NOTE — Telephone Encounter (Signed)
Ok Thx 

## 2020-10-26 NOTE — Telephone Encounter (Signed)
pt has called stating she is in need of her sleep medication temazepam (RESTORIL) 15 MG capsule. She has states she has reched out trying to get her medication since Thursday. Pt was last seen on 09/07/2020. Pt is very upset and has stated she really needs her medication as she has not slept in days.

## 2020-10-27 ENCOUNTER — Telehealth: Payer: Self-pay | Admitting: Internal Medicine

## 2020-10-27 NOTE — Telephone Encounter (Signed)
Notified daughter per chart Temazepam was approved and sent back yesterday 7/26 to [facility]. Daughter states the have received a call from the pharmacy, bu she was calling because mom has been out of her medication since last Thursday. She states mom call on Friday, and was told he was not n the office. She states she called 3 times on Monday, and left a msg for Dr. Alain Marion to give her a call, but he did nt call. Inform daughter not sure if MD received the msg, and gave her the refill protocol 24-72 hours on refills. Daughter states she is wanting to speak w/ Dr. Alain Marion. Inform her I will send hm the msg../lmb  temazepam (RESTORIL) 15 MG capsule 30 capsule 3 10/26/2020    Sig: 1 po qhs prn   Sent to pharmacy as: temazepam (RESTORIL) 15 MG capsule   E-Prescribing Status: Receipt confirmed by pharmacy (10/26/2020 11:28 PM EDT)

## 2020-10-27 NOTE — Telephone Encounter (Signed)
Patient daughter called yesterday to check status of refill.. states she was told that she would receive call after 5pm from Dr. Camila Li but never received call.   Would like a call back: Jessica [name] [phone]

## 2020-10-28 NOTE — Telephone Encounter (Signed)
I called - spoke w/Mary

## 2020-11-10 ENCOUNTER — Telehealth: Payer: Self-pay

## 2020-11-10 NOTE — Telephone Encounter (Signed)
Patient called in to let us know she has finally received her monitor and she is going to plug it in and let handheld charge and then send a remote transmission. Once we get the remote transmission in we can adjust her schedule

## 2020-11-12 ENCOUNTER — Ambulatory Visit (INDEPENDENT_AMBULATORY_CARE_PROVIDER_SITE_OTHER): Payer: Medicare Other

## 2020-11-12 DIAGNOSIS — I442 Atrioventricular block, complete: Secondary | ICD-10-CM | POA: Diagnosis not present

## 2020-11-12 LAB — CUP PACEART REMOTE DEVICE CHECK
Battery Impedance: 959 Ohm
Battery Remaining Longevity: 62 mo
Battery Voltage: 2.78 V
Brady Statistic AP VP Percent: 11 %
Brady Statistic AP VS Percent: 0 %
Brady Statistic AS VP Percent: 89 %
Brady Statistic AS VS Percent: 0 %
Date Time Interrogation Session: 20220811165203
Implantable Lead Implant Date: 20140710
Implantable Lead Implant Date: 20140710
Implantable Lead Location: 753859
Implantable Lead Location: 753860
Implantable Lead Model: 5092
Implantable Lead Model: 5592
Implantable Pulse Generator Implant Date: 20140710
Lead Channel Impedance Value: 461 Ohm
Lead Channel Impedance Value: 691 Ohm
Lead Channel Pacing Threshold Amplitude: 0.5 V
Lead Channel Pacing Threshold Amplitude: 0.5 V
Lead Channel Pacing Threshold Pulse Width: 0.4 ms
Lead Channel Pacing Threshold Pulse Width: 0.4 ms
Lead Channel Setting Pacing Amplitude: 2 V
Lead Channel Setting Pacing Amplitude: 2.5 V
Lead Channel Setting Pacing Pulse Width: 0.4 ms
Lead Channel Setting Sensing Sensitivity: 4 mV

## 2020-11-12 NOTE — Telephone Encounter (Signed)
Patient device is working and has sent a transmission successfully LVM to let patient know

## 2020-11-22 ENCOUNTER — Other Ambulatory Visit: Payer: Self-pay

## 2020-11-22 MED ORDER — REPAGLINIDE 2 MG PO TABS: 4.0000 mg | ORAL_TABLET | Freq: Three times a day (TID) | ORAL | 3 refills | Status: DC

## 2020-12-02 NOTE — Progress Notes (Signed)
Remote pacemaker transmission.   

## 2020-12-07 ENCOUNTER — Other Ambulatory Visit: Payer: Self-pay

## 2020-12-07 DIAGNOSIS — E118 Type 2 diabetes mellitus with unspecified complications: Secondary | ICD-10-CM

## 2020-12-07 MED ORDER — REPAGLINIDE 2 MG PO TABS: 4.0000 mg | ORAL_TABLET | Freq: Three times a day (TID) | ORAL | 3 refills | Status: DC

## 2020-12-08 ENCOUNTER — Ambulatory Visit: Payer: Medicare Other | Admitting: Internal Medicine

## 2020-12-21 ENCOUNTER — Other Ambulatory Visit: Payer: Self-pay

## 2020-12-21 ENCOUNTER — Ambulatory Visit (INDEPENDENT_AMBULATORY_CARE_PROVIDER_SITE_OTHER): Payer: Medicare Other | Admitting: Internal Medicine

## 2020-12-21 ENCOUNTER — Encounter: Payer: Self-pay | Admitting: Internal Medicine

## 2020-12-21 VITALS — BP 120/62 | HR 85 | Temp 98.4°F

## 2020-12-21 DIAGNOSIS — E118 Type 2 diabetes mellitus with unspecified complications: Secondary | ICD-10-CM | POA: Diagnosis not present

## 2020-12-21 DIAGNOSIS — R600 Localized edema: Secondary | ICD-10-CM | POA: Diagnosis not present

## 2020-12-21 DIAGNOSIS — Z23 Encounter for immunization: Secondary | ICD-10-CM | POA: Diagnosis not present

## 2020-12-21 DIAGNOSIS — W19XXXA Unspecified fall, initial encounter: Secondary | ICD-10-CM

## 2020-12-21 DIAGNOSIS — S8001XA Contusion of right knee, initial encounter: Secondary | ICD-10-CM | POA: Diagnosis not present

## 2020-12-21 DIAGNOSIS — I10 Essential (primary) hypertension: Secondary | ICD-10-CM

## 2020-12-21 MED ORDER — ASPIRIN EC 81 MG PO TBEC: 81.0000 mg | DELAYED_RELEASE_TABLET | Freq: Every day | ORAL | 3 refills | Status: AC

## 2020-12-21 NOTE — Progress Notes (Signed)
Subjective:  Patient ID: Jessica [name], female    DOB: [birth year]  Age: 87 y.o. MRN: [id number]  CC: Follow-up (3 month f/u- Flu shot) and Knee Pain (Pt states she fell on Sunday hurt (R) knee)   HPI [name] presents for R knee pain and swelling  Pt fell on her knees on Sun am after she tripped.  F/u DM, HTN Pt can keep wt on it In a w/c Not on Eliquis - stopped Rx on her own a a while ago due to cost and general difficulties in getting it.  Not taking aspirin   Outpatient Medications Prior to Visit  Medication Sig Dispense Refill   allopurinol (ZYLOPRIM) 100 MG tablet Take 1 tablet (100 mg total) by mouth daily. 90 tablet 3   apixaban (ELIQUIS) 5 MG TABS tablet Take 1 tablet (5 mg total) by mouth 2 (two) times daily. 180 tablet 3   b complex vitamins tablet Take 1 tablet by mouth daily.     Blood Glucose Calibration (OT ULTRA/FASTTK CNTRL SOLN) SOLN Use as directed 1 each 0   Blood Glucose Monitoring Suppl (ONE TOUCH ULTRA 2) w/Device KIT Use as instructed to check blood sugar BID 1 kit 0   Cholecalciferol 1000 UNITS tablet Take 1,000 Units by mouth daily.     furosemide (LASIX) 40 MG tablet Take 1 tablet (40 mg total) by mouth 2 (two) times daily. 180 tablet 3   glucose blood (ONETOUCH ULTRA) test strip Use as instructed to check blood sugar BID 200 each 3   Lancets (ONETOUCH ULTRASOFT) lancets Use as instructed to check blood sugar BID 200 each 3   levothyroxine (SYNTHROID) 75 MCG tablet Take 1 tablet (75 mcg total) by mouth daily. 90 tablet 3   LORazepam (ATIVAN) 0.5 MG tablet Take 1 tablet (0.5 mg total) by mouth 2 (two) times daily as needed for anxiety. 180 tablet 1   metFORMIN (GLUCOPHAGE) 500 MG tablet Take 1 tablet (500 mg total) by mouth 2 (two) times daily with a meal. 180 tablet 3   metoprolol succinate (TOPROL-XL) 50 MG 24 hr tablet Take 1 tablet (50 mg total) by mouth daily. Take with or immediately following a meal. 90 tablet 3   Multiple Vitamin  (MULTIVITAMIN WITH MINERALS) TABS tablet Take 1 tablet by mouth daily. Women's Centrum     Multiple Vitamins-Minerals (PRESERVISION AREDS 2 PO) Take 1 tablet by mouth 2 (two) times daily.     Polyethyl Glycol-Propyl Glycol (LUBRICANT EYE DROPS) 0.4-0.3 % SOLN Place 1 drop into both eyes 4 (four) times daily as needed (dry/irritated eyes.).     potassium chloride SA (KLOR-CON M20) 20 MEQ tablet Take 1 tablet (20 mEq total) by mouth daily. 90 tablet 3   repaglinide (PRANDIN) 2 MG tablet Take 2 tablets (4 mg total) by mouth 3 (three) times daily before meals. 540 tablet 3   rosuvastatin (CRESTOR) 10 MG tablet Take 1 tablet by mouth up to 3 times per week as tolerated 36 tablet 3   sacubitril-valsartan (ENTRESTO) 97-103 MG Take 1 tablet by mouth 2 (two) times daily. 60 tablet 11   temazepam (RESTORIL) 15 MG capsule 1 po qhs prn 30 capsule 3   venlafaxine XR (EFFEXOR-XR) 75 MG 24 hr capsule TAKE 1 CAPSULE EVERY DAY WITH BREAKFAST 90 capsule 3   ezetimibe (ZETIA) 10 MG tablet Take 1 tablet (10 mg total) by mouth daily. 90 tablet 3   No facility-administered medications prior to visit.    ROS:  Review of Systems  Constitutional:  Negative for activity change, appetite change, chills, fatigue and unexpected weight change.  HENT:  Negative for congestion, mouth sores and sinus pressure.   Eyes:  Negative for visual disturbance.  Respiratory:  Negative for cough and chest tightness.   Gastrointestinal:  Negative for abdominal pain and nausea.  Genitourinary:  Negative for difficulty urinating, frequency and vaginal pain.  Musculoskeletal:  Positive for arthralgias, back pain and gait problem.  Skin:  Negative for pallor and rash.  Neurological:  Negative for dizziness, tremors, weakness, numbness and headaches.  Psychiatric/Behavioral:  Negative for confusion, sleep disturbance and suicidal ideas. The patient is nervous/anxious.    Objective:  BP 120/62 (BP Location: Left Arm)   Pulse 85   Temp  98.4 F (36.9 C) (Oral)   SpO2 95%   BP Readings from Last 3 Encounters:  12/21/20 120/62  09/07/20 138/70  06/15/20 118/72    Wt Readings from Last 3 Encounters:  09/07/20 253 lb (114.8 kg)  06/15/20 252 lb 12.8 oz (114.7 kg)  06/09/20 252 lb 9.6 oz (114.6 kg)    Physical Exam Constitutional:      General: She is not in acute distress.    Appearance: She is well-developed. She is obese.  HENT:     Head: Normocephalic.     Right Ear: External ear normal.     Left Ear: External ear normal.     Nose: Nose normal.  Eyes:     General:        Right eye: No discharge.        Left eye: No discharge.     Conjunctiva/sclera: Conjunctivae normal.     Pupils: Pupils are equal, round, and reactive to light.  Neck:     Thyroid: No thyromegaly.     Vascular: No JVD.     Trachea: No tracheal deviation.  Cardiovascular:     Rate and Rhythm: Normal rate and regular rhythm.     Heart sounds: Normal heart sounds.  Pulmonary:     Effort: No respiratory distress.     Breath sounds: No stridor. No wheezing.  Abdominal:     General: Bowel sounds are normal. There is no distension.     Palpations: Abdomen is soft. There is no mass.     Tenderness: There is no abdominal tenderness. There is no guarding or rebound.  Musculoskeletal:        General: Tenderness present.     Cervical back: Normal range of motion and neck supple. No rigidity.  Lymphadenopathy:     Cervical: No cervical adenopathy.  Skin:    Findings: No erythema or rash.  Neurological:     Mental Status: She is oriented to person, place, and time.     Cranial Nerves: No cranial nerve deficit.     Motor: No abnormal muscle tone.     Coordination: Coordination abnormal.     Gait: Gait abnormal.     Deep Tendon Reflexes: Reflexes normal.  Psychiatric:        Behavior: Behavior normal.        Thought Content: Thought content normal.        Judgment: Judgment normal.   R foot more swollen on the R Pain w/ROM Using a  cane; in a w/c now  Lab Results  Component Value Date   WBC 6.5 09/07/2020   HGB 14.4 09/07/2020   HCT 42.1 09/07/2020   PLT 275.0 09/07/2020   GLUCOSE 241 (H) 09/07/2020  CHOL 207 (H) 07/12/2020   TRIG 277 (H) 07/12/2020   HDL 39 (L) 07/12/2020   LDLDIRECT 155.9 09/30/2012   LDLCALC 119 (H) 07/12/2020   ALT 44 (H) 09/07/2020   AST 52 (H) 09/07/2020   NA 139 09/07/2020   K 4.3 09/07/2020   CL 98 09/07/2020   CREATININE 1.01 09/07/2020   BUN 19 09/07/2020   CO2 22 09/07/2020   TSH 2.46 06/09/2020   INR 1.2 01/10/2019   HGBA1C 10.3 (H) 09/07/2020   MICROALBUR 4.5 (H) 12/02/2009    CT Chest Wo Contrast  Result Date: 07/09/2019 CLINICAL DATA:  Follow-up of inferior lingular nodule. EXAM: CT CHEST WITHOUT CONTRAST TECHNIQUE: Multidetector CT imaging of the chest was performed following the standard protocol without IV contrast. COMPARISON:  CTA of the chest of 01/07/2019. FINDINGS: Cardiovascular: Aortic atherosclerosis. Aortic valve repair. Tortuous thoracic aorta. Mild cardiomegaly, without pericardial effusion. Pacer. Lad coronary artery calcification. Pulmonary artery enlargement, outflow tract 3.1 cm Mediastinum/Nodes: No mediastinal or definite hilar adenopathy, given limitations of unenhanced CT. Moderate hiatal hernia. Lungs/Pleura: No pleural fluid. Lingular scarring. Lingular nodule of 7 mm on 72/3 is similar on the prior exam and back on 10/07/2008, considered benign. Posterior left upper lobe calcified granulomas. Upper Abdomen: Hepatic steatosis. Normal imaged portions of the spleen, pancreas, adrenal glands, left kidney. Musculoskeletal: Advanced lower thoracic spondylosis. Eccentric right T12 vertebral hemangioma. IMPRESSION: 1. Similar lingular nodule, including back to 10/07/2008. This can be considered benign. 2. Pulmonary artery enlargement suggests pulmonary arterial hypertension. 3. Moderate hiatal hernia. 4. Hepatic steatosis. 5. Coronary artery atherosclerosis. Aortic  Atherosclerosis (ICD10-I70.0). Electronically Signed   By: Abigail Miyamoto M.D.   On: 07/09/2019 12:40    Assessment & Plan:   Problem List Items Addressed This Visit   None Visit Diagnoses     Needs flu shot    -  Primary   Relevant Orders   Flu Vaccine QUAD High Dose(Fluad) (Completed)         Follow-up: No follow-ups on file.  Walker Kehr, MD

## 2020-12-22 DIAGNOSIS — S8000XA Contusion of unspecified knee, initial encounter: Secondary | ICD-10-CM | POA: Insufficient documentation

## 2020-12-22 DIAGNOSIS — W19XXXA Unspecified fall, initial encounter: Secondary | ICD-10-CM | POA: Insufficient documentation

## 2020-12-22 LAB — COMPREHENSIVE METABOLIC PANEL
ALT: 53 U/L — ABNORMAL HIGH (ref 0–35)
AST: 77 U/L — ABNORMAL HIGH (ref 0–37)
Albumin: 3.9 g/dL (ref 3.5–5.2)
Alkaline Phosphatase: 78 U/L (ref 39–117)
BUN: 16 mg/dL (ref 6–23)
CO2: 26 mEq/L (ref 19–32)
Calcium: 10.1 mg/dL (ref 8.4–10.5)
Chloride: 99 mEq/L (ref 96–112)
Creatinine, Ser: 0.92 mg/dL (ref 0.40–1.20)
GFR: 56.88 mL/min — ABNORMAL LOW (ref >60.00)
Glucose, Bld: 189 mg/dL — ABNORMAL HIGH (ref 70–99)
Potassium: 3.7 mEq/L (ref 3.5–5.1)
Sodium: 136 mEq/L (ref 135–145)
Total Bilirubin: 0.6 mg/dL (ref 0.2–1.2)
Total Protein: 8.1 g/dL (ref 6.0–8.3)

## 2020-12-22 LAB — TSH: TSH: 2.08 u[IU]/mL (ref 0.35–5.50)

## 2020-12-22 LAB — HEMOGLOBIN A1C: Hgb A1c MFr Bld: 9.5 % — ABNORMAL HIGH (ref 4.6–6.5)

## 2020-12-22 NOTE — Assessment & Plan Note (Signed)
12/2008 recent fall at home.  No loss of consciousness.  Use a walker/cane.  Fall prevention discussed

## 2020-12-22 NOTE — Assessment & Plan Note (Signed)
Diet discussed again

## 2020-12-22 NOTE — Assessment & Plan Note (Signed)
9/22 right knee.  The patient declined orthopedic surgery consultation, knee x-ray, knee arthrocentesis Apply ice.  Fall prevention discussed.  Come back if problems

## 2021-01-21 ENCOUNTER — Encounter: Payer: Medicare Other | Admitting: Internal Medicine

## 2021-01-21 DIAGNOSIS — I442 Atrioventricular block, complete: Secondary | ICD-10-CM

## 2021-01-28 ENCOUNTER — Ambulatory Visit (INDEPENDENT_AMBULATORY_CARE_PROVIDER_SITE_OTHER): Payer: Medicare Other | Admitting: Cardiovascular Disease

## 2021-01-28 ENCOUNTER — Encounter (HOSPITAL_BASED_OUTPATIENT_CLINIC_OR_DEPARTMENT_OTHER): Payer: Self-pay | Admitting: Cardiovascular Disease

## 2021-01-28 VITALS — BP 130/71 | HR 82 | Ht 62.0 in | Wt 247.0 lb

## 2021-01-28 DIAGNOSIS — E7800 Pure hypercholesterolemia, unspecified: Secondary | ICD-10-CM | POA: Diagnosis not present

## 2021-01-28 DIAGNOSIS — I4891 Unspecified atrial fibrillation: Secondary | ICD-10-CM

## 2021-01-28 DIAGNOSIS — I5032 Chronic diastolic (congestive) heart failure: Secondary | ICD-10-CM

## 2021-01-28 DIAGNOSIS — Z5181 Encounter for therapeutic drug level monitoring: Secondary | ICD-10-CM | POA: Diagnosis not present

## 2021-01-28 DIAGNOSIS — I10 Essential (primary) hypertension: Secondary | ICD-10-CM

## 2021-01-28 DIAGNOSIS — I4892 Unspecified atrial flutter: Secondary | ICD-10-CM

## 2021-01-28 HISTORY — DX: Pure hypercholesterolemia, unspecified: E78.00

## 2021-01-28 MED ORDER — ROSUVASTATIN CALCIUM 10 MG PO TABS
ORAL_TABLET | ORAL | 3 refills | Status: DC
Start: 2021-01-28 — End: 2021-06-09

## 2021-01-28 MED ORDER — EZETIMIBE 10 MG PO TABS: 10.0000 mg | ORAL_TABLET | Freq: Every day | ORAL | 3 refills | Status: DC

## 2021-01-28 NOTE — Patient Instructions (Addendum)
Medication Instructions:  RESTART ROSUVASTATIN 10 MG THREE TIMES A WEEK    RESTART YOUR EZETIMIBE 10 MG DAILY   *If you need a refill on your cardiac medications before your next appointment, please call your pharmacy*  Lab Work: FASTING LP/CMET IN 2-3 MONTHS   If you have labs (blood work) drawn today and your tests are completely normal, you will receive your results only by: [place] (if you have MyChart) OR A paper copy in the mail If you have any lab test that is abnormal or we need to change your treatment, we will call you to review the results.  Testing/Procedures: NONE   Follow-Up: At [facility], you and your health needs are our priority.  As part of our continuing mission to provide you with exceptional heart care, we have created designated Provider Care Teams.  These Care Teams include your primary Cardiologist (physician) and Advanced Practice Providers (APPs -  Physician Assistants and Nurse Practitioners) who all work together to provide you with the care you need, when you need it.  We recommend signing up for the patient portal called "MyChart".  Sign up information is provided on this After Visit Summary.  MyChart is used to connect with patients for Virtual Visits (Telemedicine).  Patients are able to view lab/test results, encounter notes, upcoming appointments, etc.  Non-urgent messages can be sent to your provider as well.   To learn more about what you can do with MyChart, go to [email or web address].    Your next appointment:   6 month(s)  The format for your next appointment:   In Person  Provider:   Skeet Latch, MD

## 2021-01-28 NOTE — Addendum Note (Signed)
Addended by: Alvina Filbert B on: 01/28/2021 01:17 PM   Modules accepted: Orders
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
## 2021-02-11 ENCOUNTER — Ambulatory Visit (INDEPENDENT_AMBULATORY_CARE_PROVIDER_SITE_OTHER): Payer: Medicare Other

## 2021-02-11 DIAGNOSIS — I442 Atrioventricular block, complete: Secondary | ICD-10-CM | POA: Diagnosis not present

## 2021-02-11 LAB — CUP PACEART REMOTE DEVICE CHECK
Battery Impedance: 1091 Ohm
Battery Remaining Longevity: 58 mo
Battery Voltage: 2.78 V
Brady Statistic AP VP Percent: 12 %
Brady Statistic AP VS Percent: 0 %
Brady Statistic AS VP Percent: 88 %
Brady Statistic AS VS Percent: 0 %
Date Time Interrogation Session: 20221111130058
Implantable Lead Implant Date: 20140710
Implantable Lead Implant Date: 20140710
Implantable Lead Location: 753859
Implantable Lead Location: 753860
Implantable Lead Model: 5092
Implantable Lead Model: 5592
Implantable Pulse Generator Implant Date: 20140710
Lead Channel Impedance Value: 542 Ohm
Lead Channel Impedance Value: 723 Ohm
Lead Channel Pacing Threshold Amplitude: 0.5 V
Lead Channel Pacing Threshold Amplitude: 0.625 V
Lead Channel Pacing Threshold Pulse Width: 0.4 ms
Lead Channel Pacing Threshold Pulse Width: 0.4 ms
Lead Channel Setting Pacing Amplitude: 2 V
Lead Channel Setting Pacing Amplitude: 2.5 V
Lead Channel Setting Pacing Pulse Width: 0.4 ms
Lead Channel Setting Sensing Sensitivity: 4 mV

## 2021-02-18 ENCOUNTER — Encounter: Payer: Medicare Other | Admitting: Internal Medicine

## 2021-02-21 NOTE — Progress Notes (Signed)
Remote pacemaker transmission.   

## 2021-02-22 ENCOUNTER — Other Ambulatory Visit: Payer: Self-pay | Admitting: Internal Medicine

## 2021-02-22 NOTE — Telephone Encounter (Signed)
Check [place] registry last filled 01/26/2021. MD out of the office unil Dec 1st. Pls advise on refill.Marland KitchenJohny [name]

## 2021-02-23 ENCOUNTER — Encounter: Payer: Medicare Other | Admitting: Internal Medicine

## 2021-03-15 ENCOUNTER — Other Ambulatory Visit: Payer: Self-pay | Admitting: Internal Medicine

## 2021-03-22 ENCOUNTER — Encounter: Payer: Self-pay | Admitting: Internal Medicine

## 2021-03-22 ENCOUNTER — Ambulatory Visit (INDEPENDENT_AMBULATORY_CARE_PROVIDER_SITE_OTHER): Payer: Medicare Other | Admitting: Internal Medicine

## 2021-03-22 ENCOUNTER — Other Ambulatory Visit: Payer: Self-pay

## 2021-03-22 DIAGNOSIS — M159 Polyosteoarthritis, unspecified: Secondary | ICD-10-CM

## 2021-03-22 DIAGNOSIS — E118 Type 2 diabetes mellitus with unspecified complications: Secondary | ICD-10-CM

## 2021-03-22 DIAGNOSIS — I5032 Chronic diastolic (congestive) heart failure: Secondary | ICD-10-CM

## 2021-03-22 LAB — COMPREHENSIVE METABOLIC PANEL
ALT: 35 U/L (ref 0–35)
AST: 44 U/L — ABNORMAL HIGH (ref 0–37)
Albumin: 3.9 g/dL (ref 3.5–5.2)
Alkaline Phosphatase: 91 U/L (ref 39–117)
BUN: 24 mg/dL — ABNORMAL HIGH (ref 6–23)
CO2: 24 mEq/L (ref 19–32)
Calcium: 10.6 mg/dL — ABNORMAL HIGH (ref 8.4–10.5)
Chloride: 99 mEq/L (ref 96–112)
Creatinine, Ser: 0.94 mg/dL (ref 0.40–1.20)
GFR: 55.33 mL/min — ABNORMAL LOW (ref >60.00)
Glucose, Bld: 195 mg/dL — ABNORMAL HIGH (ref 70–99)
Potassium: 4.2 mEq/L (ref 3.5–5.1)
Sodium: 136 mEq/L (ref 135–145)
Total Bilirubin: 0.5 mg/dL (ref 0.2–1.2)
Total Protein: 8.1 g/dL (ref 6.0–8.3)

## 2021-03-22 LAB — HEMOGLOBIN A1C: Hgb A1c MFr Bld: 9 % — ABNORMAL HIGH (ref 4.6–6.5)

## 2021-03-22 MED ORDER — TEMAZEPAM 15 MG PO CAPS: ORAL_CAPSULE | ORAL | 5 refills | Status: DC

## 2021-03-22 NOTE — Assessment & Plan Note (Signed)
Check A1c. 

## 2021-03-22 NOTE — Progress Notes (Signed)
Subjective:  Patient ID: Jessica [name], female    DOB: 09-06-1933  Age: 87 y.o. MRN: [id number]  CC: Follow-up (3 month f/u)   HPI Jessica [name] presents for CHF, DM, gout C/o depression - worse SOB w/a mask on  Outpatient Medications Prior to Visit  Medication Sig Dispense Refill   allopurinol (ZYLOPRIM) 100 MG tablet Take 1 tablet (100 mg total) by mouth daily. 90 tablet 3   aspirin EC 81 MG tablet Take 1 tablet (81 mg total) by mouth daily. 100 tablet 3   b complex vitamins tablet Take 1 tablet by mouth daily.     Blood Glucose Calibration (OT ULTRA/FASTTK CNTRL SOLN) SOLN Use as directed 1 each 0   Blood Glucose Monitoring Suppl (ONE TOUCH ULTRA 2) w/Device KIT Use as instructed to check blood sugar BID 1 kit 0   Cholecalciferol 1000 UNITS tablet Take 1,000 Units by mouth daily.     ezetimibe (ZETIA) 10 MG tablet Take 1 tablet (10 mg total) by mouth daily. 90 tablet 3   furosemide (LASIX) 40 MG tablet Take 1 tablet (40 mg total) by mouth 2 (two) times daily. 180 tablet 3   glucose blood (ONETOUCH ULTRA) test strip Use as instructed to check blood sugar BID 200 each 3   Lancets (ONETOUCH ULTRASOFT) lancets Use as instructed to check blood sugar BID 200 each 3   levothyroxine (SYNTHROID) 75 MCG tablet Take 1 tablet (75 mcg total) by mouth daily. 90 tablet 3   LORazepam (ATIVAN) 0.5 MG tablet TAKE 1 TABLET BY MOUTH  TWICE DAILY AS NEEDED FOR  ANXIETY 180 tablet 1   metFORMIN (GLUCOPHAGE) 500 MG tablet Take 1 tablet (500 mg total) by mouth 2 (two) times daily with a meal. 180 tablet 3   metoprolol succinate (TOPROL-XL) 50 MG 24 hr tablet Take 1 tablet (50 mg total) by mouth daily. Take with or immediately following a meal. 90 tablet 3   Multiple Vitamin (MULTIVITAMIN WITH MINERALS) TABS tablet Take 1 tablet by mouth daily. Women's Centrum     Multiple Vitamins-Minerals (PRESERVISION AREDS 2 PO) Take 1 tablet by mouth 2 (two) times daily.     Polyethyl Glycol-Propyl Glycol  (LUBRICANT EYE DROPS) 0.4-0.3 % SOLN Place 1 drop into both eyes 4 (four) times daily as needed (dry/irritated eyes.).     potassium chloride SA (KLOR-CON M20) 20 MEQ tablet Take 1 tablet (20 mEq total) by mouth daily. 90 tablet 3   repaglinide (PRANDIN) 2 MG tablet Take 2 tablets (4 mg total) by mouth 3 (three) times daily before meals. 540 tablet 3   rosuvastatin (CRESTOR) 10 MG tablet Take 1 tablet by mouth up to 3 times per week as tolerated 36 tablet 3   sacubitril-valsartan (ENTRESTO) 97-103 MG Take 1 tablet by mouth 2 (two) times daily. 60 tablet 11   venlafaxine XR (EFFEXOR-XR) 75 MG 24 hr capsule TAKE 1 CAPSULE EVERY DAY WITH BREAKFAST 90 capsule 3   temazepam (RESTORIL) 15 MG capsule TAKE 1 CAPSULE BY MOUTH EVERY NIGHT AT BEDTIME AS NEEDED 30 capsule 0   No facility-administered medications prior to visit.    ROS: Review of Systems  Constitutional:  Positive for fatigue. Negative for activity change, appetite change, chills and unexpected weight change.  HENT:  Negative for congestion, mouth sores and sinus pressure.   Eyes:  Negative for visual disturbance.  Respiratory:  Positive for shortness of breath. Negative for cough and chest tightness.   Gastrointestinal:  Negative for abdominal  pain and nausea.  Genitourinary:  Negative for difficulty urinating, frequency and vaginal pain.  Musculoskeletal:  Positive for arthralgias, back pain and gait problem.  Skin:  Negative for pallor and rash.  Neurological:  Positive for weakness. Negative for dizziness, tremors, numbness and headaches.  Psychiatric/Behavioral:  Positive for dysphoric mood and sleep disturbance. Negative for confusion and decreased concentration. The patient is nervous/anxious.    Objective:  BP (!) 120/58 (BP Location: Left Arm)    Pulse (!) 107    Temp 98.9 F (37.2 C) (Oral)    Ht '5\' 2"'  (1.575 m)    Wt 251 lb 6.4 oz (114 kg)    SpO2 95%    BMI 45.98 kg/m   BP Readings from Last 3 Encounters:  03/22/21 (!)  120/58  01/28/21 130/71  12/21/20 120/62    Wt Readings from Last 3 Encounters:  03/22/21 251 lb 6.4 oz (114 kg)  01/28/21 247 lb (112 kg)  09/07/20 253 lb (114.8 kg)    Physical Exam  Lab Results  Component Value Date   WBC 6.5 09/07/2020   HGB 14.4 09/07/2020   HCT 42.1 09/07/2020   PLT 275.0 09/07/2020   GLUCOSE 189 (H) 12/21/2020   CHOL 207 (H) 07/12/2020   TRIG 277 (H) 07/12/2020   HDL 39 (L) 07/12/2020   LDLDIRECT 155.9 09/30/2012   LDLCALC 119 (H) 07/12/2020   ALT 53 (H) 12/21/2020   AST 77 (H) 12/21/2020   NA 136 12/21/2020   K 3.7 12/21/2020   CL 99 12/21/2020   CREATININE 0.92 12/21/2020   BUN 16 12/21/2020   CO2 26 12/21/2020   TSH 2.08 12/21/2020   INR 1.2 01/10/2019   HGBA1C 9.5 (H) 12/21/2020   MICROALBUR 4.5 (H) 12/02/2009    CT Chest Wo Contrast  Result Date: 07/09/2019 CLINICAL DATA:  Follow-up of inferior lingular nodule. EXAM: CT CHEST WITHOUT CONTRAST TECHNIQUE: Multidetector CT imaging of the chest was performed following the standard protocol without IV contrast. COMPARISON:  CTA of the chest of 01/07/2019. FINDINGS: Cardiovascular: Aortic atherosclerosis. Aortic valve repair. Tortuous thoracic aorta. Mild cardiomegaly, without pericardial effusion. Pacer. Lad coronary artery calcification. Pulmonary artery enlargement, outflow tract 3.1 cm Mediastinum/Nodes: No mediastinal or definite hilar adenopathy, given limitations of unenhanced CT. Moderate hiatal hernia. Lungs/Pleura: No pleural fluid. Lingular scarring. Lingular nodule of 7 mm on 72/3 is similar on the prior exam and back on 10/07/2008, considered benign. Posterior left upper lobe calcified granulomas. Upper Abdomen: Hepatic steatosis. Normal imaged portions of the spleen, pancreas, adrenal glands, left kidney. Musculoskeletal: Advanced lower thoracic spondylosis. Eccentric right T12 vertebral hemangioma. IMPRESSION: 1. Similar lingular nodule, including back to 10/07/2008. This can be considered  benign. 2. Pulmonary artery enlargement suggests pulmonary arterial hypertension. 3. Moderate hiatal hernia. 4. Hepatic steatosis. 5. Coronary artery atherosclerosis. Aortic Atherosclerosis (ICD10-I70.0). Electronically Signed   By: Abigail Miyamoto M.D.   On: 07/09/2019 12:40    Assessment & Plan:   Problem List Items Addressed This Visit     Chronic diastolic heart failure ([place])    S/p AV nodal ablation.  Triamt/HCTZ, Losartan, Entresto, Jardiance      Relevant Orders   Comprehensive metabolic panel   DM type 2, controlled, with complication (HCC)    Check A1c      Relevant Orders   Hemoglobin A1c   OBESITY, MORBID    Diet discussed      Osteoarthritis    Use Voltaren gel on finger 4 times a day  Meds ordered this encounter  Medications   temazepam (RESTORIL) 15 MG capsule    Sig: TAKE 1 CAPSULE BY MOUTH EVERY NIGHT AT BEDTIME AS NEEDED    Dispense:  30 capsule    Refill:  5      Follow-up: No follow-ups on file.  Walker Kehr, MD

## 2021-03-22 NOTE — Assessment & Plan Note (Signed)
Diet discussed 

## 2021-03-22 NOTE — Patient Instructions (Signed)
Use Voltaren gel on finger 4 times a day

## 2021-03-22 NOTE — Assessment & Plan Note (Signed)
Use Voltaren gel on finger 4 times a day

## 2021-03-22 NOTE — Assessment & Plan Note (Signed)
S/p AV nodal ablation.  Triamt/HCTZ, Losartan, Delene Loll, Jardiance

## 2021-03-29 ENCOUNTER — Ambulatory Visit (INDEPENDENT_AMBULATORY_CARE_PROVIDER_SITE_OTHER): Payer: Medicare Other | Admitting: Internal Medicine

## 2021-03-29 ENCOUNTER — Other Ambulatory Visit: Payer: Self-pay

## 2021-03-29 ENCOUNTER — Encounter: Payer: Self-pay | Admitting: Internal Medicine

## 2021-03-29 VITALS — BP 110/56 | HR 100 | Ht 62.0 in | Wt 251.0 lb

## 2021-03-29 DIAGNOSIS — I4892 Unspecified atrial flutter: Secondary | ICD-10-CM | POA: Diagnosis not present

## 2021-03-29 DIAGNOSIS — I10 Essential (primary) hypertension: Secondary | ICD-10-CM | POA: Diagnosis not present

## 2021-03-29 DIAGNOSIS — Z952 Presence of prosthetic heart valve: Secondary | ICD-10-CM

## 2021-03-29 DIAGNOSIS — I442 Atrioventricular block, complete: Secondary | ICD-10-CM | POA: Diagnosis not present

## 2021-03-29 DIAGNOSIS — I428 Other cardiomyopathies: Secondary | ICD-10-CM

## 2021-03-29 DIAGNOSIS — I4891 Unspecified atrial fibrillation: Secondary | ICD-10-CM

## 2021-03-29 LAB — CUP PACEART INCLINIC DEVICE CHECK
Battery Impedance: 1118 Ohm
Battery Remaining Longevity: 58 mo
Battery Voltage: 2.77 V
Brady Statistic AP VP Percent: 12 %
Brady Statistic AP VS Percent: 0 %
Brady Statistic AS VP Percent: 88 %
Brady Statistic AS VS Percent: 0 %
Date Time Interrogation Session: 20221227124333
Implantable Lead Implant Date: 20140710
Implantable Lead Implant Date: 20140710
Implantable Lead Location: 753859
Implantable Lead Location: 753860
Implantable Lead Model: 5092
Implantable Lead Model: 5592
Implantable Pulse Generator Implant Date: 20140710
Lead Channel Impedance Value: 455 Ohm
Lead Channel Impedance Value: 833 Ohm
Lead Channel Pacing Threshold Amplitude: 0.5 V
Lead Channel Pacing Threshold Amplitude: 0.5 V
Lead Channel Pacing Threshold Amplitude: 0.5 V
Lead Channel Pacing Threshold Amplitude: 0.5 V
Lead Channel Pacing Threshold Pulse Width: 0.4 ms
Lead Channel Pacing Threshold Pulse Width: 0.4 ms
Lead Channel Pacing Threshold Pulse Width: 0.4 ms
Lead Channel Pacing Threshold Pulse Width: 0.4 ms
Lead Channel Sensing Intrinsic Amplitude: 0.7 mV
Lead Channel Setting Pacing Amplitude: 2 V
Lead Channel Setting Pacing Amplitude: 2.5 V
Lead Channel Setting Pacing Pulse Width: 0.4 ms
Lead Channel Setting Sensing Sensitivity: 4 mV

## 2021-03-29 NOTE — Addendum Note (Signed)
Addended by: Saddie Benders E on: 03/29/2021 12:16 PM   Modules accepted: Orders
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
## 2021-03-29 NOTE — Patient Instructions (Addendum)
Medication Instructions:  Your physician recommends that you continue on your current medications as directed. Please refer to the Current Medication list given to you today. *If you need a refill on your cardiac medications before your next appointment, please call your pharmacy*  Lab Work: None. If you have labs (blood work) drawn today and your tests are completely normal, you will receive your results only by: [place] (if you have MyChart) OR A paper copy in the mail If you have any lab test that is abnormal or we need to change your treatment, we will call you to review the results.  Testing/Procedures: None.  Follow-Up: At [facility], you and your health needs are our priority.  As part of our continuing mission to provide you with exceptional heart care, we have created designated Provider Care Teams.  These Care Teams include your primary Cardiologist (physician) and Advanced Practice Providers (APPs -  Physician Assistants and Nurse Practitioners) who all work together to provide you with the care you need, when you need it.  Your physician wants you to follow-up in: with Dr. Quentin Ore for a watchman consult.   We recommend signing up for the patient portal called "MyChart".  Sign up information is provided on this After Visit Summary.  MyChart is used to connect with patients for Virtual Visits (Telemedicine).  Patients are able to view lab/test results, encounter notes, upcoming appointments, etc.  Non-urgent messages can be sent to your provider as well.   To learn more about what you can do with MyChart, go to [email or web address].    Any Other Special Instructions Will Be Listed Below (If Applicable).

## 2021-04-18 ENCOUNTER — Telehealth: Payer: Self-pay

## 2021-04-18 DIAGNOSIS — K921 Melena: Secondary | ICD-10-CM

## 2021-04-18 NOTE — Telephone Encounter (Signed)
Pt calling in wanting a referral for a Colonoscopy.  Pt has been seeing blood while wiping, denies pain started a couple of weeks ago.

## 2021-04-19 NOTE — Telephone Encounter (Signed)
Called pt there was no answer LMOM order has been placed they will call you to set up the appt.Marland KitchenJohny [name]

## 2021-04-19 NOTE — Telephone Encounter (Signed)
The referral was placed.  Thanks

## 2021-04-22 ENCOUNTER — Telehealth (HOSPITAL_BASED_OUTPATIENT_CLINIC_OR_DEPARTMENT_OTHER): Payer: Self-pay | Admitting: *Deleted

## 2021-04-22 MED ORDER — ENTRESTO 97-103 MG PO TABS: 1.0000 | ORAL_TABLET | Freq: Two times a day (BID) | ORAL | 3 refills | Status: DC

## 2021-04-22 NOTE — Telephone Encounter (Signed)
Patient assistance papers filled out, will fax once Dr Oval Linsey has signed

## 2021-05-01 ENCOUNTER — Other Ambulatory Visit: Payer: Self-pay | Admitting: Internal Medicine

## 2021-05-02 ENCOUNTER — Encounter: Payer: Self-pay | Admitting: Gastroenterology

## 2021-05-10 ENCOUNTER — Ambulatory Visit: Payer: Medicare Other | Admitting: Cardiology

## 2021-05-10 ENCOUNTER — Telehealth (HOSPITAL_BASED_OUTPATIENT_CLINIC_OR_DEPARTMENT_OTHER): Payer: Self-pay | Admitting: Cardiovascular Disease

## 2021-05-10 MED ORDER — ENTRESTO 97-103 MG PO TABS: 1.0000 | ORAL_TABLET | Freq: Two times a day (BID) | ORAL | 1 refills | Status: DC

## 2021-05-10 NOTE — Telephone Encounter (Signed)
Received fax from [facility] requesting refills for Entresto 97-103 mg. Rx request sent to pharmacy.

## 2021-05-24 ENCOUNTER — Telehealth: Payer: Self-pay | Admitting: Internal Medicine

## 2021-05-24 ENCOUNTER — Ambulatory Visit (INDEPENDENT_AMBULATORY_CARE_PROVIDER_SITE_OTHER): Payer: Medicare Other

## 2021-05-24 ENCOUNTER — Telehealth: Payer: Self-pay

## 2021-05-24 DIAGNOSIS — I442 Atrioventricular block, complete: Secondary | ICD-10-CM

## 2021-05-24 NOTE — Telephone Encounter (Signed)
The patient called because she received a letter stating we did not get a transmission. I helped her send one. Transmission received. I put her on the schedule for today and told her the next time she is to send a transmission. The patient verbalized understanding and thanked me for the call.

## 2021-05-24 NOTE — Telephone Encounter (Signed)
See phone note in epic.

## 2021-05-24 NOTE — Telephone Encounter (Signed)
Patient called stating she received a letter stating a transmission was not received. She states she sent on the 10th of February.

## 2021-05-25 LAB — CUP PACEART REMOTE DEVICE CHECK
Battery Impedance: 1199 Ohm
Battery Remaining Longevity: 54 mo
Battery Voltage: 2.78 V
Brady Statistic AP VP Percent: 20 %
Brady Statistic AP VS Percent: 0 %
Brady Statistic AS VP Percent: 80 %
Brady Statistic AS VS Percent: 0 %
Date Time Interrogation Session: 20230221134704
Implantable Lead Implant Date: 20140710
Implantable Lead Implant Date: 20140710
Implantable Lead Location: 753859
Implantable Lead Location: 753860
Implantable Lead Model: 5092
Implantable Lead Model: 5592
Implantable Pulse Generator Implant Date: 20140710
Lead Channel Impedance Value: 448 Ohm
Lead Channel Impedance Value: 739 Ohm
Lead Channel Pacing Threshold Amplitude: 0.375 V
Lead Channel Pacing Threshold Amplitude: 0.5 V
Lead Channel Pacing Threshold Pulse Width: 0.4 ms
Lead Channel Pacing Threshold Pulse Width: 0.4 ms
Lead Channel Setting Pacing Amplitude: 2 V
Lead Channel Setting Pacing Amplitude: 2.5 V
Lead Channel Setting Pacing Pulse Width: 0.4 ms
Lead Channel Setting Sensing Sensitivity: 4 mV

## 2021-05-31 NOTE — Progress Notes (Signed)
Remote pacemaker transmission.   

## 2021-06-01 ENCOUNTER — Encounter: Payer: Self-pay | Admitting: Gastroenterology

## 2021-06-01 ENCOUNTER — Ambulatory Visit: Payer: Medicare Other | Admitting: Gastroenterology

## 2021-06-01 VITALS — BP 94/62 | HR 103 | Ht 62.0 in | Wt 258.0 lb

## 2021-06-01 DIAGNOSIS — K625 Hemorrhage of anus and rectum: Secondary | ICD-10-CM | POA: Diagnosis not present

## 2021-06-01 MED ORDER — PRAMOXINE-HC 1-1 % EX CREA: TOPICAL_CREAM | Freq: Two times a day (BID) | CUTANEOUS | 1 refills | Status: DC

## 2021-06-01 NOTE — Patient Instructions (Addendum)
You have been scheduled for a colonoscopy. Please follow written instructions given to you at your visit today.  ?Please pick up your prep supplies at the pharmacy within the next 1-3 days. ?If you use inhalers (even only as needed), please bring them with you on the day of your procedure.  ? ?You have been scheduled at [facility] on 07/25/2021 at 8:45am for your procedure, separate instructions have been given ? ?We have sent the following medications to your pharmacy for you to pick up at your convenience: Analpram Cream  ? ?Follow up after colonoscopy as needed ? ?Due to recent changes in healthcare laws, you may see the results of your imaging and laboratory studies on MyChart before your provider has had a chance to review them.  We understand that in some cases there may be results that are confusing or concerning to you. Not all laboratory results come back in the same time frame and the provider may be waiting for multiple results in order to interpret others.  Please give [place] 48 hours in order for your provider to thoroughly review all the results before contacting the office for clarification of your results.   ? ?If you are age 30 or older, your body mass index should be between 23-30. Your Body mass index is 47.19 kg/m?[name] If this is out of the aforementioned range listed, please consider follow up with your Primary Care Provider. ? ?If you are age 26 or younger, your body mass index should be between 19-25. Your Body mass index is 47.19 kg/m?[name] If this is out of the aformentioned range listed, please consider follow up with your Primary Care Provider.  ? ?________________________________________________________ ? ?The [facility] providers would like to encourage you to use [facility] to communicate with providers for non-urgent requests or questions.  Due to long hold times on the telephone, sending your provider a message by [facility] may be a faster and more efficient way to get a response.  Please allow 48 business  hours for a response.  Please remember that this is for non-urgent requests.  ?_______________________________________________________  ? ?Thank you for choosing [facility] ? ?Kavitha Nandigam,MD  ?
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
## 2021-06-06 ENCOUNTER — Telehealth: Payer: Self-pay | Admitting: Internal Medicine

## 2021-06-06 NOTE — Telephone Encounter (Signed)
1.Medication Requested: allopurinol (ZYLOPRIM) 100 MG tablet ? ?furosemide (LASIX) 40 MG tablet ? ?levothyroxine (SYNTHROID) 75 MCG tablet ? ? ? ?2. Pharmacy (Name, [place], [facility]): [facility] ([facility] ) - [address] [place], [place] ? ?3. On Med List: y ? ?4. Last Visit with PCP: 03-22-2021 ? ?5. Next visit date with PCP: 06-22-2021 ? ? ?Agent: Please be advised that RX refills may take up to 3 business days. We ask that you follow-up with your pharmacy.  ?

## 2021-06-09 ENCOUNTER — Other Ambulatory Visit (HOSPITAL_BASED_OUTPATIENT_CLINIC_OR_DEPARTMENT_OTHER): Payer: Self-pay | Admitting: *Deleted

## 2021-06-09 MED ORDER — ROSUVASTATIN CALCIUM 10 MG PO TABS: ORAL_TABLET | ORAL | 2 refills | Status: DC

## 2021-06-09 NOTE — Telephone Encounter (Signed)
Rx(s) sent to pharmacy electronically.  

## 2021-06-22 ENCOUNTER — Other Ambulatory Visit: Payer: Self-pay

## 2021-06-22 ENCOUNTER — Encounter: Payer: Self-pay | Admitting: Internal Medicine

## 2021-06-22 ENCOUNTER — Ambulatory Visit (INDEPENDENT_AMBULATORY_CARE_PROVIDER_SITE_OTHER): Payer: Medicare Other | Admitting: Internal Medicine

## 2021-06-22 DIAGNOSIS — E118 Type 2 diabetes mellitus with unspecified complications: Secondary | ICD-10-CM | POA: Diagnosis not present

## 2021-06-22 DIAGNOSIS — I4892 Unspecified atrial flutter: Secondary | ICD-10-CM

## 2021-06-22 DIAGNOSIS — I4891 Unspecified atrial fibrillation: Secondary | ICD-10-CM | POA: Diagnosis not present

## 2021-06-22 DIAGNOSIS — I10 Essential (primary) hypertension: Secondary | ICD-10-CM | POA: Diagnosis not present

## 2021-06-22 DIAGNOSIS — I5032 Chronic diastolic (congestive) heart failure: Secondary | ICD-10-CM | POA: Diagnosis not present

## 2021-06-22 DIAGNOSIS — E039 Hypothyroidism, unspecified: Secondary | ICD-10-CM | POA: Diagnosis not present

## 2021-06-22 DIAGNOSIS — F419 Anxiety disorder, unspecified: Secondary | ICD-10-CM

## 2021-06-22 LAB — COMPREHENSIVE METABOLIC PANEL
ALT: 23 U/L (ref 0–35)
AST: 35 U/L (ref 0–37)
Albumin: 4.1 g/dL (ref 3.5–5.2)
Alkaline Phosphatase: 76 U/L (ref 39–117)
BUN: 19 mg/dL (ref 6–23)
CO2: 27 mEq/L (ref 19–32)
Calcium: 9.9 mg/dL (ref 8.4–10.5)
Chloride: 99 mEq/L (ref 96–112)
Creatinine, Ser: 0.9 mg/dL (ref 0.40–1.20)
GFR: 58.19 mL/min — ABNORMAL LOW (ref >60.00)
Glucose, Bld: 183 mg/dL — ABNORMAL HIGH (ref 70–99)
Potassium: 4 mEq/L (ref 3.5–5.1)
Sodium: 136 mEq/L (ref 135–145)
Total Bilirubin: 0.6 mg/dL (ref 0.2–1.2)
Total Protein: 8.1 g/dL (ref 6.0–8.3)

## 2021-06-22 LAB — HEMOGLOBIN A1C: Hgb A1c MFr Bld: 9.4 % — ABNORMAL HIGH (ref 4.6–6.5)

## 2021-06-22 NOTE — Assessment & Plan Note (Addendum)
Probably worse.  Continue on Metformin, Prandin ?We considered Rybelsus, but it is too $$$. ?The patient is very inactive. ?Cont w/wt loss effort ?

## 2021-06-22 NOTE — Assessment & Plan Note (Addendum)
Better ?Wt Readings from Last 3 Encounters:  ?06/22/21 249 lb 2 oz (113 kg)  ?06/01/21 258 lb (117 kg)  ?03/29/21 251 lb (113.9 kg)  ?Continue on a better diet.  She will try to be a little more active walking ? ?

## 2021-06-22 NOTE — Assessment & Plan Note (Addendum)
Chronic.  Continue on Toprol, Eliquis ?

## 2021-06-22 NOTE — Assessment & Plan Note (Addendum)
Chronic.  Continue on Triamt/HCTZ, Losartan ?

## 2021-06-22 NOTE — Progress Notes (Signed)
? ?Subjective:  ?Patient ID: Jessica [name], female    DOB: 08-Jan-1934  Age: 88 y.o. MRN: [id number] ? ?CC: Follow-up ? ? ?HPI ?Jessica [name] presents for CHF, DM, HTN, obesity ? ?Outpatient Medications Prior to Visit  ?Medication Sig Dispense Refill  ? allopurinol (ZYLOPRIM) 100 MG tablet TAKE 1 TABLET BY MOUTH  DAILY 90 tablet 3  ? aspirin EC 81 MG tablet Take 1 tablet (81 mg total) by mouth daily. 100 tablet 3  ? b complex vitamins tablet Take 1 tablet by mouth daily.    ? Blood Glucose Calibration (OT ULTRA/FASTTK CNTRL SOLN) SOLN Use as directed 1 each 0  ? Blood Glucose Monitoring Suppl (ONE TOUCH ULTRA 2) w/Device KIT Use as instructed to check blood sugar BID 1 kit 0  ? Cholecalciferol 1000 UNITS tablet Take 1,000 Units by mouth daily.    ? furosemide (LASIX) 40 MG tablet TAKE 1 TABLET BY MOUTH  TWICE DAILY 180 tablet 3  ? glucose blood (ONETOUCH ULTRA) test strip Use as instructed to check blood sugar BID 200 each 3  ? Lancets (ONETOUCH ULTRASOFT) lancets Use as instructed to check blood sugar BID 200 each 3  ? levothyroxine (SYNTHROID) 75 MCG tablet TAKE 1 TABLET BY MOUTH  DAILY 90 tablet 3  ? LORazepam (ATIVAN) 0.5 MG tablet TAKE 1 TABLET BY MOUTH  TWICE DAILY AS NEEDED FOR  ANXIETY 180 tablet 1  ? metFORMIN (GLUCOPHAGE) 500 MG tablet TAKE 1 TABLET BY MOUTH  TWICE DAILY WITH A MEAL 180 tablet 3  ? metoprolol succinate (TOPROL-XL) 50 MG 24 hr tablet Take 1 tablet (50 mg total) by mouth daily. Take with or immediately following a meal. 90 tablet 3  ? Multiple Vitamin (MULTIVITAMIN WITH MINERALS) TABS tablet Take 1 tablet by mouth daily. Women's Centrum    ? Multiple Vitamins-Minerals (PRESERVISION AREDS 2 PO) Take 1 tablet by mouth 2 (two) times daily.    ? Polyethyl Glycol-Propyl Glycol (LUBRICANT EYE DROPS) 0.4-0.3 % SOLN Place 1 drop into both eyes 4 (four) times daily as needed (dry/irritated eyes.).    ? potassium chloride SA (KLOR-CON M) 20 MEQ tablet TAKE 1 TABLET BY MOUTH  DAILY 90 tablet 3   ? pramoxine-hydrocortisone (ANALPRAM HC) cream Apply topically in the morning and at bedtime. Apply pea sized amount per rectum for 7 days 30 g 1  ? repaglinide (PRANDIN) 2 MG tablet Take 2 tablets (4 mg total) by mouth 3 (three) times daily before meals. 540 tablet 3  ? rosuvastatin (CRESTOR) 10 MG tablet Take 1 tablet by mouth up to 3 times per week as tolerated 36 tablet 2  ? sacubitril-valsartan (ENTRESTO) 97-103 MG Take 1 tablet by mouth 2 (two) times daily. 180 tablet 1  ? temazepam (RESTORIL) 15 MG capsule TAKE 1 CAPSULE BY MOUTH EVERY NIGHT AT BEDTIME AS NEEDED 30 capsule 5  ? venlafaxine XR (EFFEXOR-XR) 75 MG 24 hr capsule TAKE 1 CAPSULE BY MOUTH  DAILY WITH BREAKFAST 90 capsule 3  ? ezetimibe (ZETIA) 10 MG tablet Take 1 tablet (10 mg total) by mouth daily. 90 tablet 3  ? ?No facility-administered medications prior to visit.  ? ? ?ROS: ?Review of Systems  ?Constitutional:  Positive for unexpected weight change. Negative for activity change, appetite change, chills and fatigue.  ?HENT:  Negative for congestion, mouth sores and sinus pressure.   ?Eyes:  Negative for visual disturbance.  ?Respiratory:  Positive for shortness of breath. Negative for cough, chest tightness and wheezing.   ?  Cardiovascular:  Positive for leg swelling.  ?Gastrointestinal:  Negative for abdominal pain and nausea.  ?Genitourinary:  Negative for difficulty urinating, frequency and vaginal pain.  ?Musculoskeletal:  Positive for back pain and gait problem.  ?Skin:  Negative for pallor and rash.  ?Neurological:  Positive for weakness. Negative for dizziness, tremors, numbness and headaches.  ?Psychiatric/Behavioral:  Negative for confusion, sleep disturbance and suicidal ideas. The patient is nervous/anxious.   ? ?Objective:  ?BP 124/76   Pulse 85   Temp 98.3 ?F (36.8 ?C) (Oral)   Ht '5\' 2"'  (1.575 m)   Wt 249 lb 2 oz (113 kg)   SpO2 94%   BMI 45.57 kg/m?  ? ?BP Readings from Last 3 Encounters:  ?06/22/21 124/76  ?06/01/21 94/62   ?03/29/21 (!) 110/56  ? ? ?Wt Readings from Last 3 Encounters:  ?06/22/21 249 lb 2 oz (113 kg)  ?06/01/21 258 lb (117 kg)  ?03/29/21 251 lb (113.9 kg)  ? ? ?Physical Exam ?Constitutional:   ?   General: She is not in acute distress. ?   Appearance: She is well-developed. She is obese.  ?HENT:  ?   Head: Normocephalic.  ?   Right Ear: External ear normal.  ?   Left Ear: External ear normal.  ?   Nose: Nose normal.  ?Eyes:  ?   General:     ?   Right eye: No discharge.     ?   Left eye: No discharge.  ?   Conjunctiva/sclera: Conjunctivae normal.  ?   Pupils: Pupils are equal, round, and reactive to light.  ?Neck:  ?   Thyroid: No thyromegaly.  ?   Vascular: No JVD.  ?   Trachea: No tracheal deviation.  ?Cardiovascular:  ?   Rate and Rhythm: Normal rate and regular rhythm.  ?   Heart sounds: Normal heart sounds.  ?Pulmonary:  ?   Effort: No respiratory distress.  ?   Breath sounds: No stridor. No wheezing.  ?Abdominal:  ?   General: Bowel sounds are normal. There is no distension.  ?   Palpations: Abdomen is soft. There is no mass.  ?   Tenderness: There is no abdominal tenderness. There is no guarding or rebound.  ?Musculoskeletal:     ?   General: Tenderness present.  ?   Cervical back: Normal range of motion and neck supple. No rigidity.  ?Lymphadenopathy:  ?   Cervical: No cervical adenopathy.  ?Skin: ?   Findings: No erythema or rash.  ?Neurological:  ?   Mental Status: She is oriented to person, place, and time.  ?   Cranial Nerves: No cranial nerve deficit.  ?   Motor: No abnormal muscle tone.  ?   Coordination: Coordination abnormal.  ?   Gait: Gait abnormal.  ?   Deep Tendon Reflexes: Reflexes normal.  ?Psychiatric:     ?   Behavior: Behavior normal.     ?   Thought Content: Thought content normal.     ?   Judgment: Judgment normal.  ?In a w/c ? ?Lab Results  ?Component Value Date  ? WBC 6.5 09/07/2020  ? HGB 14.4 09/07/2020  ? HCT 42.1 09/07/2020  ? PLT 275.0 09/07/2020  ? GLUCOSE 183 (H) 06/22/2021  ? CHOL  207 (H) 07/12/2020  ? TRIG 277 (H) 07/12/2020  ? HDL 39 (L) 07/12/2020  ? LDLDIRECT 155.9 09/30/2012  ? LDLCALC 119 (H) 07/12/2020  ? ALT 23 06/22/2021  ? AST 35 06/22/2021  ?  NA 136 06/22/2021  ? K 4.0 06/22/2021  ? CL 99 06/22/2021  ? CREATININE 0.90 06/22/2021  ? BUN 19 06/22/2021  ? CO2 27 06/22/2021  ? TSH 2.08 12/21/2020  ? INR 1.2 01/10/2019  ? HGBA1C 9.4 (H) 06/22/2021  ? MICROALBUR 4.5 (H) 12/02/2009  ? ? ?CT Chest Wo Contrast ? ?Result Date: 07/09/2019 ?CLINICAL DATA:  Follow-up of inferior lingular nodule. EXAM: CT CHEST WITHOUT CONTRAST TECHNIQUE: Multidetector CT imaging of the chest was performed following the standard protocol without IV contrast. COMPARISON:  CTA of the chest of 01/07/2019. FINDINGS: Cardiovascular: Aortic atherosclerosis. Aortic valve repair. Tortuous thoracic aorta. Mild cardiomegaly, without pericardial effusion. Pacer. Lad coronary artery calcification. Pulmonary artery enlargement, outflow tract 3.1 cm Mediastinum/Nodes: No mediastinal or definite hilar adenopathy, given limitations of unenhanced CT. Moderate hiatal hernia. Lungs/Pleura: No pleural fluid. Lingular scarring. Lingular nodule of 7 mm on 72/3 is similar on the prior exam and back on 10/07/2008, considered benign. Posterior left upper lobe calcified granulomas. Upper Abdomen: Hepatic steatosis. Normal imaged portions of the spleen, pancreas, adrenal glands, left kidney. Musculoskeletal: Advanced lower thoracic spondylosis. Eccentric right T12 vertebral hemangioma. IMPRESSION: 1. Similar lingular nodule, including back to 10/07/2008. This can be considered benign. 2. Pulmonary artery enlargement suggests pulmonary arterial hypertension. 3. Moderate hiatal hernia. 4. Hepatic steatosis. 5. Coronary artery atherosclerosis. Aortic Atherosclerosis (ICD10-I70.0). Electronically Signed   By: Abigail Miyamoto M.D.   On: 07/09/2019 12:40  ? ? ?Assessment & Plan:  ? ?Problem List Items Addressed This Visit   ? ? Hypothyroidism  ?   Chronic  ?Cont on Levothroid ?Check TSH ?  ?  ? Relevant Orders  ? Hemoglobin A1c (Completed)  ? Comprehensive metabolic panel (Completed)  ? DM type 2, controlled, with complication ([place])  ?  Probably worse.  C

## 2021-06-22 NOTE — Assessment & Plan Note (Signed)
Chronic  Cont on Levothroid Check TSH 

## 2021-06-22 NOTE — Assessment & Plan Note (Addendum)
Chronic  Continue on lorazepam prn  Potential benefits of a long term prn benzodiazepines  use as well as potential risks  and complications were explained to the patient and were aknowledged. 

## 2021-06-26 NOTE — Assessment & Plan Note (Signed)
Seems to be compensated at present.  Continue on Entresto, metoprolol ?

## 2021-07-01 ENCOUNTER — Ambulatory Visit (INDEPENDENT_AMBULATORY_CARE_PROVIDER_SITE_OTHER): Payer: Medicare Other

## 2021-07-01 DIAGNOSIS — Z0000 Encounter for general adult medical examination without abnormal findings: Secondary | ICD-10-CM | POA: Diagnosis not present
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
## 2021-07-01 NOTE — Patient Instructions (Signed)
Jessica [name] , ?Thank you for taking time to come for your Medicare Wellness Visit. I appreciate your ongoing commitment to your health goals. Please review the following plan we discussed and let me know if I can assist you in the future.  ? ?Screening recommendations/referrals: ?Colonoscopy: no longer required  ?Mammogram: no longer required  ?Bone Density: 01/04/2018 ?Recommended yearly ophthalmology/optometry visit for glaucoma screening and checkup ?Recommended yearly dental visit for hygiene and checkup ? ?Vaccinations: ?Influenza vaccine: completed  ?Pneumococcal vaccine: completed ?Tdap vaccine: 10/14/2014 ?Shingles vaccine: will consider    ? ?Advanced directives: none  ? ?Conditions/risks identified: none  ? ?Next appointment: none  ? ? ?Preventive Care 96 Years and Older, Female ?Preventive care refers to lifestyle choices and visits with your health care provider that can promote health and wellness. ?What does preventive care include? ?A yearly physical exam. This is also called an annual well check. ?Dental exams once or twice a year. ?Routine eye exams. Ask your health care provider how often you should have your eyes checked. ?Personal lifestyle choices, including: ?Daily care of your teeth and gums. ?Regular physical activity. ?Eating a healthy diet. ?Avoiding tobacco and drug use. ?Limiting alcohol use. ?Practicing safe sex. ?Taking low-dose aspirin every day. ?Taking vitamin and mineral supplements as recommended by your health care provider. ?What happens during an annual well check? ?The services and screenings done by your health care provider during your annual well check will depend on your age, overall health, lifestyle risk factors, and family history of disease. ?Counseling  ?Your health care provider may ask you questions about your: ?Alcohol use. ?Tobacco use. ?Drug use. ?Emotional well-being. ?Home and relationship well-being. ?Sexual activity. ?Eating habits. ?History of falls. ?Memory  and ability to understand (cognition). ?Work and work Statistician. ?Reproductive health. ?Screening  ?You may have the following tests or measurements: ?Height, weight, and BMI. ?Blood pressure. ?Lipid and cholesterol levels. These may be checked every 5 years, or more frequently if you are over 1 years old. ?Skin check. ?Lung cancer screening. You may have this screening every year starting at age 1 if you have a 30-pack-year history of smoking and currently smoke or have quit within the past 15 years. ?Fecal occult blood test (FOBT) of the stool. You may have this test every year starting at age 55. ?Flexible sigmoidoscopy or colonoscopy. You may have a sigmoidoscopy every 5 years or a colonoscopy every 10 years starting at age 66. ?Hepatitis C blood test. ?Hepatitis B blood test. ?Sexually transmitted disease (STD) testing. ?Diabetes screening. This is done by checking your blood sugar (glucose) after you have not eaten for a while (fasting). You may have this done every 1-3 years. ?Bone density scan. This is done to screen for osteoporosis. You may have this done starting at age 70. ?Mammogram. This may be done every 1-2 years. Talk to your health care provider about how often you should have regular mammograms. ?Talk with your health care provider about your test results, treatment options, and if necessary, the need for more tests. ?Vaccines  ?Your health care provider may recommend certain vaccines, such as: ?Influenza vaccine. This is recommended every year. ?Tetanus, diphtheria, and acellular pertussis (Tdap, Td) vaccine. You may need a Td booster every 10 years. ?Zoster vaccine. You may need this after age 62. ?Pneumococcal 13-valent conjugate (PCV13) vaccine. One dose is recommended after age 4. ?Pneumococcal polysaccharide (PPSV23) vaccine. One dose is recommended after age 58. ?Talk to your health care provider about which screenings and vaccines  you need and how often you need them. ?This  information is not intended to replace advice given to you by your health care provider. Make sure you discuss any questions you have with your health care provider. ?Document Released: 04/16/2015 Document Revised: 12/08/2015 Document Reviewed: 01/19/2015 ?Elsevier Interactive Patient Education ? 2017 [place]. ? ?Fall Prevention in the Home ?Falls can cause injuries. They can happen to people of all ages. There are many things you can do to make your home safe and to help prevent falls. ?What can I do on the outside of my home? ?Regularly fix the edges of walkways and driveways and fix any cracks. ?Remove anything that might make you trip as you walk through a door, such as a raised step or threshold. ?Trim any bushes or trees on the path to your home. ?Use bright outdoor lighting. ?Clear any walking paths of anything that might make someone trip, such as rocks or tools. ?Regularly check to see if handrails are loose or broken. Make sure that both sides of any steps have handrails. ?Any raised decks and porches should have guardrails on the edges. ?Have any leaves, snow, or ice cleared regularly. ?Use sand or salt on walking paths during winter. ?Clean up any spills in your garage right away. This includes oil or grease spills. ?What can I do in the bathroom? ?Use night lights. ?Install grab bars by the toilet and in the tub and shower. Do not use towel bars as grab bars. ?Use non-skid mats or decals in the tub or shower. ?If you need to sit down in the shower, use a plastic, non-slip stool. ?Keep the floor dry. Clean up any water that spills on the floor as soon as it happens. ?Remove soap buildup in the tub or shower regularly. ?Attach bath mats securely with double-sided non-slip rug tape. ?Do not have throw rugs and other things on the floor that can make you trip. ?What can I do in the bedroom? ?Use night lights. ?Make sure that you have a light by your bed that is easy to reach. ?Do not use any sheets or  blankets that are too big for your bed. They should not hang down onto the floor. ?Have a firm chair that has side arms. You can use this for support while you get dressed. ?Do not have throw rugs and other things on the floor that can make you trip. ?What can I do in the kitchen? ?Clean up any spills right away. ?Avoid walking on wet floors. ?Keep items that you use a lot in easy-to-reach places. ?If you need to reach something above you, use a strong step stool that has a grab bar. ?Keep electrical cords out of the way. ?Do not use floor polish or wax that makes floors slippery. If you must use wax, use non-skid floor wax. ?Do not have throw rugs and other things on the floor that can make you trip. ?What can I do with my stairs? ?Do not leave any items on the stairs. ?Make sure that there are handrails on both sides of the stairs and use them. Fix handrails that are broken or loose. Make sure that handrails are as long as the stairways. ?Check any carpeting to make sure that it is firmly attached to the stairs. Fix any carpet that is loose or worn. ?Avoid having throw rugs at the top or bottom of the stairs. If you do have throw rugs, attach them to the floor with carpet tape. ?Make sure  that you have a light switch at the top of the stairs and the bottom of the stairs. If you do not have them, ask someone to add them for you. ?What else can I do to help prevent falls? ?Wear shoes that: ?Do not have high heels. ?Have rubber bottoms. ?Are comfortable and fit you well. ?Are closed at the toe. Do not wear sandals. ?If you use a stepladder: ?Make sure that it is fully opened. Do not climb a closed stepladder. ?Make sure that both sides of the stepladder are locked into place. ?Ask someone to hold it for you, if possible. ?Clearly mark and make sure that you can see: ?Any grab bars or handrails. ?First and last steps. ?Where the edge of each step is. ?Use tools that help you move around (mobility aids) if they are  needed. These include: ?Canes. ?Walkers. ?Scooters. ?Crutches. ?Turn on the lights when you go into a dark area. Replace any light bulbs as soon as they burn out. ?Set up your furniture so you have a clear path.

## 2021-07-08 NOTE — Telephone Encounter (Signed)
Patient approved through 04/02/2022 ?

## 2021-07-18 ENCOUNTER — Encounter (HOSPITAL_COMMUNITY): Payer: Self-pay | Admitting: Gastroenterology

## 2021-07-22 ENCOUNTER — Encounter: Payer: Medicare Other | Admitting: Cardiology

## 2021-07-25 ENCOUNTER — Encounter (HOSPITAL_COMMUNITY)
Admission: RE | Disposition: A | Discharge status: Home / Self Care | Payer: Self-pay | Source: Home / Self Care | Attending: Gastroenterology

## 2021-07-25 ENCOUNTER — Encounter (HOSPITAL_COMMUNITY): Payer: Self-pay | Admitting: Gastroenterology

## 2021-07-25 ENCOUNTER — Ambulatory Visit (HOSPITAL_COMMUNITY)
Admission: RE | Admit: 2021-07-25 | Discharge: 2021-07-25 | Disposition: A | Discharge status: Home / Self Care | Payer: Medicare Other | Attending: Gastroenterology | Admitting: Gastroenterology

## 2021-07-25 ENCOUNTER — Ambulatory Visit (HOSPITAL_COMMUNITY): Payer: Medicare Other | Admitting: Registered Nurse

## 2021-07-25 ENCOUNTER — Ambulatory Visit (HOSPITAL_BASED_OUTPATIENT_CLINIC_OR_DEPARTMENT_OTHER): Payer: Medicare Other | Admitting: Registered Nurse

## 2021-07-25 ENCOUNTER — Other Ambulatory Visit: Payer: Self-pay

## 2021-07-25 DIAGNOSIS — I110 Hypertensive heart disease with heart failure: Secondary | ICD-10-CM | POA: Diagnosis not present

## 2021-07-25 DIAGNOSIS — K921 Melena: Secondary | ICD-10-CM | POA: Diagnosis not present

## 2021-07-25 DIAGNOSIS — F419 Anxiety disorder, unspecified: Secondary | ICD-10-CM | POA: Diagnosis not present

## 2021-07-25 DIAGNOSIS — G709 Myoneural disorder, unspecified: Secondary | ICD-10-CM | POA: Insufficient documentation

## 2021-07-25 DIAGNOSIS — K648 Other hemorrhoids: Secondary | ICD-10-CM

## 2021-07-25 DIAGNOSIS — Z7984 Long term (current) use of oral hypoglycemic drugs: Secondary | ICD-10-CM | POA: Diagnosis not present

## 2021-07-25 DIAGNOSIS — E039 Hypothyroidism, unspecified: Secondary | ICD-10-CM | POA: Insufficient documentation

## 2021-07-25 DIAGNOSIS — K5790 Diverticulosis of intestine, part unspecified, without perforation or abscess without bleeding: Secondary | ICD-10-CM | POA: Diagnosis not present

## 2021-07-25 DIAGNOSIS — K625 Hemorrhage of anus and rectum: Secondary | ICD-10-CM

## 2021-07-25 DIAGNOSIS — K5730 Diverticulosis of large intestine without perforation or abscess without bleeding: Secondary | ICD-10-CM | POA: Diagnosis not present

## 2021-07-25 DIAGNOSIS — Z6841 Body Mass Index (BMI) 40.0 and over, adult: Secondary | ICD-10-CM | POA: Diagnosis not present

## 2021-07-25 DIAGNOSIS — F32A Depression, unspecified: Secondary | ICD-10-CM | POA: Diagnosis not present

## 2021-07-25 DIAGNOSIS — K5731 Diverticulosis of large intestine without perforation or abscess with bleeding: Secondary | ICD-10-CM

## 2021-07-25 DIAGNOSIS — E1122 Type 2 diabetes mellitus with diabetic chronic kidney disease: Secondary | ICD-10-CM | POA: Diagnosis not present

## 2021-07-25 DIAGNOSIS — I509 Heart failure, unspecified: Secondary | ICD-10-CM

## 2021-07-25 DIAGNOSIS — I130 Hypertensive heart and chronic kidney disease with heart failure and stage 1 through stage 4 chronic kidney disease, or unspecified chronic kidney disease: Secondary | ICD-10-CM | POA: Insufficient documentation

## 2021-07-25 DIAGNOSIS — M1990 Unspecified osteoarthritis, unspecified site: Secondary | ICD-10-CM | POA: Insufficient documentation

## 2021-07-25 DIAGNOSIS — N189 Chronic kidney disease, unspecified: Secondary | ICD-10-CM | POA: Diagnosis not present

## 2021-07-25 DIAGNOSIS — K644 Residual hemorrhoidal skin tags: Secondary | ICD-10-CM | POA: Diagnosis not present

## 2021-07-25 DIAGNOSIS — I5032 Chronic diastolic (congestive) heart failure: Secondary | ICD-10-CM | POA: Diagnosis not present

## 2021-07-25 HISTORY — PX: COLONOSCOPY WITH PROPOFOL: SHX5780

## 2021-07-25 LAB — GLUCOSE, CAPILLARY: Glucose-Capillary: 215 mg/dL — ABNORMAL HIGH (ref 70–99)

## 2021-07-25 SURGERY — COLONOSCOPY WITH PROPOFOL
Anesthesia: Monitor Anesthesia Care

## 2021-07-25 MED ORDER — PROPOFOL 500 MG/50ML IV EMUL
INTRAVENOUS | Status: AC
  Filled 2021-07-25: qty 50

## 2021-07-25 MED ORDER — PROPOFOL 10 MG/ML IV BOLUS
INTRAVENOUS | Status: DC | PRN
  Administered 2021-07-25 (×10): 20 mg via INTRAVENOUS
  Administered 2021-07-25: 40 mg via INTRAVENOUS
  Administered 2021-07-25 (×5): 20 mg via INTRAVENOUS

## 2021-07-25 MED ORDER — LIDOCAINE 2% (20 MG/ML) 5 ML SYRINGE
INTRAMUSCULAR | Status: DC | PRN
Start: 2021-07-25 — End: 2021-07-25
  Administered 2021-07-25: 50 mg via INTRAVENOUS

## 2021-07-25 MED ORDER — PHENYLEPHRINE 80 MCG/ML (10ML) SYRINGE FOR IV PUSH (FOR BLOOD PRESSURE SUPPORT)
PREFILLED_SYRINGE | INTRAVENOUS | Status: DC | PRN
  Administered 2021-07-25: 160 ug via INTRAVENOUS
  Administered 2021-07-25 (×2): 80 ug via INTRAVENOUS

## 2021-07-25 MED ORDER — SODIUM CHLORIDE 0.9 % IV SOLN: INTRAVENOUS | Status: DC

## 2021-07-25 SURGICAL SUPPLY — 22 items
ELECT REM PT RETURN 9FT ADLT (ELECTROSURGICAL)
ELECTRODE REM PT RTRN 9FT ADLT (ELECTROSURGICAL) IMPLANT
FCP BXJMBJMB 240X2.8X (CUTTING FORCEPS)
FLOOR PAD 36X40 (MISCELLANEOUS) ×2
FORCEPS BIOP RAD 4 LRG CAP 4 (CUTTING FORCEPS) IMPLANT
FORCEPS BIOP RJ4 240 W/NDL (CUTTING FORCEPS)
FORCEPS BXJMBJMB 240X2.8X (CUTTING FORCEPS) IMPLANT
INJECTOR/SNARE I SNARE (MISCELLANEOUS) IMPLANT
LUBRICANT JELLY 4.5OZ STERILE (MISCELLANEOUS) IMPLANT
MANIFOLD NEPTUNE II (INSTRUMENTS) IMPLANT
NDL SCLEROTHERAPY 25GX240 (NEEDLE) IMPLANT
NEEDLE SCLEROTHERAPY 25GX240 (NEEDLE) IMPLANT
PAD FLOOR 36X40 (MISCELLANEOUS) ×2 IMPLANT
PROBE APC STR FIRE (PROBE) IMPLANT
PROBE INJECTION GOLD (MISCELLANEOUS)
PROBE INJECTION GOLD 7FR (MISCELLANEOUS) IMPLANT
SNARE ROTATE MED OVAL 20MM (MISCELLANEOUS) IMPLANT
SYR 50ML LL SCALE MARK (SYRINGE) IMPLANT
TRAP SPECIMEN MUCOUS 40CC (MISCELLANEOUS) IMPLANT
TUBING ENDO SMARTCAP PENTAX (MISCELLANEOUS) IMPLANT
TUBING IRRIGATION ENDOGATOR (MISCELLANEOUS) ×3 IMPLANT
WATER STERILE IRR 1000ML POUR (IV SOLUTION) IMPLANT
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
## 2021-07-25 NOTE — Anesthesia Postprocedure Evaluation (Signed)
Anesthesia Post Note ? ?Patient: Jessica [name] ? ?Procedure(s) Performed: COLONOSCOPY WITH PROPOFOL ? ?  ? ?Patient location during evaluation: Endoscopy ?Anesthesia Type: MAC ?Level of consciousness: awake ?Pain management: pain level controlled ?Vital Signs Assessment: post-procedure vital signs reviewed and stable ?Respiratory status: spontaneous breathing, nonlabored ventilation, respiratory function stable and patient connected to nasal cannula oxygen ?Cardiovascular status: stable and blood pressure returned to baseline ?Postop Assessment: no apparent nausea or vomiting ?Anesthetic complications: no ? ? ?No notable events documented. ? ?Last Vitals:  ?Vitals:  ? 07/25/21 0930 07/25/21 0940  ?BP: (!) 108/50 (!) 105/40  ?Pulse: 75 63  ?Resp: (!) 21 17  ?Temp:    ?SpO2: 98% 98%  ?  ?Last Pain:  ?Vitals:  ? 07/25/21 0940  ?TempSrc:   ?PainSc: 0-No pain  ? ? ?  ?  ?  ?  ?  ?  ? ?Youssouf Shipley P Ysidro Ramsay ? ? ? ? ?

## 2021-07-25 NOTE — Transfer of Care (Signed)
Immediate Anesthesia Transfer of Care Note ? ?Patient: Jessica [name] ? ?Procedure(s) Performed: COLONOSCOPY WITH PROPOFOL ? ?Patient Location: PACU ? ?Anesthesia Type:MAC ? ?Level of Consciousness: sedated ? ?Airway & Oxygen Therapy: Patient Spontanous Breathing and Patient connected to face mask oxygen ? ?Post-op Assessment: Report given to RN and Post -op Vital signs reviewed and stable ? ?Post vital signs: Reviewed and stable ? ?Last Vitals:  ?Vitals Value Taken Time  ?BP    ?Temp    ?Pulse    ?Resp    ?SpO2    ? ? ?Last Pain:  ?Vitals:  ? 07/25/21 0804  ?TempSrc: Oral  ?PainSc: 0-No pain  ?   ? ?  ? ?Complications: No notable events documented. ?

## 2021-07-25 NOTE — Discharge Instructions (Signed)

## 2021-07-25 NOTE — Anesthesia Preprocedure Evaluation (Addendum)
Anesthesia Evaluation  ?Patient identified by MRN, date of birth, ID band ?Patient awake ? ? ? ?Reviewed: ?Allergy & Precautions, NPO status , Patient's Chart, lab work & pertinent test results ? ?Airway ?Mallampati: III ? ? ? ? ? ? Dental ? ?(+) Edentulous Upper, Edentulous Lower ?  ?Pulmonary ?neg pulmonary ROS,  ?  ?Pulmonary exam normal ? ? ? ? ? ? ? Cardiovascular ?hypertension, Pt. on home beta blockers ?+CHF and + DVT  ?Normal cardiovascular exam+ dysrhythmias + pacemaker  ? ? ?  ?Neuro/Psych ?PSYCHIATRIC DISORDERS Anxiety Depression  Neuromuscular disease   ? GI/Hepatic ?Neg liver ROS, hiatal hernia, Bowel prep,  ?Endo/Other  ?diabetes, Oral Hypoglycemic AgentsHypothyroidism Morbid obesity ? Renal/GU ?Renal disease  ? ?  ?Musculoskeletal ? ?(+) Arthritis ,  ? Abdominal ?(+) + obese,   ?Peds ? Hematology ?negative hematology ROS ?(+)   ?Anesthesia Other Findings ?rectal bleeding ? Reproductive/Obstetrics ? ?  ? ? ? ? ? ? ? ? ? ? ? ? ? ?  ?  ? ? ? ? ? ? ?Anesthesia Physical ?Anesthesia Plan ? ?ASA: 3 ? ?Anesthesia Plan: MAC  ? ?Post-op Pain Management:   ? ?Induction: Intravenous ? ?PONV Risk Score and Plan: 2 and Propofol infusion and Treatment may vary due to age or medical condition ? ?Airway Management Planned: Simple Face Mask ? ?Additional Equipment:  ? ?Intra-op Plan:  ? ?Post-operative Plan:  ? ?Informed Consent: I have reviewed the patients History and Physical, chart, labs and discussed the procedure including the risks, benefits and alternatives for the proposed anesthesia with the patient or authorized representative who has indicated his/her understanding and acceptance.  ? ? ? ? ? ?Plan Discussed with: CRNA ? ?Anesthesia Plan Comments:   ? ? ? ? ? ? ?Anesthesia Quick Evaluation ? ?

## 2021-07-25 NOTE — Op Note (Signed)
[facility] ?Patient Name: Jessica [name] ?Procedure Date: 07/25/2021 ?MRN: [id number] ?Attending MD: Mauri Pole , MD ?Date of Birth: 21-Jan-1934 ?CSN: [id number] ?Age: 88 ?Admit Type: Outpatient ?Procedure:                Colonoscopy ?Indications:              Evaluation of unexplained GI bleeding presenting  ?                          with Hematochezia ?Providers:                Mauri Pole, MD, Doristine Johns, RN,  ?                          Gloris Ham, Technician, William Dalton,  ?                          Technician ?Referring MD:              ?Medicines:                Monitored Anesthesia Care ?Complications:            No immediate complications. ?Estimated Blood Loss:     Estimated blood loss was minimal. ?Procedure:                Pre-Anesthesia Assessment: ?                          - Prior to the procedure, a History and Physical  ?                          was performed, and patient medications and  ?                          allergies were reviewed. The patient's tolerance of  ?                          previous anesthesia was also reviewed. The risks  ?                          and benefits of the procedure and the sedation  ?                          options and risks were discussed with the patient.  ?                          All questions were answered, and informed consent  ?                          was obtained. Prior Anticoagulants: The patient has  ?                          taken no previous anticoagulant or antiplatelet  ?                          agents. ASA Grade Assessment: III - A  patient with  ?                          severe systemic disease. After reviewing the risks  ?                          and benefits, the patient was deemed in  ?                          satisfactory condition to undergo the procedure. ?                          After obtaining informed consent, the colonoscope  ?                          was passed under direct  vision. Throughout the  ?                          procedure, the patient's blood pressure, pulse, and  ?                          oxygen saturations were monitored continuously. The  ?                          PCF-HQ190L ([id number]) Olympus colonoscope was  ?                          introduced through the anus and advanced to the the  ?                          cecum, identified by appendiceal orifice and  ?                          ileocecal valve. The colonoscopy was performed  ?                          without difficulty. The patient tolerated the  ?                          procedure well. The quality of the bowel  ?                          preparation was good. The ileocecal valve,  ?                          appendiceal orifice, and rectum were photographed. ?Scope In: 8:44:33 AM ?Scope Out: 9:05:30 AM ?Scope Withdrawal Time: 0 hours 15 minutes 36 seconds  ?Total Procedure Duration: 0 hours 20 minutes 57 seconds  ?Findings: ?     The perianal and digital rectal examinations were normal. ?     Multiple small and large-mouthed diverticula were found in the sigmoid  ?     colon and descending colon. There was no evidence of diverticular  ?     bleeding. ?     Non-bleeding external and internal hemorrhoids were found during  ?  retroflexion. The hemorrhoids were medium-sized. ?     The exam was otherwise without abnormality. ?Impression:               - Moderate diverticulosis in the sigmoid colon and  ?                          in the descending colon. There was no evidence of  ?                          diverticular bleeding. ?                          - Non-bleeding external and internal hemorrhoids. ?                          - The examination was otherwise normal. ?                          - No specimens collected. ?Moderate Sedation: ?     Not Applicable - Patient had care per Anesthesia. ?Recommendation:           - Patient has a contact number available for  ?                          emergencies.  The signs and symptoms of potential  ?                          delayed complications were discussed with the  ?                          patient. Return to normal activities tomorrow.  ?                          Written discharge instructions were provided to the  ?                          patient. ?                          - Resume previous diet. ?                          - Continue present medications. ?                          - No repeat colonoscopy due to age. ?                          - Return to GI clinic PRN. ?Procedure Code(s):        --- Professional --- ?                          [phone], Colonoscopy, flexible; diagnostic, including  ?                          collection of specimen(s) by brushing or washing,  ?  when performed (separate procedure) ?Diagnosis Code(s):        --- Professional --- ?                          O17.5, Other hemorrhoids ?                          K92.1, Melena (includes Hematochezia) ?                          K57.30, Diverticulosis of large intestine without  ?                          perforation or abscess without bleeding ?CPT copyright 2019 [employer]. All rights reserved. ?The codes documented in this report are preliminary and upon coder review may  ?be revised to meet current compliance requirements. ?Mauri Pole, MD ?07/25/2021 9:18:57 AM ?This report has been signed electronically. ?Number of Addenda: 0 ?

## 2021-07-25 NOTE — Anesthesia Procedure Notes (Signed)
Procedure Name: [place] ?Date/Time: 07/25/2021 8:41 AM ?Performed by: Talbot Grumbling, CRNA ?Pre-anesthesia Checklist: Patient identified, Emergency Drugs available, Suction available and Patient being monitored ?Patient Re-evaluated:Patient Re-evaluated prior to induction ?Oxygen Delivery Method: Simple face mask ? ? ? ? ?

## 2021-07-27 ENCOUNTER — Encounter (HOSPITAL_COMMUNITY): Payer: Self-pay | Admitting: Gastroenterology

## 2021-08-11 ENCOUNTER — Encounter: Payer: Medicare Other | Admitting: Cardiology

## 2021-08-17 ENCOUNTER — Telehealth (HOSPITAL_BASED_OUTPATIENT_CLINIC_OR_DEPARTMENT_OTHER): Payer: Self-pay | Admitting: *Deleted

## 2021-08-17 MED ORDER — ENTRESTO 97-103 MG PO TABS: 1.0000 | ORAL_TABLET | Freq: Two times a day (BID) | ORAL | 3 refills | Status: DC

## 2021-08-18 NOTE — Telephone Encounter (Signed)
Faxed patient assistance forms for Entresto, confirmation received

## 2021-08-23 ENCOUNTER — Ambulatory Visit (INDEPENDENT_AMBULATORY_CARE_PROVIDER_SITE_OTHER): Payer: Medicare Other

## 2021-08-23 DIAGNOSIS — I442 Atrioventricular block, complete: Secondary | ICD-10-CM

## 2021-08-25 LAB — CUP PACEART REMOTE DEVICE CHECK
Battery Impedance: 1308 Ohm
Battery Remaining Longevity: 51 mo
Battery Voltage: 2.78 V
Brady Statistic AP VP Percent: 21 %
Brady Statistic AP VS Percent: 0 %
Brady Statistic AS VP Percent: 79 %
Brady Statistic AS VS Percent: 0 %
Date Time Interrogation Session: 20230524112827
Implantable Lead Implant Date: 20140710
Implantable Lead Implant Date: 20140710
Implantable Lead Location: 753859
Implantable Lead Location: 753860
Implantable Lead Model: 5092
Implantable Lead Model: 5592
Implantable Pulse Generator Implant Date: 20140710
Lead Channel Impedance Value: 474 Ohm
Lead Channel Impedance Value: 799 Ohm
Lead Channel Pacing Threshold Amplitude: 0.375 V
Lead Channel Pacing Threshold Amplitude: 0.5 V
Lead Channel Pacing Threshold Pulse Width: 0.4 ms
Lead Channel Pacing Threshold Pulse Width: 0.4 ms
Lead Channel Setting Pacing Amplitude: 2 V
Lead Channel Setting Pacing Amplitude: 2.5 V
Lead Channel Setting Pacing Pulse Width: 0.4 ms
Lead Channel Setting Sensing Sensitivity: 4 mV

## 2021-09-07 NOTE — Progress Notes (Signed)
Remote pacemaker transmission.   

## 2021-09-08 ENCOUNTER — Other Ambulatory Visit: Payer: Self-pay | Admitting: Internal Medicine

## 2021-09-08 NOTE — Telephone Encounter (Signed)
Patient needs her ativan prescription sent to [facility] - she received all her other prescription but the ativan needs to be sent in.

## 2021-09-08 NOTE — Telephone Encounter (Signed)
Check [place] registry last filled 06/08/2021.Marland KitchenJohny [name]

## 2021-09-12 MED ORDER — LORAZEPAM 0.5 MG PO TABS: 0.5000 mg | ORAL_TABLET | Freq: Two times a day (BID) | ORAL | 1 refills | Status: DC | PRN

## 2021-09-20 ENCOUNTER — Encounter: Payer: Self-pay | Admitting: Cardiology

## 2021-09-20 ENCOUNTER — Ambulatory Visit: Payer: Medicare Other | Admitting: Cardiology

## 2021-09-20 VITALS — BP 110/60 | HR 92 | Ht 62.0 in | Wt 248.0 lb

## 2021-09-20 DIAGNOSIS — I4892 Unspecified atrial flutter: Secondary | ICD-10-CM

## 2021-09-20 DIAGNOSIS — I5032 Chronic diastolic (congestive) heart failure: Secondary | ICD-10-CM

## 2021-09-20 DIAGNOSIS — Z950 Presence of cardiac pacemaker: Secondary | ICD-10-CM

## 2021-09-20 DIAGNOSIS — I442 Atrioventricular block, complete: Secondary | ICD-10-CM | POA: Diagnosis not present

## 2021-09-20 DIAGNOSIS — I4891 Unspecified atrial fibrillation: Secondary | ICD-10-CM | POA: Diagnosis not present

## 2021-09-20 LAB — PACEMAKER DEVICE OBSERVATION

## 2021-09-20 MED ORDER — APIXABAN 5 MG PO TABS: 5.0000 mg | ORAL_TABLET | Freq: Two times a day (BID) | ORAL | 3 refills | Status: DC
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
## 2021-09-20 NOTE — Patient Instructions (Addendum)
Medication Instructions:  Start Eliquis 5 mg two times a day Your physician recommends that you continue on your current medications as directed. Please refer to the Current Medication list given to you today. *If you need a refill on your cardiac medications before your next appointment, please call your pharmacy*  Lab Work: None. If you have labs (blood work) drawn today and your tests are completely normal, you will receive your results only by: [place] (if you have MyChart) OR A paper copy in the mail If you have any lab test that is abnormal or we need to change your treatment, we will call you to review the results.  Testing/Procedures: None.  Follow-Up: At [facility], you and your health needs are our priority.  As part of our continuing mission to provide you with exceptional heart care, we have created designated Provider Care Teams.  These Care Teams include your primary Cardiologist (physician) and Advanced Practice Providers (APPs -  Physician Assistants and Nurse Practitioners) who all work together to provide you with the care you need, when you need it.  Your physician wants you to follow-up in: 12 months with Lars Mage, MD or one of the following Advanced Practice Providers on your designated Care Team:    Tommye Standard, [place] Legrand Como "Jonni Sanger" [place], [place]   You will receive a reminder letter in the mail two months in advance. If you don't receive a letter, please call our office to schedule the follow-up appointment.  We recommend signing up for the patient portal called "MyChart".  Sign up information is provided on this After Visit Summary.  MyChart is used to connect with patients for Virtual Visits (Telemedicine).  Patients are able to view lab/test results, encounter notes, upcoming appointments, etc.  Non-urgent messages can be sent to your provider as well.   To learn more about what you can do with MyChart, go to [email or web address].    Any Other  Special Instructions Will Be Listed Below (If Applicable).  [place]  Patient Assistance Programs  If you cannot afford one of the medications listed on the next page, please contact the patient assistance program at the phone number listed beside the medication you need assistance with.  Please ask any questions you have concerning their medication assistance program, and your eligibility for their assistance program.  In most cases, if you are approved you will receive your medication free of charge for the remainder of the year that you are applying in.  If it seems likely you would be approved, please ask them to mail an application to your home address (not faxed to the office).  You may also obtain an application by visiting the patient assistance program at the website listed beside the medication you need assistance with.  Once you receive the application, please complete your part of the application, obtain any documents required by the assistance program you are applying with (please see instruction page of the application for required documents), bring all paperwork to your Cardiology Provider's office for drop off and will we take care of the provider page of your application and fax to the appropriate patient assistance program you are applying with.  If you have any questions for [place] along the way, please reach out to [place] and we will assist you to the best of our ability.  We do ask that you allow [place] an adequate amount of time to get your application ready to be faxed to the [facility] you  are applying with.  You may call the Foundation you applied with to check the progress of your application at any time.  If you decide you do not want to apply for patient assistance, or if you are denied for patient assistance and cannot afford your medication, please contact your cardiologist's office so we can discuss switching you to a medication that is more  affordable for you.  Taking your medications as prescribed/directed by your Cardiologist for your heart condition is extremely important for your heart health.  ELIQUIS: [place] (BMSPAF) [phone] or online at [facility] [place] [email or web address]) to print an application (Requirements and instructions will be included with the application).

## 2021-09-21 NOTE — Telephone Encounter (Signed)
Approved through 04/02/2022. °

## 2021-09-22 ENCOUNTER — Encounter: Payer: Self-pay | Admitting: Internal Medicine

## 2021-09-22 ENCOUNTER — Ambulatory Visit (INDEPENDENT_AMBULATORY_CARE_PROVIDER_SITE_OTHER): Payer: Medicare Other | Admitting: Internal Medicine

## 2021-09-22 VITALS — BP 122/80 | HR 97 | Temp 98.6°F | Ht 62.0 in

## 2021-09-22 DIAGNOSIS — G8929 Other chronic pain: Secondary | ICD-10-CM

## 2021-09-22 DIAGNOSIS — E118 Type 2 diabetes mellitus with unspecified complications: Secondary | ICD-10-CM | POA: Diagnosis not present

## 2021-09-22 DIAGNOSIS — M545 Low back pain, unspecified: Secondary | ICD-10-CM

## 2021-09-22 DIAGNOSIS — M159 Polyosteoarthritis, unspecified: Secondary | ICD-10-CM

## 2021-09-22 DIAGNOSIS — I10 Essential (primary) hypertension: Secondary | ICD-10-CM | POA: Diagnosis not present

## 2021-09-22 LAB — COMPREHENSIVE METABOLIC PANEL
ALT: 20 U/L (ref 0–35)
AST: 27 U/L (ref 0–37)
Albumin: 4 g/dL (ref 3.5–5.2)
Alkaline Phosphatase: 72 U/L (ref 39–117)
BUN: 21 mg/dL (ref 6–23)
CO2: 26 mEq/L (ref 19–32)
Calcium: 10.1 mg/dL (ref 8.4–10.5)
Chloride: 101 mEq/L (ref 96–112)
Creatinine, Ser: 0.9 mg/dL (ref 0.40–1.20)
GFR: 58.09 mL/min — ABNORMAL LOW (ref >60.00)
Glucose, Bld: 230 mg/dL — ABNORMAL HIGH (ref 70–99)
Potassium: 4 mEq/L (ref 3.5–5.1)
Sodium: 138 mEq/L (ref 135–145)
Total Bilirubin: 0.4 mg/dL (ref 0.2–1.2)
Total Protein: 7.9 g/dL (ref 6.0–8.3)

## 2021-09-22 LAB — HEMOGLOBIN A1C: Hgb A1c MFr Bld: 9.1 % — ABNORMAL HIGH (ref 4.6–6.5)

## 2021-09-22 NOTE — Assessment & Plan Note (Signed)
Wt Readings from Last 3 Encounters:  09/20/21 248 lb (112.5 kg)  07/25/21 247 lb (112 kg)  06/22/21 249 lb 2 oz (113 kg)  Refractory

## 2021-09-22 NOTE — Assessment & Plan Note (Signed)
Continue on Entresto, Metoprolol 

## 2021-09-22 NOTE — Assessment & Plan Note (Signed)
Pt refused Norco

## 2021-09-22 NOTE — Assessment & Plan Note (Signed)
On Metformin, Prandin 

## 2021-09-26 ENCOUNTER — Telehealth: Payer: Self-pay

## 2021-09-27 MED ORDER — TEMAZEPAM 15 MG PO CAPS: ORAL_CAPSULE | ORAL | 5 refills | Status: DC

## 2021-10-17 ENCOUNTER — Encounter: Payer: Self-pay | Admitting: Cardiology

## 2021-11-06 ENCOUNTER — Other Ambulatory Visit: Payer: Self-pay | Admitting: Internal Medicine

## 2021-11-06 DIAGNOSIS — E118 Type 2 diabetes mellitus with unspecified complications: Secondary | ICD-10-CM

## 2021-11-13 ENCOUNTER — Other Ambulatory Visit (HOSPITAL_BASED_OUTPATIENT_CLINIC_OR_DEPARTMENT_OTHER): Payer: Self-pay | Admitting: Cardiovascular Disease

## 2021-11-14 NOTE — Telephone Encounter (Signed)
Rx(s) sent to pharmacy electronically.  

## 2021-11-22 ENCOUNTER — Ambulatory Visit (INDEPENDENT_AMBULATORY_CARE_PROVIDER_SITE_OTHER): Payer: Medicare Other

## 2021-11-22 DIAGNOSIS — I442 Atrioventricular block, complete: Secondary | ICD-10-CM | POA: Diagnosis not present

## 2021-11-22 LAB — CUP PACEART REMOTE DEVICE CHECK
Battery Impedance: 1394 Ohm
Battery Remaining Longevity: 47 mo
Battery Voltage: 2.77 V
Brady Statistic AP VP Percent: 24 %
Brady Statistic AP VS Percent: 0 %
Brady Statistic AS VP Percent: 75 %
Brady Statistic AS VS Percent: 0 %
Date Time Interrogation Session: 20230822135256
Implantable Lead Implant Date: 20140710
Implantable Lead Implant Date: 20140710
Implantable Lead Location: 753859
Implantable Lead Location: 753860
Implantable Lead Model: 5092
Implantable Lead Model: 5592
Implantable Pulse Generator Implant Date: 20140710
Lead Channel Impedance Value: 448 Ohm
Lead Channel Impedance Value: 673 Ohm
Lead Channel Pacing Threshold Amplitude: 0.375 V
Lead Channel Pacing Threshold Amplitude: 0.5 V
Lead Channel Pacing Threshold Pulse Width: 0.4 ms
Lead Channel Pacing Threshold Pulse Width: 0.4 ms
Lead Channel Setting Pacing Amplitude: 2 V
Lead Channel Setting Pacing Amplitude: 2.5 V
Lead Channel Setting Pacing Pulse Width: 0.4 ms
Lead Channel Setting Sensing Sensitivity: 4 mV

## 2021-12-20 NOTE — Progress Notes (Signed)
Remote pacemaker transmission.   

## 2021-12-26 ENCOUNTER — Ambulatory Visit (INDEPENDENT_AMBULATORY_CARE_PROVIDER_SITE_OTHER): Payer: Medicare Other | Admitting: Internal Medicine

## 2021-12-26 ENCOUNTER — Encounter: Payer: Self-pay | Admitting: Internal Medicine

## 2021-12-26 VITALS — BP 124/66 | HR 87 | Temp 98.3°F | Ht 62.0 in | Wt 250.4 lb

## 2021-12-26 DIAGNOSIS — Z23 Encounter for immunization: Secondary | ICD-10-CM

## 2021-12-26 DIAGNOSIS — G8929 Other chronic pain: Secondary | ICD-10-CM | POA: Diagnosis not present

## 2021-12-26 DIAGNOSIS — I10 Essential (primary) hypertension: Secondary | ICD-10-CM

## 2021-12-26 DIAGNOSIS — M545 Low back pain, unspecified: Secondary | ICD-10-CM | POA: Diagnosis not present

## 2021-12-26 DIAGNOSIS — E118 Type 2 diabetes mellitus with unspecified complications: Secondary | ICD-10-CM

## 2021-12-26 DIAGNOSIS — M159 Polyosteoarthritis, unspecified: Secondary | ICD-10-CM

## 2021-12-26 DIAGNOSIS — F419 Anxiety disorder, unspecified: Secondary | ICD-10-CM

## 2021-12-26 DIAGNOSIS — I5032 Chronic diastolic (congestive) heart failure: Secondary | ICD-10-CM | POA: Diagnosis not present

## 2021-12-26 LAB — COMPREHENSIVE METABOLIC PANEL
ALT: 17 U/L (ref 0–35)
AST: 24 U/L (ref 0–37)
Albumin: 4 g/dL (ref 3.5–5.2)
Alkaline Phosphatase: 77 U/L (ref 39–117)
BUN: 24 mg/dL — ABNORMAL HIGH (ref 6–23)
CO2: 25 mEq/L (ref 19–32)
Calcium: 9.9 mg/dL (ref 8.4–10.5)
Chloride: 100 mEq/L (ref 96–112)
Creatinine, Ser: 0.94 mg/dL (ref 0.40–1.20)
GFR: 55.03 mL/min — ABNORMAL LOW (ref >60.00)
Glucose, Bld: 341 mg/dL — ABNORMAL HIGH (ref 70–99)
Potassium: 4.1 mEq/L (ref 3.5–5.1)
Sodium: 135 mEq/L (ref 135–145)
Total Bilirubin: 0.4 mg/dL (ref 0.2–1.2)
Total Protein: 8.1 g/dL (ref 6.0–8.3)

## 2021-12-26 LAB — TSH: TSH: 2.81 u[IU]/mL (ref 0.35–5.50)

## 2021-12-26 MED ORDER — PRAMOXINE-HC 1-1 % EX CREA
TOPICAL_CREAM | Freq: Two times a day (BID) | CUTANEOUS | 1 refills | Status: DC
Start: 2021-12-26 — End: 2022-04-09

## 2021-12-26 NOTE — Assessment & Plan Note (Signed)
Continue on Entresto, Metoprolol

## 2021-12-26 NOTE — Assessment & Plan Note (Signed)
Chronic  Continue on lorazepam prn  Potential benefits of a long term prn benzodiazepines  use as well as potential risks  and complications were explained to the patient and were aknowledged.

## 2021-12-26 NOTE — Progress Notes (Signed)
Subjective:  Patient ID: Jessica [name], female    DOB: Aug 13, 1933  Age: 88 y.o. MRN: [id number]  CC: Follow-up (3 month f/u- Flu shot)   HPI Jessica [name] presents for hemorrhoids, DM, CHF  Outpatient Medications Prior to Visit  Medication Sig Dispense Refill   allopurinol (ZYLOPRIM) 100 MG tablet TAKE 1 TABLET BY MOUTH  DAILY 90 tablet 3   apixaban (ELIQUIS) 5 MG TABS tablet Take 1 tablet (5 mg total) by mouth 2 (two) times daily. 180 tablet 3   Ascorbic Acid (VITAMIN C PO) Take 1 tablet by mouth daily.     b complex vitamins tablet Take 1 tablet by mouth daily.     Blood Glucose Calibration (OT ULTRA/FASTTK CNTRL SOLN) SOLN Use as directed 1 each 0   Blood Glucose Monitoring Suppl (ONE TOUCH ULTRA 2) w/Device KIT Use as instructed to check blood sugar BID 1 kit 0   Cholecalciferol 1000 UNITS tablet Take 1,000 Units by mouth daily.     furosemide (LASIX) 40 MG tablet TAKE 1 TABLET BY MOUTH  TWICE DAILY 180 tablet 3   glucose blood (ONETOUCH ULTRA) test strip Use as instructed to check blood sugar BID 200 each 3   Lancets (ONETOUCH ULTRASOFT) lancets Use as instructed to check blood sugar BID 200 each 3   levothyroxine (SYNTHROID) 75 MCG tablet TAKE 1 TABLET BY MOUTH  DAILY 90 tablet 3   LORazepam (ATIVAN) 0.5 MG tablet Take 1 tablet (0.5 mg total) by mouth 2 (two) times daily as needed. for anxiety 180 tablet 1   metFORMIN (GLUCOPHAGE) 500 MG tablet TAKE 1 TABLET BY MOUTH  TWICE DAILY WITH A MEAL 180 tablet 3   metoprolol succinate (TOPROL-XL) 50 MG 24 hr tablet TAKE 1 TABLET BY MOUTH  DAILY WITH OR IMMEDIATELY  FOLLOWING A MEAL 90 tablet 3   Multiple Vitamin (MULTIVITAMIN WITH MINERALS) TABS tablet Take 1 tablet by mouth daily. Women's Centrum     Multiple Vitamins-Minerals (PRESERVISION AREDS 2 PO) Take 1 tablet by mouth 2 (two) times daily.     Polyethyl Glycol-Propyl Glycol (LUBRICANT EYE DROPS) 0.4-0.3 % SOLN Place 1 drop into both eyes 4 (four) times daily as needed  (dry/irritated eyes.).     potassium chloride SA (KLOR-CON M) 20 MEQ tablet TAKE 1 TABLET BY MOUTH  DAILY 90 tablet 3   repaglinide (PRANDIN) 2 MG tablet TAKE 2 TABLETS BY MOUTH 3  TIMES DAILY BEFORE MEALS 540 tablet 3   rosuvastatin (CRESTOR) 10 MG tablet TAKE 1 TABLET BY MOUTH UP TO 3  TIMES WEEKLY AS TOLERATED 43 tablet 2   sacubitril-valsartan (ENTRESTO) 97-103 MG Take 1 tablet by mouth 2 (two) times daily. 180 tablet 3   temazepam (RESTORIL) 15 MG capsule TAKE 1 CAPSULE BY MOUTH EVERY NIGHT AT BEDTIME AS NEEDED 30 capsule 5   venlafaxine XR (EFFEXOR-XR) 75 MG 24 hr capsule TAKE 1 CAPSULE BY MOUTH  DAILY WITH BREAKFAST 90 capsule 3   ezetimibe (ZETIA) 10 MG tablet Take 1 tablet (10 mg total) by mouth daily. 90 tablet 3   pramoxine-hydrocortisone (ANALPRAM HC) cream Apply topically in the morning and at bedtime. Apply pea sized amount per rectum for 7 days (Patient not taking: Reported on 12/26/2021) 30 g 1   No facility-administered medications prior to visit.    ROS: Review of Systems  Constitutional:  Negative for activity change, appetite change, chills, fatigue and unexpected weight change.  HENT:  Negative for congestion, mouth sores and sinus pressure.  Eyes:  Negative for visual disturbance.  Respiratory:  Negative for cough and chest tightness.   Cardiovascular:  Negative for leg swelling.  Gastrointestinal:  Positive for abdominal distention. Negative for abdominal pain and nausea.  Genitourinary:  Negative for difficulty urinating, frequency and vaginal pain.  Musculoskeletal:  Positive for arthralgias, back pain and gait problem.  Skin:  Negative for pallor and rash.  Neurological:  Negative for dizziness, tremors, weakness, numbness and headaches.  Psychiatric/Behavioral:  Positive for dysphoric mood. Negative for confusion, decreased concentration, sleep disturbance and suicidal ideas. The patient is nervous/anxious.     Objective:  BP 124/66 (BP Location: Left Arm)    Pulse 87   Temp 98.3 F (36.8 C) (Oral)   Ht '5\' 2"'  (1.575 m)   Wt 250 lb 6.4 oz (113.6 kg)   SpO2 96%   BMI 45.80 kg/m   BP Readings from Last 3 Encounters:  12/26/21 124/66  09/22/21 122/80  09/20/21 110/60    Wt Readings from Last 3 Encounters:  12/26/21 250 lb 6.4 oz (113.6 kg)  09/20/21 248 lb (112.5 kg)  07/25/21 247 lb (112 kg)    Physical Exam Constitutional:      General: She is not in acute distress.    Appearance: She is well-developed. She is obese.  HENT:     Head: Normocephalic.     Right Ear: External ear normal.     Left Ear: External ear normal.     Nose: Nose normal.  Eyes:     General:        Right eye: No discharge.        Left eye: No discharge.     Conjunctiva/sclera: Conjunctivae normal.     Pupils: Pupils are equal, round, and reactive to light.  Neck:     Thyroid: No thyromegaly.     Vascular: No JVD.     Trachea: No tracheal deviation.  Cardiovascular:     Rate and Rhythm: Normal rate and regular rhythm.     Heart sounds: Normal heart sounds.  Pulmonary:     Effort: No respiratory distress.     Breath sounds: No stridor. No wheezing.  Abdominal:     General: Bowel sounds are normal. There is no distension.     Palpations: Abdomen is soft. There is no mass.     Tenderness: There is no abdominal tenderness. There is no guarding or rebound.  Musculoskeletal:        General: Tenderness present.     Cervical back: Normal range of motion and neck supple. No rigidity.     Right lower leg: No edema.     Left lower leg: No edema.  Lymphadenopathy:     Cervical: No cervical adenopathy.  Skin:    Findings: No erythema or rash.  Neurological:     Mental Status: She is oriented to person, place, and time.     Cranial Nerves: No cranial nerve deficit.     Motor: No abnormal muscle tone.     Coordination: Coordination normal.     Gait: Gait abnormal.     Deep Tendon Reflexes: Reflexes normal.  Psychiatric:        Behavior: Behavior normal.         Thought Content: Thought content normal.        Judgment: Judgment normal.   In a w/c  Lab Results  Component Value Date   WBC 6.5 09/07/2020   HGB 14.4 09/07/2020   HCT 42.1 09/07/2020  PLT 275.0 09/07/2020   GLUCOSE 341 (H) 12/26/2021   CHOL 207 (H) 07/12/2020   TRIG 277 (H) 07/12/2020   HDL 39 (L) 07/12/2020   LDLDIRECT 155.9 09/30/2012   LDLCALC 119 (H) 07/12/2020   ALT 17 12/26/2021   AST 24 12/26/2021   NA 135 12/26/2021   K 4.1 12/26/2021   CL 100 12/26/2021   CREATININE 0.94 12/26/2021   BUN 24 (H) 12/26/2021   CO2 25 12/26/2021   TSH 2.81 12/26/2021   INR 1.2 01/10/2019   HGBA1C 10.0 (H) 12/26/2021   MICROALBUR 4.5 (H) 12/02/2009    No results found.  Assessment & Plan:   Problem List Items Addressed This Visit     Anxiety disorder    Chronic  Continue on lorazepam prn  Potential benefits of a long term prn benzodiazepines  use as well as potential risks  and complications were explained to the patient and were aknowledged.      Chronic diastolic heart failure (HCC)    Continue on Entresto, metoprolol      DM type 2, controlled, with complication ([place]) - Primary    Continue on Metformin, Prandin We considered Rybelsus, but it is too $$$. The patient is very inactive. Cont w/wt loss effort  Diabetes remains uncontrolled.  She can continue with repaglinide and metformin.  We can add  Actos - she will let me know if she is willing to start using insulin.      Relevant Orders   Comprehensive metabolic panel (Completed)   Hemoglobin A1c (Completed)   TSH (Completed)   Essential hypertension    Continue on Entresto, Metoprolol      Relevant Orders   TSH (Completed)   Low back pain    Get a rollator walker      OBESITY, MORBID    Wt Readings from Last 3 Encounters:  12/26/21 250 lb 6.4 oz (113.6 kg)  09/20/21 248 lb (112.5 kg)  07/25/21 247 lb (112 kg)        Osteoarthritis    Get a rollator walker      Other Visit Diagnoses      Needs flu shot       Relevant Orders   Flu Vaccine QUAD High Dose(Fluad) (Completed)         Meds ordered this encounter  Medications   pramoxine-hydrocortisone (ANALPRAM HC) cream    Sig: Apply topically in the morning and at bedtime. Use prn qhs    Dispense:  60 g    Refill:  1      Follow-up: Return in about 3 months (around 03/27/2022) for a follow-up visit.  Walker Kehr, MD

## 2021-12-26 NOTE — Assessment & Plan Note (Signed)
Get a rollator walker

## 2021-12-26 NOTE — Assessment & Plan Note (Signed)
Wt Readings from Last 3 Encounters:  12/26/21 250 lb 6.4 oz (113.6 kg)  09/20/21 248 lb (112.5 kg)  07/25/21 247 lb (112 kg)

## 2021-12-26 NOTE — Assessment & Plan Note (Signed)
Continue on Entresto, metoprolol

## 2021-12-26 NOTE — Assessment & Plan Note (Signed)
Continue on Metformin, Prandin We considered Rybelsus, but it is too $$$. The patient is very inactive. Cont w/wt loss effort  Diabetes remains uncontrolled.  She can continue with repaglinide and metformin.  We can add  Actos - she will let me know if she is willing to start using insulin.

## 2021-12-27 LAB — HEMOGLOBIN A1C: Hgb A1c MFr Bld: 10 % — ABNORMAL HIGH (ref 4.6–6.5)

## 2022-01-25 ENCOUNTER — Telehealth: Payer: Self-pay | Admitting: *Deleted

## 2022-01-25 NOTE — Telephone Encounter (Signed)
Rec'd a accessible collection service application. Needing MD to complete so she dn;t have to pull trash can to the street. Place on MD desk.Marland KitchenJohny Chess

## 2022-01-25 NOTE — Telephone Encounter (Signed)
MD signed place form in self address and mailed to [facility].Marland KitchenJohny Chess

## 2022-02-13 ENCOUNTER — Other Ambulatory Visit (HOSPITAL_BASED_OUTPATIENT_CLINIC_OR_DEPARTMENT_OTHER): Payer: Self-pay | Admitting: Cardiovascular Disease

## 2022-02-13 ENCOUNTER — Other Ambulatory Visit: Payer: Self-pay | Admitting: Internal Medicine

## 2022-02-21 ENCOUNTER — Ambulatory Visit (INDEPENDENT_AMBULATORY_CARE_PROVIDER_SITE_OTHER): Payer: Medicare Other

## 2022-02-21 DIAGNOSIS — I442 Atrioventricular block, complete: Secondary | ICD-10-CM | POA: Diagnosis not present

## 2022-02-21 LAB — CUP PACEART REMOTE DEVICE CHECK
Battery Impedance: 1531 Ohm
Battery Remaining Longevity: 45 mo
Battery Voltage: 2.77 V
Brady Statistic AP VP Percent: 19 %
Brady Statistic AP VS Percent: 0 %
Brady Statistic AS VP Percent: 81 %
Brady Statistic AS VS Percent: 0 %
Date Time Interrogation Session: 20231121113308
Implantable Lead Connection Status: 753985
Implantable Lead Connection Status: 753985
Implantable Lead Implant Date: 20140710
Implantable Lead Implant Date: 20140710
Implantable Lead Location: 753859
Implantable Lead Location: 753860
Implantable Lead Model: 5092
Implantable Lead Model: 5592
Implantable Pulse Generator Implant Date: 20140710
Lead Channel Impedance Value: 467 Ohm
Lead Channel Impedance Value: 820 Ohm
Lead Channel Pacing Threshold Amplitude: 0.375 V
Lead Channel Pacing Threshold Amplitude: 0.5 V
Lead Channel Pacing Threshold Pulse Width: 0.4 ms
Lead Channel Pacing Threshold Pulse Width: 0.4 ms
Lead Channel Setting Pacing Amplitude: 2 V
Lead Channel Setting Pacing Amplitude: 2.5 V
Lead Channel Setting Pacing Pulse Width: 0.4 ms
Lead Channel Setting Sensing Sensitivity: 4 mV
Zone Setting Status: 755011
Zone Setting Status: 755011

## 2022-03-07 ENCOUNTER — Telehealth (HOSPITAL_BASED_OUTPATIENT_CLINIC_OR_DEPARTMENT_OTHER): Payer: Self-pay | Admitting: *Deleted

## 2022-03-07 MED ORDER — ENTRESTO 97-103 MG PO TABS: 1.0000 | ORAL_TABLET | Freq: Two times a day (BID) | ORAL | 3 refills | Status: DC

## 2022-03-07 NOTE — Telephone Encounter (Signed)
Patient assistance renewal papers received from patient  Filled out and waiting for Dr Oval Linsey to sign

## 2022-03-08 ENCOUNTER — Telehealth: Payer: Self-pay | Admitting: Internal Medicine

## 2022-03-08 ENCOUNTER — Other Ambulatory Visit: Payer: Self-pay | Admitting: Internal Medicine

## 2022-03-08 NOTE — Telephone Encounter (Signed)
Caller & Relationship to patient: Self  Call back number: [phone]   Date of last office visit: 9.25.23  Date of next office visit: 1.2.24  Medication(s) to be refilled:  LORazepam (ATIVAN) 0.5 MG tablet    Preferred Pharmacy:  [facility]   Phone: [phone]  Fax: [phone]

## 2022-03-10 NOTE — Telephone Encounter (Signed)
It was done.  Thank you 

## 2022-03-16 NOTE — Telephone Encounter (Signed)
Faxed 12/7, confirmation received

## 2022-03-17 NOTE — Telephone Encounter (Signed)
Received notification approved through 04/03/2023  Left message approved

## 2022-03-17 NOTE — Progress Notes (Signed)
Remote pacemaker transmission.   

## 2022-03-20 ENCOUNTER — Other Ambulatory Visit: Payer: Self-pay | Admitting: Internal Medicine

## 2022-03-28 ENCOUNTER — Encounter (HOSPITAL_COMMUNITY): Payer: Self-pay | Admitting: Internal Medicine

## 2022-03-28 ENCOUNTER — Emergency Department (HOSPITAL_COMMUNITY): Payer: Medicare Other

## 2022-03-28 ENCOUNTER — Ambulatory Visit: Payer: Medicare Other | Admitting: Internal Medicine

## 2022-03-28 ENCOUNTER — Other Ambulatory Visit: Payer: Self-pay

## 2022-03-28 ENCOUNTER — Inpatient Hospital Stay (HOSPITAL_COMMUNITY)
Admission: EM | Admit: 2022-03-28 | Discharge: 2022-04-09 | DRG: 314 | Disposition: A | Discharge status: Skilled Nursing Facility | Payer: Medicare Other | Attending: Internal Medicine | Admitting: Internal Medicine

## 2022-03-28 DIAGNOSIS — Z823 Family history of stroke: Secondary | ICD-10-CM

## 2022-03-28 DIAGNOSIS — I5033 Acute on chronic diastolic (congestive) heart failure: Secondary | ICD-10-CM | POA: Diagnosis not present

## 2022-03-28 DIAGNOSIS — J9601 Acute respiratory failure with hypoxia: Secondary | ICD-10-CM | POA: Diagnosis not present

## 2022-03-28 DIAGNOSIS — I482 Chronic atrial fibrillation, unspecified: Secondary | ICD-10-CM | POA: Diagnosis present

## 2022-03-28 DIAGNOSIS — K219 Gastro-esophageal reflux disease without esophagitis: Secondary | ICD-10-CM | POA: Diagnosis present

## 2022-03-28 DIAGNOSIS — E569 Vitamin deficiency, unspecified: Secondary | ICD-10-CM | POA: Diagnosis not present

## 2022-03-28 DIAGNOSIS — M1990 Unspecified osteoarthritis, unspecified site: Secondary | ICD-10-CM | POA: Diagnosis present

## 2022-03-28 DIAGNOSIS — I2510 Atherosclerotic heart disease of native coronary artery without angina pectoris: Secondary | ICD-10-CM | POA: Diagnosis present

## 2022-03-28 DIAGNOSIS — E861 Hypovolemia: Secondary | ICD-10-CM | POA: Diagnosis present

## 2022-03-28 DIAGNOSIS — R309 Painful micturition, unspecified: Secondary | ICD-10-CM | POA: Diagnosis present

## 2022-03-28 DIAGNOSIS — Z86711 Personal history of pulmonary embolism: Secondary | ICD-10-CM

## 2022-03-28 DIAGNOSIS — E119 Type 2 diabetes mellitus without complications: Secondary | ICD-10-CM | POA: Diagnosis not present

## 2022-03-28 DIAGNOSIS — T826XXA Infection and inflammatory reaction due to cardiac valve prosthesis, initial encounter: Secondary | ICD-10-CM | POA: Diagnosis not present

## 2022-03-28 DIAGNOSIS — I480 Paroxysmal atrial fibrillation: Secondary | ICD-10-CM | POA: Diagnosis not present

## 2022-03-28 DIAGNOSIS — Z66 Do not resuscitate: Secondary | ICD-10-CM | POA: Diagnosis not present

## 2022-03-28 DIAGNOSIS — R0689 Other abnormalities of breathing: Secondary | ICD-10-CM | POA: Diagnosis not present

## 2022-03-28 DIAGNOSIS — Z7901 Long term (current) use of anticoagulants: Secondary | ICD-10-CM

## 2022-03-28 DIAGNOSIS — J441 Chronic obstructive pulmonary disease with (acute) exacerbation: Secondary | ICD-10-CM | POA: Diagnosis not present

## 2022-03-28 DIAGNOSIS — A419 Sepsis, unspecified organism: Secondary | ICD-10-CM | POA: Insufficient documentation

## 2022-03-28 DIAGNOSIS — M109 Gout, unspecified: Secondary | ICD-10-CM | POA: Diagnosis not present

## 2022-03-28 DIAGNOSIS — I441 Atrioventricular block, second degree: Secondary | ICD-10-CM | POA: Diagnosis present

## 2022-03-28 DIAGNOSIS — Y831 Surgical operation with implant of artificial internal device as the cause of abnormal reaction of the patient, or of later complication, without mention of misadventure at the time of the procedure: Secondary | ICD-10-CM | POA: Diagnosis present

## 2022-03-28 DIAGNOSIS — Z8249 Family history of ischemic heart disease and other diseases of the circulatory system: Secondary | ICD-10-CM

## 2022-03-28 DIAGNOSIS — R0902 Hypoxemia: Secondary | ICD-10-CM | POA: Diagnosis not present

## 2022-03-28 DIAGNOSIS — I4891 Unspecified atrial fibrillation: Secondary | ICD-10-CM | POA: Diagnosis not present

## 2022-03-28 DIAGNOSIS — I330 Acute and subacute infective endocarditis: Secondary | ICD-10-CM | POA: Diagnosis present

## 2022-03-28 DIAGNOSIS — Z7409 Other reduced mobility: Secondary | ICD-10-CM | POA: Diagnosis present

## 2022-03-28 DIAGNOSIS — I5032 Chronic diastolic (congestive) heart failure: Secondary | ICD-10-CM | POA: Diagnosis present

## 2022-03-28 DIAGNOSIS — R6520 Severe sepsis without septic shock: Secondary | ICD-10-CM | POA: Diagnosis not present

## 2022-03-28 DIAGNOSIS — Z888 Allergy status to other drugs, medicaments and biological substances status: Secondary | ICD-10-CM

## 2022-03-28 DIAGNOSIS — I38 Endocarditis, valve unspecified: Secondary | ICD-10-CM | POA: Diagnosis present

## 2022-03-28 DIAGNOSIS — E1165 Type 2 diabetes mellitus with hyperglycemia: Secondary | ICD-10-CM | POA: Diagnosis not present

## 2022-03-28 DIAGNOSIS — R4182 Altered mental status, unspecified: Secondary | ICD-10-CM | POA: Diagnosis present

## 2022-03-28 DIAGNOSIS — N179 Acute kidney failure, unspecified: Secondary | ICD-10-CM | POA: Diagnosis present

## 2022-03-28 DIAGNOSIS — I4892 Unspecified atrial flutter: Secondary | ICD-10-CM | POA: Diagnosis not present

## 2022-03-28 DIAGNOSIS — F02A18 Dementia in other diseases classified elsewhere, mild, with other behavioral disturbance: Secondary | ICD-10-CM | POA: Diagnosis not present

## 2022-03-28 DIAGNOSIS — E876 Hypokalemia: Secondary | ICD-10-CM | POA: Diagnosis not present

## 2022-03-28 DIAGNOSIS — F419 Anxiety disorder, unspecified: Secondary | ICD-10-CM | POA: Diagnosis present

## 2022-03-28 DIAGNOSIS — Z950 Presence of cardiac pacemaker: Secondary | ICD-10-CM | POA: Diagnosis present

## 2022-03-28 DIAGNOSIS — E7800 Pure hypercholesterolemia, unspecified: Secondary | ICD-10-CM | POA: Diagnosis present

## 2022-03-28 DIAGNOSIS — Z79890 Hormone replacement therapy (postmenopausal): Secondary | ICD-10-CM

## 2022-03-28 DIAGNOSIS — G9341 Metabolic encephalopathy: Secondary | ICD-10-CM | POA: Diagnosis not present

## 2022-03-28 DIAGNOSIS — B370 Candidal stomatitis: Secondary | ICD-10-CM | POA: Diagnosis not present

## 2022-03-28 DIAGNOSIS — J811 Chronic pulmonary edema: Secondary | ICD-10-CM | POA: Diagnosis not present

## 2022-03-28 DIAGNOSIS — Z7984 Long term (current) use of oral hypoglycemic drugs: Secondary | ICD-10-CM

## 2022-03-28 DIAGNOSIS — I130 Hypertensive heart and chronic kidney disease with heart failure and stage 1 through stage 4 chronic kidney disease, or unspecified chronic kidney disease: Secondary | ICD-10-CM | POA: Diagnosis not present

## 2022-03-28 DIAGNOSIS — T826XXD Infection and inflammatory reaction due to cardiac valve prosthesis, subsequent encounter: Secondary | ICD-10-CM | POA: Diagnosis not present

## 2022-03-28 DIAGNOSIS — N1831 Chronic kidney disease, stage 3a: Secondary | ICD-10-CM | POA: Diagnosis not present

## 2022-03-28 DIAGNOSIS — R7881 Bacteremia: Secondary | ICD-10-CM | POA: Diagnosis not present

## 2022-03-28 DIAGNOSIS — R930 Abnormal findings on diagnostic imaging of skull and head, not elsewhere classified: Secondary | ICD-10-CM | POA: Diagnosis not present

## 2022-03-28 DIAGNOSIS — Z8619 Personal history of other infectious and parasitic diseases: Secondary | ICD-10-CM

## 2022-03-28 DIAGNOSIS — R739 Hyperglycemia, unspecified: Secondary | ICD-10-CM | POA: Diagnosis not present

## 2022-03-28 DIAGNOSIS — I959 Hypotension, unspecified: Secondary | ICD-10-CM | POA: Diagnosis not present

## 2022-03-28 DIAGNOSIS — E039 Hypothyroidism, unspecified: Secondary | ICD-10-CM | POA: Diagnosis present

## 2022-03-28 DIAGNOSIS — T827XXD Infection and inflammatory reaction due to other cardiac and vascular devices, implants and grafts, subsequent encounter: Secondary | ICD-10-CM | POA: Diagnosis not present

## 2022-03-28 DIAGNOSIS — F32A Depression, unspecified: Secondary | ICD-10-CM | POA: Diagnosis present

## 2022-03-28 DIAGNOSIS — R9431 Abnormal electrocardiogram [ECG] [EKG]: Secondary | ICD-10-CM | POA: Diagnosis not present

## 2022-03-28 DIAGNOSIS — R410 Disorientation, unspecified: Secondary | ICD-10-CM | POA: Diagnosis not present

## 2022-03-28 DIAGNOSIS — I6523 Occlusion and stenosis of bilateral carotid arteries: Secondary | ICD-10-CM | POA: Diagnosis not present

## 2022-03-28 DIAGNOSIS — R6521 Severe sepsis with septic shock: Secondary | ICD-10-CM | POA: Diagnosis not present

## 2022-03-28 DIAGNOSIS — Z1152 Encounter for screening for COVID-19: Secondary | ICD-10-CM

## 2022-03-28 DIAGNOSIS — F410 Panic disorder [episodic paroxysmal anxiety] without agoraphobia: Secondary | ICD-10-CM | POA: Diagnosis not present

## 2022-03-28 DIAGNOSIS — Z807 Family history of other malignant neoplasms of lymphoid, hematopoietic and related tissues: Secondary | ICD-10-CM

## 2022-03-28 DIAGNOSIS — J90 Pleural effusion, not elsewhere classified: Secondary | ICD-10-CM | POA: Diagnosis not present

## 2022-03-28 DIAGNOSIS — I088 Other rheumatic multiple valve diseases: Secondary | ICD-10-CM | POA: Diagnosis not present

## 2022-03-28 DIAGNOSIS — Z833 Family history of diabetes mellitus: Secondary | ICD-10-CM

## 2022-03-28 DIAGNOSIS — E871 Hypo-osmolality and hyponatremia: Secondary | ICD-10-CM | POA: Diagnosis not present

## 2022-03-28 DIAGNOSIS — R531 Weakness: Secondary | ICD-10-CM | POA: Diagnosis not present

## 2022-03-28 DIAGNOSIS — R112 Nausea with vomiting, unspecified: Secondary | ICD-10-CM | POA: Diagnosis present

## 2022-03-28 DIAGNOSIS — E1122 Type 2 diabetes mellitus with diabetic chronic kidney disease: Secondary | ICD-10-CM | POA: Diagnosis present

## 2022-03-28 DIAGNOSIS — E118 Type 2 diabetes mellitus with unspecified complications: Secondary | ICD-10-CM | POA: Diagnosis present

## 2022-03-28 DIAGNOSIS — I10 Essential (primary) hypertension: Secondary | ICD-10-CM | POA: Diagnosis present

## 2022-03-28 DIAGNOSIS — I639 Cerebral infarction, unspecified: Secondary | ICD-10-CM | POA: Diagnosis not present

## 2022-03-28 DIAGNOSIS — Z86718 Personal history of other venous thrombosis and embolism: Secondary | ICD-10-CM

## 2022-03-28 DIAGNOSIS — N200 Calculus of kidney: Secondary | ICD-10-CM | POA: Diagnosis not present

## 2022-03-28 DIAGNOSIS — R0682 Tachypnea, not elsewhere classified: Secondary | ICD-10-CM | POA: Diagnosis not present

## 2022-03-28 DIAGNOSIS — I259 Chronic ischemic heart disease, unspecified: Secondary | ICD-10-CM | POA: Diagnosis not present

## 2022-03-28 DIAGNOSIS — N183 Chronic kidney disease, stage 3 unspecified: Secondary | ICD-10-CM | POA: Diagnosis present

## 2022-03-28 DIAGNOSIS — R279 Unspecified lack of coordination: Secondary | ICD-10-CM | POA: Diagnosis not present

## 2022-03-28 DIAGNOSIS — I6203 Nontraumatic chronic subdural hemorrhage: Secondary | ICD-10-CM | POA: Diagnosis not present

## 2022-03-28 DIAGNOSIS — I672 Cerebral atherosclerosis: Secondary | ICD-10-CM | POA: Diagnosis not present

## 2022-03-28 DIAGNOSIS — B9561 Methicillin susceptible Staphylococcus aureus infection as the cause of diseases classified elsewhere: Secondary | ICD-10-CM | POA: Diagnosis not present

## 2022-03-28 DIAGNOSIS — E1142 Type 2 diabetes mellitus with diabetic polyneuropathy: Secondary | ICD-10-CM | POA: Diagnosis not present

## 2022-03-28 DIAGNOSIS — A4101 Sepsis due to Methicillin susceptible Staphylococcus aureus: Secondary | ICD-10-CM | POA: Diagnosis not present

## 2022-03-28 DIAGNOSIS — J9811 Atelectasis: Secondary | ICD-10-CM | POA: Diagnosis not present

## 2022-03-28 DIAGNOSIS — G4700 Insomnia, unspecified: Secondary | ICD-10-CM | POA: Diagnosis not present

## 2022-03-28 DIAGNOSIS — Z743 Need for continuous supervision: Secondary | ICD-10-CM | POA: Diagnosis not present

## 2022-03-28 DIAGNOSIS — J9690 Respiratory failure, unspecified, unspecified whether with hypoxia or hypercapnia: Secondary | ICD-10-CM | POA: Diagnosis not present

## 2022-03-28 DIAGNOSIS — Z954 Presence of other heart-valve replacement: Secondary | ICD-10-CM | POA: Diagnosis not present

## 2022-03-28 DIAGNOSIS — N184 Chronic kidney disease, stage 4 (severe): Secondary | ICD-10-CM | POA: Diagnosis present

## 2022-03-28 DIAGNOSIS — M6281 Muscle weakness (generalized): Secondary | ICD-10-CM | POA: Diagnosis not present

## 2022-03-28 DIAGNOSIS — K449 Diaphragmatic hernia without obstruction or gangrene: Secondary | ICD-10-CM | POA: Diagnosis not present

## 2022-03-28 DIAGNOSIS — Z6841 Body Mass Index (BMI) 40.0 and over, adult: Secondary | ICD-10-CM | POA: Diagnosis not present

## 2022-03-28 DIAGNOSIS — Z9049 Acquired absence of other specified parts of digestive tract: Secondary | ICD-10-CM

## 2022-03-28 DIAGNOSIS — Z79899 Other long term (current) drug therapy: Secondary | ICD-10-CM

## 2022-03-28 DIAGNOSIS — E785 Hyperlipidemia, unspecified: Secondary | ICD-10-CM | POA: Diagnosis present

## 2022-03-28 DIAGNOSIS — T827XXA Infection and inflammatory reaction due to other cardiac and vascular devices, implants and grafts, initial encounter: Secondary | ICD-10-CM

## 2022-03-28 DIAGNOSIS — Z885 Allergy status to narcotic agent status: Secondary | ICD-10-CM

## 2022-03-28 DIAGNOSIS — K5730 Diverticulosis of large intestine without perforation or abscess without bleeding: Secondary | ICD-10-CM | POA: Diagnosis not present

## 2022-03-28 DIAGNOSIS — Z95828 Presence of other vascular implants and grafts: Secondary | ICD-10-CM | POA: Diagnosis not present

## 2022-03-28 DIAGNOSIS — J189 Pneumonia, unspecified organism: Secondary | ICD-10-CM

## 2022-03-28 LAB — I-STAT VENOUS BLOOD GAS, ED
Acid-Base Excess: 1 mmol/L (ref 0.0–2.0)
Bicarbonate: 23.5 mmol/L (ref 20.0–28.0)
Calcium, Ion: 1.1 mmol/L — ABNORMAL LOW (ref 1.15–1.40)
HCT: 36 % (ref 36.0–46.0)
Hemoglobin: 12.2 g/dL (ref 12.0–15.0)
O2 Saturation: 39 %
Potassium: 3.9 mmol/L (ref 3.5–5.1)
Sodium: 133 mmol/L — ABNORMAL LOW (ref 135–145)
TCO2: 24 mmol/L (ref 22–32)
pCO2, Ven: 31.5 mmHg — ABNORMAL LOW (ref 44–60)
pH, Ven: 7.48 — ABNORMAL HIGH (ref 7.25–7.43)
pO2, Ven: 21 mmHg — CL (ref 32–45)

## 2022-03-28 LAB — COMPREHENSIVE METABOLIC PANEL
ALT: 28 U/L (ref 0–44)
AST: 58 U/L — ABNORMAL HIGH (ref 15–41)
Albumin: 3.4 g/dL — ABNORMAL LOW (ref 3.5–5.0)
Alkaline Phosphatase: 55 U/L (ref 38–126)
Anion gap: 16 — ABNORMAL HIGH (ref 5–15)
BUN: 17 mg/dL (ref 8–23)
CO2: 20 mmol/L — ABNORMAL LOW (ref 22–32)
Calcium: 9 mg/dL (ref 8.9–10.3)
Chloride: 97 mmol/L — ABNORMAL LOW (ref 98–111)
Creatinine, Ser: 1.31 mg/dL — ABNORMAL HIGH (ref 0.44–1.00)
GFR, Estimated: 40 mL/min — ABNORMAL LOW (ref >60)
Glucose, Bld: 319 mg/dL — ABNORMAL HIGH (ref 70–99)
Potassium: 3.9 mmol/L (ref 3.5–5.1)
Sodium: 133 mmol/L — ABNORMAL LOW (ref 135–145)
Total Bilirubin: 0.7 mg/dL (ref 0.3–1.2)
Total Protein: 7.5 g/dL (ref 6.5–8.1)

## 2022-03-28 LAB — CBC WITH DIFFERENTIAL/PLATELET
Abs Immature Granulocytes: 0.16 10*3/uL — ABNORMAL HIGH (ref 0.00–0.07)
Basophils Absolute: 0 10*3/uL (ref 0.0–0.1)
Basophils Relative: 0 %
Eosinophils Absolute: 0 10*3/uL (ref 0.0–0.5)
Eosinophils Relative: 0 %
HCT: 36.2 % (ref 36.0–46.0)
Hemoglobin: 12.2 g/dL (ref 12.0–15.0)
Immature Granulocytes: 1 %
Lymphocytes Relative: 2 %
Lymphs Abs: 0.2 10*3/uL — ABNORMAL LOW (ref 0.7–4.0)
MCH: 31.9 pg (ref 26.0–34.0)
MCHC: 33.7 g/dL (ref 30.0–36.0)
MCV: 94.5 fL (ref 80.0–100.0)
Monocytes Absolute: 1 10*3/uL (ref 0.1–1.0)
Monocytes Relative: 8 %
Neutro Abs: 11.8 10*3/uL — ABNORMAL HIGH (ref 1.7–7.7)
Neutrophils Relative %: 89 %
Platelets: 163 10*3/uL (ref 150–400)
RBC: 3.83 MIL/uL — ABNORMAL LOW (ref 3.87–5.11)
RDW: 13.4 % (ref 11.5–15.5)
WBC: 13.3 10*3/uL — ABNORMAL HIGH (ref 4.0–10.5)
nRBC: 0 % (ref 0.0–0.2)

## 2022-03-28 LAB — RESP PANEL BY RT-PCR (RSV, FLU A&B, COVID)  RVPGX2
Influenza A by PCR: NEGATIVE
Influenza B by PCR: NEGATIVE
Resp Syncytial Virus by PCR: NEGATIVE
SARS Coronavirus 2 by RT PCR: NEGATIVE

## 2022-03-28 LAB — URINALYSIS, ROUTINE W REFLEX MICROSCOPIC
Bacteria, UA: NONE SEEN
Bilirubin Urine: NEGATIVE
Glucose, UA: >=500 mg/dL — AB
Hgb urine dipstick: NEGATIVE
Ketones, ur: NEGATIVE mg/dL
Leukocytes,Ua: NEGATIVE
Nitrite: NEGATIVE
Protein, ur: 30 mg/dL — AB
Specific Gravity, Urine: 1.014 (ref 1.005–1.030)
pH: 5 (ref 5.0–8.0)

## 2022-03-28 LAB — PROTIME-INR
INR: 1.5 — ABNORMAL HIGH (ref 0.8–1.2)
Prothrombin Time: 17.8 seconds — ABNORMAL HIGH (ref 11.4–15.2)

## 2022-03-28 LAB — LACTIC ACID, PLASMA
Lactic Acid, Venous: 4.7 mmol/L (ref 0.5–1.9)
Lactic Acid, Venous: 3.7 mmol/L (ref 0.5–1.9)
Lactic Acid, Venous: 2.9 mmol/L (ref 0.5–1.9)

## 2022-03-28 LAB — PROCALCITONIN: Procalcitonin: 4.41 ng/mL

## 2022-03-28 LAB — TROPONIN I (HIGH SENSITIVITY)
Troponin I (High Sensitivity): 43 ng/L — ABNORMAL HIGH (ref <18)
Troponin I (High Sensitivity): 65 ng/L — ABNORMAL HIGH (ref <18)

## 2022-03-28 LAB — APTT: aPTT: 30 seconds (ref 24–36)

## 2022-03-28 LAB — CBG MONITORING, ED: Glucose-Capillary: 252 mg/dL — ABNORMAL HIGH (ref 70–99)

## 2022-03-28 LAB — TSH: TSH: 1.46 u[IU]/mL (ref 0.350–4.500)

## 2022-03-28 LAB — BRAIN NATRIURETIC PEPTIDE: B Natriuretic Peptide: 774.3 pg/mL — ABNORMAL HIGH (ref 0.0–100.0)

## 2022-03-28 MED ORDER — NOREPINEPHRINE 4 MG/250ML-% IV SOLN
2.0000 ug/min | INTRAVENOUS | Status: DC
  Administered 2022-03-28: 5 ug/min via INTRAVENOUS
  Administered 2022-03-29: 9 ug/min via INTRAVENOUS
  Administered 2022-03-29: 10 ug/min via INTRAVENOUS
  Administered 2022-03-30: 4 ug/min via INTRAVENOUS
  Filled 2022-03-28 (×4): qty 250

## 2022-03-28 MED ORDER — VANCOMYCIN HCL 1500 MG/300ML IV SOLN: 1500.0000 mg | INTRAVENOUS | Status: DC

## 2022-03-28 MED ORDER — CALCIUM GLUCONATE-NACL 1-0.675 GM/50ML-% IV SOLN
1.0000 g | Freq: Once | INTRAVENOUS | Status: AC
  Administered 2022-03-28: 1000 mg via INTRAVENOUS
  Filled 2022-03-28: qty 50

## 2022-03-28 MED ORDER — LEVOTHYROXINE SODIUM 75 MCG PO TABS
75.0000 ug | ORAL_TABLET | Freq: Every day | ORAL | Status: DC
  Administered 2022-03-29 – 2022-04-09 (×12): 75 ug via ORAL
  Filled 2022-03-28 (×4): qty 1
  Filled 2022-03-28: qty 3
  Filled 2022-03-28 (×7): qty 1

## 2022-03-28 MED ORDER — INSULIN ASPART 100 UNIT/ML IJ SOLN
0.0000 [IU] | Freq: Every day | INTRAMUSCULAR | Status: DC
  Administered 2022-03-28: 3 [IU] via SUBCUTANEOUS
  Administered 2022-03-29 – 2022-03-30 (×2): 2 [IU] via SUBCUTANEOUS

## 2022-03-28 MED ORDER — INSULIN ASPART 100 UNIT/ML IJ SOLN
0.0000 [IU] | Freq: Three times a day (TID) | INTRAMUSCULAR | Status: DC
  Administered 2022-03-29: 15 [IU] via SUBCUTANEOUS
  Administered 2022-03-29 (×2): 11 [IU] via SUBCUTANEOUS
  Administered 2022-03-30 (×2): 7 [IU] via SUBCUTANEOUS
  Administered 2022-03-30 – 2022-03-31 (×3): 4 [IU] via SUBCUTANEOUS

## 2022-03-28 MED ORDER — ACETAMINOPHEN 10 MG/ML IV SOLN
1000.0000 mg | Freq: Once | INTRAVENOUS | Status: AC
  Administered 2022-03-28: 1000 mg via INTRAVENOUS
  Filled 2022-03-28: qty 100

## 2022-03-28 MED ORDER — SODIUM CHLORIDE 0.9 % BOLUS PEDS
500.0000 mL | Freq: Once | INTRAVENOUS | Status: AC
  Administered 2022-03-28: 500 mL via INTRAVENOUS

## 2022-03-28 MED ORDER — VANCOMYCIN HCL 2000 MG/400ML IV SOLN
2000.0000 mg | Freq: Once | INTRAVENOUS | Status: AC
  Administered 2022-03-28: 2000 mg via INTRAVENOUS
  Filled 2022-03-28: qty 400

## 2022-03-28 MED ORDER — IBUPROFEN 100 MG/5ML PO SUSP
400.0000 mg | Freq: Once | ORAL | Status: AC
  Administered 2022-03-28: 400 mg via ORAL
  Filled 2022-03-28 (×2): qty 20

## 2022-03-28 MED ORDER — METRONIDAZOLE 500 MG/100ML IV SOLN
500.0000 mg | Freq: Once | INTRAVENOUS | Status: AC
  Administered 2022-03-28: 500 mg via INTRAVENOUS
  Filled 2022-03-28: qty 100

## 2022-03-28 MED ORDER — IOHEXOL 350 MG/ML SOLN
75.0000 mL | Freq: Once | INTRAVENOUS | Status: AC | PRN
  Administered 2022-03-28: 75 mL via INTRAVENOUS

## 2022-03-28 MED ORDER — ONDANSETRON HCL 4 MG/2ML IJ SOLN
4.0000 mg | Freq: Once | INTRAMUSCULAR | Status: AC
  Administered 2022-03-28: 4 mg via INTRAVENOUS
  Filled 2022-03-28: qty 2

## 2022-03-28 MED ORDER — SODIUM CHLORIDE 0.9 % IV BOLUS
1000.0000 mL | Freq: Once | INTRAVENOUS | Status: AC
  Administered 2022-03-28: 1000 mL via INTRAVENOUS

## 2022-03-28 MED ORDER — ALBUMIN HUMAN 25 % IV SOLN
12.5000 g | Freq: Once | INTRAVENOUS | Status: AC
  Administered 2022-03-28: 12.5 g via INTRAVENOUS
  Filled 2022-03-28: qty 50

## 2022-03-28 MED ORDER — KETOROLAC TROMETHAMINE 15 MG/ML IJ SOLN: 15.0000 mg | Freq: Once | INTRAMUSCULAR | Status: DC

## 2022-03-28 MED ORDER — DOCUSATE SODIUM 100 MG PO CAPS: 100.0000 mg | ORAL_CAPSULE | Freq: Two times a day (BID) | ORAL | Status: DC | PRN

## 2022-03-28 MED ORDER — ACETAMINOPHEN 650 MG RE SUPP: 650.0000 mg | Freq: Four times a day (QID) | RECTAL | Status: DC | PRN

## 2022-03-28 MED ORDER — SODIUM CHLORIDE 0.9 % IV BOLUS (SEPSIS)
1000.0000 mL | Freq: Once | INTRAVENOUS | Status: AC
  Administered 2022-03-28: 1000 mL via INTRAVENOUS

## 2022-03-28 MED ORDER — PANTOPRAZOLE SODIUM 40 MG IV SOLR
40.0000 mg | Freq: Once | INTRAVENOUS | Status: AC
  Administered 2022-03-28: 40 mg via INTRAVENOUS
  Filled 2022-03-28: qty 10

## 2022-03-28 MED ORDER — SODIUM CHLORIDE 0.9 % IV SOLN
250.0000 mL | INTRAVENOUS | Status: DC
  Administered 2022-04-03 – 2022-04-06 (×2): 250 mL via INTRAVENOUS

## 2022-03-28 MED ORDER — SODIUM CHLORIDE 0.9% FLUSH
3.0000 mL | Freq: Two times a day (BID) | INTRAVENOUS | Status: DC
  Administered 2022-03-28 – 2022-04-09 (×20): 3 mL via INTRAVENOUS

## 2022-03-28 MED ORDER — METRONIDAZOLE 500 MG/100ML IV SOLN
500.0000 mg | Freq: Two times a day (BID) | INTRAVENOUS | Status: DC
  Administered 2022-03-29: 500 mg via INTRAVENOUS
  Filled 2022-03-28: qty 100

## 2022-03-28 MED ORDER — VENLAFAXINE HCL ER 75 MG PO CP24
75.0000 mg | ORAL_CAPSULE | Freq: Every day | ORAL | Status: DC
  Administered 2022-03-29 – 2022-04-09 (×12): 75 mg via ORAL
  Filled 2022-03-28 (×12): qty 1

## 2022-03-28 MED ORDER — ACETAMINOPHEN 325 MG PO TABS
650.0000 mg | ORAL_TABLET | Freq: Four times a day (QID) | ORAL | Status: DC | PRN
  Administered 2022-03-29 – 2022-03-31 (×2): 650 mg via ORAL
  Filled 2022-03-28 (×2): qty 2

## 2022-03-28 MED ORDER — ONDANSETRON HCL 4 MG PO TABS: 4.0000 mg | ORAL_TABLET | Freq: Four times a day (QID) | ORAL | Status: DC | PRN

## 2022-03-28 MED ORDER — SODIUM CHLORIDE 0.9 % IV SOLN
2.0000 g | Freq: Once | INTRAVENOUS | Status: AC
  Administered 2022-03-28: 2 g via INTRAVENOUS
  Filled 2022-03-28: qty 12.5

## 2022-03-28 MED ORDER — LACTATED RINGERS IV SOLN: INTRAVENOUS | Status: DC

## 2022-03-28 MED ORDER — SODIUM CHLORIDE 0.9 % IV SOLN
2.0000 g | Freq: Two times a day (BID) | INTRAVENOUS | Status: DC
  Administered 2022-03-28: 2 g via INTRAVENOUS
  Filled 2022-03-28: qty 12.5

## 2022-03-28 MED ORDER — ONDANSETRON HCL 4 MG/2ML IJ SOLN
4.0000 mg | Freq: Four times a day (QID) | INTRAMUSCULAR | Status: DC | PRN
  Administered 2022-03-30 – 2022-04-09 (×6): 4 mg via INTRAVENOUS
  Filled 2022-03-28 (×6): qty 2

## 2022-03-28 MED ORDER — POLYETHYLENE GLYCOL 3350 17 G PO PACK: 17.0000 g | PACK | Freq: Every day | ORAL | Status: DC | PRN

## 2022-03-28 NOTE — H&P (Signed)
History and Physical    Patient: Jessica [name] DOB: 09-03-1933 DOA: 03/28/2022 DOS: the patient was seen and examined on 03/28/2022 PCP: [name], MD  Patient coming from: Home - lives alone; NOKJolayne Haines, [phone], [phone]   Chief Complaint: AMS  HPI: Jessica [name] is a 88 y.o. female with medical history significant of afib with pacemaker on Eliquis, chronic diastolic CHF, HTN, HLD, hypothyroidism, s/p TAVR, DM, and class 2 obesity presenting with AMS. The patient is able to provide some history - reports living alone, having back pain without headache or neurologic symptoms, mild cough, some abdominal pain with n/v.  However, she is perseverative in her speech at times and whlie she denies confusion is clearly having some.  She was able to confirm DNR status (I reviewed MOST form with her) but was unable to respond to questions about desire for interventions including pressors.    ER Course:  Very sick.  In bed more than usual, confused, vomiting.  Able to follow some commands.  ?meningitis, encephalitis.  Head CT with ?old SDH vs. Empyema.  Neurosurgery will see, recommends MRI (has pacer so if not repeat head CT).  Continue antibiotics, hold Eliquis.  Lactate 4.7.     Review of Systems: As mentioned in the history of present illness. All other systems reviewed and are negative.  Limited by some confusion.  Past Medical History:  Diagnosis Date   Anxiety    Atrial fibrillation and flutter ([place])    detected by PPM interrogation (mostly atrial flutter)   AV block, 2nd degree 10/2012   s/p MDT ADDRL1 pacemaker implantation 10/10/2012 by [name]   Chronic diastolic heart failure ([place])    Depression    GERD (gastroesophageal reflux disease)    HH (hiatus hernia)    History of pulmonary embolism 10/2008   Bilateral   Hyperlipidemia    Hypertension    Hypothyroidism    Obesity    Osteoarthritis    Peripheral neuropathy     Right leg   Presence of permanent cardiac pacemaker    Pulmonary nodule    in the lingula. Noted on pre TAVR CT. Will require follow up   Renal insufficiency 2010   S/P TAVR (transcatheter aortic valve replacement)    Shingles 09/17/2014   right lower quadrant   Type II or unspecified type diabetes mellitus without mention of complication, not stated as uncontrolled 2009   Past Surgical History:  Procedure Laterality Date   APPENDECTOMY     AV NODE ABLATION N/A 07/05/2017   Procedure: AV NODE ABLATION;  Surgeon: [name], MD;  Location: [facility];  Service: Cardiovascular;  Laterality: N/A;   CARPAL TUNNEL RELEASE     CATARACT EXTRACTION     CHOLECYSTECTOMY     COLONOSCOPY  2010   COLONOSCOPY WITH PROPOFOL N/A 07/25/2021   Procedure: COLONOSCOPY WITH PROPOFOL;  Surgeon: Mauri Pole, MD;  Location: [facility];  Service: Gastroenterology;  Laterality: N/A;   FOREARM FRACTURE SURGERY  09/17/2008   HERNIA REPAIR     INGUINAL HERNIA REPAIR     PACEMAKER INSERTION  10/10/2012   MDT ADDRL1 implanted for 2nd degree AV block by [name]   PERMANENT PACEMAKER INSERTION N/A 10/10/2012   Procedure: PERMANENT PACEMAKER INSERTION;  Surgeon: [name], MD;  Location: [facility];  Service: Cardiovascular;  Laterality: N/A;   RIGHT/LEFT HEART CATH AND CORONARY ANGIOGRAPHY N/A 12/30/2018   Procedure: RIGHT/LEFT HEART CATH AND CORONARY ANGIOGRAPHY;  Surgeon: [name], MD;  Location: [place] [facility];  Service: Cardiovascular;  Laterality: N/A;   ROTATOR CUFF REPAIR     TEE WITHOUT CARDIOVERSION N/A 01/14/2019   Procedure: TRANSESOPHAGEAL ECHOCARDIOGRAM (TEE);  Surgeon: [name], MD;  Location: [place] [facility];  Service: Open Heart Surgery;  Laterality: N/A;   TRANSCATHETER AORTIC VALVE REPLACEMENT, TRANSFEMORAL N/A 01/14/2019   Procedure: TRANSCATHETER AORTIC VALVE REPLACEMENT, TRANSFEMORAL;  Surgeon: [name], MD;   Location: [facility];  Service: Open Heart Surgery;  Laterality: N/A;   Social History:  reports that she has never smoked. She has never used smokeless tobacco. She reports that she does not drink alcohol and does not use drugs.  Allergies  Allergen Reactions   Coreg [Carvedilol] Other (See Comments)    Weak legs   Relafen [Nabumetone] Other (See Comments)    Upset stomach   Amiodarone Other (See Comments)    Patient reported intolerance to Amiodarone with worsened tremor and stopped pta. (hand tremors)   Atorvastatin     Myalgias   Calcium Other (See Comments)    unknown   Codeine Other (See Comments)    unknown   Rofecoxib Other (See Comments)    Unknown (vioxx)   Simvastatin     Myalgias   Enalapril Maleate Other (See Comments)    REACTION: cough    Family History  Problem Relation Age of Onset   Stroke Brother 51   Coronary artery disease Mother    Heart disease Mother 78   Stroke Father 82   Multiple myeloma Brother    Heart attack Son 15       Died of MI   Heart attack Daughter 41       Died of MI   Aneurysm Daughter 71       Question cerebral   Heart disease Maternal Grandmother    Heart disease Maternal Grandfather    Peripheral vascular disease Daughter        Amputation secondary to DM    Prior to Admission medications   Medication Sig Start Date End Date Taking? Authorizing Provider  allopurinol (ZYLOPRIM) 100 MG tablet TAKE 1 TABLET BY MOUTH DAILY 02/13/22   Plotnikov, Evie Lacks, MD  apixaban (ELIQUIS) 5 MG TABS tablet Take 1 tablet (5 mg total) by mouth 2 (two) times daily. 09/20/21   Vickie Epley, MD  Ascorbic Acid (VITAMIN C PO) Take 1 tablet by mouth daily.    [provider]  b complex vitamins tablet Take 1 tablet by mouth daily.    [provider]  Blood Glucose Calibration (OT ULTRA/FASTTK CNTRL SOLN) SOLN Use as directed 07/17/19   Plotnikov, Evie Lacks, MD  Blood Glucose Monitoring Suppl (ONE TOUCH ULTRA 2)  w/Device KIT Use as instructed to check blood sugar BID 07/17/19   Plotnikov, Evie Lacks, MD  Cholecalciferol 1000 UNITS tablet Take 1,000 Units by mouth daily.    [provider]  ezetimibe (ZETIA) 10 MG tablet TAKE 1 TABLET BY MOUTH DAILY 02/13/22   Vickie Epley, MD  furosemide (LASIX) 40 MG tablet TAKE 1 TABLET BY MOUTH TWICE  DAILY 02/13/22   Plotnikov, Evie Lacks, MD  glucose blood (ONETOUCH ULTRA) test strip Use as instructed to check blood sugar BID 07/17/19   Plotnikov, Evie Lacks, MD  Lancets [facility] ULTRASOFT) lancets Use as instructed to check blood sugar BID 07/17/19   Plotnikov, Evie Lacks, MD  levothyroxine (SYNTHROID) 75 MCG tablet TAKE 1  TABLET BY MOUTH DAILY 02/13/22   Plotnikov, Evie Lacks, MD  LORazepam (ATIVAN) 0.5 MG tablet TAKE 1 TABLET BY MOUTH TWICE  DAILY AS NEEDED FOR ANXIETY 03/09/22   Plotnikov, Evie Lacks, MD  metFORMIN (GLUCOPHAGE) 500 MG tablet TAKE 1 TABLET BY MOUTH TWICE  DAILY WITH MEALS 02/13/22   Plotnikov, Evie Lacks, MD  metoprolol succinate (TOPROL-XL) 50 MG 24 hr tablet TAKE 1 TABLET BY MOUTH  DAILY WITH OR IMMEDIATELY  FOLLOWING A MEAL 11/07/21   Plotnikov, Evie Lacks, MD  Multiple Vitamin (MULTIVITAMIN WITH MINERALS) TABS tablet Take 1 tablet by mouth daily. Women's Research scientist (life sciences), Historical, MD  Multiple Vitamins-Minerals (PRESERVISION AREDS 2 PO) Take 1 tablet by mouth 2 (two) times daily.    [provider]  Polyethyl Glycol-Propyl Glycol (LUBRICANT EYE DROPS) 0.4-0.3 % SOLN Place 1 drop into both eyes 4 (four) times daily as needed (dry/irritated eyes.).    [provider]  potassium chloride SA (KLOR-CON M) 20 MEQ tablet TAKE 1 TABLET BY MOUTH DAILY 02/13/22   Plotnikov, Evie Lacks, MD  pramoxine-hydrocortisone (ANALPRAM HC) cream Apply topically in the morning and at bedtime. Use prn qhs 12/26/21   Plotnikov, Evie Lacks, MD  repaglinide (PRANDIN) 2 MG tablet TAKE 2 TABLETS BY MOUTH 3  TIMES DAILY BEFORE MEALS 11/07/21   Plotnikov,  Evie Lacks, MD  rosuvastatin (CRESTOR) 10 MG tablet TAKE 1 TABLET BY MOUTH UP TO 3  TIMES WEEKLY AS TOLERATED 11/14/21   Skeet Latch, MD  sacubitril-valsartan (ENTRESTO) 97-103 MG Take 1 tablet by mouth 2 (two) times daily. 03/07/22   Skeet Latch, MD  temazepam (RESTORIL) 15 MG capsule TAKE 1 CAPSULE BY MOUTH EVERY NIGHT AT BEDTIME AS NEEDED 03/21/22   Plotnikov, Evie Lacks, MD  venlafaxine XR (EFFEXOR-XR) 75 MG 24 hr capsule TAKE 1 CAPSULE BY MOUTH  DAILY WITH BREAKFAST 05/02/21   Plotnikov, Evie Lacks, MD    Physical Exam: Vitals:   03/28/22 1445 03/28/22 1500 03/28/22 1554 03/28/22 1700  BP:   (!) 88/55 (!) 85/43  Pulse: (!) 112 (!) 102 (!) 105 (!) 117  Resp: (!) 32 (!) 28 (!) 21 19  Temp:   97.7 F (36.5 C)   TempSrc:   Oral   SpO2: 90% 91% 92% 94%  Weight:      Height:       General:  Appears ill, somewhat confused Eyes:  EOMI, normal lids, iris ENT:  grossly normal hearing, lips & tongue, mildly dry mm Neck:  no LAD, masses or thyromegaly Cardiovascular:  RR with tachycardia, no m/r/g. No LE edema.  Respiratory:   Scattered rhonchi.  Mildly increased respiratory effort on 3L [place] O2. Abdomen:  soft, N?mildly TTP ND Skin:  no rash or induration seen on limited exam Musculoskeletal:  grossly normal tone BUE/BLE, good ROM, no bony abnormality Psychiatric:  blunted/confused mood and affect, speech fluent and appropriate at times and perseverative at other times Neurologic:  CN 2-12 grossly intact, moves all extremities in coordinated fashion   Radiological Exams on Admission: Independently reviewed - see discussion in A/P where applicable  CT ANGIO HEAD NECK W WO CM  Result Date: 03/28/2022 CLINICAL DATA:  Provided history: Altered mental status, septic, right lower extremity weakness. EXAM: CT ANGIOGRAPHY HEAD AND NECK TECHNIQUE: Multidetector CT imaging of the head and neck was performed using the standard protocol during bolus administration of intravenous contrast.  Multiplanar CT image reconstructions and MIPs were obtained to evaluate the vascular anatomy. Carotid stenosis measurements (  when applicable) are obtained utilizing NASCET criteria, using the distal internal carotid diameter as the denominator. RADIATION DOSE REDUCTION: This exam was performed according to the departmental dose-optimization program which includes automated exposure control, adjustment of the mA and/or kV according to patient size and/or use of iterative reconstruction technique. CONTRAST:  50m OMNIPAQUE IOHEXOL 350 MG/ML SOLN COMPARISON:  Report from brain MRI 03/23/2002 (images unavailable). FINDINGS: CT HEAD FINDINGS Brain: Frontal predominant cerebral atrophy. Questionable intermediate to low-density subdural collection overlying the left cerebral hemisphere, measuring up to 6 mm in thickness (for instance as seen on series 8, image 34). No more than mild mass effect upon the underlying left cerebral hemisphere. No midline shift. Mild patchy and ill-defined hypoattenuation within the cerebral white matter, nonspecific but compatible with chronic small vessel ischemic disease. No demarcated cortical infarct. No evidence of an intracranial mass. Vascular: No hyperdense vessel. Atherosclerotic calcifications. Skull: No fracture or aggressive osseous lesion. Sinuses/Orbits: No mass or acute finding within the imaged orbits. Trace mucosal thickening scattered within the paranasal sinuses. Other: 10 mm lucent lesion within the anterior maxilla at midline. This may reflect an incisive canal cyst or a residual cyst. Review of the MIP images confirms the above findings CTA NECK FINDINGS Aortic arch: Standard aortic branching. Atherosclerotic plaque within the visualized aortic arch and proximal major branch vessels of the neck No hemodynamically significant innominate or proximal subclavian artery stenosis. Right carotid system: CCA and ICA patent within the neck without stenosis. Mild atherosclerotic  plaque within the mid to distal CCA and about the carotid bifurcation. Left carotid system: CCA and ICA patent within the neck without hemodynamically significant stenosis (50% or greater). Atherosclerotic plaque, greatest about the carotid bifurcation and within the proximal ICA. Vertebral arteries: Vertebral arteries codominant and patent within the neck without hemodynamically significant stenosis. Nonstenotic atherosclerotic plaque at the origin of the right vertebral artery. Skeleton: Cervical spondylosis. Bilateral facet joint ankylosis at C2-C3. No acute fracture or aggressive osseous lesion. Other neck: No neck mass or cervical lymphadenopathy. Upper chest: No consolidation within the imaged lung apices. Review of the MIP images confirms the above findings CTA HEAD FINDINGS Anterior circulation: The intracranial internal carotid arteries are patent. Atherosclerotic plaque within both vessels without stenosis. The M1 middle cerebral arteries are patent. No M2 proximal branch occlusion or high-grade proximal stenosis. The anterior cerebral arteries are patent. No intracranial aneurysm is identified. Posterior circulation: The intracranial vertebral arteries are patent. Sclerotic plaque within the left V4 segment without stenosis. The basilar artery is patent. The posterior cerebral arteries are patent. A right posterior communicating artery is present. The left posterior communicating artery is diminutive or absent. Venous sinuses: Within the limitations of contrast timing, no convincing thrombus. Anatomic variants: As described. Review of the MIP images confirms the above findings CT head impression #1 called by telephone at the time of interpretation on 03/28/2022 at 2:26 pm to provider VGeorgina Snell, who verbally acknowledged these results. IMPRESSION: CT head: 1. Questionable intermediate to low-density subdural collection overlying the left cerebral hemisphere (measuring up to 6 mm). This may reflect  artifact or a subacute-to-chronic subdural hematoma. However, given the provided history of sepsis, a subdural empyema cannot be excluded. Consider a brain MRI without and with contrast for further evaluation, if the patient is able to have one. No more than mild mass effect upon the underlying left cerebral hemisphere. No midline shift. 2. Mild chronic small-vessel ischemic changes within the cerebral white matter. 3. Frontal predominant cerebral atrophy. CTA neck:  1. The common carotid, internal carotid and vertebral arteries are patent within the neck without hemodynamically significant stenosis. Atherosclerotic plaque within these vessels, as described. 2.  Aortic Atherosclerosis (ICD10-I70.0). CTA head: Intracranial atherosclerotic disease, as described. No intracranial large vessel occlusion or proximal high-grade arterial stenosis identified. Electronically Signed   By: Kellie Simmering D.O.   On: 03/28/2022 14:28   CT ABDOMEN PELVIS W CONTRAST  Result Date: 03/28/2022 CLINICAL DATA:  Altered mental status. Sepsis. Right lower extremity weakness. EXAM: CT ABDOMEN AND PELVIS WITH CONTRAST TECHNIQUE: Multidetector CT imaging of the abdomen and pelvis was performed using the standard protocol following bolus administration of intravenous contrast. RADIATION DOSE REDUCTION: This exam was performed according to the departmental dose-optimization program which includes automated exposure control, adjustment of the mA and/or kV according to patient size and/or use of iterative reconstruction technique. CONTRAST:  33m OMNIPAQUE IOHEXOL 350 MG/ML SOLN COMPARISON:  01/07/2019 FINDINGS: Lower chest: Mild patchy density at the lung bases that could be atelectasis or minimal basilar pneumonia. Hiatal hernia is present. 8 mm nodule previously seen in the lingula is unchanged since October of 21610and therefore certainly benign. Hepatobiliary: Liver parenchyma is normal. Previous cholecystectomy. Pancreas: Normal Spleen:  Normal Adrenals/Urinary Tract: Adrenal glands are normal. Right kidney is normal. Left kidney is normal except for a nonobstructing 3 mm stone in the lower pole. No hydroureteronephrosis. No stone in the bladder Stomach/Bowel: Hiatal hernia as noted above. No acute gastric finding. Small-bowel is normal. No acute colon finding. Diverticulosis of the left colon without visible diverticulitis. Vascular/Lymphatic: Aortic atherosclerosis. No aneurysm. IVC is normal. No adenopathy Reproductive: No pelvic mass of significance. Small uterine leiomyomas. Other: No free fluid or air. Musculoskeletal: Ordinary spinal degenerative changes. Osteoarthritis of both hips. IMPRESSION: 1. No acute abdominal or pelvic finding. 2. Mild patchy density at the lung bases that could be atelectasis or minimal basilar pneumonia. 3. Hiatal hernia. 4. Previous cholecystectomy. 5. 3 mm nonobstructing stone in the lower pole of the left kidney. 6. Diverticulosis of the left colon without visible diverticulitis. 7. Aortic atherosclerosis. Aortic Atherosclerosis (ICD10-I70.0). Electronically Signed   By: MNelson ChimesM.D.   On: 03/28/2022 14:00   DG Chest Port 1 View  Result Date: 03/28/2022 CLINICAL DATA:  Sepsis. EXAM: PORTABLE CHEST 1 VIEW COMPARISON:  January 14, 2019. FINDINGS: Stable cardiomediastinal silhouette. Left-sided pacemaker is unchanged in position. Mild central pulmonary vascular congestion is noted. Status post transcatheter aortic valve repair. Mild bibasilar subsegmental atelectasis is noted. Bony thorax is unremarkable. IMPRESSION: Stable cardiomediastinal silhouette with probable mild central pulmonary vascular congestion. Mild bibasilar subsegmental atelectasis. Electronically Signed   By: JMarijo ConceptionM.D.   On: 03/28/2022 11:11    EKG: Independently reviewed.  Sinus tachycardia with rate 103; prolonged QTC 641; diffuse ST changes concerning for ischemia but similar to prior   Labs on Admission: I have  personally reviewed the available labs and imaging studies at the time of the admission.  Pertinent labs:    Glucose 319 BUN 17/Creatinine 1.31/GFR 40; 24/0.94/55 on 9/25 AST 58 BNP 774.3 HS troponin 43, 65 WBC 13.3 INR 1.5 Lactate 4.7, 3.7   Assessment and Plan: Principal Problem:   Severe sepsis with acute organ dysfunction (HCC) Active Problems:   Hypothyroidism   DM type 2, controlled, with complication ([place]   HLD (hyperlipidemia)   OBESITY, MORBID   Anxiety disorder   Essential hypertension   Chronic diastolic heart failure (HCC)   Atrial fibrillation and flutter (HCC)   Presence  of permanent cardiac pacemaker   Altered mental status   AKI (acute kidney injury) ([place])   DNR (do not resuscitate)    Severe sepsis -Sepsis indicates life-threatening organ dysfunction with mortality >10%, caused by dysregulation to host response.   -SIRS criteria in this patient includes: Leukocytosis, fever, tachycardia, tachypnea  -Patient has evidence of acute organ failure with elevated lactate >2; encephalopathy; recurrent hypotension (SBP < 90 or MAP < 65 x 2 readings) that is not easily explained by another condition. -While awaiting blood cultures, this appears to be a preseptic condition. -Sepsis protocol initiated -Suspected source is uncertain - PNA vs. Brain empyema associated with SDH -Blood and urine cultures pending -Will admit due to: hemodynamic instability; hypoxemia; AMS that is severe or persistent -Treat with IV Flagyl/Cefepime/Vanc for undifferentiated sepsis -Will trend lactate to ensure improvement -Will order procalcitonin level.  Antibiotics would not be indicated for PCT <0.1 and probably should not be used for < 0.25.  >0.5 indicates infection and >>0.5 indicates more serious disease.  As the procalcitonin level normalizes, it will be reasonable to consider de-escalation of antibiotic coverage. -This patient is at risk for shock and may require vasopressors to  keep MAP >65 and/or due to lactate >2 despite volume resuscitation; shock is associated with >40% mortality. -PCCM has been consulted  SDH, ?empyema -CT with concern for subacute to chronic SDH with ?empyema -With AMS and fever, meningitis/encephalitis is a consideration -Antibiotics as above -Neurosurgery is consulting -Will order MRI but if pacemaker is not compatible then she will need repeat head CT in 48 hours per neurosurgery  AKI on stage 3a CKD -Likely related to hypovolemia in the setting of severe sepsis -Hydrate and follow -Avoid nephrotoxic agents when possible  Afib -Hold Eliquis given SDH -Has pacemaker -Rate controlled with Toprol XL (on hold for now given hypotension)  Chronic combined CHF -Appears dry for now -Hold Entresto  HTN -Hypotensive due to sepsis -Hold metoprolol, Entresto  HLD -Hold Zetia, rosuvastatin until more alert  Hypothyroidism -Continue Synthroid  DM -Recent A1c was 10.0, indicating poor control -hold Glucophage, Prandin -Cover with sensitive-scale SSI   Psychiatric disease -Resume Effexor if able to tolerate -Hold Restoril, lorazepam  Class 2 Obesity -Body mass index is 39.94 kg/m..  -Weight loss should be encouraged -Outpatient PCP/bariatric medicine f/u encouraged   DNR -I have discussed code status with the patient and she agreed with her MOST form, that she would not desire resuscitation and would prefer to die a natural death should that situation arise.    Total critical care time: 55 minutes Critical care time was exclusive of separately billable procedures and treating other patients. Critical care was necessary to treat or prevent imminent or life-threatening deterioration. Critical care was time spent personally by me on the following activities: development of treatment plan with patient and/or surrogate as well as nursing, discussions with consultants, evaluation of patient's response to treatment, examination of  patient, obtaining history from patient or surrogate, ordering and performing treatments and interventions, ordering and review of laboratory studies, ordering and review of radiographic studies, pulse oximetry and re-evaluation of patient's condition.     Advance Care Planning:   Code Status: DNR   Consults: PCCM; neurosurgery  DVT Prophylaxis: SCDs  Family Communication: None present; Dr. Lake Bells spoke with family by telephone at the time of PCCM consult  Severity of Illness: The appropriate patient status for this patient is INPATIENT. Inpatient status is judged to be reasonable and necessary in order to provide  the required intensity of service to ensure the patient's safety. The patient's presenting symptoms, physical exam findings, and initial radiographic and laboratory data in the context of their chronic comorbidities is felt to place them at high risk for further clinical deterioration. Furthermore, it is not anticipated that the patient will be medically stable for discharge from the hospital within 2 midnights of admission.   * I certify that at the point of admission it is my clinical judgment that the patient will require inpatient hospital care spanning beyond 2 midnights from the point of admission due to high intensity of service, high risk for further deterioration and high frequency of surveillance required.*  Author: Karmen Bongo, MD 03/28/2022 6:49 PM  For on call review [email or web address].

## 2022-03-28 NOTE — ED Notes (Signed)
MRI called at this time to be told that patient has non conditional pacemaker and is unable to go through MRI scan.

## 2022-03-28 NOTE — Progress Notes (Signed)
PCCM Interval Progress Note  Received call from day team regarding patient's ongoing hypotension. Despite fluid resuscitation, patient BP remains soft with SBO 80s-90s with MAPs 50s-low 60s.  Will begin peripheral Levophed and place CCM/ICU admission orders.  Lestine Mount, PA-C [facility] 03/28/22 8:01 PM  Please see [email or web address] for pager details.  From 7A-7P if no response, please call [phone] After hours, please call ELink [phone]

## 2022-03-28 NOTE — ED Notes (Signed)
Help get patient cleaned up placed a brief did rectal temperature it was 104 patient is resting with call bell in reach

## 2022-03-28 NOTE — ED Notes (Signed)
Patient Jessica [name] Hence [phone]) or [phone] states call him when patient is being discharge. He will pick her up.

## 2022-03-28 NOTE — ED Notes (Signed)
Got patient undressed on the monitor did EKG shown to er provider patient is resting with call bell in reach

## 2022-03-28 NOTE — ED Provider Notes (Signed)
[facility] EMERGENCY DEPARTMENT Provider Note   CSN: [id number] Arrival date & time: 03/28/22  1022     History  No chief complaint on file.   Jessica [name] is a 88 y.o. female.  With PMH chronic diastolic heart failure, A-fib on Eliquis, HTN, HLD, CKD, thyroid disease, depression BIB EMS from home with altered mental status.  Patient has been having nausea and vomiting with foul-smelling urine and found to be febrile with EMS 101.7 F temperature, heart rate 110, BP 100/50, respiratory rate 31 and glucose 406.  EMS gave patient 500 cc IV fluids and Tylenol.  Patient is slightly encephalopathic but able to communicate and answer some questions and provide some history.  She says she has been feeling unwell for a few days and has been complaining of nausea and vomiting and decreased p.o. intake.  She is also complaining of cough that is been nonproductive.  According to roommate who called EMS, patient typically AOx4 and ambulatory on her own but has been in bed for the past 3 days and not as active as usual.  HPI     Home Medications Prior to Admission medications   Medication Sig Start Date End Date Taking? Authorizing Provider  allopurinol (ZYLOPRIM) 100 MG tablet TAKE 1 TABLET BY MOUTH DAILY 02/13/22   Plotnikov, Evie Lacks, MD  apixaban (ELIQUIS) 5 MG TABS tablet Take 1 tablet (5 mg total) by mouth 2 (two) times daily. 09/20/21   Vickie Epley, MD  Ascorbic Acid (VITAMIN C PO) Take 1 tablet by mouth daily.    [provider]  b complex vitamins tablet Take 1 tablet by mouth daily.    [provider]  Blood Glucose Calibration (OT ULTRA/FASTTK CNTRL SOLN) SOLN Use as directed 07/17/19   Plotnikov, Evie Lacks, MD  Blood Glucose Monitoring Suppl (ONE TOUCH ULTRA 2) w/Device KIT Use as instructed to check blood sugar BID 07/17/19   Plotnikov, Evie Lacks, MD  Cholecalciferol 1000 UNITS tablet Take 1,000 Units by mouth daily.    [provider]  ezetimibe (ZETIA) 10 MG tablet TAKE 1 TABLET BY MOUTH DAILY 02/13/22   Vickie Epley, MD  furosemide (LASIX) 40 MG tablet TAKE 1 TABLET BY MOUTH TWICE  DAILY 02/13/22   Plotnikov, Evie Lacks, MD  glucose blood (ONETOUCH ULTRA) test strip Use as instructed to check blood sugar BID 07/17/19   Plotnikov, Evie Lacks, MD  Lancets [facility] ULTRASOFT) lancets Use as instructed to check blood sugar BID 07/17/19   Plotnikov, Evie Lacks, MD  levothyroxine (SYNTHROID) 75 MCG tablet TAKE 1 TABLET BY MOUTH DAILY 02/13/22   Plotnikov, Evie Lacks, MD  LORazepam (ATIVAN) 0.5 MG tablet TAKE 1 TABLET BY MOUTH TWICE  DAILY AS NEEDED FOR ANXIETY 03/09/22   Plotnikov, Evie Lacks, MD  metFORMIN (GLUCOPHAGE) 500 MG tablet TAKE 1 TABLET BY MOUTH TWICE  DAILY WITH MEALS 02/13/22   Plotnikov, Evie Lacks, MD  metoprolol succinate (TOPROL-XL) 50 MG 24 hr tablet TAKE 1 TABLET BY MOUTH  DAILY WITH OR IMMEDIATELY  FOLLOWING A MEAL 11/07/21   Plotnikov, Evie Lacks, MD  Multiple Vitamin (MULTIVITAMIN WITH MINERALS) TABS tablet Take 1 tablet by mouth daily. Women's Research scientist (life sciences), Historical, MD  Multiple Vitamins-Minerals (PRESERVISION AREDS 2 PO) Take 1 tablet by mouth 2 (two) times daily.    [provider]  Polyethyl Glycol-Propyl Glycol (LUBRICANT EYE DROPS) 0.4-0.3 % SOLN Place 1 drop into both eyes 4 (four) times daily as needed (  dry/irritated eyes.).    [provider]  potassium chloride SA (KLOR-CON M) 20 MEQ tablet TAKE 1 TABLET BY MOUTH DAILY 02/13/22   Plotnikov, Evie Lacks, MD  pramoxine-hydrocortisone (ANALPRAM HC) cream Apply topically in the morning and at bedtime. Use prn qhs 12/26/21   Plotnikov, Evie Lacks, MD  repaglinide (PRANDIN) 2 MG tablet TAKE 2 TABLETS BY MOUTH 3  TIMES DAILY BEFORE MEALS 11/07/21   Plotnikov, Evie Lacks, MD  rosuvastatin (CRESTOR) 10 MG tablet TAKE 1 TABLET BY MOUTH UP TO 3  TIMES WEEKLY AS TOLERATED 11/14/21   Skeet Latch, MD  sacubitril-valsartan  (ENTRESTO) 97-103 MG Take 1 tablet by mouth 2 (two) times daily. 03/07/22   Skeet Latch, MD  temazepam (RESTORIL) 15 MG capsule TAKE 1 CAPSULE BY MOUTH EVERY NIGHT AT BEDTIME AS NEEDED 03/21/22   Plotnikov, Evie Lacks, MD  venlafaxine XR (EFFEXOR-XR) 75 MG 24 hr capsule TAKE 1 CAPSULE BY MOUTH  DAILY WITH BREAKFAST 05/02/21   Plotnikov, Evie Lacks, MD      Allergies    Coreg [carvedilol], Relafen [nabumetone], Amiodarone, Atorvastatin, Calcium, Codeine, Rofecoxib, Simvastatin, and Enalapril maleate    Review of Systems   Review of Systems  Physical Exam Updated Vital Signs There were no vitals taken for this visit. Physical Exam Constitutional: Encephalopathic and confused female oriented to person and age, ill-appearing Eyes: Conjunctivae are normal. ENT      Head: Normocephalic and atraumatic.      Nose: No congestion.      Mouth/Throat: Mucous membranes are severely dry with dried black residual color on tongue.      Neck: No stridor. Cardiovascular: S1, S2, tachycardic, regular rhythm, equal palpable DP pulses.Warm and well perfused. Respiratory: Tachypneic, rhonchorous breath sounds, O2 sat 90 to 93% on room air Gastrointestinal: Soft and nontender.  Patient in diaper that is soaked with foul-smelling malodorous urine Musculoskeletal:  Trays equal nontender pitting edema bilateral lower extremities extending from ankle to knee Neurologic: Global weakness.  No drift of upper extremities.  Will not move right lower extremity against gravity but reports pain and injury.  Left lower extremity able to be held against gravity.  Sensation grossly intact.  Mild dysarthria.  No facial droop. PERRL, encephalopathic Skin: Skin is warm, dry  Psychiatric: Mood and affect are normal. Speech and behavior are normal.  ED Results / Procedures / Treatments   Labs (all labs ordered are listed, but only abnormal results are displayed) Labs Reviewed - No data to  display  EKG None  Radiology No results found.  Procedures .Critical Care  Performed by: Elgie Congo, MD Authorized by: Elgie Congo, MD   Critical care provider statement:    Critical care time (minutes):  75   Critical care was necessary to treat or prevent imminent or life-threatening deterioration of the following conditions:  Sepsis and respiratory failure   Critical care was time spent personally by me on the following activities:  Development of treatment plan with patient or surrogate, discussions with consultants, evaluation of patient's response to treatment, examination of patient, ordering and review of laboratory studies, ordering and review of radiographic studies, ordering and performing treatments and interventions, pulse oximetry, re-evaluation of patient's condition, review of old charts and obtaining history from patient or surrogate   Care discussed with: admitting provider       Medications Ordered in ED Medications - No data to display  ED Course/ Medical Decision Making/ A&P Clinical Course as of 03/28/22 1554  Tue Mar 28, 2022  1426 Spoke with on-call radiologist concern for subacute to chronic subdural hematoma versus possible empyema considering patient is septic and altered.  Patient has a pacemaker limiting ability to get MRI.  She is being covered for sepsis with vancomycin and cefepime.  Will also add on Flagyl.  Both neurology and neurosurgery consulted. [VB]  4401 Spoke with Dr. Newman Pies who has reviewed patient's imaging of the head.  He suspects is more likely consistent with subacute/older subdural hematoma.  He says it is less likely to be an empyema as there is no evidence of surrounding edema or ventricular involvement which typically you would see with empyema.  His NP/PA Jinny Blossom will be seeing patient and putting in formal consult but likely will recommend a repeat head CT since patient likely cannot get MRI with and without  contrast due to her pacemaker/ICD.  He also recommends if she is able to get MRI to wait until she is more stable.  Continue antibiotics.  Hold Eliquis. [VB]  80 Spoke with Dr. Lorin Mercy hospitalist who is requesting at least ICU consult due to elevated lactate and concerns for empyema and severe sepsis.  Will put in consult. [VB]    Clinical Course User Index [VB] Elgie Congo, MD                           Medical Decision Making ETHLYN ALTO is a 88 y.o. female.  With PMH chronic diastolic heart failure, A-fib on Eliquis, HTN, HLD, CKD, thyroid disease, depression BIB EMS from home with altered mental status.  Patient has been having nausea and vomiting with foul-smelling urine and found to be febrile with EMS 101.7 F temperature, heart rate 110, BP 100/50, respiratory rate 31 and glucose 406.  EMS gave patient 500 cc IV fluids and Tylenol.  Patient presents septic with a fever of 104 F with tachycardia, tachypnea and hypoxia satting high 80s on room air.  She is encephalopathic on exam.  Her exam is concerning for sepsis secondary to possible multiple different etiologies including but not limited to meningitis, encephalitis, UTI/pyelonephritis, bacterial pneumonia among multiple other etiologies.  She is globally weak on exam but more weak on right lower extremity however complaining of pain however no facial droop and no drift of upper extremities, not consistent with acute stroke.  Sepsis workup initiated with blood cultures and imaging including CTA head and neck due to altered mental status and weakness of the right lower extremity.  Her white blood cell count was 13.3 with left shift as well as elevated lactate 4.7.  No hypercarbia on VBG.  Anion gap acidosis with bicarbonate 20 likely secondary to lactic acidosis.  Creatinine 1.31.  CTAP with IV contrast obtained which showed evidence of patchy density at bilateral lower lobes concerning for possible pneumonia.  She also had chest  x-ray which I personally reviewed which had bilateral interstitial infiltrates concerning for pulmonary edema.  She is currently on 2 L oxygen.  UA without UTI.   Oxygen requirement could be secondary to pulmonary edema versus possible bacterial pneumonia as noted on CTAP with contrast of the lower lobes.  Held off from full fluid bolus for sepsis due to concern for possible pulmonary edema with new oxygen requirement.  She has been covered broadly with vancomycin, cefepime and Flagyl.  CTA head and neck obtained which I spoke to on-call radiologist who said there looks to be a subacute or chronic subdural  hematoma versus possible empyema due to septic appearance.  Spoke with Dr. Newman Pies who has reviewed patient's imaging of the head.  He suspects is more likely consistent with subacute/older subdural hematoma.  He says it is less likely to be an empyema as there is no evidence of surrounding edema or ventricular involvement which typically you would see with empyema.  His NP/PA Jinny Blossom will be seeing patient and putting in formal consult but likely will recommend a repeat head CT since patient likely cannot get MRI with and without contrast due to her pacemaker/ICD.  He also recommends if she is able to get MRI to wait until she is more stable.  Continue antibiotics.  Hold Eliquis.  Spoke with Dr. Lorin Mercy who requests at least an ICU consult due to concern for possible empyema and elevated lactate.  I have put in an ICU consult.  Likely will still be admitted to stepdown under hospitalist.   Amount and/or Complexity of Data Reviewed Labs: ordered. Radiology: ordered. ECG/medicine tests: ordered.  Risk Prescription drug management. Decision regarding hospitalization.   Final Clinical Impression(s) / ED Diagnoses Final diagnoses:  None    Rx / DC Orders ED Discharge Orders     None         Elgie Congo, MD 03/28/22 1555

## 2022-03-28 NOTE — Consult Note (Addendum)
NAME:  Jessica [name], MRN:  [id number], DOB:  [birth year], LOS: 0 ADMISSION DATE:  03/28/2022, CONSULTATION DATE:  03/28/22 REFERRING MD:  Lorin Mercy CHIEF COMPLAINT:  AMS   History of Present Illness:  Jessica [name] is a 88 y.o. female who has a PMH as below. She presented to [facility] ED 12/26 with AMS. She had not been feeling well for a few days prior to this and had decreased PO intake, N/V, and dysuria. Due to not feeling well, she has essentially been bed ridden for the past few days.  In ED, she was febrile to 101.7 and had soft blood pressures with systolics in the 77L. One charting does show a rectal temp of 104 and repeat oral temp 3 hours later was 97.7. She received 1.5L NS bolus and additional fluids were withheld due to concern for pulmonary edema and new O2 requirement. Her BP had minimal improvement; therefore, PCCM asked to see in consultation. She had CT of abd/pelv which was negative for any acute findings and showed possible atelectasis vs basilar PNA. She also had CT and CTA head/neck which demonstrated questionable low density subdural collection over the left cerebral hemisphere along with mild chronic small vessel ischemic changes. Neurosurgery was consulted and felt that her imaging represented a chronic SDH with subdural empyema being less likely given the fact that she did not have any marked edema or mass effect or any ventricular involvement. They recommended holding Eliquis and repeating head CT in 48 hours. She is not able to get an MRI due to her pacemaker.  At the time of our evaluation, her SBP ranges from high 70s to high 80s. She is awake and tells me "I feel ok but not great". She endorses decreased PO intake for about 2 days and nausea with vomiting. She has very dry lips and mucous membranes and it is difficult for her to articulate clearly. She is able to move all extremities against resistance and is equal bilaterally.  Pertinent  Medical History:  has  Hypothyroidism; DM type 2, controlled, with complication ([place]); HLD (hyperlipidemia); OBESITY, MORBID; Anxiety disorder; Grieving; CARPAL TUNNEL SYNDROME; Essential hypertension; DUODENITIS, WITHOUT HEMORRHAGE; HIATAL HERNIA; Diverticulosis of colon without hemorrhage; Osteoarthritis; Low back pain; GERD (gastroesophageal reflux disease); Foot pain, left; Gastrocnemius strain; Left carotid artery stenosis; Paronychia of finger, left; Erythema nodosum; Chronic venous insufficiency; DOE (dyspnea on exertion); Chronic diastolic heart failure ([place]); Atrial fibrillation and flutter ([place]); Severe aortic stenosis; History of pulmonary embolism; Presence of permanent cardiac pacemaker; Pulmonary nodule; S/P TAVR (transcatheter aortic valve replacement); Vertigo; Eczema; Fall; Knee contusion; Pure hypercholesterolemia; and Rectal bleeding on their problem list.  Significant Hospital Events: Including procedures, antibiotic start and stop dates in addition to other pertinent events   12/26 admit.  Interim History / Subjective:  Soft BPs with systolics in 39Q and 30S. Very dry mucous membranes and skin with tenting. Lips and tongue dry enough that she is not able to articulate clearly. She does move all extremities against resistance and strength is equal bilaterally.  Objective:  Blood pressure (!) 88/55, pulse (!) 105, temperature 97.7 F (36.5 C), temperature source Oral, resp. rate (!) 21, height _0  (1.651 m), weight 108.9 kg, SpO2 92 %.        Intake/Output Summary (Last 24 hours) at 03/28/2022 1706 Last data filed at 03/28/2022 1655 Gross per 24 hour  Intake 2700 ml  Output --  Net 2700 ml   Filed Weights   03/28/22 1043  Weight: 108.9  kg    Examination: General: Elderly female, chronically ill appearing, resting in bed, in NAD. Neuro: Awake, able to tell me her name, where she, what month it is, is but intermittently speaks of things off topic. Does follow commands. HEENT: [place]/AT. Sclerae  anicteric. MM extremely dry with cracked lips. Cardiovascular: IRIR, no M/R/G.  Lungs: Respirations even and unlabored.  CTA bilaterally, No W/R/R. Abdomen: BS x 4, soft, NT/ND.  Musculoskeletal: No gross deformities, no edema.  Skin: Intact, warm, no rashes.  Labs/imaging personally reviewed:  CT/CTA head and neck 12/26 > questionable low density subdural collection over the left cerebral hemisphere along with mild chronic small vessel ischemic changes. CT abd/pelv 12/26 > negative for any acute findings and showed possible atelectasis vs basilar PNA. CT head 12/28 >   Assessment & Plan:   Sepsis - unclear etiology but suspect large explanation for her hypotension is primarily hypovolemia related given decreased PO intake with N/V for the past few days. She is now s/p 1.5L NS bolus in ED. Inially felt urosepsis; however, UA is non-revealing. - Additional 2L NS now. - 1 time dose 12.5g of 25% Albumin. - Empiric abx to continue for now. - Follow cultures. - Follow lactate. - Add PCT.  Acute metabolic encephalopathy - presumed 2/2 above. - Supportive care. - Limit sedating meds. - Repeat CT head in 48 hours (see below).  Hyponatremia - hypovolemic. AKI - suspect pre-renal. Hypocalcemia. - Additional fluids as above. - 1g Ca Gluconate. - Follow BMP.  Hx DM. - Fluids and SSI. - Hold home Metformin.  Presumed chronic SDH - evaluated by neurosurgery, felt to be SDH and less likely subdural empyema. - Hold Eliquis. - Repeat head CT in 48 hours.  Hx A.fib, 2nd degree AV block s/p PPM 2014, HTN, HLD, s/p TAVR - Hold Eliquis as above. - Hold home Furosemide, Toprol-XL, Entresto.  Hx Hypothyroidism. - Check TSH. - Continue home Synthroid.  Rest per TRH. OK for SDU admission under [place] currently. Needs aggressive fluid resuscitation as she is very volume deplete. Have ordered 2L NS for now along with one Albumin. Continue maintenance IVF thereafter. If continues to have  hypotension, will change to ICU status and re-evaluate for additional fluids vs vasopressors.   Best practice (evaluated daily):  Per TR[facility].  Labs   CBC: Recent Labs  Lab 03/28/22 1058 03/28/22 1123  WBC  --  13.3*  NEUTROABS  --  11.8*  HGB 12.2 12.2  HCT 36.0 36.2  MCV  --  94.5  PLT  --  563    Basic Metabolic Panel: Recent Labs  Lab 03/28/22 1058 03/28/22 1123  NA 133* 133*  K 3.9 3.9  CL  --  97*  CO2  --  20*  GLUCOSE  --  319*  BUN  --  17  CREATININE  --  1.31*  CALCIUM  --  9.0   GFR: Estimated Creatinine Clearance: 37.9 mL/min (A) (by C-G formula based on SCr of 1.31 mg/dL (H)). Recent Labs  Lab 03/28/22 1041 03/28/22 1123 03/28/22 1241  WBC  --  13.3*  --   LATICACIDVEN 4.7*  --  3.7*    Liver Function Tests: Recent Labs  Lab 03/28/22 1123  AST 58*  ALT 28  ALKPHOS 55  BILITOT 0.7  PROT 7.5  ALBUMIN 3.4*   No results for input(s): "LIPASE", "AMYLASE" in the last 168 hours. No results for input(s): "AMMONIA" in the last 168 hours.  ABG    Component  Value Date/Time   PHART 7.430 01/10/2019 1444   PCO2ART 34.3 01/10/2019 1444   PO2ART 80.0 (L) 01/10/2019 1444   HCO3 23.5 03/28/2022 1058   TCO2 24 03/28/2022 1058   ACIDBASEDEF 1.3 01/10/2019 1444   O2SAT 39 03/28/2022 1058     Coagulation Profile: Recent Labs  Lab 03/28/22 1123  INR 1.5*    Cardiac Enzymes: No results for input(s): "CKTOTAL", "CKMB", "CKMBINDEX", "TROPONINI" in the last 168 hours.  HbA1C: Hgb A1c MFr Bld  Date/Time Value Ref Range Status  12/26/2021 12:18 PM 10.0 (H) 4.6 - 6.5 % Final    Comment:    Glycemic Control Guidelines for People with Diabetes:Non Diabetic:  <6%Goal of Therapy: <7%Additional Action Suggested:  >8%   09/22/2021 11:51 AM 9.1 (H) 4.6 - 6.5 % Final    Comment:    Glycemic Control Guidelines for People with Diabetes:Non Diabetic:  <6%Goal of Therapy: <7%Additional Action Suggested:  >8%     CBG: No results for input(s): "GLUCAP" in  the last 168 hours.  Review of Systems:   All negative; except for those that are bolded, which indicate positives.  Constitutional: weight loss, weight gain, night sweats, fevers, chills, fatigue, weakness.  HEENT: headaches, sore throat, sneezing, nasal congestion, post nasal drip, difficulty swallowing, tooth/dental problems, visual complaints, visual changes, ear aches. Neuro: difficulty with speech, weakness, numbness, ataxia. CV:  chest pain, orthopnea, PND, swelling in lower extremities, dizziness, palpitations, syncope.  Resp: cough, hemoptysis, dyspnea, wheezing. GI: heartburn, indigestion, abdominal pain, nausea, vomiting, diarrhea, constipation, change in bowel habits, decreased appetite, hematemesis, melena, hematochezia.  GU: dysuria, change in color of urine, urgency or frequency, flank pain, hematuria. MSK: joint pain or swelling, decreased range of motion. Psych: change in mood or affect, depression, anxiety, suicidal ideations, homicidal ideations. Skin: rash, itching, bruising.   Past Medical History:  She,  has a past medical history of Anxiety, Atrial fibrillation and flutter ([place]), AV block, 2nd degree (10/2012), Chronic diastolic heart failure ([place]), Depression, GERD (gastroesophageal reflux disease), HH (hiatus hernia), History of pulmonary embolism (10/2008), Hyperlipidemia, Hypertension, Hypothyroidism, Obesity, Osteoarthritis, Peripheral neuropathy, Presence of permanent cardiac pacemaker, Pulmonary nodule, Renal insufficiency (2010), S/P TAVR (transcatheter aortic valve replacement), Shingles (09/17/2014), and Type II or unspecified type diabetes mellitus without mention of complication, not stated as uncontrolled (2009).   Surgical History:   Past Surgical History:  Procedure Laterality Date   APPENDECTOMY     AV NODE ABLATION N/A 07/05/2017   Procedure: AV NODE ABLATION;  Surgeon: [name], MD;  Location: [place] [facility];  Service: Cardiovascular;   Laterality: N/A;   CARPAL TUNNEL RELEASE     CATARACT EXTRACTION     CHOLECYSTECTOMY     COLONOSCOPY  2010   COLONOSCOPY WITH PROPOFOL N/A 07/25/2021   Procedure: COLONOSCOPY WITH PROPOFOL;  Surgeon: Mauri Pole, MD;  Location: [facility];  Service: Gastroenterology;  Laterality: N/A;   FOREARM FRACTURE SURGERY  09/17/2008   HERNIA REPAIR     INGUINAL HERNIA REPAIR     PACEMAKER INSERTION  10/10/2012   MDT [id number] implanted for 2nd degree AV block by [name]   PERMANENT PACEMAKER INSERTION N/A 10/10/2012   Procedure: PERMANENT PACEMAKER INSERTION;  Surgeon: [name], MD;  Location: [facility];  Service: Cardiovascular;  Laterality: N/A;   RIGHT/LEFT HEART CATH AND CORONARY ANGIOGRAPHY N/A 12/30/2018   Procedure: RIGHT/LEFT HEART CATH AND CORONARY ANGIOGRAPHY;  Surgeon: [name], MD;  Location: [place] [facility];  Service: Cardiovascular;  Laterality:  N/A;   ROTATOR CUFF REPAIR     TEE WITHOUT CARDIOVERSION N/A 01/14/2019   Procedure: TRANSESOPHAGEAL ECHOCARDIOGRAM (TEE);  Surgeon: [name], MD;  Location: [place] [facility];  Service: Open Heart Surgery;  Laterality: N/A;   TRANSCATHETER AORTIC VALVE REPLACEMENT, TRANSFEMORAL N/A 01/14/2019   Procedure: TRANSCATHETER AORTIC VALVE REPLACEMENT, TRANSFEMORAL;  Surgeon: [name], MD;  Location: [place] [facility];  Service: Open Heart Surgery;  Laterality: N/A;     Social History:   reports that she has never smoked. She has never used smokeless tobacco. She reports that she does not drink alcohol and does not use drugs.   Family History:  Her family history includes Aneurysm (age of onset: 15) in her daughter; Coronary artery disease in her mother; Heart attack (age of onset: 56) in her daughter; Heart attack (age of onset: 28) in her son; Heart disease in her maternal grandfather and maternal grandmother; Heart disease (age of onset: 77) in her mother; Multiple myeloma in her  brother; Peripheral vascular disease in her daughter; Stroke (age of onset: 70) in her brother and father.   Allergies Allergies  Allergen Reactions   Coreg [Carvedilol] Other (See Comments)    Weak legs   Relafen [Nabumetone] Other (See Comments)    Upset stomach   Amiodarone Other (See Comments)    Patient reported intolerance to Amiodarone with worsened tremor and stopped pta. (hand tremors)   Atorvastatin     Myalgias   Calcium Other (See Comments)    unknown   Codeine Other (See Comments)    unknown   Rofecoxib Other (See Comments)    Unknown (vioxx)   Simvastatin     Myalgias   Enalapril Maleate Other (See Comments)    REACTION: cough     Home Medications  Prior to Admission medications   Medication Sig Start Date End Date Taking? Authorizing Provider  allopurinol (ZYLOPRIM) 100 MG tablet TAKE 1 TABLET BY MOUTH DAILY 02/13/22   Plotnikov, Evie Lacks, MD  apixaban (ELIQUIS) 5 MG TABS tablet Take 1 tablet (5 mg total) by mouth 2 (two) times daily. 09/20/21   Vickie Epley, MD  Ascorbic Acid (VITAMIN C PO) Take 1 tablet by mouth daily.    [provider]  b complex vitamins tablet Take 1 tablet by mouth daily.    [provider]  Blood Glucose Calibration (OT ULTRA/FASTTK CNTRL SOLN) SOLN Use as directed 07/17/19   Plotnikov, Evie Lacks, MD  Blood Glucose Monitoring Suppl (ONE TOUCH ULTRA 2) w/Device KIT Use as instructed to check blood sugar BID 07/17/19   Plotnikov, Evie Lacks, MD  Cholecalciferol 1000 UNITS tablet Take 1,000 Units by mouth daily.    [provider]  ezetimibe (ZETIA) 10 MG tablet TAKE 1 TABLET BY MOUTH DAILY 02/13/22   Vickie Epley, MD  furosemide (LASIX) 40 MG tablet TAKE 1 TABLET BY MOUTH TWICE  DAILY 02/13/22   Plotnikov, Evie Lacks, MD  glucose blood (ONETOUCH ULTRA) test strip Use as instructed to check blood sugar BID 07/17/19   Plotnikov, Evie Lacks, MD  Lancets [facility] ULTRASOFT) lancets Use as instructed to check  blood sugar BID 07/17/19   Plotnikov, Evie Lacks, MD  levothyroxine (SYNTHROID) 75 MCG tablet TAKE 1 TABLET BY MOUTH DAILY 02/13/22   Plotnikov, Evie Lacks, MD  LORazepam (ATIVAN) 0.5 MG tablet TAKE 1 TABLET BY MOUTH TWICE  DAILY AS NEEDED FOR ANXIETY 03/09/22   Plotnikov, Evie Lacks, MD  metFORMIN (GLUCOPHAGE) 500 MG  tablet TAKE 1 TABLET BY MOUTH TWICE  DAILY WITH MEALS 02/13/22   Plotnikov, Evie Lacks, MD  metoprolol succinate (TOPROL-XL) 50 MG 24 hr tablet TAKE 1 TABLET BY MOUTH  DAILY WITH OR IMMEDIATELY  FOLLOWING A MEAL 11/07/21   Plotnikov, Evie Lacks, MD  Multiple Vitamin (MULTIVITAMIN WITH MINERALS) TABS tablet Take 1 tablet by mouth daily. Women's Research scientist (life sciences), Historical, MD  Multiple Vitamins-Minerals (PRESERVISION AREDS 2 PO) Take 1 tablet by mouth 2 (two) times daily.    [provider]  Polyethyl Glycol-Propyl Glycol (LUBRICANT EYE DROPS) 0.4-0.3 % SOLN Place 1 drop into both eyes 4 (four) times daily as needed (dry/irritated eyes.).    [provider]  potassium chloride SA (KLOR-CON M) 20 MEQ tablet TAKE 1 TABLET BY MOUTH DAILY 02/13/22   Plotnikov, Evie Lacks, MD  pramoxine-hydrocortisone (ANALPRAM HC) cream Apply topically in the morning and at bedtime. Use prn qhs 12/26/21   Plotnikov, Evie Lacks, MD  repaglinide (PRANDIN) 2 MG tablet TAKE 2 TABLETS BY MOUTH 3  TIMES DAILY BEFORE MEALS 11/07/21   Plotnikov, Evie Lacks, MD  rosuvastatin (CRESTOR) 10 MG tablet TAKE 1 TABLET BY MOUTH UP TO 3  TIMES WEEKLY AS TOLERATED 11/14/21   Skeet Latch, MD  sacubitril-valsartan (ENTRESTO) 97-103 MG Take 1 tablet by mouth 2 (two) times daily. 03/07/22   Skeet Latch, MD  temazepam (RESTORIL) 15 MG capsule TAKE 1 CAPSULE BY MOUTH EVERY NIGHT AT BEDTIME AS NEEDED 03/21/22   Plotnikov, Evie Lacks, MD  venlafaxine XR (EFFEXOR-XR) 75 MG 24 hr capsule TAKE 1 CAPSULE BY MOUTH  DAILY WITH BREAKFAST 05/02/21   Plotnikov, Evie Lacks, MD     Critical care time: N/A.   Montey Hora, [facility] For pager details, please see AMION or use Epic chat  After 1900, please call [facility] for cross coverage needs 03/28/2022, 5:06 PM

## 2022-03-28 NOTE — ED Notes (Signed)
Dr.Branham shown results of VBG, ED-Lab

## 2022-03-28 NOTE — Progress Notes (Signed)
Pharmacy Antibiotic Note  Jessica [name] is a 88 y.o. female admitted on 03/28/2022 with sepsis.  Pharmacy has been consulted for vancomycin dosing.  Presenting with AMS. WBC 13.3, Tmax 104. LA 4.7. Scr 1.31 (CrCl 37 mL/min).   Plan: Vancomycin 2g IV once then 1500 mg IV every 48 hours (est AUC 502, Vd 0.5) Monitor renal fx, cx results, clinical pic, and vanc levels    Height: '5\' 5"'$  (165.1 cm) Weight: 108.9 kg (240 lb) IBW/kg (Calculated) : 57  No data recorded.  No results for input(s): "WBC", "CREATININE", "LATICACIDVEN", "VANCOTROUGH", "VANCOPEAK", "VANCORANDOM", "GENTTROUGH", "GENTPEAK", "GENTRANDOM", "TOBRATROUGH", "TOBRAPEAK", "TOBRARND", "AMIKACINPEAK", "AMIKACINTROU", "AMIKACIN" in the last 168 hours.  CrCl cannot be calculated (Patient's most recent lab result is older than the maximum 21 days allowed.).    Allergies  Allergen Reactions   Coreg [Carvedilol] Other (See Comments)    Weak legs   Relafen [Nabumetone] Other (See Comments)    Upset stomach   Amiodarone Other (See Comments)    Patient reported intolerance to Amiodarone with worsened tremor and stopped pta. (hand tremors)   Atorvastatin     Myalgias   Calcium Other (See Comments)    unknown   Codeine Other (See Comments)    unknown   Rofecoxib Other (See Comments)    Unknown (vioxx)   Simvastatin     Myalgias   Enalapril Maleate Other (See Comments)    REACTION: cough    Antimicrobials this admission: Cefepime 12/26 >>  Vancomycin 12/26 >>   Dose adjustments this admission: N/A  Microbiology results: 12/26 BCx: sent 12/26 UCx: ordered  12/26 COVID/Flu/RSV PCR: neg  Thank you for allowing pharmacy to be a part of this patient's care.  Antonietta Jewel, PharmD, BCCCP Clinical Pharmacist  Phone: [phone] 03/28/2022 1:10 PM  Please check AMION for all [place] phone numbers After 10:00 PM, call [place] [phone]

## 2022-03-28 NOTE — Progress Notes (Signed)
Pharmacy Antibiotic Note  Jessica [name] is a 88 y.o. female admitted on 03/28/2022 with sepsis.  Pharmacy has been consulted for vancomycin dosing.  Presenting with AMS. WBC 13.3, Tmax 104. LA 4.7>3.7. Scr 1.31 (CrCl 37 mL/min).   Plan: Start cefepime 2g every 12 hours Continue vancomycin 1500 mg IV every 48 hours (est AUC 502, Vd 0.5) Monitor renal fx, cx results, and vanc levels as indicated    Height: '5\' 5"'$  (165.1 cm) Weight: 108.9 kg (240 lb) IBW/kg (Calculated) : 57  Temp (24hrs), Avg:100.9 F (38.3 C), Min:97.7 F (36.5 C), Max:104 F (40 C)  Recent Labs  Lab 03/28/22 1041 03/28/22 1123 03/28/22 1241  WBC  --  13.3*  --   CREATININE  --  1.31*  --   LATICACIDVEN 4.7*  --  3.7*    Estimated Creatinine Clearance: 37.9 mL/min (A) (by C-G formula based on SCr of 1.31 mg/dL (H)).    Allergies  Allergen Reactions   Coreg [Carvedilol] Other (See Comments)    Weak legs   Relafen [Nabumetone] Other (See Comments)    Upset stomach   Amiodarone Other (See Comments)    Patient reported intolerance to Amiodarone with worsened tremor and stopped pta. (hand tremors)   Atorvastatin     Myalgias   Calcium Other (See Comments)    unknown   Codeine Other (See Comments)    unknown   Rofecoxib Other (See Comments)    Unknown (vioxx)   Simvastatin     Myalgias   Enalapril Maleate Other (See Comments)    REACTION: cough    Antimicrobials this admission: Cefepime 12/26 >>  Vancomycin 12/26 >>  Flagyl 12/26 >>  Dose adjustments this admission: N/A  Microbiology results: 12/26 BCx: sent 12/26 UCx: ordered  12/26 COVID/Flu/RSV PCR: neg  Thank you for allowing pharmacy to participate in this patient's care.  Levonne Spiller, PharmD PGY1 Acute Care Resident  03/28/2022,6:52 PM

## 2022-03-28 NOTE — ED Notes (Addendum)
Pts step son brought in 2x boxes of the patients home meds. Family member was told to not to give patient any medications from the box. Pharm tech notified of medication to reconcile.

## 2022-03-28 NOTE — ED Provider Notes (Addendum)
Has complex presentation for sepsis and case has been reviewed with [facility] hospitalist Dr. Lorin Mercy.  Plan is also to place consult to intensivist.  Physical Exam  BP (!) 88/55   Pulse (!) 105   Temp 97.7 F (36.5 C) (Oral)   Resp (!) 21   Ht '5\' 5"'$  (1.651 m)   Wt 108.9 kg   SpO2 92%   BMI 39.94 kg/m   Physical Exam Constitutional:      Comments: Patient is awake and alert.  She seems mildly confused and somewhat tangential in answering questions but is situationally appropriate.  Mild increased work of breathing at rest.  Pale and physically deconditioned in appearance.  HENT:     Mouth/Throat:     Mouth: Mucous membranes are dry.     Pharynx: Oropharynx is clear.  Eyes:     Extraocular Movements: Extraocular movements intact.  Cardiovascular:     Rate and Rhythm: Tachycardia present.  Pulmonary:     Comments: Mild increased work of breathing.  Breath sounds soft at the bases. Abdominal:     Comments: Abdomen protuberant but nontender.  Musculoskeletal:     Comments: No peripheral edema of the feet or the ankles.  No active wounds on the lower legs.  Skin:    General: Skin is warm and dry.     Coloration: Skin is pale.  Neurological:     Comments: Mildly confused.  No focal neurologic deficits.     Procedures  Procedures  ED Course / MDM   Clinical Course as of 03/28/22 1702  Tue Mar 28, 2022  1426 Spoke with on-call radiologist concern for subacute to chronic subdural hematoma versus possible empyema considering patient is septic and altered.  Patient has a pacemaker limiting ability to get MRI.  She is being covered for sepsis with vancomycin and cefepime.  Will also add on Flagyl.  Both neurology and neurosurgery consulted. [VB]  9924 Spoke with Dr. Newman Pies who has reviewed patient's imaging of the head.  He suspects is more likely consistent with subacute/older subdural hematoma.  He says it is less likely to be an empyema as there is no evidence of surrounding  edema or ventricular involvement which typically you would see with empyema.  His NP/PA Jinny Blossom will be seeing patient and putting in formal consult but likely will recommend a repeat head CT since patient likely cannot get MRI with and without contrast due to her pacemaker/ICD.  He also recommends if she is able to get MRI to wait until she is more stable.  Continue antibiotics.  Hold Eliquis. [VB]  75 Spoke with Dr. Lorin Mercy hospitalist who is requesting at least ICU consult due to elevated lactate and concerns for empyema and severe sepsis.  Will put in consult. [VB]    Clinical Course User Index [VB] Elgie Congo, MD   Medical Decision Making Amount and/or Complexity of Data Reviewed Labs: ordered. Radiology: ordered. ECG/medicine tests: ordered.  Risk Prescription drug management. Decision regarding hospitalization.   I have reviewed the case with critical care.  Patient did become more hypotensive after her first fluid bolus with systolic blood pressures in the 80s.  Also mild increased work of breathing but maintaining airway without difficulty.  Critical care has done consultation and recommend more aggressive fluid resuscitation.  Patient still depleted in appearance with dry mucous membranes and hypotension.  PCM consulted and ordered 2 additional liters of fluid and albumin.  Will be to continue to monitor and reassess.  If patient shows stabilization will likely still admit to medical service.  If persistent hypotension or deterioration in respiratory status or general condition, PCM will admit.  18: 15 she was still in the mid 80s after second liter.  Will accelerate the third liter ordered by [facility] given persistent hypotension.  Reassessed patient's respiratory status.  She remains on 2 L without increased difficulty breathing at this time.  After completion of 2 additional liters fluids, patient's systolic blood pressures remain in the mid 80s.  Reconsulted PCM for admission.   Patient remains mildly confused.  I have assisted her in repositioning for comfort.  We moved her up onto her side with blankets and towels under her back.  She is experiencing a lot of discomfort in her sacral area from supine position.  CRITICAL CARE Performed by: Charlesetta Shanks   Total critical care time: 30 minutes  Critical care time was exclusive of separately billable procedures and treating other patients.  Critical care was necessary to treat or prevent imminent or life-threatening deterioration.  Critical care was time spent personally by me on the following activities: development of treatment plan with patient and/or surrogate as well as nursing, discussions with consultants, evaluation of patient's response to treatment, examination of patient, obtaining history from patient or surrogate, ordering and performing treatments and interventions, ordering and review of laboratory studies, ordering and review of radiographic studies, pulse oximetry and re-evaluation of patient's condition.        Charlesetta Shanks, MD 03/28/22 1707    Charlesetta Shanks, MD 03/28/22 [id number]    Charlesetta Shanks, MD 03/28/22 [phone]

## 2022-03-28 NOTE — Progress Notes (Signed)
An USGPIV (ultrasound guided PIV) has been placed for short-term vasopressor infusion. A correctly placed ivWatch must be used when administering Vasopressors. Should this treatment be needed beyond 72 hours, central line access should be obtained.  It will be the responsibility of the bedside nurse to follow best practice to prevent extravasations.   ?
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
## 2022-03-28 NOTE — ED Triage Notes (Signed)
Arrives via ems from home with AMS, UTI, N/V. CBG 406, VSS, [place] to stand and pivot for EMS, Pt is on elequis.

## 2022-03-28 NOTE — Progress Notes (Signed)
Attending:    Subjective: 88 y/o female presented to the ER today with fatigue, confusion and poor po intake.  Noted to have an elevated WBC, elevated lactic acid in the ER.  She has been hypotensive, received 1.5 liters of normal saline.  Confused on our assessment, unable to provide history.  She had a small subdural fluid collection seen on CT head.  Her neighbor says that she fell a few days ago and EMS was called.  Unclear if she had a head injury.  NSGY feels that she doesn't have an empyema in her skull, perhaps subdural hematoma.   Objective: Vitals:   03/28/22 1445 03/28/22 1500 03/28/22 1554 03/28/22 1700  BP:   (!) 88/55 (!) 85/43  Pulse: (!) 112 (!) 102 (!) 105 (!) 117  Resp: (!) 32 (!) 28 (!) 21 19  Temp:   97.7 F (36.5 C)   TempSrc:   Oral   SpO2: 90% 91% 92% 94%  Weight:      Height:          Intake/Output Summary (Last 24 hours) at 03/28/2022 1743 Last data filed at 03/28/2022 1655 Gross per 24 hour  Intake 2700 ml  Output --  Net 2700 ml    General:  Resting comfortably in bed HENT: NCAT OP clear mucus membranes dry PULM: CTA B, normal effort CV: RRR, no mgr GI: BS+, soft, nontender MSK: normal bulk and tone Neuro: awake, alert, no distress, MAEW   CBC    Component Value Date/Time   WBC 13.3 (H) 03/28/2022 1123   RBC 3.83 (L) 03/28/2022 1123   HGB 12.2 03/28/2022 1123   HGB 15.6 12/23/2018 1038   HCT 36.2 03/28/2022 1123   HCT 46.3 12/23/2018 1038   PLT 163 03/28/2022 1123   PLT 313 12/23/2018 1038   MCV 94.5 03/28/2022 1123   MCV 91 12/23/2018 1038   MCH 31.9 03/28/2022 1123   MCHC 33.7 03/28/2022 1123   RDW 13.4 03/28/2022 1123   RDW 12.4 12/23/2018 1038   LYMPHSABS 0.2 (L) 03/28/2022 1123   LYMPHSABS 2.4 12/23/2018 1038   MONOABS 1.0 03/28/2022 1123   EOSABS 0.0 03/28/2022 1123   EOSABS 0.1 12/23/2018 1038   BASOSABS 0.0 03/28/2022 1123   BASOSABS 0.0 12/23/2018 1038    BMET    Component Value Date/Time   NA 133 (L) 03/28/2022  1123   NA 138 12/16/2019 1103   K 3.9 03/28/2022 1123   CL 97 (L) 03/28/2022 1123   CO2 20 (L) 03/28/2022 1123   GLUCOSE 319 (H) 03/28/2022 1123   BUN 17 03/28/2022 1123   BUN 13 12/16/2019 1103   CREATININE 1.31 (H) 03/28/2022 1123   CREATININE 1.00 (H) 12/10/2019 1144   CALCIUM 9.0 03/28/2022 1123   GFRNONAA 40 (L) 03/28/2022 1123   GFRNONAA 52 (L) 12/10/2019 1144   GFRAA 63 12/16/2019 1103   GFRAA 60 12/10/2019 1144    CXR images bilateral airspace opacities,   Impression/Plan: Hypovolemic shock> very dry on physical exam, bolus fluids now, watch respiratory status carefully Sepsis> due to pneumonia? Unclear cause, doesn't appear to have a UTI, continue vanc, cefepime, flagyl, check blood cultures Elevated BNP, troponin, history of TAVR> appears dry on exam, would check echo Currently not in respiratoy distress, lungs clear on exam, give fluids and monitor respiratory status cautiously  PCCM will continue to monitor response to IV fluids Does not need ICU admission  Called family, phone call with daughter was ended abruptly for unknown reason.  When I asked her daughter if the patient had been sick lately she said "I don't know" and hung up the phone.  Neighbor says that she was in her usual state of health on 12/24 but could not provide further history.   My cc time n/a  Roselie Awkward, MD [facility] Pager: [phone] Cell: [phone] After 7pm: [phone]

## 2022-03-29 ENCOUNTER — Inpatient Hospital Stay (HOSPITAL_COMMUNITY): Payer: Medicare Other

## 2022-03-29 DIAGNOSIS — A419 Sepsis, unspecified organism: Secondary | ICD-10-CM | POA: Diagnosis not present

## 2022-03-29 DIAGNOSIS — I5032 Chronic diastolic (congestive) heart failure: Secondary | ICD-10-CM

## 2022-03-29 DIAGNOSIS — R6520 Severe sepsis without septic shock: Secondary | ICD-10-CM | POA: Diagnosis not present

## 2022-03-29 LAB — PHOSPHORUS: Phosphorus: 2.2 mg/dL — ABNORMAL LOW (ref 2.5–4.6)

## 2022-03-29 LAB — BLOOD CULTURE ID PANEL (REFLEXED) - BCID2

## 2022-03-29 LAB — ECHOCARDIOGRAM COMPLETE
AR max vel: 2.56 cm2
AV Area VTI: 2.68 cm2
AV Area mean vel: 2.46 cm2
AV Mean grad: 15 mmHg
AV Peak grad: 27.2 mmHg
Ao pk vel: 2.61 m/s
Area-P 1/2: 3.91 cm2
Calc EF: 55.4 %
Height: 65 in
S' Lateral: 3 cm
Single Plane A2C EF: 54.1 %
Single Plane A4C EF: 55 %
Weight: 3840 oz

## 2022-03-29 LAB — COMPREHENSIVE METABOLIC PANEL
ALT: 48 U/L — ABNORMAL HIGH (ref 0–44)
AST: 115 U/L — ABNORMAL HIGH (ref 15–41)
Albumin: 2.7 g/dL — ABNORMAL LOW (ref 3.5–5.0)
Alkaline Phosphatase: 44 U/L (ref 38–126)
Anion gap: 11 (ref 5–15)
BUN: 18 mg/dL (ref 8–23)
CO2: 18 mmol/L — ABNORMAL LOW (ref 22–32)
Calcium: 7.4 mg/dL — ABNORMAL LOW (ref 8.9–10.3)
Chloride: 98 mmol/L (ref 98–111)
Creatinine, Ser: 1.1 mg/dL — ABNORMAL HIGH (ref 0.44–1.00)
GFR, Estimated: 49 mL/min — ABNORMAL LOW (ref >60)
Glucose, Bld: 473 mg/dL — ABNORMAL HIGH (ref 70–99)
Potassium: 3.4 mmol/L — ABNORMAL LOW (ref 3.5–5.1)
Sodium: 127 mmol/L — ABNORMAL LOW (ref 135–145)
Total Bilirubin: 0.6 mg/dL (ref 0.3–1.2)
Total Protein: 5.9 g/dL — ABNORMAL LOW (ref 6.5–8.1)

## 2022-03-29 LAB — URINE CULTURE: Culture: NO GROWTH

## 2022-03-29 LAB — BRAIN NATRIURETIC PEPTIDE: B Natriuretic Peptide: 529.6 pg/mL — ABNORMAL HIGH (ref 0.0–100.0)

## 2022-03-29 LAB — CBC
HCT: 31 % — ABNORMAL LOW (ref 36.0–46.0)
Hemoglobin: 10.2 g/dL — ABNORMAL LOW (ref 12.0–15.0)
MCH: 31.2 pg (ref 26.0–34.0)
MCHC: 32.9 g/dL (ref 30.0–36.0)
MCV: 94.8 fL (ref 80.0–100.0)
Platelets: 100 10*3/uL — ABNORMAL LOW (ref 150–400)
RBC: 3.27 MIL/uL — ABNORMAL LOW (ref 3.87–5.11)
RDW: 13.7 % (ref 11.5–15.5)
WBC: 16.2 10*3/uL — ABNORMAL HIGH (ref 4.0–10.5)
nRBC: 0 % (ref 0.0–0.2)

## 2022-03-29 LAB — GLUCOSE, CAPILLARY
Glucose-Capillary: 222 mg/dL — ABNORMAL HIGH (ref 70–99)
Glucose-Capillary: 223 mg/dL — ABNORMAL HIGH (ref 70–99)
Glucose-Capillary: 179 mg/dL — ABNORMAL HIGH (ref 70–99)

## 2022-03-29 LAB — MAGNESIUM: Magnesium: 2 mg/dL (ref 1.7–2.4)

## 2022-03-29 LAB — CORTISOL-AM, BLOOD: Cortisol - AM: 27.4 ug/dL — ABNORMAL HIGH (ref 6.7–22.6)

## 2022-03-29 LAB — CBG MONITORING, ED
Glucose-Capillary: 251 mg/dL — ABNORMAL HIGH (ref 70–99)
Glucose-Capillary: 402 mg/dL — ABNORMAL HIGH (ref 70–99)
Glucose-Capillary: 301 mg/dL — ABNORMAL HIGH (ref 70–99)
Glucose-Capillary: 277 mg/dL — ABNORMAL HIGH (ref 70–99)

## 2022-03-29 LAB — PROTIME-INR
INR: 1.7 — ABNORMAL HIGH (ref 0.8–1.2)
Prothrombin Time: 20 seconds — ABNORMAL HIGH (ref 11.4–15.2)

## 2022-03-29 LAB — PROCALCITONIN: Procalcitonin: 3.38 ng/mL

## 2022-03-29 LAB — MRSA NEXT GEN BY PCR, NASAL: MRSA by PCR Next Gen: NOT DETECTED

## 2022-03-29 MED ORDER — PERFLUTREN LIPID MICROSPHERE
1.0000 mL | INTRAVENOUS | Status: AC | PRN
  Administered 2022-03-29: 3.5 mL via INTRAVENOUS

## 2022-03-29 MED ORDER — SODIUM BICARBONATE 8.4 % IV SOLN
100.0000 meq | Freq: Once | INTRAVENOUS | Status: AC
  Administered 2022-03-29: 100 meq via INTRAVENOUS
  Filled 2022-03-29: qty 50

## 2022-03-29 MED ORDER — POTASSIUM CHLORIDE 10 MEQ/100ML IV SOLN
10.0000 meq | INTRAVENOUS | Status: AC
  Administered 2022-03-29 (×3): 10 meq via INTRAVENOUS
  Filled 2022-03-29 (×3): qty 100

## 2022-03-29 MED ORDER — ORAL CARE MOUTH RINSE: 15.0000 mL | OROMUCOSAL | Status: DC | PRN

## 2022-03-29 MED ORDER — LACTATED RINGERS IV BOLUS
500.0000 mL | Freq: Once | INTRAVENOUS | Status: AC
  Administered 2022-03-29: 500 mL via INTRAVENOUS

## 2022-03-29 MED ORDER — ORAL CARE MOUTH RINSE
15.0000 mL | OROMUCOSAL | Status: DC
  Administered 2022-03-29 – 2022-04-05 (×15): 15 mL via OROMUCOSAL

## 2022-03-29 MED ORDER — POTASSIUM PHOSPHATES 15 MMOLE/5ML IV SOLN
30.0000 mmol | Freq: Once | INTRAVENOUS | Status: AC
  Administered 2022-03-29: 30 mmol via INTRAVENOUS
  Filled 2022-03-29: qty 10

## 2022-03-29 MED ORDER — LACTATED RINGERS IV SOLN: INTRAVENOUS | Status: DC

## 2022-03-29 MED ORDER — ALBUMIN HUMAN 25 % IV SOLN
12.5000 g | Freq: Once | INTRAVENOUS | Status: AC
  Administered 2022-03-29: 12.5 g via INTRAVENOUS
  Filled 2022-03-29: qty 50

## 2022-03-29 MED ORDER — CEFAZOLIN SODIUM-DEXTROSE 2-4 GM/100ML-% IV SOLN
2.0000 g | Freq: Three times a day (TID) | INTRAVENOUS | Status: DC
  Administered 2022-03-29 – 2022-04-09 (×34): 2 g via INTRAVENOUS
  Filled 2022-03-29 (×35): qty 100

## 2022-03-29 MED ORDER — CHLORHEXIDINE GLUCONATE CLOTH 2 % EX PADS
6.0000 | MEDICATED_PAD | Freq: Every day | CUTANEOUS | Status: DC
  Administered 2022-03-29 – 2022-04-03 (×6): 6 via TOPICAL

## 2022-03-29 NOTE — ED Notes (Signed)
Gm Can call Dema Severin [phone] about mother Alianys Chacko

## 2022-03-29 NOTE — Consult Note (Incomplete)
[facility]                                                                                        Patient Identification: Patient Name: Jessica [name] MRN: [id number] [place] Date: 03/28/2022 10:22 AM Today's Date: 03/29/2022 Reason for consult: staph bacteremia Requesting provider: CHAMP autoconsult   Principal Problem:   Severe sepsis with acute organ dysfunction [facility]) Active Problems:   Hypothyroidism   DM type 2, controlled, with complication ([place])   HLD (hyperlipidemia)   OBESITY, MORBID   Anxiety disorder   Essential hypertension   Chronic diastolic heart failure (HCC)   Atrial fibrillation and flutter ([place])   Presence of permanent cardiac pacemaker   Altered mental status   AKI (acute kidney injury) ([place])   DNR (do not resuscitate)   Hypotension   Antibiotics:  Vancomycin, cefepime/metronidazole 12/26  Lines/Hardware: ppm, tavr   Assessment # MSSA bacteremia/Septic shock - on vasopressors  # Left subdural hematoma subacute to chronic vs sub dural empyema - evaluated by neurosurgery and empyema felt less likely, although a possibility with staph bacteremia  - MRI not done due to PPM - Plan for repeat CT Head   # Encephalopathy - seems to have resolved  # Multiple electrolyte abnormalities/AKI  Recommendations  Abx switched to IV cefazolin, do not think IV nafcillin is needed given non-intraparenchymal infection for CNS penetration as well as AKI Fu repeat blood cx Will need TEE  FU repeat CT head  Monitor CBC and CMP Monitor for metastatic sites of infection   Dr Juleen China covering starting 12/28  Rest of the management as per the primary team. Please call with questions or concerns.  Thank you for the consult  Rosiland Oz, MD Infectious Disease Physician [facility] for Infectious Disease [address] [place], [place]  [address] Phone: [phone]  Fax: [phone]  __________________________________________________________________________________________________________ HPI and Hospital Course: 88 year old female with PMH as below including A-fib/flutter, second-degree AV block status post PPM, chronic diastolic CHF, type II DM, CKD, history of shingles, status post TAVR in 01/2019 who presented to the ED on 12/26 with altered mental status.  Patient was having nausea, vomiting decreased p.o. intake and foul-smelling urine and was found to be febrile upon EMS arrival including tachycardia, tachypnea, hypoxic and BG 406.  Was given IV fluid and Tylenol.  Per roommate she is normally AOx4 and ambulatory but has been in bed for the past 3 days.   ED presentation concerning for sepsis, altered mental status Labs remarkable for WBC 13.3 with left shift, lactate 4.7, bicarb 20 creatinine 1.31, A1c 59.4 Course complicated by hypotension requiring IV fluids and albumin 12/26 blood culture GPC in clusters, BC ID as MSSA  ROS: General- Denies fever, chills, loss of appetite and loss of weight HEENT - Denies headache, blurry vision, neck pain, sinus pain Chest - Denies any chest pain, SOB or cough CVS- Denies any dizziness/lightheadedness, syncopal attacks, palpitations Abdomen- abdominal pain, hematochezia and diarrhea, Nausea and vomiting + Neuro - Denies any weakness, numbness, tingling sensation Psych - Denies any changes in mood irritability or depressive  symptoms GU- Denies any burning, dysuria, hematuria or increased frequency of urination Skin - denies any rashes/lesions MSK - denies any joint pain/swelling or restricted ROM   Past Medical History:  Diagnosis Date   Anxiety    Atrial fibrillation and flutter ([place])    detected by PPM interrogation (mostly atrial flutter)   AV block, 2nd degree 10/2012   s/p MDT ADDRL1 pacemaker implantation 10/10/2012 by [name]   Chronic diastolic heart failure ([place])     Depression    GERD (gastroesophageal reflux disease)    HH (hiatus hernia)    History of pulmonary embolism 10/2008   Bilateral   Hyperlipidemia    Hypertension    Hypothyroidism    Obesity    Osteoarthritis    Peripheral neuropathy    Right leg   Presence of permanent cardiac pacemaker    Pulmonary nodule    in the lingula. Noted on pre TAVR CT. Will require follow up   Renal insufficiency 2010   S/P TAVR (transcatheter aortic valve replacement)    Shingles 09/17/2014   right lower quadrant   Type II or unspecified type diabetes mellitus without mention of complication, not stated as uncontrolled 2009   Past Surgical History:  Procedure Laterality Date   APPENDECTOMY     AV NODE ABLATION N/A 07/05/2017   Procedure: AV NODE ABLATION;  Surgeon: [name], MD;  Location: [facility];  Service: Cardiovascular;  Laterality: N/A;   CARPAL TUNNEL RELEASE     CATARACT EXTRACTION     CHOLECYSTECTOMY     COLONOSCOPY  2010   COLONOSCOPY WITH PROPOFOL N/A 07/25/2021   Procedure: COLONOSCOPY WITH PROPOFOL;  Surgeon: Mauri Pole, MD;  Location: [facility];  Service: Gastroenterology;  Laterality: N/A;   FOREARM FRACTURE SURGERY  09/17/2008   HERNIA REPAIR     INGUINAL HERNIA REPAIR     PACEMAKER INSERTION  10/10/2012   MDT ADDRL1 implanted for 2nd degree AV block by [name]   PERMANENT PACEMAKER INSERTION N/A 10/10/2012   Procedure: PERMANENT PACEMAKER INSERTION;  Surgeon: [name], MD;  Location: [facility] CATH [facility];  Service: Cardiovascular;  Laterality: N/A;   RIGHT/LEFT HEART CATH AND CORONARY ANGIOGRAPHY N/A 12/30/2018   Procedure: RIGHT/LEFT HEART CATH AND CORONARY ANGIOGRAPHY;  Surgeon: [name], MD;  Location: [place] [facility];  Service: Cardiovascular;  Laterality: N/A;   ROTATOR CUFF REPAIR     TEE WITHOUT CARDIOVERSION N/A 01/14/2019   Procedure: TRANSESOPHAGEAL ECHOCARDIOGRAM (TEE);  Surgeon: [name], MD;  Location: [place] [facility];  Service: Open Heart Surgery;  Laterality: N/A;   TRANSCATHETER AORTIC VALVE REPLACEMENT, TRANSFEMORAL N/A 01/14/2019   Procedure: TRANSCATHETER AORTIC VALVE REPLACEMENT, TRANSFEMORAL;  Surgeon: [name], MD;  Location: [place] [facility];  Service: Open Heart Surgery;  Laterality: N/A;    Scheduled Meds:  insulin aspart  0-20 Units Subcutaneous TID WC   insulin aspart  0-5 Units Subcutaneous QHS   levothyroxine  75 mcg Oral Q0600   sodium chloride flush  3 mL Intravenous Q12H   venlafaxine XR  75 mg Oral Q breakfast   Continuous Infusions:  sodium chloride Stopped (03/28/22 2304)   ceFEPime (MAXIPIME) IV Stopped (03/29/22 0029)   lactated ringers Stopped (03/28/22 2029)   metronidazole Stopped (03/29/22 0132)   norepinephrine (LEVOPHED) Adult infusion 10 mcg/min (03/29/22 0334)   [START ON 03/30/2022] vancomycin     PRN Meds:.acetaminophen **OR** acetaminophen, docusate sodium, ondansetron **OR** ondansetron (ZOFRAN) IV, polyethylene glycol  Allergies  Allergen Reactions   Coreg [Carvedilol] Other (See Comments)    Weak legs   Relafen [Nabumetone] Other (See Comments)    Upset stomach   Calcium Other (See Comments)    Unknown reaction   Codeine Other (See Comments)    Unknown reaction   Lipitor [Atorvastatin] Other (See Comments)    Myalgias   Pacerone [Amiodarone] Other (See Comments)    Hand tremors   Vasotec [Enalapril] Cough   Vioxx [Rofecoxib] Other (See Comments)    Unknown reaction   Zocor [Simvastatin] Other (See Comments)    Myalgias   Social History   Socioeconomic History   Marital status: Widowed    Spouse name: Not on file   Number of children: 3   Years of education: Not on file   Highest education level: Not on file  Occupational History   Not on file  Tobacco Use   Smoking status: Never   Smokeless tobacco: Never  Vaping Use   Vaping Use: Never used  Substance and Sexual Activity   Alcohol use: No     Alcohol/week: 0.0 standard drinks of alcohol   Drug use: No   Sexual activity: Not Currently  Other Topics Concern   Not on file  Social History Narrative   Opth - Dr Leonie Man   Retired, Looking after great-grand baby; lives w/son   Daily Caffeine Use - 1   Widow - suicide 2008/09/10; 2 daughters died      lost brother and son 06/03/2012; spouse died 2008-06-03   Have 4 children; 3 have expired,  now has one; dtr; copes by reading    Father had stroke at 66 and mother had heart disease; MI at 55  but lived to be 69.    Son died quickly of lung cancer   [place]; the oldest had aneurysm   Youngest dtr 57 and died of MI   Twin sister dx with Alzheimer's today/    Will give information regarding Alz resources   Social Determinants of Health   Financial Resource Strain: Low Risk  (07/01/2021)   Overall Financial Resource Strain (CARDIA)    Difficulty of Paying Living Expenses: Not hard at all  Food Insecurity: No Food Insecurity (07/01/2021)   Hunger Vital Sign    Worried About Running Out of Food in the Last Year: Never true    Ran Out of Food in the Last Year: Never true  Transportation Needs: No Transportation Needs (07/01/2021)   PRAPARE - Hydrologist (Medical): No    Lack of Transportation (Non-Medical): No  Physical Activity: Inactive (07/01/2021)   Exercise Vital Sign    Days of Exercise per Week: 0 days    Minutes of Exercise per Session: 0 min  Stress: No Stress Concern Present (07/01/2021)   [place]    Feeling of Stress : Not at all  Social Connections: Socially Isolated (07/01/2021)   Social Connection and Isolation Panel [NHANES]    Frequency of Communication with Friends and Family: Twice a week    Frequency of Social Gatherings with Friends and Family: Twice a week    Attends Religious Services: Never    Marine scientist or Organizations: No    Attends Archivist Meetings:  Never    Marital Status: Widowed  Intimate Partner Violence: Not At Risk (07/01/2021)   Humiliation, Afraid, Rape, and Kick questionnaire    Fear of Current or Ex-Partner:  No    Emotionally Abused: No    Physically Abused: No    Sexually Abused: No   Family History  Problem Relation Age of Onset   Stroke Brother 28   Coronary artery disease Mother    Heart disease Mother 69   Stroke Father 12   Multiple myeloma Brother    Heart attack Son 12       Died of MI   Heart attack Daughter 33       Died of MI   Aneurysm Daughter 55       Question cerebral   Heart disease Maternal Grandmother    Heart disease Maternal Grandfather    Peripheral vascular disease Daughter        Amputation secondary to DM     Vitals  BP (!) 128/46   Pulse 79   Temp 98.5 F (36.9 C) (Oral)   Resp 17   Ht _0  (1.651 m)   Wt 108.9 kg   SpO2 97%   BMI 39.94 kg/m    Physical Exam Constitutional: Morbidly obese female chronically ill appearing lying in the bed and having TTE done    Comments:   Cardiovascular:     Rate and Rhythm: Normal rate and regular rhythm.     Heart sounds: Mechanical heart sounds  Pulmonary:     Effort: Pulmonary effort is normal on nasal cannula    Comments: Normal breath sounds  Abdominal:     Palpations: Abdomen is soft.     Tenderness: Nontender and nondistended  Musculoskeletal:        General: No swelling or tenderness in peripheral joints.  No spinal tenderness  Skin:    Comments: No obvious rashes  Neurological:     General: Awake/alert and oriented.  Grossly nonfocal  Psychiatric:        Mood and Affect: Mood normal.    Pertinent Microbiology Results for orders placed or performed during the hospital encounter of 03/28/22  Blood Culture (routine x 2)     Status: None (Preliminary result)   Collection Time: 03/28/22 10:41 AM   Specimen: BLOOD  Result Value Ref Range Status   Specimen Description BLOOD SITE NOT SPECIFIED  Final   Special  Requests   Final    BOTTLES DRAWN AEROBIC AND ANAEROBIC Blood Culture adequate volume   Culture  Setup Time   Final    GRAM POSITIVE COCCI IN CLUSTERS IN BOTH AEROBIC AND ANAEROBIC BOTTLES Organism ID to follow CRITICAL RESULT CALLED TO, READ BACK BY AND VERIFIED WITH: T RUDISILL,PHARMD_1  03/29/22 [place] Performed at [facility], [address], [place], [place] [address]    Culture GRAM POSITIVE COCCI  Final   Report Status PENDING  Incomplete  Blood Culture ID Panel (Reflexed)     Status: Abnormal   Collection Time: 03/28/22 10:41 AM  Result Value Ref Range Status   Enterococcus faecalis NOT DETECTED NOT DETECTED Final   Enterococcus Faecium NOT DETECTED NOT DETECTED Final   Listeria monocytogenes NOT DETECTED NOT DETECTED Final   Staphylococcus species DETECTED (A) NOT DETECTED Final    Comment: CRITICAL RESULT CALLED TO, READ BACK BY AND VERIFIED WITH: T RUDISILL,PHARMD_2  03/29/22 [place]    Staphylococcus aureus (BCID) DETECTED (A) NOT DETECTED Final    Comment: CRITICAL RESULT CALLED TO, READ BACK BY AND VERIFIED WITH: T RUDISILL,PHARMD_3  03/29/22 [place]    Staphylococcus epidermidis NOT DETECTED NOT DETECTED Final   Staphylococcus lugdunensis NOT DETECTED NOT DETECTED Final   Streptococcus  species NOT DETECTED NOT DETECTED Final   Streptococcus agalactiae NOT DETECTED NOT DETECTED Final   Streptococcus pneumoniae NOT DETECTED NOT DETECTED Final   Streptococcus pyogenes NOT DETECTED NOT DETECTED Final   A.calcoaceticus-baumannii NOT DETECTED NOT DETECTED Final   Bacteroides fragilis NOT DETECTED NOT DETECTED Final   Enterobacterales NOT DETECTED NOT DETECTED Final   Enterobacter cloacae complex NOT DETECTED NOT DETECTED Final   Escherichia coli NOT DETECTED NOT DETECTED Final   Klebsiella aerogenes NOT DETECTED NOT DETECTED Final   Klebsiella oxytoca NOT DETECTED NOT DETECTED Final   Klebsiella pneumoniae NOT DETECTED NOT DETECTED Final   Proteus species NOT DETECTED  NOT DETECTED Final   Salmonella species NOT DETECTED NOT DETECTED Final   Serratia marcescens NOT DETECTED NOT DETECTED Final   Haemophilus influenzae NOT DETECTED NOT DETECTED Final   Neisseria meningitidis NOT DETECTED NOT DETECTED Final   Pseudomonas aeruginosa NOT DETECTED NOT DETECTED Final   Stenotrophomonas maltophilia NOT DETECTED NOT DETECTED Final   Candida albicans NOT DETECTED NOT DETECTED Final   Candida auris NOT DETECTED NOT DETECTED Final   Candida glabrata NOT DETECTED NOT DETECTED Final   Candida krusei NOT DETECTED NOT DETECTED Final   Candida parapsilosis NOT DETECTED NOT DETECTED Final   Candida tropicalis NOT DETECTED NOT DETECTED Final   Cryptococcus neoformans/gattii NOT DETECTED NOT DETECTED Final   Meth resistant mecA/C and MREJ NOT DETECTED NOT DETECTED Final    Comment: Performed at [facility], [address], [place], [place] [address]  Resp panel by RT-PCR (RSV, Flu A&B, Covid) Anterior Nasal Swab     Status: None   Collection Time: 03/28/22 10:42 AM   Specimen: Anterior Nasal Swab  Result Value Ref Range Status   SARS Coronavirus 2 by RT PCR NEGATIVE NEGATIVE Final    Comment: (NOTE) SARS-CoV-2 target nucleic acids are NOT DETECTED.  The SARS-CoV-2 RNA is generally detectable in upper respiratory specimens during the acute phase of infection. The lowest concentration of SARS-CoV-2 viral copies this assay can detect is 138 copies/mL. A negative result does not preclude SARS-Cov-2 infection and should not be used as the sole basis for treatment or other patient management decisions. A negative result may occur with  improper specimen collection/handling, submission of specimen other than nasopharyngeal swab, presence of viral mutation(s) within the areas targeted by this assay, and inadequate number of viral copies(<138 copies/mL). A negative result must be combined with clinical observations, patient history, and epidemiological information.  The expected result is Negative.  Fact Sheet for Patients:  EntrepreneurPulse.com.au  Fact Sheet for Healthcare Providers:  IncredibleEmployment.be  This test is no t yet approved or cleared by the [place] FDA and  has been authorized for detection and/or diagnosis of SARS-CoV-2 by FDA under an Emergency Use Authorization (EUA). This EUA will remain  in effect (meaning this test can be used) for the duration of the COVID-19 declaration under Section 564(b)(1) of the Act, 21 U.S.C.section 360bbb-3(b)(1), unless the authorization is terminated  or revoked sooner.       Influenza A by PCR NEGATIVE NEGATIVE Final   Influenza B by PCR NEGATIVE NEGATIVE Final    Comment: (NOTE) The Xpert Xpress SARS-CoV-2/FLU/RSV plus assay is intended as an aid in the diagnosis of influenza from Nasopharyngeal swab specimens and should not be used as a sole basis for treatment. Nasal washings and aspirates are unacceptable for Xpert Xpress SARS-CoV-2/FLU/RSV testing.  Fact Sheet for Patients: EntrepreneurPulse.com.au  Fact Sheet for Healthcare Providers: IncredibleEmployment.be  This test is not yet approved or cleared by the [place] and has been authorized for detection and/or diagnosis of SARS-CoV-2 by FDA under an Emergency Use Authorization (EUA). This EUA will remain in effect (meaning this test can be used) for the duration of the COVID-19 declaration under Section 564(b)(1) of the Act, 21 U.S.C. section 360bbb-3(b)(1), unless the authorization is terminated or revoked.     Resp Syncytial Virus by PCR NEGATIVE NEGATIVE Final    Comment: (NOTE) Fact Sheet for Patients: EntrepreneurPulse.com.au  Fact Sheet for Healthcare Providers: IncredibleEmployment.be  This test is not yet approved or cleared by the [place] FDA and has been authorized for detection and/or  diagnosis of SARS-CoV-2 by FDA under an Emergency Use Authorization (EUA). This EUA will remain in effect (meaning this test can be used) for the duration of the COVID-19 declaration under Section 564(b)(1) of the Act, 21 U.S.C. section 360bbb-3(b)(1), unless the authorization is terminated or revoked.  Performed at [facility], [place] [address], [place], [place] [address]    *Note: Due to a large number of results and/or encounters for the requested time period, some results have not been displayed. A complete set of results can be found in Results Review.     Pertinent Lab seen by me:    Latest Ref Rng & Units 03/29/2022    5:22 AM 03/28/2022   11:23 AM 03/28/2022   10:58 AM  CBC  WBC 4.0 - 10.5 K/uL 16.2  13.3    Hemoglobin 12.0 - 15.0 g/dL 10.2  12.2  12.2   Hematocrit 36.0 - 46.0 % 31.0  36.2  36.0   Platelets 150 - 400 K/uL 100  163        Latest Ref Rng & Units 03/29/2022    5:22 AM 03/28/2022   11:23 AM 03/28/2022   10:58 AM  CMP  Glucose 70 - 99 mg/dL 473  319    BUN 8 - 23 mg/dL 18  17    Creatinine 0.44 - 1.00 mg/dL 1.10  1.31    Sodium 135 - 145 mmol/L 127  133  133   Potassium 3.5 - 5.1 mmol/L 3.4  3.9  3.9   Chloride 98 - 111 mmol/L 98  97    CO2 22 - 32 mmol/L 18  20    Calcium 8.9 - 10.3 mg/dL 7.4  9.0    Total Protein 6.5 - 8.1 g/dL 5.9  7.5    Total Bilirubin 0.3 - 1.2 mg/dL 0.6  0.7    Alkaline Phos 38 - 126 U/L 44  55    AST 15 - 41 U/L 115  58    ALT 0 - 44 U/L 48  28       Pertinent Imagings/Other Imagings Plain films and CT images have been personally visualized and interpreted; radiology reports have been reviewed. Decision making incorporated into the Impression / Recommendations.  CT ANGIO HEAD NECK W WO CM  Result Date: 03/28/2022 CLINICAL DATA:  Provided history: Altered mental status, septic, right lower extremity weakness. EXAM: CT ANGIOGRAPHY HEAD AND NECK TECHNIQUE: Multidetector CT imaging of the head and neck was performed  using the standard protocol during bolus administration of intravenous contrast. Multiplanar CT image reconstructions and MIPs were obtained to evaluate the vascular anatomy. Carotid stenosis measurements (when applicable) are obtained utilizing NASCET criteria, using the distal internal carotid diameter as the denominator. RADIATION DOSE REDUCTION: This exam was performed according to the departmental dose-optimization program  which includes automated exposure control, adjustment of the mA and/or kV according to patient size and/or use of iterative reconstruction technique. CONTRAST:  54m OMNIPAQUE IOHEXOL 350 MG/ML SOLN COMPARISON:  Report from brain MRI 03/23/2002 (images unavailable). FINDINGS: CT HEAD FINDINGS Brain: Frontal predominant cerebral atrophy. Questionable intermediate to low-density subdural collection overlying the left cerebral hemisphere, measuring up to 6 mm in thickness (for instance as seen on series 8, image 34). No more than mild mass effect upon the underlying left cerebral hemisphere. No midline shift. Mild patchy and ill-defined hypoattenuation within the cerebral white matter, nonspecific but compatible with chronic small vessel ischemic disease. No demarcated cortical infarct. No evidence of an intracranial mass. Vascular: No hyperdense vessel. Atherosclerotic calcifications. Skull: No fracture or aggressive osseous lesion. Sinuses/Orbits: No mass or acute finding within the imaged orbits. Trace mucosal thickening scattered within the paranasal sinuses. Other: 10 mm lucent lesion within the anterior maxilla at midline. This may reflect an incisive canal cyst or a residual cyst. Review of the MIP images confirms the above findings CTA NECK FINDINGS Aortic arch: Standard aortic branching. Atherosclerotic plaque within the visualized aortic arch and proximal major branch vessels of the neck No hemodynamically significant innominate or proximal subclavian artery stenosis. Right carotid  system: CCA and ICA patent within the neck without stenosis. Mild atherosclerotic plaque within the mid to distal CCA and about the carotid bifurcation. Left carotid system: CCA and ICA patent within the neck without hemodynamically significant stenosis (50% or greater). Atherosclerotic plaque, greatest about the carotid bifurcation and within the proximal ICA. Vertebral arteries: Vertebral arteries codominant and patent within the neck without hemodynamically significant stenosis. Nonstenotic atherosclerotic plaque at the origin of the right vertebral artery. Skeleton: Cervical spondylosis. Bilateral facet joint ankylosis at C2-C3. No acute fracture or aggressive osseous lesion. Other neck: No neck mass or cervical lymphadenopathy. Upper chest: No consolidation within the imaged lung apices. Review of the MIP images confirms the above findings CTA HEAD FINDINGS Anterior circulation: The intracranial internal carotid arteries are patent. Atherosclerotic plaque within both vessels without stenosis. The M1 middle cerebral arteries are patent. No M2 proximal branch occlusion or high-grade proximal stenosis. The anterior cerebral arteries are patent. No intracranial aneurysm is identified. Posterior circulation: The intracranial vertebral arteries are patent. Sclerotic plaque within the left V4 segment without stenosis. The basilar artery is patent. The posterior cerebral arteries are patent. A right posterior communicating artery is present. The left posterior communicating artery is diminutive or absent. Venous sinuses: Within the limitations of contrast timing, no convincing thrombus. Anatomic variants: As described. Review of the MIP images confirms the above findings CT head impression #1 called by telephone at the time of interpretation on 03/28/2022 at 2:26 pm to provider VGeorgina Snell, who verbally acknowledged these results. IMPRESSION: CT head: 1. Questionable intermediate to low-density subdural  collection overlying the left cerebral hemisphere (measuring up to 6 mm). This may reflect artifact or a subacute-to-chronic subdural hematoma. However, given the provided history of sepsis, a subdural empyema cannot be excluded. Consider a brain MRI without and with contrast for further evaluation, if the patient is able to have one. No more than mild mass effect upon the underlying left cerebral hemisphere. No midline shift. 2. Mild chronic small-vessel ischemic changes within the cerebral white matter. 3. Frontal predominant cerebral atrophy. CTA neck: 1. The common carotid, internal carotid and vertebral arteries are patent within the neck without hemodynamically significant stenosis. Atherosclerotic plaque within these vessels, as described. 2.  Aortic Atherosclerosis (  ICD10-I70.0). CTA head: Intracranial atherosclerotic disease, as described. No intracranial large vessel occlusion or proximal high-grade arterial stenosis identified. Electronically Signed   By: Kellie Simmering D.O.   On: 03/28/2022 14:28   CT ABDOMEN PELVIS W CONTRAST  Result Date: 03/28/2022 CLINICAL DATA:  Altered mental status. Sepsis. Right lower extremity weakness. EXAM: CT ABDOMEN AND PELVIS WITH CONTRAST TECHNIQUE: Multidetector CT imaging of the abdomen and pelvis was performed using the standard protocol following bolus administration of intravenous contrast. RADIATION DOSE REDUCTION: This exam was performed according to the departmental dose-optimization program which includes automated exposure control, adjustment of the mA and/or kV according to patient size and/or use of iterative reconstruction technique. CONTRAST:  70m OMNIPAQUE IOHEXOL 350 MG/ML SOLN COMPARISON:  01/07/2019 FINDINGS: Lower chest: Mild patchy density at the lung bases that could be atelectasis or minimal basilar pneumonia. Hiatal hernia is present. 8 mm nodule previously seen in the lingula is unchanged since October of 26067and therefore certainly benign.  Hepatobiliary: Liver parenchyma is normal. Previous cholecystectomy. Pancreas: Normal Spleen: Normal Adrenals/Urinary Tract: Adrenal glands are normal. Right kidney is normal. Left kidney is normal except for a nonobstructing 3 mm stone in the lower pole. No hydroureteronephrosis. No stone in the bladder Stomach/Bowel: Hiatal hernia as noted above. No acute gastric finding. Small-bowel is normal. No acute colon finding. Diverticulosis of the left colon without visible diverticulitis. Vascular/Lymphatic: Aortic atherosclerosis. No aneurysm. IVC is normal. No adenopathy Reproductive: No pelvic mass of significance. Small uterine leiomyomas. Other: No free fluid or air. Musculoskeletal: Ordinary spinal degenerative changes. Osteoarthritis of both hips. IMPRESSION: 1. No acute abdominal or pelvic finding. 2. Mild patchy density at the lung bases that could be atelectasis or minimal basilar pneumonia. 3. Hiatal hernia. 4. Previous cholecystectomy. 5. 3 mm nonobstructing stone in the lower pole of the left kidney. 6. Diverticulosis of the left colon without visible diverticulitis. 7. Aortic atherosclerosis. Aortic Atherosclerosis (ICD10-I70.0). Electronically Signed   By: MNelson ChimesM.D.   On: 03/28/2022 14:00   DG Chest Port 1 View  Result Date: 03/28/2022 CLINICAL DATA:  Sepsis. EXAM: PORTABLE CHEST 1 VIEW COMPARISON:  January 14, 2019. FINDINGS: Stable cardiomediastinal silhouette. Left-sided pacemaker is unchanged in position. Mild central pulmonary vascular congestion is noted. Status post transcatheter aortic valve repair. Mild bibasilar subsegmental atelectasis is noted. Bony thorax is unremarkable. IMPRESSION: Stable cardiomediastinal silhouette with probable mild central pulmonary vascular congestion. Mild bibasilar subsegmental atelectasis. Electronically Signed   By: JMarijo ConceptionM.D.   On: 03/28/2022 11:11    I spent more than 80 minutes for this patient encounter including review of prior medical  records/discussing diagnostics and treatment plan with the patient/family/coordinate care with primary/other specialits with greater than 50% of time in face to face encounter.   Electronically signed by:   SRosiland Oz MD Infectious Disease Physician [facility] Pager: [phone]

## 2022-03-29 NOTE — Progress Notes (Signed)
Echocardiogram 2D Echocardiogram has been performed.  Fidel Levy 03/29/2022, 10:11 AM

## 2022-03-29 NOTE — ED Notes (Signed)
This RN updated family that patient was still in ED awaiting bed upstairs. Family requested patient to call and update her. Patient provided with phone and daughter called and updated by patient.

## 2022-03-29 NOTE — ED Notes (Signed)
E-Link physician notified that patient is unable to have MRI due to incompatible device

## 2022-03-29 NOTE — Progress Notes (Signed)
eLink Physician-Brief Progress Note Patient Name: Jessica [name] DOB: [birth year] MRN: [id number]   Date of Service  03/29/2022  HPI/Events of Note  Patient admitted with septic shock secondary to MSSA bacteremia, also altered mental status thought to be due to sepsis, and a subdural hematoma.  eICU Interventions  New Patient Evaluation.        Frederik Pear 03/29/2022, 8:36 PM

## 2022-03-29 NOTE — Progress Notes (Signed)
PHARMACY - PHYSICIAN COMMUNICATION CRITICAL VALUE ALERT - BLOOD CULTURE IDENTIFICATION (BCID)  Jessica [name] is an 88 y.o. female who presented to [facility] on 03/28/2022 with a chief complaint of AMS  Assessment:  MSSA unknown source  Name of physician (or Provider) Contacted: Ogan  Current antibiotics: Vancomycin, Cefepime, Flagyl  Changes to prescribed antibiotics recommended: De-escalate to Ancef Recommendations declined by provider due to Patient has only been at the hospital ~12 hours and early in the hospital course, deferring to day team  Results for orders placed or performed during the hospital encounter of 03/28/22  Blood Culture ID Panel (Reflexed) (Collected: 03/28/2022 10:41 AM)  Result Value Ref Range   Enterococcus faecalis NOT DETECTED NOT DETECTED   Enterococcus Faecium NOT DETECTED NOT DETECTED   Listeria monocytogenes NOT DETECTED NOT DETECTED   Staphylococcus species DETECTED (A) NOT DETECTED   Staphylococcus aureus (BCID) DETECTED (A) NOT DETECTED   Staphylococcus epidermidis NOT DETECTED NOT DETECTED   Staphylococcus lugdunensis NOT DETECTED NOT DETECTED   Streptococcus species NOT DETECTED NOT DETECTED   Streptococcus agalactiae NOT DETECTED NOT DETECTED   Streptococcus pneumoniae NOT DETECTED NOT DETECTED   Streptococcus pyogenes NOT DETECTED NOT DETECTED   A.calcoaceticus-baumannii NOT DETECTED NOT DETECTED   Bacteroides fragilis NOT DETECTED NOT DETECTED   Enterobacterales NOT DETECTED NOT DETECTED   Enterobacter cloacae complex NOT DETECTED NOT DETECTED   Escherichia coli NOT DETECTED NOT DETECTED   Klebsiella aerogenes NOT DETECTED NOT DETECTED   Klebsiella oxytoca NOT DETECTED NOT DETECTED   Klebsiella pneumoniae NOT DETECTED NOT DETECTED   Proteus species NOT DETECTED NOT DETECTED   Salmonella species NOT DETECTED NOT DETECTED   Serratia marcescens NOT DETECTED NOT DETECTED   Haemophilus influenzae NOT DETECTED NOT DETECTED   Neisseria  meningitidis NOT DETECTED NOT DETECTED   Pseudomonas aeruginosa NOT DETECTED NOT DETECTED   Stenotrophomonas maltophilia NOT DETECTED NOT DETECTED   Candida albicans NOT DETECTED NOT DETECTED   Candida auris NOT DETECTED NOT DETECTED   Candida glabrata NOT DETECTED NOT DETECTED   Candida krusei NOT DETECTED NOT DETECTED   Candida parapsilosis NOT DETECTED NOT DETECTED   Candida tropicalis NOT DETECTED NOT DETECTED   Cryptococcus neoformans/gattii NOT DETECTED NOT DETECTED   Meth resistant mecA/C and MREJ NOT DETECTED NOT DETECTED    [name] 03/29/2022  1:07 AM

## 2022-03-29 NOTE — ED Notes (Signed)
Initial blood sugar read high at 402. Repeat CBG obtained to verify, new reading 251.

## 2022-03-29 NOTE — Inpatient Diabetes Management (Signed)
Inpatient Diabetes Program Recommendations  AACE/ADA: New Consensus Statement on Inpatient Glycemic Control (2015)  Target Ranges:  Prepandial:   less than 140 mg/dL      Peak postprandial:   less than 180 mg/dL (1-2 hours)      Critically ill patients:  140 - 180 mg/dL    Latest Reference Range & Units 03/28/22 22:59 03/29/22 08:30 03/29/22 08:40  Glucose-Capillary 70 - 99 mg/dL 252 (H)  3 units Novolog  402 (H) 251 (H)  11 units Novolog   (H): Data is abnormally high   Admit with: Severe Sepsis  History: DM2, CKD  Home DM Meds: Prandin 4 mg TID       Metformin 500 mg BID  Current Orders: Novolog Resistant Correction Scale/ SSI (0-20 units) TID AC + HS    MD- Note CBG 251 this AM.    May consider starting weight based basal insulin: Semglee 10 units Daily to start (0.1 units/kg)    --Will follow patient during hospitalization--  Wyn Quaker RN, MSN, [place] Diabetes Coordinator Inpatient Glycemic Control Team Team Pager: [phone] (8a-5p)

## 2022-03-29 NOTE — Progress Notes (Signed)
NAME:  Jessica [name], MRN:  [id number], DOB:  [birth year], LOS: 1 ADMISSION DATE:  03/28/2022, CONSULTATION DATE:  03/28/22 REFERRING MD:  Lorin Mercy CHIEF COMPLAINT:  AMS   History of Present Illness:  Jessica [name] is a 88 y.o. female who has a PMH as below. She presented to [facility] ED 12/26 with AMS. She had not been feeling well for a few days prior to this and had decreased PO intake, N/V, and dysuria. Due to not feeling well, she has essentially been bed ridden for the past few days.  In ED, she was febrile to 101.7 and had soft blood pressures with systolics in the 87F. One charting does show a rectal temp of 104 and repeat oral temp 3 hours later was 97.7. She received 1.5L NS bolus and additional fluids were withheld due to concern for pulmonary edema and new O2 requirement. Her BP had minimal improvement; therefore, PCCM asked to see in consultation. She had CT of abd/pelv which was negative for any acute findings and showed possible atelectasis vs basilar PNA. She also had CT and CTA head/neck which demonstrated questionable low density subdural collection over the left cerebral hemisphere along with mild chronic small vessel ischemic changes. Neurosurgery was consulted and felt that her imaging represented a chronic SDH with subdural empyema being less likely given the fact that she did not have any marked edema or mass effect or any ventricular involvement. They recommended holding Eliquis and repeating head CT in 48 hours. She is not able to get an MRI due to her pacemaker.  At the time of our evaluation, her SBP ranges from high 70s to high 80s. She is awake and tells me "I feel ok but not great". She endorses decreased PO intake for about 2 days and nausea with vomiting. She has very dry lips and mucous membranes and it is difficult for her to articulate clearly. She is able to move all extremities against resistance and is equal bilaterally.  She had persistent hypotension after  additional fluids; therefore, was started on Levophed and upgraded to ICU.  Pertinent  Medical History:  has Hypothyroidism; DM type 2, controlled, with complication ([place]); HLD (hyperlipidemia); OBESITY, MORBID; Anxiety disorder; Grieving; CARPAL TUNNEL SYNDROME; Essential hypertension; DUODENITIS, WITHOUT HEMORRHAGE; HIATAL HERNIA; Diverticulosis of colon without hemorrhage; Osteoarthritis; Low back pain; GERD (gastroesophageal reflux disease); Foot pain, left; Gastrocnemius strain; Left carotid artery stenosis; Paronychia of finger, left; Erythema nodosum; Chronic venous insufficiency; DOE (dyspnea on exertion); Chronic diastolic heart failure ([place]); Atrial fibrillation and flutter ([place]); Severe aortic stenosis; History of pulmonary embolism; Presence of permanent cardiac pacemaker; Pulmonary nodule; S/P TAVR (transcatheter aortic valve replacement); Vertigo; Eczema; Fall; Knee contusion; Pure hypercholesterolemia; Rectal bleeding; Sepsis ([place]); Altered mental status; Hypovolemia; AKI (acute kidney injury) ([place]); Severe sepsis with acute organ dysfunction [facility]); DNR (do not resuscitate); and Hypotension on their problem list.  Significant Hospital Events: Including procedures, antibiotic start and stop dates in addition to other pertinent events   12/26 admit.  Interim History / Subjective:  BCx's growing MSSA. She denies any joint or back pain, persistent fevers, night sweats, recent skin breakdown or tears. She does not have any indwelling catheters but does have an PPM in place (placed 2014). MAP 70s on 10 Levo. Still appears volume down though BNP was 770 on admit. She subjectively does feel improved and is asking to drink some fluids.  Objective:  Blood pressure (!) 128/46, pulse 79, temperature 98.5 F (36.9 C), temperature source Oral, resp. rate  17, height '5\' 5"'$  (1.651 m), weight 108.9 kg, SpO2 97 %.        Intake/Output Summary (Last 24 hours) at 03/29/2022 2841 Last data filed at  03/29/2022 0132 Gross per 24 hour  Intake 6000 ml  Output --  Net 6000 ml    Filed Weights   03/28/22 1043  Weight: 108.9 kg    Examination: General: Elderly female, chronically ill appearing, resting in bed, in NAD. Neuro: Jessica [name] opens eyes to voice. Follows commands, answers questions appropriately. HEENT: [place]/AT. Sclerae anicteric. MM remain dry. Cardiovascular: IRIR, no M/R/G.  Lungs: Respirations even and unlabored.  CTA bilaterally, No W/R/R. Abdomen: BS x 4, soft, NT/ND.  Musculoskeletal: No gross deformities, no edema.  Skin: Intact, warm, no rashes.  Labs/imaging personally reviewed:  CT/CTA head and neck 12/26 > questionable low density subdural collection over the left cerebral hemisphere along with mild chronic small vessel ischemic changes. CT abd/pelv 12/26 > negative for any acute findings and showed possible atelectasis vs basilar PNA. CT head 12/28 >  Echo 12/28 >   Assessment & Plan:   Septic Shock - 2/2 MSSA bacteremia with unclear source at this point. She denies any joint or back pain, persistent fevers, night sweats, recent skin breakdown or tears to suggest possible port of entry. She does not have any indwelling catheters but does have an PPM in place (placed 2014). Shock also exacerbated by hypovolemia in setting of decreased PO intake with N/V for the past few days.  - De-escalate abx to Ancef. - ID will get an auto-consult, appreciate the assistance. - TTE ordered, might need TEE depending on results and her course over next 24-48 hours. - Will need repeat blood cultures to ensure clearance. - Continue NE as needed for goal MAP > 65. - Additional 500cc NS now then maintenance at 100/hr. - 25% Albumin x 1 dose. - Repeat BNP. - Trend PCT.  Acute metabolic encephalopathy - presumed 2/2 above. Improved AM 12/27. - Supportive care. - Limit sedating meds. - Repeat CT head in 48 hours (see  below).  Hyponatremia. Hypokalemia. Hypophosphatemia. NAGMA. AKI. - 3 runs K. - 28mol Kphos. - 2amps HCO3. - Fluids. - Follow BMP.  Hx DM. - Fluids and SSI. - Hold home Metformin.  Presumed chronic SDH - evaluated by neurosurgery, felt to be SDH and less likely subdural empyema. - Hold Eliquis. - Repeat head CT in 48 hours.  Hx A.fib, 2nd degree AV block s/p PPM 2014, HTN, HLD, s/p TAVR - Hold Eliquis as above. - Hold home Furosemide, Toprol-XL, Entresto.  Hx Hypothyroidism. - Continue home Synthroid.   Best practice (evaluated daily):  Nutrition: Regular. VTE PPx: SCD's. SUP: N/A. Glucose: SSI, has not received this AM yet. Needs better control and goal < 180. Code Status: DNR. Family updates: None at bedside. Will call later this morning.  Critical care time: 35 min.   RMontey Hora PIlliopolisPulmonary & Critical Care Medicine For pager details, please see AMION or use Epic chat  After 1900, please call EFlower Hospitalfor cross coverage needs 03/29/2022, 7:28 AM

## 2022-03-30 ENCOUNTER — Inpatient Hospital Stay (HOSPITAL_COMMUNITY): Payer: Medicare Other

## 2022-03-30 LAB — PROCALCITONIN: Procalcitonin: 1.93 ng/mL

## 2022-03-30 LAB — TROPONIN I (HIGH SENSITIVITY)
Troponin I (High Sensitivity): 149 ng/L (ref <18)
Troponin I (High Sensitivity): 130 ng/L (ref <18)

## 2022-03-30 LAB — BASIC METABOLIC PANEL
Anion gap: 9 (ref 5–15)
BUN: 13 mg/dL (ref 8–23)
CO2: 24 mmol/L (ref 22–32)
Calcium: 8 mg/dL — ABNORMAL LOW (ref 8.9–10.3)
Chloride: 103 mmol/L (ref 98–111)
Creatinine, Ser: 0.86 mg/dL (ref 0.44–1.00)
GFR, Estimated: >60 mL/min (ref >60)
Glucose, Bld: 184 mg/dL — ABNORMAL HIGH (ref 70–99)
Potassium: 3.4 mmol/L — ABNORMAL LOW (ref 3.5–5.1)
Sodium: 136 mmol/L (ref 135–145)

## 2022-03-30 LAB — MAGNESIUM: Magnesium: 2.2 mg/dL (ref 1.7–2.4)

## 2022-03-30 LAB — BRAIN NATRIURETIC PEPTIDE: B Natriuretic Peptide: 471.9 pg/mL — ABNORMAL HIGH (ref 0.0–100.0)

## 2022-03-30 LAB — GLUCOSE, CAPILLARY
Glucose-Capillary: 200 mg/dL — ABNORMAL HIGH (ref 70–99)
Glucose-Capillary: 180 mg/dL — ABNORMAL HIGH (ref 70–99)
Glucose-Capillary: 246 mg/dL — ABNORMAL HIGH (ref 70–99)
Glucose-Capillary: 229 mg/dL — ABNORMAL HIGH (ref 70–99)
Glucose-Capillary: 223 mg/dL — ABNORMAL HIGH (ref 70–99)

## 2022-03-30 LAB — CBC
HCT: 32.1 % — ABNORMAL LOW (ref 36.0–46.0)
Hemoglobin: 10.6 g/dL — ABNORMAL LOW (ref 12.0–15.0)
MCH: 30.9 pg (ref 26.0–34.0)
MCHC: 33 g/dL (ref 30.0–36.0)
MCV: 93.6 fL (ref 80.0–100.0)
Platelets: 93 10*3/uL — ABNORMAL LOW (ref 150–400)
RBC: 3.43 MIL/uL — ABNORMAL LOW (ref 3.87–5.11)
RDW: 47.2 % — ABNORMAL HIGH (ref 11.5–15.5)
WBC: 11.1 10*3/uL — ABNORMAL HIGH (ref 4.0–10.5)
nRBC: 0 % (ref 0.0–0.2)

## 2022-03-30 LAB — PHOSPHORUS
Phosphorus: 1.5 mg/dL — ABNORMAL LOW (ref 2.5–4.6)
Phosphorus: 1.2 mg/dL — ABNORMAL LOW (ref 2.5–4.6)

## 2022-03-30 LAB — D-DIMER, QUANTITATIVE: D-Dimer, Quant: 7.32 ug/mL-FEU — ABNORMAL HIGH (ref 0.00–0.50)

## 2022-03-30 MED ORDER — POTASSIUM PHOSPHATES 15 MMOLE/5ML IV SOLN
45.0000 mmol | Freq: Once | INTRAVENOUS | Status: AC
  Administered 2022-03-30: 45 mmol via INTRAVENOUS
  Filled 2022-03-30: qty 15

## 2022-03-30 MED ORDER — K PHOS MONO-SOD PHOS DI & MONO 155-852-130 MG PO TABS
250.0000 mg | ORAL_TABLET | Freq: Three times a day (TID) | ORAL | Status: AC
  Administered 2022-03-30 (×2): 250 mg via ORAL
  Filled 2022-03-30 (×3): qty 1

## 2022-03-30 MED ORDER — POTASSIUM CHLORIDE CRYS ER 20 MEQ PO TBCR
40.0000 meq | EXTENDED_RELEASE_TABLET | Freq: Once | ORAL | Status: AC
  Administered 2022-03-30: 40 meq via ORAL
  Filled 2022-03-30: qty 2

## 2022-03-30 MED ORDER — LACTATED RINGERS IV SOLN: INTRAVENOUS | Status: DC

## 2022-03-30 NOTE — Evaluation (Signed)
Physical Therapy Evaluation Patient Details Name: Jessica [name] MRN: [id number] DOB: 06/09/1933 Today's Date: 03/30/2022  History of Present Illness  88 yo female [facility] on 12/26 with AMS, decreased intake. CT abd/pelvis shows possible atelectasis vs basilar PNA. CT and CTA head/neck which demonstrated questionable low density subdural collection over the left cerebral hemisphere along with mild chronic small vessel ischemic changes (presumed chronic). Workup for MSSA bacteremia, sepsis. PMH includes chronic diastolic heart failure, 2nd degree AV block-s/p pacemaker implantation, atrial fibrillation on Eliquis, hypertension, hyperlipidemia, chronic kidney disease, thyroid disease, TAVR, and depression.  Clinical Impression   Pt presents with generalized weakness, impaired balance at high risk for falls, impaired mobility, and decreased activity tolerance. Pt to benefit from acute PT to address deficits. Pt requiring max +2 assist for transfer-level mobility at this time, pt requiring significant truncal and hip guiding assist during step pivot from chair to bed to prevent unsteadiness/fall. Pt reports not feeling herself, states she is typically independent with mobility. Recommendations listed below. PT to progress mobility as tolerated, and will continue to follow acutely.         Recommendations for follow up therapy are one component of a multi-disciplinary discharge planning process, led by the attending physician.  Recommendations may be updated based on patient status, additional functional criteria and insurance authorization.  Follow Up Recommendations Skilled nursing-short term rehab (<3 hours/day) Can patient physically be transported by private vehicle: No    Assistance Recommended at Discharge Frequent or constant Supervision/Assistance  Patient can return home with the following  Two people to help with walking and/or transfers;Two people to help with  bathing/dressing/bathroom    Equipment Recommendations None recommended by PT  Recommendations for Other Services       Functional Status Assessment Patient has had a recent decline in their functional status and demonstrates the ability to make significant improvements in function in a reasonable and predictable amount of time.     Precautions / Restrictions Precautions Precautions: Fall Precaution Comments: watch O2 Restrictions Weight Bearing Restrictions: No      Mobility  Bed Mobility Overal bed mobility: Needs Assistance Bed Mobility: Sit to Supine       Sit to supine: Total assist, +2 for physical assistance, +2 for safety/equipment   General bed mobility comments: sitting EOB, pt feeling "nauseated" assisted back to supine with total assist    Transfers Overall transfer level: Needs assistance Equipment used: Rolling walker (2 wheels) Transfers: Bed to chair/wheelchair/BSC, Sit to/from Stand Sit to Stand: Max assist, +2 physical assistance, +2 safety/equipment   Step pivot transfers: +2 physical assistance, +2 safety/equipment, Max assist            Ambulation/Gait                  Stairs            Wheelchair Mobility    Modified Rankin (Stroke Patients Only)       Balance Overall balance assessment: Needs assistance Sitting-balance support: Feet supported, No upper extremity supported Sitting balance-Leahy Scale: Fair     Standing balance support: Bilateral upper extremity supported, During functional activity Standing balance-Leahy Scale: Poor Standing balance comment: relies on BUE and external support                             Pertinent Vitals/Pain Pain Assessment Pain Assessment: Faces Faces Pain Scale: Hurts a little bit Pain Location: chest discomfort (musculoskeletal)  Pain Descriptors / Indicators: Discomfort Pain Intervention(s): Limited activity within patient's tolerance, Monitored during session,  Repositioned    Home Living Family/patient expects to be discharged to:: Private residence Living Arrangements: Alone Available Help at Discharge: Family;Available PRN/intermittently;Friend(s) (daughter is w/c level) Type of Home: House Home Access: Stairs to enter Entrance Stairs-Rails: Left Entrance Stairs-Number of Steps: 2   Home Layout: Multi-level;Able to live on main level with bedroom/bathroom Home Equipment: Rolling Walker (2 wheels);Cane - single point      Prior Function Prior Level of Function : Independent/Modified Independent             Mobility Comments: pt reports using RW on "days where I feel weak or dizzy", otherwise furniture walks ADLs Comments: pt reports independence with dressing, bathing, and cooking. Pt stopped driving 6 months ago, neighbors take her to appointments/grocery store     Hand Dominance   Dominant Hand: Right    Extremity/Trunk Assessment   Upper Extremity Assessment Upper Extremity Assessment: Defer to OT evaluation    Lower Extremity Assessment Lower Extremity Assessment: Generalized weakness    Cervical / Trunk Assessment Cervical / Trunk Assessment: Other exceptions Cervical / Trunk Exceptions: increased body habitus  Communication   Communication: No difficulties  Cognition Arousal/Alertness: Awake/alert Behavior During Therapy: WFL for tasks assessed/performed Overall Cognitive Status: Impaired/Different from baseline Area of Impairment: Orientation, Attention, Memory, Following commands, Safety/judgement, Awareness, Problem solving                 Orientation Level: Disoriented to, Situation Current Attention Level: Sustained Memory: Decreased recall of precautions, Decreased short-term memory Following Commands: Follows one step commands consistently, Follows one step commands with increased time, Follows multi-step commands inconsistently Safety/Judgement: Decreased awareness of safety, Decreased awareness  of deficits Awareness: Emergent Problem Solving: Slow processing, Difficulty sequencing, Requires verbal cues, Decreased initiation General Comments: pt oriented but reports "I dont know why I'm here" mulitple times throughout session.  She follows simple commands with increased time but demonstrates poor problem solving, awareness and safety. Tearful talking about family members she has lost        General Comments General comments (skin integrity, edema, etc.): SpO2 91% and greater on 3LO2, HR 110s-120s during transfer    Exercises     Assessment/Plan    PT Assessment Patient needs continued PT services  PT Problem List Decreased strength;Decreased mobility;Decreased activity tolerance;Decreased balance;Decreased knowledge of use of DME;Pain;Decreased safety awareness;Decreased coordination;Cardiopulmonary status limiting activity       PT Treatment Interventions DME instruction;Therapeutic activities;Gait training;Therapeutic exercise;Patient/family education;Balance training;Neuromuscular re-education;Functional mobility training    PT Goals (Current goals can be found in the Care Plan section)  Acute Rehab PT Goals Patient Stated Goal: get stronger PT Goal Formulation: With patient Time For Goal Achievement: 04/13/22 Potential to Achieve Goals: Good    Frequency Min 2X/week     Co-evaluation PT/OT/SLP Co-Evaluation/Treatment: Yes Reason for Co-Treatment: For patient/therapist safety;To address functional/ADL transfers PT goals addressed during session: Mobility/safety with mobility;Balance OT goals addressed during session: ADL's and self-care       AM-PAC PT "6 Clicks" Mobility  Outcome Measure Help needed turning from your back to your side while in a flat bed without using bedrails?: A Lot Help needed moving from lying on your back to sitting on the side of a flat bed without using bedrails?: A Lot Help needed moving to and from a bed to a chair (including a  wheelchair)?: A Lot Help needed standing up from a chair using your arms (  e.g., wheelchair or bedside chair)?: A Lot Help needed to walk in hospital room?: A Lot Help needed climbing 3-5 steps with a railing? : Total 6 Click Score: 11    End of Session Equipment Utilized During Treatment: Oxygen Activity Tolerance: Patient limited by fatigue Patient left: in bed;with call bell/phone within reach;with bed alarm set;with nursing/sitter in room Nurse Communication: Mobility status PT Visit Diagnosis: Muscle weakness (generalized) (M62.81);Other abnormalities of gait and mobility (R26.89)    Time: 0518-3358 PT Time Calculation (min) (ACUTE ONLY): 22 min   Charges:   PT Evaluation $PT Eval Low Complexity: 1 Low        Darene Nappi S, PT DPT Acute Rehabilitation Services Pager [phone]  Office [phone]   Grimes E Ruffin Pyo 03/30/2022, 2:05 PM

## 2022-03-30 NOTE — Progress Notes (Signed)
eLink Physician-Brief Progress Note Patient Name: Jessica [name] DOB: [birth year] MRN: [id number]   Date of Service  03/30/2022  HPI/Events of Note  Patient with abrupt development of tachycardia and tachypnea, also complaining of left sided chest pain, EKG without acute changes, V-paced.  eICU Interventions  Troponin cycled, stat portable CXR to r/o pneumothorax, D-Dimer to screen for PE.        Kerry Kass Sharayah Renfrow 03/30/2022, 3:25 AM

## 2022-03-30 NOTE — Evaluation (Signed)
Occupational Therapy Evaluation Patient Details Name: Jessica [name] MRN: [id number] DOB: [birth year] Today's Date: 03/30/2022   History of Present Illness 88 yo female presents to [facility] on 12/26 with AMS, decreased intake. CT abd/pelvis shows possible atelectasis vs basilar PNA. CT and CTA head/neck which demonstrated questionable low density subdural collection over the left cerebral hemisphere along with mild chronic small vessel ischemic changes (presumed chronic). Workup for MSSA bacteremia, sepsis. PMH includes chronic diastolic heart failure, 2nd degree AV block-s/p pacemaker implantation, atrial fibrillation on Eliquis, hypertension, hyperlipidemia, chronic kidney disease, thyroid disease, TAVR, and depression.   Clinical Impression   PTA patient reports living alone, independent with ADLs and mobility. She was admitted for above and presents with problem list below.  She is oriented x 3, but unaware of situation; follows simple commands, but demonstrating decreased awareness, problem solving and safety.  She completes transfers with max assist +2, ADLs with setup to total assist +2.  Based on performance today, believe she will best benefit from continued OT services acutely and after dc at SNF level. Will follow acutely.      Recommendations for follow up therapy are one component of a multi-disciplinary discharge planning process, led by the attending physician.  Recommendations may be updated based on patient status, additional functional criteria and insurance authorization.   Follow Up Recommendations  Skilled nursing-short term rehab (<3 hours/day)     Assistance Recommended at Discharge Frequent or constant Supervision/Assistance  Patient can return home with the following Two people to help with walking and/or transfers;Two people to help with bathing/dressing/bathroom;Assistance with cooking/housework;Direct supervision/assist for medications management;Direct  supervision/assist for financial management;Help with stairs or ramp for entrance;Assist for transportation    Functional Status Assessment  Patient has had a recent decline in their functional status and demonstrates the ability to make significant improvements in function in a reasonable and predictable amount of time.  Equipment Recommendations  BSC/3in1    Recommendations for Other Services       Precautions / Restrictions Precautions Precautions: Fall Precaution Comments: watch O2 Restrictions Weight Bearing Restrictions: No      Mobility Bed Mobility Overal bed mobility: Needs Assistance Bed Mobility: Sit to Supine       Sit to supine: Total assist, +2 for physical assistance, +2 for safety/equipment   General bed mobility comments: sitting EOB, pt feeling "nauseated" assisted back to supine with total assist    Transfers Overall transfer level: Needs assistance Equipment used: Rolling walker (2 wheels) Transfers: Bed to chair/wheelchair/BSC, Sit to/from Stand Sit to Stand: Max assist, +2 physical assistance, +2 safety/equipment     Step pivot transfers: +2 physical assistance, +2 safety/equipment, Max assist            Balance Overall balance assessment: Needs assistance Sitting-balance support: Feet supported, No upper extremity supported Sitting balance-Leahy Scale: Fair     Standing balance support: Bilateral upper extremity supported, During functional activity Standing balance-Leahy Scale: Poor Standing balance comment: relies on BUE and external support                           ADL either performed or assessed with clinical judgement   ADL Overall ADL's : Needs assistance/impaired     Grooming: Wash/dry face;Wash/dry hands;Set up;Sitting           Upper Body Dressing : Minimal assistance;Sitting   Lower Body Dressing: Total assistance;+2 for safety/equipment;+2 for physical assistance;Sit to/from stand   Toilet Transfer:  Maximal  assistance;+2 for physical assistance;+2 for safety/equipment;Stand-pivot;Rolling walker (2 wheels) Toilet Transfer Details (indicate cue type and reason): simulated to recliner Toileting- Clothing Manipulation and Hygiene: Total assistance;+2 for physical assistance;+2 for safety/equipment;Sit to/from stand       Functional mobility during ADLs: Maximal assistance;+2 for physical assistance;+2 for safety/equipment;Rolling walker (2 wheels) General ADL Comments: VSS on 3L O2     Vision   Vision Assessment?: No apparent visual deficits     Perception     Praxis      Pertinent Vitals/Pain Pain Assessment Pain Assessment: Faces Faces Pain Scale: Hurts a little bit Pain Location: chest discomfort Pain Descriptors / Indicators: Discomfort Pain Intervention(s): Limited activity within patient's tolerance, Monitored during session, Repositioned     Hand Dominance Right   Extremity/Trunk Assessment Upper Extremity Assessment Upper Extremity Assessment: Generalized weakness   Lower Extremity Assessment Lower Extremity Assessment: Defer to PT evaluation   Cervical / Trunk Assessment Cervical / Trunk Assessment: Other exceptions Cervical / Trunk Exceptions: increased body habitus   Communication Communication Communication: No difficulties   Cognition Arousal/Alertness: Awake/alert Behavior During Therapy: WFL for tasks assessed/performed Overall Cognitive Status: Impaired/Different from baseline Area of Impairment: Orientation, Attention, Memory, Following commands, Safety/judgement, Awareness, Problem solving                 Orientation Level: Disoriented to, Situation Current Attention Level: Sustained Memory: Decreased recall of precautions, Decreased short-term memory Following Commands: Follows one step commands consistently, Follows one step commands with increased time, Follows multi-step commands inconsistently Safety/Judgement: Decreased awareness of  safety, Decreased awareness of deficits Awareness: Emergent Problem Solving: Slow processing, Difficulty sequencing, Requires verbal cues, Decreased initiation General Comments: pt oriented but reports "I dont know why I'm here" mulitple times throughout session.  She follows simple commands with increased time but demonstrates poor problem solving, awareness and safety .     General Comments  VSS on 3L [place]    Exercises     Shoulder Instructions      Home Living Family/patient expects to be discharged to:: Private residence Living Arrangements: Alone Available Help at Discharge: Family;Available PRN/intermittently;Friend(s) (daughter is w/c level) Type of Home: House Home Access: Stairs to enter [employer] of Steps: 2 Entrance Stairs-Rails: Left Home Layout: Multi-level;Able to live on main level with bedroom/bathroom     Bathroom Shower/Tub: Sponge bathes at baseline   Bathroom Toilet: Standard     Home Equipment: Conservation officer, nature (2 wheels);Cane - single point          Prior Functioning/Environment Prior Level of Function : Independent/Modified Independent             Mobility Comments: pt reports using RW on "days where I feel weak or dizzy", otherwise furniture walks ADLs Comments: pt reports independence with dressing, bathing, and cooking. Pt stopped driving 6 months ago, neighbors take her to appointments/grocery store        OT Problem List: Decreased strength;Decreased activity tolerance;Impaired balance (sitting and/or standing);Decreased cognition;Decreased safety awareness;Decreased knowledge of use of DME or AE;Decreased knowledge of precautions;Pain;Cardiopulmonary status limiting activity;Obesity      OT Treatment/Interventions: Self-care/ADL training;Therapeutic exercise;DME and/or AE instruction;Therapeutic activities;Patient/family education;Balance training;Cognitive remediation/compensation    OT Goals(Current goals can be found in the  care plan section) Acute Rehab OT Goals Patient Stated Goal: home OT Goal Formulation: With patient Time For Goal Achievement: 04/13/22 Potential to Achieve Goals: Fair  OT Frequency: Min 2X/week    Co-evaluation PT/OT/SLP Co-Evaluation/Treatment: Yes Reason for Co-Treatment: For patient/therapist safety;To address functional/ADL transfers  OT goals addressed during session: ADL's and self-care      AM-PAC OT "6 Clicks" Daily Activity     Outcome Measure Help from another person eating meals?: A Little Help from another person taking care of personal grooming?: A Little Help from another person toileting, which includes using toliet, bedpan, or urinal?: Total Help from another person bathing (including washing, rinsing, drying)?: A Lot Help from another person to put on and taking off regular upper body clothing?: A Little Help from another person to put on and taking off regular lower body clothing?: Total 6 Click Score: 13   End of Session Equipment Utilized During Treatment: Rolling walker (2 wheels);Oxygen Nurse Communication: Mobility status;Precautions  Activity Tolerance: Patient tolerated treatment well Patient left: in bed;with call bell/phone within reach;with bed alarm set;with nursing/sitter in room  OT Visit Diagnosis: Other abnormalities of gait and mobility (R26.89);Muscle weakness (generalized) (M62.81)                Time: 6004-5997 OT Time Calculation (min): 22 min Charges:  OT General Charges $OT Visit: 1 Visit OT Evaluation $OT Eval Moderate Complexity: 1 Mod  Jolaine Artist, OT Acute Rehabilitation Services Office [phone]   [name] 03/30/2022, 11:23 AM

## 2022-03-30 NOTE — Inpatient Diabetes Management (Signed)
Inpatient Diabetes Program Recommendations  AACE/ADA: New Consensus Statement on Inpatient Glycemic Control (2015)  Target Ranges:  Prepandial:   less than 140 mg/dL      Peak postprandial:   less than 180 mg/dL (1-2 hours)      Critically ill patients:  140 - 180 mg/dL   Lab Results  Component Value Date   GLUCAP 246 (H) 03/30/2022   HGBA1C 10.0 (H) 12/26/2021    Review of Glycemic Control  Latest Reference Range & Units 03/30/22 07:16 03/30/22 11:52  Glucose-Capillary 70 - 99 mg/dL 180 (H) 246 (H)  (H): Data is abnormally high  Diabetes history: DM  Outpatient Diabetes medications: metformin 500 mg bid, prandin 4 mg tid   Current orders for Inpatient glycemic control: Novolog 0-20 units TID and 0-5 units QHS  Inpatient Diabetes Program Recommendations:    Please consider:  Novolog 2 units TID with meals if she consumes at least 50%.  Will continue to follow while inpatient.  Thank you, Reche Dixon, MSN, [place] Diabetes Coordinator Inpatient Diabetes Program [phone] (team pager from 8a-5p)

## 2022-03-30 NOTE — Progress Notes (Signed)
NAME:  Jessica [name], MRN:  [id number], DOB:  03-21-1934, LOS: 2 ADMISSION DATE:  03/28/2022, CONSULTATION DATE:  03/28/22 REFERRING MD:  Lorin Mercy CHIEF COMPLAINT:  AMS   History of Present Illness:  Jessica [name] is a 88 y.o. female who has a PMH as below. She presented to [facility] ED 12/26 with AMS. She had not been feeling well for a few days prior to this and had decreased PO intake, N/V, and dysuria. Due to not feeling well, she has essentially been bed ridden for the past few days.  In ED, she was febrile to 101.7 and had soft blood pressures with systolics in the 67E. One charting does show a rectal temp of 104 and repeat oral temp 3 hours later was 97.7. She received 1.5L NS bolus and additional fluids were withheld due to concern for pulmonary edema and new O2 requirement. Her BP had minimal improvement; therefore, PCCM asked to see in consultation. She had CT of abd/pelv which was negative for any acute findings and showed possible atelectasis vs basilar PNA. She also had CT and CTA head/neck which demonstrated questionable low density subdural collection over the left cerebral hemisphere along with mild chronic small vessel ischemic changes. Neurosurgery was consulted and felt that her imaging represented a chronic SDH with subdural empyema being less likely given the fact that she did not have any marked edema or mass effect or any ventricular involvement. They recommended holding Eliquis and repeating head CT in 48 hours. She is not able to get an MRI due to her pacemaker.  At the time of our evaluation, her SBP ranges from high 70s to high 80s. She is awake and tells me "I feel ok but not great". She endorses decreased PO intake for about 2 days and nausea with vomiting. She has very dry lips and mucous membranes and it is difficult for her to articulate clearly. She is able to move all extremities against resistance and is equal bilaterally.  She had persistent hypotension after  additional fluids; therefore, was started on Levophed and upgraded to ICU.  Pertinent  Medical History:  has Hypothyroidism; DM type 2, controlled, with complication ([place]); HLD (hyperlipidemia); OBESITY, MORBID; Anxiety disorder; Grieving; CARPAL TUNNEL SYNDROME; Essential hypertension; DUODENITIS, WITHOUT HEMORRHAGE; HIATAL HERNIA; Diverticulosis of colon without hemorrhage; Osteoarthritis; Low back pain; GERD (gastroesophageal reflux disease); Foot pain, left; Gastrocnemius strain; Left carotid artery stenosis; Paronychia of finger, left; Erythema nodosum; Chronic venous insufficiency; DOE (dyspnea on exertion); Chronic diastolic heart failure ([place]); Atrial fibrillation and flutter ([place]); Severe aortic stenosis; History of pulmonary embolism; Presence of permanent cardiac pacemaker; Pulmonary nodule; S/P TAVR (transcatheter aortic valve replacement); Vertigo; Eczema; Fall; Knee contusion; Pure hypercholesterolemia; Rectal bleeding; Sepsis ([place]); Altered mental status; Hypovolemia; AKI (acute kidney injury) ([place]); Severe sepsis with acute organ dysfunction [facility]); DNR (do not resuscitate); and Hypotension on their problem list.  Significant Hospital Events: Including procedures, antibiotic start and stop dates in addition to other pertinent events   12/26 admit.  Interim History / Subjective:  BCx's growing MSSA. She denies any joint or back pain, persistent fevers, night sweats, recent skin breakdown or tears. She does not have any indwelling catheters but does have an PPM in place (placed 2014). MAP 70s on 10 Levo. Still appears volume down though BNP was 770 on admit. She subjectively does feel improved and is asking to drink some fluids.  Objective:  Blood pressure (!) 106/58, pulse (!) 115, temperature 98 F (36.7 C), temperature source Oral, resp.  rate (!) 30, height '5\' 5"'$  (1.651 m), weight 116.2 kg, SpO2 97 %.        Intake/Output Summary (Last 24 hours) at 03/30/2022 0935 Last data  filed at 03/30/2022 6834 Gross per 24 hour  Intake 2850.87 ml  Output 2150 ml  Net 700.87 ml    Filed Weights   03/28/22 1043 03/30/22 0500  Weight: 108.9 kg 116.2 kg    Examination: No distress Chronically ill appearing Lungs clear Abd soft Moves ext to command  Repeat head CT pending K/Phos being repleted   Assessment & Plan:  MSSA bacteremia Septic shock- resolved Hypovolemia- improved Morbid obesity AKI- improved Acute metabolic encephalopathy- improved Hyponatremia DM2 Presumed chronic SDH NAGMA Chronic atrial fibrillation S/p TAVR Hypothyroidism  - Repeat head CT - ID following to help with figuring out source control (needs TEE). - Ancef - Progressive mobility, PT/OT - SSI Off pressors, stable for floor   Erskine [name] MD PCCM

## 2022-03-30 NOTE — Progress Notes (Signed)
Pt having c/o burning in arm that has K-phos infusing. Attempted to turn down rate to alleviate pain but pt did not have relief at lower rate. Infusion stopped at pt's request. Pt has been tolerating PO intake w/o difficulty. E-link RN notified of situation and reported she will notify E-link MD.

## 2022-03-30 NOTE — Progress Notes (Signed)
[facility] ADULT ICU REPLACEMENT PROTOCOL   The patient does apply for the [facility] Adult ICU Electrolyte Replacment Protocol based on the criteria listed below:   1.Exclusion criteria: TCTS, ECMO, Dialysis, and Myasthenia Gravis patients 2. Is GFR >/= 30 ml/min? Yes.    Patient's GFR today is >60 3. Is SCr </= 2? Yes.   Patient's SCr is 0.86 mg/dL 4. Did SCr increase >/= 0.5 in 24 hours? No. 5.Pt's weight >40kg  Yes.   6. Abnormal electrolyte(s): phos 1.5, potassium 3.4  7. Electrolytes replaced per protocol 8.  Call MD STAT for K+ </= 2.5, Phos </= 1, or Mag </= 1 Physician:  n/a  Jessica [name] 03/30/2022 4:59 AM

## 2022-03-30 NOTE — Progress Notes (Addendum)
I have seen and examined the patient. I have personally reviewed the clinical findings, laboratory findings, microbiological data and imaging studies. The assessment and treatment plan was discussed with the  APP, [name].   I agree with their recommendations with the following comments:  88 year old woman admitted with MSSA bacteremia.  Etiology not entirely clear but possibly related to pneumonia.  Her bacteremia is complicated by the presence of a TAVR and PPM.  She will undergo TEE on Wednesday, 04/05/2021 for further evaluation of her valves and pacemaker.  Typically, in the setting of Staph aureus bacteremia this would be an indication for PPM extraction if feasible.  However, the presence of her TAVR likely indicates that she will be on some form of suppressive antibiotics following bacteremia treatment.  Her blood cultures from yesterday remain no growth and her bacteremia was not high-grade with only 1 out of 2 sets positive.  Recommend continuing cefazolin and awaiting TEE.  Also recommend engagement with EP for their recommendations on pacemaker strategy.  I will be here tomorrow.  Dr. Candiss Norse takes over Sunday and Monday.   Raynelle Highland for Infectious Disease [facility] 03/31/2022, [place] [facility] Infectious [facility]  Date of Admission:  03/28/2022      Total days of antibiotics 3  Cefazolin 12/27 >c          ASSESSMENT: Jessica [name] is a 88 y.o. female admitted with sepsis related to MSSA bacteremia (1/2 sets) complicated by PPM and TAVR valve in place. Source of bacteremia likely pneumonia based on clinical picture (may have aspiration component as well) but would presume cardiac device is involved given virulent organism in the blood. TEE would be helpful to confirm if she is a candidate for this.  Not clear removing the PPM would make much of an impact given retained valve. May need attempt at chronic  suppression following acute treatment.  Continue cefazolin for now. DDI with rifampin with her chronic AC.  Follow up repeated blood cultures   There was a concern on imaging over possible subdural empyema vs hematoma - NSGY is following and feel less likely empyema. Planning re-eval imaging in 48h.     PLAN: Continue cefazolin Follow repeat blood cultures for clearance.  Recommend TEE for eval of TAVR and PPM device  EP evaluation for consideration of PPM extraction     Principal Problem:   Severe sepsis with acute organ dysfunction (HCC) Active Problems:   Hypothyroidism   DM type 2, controlled, with complication (HCC)   HLD (hyperlipidemia)   OBESITY, MORBID   Anxiety disorder   Essential hypertension   Chronic diastolic heart failure (HCC)   Atrial fibrillation and flutter (HCC)   Presence of permanent cardiac pacemaker   Altered mental status   AKI (acute kidney injury) ([place])   DNR (do not resuscitate)   Hypotension    Chlorhexidine Gluconate Cloth  6 each Topical Daily   insulin aspart  0-20 Units Subcutaneous TID WC   insulin aspart  0-5 Units Subcutaneous QHS   levothyroxine  75 mcg Oral Q0600   mouth rinse  15 mL Mouth Rinse 4 times per day   phosphorus  250 mg Oral Q8H   sodium chloride flush  3 mL Intravenous Q12H   venlafaxine XR  75 mg Oral Q breakfast     [name], MSN, NP-C [facility] for Infectious Disease [email or web address]  Pager: [phone] Office: [phone] [facility] Main Line: [place] Communication Welcome

## 2022-03-31 DIAGNOSIS — R6520 Severe sepsis without septic shock: Secondary | ICD-10-CM | POA: Diagnosis not present

## 2022-03-31 DIAGNOSIS — A419 Sepsis, unspecified organism: Secondary | ICD-10-CM | POA: Diagnosis not present

## 2022-03-31 LAB — GLUCOSE, CAPILLARY
Glucose-Capillary: 160 mg/dL — ABNORMAL HIGH (ref 70–99)
Glucose-Capillary: 197 mg/dL — ABNORMAL HIGH (ref 70–99)
Glucose-Capillary: 176 mg/dL — ABNORMAL HIGH (ref 70–99)
Glucose-Capillary: 161 mg/dL — ABNORMAL HIGH (ref 70–99)
Glucose-Capillary: 238 mg/dL — ABNORMAL HIGH (ref 70–99)

## 2022-03-31 LAB — LACTIC ACID, PLASMA: Lactic Acid, Venous: 1.2 mmol/L (ref 0.5–1.9)

## 2022-03-31 LAB — COMPREHENSIVE METABOLIC PANEL
ALT: 25 U/L (ref 0–44)
AST: 52 U/L — ABNORMAL HIGH (ref 15–41)
Albumin: 2.2 g/dL — ABNORMAL LOW (ref 3.5–5.0)
Alkaline Phosphatase: 57 U/L (ref 38–126)
Anion gap: 7 (ref 5–15)
BUN: 9 mg/dL (ref 8–23)
CO2: 25 mmol/L (ref 22–32)
Calcium: 7.9 mg/dL — ABNORMAL LOW (ref 8.9–10.3)
Chloride: 104 mmol/L (ref 98–111)
Creatinine, Ser: 0.74 mg/dL (ref 0.44–1.00)
GFR, Estimated: >60 mL/min (ref >60)
Glucose, Bld: 216 mg/dL — ABNORMAL HIGH (ref 70–99)
Potassium: 3.9 mmol/L (ref 3.5–5.1)
Sodium: 136 mmol/L (ref 135–145)
Total Bilirubin: 1 mg/dL (ref 0.3–1.2)
Total Protein: 5.6 g/dL — ABNORMAL LOW (ref 6.5–8.1)

## 2022-03-31 LAB — MAGNESIUM: Magnesium: 2.2 mg/dL (ref 1.7–2.4)

## 2022-03-31 LAB — CULTURE, BLOOD (ROUTINE X 2): Special Requests: ADEQUATE

## 2022-03-31 LAB — CBC
HCT: 30.9 % — ABNORMAL LOW (ref 36.0–46.0)
Hemoglobin: 10.4 g/dL — ABNORMAL LOW (ref 12.0–15.0)
MCH: 31.1 pg (ref 26.0–34.0)
MCHC: 33.7 g/dL (ref 30.0–36.0)
MCV: 92.5 fL (ref 80.0–100.0)
Platelets: DECREASED 10*3/uL (ref 150–400)
RBC: 3.34 MIL/uL — ABNORMAL LOW (ref 3.87–5.11)
RDW: 13.9 % (ref 11.5–15.5)
WBC: 8 10*3/uL (ref 4.0–10.5)
nRBC: 0 % (ref 0.0–0.2)

## 2022-03-31 LAB — PHOSPHORUS: Phosphorus: 1.5 mg/dL — ABNORMAL LOW (ref 2.5–4.6)

## 2022-03-31 MED ORDER — INSULIN GLARGINE-YFGN 100 UNIT/ML ~~LOC~~ SOLN
12.0000 [IU] | Freq: Every day | SUBCUTANEOUS | Status: DC
  Administered 2022-03-31: 12 [IU] via SUBCUTANEOUS
  Filled 2022-03-31 (×2): qty 0.12

## 2022-03-31 MED ORDER — METOPROLOL TARTRATE 25 MG PO TABS
25.0000 mg | ORAL_TABLET | Freq: Two times a day (BID) | ORAL | Status: DC
  Administered 2022-03-31 – 2022-04-01 (×3): 25 mg via ORAL
  Filled 2022-03-31 (×3): qty 1

## 2022-03-31 MED ORDER — ENOXAPARIN SODIUM 120 MG/0.8ML IJ SOSY
120.0000 mg | PREFILLED_SYRINGE | Freq: Two times a day (BID) | INTRAMUSCULAR | Status: DC
  Administered 2022-03-31 – 2022-04-09 (×18): 120 mg via SUBCUTANEOUS
  Filled 2022-03-31 (×23): qty 0.8

## 2022-03-31 MED ORDER — INSULIN ASPART 100 UNIT/ML IJ SOLN
0.0000 [IU] | Freq: Every day | INTRAMUSCULAR | Status: DC
  Administered 2022-03-31 – 2022-04-01 (×2): 2 [IU] via SUBCUTANEOUS

## 2022-03-31 MED ORDER — INSULIN ASPART 100 UNIT/ML IJ SOLN
0.0000 [IU] | Freq: Three times a day (TID) | INTRAMUSCULAR | Status: DC
  Administered 2022-03-31 – 2022-04-01 (×2): 2 [IU] via SUBCUTANEOUS
  Administered 2022-04-01 (×2): 3 [IU] via SUBCUTANEOUS
  Administered 2022-04-02: 2 [IU] via SUBCUTANEOUS
  Administered 2022-04-02: 1 [IU] via SUBCUTANEOUS
  Administered 2022-04-03: 2 [IU] via SUBCUTANEOUS
  Administered 2022-04-04 – 2022-04-06 (×4): 1 [IU] via SUBCUTANEOUS
  Administered 2022-04-06 – 2022-04-07 (×2): 2 [IU] via SUBCUTANEOUS
  Administered 2022-04-08: 1 [IU] via SUBCUTANEOUS
  Administered 2022-04-09: 2 [IU] via SUBCUTANEOUS

## 2022-03-31 MED ORDER — K PHOS MONO-SOD PHOS DI & MONO 155-852-130 MG PO TABS
500.0000 mg | ORAL_TABLET | Freq: Two times a day (BID) | ORAL | Status: AC
  Administered 2022-03-31 (×2): 500 mg via ORAL
  Filled 2022-03-31 (×2): qty 2

## 2022-03-31 NOTE — Progress Notes (Signed)
PROGRESS NOTE  Jessica [name]  DOB: 17-Apr-1933  PCP: [name], MD KTG:[id number]  DOA: 03/28/2022  LOS: 3 days  Hospital Day: 4  Brief narrative: Jessica [name] is a 88 y.o. female with PMH significant for morbid obesity, DM2, HTN, HLD, A-fib/flutter on Eliquis, second-degree AV block, s/p PPM, chronic diastolic CHF, AS s/p TAVR, history of PE, GERD, hypothyroidism, osteoarthritis, anxiety/depression. 12/26, patient was brought to the ED by EMS from home for altered mental status. Per report, she had not been feeling well for few days, had decreased oral intake, nausea, vomiting and dysuria and essentially bedridden for the last few days.  In the ED, patient had a fever of 101.7, blood pressure low in 90s.  Labs showed WBC count at 13.3, lactic acid elevated to 4.7 CT abdomen pelvis was negative for any acute findings, showed possible atelectasis versus bilateral pneumonia CT head and CTA head and neck were obtained which showed questionable low-density subdural collection over the left cerebral hemisphere along with mild chronic small vessel ischemic changes. EDP discussed with neurosurgery, suspected the imaging findings were due to chronic SDH.  Recommended holding Eliquis and repeating CT head in 48 hours.  Initially admitted to [facility] Because of low blood pressure, she was given IV hydration but was limited due to pulmonary edema and new oxygen requirement.  Blood pressure remained low and hence PCCM was consulted. She was started on Levophed and upgraded to ICU. Blood culture sent on admission grew MSSA ID was consulted.  12/29, transferred out to [facility]  Subjective: Patient was seen and examined this morning.  Pleasant elderly Caucasian female.  Lying on bed.  Not in distress.  No new symptoms.  Was being cleaned by nursing staff.  No family at bedside. Chart reviewed In the last 24 hours, no fever, remains tachycardic under 120, blood pressure stable, respiratory  status seems improving, on 2 to 3 L oxygen by nasal cannula. Blood sugar level 160 this morning, Last blood work from yesterday with possible really low at 1.2  Assessment and plan: Septic shock -POA MSSA bacteremia Presented with fever, lethargy, leukocytosis, lactic acid elevation Blood culture sent on admission grew MSSA.  Source unclear at this time ID following.  Currently on IV Ancef ID recommended TEE for evaluation of TAVR and PPM device.  Also recommended consideration of PPM extraction.  Called cardiology today. WBC count improving.  Repeat WBC and lactic acid level today showed improvement. Recent Labs  Lab 03/28/22 1041 03/28/22 1123 03/28/22 1241 03/28/22 1732 03/28/22 1947 03/29/22 0522 03/30/22 0347 03/31/22 1053  WBC  --  13.3*  --   --   --  16.2* 11.1* 8.0  LATICACIDVEN 4.7*  --  3.7*  --  2.9*  --   --  1.2  PROCALCITON  --   --   --  4.41  --  3.38 1.93  --    HypOtension History of chronic diastolic CHF Initially blood pressure low because of shock.  Required pressors.  Blood pressure improved PTA on Toprol 50 mg daily, Lasix 40 mg daily, Entresto 97/103 mg twice daily. Patient is tachycardic for last 24 hours.  Will resume metoprolol today.  Keep Lasix and Entresto on hold  A-fib/flutter second-degree AV block s/p PPM Continue to monitor in telemetry Resume Toprol today. Eliquis currently remains on hold.  I will switch to full dose Lovenox.  Uncontrolled type 2 diabetes mellitus with hyperglycemia A1c 10 on 12/26/2021 PTA on Prandin 4 mg Premeal 3  times daily, metformin 500 mg twice daily Currently on sliding scale insulin with Accu-Cheks only.  Blood sugar level running elevated over 200 mostly.  Start Lantus 12 units this afternoon. Recent Labs  Lab 03/30/22 1152 03/30/22 1558 03/30/22 2124 03/31/22 0629 03/31/22 1146  GLUCAP 246* 229* 223* 160* 440*   AKI Metabolic acidosis Creatinine was elevated to 1.3 on admission.  Improved with  hydration.  Serum bicarb level improved as well. Recent Labs    06/22/21 1214 09/22/21 1151 12/26/21 1218 03/28/22 1123 03/29/22 0522 03/30/22 0347 03/31/22 1053  BUN 19 21 24* '17 18 13 9  '$ CREATININE 0.90 0.90 0.94 1.31* 1.10* 0.86 0.74  CO2 '27 26 25 '$ 20* 18* 24 25   Hyponatremia Sodium level improved as below. Recent Labs  Lab 03/28/22 1058 03/28/22 1123 03/29/22 0522 03/30/22 0347 03/31/22 1053  NA 133* 133* 127* 136 136   Hypokalemia/hypophosphatemia Potassium level improved.  Phosphorus level low at 1.5 today.  Replacement ordered. Recent Labs  Lab 03/28/22 1058 03/28/22 1123 03/29/22 0522 03/30/22 0347 03/30/22 1927 03/31/22 1053  K 3.9 3.9 3.4* 3.4*  --  3.9  MG  --   --  2.0 2.2  --  2.2  PHOS  --   --  2.2* 1.5* 1.2* 1.5*   Acute metabolic encephalopathy Secondary to septic shock. Improved.  Presumed chronic SDH Initial CT head finding as above showing subdural collection. Repeat CT head 12/28 without changes.  No need of intervention per neurosurgery.  Hyperlipidemia PTA on Zetia 10 mg daily  Hypothyroidism Synthroid 75 mg daily  Anxiety/depression PTA on Effexor, Restoril, Ativan as needed.   Goals of care   Code Status: DNR    Mobility: PT eval obtained.  SNF recommended  Scheduled Meds:  Chlorhexidine Gluconate Cloth  6 each Topical Daily   insulin aspart  0-5 Units Subcutaneous QHS   insulin aspart  0-9 Units Subcutaneous TID WC   insulin glargine-yfgn  12 Units Subcutaneous QHS   levothyroxine  75 mcg Oral Q0600   metoprolol tartrate  25 mg Oral BID   mouth rinse  15 mL Mouth Rinse 4 times per day   phosphorus  500 mg Oral BID   sodium chloride flush  3 mL Intravenous Q12H   venlafaxine XR  75 mg Oral Q breakfast    PRN meds: acetaminophen **OR** acetaminophen, docusate sodium, ondansetron **OR** ondansetron (ZOFRAN) IV, mouth rinse, polyethylene glycol   Infusions:   sodium chloride Stopped (03/28/22 2304)    ceFAZolin  (ANCEF) IV 2 g (03/31/22 3474)    Skin assessment:     Nutritional status:  Body mass index is 49.8 kg/m.          Diet:  Diet Order             Diet Carb Modified Fluid consistency: Thin; Room service appropriate? Yes  Diet effective ____                   DVT prophylaxis:  Place and maintain sequential compression device Start: 03/29/22 0749 SCDs Start: 03/28/22 1953   Antimicrobials: Ancef IV Fluid: None Consultants: Cardiology, ID Family Communication: None at bedside  Status is: Inpatient  Continue in-hospital care because: Ongoing workup Level of care: Progressive   Dispo: The patient is from: Home              Anticipated d/c is to: Pending clinical course              Patient currently  is not medically stable to d/c.   Difficult to place patient No    Antimicrobials: Anti-infectives (From admission, onward)    Start     Dose/Rate Route Frequency Ordered Stop   03/30/22 1300  vancomycin (VANCOREADY) IVPB 1500 mg/300 mL  Status:  Discontinued        1,500 mg 150 mL/hr over 120 Minutes Intravenous Every 48 hours 03/28/22 1315 03/29/22 0735   03/29/22 1000  ceFAZolin (ANCEF) IVPB 2g/100 mL premix        2 g 200 mL/hr over 30 Minutes Intravenous Every 8 hours 03/29/22 0742     03/29/22 0000  metroNIDAZOLE (FLAGYL) IVPB 500 mg  Status:  Discontinued        500 mg 100 mL/hr over 60 Minutes Intravenous Every 12 hours 03/28/22 1837 03/29/22 0735   03/28/22 2200  ceFEPIme (MAXIPIME) 2 g in sodium chloride 0.9 % 100 mL IVPB  Status:  Discontinued        2 g 200 mL/hr over 30 Minutes Intravenous Every 12 hours 03/28/22 1851 03/29/22 0735   03/28/22 1430  metroNIDAZOLE (FLAGYL) IVPB 500 mg        500 mg 100 mL/hr over 60 Minutes Intravenous  Once 03/28/22 1427 03/28/22 1655   03/28/22 1130  vancomycin (VANCOREADY) IVPB 2000 mg/400 mL        2,000 mg 200 mL/hr over 120 Minutes Intravenous  Once 03/28/22 1129 03/28/22 1457   03/28/22 1045  ceFEPIme  (MAXIPIME) 2 g in sodium chloride 0.9 % 100 mL IVPB        2 g 200 mL/hr over 30 Minutes Intravenous  Once 03/28/22 1042 03/28/22 1203       Objective: Vitals:   03/31/22 0432 03/31/22 0734  BP: 123/73 126/77  Pulse:  (!) 118  Resp:  20  Temp: 98.5 F (36.9 C) 97.7 F (36.5 C)  SpO2:  96%    Intake/Output Summary (Last 24 hours) at 03/31/2022 1357 Last data filed at 03/31/2022 0435 Gross per 24 hour  Intake 312.04 ml  Output 350 ml  Net -37.96 ml   Filed Weights   03/30/22 0500 03/30/22 1348 03/31/22 0432  Weight: 116.2 kg 122.9 kg 123.5 kg   Weight change: 6.7 kg Body mass index is 49.8 kg/m.   Physical Exam: General exam: Pleasant, elderly, not in pain Skin: No rashes, lesions or ulcers. HEENT: Atraumatic, normocephalic, no obvious bleeding Lungs: Clear to auscultation bilaterally CVS: Regular rate and rhythm, no murmur GI/Abd soft, nontender, nondistended, bowel sound present CNS: Alert, awake, oriented to place and person Psychiatry: Mood appropriate Extremities: No pedal edema, no calf tenderness  Data Review: I have personally reviewed the laboratory data and studies available.  F/u labs ordered Unresulted Labs (From admission, onward)    None       Total time spent in review of labs and imaging, patient evaluation, formulation of plan, documentation and communication with family 35 minutes  Signed, Terrilee Croak, MD [facility] 03/31/2022

## 2022-03-31 NOTE — TOC Initial Note (Signed)
Transition of Care [facility]) - Initial/Assessment Note    Patient Details  Name: Jessica [name] MRN: [id number] Date of Birth: June 07, 1933  [facility] of [facility]) CM/SW Contact:    Bethann Berkshire, [place] Phone Number: 03/31/2022, 4:16 PM  Clinical Narrative:                  CSW met with pt and pt niece bedside. Explained SNF rec. Pt and niece are agreeable to SNF workup. CSW explained workup and insurance auth process. Fl2 completed and bed requests faxed in hub. TOC will follow up with bed offers.   Expected Discharge Plan: Skilled Nursing Facility Barriers to Discharge: Continued Medical Work up   Patient Goals and CMS Choice            Expected Discharge Plan and Services       Living arrangements for the past 2 months: [place]                                      Prior Living Arrangements/Services Living arrangements for the past 2 months: [place] Lives with:: Self Patient language and need for interpreter reviewed:: Yes        Need for Family Participation in Patient Care: Yes (Comment) Care giver support system in place?: Yes (comment)   Criminal Activity/Legal Involvement Pertinent to Current Situation/Hospitalization: No - Comment as needed  Activities of Daily Living Home Assistive Devices/Equipment: CBG Meter ADL Screening (condition at time of admission) Patient's cognitive ability adequate to safely complete daily activities?: Yes Is the patient deaf or have difficulty hearing?: No Does the patient have difficulty seeing, even when wearing glasses/contacts?: No Does the patient have difficulty concentrating, remembering, or making decisions?: No Patient able to express need for assistance with ADLs?: Yes Does the patient have difficulty dressing or bathing?: No Independently performs ADLs?: Yes (appropriate for developmental age) Does the patient have difficulty walking or climbing stairs?: Yes Weakness of  Legs: Both Weakness of Arms/Hands: None  Permission Sought/Granted   Permission granted to share information with : Yes, Verbal Permission Granted  Share Information with NAME: daughter and niece           Emotional Assessment Appearance:: Appears stated age Attitude/Demeanor/Rapport: Engaged Affect (typically observed): Accepting Orientation: : Oriented to Self, Oriented to Place, Oriented to  Time, Oriented to Situation Alcohol / Substance Use: Not Applicable Psych Involvement: No (comment)  Admission diagnosis:  Hypoxia [R09.02] Hypotension [I95.9] Severe sepsis with acute organ dysfunction ([place]) [A41.9, R65.20] Altered mental status, unspecified altered mental status type [R41.82] Pneumonia of both lower lobes due to infectious organism [J18.9] Sepsis, due to unspecified organism, unspecified whether acute organ dysfunction present [facility]) [A41.9] Patient Active Problem List   Diagnosis Date Noted   Sepsis ([place]) 03/28/2022   Altered mental status 03/28/2022   Hypovolemia 03/28/2022   AKI (acute kidney injury) ([place]) 03/28/2022   Severe sepsis with acute organ dysfunction ([place]) 03/28/2022   DNR (do not resuscitate) 03/28/2022   Hypotension 03/28/2022   Rectal bleeding    Pure hypercholesterolemia 01/28/2021   Fall 12/22/2020   Knee contusion 12/22/2020   Eczema 09/09/2019   Vertigo 03/05/2019   S/P TAVR (transcatheter aortic valve replacement) 01/14/2019   Presence of permanent cardiac pacemaker    Pulmonary nodule    Severe aortic stenosis    Atrial fibrillation and flutter ([place]) 07/05/2017   Chronic diastolic  heart failure (HCC)    DOE (dyspnea on exertion) 04/18/2017   Chronic venous insufficiency 12/08/2016   Erythema nodosum 11/10/2016   Paronychia of finger, left 04/13/2016   Left carotid artery stenosis 09/24/2014   Foot pain, left 03/18/2013   Gastrocnemius strain 03/18/2013   GERD (gastroesophageal reflux disease) 08/18/2011   History of pulmonary  embolism 10/2008   DUODENITIS, WITHOUT HEMORRHAGE 06/29/2008   Diverticulosis of colon without hemorrhage 06/29/2008   DM type 2, controlled, with complication ([place]) 00/34/9611   Low back pain 10/15/2007   OBESITY, MORBID 07/16/2007   Grieving 05/15/2007   HLD (hyperlipidemia) 03/03/2007   Hypothyroidism 11/06/2006   Anxiety disorder 11/06/2006   CARPAL TUNNEL SYNDROME 11/06/2006   Essential hypertension 11/06/2006   HIATAL HERNIA 11/06/2006   Osteoarthritis 11/06/2006   PCP:  [name], MD Pharmacy:   [facility] [phone] - [place], [address] AT [address] [place] [address] Phone: [phone] Fax: [phone]  [place], [address] [place] [address] Phone: [phone] Fax: [phone]     Social Determinants of Health (SDOH) Social History: [place]: No Food Insecurity (03/30/2022)  Housing: Low Risk  (03/30/2022)  Transportation Needs: No Transportation Needs (03/30/2022)  Utilities: Not At Risk (03/30/2022)  Alcohol Screen: Low Risk  (07/01/2021)  Depression (PHQ2-9): Low Risk  (07/01/2021)  Financial Resource Strain: Low Risk  (07/01/2021)  Physical Activity: Inactive (07/01/2021)  Social Connections: Socially Isolated (07/01/2021)  Stress: No Stress Concern Present (07/01/2021)  Tobacco Use: Low Risk  (03/28/2022)   SDOH Interventions:     Readmission Risk Interventions     No data to display

## 2022-03-31 NOTE — Care Management Important Message (Signed)
Important Message  Patient Details  Name: Jessica [name] MRN: [id number] Date of Birth: 06/02/1933   Medicare Important Message Given:  Yes     Shelda Altes 03/31/2022, 9:52 AM

## 2022-03-31 NOTE — NC FL2 (Addendum)
[place] LEVEL OF CARE FORM     IDENTIFICATION  Patient Name: Jessica [name] Birthdate: 1933/06/11 Sex: female Admission Date (Current Location): 03/28/2022  [facility] and [place] Number:  Herbalist and Address:  [facility] [place]. [facility], [place] [address], [place], [place] [address]      Provider Number: [id number]  Attending Physician Name and Address:  Terrilee Croak, MD  Relative Name and Phone Number:  Dutko,Sharon Niece   [phone]    Current Level of Care: Hospital Recommended Level of Care: [place] Prior Approval Number:    Date Approved/Denied:   PASRR Number: [id number]  Discharge Plan: SNF    Current Diagnoses: Patient Active Problem List   Diagnosis Date Noted   Sepsis ([place]) 03/28/2022   Altered mental status 03/28/2022   Hypovolemia 03/28/2022   AKI (acute kidney injury) ([place]) 03/28/2022   Severe sepsis with acute organ dysfunction ([place]) 03/28/2022   DNR (do not resuscitate) 03/28/2022   Hypotension 03/28/2022   Rectal bleeding    Pure hypercholesterolemia 01/28/2021   Fall 12/22/2020   Knee contusion 12/22/2020   Eczema 09/09/2019   Vertigo 03/05/2019   S/P TAVR (transcatheter aortic valve replacement) 01/14/2019   Presence of permanent cardiac pacemaker    Pulmonary nodule    Severe aortic stenosis    Atrial fibrillation and flutter ([place]) 07/05/2017   Chronic diastolic heart failure (HCC)    DOE (dyspnea on exertion) 04/18/2017   Chronic venous insufficiency 12/08/2016   Erythema nodosum 11/10/2016   Paronychia of finger, left 04/13/2016   Left carotid artery stenosis 09/24/2014   Foot pain, left 03/18/2013   Gastrocnemius strain 03/18/2013   GERD (gastroesophageal reflux disease) 08/18/2011   History of pulmonary embolism 10/2008   DUODENITIS, WITHOUT HEMORRHAGE 06/29/2008   Diverticulosis of colon without hemorrhage 06/29/2008   DM type 2, controlled, with complication ([place])  92/33/0076   Low back pain 10/15/2007   OBESITY, MORBID 07/16/2007   Grieving 05/15/2007   HLD (hyperlipidemia) 03/03/2007   Hypothyroidism 11/06/2006   Anxiety disorder 11/06/2006   CARPAL TUNNEL SYNDROME 11/06/2006   Essential hypertension 11/06/2006   HIATAL HERNIA 11/06/2006   Osteoarthritis 11/06/2006    Orientation RESPIRATION BLADDER Height & Weight     Self, Time, Situation, Place  O2 (2L [place]) External catheter, Incontinent Weight: 272 lb 4.3 oz (123.5 kg) Height:  '5\' 2"'$  (157.5 cm)  BEHAVIORAL SYMPTOMS/MOOD NEUROLOGICAL BOWEL NUTRITION STATUS      Continent Diet (see d/c summary)  AMBULATORY STATUS COMMUNICATION OF NEEDS Skin   Extensive Assist Verbally Normal                       Personal Care Assistance Level of Assistance  Bathing, Feeding, Dressing Bathing Assistance: Maximum assistance Feeding assistance: Independent Dressing Assistance: Maximum assistance     Functional Limitations Info  Sight, Hearing, Speech Sight Info: Impaired Hearing Info: Adequate Speech Info: Adequate    SPECIAL CARE FACTORS FREQUENCY  PT (By licensed PT), OT (By licensed OT)     PT Frequency: 5x/week OT Frequency: 5x/week            Contractures Contractures Info: Not present    Additional Factors Info  Code Status, Allergies Code Status Info: DNR Allergies Info: Coreg (carvedilol), calcium, codeine, Relafen (nabumetone), Lipitor (atorvastatin), Pacerone (amiodarone), Vasotec (enalapril), Vioxx (rofecoxib), Zocor (simvastatin)           Current Medications (03/31/2022):  This is the current hospital active  medication list Current Facility-Administered Medications  Medication Dose Route Frequency Provider Last Rate Last Admin   0.9 %  sodium chloride infusion  250 mL Intravenous Continuous Lestine Mount, PA-C   Held at 03/28/22 2304   acetaminophen (TYLENOL) tablet 650 mg  650 mg Oral Q6H PRN Karmen Bongo, MD   650 mg at 03/31/22 1051   Or    acetaminophen (TYLENOL) suppository 650 mg  650 mg Rectal Q6H PRN Karmen Bongo, MD       ceFAZolin (ANCEF) IVPB 2g/100 mL premix  2 g Intravenous Q8H Bell, Lorin C, RPH 200 mL/hr at 03/31/22 0812 2 g at 03/31/22 2244   Chlorhexidine Gluconate Cloth 2 % PADS 6 each  6 each Topical Daily Juanito Doom, MD   6 each at 03/31/22 0813   docusate sodium (COLACE) capsule 100 mg  100 mg Oral BID PRN [place] Crane M, PA-C       enoxaparin (LOVENOX) injection 120 mg  120 mg Subcutaneous Q12H Einar Grad, RPH   120 mg at 03/31/22 1610   insulin aspart (novoLOG) injection 0-5 Units  0-5 Units Subcutaneous QHS Dahal, Binaya, MD       insulin aspart (novoLOG) injection 0-9 Units  0-9 Units Subcutaneous TID WC Dahal, Marlowe Aschoff, MD   2 Units at 03/31/22 1603   insulin glargine-yfgn (SEMGLEE) injection 12 Units  12 Units Subcutaneous QHS Dahal, Marlowe Aschoff, MD       levothyroxine (SYNTHROID) tablet 75 mcg  75 mcg Oral Q0600 Karmen Bongo, MD   75 mcg at 03/31/22 0455   metoprolol tartrate (LOPRESSOR) tablet 25 mg  25 mg Oral BID Dahal, Marlowe Aschoff, MD   25 mg at 03/31/22 1511   ondansetron (ZOFRAN) tablet 4 mg  4 mg Oral Q6H PRN Karmen Bongo, MD       Or   ondansetron [facility]) injection 4 mg  4 mg Intravenous Q6H PRN Karmen Bongo, MD   4 mg at 03/30/22 0417   Oral care mouth rinse  15 mL Mouth Rinse 4 times per day Simonne Maffucci B, MD   15 mL at 03/31/22 1155   Oral care mouth rinse  15 mL Mouth Rinse PRN Juanito Doom, MD       phosphorus (K PHOS NEUTRAL) tablet 500 mg  500 mg Oral BID Dahal, Marlowe Aschoff, MD   500 mg at 03/31/22 1510   polyethylene glycol (MIRALAX / GLYCOLAX) packet 17 g  17 g Oral Daily PRN [place] Crane M, PA-C       sodium chloride flush (NS) 0.9 % injection 3 mL  3 mL Intravenous Lillia [place], MD   3 mL at 03/31/22 0810   venlafaxine XR (EFFEXOR-XR) 24 hr capsule 75 mg  75 mg Oral Q breakfast Karmen Bongo, MD   75 mg at 03/31/22 9753     Discharge  Medications: Please see discharge summary for a list of discharge medications.  Relevant Imaging Results:  Relevant Lab Results:   Additional Information SSN [place], [place]

## 2022-03-31 NOTE — Progress Notes (Signed)
Subjective: The patient is alert and pleasant.  She has no complaints.  Objective: Vital signs in last 24 hours: Temp:  [97.7 F (36.5 C)-99.8 F (37.7 C)] 97.7 F (36.5 C) (12/29 0734) Pulse Rate:  [115-118] 118 (12/29 0734) Resp:  [15-30] 20 (12/29 0734) BP: (106-161)/(58-140) 126/77 (12/29 0734) SpO2:  [92 %-97 %] 96 % (12/29 0734) Weight:  [122.9 kg-123.5 kg] 123.5 kg (12/29 0432) Estimated body mass index is 49.8 kg/m as calculated from the following:   Height as of this encounter: '5\' 2"'$  (1.575 m).   Weight as of this encounter: 123.5 kg.   Intake/Output from previous day: 12/28 0701 - 12/29 0700 In: 17 [P.O.:240; IV Piggyback:172] Out: 700 [Urine:700] Intake/Output this shift: No intake/output data recorded.  Physical exam the patient is alert and oriented.  She is moving all 4 extremities well.  Her speech is normal.  I reviewed the patient's CT performed yesterday.  I do not see any evidence of empyema. Lab Results: Recent Labs    03/29/22 0522 03/30/22 0347  WBC 16.2* 11.1*  HGB 10.2* 10.6*  HCT 31.0* 32.1*  PLT 100* 93*   BMET Recent Labs    03/29/22 0522 03/30/22 0347  NA 127* 136  K 3.4* 3.4*  CL 98 103  CO2 18* 24  GLUCOSE 473* 184*  BUN 18 13  CREATININE 1.10* 0.86  CALCIUM 7.4* 8.0*    Studies/Results: CT HEAD WO CONTRAST (5MM)  Result Date: 03/30/2022 CLINICAL DATA:  Stroke follow-up EXAM: CT HEAD WITHOUT CONTRAST TECHNIQUE: Contiguous axial images were obtained from the base of the skull through the vertex without intravenous contrast. RADIATION DOSE REDUCTION: This exam was performed according to the departmental dose-optimization program which includes automated exposure control, adjustment of the mA and/or kV according to patient size and/or use of iterative reconstruction technique. COMPARISON:  None Available. FINDINGS: Brain: No acute intracranial hemorrhage. No focal mass lesion. No CT evidence of acute infarction. No midline shift  or mass effect. No hydrocephalus. Basilar cisterns are patent. There are periventricular and subcortical white matter hypodensities. Generalized cortical atrophy. Prominent frontal atrophy. Vascular: No hyperdense vessel or unexpected calcification. Skull: Normal. Negative for fracture or focal lesion. Sinuses/Orbits: Paranasal sinuses and mastoid air cells are clear. Orbits are clear. Other: None. IMPRESSION: 1. No interval change from CT 03/08/2022. 2. No acute cortical infarction Electronically Signed   By: Suzy Bouchard M.D.   On: 03/30/2022 11:54   DG Chest Port 1 View  Result Date: 03/30/2022 CLINICAL DATA:  7035 with acute respiratory failure. 10027 with tachypnea. EXAM: PORTABLE CHEST 1 VIEW COMPARISON:  Portable chest 03/28/2022, chest CT 07/09/2019. FINDINGS: 3:28 a.m. A left chest dual lead pacing system and wire insertions are unaltered. Large hiatal hernia again noted, and old TAVR. There is aortic atherosclerosis and tortuosity with stable mediastinum. There is mild cardiomegaly. Perihilar vascular prominence is noted without overt edema. There is worsening patchy consolidation in the lower lung fields most likely due to pneumonia or aspiration. There are small pleural effusions. The mid to upper lung fields remain clear. Overall aeration is otherwise unchanged. IMPRESSION: 1. Worsening patchy consolidation in the lower lung fields most likely due to pneumonia or aspiration. Small pleural effusions. 2. Perihilar vascular prominence without overt edema. 3. Large hiatal hernia. Electronically Signed   By: Telford Nab M.D.   On: 03/30/2022 04:23   ECHOCARDIOGRAM COMPLETE  Result Date: 03/29/2022    ECHOCARDIOGRAM REPORT   Patient Name:   Jessica [name] Date of  Exam: 03/29/2022 Medical Rec #:  [id number]          Height:       65.0 in Accession #:    [id number]         Weight:       240.0 lb Date of Birth:  [birth year]          BSA:          2.138 m Patient Age:    88 years           BP:            119/46 mmHg Patient Gender: F                  HR:           82 bpm. Exam Location:  Inpatient Procedure: 2D Echo, Cardiac Doppler, Color Doppler and Intracardiac            Opacification Agent Indications:    Congestive Heart Failure I50.9  History:        Patient has prior history of Echocardiogram examinations, most                 recent 01/07/2020. Pacemaker, Arrythmias:Atrial Flutter and                 Atrial Fibrillation; Risk Factors:Diabetes, Hypertension,                 Dyslipidemia and GERD.                 Aortic Valve: 26 mm Sapien prosthetic, stented (TAVR) valve is                 present in the aortic position. Procedure Date: 01/14/19.  Sonographer:    Bernadene Person RDCS Referring Phys: [id number] Lowella Dell REESE  Sonographer Comments: Patient is obese. IMPRESSIONS  1. The aortic valve has been repaired/replaced. There is a 26 mm Sapien prosthetic (TAVR) valve present in the aortic position. Procedure Date: 01/14/19. Aortic valve mean gradient measures 15.0 mmHg. Aortic valve Vmax measures 2.61 m/s. EOA 1.9cm2, DI 0.5. There is mild paravalvular leak located at the 2 o'clock position on PSAX view.  2. Left ventricular ejection fraction, by estimation, is 55 to 60%. The left ventricle has normal function. Septal motion is paradoxical in the setting of RV pacing. There is moderate asymmetric left ventricular hypertrophy of the basal-septal segment. Left ventricular diastolic parameters are consistent with Grade II diastolic dysfunction (pseudonormalization).  3. Right ventricular systolic function is normal. The right ventricular size is normal. There is normal pulmonary artery systolic pressure. The estimated right ventricular systolic pressure is 66.0 mmHg.  4. Left atrial size was mildly dilated.  5. The mitral valve is degenerative. Trivial mitral valve regurgitation. Moderate mitral annular calcification.  6. Aortic dilatation noted. There is borderline dilatation of the ascending aorta,  measuring 37 mm.  7. The inferior vena cava is dilated in size with >50% respiratory variability, suggesting right atrial pressure of 8 mmHg. Comparison(s): Compared to prior TTE in 2021, the mean aortic gradient is 15 from 50mHg but EOA similar at ~2cm2. FINDINGS  Left Ventricle: Left ventricular ejection fraction, by estimation, is 55 to 60%. The left ventricle has normal function. The left ventricle has no regional wall motion abnormalities. Definity contrast agent was given IV to delineate the left ventricular  endocardial borders. The left ventricular internal cavity size was normal in size. There is moderate asymmetric left ventricular hypertrophy  of the basal-septal segment. Abnormal (paradoxical) septal motion, consistent with RV pacemaker. Left ventricular diastolic parameters are consistent with Grade II diastolic dysfunction (pseudonormalization). Right Ventricle: The right ventricular size is normal. Right vetricular wall thickness was not well visualized. Right ventricular systolic function is normal. There is normal pulmonary artery systolic pressure. The tricuspid regurgitant velocity is 2.61 m/s, and with an assumed right atrial pressure of 8 mmHg, the estimated right ventricular systolic pressure is 35.0 mmHg. Left Atrium: Left atrial size was mildly dilated. Right Atrium: Right atrial size was normal in size. Pericardium: There is no evidence of pericardial effusion. Mitral Valve: The mitral valve is degenerative in appearance. There is mild thickening of the mitral valve leaflet(s). There is mild calcification of the mitral valve leaflet(s). Moderate mitral annular calcification. Trivial mitral valve regurgitation. Tricuspid Valve: The tricuspid valve is normal in structure. Tricuspid valve regurgitation is mild. Aortic Valve: Mild paravalvular leak located at the 2o'clock position on PSAX view. DI 0.5. The aortic valve has been repaired/replaced. Aortic valve mean gradient measures 15.0 mmHg.  Aortic valve peak gradient measures 27.2 mmHg. EOA 1.9cm2. There is a 26 mm Sapien prosthetic, stented (TAVR) valve present in the aortic position. Procedure Date: 01/14/19. Pulmonic Valve: The pulmonic valve was normal in structure. Pulmonic valve regurgitation is trivial. Aorta: Aortic dilatation noted. There is borderline dilatation of the ascending aorta, measuring 37 mm. Venous: The inferior vena cava is dilated in size with greater than 50% respiratory variability, suggesting right atrial pressure of 8 mmHg. IAS/Shunts: The atrial septum is grossly normal. Additional Comments: A device lead is visualized.  LEFT VENTRICLE PLAX 2D LVIDd:         5.20 cm     Diastology LVIDs:         3.00 cm     LV e' medial:    4.56 cm/s LV PW:         0.80 cm     LV E/e' medial:  34.2 LV IVS:        0.80 cm     LV e' lateral:   4.56 cm/s LVOT diam:     2.60 cm     LV E/e' lateral: 34.2 LV SV:         127 LV SV Index:   60 LVOT Area:     5.31 cm  LV Volumes (MOD) LV vol d, MOD A2C: 75.8 ml LV vol d, MOD A4C: 72.9 ml LV vol s, MOD A2C: 34.8 ml LV vol s, MOD A4C: 32.8 ml LV SV MOD A2C:     41.0 ml LV SV MOD A4C:     72.9 ml LV SV MOD BP:      41.9 ml RIGHT VENTRICLE RV S prime:     14.90 cm/s TAPSE (M-mode): 1.8 cm LEFT ATRIUM             Index        RIGHT ATRIUM           Index LA diam:        5.00 cm 2.34 cm/m   RA Area:     16.90 cm LA Vol (A2C):   72.9 ml 34.11 ml/m  RA Volume:   41.40 ml  19.37 ml/m LA Vol (A4C):   81.4 ml 38.08 ml/m LA Biplane Vol: 77.1 ml 36.07 ml/m  AORTIC VALVE AV Area (Vmax):    2.56 cm AV Area (Vmean):   2.46 cm AV Area (VTI):     2.68 cm  AV Vmax:           261.00 cm/s AV Vmean:          185.667 cm/s AV VTI:            0.475 m AV Peak Grad:      27.2 mmHg AV Mean Grad:      15.0 mmHg LVOT Vmax:         126.00 cm/s LVOT Vmean:        85.900 cm/s LVOT VTI:          0.240 m LVOT/AV VTI ratio: 0.50  AORTA Ao Root diam: 3.30 cm Ao Asc diam:  3.70 cm MITRAL VALVE                TRICUSPID VALVE MV  Area (PHT): 3.91 cm     TR Peak grad:   27.2 mmHg MV Decel Time: 194 msec     TR Vmax:        261.00 cm/s MV E velocity: 156.00 cm/s MV A velocity: 84.60 cm/s   SHUNTS MV E/A ratio:  1.84         Systemic VTI:  0.24 m                             Systemic Diam: 2.60 cm Gwyndolyn Kaufman MD Electronically signed by Gwyndolyn Kaufman MD Signature Date/Time: 03/29/2022/11:52:14 AM    Final     Assessment/Plan: Sepsis: I will sign off.  Please call if I can be of further assistance.  LOS: 3 days     Jessica [name] 03/31/2022, 7:42 AM     Patient ID: Jessica [name], female   DOB: 10-Nov-1933, 88 y.o.   MRN: [id number]

## 2022-03-31 NOTE — Progress Notes (Signed)
[place] for enoxaparin Indication: atrial fibrillation  Allergies  Allergen Reactions   Coreg [Carvedilol] Other (See Comments)    Weak legs   Relafen [Nabumetone] Other (See Comments)    Upset stomach   Calcium Other (See Comments)    Unknown reaction   Codeine Other (See Comments)    Unknown reaction   Lipitor [Atorvastatin] Other (See Comments)    Myalgias   Pacerone [Amiodarone] Other (See Comments)    Hand tremors   Vasotec [Enalapril] Cough   Vioxx [Rofecoxib] Other (See Comments)    Unknown reaction   Zocor [Simvastatin] Other (See Comments)    Myalgias    Patient Measurements: Height: '5\' 2"'$  (157.5 cm) Weight: 123.5 kg (272 lb 4.3 oz) IBW/kg (Calculated) : 50.1  Vital Signs: Temp: 97.7 F (36.5 C) (12/29 0734) Temp Source: Oral (12/29 0734) BP: 126/77 (12/29 0734) Pulse Rate: 118 (12/29 0734)  Labs: Recent Labs    03/29/22 0522 03/30/22 0347 03/30/22 0550 03/31/22 1053  HGB 10.2* 10.6*  --  10.4*  HCT 31.0* 32.1*  --  30.9*  PLT 100* 93*  --  PLATELET CLUMPS NOTED ON SMEAR, COUNT APPEARS DECREASED  LABPROT 20.0*  --   --   --   INR 1.7*  --   --   --   CREATININE 1.10* 0.86  --  0.74  TROPONINIHS  --  149* 130*  --     Estimated Creatinine Clearance: 63.4 mL/min (by C-G formula based on SCr of 0.74 mg/dL).   Medical History: Past Medical History:  Diagnosis Date   Anxiety    Atrial fibrillation and flutter ([place])    detected by PPM interrogation (mostly atrial flutter)   AV block, 2nd degree 10/2012   s/p MDT ADDRL1 pacemaker implantation 10/10/2012 by [name]   Chronic diastolic heart failure ([place])    Depression    GERD (gastroesophageal reflux disease)    HH (hiatus hernia)    History of pulmonary embolism 10/2008   Bilateral   Hyperlipidemia    Hypertension    Hypothyroidism    Obesity    Osteoarthritis    Peripheral neuropathy    Right leg   Presence of permanent cardiac pacemaker     Pulmonary nodule    in the lingula. Noted on pre TAVR CT. Will require follow up   Renal insufficiency 2010   S/P TAVR (transcatheter aortic valve replacement)    Shingles 09/17/2014   right lower quadrant   Type II or unspecified type diabetes mellitus without mention of complication, not stated as uncontrolled 2009     Assessment: 29 yoF admitted with AMS and MSSA bacteremia. Pt has hx AFib on apixaban PTA which was held pending repeat head imaging. Pharmacy asked to resume anticoagulation with enoxaparin in case procedures needed in the future. H/H ok, Cr normal.  Goal of Therapy:  Anti-Xa level 0.6-1 units/ml 4hrs after LMWH dose given Monitor platelets by anticoagulation protocol: Yes   Plan:  Enoxaparin '120mg'$  (~'1mg'$ /kg) SQ q12h Follow CBC q72h  Arrie Senate, PharmD, BCPS, [facility] Clinical Pharmacist [phone] Please check AMION for all [place] numbers 03/31/2022

## 2022-03-31 NOTE — Progress Notes (Signed)
Mobility Specialist Progress Note   03/31/22 1612  Mobility  Activity Dangled on edge of bed (LE exericse)  Level of Assistance +2 (takes two people) (max)  Assistive Device  (HHA)  Range of Motion/Exercises Active;All extremities  Activity Response Tolerated well   Patient received in supine and agreeable to participate. Required max A + 2 for bed mobility from supine to sit. Reviewed and completed LE exercises. Tolerated without complaint or incident. Was left dangling with all needs met, call bell in reach.   [place] Eryk Beavers, BS EXP Mobility Specialist Please contact via SecureChat or Rehab office at [phone]

## 2022-03-31 NOTE — Progress Notes (Signed)
Cardiology paged for TEE for bacteremia with question of PPM lead extraction. Due to scheduling, pt is scheduled for TEE 04/05/21 at 11:30. Based on TEE results, cardiology may be consulted for PPM lead extraction.   Can make NPO at [facility] Monday night just in case of schedule changes on Tues.

## 2022-03-31 NOTE — Consult Note (Signed)
   [facility] Inpatient Consult   03/31/2022  RYLLIE NIELAND Nov 08, 1933 [id number]  [facility] [ACO] Patient: [employer]   Primary Care Provider:  Plotnikov, Evie Lacks, MD, [place] [facility] is listed to provide the transition of care follow up  Patient screened for hospitalization with noted to assess for potential [place] Management service needs for post hospital transition for care coordination.  Review of patient's electronic medical record reveals patient is from home with minimal support. Noted her last Hemoglobin A1C was 10.0. 11:35 am met with patient at the bedside.  Her niece was at the bedside but had to leave.  Patient states of feeling very sick and she can't remember events of Tuesday clearly.  She has asked for a chaplain as she is tearful speaking with this Probation officer about self care.  Explained the role of [place] when returning to community or at a [facility] affiliated facility for support to return to community, if possible Patient discussed in unit progression morning meeting with inpatient [facility] team. Endorses PCP and given an appointment reminder card.  Plan:  Continue to follow progress and disposition to assess for post hospital community care coordination/management needs.  Referral request for community care coordination:  depending on disposition.  Of note, [facility] Care Management/Population Health does not replace or interfere with any arrangements made by the Inpatient Transition of Care team.  For questions contact:   Natividad Brood, RN BSN [place]  [phone] business mobile phone Toll free office [phone]  *[place]  [phone] Fax number: [phone] [email or web address]

## 2022-03-31 NOTE — Plan of Care (Signed)

## 2022-04-01 DIAGNOSIS — A419 Sepsis, unspecified organism: Secondary | ICD-10-CM | POA: Diagnosis not present

## 2022-04-01 DIAGNOSIS — R6520 Severe sepsis without septic shock: Secondary | ICD-10-CM | POA: Diagnosis not present

## 2022-04-01 LAB — GLUCOSE, CAPILLARY
Glucose-Capillary: 163 mg/dL — ABNORMAL HIGH (ref 70–99)
Glucose-Capillary: 207 mg/dL — ABNORMAL HIGH (ref 70–99)
Glucose-Capillary: 210 mg/dL — ABNORMAL HIGH (ref 70–99)
Glucose-Capillary: 201 mg/dL — ABNORMAL HIGH (ref 70–99)

## 2022-04-01 MED ORDER — INSULIN GLARGINE-YFGN 100 UNIT/ML ~~LOC~~ SOLN
15.0000 [IU] | Freq: Every day | SUBCUTANEOUS | Status: DC
  Administered 2022-04-01: 15 [IU] via SUBCUTANEOUS
  Filled 2022-04-01 (×2): qty 0.15

## 2022-04-01 MED ORDER — LORAZEPAM 0.5 MG PO TABS
0.5000 mg | ORAL_TABLET | Freq: Two times a day (BID) | ORAL | Status: DC | PRN
  Administered 2022-04-01 – 2022-04-09 (×10): 0.5 mg via ORAL
  Filled 2022-04-01 (×11): qty 1

## 2022-04-01 MED ORDER — METOPROLOL TARTRATE 25 MG PO TABS
37.5000 mg | ORAL_TABLET | Freq: Two times a day (BID) | ORAL | Status: DC
  Administered 2022-04-01 – 2022-04-04 (×6): 37.5 mg via ORAL
  Filled 2022-04-01 (×6): qty 1

## 2022-04-01 NOTE — Progress Notes (Addendum)
PROGRESS NOTE  Jessica [name]  DOB: [birth year]  PCP: [name], MD DXA:[id number]  DOA: 03/28/2022  LOS: 4 days  Hospital Day: 5  Brief narrative: Jessica [name] is a 88 y.o. female with PMH significant for morbid obesity, DM2, HTN, HLD, A-fib/flutter on Eliquis, second-degree AV block, s/p PPM, chronic diastolic CHF, AS s/p TAVR, history of PE, GERD, hypothyroidism, osteoarthritis, anxiety/depression. 12/26, patient was brought to the ED by EMS from home for altered mental status. Per report, she had not been feeling well for few days, had decreased oral intake, nausea, vomiting and dysuria and essentially bedridden for the last few days.  In the ED, patient had a fever of 101.7, blood pressure low in 90s.  Labs showed WBC count at 13.3, lactic acid elevated to 4.7 CT abdomen pelvis was negative for any acute findings, showed possible atelectasis versus bilateral pneumonia CT head and CTA head and neck were obtained which showed questionable low-density subdural collection over the left cerebral hemisphere along with mild chronic small vessel ischemic changes. EDP discussed with neurosurgery, suspected the imaging findings were due to chronic SDH.  Recommended holding Eliquis and repeating CT head in 48 hours.  Initially admitted to [facility] Because of low blood pressure, she was given IV hydration but was limited due to pulmonary edema and new oxygen requirement.  Blood pressure remained low and hence PCCM was consulted. She was started on Levophed and upgraded to ICU. Blood culture sent on admission grew MSSA ID was consulted.  12/29, transferred out to [facility]  Subjective: Patient was seen and examined this morning.   Sitting up in recliner.  Not in distress.   Remains slightly tachycardic.  Feels better.  Family not at bedside.  Assessment and plan: Septic shock -POA MSSA bacteremia Presented with fever, lethargy, leukocytosis, lactic acid elevation Blood culture  sent on admission grew MSSA.  Source unclear at this time ID following.  Currently on IV Ancef ID recommended TEE for evaluation of TAVR and PPM device.  Also recommended consideration of PPM extraction.  12/21, I called cardiology and requested for TEE.  On schedule for Tuesday or Wednesday. WBC count, lactic acid level improved. Recent Labs  Lab 03/28/22 1041 03/28/22 1123 03/28/22 1241 03/28/22 1732 03/28/22 1947 03/29/22 0522 03/30/22 0347 03/31/22 1053  WBC  --  13.3*  --   --   --  16.2* 11.1* 8.0  LATICACIDVEN 4.7*  --  3.7*  --  2.9*  --   --  1.2  PROCALCITON  --   --   --  4.41  --  3.38 1.93  --    HypOtension History of chronic diastolic CHF Initially blood pressure low because of shock.  Required pressors.  Blood pressure improved PTA on Toprol 50 mg daily, Lasix 40 mg daily, Entresto 97/103 mg twice daily. 12/21, I resumed Lopressor at 25 mg twice daily.  Patient still remains tachycardic, blood pressure is normal however.  I will increase Lopressor to 37.5 mg daily today.  Continue to hold Lasix and Entresto  A-fib/flutter second-degree AV block s/p PPM Continue to monitor in telemetry Metoprolol resumed Eliquis currently remains on hold.  Currently on full dose Lovenox while awaiting for TEE  Uncontrolled type 2 diabetes mellitus with hyperglycemia A1c 10 on 12/26/2021 PTA on Prandin 4 mg Premeal 3 times daily, metformin 500 mg twice daily Currently on Lantus 12 units nightly and sliding scale insulin with Accu-Cheks only.  Increase Lantus to 15 units for tonight. Recent  Labs  Lab 03/31/22 1515 03/31/22 1619 03/31/22 2106 04/01/22 0618 04/01/22 1107  GLUCAP 176* 161* 238* 163* 376*   AKI Metabolic acidosis Creatinine was elevated to 1.3 on admission.  Improved with hydration.  Serum bicarb level improved as well. Recent Labs    06/22/21 1214 09/22/21 1151 12/26/21 1218 03/28/22 1123 03/29/22 0522 03/30/22 0347 03/31/22 1053  BUN 19 21 24* '17 18  13 9  '$ CREATININE 0.90 0.90 0.94 1.31* 1.10* 0.86 0.74  CO2 '27 26 25 '$ 20* 18* 24 25   Hyponatremia Sodium level improved as below. Recent Labs  Lab 03/28/22 1058 03/28/22 1123 03/29/22 0522 03/30/22 0347 03/31/22 1053  NA 133* 133* 127* 136 136   Hypokalemia/hypophosphatemia Potassium level improved.  Phosphorus level low at 1.5 on 12/21.  Replacement ordered. Recent Labs  Lab 03/28/22 1058 03/28/22 1123 03/29/22 0522 03/30/22 0347 03/30/22 1927 03/31/22 1053  K 3.9 3.9 3.4* 3.4*  --  3.9  MG  --   --  2.0 2.2  --  2.2  PHOS  --   --  2.2* 1.5* 1.2* 1.5*   Acute metabolic encephalopathy Secondary to septic shock. Improved.  Presumed chronic SDH Initial CT head finding as above showing subdural collection. Repeat CT head 12/28 without changes.  No need of intervention per neurosurgery.  Hyperlipidemia PTA on Zetia 10 mg daily  Hypothyroidism Synthroid 75 mg daily  Anxiety/depression PTA on Effexor, Restoril, Ativan as needed.   Goals of care   Code Status: DNR    Mobility: PT eval obtained.  SNF recommended  Scheduled Meds:  Chlorhexidine Gluconate Cloth  6 each Topical Daily   enoxaparin (LOVENOX) injection  120 mg Subcutaneous Q12H   insulin aspart  0-5 Units Subcutaneous QHS   insulin aspart  0-9 Units Subcutaneous TID WC   insulin glargine-yfgn  15 Units Subcutaneous QHS   levothyroxine  75 mcg Oral Q0600   metoprolol tartrate  37.5 mg Oral BID   mouth rinse  15 mL Mouth Rinse 4 times per day   sodium chloride flush  3 mL Intravenous Q12H   venlafaxine XR  75 mg Oral Q breakfast    PRN meds: acetaminophen **OR** acetaminophen, docusate sodium, ondansetron **OR** ondansetron (ZOFRAN) IV, mouth rinse, polyethylene glycol   Infusions:   sodium chloride Stopped (03/28/22 2304)    ceFAZolin (ANCEF) IV 2 g (04/01/22 0936)    Skin assessment:     Nutritional status:  Body mass index is 49.8 kg/m.          Diet:  Diet Order              Diet Carb Modified Fluid consistency: Thin; Room service appropriate? Yes  Diet effective ____                   DVT prophylaxis:  Place and maintain sequential compression device Start: 03/29/22 0749 SCDs Start: 03/28/22 1953   Antimicrobials: Ancef IV Fluid: None Consultants: Cardiology, ID Family Communication: None at bedside  Status is: Inpatient  Continue in-hospital care because: Ongoing workup.  Pending TEE Level of care: Progressive   Dispo: The patient is from: Home              Anticipated d/c is to: Pending clinical course              Patient currently is not medically stable to d/c.   Difficult to place patient No    Antimicrobials: Anti-infectives (From admission, onward)    Start  Dose/Rate Route Frequency Ordered Stop   03/30/22 1300  vancomycin (VANCOREADY) IVPB 1500 mg/300 mL  Status:  Discontinued        1,500 mg 150 mL/hr over 120 Minutes Intravenous Every 48 hours 03/28/22 1315 03/29/22 0735   03/29/22 1000  ceFAZolin (ANCEF) IVPB 2g/100 mL premix        2 g 200 mL/hr over 30 Minutes Intravenous Every 8 hours 03/29/22 0742     03/29/22 0000  metroNIDAZOLE (FLAGYL) IVPB 500 mg  Status:  Discontinued        500 mg 100 mL/hr over 60 Minutes Intravenous Every 12 hours 03/28/22 1837 03/29/22 0735   03/28/22 2200  ceFEPIme (MAXIPIME) 2 g in sodium chloride 0.9 % 100 mL IVPB  Status:  Discontinued        2 g 200 mL/hr over 30 Minutes Intravenous Every 12 hours 03/28/22 1851 03/29/22 0735   03/28/22 1430  metroNIDAZOLE (FLAGYL) IVPB 500 mg        500 mg 100 mL/hr over 60 Minutes Intravenous  Once 03/28/22 1427 03/28/22 1655   03/28/22 1130  vancomycin (VANCOREADY) IVPB 2000 mg/400 mL        2,000 mg 200 mL/hr over 120 Minutes Intravenous  Once 03/28/22 1129 03/28/22 1457   03/28/22 1045  ceFEPIme (MAXIPIME) 2 g in sodium chloride 0.9 % 100 mL IVPB        2 g 200 mL/hr over 30 Minutes Intravenous  Once 03/28/22 1042 03/28/22 1203        Objective: Vitals:   04/01/22 0700 04/01/22 1109  BP: 132/87 127/66  Pulse: (!) 117 (!) 116  Resp: 20 (!) 22  Temp: 97.7 F (36.5 C) 97.7 F (36.5 C)  SpO2: 98% 92%    Intake/Output Summary (Last 24 hours) at 04/01/2022 1421 Last data filed at 04/01/2022 1301 Gross per 24 hour  Intake 796.61 ml  Output 1000 ml  Net -203.39 ml   Filed Weights   03/30/22 0500 03/30/22 1348 03/31/22 0432  Weight: 116.2 kg 122.9 kg 123.5 kg   Weight change:  Body mass index is 49.8 kg/m.   Physical Exam: General exam: Pleasant, elderly, not in pain Skin: No rashes, lesions or ulcers. HEENT: Atraumatic, normocephalic, no obvious bleeding Lungs: Clear to auscultation bilaterally CVS: Regular rate and rhythm, no murmur GI/Abd soft, nontender, nondistended, bowel sound present CNS: Alert, awake, oriented to place and person Psychiatry: Mood appropriate Extremities: No pedal edema, no calf tenderness  Data Review: I have personally reviewed the laboratory data and studies available.  F/u labs ordered Unresulted Labs (From admission, onward)     Start     Ordered   04/02/22 2426  Basic metabolic panel  Tomorrow morning,   R       Question:  Specimen collection method  Answer:  Lab=Lab collect   04/01/22 0736   04/02/22 0500  CBC with Differential/Platelet  Tomorrow morning,   R       Question:  Specimen collection method  Answer:  Lab=Lab collect   04/01/22 0736            Total time spent in review of labs and imaging, patient evaluation, formulation of plan, documentation and communication with family 66 minutes  Signed, Terrilee Croak, MD [facility] 04/01/2022

## 2022-04-02 DIAGNOSIS — R6520 Severe sepsis without septic shock: Secondary | ICD-10-CM | POA: Diagnosis not present

## 2022-04-02 DIAGNOSIS — A419 Sepsis, unspecified organism: Secondary | ICD-10-CM | POA: Diagnosis not present

## 2022-04-02 LAB — BASIC METABOLIC PANEL
Anion gap: 9 (ref 5–15)
BUN: 12 mg/dL (ref 8–23)
CO2: 24 mmol/L (ref 22–32)
Calcium: 8.5 mg/dL — ABNORMAL LOW (ref 8.9–10.3)
Chloride: 102 mmol/L (ref 98–111)
Creatinine, Ser: 0.77 mg/dL (ref 0.44–1.00)
GFR, Estimated: >60 mL/min (ref >60)
Glucose, Bld: 168 mg/dL — ABNORMAL HIGH (ref 70–99)
Potassium: 4.6 mmol/L (ref 3.5–5.1)
Sodium: 135 mmol/L (ref 135–145)

## 2022-04-02 LAB — CBC WITH DIFFERENTIAL/PLATELET
Abs Immature Granulocytes: 0.21 10*3/uL — ABNORMAL HIGH (ref 0.00–0.07)
Basophils Absolute: 0 10*3/uL (ref 0.0–0.1)
Basophils Relative: 0 %
Eosinophils Absolute: 0 10*3/uL (ref 0.0–0.5)
Eosinophils Relative: 0 %
HCT: 39 % (ref 36.0–46.0)
Hemoglobin: 12.9 g/dL (ref 12.0–15.0)
Immature Granulocytes: 3 %
Lymphocytes Relative: 26 %
Lymphs Abs: 1.9 10*3/uL (ref 0.7–4.0)
MCH: 31 pg (ref 26.0–34.0)
MCHC: 33.1 g/dL (ref 30.0–36.0)
MCV: 93.8 fL (ref 80.0–100.0)
Monocytes Absolute: 1 10*3/uL (ref 0.1–1.0)
Monocytes Relative: 13 %
Neutro Abs: 4.2 10*3/uL (ref 1.7–7.7)
Neutrophils Relative %: 58 %
Platelets: 105 10*3/uL — ABNORMAL LOW (ref 150–400)
RBC: 4.16 MIL/uL (ref 3.87–5.11)
RDW: 13.9 % (ref 11.5–15.5)
WBC: 7.4 10*3/uL (ref 4.0–10.5)
nRBC: 0 % (ref 0.0–0.2)

## 2022-04-02 LAB — GLUCOSE, CAPILLARY
Glucose-Capillary: 164 mg/dL — ABNORMAL HIGH (ref 70–99)
Glucose-Capillary: 186 mg/dL — ABNORMAL HIGH (ref 70–99)
Glucose-Capillary: 138 mg/dL — ABNORMAL HIGH (ref 70–99)
Glucose-Capillary: 144 mg/dL — ABNORMAL HIGH (ref 70–99)

## 2022-04-02 LAB — CULTURE, BLOOD (ROUTINE X 2)
Culture: NO GROWTH
Special Requests: ADEQUATE

## 2022-04-02 MED ORDER — METFORMIN HCL 500 MG PO TABS
500.0000 mg | ORAL_TABLET | Freq: Two times a day (BID) | ORAL | Status: DC
  Administered 2022-04-02 – 2022-04-09 (×13): 500 mg via ORAL
  Filled 2022-04-02 (×14): qty 1

## 2022-04-02 MED ORDER — FUROSEMIDE 40 MG PO TABS
40.0000 mg | ORAL_TABLET | Freq: Every day | ORAL | Status: DC
  Administered 2022-04-02 – 2022-04-04 (×3): 40 mg via ORAL
  Filled 2022-04-02 (×3): qty 1

## 2022-04-02 MED ORDER — ROSUVASTATIN CALCIUM 5 MG PO TABS
10.0000 mg | ORAL_TABLET | ORAL | Status: DC
  Administered 2022-04-03 – 2022-04-07 (×3): 10 mg via ORAL
  Filled 2022-04-02 (×3): qty 2

## 2022-04-02 MED ORDER — INSULIN GLARGINE-YFGN 100 UNIT/ML ~~LOC~~ SOLN
5.0000 [IU] | Freq: Every day | SUBCUTANEOUS | Status: DC
  Administered 2022-04-02 – 2022-04-08 (×6): 5 [IU] via SUBCUTANEOUS
  Filled 2022-04-02 (×8): qty 0.05

## 2022-04-02 MED ORDER — SACUBITRIL-VALSARTAN 97-103 MG PO TABS
1.0000 | ORAL_TABLET | Freq: Two times a day (BID) | ORAL | Status: DC
  Administered 2022-04-02 – 2022-04-09 (×15): 1 via ORAL
  Filled 2022-04-02 (×15): qty 1

## 2022-04-02 MED ORDER — EZETIMIBE 10 MG PO TABS
10.0000 mg | ORAL_TABLET | Freq: Every day | ORAL | Status: DC
  Administered 2022-04-02 – 2022-04-09 (×8): 10 mg via ORAL
  Filled 2022-04-02 (×9): qty 1

## 2022-04-02 MED ORDER — REPAGLINIDE 1 MG PO TABS
4.0000 mg | ORAL_TABLET | Freq: Three times a day (TID) | ORAL | Status: DC
  Administered 2022-04-02 – 2022-04-09 (×23): 4 mg via ORAL
  Filled 2022-04-02 (×17): qty 4
  Filled 2022-04-02: qty 2
  Filled 2022-04-02: qty 4
  Filled 2022-04-02: qty 2
  Filled 2022-04-02 (×7): qty 4

## 2022-04-02 NOTE — Progress Notes (Signed)
Patient called out x2 to get out of bed to chair. 2 person assist to chair with walker. Patient has dyspnea with exertion. On 2L nasal cannula.

## 2022-04-02 NOTE — Progress Notes (Addendum)
PROGRESS NOTE  Jessica [name]  DOB: [birth year]  PCP: [name], MD VOJ:[id number]  DOA: 03/28/2022  LOS: 5 days  Hospital Day: 6  Brief narrative: Jessica [name] is a 88 y.o. female with PMH significant for morbid obesity, DM2, HTN, HLD, A-fib/flutter on Eliquis, second-degree AV block, s/p PPM, chronic diastolic CHF, AS s/p TAVR, history of PE, GERD, hypothyroidism, osteoarthritis, anxiety/depression. 12/26, patient was brought to the ED by EMS from home for altered mental status. Per report, she had not been feeling well for few days, had decreased oral intake, nausea, vomiting and dysuria and essentially bedridden for the last few days.  In the ED, patient had a fever of 101.7, blood pressure low in 90s.  Labs showed WBC count at 13.3, lactic acid elevated to 4.7 CT abdomen pelvis was negative for any acute findings, showed possible atelectasis versus bilateral pneumonia CT head and CTA head and neck were obtained which showed questionable low-density subdural collection over the left cerebral hemisphere along with mild chronic small vessel ischemic changes. EDP discussed with neurosurgery, suspected the imaging findings were due to chronic SDH.  Recommended holding Eliquis and repeating CT head in 48 hours.  Initially admitted to [facility] Because of low blood pressure, she was given IV hydration but was limited due to pulmonary edema and new oxygen requirement.  Blood pressure remained low and hence PCCM was consulted. She was started on Levophed and upgraded to ICU. Blood culture sent on admission grew MSSA ID was consulted.  12/29, transferred out to [facility]  Subjective: Patient was seen and examined this morning.   Sitting up in recliner.  Not in distress.   On low-flow oxygen.  Patient admits to soreness in the back of her left ear with nasal cannula.  Family not at bedside.  Assessment and plan: Septic shock -POA MSSA bacteremia Presented with fever, lethargy,  leukocytosis, lactic acid elevation Blood culture sent on admission grew MSSA.  Source unclear at this time ID following.  Currently on IV Ancef ID recommended TEE for evaluation of TAVR and PPM device.  Also recommended consideration of PPM extraction.  12/21, I called cardiology and requested for TEE.  On schedule for Tuesday or Wednesday. WBC count, lactic acid level improved. Recent Labs  Lab 03/28/22 1041 03/28/22 1123 03/28/22 1241 03/28/22 1732 03/28/22 1947 03/29/22 0522 03/30/22 0347 03/31/22 1053 04/02/22 0037  WBC  --  13.3*  --   --   --  16.2* 11.1* 8.0 7.4  LATICACIDVEN 4.7*  --  3.7*  --  2.9*  --   --  1.2  --   PROCALCITON  --   --   --  4.41  --  3.38 1.93  --   --    HypOtension History of chronic diastolic CHF Initially blood pressure low because of shock.  Required pressors.  Blood pressure improved PTA on Toprol 50 mg daily, Lasix 40 mg twice daily, Entresto 97/103 mg twice daily. Nitro initially held because of septic shock.  Being gradually resumed.  Currently on Lopressor 37.5 mg twice daily.  Blood pressure rising up.  I will resume Entresto twice daily and Lasix daily well this morning.   Continue to monitor heart rate and blood pressure.  A-fib/flutter second-degree AV block s/p PPM Continue to monitor in telemetry on metoprolol. Eliquis currently remains on hold.  Currently on full dose Lovenox while awaiting for TEE  Uncontrolled type 2 diabetes mellitus with hyperglycemia A1c 10 on 12/26/2021 PTA on Prandin  4 mg Premeal 3 times daily, metformin 500 mg twice daily Currently on Semglee 15 units nightly and sliding scale insulin with Accu-Cheks only.  Creatinine normalized.  I will resume both Prandin and metformin at this morning.  Cut back Semglee to 5 units for tonight and monitor. Recent Labs  Lab 04/01/22 0618 04/01/22 1107 04/01/22 1621 04/01/22 2059 04/02/22 0559  GLUCAP 163* 207* 210* 201* 903*   AKI Metabolic acidosis Creatinine was  elevated to 1.3 on admission.  Improved with hydration.  Serum bicarb level improved as well. Recent Labs    06/22/21 1214 09/22/21 1151 12/26/21 1218 03/28/22 1123 03/29/22 0522 03/30/22 0347 03/31/22 1053 04/02/22 0037  BUN 19 21 24* '17 18 13 9 12  '$ CREATININE 0.90 0.90 0.94 1.31* 1.10* 0.86 0.74 0.77  CO2 '27 26 25 '$ 20* 18* '24 25 24   '$ Hypokalemia/hypophosphatemia Potassium level improved.  Phosphorus level low at 1.5 on 12/29.  Replacement given.  Recheck tomorrow. Recent Labs  Lab 03/28/22 1123 03/29/22 0522 03/30/22 0347 03/30/22 1927 03/31/22 1053 04/02/22 0037  K 3.9 3.4* 3.4*  --  3.9 4.6  MG  --  2.0 2.2  --  2.2  --   PHOS  --  2.2* 1.5* 1.2* 1.5*  --    Acute metabolic encephalopathy Secondary to septic shock. Improved.  Presumed chronic SDH Initial CT head finding as above showing subdural collection. Repeat CT head 12/28 without changes.  No need of intervention per neurosurgery.  Hyperlipidemia PTA on Zetia 10 mg daily  Hypothyroidism Synthroid 75 mg daily  Anxiety/depression PTA on Effexor, Restoril, Ativan as needed.  Impaired mobility  PT eval obtained.  SNF recommended   Goals of care   Code Status: DNR   Scheduled Meds:  Chlorhexidine Gluconate Cloth  6 each Topical Daily   enoxaparin (LOVENOX) injection  120 mg Subcutaneous Q12H   ezetimibe  10 mg Oral Daily   furosemide  40 mg Oral Daily   insulin aspart  0-5 Units Subcutaneous QHS   insulin aspart  0-9 Units Subcutaneous TID WC   insulin glargine-yfgn  5 Units Subcutaneous QHS   levothyroxine  75 mcg Oral Q0600   metFORMIN  500 mg Oral BID WC   metoprolol tartrate  37.5 mg Oral BID   mouth rinse  15 mL Mouth Rinse 4 times per day   repaglinide  4 mg Oral TID with meals   [START ON 04/03/2022] rosuvastatin  10 mg Oral Q M,W,F-2000   sacubitril-valsartan  1 tablet Oral BID   sodium chloride flush  3 mL Intravenous Q12H   venlafaxine XR  75 mg Oral Q breakfast    PRN  meds: acetaminophen **OR** acetaminophen, docusate sodium, LORazepam, ondansetron **OR** ondansetron (ZOFRAN) IV, mouth rinse, polyethylene glycol   Infusions:   sodium chloride Stopped (03/28/22 2304)    ceFAZolin (ANCEF) IV 2 g (04/02/22 0126)    Skin assessment:     Nutritional status:  Body mass index is 48.67 kg/m.          Diet:  Diet Order             Diet Carb Modified Fluid consistency: Thin; Room service appropriate? Yes  Diet effective ____                   DVT prophylaxis:  Place and maintain sequential compression device Start: 03/29/22 0749 SCDs Start: 03/28/22 1953   Antimicrobials: Ancef IV Fluid: None Consultants: Cardiology, ID Family Communication: None at bedside  Status is: Inpatient  Continue in-hospital care because: Ongoing workup.  Pending TEE Level of care: Progressive   Dispo: The patient is from: Home              Anticipated d/c is to: Pending clinical course              Patient currently is not medically stable to d/c.   Difficult to place patient No    Antimicrobials: Anti-infectives (From admission, onward)    Start     Dose/Rate Route Frequency Ordered Stop   03/30/22 1300  vancomycin (VANCOREADY) IVPB 1500 mg/300 mL  Status:  Discontinued        1,500 mg 150 mL/hr over 120 Minutes Intravenous Every 48 hours 03/28/22 1315 03/29/22 0735   03/29/22 1000  ceFAZolin (ANCEF) IVPB 2g/100 mL premix        2 g 200 mL/hr over 30 Minutes Intravenous Every 8 hours 03/29/22 0742     03/29/22 0000  metroNIDAZOLE (FLAGYL) IVPB 500 mg  Status:  Discontinued        500 mg 100 mL/hr over 60 Minutes Intravenous Every 12 hours 03/28/22 1837 03/29/22 0735   03/28/22 2200  ceFEPIme (MAXIPIME) 2 g in sodium chloride 0.9 % 100 mL IVPB  Status:  Discontinued        2 g 200 mL/hr over 30 Minutes Intravenous Every 12 hours 03/28/22 1851 03/29/22 0735   03/28/22 1430  metroNIDAZOLE (FLAGYL) IVPB 500 mg        500 mg 100 mL/hr over 60  Minutes Intravenous  Once 03/28/22 1427 03/28/22 1655   03/28/22 1130  vancomycin (VANCOREADY) IVPB 2000 mg/400 mL        2,000 mg 200 mL/hr over 120 Minutes Intravenous  Once 03/28/22 1129 03/28/22 1457   03/28/22 1045  ceFEPIme (MAXIPIME) 2 g in sodium chloride 0.9 % 100 mL IVPB        2 g 200 mL/hr over 30 Minutes Intravenous  Once 03/28/22 1042 03/28/22 1203       Objective: Vitals:   04/02/22 0749 04/02/22 0920  BP: (!) 151/93   Pulse: (!) 106 (!) 106  Resp: (!) 27 (!) 24  Temp:    SpO2: 92% 93%    Intake/Output Summary (Last 24 hours) at 04/02/2022 1025 Last data filed at 04/02/2022 0900 Gross per 24 hour  Intake 3084 ml  Output 750 ml  Net 2334 ml   Filed Weights   03/30/22 1348 03/31/22 0432 04/02/22 0102  Weight: 122.9 kg 123.5 kg 120.7 kg   Weight change:  Body mass index is 48.67 kg/m.   Physical Exam: General exam: Pleasant, elderly, not in pain Skin: No rashes, lesions or ulcers. HEENT: Atraumatic, normocephalic, no obvious bleeding Lungs: Clear to auscultation bilaterally CVS: Remains slightly tachycardic, no murmur  GI/Abd soft, nontender, nondistended, bowel sound present CNS: Alert, awake, oriented to place and person Psychiatry: Mood appropriate Extremities: No pedal edema, no calf tenderness  Data Review: I have personally reviewed the laboratory data and studies available.  F/u labs ordered Unresulted Labs (From admission, onward)     Start     Ordered   04/03/22 0500  CBC with Differential/Platelet  Tomorrow morning,   R       Question:  Specimen collection method  Answer:  Lab=Lab collect   04/02/22 0728   04/03/22 0962  Basic metabolic panel  Tomorrow morning,   R       Question:  Specimen collection method  Answer:  Lab=Lab collect   04/02/22 0728   04/03/22 0500  CBC with Differential/Platelet  Tomorrow morning,   R       Question:  Specimen collection method  Answer:  Lab=Lab collect   04/02/22 1024   04/03/22 5183  Basic  metabolic panel  Tomorrow morning,   R       Question:  Specimen collection method  Answer:  Lab=Lab collect   04/02/22 1024   04/03/22 0500  Phosphorus  Tomorrow morning,   R       Question:  Specimen collection method  Answer:  Lab=Lab collect   04/02/22 1024            Total time spent in review of labs and imaging, patient evaluation, formulation of plan, documentation and communication with family 66 minutes  Signed, Terrilee Croak, MD [facility] 04/02/2022

## 2022-04-02 NOTE — Progress Notes (Addendum)
[place] for enoxaparin Indication: atrial fibrillation  Allergies  Allergen Reactions   Coreg [Carvedilol] Other (See Comments)    Weak legs   Relafen [Nabumetone] Other (See Comments)    Upset stomach   Calcium Other (See Comments)    Unknown reaction   Codeine Other (See Comments)    Unknown reaction   Lipitor [Atorvastatin] Other (See Comments)    Myalgias   Pacerone [Amiodarone] Other (See Comments)    Hand tremors   Vasotec [Enalapril] Cough   Vioxx [Rofecoxib] Other (See Comments)    Unknown reaction   Zocor [Simvastatin] Other (See Comments)    Myalgias    Patient Measurements: Height: '5\' 2"'$  (157.5 cm) Weight: 120.7 kg (266 lb 1.5 oz) IBW/kg (Calculated) : 50.1  Vital Signs: Temp: 98 F (36.7 C) (12/31 0700) Temp Source: Oral (12/31 0700) BP: 151/93 (12/31 0749) Pulse Rate: 106 (12/31 0749)  Labs: Recent Labs    03/31/22 1053 04/02/22 0037  HGB 10.4* 12.9  HCT 30.9* 39.0  PLT PLATELET CLUMPS NOTED ON SMEAR, COUNT APPEARS DECREASED 105*  CREATININE 0.74 0.77     Estimated Creatinine Clearance: 62.4 mL/min (by C-G formula based on SCr of 0.77 mg/dL).   Medical History: Past Medical History:  Diagnosis Date   Anxiety    Atrial fibrillation and flutter ([place])    detected by PPM interrogation (mostly atrial flutter)   AV block, 2nd degree 10/2012   s/p MDT ADDRL1 pacemaker implantation 10/10/2012 by [name]   Chronic diastolic heart failure ([place])    Depression    GERD (gastroesophageal reflux disease)    HH (hiatus hernia)    History of pulmonary embolism 10/2008   Bilateral   Hyperlipidemia    Hypertension    Hypothyroidism    Obesity    Osteoarthritis    Peripheral neuropathy    Right leg   Presence of permanent cardiac pacemaker    Pulmonary nodule    in the lingula. Noted on pre TAVR CT. Will require follow up   Renal insufficiency 2010   S/P TAVR (transcatheter aortic valve replacement)     Shingles 09/17/2014   right lower quadrant   Type II or unspecified type diabetes mellitus without mention of complication, not stated as uncontrolled 2009     Assessment: 16 yoF admitted with AMS and MSSA bacteremia. Pt has hx AFib on apixaban PTA which was held pending repeat head imaging. Pharmacy asked to resume anticoagulation with enoxaparin in case procedures needed in the future. Hgb 10.4 stable, PLTs 105 stable, SCr 0.77 at baseline.  Goal of Therapy:  Anti-Xa level 0.6-1 units/ml 4hrs after LMWH dose given Monitor platelets by anticoagulation protocol: Yes   Plan:  Continue Enoxaparin '120mg'$  (~'1mg'$ /kg) SQ q12h Follow CBC 1/1 with AM labs  Eliseo Gum, PharmD PGY1 Pharmacy Resident   04/02/2022  8:34 AM

## 2022-04-02 NOTE — Plan of Care (Signed)
  Problem: Education: Goal: Ability to describe self-care measures that may prevent or decrease complications (Diabetes Survival Skills Education) will improve Outcome: Progressing Goal: Individualized Educational Video(s) Outcome: Progressing   Problem: Fluid Volume: Goal: Ability to maintain a balanced intake and output will improve Outcome: Progressing   Problem: Health Behavior/Discharge Planning: Goal: Ability to identify and utilize available resources and services will improve Outcome: Progressing Goal: Ability to manage health-related needs will improve Outcome: Progressing   Problem: Metabolic: Goal: Ability to maintain appropriate glucose levels will improve Outcome: Progressing   Problem: Nutritional: Goal: Maintenance of adequate nutrition will improve Outcome: Progressing Goal: Progress toward achieving an optimal weight will improve Outcome: Progressing   Problem: Skin Integrity: Goal: Risk for impaired skin integrity will decrease Outcome: Progressing   Problem: Tissue Perfusion: Goal: Adequacy of tissue perfusion will improve Outcome: Progressing   Problem: Fluid Volume: Goal: Hemodynamic stability will improve Outcome: Progressing   Problem: Clinical Measurements: Goal: Diagnostic test results will improve Outcome: Progressing Goal: Signs and symptoms of infection will decrease Outcome: Progressing   Problem: Respiratory: Goal: Ability to maintain adequate ventilation will improve Outcome: Progressing   Problem: Education: Goal: Knowledge of General Education information will improve Description: Including pain rating scale, medication(s)/side effects and non-pharmacologic comfort measures Outcome: Progressing   Problem: Health Behavior/Discharge Planning: Goal: Ability to manage health-related needs will improve Outcome: Progressing   Problem: Clinical Measurements: Goal: Ability to maintain clinical measurements within normal limits will  improve Outcome: Progressing Goal: Will remain free from infection Outcome: Progressing Goal: Diagnostic test results will improve Outcome: Progressing Goal: Respiratory complications will improve Outcome: Progressing Goal: Cardiovascular complication will be avoided Outcome: Progressing   Problem: Activity: Goal: Risk for activity intolerance will decrease Outcome: Progressing   Problem: Nutrition: Goal: Adequate nutrition will be maintained Outcome: Progressing   Problem: Coping: Goal: Level of anxiety will decrease Outcome: Progressing   Problem: Elimination: Goal: Will not experience complications related to bowel motility Outcome: Progressing Goal: Will not experience complications related to urinary retention Outcome: Progressing   Problem: Pain Managment: Goal: General experience of comfort will improve Outcome: Progressing   Problem: Safety: Goal: Ability to remain free from injury will improve Outcome: Progressing   Problem: Skin Integrity: Goal: Risk for impaired skin integrity will decrease Outcome: Progressing   Problem: Coping: Goal: Ability to adjust to condition or change in health will improve Outcome: Not Progressing

## 2022-04-02 NOTE — Progress Notes (Signed)
Unit secretary stated patient felt sick. When primary nurse walked into room, patient had nasal cannula on her forehead. Patient stated " I can't drink my milk". Patient c/o of nausea. RN instructed patient to put nasal cannula in her nose. Per patient " It hurts my ear. It is sore".

## 2022-04-02 NOTE — TOC Progression Note (Signed)
Transition of Care [facility]) - Progression Note    Patient Details  Name: Jessica [name] MRN: [id number] Date of Birth: 1933-07-25  [facility]) CM/SW Contact  Emeterio Reeve, [place] Phone Number: 04/02/2022, 11:56 AM  Clinical Narrative:     LCSW met with pt at bedside with stepson. LCSW gave bed offers. Pt is leaning towards [facility] but would like to discuss it with family before making final decision.   Expected Discharge Plan: [place] Barriers to Discharge: Continued Medical Work up  Expected Discharge Plan and [place] arrangements for the past 2 months: [place] Determinants of Health (SDOH) Interventions [place]: No Food Insecurity (03/30/2022)  Housing: Low Risk  (03/30/2022)  Transportation Needs: No Transportation Needs (03/30/2022)  Utilities: Not At Risk (03/30/2022)  Alcohol Screen: Low Risk  (07/01/2021)  Depression (PHQ2-9): Low Risk  (07/01/2021)  Financial Resource Strain: Low Risk  (07/01/2021)  Physical Activity: Inactive (07/01/2021)  Social Connections: Socially Isolated (07/01/2021)  Stress: No Stress Concern Present (07/01/2021)  Tobacco Use: Low Risk  (03/28/2022)    Readmission Risk Interventions     No data to display         Emeterio Reeve, LCSW Clinical Social Worker

## 2022-04-02 NOTE — Progress Notes (Signed)
Patient stated " I wish can go ahead and died". When primary nurse asked the reasoning behind her expressing this statement, she stated "You don't know how I feel." MD made aware.

## 2022-04-03 DIAGNOSIS — A419 Sepsis, unspecified organism: Secondary | ICD-10-CM | POA: Diagnosis not present

## 2022-04-03 DIAGNOSIS — R6520 Severe sepsis without septic shock: Secondary | ICD-10-CM | POA: Diagnosis not present

## 2022-04-03 LAB — CBC WITH DIFFERENTIAL/PLATELET
Abs Immature Granulocytes: 0.1 10*3/uL — ABNORMAL HIGH (ref 0.00–0.07)
Basophils Absolute: 0.1 10*3/uL (ref 0.0–0.1)
Basophils Relative: 1 %
Eosinophils Absolute: 0 10*3/uL (ref 0.0–0.5)
Eosinophils Relative: 0 %
HCT: 30 % — ABNORMAL LOW (ref 36.0–46.0)
Hemoglobin: 10.1 g/dL — ABNORMAL LOW (ref 12.0–15.0)
Lymphocytes Relative: 25 %
Lymphs Abs: 2.5 10*3/uL (ref 0.7–4.0)
MCH: 31.3 pg (ref 26.0–34.0)
MCHC: 33.7 g/dL (ref 30.0–36.0)
MCV: 92.9 fL (ref 80.0–100.0)
Metamyelocytes Relative: 1 %
Monocytes Absolute: 1 10*3/uL (ref 0.1–1.0)
Monocytes Relative: 10 %
Neutro Abs: 6.2 10*3/uL (ref 1.7–7.7)
Neutrophils Relative %: 63 %
Platelets: 176 10*3/uL (ref 150–400)
RBC: 3.23 MIL/uL — ABNORMAL LOW (ref 3.87–5.11)
RDW: 13.8 % (ref 11.5–15.5)
WBC: 9.8 10*3/uL (ref 4.0–10.5)
nRBC: 0 % (ref 0.0–0.2)
nRBC: 0 /100 WBC

## 2022-04-03 LAB — BASIC METABOLIC PANEL
Anion gap: 10 (ref 5–15)
BUN: 8 mg/dL (ref 8–23)
CO2: 25 mmol/L (ref 22–32)
Calcium: 8.7 mg/dL — ABNORMAL LOW (ref 8.9–10.3)
Chloride: 100 mmol/L (ref 98–111)
Creatinine, Ser: 0.64 mg/dL (ref 0.44–1.00)
GFR, Estimated: >60 mL/min (ref >60)
Glucose, Bld: 127 mg/dL — ABNORMAL HIGH (ref 70–99)
Potassium: 3.5 mmol/L (ref 3.5–5.1)
Sodium: 135 mmol/L (ref 135–145)

## 2022-04-03 LAB — GLUCOSE, CAPILLARY
Glucose-Capillary: 120 mg/dL — ABNORMAL HIGH (ref 70–99)
Glucose-Capillary: 158 mg/dL — ABNORMAL HIGH (ref 70–99)
Glucose-Capillary: 95 mg/dL (ref 70–99)
Glucose-Capillary: 139 mg/dL — ABNORMAL HIGH (ref 70–99)

## 2022-04-03 LAB — PHOSPHORUS: Phosphorus: 3.3 mg/dL (ref 2.5–4.6)

## 2022-04-03 MED ORDER — FUROSEMIDE 10 MG/ML IJ SOLN
20.0000 mg | Freq: Once | INTRAMUSCULAR | Status: AC
  Administered 2022-04-03: 20 mg via INTRAVENOUS
  Filled 2022-04-03: qty 2

## 2022-04-03 MED ORDER — LOPERAMIDE HCL 2 MG PO CAPS: 2.0000 mg | ORAL_CAPSULE | ORAL | Status: DC | PRN

## 2022-04-03 MED ORDER — LORAZEPAM 2 MG/ML IJ SOLN
0.5000 mg | Freq: Once | INTRAMUSCULAR | Status: AC
  Administered 2022-04-03: 0.5 mg via INTRAVENOUS
  Filled 2022-04-03: qty 1

## 2022-04-03 MED ORDER — IPRATROPIUM-ALBUTEROL 0.5-2.5 (3) MG/3ML IN SOLN
3.0000 mL | Freq: Four times a day (QID) | RESPIRATORY_TRACT | Status: DC | PRN
  Administered 2022-04-03: 3 mL via RESPIRATORY_TRACT
  Filled 2022-04-03: qty 3

## 2022-04-03 MED ORDER — OXYCODONE-ACETAMINOPHEN 5-325 MG PO TABS
1.0000 | ORAL_TABLET | Freq: Four times a day (QID) | ORAL | Status: DC | PRN
  Administered 2022-04-03 – 2022-04-09 (×9): 1 via ORAL
  Filled 2022-04-03 (×9): qty 1

## 2022-04-03 NOTE — TOC Progression Note (Signed)
Transition of Care [facility]) - Progression Note    Patient Details  Name: [name] MRN: [id number] Date of Birth: 07-22-1933  Transition of Care [facility]) CM/SW [place], LCSW Phone Number: 04/03/2022, 2:52 PM  Clinical Narrative:  CSW spoke with Jessica [name] family at bedside. CSW provided Jessica [name] with list of bed offers. Jessica [name] would like to dc to [facility]. CSW reached out to [facility] and they stated there is currently a wait and instructed CSW to reach out as dc gets closer. CSW communicated this information with Jessica [name] and family.     Expected Discharge Plan: [name] Barriers to Discharge: Continued Medical Work up  Expected Discharge Plan and [place] arrangements for the past 2 months: [place] Determinants of Health (SDOH) Interventions [place]: No Food Insecurity (03/30/2022)  Housing: Low Risk  (03/30/2022)  Transportation Needs: No Transportation Needs (03/30/2022)  Utilities: Not At Risk (03/30/2022)  Alcohol Screen: Low Risk  (07/01/2021)  Depression (PHQ2-9): Low Risk  (07/01/2021)  Financial Resource Strain: Low Risk  (07/01/2021)  Physical Activity: Inactive (07/01/2021)  Social Connections: Socially Isolated (07/01/2021)  Stress: No Stress Concern Present (07/01/2021)  Tobacco Use: Low Risk  (03/28/2022)    Readmission Risk Interventions     No data to display         [name], MSW, LCSWA, LCASA Transitions of Care  Clinical Social Worker I

## 2022-04-03 NOTE — Plan of Care (Signed)
  Problem: Education: Goal: Ability to describe self-care measures that may prevent or decrease complications (Diabetes Survival Skills Education) will improve Outcome: Not Progressing Goal: Individualized Educational Video(s) Outcome: Not Progressing   Problem: Coping: Goal: Ability to adjust to condition or change in health will improve Outcome: Not Progressing   Problem: Fluid Volume: Goal: Ability to maintain a balanced intake and output will improve Outcome: Not Progressing   Problem: Health Behavior/Discharge Planning: Goal: Ability to identify and utilize available resources and services will improve Outcome: Not Progressing Goal: Ability to manage health-related needs will improve Outcome: Not Progressing   Problem: Metabolic: Goal: Ability to maintain appropriate glucose levels will improve Outcome: Not Progressing   Problem: Nutritional: Goal: Maintenance of adequate nutrition will improve Outcome: Not Progressing Goal: Progress toward achieving an optimal weight will improve Outcome: Not Progressing   Problem: Skin Integrity: Goal: Risk for impaired skin integrity will decrease Outcome: Not Progressing   Problem: Tissue Perfusion: Goal: Adequacy of tissue perfusion will improve Outcome: Not Progressing   Problem: Fluid Volume: Goal: Hemodynamic stability will improve Outcome: Not Progressing   Problem: Clinical Measurements: Goal: Diagnostic test results will improve Outcome: Not Progressing Goal: Signs and symptoms of infection will decrease Outcome: Not Progressing   Problem: Respiratory: Goal: Ability to maintain adequate ventilation will improve Outcome: Not Progressing   Problem: Education: Goal: Knowledge of General Education information will improve Description: Including pain rating scale, medication(s)/side effects and non-pharmacologic comfort measures Outcome: Not Progressing   Problem: Health Behavior/Discharge Planning: Goal: Ability  to manage health-related needs will improve Outcome: Not Progressing   Problem: Clinical Measurements: Goal: Ability to maintain clinical measurements within normal limits will improve Outcome: Not Progressing Goal: Will remain free from infection Outcome: Not Progressing Goal: Diagnostic test results will improve Outcome: Not Progressing Goal: Respiratory complications will improve Outcome: Not Progressing Goal: Cardiovascular complication will be avoided Outcome: Not Progressing   Problem: Activity: Goal: Risk for activity intolerance will decrease Outcome: Not Progressing   Problem: Nutrition: Goal: Adequate nutrition will be maintained Outcome: Not Progressing   Problem: Coping: Goal: Level of anxiety will decrease Outcome: Not Progressing   Problem: Elimination: Goal: Will not experience complications related to bowel motility Outcome: Not Progressing Goal: Will not experience complications related to urinary retention Outcome: Not Progressing   Problem: Pain Managment: Goal: General experience of comfort will improve Outcome: Not Progressing   Problem: Safety: Goal: Ability to remain free from injury will improve Outcome: Not Progressing   Problem: Skin Integrity: Goal: Risk for impaired skin integrity will decrease Outcome: Not Progressing

## 2022-04-03 NOTE — Progress Notes (Signed)
PROGRESS NOTE  Jessica [name]  DOB: 21-Sep-1933  PCP: [name], MD HOZ:[id number]  DOA: 03/28/2022  LOS: 6 days  Hospital Day: 7  Brief narrative: Jessica [name] is a 89 y.o. female with PMH significant for morbid obesity, DM2, HTN, HLD, A-fib/flutter on Eliquis, second-degree AV block, s/p PPM, chronic diastolic CHF, AS s/p TAVR, history of PE, GERD, hypothyroidism, osteoarthritis, anxiety/depression. 12/26, patient was brought to the ED by EMS from home for altered mental status. Per report, she had not been feeling well for few days, had decreased oral intake, nausea, vomiting and dysuria and essentially bedridden for the last few days.  In the ED, patient had a fever of 101.7, blood pressure low in 90s.  Labs showed WBC count at 13.3, lactic acid elevated to 4.7 CT abdomen pelvis was negative for any acute findings, showed possible atelectasis versus bilateral pneumonia CT head and CTA head and neck were obtained which showed questionable low-density subdural collection over the left cerebral hemisphere along with mild chronic small vessel ischemic changes. EDP discussed with neurosurgery, suspected the imaging findings were due to chronic SDH.  Recommended holding Eliquis and repeating CT head in 48 hours.  Initially admitted to [facility] Because of low blood pressure, she was given IV hydration but was limited due to pulmonary edema and new oxygen requirement.  Blood pressure remained low and hence PCCM was consulted. She was started on Levophed and upgraded to ICU. Blood culture sent on admission grew MSSA ID was consulted.  12/29, transferred out to [facility]  Subjective: Patient was seen and examined this morning.   Sitting up in bed.  Not in distress.  Overall feels poor.  Is depressed.  States 'I am aching everywhere.'  And wants some pain medicines. On low-flow oxygen.  Assessment and plan: Septic shock -POA MSSA bacteremia Presented with fever, lethargy,  leukocytosis, lactic acid elevation Blood culture sent on admission grew MSSA.  Source unclear at this time ID following.  Currently on IV Ancef ID recommended TEE for evaluation of TAVR and PPM device.  Also recommended consideration of PPM extraction.  12/21, I called cardiology and requested for TEE.  On schedule for Tuesday or Wednesday. WBC count, lactic acid level improved. Recent Labs  Lab 03/28/22 1041 03/28/22 1123 03/28/22 1241 03/28/22 1732 03/28/22 1947 03/29/22 0522 03/30/22 0347 03/31/22 1053 04/02/22 0037 04/03/22 0035  WBC  --    < >  --   --   --  16.2* 11.1* 8.0 7.4 9.8  LATICACIDVEN 4.7*  --  3.7*  --  2.9*  --   --  1.2  --   --   PROCALCITON  --   --   --  4.41  --  3.38 1.93  --   --   --    < > = values in this interval not displayed.   HypOtension History of chronic diastolic CHF Initially blood pressure low because of shock.  Required pressors.  Blood pressure improved PTA on Toprol 50 mg daily, Lasix 40 mg twice daily, Entresto 97/103 mg twice daily. Medicines were initially held because of septic shock.  Gradually removed.  Currently on Lopressor 37.5 mg twice daily, Entresto twice daily and Lasix daily.  If blood pressure allows, will resume Lasix to twice daily as before. Continue to monitor heart rate and blood pressure.  A-fib/flutter second-degree AV block s/p PPM Continue to monitor in telemetry on metoprolol. Eliquis currently remains on hold.  Currently on full dose Lovenox  while awaiting for TEE  Uncontrolled type 2 diabetes mellitus with hyperglycemia A1c 10 on 12/26/2021 PTA on Prandin 4 mg Premeal 3 times daily, metformin 500 mg twice daily. Both resumed.  Also on Semglee at 5 units nightly along with sliding scale insulin. Recent Labs  Lab 04/02/22 0559 04/02/22 1127 04/02/22 1613 04/02/22 2127 04/03/22 0604  GLUCAP 164* 186* 138* 144* 540*   AKI Metabolic acidosis Creatinine was elevated to 1.3 on admission.  Improved with  hydration.  Serum bicarb level improved as well. Recent Labs    06/22/21 1214 09/22/21 1151 12/26/21 1218 03/28/22 1123 03/29/22 0522 03/30/22 0347 03/31/22 1053 04/02/22 0037 04/03/22 0035  BUN 19 21 24* '17 18 13 9 12 8  '$ CREATININE 0.90 0.90 0.94 1.31* 1.10* 0.86 0.74 0.77 0.64  CO2 '27 26 25 '$ 20* 18* '24 25 24 25   '$ Hypokalemia/hypophosphatemia Potassium level improved.  Phosphorus level low at 1.5 on 12/29.  Replacement given.  Recheck tomorrow. Recent Labs  Lab 03/29/22 0522 03/30/22 0347 03/30/22 1927 03/31/22 1053 04/02/22 0037 04/03/22 0035  K 3.4* 3.4*  --  3.9 4.6 3.5  MG 2.0 2.2  --  2.2  --   --   PHOS 2.2* 1.5* 1.2* 1.5*  --  3.3   Acute metabolic encephalopathy Secondary to septic shock. Improved.  Presumed chronic SDH Initial CT head finding as above showing subdural collection. Repeat CT head 12/28 without changes.  No need of intervention per neurosurgery.  Hyperlipidemia PTA on Zetia 10 mg daily  Hypothyroidism Synthroid 75 mg daily  Anxiety/depression PTA on Effexor, Restoril, Ativan as needed. Add Percocet as needed today.  Impaired mobility  PT eval obtained.  SNF recommended   Goals of care   Code Status: DNR   Scheduled Meds:  Chlorhexidine Gluconate Cloth  6 each Topical Daily   enoxaparin (LOVENOX) injection  120 mg Subcutaneous Q12H   ezetimibe  10 mg Oral Daily   furosemide  40 mg Oral Daily   insulin aspart  0-5 Units Subcutaneous QHS   insulin aspart  0-9 Units Subcutaneous TID WC   insulin glargine-yfgn  5 Units Subcutaneous QHS   levothyroxine  75 mcg Oral Q0600   metFORMIN  500 mg Oral BID WC   metoprolol tartrate  37.5 mg Oral BID   mouth rinse  15 mL Mouth Rinse 4 times per day   repaglinide  4 mg Oral TID with meals   rosuvastatin  10 mg Oral Q M,W,F-2000   sacubitril-valsartan  1 tablet Oral BID   sodium chloride flush  3 mL Intravenous Q12H   venlafaxine XR  75 mg Oral Q breakfast    PRN meds: docusate sodium,  LORazepam, ondansetron **OR** ondansetron (ZOFRAN) IV, mouth rinse, oxyCODONE-acetaminophen, polyethylene glycol   Infusions:   sodium chloride 250 mL (04/03/22 0827)    ceFAZolin (ANCEF) IV 2 g (04/03/22 9811)    Skin assessment:     Nutritional status:  Body mass index is 48.67 kg/m.          Diet:  Diet Order             Diet Carb Modified Fluid consistency: Thin; Room service appropriate? Yes  Diet effective ____                   DVT prophylaxis:  Place and maintain sequential compression device Start: 03/29/22 0749 SCDs Start: 03/28/22 1953   Antimicrobials: Ancef IV Fluid: None Consultants: Cardiology, ID Family Communication: None at bedside  Status is: Inpatient  Continue in-hospital care because: Ongoing workup.  Pending TEE Tuesday or Wednesday. Level of care: Progressive   Dispo: The patient is from: Home              Anticipated d/c is to: Pending clinical course              Patient currently is not medically stable to d/c.   Difficult to place patient No    Antimicrobials: Anti-infectives (From admission, onward)    Start     Dose/Rate Route Frequency Ordered Stop   03/30/22 1300  vancomycin (VANCOREADY) IVPB 1500 mg/300 mL  Status:  Discontinued        1,500 mg 150 mL/hr over 120 Minutes Intravenous Every 48 hours 03/28/22 1315 03/29/22 0735   03/29/22 1000  ceFAZolin (ANCEF) IVPB 2g/100 mL premix        2 g 200 mL/hr over 30 Minutes Intravenous Every 8 hours 03/29/22 0742     03/29/22 0000  metroNIDAZOLE (FLAGYL) IVPB 500 mg  Status:  Discontinued        500 mg 100 mL/hr over 60 Minutes Intravenous Every 12 hours 03/28/22 1837 03/29/22 0735   03/28/22 2200  ceFEPIme (MAXIPIME) 2 g in sodium chloride 0.9 % 100 mL IVPB  Status:  Discontinued        2 g 200 mL/hr over 30 Minutes Intravenous Every 12 hours 03/28/22 1851 03/29/22 0735   03/28/22 1430  metroNIDAZOLE (FLAGYL) IVPB 500 mg        500 mg 100 mL/hr over 60 Minutes  Intravenous  Once 03/28/22 1427 03/28/22 1655   03/28/22 1130  vancomycin (VANCOREADY) IVPB 2000 mg/400 mL        2,000 mg 200 mL/hr over 120 Minutes Intravenous  Once 03/28/22 1129 03/28/22 1457   03/28/22 1045  ceFEPIme (MAXIPIME) 2 g in sodium chloride 0.9 % 100 mL IVPB        2 g 200 mL/hr over 30 Minutes Intravenous  Once 03/28/22 1042 03/28/22 1203       Objective: Vitals:   04/03/22 0400 04/03/22 0838  BP: (!) 147/71 (!) 140/70  Pulse: 77 81  Resp: 19 (!) 26  Temp: (!) 97.2 F (36.2 C) 97.8 F (36.6 C)  SpO2: 96% 93%    Intake/Output Summary (Last 24 hours) at 04/03/2022 0938 Last data filed at 04/03/2022 0600 Gross per 24 hour  Intake 520.18 ml  Output 1250 ml  Net -729.82 ml   Filed Weights   03/30/22 1348 03/31/22 0432 04/02/22 0102  Weight: 122.9 kg 123.5 kg 120.7 kg   Weight change:  Body mass index is 48.67 kg/m.   Physical Exam: General exam: Pleasant, elderly, not in pain Skin: No rashes, lesions or ulcers. HEENT: Atraumatic, normocephalic, no obvious bleeding Lungs: Clear to auscultation bilaterally. CVS: Remains slightly tachycardic, no murmur  GI/Abd soft, nontender, nondistended, bowel sound present CNS: Alert, awake, oriented to place and person Psychiatry: Sad affect. Extremities: No pedal edema, no calf tenderness  Data Review: I have personally reviewed the laboratory data and studies available.  F/u labs ordered Unresulted Labs (From admission, onward)     Start     Ordered   04/06/22 0500  CBC  Once-Timed,   R       Question:  Specimen collection method  Answer:  Lab=Lab collect   04/03/22 0925   04/03/22 0500  CBC with Differential/Platelet  Tomorrow morning,   R       Question:  Specimen collection method  Answer:  Lab=Lab collect   04/02/22 1024            Total time spent in review of labs and imaging, patient evaluation, formulation of plan, documentation and communication with family 53 minutes  Signed, Terrilee Croak,  MD [facility] 04/03/2022

## 2022-04-03 NOTE — Progress Notes (Signed)
1045- Cleaned patient of a bowel movement and she had complaints of back pain and mild shortness or breath.  Patient noted to have expiratory wheezes.  MD made aware and order received for PRN DUO nebs and a x1 dose of IV lasix.  RT notified of order but Probation officer gave treatment for RT.  Oxygen at this time turned up to 4L from 3L.  Post treatment patient resting mildly labored breathing.  1130- Patient yelling out that she was on fire and couldn't breath.  At bedside patient noted to have another incont. BM.  Patient cleaned of this and then became even more labored in breathing, tachypnic, and sats dropped to 84%.  Oxygen was turned up to 6L at this time.  MD messaged to please assess patient at bedside.    1140-  MD at bedside to assess patient.  Received an order for x1 dose IV ativan.  1145- Ativan given patient already with a calmer demeanor prior to giving.  Family arrived to bedside after administration of medication.  MD made aware of family at bedside.  Gave a brief update of patients morning.

## 2022-04-04 ENCOUNTER — Ambulatory Visit: Payer: Medicare Other | Admitting: Internal Medicine

## 2022-04-04 DIAGNOSIS — T826XXD Infection and inflammatory reaction due to cardiac valve prosthesis, subsequent encounter: Secondary | ICD-10-CM | POA: Diagnosis not present

## 2022-04-04 DIAGNOSIS — R6520 Severe sepsis without septic shock: Secondary | ICD-10-CM | POA: Diagnosis not present

## 2022-04-04 DIAGNOSIS — T827XXA Infection and inflammatory reaction due to other cardiac and vascular devices, implants and grafts, initial encounter: Secondary | ICD-10-CM

## 2022-04-04 DIAGNOSIS — B9561 Methicillin susceptible Staphylococcus aureus infection as the cause of diseases classified elsewhere: Secondary | ICD-10-CM | POA: Diagnosis present

## 2022-04-04 DIAGNOSIS — A419 Sepsis, unspecified organism: Secondary | ICD-10-CM | POA: Diagnosis not present

## 2022-04-04 DIAGNOSIS — I38 Endocarditis, valve unspecified: Secondary | ICD-10-CM

## 2022-04-04 DIAGNOSIS — T826XXA Infection and inflammatory reaction due to cardiac valve prosthesis, initial encounter: Secondary | ICD-10-CM | POA: Diagnosis present

## 2022-04-04 LAB — CULTURE, BLOOD (ROUTINE X 2)
Culture: NO GROWTH
Culture: NO GROWTH
Special Requests: ADEQUATE
Special Requests: ADEQUATE

## 2022-04-04 LAB — GLUCOSE, CAPILLARY
Glucose-Capillary: 127 mg/dL — ABNORMAL HIGH (ref 70–99)
Glucose-Capillary: 143 mg/dL — ABNORMAL HIGH (ref 70–99)
Glucose-Capillary: 142 mg/dL — ABNORMAL HIGH (ref 70–99)
Glucose-Capillary: 113 mg/dL — ABNORMAL HIGH (ref 70–99)

## 2022-04-04 MED ORDER — METOPROLOL TARTRATE 25 MG PO TABS
25.0000 mg | ORAL_TABLET | Freq: Two times a day (BID) | ORAL | Status: DC
  Administered 2022-04-04 – 2022-04-09 (×10): 25 mg via ORAL
  Filled 2022-04-04 (×10): qty 1

## 2022-04-04 MED ORDER — FUROSEMIDE 10 MG/ML IJ SOLN
40.0000 mg | Freq: Two times a day (BID) | INTRAMUSCULAR | Status: DC
  Administered 2022-04-04 – 2022-04-09 (×12): 40 mg via INTRAVENOUS
  Filled 2022-04-04 (×12): qty 4

## 2022-04-04 NOTE — TOC Progression Note (Signed)
Transition of Care [facility]) - Progression Note    Patient Details  Name: Jessica [name] MRN: [id number] Date of Birth: 10-Feb-1934  Transition of Care [facility]) CM/SW [place], LCSW Phone Number: 04/04/2022, 3:24 PM  Clinical Narrative:   CSW met with pt and family at beside. Family has now decided they do not want [facility] and would rather choose [facility]. CSW reached out to [facility] to confirm pt can dc there. [place] stated they would check for female beds and notify CSW.     Expected Discharge Plan: [place] Barriers to Discharge: Continued Medical Work up  Expected Discharge Plan and [place] arrangements for the past 2 months: [place] Determinants of Health (SDOH) Interventions [place]: No Food Insecurity (03/30/2022)  Housing: Low Risk  (03/30/2022)  Transportation Needs: No Transportation Needs (03/30/2022)  Utilities: Not At Risk (03/30/2022)  Alcohol Screen: Low Risk  (07/01/2021)  Depression (PHQ2-9): Low Risk  (07/01/2021)  Financial Resource Strain: Low Risk  (07/01/2021)  Physical Activity: Inactive (07/01/2021)  Social Connections: Socially Isolated (07/01/2021)  Stress: No Stress Concern Present (07/01/2021)  Tobacco Use: Low Risk  (03/28/2022)    Readmission Risk Interventions     No data to display         [name], MSW, LCSWA, LCASA Transitions of Care  Clinical Social Worker I

## 2022-04-04 NOTE — Progress Notes (Signed)
[place] for enoxaparin Indication: atrial fibrillation  Allergies  Allergen Reactions   Coreg [Carvedilol] Other (See Comments)    Weak legs   Relafen [Nabumetone] Other (See Comments)    Upset stomach   Calcium Other (See Comments)    Unknown reaction   Codeine Other (See Comments)    Unknown reaction   Lipitor [Atorvastatin] Other (See Comments)    Myalgias   Pacerone [Amiodarone] Other (See Comments)    Hand tremors   Vasotec [Enalapril] Cough   Vioxx [Rofecoxib] Other (See Comments)    Unknown reaction   Zocor [Simvastatin] Other (See Comments)    Myalgias    Patient Measurements: Height: '5\' 2"'$  (157.5 cm) Weight: 118.9 kg (262 lb 2 oz) IBW/kg (Calculated) : 50.1  Vital Signs: Temp: 97.9 F (36.6 C) (01/02 0700) Temp Source: Oral (01/02 0700) BP: 135/78 (01/02 0800) Pulse Rate: 72 (01/02 0800)  Labs: Recent Labs    04/02/22 0037 04/03/22 0035  HGB 12.9 10.1*  HCT 39.0 30.0*  PLT 105* 176  CREATININE 0.77 0.64     Estimated Creatinine Clearance: 61.8 mL/min (by C-G formula based on SCr of 0.64 mg/dL).   Medical History: Past Medical History:  Diagnosis Date   Anxiety    Atrial fibrillation and flutter ([place])    detected by PPM interrogation (mostly atrial flutter)   AV block, 2nd degree 10/2012   s/p MDT ADDRL1 pacemaker implantation 10/10/2012 by [name]   Chronic diastolic heart failure ([place])    Depression    GERD (gastroesophageal reflux disease)    HH (hiatus hernia)    History of pulmonary embolism 10/2008   Bilateral   Hyperlipidemia    Hypertension    Hypothyroidism    Obesity    Osteoarthritis    Peripheral neuropathy    Right leg   Presence of permanent cardiac pacemaker    Pulmonary nodule    in the lingula. Noted on pre TAVR CT. Will require follow up   Renal insufficiency 2010   S/P TAVR (transcatheter aortic valve replacement)    Shingles 09/17/2014   right lower quadrant   Type II  or unspecified type diabetes mellitus without mention of complication, not stated as uncontrolled 2009     Assessment: 52 yoF admitted with AMS and MSSA bacteremia. Pt has hx AFib on apixaban PTA which was held pending repeat head imaging. Pharmacy asked to resume anticoagulation with enoxaparin in case procedures needed in the future. CBC and Cr stable, TEE tomorrow  Goal of Therapy:  Anti-Xa level 0.6-1 units/ml 4hrs after LMWH dose given Monitor platelets by anticoagulation protocol: Yes   Plan:  Continue Enoxaparin '120mg'$  (~'1mg'$ /kg) SQ q12h Follow CBC   Arrie Senate, PharmD, BCPS, [facility] Clinical Pharmacist Please check AMION for all [facility] Pharmacy numbers 04/04/2022

## 2022-04-04 NOTE — Progress Notes (Signed)
Subjective: No new complaints   Antibiotics:  Anti-infectives (From admission, onward)    Start     Dose/Rate Route Frequency Ordered Stop   03/30/22 1300  vancomycin (VANCOREADY) IVPB 1500 mg/300 mL  Status:  Discontinued        1,500 mg 150 mL/hr over 120 Minutes Intravenous Every 48 hours 03/28/22 1315 03/29/22 0735   03/29/22 1000  ceFAZolin (ANCEF) IVPB 2g/100 mL premix        2 g 200 mL/hr over 30 Minutes Intravenous Every 8 hours 03/29/22 0742     03/29/22 0000  metroNIDAZOLE (FLAGYL) IVPB 500 mg  Status:  Discontinued        500 mg 100 mL/hr over 60 Minutes Intravenous Every 12 hours 03/28/22 1837 03/29/22 0735   03/28/22 2200  ceFEPIme (MAXIPIME) 2 g in sodium chloride 0.9 % 100 mL IVPB  Status:  Discontinued        2 g 200 mL/hr over 30 Minutes Intravenous Every 12 hours 03/28/22 1851 03/29/22 0735   03/28/22 1430  metroNIDAZOLE (FLAGYL) IVPB 500 mg        500 mg 100 mL/hr over 60 Minutes Intravenous  Once 03/28/22 1427 03/28/22 1655   03/28/22 1130  vancomycin (VANCOREADY) IVPB 2000 mg/400 mL        2,000 mg 200 mL/hr over 120 Minutes Intravenous  Once 03/28/22 1129 03/28/22 1457   03/28/22 1045  ceFEPIme (MAXIPIME) 2 g in sodium chloride 0.9 % 100 mL IVPB        2 g 200 mL/hr over 30 Minutes Intravenous  Once 03/28/22 1042 03/28/22 1203       Medications: Scheduled Meds:  enoxaparin (LOVENOX) injection  120 mg Subcutaneous Q12H   ezetimibe  10 mg Oral Daily   furosemide  40 mg Intravenous BID   insulin aspart  0-5 Units Subcutaneous QHS   insulin aspart  0-9 Units Subcutaneous TID WC   insulin glargine-yfgn  5 Units Subcutaneous QHS   levothyroxine  75 mcg Oral Q0600   metFORMIN  500 mg Oral BID WC   metoprolol tartrate  25 mg Oral BID   mouth rinse  15 mL Mouth Rinse 4 times per day   repaglinide  4 mg Oral TID with meals   rosuvastatin  10 mg Oral Q M,W,F-2000   sacubitril-valsartan  1 tablet Oral BID   sodium chloride flush  3 mL  Intravenous Q12H   venlafaxine XR  75 mg Oral Q breakfast   Continuous Infusions:  sodium chloride Stopped (04/03/22 1058)    ceFAZolin (ANCEF) IV 2 g (04/04/22 0833)   PRN Meds:.docusate sodium, ipratropium-albuterol, loperamide, LORazepam, ondansetron **OR** ondansetron (ZOFRAN) IV, mouth rinse, oxyCODONE-acetaminophen, polyethylene glycol    Objective: Weight change:   Intake/Output Summary (Last 24 hours) at 04/04/2022 1215 Last data filed at 04/04/2022 1044 Gross per 24 hour  Intake 460 ml  Output 1650 ml  Net -1190 ml   Blood pressure 134/67, pulse 72, temperature 98.6 F (37 C), temperature source Oral, resp. rate 20, height '5\' 2"'$  (1.575 m), weight 118.9 kg, SpO2 98 %. Temp:  [97.9 F (36.6 C)-98.6 F (37 C)] 98.6 F (37 C) (01/02 1043) Pulse Rate:  [72-99] 72 (01/02 1043) Resp:  [15-22] 20 (01/02 1043) BP: (108-154)/(63-134) 134/67 (01/02 1043) SpO2:  [94 %-100 %] 98 % (01/02 1043) Weight:  [118.9 kg] 118.9 kg (01/02 0400)  Physical Exam: Physical Exam Constitutional:      General: She is not  in acute distress.    Appearance: She is well-developed. She is not diaphoretic.  HENT:     Head: Normocephalic and atraumatic.     Right Ear: External ear normal.     Left Ear: External ear normal.     Mouth/Throat:     Pharynx: No oropharyngeal exudate.  Eyes:     General: No scleral icterus.    Conjunctiva/sclera: Conjunctivae normal.     Pupils: Pupils are equal, round, and reactive to light.  Cardiovascular:     Rate and Rhythm: Regular rhythm. Tachycardia present.     Heart sounds: Normal heart sounds. No murmur heard.    No friction rub.  Pulmonary:     Effort: Pulmonary effort is normal. No respiratory distress.     Breath sounds: Normal breath sounds. No stridor. No wheezing or rales.  Abdominal:     General: Bowel sounds are normal. There is no distension.     Palpations: Abdomen is soft.     Tenderness: There is no abdominal tenderness. There is no  rebound.  Musculoskeletal:        General: No tenderness. Normal range of motion.  Lymphadenopathy:     Cervical: No cervical adenopathy.  Skin:    General: Skin is warm and dry.     Coloration: Skin is not pale.     Findings: No erythema or rash.  Neurological:     General: No focal deficit present.     Mental Status: She is alert and oriented to person, place, and time.     Motor: No abnormal muscle tone.     Coordination: Coordination normal.  Psychiatric:        Mood and Affect: Mood normal.        Behavior: Behavior normal.        Thought Content: Thought content normal.        Judgment: Judgment normal.      CBC:    BMET Recent Labs    04/02/22 0037 04/03/22 0035  NA 135 135  K 4.6 3.5  CL 102 100  CO2 24 25  GLUCOSE 168* 127*  BUN 12 8  CREATININE 0.77 0.64  CALCIUM 8.5* 8.7*     Liver Panel  No results for input(s): "PROT", "ALBUMIN", "AST", "ALT", "ALKPHOS", "BILITOT", "BILIDIR", "IBILI" in the last 72 hours.     Sedimentation Rate No results for input(s): "ESRSEDRATE" in the last 72 hours. C-Reactive Protein No results for input(s): "CRP" in the last 72 hours.  Micro Results: Recent Results (from the past 720 hour(s))  Blood Culture (routine x 2)     Status: Abnormal   Collection Time: 03/28/22 10:41 AM   Specimen: BLOOD  Result Value Ref Range Status   Specimen Description BLOOD SITE NOT SPECIFIED  Final   Special Requests   Final    BOTTLES DRAWN AEROBIC AND ANAEROBIC Blood Culture adequate volume   Culture  Setup Time   Final    GRAM POSITIVE COCCI IN CLUSTERS IN BOTH AEROBIC AND ANAEROBIC BOTTLES CRITICAL RESULT CALLED TO, READ BACK BY AND VERIFIED WITH: T RUDISILL,PHARMD'@0052'$  03/29/22 [place] Performed at [facility], [employer] [address], [place], [place] [address]    Culture STAPHYLOCOCCUS AUREUS (A)  Final   Report Status 03/31/2022 FINAL  Final   Organism ID, Bacteria STAPHYLOCOCCUS AUREUS  Final      Susceptibility    Staphylococcus aureus - MIC*    CIPROFLOXACIN <=0.5 SENSITIVE Sensitive     ERYTHROMYCIN <=0.25 SENSITIVE  Sensitive     GENTAMICIN <=0.5 SENSITIVE Sensitive     OXACILLIN 0.5 SENSITIVE Sensitive     TETRACYCLINE <=1 SENSITIVE Sensitive     VANCOMYCIN <=0.5 SENSITIVE Sensitive     TRIMETH/SULFA <=10 SENSITIVE Sensitive     CLINDAMYCIN <=0.25 SENSITIVE Sensitive     RIFAMPIN <=0.5 SENSITIVE Sensitive     Inducible Clindamycin NEGATIVE Sensitive     * STAPHYLOCOCCUS AUREUS  Blood Culture ID Panel (Reflexed)     Status: Abnormal   Collection Time: 03/28/22 10:41 AM  Result Value Ref Range Status   Enterococcus faecalis NOT DETECTED NOT DETECTED Final   Enterococcus Faecium NOT DETECTED NOT DETECTED Final   Listeria monocytogenes NOT DETECTED NOT DETECTED Final   Staphylococcus species DETECTED (A) NOT DETECTED Final    Comment: CRITICAL RESULT CALLED TO, READ BACK BY AND VERIFIED WITH: T RUDISILL,PHARMD'@0052'$  03/29/22 [place]    Staphylococcus aureus (BCID) DETECTED (A) NOT DETECTED Final    Comment: CRITICAL RESULT CALLED TO, READ BACK BY AND VERIFIED WITH: T RUDISILL,PHARMD'@0052'$  03/29/22 [place]    Staphylococcus epidermidis NOT DETECTED NOT DETECTED Final   Staphylococcus lugdunensis NOT DETECTED NOT DETECTED Final   Streptococcus species NOT DETECTED NOT DETECTED Final   Streptococcus agalactiae NOT DETECTED NOT DETECTED Final   Streptococcus pneumoniae NOT DETECTED NOT DETECTED Final   Streptococcus pyogenes NOT DETECTED NOT DETECTED Final   A.calcoaceticus-baumannii NOT DETECTED NOT DETECTED Final   Bacteroides fragilis NOT DETECTED NOT DETECTED Final   Enterobacterales NOT DETECTED NOT DETECTED Final   Enterobacter cloacae complex NOT DETECTED NOT DETECTED Final   Escherichia coli NOT DETECTED NOT DETECTED Final   Klebsiella aerogenes NOT DETECTED NOT DETECTED Final   Klebsiella oxytoca NOT DETECTED NOT DETECTED Final   Klebsiella pneumoniae NOT DETECTED NOT DETECTED Final   Proteus  species NOT DETECTED NOT DETECTED Final   Salmonella species NOT DETECTED NOT DETECTED Final   Serratia marcescens NOT DETECTED NOT DETECTED Final   Haemophilus influenzae NOT DETECTED NOT DETECTED Final   Neisseria meningitidis NOT DETECTED NOT DETECTED Final   Pseudomonas aeruginosa NOT DETECTED NOT DETECTED Final   Stenotrophomonas maltophilia NOT DETECTED NOT DETECTED Final   Candida albicans NOT DETECTED NOT DETECTED Final   Candida auris NOT DETECTED NOT DETECTED Final   Candida glabrata NOT DETECTED NOT DETECTED Final   Candida krusei NOT DETECTED NOT DETECTED Final   Candida parapsilosis NOT DETECTED NOT DETECTED Final   Candida tropicalis NOT DETECTED NOT DETECTED Final   Cryptococcus neoformans/gattii NOT DETECTED NOT DETECTED Final   Meth resistant mecA/C and MREJ NOT DETECTED NOT DETECTED Final    Comment: Performed at [facility], [address], [place], [place] [address]  Resp panel by RT-PCR (RSV, Flu A&B, Covid) Anterior Nasal Swab     Status: None   Collection Time: 03/28/22 10:42 AM   Specimen: Anterior Nasal Swab  Result Value Ref Range Status   SARS Coronavirus 2 by RT PCR NEGATIVE NEGATIVE Final    Comment: (NOTE) SARS-CoV-2 target nucleic acids are NOT DETECTED.  The SARS-CoV-2 RNA is generally detectable in upper respiratory specimens during the acute phase of infection. The lowest concentration of SARS-CoV-2 viral copies this assay can detect is 138 copies/mL. A negative result does not preclude SARS-Cov-2 infection and should not be used as the sole basis for treatment or other patient management decisions. A negative result may occur with  improper specimen collection/handling, submission of specimen other than nasopharyngeal swab, presence of viral mutation(s) within the  areas targeted by this assay, and inadequate number of viral copies(<138 copies/mL). A negative result must be combined with clinical observations, patient history, and  epidemiological information. The expected result is Negative.  Fact Sheet for Patients:  EntrepreneurPulse.com.au  Fact Sheet for Healthcare Providers:  IncredibleEmployment.be  This test is no t yet approved or cleared by the [place] FDA and  has been authorized for detection and/or diagnosis of SARS-CoV-2 by FDA under an Emergency Use Authorization (EUA). This EUA will remain  in effect (meaning this test can be used) for the duration of the COVID-19 declaration under Section 564(b)(1) of the Act, 21 U.S.C.section 360bbb-3(b)(1), unless the authorization is terminated  or revoked sooner.       Influenza A by PCR NEGATIVE NEGATIVE Final   Influenza B by PCR NEGATIVE NEGATIVE Final    Comment: (NOTE) The Xpert Xpress SARS-CoV-2/FLU/RSV plus assay is intended as an aid in the diagnosis of influenza from Nasopharyngeal swab specimens and should not be used as a sole basis for treatment. Nasal washings and aspirates are unacceptable for Xpert Xpress SARS-CoV-2/FLU/RSV testing.  Fact Sheet for Patients: EntrepreneurPulse.com.au  Fact Sheet for Healthcare Providers: IncredibleEmployment.be  This test is not yet approved or cleared by the [place] FDA and has been authorized for detection and/or diagnosis of SARS-CoV-2 by FDA under an Emergency Use Authorization (EUA). This EUA will remain in effect (meaning this test can be used) for the duration of the COVID-19 declaration under Section 564(b)(1) of the Act, 21 U.S.C. section 360bbb-3(b)(1), unless the authorization is terminated or revoked.     Resp Syncytial Virus by PCR NEGATIVE NEGATIVE Final    Comment: (NOTE) Fact Sheet for Patients: EntrepreneurPulse.com.au  Fact Sheet for Healthcare Providers: IncredibleEmployment.be  This test is not yet approved or cleared by the [place] FDA and has been  authorized for detection and/or diagnosis of SARS-CoV-2 by FDA under an Emergency Use Authorization (EUA). This EUA will remain in effect (meaning this test can be used) for the duration of the COVID-19 declaration under Section 564(b)(1) of the Act, 21 U.S.C. section 360bbb-3(b)(1), unless the authorization is terminated or revoked.  Performed at [facility], [place] [address], [place], [place] [address]   Blood Culture (routine x 2)     Status: None   Collection Time: 03/28/22  3:00 PM   Specimen: BLOOD  Result Value Ref Range Status   Specimen Description BLOOD SITE NOT SPECIFIED  Final   Special Requests   Final    BOTTLES DRAWN AEROBIC AND ANAEROBIC Blood Culture adequate volume   Culture   Final    NO GROWTH 5 DAYS Performed at [facility], [address], [place], [place] [address]    Report Status 04/02/2022 FINAL  Final  Urine Culture     Status: None   Collection Time: 03/28/22  3:18 PM   Specimen: In/Out Cath Urine  Result Value Ref Range Status   Specimen Description IN/OUT CATH URINE  Final   Special Requests NONE  Final   Culture   Final    NO GROWTH Performed at [facility], [place] [address] [place], [place] [address]    Report Status 03/29/2022 FINAL  Final  MRSA Next Gen by PCR, Nasal     Status: None   Collection Time: 03/29/22  8:31 PM   Specimen: Nasal Mucosa; Nasal Swab  Result Value Ref Range Status   MRSA by PCR Next Gen NOT DETECTED NOT DETECTED Final  Comment: (NOTE) The GeneXpert MRSA Assay (FDA approved for NASAL specimens only), is one component of a comprehensive MRSA colonization surveillance program. It is not intended to diagnose MRSA infection nor to guide or monitor treatment for MRSA infections. Test performance is not FDA approved in patients less than 13 years old. Performed at [facility], [place] [address] [place], [place] [address]   Culture, blood (Routine X 2) w Reflex to ID Panel     Status: None    Collection Time: 03/30/22  3:46 AM   Specimen: BLOOD RIGHT HAND  Result Value Ref Range Status   Specimen Description BLOOD RIGHT HAND  Final   Special Requests   Final    BOTTLES DRAWN AEROBIC AND ANAEROBIC Blood Culture adequate volume   Culture   Final    NO GROWTH 5 DAYS Performed at [facility], [place] [address], [place], [place] [address]    Report Status 04/04/2022 FINAL  Final  Culture, blood (Routine X 2) w Reflex to ID Panel     Status: None   Collection Time: 03/30/22  4:06 AM   Specimen: BLOOD RIGHT HAND  Result Value Ref Range Status   Specimen Description BLOOD RIGHT HAND  Final   Special Requests   Final    BOTTLES DRAWN AEROBIC ONLY Blood Culture adequate volume   Culture   Final    NO GROWTH 5 DAYS Performed at [facility], [place] [address] [place], [place] [address]    Report Status 04/04/2022 FINAL  Final    Studies/Results: No results found.    Assessment/Plan:  INTERVAL HISTORY:  TEE pending   Principal Problem:   Severe sepsis with acute organ dysfunction (HCC) Active Problems:   Hypothyroidism   DM type 2, controlled, with complication (HCC)   HLD (hyperlipidemia)   OBESITY, MORBID   Anxiety disorder   Essential hypertension   Chronic diastolic heart failure (HCC)   Atrial fibrillation and flutter (HCC)   Presence of permanent cardiac pacemaker   Altered mental status   AKI (acute kidney injury) ([place])   DNR (do not resuscitate)   Hypotension    Jessica [name] is a 89 y.o. female with  MSSA bacteremia in setting of TAVR and pacemaker  #1 MSSA bacteremia:  She needs TEE Blood cultures are clearing  Even if TEE negative would need in my book to either have device extracted or have her on lifelong suppressive antibiotics after course of 6 weeks of parenteral abx  I spent 46mnutes with the patient including than 50% of the time in face to face counseling of the patient re her MSSA bactreremia, pacemaker infection,  possible endocaridits, along with review of medical records in preparation for the visit and during the visit and in coordination of her care.     LOS: 7 days   CAlcide Evener1/05/2022, 12:15 PM

## 2022-04-04 NOTE — Progress Notes (Signed)
   04/04/22 1300  Mobility  Activity Transferred from bed to chair  Level of Assistance Moderate assist, patient does 50-74% (+2)  Assistive Device Front wheel walker (HHA)  Distance Ambulated (ft) 2 ft  Activity Response Tolerated well  Mobility Referral Yes  $Mobility charge 1 Mobility   Mobility Specialist Progress Note  Pt requesting to sit in chair. Had no c/o pain throughout. Left in chair w/ all needs met and call bell in reach.   Lucious Groves Mobility Specialist  Please contact via SecureChat or Rehab office at [phone]

## 2022-04-04 NOTE — Progress Notes (Signed)
PROGRESS NOTE  CHANEKA [name]  DOB: [birth year]  PCP: [name], MD IOE:[id number]  DOA: 03/28/2022  LOS: 7 days  Hospital Day: 8  Brief narrative: Jessica [name] is a 89 y.o. female with PMH significant for morbid obesity, DM2, HTN, HLD, A-fib/flutter on Eliquis, second-degree AV block, s/p PPM, chronic diastolic CHF, AS s/p TAVR, history of PE, GERD, hypothyroidism, osteoarthritis, anxiety/depression. 12/26, patient was brought to the ED by EMS from home for altered mental status. Per report, she had not been feeling well for few days, had decreased oral intake, nausea, vomiting and dysuria and essentially bedridden for the last few days.  In the ED, patient had a fever of 101.7, blood pressure low in 90s.  Labs showed WBC count at 13.3, lactic acid elevated to 4.7 CT abdomen pelvis was negative for any acute findings, showed possible atelectasis versus bilateral pneumonia CT head and CTA head and neck were obtained which showed questionable low-density subdural collection over the left cerebral hemisphere along with mild chronic small vessel ischemic changes. EDP discussed with neurosurgery, suspected the imaging findings were due to chronic SDH.  Recommended holding Eliquis and repeating CT head in 48 hours.  Initially admitted to [facility] Because of low blood pressure, she was given IV hydration but was limited due to pulmonary edema and new oxygen requirement.  Blood pressure remained low and hence PCCM was consulted. She was started on Levophed and upgraded to ICU. Blood culture sent on admission grew MSSA ID was consulted. 12/29, transferred out to [facility] See below for details.  Subjective: Patient was seen and examined this morning.   Sitting up in recliner.  Feels better today.  See apologizes for the panic attack yesterday. Family not at bedside.  Pending TEE tomorrow.  Assessment and plan: Septic shock -POA MSSA bacteremia Presented with fever, lethargy,  leukocytosis, lactic acid elevation Blood culture sent on admission grew MSSA.  Source unclear at this time ID following.  Currently on IV Ancef ID recommended TEE for evaluation of TAVR and PPM device.  Also recommended consideration of PPM extraction.  12/21, I called cardiology and requested for TEE.  On schedule for TEE tomorrow 1/3.  N.p.o. after midnight ordered. WBC count, lactic acid level improved. Recent Labs  Lab 03/28/22 1241 03/28/22 1732 03/28/22 1947 03/29/22 0522 03/30/22 0347 03/31/22 1053 04/02/22 0037 04/03/22 0035  WBC  --   --   --  16.2* 11.1* 8.0 7.4 9.8  LATICACIDVEN 3.7*  --  2.9*  --   --  1.2  --   --   PROCALCITON  --  4.41  --  3.38 1.93  --   --   --    Acute exacerbation of chronic diastolic CHF Acute respiratory failure with hypoxia Initially blood pressure low because of shock.  Required pressors.  Blood pressure improved PTA on Toprol 50 mg daily, Lasix 40 mg twice daily, Entresto 97/103 mg twice daily. Medicines were initially held because of septic shock.  Gradually resumed. Patient received aggressive IV hydration during septic shock treatment.  More than 7 L positive fluid balance.  Currently requiring 4 L oxygen by nasal cannula. Start Lasix 40 mg IV twice daily Also continue Lopressor 25 mg twice daily, Entresto 97/103 mg twice daily.  Continue to monitor heart rate and blood pressure. Wean down oxygen as tolerated. Net IO Since Admission: 7,257.82 mL [04/04/22 1045] Continue to monitor for daily intake output, weight, blood pressure, BNP, renal function and electrolytes. Recent Labs  Lab 03/28/22 1113 03/28/22 1123 03/29/22 0522 03/29/22 0830 03/30/22 0347 03/31/22 1053 04/02/22 0037 04/03/22 0035  BNP 774.3*  --   --  529.6* 471.9*  --   --   --   BUN  --    < > 18  --  '13 9 12 8  '$ CREATININE  --    < > 1.10*  --  0.86 0.74 0.77 0.64  K  --    < > 3.4*  --  3.4* 3.9 4.6 3.5  MG  --   --  2.0  --  2.2 2.2  --   --    < > = values  in this interval not displayed.   A-fib/flutter second-degree AV block s/p PPM Continue to monitor in telemetry on metoprolol. Eliquis currently remains on hold.  Currently on full dose Lovenox while awaiting for TEE  Uncontrolled type 2 diabetes mellitus with hyperglycemia A1c 10 on 12/26/2021 PTA on Prandin 4 mg Premeal 3 times daily, metformin 500 mg twice daily. Both resumed.  Also on Semglee at 5 units nightly along with sliding scale insulin. Recent Labs  Lab 04/03/22 1132 04/03/22 1744 04/03/22 2058 04/04/22 0613 04/04/22 1041  GLUCAP 158* 95 139* 127* 478*   AKI Metabolic acidosis Creatinine was elevated to 1.3 on admission.  Improved with hydration.  Serum bicarb level improved as well. Recent Labs    06/22/21 1214 09/22/21 1151 12/26/21 1218 03/28/22 1123 03/29/22 0522 03/30/22 0347 03/31/22 1053 04/02/22 0037 04/03/22 0035  BUN 19 21 24* '17 18 13 9 12 8  '$ CREATININE 0.90 0.90 0.94 1.31* 1.10* 0.86 0.74 0.77 0.64  CO2 '27 26 25 '$ 20* 18* '24 25 24 25   '$ Hypokalemia/hypophosphatemia Electrolytes level improved.  Continue to monitor on diuresis. Recent Labs  Lab 03/29/22 0522 03/30/22 0347 03/30/22 1927 03/31/22 1053 04/02/22 0037 04/03/22 0035  K 3.4* 3.4*  --  3.9 4.6 3.5  MG 2.0 2.2  --  2.2  --   --   PHOS 2.2* 1.5* 1.2* 1.5*  --  3.3   Acute metabolic encephalopathy Secondary to septic shock. Improved.  Presumed chronic SDH Initial CT head finding as above showing subdural collection. Repeat CT head 12/28 without changes.  No need of intervention per neurosurgery.  Hyperlipidemia PTA on Zetia 10 mg daily  Hypothyroidism Synthroid 75 mg daily  Anxiety/depression PTA on Effexor, Restoril, Ativan as needed. Add Percocet as needed today.  Impaired mobility  PT eval obtained.  SNF recommended   Goals of care   Code Status: DNR   Scheduled Meds:  Chlorhexidine Gluconate Cloth  6 each Topical Daily   enoxaparin (LOVENOX) injection  120 mg  Subcutaneous Q12H   ezetimibe  10 mg Oral Daily   furosemide  40 mg Intravenous BID   insulin aspart  0-5 Units Subcutaneous QHS   insulin aspart  0-9 Units Subcutaneous TID WC   insulin glargine-yfgn  5 Units Subcutaneous QHS   levothyroxine  75 mcg Oral Q0600   metFORMIN  500 mg Oral BID WC   metoprolol tartrate  25 mg Oral BID   mouth rinse  15 mL Mouth Rinse 4 times per day   repaglinide  4 mg Oral TID with meals   rosuvastatin  10 mg Oral Q M,W,F-2000   sacubitril-valsartan  1 tablet Oral BID   sodium chloride flush  3 mL Intravenous Q12H   venlafaxine XR  75 mg Oral Q breakfast    PRN meds: docusate sodium, ipratropium-albuterol, loperamide,  LORazepam, ondansetron **OR** ondansetron (ZOFRAN) IV, mouth rinse, oxyCODONE-acetaminophen, polyethylene glycol   Infusions:   sodium chloride Stopped (04/03/22 1058)    ceFAZolin (ANCEF) IV 2 g (04/04/22 2595)    Skin assessment:     Nutritional status:  Body mass index is 47.94 kg/m.          Diet:  Diet Order             Diet NPO time specified  Diet effective midnight           Diet Carb Modified Fluid consistency: Thin; Room service appropriate? Yes  Diet effective ____                   DVT prophylaxis:  Place and maintain sequential compression device Start: 03/29/22 0749 SCDs Start: 03/28/22 1953   Antimicrobials: Ancef IV Fluid: None Consultants: Cardiology, ID Family Communication: None at bedside  Status is: Inpatient  Continue in-hospital care because: Ongoing workup.  Pending TEE tomorrow Level of care: Progressive   Dispo: The patient is from: Home              Anticipated d/c is to: Pending clinical course              Patient currently is not medically stable to d/c.   Difficult to place patient No    Antimicrobials: Anti-infectives (From admission, onward)    Start     Dose/Rate Route Frequency Ordered Stop   03/30/22 1300  vancomycin (VANCOREADY) IVPB 1500 mg/300 mL  Status:   Discontinued        1,500 mg 150 mL/hr over 120 Minutes Intravenous Every 48 hours 03/28/22 1315 03/29/22 0735   03/29/22 1000  ceFAZolin (ANCEF) IVPB 2g/100 mL premix        2 g 200 mL/hr over 30 Minutes Intravenous Every 8 hours 03/29/22 0742     03/29/22 0000  metroNIDAZOLE (FLAGYL) IVPB 500 mg  Status:  Discontinued        500 mg 100 mL/hr over 60 Minutes Intravenous Every 12 hours 03/28/22 1837 03/29/22 0735   03/28/22 2200  ceFEPIme (MAXIPIME) 2 g in sodium chloride 0.9 % 100 mL IVPB  Status:  Discontinued        2 g 200 mL/hr over 30 Minutes Intravenous Every 12 hours 03/28/22 1851 03/29/22 0735   03/28/22 1430  metroNIDAZOLE (FLAGYL) IVPB 500 mg        500 mg 100 mL/hr over 60 Minutes Intravenous  Once 03/28/22 1427 03/28/22 1655   03/28/22 1130  vancomycin (VANCOREADY) IVPB 2000 mg/400 mL        2,000 mg 200 mL/hr over 120 Minutes Intravenous  Once 03/28/22 1129 03/28/22 1457   03/28/22 1045  ceFEPIme (MAXIPIME) 2 g in sodium chloride 0.9 % 100 mL IVPB        2 g 200 mL/hr over 30 Minutes Intravenous  Once 03/28/22 1042 03/28/22 1203       Objective: Vitals:   04/04/22 0800 04/04/22 1043  BP: 135/78 134/67  Pulse: 72 72  Resp: 19 20  Temp:  98.6 F (37 C)  SpO2: 97% 98%    Intake/Output Summary (Last 24 hours) at 04/04/2022 1045 Last data filed at 04/04/2022 0900 Gross per 24 hour  Intake 469.74 ml  Output 1400 ml  Net -930.26 ml   Filed Weights   03/31/22 0432 04/02/22 0102 04/04/22 0400  Weight: 123.5 kg 120.7 kg 118.9 kg   Weight change:  Body mass index  is 47.94 kg/m.   Physical Exam: General exam: Pleasant, elderly, not in pain Skin: No rashes, lesions or ulcers. HEENT: Atraumatic, normocephalic, no obvious bleeding Lungs: Diminished air entry in both bases CVS: Remains slightly tachycardic, no murmur  GI/Abd soft, nontender, nondistended, bowel sound present CNS: Alert, awake, oriented to place and person Psychiatry: Sad affect. Extremities: No  pedal edema, no calf tenderness  Data Review: I have personally reviewed the laboratory data and studies available.  F/u labs ordered Unresulted Labs (From admission, onward)     Start     Ordered   04/05/22 0500  CBC with Differential/Platelet  Tomorrow morning,   R       Question:  Specimen collection method  Answer:  Lab=Lab collect   04/04/22 0805   04/05/22 2426  Basic metabolic panel  Tomorrow morning,   R       Question:  Specimen collection method  Answer:  Lab=Lab collect   04/04/22 0805            Total time spent in review of labs and imaging, patient evaluation, formulation of plan, documentation and communication with family 89 minutes  Signed, Terrilee Croak, MD [facility] 04/04/2022

## 2022-04-04 NOTE — Progress Notes (Signed)
Physical Therapy Treatment Patient Details Name: Jessica [name] MRN: [id number] DOB: [birth year] Today's Date: 04/04/2022   History of Present Illness 89 yo female presents to [facility] on 12/26 with AMS, decreased intake. CT abd/pelvis shows possible atelectasis vs basilar PNA. CT and CTA head/neck which demonstrated questionable low density subdural collection over the left cerebral hemisphere along with mild chronic small vessel ischemic changes (presumed chronic). Workup for MSSA bacteremia, sepsis. PMH includes chronic diastolic heart failure, 2nd degree AV block-s/p pacemaker implantation, atrial fibrillation on Eliquis, hypertension, hyperlipidemia, chronic kidney disease, thyroid disease, TAVR, and depression.    PT Comments    Pt was assisted to chair by MT and nursing at which point PT stepped in to help pt with there exercises, then to stand and attempt gait.  Pt was quickly assisted to [facility] and then back to chair with fatigue from the effort.  Follow along with her for mobility and control of standing balance to restore gait as she awaits transition to rehab setting.  SNF recommended due to depth of deficits, the time needed to recover and her willingness to work toward more independent function.  Follow up with all goals of acute PT on POC.   Recommendations for follow up therapy are one component of a multi-disciplinary discharge planning process, led by the attending physician.  Recommendations may be updated based on patient status, additional functional criteria and insurance authorization.  Follow Up Recommendations  Skilled nursing-short term rehab (<3 hours/day) Can patient physically be transported by private vehicle: No   Assistance Recommended at Discharge Frequent or constant Supervision/Assistance  Patient can return home with the following Two people to help with walking and/or transfers;Two people to help with bathing/dressing/bathroom   Equipment Recommendations  None  recommended by PT    Recommendations for Other Services       Precautions / Restrictions Precautions Precautions: Fall Precaution Comments: watch O2 Restrictions Weight Bearing Restrictions: No     Mobility  Bed Mobility Overal bed mobility: Needs Assistance             General bed mobility comments: up in chair when PT arrived    Transfers Overall transfer level: Needs assistance Equipment used: Rolling walker (2 wheels), 1 person hand held assist Transfers: Sit to/from Stand, Bed to chair/wheelchair/BSC Sit to Stand: Mod assist   Step pivot transfers: Mod assist, +2 safety/equipment, +2 physical assistance       General transfer comment: 2 mod for stepping and to encourage complete turning to sit    Ambulation/Gait               General Gait Details: attempted but deferred as pt needed to get to [place]             Wheelchair Mobility    Modified Rankin (Stroke Patients Only)       Balance Overall balance assessment: Needs assistance Sitting-balance support: Feet supported, No upper extremity supported Sitting balance-Leahy Scale: Fair     Standing balance support: Bilateral upper extremity supported, During functional activity Standing balance-Leahy Scale: Poor                              Cognition Arousal/Alertness: Awake/alert Behavior During Therapy: WFL for tasks assessed/performed Overall Cognitive Status: Impaired/Different from baseline Area of Impairment: Problem solving, Awareness, Memory, Following commands, Safety/judgement, Attention, Orientation  Orientation Level: Situation Current Attention Level: Selective Memory: Decreased recall of precautions, Decreased short-term memory Following Commands: Follows one step commands with increased time Safety/Judgement: Decreased awareness of safety, Decreased awareness of deficits Awareness: Intellectual Problem Solving: Slow  processing, Requires verbal cues, Requires tactile cues General Comments: pt is following directions with time, a bit impulsive at times        Exercises General Exercises - Lower Extremity Ankle Circles/Pumps: AROM, AAROM, 5 reps Quad Sets: AROM, 10 reps Long Arc Quad: Strengthening, 10 reps Heel Slides: Strengthening, 10 reps Hip ABduction/ADduction: AROM, 10 reps    General Comments General comments (skin integrity, edema, etc.): Pt was assisted to get to [facility] after standing to walk, was able to make the transitions despite some fatigue from first effort to chair      Pertinent Vitals/Pain Pain Assessment Pain Assessment: Faces Faces Pain Scale: Hurts a little bit Pain Location: L knee generally that was struck in a fall Pain Descriptors / Indicators: Guarding Pain Intervention(s): Monitored during session, Repositioned    Home Living                          Prior Function            PT Goals (current goals can now be found in the care plan section) Acute Rehab PT Goals Patient Stated Goal: get stronger Progress towards PT goals: Progressing toward goals    Frequency    Min 2X/week      PT Plan Current plan remains appropriate    Co-evaluation              AM-PAC PT "6 Clicks" Mobility   Outcome Measure  Help needed turning from your back to your side while in a flat bed without using bedrails?: A Lot Help needed moving from lying on your back to sitting on the side of a flat bed without using bedrails?: A Lot Help needed moving to and from a bed to a chair (including a wheelchair)?: A Lot Help needed standing up from a chair using your arms (e.g., wheelchair or bedside chair)?: A Lot Help needed to walk in hospital room?: A Lot Help needed climbing 3-5 steps with a railing? : Total 6 Click Score: 11    End of Session Equipment Utilized During Treatment: Gait belt Activity Tolerance: Patient limited by fatigue Patient left: in bed;with  call bell/phone within reach;with bed alarm set;with nursing/sitter in room Nurse Communication: Mobility status PT Visit Diagnosis: Muscle weakness (generalized) (M62.81);Other abnormalities of gait and mobility (R26.89)     Time: 9798-9211 PT Time Calculation (min) (ACUTE ONLY): 40 min  Charges:  $Therapeutic Exercise: 8-22 mins $Therapeutic Activity: 23-37 mins    Ramond Dial 04/04/2022, 2:53 PM  Mee Hives, PT PhD Acute Rehab Dept. Number: [place] [facility] [place]

## 2022-04-05 ENCOUNTER — Inpatient Hospital Stay (HOSPITAL_COMMUNITY): Payer: Medicare Other | Admitting: Anesthesiology

## 2022-04-05 ENCOUNTER — Inpatient Hospital Stay (HOSPITAL_COMMUNITY): Payer: Medicare Other

## 2022-04-05 ENCOUNTER — Encounter (HOSPITAL_COMMUNITY): Payer: Self-pay | Admitting: Pulmonary Disease

## 2022-04-05 ENCOUNTER — Encounter (HOSPITAL_COMMUNITY)
Admission: EM | Disposition: A | Discharge status: Skilled Nursing Facility | Payer: Self-pay | Source: Home / Self Care | Attending: Internal Medicine

## 2022-04-05 ENCOUNTER — Other Ambulatory Visit (HOSPITAL_COMMUNITY): Payer: Medicare Other

## 2022-04-05 DIAGNOSIS — I38 Endocarditis, valve unspecified: Secondary | ICD-10-CM

## 2022-04-05 DIAGNOSIS — T826XXA Infection and inflammatory reaction due to cardiac valve prosthesis, initial encounter: Secondary | ICD-10-CM

## 2022-04-05 DIAGNOSIS — R7881 Bacteremia: Secondary | ICD-10-CM

## 2022-04-05 DIAGNOSIS — I330 Acute and subacute infective endocarditis: Secondary | ICD-10-CM

## 2022-04-05 DIAGNOSIS — I4891 Unspecified atrial fibrillation: Secondary | ICD-10-CM

## 2022-04-05 DIAGNOSIS — Z954 Presence of other heart-valve replacement: Secondary | ICD-10-CM

## 2022-04-05 DIAGNOSIS — I10 Essential (primary) hypertension: Secondary | ICD-10-CM

## 2022-04-05 DIAGNOSIS — E119 Type 2 diabetes mellitus without complications: Secondary | ICD-10-CM

## 2022-04-05 DIAGNOSIS — J90 Pleural effusion, not elsewhere classified: Secondary | ICD-10-CM

## 2022-04-05 DIAGNOSIS — B9561 Methicillin susceptible Staphylococcus aureus infection as the cause of diseases classified elsewhere: Secondary | ICD-10-CM | POA: Diagnosis not present

## 2022-04-05 HISTORY — PX: TEE WITHOUT CARDIOVERSION: SHX5443

## 2022-04-05 LAB — CBC WITH DIFFERENTIAL/PLATELET
Abs Immature Granulocytes: 0.28 10*3/uL — ABNORMAL HIGH (ref 0.00–0.07)
Basophils Absolute: 0 10*3/uL (ref 0.0–0.1)
Basophils Relative: 0 %
Eosinophils Absolute: 0 10*3/uL (ref 0.0–0.5)
Eosinophils Relative: 0 %
HCT: 31.4 % — ABNORMAL LOW (ref 36.0–46.0)
Hemoglobin: 10 g/dL — ABNORMAL LOW (ref 12.0–15.0)
Immature Granulocytes: 4 %
Lymphocytes Relative: 17 %
Lymphs Abs: 1.3 10*3/uL (ref 0.7–4.0)
MCH: 30.4 pg (ref 26.0–34.0)
MCHC: 31.8 g/dL (ref 30.0–36.0)
MCV: 95.4 fL (ref 80.0–100.0)
Monocytes Absolute: 1.2 10*3/uL — ABNORMAL HIGH (ref 0.1–1.0)
Monocytes Relative: 16 %
Neutro Abs: 4.8 10*3/uL (ref 1.7–7.7)
Neutrophils Relative %: 63 %
Platelets: 241 10*3/uL (ref 150–400)
RBC: 3.29 MIL/uL — ABNORMAL LOW (ref 3.87–5.11)
RDW: 14.1 % (ref 11.5–15.5)
WBC: 7.6 10*3/uL (ref 4.0–10.5)
nRBC: 0 % (ref 0.0–0.2)

## 2022-04-05 LAB — BASIC METABOLIC PANEL
Anion gap: 11 (ref 5–15)
BUN: 7 mg/dL — ABNORMAL LOW (ref 8–23)
CO2: 27 mmol/L (ref 22–32)
Calcium: 8.5 mg/dL — ABNORMAL LOW (ref 8.9–10.3)
Chloride: 98 mmol/L (ref 98–111)
Creatinine, Ser: 0.7 mg/dL (ref 0.44–1.00)
GFR, Estimated: >60 mL/min (ref >60)
Glucose, Bld: 108 mg/dL — ABNORMAL HIGH (ref 70–99)
Potassium: 3.1 mmol/L — ABNORMAL LOW (ref 3.5–5.1)
Sodium: 136 mmol/L (ref 135–145)

## 2022-04-05 LAB — GLUCOSE, CAPILLARY
Glucose-Capillary: 110 mg/dL — ABNORMAL HIGH (ref 70–99)
Glucose-Capillary: 109 mg/dL — ABNORMAL HIGH (ref 70–99)
Glucose-Capillary: 104 mg/dL — ABNORMAL HIGH (ref 70–99)
Glucose-Capillary: 107 mg/dL — ABNORMAL HIGH (ref 70–99)
Glucose-Capillary: 128 mg/dL — ABNORMAL HIGH (ref 70–99)

## 2022-04-05 SURGERY — ECHOCARDIOGRAM, TRANSESOPHAGEAL
Anesthesia: Monitor Anesthesia Care

## 2022-04-05 MED ORDER — POTASSIUM CHLORIDE CRYS ER 20 MEQ PO TBCR
40.0000 meq | EXTENDED_RELEASE_TABLET | Freq: Once | ORAL | Status: AC
  Administered 2022-04-05: 40 meq via ORAL
  Filled 2022-04-05: qty 2

## 2022-04-05 MED ORDER — LIDOCAINE 2% (20 MG/ML) 5 ML SYRINGE
INTRAMUSCULAR | Status: DC | PRN
  Administered 2022-04-05: 40 mg via INTRAVENOUS

## 2022-04-05 MED ORDER — SODIUM CHLORIDE 0.9 % IV SOLN: INTRAVENOUS | Status: DC

## 2022-04-05 MED ORDER — PHENYLEPHRINE HCL (PRESSORS) 10 MG/ML IV SOLN
INTRAVENOUS | Status: DC | PRN
  Administered 2022-04-05 (×2): 120 ug via INTRAVENOUS

## 2022-04-05 MED ORDER — PROPOFOL 500 MG/50ML IV EMUL
INTRAVENOUS | Status: DC | PRN
  Administered 2022-04-05: 10 mg via INTRAVENOUS
  Administered 2022-04-05: 100 ug/kg/min via INTRAVENOUS
  Administered 2022-04-05: 50 ug/kg/min via INTRAVENOUS

## 2022-04-05 NOTE — Anesthesia Preprocedure Evaluation (Addendum)
Anesthesia Evaluation  Patient identified by MRN, date of birth, ID band Patient awake    Reviewed: Allergy & Precautions, NPO status , Patient's Chart, lab work & pertinent test results  Airway Mallampati: III       Dental  (+) Upper Dentures, Lower Dentures   Pulmonary     + decreased breath sounds      Cardiovascular hypertension, + dysrhythmias Atrial Fibrillation + pacemaker + Valvular Problems/Murmurs AS  Rhythm:Regular Rate:Normal  Echo: 1. The aortic valve has been repaired/replaced. There is a 26 mm Sapien  prosthetic (TAVR) valve present in the aortic position. Procedure Date:  01/14/19. Aortic valve mean gradient measures 15.0 mmHg. Aortic valve Vmax  measures 2.61 m/s. EOA 1.9cm2, DI  0.5. There is mild paravalvular leak located at the 2 o'clock position on  PSAX view.   2. Left ventricular ejection fraction, by estimation, is 55 to 60%. The  left ventricle has normal function. Septal motion is paradoxical in the  setting of RV pacing. There is moderate asymmetric left ventricular  hypertrophy of the basal-septal segment.  Left ventricular diastolic parameters are consistent with Grade II  diastolic dysfunction (pseudonormalization).   3. Right ventricular systolic function is normal. The right ventricular  size is normal. There is normal pulmonary artery systolic pressure. The  estimated right ventricular systolic pressure is 23.5 mmHg.   4. Left atrial size was mildly dilated.   5. The mitral valve is degenerative. Trivial mitral valve regurgitation.  Moderate mitral annular calcification.   6. Aortic dilatation noted. There is borderline dilatation of the  ascending aorta, measuring 37 mm.   7. The inferior vena cava is dilated in size with >50% respiratory  variability, suggesting right atrial pressure of 8 mmHg.   - s/p TAVR    Neuro/Psych  PSYCHIATRIC DISORDERS Anxiety Depression       GI/Hepatic Neg  liver ROS, hiatal hernia,GERD  ,,  Endo/Other  diabetesHypothyroidism    Renal/GU Renal disease     Musculoskeletal  (+) Arthritis ,    Abdominal   Peds  Hematology negative hematology ROS (+)   Anesthesia Other Findings   Reproductive/Obstetrics                             Anesthesia Physical Anesthesia Plan  ASA: 3  Anesthesia Plan: MAC   Post-op Pain Management: Minimal or no pain anticipated   Induction: Intravenous  PONV Risk Score and Plan: 0 and Propofol infusion  Airway Management Planned: Natural Airway and Nasal Cannula  Additional Equipment: None  Intra-op Plan:   Post-operative Plan:   Informed Consent: I have reviewed the patients History and Physical, chart, labs and discussed the procedure including the risks, benefits and alternatives for the proposed anesthesia with the patient or authorized representative who has indicated his/her understanding and acceptance.   Patient has DNR.     Plan Discussed with: CRNA  Anesthesia Plan Comments:        Anesthesia Quick Evaluation

## 2022-04-05 NOTE — Progress Notes (Signed)
OT Cancellation Note  Patient Details Name: Jessica [name] MRN: [id number] DOB: [birth year]   Cancelled Treatment:    Reason Eval/Treat Not Completed: Patient at procedure or test/ unavailable- off unit for TEE. Will follow and see as able.   Jolaine Artist, OT Acute Rehabilitation Services Office [phone]   [name] 04/05/2022, 10:51 AM

## 2022-04-05 NOTE — H&P (View-Only) (Signed)
   [place] has been requested to perform a transesophageal echocardiogram on Jessica [name] for bacteremia.  After careful review of history and examination, the risks and benefits of transesophageal echocardiogram have been explained including risks of esophageal damage, perforation (1:10,000 risk), bleeding, pharyngeal hematoma as well as other potential complications associated with anesthesia including aspiration, arrhythmia, respiratory failure and death. Alternatives to treatment were discussed, questions were answered. Patient is willing to proceed.   89 years old patient with PMH of nonobstructive CAD, HTN, HLD, DM II, afib s/p AV nodal ablation 2019, h/o CHB s/p PPM, severe AS s/p TAVR and chronic systolic and diastolic CHF presented with sepsis. Need to rule out bacterial endocarditis, infection of pacemaker lead and TAVR valve. Vital sign stable. Alert and oriented this morning. No sign of significant anemia or thrombocytopenia. Pending TEE today at 11:30AM by Dr. Phineas Inches.  [place], [place] 04/05/2022 7:37 AM

## 2022-04-05 NOTE — Transfer of Care (Signed)
Immediate Anesthesia Transfer of Care Note  Patient: Jessica [name]  Procedure(s) Performed: TRANSESOPHAGEAL ECHOCARDIOGRAM (TEE)  Patient Location: Endoscopy Unit  Anesthesia Type:MAC  Level of Consciousness: drowsy  Airway & Oxygen Therapy: Patient Spontanous Breathing and Patient connected to face mask oxygen  Post-op Assessment: Report given to RN and Post -op Vital signs reviewed and stable  Post vital signs: Reviewed and stable  Last Vitals:  Vitals Value Taken Time  BP 113/58 04/05/22 1120  Temp    Pulse 88 04/05/22 1124  Resp 30 04/05/22 1124  SpO2 93 % 04/05/22 1124  Vitals shown include unvalidated device data.  Last Pain:  Vitals:   04/05/22 1018  TempSrc: Temporal  PainSc: 0-No pain         Complications: No notable events documented.

## 2022-04-05 NOTE — Progress Notes (Signed)
  Discussed with Dr. Lovena Le.   EP is aware of the patient and TEE results.    No lead vegetation but + large TAVR vegetation.   If pt is candidate for valve intervention, would then consider pacer extraction as well, which would require temporary permanent pacing as she has had an AV nodal ablation and is dependent.   If she is not a surgical candidate, removal of her pacemaker would not provide benefit in regards to her infection and would therefor recommend lifelong antibiotic suppression.    Legrand Como [address]" [place], PA-C  04/05/2022 2:44 PM

## 2022-04-05 NOTE — Anesthesia Postprocedure Evaluation (Signed)
Anesthesia Post Note  Patient: Jessica [name]  Procedure(s) Performed: TRANSESOPHAGEAL ECHOCARDIOGRAM (TEE)     Patient location during evaluation: PACU Anesthesia Type: MAC Level of consciousness: awake and alert Pain management: pain level controlled Vital Signs Assessment: post-procedure vital signs reviewed and stable Respiratory status: spontaneous breathing, nonlabored ventilation, respiratory function stable and patient connected to nasal cannula oxygen Cardiovascular status: stable and blood pressure returned to baseline Postop Assessment: no apparent nausea or vomiting Anesthetic complications: no   No notable events documented.  Last Vitals:  Vitals:   04/05/22 1143 04/05/22 1203  BP: (!) 108/54 (!) 115/59  Pulse: 78 77  Resp: (!) 21 20  Temp:  36.5 C  SpO2: 94% 95%    Last Pain:  Vitals:   04/05/22 1203  TempSrc: Oral  PainSc:                  Effie Berkshire

## 2022-04-05 NOTE — Plan of Care (Signed)
  Problem: Fluid Volume: Goal: Hemodynamic stability will improve Outcome: Progressing   Problem: Clinical Measurements: Goal: Diagnostic test results will improve Outcome: Progressing Goal: Signs and symptoms of infection will decrease Outcome: Progressing   Problem: Respiratory: Goal: Ability to maintain adequate ventilation will improve Outcome: Progressing   Problem: Education: Goal: Knowledge of General Education information will improve Description: Including pain rating scale, medication(s)/side effects and non-pharmacologic comfort measures Outcome: Progressing   Problem: Health Behavior/Discharge Planning: Goal: Ability to manage health-related needs will improve Outcome: Progressing   Problem: Clinical Measurements: Goal: Ability to maintain clinical measurements within normal limits will improve Outcome: Progressing Goal: Respiratory complications will improve Outcome: Progressing   Problem: Nutrition: Goal: Adequate nutrition will be maintained Outcome: Progressing   Problem: Coping: Goal: Level of anxiety will decrease Outcome: Progressing   Problem: Elimination: Goal: Will not experience complications related to bowel motility Outcome: Progressing Goal: Will not experience complications related to urinary retention Outcome: Progressing   Problem: Pain Managment: Goal: General experience of comfort will improve Outcome: Progressing   Problem: Clinical Measurements: Goal: Will remain free from infection Outcome: Not Progressing Goal: Diagnostic test results will improve Outcome: Not Progressing Goal: Cardiovascular complication will be avoided Outcome: Not Progressing   Problem: Activity: Goal: Risk for activity intolerance will decrease Outcome: Not Progressing

## 2022-04-05 NOTE — Consult Note (Signed)
[address]       [place] [address]             [phone]           Jessica [name] [place] Medical Record #[id number] Date of Birth: January 25, 1934  No ref. provider found Plotnikov, Evie Lacks, MD  Chief Complaint:    Chief Complaint  Patient presents with   Altered Mental Status   Emesis    History of Present Illness:     89 yo female sp TAVR 2020 (sapien valve). She was admitted after xmas with confusion and sepsis. Blood cultures with MSSA. TEE today with evidence of vegetation (3cm) with well functioning valve with small PVL. Pt with normal LV function. BC have not been repeated since admission. WBC normal. CR normalized. Pt has been having CHF with need for oxygen. Pt had CT of head with evidence of chronic SDH. Has had her NOAC stopped.      Past Medical History:  Diagnosis Date   Anxiety    Atrial fibrillation and flutter ([place])    detected by PPM interrogation (mostly atrial flutter)   AV block, 2nd degree 10/2012   s/p MDT ADDRL1 pacemaker implantation 10/10/2012 by [name]   Chronic diastolic heart failure ([place])    Depression    GERD (gastroesophageal reflux disease)    HH (hiatus hernia)    History of pulmonary embolism 10/2008   Bilateral   Hyperlipidemia    Hypertension    Hypothyroidism    Obesity    Osteoarthritis    Peripheral neuropathy    Right leg   Presence of permanent cardiac pacemaker    Pulmonary nodule    in the lingula. Noted on pre TAVR CT. Will require follow up   Renal insufficiency 2010   S/P TAVR (transcatheter aortic valve replacement)    Shingles 09/17/2014   right lower quadrant   Type II or unspecified type diabetes mellitus without mention of complication, not stated as uncontrolled 2009    Past Surgical History:  Procedure Laterality Date   APPENDECTOMY     AV NODE ABLATION N/A 07/05/2017   Procedure: AV NODE ABLATION;  Surgeon: [name], MD;  Location: [facility];  Service:  Cardiovascular;  Laterality: N/A;   CARPAL TUNNEL RELEASE     CATARACT EXTRACTION     CHOLECYSTECTOMY     COLONOSCOPY  2010   COLONOSCOPY WITH PROPOFOL N/A 07/25/2021   Procedure: COLONOSCOPY WITH PROPOFOL;  Surgeon: Mauri Pole, MD;  Location: [facility];  Service: Gastroenterology;  Laterality: N/A;   FOREARM FRACTURE SURGERY  09/17/2008   HERNIA REPAIR     INGUINAL HERNIA REPAIR     PACEMAKER INSERTION  10/10/2012   MDT ADDRL1 implanted for 2nd degree AV block by [name]   PERMANENT PACEMAKER INSERTION N/A 10/10/2012   Procedure: PERMANENT PACEMAKER INSERTION;  Surgeon: [name], MD;  Location: [facility];  Service: Cardiovascular;  Laterality: N/A;   RIGHT/LEFT HEART CATH AND CORONARY ANGIOGRAPHY N/A 12/30/2018   Procedure: RIGHT/LEFT HEART CATH AND CORONARY ANGIOGRAPHY;  Surgeon: [name], MD;  Location: [facility];  Service: Cardiovascular;  Laterality: N/A;   ROTATOR CUFF REPAIR     TEE WITHOUT CARDIOVERSION N/A 01/14/2019   Procedure: TRANSESOPHAGEAL ECHOCARDIOGRAM (TEE);  Surgeon: [name], MD;  Location: [facility];  Service: Open Heart Surgery;  Laterality: N/A;   TRANSCATHETER AORTIC VALVE REPLACEMENT, TRANSFEMORAL N/A 01/14/2019  Procedure: TRANSCATHETER AORTIC VALVE REPLACEMENT, TRANSFEMORAL;  Surgeon: [name], MD;  Location: [facility];  Service: Open Heart Surgery;  Laterality: N/A;    Social History   Tobacco Use  Smoking Status Never  Smokeless Tobacco Never    Social History   Substance and Sexual Activity  Alcohol Use No   Alcohol/week: 0.0 standard drinks of alcohol    Social History   Socioeconomic History   Marital status: Widowed    Spouse name: Not on file   Number of children: 3   Years of education: Not on file   Highest education level: Not on file  Occupational History   Not on file  Tobacco Use   Smoking status: Never   Smokeless tobacco: Never  Vaping  Use   Vaping Use: Never used  Substance and Sexual Activity   Alcohol use: No    Alcohol/week: 0.0 standard drinks of alcohol   Drug use: No   Sexual activity: Not Currently  Other Topics Concern   Not on file  Social History Narrative   Opth - Dr Leonie Man   Retired, Looking after great-grand baby; lives w/son   Daily Caffeine Use - 1   Widow - suicide Aug 26, 2008; 2 daughters died      lost brother and son 05/18/2012; spouse died 05-18-2008   Have 4 children; 3 have expired,  now has one; dtr; copes by reading    Father had stroke at 79 and mother had heart disease; MI at 55  but lived to be 77.    Son died quickly of lung cancer   [place]; the oldest had aneurysm   Youngest dtr 44 and died of MI   Twin sister dx with Alzheimer's today/    Will give information regarding Alz resources   Social Determinants of Health   Financial Resource Strain: Low Risk  (07/01/2021)   Overall Financial Resource Strain (CARDIA)    Difficulty of Paying Living Expenses: Not hard at all  Food Insecurity: No Food Insecurity (03/30/2022)   Hunger Vital Sign    Worried About Running Out of Food in the Last Year: Never true    Ran Out of Food in the Last Year: Never true  Transportation Needs: No Transportation Needs (03/30/2022)   PRAPARE - Hydrologist (Medical): No    Lack of Transportation (Non-Medical): No  Physical Activity: Inactive (07/01/2021)   Exercise Vital Sign    Days of Exercise per Week: 0 days    Minutes of Exercise per Session: 0 min  Stress: No Stress Concern Present (07/01/2021)   [place]    Feeling of Stress : Not at all  Social Connections: Socially Isolated (07/01/2021)   Social Connection and Isolation Panel [NHANES]    Frequency of Communication with Friends and Family: Twice a week    Frequency of Social Gatherings with Friends and Family: Twice a week    Attends Religious Services: Never     Marine scientist or Organizations: No    Attends Archivist Meetings: Never    Marital Status: Widowed  Intimate Partner Violence: Not At Risk (03/30/2022)   Humiliation, Afraid, Rape, and Kick questionnaire    Fear of Current or Ex-Partner: No    Emotionally Abused: No    Physically Abused: No    Sexually Abused: No    Allergies  Allergen Reactions   Coreg [Carvedilol] Other (See Comments)  Weak legs   Relafen [Nabumetone] Other (See Comments)    Upset stomach   Calcium Other (See Comments)    Unknown reaction   Codeine Other (See Comments)    Unknown reaction   Lipitor [Atorvastatin] Other (See Comments)    Myalgias   Pacerone [Amiodarone] Other (See Comments)    Hand tremors   Vasotec [Enalapril] Cough   Vioxx [Rofecoxib] Other (See Comments)    Unknown reaction   Zocor [Simvastatin] Other (See Comments)    Myalgias    Current Facility-Administered Medications  Medication Dose Route Frequency Provider Last Rate Last Admin   0.9 %  sodium chloride infusion  250 mL Intravenous Continuous Nevada Crane M, PA-C   Stopped at 04/03/22 1058   ceFAZolin (ANCEF) IVPB 2g/100 mL premix  2 g Intravenous Q8H Bell, Lorin C, RPH 200 mL/hr at 04/05/22 1328 2 g at 04/05/22 1328   docusate sodium (COLACE) capsule 100 mg  100 mg Oral BID PRN Nevada Crane M, PA-C       enoxaparin (LOVENOX) injection 120 mg  120 mg Subcutaneous Q12H Einar Grad, RPH   120 mg at 04/05/22 0444   ezetimibe (ZETIA) tablet 10 mg  10 mg Oral Daily Dahal, Binaya, MD   10 mg at 04/05/22 1330   furosemide (LASIX) injection 40 mg  40 mg Intravenous BID Dahal, Marlowe Aschoff, MD   40 mg at 04/05/22 1330   insulin aspart (novoLOG) injection 0-5 Units  0-5 Units Subcutaneous QHS Dahal, Marlowe Aschoff, MD   2 Units at 04/01/22 2101   insulin aspart (novoLOG) injection 0-9 Units  0-9 Units Subcutaneous TID WC Dahal, Marlowe Aschoff, MD   1 Units at 04/04/22 1644   insulin glargine-yfgn (SEMGLEE) injection 5 Units  5  Units Subcutaneous QHS Dahal, Marlowe Aschoff, MD   5 Units at 04/04/22 2127   ipratropium-albuterol (DUONEB) 0.5-2.5 (3) MG/3ML nebulizer solution 3 mL  3 mL Nebulization Q6H PRN Dahal, Marlowe Aschoff, MD   3 mL at 04/03/22 1056   levothyroxine (SYNTHROID) tablet 75 mcg  75 mcg Oral Q0600 Karmen Bongo, MD   75 mcg at 04/05/22 2993   loperamide (IMODIUM) capsule 2 mg  2 mg Oral PRN Dahal, Marlowe Aschoff, MD       LORazepam (ATIVAN) tablet 0.5 mg  0.5 mg Oral BID PRN Irene Pap N, DO   0.5 mg at 04/04/22 2127   metFORMIN (GLUCOPHAGE) tablet 500 mg  500 mg Oral BID WC Dahal, Marlowe Aschoff, MD   500 mg at 04/04/22 1644   metoprolol tartrate (LOPRESSOR) tablet 25 mg  25 mg Oral BID Dahal, Marlowe Aschoff, MD   25 mg at 04/05/22 1329   ondansetron (ZOFRAN) tablet 4 mg  4 mg Oral Q6H PRN Karmen Bongo, MD       Or   ondansetron [facility]) injection 4 mg  4 mg Intravenous Q6H PRN Karmen Bongo, MD   4 mg at 04/02/22 7169   Oral care mouth rinse  15 mL Mouth Rinse 4 times per day Simonne Maffucci B, MD   15 mL at 04/05/22 1330   Oral care mouth rinse  15 mL Mouth Rinse PRN Juanito Doom, MD       oxyCODONE-acetaminophen (PERCOCET/ROXICET) 5-325 MG per tablet 1 tablet  1 tablet Oral Q6H PRN Terrilee Croak, MD   1 tablet at 04/04/22 2127   polyethylene glycol (MIRALAX / GLYCOLAX) packet 17 g  17 g Oral Daily PRN Nevada Crane M, PA-C       repaglinide (PRANDIN) tablet 4 mg  4 mg Oral TID with meals Dahal, Marlowe Aschoff, MD   4 mg at 04/05/22 1329   rosuvastatin (CRESTOR) tablet 10 mg  10 mg Oral Q M,W,F-2000 Dahal, Marlowe Aschoff, MD   10 mg at 04/03/22 2020   sacubitril-valsartan (ENTRESTO) 97-103 mg per tablet  1 tablet Oral BID Terrilee Croak, MD   1 tablet at 04/05/22 1331   sodium chloride flush (NS) 0.9 % injection 3 mL  3 mL Intravenous Q12H Karmen Bongo, MD   3 mL at 04/05/22 1330   venlafaxine XR (EFFEXOR-XR) 24 hr capsule 75 mg  75 mg Oral Q breakfast Karmen Bongo, MD   75 mg at 04/05/22 0840     Family History  Problem Relation  Age of Onset   Stroke Brother 68   Coronary artery disease Mother    Heart disease Mother 53   Stroke Father 56   Multiple myeloma Brother    Heart attack Son 3       Died of MI   Heart attack Daughter 69       Died of MI   Aneurysm Daughter 32       Question cerebral   Heart disease Maternal Grandmother    Heart disease Maternal Grandfather    Peripheral vascular disease Daughter        Amputation secondary to DM       Physical Exam: BP (!) 115/59 (BP Location: Right Arm)   Pulse 77   Temp 97.7 F (36.5 C) (Oral)   Resp 20   Ht _0  (1.575 m)   Wt 119.3 kg   SpO2 95%   BMI 48.10 kg/m  Lungs: decreased bs at bases Card: Irr with soft murmur Ext: warm Neuro: alert and oriented no focal deficits    Diagnostic Studies & Laboratory data: I have personally reviewed the following studies and agree with the findings     Recent Radiology Findings:   ECHO TEE  Result Date: 04/05/2022    TRANSESOPHOGEAL ECHO REPORT   Patient Name:   LAURALYE KINN Date of Exam: 04/05/2022 Medical Rec #:  [id number]          Height:       62.0 in Accession #:    [id number]         Weight:       263.0 lb Date of Birth:  07-10-1933          BSA:          2.148 m Patient Age:    81 years           BP:           110/53 mmHg Patient Gender: F                  HR:           92 bpm. Exam Location:  Inpatient Procedure: 3D Echo, Transesophageal Echo, Cardiac Doppler and Color Doppler Indications:     Endocarditis  History:         Patient has prior history of Echocardiogram examinations, most                  recent 03/29/2022. Pacemaker and Abnormal ECG, Aortic Valve                  Disease, Arrythmias:Atrial Fibrillation,                  Signs/Symptoms:Bacteremia, Altered Mental Status, Dyspnea and  Shortness of Breath; Risk Factors:Hypertension, Diabetes and                  Dyslipidemia. Aortic stenosis.                  Aortic Valve: 26 mm Sapien prosthetic, stented (TAVR) valve is                   present in the aortic position. Procedure Date: 01/14/2019.  Sonographer:     Roseanna Rainbow RDCS Referring Phys:  [id number] HAO MENG Diagnosing Phys: [place] PROCEDURE: After discussion of the risks and benefits of a TEE, an informed consent was obtained from the patient. The transesophogeal probe was passed without difficulty through the esophogus of the patient. Imaged were obtained with the patient in a left lateral decubitus position. Sedation performed by different physician. The patient was monitored while under deep sedation. Anesthestetic sedation was provided intravenously by Anesthesiology: 264m of Propofol, 1043mof Lidocaine. The patient's vital signs; including heart rate, blood pressure, and oxygen saturation; remained stable throughout the procedure. The patient developed no complications during the procedure.  IMPRESSIONS  1. Left ventricular ejection fraction, by estimation, is 55 to 60%. The left ventricle has normal function.  2. No vegetation seen on the device lead. Right ventricular systolic function is normal. The right ventricular size is normal.  3. No left atrial/left atrial appendage thrombus was detected.  4. Moderate pleural effusion.  5. The mitral valve is degenerative. Trivial mitral valve regurgitation. Moderate mitral annular calcification.  6. 26 mm Sapien prosthetic valve, procedure date 01/14/2019. 3 cm x 0.3 cm vegetation on the TAVR on the RCC (seen best image 81). No distinct abscess. Mild PVL. No significant rocking or dehiscence. There is a 26 mm Sapien prosthetic (TAVR) valve present in the aortic position. Procedure Date: 01/14/2019.  7. There is mild (Grade II) plaque. Conclusion(s)/Recommendation(s): 3cm vegetation seen on the TAVR prosthesis. No vegetation seen on the device lead or the native cardiac valves. FINDINGS  Left Ventricle: Left ventricular ejection fraction, by estimation, is 55 to 60%. The left ventricle has normal function. The left  ventricular internal cavity size was normal in size. Right Ventricle: No vegetation seen on the device lead. The right ventricular size is normal. Right ventricular systolic function is normal. Left Atrium: No left atrial/left atrial appendage thrombus was detected. Mitral Valve: The mitral valve is degenerative in appearance. Moderate mitral annular calcification. Trivial mitral valve regurgitation. Tricuspid Valve: Tricuspid valve regurgitation is mild. Aortic Valve: 26 mm Sapien prosthetic valve, procedure date 01/14/2019. 3 cm x 0.3 cm vegetation on the TAVR on the RCC (seen best image 81). No distinct abscess. Mild PVL. No significant rocking or dehiscence. There is a 26 mm Sapien prosthetic, stented  (TAVR) valve present in the aortic position. Procedure Date: 01/14/2019. Pulmonic Valve: Pulmonic valve regurgitation is trivial. Aorta: There is mild (Grade II) plaque. IAS/Shunts: No atrial level shunt detected by color flow Doppler. Additional Comments: A device lead is visualized. There is a moderate pleural effusion. Spectral Doppler performed. MaPhineas Incheslectronically signed by MaPhineas Inchesignature Date/Time: 04/05/2022/12:04:22 PM    Final (Updated)       Recent Lab Findings: Lab Results  Component Value Date   WBC 7.6 04/05/2022   HGB 10.0 (L) 04/05/2022   HCT 31.4 (L) 04/05/2022   PLT 241 04/05/2022   GLUCOSE 108 (H) 04/05/2022   CHOL 207 (H) 07/12/2020   TRIG 277 (H) 07/12/2020  HDL 39 (L) 07/12/2020   LDLDIRECT 155.9 09/30/2012   LDLCALC 119 (H) 07/12/2020   ALT 25 03/31/2022   AST 52 (H) 03/31/2022   NA 136 04/05/2022   K 3.1 (L) 04/05/2022   CL 98 04/05/2022   CREATININE 0.70 04/05/2022   BUN 7 (L) 04/05/2022   CO2 27 04/05/2022   TSH 1.460 03/28/2022   INR 1.7 (H) 03/29/2022   HGBA1C 10.0 (H) 12/26/2021      Assessment / Plan:     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 However with no evidence of embolism since starting antibiotics and her age, obesity,hx of CAD, chronic SDH and other comorbidities would make surgical intervention on her prohibitive and would therefore have only medical management the therapy for her endocarditis. Would repeat her BC at some point to assure she is no longer bacteremic.  I have spent 60 min in review of the records, viewing studies and in face to face with patient and in coordination of future care    Coralie Common 04/05/2022 2:45 PM

## 2022-04-05 NOTE — Progress Notes (Addendum)
PROGRESS NOTE  Jessica [name]  DOB: November 14, 1933  PCP: Jessica Anger, MD WCB:[id number]  DOA: 03/28/2022  LOS: 8 days  Hospital Day: 9  Brief narrative: Jessica [name] is a 89 y.o. female with PMH significant for morbid obesity, DM2, HTN, HLD, A-fib/flutter on Eliquis, second-degree AV block, s/p PPM, chronic diastolic CHF, AS s/p TAVR, history of PE, GERD, hypothyroidism, osteoarthritis, anxiety/depression. 12/26, patient was brought to the ED by EMS from home for altered mental status. Per report, she had not been feeling well for few days, had decreased oral intake, nausea, vomiting and dysuria and essentially bedridden for the last few days.  In the ED, patient had a fever of 101.7, blood pressure low in 90s.  Labs showed WBC count at 13.3, lactic acid elevated to 4.7 CT abdomen pelvis was negative for any acute findings, showed possible atelectasis versus bilateral pneumonia CT head and CTA head and neck were obtained which showed questionable low-density subdural collection over the left cerebral hemisphere along with mild chronic small vessel ischemic changes. EDP discussed with neurosurgery, suspected the imaging findings were due to chronic SDH.  Recommended holding Eliquis and repeating CT head in 48 hours.  Initially admitted to [facility] Because of low blood pressure, she was given IV hydration but was limited due to pulmonary edema and new oxygen requirement.  Blood pressure remained low and hence PCCM was consulted. She was started on Levophed and upgraded to ICU. Blood culture sent on admission grew MSSA ID was consulted. 12/29, transferred out to [facility] See below for details.  Subjective: Patient was seen and examined this morning.   Propped up in bed.  Not in distress.  Mood appropriate.  On 3 L oxygen by nasal cannula. Pending TEE this morning.  Assessment and plan: Septic shock -POA MSSA bacteremia Presented with fever, lethargy, leukocytosis, lactic acid  elevation Blood culture sent on admission grew MSSA.  Source unclear at this time ID following.  Currently on IV Ancef ID recommended TEE for evaluation of TAVR and PPM device.  Also recommended consideration of PPM extraction.  Pending TEE this morning. WBC count, lactic acid level improved. Recent Labs  Lab 03/30/22 0347 03/31/22 1053 04/02/22 0037 04/03/22 0035 04/05/22 0044  WBC 11.1* 8.0 7.4 9.8 7.6  LATICACIDVEN  --  1.2  --   --   --   PROCALCITON 1.93  --   --   --   --    Acute exacerbation of chronic diastolic CHF Acute respiratory failure with hypoxia Initially blood pressure low because of shock.  Required pressors.  Blood pressure improved PTA on Toprol 50 mg daily, Lasix 40 mg twice daily, Entresto 97/103 mg twice daily. Medicines were initially held because of septic shock.  Gradually resumed. Patient received aggressive IV hydration during septic shock treatment.  She had refused positive balance.  Currently being diuresed with IV Lasix 40 mg twice daily.  Continue to monitor fluid balance.  Currently requiring 4 L oxygen by nasal cannula.  Wean down as tolerated Also continue Lopressor 25 mg twice daily, Entresto 97/103 mg twice daily.  Continue to monitor heart rate and blood pressure. Wean down oxygen as tolerated. Net IO Since Admission: 5,594.82 mL [04/05/22 1035] Continue to monitor for daily intake output, weight, blood pressure, BNP, renal function and electrolytes. Recent Labs  Lab 03/30/22 0347 03/31/22 1053 04/02/22 0037 04/03/22 0035 04/05/22 0044  BNP 471.9*  --   --   --   --   BUN 13  $'9 12 8 'B$ 7*  CREATININE 0.86 0.74 0.77 0.64 0.70  K 3.4* 3.9 4.6 3.5 3.1*  MG 2.2 2.2  --   --   --    A-fib/flutter second-degree AV block s/p PPM Continue to monitor in telemetry on metoprolol. Eliquis currently remains on hold.  Currently on full dose Lovenox while awaiting for TEE  Uncontrolled type 2 diabetes mellitus with hyperglycemia A1c 10 on  12/26/2021 PTA on Prandin 4 mg Premeal 3 times daily, metformin 500 mg twice daily. Both resumed.  Also on Semglee at 5 units nightly along with sliding scale insulin. Recent Labs  Lab 04/04/22 1041 04/04/22 1627 04/04/22 2107 04/05/22 0825 04/05/22 1021  GLUCAP 143* 142* 113* 110* 976*   AKI Metabolic acidosis Creatinine was elevated to 1.3 on admission.  Improved with hydration.  Serum bicarb level improved as well. Recent Labs    06/22/21 1214 09/22/21 1151 12/26/21 1218 03/28/22 1123 03/29/22 0522 03/30/22 0347 03/31/22 1053 04/02/22 0037 04/03/22 0035 04/05/22 0044  BUN 19 21 24* '17 18 13 9 12 8 '$ 7*  CREATININE 0.90 0.90 0.94 1.31* 1.10* 0.86 0.74 0.77 0.64 0.70  CO2 '27 26 25 '$ 20* 18* '24 25 24 25 27   '$ Hypokalemia/hypophosphatemia Potassium level low at 3.1 today.  Replacement ordered. Recent Labs  Lab 03/30/22 0347 03/30/22 1927 03/31/22 1053 04/02/22 0037 04/03/22 0035 04/05/22 0044  K 3.4*  --  3.9 4.6 3.5 3.1*  MG 2.2  --  2.2  --   --   --   PHOS 1.5* 1.2* 1.5*  --  3.3  --    Acute metabolic encephalopathy Secondary to septic shock. Improved.  Presumed chronic SDH Initial CT head finding as above showing subdural collection. Repeat CT head 12/28 without changes.  No need of intervention per neurosurgery.  Hyperlipidemia PTA on Zetia 10 mg daily  Hypothyroidism Synthroid 75 mg daily  Anxiety/depression PTA on Effexor, Restoril, Ativan as needed. Add Percocet as needed today.  Impaired mobility  PT eval obtained.  SNF recommended   Goals of care   Code Status: DNR   Scheduled Meds:  [MAR Hold] enoxaparin (LOVENOX) injection  120 mg Subcutaneous Q12H   [MAR Hold] ezetimibe  10 mg Oral Daily   [MAR Hold] furosemide  40 mg Intravenous BID   [MAR Hold] insulin aspart  0-5 Units Subcutaneous QHS   [MAR Hold] insulin aspart  0-9 Units Subcutaneous TID WC   [MAR Hold] insulin glargine-yfgn  5 Units Subcutaneous QHS   [MAR Hold] levothyroxine   75 mcg Oral Q0600   [MAR Hold] metFORMIN  500 mg Oral BID WC   [MAR Hold] metoprolol tartrate  25 mg Oral BID   [MAR Hold] mouth rinse  15 mL Mouth Rinse 4 times per day   [MAR Hold] repaglinide  4 mg Oral TID with meals   [MAR Hold] rosuvastatin  10 mg Oral Q M,W,F-2000   [MAR Hold] sacubitril-valsartan  1 tablet Oral BID   [MAR Hold] sodium chloride flush  3 mL Intravenous Q12H   [MAR Hold] venlafaxine XR  75 mg Oral Q breakfast    PRN meds: [MAR Hold] docusate sodium, [MAR Hold] ipratropium-albuterol, [MAR Hold] loperamide, [MAR Hold] LORazepam, [MAR Hold] ondansetron **OR** [MAR Hold] ondansetron (ZOFRAN) IV, [MAR Hold] mouth rinse, [MAR Hold] oxyCODONE-acetaminophen, [MAR Hold] polyethylene glycol   Infusions:   sodium chloride Stopped (04/03/22 1058)   sodium chloride 20 mL/hr at 04/05/22 0846   [MAR Hold]  ceFAZolin (ANCEF) IV 2 g (04/05/22  0224)    Skin assessment:     Nutritional status:  Body mass index is 48.1 kg/m.          Diet:  Diet Order             Diet NPO time specified Except for: Sips with Meds  Diet effective now                   DVT prophylaxis:  Place and maintain sequential compression device Start: 03/29/22 0749 SCDs Start: 03/28/22 1953   Antimicrobials: Ancef IV Fluid: None Consultants: Cardiology, ID Family Communication: I called and discussed with patient's niece on the phone this afternoon.  Status is: Inpatient  Continue in-hospital care because: Ongoing workup.  Pending TEE this morning. Level of care: Progressive   Dispo: The patient is from: Home              Anticipated d/c is to: Pending clinical course              Patient currently is not medically stable to d/c.   Difficult to place patient No    Antimicrobials: Anti-infectives (From admission, onward)    Start     Dose/Rate Route Frequency Ordered Stop   03/30/22 1300  vancomycin (VANCOREADY) IVPB 1500 mg/300 mL  Status:  Discontinued        1,500  mg 150 mL/hr over 120 Minutes Intravenous Every 48 hours 03/28/22 1315 03/29/22 0735   03/29/22 1000  [MAR Hold]  ceFAZolin (ANCEF) IVPB 2g/100 mL premix        (MAR Hold since Wed 04/05/2022 at 1005.Hold Reason: Transfer to a Procedural area)   2 g 200 mL/hr over 30 Minutes Intravenous Every 8 hours 03/29/22 0742     03/29/22 0000  metroNIDAZOLE (FLAGYL) IVPB 500 mg  Status:  Discontinued        500 mg 100 mL/hr over 60 Minutes Intravenous Every 12 hours 03/28/22 1837 03/29/22 0735   03/28/22 2200  ceFEPIme (MAXIPIME) 2 g in sodium chloride 0.9 % 100 mL IVPB  Status:  Discontinued        2 g 200 mL/hr over 30 Minutes Intravenous Every 12 hours 03/28/22 1851 03/29/22 0735   03/28/22 1430  metroNIDAZOLE (FLAGYL) IVPB 500 mg        500 mg 100 mL/hr over 60 Minutes Intravenous  Once 03/28/22 1427 03/28/22 1655   03/28/22 1130  vancomycin (VANCOREADY) IVPB 2000 mg/400 mL        2,000 mg 200 mL/hr over 120 Minutes Intravenous  Once 03/28/22 1129 03/28/22 1457   03/28/22 1045  ceFEPIme (MAXIPIME) 2 g in sodium chloride 0.9 % 100 mL IVPB        2 g 200 mL/hr over 30 Minutes Intravenous  Once 03/28/22 1042 03/28/22 1203       Objective: Vitals:   04/05/22 0725 04/05/22 1018  BP: (!) 130/58 (!) 164/74  Pulse: 85 77  Resp: 20 (!) 25  Temp: 97.8 F (36.6 C) 98.1 F (36.7 C)  SpO2: 100% 96%    Intake/Output Summary (Last 24 hours) at 04/05/2022 1035 Last data filed at 04/05/2022 4680 Gross per 24 hour  Intake 337 ml  Output 2000 ml  Net -1663 ml   Filed Weights   04/02/22 0102 04/04/22 0400 04/05/22 0500  Weight: 120.7 kg 118.9 kg 119.3 kg   Weight change: 0.4 kg Body mass index is 48.1 kg/m.   Physical Exam: General exam: Pleasant, elderly, not in pain  Skin: No rashes, lesions or ulcers. HEENT: Atraumatic, normocephalic, no obvious bleeding Lungs: Diminished air entry in both bases CVS: Regular rate and rhythm, no murmur GI/Abd soft, nontender, nondistended, bowel sound  present CNS: Alert, awake, oriented to place and person Psychiatry: Mood appropriate Extremities: No pedal edema, no calf tenderness  Data Review: I have personally reviewed the laboratory data and studies available.  F/u labs ordered Unresulted Labs (From admission, onward)    None       Total time spent in review of labs and imaging, patient evaluation, formulation of plan, documentation and communication with family 64 minutes  Signed, Terrilee Croak, MD [facility] 04/05/2022

## 2022-04-05 NOTE — Progress Notes (Signed)
  Echocardiogram Echocardiogram Transesophageal has been performed.  Jessica [name] 04/05/2022, 11:28 AM

## 2022-04-05 NOTE — Interval H&P Note (Signed)
History and Physical Interval Note:  04/05/2022 10:06 AM  Jessica [name]  has presented today for surgery, with the diagnosis of bacteremia.  The various methods of treatment have been discussed with the patient and family. After consideration of risks, benefits and other options for treatment, the patient has consented to  Procedure(s): TRANSESOPHAGEAL ECHOCARDIOGRAM (TEE) (N/A) as a surgical intervention.  The patient's history has been reviewed, patient examined, no change in status, stable for surgery.  I have reviewed the patient's chart and labs.  Questions were answered to the patient's satisfaction.     [name]

## 2022-04-05 NOTE — TOC Progression Note (Signed)
Transition of Care [facility]) - Progression Note    Patient Details  Name: CORTINA VULTAGGIO MRN: [id number] Date of Birth: 04-27-1933  Transition of Care [facility]) CM/SW [place], LCSW Phone Number: 04/05/2022, 3:40 PM  Clinical Narrative:  CSW met with pt and family at bedside. CSW informed pt that [facility] does have a bed available and CSW will start insurance auth tomorrow to dc possibly this weekend. Pt and family had no additional questions.      Expected Discharge Plan: [place] Barriers to Discharge: Continued Medical Work up  Expected Discharge Plan and [place] arrangements for the past 2 months: [place] Determinants of Health (SDOH) Interventions [place]: No Food Insecurity (03/30/2022)  Housing: Low Risk  (03/30/2022)  Transportation Needs: No Transportation Needs (03/30/2022)  Utilities: Not At Risk (03/30/2022)  Alcohol Screen: Low Risk  (07/01/2021)  Depression (PHQ2-9): Low Risk  (07/01/2021)  Financial Resource Strain: Low Risk  (07/01/2021)  Physical Activity: Inactive (07/01/2021)  Social Connections: Socially Isolated (07/01/2021)  Stress: No Stress Concern Present (07/01/2021)  Tobacco Use: Low Risk  (04/05/2022)    Readmission Risk Interventions     No data to display         [name], MSW, LCSWA, LCASA Transitions of Care  Clinical Social Worker I

## 2022-04-05 NOTE — Plan of Care (Signed)

## 2022-04-05 NOTE — CV Procedure (Addendum)
INDICATIONS: infective endocarditis  PROCEDURE:   Informed consent was obtained prior to the procedure. The risks, benefits and alternatives for the procedure were discussed and the patient comprehended these risks.  Risks include, but are not limited to, cough, sore throat, vomiting, nausea, somnolence, esophageal and stomach trauma or perforation, bleeding, low blood pressure, aspiration, pneumonia, infection, trauma to the teeth and death.    After a procedural time-out, the oropharynx was anesthetized with 20% benzocaine spray.   During this procedure the patient was administered a total of propofol 290 mg to achieve and maintain moderate conscious sedation.  The patient's heart rate, blood pressure, and oxygen saturationweare monitored continuously during the procedure. The period of conscious sedation was 20  minutes, of which I was present face-to-face 100% of this time.  The transesophageal probe was inserted in the esophagus and stomach without difficulty and multiple views were obtained.  The patient was kept under observation until the patient left the procedure room.  The patient left the procedure room in stable condition.   Agitated microbubble saline contrast was not administered.  COMPLICATIONS:    There were no immediate complications.  FINDINGS:  Normal LV function + ~ 3 cm vegetation on the TAVR valve No vegetations seen on the native cardiac valves PVL  RECOMMENDATIONS:     Continue antibiotics  Consider Ep consult for IE with device; as well as CT surgery  Time Spent Directly with the Patient:  20 minutes   [name] 04/05/2022, 11:23 AM

## 2022-04-05 NOTE — Progress Notes (Signed)
   [place] has been requested to perform a transesophageal echocardiogram on Keylah W Derden for bacteremia.  After careful review of history and examination, the risks and benefits of transesophageal echocardiogram have been explained including risks of esophageal damage, perforation (1:10,000 risk), bleeding, pharyngeal hematoma as well as other potential complications associated with anesthesia including aspiration, arrhythmia, respiratory failure and death. Alternatives to treatment were discussed, questions were answered. Patient is willing to proceed.   89 years old patient with PMH of nonobstructive CAD, HTN, HLD, DM II, afib s/p AV nodal ablation 2019, h/o CHB s/p PPM, severe AS s/p TAVR and chronic systolic and diastolic CHF presented with sepsis. Need to rule out bacterial endocarditis, infection of pacemaker lead and TAVR valve. Vital sign stable. Alert and oriented this morning. No sign of significant anemia or thrombocytopenia. Pending TEE today at 11:30AM by Dr. Phineas Inches.  [place], [place] 04/05/2022 7:37 AM

## 2022-04-06 ENCOUNTER — Inpatient Hospital Stay: Payer: Self-pay

## 2022-04-06 DIAGNOSIS — T826XXA Infection and inflammatory reaction due to cardiac valve prosthesis, initial encounter: Secondary | ICD-10-CM | POA: Diagnosis not present

## 2022-04-06 DIAGNOSIS — T827XXD Infection and inflammatory reaction due to other cardiac and vascular devices, implants and grafts, subsequent encounter: Secondary | ICD-10-CM

## 2022-04-06 DIAGNOSIS — I4892 Unspecified atrial flutter: Secondary | ICD-10-CM

## 2022-04-06 DIAGNOSIS — I4891 Unspecified atrial fibrillation: Secondary | ICD-10-CM | POA: Diagnosis not present

## 2022-04-06 DIAGNOSIS — R7881 Bacteremia: Secondary | ICD-10-CM | POA: Diagnosis not present

## 2022-04-06 DIAGNOSIS — B9561 Methicillin susceptible Staphylococcus aureus infection as the cause of diseases classified elsewhere: Secondary | ICD-10-CM | POA: Diagnosis not present

## 2022-04-06 LAB — COMPREHENSIVE METABOLIC PANEL
ALT: 10 U/L (ref 0–44)
AST: 35 U/L (ref 15–41)
Albumin: 2.5 g/dL — ABNORMAL LOW (ref 3.5–5.0)
Alkaline Phosphatase: 72 U/L (ref 38–126)
Anion gap: 9 (ref 5–15)
BUN: 7 mg/dL — ABNORMAL LOW (ref 8–23)
CO2: 28 mmol/L (ref 22–32)
Calcium: 8.7 mg/dL — ABNORMAL LOW (ref 8.9–10.3)
Chloride: 98 mmol/L (ref 98–111)
Creatinine, Ser: 0.75 mg/dL (ref 0.44–1.00)
GFR, Estimated: >60 mL/min (ref >60)
Glucose, Bld: 187 mg/dL — ABNORMAL HIGH (ref 70–99)
Potassium: 3.4 mmol/L — ABNORMAL LOW (ref 3.5–5.1)
Sodium: 135 mmol/L (ref 135–145)
Total Bilirubin: 0.7 mg/dL (ref 0.3–1.2)
Total Protein: 7.4 g/dL (ref 6.5–8.1)

## 2022-04-06 LAB — GLUCOSE, CAPILLARY
Glucose-Capillary: 126 mg/dL — ABNORMAL HIGH (ref 70–99)
Glucose-Capillary: 198 mg/dL — ABNORMAL HIGH (ref 70–99)
Glucose-Capillary: 149 mg/dL — ABNORMAL HIGH (ref 70–99)
Glucose-Capillary: 96 mg/dL (ref 70–99)

## 2022-04-06 MED ORDER — RIFAMPIN 300 MG PO CAPS
300.0000 mg | ORAL_CAPSULE | Freq: Three times a day (TID) | ORAL | Status: DC
  Administered 2022-04-06 – 2022-04-09 (×10): 300 mg via ORAL
  Filled 2022-04-06 (×12): qty 1

## 2022-04-06 MED ORDER — PHENOL 1.4 % MT LIQD
1.0000 | OROMUCOSAL | Status: DC | PRN
  Administered 2022-04-06 (×2): 1 via OROMUCOSAL
  Filled 2022-04-06: qty 177

## 2022-04-06 MED ORDER — POTASSIUM CHLORIDE CRYS ER 20 MEQ PO TBCR
40.0000 meq | EXTENDED_RELEASE_TABLET | Freq: Once | ORAL | Status: AC
  Administered 2022-04-06: 40 meq via ORAL
  Filled 2022-04-06: qty 2

## 2022-04-06 NOTE — Progress Notes (Signed)
Occupational Therapy Treatment Patient Details Name: Jessica [name] MRN: [id number] DOB: [birth year] Today's Date: 04/06/2022   History of present illness 89 yo female presents to [facility] on 12/26 with AMS, decreased intake. CT abd/pelvis shows possible atelectasis vs basilar PNA. CT and CTA head/neck which demonstrated questionable low density subdural collection over the left cerebral hemisphere along with mild chronic small vessel ischemic changes (presumed chronic). Workup for MSSA bacteremia, sepsis. TEE 04/05/21. PMH includes chronic diastolic heart failure, 2nd degree AV block-s/p pacemaker implantation, atrial fibrillation on Eliquis, hypertension, hyperlipidemia, chronic kidney disease, thyroid disease, TAVR, and depression.   OT comments  Patient supine in bed, agreeable for therapy with encouragement.  Remains mildly confused, initially disoriented to place and situation but able to reorient with min cueing. She demonstrates improved initiation and decreased physical assist today completing bed mobility with min assist +2 and standing with mod assist +2.  Completes Adls with setup for grooming, min assist for UB dressing and total for LB dressing.  Will follow acutely, continue to recommend SNF.    Recommendations for follow up therapy are one component of a multi-disciplinary discharge planning process, led by the attending physician.  Recommendations may be updated based on patient status, additional functional criteria and insurance authorization.    Follow Up Recommendations  Skilled nursing-short term rehab (<3 hours/day)     Assistance Recommended at Discharge Frequent or constant Supervision/Assistance  Patient can return home with the following  Two people to help with walking and/or transfers;Two people to help with bathing/dressing/bathroom;Assistance with cooking/housework;Direct supervision/assist for medications management;Direct supervision/assist for financial management;Help  with stairs or ramp for entrance;Assist for transportation   Equipment Recommendations  BSC/3in1    Recommendations for Other Services      Precautions / Restrictions Precautions Precautions: Fall Precaution Comments: watch O2 Restrictions Weight Bearing Restrictions: No       Mobility Bed Mobility Overal bed mobility: Needs Assistance Bed Mobility: Supine to Sit, Sit to Supine     Supine to sit: Min assist, +2 for physical assistance, +2 for safety/equipment, HOB elevated Sit to supine: Mod assist, +2 for physical assistance, +2 for safety/equipment, HOB elevated        Transfers Overall transfer level: Needs assistance Equipment used: Rolling walker (2 wheels), 1 person hand held assist Transfers: Sit to/from Stand Sit to Stand: Mod assist, +2 physical assistance, +2 safety/equipment                 Balance Overall balance assessment: Needs assistance Sitting-balance support: Feet supported, No upper extremity supported Sitting balance-Leahy Scale: Fair Sitting balance - Comments: min guard to close supervision   Standing balance support: Bilateral upper extremity supported, During functional activity Standing balance-Leahy Scale: Poor Standing balance comment: relies on BUE and external support                           ADL either performed or assessed with clinical judgement   ADL Overall ADL's : Needs assistance/impaired     Grooming: Wash/dry face;Set up           Upper Body Dressing : Minimal assistance;Sitting   Lower Body Dressing: Total assistance;+2 for safety/equipment;+2 for physical assistance;Sit to/from Health and safety inspector Details (indicate cue type and reason): side stepping towards HOB but declined OOB today         Functional mobility during ADLs: Moderate assistance;+2 for physical assistance;+2 for safety/equipment;Rolling walker (2 wheels);Cueing for safety General  ADL Comments: VSS on 2L [place]     Extremity/Trunk Assessment              Vision       Perception     Praxis      Cognition Arousal/Alertness: Awake/alert Behavior During Therapy: Flat affect Overall Cognitive Status: Impaired/Different from baseline Area of Impairment: Orientation, Following commands, Safety/judgement, Attention, Memory, Awareness, Problem solving                 Orientation Level: Disoriented to, Situation, Place Current Attention Level: Sustained Memory: Decreased recall of precautions, Decreased short-term memory Following Commands: Follows one step commands consistently, Follows one step commands with increased time, Follows multi-step commands inconsistently Safety/Judgement: Decreased awareness of safety, Decreased awareness of deficits Awareness: Intellectual Problem Solving: Slow processing, Difficulty sequencing, Requires verbal cues General Comments: patient initally reports being at a friends house, when cued to open her eyes she reports "hospital" and remains oriented throughout the rest of the session.  SHe follows simple commands with increased time, improved initation today, but poor awareness, safety and problem sovling voicing "I'm going home today".        Exercises      Shoulder Instructions       General Comments educated on UE exercises: shoulder flexion and hand grasp to complete throughout day as complains of weakness, completed x 10 reps today    Pertinent Vitals/ Pain       Pain Assessment Pain Assessment: Faces Faces Pain Scale: Hurts a little bit Pain Location: generalized Pain Descriptors / Indicators: Discomfort Pain Intervention(s): Limited activity within patient's tolerance, Monitored during session, Repositioned  Home Living                                          Prior Functioning/Environment              Frequency  Min 2X/week        Progress Toward Goals  OT Goals(current goals can now be found in the  care plan section)  Progress towards OT goals: Progressing toward goals  Acute Rehab OT Goals Patient Stated Goal: home OT Goal Formulation: With patient Time For Goal Achievement: 04/13/22 Potential to Achieve Goals: [place] Discharge plan remains appropriate;Frequency remains appropriate    Co-evaluation    PT/OT/SLP Co-Evaluation/Treatment: Yes Reason for Co-Treatment: For patient/therapist safety;To address functional/ADL transfers   OT goals addressed during session: ADL's and self-care      AM-PAC OT "6 Clicks" Daily Activity     Outcome Measure   Help from another person eating meals?: A Little Help from another person taking care of personal grooming?: A Little Help from another person toileting, which includes using toliet, bedpan, or urinal?: Total Help from another person bathing (including washing, rinsing, drying)?: A Lot Help from another person to put on and taking off regular upper body clothing?: A Little Help from another person to put on and taking off regular lower body clothing?: Total 6 Click Score: 13    End of Session Equipment Utilized During Treatment: Gait belt;Rolling walker (2 wheels)  OT Visit Diagnosis: Other abnormalities of gait and mobility (R26.89);Muscle weakness (generalized) (M62.81)   Activity Tolerance Patient tolerated treatment well   Patient Left in bed;with call bell/phone within reach;with bed alarm set   Nurse Communication Mobility status;Precautions        Time: 4193-7902 OT Time  Calculation (min): 25 min  Charges: OT General Charges $OT Visit: 1 Visit OT Treatments $Self Care/Home Management : 8-22 mins  Jolaine Artist, OT Acute Rehabilitation Services Office [phone]   [name] 04/06/2022, 10:13 AM

## 2022-04-06 NOTE — Progress Notes (Signed)
[facility]  Date of Admission:  03/28/2022      Total days of antibiotics 9  Cefazolin 12/27 >> c  Rifampin 04/06/22 > c         ASSESSMENT: Jessica [name] is a 89 y.o. female with MSSA prosthetic valve endocarditis involving TAVR. Presume the PPM is also affected.  TCTS surgery has seen and no surgical indications outside of size of vegetation (3 cm). Requested to repeat BCx to ensure she is not still bacteremic, which has already been established from 12/28 draw.  She is not a candidate for valve surgery and I don't think much benefit in removing the PPM empirically as she will need attempt at chronic antibiotic suppression with oral cefadroxil.  Will add Rifampin for synergy given PVE confirmed and she cleared bacteremia on 12/28. Start 300 mg TID. She will need to continue lovenox injections until she completes this given DDI between Rifampin and Eliquis. Will need to follow to see she can tolerate this well enough. Data for gentamicin as triple therapy not very strong and in our opinion not worth the risk of administration.   OK to place order for PICC line in preparation for ambulatory IV ABX. Will continue on 6 weeks cefazolin through  05/11/2022.   Chronic SDH - holding Eliquis. She is on Lovenox injections - will need to continue these while on Rifampin to avoid DDI.    PLAN: Place PICC line Continue Cefazolin IV  Start rifampin 300 mg TID - will follow her inpatient to see how she tolerates this. Counseled re: side effects including orange tint to urine and other body fluids and GI SE.  Continue Lovenox injections while on Rifampin - cannot be on Eliquis + Rifampin    Principal Problem:   MSSA bacteremia Active Problems:   Hypothyroidism   DM type 2, controlled, with complication (HCC)   HLD (hyperlipidemia)   OBESITY, MORBID   Anxiety disorder   Essential hypertension   Chronic diastolic heart failure (HCC)   Atrial  fibrillation and flutter (HCC)   Presence of permanent cardiac pacemaker   Altered mental status   AKI (acute kidney injury) ([place])   Severe sepsis with acute organ dysfunction (HCC)   DNR (do not resuscitate)   Hypotension   Prosthetic valve endocarditis (HCC)   Pacemaker infection ([place])    enoxaparin (LOVENOX) injection  120 mg Subcutaneous Q12H   ezetimibe  10 mg Oral Daily   furosemide  40 mg Intravenous BID   insulin aspart  0-5 Units Subcutaneous QHS   insulin aspart  0-9 Units Subcutaneous TID WC   insulin glargine-yfgn  5 Units Subcutaneous QHS   levothyroxine  75 mcg Oral Q0600   metFORMIN  500 mg Oral BID WC   metoprolol tartrate  25 mg Oral BID   potassium chloride  40 mEq Oral Once   repaglinide  4 mg Oral TID with meals   rifampin  300 mg Oral TID   rosuvastatin  10 mg Oral Q M,W,F-2000   sacubitril-valsartan  1 tablet Oral BID   sodium chloride flush  3 mL Intravenous Q12H   venlafaxine XR  75 mg Oral Q breakfast    SUBJECTIVE: She has a sore throat following TEE but feels a little better today than yesterday. Appetite is better.    Review of Systems: Review of Systems  Constitutional:  Negative for chills, fever, malaise/fatigue and weight loss.  HENT:  Positive  for sore throat.        No dental problems  Respiratory:  Negative for cough and sputum production.   Cardiovascular:  Negative for chest pain and leg swelling.  Gastrointestinal:  Negative for abdominal pain, diarrhea and vomiting.  Genitourinary:  Negative for dysuria and flank pain.  Musculoskeletal:  Negative for joint pain, myalgias and neck pain.  Skin:  Negative for rash.  Neurological:  Negative for dizziness, tingling and headaches.  Psychiatric/Behavioral:  Negative for depression and substance abuse. The patient is not nervous/anxious and does not have insomnia.     Allergies  Allergen Reactions   Coreg [Carvedilol] Other (See Comments)    Weak legs   Relafen [Nabumetone] Other (See  Comments)    Upset stomach   Calcium Other (See Comments)    Unknown reaction   Codeine Other (See Comments)    Unknown reaction   Lipitor [Atorvastatin] Other (See Comments)    Myalgias   Pacerone [Amiodarone] Other (See Comments)    Hand tremors   Vasotec [Enalapril] Cough   Vioxx [Rofecoxib] Other (See Comments)    Unknown reaction   Zocor [Simvastatin] Other (See Comments)    Myalgias    OBJECTIVE: Vitals:   04/06/22 0400 04/06/22 0708 04/06/22 1059 04/06/22 1200  BP:  (!) 143/77 (!) 127/58 (!) 122/57  Pulse: 70 100 100 83  Resp:  18 20 (!) 24  Temp:  97.9 F (36.6 C)    TempSrc:  Oral    SpO2: 98% 95%  95%  Weight:      Height:       Body mass index is 48.1 kg/m.  Physical Exam Vitals and nursing note reviewed.  Constitutional:      Appearance: She is well-developed.  HENT:     Mouth/Throat:     Mouth: No oral lesions.     Dentition: Normal dentition. No dental abscesses.     Pharynx: No oropharyngeal exudate.  Cardiovascular:     Rate and Rhythm: Normal rate and regular rhythm.     Heart sounds: Normal heart sounds.  Pulmonary:     Effort: Pulmonary effort is normal.     Breath sounds: Normal breath sounds.  Abdominal:     General: There is no distension.     Palpations: Abdomen is soft.     Tenderness: There is no abdominal tenderness.  Lymphadenopathy:     Cervical: No cervical adenopathy.  Skin:    General: Skin is warm and dry.     Findings: No rash.  Neurological:     Mental Status: She is alert and oriented to person, place, and time.  Psychiatric:        Judgment: Judgment normal.     Lab Results Lab Results  Component Value Date   WBC 7.6 04/05/2022   HGB 10.0 (L) 04/05/2022   HCT 31.4 (L) 04/05/2022   MCV 95.4 04/05/2022   PLT 241 04/05/2022    Lab Results  Component Value Date   CREATININE 0.75 04/06/2022   BUN 7 (L) 04/06/2022   NA 135 04/06/2022   K 3.4 (L) 04/06/2022   CL 98 04/06/2022   CO2 28 04/06/2022    Lab  Results  Component Value Date   ALT 10 04/06/2022   AST 35 04/06/2022   ALKPHOS 72 04/06/2022   BILITOT 0.7 04/06/2022     Microbiology: Recent Results (from the past 240 hour(s))  Blood Culture (routine x 2)     Status: Abnormal   Collection  Time: 03/28/22 10:41 AM   Specimen: BLOOD  Result Value Ref Range Status   Specimen Description BLOOD SITE NOT SPECIFIED  Final   Special Requests   Final    BOTTLES DRAWN AEROBIC AND ANAEROBIC Blood Culture adequate volume   Culture  Setup Time   Final    GRAM POSITIVE COCCI IN CLUSTERS IN BOTH AEROBIC AND ANAEROBIC BOTTLES CRITICAL RESULT CALLED TO, READ BACK BY AND VERIFIED WITH: T RUDISILL,PHARMD'@0052'$  03/29/22 [place] Performed at [facility], [place] [address], [place],  [address]    Culture STAPHYLOCOCCUS AUREUS (A)  Final   Report Status 03/31/2022 FINAL  Final   Organism ID, Bacteria STAPHYLOCOCCUS AUREUS  Final      Susceptibility   Staphylococcus aureus - MIC*    CIPROFLOXACIN <=0.5 SENSITIVE Sensitive     ERYTHROMYCIN <=0.25 SENSITIVE Sensitive     GENTAMICIN <=0.5 SENSITIVE Sensitive     OXACILLIN 0.5 SENSITIVE Sensitive     TETRACYCLINE <=1 SENSITIVE Sensitive     VANCOMYCIN <=0.5 SENSITIVE Sensitive     TRIMETH/SULFA <=10 SENSITIVE Sensitive     CLINDAMYCIN <=0.25 SENSITIVE Sensitive     RIFAMPIN <=0.5 SENSITIVE Sensitive     Inducible Clindamycin NEGATIVE Sensitive     * STAPHYLOCOCCUS AUREUS  Blood Culture ID Panel (Reflexed)     Status: Abnormal   Collection Time: 03/28/22 10:41 AM  Result Value Ref Range Status   Enterococcus faecalis NOT DETECTED NOT DETECTED Final   Enterococcus Faecium NOT DETECTED NOT DETECTED Final   Listeria monocytogenes NOT DETECTED NOT DETECTED Final   Staphylococcus species DETECTED (A) NOT DETECTED Final    Comment: CRITICAL RESULT CALLED TO, READ BACK BY AND VERIFIED WITH: T RUDISILL,PHARMD'@0052'$  03/29/22 [facility]    Staphylococcus aureus (BCID) DETECTED (A) NOT DETECTED Final     Comment: CRITICAL RESULT CALLED TO, READ BACK BY AND VERIFIED WITH: T RUDISILL,PHARMD'@0052'$  03/29/22 [place]    Staphylococcus epidermidis NOT DETECTED NOT DETECTED Final   Staphylococcus lugdunensis NOT DETECTED NOT DETECTED Final   Streptococcus species NOT DETECTED NOT DETECTED Final   Streptococcus agalactiae NOT DETECTED NOT DETECTED Final   Streptococcus pneumoniae NOT DETECTED NOT DETECTED Final   Streptococcus pyogenes NOT DETECTED NOT DETECTED Final   A.calcoaceticus-baumannii NOT DETECTED NOT DETECTED Final   Bacteroides fragilis NOT DETECTED NOT DETECTED Final   Enterobacterales NOT DETECTED NOT DETECTED Final   Enterobacter cloacae complex NOT DETECTED NOT DETECTED Final   Escherichia coli NOT DETECTED NOT DETECTED Final   Klebsiella aerogenes NOT DETECTED NOT DETECTED Final   Klebsiella oxytoca NOT DETECTED NOT DETECTED Final   Klebsiella pneumoniae NOT DETECTED NOT DETECTED Final   Proteus species NOT DETECTED NOT DETECTED Final   Salmonella species NOT DETECTED NOT DETECTED Final   Serratia marcescens NOT DETECTED NOT DETECTED Final   Haemophilus influenzae NOT DETECTED NOT DETECTED Final   Neisseria meningitidis NOT DETECTED NOT DETECTED Final   Pseudomonas aeruginosa NOT DETECTED NOT DETECTED Final   Stenotrophomonas maltophilia NOT DETECTED NOT DETECTED Final   Candida albicans NOT DETECTED NOT DETECTED Final   Candida auris NOT DETECTED NOT DETECTED Final   Candida glabrata NOT DETECTED NOT DETECTED Final   Candida krusei NOT DETECTED NOT DETECTED Final   Candida parapsilosis NOT DETECTED NOT DETECTED Final   Candida tropicalis NOT DETECTED NOT DETECTED Final   Cryptococcus neoformans/gattii NOT DETECTED NOT DETECTED Final   Meth resistant mecA/C and MREJ NOT DETECTED NOT DETECTED Final    Comment: Performed at [facility],  [address] [place], [place] [address]  Resp panel by RT-PCR (RSV, Flu A&B, Covid) Anterior Nasal Swab     Status: None   Collection  Time: 03/28/22 10:42 AM   Specimen: Anterior Nasal Swab  Result Value Ref Range Status   SARS Coronavirus 2 by RT PCR NEGATIVE NEGATIVE Final    Comment: (NOTE) SARS-CoV-2 target nucleic acids are NOT DETECTED.  The SARS-CoV-2 RNA is generally detectable in upper respiratory specimens during the acute phase of infection. The lowest concentration of SARS-CoV-2 viral copies this assay can detect is 138 copies/mL. A negative result does not preclude SARS-Cov-2 infection and should not be used as the sole basis for treatment or other patient management decisions. A negative result may occur with  improper specimen collection/handling, submission of specimen other than nasopharyngeal swab, presence of viral mutation(s) within the areas targeted by this assay, and inadequate number of viral copies(<138 copies/mL). A negative result must be combined with clinical observations, patient history, and epidemiological information. The expected result is Negative.  Fact Sheet for Patients:  EntrepreneurPulse.com.au  Fact Sheet for Healthcare Providers:  IncredibleEmployment.be  This test is no t yet approved or cleared by the [place] FDA and  has been authorized for detection and/or diagnosis of SARS-CoV-2 by FDA under an Emergency Use Authorization (EUA). This EUA will remain  in effect (meaning this test can be used) for the duration of the COVID-19 declaration under Section 564(b)(1) of the Act, 21 U.S.C.section 360bbb-3(b)(1), unless the authorization is terminated  or revoked sooner.       Influenza A by PCR NEGATIVE NEGATIVE Final   Influenza B by PCR NEGATIVE NEGATIVE Final    Comment: (NOTE) The Xpert Xpress SARS-CoV-2/FLU/RSV plus assay is intended as an aid in the diagnosis of influenza from Nasopharyngeal swab specimens and should not be used as a sole basis for treatment. Nasal washings and aspirates are unacceptable for Xpert Xpress  SARS-CoV-2/FLU/RSV testing.  Fact Sheet for Patients: EntrepreneurPulse.com.au  Fact Sheet for Healthcare Providers: IncredibleEmployment.be  This test is not yet approved or cleared by the [place] FDA and has been authorized for detection and/or diagnosis of SARS-CoV-2 by FDA under an Emergency Use Authorization (EUA). This EUA will remain in effect (meaning this test can be used) for the duration of the COVID-19 declaration under Section 564(b)(1) of the Act, 21 U.S.C. section 360bbb-3(b)(1), unless the authorization is terminated or revoked.     Resp Syncytial Virus by PCR NEGATIVE NEGATIVE Final    Comment: (NOTE) Fact Sheet for Patients: EntrepreneurPulse.com.au  Fact Sheet for Healthcare Providers: IncredibleEmployment.be  This test is not yet approved or cleared by the [place] FDA and has been authorized for detection and/or diagnosis of SARS-CoV-2 by FDA under an Emergency Use Authorization (EUA). This EUA will remain in effect (meaning this test can be used) for the duration of the COVID-19 declaration under Section 564(b)(1) of the Act, 21 U.S.C. section 360bbb-3(b)(1), unless the authorization is terminated or revoked.  Performed at [facility], [place] [address] [place], [place] [address]   Blood Culture (routine x 2)     Status: None   Collection Time: 03/28/22  3:00 PM   Specimen: BLOOD  Result Value Ref Range Status   Specimen Description BLOOD SITE NOT SPECIFIED  Final   Special Requests   Final    BOTTLES DRAWN AEROBIC AND ANAEROBIC Blood Culture adequate volume   Culture   Final    NO GROWTH 5 DAYS Performed  at [facility], [place] [address] [place], [place] [address]    Report Status 04/02/2022 FINAL  Final  Urine Culture     Status: None   Collection Time: 03/28/22  3:18 PM   Specimen: In/Out Cath Urine  Result Value Ref Range Status   Specimen  Description IN/OUT CATH URINE  Final   Special Requests NONE  Final   Culture   Final    NO GROWTH Performed at [facility], [place] [address] [place], [place] [address]    Report Status 03/29/2022 FINAL  Final  MRSA Next Gen by PCR, Nasal     Status: None   Collection Time: 03/29/22  8:31 PM   Specimen: Nasal Mucosa; Nasal Swab  Result Value Ref Range Status   MRSA by PCR Next Gen NOT DETECTED NOT DETECTED Final    Comment: (NOTE) The GeneXpert MRSA Assay (FDA approved for NASAL specimens only), is one component of a comprehensive MRSA colonization surveillance program. It is not intended to diagnose MRSA infection nor to guide or monitor treatment for MRSA infections. Test performance is not FDA approved in patients less than 29 years old. Performed at [facility], [place] [address], [place], [place] [address]   Culture, blood (Routine X 2) w Reflex to ID Panel     Status: None   Collection Time: 03/30/22  3:46 AM   Specimen: BLOOD RIGHT HAND  Result Value Ref Range Status   Specimen Description BLOOD RIGHT HAND  Final   Special Requests   Final    BOTTLES DRAWN AEROBIC AND ANAEROBIC Blood Culture adequate volume   Culture   Final    NO GROWTH 5 DAYS Performed at [facility], [place] [address], [place], [place] [address]    Report Status 04/04/2022 FINAL  Final  Culture, blood (Routine X 2) w Reflex to ID Panel     Status: None   Collection Time: 03/30/22  4:06 AM   Specimen: BLOOD RIGHT HAND  Result Value Ref Range Status   Specimen Description BLOOD RIGHT HAND  Final   Special Requests   Final    BOTTLES DRAWN AEROBIC ONLY Blood Culture adequate volume   Culture   Final    NO GROWTH 5 DAYS Performed at [facility], [place] [address], [place], [place] [address]    Report Status 04/04/2022 FINAL  Final    Jessica Madeira, MSN, NP-C [facility] Pager: [phone]  '@TODAY'$ @ 1:27 PM

## 2022-04-06 NOTE — Significant Event (Signed)
Pt assisted back to bed x2 assist max. Complained of dizziness resolved after lying in few mins. Vitals stable.

## 2022-04-06 NOTE — TOC Progression Note (Signed)
Transition of Care [facility]) - Progression Note    Patient Details  Name: Jessica [name] MRN: [id number] Date of Birth: 17-May-1933  Transition of Care [facility]) CM/SW [place], [place] Phone Number: 04/06/2022, 2:43 PM  Clinical Narrative:     Attempted to start auth in [employer] Portal though received the following message: "The current date falls outside of the patient's eligibility dates. You may only create an auth inside of these dates." Pt's eligibility date range is listed as 05/04/2021- 04/02/2022.  CSW called Everlene Balls and is informed they can still start the auth over the phone and that eligibility dates would be checked at a later time. Auth request started and can be pulled up in Navi portal. Documents have been uploaded to [employer] Portal. Auth status: pending. [id number]. TOC will continue to follow to assist with DC.   Expected Discharge Plan: [place] Barriers to Discharge: Continued Medical Work up  Expected Discharge Plan and [place] arrangements for the past 2 months: [place] Determinants of Health (SDOH) Interventions SDOH Screenings   Food Insecurity: No Food Insecurity (03/30/2022)  Housing: Low Risk  (03/30/2022)  Transportation Needs: No Transportation Needs (03/30/2022)  Utilities: Not At Risk (03/30/2022)  Alcohol Screen: Low Risk  (07/01/2021)  Depression (PHQ2-9): Low Risk  (07/01/2021)  Financial Resource Strain: Low Risk  (07/01/2021)  Physical Activity: Inactive (07/01/2021)  Social Connections: Socially Isolated (07/01/2021)  Stress: No Stress Concern Present (07/01/2021)  Tobacco Use: Low Risk  (04/05/2022)    Readmission Risk Interventions     No data to display

## 2022-04-06 NOTE — Progress Notes (Signed)
PHARMACY CONSULT NOTE FOR:  OUTPATIENT  PARENTERAL ANTIBIOTIC THERAPY (OPAT)  Informational only as noted plans to discharge back to SNF  Indication: MSSA MV IE Regimen: Cefazolin 2g IV every 8 hours + Rifampin 300 mg po q8h End date: 05/11/22 (6 weeks from neg BCx on 12/28)  After 6 weeks of Cefazolin the patient will need chronic suppression - likely with Cefadroxil.  IV antibiotic discharge orders are pended. To discharging provider:  please sign these orders via discharge navigator,  Select New Orders & click on the button choice - Manage This Unsigned Work.    Thank you for allowing pharmacy to be a part of this patient's care.  Alycia Rossetti, PharmD, BCPS Infectious Diseases Clinical Pharmacist 04/06/2022 8:03 AM   **Pharmacist phone directory can now be found on [email or web address] ([email or web address]).  Listed under [place].

## 2022-04-06 NOTE — Progress Notes (Signed)
Physical Therapy Treatment Patient Details Name: REALITY DEJONGE MRN: [id number] DOB: Nov 25, 1933 Today's Date: 04/06/2022   History of Present Illness 89 yo female presents to [facility] on 12/26 with AMS, decreased intake. CT abd/pelvis shows possible atelectasis vs basilar PNA. CT and CTA head/neck which demonstrated questionable low density subdural collection over the left cerebral hemisphere along with mild chronic small vessel ischemic changes (presumed chronic). Workup for MSSA bacteremia, sepsis. TEE 04/05/21. PMH includes chronic diastolic heart failure, 2nd degree AV block-s/p pacemaker implantation, atrial fibrillation on Eliquis, hypertension, hyperlipidemia, chronic kidney disease, thyroid disease, TAVR, and depression.    PT Comments    Pt treated in conjunction with OT as pt was very fatigued and needed encouragement to participate and likely would not tolerate 2 sessions today. Session focused on progressing pt's bed mobility and transfer independence. She demonstrated improved initiation with bed mobility, only requiring min-modAx2 today. She fatigued quickly in standing and was only able to tolerate taking a few steps at EOB before returning to sit and declining further attempts at gait. Had pt perform several lower extremity exercises and encouraged her to perform them throughout the day to increase her strength. Will continue to follow acutely. Current recommendations remain appropriate.     Recommendations for follow up therapy are one component of a multi-disciplinary discharge planning process, led by the attending physician.  Recommendations may be updated based on patient status, additional functional criteria and insurance authorization.  Follow Up Recommendations  Skilled nursing-short term rehab (<3 hours/day) Can patient physically be transported by private vehicle: No   Assistance Recommended at Discharge Frequent or constant Supervision/Assistance  Patient can return home  with the following Two people to help with walking and/or transfers;Two people to help with bathing/dressing/bathroom;Direct supervision/assist for financial management;Direct supervision/assist for medications management;Assist for transportation   Equipment Recommendations  None recommended by PT    Recommendations for Other Services       Precautions / Restrictions Precautions Precautions: Fall Precaution Comments: watch O2 Restrictions Weight Bearing Restrictions: No     Mobility  Bed Mobility Overal bed mobility: Needs Assistance Bed Mobility: Supine to Sit, Sit to Supine     Supine to sit: Min assist, +2 for physical assistance, +2 for safety/equipment, HOB elevated Sit to supine: Mod assist, +2 for physical assistance, +2 for safety/equipment, HOB elevated   General bed mobility comments: Improved initiation by pt to manage legs off and on bed and cues provided for use of bed rails. MinAx2 to ascend trunk and scoot hips to EOB using bed pad. ModAx2 to assist legs and trunk back to supine.    Transfers Overall transfer level: Needs assistance Equipment used: Rolling walker (2 wheels), 1 person hand held assist Transfers: Sit to/from Stand Sit to Stand: Mod assist, +2 physical assistance, +2 safety/equipment           General transfer comment: ModAx2 to power up to stand and gain stability from EOB to RW. Pt taking a couple small steps along EOB with RW but then began to lean posteriorly with feet sliding anteriorly and thus was returned to sit EOB    Ambulation/Gait               General Gait Details: couple small steps along EOB before pt fatigued and feet began to slip and she was returned to sit. Pt declined further attempts at gait   Marine scientist  Rankin (Stroke Patients Only)       Balance Overall balance assessment: Needs assistance Sitting-balance support: Feet supported, No upper extremity  supported Sitting balance-Leahy Scale: Fair Sitting balance - Comments: min guard to close supervision   Standing balance support: Bilateral upper extremity supported, During functional activity Standing balance-Leahy Scale: Poor Standing balance comment: relies on BUE and external support                            Cognition Arousal/Alertness: Awake/alert Behavior During Therapy: Flat affect Overall Cognitive Status: Impaired/Different from baseline Area of Impairment: Orientation, Following commands, Safety/judgement, Attention, Memory, Awareness, Problem solving                 Orientation Level: Disoriented to, Situation, Place Current Attention Level: Sustained Memory: Decreased recall of precautions, Decreased short-term memory Following Commands: Follows one step commands consistently, Follows one step commands with increased time, Follows multi-step commands inconsistently Safety/Judgement: Decreased awareness of safety, Decreased awareness of deficits Awareness: Intellectual Problem Solving: Slow processing, Difficulty sequencing, Requires verbal cues General Comments: patient initally reports being at a friends house, when cued to open her eyes she reports "hospital" and remains oriented throughout the rest of the session.  SHe follows simple commands with increased time, improved initation today, but poor awareness, safety and problem sovling voicing "I'm going home today".        Exercises General Exercises - Lower Extremity Ankle Circles/Pumps: AROM, Both, 10 reps, Supine Long Arc Quad: AROM, Both, 10 reps, Seated Straight Leg Raises: AROM, Both, 5 reps, Supine    General Comments General comments (skin integrity, edema, etc.): educated on UE exercises: shoulder flexion and hand grasp to complete throughout day as complains of weakness, completed x 10 reps today      Pertinent Vitals/Pain Pain Assessment Pain Assessment: Faces Faces Pain Scale:  Hurts a little bit Pain Location: generalized Pain Descriptors / Indicators: Discomfort Pain Intervention(s): Limited activity within patient's tolerance, Monitored during session, Repositioned    Home Living                          Prior Function            PT Goals (current goals can now be found in the care plan section) Acute Rehab PT Goals Patient Stated Goal: to go home PT Goal Formulation: With patient Time For Goal Achievement: 04/13/22 Potential to Achieve Goals: Good Progress towards PT goals: Progressing toward goals    Frequency    Min 2X/week      PT Plan Current plan remains appropriate    Co-evaluation PT/OT/SLP Co-Evaluation/Treatment: Yes Reason for Co-Treatment: Necessary to address cognition/behavior during functional activity;For patient/therapist safety;To address functional/ADL transfers PT goals addressed during session: Mobility/safety with mobility;Balance;Proper use of DME;Strengthening/ROM OT goals addressed during session: ADL's and self-care      AM-PAC PT "6 Clicks" Mobility   Outcome Measure  Help needed turning from your back to your side while in a flat bed without using bedrails?: A Lot Help needed moving from lying on your back to sitting on the side of a flat bed without using bedrails?: A Lot Help needed moving to and from a bed to a chair (including a wheelchair)?: Total Help needed standing up from a chair using your arms (e.g., wheelchair or bedside chair)?: Total Help needed to walk in hospital room?: Total Help needed climbing 3-5 steps with a railing? :  Total 6 Click Score: 8    End of Session Equipment Utilized During Treatment: Gait belt;Oxygen Activity Tolerance: Patient limited by fatigue Patient left: in bed;with call bell/phone within reach;with bed alarm set   PT Visit Diagnosis: Muscle weakness (generalized) (M62.81);Other abnormalities of gait and mobility (R26.89);Unsteadiness on feet  (R26.81);Difficulty in walking, not elsewhere classified (R26.2)     Time: 7255-0016 PT Time Calculation (min) (ACUTE ONLY): 23 min  Charges:  $Therapeutic Activity: 8-22 mins                     Moishe Spice, PT, DPT Acute Rehabilitation Services  Office: [phone]    Orvan Falconer 04/06/2022, 11:11 AM

## 2022-04-06 NOTE — Progress Notes (Signed)
PROGRESS NOTE  Jessica [name]  DOB: 1933-08-26  PCP: [name], MD GGE:[id number]  DOA: 03/28/2022  LOS: 9 days  Hospital Day: 10  Brief narrative: Jessica [name] is a 89 y.o. female with PMH significant for morbid obesity, DM2, HTN, HLD, A-fib/flutter on Eliquis, second-degree AV block, s/p PPM, chronic diastolic CHF, AS s/p TAVR, history of PE, GERD, hypothyroidism, osteoarthritis, anxiety/depression. 12/26, patient was brought to the ED by EMS from home for altered mental status. Per report, she had not been feeling well for few days, had decreased oral intake, nausea, vomiting and dysuria and essentially bedridden for the last few days.  In the ED, patient had a fever of 101.7, blood pressure low in 90s.  Labs showed WBC count at 13.3, lactic acid elevated to 4.7 CT abdomen pelvis was negative for any acute findings, showed possible atelectasis versus bilateral pneumonia CT head and CTA head and neck were obtained which showed questionable low-density subdural collection over the left cerebral hemisphere along with mild chronic small vessel ischemic changes. EDP discussed with neurosurgery, suspected the imaging findings were due to chronic SDH.  Recommended holding Eliquis and repeating CT head in 48 hours.  Initially admitted to [facility] Because of low blood pressure, she was given IV hydration but was limited due to pulmonary edema and new oxygen requirement.  Blood pressure remained low and hence PCCM was consulted. She was started on Levophed and upgraded to ICU. Blood culture sent on admission grew MSSA ID was consulted. 12/29, transferred out to [facility] See below for details.  Subjective: Patient was seen and examined this morning.   Propped up in bed.  Not in distress.  On 2 L oxygen nasal cannula. Underwent TEE yesterday.  Noted to have vegetation.  Assessment and plan: Septic shock -POA MSSA bacteremia TAVR endocarditis Presented with fever, lethargy,  leukocytosis, lactic acid elevation Blood culture sent on admission grew MSSA.   1/3, TEE showed 3 cm vegetation on the TAVR valve, no vegetation seen in the native cardiac valves or pacemaker leads.  Patient was seen by CT surgery. Patient is currently not septic and has no indication for surgery except for size of vegetation.  However given her advanced age, history of CAD and other comorbidities, she would be more appropriate for medical management at this time. Last blood culture from 12/28 did not show any growth in 5 days. WBC count normal, lactic acid level improved. ID following.  Currently on IV Ancef.  I discussed with ID today.  Ordered for PICC line placement. Recent Labs  Lab 03/31/22 1053 04/02/22 0037 04/03/22 0035 04/05/22 0044  WBC 8.0 7.4 9.8 7.6  LATICACIDVEN 1.2  --   --   --    Acute exacerbation of chronic diastolic CHF Acute respiratory failure with hypoxia Initially blood pressure low because of shock.  Required pressors.  Blood pressure improved PTA on Toprol 50 mg daily, Lasix 40 mg twice daily, Entresto 97/103 mg twice daily. Medicines were initially held because of septic shock.  Gradually resumed. Patient received aggressive IV hydration during septic shock treatment.  She had large positive balance.  Currently being diuresed with IV Lasix 40 mg twice daily.  Continue to monitor fluid balance.  Currently requiring 4 L oxygen by nasal cannula.  Wean down as tolerated Also continue Lopressor 25 mg twice daily, Entresto 97/103 mg twice daily.  Continue to monitor heart rate and blood pressure. Wean down oxygen as tolerated. Net IO Since Admission: 3,856.82 mL [04/06/22  1259] Continue to monitor for daily intake output, weight, blood pressure, BNP, renal function and electrolytes. Recent Labs  Lab 03/31/22 1053 04/02/22 0037 04/03/22 0035 04/05/22 0044 04/06/22 0841  BUN '9 12 8 '$ 7* 7*  CREATININE 0.74 0.77 0.64 0.70 0.75  K 3.9 4.6 3.5 3.1* 3.4*  MG 2.2  --    --   --   --    A-fib/flutter second-degree AV block s/p PPM Continue to monitor in telemetry on metoprolol. Switch back from Lovenox to Eliquis today.  Uncontrolled type 2 diabetes mellitus with hyperglycemia A1c 10 on 12/26/2021 PTA on Prandin 4 mg Premeal 3 times daily, metformin 500 mg twice daily. Both resumed.  Also on Semglee at 5 units nightly along with sliding scale insulin. Recent Labs  Lab 04/05/22 1207 04/05/22 1601 04/05/22 2109 04/06/22 0609 04/06/22 1058  GLUCAP 104* 107* 128* 126* 540*   AKI Metabolic acidosis Creatinine was elevated to 1.3 on admission.  Improved with hydration.  Serum bicarb level improved as well. Recent Labs    09/22/21 1151 12/26/21 1218 03/28/22 1123 03/29/22 0522 03/30/22 0347 03/31/22 1053 04/02/22 0037 04/03/22 0035 04/05/22 0044 04/06/22 0841  BUN 21 24* '17 18 13 9 12 8 '$ 7* 7*  CREATININE 0.90 0.94 1.31* 1.10* 0.86 0.74 0.77 0.64 0.70 0.75  CO2 26 25 20* 18* '24 25 24 25 27 28   '$ Hypokalemia/hypophosphatemia Potassium level low at 3.4 today.  Replacement ordered. Recent Labs  Lab 03/30/22 1927 03/31/22 1053 04/02/22 0037 04/03/22 0035 04/05/22 0044 04/06/22 0841  K  --  3.9 4.6 3.5 3.1* 3.4*  MG  --  2.2  --   --   --   --   PHOS 1.2* 1.5*  --  3.3  --   --    Acute metabolic encephalopathy Secondary to septic shock. Improved.  Presumed chronic SDH Initial CT head finding as above showing subdural collection. Repeat CT head 12/28 without changes.  No need of intervention per neurosurgery.  Hyperlipidemia PTA on Zetia 10 mg daily  Hypothyroidism Synthroid 75 mg daily  Anxiety/depression PTA on Effexor, Restoril, Ativan as needed. Add Percocet as needed today.  Impaired mobility  PT eval obtained.  SNF recommended   Goals of care   Code Status: DNR   Scheduled Meds:  enoxaparin (LOVENOX) injection  120 mg Subcutaneous Q12H   ezetimibe  10 mg Oral Daily   furosemide  40 mg Intravenous BID   insulin  aspart  0-5 Units Subcutaneous QHS   insulin aspart  0-9 Units Subcutaneous TID WC   insulin glargine-yfgn  5 Units Subcutaneous QHS   levothyroxine  75 mcg Oral Q0600   metFORMIN  500 mg Oral BID WC   metoprolol tartrate  25 mg Oral BID   potassium chloride  40 mEq Oral Once   repaglinide  4 mg Oral TID with meals   rifampin  300 mg Oral TID   rosuvastatin  10 mg Oral Q M,W,F-2000   sacubitril-valsartan  1 tablet Oral BID   sodium chloride flush  3 mL Intravenous Q12H   venlafaxine XR  75 mg Oral Q breakfast    PRN meds: docusate sodium, ipratropium-albuterol, loperamide, LORazepam, ondansetron **OR** ondansetron (ZOFRAN) IV, mouth rinse, oxyCODONE-acetaminophen, phenol, polyethylene glycol   Infusions:   sodium chloride 250 mL (04/06/22 1043)    ceFAZolin (ANCEF) IV 2 g (04/06/22 1043)    Skin assessment:     Nutritional status:  Body mass index is 48.1 kg/m.  Diet:  Diet Order             Diet Carb Modified Fluid consistency: Thin; Room service appropriate? Yes  Diet effective now                   DVT prophylaxis:  Place and maintain sequential compression device Start: 03/29/22 0749 SCDs Start: 03/28/22 1953   Antimicrobials: Ancef IV Fluid: None Consultants: Cardiology, ID Family Communication: I called and discussed with patient's niece on the phone on 1/3  Status is: Inpatient  Continue in-hospital care because: Ongoing workup.  Pending PICC line placement today. Level of care: Progressive   Dispo: The patient is from: Home              Anticipated d/c is to: Will medically stable for discharge to facility on IV antibiotics after PICC line placed              Patient currently is not medically stable to d/c.   Difficult to place patient No    Antimicrobials: Anti-infectives (From admission, onward)    Start     Dose/Rate Route Frequency Ordered Stop   04/06/22 1145  rifampin (RIFADIN) capsule 300 mg        300 mg Oral 3 times  daily 04/06/22 1057     03/30/22 1300  vancomycin (VANCOREADY) IVPB 1500 mg/300 mL  Status:  Discontinued        1,500 mg 150 mL/hr over 120 Minutes Intravenous Every 48 hours 03/28/22 1315 03/29/22 0735   03/29/22 1000  ceFAZolin (ANCEF) IVPB 2g/100 mL premix        2 g 200 mL/hr over 30 Minutes Intravenous Every 8 hours 03/29/22 0742     03/29/22 0000  metroNIDAZOLE (FLAGYL) IVPB 500 mg  Status:  Discontinued        500 mg 100 mL/hr over 60 Minutes Intravenous Every 12 hours 03/28/22 1837 03/29/22 0735   03/28/22 2200  ceFEPIme (MAXIPIME) 2 g in sodium chloride 0.9 % 100 mL IVPB  Status:  Discontinued        2 g 200 mL/hr over 30 Minutes Intravenous Every 12 hours 03/28/22 1851 03/29/22 0735   03/28/22 1430  metroNIDAZOLE (FLAGYL) IVPB 500 mg        500 mg 100 mL/hr over 60 Minutes Intravenous  Once 03/28/22 1427 03/28/22 1655   03/28/22 1130  vancomycin (VANCOREADY) IVPB 2000 mg/400 mL        2,000 mg 200 mL/hr over 120 Minutes Intravenous  Once 03/28/22 1129 03/28/22 1457   03/28/22 1045  ceFEPIme (MAXIPIME) 2 g in sodium chloride 0.9 % 100 mL IVPB        2 g 200 mL/hr over 30 Minutes Intravenous  Once 03/28/22 1042 03/28/22 1203       Objective: Vitals:   04/06/22 1059 04/06/22 1200  BP: (!) 127/58 (!) 122/57  Pulse: 100 83  Resp: 20 (!) 24  Temp:    SpO2:  95%    Intake/Output Summary (Last 24 hours) at 04/06/2022 1259 Last data filed at 04/06/2022 1253 Gross per 24 hour  Intake 1612 ml  Output 3600 ml  Net -1988 ml   Filed Weights   04/02/22 0102 04/04/22 0400 04/05/22 0500  Weight: 120.7 kg 118.9 kg 119.3 kg   Weight change:  Body mass index is 48.1 kg/m.   Physical Exam: General exam: Pleasant, elderly, not in pain Skin: No rashes, lesions or ulcers. HEENT: Atraumatic, normocephalic, no obvious bleeding Lungs:  Diminished air entry in both bases CVS: Regular rate and rhythm, no murmur GI/Abd soft, nontender, nondistended, bowel sound present CNS: Alert,  awake, oriented to place and person Psychiatry: Mood appropriate Extremities: No pedal edema, no calf tenderness  Data Review: I have personally reviewed the laboratory data and studies available.  F/u labs ordered Unresulted Labs (From admission, onward)    None       Total time spent in review of labs and imaging, patient evaluation, formulation of plan, documentation and communication with family 7 minutes  Signed, Terrilee Croak, MD [facility] 04/06/2022

## 2022-04-07 ENCOUNTER — Other Ambulatory Visit (HOSPITAL_COMMUNITY): Payer: Self-pay

## 2022-04-07 DIAGNOSIS — B9561 Methicillin susceptible Staphylococcus aureus infection as the cause of diseases classified elsewhere: Secondary | ICD-10-CM | POA: Diagnosis not present

## 2022-04-07 DIAGNOSIS — A419 Sepsis, unspecified organism: Secondary | ICD-10-CM | POA: Diagnosis not present

## 2022-04-07 DIAGNOSIS — T826XXD Infection and inflammatory reaction due to cardiac valve prosthesis, subsequent encounter: Secondary | ICD-10-CM | POA: Diagnosis not present

## 2022-04-07 DIAGNOSIS — T827XXD Infection and inflammatory reaction due to other cardiac and vascular devices, implants and grafts, subsequent encounter: Secondary | ICD-10-CM | POA: Diagnosis not present

## 2022-04-07 DIAGNOSIS — R7881 Bacteremia: Secondary | ICD-10-CM | POA: Diagnosis not present

## 2022-04-07 LAB — GLUCOSE, CAPILLARY
Glucose-Capillary: 99 mg/dL (ref 70–99)
Glucose-Capillary: 171 mg/dL — ABNORMAL HIGH (ref 70–99)
Glucose-Capillary: 99 mg/dL (ref 70–99)
Glucose-Capillary: 89 mg/dL (ref 70–99)

## 2022-04-07 MED ORDER — CHLORHEXIDINE GLUCONATE CLOTH 2 % EX PADS
6.0000 | MEDICATED_PAD | Freq: Every day | CUTANEOUS | Status: DC
  Administered 2022-04-07 – 2022-04-09 (×3): 6 via TOPICAL

## 2022-04-07 NOTE — TOC Benefit Eligibility Note (Signed)
Patient Teacher, English as a foreign language completed.    The patient is currently admitted and upon discharge could be taking enoxaparin (Lovenox) 120 mg/0.8 ml.  The current 42 day co-pay is $200.00.   The patient is insured through [employer], [place] Patient Advocate Specialist [place] Patient Advocate Team Direct Number: [phone]  Fax: [phone]

## 2022-04-07 NOTE — Progress Notes (Signed)
Peripherally Inserted Central Catheter Placement  The IV Nurse has discussed with the patient and/or persons authorized to consent for the patient, the purpose of this procedure and the potential benefits and risks involved with this procedure.  The benefits include less needle sticks, lab draws from the catheter, and the patient may be discharged home with the catheter. Risks include, but not limited to, infection, bleeding, blood clot (thrombus formation), and puncture of an artery; nerve damage and irregular heartbeat and possibility to perform a PICC exchange if needed/ordered by physician.  Alternatives to this procedure were also discussed.  Bard Power PICC patient education guide, fact sheet on infection prevention and patient information card has been provided to patient /or left at bedside.    PICC Placement Documentation  PICC Single Lumen 31/54/00 Right Basilic 39 cm 0 cm (Active)  Indication for Insertion or Continuance of Line Home intravenous therapies (PICC only) 04/07/22 1742  Exposed Catheter (cm) 0 cm 04/07/22 1742  Site Assessment Clean, Dry, Intact 04/07/22 1742  Line Status Flushed;Saline locked;Blood return noted 04/07/22 1742  Dressing Type Transparent;Securing device 04/07/22 1742  Dressing Status Antimicrobial disc in place;Clean, Dry, Intact 04/07/22 1742  Safety Lock Not Applicable 86/76/19 5093  Line Care Connections checked and tightened 04/07/22 1742  Line Adjustment (NICU/IV Team Only) No 04/07/22 1742  Dressing Intervention New dressing 04/07/22 1742  Dressing Change Due 04/14/22 04/07/22 [place] 04/07/2022, 5:46 PM

## 2022-04-07 NOTE — Progress Notes (Signed)
PROGRESS NOTE  BIRDELL FRASIER  DOB: 1933/10/15  PCP: [name], MD LTR:[id number]  DOA: 03/28/2022  LOS: 10 days  Hospital Day: 11  Brief narrative: ALYSSON GEIST is a 89 y.o. female with PMH significant for morbid obesity, DM2, HTN, HLD, A-fib/flutter on Eliquis, second-degree AV block, s/p PPM, chronic diastolic CHF, AS s/p TAVR, history of PE, GERD, hypothyroidism, osteoarthritis, anxiety/depression. 12/26, patient was brought to the ED by EMS from home for altered mental status. Per report, she had not been feeling well for few days, had decreased oral intake, nausea, vomiting and dysuria and essentially bedridden for the last few days.  In the ED, patient had a fever of 101.7, blood pressure low in 90s.  Labs showed WBC count at 13.3, lactic acid elevated to 4.7 CT abdomen pelvis was negative for any acute findings, showed possible atelectasis versus bilateral pneumonia CT head and CTA head and neck were obtained which showed questionable low-density subdural collection over the left cerebral hemisphere along with mild chronic small vessel ischemic changes. EDP discussed with neurosurgery, suspected the imaging findings were due to chronic SDH.  Recommended holding Eliquis and repeating CT head in 48 hours.  Initially admitted to [facility] Because of low blood pressure, she was given IV hydration but was limited due to pulmonary edema and new oxygen requirement.  Blood pressure remained low and hence PCCM was consulted. She was started on Levophed and upgraded to ICU. Blood culture sent on admission grew MSSA ID was consulted. 12/29, transferred out to [facility] 1/3, underwent TEE which showed 3 cm vegetation of the TAVR valve. See below for details.  Subjective: Patient was seen and examined this morning.   Lying on bed.  Not in distress per no new symptoms.  Family not at bedside.  Remains on 2 L oxygen by nasal cannula.  Pending PICC line placement.  Pending insurance  authorization for SNF  Assessment and plan: Septic shock -POA MSSA bacteremia TAVR endocarditis Presented with fever, lethargy, leukocytosis, lactic acid elevation Blood culture sent on admission grew MSSA.   1/3, TEE showed 3 cm vegetation on the TAVR valve, no vegetation seen in the native cardiac valves or pacemaker leads.  Patient was seen by CT surgery. Patient is currently not septic and has no indication for surgery except for size of vegetation.  However given her advanced age, history of CAD and other comorbidities, she would be more appropriate for medical management at this time. Last blood culture from 12/28 did not show any growth in 5 days. WBC count normal, lactic acid level improved. ID recommended continuation of IV Ancef (EOT 05/11/2022). Also started rifampin 300 mg 3 times daily. Pending PICC line placement today. Recent Labs  Lab 04/02/22 0037 04/03/22 0035 04/05/22 0044  WBC 7.4 9.8 7.6   Acute exacerbation of chronic diastolic CHF Acute respiratory failure with hypoxia Initially blood pressure low because of shock.  Required pressors.  Blood pressure improved PTA on Toprol 50 mg daily, Lasix 40 mg twice daily, Entresto 97/103 mg twice daily. Medicines were initially held because of septic shock.  Gradually resumed. Patient received aggressive IV hydration during septic shock treatment.  She had large positive balance.  Currently being diuresed with IV Lasix 40 mg twice daily.  Continue to monitor fluid balance.  Currently requiring 4 L oxygen by nasal cannula.  Wean down as tolerated Also continued on Lopressor 25 mg twice daily, Entresto 97/103 mg twice daily.  Blood pressure and renal function are stable.  Wean down oxygen as tolerated. Net IO Since Admission: 2,492.82 mL [04/07/22 1201] Continue to monitor for daily intake output, weight, blood pressure, BNP, renal function and electrolytes. Recent Labs  Lab 04/02/22 0037 04/03/22 0035 04/05/22 0044  04/06/22 0841  BUN 12 8 7* 7*  CREATININE 0.77 0.64 0.70 0.75  K 4.6 3.5 3.1* 3.4*   A-fib/flutter second-degree AV block s/p PPM Continue to monitor in telemetry on metoprolol. She was on Eliquis.  Because of the concern of interaction with rifampin, she has been switched to Lovenox subcu for the duration  Uncontrolled type 2 diabetes mellitus with hyperglycemia A1c 10 on 12/26/2021 PTA on Prandin 4 mg Premeal 3 times daily, metformin 500 mg twice daily. Both resumed.  Also on Semglee at 5 units nightly along with sliding scale insulin and Accu-Cheks. Recent Labs  Lab 04/06/22 1058 04/06/22 1615 04/06/22 2123 04/07/22 0633 04/07/22 1111  GLUCAP 198* 149* 96 99 147*   AKI Metabolic acidosis Creatinine was elevated to 1.3 on admission.  Improved with hydration.  Serum bicarb level improved as well. Recent Labs    09/22/21 1151 12/26/21 1218 03/28/22 1123 03/29/22 0522 03/30/22 0347 03/31/22 1053 04/02/22 0037 04/03/22 0035 04/05/22 0044 04/06/22 0841  BUN 21 24* '17 18 13 9 12 8 '$ 7* 7*  CREATININE 0.90 0.94 1.31* 1.10* 0.86 0.74 0.77 0.64 0.70 0.75  CO2 26 25 20* 18* '24 25 24 25 27 28   '$ Hypokalemia/hypophosphatemia Potassium level low at 3.4 yesterday.  Replacement given.  Recheck labs tomorrow. Recent Labs  Lab 04/02/22 0037 04/03/22 0035 04/05/22 0044 04/06/22 0841  K 4.6 3.5 3.1* 3.4*  PHOS  --  3.3  --   --    Acute metabolic encephalopathy Secondary to septic shock. Improved.  Presumed chronic SDH Initial CT head finding as above showing subdural collection. Repeat CT head 12/28 without changes.  No need of intervention per neurosurgery.  Hyperlipidemia PTA on Zetia 10 mg daily  Hypothyroidism Synthroid 75 mg daily  Anxiety/depression PTA on Effexor, Restoril, Ativan as needed. Add Percocet as needed today.  Impaired mobility  PT eval obtained.  SNF recommended   Goals of care   Code Status: DNR   Scheduled Meds:  enoxaparin (LOVENOX)  injection  120 mg Subcutaneous Q12H   ezetimibe  10 mg Oral Daily   furosemide  40 mg Intravenous BID   insulin aspart  0-5 Units Subcutaneous QHS   insulin aspart  0-9 Units Subcutaneous TID WC   insulin glargine-yfgn  5 Units Subcutaneous QHS   levothyroxine  75 mcg Oral Q0600   metFORMIN  500 mg Oral BID WC   metoprolol tartrate  25 mg Oral BID   repaglinide  4 mg Oral TID with meals   rifampin  300 mg Oral TID   rosuvastatin  10 mg Oral Q M,W,F-2000   sacubitril-valsartan  1 tablet Oral BID   sodium chloride flush  3 mL Intravenous Q12H   venlafaxine XR  75 mg Oral Q breakfast    PRN meds: docusate sodium, ipratropium-albuterol, loperamide, LORazepam, ondansetron **OR** ondansetron (ZOFRAN) IV, mouth rinse, oxyCODONE-acetaminophen, phenol, polyethylene glycol   Infusions:   sodium chloride 250 mL (04/06/22 1043)    ceFAZolin (ANCEF) IV 2 g (04/07/22 0850)    Skin assessment:     Nutritional status:  Body mass index is 48.1 kg/m.          Diet:  Diet Order             Diet  Carb Modified Fluid consistency: Thin; Room service appropriate? Yes  Diet effective now                   DVT prophylaxis:  Place and maintain sequential compression device Start: 03/29/22 0749 SCDs Start: 03/28/22 1953   Antimicrobials: Ancef IV Fluid: None Consultants: Cardiology, ID Family Communication: Family not at bedside today.  Her niece [name] is involved.  Status is: Inpatient  Continue in-hospital care because: Ongoing workup.  Pending PICC line placement today. Level of care: Progressive   Dispo: The patient is from: Home              Anticipated d/c is to: Will medically stable for discharge to facility on IV antibiotics after PICC line placed and insurance authorization obtained.              Patient currently is not medically stable to d/c.   Difficult to place patient No    Antimicrobials: Anti-infectives (From admission, onward)    Start     Dose/Rate  Route Frequency Ordered Stop   04/06/22 1145  rifampin (RIFADIN) capsule 300 mg        300 mg Oral 3 times daily 04/06/22 1057     03/30/22 1300  vancomycin (VANCOREADY) IVPB 1500 mg/300 mL  Status:  Discontinued        1,500 mg 150 mL/hr over 120 Minutes Intravenous Every 48 hours 03/28/22 1315 03/29/22 0735   03/29/22 1000  ceFAZolin (ANCEF) IVPB 2g/100 mL premix        2 g 200 mL/hr over 30 Minutes Intravenous Every 8 hours 03/29/22 0742     03/29/22 0000  metroNIDAZOLE (FLAGYL) IVPB 500 mg  Status:  Discontinued        500 mg 100 mL/hr over 60 Minutes Intravenous Every 12 hours 03/28/22 1837 03/29/22 0735   03/28/22 2200  ceFEPIme (MAXIPIME) 2 g in sodium chloride 0.9 % 100 mL IVPB  Status:  Discontinued        2 g 200 mL/hr over 30 Minutes Intravenous Every 12 hours 03/28/22 1851 03/29/22 0735   03/28/22 1430  metroNIDAZOLE (FLAGYL) IVPB 500 mg        500 mg 100 mL/hr over 60 Minutes Intravenous  Once 03/28/22 1427 03/28/22 1655   03/28/22 1130  vancomycin (VANCOREADY) IVPB 2000 mg/400 mL        2,000 mg 200 mL/hr over 120 Minutes Intravenous  Once 03/28/22 1129 03/28/22 1457   03/28/22 1045  ceFEPIme (MAXIPIME) 2 g in sodium chloride 0.9 % 100 mL IVPB        2 g 200 mL/hr over 30 Minutes Intravenous  Once 03/28/22 1042 03/28/22 1203       Objective: Vitals:   04/07/22 0726 04/07/22 1111  BP: (!) 117/49 (!) 118/48  Pulse: 67 64  Resp: 20 19  Temp: 97.7 F (36.5 C)   SpO2: 94% 95%    Intake/Output Summary (Last 24 hours) at 04/07/2022 1201 Last data filed at 04/07/2022 0914 Gross per 24 hour  Intake 856 ml  Output 2600 ml  Net -1744 ml   Filed Weights   04/04/22 0400 04/05/22 0500 04/07/22 0015  Weight: 118.9 kg 119.3 kg 119.3 kg   Weight change:  Body mass index is 48.1 kg/m.   Physical Exam: General exam: Pleasant, elderly, not in pain Skin: No rashes, lesions or ulcers. HEENT: Atraumatic, normocephalic, no obvious bleeding Lungs: Diminished air entry in  both bases.  No crackles CVS: Regular  rate and rhythm, no murmur GI/Abd soft, nontender, nondistended, bowel sound present CNS: Alert, awake, oriented to place and person Psychiatry: Mood appropriate Extremities: No pedal edema, no calf tenderness  Data Review: I have personally reviewed the laboratory data and studies available.  F/u labs ordered Unresulted Labs (From admission, onward)     Start     Ordered   04/08/22 6147  Basic metabolic panel  Tomorrow morning,   R       Question:  Specimen collection method  Answer:  Lab=Lab collect   04/07/22 0804   04/08/22 0500  CBC with Differential/Platelet  Tomorrow morning,   R       Question:  Specimen collection method  Answer:  Lab=Lab collect   04/07/22 0804            Total time spent in review of labs and imaging, patient evaluation, formulation of plan, documentation and communication with family 32 minutes  Signed, Terrilee Croak, MD [facility] 04/07/2022

## 2022-04-07 NOTE — TOC Progression Note (Signed)
Transition of Care [facility]) - Progression Note    Patient Details  Name: JORGINA BINNING MRN: [id number] Date of Birth: November 19, 1933  Transition of Care [facility]) CM/SW [place], LCSW Phone Number: 04/07/2022, 3:19 PM  Clinical Narrative: CSW met with pt at bedside along side family. CSW informed pt that insurance Josem Kaufmann is currently pending and [facility] will be able to take upon dc. Pt is currently waiting for PICC to be put in today. Pt requested that niece [name]) be notified of date of dc.     Expected Discharge Plan: [place] Barriers to Discharge: Continued Medical Work up  Expected Discharge Plan and [place] arrangements for the past 2 months: [place] Determinants of Health (SDOH) Interventions [place]: No Food Insecurity (03/30/2022)  Housing: Low Risk  (03/30/2022)  Transportation Needs: No Transportation Needs (03/30/2022)  Utilities: Not At Risk (03/30/2022)  Alcohol Screen: Low Risk  (07/01/2021)  Depression (PHQ2-9): Low Risk  (07/01/2021)  Financial Resource Strain: Low Risk  (07/01/2021)  Physical Activity: Inactive (07/01/2021)  Social Connections: Socially Isolated (07/01/2021)  Stress: No Stress Concern Present (07/01/2021)  Tobacco Use: Low Risk  (04/05/2022)    Readmission Risk Interventions     No data to display         [name], MSW, LCSWA, LCASA Transitions of Care  Clinical Social Worker I

## 2022-04-07 NOTE — Progress Notes (Signed)
[facility] for Infectious Disease  Date of Admission:  03/28/2022      Total days of antibiotics 9  Cefazolin 12/27 >> c  Rifampin 04/06/22 > c         ASSESSMENT: Jessica [name] is a 89 y.o. female with MSSA prosthetic valve endocarditis involving TAVR and presumed PPM involvement (though no observed vegetation on TEE).  TCTS surgery has seen and no surgical indications outside of size of vegetation (3 cm). Requested to repeat BCx to ensure she is not still bacteremic, which has already been established from 12/28 draw. She is not a candidate for valve surgery and I don't think much benefit in removing the PPM empirically as she will need attempt at chronic antibiotic suppression with oral cefadroxil.   Rifampin added 04/06/22 for synergy and attempt to hit biofilm given PVE confirmed and bacteremia cleared. She had some nausea that was relieved with zofran last night and has not recurred today thus far. Will need to make sure she has this availible to her after discharge to help ward off nausea so not to impact her intake.   OK to place order for PICC line in preparation for ambulatory IV ABX. Will continue on 6 weeks cefazolin through 05/11/2022.   Chronic SDH - holding Eliquis. She is on Lovenox injections - will need to continue these while on Rifampin to avoid DDI. Insurance verification completed ($200 for 42d supply).   Discharge Plans - looks like SNF insurance verification.    ID will sign off - please call back with any questions/concerns or if we can be of further assistance or changes in condition.  Dr. Tommy [name] is on this weekend.    PLAN: Place PICC line Continue Cefazolin IV  Continue rifampin 300 mg TID  Pre-medicate if needed with zofran 30-45 min before rifampin dose  Check LFTs next week (ordered Monday AM) Continue Lovenox injections while on Rifampin - cannot be on Eliquis + Rifampin    OPAT ORDERS:  Diagnosis: Cardiac device infection  (TAVR/PPM)  Culture Result: MSSA  Allergies  Allergen Reactions   Coreg [Carvedilol] Other (See Comments)    Weak legs   Relafen [Nabumetone] Other (See Comments)    Upset stomach   Calcium Other (See Comments)    Unknown reaction   Codeine Other (See Comments)    Unknown reaction   Lipitor [Atorvastatin] Other (See Comments)    Myalgias   Pacerone [Amiodarone] Other (See Comments)    Hand tremors   Vasotec [Enalapril] Cough   Vioxx [Rofecoxib] Other (See Comments)    Unknown reaction   Zocor [Simvastatin] Other (See Comments)    Myalgias     Discharge antibiotics to be given via PICC line:  Cefazolin 2 gm IV TID   + PO Rifampin 300 mg TID   Duration: 6 weeks    End Date: Feb 8th 2024  [place] Per Protocol with [employer] Use: Home health RN for IV administration and teaching, line care and labs.    Labs weekly while on IV antibiotics: _x_ CBC with differential _x_ CMP _x_ CRP _x_ ESR   _x_ Please pull PIC at completion of IV antibiotics __ Please leave PIC in place until doctor has seen patient or been notified  Fax weekly labs to [phone]  Clinic Follow Up Appt: @ [facility] With Jessica Madeira, NP 04/21/22 9:00 am      Principal Problem:   MSSA bacteremia Active Problems:  Hypothyroidism   DM type 2, controlled, with complication ([place])   HLD (hyperlipidemia)   OBESITY, MORBID   Anxiety disorder   Essential hypertension   Chronic diastolic heart failure (HCC)   Atrial fibrillation and flutter (HCC)   Presence of permanent cardiac pacemaker   Altered mental status   AKI (acute kidney injury) ([place])   Severe sepsis with acute organ dysfunction (HCC)   DNR (do not resuscitate)   Hypotension   Prosthetic valve endocarditis (HCC)   Pacemaker infection ([place])    enoxaparin (LOVENOX) injection  120 mg Subcutaneous Q12H   ezetimibe  10 mg Oral Daily   furosemide  40 mg Intravenous BID   insulin aspart  0-5 Units Subcutaneous QHS   insulin  aspart  0-9 Units Subcutaneous TID WC   insulin glargine-yfgn  5 Units Subcutaneous QHS   levothyroxine  75 mcg Oral Q0600   metFORMIN  500 mg Oral BID WC   metoprolol tartrate  25 mg Oral BID   repaglinide  4 mg Oral TID with meals   rifampin  300 mg Oral TID   rosuvastatin  10 mg Oral Q M,W,F-2000   sacubitril-valsartan  1 tablet Oral BID   sodium chloride flush  3 mL Intravenous Q12H   venlafaxine XR  75 mg Oral Q breakfast    SUBJECTIVE: She has a sore throat following TEE but feels a little better today than yesterday. Appetite is better.    Review of Systems: Review of Systems  Constitutional:  Negative for chills, fever, malaise/fatigue and weight loss.  HENT:  Positive for sore throat.        No dental problems  Respiratory:  Negative for cough and sputum production.   Cardiovascular:  Negative for chest pain and leg swelling.  Gastrointestinal:  Negative for abdominal pain, diarrhea and vomiting.  Genitourinary:  Negative for dysuria and flank pain.  Musculoskeletal:  Negative for joint pain, myalgias and neck pain.  Skin:  Negative for rash.  Neurological:  Negative for dizziness, tingling and headaches.  Psychiatric/Behavioral:  Negative for depression and substance abuse. The patient is not nervous/anxious and does not have insomnia.     Allergies  Allergen Reactions   Coreg [Carvedilol] Other (See Comments)    Weak legs   Relafen [Nabumetone] Other (See Comments)    Upset stomach   Calcium Other (See Comments)    Unknown reaction   Codeine Other (See Comments)    Unknown reaction   Lipitor [Atorvastatin] Other (See Comments)    Myalgias   Pacerone [Amiodarone] Other (See Comments)    Hand tremors   Vasotec [Enalapril] Cough   Vioxx [Rofecoxib] Other (See Comments)    Unknown reaction   Zocor [Simvastatin] Other (See Comments)    Myalgias    OBJECTIVE: Vitals:   04/07/22 0015 04/07/22 0427 04/07/22 0726 04/07/22 1111  BP: 121/62 (!) 135/57 (!)  117/49 (!) 118/48  Pulse:  73 67 64  Resp:  (!) '21 20 19  '$ Temp: 98 F (36.7 C) 98.7 F (37.1 C) 97.7 F (36.5 C)   TempSrc: Oral Oral Oral   SpO2:  92% 94% 95%  Weight: 119.3 kg     Height:       Body mass index is 48.1 kg/m.  Physical Exam Vitals and nursing note reviewed.  Constitutional:      Appearance: She is well-developed.  HENT:     Mouth/Throat:     Mouth: No oral lesions.     Dentition: Normal dentition.  No dental abscesses.     Pharynx: No oropharyngeal exudate.  Cardiovascular:     Rate and Rhythm: Normal rate and regular rhythm.     Heart sounds: Normal heart sounds.  Pulmonary:     Effort: Pulmonary effort is normal.     Breath sounds: Normal breath sounds.  Abdominal:     General: There is no distension.     Palpations: Abdomen is soft.     Tenderness: There is no abdominal tenderness.  Lymphadenopathy:     Cervical: No cervical adenopathy.  Skin:    General: Skin is warm and dry.     Findings: No rash.  Neurological:     Mental Status: She is alert and oriented to person, place, and time.  Psychiatric:        Judgment: Judgment normal.     Lab Results Lab Results  Component Value Date   WBC 7.6 04/05/2022   HGB 10.0 (L) 04/05/2022   HCT 31.4 (L) 04/05/2022   MCV 95.4 04/05/2022   PLT 241 04/05/2022    Lab Results  Component Value Date   CREATININE 0.75 04/06/2022   BUN 7 (L) 04/06/2022   NA 135 04/06/2022   K 3.4 (L) 04/06/2022   CL 98 04/06/2022   CO2 28 04/06/2022    Lab Results  Component Value Date   ALT 10 04/06/2022   AST 35 04/06/2022   ALKPHOS 72 04/06/2022   BILITOT 0.7 04/06/2022     Microbiology: Recent Results (from the past 240 hour(s))  Blood Culture (routine x 2)     Status: None   Collection Time: 03/28/22  3:00 PM   Specimen: BLOOD  Result Value Ref Range Status   Specimen Description BLOOD SITE NOT SPECIFIED  Final   Special Requests   Final    BOTTLES DRAWN AEROBIC AND ANAEROBIC Blood Culture adequate  volume   Culture   Final    NO GROWTH 5 DAYS Performed at [facility], [address], [place], [place] [address]    Report Status 04/02/2022 FINAL  Final  Urine Culture     Status: None   Collection Time: 03/28/22  3:18 PM   Specimen: In/Out Cath Urine  Result Value Ref Range Status   Specimen Description IN/OUT CATH URINE  Final   Special Requests NONE  Final   Culture   Final    NO GROWTH Performed at [facility], [place] [address] [place], [place] [address]    Report Status 03/29/2022 FINAL  Final  MRSA Next Gen by PCR, Nasal     Status: None   Collection Time: 03/29/22  8:31 PM   Specimen: Nasal Mucosa; Nasal Swab  Result Value Ref Range Status   MRSA by PCR Next Gen NOT DETECTED NOT DETECTED Final    Comment: (NOTE) The GeneXpert MRSA Assay (FDA approved for NASAL specimens only), is one component of a comprehensive MRSA colonization surveillance program. It is not intended to diagnose MRSA infection nor to guide or monitor treatment for MRSA infections. Test performance is not FDA approved in patients less than 12 years old. Performed at [facility], [place] [address], [place], [place] [address]   Culture, blood (Routine X 2) w Reflex to ID Panel     Status: None   Collection Time: 03/30/22  3:46 AM   Specimen: BLOOD RIGHT HAND  Result Value Ref Range Status   Specimen Description BLOOD RIGHT HAND  Final   Special Requests  Final    BOTTLES DRAWN AEROBIC AND ANAEROBIC Blood Culture adequate volume   Culture   Final    NO GROWTH 5 DAYS Performed at [facility], [place] [address] [place], [place] [address]    Report Status 04/04/2022 FINAL  Final  Culture, blood (Routine X 2) w Reflex to ID Panel     Status: None   Collection Time: 03/30/22  4:06 AM   Specimen: BLOOD RIGHT HAND  Result Value Ref Range Status   Specimen Description BLOOD RIGHT HAND  Final   Special Requests   Final    BOTTLES DRAWN AEROBIC ONLY Blood Culture adequate volume    Culture   Final    NO GROWTH 5 DAYS Performed at [facility], [place] [address], [place], [place] [address]    Report Status 04/04/2022 FINAL  Final    Jessica Madeira, MSN, NP-C [place] for Infectious Disease [facility] Pager: [phone]  '@TODAY'$ @ 12:32 PM

## 2022-04-08 DIAGNOSIS — R7881 Bacteremia: Secondary | ICD-10-CM | POA: Diagnosis not present

## 2022-04-08 DIAGNOSIS — B9561 Methicillin susceptible Staphylococcus aureus infection as the cause of diseases classified elsewhere: Secondary | ICD-10-CM | POA: Diagnosis not present

## 2022-04-08 LAB — BASIC METABOLIC PANEL
Anion gap: 11 (ref 5–15)
BUN: 8 mg/dL (ref 8–23)
CO2: 29 mmol/L (ref 22–32)
Calcium: 8.8 mg/dL — ABNORMAL LOW (ref 8.9–10.3)
Chloride: 98 mmol/L (ref 98–111)
Creatinine, Ser: 0.91 mg/dL (ref 0.44–1.00)
GFR, Estimated: >60 mL/min (ref >60)
Glucose, Bld: 83 mg/dL (ref 70–99)
Potassium: 3.5 mmol/L (ref 3.5–5.1)
Sodium: 138 mmol/L (ref 135–145)

## 2022-04-08 LAB — CBC WITH DIFFERENTIAL/PLATELET
Abs Immature Granulocytes: 0.12 10*3/uL — ABNORMAL HIGH (ref 0.00–0.07)
Basophils Absolute: 0 10*3/uL (ref 0.0–0.1)
Basophils Relative: 0 %
Eosinophils Absolute: 0 10*3/uL (ref 0.0–0.5)
Eosinophils Relative: 0 %
HCT: 31 % — ABNORMAL LOW (ref 36.0–46.0)
Hemoglobin: 9.7 g/dL — ABNORMAL LOW (ref 12.0–15.0)
Immature Granulocytes: 2 %
Lymphocytes Relative: 25 %
Lymphs Abs: 1.9 10*3/uL (ref 0.7–4.0)
MCH: 30.1 pg (ref 26.0–34.0)
MCHC: 31.3 g/dL (ref 30.0–36.0)
MCV: 96.3 fL (ref 80.0–100.0)
Monocytes Absolute: 1.5 10*3/uL — ABNORMAL HIGH (ref 0.1–1.0)
Monocytes Relative: 20 %
Neutro Abs: 4.1 10*3/uL (ref 1.7–7.7)
Neutrophils Relative %: 53 %
Platelets: 272 10*3/uL (ref 150–400)
RBC: 3.22 MIL/uL — ABNORMAL LOW (ref 3.87–5.11)
RDW: 13.9 % (ref 11.5–15.5)
WBC: 7.7 10*3/uL (ref 4.0–10.5)
nRBC: 0 % (ref 0.0–0.2)

## 2022-04-08 LAB — GLUCOSE, CAPILLARY
Glucose-Capillary: 75 mg/dL (ref 70–99)
Glucose-Capillary: 127 mg/dL — ABNORMAL HIGH (ref 70–99)
Glucose-Capillary: 77 mg/dL (ref 70–99)
Glucose-Capillary: 74 mg/dL (ref 70–99)

## 2022-04-08 MED ORDER — MELATONIN 5 MG PO TABS
10.0000 mg | ORAL_TABLET | Freq: Every evening | ORAL | Status: DC | PRN
  Administered 2022-04-08: 10 mg via ORAL
  Filled 2022-04-08: qty 2

## 2022-04-08 NOTE — Progress Notes (Addendum)
PROGRESS NOTE  Jessica [name]  DOB: Jan 04, 1934  PCP: [name], MD DGU:[id number]  DOA: 03/28/2022  LOS: 9 days  Hospital Day: 12  Brief narrative: Jessica [name] is a 89 y.o. female with PMH significant for morbid obesity, DM2, HTN, HLD, A-fib/flutter on Eliquis, second-degree AV block, s/p PPM, chronic diastolic CHF, AS s/p TAVR, history of PE, GERD, hypothyroidism, osteoarthritis, chronic anxiety/depression. 12/26, patient was brought to the ED by EMS from home for altered mental status. Per report, she had not been feeling well for few days, had decreased oral intake, nausea, vomiting and dysuria and essentially bedridden for the last few days.  In the ED, patient had a fever of 101.7, blood pressure low in 90s.  Labs showed WBC count at 13.3, lactic acid elevated to 4.7 CT abdomen pelvis was negative for any acute findings, showed possible atelectasis versus bilateral pneumonia CT head and CTA head and neck were obtained which showed questionable low-density subdural collection over the left cerebral hemisphere along with mild chronic small vessel ischemic changes. EDP discussed with neurosurgery, suspected the imaging findings were due to chronic SDH.  Recommended holding Eliquis and repeating CT head in 48 hours.  Initially admitted to [facility] Because of low blood pressure, she was given IV hydration but was limited due to pulmonary edema and new oxygen requirement.  Blood pressure remained low and hence PCCM was consulted. She was started on Levophed and upgraded to ICU. Blood culture sent on admission grew MSSA ID was consulted. 12/29, transferred out to [facility] 1/3, underwent TEE which showed 3 cm vegetation of the TAVR valve. See below for details. PICC line placed on 04/07/2022.  Subjective: Feels nauseous this morning, no emesis, no abdominal pain.  Assessment and plan: Septic shock -POA MSSA bacteremia TAVR endocarditis Presented with fever, lethargy,  leukocytosis, lactic acid elevation Blood culture sent on admission grew MSSA.   1/3, TEE showed 3 cm vegetation on the TAVR valve, no vegetation seen in the native cardiac valves or pacemaker leads.  Patient was seen by CT surgery. Patient is currently not septic and has no indication for surgery except for size of vegetation.  However given her advanced age, history of CAD and other comorbidities, she would be more appropriate for medical management at this time. Last blood culture from 12/28 did not show any growth in 5 days. WBC count normal, lactic acid level improved. ID recommended continuation of IV Ancef (EOT 05/11/2022). Also started rifampin 300 mg 3 times daily. PICC line placed on 04/07/2022. Recent Labs  Lab 04/02/22 0037 04/03/22 0035 04/05/22 0044 04/08/22 0448  WBC 7.4 9.8 7.6 7.7   Acute exacerbation of chronic diastolic CHF Acute respiratory failure with hypoxia Initially blood pressure low because of shock.  Required pressors.  Blood pressure improved PTA on Toprol 50 mg daily, Lasix 40 mg twice daily, Entresto 97/103 mg twice daily. Medicines were initially held because of septic shock.  Gradually resumed. Patient received aggressive IV hydration during septic shock treatment.  She had large positive balance.  Currently being diuresed with IV Lasix 40 mg twice daily.  Continue to monitor fluid balance.  Currently requiring 4 L oxygen by nasal cannula.  Wean down as tolerated Also continued on Lopressor 25 mg twice daily, Entresto 97/103 mg twice daily.  Blood pressure and renal function are stable. Wean down oxygen as tolerated. Net IO Since Admission: 771.82 mL [04/08/22 1123] Continue to monitor for daily intake output, weight, blood pressure, BNP, renal function and  electrolytes. Recent Labs  Lab 04/02/22 0037 04/03/22 0035 04/05/22 0044 04/06/22 0841 04/08/22 0448  BUN 12 8 7* 7* 8  CREATININE 0.77 0.64 0.70 0.75 0.91  K 4.6 3.5 3.1* 3.4* 3.5    A-fib/flutter second-degree AV block s/p PPM Continue to monitor in telemetry on metoprolol. She was on Eliquis.  Because of the concern of interaction with rifampin, she has been switched to full dose Lovenox subcu 120 mg twice daily for the duration  Uncontrolled type 2 diabetes mellitus with hyperglycemia A1c 10 on 12/26/2021 PTA on Prandin 4 mg Premeal 3 times daily, metformin 500 mg twice daily. Both resumed.  Also on Semglee at 5 units nightly along with sliding scale insulin and Accu-Cheks. Recent Labs  Lab 04/07/22 1111 04/07/22 1546 04/07/22 2308 04/08/22 0621 04/08/22 1114  GLUCAP 171* 99 89 75 127*   AKI, resolved Metabolic acidosis, resolved Creatinine was elevated to 1.3 on admission.  AKI metabolic acidosis resolved with IV hydration. Recent Labs    12/26/21 1218 03/28/22 1123 03/29/22 0522 03/30/22 0347 03/31/22 1053 04/02/22 0037 04/03/22 0035 04/05/22 0044 04/06/22 0841 04/08/22 0448  BUN 24* '17 18 13 9 12 8 '$ 7* 7* 8  CREATININE 0.94 1.31* 1.10* 0.86 0.74 0.77 0.64 0.70 0.75 0.91  CO2 25 20* 18* '24 25 24 25 27 28 29   '$ Hypokalemia/hypophosphatemia, resolved. Post replacement.  Recent Labs  Lab 04/02/22 0037 04/03/22 0035 04/05/22 0044 04/06/22 0841 04/08/22 0448  K 4.6 3.5 3.1* 3.4* 3.5  PHOS  --  3.3  --   --   --    Acute metabolic encephalopathy, resolved. Secondary to septic shock. Improved.  Nausea without abdominal pain. IV antiemetics as needed Possibly side effect from antibiotics  Presumed chronic SDH Initial CT head finding as above showing subdural collection. Repeat CT head 12/28 without changes.  No need of intervention per neurosurgery.  Hyperlipidemia PTA on Zetia 10 mg daily  Hypothyroidism Synthroid 75 mg daily  Anxiety/depression PTA on Effexor, Restoril, Ativan as needed.  Impaired mobility  PT eval obtained.  SNF recommended Mobilize as tolerated with assistance and fall precautions.   Goals of care    Code Status: DNR   Scheduled Meds:  Chlorhexidine Gluconate Cloth  6 each Topical Daily   enoxaparin (LOVENOX) injection  120 mg Subcutaneous Q12H   ezetimibe  10 mg Oral Daily   furosemide  40 mg Intravenous BID   insulin aspart  0-5 Units Subcutaneous QHS   insulin aspart  0-9 Units Subcutaneous TID WC   insulin glargine-yfgn  5 Units Subcutaneous QHS   levothyroxine  75 mcg Oral Q0600   metFORMIN  500 mg Oral BID WC   metoprolol tartrate  25 mg Oral BID   repaglinide  4 mg Oral TID with meals   rifampin  300 mg Oral TID   rosuvastatin  10 mg Oral Q M,W,F-2000   sacubitril-valsartan  1 tablet Oral BID   sodium chloride flush  3 mL Intravenous Q12H   venlafaxine XR  75 mg Oral Q breakfast    PRN meds: docusate sodium, ipratropium-albuterol, loperamide, LORazepam, ondansetron **OR** ondansetron (ZOFRAN) IV, mouth rinse, oxyCODONE-acetaminophen, phenol, polyethylene glycol   Infusions:   sodium chloride 250 mL (04/06/22 1043)    ceFAZolin (ANCEF) IV 2 g (04/08/22 0840)    Skin assessment:     Nutritional status:  Body mass index is 48.1 kg/m.       Diet:  Diet Order  Diet Carb Modified Fluid consistency: Thin; Room service appropriate? Yes  Diet effective now                   DVT prophylaxis:  Place and maintain sequential compression device Start: 03/29/22 0749 SCDs Start: 03/28/22 1953   Antimicrobials: Ancef IV Fluid: None Consultants: Cardiology, ID Family Communication: Family not at bedside today.  Her niece [name] is involved.  Status is: Inpatient  Continue in-hospital care because: Ongoing workup.  Pending PICC line placement today. Level of care: Progressive   Dispo: The patient is from: Home              Anticipated d/c is to: Will medically stable for discharge to facility on IV antibiotics after PICC line placed and insurance authorization obtained.              Patient currently is not medically stable to d/c.   Difficult to  place patient No    Antimicrobials: Anti-infectives (From admission, onward)    Start     Dose/Rate Route Frequency Ordered Stop   04/06/22 1145  rifampin (RIFADIN) capsule 300 mg        300 mg Oral 3 times daily 04/06/22 1057     03/30/22 1300  vancomycin (VANCOREADY) IVPB 1500 mg/300 mL  Status:  Discontinued        1,500 mg 150 mL/hr over 120 Minutes Intravenous Every 48 hours 03/28/22 1315 03/29/22 0735   03/29/22 1000  ceFAZolin (ANCEF) IVPB 2g/100 mL premix        2 g 200 mL/hr over 30 Minutes Intravenous Every 8 hours 03/29/22 0742     03/29/22 0000  metroNIDAZOLE (FLAGYL) IVPB 500 mg  Status:  Discontinued        500 mg 100 mL/hr over 60 Minutes Intravenous Every 12 hours 03/28/22 1837 03/29/22 0735   03/28/22 2200  ceFEPIme (MAXIPIME) 2 g in sodium chloride 0.9 % 100 mL IVPB  Status:  Discontinued        2 g 200 mL/hr over 30 Minutes Intravenous Every 12 hours 03/28/22 1851 03/29/22 0735   03/28/22 1430  metroNIDAZOLE (FLAGYL) IVPB 500 mg        500 mg 100 mL/hr over 60 Minutes Intravenous  Once 03/28/22 1427 03/28/22 1655   03/28/22 1130  vancomycin (VANCOREADY) IVPB 2000 mg/400 mL        2,000 mg 200 mL/hr over 120 Minutes Intravenous  Once 03/28/22 1129 03/28/22 1457   03/28/22 1045  ceFEPIme (MAXIPIME) 2 g in sodium chloride 0.9 % 100 mL IVPB        2 g 200 mL/hr over 30 Minutes Intravenous  Once 03/28/22 1042 03/28/22 1203       Objective: Vitals:   04/08/22 0803 04/08/22 1110  BP: (!) 124/58 128/63  Pulse: 76 66  Resp: 18 18  Temp: 97.8 F (36.6 C) 97.9 F (36.6 C)  SpO2: 93% 94%    Intake/Output Summary (Last 24 hours) at 04/08/2022 1123 Last data filed at 04/08/2022 1112 Gross per 24 hour  Intake 479 ml  Output 2200 ml  Net -1721 ml   Filed Weights   04/04/22 0400 04/05/22 0500 04/07/22 0015  Weight: 118.9 kg 119.3 kg 119.3 kg   Weight change:  Body mass index is 48.1 kg/m.   Physical Exam: General exam: frail appearing.  In no acute  distress. Skin: No rashes, lesions or ulcers. HEENT: Lungs: Diminished air entry in both bases.  No crackles CVS: Regular  rate and rhythm, no murmur GI/Abd Soft Non tender, bowel sounds present CNS: Alert, awake, oriented to place and person Psychiatry: Mood is appropriate Extremities: No pedal edema.  Data Review: I have personally reviewed the laboratory data and studies available.  F/u labs ordered Unresulted Labs (From admission, onward)     Start     Ordered   04/10/22 0500  Hepatic function panel  Once,   R       Question:  Specimen collection method  Answer:  Lab=Lab collect   04/07/22 1250            Total time spent in review of labs and imaging, patient evaluation, formulation of plan, documentation and communication with family 42 minutes  Signed, Kayleen Memos, MD [facility] 1/6/2024Pleasant elderly, frail appearing.  Pleasant, elderly,

## 2022-04-09 ENCOUNTER — Encounter (HOSPITAL_COMMUNITY): Payer: Self-pay | Admitting: Internal Medicine

## 2022-04-09 DIAGNOSIS — R58 Hemorrhage, not elsewhere classified: Secondary | ICD-10-CM | POA: Diagnosis not present

## 2022-04-09 DIAGNOSIS — I5033 Acute on chronic diastolic (congestive) heart failure: Secondary | ICD-10-CM | POA: Diagnosis not present

## 2022-04-09 DIAGNOSIS — R9431 Abnormal electrocardiogram [ECG] [EKG]: Secondary | ICD-10-CM | POA: Diagnosis not present

## 2022-04-09 DIAGNOSIS — G4700 Insomnia, unspecified: Secondary | ICD-10-CM | POA: Diagnosis not present

## 2022-04-09 DIAGNOSIS — Z1152 Encounter for screening for COVID-19: Secondary | ICD-10-CM | POA: Diagnosis not present

## 2022-04-09 DIAGNOSIS — M109 Gout, unspecified: Secondary | ICD-10-CM | POA: Diagnosis not present

## 2022-04-09 DIAGNOSIS — F03918 Unspecified dementia, unspecified severity, with other behavioral disturbance: Secondary | ICD-10-CM | POA: Diagnosis not present

## 2022-04-09 DIAGNOSIS — I5043 Acute on chronic combined systolic (congestive) and diastolic (congestive) heart failure: Secondary | ICD-10-CM | POA: Diagnosis not present

## 2022-04-09 DIAGNOSIS — E569 Vitamin deficiency, unspecified: Secondary | ICD-10-CM | POA: Diagnosis not present

## 2022-04-09 DIAGNOSIS — Z95828 Presence of other vascular implants and grafts: Secondary | ICD-10-CM | POA: Diagnosis not present

## 2022-04-09 DIAGNOSIS — I441 Atrioventricular block, second degree: Secondary | ICD-10-CM | POA: Diagnosis not present

## 2022-04-09 DIAGNOSIS — Z87898 Personal history of other specified conditions: Secondary | ICD-10-CM | POA: Diagnosis not present

## 2022-04-09 DIAGNOSIS — U071 COVID-19: Secondary | ICD-10-CM | POA: Diagnosis not present

## 2022-04-09 DIAGNOSIS — F0393 Unspecified dementia, unspecified severity, with mood disturbance: Secondary | ICD-10-CM | POA: Diagnosis not present

## 2022-04-09 DIAGNOSIS — Z952 Presence of prosthetic heart valve: Secondary | ICD-10-CM | POA: Diagnosis not present

## 2022-04-09 DIAGNOSIS — Z6841 Body Mass Index (BMI) 40.0 and over, adult: Secondary | ICD-10-CM | POA: Diagnosis not present

## 2022-04-09 DIAGNOSIS — D638 Anemia in other chronic diseases classified elsewhere: Secondary | ICD-10-CM | POA: Diagnosis not present

## 2022-04-09 DIAGNOSIS — E872 Acidosis, unspecified: Secondary | ICD-10-CM | POA: Diagnosis not present

## 2022-04-09 DIAGNOSIS — R197 Diarrhea, unspecified: Secondary | ICD-10-CM | POA: Diagnosis not present

## 2022-04-09 DIAGNOSIS — F32A Depression, unspecified: Secondary | ICD-10-CM | POA: Diagnosis not present

## 2022-04-09 DIAGNOSIS — E039 Hypothyroidism, unspecified: Secondary | ICD-10-CM | POA: Diagnosis not present

## 2022-04-09 DIAGNOSIS — E1165 Type 2 diabetes mellitus with hyperglycemia: Secondary | ICD-10-CM | POA: Diagnosis not present

## 2022-04-09 DIAGNOSIS — E860 Dehydration: Secondary | ICD-10-CM | POA: Diagnosis not present

## 2022-04-09 DIAGNOSIS — T826XXD Infection and inflammatory reaction due to cardiac valve prosthesis, subsequent encounter: Secondary | ICD-10-CM | POA: Diagnosis not present

## 2022-04-09 DIAGNOSIS — Z794 Long term (current) use of insulin: Secondary | ICD-10-CM | POA: Diagnosis not present

## 2022-04-09 DIAGNOSIS — J441 Chronic obstructive pulmonary disease with (acute) exacerbation: Secondary | ICD-10-CM | POA: Diagnosis not present

## 2022-04-09 DIAGNOSIS — Z8679 Personal history of other diseases of the circulatory system: Secondary | ICD-10-CM | POA: Diagnosis not present

## 2022-04-09 DIAGNOSIS — R410 Disorientation, unspecified: Secondary | ICD-10-CM | POA: Diagnosis not present

## 2022-04-09 DIAGNOSIS — R531 Weakness: Secondary | ICD-10-CM | POA: Diagnosis not present

## 2022-04-09 DIAGNOSIS — F419 Anxiety disorder, unspecified: Secondary | ICD-10-CM | POA: Diagnosis not present

## 2022-04-09 DIAGNOSIS — E119 Type 2 diabetes mellitus without complications: Secondary | ICD-10-CM | POA: Diagnosis not present

## 2022-04-09 DIAGNOSIS — I959 Hypotension, unspecified: Secondary | ICD-10-CM | POA: Diagnosis not present

## 2022-04-09 DIAGNOSIS — M2550 Pain in unspecified joint: Secondary | ICD-10-CM | POA: Diagnosis not present

## 2022-04-09 DIAGNOSIS — Z66 Do not resuscitate: Secondary | ICD-10-CM | POA: Diagnosis not present

## 2022-04-09 DIAGNOSIS — M6281 Muscle weakness (generalized): Secondary | ICD-10-CM | POA: Diagnosis not present

## 2022-04-09 DIAGNOSIS — I259 Chronic ischemic heart disease, unspecified: Secondary | ICD-10-CM | POA: Diagnosis not present

## 2022-04-09 DIAGNOSIS — R7881 Bacteremia: Secondary | ICD-10-CM | POA: Diagnosis not present

## 2022-04-09 DIAGNOSIS — I330 Acute and subacute infective endocarditis: Secondary | ICD-10-CM | POA: Diagnosis not present

## 2022-04-09 DIAGNOSIS — E785 Hyperlipidemia, unspecified: Secondary | ICD-10-CM | POA: Diagnosis not present

## 2022-04-09 DIAGNOSIS — E876 Hypokalemia: Secondary | ICD-10-CM | POA: Diagnosis not present

## 2022-04-09 DIAGNOSIS — R0602 Shortness of breath: Secondary | ICD-10-CM | POA: Diagnosis not present

## 2022-04-09 DIAGNOSIS — I38 Endocarditis, valve unspecified: Secondary | ICD-10-CM | POA: Diagnosis not present

## 2022-04-09 DIAGNOSIS — D62 Acute posthemorrhagic anemia: Secondary | ICD-10-CM | POA: Diagnosis not present

## 2022-04-09 DIAGNOSIS — F03911 Unspecified dementia, unspecified severity, with agitation: Secondary | ICD-10-CM | POA: Diagnosis not present

## 2022-04-09 DIAGNOSIS — J9601 Acute respiratory failure with hypoxia: Secondary | ICD-10-CM | POA: Diagnosis not present

## 2022-04-09 DIAGNOSIS — B9561 Methicillin susceptible Staphylococcus aureus infection as the cause of diseases classified elsewhere: Secondary | ICD-10-CM | POA: Diagnosis not present

## 2022-04-09 DIAGNOSIS — I350 Nonrheumatic aortic (valve) stenosis: Secondary | ICD-10-CM | POA: Diagnosis not present

## 2022-04-09 DIAGNOSIS — I10 Essential (primary) hypertension: Secondary | ICD-10-CM | POA: Diagnosis not present

## 2022-04-09 DIAGNOSIS — I5032 Chronic diastolic (congestive) heart failure: Secondary | ICD-10-CM | POA: Diagnosis not present

## 2022-04-09 DIAGNOSIS — I110 Hypertensive heart disease with heart failure: Secondary | ICD-10-CM | POA: Diagnosis not present

## 2022-04-09 DIAGNOSIS — K449 Diaphragmatic hernia without obstruction or gangrene: Secondary | ICD-10-CM | POA: Diagnosis not present

## 2022-04-09 DIAGNOSIS — F0394 Unspecified dementia, unspecified severity, with anxiety: Secondary | ICD-10-CM | POA: Diagnosis not present

## 2022-04-09 DIAGNOSIS — I4892 Unspecified atrial flutter: Secondary | ICD-10-CM | POA: Diagnosis not present

## 2022-04-09 DIAGNOSIS — R791 Abnormal coagulation profile: Secondary | ICD-10-CM | POA: Diagnosis not present

## 2022-04-09 DIAGNOSIS — E118 Type 2 diabetes mellitus with unspecified complications: Secondary | ICD-10-CM | POA: Diagnosis not present

## 2022-04-09 DIAGNOSIS — I2510 Atherosclerotic heart disease of native coronary artery without angina pectoris: Secondary | ICD-10-CM | POA: Diagnosis not present

## 2022-04-09 DIAGNOSIS — Z743 Need for continuous supervision: Secondary | ICD-10-CM | POA: Diagnosis not present

## 2022-04-09 DIAGNOSIS — I480 Paroxysmal atrial fibrillation: Secondary | ICD-10-CM | POA: Diagnosis not present

## 2022-04-09 DIAGNOSIS — F02A18 Dementia in other diseases classified elsewhere, mild, with other behavioral disturbance: Secondary | ICD-10-CM | POA: Diagnosis not present

## 2022-04-09 DIAGNOSIS — I4891 Unspecified atrial fibrillation: Secondary | ICD-10-CM | POA: Diagnosis not present

## 2022-04-09 DIAGNOSIS — N179 Acute kidney failure, unspecified: Secondary | ICD-10-CM | POA: Diagnosis not present

## 2022-04-09 DIAGNOSIS — R279 Unspecified lack of coordination: Secondary | ICD-10-CM | POA: Diagnosis not present

## 2022-04-09 DIAGNOSIS — B370 Candidal stomatitis: Secondary | ICD-10-CM | POA: Diagnosis not present

## 2022-04-09 LAB — BASIC METABOLIC PANEL
Anion gap: 10 (ref 5–15)
BUN: 9 mg/dL (ref 8–23)
CO2: 31 mmol/L (ref 22–32)
Calcium: 8.7 mg/dL — ABNORMAL LOW (ref 8.9–10.3)
Chloride: 93 mmol/L — ABNORMAL LOW (ref 98–111)
Creatinine, Ser: 0.82 mg/dL (ref 0.44–1.00)
GFR, Estimated: >60 mL/min (ref >60)
Glucose, Bld: 73 mg/dL (ref 70–99)
Potassium: 3.4 mmol/L — ABNORMAL LOW (ref 3.5–5.1)
Sodium: 134 mmol/L — ABNORMAL LOW (ref 135–145)

## 2022-04-09 LAB — GLUCOSE, CAPILLARY
Glucose-Capillary: 69 mg/dL — ABNORMAL LOW (ref 70–99)
Glucose-Capillary: 88 mg/dL (ref 70–99)
Glucose-Capillary: 152 mg/dL — ABNORMAL HIGH (ref 70–99)
Glucose-Capillary: 145 mg/dL — ABNORMAL HIGH (ref 70–99)

## 2022-04-09 LAB — MAGNESIUM: Magnesium: 1.8 mg/dL (ref 1.7–2.4)

## 2022-04-09 MED ORDER — SENNA 8.6 MG PO TABS: 1.0000 | ORAL_TABLET | Freq: Every day | ORAL | Status: DC

## 2022-04-09 MED ORDER — CEFAZOLIN IV (FOR PTA / DISCHARGE USE ONLY): 2.0000 g | Freq: Three times a day (TID) | INTRAVENOUS | 0 refills | Status: DC

## 2022-04-09 MED ORDER — LORAZEPAM 0.5 MG PO TABS: 0.5000 mg | ORAL_TABLET | Freq: Two times a day (BID) | ORAL | 0 refills | Status: DC | PRN

## 2022-04-09 MED ORDER — APIXABAN 5 MG PO TABS: 5.0000 mg | ORAL_TABLET | Freq: Two times a day (BID) | ORAL | 0 refills | Status: DC

## 2022-04-09 MED ORDER — POTASSIUM CHLORIDE CRYS ER 20 MEQ PO TBCR
40.0000 meq | EXTENDED_RELEASE_TABLET | ORAL | Status: AC
  Administered 2022-04-09: 40 meq via ORAL
  Filled 2022-04-09: qty 2

## 2022-04-09 MED ORDER — ENOXAPARIN SODIUM 120 MG/0.8ML IJ SOSY: 120.0000 mg | PREFILLED_SYRINGE | Freq: Two times a day (BID) | INTRAMUSCULAR | 0 refills | Status: DC

## 2022-04-09 MED ORDER — METOPROLOL TARTRATE 25 MG PO TABS: 25.0000 mg | ORAL_TABLET | Freq: Two times a day (BID) | ORAL | 0 refills | Status: DC

## 2022-04-09 MED ORDER — RIFAMPIN 300 MG PO CAPS: 300.0000 mg | ORAL_CAPSULE | Freq: Three times a day (TID) | ORAL | 0 refills | Status: DC

## 2022-04-09 NOTE — TOC Transition Note (Signed)
Transition of Care [facility]) - CM/SW Discharge Note   Patient Details  Name: Jessica [name] MRN: [id number] Date of Birth: 09/28/1933  [facility] CM/SW Contact:  Bary Castilla, LCSW Phone Number: 04/09/2022, 2:29 PM   Clinical Narrative:     Patient will DC to:?[facility] Discharge date:?04/09/2022 Transport by: Corey Harold   Per MD patient ready for DC to [facility]?. RN, patient, patient's family, and facility notified of DC. Discharge Summary sent to facility. RN given number for report  [phone].DC packet on chart. Ambulance transport requested for patient.   CSW signing off.   [place], [place] [phone]   Final next level of care: Skilled Nursing Facility Barriers to Discharge: Barriers Resolved   Patient Goals and CMS Choice      Discharge Placement                Patient chooses bed at:  [facility]) Patient to be transferred to facility by: [place] Name of family member notified: Niece Patient and family notified of of transfer: 04/09/22  Discharge Plan and Services Additional resources added to the After Visit Summary for                                       Social Determinants of Health (SDOH) Interventions SDOH Screenings   Food Insecurity: No Food Insecurity (03/30/2022)  Housing: Low Risk  (03/30/2022)  Transportation Needs: No Transportation Needs (03/30/2022)  Utilities: Not At Risk (03/30/2022)  Alcohol Screen: Low Risk  (07/01/2021)  Depression (PHQ2-9): Low Risk  (07/01/2021)  Financial Resource Strain: Low Risk  (07/01/2021)  Physical Activity: Inactive (07/01/2021)  Social Connections: Socially Isolated (07/01/2021)  Stress: No Stress Concern Present (07/01/2021)  Tobacco Use: Low Risk  (04/09/2022)     Readmission Risk Interventions   No data to display

## 2022-04-09 NOTE — Discharge Summary (Addendum)
Discharge Summary  Jessica [name] MOQ:[id number] DOB: 18-Nov-1933  PCP: [name], MD  Admit date: 03/28/2022 Discharge date: 04/09/2022  Time spent: 35 minutes   Recommendations for Outpatient Follow-up:  Follow-up with infectious disease. Follow-up with your primary care provider. Recommend close follow up with palliative care specialist to assist with establishing goals of care.  Discharge Diagnoses:  Active Hospital Problems   Diagnosis Date Noted   MSSA bacteremia 04/04/2022   Prosthetic valve endocarditis ([place]) 04/04/2022   Pacemaker infection ([place]) 04/04/2022   Altered mental status 03/28/2022   AKI (acute kidney injury) ([place]) 03/28/2022   Severe sepsis with acute organ dysfunction ([place]) 03/28/2022   DNR (do not resuscitate) 03/28/2022   Hypotension 03/28/2022   Presence of permanent cardiac pacemaker    Atrial fibrillation and flutter ([place]) 07/05/2017   Chronic diastolic heart failure ([place])    DM type 2, controlled, with complication ([place]) 35/46/5681   OBESITY, MORBID 07/16/2007   HLD (hyperlipidemia) 03/03/2007   Hypothyroidism 11/06/2006   Essential hypertension 11/06/2006   Anxiety disorder 11/06/2006    Resolved Hospital Problems  No resolved problems to display.    Discharge Condition: Stable  Diet recommendation: Resume previous diet, heart healthy carb modified diet.  Vitals:   04/09/22 0302 04/09/22 0758  BP: (!) 114/48 (!) 115/57  Pulse: 71 76  Resp: 19 (!) 23  Temp: 98.5 F (36.9 C) 98.8 F (37.1 C)  SpO2: 93% 95%    History of present illness:  [name] is a 89 y.o. female with PMH significant for morbid obesity, DM2, HTN, HLD, A-fib/flutter on Eliquis, second-degree AV block, s/p PPM, chronic diastolic CHF, AS s/p TAVR, history of PE, GERD, hypothyroidism, osteoarthritis, chronic anxiety/depression. 12/26, patient was brought to the ED by EMS from home for altered mental status. Per report, she had not been feeling  well for few days, had decreased oral intake, nausea, vomiting and dysuria and essentially bedridden for the last few days.   In the ED, patient had a fever of 101.7, blood pressure low in 90s.  Labs showed WBC count at 13.3, lactic acid elevated to 4.7 CT abdomen pelvis was negative for any acute findings, showed possible atelectasis versus bilateral pneumonia CT head and CTA head and neck were obtained which showed questionable low-density subdural collection over the left cerebral hemisphere along with mild chronic small vessel ischemic changes. EDP discussed with neurosurgery, suspected the imaging findings were due to chronic SDH.  Recommended holding Eliquis and repeating CT head in 48 hours.   Initially admitted to [facility] Because of low blood pressure, she was given IV hydration but was limited due to pulmonary edema and new oxygen requirement.  Blood pressure remained low and hence PCCM was consulted. She was started on Levophed and upgraded to ICU. Blood culture sent on admission grew MSSA ID was consulted. 12/29, transferred out to [facility] 1/3, underwent TEE which showed 3 cm vegetation of the TAVR valve. See below for details. PICC line placed on 04/07/2022.  Subjective: The patient was seen and examined at her bedside.  There were no acute events overnight.  She has no new complaints.  She is eager to go to rehab.    Hospital Course:  Principal Problem:   MSSA bacteremia Active Problems:   Hypothyroidism   DM type 2, controlled, with complication ([place])   HLD (hyperlipidemia)   OBESITY, MORBID   Anxiety disorder   Essential hypertension   Chronic diastolic heart failure ([facility])   Atrial fibrillation  and flutter ([place])   Presence of permanent cardiac pacemaker   Altered mental status   AKI (acute kidney injury) ([place])   Severe sepsis with acute organ dysfunction ([place])   DNR (do not resuscitate)   Hypotension   Prosthetic valve endocarditis ([place])   Pacemaker infection  ([place])  Septic shock -POA MSSA bacteremia TAVR endocarditis Presented with fever, lethargy, leukocytosis, lactic acid elevation Blood culture sent on admission grew MSSA.   1/3, TEE showed 3 cm vegetation on the TAVR valve, no vegetation seen in the native cardiac valves or pacemaker leads.  Patient was seen by CT surgery. Patient is currently not septic and has no indication for surgery except for size of vegetation.  However given her advanced age, history of CAD and other comorbidities, she would be more appropriate for medical management at this time. Last blood culture from 12/28 did not show any growth in 5 days. WBC count normal, lactic acid level improved. ID recommended continuation of IV Ancef (EOT 05/11/2022).  Also started rifampin 300 mg 3 times daily (EOT 05/13/2022). PICC line placed on 04/07/2022. Last Labs        Recent Labs  Lab 04/02/22 0037 04/03/22 0035 04/05/22 0044 04/08/22 0448  WBC 7.4 9.8 7.6 7.7      Acute exacerbation of chronic diastolic CHF Acute respiratory failure with hypoxia Initially blood pressure low because of shock.  Required pressors.  Blood pressure improved PTA on Toprol 50 mg daily, Lasix 40 mg twice daily, Entresto 97/103 mg twice daily. Medicines were initially held because of septic shock.  Gradually resumed.  Blood pressure stable. Patient received aggressive IV hydration during septic shock treatment.  She had large positive balance.  Received diuresis with IV Lasix 40 mg twice daily.   She was weaned off O2 supplementation as tolerated. Maintain O2 saturation greater than 90%. Continue Lopressor 25 mg twice daily, Entresto 97/103 mg twice daily.  Net IO Since Admission: 74.8 mL [04/09/22 1158]  Last Labs         Recent Labs  Lab 04/02/22 0037 04/03/22 0035 04/05/22 0044 04/06/22 0841 04/08/22 0448  BUN 12 8 7* 7* 8  CREATININE 0.77 0.64 0.70 0.75 0.91  K 4.6 3.5 3.1* 3.4* 3.5      A-fib/flutter second-degree AV block s/p  PPM On Lopressor for rate control. Prior to admission, she was on Eliquis.  Because of the concern of interaction with rifampin, she has been switched to full dose Lovenox subcu 120 mg twice daily for the duration. Resume home Eliquis on 05/14/2022 after completion of rifampin.   Uncontrolled type 2 diabetes mellitus with hyperglycemia A1c 10 on 12/26/2021 PTA on Prandin 4 mg Premeal 3 times daily, metformin 500 mg twice daily. Both resumed.  Also on Semglee at 5 units nightly along with sliding scale insulin and Accu-Cheks. Last Labs         Recent Labs  Lab 04/07/22 1111 04/07/22 1546 04/07/22 2308 04/08/22 0621 04/08/22 1114  GLUCAP 171* 99 89 75 127*      AKI, resolved Metabolic acidosis, resolved Creatinine was elevated to 1.3 on admission, now creatinine 0.8 with GFR greater than 60.  AKI and metabolic acidosis are resolved after IV fluid hydration. Recent Labs (within last 365 days)              Recent Labs    12/26/21 1218 03/28/22 1123 03/29/22 0522 03/30/22 0347 03/31/22 1053 04/02/22 0037 04/03/22 0035 04/05/22 0044 04/06/22 0841 04/08/22 0448  BUN 24* 17  $'18 13 9 12 8 'H$ 7* 7* 8  CREATININE 0.94 1.31* 1.10* 0.86 0.74 0.77 0.64 0.70 0.75 0.91  CO2 25 20* 18* '24 25 24 25 27 28 29      '$ Hypokalemia/hypophosphatemia Post replacement.   Last Labs         Recent Labs  Lab 04/02/22 0037 04/03/22 0035 04/05/22 0044 04/06/22 0841 04/08/22 0448  K 4.6 3.5 3.1* 3.4* 3.5  PHOS  --  3.3  --   --   --       Resolved acute metabolic encephalopathy. Secondary to septic shock.  Back to her baseline mentation.   Resolved nausea without abdominal pain. IV antiemetics as needed Possibly side effect from antibiotics   Presumed chronic SDH Initial CT head finding as above showing subdural collection. Repeat CT head 12/28 without changes.  No need of intervention per neurosurgery.   Hyperlipidemia PTA on Zetia 10 mg daily   Hypothyroidism Synthroid 75 mg  daily   Anxiety/depression PTA on Effexor, Ativan as needed.   Impaired mobility  PT eval obtained.  SNF recommended Mobilize as tolerated with assistance and fall precautions.   Goals of care Code Status: DNR  Recommend close follow up with Palliative care specialist to assist with establishing goals of care.     Procedures: TTE TEE  Consultations: Infectious disease Cardio vascular surgery Neurosurgery Critical care medicine Cardiology  Discharge Exam: BP (!) 115/57 (BP Location: Left Arm)   Pulse 76   Temp 98.8 F (37.1 C) (Axillary)   Resp (!) 23   Ht '5\' 2"'$  (1.575 m)   Wt 119.3 kg   SpO2 95%   BMI 48.10 kg/m  General: 89 y.o. year-old female well developed well nourished in no acute distress.  Alert and oriented x3. Cardiovascular: Regular rate and rhythm with no rubs or gallops.  No thyromegaly or JVD noted.   Respiratory: Clear to auscultation with no wheezes or rales. Good inspiratory effort. Abdomen: Soft nontender nondistended with normal bowel sounds x4 quadrants. Musculoskeletal: No lower extremity edema. 2/4 pulses in all 4 extremities. Skin: No ulcerative lesions noted or rashes, Psychiatry: Mood is appropriate for condition and setting  Discharge Instructions You were cared for by a hospitalist during your hospital stay. If you have any questions about your discharge medications or the care you received while you were in the hospital after you are discharged, you can call the unit and asked to speak with the hospitalist on call if the hospitalist that took care of you is not available. Once you are discharged, your primary care physician will handle any further medical issues. Please note that NO REFILLS for any discharge medications will be authorized once you are discharged, as it is imperative that you return to your primary care physician (or establish a relationship with a primary care physician if you do not have one) for your aftercare needs so that  they can reassess your need for medications and monitor your lab values.  Discharge Instructions     [facility] pharmacist to adjust dose for Vancomycin, Aminoglycosides and other anti-infective therapies as requested by physician.   Complete by: As directed    [facility] to provide Cath Flo '2mg'$    Complete by: As directed    Administer for PICC line occlusion and as ordered by physician for other access device issues.   Anaphylaxis Kit: Provided to treat any anaphylactic reaction to the medication being provided to the patient if First Dose or when requested  by physician   Complete by: As directed    Epinephrine '1mg'$ /ml vial / amp: Administer 0.'3mg'$  (0.60m) subcutaneously once for moderate to severe anaphylaxis, nurse to call physician and pharmacy when reaction occurs and call 911 if needed for immediate care   Diphenhydramine '50mg'$ /ml IV vial: Administer 25-'50mg'$  IV/IM PRN for first dose reaction, rash, itching, mild reaction, nurse to call physician and pharmacy when reaction occurs   Sodium Chloride 0.9% NS 5052mIV: Administer if needed for hypovolemic blood pressure drop or as ordered by physician after call to physician with anaphylactic reaction   Change dressing on IV access line weekly and PRN   Complete by: As directed    Flush IV access with Sodium Chloride 0.9% and Heparin 10 units/ml or 100 units/ml   Complete by: As directed    Home infusion instructions - [facility]   Complete by: As directed    Instructions: Flush IV access with Sodium Chloride 0.9% and Heparin 10units/ml or 100units/ml   Change dressing on IV access line: Weekly and PRN   Instructions Cath Flo '2mg'$ : Administer for PICC Line occlusion and as ordered by physician for other access device   [facility] pharmacist to adjust dose for: Vancomycin, Aminoglycosides and other anti-infective therapies as requested by physician   Method of administration may be changed at the  discretion of home infusion pharmacist based upon assessment of the patient and/or caregiver's ability to self-administer the medication ordered   Complete by: As directed       Allergies as of 04/09/2022       Reactions   Coreg [carvedilol] Other (See Comments)   Weak legs   Relafen [nabumetone] Other (See Comments)   Upset stomach   Calcium Other (See Comments)   Unknown reaction   Codeine Other (See Comments)   Unknown reaction   Lipitor [atorvastatin] Other (See Comments)   Myalgias   Pacerone [amiodarone] Other (See Comments)   Hand tremors   Vasotec [enalapril] Cough   Vioxx [rofecoxib] Other (See Comments)   Unknown reaction   Zocor [simvastatin] Other (See Comments)   Myalgias        Medication List     STOP taking these medications    losartan 100 MG tablet Commonly known as: COZAAR   metoprolol succinate 50 MG 24 hr tablet Commonly known as: TOPROL-XL   OVER THE COUNTER MEDICATION   OVER THE COUNTER MEDICATION   pramoxine-hydrocortisone cream Commonly known as: ANALPRAM HC   Sleep Aid (Doxylamine) 25 MG tablet Generic drug: doxylamine (Sleep)   temazepam 15 MG capsule Commonly known as: RESTORIL       TAKE these medications    allopurinol 100 MG tablet Commonly known as: ZYLOPRIM TAKE 1 TABLET BY MOUTH DAILY   apixaban 5 MG Tabs tablet Commonly known as: ELIQUIS Take 1 tablet (5 mg total) by mouth 2 (two) times daily. Start taking on: May 14, 2022 What changed: These instructions start on May 14, 2022. If you are unsure what to do until then, ask your doctor or other care provider.   b complex vitamins tablet Take 1 tablet by mouth daily.   bisacodyl 5 MG EC tablet Commonly known as: DULCOLAX Take by mouth.   CAMPHO-PHENIQUE EX   ceFAZolin  IVPB Commonly known as: ANCEF Inject 2 g into the vein every 8 (eight) hours. Indication:  MSSA PVE First Dose: Yes Last Day of Therapy:  05/11/22 Labs - Once weekly:  CBC/D and  CMP, Labs -  Every other week:  ESR and CRP Method of administration: IV Push Pull PICC line at the completion of IV antibiotics Method of administration may be changed at the discretion of home infusion pharmacist based upon assessment of the patient and/or caregiver's ability to self-administer the medication ordered.   Centrum Silver Adult 50+ Tabs Take by mouth.   Cholecalciferol 25 MCG (1000 UT) tablet Take by mouth.   docusate sodium 100 MG capsule Commonly known as: COLACE Take by mouth as needed (constipation).   ELDERBERRY PO Take by mouth.   enoxaparin 120 MG/0.8ML injection Commonly known as: LOVENOX Inject 0.8 mLs (120 mg total) into the skin every 12 (twelve) hours.   Entresto 97-103 MG Generic drug: sacubitril-valsartan Take 1 tablet by mouth 2 (two) times daily.   EQ LUBRICATING EYE DROPS OP Place 1 drop into both eyes as needed (dry eyes).   Ex-Lax Maximum Strength 25 MG Tabs Generic drug: Sennosides Take by mouth.   senna 8.6 MG Tabs tablet Commonly known as: SENOKOT Take by mouth.   ezetimibe 10 MG tablet Commonly known as: ZETIA TAKE 1 TABLET BY MOUTH DAILY   furosemide 20 MG tablet Commonly known as: LASIX Take 20 mg by mouth. What changed: Another medication with the same name was changed. Make sure you understand how and when to take each.   furosemide 40 MG tablet Commonly known as: LASIX TAKE 1 TABLET BY MOUTH TWICE  DAILY What changed: when to take this   levothyroxine 75 MCG tablet Commonly known as: SYNTHROID TAKE 1 TABLET BY MOUTH DAILY   LORazepam 0.5 MG tablet Commonly known as: ATIVAN Take 1 tablet (0.5 mg total) by mouth 2 (two) times daily as needed for up to 10 days. for anxiety What changed: reasons to take this   magnesium oxide 400 (240 Mg) MG tablet Commonly known as: MAG-OX Take 400 mg by mouth.   metFORMIN 500 MG tablet Commonly known as: GLUCOPHAGE TAKE 1 TABLET BY MOUTH TWICE  DAILY WITH MEALS   metoprolol  tartrate 25 MG tablet Commonly known as: LOPRESSOR Take 1 tablet (25 mg total) by mouth 2 (two) times daily.   potassium chloride SA 20 MEQ tablet Commonly known as: KLOR-CON M TAKE 1 TABLET BY MOUTH DAILY   repaglinide 2 MG tablet Commonly known as: PRANDIN TAKE 2 TABLETS BY MOUTH 3  TIMES DAILY BEFORE MEALS What changed: See the new instructions.   rifampin 300 MG capsule Commonly known as: Rifadin Take 1 capsule (300 mg total) by mouth 3 (three) times daily. Stop date 05/11/22   rosuvastatin 10 MG tablet Commonly known as: CRESTOR TAKE 1 TABLET BY MOUTH UP TO 3  TIMES WEEKLY AS TOLERATED What changed:  how much to take how to take this when to take this additional instructions   venlafaxine XR 75 MG 24 hr capsule Commonly known as: EFFEXOR-XR TAKE 1 CAPSULE BY MOUTH  DAILY WITH BREAKFAST What changed:  how much to take how to take this when to take this additional instructions               Discharge Care Instructions  (From admission, onward)           Start     Ordered   04/09/22 0000  Change dressing on IV access line weekly and PRN  (Home infusion instructions - [facility] )        04/09/22 1144           Allergies  Allergen  Reactions   Coreg [Carvedilol] Other (See Comments)    Weak legs   Relafen [Nabumetone] Other (See Comments)    Upset stomach   Calcium Other (See Comments)    Unknown reaction   Codeine Other (See Comments)    Unknown reaction   Lipitor [Atorvastatin] Other (See Comments)    Myalgias   Pacerone [Amiodarone] Other (See Comments)    Hand tremors   Vasotec [Enalapril] Cough   Vioxx [Rofecoxib] Other (See Comments)    Unknown reaction   Zocor [Simvastatin] Other (See Comments)    Myalgias      The results of significant diagnostics from this hospitalization (including imaging, microbiology, ancillary and laboratory) are listed below for reference.    Significant Diagnostic Studies: [place] EKG SITE  RITE  Result Date: 04/06/2022 If Site Rite image not attached, placement could not be confirmed due to current cardiac rhythm.  ECHO TEE  Result Date: 04/05/2022    TRANSESOPHOGEAL ECHO REPORT   Patient Name:   STORMI VANDEVELDE Date of Exam: 04/05/2022 Medical Rec #:  [id number]          Height:       62.0 in Accession #:    [id number]         Weight:       263.0 lb Date of Birth:  05/10/1933          BSA:          2.148 m Patient Age:    32 years           BP:           110/53 mmHg Patient Gender: F                  HR:           92 bpm. Exam Location:  Inpatient Procedure: 3D Echo, Transesophageal Echo, Cardiac Doppler and Color Doppler Indications:     Endocarditis  History:         Patient has prior history of Echocardiogram examinations, most                  recent 03/29/2022. Pacemaker and Abnormal ECG, Aortic Valve                  Disease, Arrythmias:Atrial Fibrillation,                  Signs/Symptoms:Bacteremia, Altered Mental Status, Dyspnea and                  Shortness of Breath; Risk Factors:Hypertension, Diabetes and                  Dyslipidemia. Aortic stenosis.                  Aortic Valve: 26 mm Sapien prosthetic, stented (TAVR) valve is                  present in the aortic position. Procedure Date: 01/14/2019.  Sonographer:     Roseanna Rainbow RDCS Referring Phys:  [id number] HAO MENG Diagnosing Phys: [place] PROCEDURE: After discussion of the risks and benefits of a TEE, an informed consent was obtained from the patient. The transesophogeal probe was passed without difficulty through the esophogus of the patient. Imaged were obtained with the patient in a left lateral decubitus position. Sedation performed by different physician. The patient was monitored while under deep sedation. Anesthestetic sedation was provided intravenously by Anesthesiology: '294mg'$   of Propofol, '100mg'$  of Lidocaine. The patient's vital signs; including heart rate, blood pressure, and oxygen saturation; remained stable  throughout the procedure. The patient developed no complications during the procedure.  IMPRESSIONS  1. Left ventricular ejection fraction, by estimation, is 55 to 60%. The left ventricle has normal function.  2. No vegetation seen on the device lead. Right ventricular systolic function is normal. The right ventricular size is normal.  3. No left atrial/left atrial appendage thrombus was detected.  4. Moderate pleural effusion.  5. The mitral valve is degenerative. Trivial mitral valve regurgitation. Moderate mitral annular calcification.  6. 26 mm Sapien prosthetic valve, procedure date 01/14/2019. 3 cm x 0.3 cm vegetation on the TAVR on the RCC (seen best image 81). No distinct abscess. Mild PVL. No significant rocking or dehiscence. There is a 26 mm Sapien prosthetic (TAVR) valve present in the aortic position. Procedure Date: 01/14/2019.  7. There is mild (Grade II) plaque. Conclusion(s)/Recommendation(s): 3cm vegetation seen on the TAVR prosthesis. No vegetation seen on the device lead or the native cardiac valves. FINDINGS  Left Ventricle: Left ventricular ejection fraction, by estimation, is 55 to 60%. The left ventricle has normal function. The left ventricular internal cavity size was normal in size. Right Ventricle: No vegetation seen on the device lead. The right ventricular size is normal. Right ventricular systolic function is normal. Left Atrium: No left atrial/left atrial appendage thrombus was detected. Mitral Valve: The mitral valve is degenerative in appearance. Moderate mitral annular calcification. Trivial mitral valve regurgitation. Tricuspid Valve: Tricuspid valve regurgitation is mild. Aortic Valve: 26 mm Sapien prosthetic valve, procedure date 01/14/2019. 3 cm x 0.3 cm vegetation on the TAVR on the RCC (seen best image 81). No distinct abscess. Mild PVL. No significant rocking or dehiscence. There is a 26 mm Sapien prosthetic, stented  (TAVR) valve present in the aortic position. Procedure  Date: 01/14/2019. Pulmonic Valve: Pulmonic valve regurgitation is trivial. Aorta: There is mild (Grade II) plaque. IAS/Shunts: No atrial level shunt detected by color flow Doppler. Additional Comments: A device lead is visualized. There is a moderate pleural effusion. Spectral Doppler performed. Phineas Inches Electronically signed by Phineas Inches Signature Date/Time: 04/05/2022/12:04:22 PM    Final (Updated)    CT HEAD WO CONTRAST (5MM)  Result Date: 03/30/2022 CLINICAL DATA:  Stroke follow-up EXAM: CT HEAD WITHOUT CONTRAST TECHNIQUE: Contiguous axial images were obtained from the base of the skull through the vertex without intravenous contrast. RADIATION DOSE REDUCTION: This exam was performed according to the departmental dose-optimization program which includes automated exposure control, adjustment of the mA and/or kV according to patient size and/or use of iterative reconstruction technique. COMPARISON:  None Available. FINDINGS: Brain: No acute intracranial hemorrhage. No focal mass lesion. No CT evidence of acute infarction. No midline shift or mass effect. No hydrocephalus. Basilar cisterns are patent. There are periventricular and subcortical white matter hypodensities. Generalized cortical atrophy. Prominent frontal atrophy. Vascular: No hyperdense vessel or unexpected calcification. Skull: Normal. Negative for fracture or focal lesion. Sinuses/Orbits: Paranasal sinuses and mastoid air cells are clear. Orbits are clear. Other: None. IMPRESSION: 1. No interval change from CT 03/08/2022. 2. No acute cortical infarction Electronically Signed   By: Suzy Bouchard M.D.   On: 03/30/2022 11:54   DG Chest Port 1 View  Result Date: 03/30/2022 CLINICAL DATA:  7517 with acute respiratory failure. 10027 with tachypnea. EXAM: PORTABLE CHEST 1 VIEW COMPARISON:  Portable chest 03/28/2022, chest CT 07/09/2019. FINDINGS: 3:28 a.m. A left chest dual lead pacing  system and wire insertions are unaltered. Large hiatal  hernia again noted, and old TAVR. There is aortic atherosclerosis and tortuosity with stable mediastinum. There is mild cardiomegaly. Perihilar vascular prominence is noted without overt edema. There is worsening patchy consolidation in the lower lung fields most likely due to pneumonia or aspiration. There are small pleural effusions. The mid to upper lung fields remain clear. Overall aeration is otherwise unchanged. IMPRESSION: 1. Worsening patchy consolidation in the lower lung fields most likely due to pneumonia or aspiration. Small pleural effusions. 2. Perihilar vascular prominence without overt edema. 3. Large hiatal hernia. Electronically Signed   By: Telford Nab M.D.   On: 03/30/2022 04:23   ECHOCARDIOGRAM COMPLETE  Result Date: 03/29/2022    ECHOCARDIOGRAM REPORT   Patient Name:   ELMIRE AMREIN Date of Exam: 03/29/2022 Medical Rec #:  [id number]          Height:       65.0 in Accession #:    [id number]         Weight:       240.0 lb Date of Birth:  October 12, 1933          BSA:          2.138 m Patient Age:    89 years           BP:           119/46 mmHg Patient Gender: F                  HR:           82 bpm. Exam Location:  Inpatient Procedure: 2D Echo, Cardiac Doppler, Color Doppler and Intracardiac            Opacification Agent Indications:    Congestive Heart Failure I50.9  History:        Patient has prior history of Echocardiogram examinations, most                 recent 01/07/2020. Pacemaker, Arrythmias:Atrial Flutter and                 Atrial Fibrillation; Risk Factors:Diabetes, Hypertension,                 Dyslipidemia and GERD.                 Aortic Valve: 26 mm Sapien prosthetic, stented (TAVR) valve is                 present in the aortic position. Procedure Date: 01/14/19.  Sonographer:    Bernadene Person RDCS Referring Phys: [id number] Lowella Dell REESE  Sonographer Comments: Patient is obese. IMPRESSIONS  1. The aortic valve has been repaired/replaced. There is a 26 mm Sapien  prosthetic (TAVR) valve present in the aortic position. Procedure Date: 01/14/19. Aortic valve mean gradient measures 15.0 mmHg. Aortic valve Vmax measures 2.61 m/s. EOA 1.9cm2, DI 0.5. There is mild paravalvular leak located at the 2 o'clock position on PSAX view.  2. Left ventricular ejection fraction, by estimation, is 55 to 60%. The left ventricle has normal function. Septal motion is paradoxical in the setting of RV pacing. There is moderate asymmetric left ventricular hypertrophy of the basal-septal segment. Left ventricular diastolic parameters are consistent with Grade II diastolic dysfunction (pseudonormalization).  3. Right ventricular systolic function is normal. The right ventricular size is normal. There is normal pulmonary artery systolic pressure. The estimated right ventricular systolic pressure is 09.4 mmHg.  4. Left atrial size was mildly dilated.  5. The mitral valve is degenerative. Trivial mitral valve regurgitation. Moderate mitral annular calcification.  6. Aortic dilatation noted. There is borderline dilatation of the ascending aorta, measuring 37 mm.  7. The inferior vena cava is dilated in size with >50% respiratory variability, suggesting right atrial pressure of 8 mmHg. Comparison(s): Compared to prior TTE in 2021, the mean aortic gradient is 15 from 84mHg but EOA similar at ~2cm2. FINDINGS  Left Ventricle: Left ventricular ejection fraction, by estimation, is 55 to 60%. The left ventricle has normal function. The left ventricle has no regional wall motion abnormalities. Definity contrast agent was given IV to delineate the left ventricular  endocardial borders. The left ventricular internal cavity size was normal in size. There is moderate asymmetric left ventricular hypertrophy of the basal-septal segment. Abnormal (paradoxical) septal motion, consistent with RV pacemaker. Left ventricular diastolic parameters are consistent with Grade II diastolic dysfunction (pseudonormalization).  Right Ventricle: The right ventricular size is normal. Right vetricular wall thickness was not well visualized. Right ventricular systolic function is normal. There is normal pulmonary artery systolic pressure. The tricuspid regurgitant velocity is 2.61 m/s, and with an assumed right atrial pressure of 8 mmHg, the estimated right ventricular systolic pressure is 332.3mmHg. Left Atrium: Left atrial size was mildly dilated. Right Atrium: Right atrial size was normal in size. Pericardium: There is no evidence of pericardial effusion. Mitral Valve: The mitral valve is degenerative in appearance. There is mild thickening of the mitral valve leaflet(s). There is mild calcification of the mitral valve leaflet(s). Moderate mitral annular calcification. Trivial mitral valve regurgitation. Tricuspid Valve: The tricuspid valve is normal in structure. Tricuspid valve regurgitation is mild. Aortic Valve: Mild paravalvular leak located at the 2o'clock position on PSAX view. DI 0.5. The aortic valve has been repaired/replaced. Aortic valve mean gradient measures 15.0 mmHg. Aortic valve peak gradient measures 27.2 mmHg. EOA 1.9cm2. There is a 26 mm Sapien prosthetic, stented (TAVR) valve present in the aortic position. Procedure Date: 01/14/19. Pulmonic Valve: The pulmonic valve was normal in structure. Pulmonic valve regurgitation is trivial. Aorta: Aortic dilatation noted. There is borderline dilatation of the ascending aorta, measuring 37 mm. Venous: The inferior vena cava is dilated in size with greater than 50% respiratory variability, suggesting right atrial pressure of 8 mmHg. IAS/Shunts: The atrial septum is grossly normal. Additional Comments: A device lead is visualized.  LEFT VENTRICLE PLAX 2D LVIDd:         5.20 cm     Diastology LVIDs:         3.00 cm     LV e' medial:    4.56 cm/s LV PW:         0.80 cm     LV E/e' medial:  34.2 LV IVS:        0.80 cm     LV e' lateral:   4.56 cm/s LVOT diam:     2.60 cm     LV E/e'  lateral: 34.2 LV SV:         127 LV SV Index:   60 LVOT Area:     5.31 cm  LV Volumes (MOD) LV vol d, MOD A2C: 75.8 ml LV vol d, MOD A4C: 72.9 ml LV vol s, MOD A2C: 34.8 ml LV vol s, MOD A4C: 32.8 ml LV SV MOD A2C:     41.0 ml LV SV MOD A4C:     72.9 ml LV SV MOD BP:  41.9 ml RIGHT VENTRICLE RV S prime:     14.90 cm/s TAPSE (M-mode): 1.8 cm LEFT ATRIUM             Index        RIGHT ATRIUM           Index LA diam:        5.00 cm 2.34 cm/m   RA Area:     16.90 cm LA Vol (A2C):   72.9 ml 34.11 ml/m  RA Volume:   41.40 ml  19.37 ml/m LA Vol (A4C):   81.4 ml 38.08 ml/m LA Biplane Vol: 77.1 ml 36.07 ml/m  AORTIC VALVE AV Area (Vmax):    2.56 cm AV Area (Vmean):   2.46 cm AV Area (VTI):     2.68 cm AV Vmax:           261.00 cm/s AV Vmean:          185.667 cm/s AV VTI:            0.475 m AV Peak Grad:      27.2 mmHg AV Mean Grad:      15.0 mmHg LVOT Vmax:         126.00 cm/s LVOT Vmean:        85.900 cm/s LVOT VTI:          0.240 m LVOT/AV VTI ratio: 0.50  AORTA Ao Root diam: 3.30 cm Ao Asc diam:  3.70 cm MITRAL VALVE                TRICUSPID VALVE MV Area (PHT): 3.91 cm     TR Peak grad:   27.2 mmHg MV Decel Time: 194 msec     TR Vmax:        261.00 cm/s MV E velocity: 156.00 cm/s MV A velocity: 84.60 cm/s   SHUNTS MV E/A ratio:  1.84         Systemic VTI:  0.24 m                             Systemic Diam: 2.60 cm Gwyndolyn Kaufman MD Electronically signed by Gwyndolyn Kaufman MD Signature Date/Time: 03/29/2022/11:52:14 AM    Final    CT ANGIO HEAD NECK W WO CM  Result Date: 03/28/2022 CLINICAL DATA:  Provided history: Altered mental status, septic, right lower extremity weakness. EXAM: CT ANGIOGRAPHY HEAD AND NECK TECHNIQUE: Multidetector CT imaging of the head and neck was performed using the standard protocol during bolus administration of intravenous contrast. Multiplanar CT image reconstructions and MIPs were obtained to evaluate the vascular anatomy. Carotid stenosis measurements (when  applicable) are obtained utilizing NASCET criteria, using the distal internal carotid diameter as the denominator. RADIATION DOSE REDUCTION: This exam was performed according to the departmental dose-optimization program which includes automated exposure control, adjustment of the mA and/or kV according to patient size and/or use of iterative reconstruction technique. CONTRAST:  55m OMNIPAQUE IOHEXOL 350 MG/ML SOLN COMPARISON:  Report from brain MRI 03/23/2002 (images unavailable). FINDINGS: CT HEAD FINDINGS Brain: Frontal predominant cerebral atrophy. Questionable intermediate to low-density subdural collection overlying the left cerebral hemisphere, measuring up to 6 mm in thickness (for instance as seen on series 8, image 34). No more than mild mass effect upon the underlying left cerebral hemisphere. No midline shift. Mild patchy and ill-defined hypoattenuation within the cerebral white matter, nonspecific but compatible with chronic small vessel ischemic disease. No demarcated cortical infarct. No  evidence of an intracranial mass. Vascular: No hyperdense vessel. Atherosclerotic calcifications. Skull: No fracture or aggressive osseous lesion. Sinuses/Orbits: No mass or acute finding within the imaged orbits. Trace mucosal thickening scattered within the paranasal sinuses. Other: 10 mm lucent lesion within the anterior maxilla at midline. This may reflect an incisive canal cyst or a residual cyst. Review of the MIP images confirms the above findings CTA NECK FINDINGS Aortic arch: Standard aortic branching. Atherosclerotic plaque within the visualized aortic arch and proximal major branch vessels of the neck No hemodynamically significant innominate or proximal subclavian artery stenosis. Right carotid system: CCA and ICA patent within the neck without stenosis. Mild atherosclerotic plaque within the mid to distal CCA and about the carotid bifurcation. Left carotid system: CCA and ICA patent within the neck  without hemodynamically significant stenosis (50% or greater). Atherosclerotic plaque, greatest about the carotid bifurcation and within the proximal ICA. Vertebral arteries: Vertebral arteries codominant and patent within the neck without hemodynamically significant stenosis. Nonstenotic atherosclerotic plaque at the origin of the right vertebral artery. Skeleton: Cervical spondylosis. Bilateral facet joint ankylosis at C2-C3. No acute fracture or aggressive osseous lesion. Other neck: No neck mass or cervical lymphadenopathy. Upper chest: No consolidation within the imaged lung apices. Review of the MIP images confirms the above findings CTA HEAD FINDINGS Anterior circulation: The intracranial internal carotid arteries are patent. Atherosclerotic plaque within both vessels without stenosis. The M1 middle cerebral arteries are patent. No M2 proximal branch occlusion or high-grade proximal stenosis. The anterior cerebral arteries are patent. No intracranial aneurysm is identified. Posterior circulation: The intracranial vertebral arteries are patent. Sclerotic plaque within the left V4 segment without stenosis. The basilar artery is patent. The posterior cerebral arteries are patent. A right posterior communicating artery is present. The left posterior communicating artery is diminutive or absent. Venous sinuses: Within the limitations of contrast timing, no convincing thrombus. Anatomic variants: As described. Review of the MIP images confirms the above findings CT head impression #1 called by telephone at the time of interpretation on 03/28/2022 at 2:26 pm to provider Georgina Snell , who verbally acknowledged these results. IMPRESSION: CT head: 1. Questionable intermediate to low-density subdural collection overlying the left cerebral hemisphere (measuring up to 6 mm). This may reflect artifact or a subacute-to-chronic subdural hematoma. However, given the provided history of sepsis, a subdural empyema cannot  be excluded. Consider a brain MRI without and with contrast for further evaluation, if the patient is able to have one. No more than mild mass effect upon the underlying left cerebral hemisphere. No midline shift. 2. Mild chronic small-vessel ischemic changes within the cerebral white matter. 3. Frontal predominant cerebral atrophy. CTA neck: 1. The common carotid, internal carotid and vertebral arteries are patent within the neck without hemodynamically significant stenosis. Atherosclerotic plaque within these vessels, as described. 2.  Aortic Atherosclerosis (ICD10-I70.0). CTA head: Intracranial atherosclerotic disease, as described. No intracranial large vessel occlusion or proximal high-grade arterial stenosis identified. Electronically Signed   By: Kellie Simmering D.O.   On: 03/28/2022 14:28   CT ABDOMEN PELVIS W CONTRAST  Result Date: 03/28/2022 CLINICAL DATA:  Altered mental status. Sepsis. Right lower extremity weakness. EXAM: CT ABDOMEN AND PELVIS WITH CONTRAST TECHNIQUE: Multidetector CT imaging of the abdomen and pelvis was performed using the standard protocol following bolus administration of intravenous contrast. RADIATION DOSE REDUCTION: This exam was performed according to the departmental dose-optimization program which includes automated exposure control, adjustment of the mA and/or kV according to patient size and/or use  of iterative reconstruction technique. CONTRAST:  85m OMNIPAQUE IOHEXOL 350 MG/ML SOLN COMPARISON:  01/07/2019 FINDINGS: Lower chest: Mild patchy density at the lung bases that could be atelectasis or minimal basilar pneumonia. Hiatal hernia is present. 8 mm nodule previously seen in the lingula is unchanged since October of [id number] therefore certainly benign. Hepatobiliary: Liver parenchyma is normal. Previous cholecystectomy. Pancreas: Normal Spleen: Normal Adrenals/Urinary Tract: Adrenal glands are normal. Right kidney is normal. Left kidney is normal except for a  nonobstructing 3 mm stone in the lower pole. No hydroureteronephrosis. No stone in the bladder Stomach/Bowel: Hiatal hernia as noted above. No acute gastric finding. Small-bowel is normal. No acute colon finding. Diverticulosis of the left colon without visible diverticulitis. Vascular/Lymphatic: Aortic atherosclerosis. No aneurysm. IVC is normal. No adenopathy Reproductive: No pelvic mass of significance. Small uterine leiomyomas. Other: No free fluid or air. Musculoskeletal: Ordinary spinal degenerative changes. Osteoarthritis of both hips. IMPRESSION: 1. No acute abdominal or pelvic finding. 2. Mild patchy density at the lung bases that could be atelectasis or minimal basilar pneumonia. 3. Hiatal hernia. 4. Previous cholecystectomy. 5. 3 mm nonobstructing stone in the lower pole of the left kidney. 6. Diverticulosis of the left colon without visible diverticulitis. 7. Aortic atherosclerosis. Aortic Atherosclerosis (ICD10-I70.0). Electronically Signed   By: MNelson ChimesM.D.   On: 03/28/2022 14:00   DG Chest Port 1 View  Result Date: 03/28/2022 CLINICAL DATA:  Sepsis. EXAM: PORTABLE CHEST 1 VIEW COMPARISON:  January 14, 2019. FINDINGS: Stable cardiomediastinal silhouette. Left-sided pacemaker is unchanged in position. Mild central pulmonary vascular congestion is noted. Status post transcatheter aortic valve repair. Mild bibasilar subsegmental atelectasis is noted. Bony thorax is unremarkable. IMPRESSION: Stable cardiomediastinal silhouette with probable mild central pulmonary vascular congestion. Mild bibasilar subsegmental atelectasis. Electronically Signed   By: JMarijo ConceptionM.D.   On: 03/28/2022 11:11    Microbiology: No results found for this or any previous visit (from the past 240 hour(s)).   Labs: Basic Metabolic Panel: Recent Labs  Lab 04/03/22 0035 04/05/22 0044 04/06/22 0841 04/08/22 0448 04/09/22 0603  NA 135 136 135 138 134*  K 3.5 3.1* 3.4* 3.5 3.4*  CL 100 98 98 98 93*  CO2  '25 27 28 29 31  '$ GLUCOSE 127* 108* 187* 83 73  BUN 8 7* 7* 8 9  CREATININE 0.64 0.70 0.75 0.91 0.82  CALCIUM 8.7* 8.5* 8.7* 8.8* 8.7*  MG  --   --   --   --  1.8  PHOS 3.3  --   --   --   --    Liver Function Tests: Recent Labs  Lab 04/06/22 0841  AST 35  ALT 10  ALKPHOS 72  BILITOT 0.7  PROT 7.4  ALBUMIN 2.5*   No results for input(s): "LIPASE", "AMYLASE" in the last 168 hours. No results for input(s): "AMMONIA" in the last 168 hours. CBC: Recent Labs  Lab 04/03/22 0035 04/05/22 0044 04/08/22 0448  WBC 9.8 7.6 7.7  NEUTROABS 6.2 4.8 4.1  HGB 10.1* 10.0* 9.7*  HCT 30.0* 31.4* 31.0*  MCV 92.9 95.4 96.3  PLT 176 241 272   Cardiac Enzymes: No results for input(s): "CKTOTAL", "CKMB", "CKMBINDEX", "TROPONINI" in the last 168 hours. BNP: BNP (last 3 results) Recent Labs    03/28/22 1113 03/29/22 0830 03/30/22 0347  BNP 774.3* 529.6* 471.9*    ProBNP (last 3 results) No results for input(s): "PROBNP" in the last 8760 hours.  CBG: Recent Labs  Lab 04/08/22 1640 04/08/22  2051 04/09/22 0600 04/09/22 0652 04/09/22 1148  GLUCAP 77 74 69* 88 152*       Signed:  Kayleen Memos, MD [facility] 04/09/2022, 12:57 PM

## 2022-04-09 NOTE — Progress Notes (Signed)
Mobility Specialist: Progress Note   04/09/22 1055  Mobility  Activity Transferred from bed to chair  Level of Assistance +2 (takes two people)  Assistive Device Stedy  Activity Response Tolerated well  Mobility Referral Yes  $Mobility charge 1 Mobility   Post-Mobility: 73 HR  Pt received sitting EOB with NT present in the room. Pt on 3 L/min [place] throughout. STS x3 with minA to be assisted with pericare and then transferred to the chair. C/o bilateral foot pain, otherwise asymptomatic. Pt is in the chair with NT present in the room.   [place] Mobility Specialist Please contact via SecureChat or Rehab office at [phone]

## 2022-04-09 NOTE — TOC Progression Note (Signed)
Transition of Care [facility]) - Progression Note    Patient Details  Name: Jessica [name] MRN: [id number] Date of Birth: 29-May-1933  Transition of Care [facility]) CM/SW [place], LCSW Phone Number: 04/09/2022, 9:52 AM  Clinical Narrative:     Pt's authorization was approved. Health Plan #- [id number] Authorization dates: 1/5-1/9 Fax Number: 1 [phone]    TOC team will continue to assist with discharge planning needs.   Expected Discharge Plan: [place] Barriers to Discharge: Continued Medical Work up  Expected Discharge Plan and [place] arrangements for the past 2 months: [place] Determinants of Health (SDOH) Interventions SDOH Screenings   Food Insecurity: No Food Insecurity (03/30/2022)  Housing: Low Risk  (03/30/2022)  Transportation Needs: No Transportation Needs (03/30/2022)  Utilities: Not At Risk (03/30/2022)  Alcohol Screen: Low Risk  (07/01/2021)  Depression (PHQ2-9): Low Risk  (07/01/2021)  Financial Resource Strain: Low Risk  (07/01/2021)  Physical Activity: Inactive (07/01/2021)  Social Connections: Socially Isolated (07/01/2021)  Stress: No Stress Concern Present (07/01/2021)  Tobacco Use: Low Risk  (04/05/2022)    Readmission Risk Interventions   No data to display

## 2022-04-09 NOTE — Progress Notes (Signed)
Spoke with Dr. Nevada Crane on unit regarding PT's statement about not wanting to ever come back to the hospital. Dr. Nevada Crane recommended Palliative Care to help PT and family discuss goals of care.  PT made statements this morning about not being out of bed and wanting to walk, needed reorientation that she is unable to walk at this time. Physical Therapy mobility specialist in to transfer PT from bed to chair with stedy. PT tolerated well. Was up in chair, was fine when family and visitors were at bedside, but would state that "nobody has been in my room all day" when alone. PRN Ativan and Pain medication given for anxiety and pain in her bottom.   While up in chair, PT ate lunch. Grandson and great granddaughter in to visit. When they left PT again stated nobody had been in to see her all day. Rennie Plowman came in bringing [employer] milkshake. PT drank the whole drink.  When Amos left, PT requested return to bed. Assisted back to bed using stedy, incontinence of stool care provided by NA, this nurse in room. PT rested.   PT given incontinence care again by NA, family stood outside room until complete. Family back in room, kept NA at bedside extended amount of time with statements about PT not getting mouth wash family member requested on Friday.  Other statements included "She needs Zofran or Phenergan for nausea", and probiotics for her diarrhea. NA stated she would pass the information on, but PT had not complained of nausea to NA or this nurse or used call bell.   This nurse and NA re-entered room for POC and schedule medication adminstration prior to this RN knowing about nausea. Gave scheduled Lasix. PT stated she was nauseated to this RN, RN left room to get IV Zofran. Also discussed drinking whole milkshake after not eating lunch.   When this RN entered room, with NA Nichole, PT's family again started verbalizing a list of things the PT needed or should have, this RN stated, "This is her nursing assistant, I  am the nurse, if you have any questions, you may direct them to me." PT's family member spoke in a bully tone making statements about the mouth rinse she had requested on Friday, asked why the PT had not been up walking, was disrespectful to this RN and refused to let this RN answer a question completely or respond to a statement without verbalizing another expectation. Then, PT's family member stated, "I hope [facility] has survey's they send out." This RN told her they did, they were called "[employer]". PT's family member stated, "Good." Family member had been brought in via wheelchair by female visitor, man sat looking out the window during the discourse. 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 Family member stated, "I know. I was told at 66 and here we sit." Attempted to explain that everything was prepared on our end, but we have no control over EMS and transport. Family member again cut this nurse off and started with another expectation and more statements about PT's needs and care. Also made statement about an open area on PT's bottom, PT does not currently have an open area, was sitting on pillows and weight shifted appropriately during the day.   This RN told family member that PT had been seen by physical therapy but was not walking because she was too weak and required a steady to transfer from bed  to chair and back again. Also told that PT did not have diarrhea, but loose stool.  Attempted to direct all communication with PT who stated, "I think I have had a lot of attention today." Discussed with PT that she had blueberry yogurt for breakfast and that is something that will help when taking antibiotics.  PT had Corinz mouthwash in room for mouth care.   Most of the discussion was witnessed by NA Elmyra Ricks and/or RN [place] who was not in room, but sitting right outside the door.

## 2022-04-09 NOTE — Plan of Care (Signed)
  Problem: Education: Goal: Ability to describe self-care measures that may prevent or decrease complications (Diabetes Survival Skills Education) will improve Outcome: Adequate for Discharge Goal: Individualized Educational Video(s) Outcome: Adequate for Discharge   Problem: Coping: Goal: Ability to adjust to condition or change in health will improve Outcome: Adequate for Discharge   Problem: Fluid Volume: Goal: Ability to maintain a balanced intake and output will improve Outcome: Adequate for Discharge   Problem: Health Behavior/Discharge Planning: Goal: Ability to identify and utilize available resources and services will improve Outcome: Adequate for Discharge Goal: Ability to manage health-related needs will improve Outcome: Adequate for Discharge   Problem: Metabolic: Goal: Ability to maintain appropriate glucose levels will improve Outcome: Adequate for Discharge   Problem: Nutritional: Goal: Maintenance of adequate nutrition will improve Outcome: Adequate for Discharge Goal: Progress toward achieving an optimal weight will improve Outcome: Adequate for Discharge   Problem: Skin Integrity: Goal: Risk for impaired skin integrity will decrease Outcome: Adequate for Discharge   Problem: Tissue Perfusion: Goal: Adequacy of tissue perfusion will improve Outcome: Adequate for Discharge   Problem: Fluid Volume: Goal: Hemodynamic stability will improve Outcome: Adequate for Discharge   Problem: Clinical Measurements: Goal: Diagnostic test results will improve Outcome: Adequate for Discharge Goal: Signs and symptoms of infection will decrease Outcome: Adequate for Discharge   Problem: Respiratory: Goal: Ability to maintain adequate ventilation will improve Outcome: Adequate for Discharge   Problem: Education: Goal: Knowledge of General Education information will improve Description: Including pain rating scale, medication(s)/side effects and non-pharmacologic  comfort measures Outcome: Adequate for Discharge   Problem: Health Behavior/Discharge Planning: Goal: Ability to manage health-related needs will improve Outcome: Adequate for Discharge   Problem: Clinical Measurements: Goal: Ability to maintain clinical measurements within normal limits will improve Outcome: Adequate for Discharge Goal: Will remain free from infection Outcome: Adequate for Discharge Goal: Diagnostic test results will improve Outcome: Adequate for Discharge Goal: Respiratory complications will improve Outcome: Adequate for Discharge Goal: Cardiovascular complication will be avoided Outcome: Adequate for Discharge   Problem: Activity: Goal: Risk for activity intolerance will decrease Outcome: Adequate for Discharge   Problem: Nutrition: Goal: Adequate nutrition will be maintained Outcome: Adequate for Discharge   Problem: Coping: Goal: Level of anxiety will decrease Outcome: Adequate for Discharge   Problem: Elimination: Goal: Will not experience complications related to bowel motility Outcome: Adequate for Discharge Goal: Will not experience complications related to urinary retention Outcome: Adequate for Discharge   Problem: Pain Managment: Goal: General experience of comfort will improve Outcome: Adequate for Discharge   Problem: Safety: Goal: Ability to remain free from injury will improve Outcome: Adequate for Discharge   Problem: Skin Integrity: Goal: Risk for impaired skin integrity will decrease Outcome: Adequate for Discharge

## 2022-04-09 NOTE — Significant Event (Signed)
Hypoglycemic Event  CBG: 69   Treatment: 8 oz juice/soda  Symptoms: None  Follow-up CBG: VOHC:0919 CBG Result:88   Possible Reasons for Event: Inadequate meal intake  Comments/MD notified: Protocol followed    [place]

## 2022-04-09 NOTE — TOC Progression Note (Addendum)
Transition of Care [facility]) - Progression Note    Patient Details  Name: Jessica [name] MRN: [id number] Date of Birth: [birth year]  Transition of Care [facility]) CM/SW [place], LCSW Phone Number: 04/09/2022, 11:52 AM  Clinical Narrative:     Update: 2:10PM- called Raquel Sarna and [place] and had to leave voice messages.  CSW was alerted that pt is ready for DC. CSW called Raquel Sarna and [place] at facility and awaiting a call back.  TOC team will continue to assist with discharge planning needs.   Expected Discharge Plan: [place] Barriers to Discharge: Continued Medical Work up  Expected Discharge Plan and [place] arrangements for the past 2 months: [place] Expected Discharge Date: 04/09/22                                     Social Determinants of Health (SDOH) Interventions SDOH Screenings   Food Insecurity: No Food Insecurity (03/30/2022)  Housing: Low Risk  (03/30/2022)  Transportation Needs: No Transportation Needs (03/30/2022)  Utilities: Not At Risk (03/30/2022)  Alcohol Screen: Low Risk  (07/01/2021)  Depression (PHQ2-9): Low Risk  (07/01/2021)  Financial Resource Strain: Low Risk  (07/01/2021)  Physical Activity: Inactive (07/01/2021)  Social Connections: Socially Isolated (07/01/2021)  Stress: No Stress Concern Present (07/01/2021)  Tobacco Use: Low Risk  (04/09/2022)    Readmission Risk Interventions   No data to display

## 2022-04-10 DIAGNOSIS — I10 Essential (primary) hypertension: Secondary | ICD-10-CM | POA: Diagnosis not present

## 2022-04-10 DIAGNOSIS — I5032 Chronic diastolic (congestive) heart failure: Secondary | ICD-10-CM | POA: Diagnosis not present

## 2022-04-10 DIAGNOSIS — B9561 Methicillin susceptible Staphylococcus aureus infection as the cause of diseases classified elsewhere: Secondary | ICD-10-CM | POA: Diagnosis not present

## 2022-04-10 DIAGNOSIS — E785 Hyperlipidemia, unspecified: Secondary | ICD-10-CM | POA: Diagnosis not present

## 2022-04-10 DIAGNOSIS — I350 Nonrheumatic aortic (valve) stenosis: Secondary | ICD-10-CM | POA: Diagnosis not present

## 2022-04-10 DIAGNOSIS — E119 Type 2 diabetes mellitus without complications: Secondary | ICD-10-CM | POA: Diagnosis not present

## 2022-04-10 DIAGNOSIS — I4891 Unspecified atrial fibrillation: Secondary | ICD-10-CM | POA: Diagnosis not present

## 2022-04-10 DIAGNOSIS — I38 Endocarditis, valve unspecified: Secondary | ICD-10-CM | POA: Diagnosis not present

## 2022-04-14 DIAGNOSIS — Z1152 Encounter for screening for COVID-19: Secondary | ICD-10-CM | POA: Diagnosis not present

## 2022-04-17 DIAGNOSIS — I4891 Unspecified atrial fibrillation: Secondary | ICD-10-CM | POA: Diagnosis not present

## 2022-04-17 DIAGNOSIS — E1165 Type 2 diabetes mellitus with hyperglycemia: Secondary | ICD-10-CM | POA: Diagnosis not present

## 2022-04-17 DIAGNOSIS — U071 COVID-19: Secondary | ICD-10-CM | POA: Diagnosis not present

## 2022-04-17 DIAGNOSIS — G4700 Insomnia, unspecified: Secondary | ICD-10-CM | POA: Diagnosis not present

## 2022-04-18 ENCOUNTER — Other Ambulatory Visit: Payer: Self-pay

## 2022-04-18 ENCOUNTER — Emergency Department (HOSPITAL_COMMUNITY): Payer: Medicare Other

## 2022-04-18 ENCOUNTER — Encounter (HOSPITAL_COMMUNITY): Payer: Self-pay | Admitting: Emergency Medicine

## 2022-04-18 ENCOUNTER — Inpatient Hospital Stay (HOSPITAL_COMMUNITY)
Admission: EM | Admit: 2022-04-18 | Discharge: 2022-04-24 | DRG: 682 | Disposition: A | Discharge status: Skilled Nursing Facility | Payer: Medicare Other | Source: Skilled Nursing Facility | Attending: Student | Admitting: Student

## 2022-04-18 DIAGNOSIS — F03918 Unspecified dementia, unspecified severity, with other behavioral disturbance: Secondary | ICD-10-CM | POA: Diagnosis not present

## 2022-04-18 DIAGNOSIS — R7881 Bacteremia: Secondary | ICD-10-CM | POA: Diagnosis not present

## 2022-04-18 DIAGNOSIS — N184 Chronic kidney disease, stage 4 (severe): Secondary | ICD-10-CM | POA: Diagnosis present

## 2022-04-18 DIAGNOSIS — I110 Hypertensive heart disease with heart failure: Secondary | ICD-10-CM | POA: Diagnosis present

## 2022-04-18 DIAGNOSIS — R0603 Acute respiratory distress: Secondary | ICD-10-CM | POA: Diagnosis not present

## 2022-04-18 DIAGNOSIS — R410 Disorientation, unspecified: Secondary | ICD-10-CM | POA: Diagnosis not present

## 2022-04-18 DIAGNOSIS — J441 Chronic obstructive pulmonary disease with (acute) exacerbation: Secondary | ICD-10-CM | POA: Diagnosis not present

## 2022-04-18 DIAGNOSIS — F419 Anxiety disorder, unspecified: Secondary | ICD-10-CM | POA: Diagnosis present

## 2022-04-18 DIAGNOSIS — I4892 Unspecified atrial flutter: Secondary | ICD-10-CM | POA: Diagnosis present

## 2022-04-18 DIAGNOSIS — R0602 Shortness of breath: Secondary | ICD-10-CM | POA: Diagnosis not present

## 2022-04-18 DIAGNOSIS — Z952 Presence of prosthetic heart valve: Secondary | ICD-10-CM | POA: Diagnosis not present

## 2022-04-18 DIAGNOSIS — F03911 Unspecified dementia, unspecified severity, with agitation: Secondary | ICD-10-CM | POA: Diagnosis present

## 2022-04-18 DIAGNOSIS — R4189 Other symptoms and signs involving cognitive functions and awareness: Secondary | ICD-10-CM | POA: Diagnosis present

## 2022-04-18 DIAGNOSIS — J811 Chronic pulmonary edema: Secondary | ICD-10-CM | POA: Diagnosis not present

## 2022-04-18 DIAGNOSIS — U071 COVID-19: Secondary | ICD-10-CM | POA: Diagnosis not present

## 2022-04-18 DIAGNOSIS — E039 Hypothyroidism, unspecified: Secondary | ICD-10-CM | POA: Diagnosis not present

## 2022-04-18 DIAGNOSIS — I4891 Unspecified atrial fibrillation: Secondary | ICD-10-CM | POA: Diagnosis not present

## 2022-04-18 DIAGNOSIS — M109 Gout, unspecified: Secondary | ICD-10-CM | POA: Diagnosis not present

## 2022-04-18 DIAGNOSIS — E118 Type 2 diabetes mellitus with unspecified complications: Secondary | ICD-10-CM | POA: Diagnosis present

## 2022-04-18 DIAGNOSIS — Z794 Long term (current) use of insulin: Secondary | ICD-10-CM | POA: Diagnosis not present

## 2022-04-18 DIAGNOSIS — I959 Hypotension, unspecified: Secondary | ICD-10-CM | POA: Diagnosis not present

## 2022-04-18 DIAGNOSIS — F32A Depression, unspecified: Secondary | ICD-10-CM | POA: Diagnosis not present

## 2022-04-18 DIAGNOSIS — Z8249 Family history of ischemic heart disease and other diseases of the circulatory system: Secondary | ICD-10-CM

## 2022-04-18 DIAGNOSIS — Z781 Physical restraint status: Secondary | ICD-10-CM

## 2022-04-18 DIAGNOSIS — D62 Acute posthemorrhagic anemia: Secondary | ICD-10-CM | POA: Insufficient documentation

## 2022-04-18 DIAGNOSIS — N179 Acute kidney failure, unspecified: Secondary | ICD-10-CM | POA: Diagnosis not present

## 2022-04-18 DIAGNOSIS — G4700 Insomnia, unspecified: Secondary | ICD-10-CM | POA: Diagnosis not present

## 2022-04-18 DIAGNOSIS — I5033 Acute on chronic diastolic (congestive) heart failure: Secondary | ICD-10-CM | POA: Diagnosis present

## 2022-04-18 DIAGNOSIS — F0393 Unspecified dementia, unspecified severity, with mood disturbance: Secondary | ICD-10-CM | POA: Diagnosis not present

## 2022-04-18 DIAGNOSIS — Z6841 Body Mass Index (BMI) 40.0 and over, adult: Secondary | ICD-10-CM

## 2022-04-18 DIAGNOSIS — Z7401 Bed confinement status: Secondary | ICD-10-CM | POA: Diagnosis not present

## 2022-04-18 DIAGNOSIS — R791 Abnormal coagulation profile: Secondary | ICD-10-CM | POA: Diagnosis not present

## 2022-04-18 DIAGNOSIS — Z885 Allergy status to narcotic agent status: Secondary | ICD-10-CM

## 2022-04-18 DIAGNOSIS — Z87898 Personal history of other specified conditions: Secondary | ICD-10-CM | POA: Diagnosis not present

## 2022-04-18 DIAGNOSIS — Z86711 Personal history of pulmonary embolism: Secondary | ICD-10-CM

## 2022-04-18 DIAGNOSIS — E876 Hypokalemia: Secondary | ICD-10-CM | POA: Diagnosis not present

## 2022-04-18 DIAGNOSIS — E785 Hyperlipidemia, unspecified: Secondary | ICD-10-CM | POA: Diagnosis not present

## 2022-04-18 DIAGNOSIS — R58 Hemorrhage, not elsewhere classified: Secondary | ICD-10-CM | POA: Diagnosis present

## 2022-04-18 DIAGNOSIS — Z743 Need for continuous supervision: Secondary | ICD-10-CM | POA: Diagnosis not present

## 2022-04-18 DIAGNOSIS — R279 Unspecified lack of coordination: Secondary | ICD-10-CM | POA: Diagnosis not present

## 2022-04-18 DIAGNOSIS — F0390 Unspecified dementia without behavioral disturbance: Secondary | ICD-10-CM | POA: Insufficient documentation

## 2022-04-18 DIAGNOSIS — I480 Paroxysmal atrial fibrillation: Secondary | ICD-10-CM | POA: Diagnosis present

## 2022-04-18 DIAGNOSIS — B9561 Methicillin susceptible Staphylococcus aureus infection as the cause of diseases classified elsewhere: Secondary | ICD-10-CM | POA: Diagnosis not present

## 2022-04-18 DIAGNOSIS — G629 Polyneuropathy, unspecified: Secondary | ICD-10-CM | POA: Diagnosis present

## 2022-04-18 DIAGNOSIS — R531 Weakness: Principal | ICD-10-CM

## 2022-04-18 DIAGNOSIS — E860 Dehydration: Secondary | ICD-10-CM | POA: Diagnosis not present

## 2022-04-18 DIAGNOSIS — I5032 Chronic diastolic (congestive) heart failure: Secondary | ICD-10-CM | POA: Diagnosis not present

## 2022-04-18 DIAGNOSIS — K219 Gastro-esophageal reflux disease without esophagitis: Secondary | ICD-10-CM | POA: Diagnosis present

## 2022-04-18 DIAGNOSIS — M6281 Muscle weakness (generalized): Secondary | ICD-10-CM | POA: Diagnosis not present

## 2022-04-18 DIAGNOSIS — Z888 Allergy status to other drugs, medicaments and biological substances status: Secondary | ICD-10-CM

## 2022-04-18 DIAGNOSIS — F02A18 Dementia in other diseases classified elsewhere, mild, with other behavioral disturbance: Secondary | ICD-10-CM | POA: Diagnosis not present

## 2022-04-18 DIAGNOSIS — Z95828 Presence of other vascular implants and grafts: Secondary | ICD-10-CM | POA: Diagnosis not present

## 2022-04-18 DIAGNOSIS — Z7984 Long term (current) use of oral hypoglycemic drugs: Secondary | ICD-10-CM

## 2022-04-18 DIAGNOSIS — I5043 Acute on chronic combined systolic (congestive) and diastolic (congestive) heart failure: Secondary | ICD-10-CM | POA: Diagnosis not present

## 2022-04-18 DIAGNOSIS — Z66 Do not resuscitate: Secondary | ICD-10-CM | POA: Diagnosis not present

## 2022-04-18 DIAGNOSIS — I441 Atrioventricular block, second degree: Secondary | ICD-10-CM | POA: Diagnosis not present

## 2022-04-18 DIAGNOSIS — Z950 Presence of cardiac pacemaker: Secondary | ICD-10-CM

## 2022-04-18 DIAGNOSIS — T826XXD Infection and inflammatory reaction due to cardiac valve prosthesis, subsequent encounter: Secondary | ICD-10-CM | POA: Diagnosis not present

## 2022-04-18 DIAGNOSIS — D638 Anemia in other chronic diseases classified elsewhere: Secondary | ICD-10-CM | POA: Diagnosis not present

## 2022-04-18 DIAGNOSIS — Z8679 Personal history of other diseases of the circulatory system: Secondary | ICD-10-CM

## 2022-04-18 DIAGNOSIS — R197 Diarrhea, unspecified: Secondary | ICD-10-CM | POA: Diagnosis not present

## 2022-04-18 DIAGNOSIS — Z79890 Hormone replacement therapy (postmenopausal): Secondary | ICD-10-CM

## 2022-04-18 DIAGNOSIS — I259 Chronic ischemic heart disease, unspecified: Secondary | ICD-10-CM | POA: Diagnosis not present

## 2022-04-18 DIAGNOSIS — E872 Acidosis, unspecified: Secondary | ICD-10-CM | POA: Diagnosis not present

## 2022-04-18 DIAGNOSIS — Z7901 Long term (current) use of anticoagulants: Secondary | ICD-10-CM

## 2022-04-18 DIAGNOSIS — Z79899 Other long term (current) drug therapy: Secondary | ICD-10-CM

## 2022-04-18 DIAGNOSIS — N183 Chronic kidney disease, stage 3 unspecified: Secondary | ICD-10-CM | POA: Diagnosis present

## 2022-04-18 DIAGNOSIS — Z833 Family history of diabetes mellitus: Secondary | ICD-10-CM

## 2022-04-18 DIAGNOSIS — F0394 Unspecified dementia, unspecified severity, with anxiety: Secondary | ICD-10-CM | POA: Diagnosis present

## 2022-04-18 DIAGNOSIS — R9431 Abnormal electrocardiogram [ECG] [EKG]: Secondary | ICD-10-CM | POA: Insufficient documentation

## 2022-04-18 DIAGNOSIS — E1165 Type 2 diabetes mellitus with hyperglycemia: Secondary | ICD-10-CM | POA: Diagnosis not present

## 2022-04-18 DIAGNOSIS — K449 Diaphragmatic hernia without obstruction or gangrene: Secondary | ICD-10-CM | POA: Diagnosis not present

## 2022-04-18 DIAGNOSIS — I38 Endocarditis, valve unspecified: Secondary | ICD-10-CM | POA: Diagnosis not present

## 2022-04-18 DIAGNOSIS — R404 Transient alteration of awareness: Secondary | ICD-10-CM | POA: Diagnosis not present

## 2022-04-18 DIAGNOSIS — L989 Disorder of the skin and subcutaneous tissue, unspecified: Secondary | ICD-10-CM | POA: Diagnosis present

## 2022-04-18 DIAGNOSIS — B370 Candidal stomatitis: Secondary | ICD-10-CM | POA: Diagnosis not present

## 2022-04-18 DIAGNOSIS — I330 Acute and subacute infective endocarditis: Secondary | ICD-10-CM | POA: Diagnosis not present

## 2022-04-18 DIAGNOSIS — Z823 Family history of stroke: Secondary | ICD-10-CM

## 2022-04-18 DIAGNOSIS — Z9849 Cataract extraction status, unspecified eye: Secondary | ICD-10-CM

## 2022-04-18 DIAGNOSIS — J9601 Acute respiratory failure with hypoxia: Secondary | ICD-10-CM | POA: Diagnosis not present

## 2022-04-18 DIAGNOSIS — E569 Vitamin deficiency, unspecified: Secondary | ICD-10-CM | POA: Diagnosis not present

## 2022-04-18 LAB — CBC WITH DIFFERENTIAL/PLATELET
Abs Immature Granulocytes: 0.05 10*3/uL (ref 0.00–0.07)
Basophils Absolute: 0 10*3/uL (ref 0.0–0.1)
Basophils Relative: 0 %
Eosinophils Absolute: 0.1 10*3/uL (ref 0.0–0.5)
Eosinophils Relative: 1 %
HCT: 29.2 % — ABNORMAL LOW (ref 36.0–46.0)
Hemoglobin: 9.4 g/dL — ABNORMAL LOW (ref 12.0–15.0)
Immature Granulocytes: 1 %
Lymphocytes Relative: 39 %
Lymphs Abs: 1.7 10*3/uL (ref 0.7–4.0)
MCH: 30.7 pg (ref 26.0–34.0)
MCHC: 32.2 g/dL (ref 30.0–36.0)
MCV: 95.4 fL (ref 80.0–100.0)
Monocytes Absolute: 0.7 10*3/uL (ref 0.1–1.0)
Monocytes Relative: 16 %
Neutro Abs: 1.8 10*3/uL (ref 1.7–7.7)
Neutrophils Relative %: 43 %
Platelets: 219 10*3/uL (ref 150–400)
RBC: 3.06 MIL/uL — ABNORMAL LOW (ref 3.87–5.11)
RDW: 14 % (ref 11.5–15.5)
WBC: 4.3 10*3/uL (ref 4.0–10.5)
nRBC: 0 % (ref 0.0–0.2)

## 2022-04-18 LAB — URINALYSIS, ROUTINE W REFLEX MICROSCOPIC
Bilirubin Urine: NEGATIVE
Glucose, UA: NEGATIVE mg/dL
Ketones, ur: NEGATIVE mg/dL
Nitrite: NEGATIVE
Protein, ur: 30 mg/dL — AB
Specific Gravity, Urine: 1.011 (ref 1.005–1.030)
pH: 5 (ref 5.0–8.0)

## 2022-04-18 LAB — BASIC METABOLIC PANEL
Anion gap: 11 (ref 5–15)
BUN: 35 mg/dL — ABNORMAL HIGH (ref 8–23)
CO2: 24 mmol/L (ref 22–32)
Calcium: 8.3 mg/dL — ABNORMAL LOW (ref 8.9–10.3)
Chloride: 97 mmol/L — ABNORMAL LOW (ref 98–111)
Creatinine, Ser: 2.6 mg/dL — ABNORMAL HIGH (ref 0.44–1.00)
GFR, Estimated: 17 mL/min — ABNORMAL LOW (ref >60)
Glucose, Bld: 133 mg/dL — ABNORMAL HIGH (ref 70–99)
Potassium: 3.6 mmol/L (ref 3.5–5.1)
Sodium: 132 mmol/L — ABNORMAL LOW (ref 135–145)

## 2022-04-18 LAB — CBG MONITORING, ED: Glucose-Capillary: 100 mg/dL — ABNORMAL HIGH (ref 70–99)

## 2022-04-18 LAB — RESP PANEL BY RT-PCR (RSV, FLU A&B, COVID)  RVPGX2
Influenza A by PCR: NEGATIVE
Influenza B by PCR: NEGATIVE
Resp Syncytial Virus by PCR: NEGATIVE
SARS Coronavirus 2 by RT PCR: POSITIVE — AB

## 2022-04-18 LAB — PROCALCITONIN: Procalcitonin: 0.94 ng/mL

## 2022-04-18 MED ORDER — ADULT MULTIVITAMIN W/MINERALS CH
1.0000 | ORAL_TABLET | Freq: Every day | ORAL | Status: DC
  Administered 2022-04-19 – 2022-04-24 (×6): 1 via ORAL
  Filled 2022-04-18 (×6): qty 1

## 2022-04-18 MED ORDER — RIFAMPIN 300 MG PO CAPS
300.0000 mg | ORAL_CAPSULE | Freq: Three times a day (TID) | ORAL | Status: DC
  Administered 2022-04-18 – 2022-04-19 (×3): 300 mg via ORAL
  Filled 2022-04-18 (×10): qty 1

## 2022-04-18 MED ORDER — ALLOPURINOL 100 MG PO TABS: 100.0000 mg | ORAL_TABLET | Freq: Every day | ORAL | Status: DC

## 2022-04-18 MED ORDER — ALBUTEROL SULFATE HFA 108 (90 BASE) MCG/ACT IN AERS
2.0000 | INHALATION_SPRAY | Freq: Four times a day (QID) | RESPIRATORY_TRACT | Status: DC
  Administered 2022-04-18 – 2022-04-19 (×3): 2 via RESPIRATORY_TRACT
  Filled 2022-04-18: qty 6.7

## 2022-04-18 MED ORDER — ROSUVASTATIN CALCIUM 5 MG PO TABS
10.0000 mg | ORAL_TABLET | ORAL | Status: DC
  Administered 2022-04-19 – 2022-04-24 (×4): 10 mg via ORAL
  Filled 2022-04-18 (×5): qty 2

## 2022-04-18 MED ORDER — ALBUTEROL SULFATE (2.5 MG/3ML) 0.083% IN NEBU: 2.5000 mg | INHALATION_SOLUTION | Freq: Four times a day (QID) | RESPIRATORY_TRACT | Status: DC | PRN

## 2022-04-18 MED ORDER — SODIUM CHLORIDE 0.9 % IV BOLUS
500.0000 mL | Freq: Once | INTRAVENOUS | Status: AC
  Administered 2022-04-18: 500 mL via INTRAVENOUS

## 2022-04-18 MED ORDER — ENOXAPARIN SODIUM 80 MG/0.8ML IJ SOSY
80.0000 mg | PREFILLED_SYRINGE | Freq: Two times a day (BID) | INTRAMUSCULAR | Status: DC
  Filled 2022-04-18 (×2): qty 0.8

## 2022-04-18 MED ORDER — HEPARIN (PORCINE) 25000 UT/250ML-% IV SOLN
INTRAVENOUS | Status: AC
  Administered 2022-04-18: 1100 [IU]/h via INTRAVENOUS
  Filled 2022-04-18: qty 250

## 2022-04-18 MED ORDER — METHYLPREDNISOLONE SODIUM SUCC 125 MG IJ SOLR
125.0000 mg | Freq: Once | INTRAMUSCULAR | Status: AC
  Administered 2022-04-18: 125 mg via INTRAVENOUS
  Filled 2022-04-18: qty 2

## 2022-04-18 MED ORDER — HEPARIN (PORCINE) 25000 UT/250ML-% IV SOLN: 950.0000 [IU]/h | INTRAVENOUS | Status: DC

## 2022-04-18 MED ORDER — ACETAMINOPHEN 650 MG RE SUPP: 650.0000 mg | Freq: Four times a day (QID) | RECTAL | Status: DC | PRN

## 2022-04-18 MED ORDER — LORAZEPAM 0.5 MG PO TABS
0.5000 mg | ORAL_TABLET | Freq: Two times a day (BID) | ORAL | Status: DC | PRN
  Administered 2022-04-18 – 2022-04-20 (×4): 0.5 mg via ORAL
  Filled 2022-04-18 (×4): qty 1

## 2022-04-18 MED ORDER — INSULIN ASPART 100 UNIT/ML IJ SOLN
0.0000 [IU] | Freq: Three times a day (TID) | INTRAMUSCULAR | Status: DC
  Administered 2022-04-19: 1 [IU] via SUBCUTANEOUS
  Administered 2022-04-19 – 2022-04-21 (×6): 2 [IU] via SUBCUTANEOUS
  Administered 2022-04-22 (×2): 1 [IU] via SUBCUTANEOUS
  Administered 2022-04-22: 2 [IU] via SUBCUTANEOUS
  Administered 2022-04-23 (×3): 1 [IU] via SUBCUTANEOUS

## 2022-04-18 MED ORDER — LACTATED RINGERS IV BOLUS
1000.0000 mL | Freq: Once | INTRAVENOUS | Status: AC
  Administered 2022-04-18: 1000 mL via INTRAVENOUS

## 2022-04-18 MED ORDER — ACETAMINOPHEN 325 MG PO TABS
650.0000 mg | ORAL_TABLET | Freq: Four times a day (QID) | ORAL | Status: DC | PRN
  Administered 2022-04-20 – 2022-04-22 (×2): 650 mg via ORAL
  Filled 2022-04-18 (×3): qty 2

## 2022-04-18 MED ORDER — VITAMIN C 500 MG PO TABS
500.0000 mg | ORAL_TABLET | Freq: Every day | ORAL | Status: DC
  Administered 2022-04-18 – 2022-04-24 (×6): 500 mg via ORAL
  Filled 2022-04-18 (×7): qty 1

## 2022-04-18 MED ORDER — LEVOTHYROXINE SODIUM 75 MCG PO TABS
75.0000 ug | ORAL_TABLET | Freq: Every day | ORAL | Status: DC
  Administered 2022-04-20 – 2022-04-24 (×5): 75 ug via ORAL
  Filled 2022-04-18 (×5): qty 1

## 2022-04-18 MED ORDER — CEFAZOLIN IV (FOR PTA / DISCHARGE USE ONLY): 2.0000 g | Freq: Three times a day (TID) | INTRAVENOUS | Status: DC

## 2022-04-18 MED ORDER — MAGNESIUM OXIDE -MG SUPPLEMENT 400 (240 MG) MG PO TABS
400.0000 mg | ORAL_TABLET | Freq: Every day | ORAL | Status: DC
  Administered 2022-04-19 – 2022-04-20 (×2): 400 mg via ORAL
  Filled 2022-04-18 (×2): qty 1

## 2022-04-18 MED ORDER — NYSTATIN 100000 UNIT/GM EX POWD
1.0000 | Freq: Every day | CUTANEOUS | Status: DC
  Administered 2022-04-20 – 2022-04-24 (×3): 1 via TOPICAL
  Filled 2022-04-18 (×3): qty 15

## 2022-04-18 MED ORDER — ALLOPURINOL 100 MG PO TABS
100.0000 mg | ORAL_TABLET | Freq: Every day | ORAL | Status: DC
  Administered 2022-04-19 – 2022-04-24 (×5): 100 mg via ORAL
  Filled 2022-04-18 (×6): qty 1

## 2022-04-18 MED ORDER — GUAIFENESIN-DM 100-10 MG/5ML PO SYRP
10.0000 mL | ORAL_SOLUTION | ORAL | Status: DC | PRN
  Administered 2022-04-20 – 2022-04-22 (×3): 10 mL via ORAL
  Filled 2022-04-18 (×3): qty 10

## 2022-04-18 MED ORDER — CEFAZOLIN SODIUM-DEXTROSE 2-4 GM/100ML-% IV SOLN
2.0000 g | Freq: Two times a day (BID) | INTRAVENOUS | Status: DC
  Administered 2022-04-18 – 2022-04-20 (×5): 2 g via INTRAVENOUS
  Filled 2022-04-18 (×5): qty 100

## 2022-04-18 MED ORDER — ZINC SULFATE 220 (50 ZN) MG PO CAPS
220.0000 mg | ORAL_CAPSULE | Freq: Every day | ORAL | Status: DC
  Administered 2022-04-18 – 2022-04-24 (×6): 220 mg via ORAL
  Filled 2022-04-18 (×7): qty 1

## 2022-04-18 MED ORDER — SACCHAROMYCES BOULARDII 250 MG PO CAPS
250.0000 mg | ORAL_CAPSULE | Freq: Two times a day (BID) | ORAL | Status: DC
  Administered 2022-04-18 – 2022-04-24 (×9): 250 mg via ORAL
  Filled 2022-04-18 (×13): qty 1

## 2022-04-18 MED ORDER — PREDNISONE 20 MG PO TABS
40.0000 mg | ORAL_TABLET | Freq: Every day | ORAL | Status: DC
  Administered 2022-04-19: 40 mg via ORAL
  Filled 2022-04-18: qty 2

## 2022-04-18 MED ORDER — SODIUM CHLORIDE 0.9% FLUSH
3.0000 mL | Freq: Two times a day (BID) | INTRAVENOUS | Status: DC
  Administered 2022-04-19 – 2022-04-24 (×9): 3 mL via INTRAVENOUS

## 2022-04-18 MED ORDER — SODIUM CHLORIDE 0.9 % IV SOLN: INTRAVENOUS | Status: DC

## 2022-04-18 MED ORDER — MOLNUPIRAVIR 200 MG PO CAPS: 4.0000 | ORAL_CAPSULE | Freq: Two times a day (BID) | ORAL | Status: AC

## 2022-04-18 MED ORDER — POLYVINYL ALCOHOL 1.4 % OP SOLN: 1.0000 [drp] | Freq: Three times a day (TID) | OPHTHALMIC | Status: DC | PRN

## 2022-04-18 MED ORDER — TEMAZEPAM 7.5 MG PO CAPS
7.5000 mg | ORAL_CAPSULE | Freq: Every day | ORAL | Status: DC
  Administered 2022-04-18 – 2022-04-21 (×4): 7.5 mg via ORAL
  Filled 2022-04-18 (×4): qty 1

## 2022-04-18 MED ORDER — INSULIN ASPART 100 UNIT/ML IJ SOLN: 0.0000 [IU] | Freq: Every day | INTRAMUSCULAR | Status: DC

## 2022-04-18 MED ORDER — HEPARIN SOD (PORK) LOCK FLUSH 10 UNIT/ML IV SOLN: 10.0000 [IU] | Freq: Three times a day (TID) | INTRAVENOUS | Status: DC

## 2022-04-18 NOTE — ED Notes (Signed)
Patient again screaming at staff for leaving room. Patient states she does not want to be left alone. RN informed patient that she needs to be able to see her other patient's and patient states, "I don't care about them"

## 2022-04-18 NOTE — ED Notes (Signed)
Quick clot applied to arm

## 2022-04-18 NOTE — ED Provider Triage Note (Addendum)
Emergency Medicine Provider Triage Evaluation Note  Jessica [name] , [name] 89 y.o. female  was evaluated in triage.  Patient presenting with bleeding to her left arm.  Reports it has been going on for 2 days constantly.  Not on any blood thinners.  Review of Systems  Positive:  Negative:   Physical Exam  BP (!) 87/57 (BP Location: Left Arm)   Pulse 91   Temp 97.8 F (36.6 C)   Resp 17   SpO2 96%  Gen:   Awake, no distress   Resp:  Normal effort  MSK:   Moves extremities without difficulty  Other:  Slow oozing venous bleed to the dorsal surface of the patient's right forearm.  Medical Decision Making  Medically screening exam initiated at 11:32 AM.  Appropriate orders placed.  Jessica [name] was informed that the remainder of the evaluation will be completed by another provider, this initial triage assessment does not replace that evaluation, and the importance of remaining in the ED until their evaluation is complete.    Hemostat dressing placed in triage.  Soft blood pressure, will check an H&H   Jessica Ricotta A, PA-C 04/18/22 1133   Patient continuously yelling in triage that she "cannot breathe."  She recently was diagnosed with COVID and has been audibly coughing in triage.  X-ray ordered   Jessica Hammock, PA-C 04/18/22 1230

## 2022-04-18 NOTE — ED Notes (Signed)
Patient provided with perineal care, placed in clean breif/chucks pads/and bed linen changed. Purewick placed. Pt clean dry and resting comfortably at this time

## 2022-04-18 NOTE — H&P (Signed)
History and Physical    Patient: Jessica [name] OXB:[id number] DOB: November 04, 1933 DOA: 04/18/2022 DOS: the patient was seen and examined on 04/18/2022 PCP: [name], MD  Patient coming from: [facility] via [facility]  Chief Complaint:  Chief Complaint  Patient presents with   left arm pain   HPI: Jessica [name] is a 89 y.o. female with medical history significant of diastolic congestive heart failure, atrial fibrillation/flutter, second-degree AV block  s/p PPM, aortic stenosis s/p TAVR, anxiety, depression, hypothyroidism, and GERD who presents with complaints of bleeding from left arm.  History is limited from the patient as she says she is does not recall why she is here and additional information is obtained from review of records.  She appears to have had bleeding for the last 2 days from a skin lesion of her left arm.  She complains of feeling nauseous constantly and currently states her back is hurting for which she needs to get up.  It was reported that she had poor p.o. intake and diarrhea, but no reports of vomiting.  She has had a mild nonproductive cough and has been wheezing some. Recently had been diagnosed with COVID-19 and started on molnupiravir in 1/12.  She had just recently been hospitalized from 03/28/2022-04/09/2022 with septic shock secondary to MSSA bacteremia 4 TEE showed 3 cm vegetation on TAVR valve 1/3.  Last blood culture from 12/28 did not show any growth.  She had a PICC line placed on 04/07/2022.  ID recommending continuation of Ancef through 05/11/2022 with rifampin 300 mg 3 times daily to continue until 05/13/2022.  In the emergency department patient was noted to be afebrile with blood pressures as low as 90/45.  Labs significant for hemoglobin 9.4, sodium 132, BUN 35, and creatinine 2.6.  Chest x-ray noted improved aeration of the lung bases with persistent left greater than right bibasilar linear opacities likely representing atelectasis, but difficult to  exclude residual left basilar pneumonia.  COVID-19 screening was positive.  Patient has been given 1 L lactated Ringer's.  Review of Systems: unable to review all systems due to the inability of the patient to answer questions due to her. Past Medical History:  Diagnosis Date   Anxiety    Atrial fibrillation and flutter ([place])    detected by PPM interrogation (mostly atrial flutter)   AV block, 2nd degree 10/2012   s/p MDT ADDRL1 pacemaker implantation 10/10/2012 by [name]   Chronic diastolic heart failure ([place])    Depression    GERD (gastroesophageal reflux disease)    HH (hiatus hernia)    History of pulmonary embolism 10/2008   Bilateral   Hyperlipidemia    Hypertension    Hypothyroidism    Obesity    Osteoarthritis    Peripheral neuropathy    Right leg   Presence of permanent cardiac pacemaker    Pulmonary nodule    in the lingula. Noted on pre TAVR CT. Will require follow up   Renal insufficiency 2010   S/P TAVR (transcatheter aortic valve replacement)    Shingles 09/17/2014   right lower quadrant   Type II or unspecified type diabetes mellitus without mention of complication, not stated as uncontrolled 2009   Past Surgical History:  Procedure Laterality Date   APPENDECTOMY     AV NODE ABLATION N/A 07/05/2017   Procedure: AV NODE ABLATION;  Surgeon: [name], MD;  Location: [place] [facility];  Service: Cardiovascular;  Laterality: N/A;   CARPAL TUNNEL RELEASE  CATARACT EXTRACTION     CHOLECYSTECTOMY     COLONOSCOPY  2010   COLONOSCOPY WITH PROPOFOL N/A 07/25/2021   Procedure: COLONOSCOPY WITH PROPOFOL;  Surgeon: Mauri Pole, MD;  Location: [facility];  Service: Gastroenterology;  Laterality: N/A;   FOREARM FRACTURE SURGERY  09/17/2008   HERNIA REPAIR     INGUINAL HERNIA REPAIR     PACEMAKER INSERTION  10/10/2012   MDT ADDRL1 implanted for 2nd degree AV block by [name]   PERMANENT PACEMAKER INSERTION N/A 10/10/2012   Procedure: PERMANENT  PACEMAKER INSERTION;  Surgeon: [name], MD;  Location: [facility];  Service: Cardiovascular;  Laterality: N/A;   RIGHT/LEFT HEART CATH AND CORONARY ANGIOGRAPHY N/A 12/30/2018   Procedure: RIGHT/LEFT HEART CATH AND CORONARY ANGIOGRAPHY;  Surgeon: [name], MD;  Location: [place] [facility];  Service: Cardiovascular;  Laterality: N/A;   ROTATOR CUFF REPAIR     TEE WITHOUT CARDIOVERSION N/A 01/14/2019   Procedure: TRANSESOPHAGEAL ECHOCARDIOGRAM (TEE);  Surgeon: [name], MD;  Location: [place] [facility];  Service: Open Heart Surgery;  Laterality: N/A;   TEE WITHOUT CARDIOVERSION N/A 04/05/2022   Procedure: TRANSESOPHAGEAL ECHOCARDIOGRAM (TEE);  Surgeon: [name], MD;  Location: [place];  Service: Cardiovascular;  Laterality: N/A;   TRANSCATHETER AORTIC VALVE REPLACEMENT, TRANSFEMORAL N/A 01/14/2019   Procedure: TRANSCATHETER AORTIC VALVE REPLACEMENT, TRANSFEMORAL;  Surgeon: [name], MD;  Location: [place] [facility];  Service: Open Heart Surgery;  Laterality: N/A;   Social History:  reports that she has never smoked. She has never used smokeless tobacco. She reports that she does not drink alcohol and does not use drugs.  Allergies  Allergen Reactions   Coreg [Carvedilol] Other (See Comments)    Weak legs   Relafen [Nabumetone] Other (See Comments)    Upset stomach   Calcium Other (See Comments)    Unknown reaction   Codeine Other (See Comments)    Unknown reaction   Lipitor [Atorvastatin] Other (See Comments)    Myalgias   Pacerone [Amiodarone] Other (See Comments)    Hand tremors   Vasotec [Enalapril] Cough   Vioxx [Rofecoxib] Other (See Comments)    Unknown reaction   Zocor [Simvastatin] Other (See Comments)    Myalgias    Family History  Problem Relation Age of Onset   Stroke Brother 34   Coronary artery disease Mother    Heart disease Mother 53   Stroke Father 50   Multiple myeloma Brother    Heart attack Son 74        Died of MI   Heart attack Daughter 47       Died of MI   Aneurysm Daughter 66       Question cerebral   Heart disease Maternal Grandmother    Heart disease Maternal Grandfather    Peripheral vascular disease Daughter        Amputation secondary to DM    Prior to Admission medications   Medication Sig Start Date End Date Taking? Authorizing Provider  allopurinol (ZYLOPRIM) 100 MG tablet TAKE 1 TABLET BY MOUTH DAILY Patient taking differently: Take 100 mg by mouth daily. 02/13/22   Plotnikov, Evie Lacks, MD  apixaban (ELIQUIS) 5 MG TABS tablet Take 1 tablet (5 mg total) by mouth 2 (two) times daily. 05/14/22 08/12/22  Kayleen Memos, DO  b complex vitamins tablet Take 1 tablet by mouth daily.    [provider]  bisacodyl (DULCOLAX) 5 MG EC tablet Take by  mouth.    [provider]  Camphor-Phenol (CAMPHO-PHENIQUE EX)     [provider]  Carboxymethylcellul-Glycerin (EQ LUBRICATING EYE DROPS OP) Place 1 drop into both eyes as needed (dry eyes).    [provider]  ceFAZolin (ANCEF) IVPB Inject 2 g into the vein every 8 (eight) hours. Indication:  MSSA PVE First Dose: Yes Last Day of Therapy:  05/11/22 Labs - Once weekly:  CBC/D and CMP, Labs - Every other week:  ESR and CRP Method of administration: IV Push Pull PICC line at the completion of IV antibiotics Method of administration may be changed at the discretion of home infusion pharmacist based upon assessment of the patient and/or caregiver's ability to self-administer the medication ordered. 04/09/22 05/13/22  Kayleen Memos, DO  Cholecalciferol 1000 UNITS tablet Take by mouth.    [provider]  docusate sodium (COLACE) 100 MG capsule Take by mouth as needed (constipation).    [provider]  ELDERBERRY PO Take by mouth.    [provider]  enoxaparin (LOVENOX) 120 MG/0.8ML injection Inject 0.8 mLs (120 mg total) into the skin every 12 (twelve) hours. 04/09/22 05/13/22   Kayleen Memos, DO  ezetimibe (ZETIA) 10 MG tablet TAKE 1 TABLET BY MOUTH DAILY 02/13/22   Vickie Epley, MD  furosemide (LASIX) 20 MG tablet Take 20 mg by mouth.    [provider]  furosemide (LASIX) 40 MG tablet TAKE 1 TABLET BY MOUTH TWICE  DAILY Patient taking differently: Take 40 mg by mouth 2 (two) times daily. 02/13/22   Plotnikov, Evie Lacks, MD  levothyroxine (SYNTHROID) 75 MCG tablet TAKE 1 TABLET BY MOUTH DAILY Patient taking differently: Take 75 mcg by mouth daily. 02/13/22   Plotnikov, Evie Lacks, MD  LORazepam (ATIVAN) 0.5 MG tablet Take 1 tablet (0.5 mg total) by mouth 2 (two) times daily as needed for up to 10 days. for anxiety 04/09/22 04/19/22  Kayleen Memos, DO  magnesium oxide (MAG-OX) 400 (240 Mg) MG tablet Take 400 mg by mouth.    [provider]  metFORMIN (GLUCOPHAGE) 500 MG tablet TAKE 1 TABLET BY MOUTH TWICE  DAILY WITH MEALS 02/13/22   Plotnikov, Evie Lacks, MD  metoprolol tartrate (LOPRESSOR) 25 MG tablet Take 1 tablet (25 mg total) by mouth 2 (two) times daily. 04/09/22 07/08/22  Kayleen Memos, DO  Multiple Vitamins-Minerals (CENTRUM SILVER ADULT 50+) TABS Take by mouth.    [provider]  potassium chloride SA (KLOR-CON M) 20 MEQ tablet TAKE 1 TABLET BY MOUTH DAILY Patient taking differently: Take 20 mEq by mouth daily. 02/13/22   Plotnikov, Evie Lacks, MD  repaglinide (PRANDIN) 2 MG tablet TAKE 2 TABLETS BY MOUTH 3  TIMES DAILY BEFORE MEALS Patient taking differently: Take 4 mg by mouth with breakfast, with lunch, and with evening meal. 11/07/21   Plotnikov, Evie Lacks, MD  rifampin (RIFADIN) 300 MG capsule Take 1 capsule (300 mg total) by mouth 3 (three) times daily. Stop date 05/11/22 04/09/22 05/13/22  Kayleen Memos, DO  rosuvastatin (CRESTOR) 10 MG tablet TAKE 1 TABLET BY MOUTH UP TO 3  TIMES WEEKLY AS TOLERATED Patient taking differently: Take 10 mg by mouth 3 (three) times a week. 11/14/21   Skeet Latch, MD  sacubitril-valsartan  (ENTRESTO) 97-103 MG Take 1 tablet by mouth 2 (two) times daily. 03/07/22   Skeet Latch, MD  senna (SENOKOT) 8.6 MG TABS tablet Take by mouth.    [provider]  Sennosides (EX-LAX MAXIMUM  STRENGTH) 25 MG TABS Take by mouth.    [provider]  venlafaxine XR (EFFEXOR-XR) 75 MG 24 hr capsule TAKE 1 CAPSULE BY MOUTH  DAILY WITH BREAKFAST Patient taking differently: Take 75 mg by mouth daily with breakfast. 05/02/21   Plotnikov, Evie Lacks, MD    Physical Exam: Vitals:   04/18/22 1129 04/18/22 1216 04/18/22 1407  BP: (!) 87/57 (!) 115/93 (!) 90/45  Pulse: 91  90  Resp: 17  18  Temp: 97.8 F (36.6 C)  97.9 F (36.6 C)  TempSrc:   Oral  SpO2: 96%  96%     Constitutional: Elderly female who appears to be in some discomfort Eyes: PERRL, lids and conjunctivae normal ENMT: Mucous membranes are moist. Posterior pharynx clear of any exudate or lesions.Normal dentition.  Neck: normal, supple  Respiratory: Patient with decreased overall aeration and expiratory wheezes appreciated.  O2 saturations appear to be maintained on room air at this time. Cardiovascular: Regular rate and rhythm, no murmurs / rubs / gallops. No extremity edema. 2+ pedal pulses. No carotid bruits.  Abdomen: no tenderness, no masses palpated.   Bowel sounds positive.  Musculoskeletal: no clubbing / cyanosis.  Normal muscle tone.  Skin: Poor skin turgor. Neurologic: CN 2-12 grossly intact.  Appears able to move all extremities. Psychiatric: Poor memory.  Alert and oriented to least person and place.  Anxious mood  Data Reviewed:  EKG revealed atrial sensed and ventricularly paced rhythm at 88 bpm.  Reviewed labs, imaging and pertinent records as noted above in the HPI Assessment and Plan: Acute kidney injury Patient to have creatinine elevated up to 2.6 with BUN 35.  Baseline creatinine was noted to be around 0.7-0.9 earlier this month.  She had reportedly been having diarrhea with decreased p.o.  intake, medications(including diuretics and Entresto), and hypotension as all possible contributors. -Admit to a progressive bed -Strict I&O's and daily weights -Avoid nephrotoxic agents -Check urinalysis   -Continue IV fluids -Continue to monitor kidney function daily.  Hypotension Acute.  Initial blood pressures noted to be as low as 90/45.  Home blood pressure regimen includes furosemide 60 mg every morning, furosemide 40 mg in afternoon, metoprolol 25 mg twice daily, patient appears to be acutely dehydrated. -Hold home blood pressure medications due to AKI and hypotension -Bolus IV fluids as needed to maintain maps greater than 65  MSSA bacteremia TAVR endocarditis Prior to arrival.  Blood cultures during last hospitalization grew out MSSA.  Patient underwent TEE which showed 3 cm vegetation on the TAVR valve.  Patient had been seen by CT surgery, but medical management was recommended.  Blood cultures from 12/28 had not noted any signs of growth.  PICC line was placed on 04/07/2022 and ID had gave recommendations as seen below. -Continue Ancef through 05/11/2022 with rifampin 300 mg 3 times daily to continue until 05/13/2022.  COVID-19 infection Prior to arrival.  Patient noted to be positive for COVID-19 at the nursing facility prior to arrival.  When last checked on 03/28/2022 testing was negative.  She had been started on molnupiravir 800 mg p.o. twice daily on 1/12 to be continued until 1/17. -Airborne precautions -Add on procalcitonin -Albuterol inhaler -Antitussives as needed -Vitamin C and zinc -Okay to continue molnupiravir if family able to obtain from facility.  COPD, with acute exacerbation Patient noted to have wheezing on physical exam.  O2 saturations are currently maintained on room air. -Give Solu-Medrol 125 mg IV x 1 dose.  Start prednisone p.o. in a.m. -  Albuterol inhaler  Diarrhea Patient has been reported to have episodes of diarrhea and she has has been on  antibiotics for treatment of vegetation on TAVR. -Check C. difficile studies if diarrhea persist  Diastolic congestive heart failure Chronic.  Patient appears to be hypovolemic at this time.  Last echocardiogram noted EF to be 55 to 60% with grade 2 diastolic dysfunction. -Strict I&O's and daily weights -Holding diuretics due to hypotension and AKI.  Monitor and determine when medically appropriate to resume  Diabetes mellitus type 2, without long-term use of insulin On admission glucose 133.  Last hemoglobin A1c was 10 back in 12/2021.  Home medication regimen includes metformin and repaglinide. -Hypoglycemic protocols -Hold metformin and repaglinide -CBGs before every meal with sensitive SSI -Adjust insulin regimen as needed  Paroxsymal atrial fibrillation/flutter Second-degree AV block S/p PPM Holding Lopressor due to hypotension.  Due to the concern for interaction with rifampin patient was switched to full dose Lovenox twice daily for the duration of her antibiotic course. -Continue Lovenox and resume home Eliquis on 2/11  Memory loss Patient has poor memory initially stating that she does not know why she is here. -Delirium precautions -Set bed alarm  Anxiety and depression -Continue venlafaxine, Ativan as needed, and Restoril nightly   Hypothyroidism -Continue levothyroxine  DVT prophylaxis: Lovenox Advance Care Planning:   Code Status: DNR    Consults: None  Family Communication: Daughter of dated over the phone  Severity of Illness: The appropriate patient status for this patient is INPATIENT. Inpatient status is judged to be reasonable and necessary in order to provide the required intensity of service to ensure the patient's safety. The patient's presenting symptoms, physical exam findings, and initial radiographic and laboratory data in the context of their chronic comorbidities is felt to place them at high risk for further clinical deterioration. Furthermore, it  is not anticipated that the patient will be medically stable for discharge from the hospital within 2 midnights of admission.   * I certify that at the point of admission it is my clinical judgment that the patient will require inpatient hospital care spanning beyond 2 midnights from the point of admission due to high intensity of service, high risk for further deterioration and high frequency of surveillance required.*  Author: Norval Morton, MD 04/18/2022 2:25 PM  For on call review [email or web address].

## 2022-04-18 NOTE — Progress Notes (Signed)
ANTICOAGULATION CONSULT NOTE - Initial Consult  Pharmacy Consult for heparin Indication: atrial fibrillation  Allergies  Allergen Reactions   Coreg [Carvedilol] Other (See Comments)    Weak legs   Relafen [Nabumetone] Other (See Comments)    Upset stomach   Calcium Other (See Comments)    Unknown reaction   Codeine Other (See Comments)    Unknown reaction   Lipitor [Atorvastatin] Other (See Comments)    Myalgias   Pacerone [Amiodarone] Other (See Comments)    Hand tremors   Vasotec [Enalapril] Cough   Vioxx [Rofecoxib] Other (See Comments)    Unknown reaction   Zocor [Simvastatin] Other (See Comments)    Myalgias    Patient Measurements:   Heparin Dosing Weight: 80kg  Vital Signs: Temp: 98.2 F (36.8 C) (01/16 2127) Temp Source: Oral (01/16 2127) BP: 160/75 (01/16 2127) Pulse Rate: 104 (01/16 2127)  Labs: Recent Labs    04/18/22 1154  HGB 9.4*  HCT 29.2*  PLT 219  CREATININE 2.60*    Estimated Creatinine Clearance: 19.1 mL/min (A) (by C-G formula based on SCr of 2.6 mg/dL (H)).   Medical History: Past Medical History:  Diagnosis Date   Anxiety    Atrial fibrillation and flutter ([place])    detected by PPM interrogation (mostly atrial flutter)   AV block, 2nd degree 10/2012   s/p MDT ADDRL1 pacemaker implantation 10/10/2012 by [name]   Chronic diastolic heart failure ([place])    Depression    GERD (gastroesophageal reflux disease)    HH (hiatus hernia)    History of pulmonary embolism 10/2008   Bilateral   Hyperlipidemia    Hypertension    Hypothyroidism    Obesity    Osteoarthritis    Peripheral neuropathy    Right leg   Presence of permanent cardiac pacemaker    Pulmonary nodule    in the lingula. Noted on pre TAVR CT. Will require follow up   Renal insufficiency 2010   S/P TAVR (transcatheter aortic valve replacement)    Shingles 09/17/2014   right lower quadrant   Type II or unspecified type diabetes mellitus without mention of  complication, not stated as uncontrolled 2009    Assessment: 66 YOF with hx of afib originally on Eliquis PTA however on rifampin for MSSA bacteremia and switched to lovenox d/t interaction.  Now with severe AKI and will switch to heparin until resolved.  Goal of Therapy:  Heparin level 0.3-0.7 units/ml Monitor platelets by anticoagulation protocol: Yes   Plan:  Heparin gtt at 1100 units/hr F/u 8 hour heparin level F/u renal function and ability to transition to lovenox  Bertis Ruddy, PharmD, [place] Pharmacist ED Pharmacist Phone # [phone] 04/18/2022 9:56 PM

## 2022-04-18 NOTE — ED Triage Notes (Signed)
EMS stated, staff from [facility] said she has a mole or something on her left forearm and continues to bleed. COVID positive . Pt has dementia.

## 2022-04-18 NOTE — ED Provider Notes (Addendum)
[facility] EMERGENCY DEPARTMENT Provider Note   CSN: [id number] Arrival date & time: 04/18/22  1104     History  Chief Complaint  Patient presents with   left arm pain    Jessica [name] is a 89 y.o. female.  Pt with recent admission with suspected sepsis, bacteremia, and was found to have vegetation on TAVR on echo. Pt return if recurrent bleeding to small skin lesion on left forearm in past two days (denies other abnormal bruising or bleeding), as well as generalized weakness. Pt reports recent poor po intake and diarrhea stools. No vomiting. No abd pain or distension. No chest pain or sob. Mild non prod cough. No new or worsening headaches. Recent report of covid+  The history is provided by the patient, medical records and the EMS personnel.       Home Medications Prior to Admission medications   Medication Sig Start Date End Date Taking? Authorizing Provider  allopurinol (ZYLOPRIM) 100 MG tablet TAKE 1 TABLET BY MOUTH DAILY Patient taking differently: Take 100 mg by mouth daily. 02/13/22   Plotnikov, Evie Lacks, MD  apixaban (ELIQUIS) 5 MG TABS tablet Take 1 tablet (5 mg total) by mouth 2 (two) times daily. 05/14/22 08/12/22  Kayleen Memos, DO  b complex vitamins tablet Take 1 tablet by mouth daily.    [provider]  bisacodyl (DULCOLAX) 5 MG EC tablet Take by mouth.    [provider]  Camphor-Phenol (CAMPHO-PHENIQUE EX)     [provider]  Carboxymethylcellul-Glycerin (EQ LUBRICATING EYE DROPS OP) Place 1 drop into both eyes as needed (dry eyes).    [provider]  ceFAZolin (ANCEF) IVPB Inject 2 g into the vein every 8 (eight) hours. Indication:  MSSA PVE First Dose: Yes Last Day of Therapy:  05/11/22 Labs - Once weekly:  CBC/D and CMP, Labs - Every other week:  ESR and CRP Method of administration: IV Push Pull PICC line at the completion of IV antibiotics Method of administration may be changed at the  discretion of home infusion pharmacist based upon assessment of the patient and/or caregiver's ability to self-administer the medication ordered. 04/09/22 05/13/22  Kayleen Memos, DO  Cholecalciferol 1000 UNITS tablet Take by mouth.    [provider]  docusate sodium (COLACE) 100 MG capsule Take by mouth as needed (constipation).    [provider]  ELDERBERRY PO Take by mouth.    [provider]  enoxaparin (LOVENOX) 120 MG/0.8ML injection Inject 0.8 mLs (120 mg total) into the skin every 12 (twelve) hours. 04/09/22 05/13/22  Kayleen Memos, DO  ezetimibe (ZETIA) 10 MG tablet TAKE 1 TABLET BY MOUTH DAILY 02/13/22   Vickie Epley, MD  furosemide (LASIX) 20 MG tablet Take 20 mg by mouth.    [provider]  furosemide (LASIX) 40 MG tablet TAKE 1 TABLET BY MOUTH TWICE  DAILY Patient taking differently: Take 40 mg by mouth 2 (two) times daily. 02/13/22   Plotnikov, Evie Lacks, MD  levothyroxine (SYNTHROID) 75 MCG tablet TAKE 1 TABLET BY MOUTH DAILY Patient taking differently: Take 75 mcg by mouth daily. 02/13/22   Plotnikov, Evie Lacks, MD  LORazepam (ATIVAN) 0.5 MG tablet Take 1 tablet (0.5 mg total) by mouth 2 (two) times daily as needed for up to 10 days. for anxiety 04/09/22 04/19/22  Kayleen Memos, DO  magnesium oxide (MAG-OX) 400 (240 Mg) MG tablet Take 400 mg by mouth.    [provider]  metFORMIN (GLUCOPHAGE) 500 MG tablet TAKE 1 TABLET BY MOUTH TWICE  DAILY WITH MEALS 02/13/22   Plotnikov, Evie Lacks, MD  metoprolol tartrate (LOPRESSOR) 25 MG tablet Take 1 tablet (25 mg total) by mouth 2 (two) times daily. 04/09/22 07/08/22  Kayleen Memos, DO  Multiple Vitamins-Minerals (CENTRUM SILVER ADULT 50+) TABS Take by mouth.    [provider]  potassium chloride SA (KLOR-CON M) 20 MEQ tablet TAKE 1 TABLET BY MOUTH DAILY Patient taking differently: Take 20 mEq by mouth daily. 02/13/22   Plotnikov, Evie Lacks, MD  repaglinide (PRANDIN) 2 MG tablet TAKE 2  TABLETS BY MOUTH 3  TIMES DAILY BEFORE MEALS Patient taking differently: Take 4 mg by mouth with breakfast, with lunch, and with evening meal. 11/07/21   Plotnikov, Evie Lacks, MD  rifampin (RIFADIN) 300 MG capsule Take 1 capsule (300 mg total) by mouth 3 (three) times daily. Stop date 05/11/22 04/09/22 05/13/22  Kayleen Memos, DO  rosuvastatin (CRESTOR) 10 MG tablet TAKE 1 TABLET BY MOUTH UP TO 3  TIMES WEEKLY AS TOLERATED Patient taking differently: Take 10 mg by mouth 3 (three) times a week. 11/14/21   Skeet Latch, MD  sacubitril-valsartan (ENTRESTO) 97-103 MG Take 1 tablet by mouth 2 (two) times daily. 03/07/22   Skeet Latch, MD  senna (SENOKOT) 8.6 MG TABS tablet Take by mouth.    [provider]  Sennosides (EX-LAX MAXIMUM STRENGTH) 25 MG TABS Take by mouth.    [provider]  venlafaxine XR (EFFEXOR-XR) 75 MG 24 hr capsule TAKE 1 CAPSULE BY MOUTH  DAILY WITH BREAKFAST Patient taking differently: Take 75 mg by mouth daily with breakfast. 05/02/21   Plotnikov, Evie Lacks, MD      Allergies    Coreg [carvedilol], Relafen [nabumetone], Calcium, Codeine, Lipitor [atorvastatin], Pacerone [amiodarone], Vasotec [enalapril], Vioxx [rofecoxib], and Zocor [simvastatin]    Review of Systems   Review of Systems  Constitutional:  Negative for chills and fever.  HENT:  Negative for sore throat.   Eyes:  Negative for redness.  Respiratory:  Positive for cough. Negative for shortness of breath.   Cardiovascular:  Negative for chest pain.  Gastrointestinal:  Positive for diarrhea. Negative for abdominal pain and vomiting.  Genitourinary:  Negative for dysuria and flank pain.  Musculoskeletal:  Negative for back pain and neck pain.  Skin:  Negative for rash.  Neurological:  Positive for weakness. Negative for speech difficulty, numbness and headaches.  Psychiatric/Behavioral:  Negative for confusion.     Physical Exam Updated Vital Signs BP (!) 90/45   Pulse 90   Temp 97.9  F (36.6 C) (Oral)   Resp 18   SpO2 96%  Physical Exam Vitals and nursing note reviewed.  Constitutional:      Appearance: Normal appearance. She is well-developed.  HENT:     Head: Atraumatic.     Nose: Nose normal.     Mouth/Throat:     Comments: Dry mucous membranes Eyes:     General: No scleral icterus.    Conjunctiva/sclera: Conjunctivae normal.     Pupils: Pupils are equal, round, and reactive to light.  Neck:     Trachea: No tracheal deviation.  Cardiovascular:     Rate and Rhythm: Normal rate and regular rhythm.     Pulses: Normal pulses.     Heart sounds: Normal heart sounds. No murmur heard.    No friction rub. No gallop.  Pulmonary:     Effort: Pulmonary effort is normal. No respiratory  distress.     Breath sounds: Normal breath sounds.  Abdominal:     General: Bowel sounds are normal. There is no distension.     Palpations: Abdomen is soft.     Tenderness: There is no abdominal tenderness. There is no guarding.  Genitourinary:    Comments: No cva tenderness.  Musculoskeletal:        General: No swelling.     Cervical back: Normal range of motion and neck supple. No rigidity or tenderness. No muscular tenderness.  Skin:    General: Skin is warm and dry.     Findings: No rash.  Neurological:     Mental Status: She is alert.     Comments: Alert, speech normal. Motor/sens grossly intact bil.   Psychiatric:        Mood and Affect: Mood normal.     ED Results / Procedures / Treatments   Labs (all labs ordered are listed, but only abnormal results are displayed) Results for orders placed or performed during the hospital encounter of 04/18/22  CBC with Differential  Result Value Ref Range   WBC 4.3 4.0 - 10.5 K/uL   RBC 3.06 (L) 3.87 - 5.11 MIL/uL   Hemoglobin 9.4 (L) 12.0 - 15.0 g/dL   HCT 29.2 (L) 36.0 - 46.0 %   MCV 95.4 80.0 - 100.0 fL   MCH 30.7 26.0 - 34.0 pg   MCHC 32.2 30.0 - 36.0 g/dL   RDW 14.0 11.5 - 15.5 %   Platelets 219 150 - 400 K/uL    nRBC 0.0 0.0 - 0.2 %   Neutrophils Relative % 43 %   Neutro Abs 1.8 1.7 - 7.7 K/uL   Lymphocytes Relative 39 %   Lymphs Abs 1.7 0.7 - 4.0 K/uL   Monocytes Relative 16 %   Monocytes Absolute 0.7 0.1 - 1.0 K/uL   Eosinophils Relative 1 %   Eosinophils Absolute 0.1 0.0 - 0.5 K/uL   Basophils Relative 0 %   Basophils Absolute 0.0 0.0 - 0.1 K/uL   Immature Granulocytes 1 %   Abs Immature Granulocytes 0.05 0.00 - 0.07 K/uL  Basic metabolic panel  Result Value Ref Range   Sodium 132 (L) 135 - 145 mmol/L   Potassium 3.6 3.5 - 5.1 mmol/L   Chloride 97 (L) 98 - 111 mmol/L   CO2 24 22 - 32 mmol/L   Glucose, Bld 133 (H) 70 - 99 mg/dL   BUN 35 (H) 8 - 23 mg/dL   Creatinine, Ser 2.60 (H) 0.44 - 1.00 mg/dL   Calcium 8.3 (L) 8.9 - 10.3 mg/dL   GFR, Estimated 17 (L) >60 mL/min   Anion gap 11 5 - 15   *Note: Due to a large number of results and/or encounters for the requested time period, some results have not been displayed. A complete set of results can be found in Results Review.   DG Chest Port 1 View  Result Date: 04/18/2022 CLINICAL DATA:  Shortness of breath.  COVID positive. EXAM: PORTABLE CHEST 1 VIEW COMPARISON:  AP chest 03/30/2022 and 03/28/2022; AP chest 01/14/2019, chest two views 01/10/2019; CT chest 07/09/2019 FINDINGS: Left chest wall cardiac pacer is again seen with leads overlying the right atrium and right ventricle. Iliac aortic valve prosthesis is noted. Cardiac silhouette is again mildly enlarged. Mediastinal contours are within limits. Mild-to-moderate calcification within aortic arch. Mild chronic interstitial thickening is unchanged. Improved aeration of the lung bases with persistent left greater than right basilar linear opacities. No  large pleural effusion is seen. No pneumothorax. Mild-to-moderate multilevel degenerative disc changes of the thoracic spine. Note is made of a large sliding hiatal hernia better seen on prior CT. IMPRESSION: Compared to 03/30/2022: Improved  aeration of the lung bases with persistent left greater than right basilar linear opacities likely representing atelectasis. It is difficult to entirely exclude residual left basilar pneumonia. Electronically Signed   By: Yvonne Kendall M.D.   On: 04/18/2022 12:38   [place] EKG SITE RITE  Result Date: 04/06/2022 If Site Rite image not attached, placement could not be confirmed due to current cardiac rhythm.  ECHO TEE  Result Date: 04/05/2022    TRANSESOPHOGEAL ECHO REPORT   Patient Name:   Jessica [name] Date of Exam: 04/05/2022 Medical Rec #:  [id number]          Height:       62.0 in Accession #:    [id number]         Weight:       263.0 lb Date of Birth:  July 24, 1933          BSA:          2.148 m Patient Age:    49 years           BP:           110/53 mmHg Patient Gender: F                  HR:           92 bpm. Exam Location:  Inpatient Procedure: 3D Echo, Transesophageal Echo, Cardiac Doppler and Color Doppler Indications:     Endocarditis  History:         Patient has prior history of Echocardiogram examinations, most                  recent 03/29/2022. Pacemaker and Abnormal ECG, Aortic Valve                  Disease, Arrythmias:Atrial Fibrillation,                  Signs/Symptoms:Bacteremia, Altered Mental Status, Dyspnea and                  Shortness of Breath; Risk Factors:Hypertension, Diabetes and                  Dyslipidemia. Aortic stenosis.                  Aortic Valve: 26 mm Sapien prosthetic, stented (TAVR) valve is                  present in the aortic position. Procedure Date: 01/14/2019.  Sonographer:     Roseanna Rainbow RDCS Referring Phys:  [id number] HAO MENG Diagnosing Phys: [place] PROCEDURE: After discussion of the risks and benefits of a TEE, an informed consent was obtained from the patient. The transesophogeal probe was passed without difficulty through the esophogus of the patient. Imaged were obtained with the patient in a left lateral decubitus position. Sedation performed by  different physician. The patient was monitored while under deep sedation. Anesthestetic sedation was provided intravenously by Anesthesiology: '294mg'$  of Propofol, '100mg'$  of Lidocaine. The patient's vital signs; including heart rate, blood pressure, and oxygen saturation; remained stable throughout the procedure. The patient developed no complications during the procedure.  IMPRESSIONS  1. Left ventricular ejection fraction, by estimation, is 55 to 60%. The left ventricle has normal  function.  2. No vegetation seen on the device lead. Right ventricular systolic function is normal. The right ventricular size is normal.  3. No left atrial/left atrial appendage thrombus was detected.  4. Moderate pleural effusion.  5. The mitral valve is degenerative. Trivial mitral valve regurgitation. Moderate mitral annular calcification.  6. 26 mm Sapien prosthetic valve, procedure date 01/14/2019. 3 cm x 0.3 cm vegetation on the TAVR on the RCC (seen best image 81). No distinct abscess. Mild PVL. No significant rocking or dehiscence. There is a 26 mm Sapien prosthetic (TAVR) valve present in the aortic position. Procedure Date: 01/14/2019.  7. There is mild (Grade II) plaque. Conclusion(s)/Recommendation(s): 3cm vegetation seen on the TAVR prosthesis. No vegetation seen on the device lead or the native cardiac valves. FINDINGS  Left Ventricle: Left ventricular ejection fraction, by estimation, is 55 to 60%. The left ventricle has normal function. The left ventricular internal cavity size was normal in size. Right Ventricle: No vegetation seen on the device lead. The right ventricular size is normal. Right ventricular systolic function is normal. Left Atrium: No left atrial/left atrial appendage thrombus was detected. Mitral Valve: The mitral valve is degenerative in appearance. Moderate mitral annular calcification. Trivial mitral valve regurgitation. Tricuspid Valve: Tricuspid valve regurgitation is mild. Aortic Valve: 26 mm Sapien  prosthetic valve, procedure date 01/14/2019. 3 cm x 0.3 cm vegetation on the TAVR on the RCC (seen best image 81). No distinct abscess. Mild PVL. No significant rocking or dehiscence. There is a 26 mm Sapien prosthetic, stented  (TAVR) valve present in the aortic position. Procedure Date: 01/14/2019. Pulmonic Valve: Pulmonic valve regurgitation is trivial. Aorta: There is mild (Grade II) plaque. IAS/Shunts: No atrial level shunt detected by color flow Doppler. Additional Comments: A device lead is visualized. There is a moderate pleural effusion. Spectral Doppler performed. Phineas Inches Electronically signed by Phineas Inches Signature Date/Time: 04/05/2022/12:04:22 PM    Final (Updated)    CT HEAD WO CONTRAST (5MM)  Result Date: 03/30/2022 CLINICAL DATA:  Stroke follow-up EXAM: CT HEAD WITHOUT CONTRAST TECHNIQUE: Contiguous axial images were obtained from the base of the skull through the vertex without intravenous contrast. RADIATION DOSE REDUCTION: This exam was performed according to the departmental dose-optimization program which includes automated exposure control, adjustment of the mA and/or kV according to patient size and/or use of iterative reconstruction technique. COMPARISON:  None Available. FINDINGS: Brain: No acute intracranial hemorrhage. No focal mass lesion. No CT evidence of acute infarction. No midline shift or mass effect. No hydrocephalus. Basilar cisterns are patent. There are periventricular and subcortical white matter hypodensities. Generalized cortical atrophy. Prominent frontal atrophy. Vascular: No hyperdense vessel or unexpected calcification. Skull: Normal. Negative for fracture or focal lesion. Sinuses/Orbits: Paranasal sinuses and mastoid air cells are clear. Orbits are clear. Other: None. IMPRESSION: 1. No interval change from CT 03/08/2022. 2. No acute cortical infarction Electronically Signed   By: Suzy Bouchard M.D.   On: 03/30/2022 11:54   DG Chest Port 1 View  Result  Date: 03/30/2022 CLINICAL DATA:  6629 with acute respiratory failure. 10027 with tachypnea. EXAM: PORTABLE CHEST 1 VIEW COMPARISON:  Portable chest 03/28/2022, chest CT 07/09/2019. FINDINGS: 3:28 a.m. A left chest dual lead pacing system and wire insertions are unaltered. Large hiatal hernia again noted, and old TAVR. There is aortic atherosclerosis and tortuosity with stable mediastinum. There is mild cardiomegaly. Perihilar vascular prominence is noted without overt edema. There is worsening patchy consolidation in the lower lung fields most likely due to  pneumonia or aspiration. There are small pleural effusions. The mid to upper lung fields remain clear. Overall aeration is otherwise unchanged. IMPRESSION: 1. Worsening patchy consolidation in the lower lung fields most likely due to pneumonia or aspiration. Small pleural effusions. 2. Perihilar vascular prominence without overt edema. 3. Large hiatal hernia. Electronically Signed   By: Telford Nab M.D.   On: 03/30/2022 04:23   ECHOCARDIOGRAM COMPLETE  Result Date: 03/29/2022    ECHOCARDIOGRAM REPORT   Patient Name:   Jessica [name] Date of Exam: 03/29/2022 Medical Rec #:  [id number]          Height:       65.0 in Accession #:    [id number]         Weight:       240.0 lb Date of Birth:  1933-08-09          BSA:          2.138 m Patient Age:    64 years           BP:           119/46 mmHg Patient Gender: F                  HR:           82 bpm. Exam Location:  Inpatient Procedure: 2D Echo, Cardiac Doppler, Color Doppler and Intracardiac            Opacification Agent Indications:    Congestive Heart Failure I50.9  History:        Patient has prior history of Echocardiogram examinations, most                 recent 01/07/2020. Pacemaker, Arrythmias:Atrial Flutter and                 Atrial Fibrillation; Risk Factors:Diabetes, Hypertension,                 Dyslipidemia and GERD.                 Aortic Valve: 26 mm Sapien prosthetic, stented (TAVR) valve  is                 present in the aortic position. Procedure Date: 01/14/19.  Sonographer:    Bernadene Person RDCS Referring Phys: [id number] Lowella Dell REESE  Sonographer Comments: Patient is obese. IMPRESSIONS  1. The aortic valve has been repaired/replaced. There is a 26 mm Sapien prosthetic (TAVR) valve present in the aortic position. Procedure Date: 01/14/19. Aortic valve mean gradient measures 15.0 mmHg. Aortic valve Vmax measures 2.61 m/s. EOA 1.9cm2, DI 0.5. There is mild paravalvular leak located at the 2 o'clock position on PSAX view.  2. Left ventricular ejection fraction, by estimation, is 55 to 60%. The left ventricle has normal function. Septal motion is paradoxical in the setting of RV pacing. There is moderate asymmetric left ventricular hypertrophy of the basal-septal segment. Left ventricular diastolic parameters are consistent with Grade II diastolic dysfunction (pseudonormalization).  3. Right ventricular systolic function is normal. The right ventricular size is normal. There is normal pulmonary artery systolic pressure. The estimated right ventricular systolic pressure is 12.8 mmHg.  4. Left atrial size was mildly dilated.  5. The mitral valve is degenerative. Trivial mitral valve regurgitation. Moderate mitral annular calcification.  6. Aortic dilatation noted. There is borderline dilatation of the ascending aorta, measuring 37 mm.  7. The inferior vena cava is dilated in size with >50%  respiratory variability, suggesting right atrial pressure of 8 mmHg. Comparison(s): Compared to prior TTE in 2021, the mean aortic gradient is 15 from 51mHg but EOA similar at ~2cm2. FINDINGS  Left Ventricle: Left ventricular ejection fraction, by estimation, is 55 to 60%. The left ventricle has normal function. The left ventricle has no regional wall motion abnormalities. Definity contrast agent was given IV to delineate the left ventricular  endocardial borders. The left ventricular internal cavity size was  normal in size. There is moderate asymmetric left ventricular hypertrophy of the basal-septal segment. Abnormal (paradoxical) septal motion, consistent with RV pacemaker. Left ventricular diastolic parameters are consistent with Grade II diastolic dysfunction (pseudonormalization). Right Ventricle: The right ventricular size is normal. Right vetricular wall thickness was not well visualized. Right ventricular systolic function is normal. There is normal pulmonary artery systolic pressure. The tricuspid regurgitant velocity is 2.61 m/s, and with an assumed right atrial pressure of 8 mmHg, the estimated right ventricular systolic pressure is 378.2mmHg. Left Atrium: Left atrial size was mildly dilated. Right Atrium: Right atrial size was normal in size. Pericardium: There is no evidence of pericardial effusion. Mitral Valve: The mitral valve is degenerative in appearance. There is mild thickening of the mitral valve leaflet(s). There is mild calcification of the mitral valve leaflet(s). Moderate mitral annular calcification. Trivial mitral valve regurgitation. Tricuspid Valve: The tricuspid valve is normal in structure. Tricuspid valve regurgitation is mild. Aortic Valve: Mild paravalvular leak located at the 2o'clock position on PSAX view. DI 0.5. The aortic valve has been repaired/replaced. Aortic valve mean gradient measures 15.0 mmHg. Aortic valve peak gradient measures 27.2 mmHg. EOA 1.9cm2. There is a 26 mm Sapien prosthetic, stented (TAVR) valve present in the aortic position. Procedure Date: 01/14/19. Pulmonic Valve: The pulmonic valve was normal in structure. Pulmonic valve regurgitation is trivial. Aorta: Aortic dilatation noted. There is borderline dilatation of the ascending aorta, measuring 37 mm. Venous: The inferior vena cava is dilated in size with greater than 50% respiratory variability, suggesting right atrial pressure of 8 mmHg. IAS/Shunts: The atrial septum is grossly normal. Additional Comments: A  device lead is visualized.  LEFT VENTRICLE PLAX 2D LVIDd:         5.20 cm     Diastology LVIDs:         3.00 cm     LV e' medial:    4.56 cm/s LV PW:         0.80 cm     LV E/e' medial:  34.2 LV IVS:        0.80 cm     LV e' lateral:   4.56 cm/s LVOT diam:     2.60 cm     LV E/e' lateral: 34.2 LV SV:         127 LV SV Index:   60 LVOT Area:     5.31 cm  LV Volumes (MOD) LV vol d, MOD A2C: 75.8 ml LV vol d, MOD A4C: 72.9 ml LV vol s, MOD A2C: 34.8 ml LV vol s, MOD A4C: 32.8 ml LV SV MOD A2C:     41.0 ml LV SV MOD A4C:     72.9 ml LV SV MOD BP:      41.9 ml RIGHT VENTRICLE RV S prime:     14.90 cm/s TAPSE (M-mode): 1.8 cm LEFT ATRIUM             Index        RIGHT ATRIUM  Index LA diam:        5.00 cm 2.34 cm/m   RA Area:     16.90 cm LA Vol (A2C):   72.9 ml 34.11 ml/m  RA Volume:   41.40 ml  19.37 ml/m LA Vol (A4C):   81.4 ml 38.08 ml/m LA Biplane Vol: 77.1 ml 36.07 ml/m  AORTIC VALVE AV Area (Vmax):    2.56 cm AV Area (Vmean):   2.46 cm AV Area (VTI):     2.68 cm AV Vmax:           261.00 cm/s AV Vmean:          185.667 cm/s AV VTI:            0.475 m AV Peak Grad:      27.2 mmHg AV Mean Grad:      15.0 mmHg LVOT Vmax:         126.00 cm/s LVOT Vmean:        85.900 cm/s LVOT VTI:          0.240 m LVOT/AV VTI ratio: 0.50  AORTA Ao Root diam: 3.30 cm Ao Asc diam:  3.70 cm MITRAL VALVE                TRICUSPID VALVE MV Area (PHT): 3.91 cm     TR Peak grad:   27.2 mmHg MV Decel Time: 194 msec     TR Vmax:        261.00 cm/s MV E velocity: 156.00 cm/s MV A velocity: 84.60 cm/s   SHUNTS MV E/A ratio:  1.84         Systemic VTI:  0.24 m                             Systemic Diam: 2.60 cm Gwyndolyn Kaufman MD Electronically signed by Gwyndolyn Kaufman MD Signature Date/Time: 03/29/2022/11:52:14 AM    Final    CT ANGIO HEAD NECK W WO CM  Result Date: 03/28/2022 CLINICAL DATA:  Provided history: Altered mental status, septic, right lower extremity weakness. EXAM: CT ANGIOGRAPHY HEAD AND NECK TECHNIQUE:  Multidetector CT imaging of the head and neck was performed using the standard protocol during bolus administration of intravenous contrast. Multiplanar CT image reconstructions and MIPs were obtained to evaluate the vascular anatomy. Carotid stenosis measurements (when applicable) are obtained utilizing NASCET criteria, using the distal internal carotid diameter as the denominator. RADIATION DOSE REDUCTION: This exam was performed according to the departmental dose-optimization program which includes automated exposure control, adjustment of the mA and/or kV according to patient size and/or use of iterative reconstruction technique. CONTRAST:  52m OMNIPAQUE IOHEXOL 350 MG/ML SOLN COMPARISON:  Report from brain MRI 03/23/2002 (images unavailable). FINDINGS: CT HEAD FINDINGS Brain: Frontal predominant cerebral atrophy. Questionable intermediate to low-density subdural collection overlying the left cerebral hemisphere, measuring up to 6 mm in thickness (for instance as seen on series 8, image 34). No more than mild mass effect upon the underlying left cerebral hemisphere. No midline shift. Mild patchy and ill-defined hypoattenuation within the cerebral white matter, nonspecific but compatible with chronic small vessel ischemic disease. No demarcated cortical infarct. No evidence of an intracranial mass. Vascular: No hyperdense vessel. Atherosclerotic calcifications. Skull: No fracture or aggressive osseous lesion. Sinuses/Orbits: No mass or acute finding within the imaged orbits. Trace mucosal thickening scattered within the paranasal sinuses. Other: 10 mm lucent lesion within the anterior maxilla at midline. This may reflect an  incisive canal cyst or a residual cyst. Review of the MIP images confirms the above findings CTA NECK FINDINGS Aortic arch: Standard aortic branching. Atherosclerotic plaque within the visualized aortic arch and proximal major branch vessels of the neck No hemodynamically significant  innominate or proximal subclavian artery stenosis. Right carotid system: CCA and ICA patent within the neck without stenosis. Mild atherosclerotic plaque within the mid to distal CCA and about the carotid bifurcation. Left carotid system: CCA and ICA patent within the neck without hemodynamically significant stenosis (50% or greater). Atherosclerotic plaque, greatest about the carotid bifurcation and within the proximal ICA. Vertebral arteries: Vertebral arteries codominant and patent within the neck without hemodynamically significant stenosis. Nonstenotic atherosclerotic plaque at the origin of the right vertebral artery. Skeleton: Cervical spondylosis. Bilateral facet joint ankylosis at C2-C3. No acute fracture or aggressive osseous lesion. Other neck: No neck mass or cervical lymphadenopathy. Upper chest: No consolidation within the imaged lung apices. Review of the MIP images confirms the above findings CTA HEAD FINDINGS Anterior circulation: The intracranial internal carotid arteries are patent. Atherosclerotic plaque within both vessels without stenosis. The M1 middle cerebral arteries are patent. No M2 proximal branch occlusion or high-grade proximal stenosis. The anterior cerebral arteries are patent. No intracranial aneurysm is identified. Posterior circulation: The intracranial vertebral arteries are patent. Sclerotic plaque within the left V4 segment without stenosis. The basilar artery is patent. The posterior cerebral arteries are patent. A right posterior communicating artery is present. The left posterior communicating artery is diminutive or absent. Venous sinuses: Within the limitations of contrast timing, no convincing thrombus. Anatomic variants: As described. Review of the MIP images confirms the above findings CT head impression #1 called by telephone at the time of interpretation on 03/28/2022 at 2:26 pm to provider Georgina Snell , who verbally acknowledged these results. IMPRESSION: CT  head: 1. Questionable intermediate to low-density subdural collection overlying the left cerebral hemisphere (measuring up to 6 mm). This may reflect artifact or a subacute-to-chronic subdural hematoma. However, given the provided history of sepsis, a subdural empyema cannot be excluded. Consider a brain MRI without and with contrast for further evaluation, if the patient is able to have one. No more than mild mass effect upon the underlying left cerebral hemisphere. No midline shift. 2. Mild chronic small-vessel ischemic changes within the cerebral white matter. 3. Frontal predominant cerebral atrophy. CTA neck: 1. The common carotid, internal carotid and vertebral arteries are patent within the neck without hemodynamically significant stenosis. Atherosclerotic plaque within these vessels, as described. 2.  Aortic Atherosclerosis (ICD10-I70.0). CTA head: Intracranial atherosclerotic disease, as described. No intracranial large vessel occlusion or proximal high-grade arterial stenosis identified. Electronically Signed   By: Kellie Simmering D.O.   On: 03/28/2022 14:28   CT ABDOMEN PELVIS W CONTRAST  Result Date: 03/28/2022 CLINICAL DATA:  Altered mental status. Sepsis. Right lower extremity weakness. EXAM: CT ABDOMEN AND PELVIS WITH CONTRAST TECHNIQUE: Multidetector CT imaging of the abdomen and pelvis was performed using the standard protocol following bolus administration of intravenous contrast. RADIATION DOSE REDUCTION: This exam was performed according to the departmental dose-optimization program which includes automated exposure control, adjustment of the mA and/or kV according to patient size and/or use of iterative reconstruction technique. CONTRAST:  76m OMNIPAQUE IOHEXOL 350 MG/ML SOLN COMPARISON:  01/07/2019 FINDINGS: Lower chest: Mild patchy density at the lung bases that could be atelectasis or minimal basilar pneumonia. Hiatal hernia is present. 8 mm nodule previously seen in the lingula is  unchanged since October of  2951 and therefore certainly benign. Hepatobiliary: Liver parenchyma is normal. Previous cholecystectomy. Pancreas: Normal Spleen: Normal Adrenals/Urinary Tract: Adrenal glands are normal. Right kidney is normal. Left kidney is normal except for a nonobstructing 3 mm stone in the lower pole. No hydroureteronephrosis. No stone in the bladder Stomach/Bowel: Hiatal hernia as noted above. No acute gastric finding. Small-bowel is normal. No acute colon finding. Diverticulosis of the left colon without visible diverticulitis. Vascular/Lymphatic: Aortic atherosclerosis. No aneurysm. IVC is normal. No adenopathy Reproductive: No pelvic mass of significance. Small uterine leiomyomas. Other: No free fluid or air. Musculoskeletal: Ordinary spinal degenerative changes. Osteoarthritis of both hips. IMPRESSION: 1. No acute abdominal or pelvic finding. 2. Mild patchy density at the lung bases that could be atelectasis or minimal basilar pneumonia. 3. Hiatal hernia. 4. Previous cholecystectomy. 5. 3 mm nonobstructing stone in the lower pole of the left kidney. 6. Diverticulosis of the left colon without visible diverticulitis. 7. Aortic atherosclerosis. Aortic Atherosclerosis (ICD10-I70.0). Electronically Signed   By: Nelson Chimes M.D.   On: 03/28/2022 14:00   DG Chest Port 1 View  Result Date: 03/28/2022 CLINICAL DATA:  Sepsis. EXAM: PORTABLE CHEST 1 VIEW COMPARISON:  January 14, 2019. FINDINGS: Stable cardiomediastinal silhouette. Left-sided pacemaker is unchanged in position. Mild central pulmonary vascular congestion is noted. Status post transcatheter aortic valve repair. Mild bibasilar subsegmental atelectasis is noted. Bony thorax is unremarkable. IMPRESSION: Stable cardiomediastinal silhouette with probable mild central pulmonary vascular congestion. Mild bibasilar subsegmental atelectasis. Electronically Signed   By: Marijo Conception M.D.   On: 03/28/2022 11:11    EKG EKG  Interpretation  Date/Time:  Tuesday April 18 2022 14:39:08 EST Ventricular Rate:  88 PR Interval:  186 QRS Duration: 180 QT Interval:  460 QTC Calculation: 556 R Axis:   -73 Text Interpretation: Atrial-sensed ventricular-paced rhythm Confirmed by Lajean Saver [phone]) on 04/18/2022 2:51:29 PM  Radiology DG Chest Port 1 View  Result Date: 04/18/2022 CLINICAL DATA:  Shortness of breath.  COVID positive. EXAM: PORTABLE CHEST 1 VIEW COMPARISON:  AP chest 03/30/2022 and 03/28/2022; AP chest 01/14/2019, chest two views 01/10/2019; CT chest 07/09/2019 FINDINGS: Left chest wall cardiac pacer is again seen with leads overlying the right atrium and right ventricle. Iliac aortic valve prosthesis is noted. Cardiac silhouette is again mildly enlarged. Mediastinal contours are within limits. Mild-to-moderate calcification within aortic arch. Mild chronic interstitial thickening is unchanged. Improved aeration of the lung bases with persistent left greater than right basilar linear opacities. No large pleural effusion is seen. No pneumothorax. Mild-to-moderate multilevel degenerative disc changes of the thoracic spine. Note is made of a large sliding hiatal hernia better seen on prior CT. IMPRESSION: Compared to 03/30/2022: Improved aeration of the lung bases with persistent left greater than right basilar linear opacities likely representing atelectasis. It is difficult to entirely exclude residual left basilar pneumonia. Electronically Signed   By: Yvonne Kendall M.D.   On: 04/18/2022 12:38    Procedures Procedures    Medications Ordered in ED Medications  0.9 %  sodium chloride infusion (has no administration in time range)  lactated ringers bolus 1,000 mL (1,000 mLs Intravenous New Bag/Given 04/18/22 1430)    ED Course/ Medical Decision Making/ A&P                             Medical Decision Making Problems Addressed: Acute diarrhea: acute illness or injury AKI (acute kidney injury) ([place]):  acute illness or injury with systemic symptoms  that poses a threat to life or bodily functions COVID-19 virus infection: acute illness or injury Dehydration: acute illness or injury with systemic symptoms that poses a threat to life or bodily functions Generalized weakness: acute illness or injury with systemic symptoms that poses a threat to life or bodily functions Skin lesion: acute illness or injury  Amount and/or Complexity of Data Reviewed Independent Historian: EMS    Details: hx External Data Reviewed: labs and notes. Labs: ordered. Decision-making details documented in ED Course. Radiology: ordered and independent interpretation performed. Decision-making details documented in ED Course. ECG/medicine tests: ordered and independent interpretation performed. Decision-making details documented in ED Course. Discussion of management or test interpretation with external provider(s): medicine  Risk Prescription drug management. Decision regarding hospitalization.   Iv ns. Continuous pulse ox and cardiac monitoring. Labs ordered/sent. Imaging ordered.   Differential diagnosis includes  bleeding from wound, coagulopathy, anemia, aki, dehydration etc. Dispo decision including potential need for admission considered - will get labs and imaging and reassess.   Reviewed nursing notes and prior charts for additional history. External reports reviewed. Additional history from: EMS.   Cardiac monitor: sinus rhythm, rate 90.  Labs reviewed/interpreted by me - aki c/w dehydration. LR bolus.   Xrays reviewed/interpreted by me - no def pna. Atelectasis.   Given significant aki, dehydration, will consult hospitalists for admission. Discussed w Dr Tamala Julian, will see/admit.   Additional ivf. Ua pending.   CRITICAL CARE RE: hypotension, bp 87/57, dehydration/AKI Performed by: Mirna Mires Total critical care time: 40 minutes Critical care time was exclusive of separately billable procedures  and treating other patients. Critical care was necessary to treat or prevent imminent or life-threatening deterioration. Critical care was time spent personally by me on the following activities: development of treatment plan with patient and/or surrogate as well as nursing, discussions with consultants, evaluation of patient's response to treatment, examination of patient, obtaining history from patient or surrogate, ordering and performing treatments and interventions, ordering and review of laboratory studies, ordering and review of radiographic studies, pulse oximetry and re-evaluation of patient's condition.            Final Clinical Impression(s) / ED Diagnoses Final diagnoses:  Generalized weakness  Skin lesion  Dehydration  AKI (acute kidney injury) ([place])  Acute diarrhea  COVID-19 virus infection    Rx / DC Orders ED Discharge Orders     None          Lajean Saver, MD 04/18/22 1452

## 2022-04-18 NOTE — ED Notes (Signed)
Patient is screaming at staff and demanding that staff do something. When RN asks patient what she would like me to do, patient responds 'i don't know. Just help me"

## 2022-04-18 NOTE — ED Notes (Signed)
Patient refuses to keep BP cuff on arm

## 2022-04-19 DIAGNOSIS — E876 Hypokalemia: Secondary | ICD-10-CM

## 2022-04-19 DIAGNOSIS — R410 Disorientation, unspecified: Secondary | ICD-10-CM | POA: Insufficient documentation

## 2022-04-19 DIAGNOSIS — Z8679 Personal history of other diseases of the circulatory system: Secondary | ICD-10-CM | POA: Diagnosis not present

## 2022-04-19 DIAGNOSIS — I959 Hypotension, unspecified: Secondary | ICD-10-CM | POA: Diagnosis not present

## 2022-04-19 DIAGNOSIS — F03918 Unspecified dementia, unspecified severity, with other behavioral disturbance: Secondary | ICD-10-CM

## 2022-04-19 DIAGNOSIS — N179 Acute kidney failure, unspecified: Secondary | ICD-10-CM | POA: Diagnosis not present

## 2022-04-19 DIAGNOSIS — Z87898 Personal history of other specified conditions: Secondary | ICD-10-CM | POA: Diagnosis not present

## 2022-04-19 LAB — CBC
HCT: 25 % — ABNORMAL LOW (ref 36.0–46.0)
Hemoglobin: 8.2 g/dL — ABNORMAL LOW (ref 12.0–15.0)
MCH: 30.6 pg (ref 26.0–34.0)
MCHC: 32.8 g/dL (ref 30.0–36.0)
MCV: 93.3 fL (ref 80.0–100.0)
Platelets: 200 10*3/uL (ref 150–400)
RBC: 2.68 MIL/uL — ABNORMAL LOW (ref 3.87–5.11)
RDW: 14.1 % (ref 11.5–15.5)
WBC: 3.7 10*3/uL — ABNORMAL LOW (ref 4.0–10.5)
nRBC: 0 % (ref 0.0–0.2)

## 2022-04-19 LAB — BASIC METABOLIC PANEL
Anion gap: 14 (ref 5–15)
BUN: 34 mg/dL — ABNORMAL HIGH (ref 8–23)
CO2: 19 mmol/L — ABNORMAL LOW (ref 22–32)
Calcium: 7.7 mg/dL — ABNORMAL LOW (ref 8.9–10.3)
Chloride: 101 mmol/L (ref 98–111)
Creatinine, Ser: 2.46 mg/dL — ABNORMAL HIGH (ref 0.44–1.00)
GFR, Estimated: 19 mL/min — ABNORMAL LOW (ref >60)
Glucose, Bld: 131 mg/dL — ABNORMAL HIGH (ref 70–99)
Potassium: 3.4 mmol/L — ABNORMAL LOW (ref 3.5–5.1)
Sodium: 134 mmol/L — ABNORMAL LOW (ref 135–145)

## 2022-04-19 LAB — GLUCOSE, CAPILLARY: Glucose-Capillary: 139 mg/dL — ABNORMAL HIGH (ref 70–99)

## 2022-04-19 LAB — HEMOGLOBIN A1C
Hgb A1c MFr Bld: 8.4 % — ABNORMAL HIGH (ref 4.8–5.6)
Mean Plasma Glucose: 194.38 mg/dL

## 2022-04-19 LAB — CBG MONITORING, ED
Glucose-Capillary: 107 mg/dL — ABNORMAL HIGH (ref 70–99)
Glucose-Capillary: 147 mg/dL — ABNORMAL HIGH (ref 70–99)
Glucose-Capillary: 156 mg/dL — ABNORMAL HIGH (ref 70–99)

## 2022-04-19 LAB — HEPARIN LEVEL (UNFRACTIONATED): Heparin Unfractionated: 0.73 IU/mL — ABNORMAL HIGH (ref 0.30–0.70)

## 2022-04-19 LAB — APTT: aPTT: >200 seconds (ref 24–36)

## 2022-04-19 MED ORDER — HEPARIN (PORCINE) 25000 UT/250ML-% IV SOLN
950.0000 [IU]/h | INTRAVENOUS | Status: DC
  Administered 2022-04-19: 950 [IU]/h via INTRAVENOUS

## 2022-04-19 MED ORDER — PROTAMINE SULFATE 10 MG/ML IV SOLN
50.0000 mg | Freq: Once | INTRAVENOUS | Status: AC
  Administered 2022-04-20: 50 mg via INTRAVENOUS
  Filled 2022-04-19: qty 5

## 2022-04-19 MED ORDER — VALPROATE SODIUM 100 MG/ML IV SOLN
500.0000 mg | Freq: Once | INTRAVENOUS | Status: AC
  Administered 2022-04-19: 500 mg via INTRAVENOUS
  Filled 2022-04-19: qty 5

## 2022-04-19 MED ORDER — LORAZEPAM 2 MG/ML IJ SOLN
1.0000 mg | Freq: Once | INTRAMUSCULAR | Status: AC | PRN
  Administered 2022-04-19: 1 mg via INTRAVENOUS
  Filled 2022-04-19: qty 1

## 2022-04-19 MED ORDER — VENLAFAXINE HCL ER 75 MG PO CP24
75.0000 mg | ORAL_CAPSULE | Freq: Every day | ORAL | Status: DC
  Administered 2022-04-19 – 2022-04-24 (×6): 75 mg via ORAL
  Filled 2022-04-19 (×7): qty 1

## 2022-04-19 MED ORDER — POTASSIUM CHLORIDE IN NACL 20-0.9 MEQ/L-% IV SOLN
INTRAVENOUS | Status: DC
  Filled 2022-04-19 (×3): qty 1000

## 2022-04-19 MED ORDER — PREDNISONE 20 MG PO TABS
20.0000 mg | ORAL_TABLET | Freq: Every day | ORAL | Status: DC
  Administered 2022-04-20 – 2022-04-21 (×2): 20 mg via ORAL
  Filled 2022-04-19 (×2): qty 1

## 2022-04-19 MED ORDER — POTASSIUM CHLORIDE 10 MEQ/100ML IV SOLN
10.0000 meq | INTRAVENOUS | Status: AC
  Administered 2022-04-19 (×4): 10 meq via INTRAVENOUS
  Filled 2022-04-19 (×4): qty 100

## 2022-04-19 NOTE — ED Notes (Signed)
ED TO INPATIENT HANDOFF REPORT  ED Nurse Name and Phone #:  Kerri Perches [phone]   S Name/Age/Gender Jessica [name] 89 y.o. female Room/Bed: 040C/040C  Code Status   Code Status: DNR  Home/SNF/Other Nursing Home Patient oriented to: self and situation Is this baseline? Yes   Triage Complete: Triage complete  Chief Complaint AKI (acute kidney injury) [facility]) [N17.9]  Triage Note EMS stated, staff from [facility] said she has a mole or something on her left forearm and continues to bleed. COVID positive . Pt has dementia.   Allergies Allergies  Allergen Reactions   Coreg [Carvedilol] Other (See Comments)    Weak legs   Relafen [Nabumetone] Other (See Comments)    Upset stomach   Calcium Other (See Comments)    Unknown reaction   Codeine Other (See Comments)    Unknown reaction   Lipitor [Atorvastatin] Other (See Comments)    Myalgias   Pacerone [Amiodarone] Other (See Comments)    Hand tremors   Vasotec [Enalapril] Cough   Vioxx [Rofecoxib] Other (See Comments)    Unknown reaction   Zocor [Simvastatin] Other (See Comments)    Myalgias    Level of Care/Admitting Diagnosis ED Disposition     ED Disposition  Admit   Condition  --   [place]: [place] [[id number]]  Level of Care: Progressive [102]  Admit to Progressive based on following criteria: CARDIOVASCULAR & THORACIC of moderate stability with acute coronary syndrome symptoms/low risk myocardial infarction/hypertensive urgency/arrhythmias/heart failure potentially compromising stability and stable post cardiovascular intervention patients.  May admit patient to [facility] or [facility] if equivalent level of care is available:: No  Covid Evaluation: Symptomatic Person Under Investigation (PUI) or recent exposure (last 10 days) *Testing Required*  Diagnosis: AKI (acute kidney injury) [facility]) [[id number]]  Admitting Physician: Norval Morton [[id number]]  Attending Physician: Norval Morton [[id number]]  Certification:: I certify this patient will need inpatient services for at least 2 midnights  Estimated Length of Stay: 2          B Medical/Surgery History Past Medical History:  Diagnosis Date   Anxiety    Atrial fibrillation and flutter ([place])    detected by PPM interrogation (mostly atrial flutter)   AV block, 2nd degree 10/2012   s/p MDT ADDRL1 pacemaker implantation 10/10/2012 by [name]   Chronic diastolic heart failure ([place])    Depression    GERD (gastroesophageal reflux disease)    HH (hiatus hernia)    History of pulmonary embolism 10/2008   Bilateral   Hyperlipidemia    Hypertension    Hypothyroidism    Obesity    Osteoarthritis    Peripheral neuropathy    Right leg   Presence of permanent cardiac pacemaker    Pulmonary nodule    in the lingula. Noted on pre TAVR CT. Will require follow up   Renal insufficiency 2010   S/P TAVR (transcatheter aortic valve replacement)    Shingles 09/17/2014   right lower quadrant   Type II or unspecified type diabetes mellitus without mention of complication, not stated as uncontrolled 2009   Past Surgical History:  Procedure Laterality Date   APPENDECTOMY     AV NODE ABLATION N/A 07/05/2017   Procedure: AV NODE ABLATION;  Surgeon: [name], MD;  Location: [place] [facility];  Service: Cardiovascular;  Laterality: N/A;   CARPAL TUNNEL RELEASE     CATARACT EXTRACTION     CHOLECYSTECTOMY  COLONOSCOPY  2010   COLONOSCOPY WITH PROPOFOL N/A 07/25/2021   Procedure: COLONOSCOPY WITH PROPOFOL;  Surgeon: Mauri Pole, MD;  Location: [facility] ENDOSCOPY;  Service: Gastroenterology;  Laterality: N/A;   FOREARM FRACTURE SURGERY  09/17/2008   HERNIA REPAIR     INGUINAL HERNIA REPAIR     PACEMAKER INSERTION  10/10/2012   MDT ADDRL1 implanted for 2nd degree AV block by [name]   PERMANENT PACEMAKER INSERTION N/A 10/10/2012   Procedure: PERMANENT PACEMAKER INSERTION;  Surgeon: [name], MD;   Location: [facility] CATH LAB;  Service: Cardiovascular;  Laterality: N/A;   RIGHT/LEFT HEART CATH AND CORONARY ANGIOGRAPHY N/A 12/30/2018   Procedure: RIGHT/LEFT HEART CATH AND CORONARY ANGIOGRAPHY;  Surgeon: [name], MD;  Location: [place] [facility];  Service: Cardiovascular;  Laterality: N/A;   ROTATOR CUFF REPAIR     TEE WITHOUT CARDIOVERSION N/A 01/14/2019   Procedure: TRANSESOPHAGEAL ECHOCARDIOGRAM (TEE);  Surgeon: [name], MD;  Location: [place] [facility];  Service: Open Heart Surgery;  Laterality: N/A;   TEE WITHOUT CARDIOVERSION N/A 04/05/2022   Procedure: TRANSESOPHAGEAL ECHOCARDIOGRAM (TEE);  Surgeon: [name], MD;  Location: [place];  Service: Cardiovascular;  Laterality: N/A;   TRANSCATHETER AORTIC VALVE REPLACEMENT, TRANSFEMORAL N/A 01/14/2019   Procedure: TRANSCATHETER AORTIC VALVE REPLACEMENT, TRANSFEMORAL;  Surgeon: [name], MD;  Location: [place] [facility];  Service: Open Heart Surgery;  Laterality: N/A;     A IV Location/Drains/Wounds Patient Lines/Drains/Airways Status     Active Line/Drains/Airways     Name Placement date Placement time Site Days   PICC Single Lumen 36/64/40 Right Basilic 39 cm 0 cm 34/74/25  9563  Basilic  12   External Urinary Catheter 04/18/22  2110  --  1            Intake/Output Last 24 hours  Intake/Output Summary (Last 24 hours) at 04/19/2022 1725 Last data filed at 04/19/2022 1539 Gross per 24 hour  Intake --  Output 1201 ml  Net -1201 ml    Labs/Imaging Results for orders placed or performed during the hospital encounter of 04/18/22 (from the past 48 hour(s))  CBC with Differential     Status: Abnormal   Collection Time: 04/18/22 11:54 AM  Result Value Ref Range   WBC 4.3 4.0 - 10.5 K/uL   RBC 3.06 (L) 3.87 - 5.11 MIL/uL   Hemoglobin 9.4 (L) 12.0 - 15.0 g/dL   HCT 29.2 (L) 36.0 - 46.0 %   MCV 95.4 80.0 - 100.0 fL   MCH 30.7 26.0 - 34.0 pg   MCHC 32.2 30.0 - 36.0 g/dL    RDW 14.0 11.5 - 15.5 %   Platelets 219 150 - 400 K/uL   nRBC 0.0 0.0 - 0.2 %   Neutrophils Relative % 43 %   Neutro Abs 1.8 1.7 - 7.7 K/uL   Lymphocytes Relative 39 %   Lymphs Abs 1.7 0.7 - 4.0 K/uL   Monocytes Relative 16 %   Monocytes Absolute 0.7 0.1 - 1.0 K/uL   Eosinophils Relative 1 %   Eosinophils Absolute 0.1 0.0 - 0.5 K/uL   Basophils Relative 0 %   Basophils Absolute 0.0 0.0 - 0.1 K/uL   Immature Granulocytes 1 %   Abs Immature Granulocytes 0.05 0.00 - 0.07 K/uL    Comment: Performed at [facility], [address], [place], [place] [address]  Basic metabolic panel     Status: Abnormal   Collection Time: 04/18/22 11:54 AM  Result Value Ref Range   Sodium 132 (L) 135 - 145 mmol/L   Potassium 3.6 3.5 - 5.1 mmol/L   Chloride 97 (L) 98 - 111 mmol/L   CO2 24 22 - 32 mmol/L   Glucose, Bld 133 (H) 70 - 99 mg/dL    Comment: Glucose reference range applies only to samples taken after fasting for at least 8 hours.   BUN 35 (H) 8 - 23 mg/dL   Creatinine, Ser 2.60 (H) 0.44 - 1.00 mg/dL   Calcium 8.3 (L) 8.9 - 10.3 mg/dL   GFR, Estimated 17 (L) >60 mL/min    Comment: (NOTE) Calculated using the CKD-EPI Creatinine Equation (2021)    Anion gap 11 5 - 15    Comment: Performed at [facility] [address], [place], [place] [address]  Procalcitonin - Baseline     Status: None   Collection Time: 04/18/22 11:54 AM  Result Value Ref Range   Procalcitonin 0.94 ng/mL    Comment:        Interpretation: PCT > 0.5 ng/mL and <= 2 ng/mL: Systemic infection (sepsis) is possible, but other conditions are known to elevate PCT as well. (NOTE)       Sepsis PCT Algorithm           Lower Respiratory Tract                                      Infection PCT Algorithm    ----------------------------     ----------------------------         PCT < 0.25 ng/mL                PCT < 0.10 ng/mL          Strongly encourage             Strongly discourage   discontinuation of antibiotics     initiation of antibiotics    ----------------------------     -----------------------------       PCT 0.25 - 0.50 ng/mL            PCT 0.10 - 0.25 ng/mL               OR       >80% decrease in PCT            Discourage initiation of                                            antibiotics      Encourage discontinuation           of antibiotics    ----------------------------     -----------------------------         PCT >= 0.50 ng/mL              PCT 0.26 - 0.50 ng/mL                AND       <80% decrease in PCT             Encourage initiation of  antibiotics       Encourage continuation           of antibiotics    ----------------------------     -----------------------------        PCT >= 0.50 ng/mL                  PCT > 0.50 ng/mL               AND         increase in PCT                  Strongly encourage                                      initiation of antibiotics    Strongly encourage escalation           of antibiotics                                     -----------------------------                                           PCT <= 0.25 ng/mL                                                 OR                                        > 80% decrease in PCT                                      Discontinue / Do not initiate                                             antibiotics  Performed at [facility], [address], [place], [place] [address]   Resp panel by RT-PCR (RSV, Flu A&B, Covid) Anterior Nasal Swab     Status: Abnormal   Collection Time: 04/18/22  2:05 PM   Specimen: Anterior Nasal Swab  Result Value Ref Range   SARS Coronavirus 2 by RT PCR POSITIVE (A) NEGATIVE    Comment: (NOTE) SARS-CoV-2 target nucleic acids are DETECTED.  The SARS-CoV-2 RNA is generally detectable in upper respiratory specimens during the acute phase of infection. Positive results are indicative of the presence of the identified  virus, but do not rule out bacterial infection or co-infection with other pathogens not detected by the test. Clinical correlation with patient history and other diagnostic information is necessary to determine patient infection status. The expected result is Negative.  Fact Sheet for Patients: EntrepreneurPulse.com.au  Fact Sheet for Healthcare Providers: IncredibleEmployment.be  This test is not yet approved or cleared by the [place] FDA and  has been  authorized for detection and/or diagnosis of SARS-CoV-2 by FDA under an Emergency Use Authorization (EUA).  This EUA will remain in effect (meaning this test can be used) for the duration of  the COVID-19 declaration under Section 564(b)(1) of the A ct, 21 U.S.C. section 360bbb-3(b)(1), unless the authorization is terminated or revoked sooner.     Influenza A by PCR NEGATIVE NEGATIVE   Influenza B by PCR NEGATIVE NEGATIVE    Comment: (NOTE) The Xpert Xpress SARS-CoV-2/FLU/RSV plus assay is intended as an aid in the diagnosis of influenza from Nasopharyngeal swab specimens and should not be used as a sole basis for treatment. Nasal washings and aspirates are unacceptable for Xpert Xpress SARS-CoV-2/FLU/RSV testing.  Fact Sheet for Patients: EntrepreneurPulse.com.au  Fact Sheet for Healthcare Providers: IncredibleEmployment.be  This test is not yet approved or cleared by the [place] FDA and has been authorized for detection and/or diagnosis of SARS-CoV-2 by FDA under an Emergency Use Authorization (EUA). This EUA will remain in effect (meaning this test can be used) for the duration of the COVID-19 declaration under Section 564(b)(1) of the Act, 21 U.S.C. section 360bbb-3(b)(1), unless the authorization is terminated or revoked.     Resp Syncytial Virus by PCR NEGATIVE NEGATIVE    Comment: (NOTE) Fact Sheet for  Patients: EntrepreneurPulse.com.au  Fact Sheet for Healthcare Providers: IncredibleEmployment.be  This test is not yet approved or cleared by the [place] FDA and has been authorized for detection and/or diagnosis of SARS-CoV-2 by FDA under an Emergency Use Authorization (EUA). This EUA will remain in effect (meaning this test can be used) for the duration of the COVID-19 declaration under Section 564(b)(1) of the Act, 21 U.S.C. section 360bbb-3(b)(1), unless the authorization is terminated or revoked.  Performed at [facility], [place] [address], [place], [place] [address]   CBG monitoring, ED     Status: Abnormal   Collection Time: 04/18/22  9:00 PM  Result Value Ref Range   Glucose-Capillary 100 (H) 70 - 99 mg/dL    Comment: Glucose reference range applies only to samples taken after fasting for at least 8 hours.  Urinalysis, Routine w reflex microscopic Urine, Clean Catch     Status: Abnormal   Collection Time: 04/18/22 10:44 PM  Result Value Ref Range   Color, Urine AMBER (A) YELLOW    Comment: BIOCHEMICALS MAY BE AFFECTED BY COLOR   APPearance HAZY (A) CLEAR   Specific Gravity, Urine 1.011 1.005 - 1.030   pH 5.0 5.0 - 8.0   Glucose, UA NEGATIVE NEGATIVE mg/dL   Hgb urine dipstick MODERATE (A) NEGATIVE   Bilirubin Urine NEGATIVE NEGATIVE   Ketones, ur NEGATIVE NEGATIVE mg/dL   Protein, ur 30 (A) NEGATIVE mg/dL   Nitrite NEGATIVE NEGATIVE   Leukocytes,Ua MODERATE (A) NEGATIVE   RBC / HPF 11-20 0 - 5 RBC/hpf   WBC, UA 11-20 0 - 5 WBC/hpf   Bacteria, UA FEW (A) NONE SEEN   Squamous Epithelial / HPF 0-5 0 - 5 /HPF   Mucus PRESENT    Amorphous Crystal PRESENT     Comment: Performed at [facility], [address], [place], [place] [address]  CBC     Status: Abnormal   Collection Time: 04/19/22  5:00 AM  Result Value Ref Range   WBC 3.7 (L) 4.0 - 10.5 K/uL   RBC 2.68 (L) 3.87 - 5.11 MIL/uL   Hemoglobin 8.2 (L) 12.0 -  15.0 g/dL   HCT 25.0 (L) 36.0 - 46.0 %  MCV 93.3 80.0 - 100.0 fL   MCH 30.6 26.0 - 34.0 pg   MCHC 32.8 30.0 - 36.0 g/dL   RDW 14.1 11.5 - 15.5 %   Platelets 200 150 - 400 K/uL   nRBC 0.0 0.0 - 0.2 %    Comment: Performed at [facility], [place] [address] [place], [place] [address]  Basic metabolic panel     Status: Abnormal   Collection Time: 04/19/22  5:00 AM  Result Value Ref Range   Sodium 134 (L) 135 - 145 mmol/L   Potassium 3.4 (L) 3.5 - 5.1 mmol/L   Chloride 101 98 - 111 mmol/L   CO2 19 (L) 22 - 32 mmol/L   Glucose, Bld 131 (H) 70 - 99 mg/dL    Comment: Glucose reference range applies only to samples taken after fasting for at least 8 hours.   BUN 34 (H) 8 - 23 mg/dL   Creatinine, Ser 2.46 (H) 0.44 - 1.00 mg/dL   Calcium 7.7 (L) 8.9 - 10.3 mg/dL   GFR, Estimated 19 (L) >60 mL/min    Comment: (NOTE) Calculated using the CKD-EPI Creatinine Equation (2021)    Anion gap 14 5 - 15    Comment: Performed at [facility] [address], [place], [place] [address]  Heparin level (unfractionated)     Status: Abnormal   Collection Time: 04/19/22  5:00 AM  Result Value Ref Range   Heparin Unfractionated 0.73 (H) 0.30 - 0.70 IU/mL    Comment: (NOTE) The clinical reportable range upper limit is being lowered to >1.10 to align with the FDA approved guidance for the current laboratory assay.  If heparin results are below expected values, and patient dosage has  been confirmed, suggest follow up testing of antithrombin III levels. Performed at [facility], [place] [address], [place], [place] [address]   Hemoglobin A1c     Status: Abnormal   Collection Time: 04/19/22  5:00 AM  Result Value Ref Range   Hgb A1c MFr Bld 8.4 (H) 4.8 - 5.6 %    Comment: (NOTE) Pre diabetes:          5.7%-6.4%  Diabetes:              >6.4%  Glycemic control for   <7.0% adults with diabetes    Mean Plasma Glucose 194.38 mg/dL    Comment: Performed at [facility] [address] [place], [place] [address]  CBG monitoring, ED     Status: Abnormal   Collection Time: 04/19/22  8:23 AM  Result Value Ref Range   Glucose-Capillary 107 (H) 70 - 99 mg/dL    Comment: Glucose reference range applies only to samples taken after fasting for at least 8 hours.   Comment 1 Notify RN    Comment 2 Document in Chart   APTT     Status: Abnormal   Collection Time: 04/19/22  8:52 AM  Result Value Ref Range   aPTT >200 (HH) 24 - 36 seconds    Comment:        IF BASELINE aPTT IS ELEVATED, SUGGEST PATIENT RISK ASSESSMENT BE USED TO DETERMINE APPROPRIATE ANTICOAGULANT THERAPY. REPEATED TO VERIFY CRITICAL RESULT CALLED TO, READ BACK BY AND VERIFIED WITH: Claretta Fraise, RN [phone] 04/19/22 L. KLAR Performed at [facility], [place] [address] [place], [place] [address]   CBG monitoring, ED     Status: Abnormal   Collection Time: 04/19/22 12:27 PM  Result Value Ref Range   Glucose-Capillary 147 (H) 70 - 99 mg/dL    Comment: Glucose reference range applies only to samples taken after fasting for at least 8 hours.   *Note: Due to a large number of results and/or encounters for the requested time period, some results have not been displayed. A complete set of results can be found in Results Review.   DG Chest Port 1 View  Result Date: 04/18/2022 CLINICAL DATA:  Shortness of breath.  COVID positive. EXAM: PORTABLE CHEST 1 VIEW COMPARISON:  AP chest 03/30/2022 and 03/28/2022; AP chest 01/14/2019, chest two views 01/10/2019; CT chest 07/09/2019 FINDINGS: Left chest wall cardiac pacer is again seen with leads overlying the right atrium and right ventricle. Iliac aortic valve prosthesis is noted. Cardiac silhouette is again mildly enlarged. Mediastinal contours are within limits. Mild-to-moderate calcification within aortic arch. Mild chronic interstitial thickening is unchanged. Improved aeration of the lung bases with persistent left greater than right basilar linear opacities. No large  pleural effusion is seen. No pneumothorax. Mild-to-moderate multilevel degenerative disc changes of the thoracic spine. Note is made of a large sliding hiatal hernia better seen on prior CT. IMPRESSION: Compared to 03/30/2022: Improved aeration of the lung bases with persistent left greater than right basilar linear opacities likely representing atelectasis. It is difficult to entirely exclude residual left basilar pneumonia. Electronically Signed   By: Yvonne Kendall M.D.   On: 04/18/2022 12:38    Pending Labs Unresulted Labs (From admission, onward)     Start     Ordered   04/20/22 0500  APTT  Daily,   R      04/19/22 1028   04/20/22 0500  Magnesium  Tomorrow morning,   R        04/19/22 1456   04/20/22 0500  CK  Tomorrow morning,   R        04/19/22 1456   04/20/22 0500  Comprehensive metabolic panel  Tomorrow morning,   R        04/19/22 1456   04/20/22 0500  Phosphorus  Tomorrow morning,   R        04/19/22 1456   04/20/22 0500  Vitamin B12  (Anemia Panel (PNL))  Tomorrow morning,   R        04/19/22 1456   04/20/22 0500  Folate  (Anemia Panel (PNL))  Tomorrow morning,   R        04/19/22 1456   04/20/22 0500  Iron and TIBC  (Anemia Panel (PNL))  Tomorrow morning,   R        04/19/22 1456   04/20/22 0500  Ferritin  (Anemia Panel (PNL))  Tomorrow morning,   R        04/19/22 1456   04/20/22 0500  Reticulocytes  (Anemia Panel (PNL))  Tomorrow morning,   R        04/19/22 1456   04/19/22 2000  APTT  Once-Timed,   TIMED        04/19/22 1028   04/19/22 2000  Heparin level (unfractionated)  Once-Timed,   TIMED        04/19/22 1028   04/19/22 0500  Heparin level (unfractionated)  Daily,   R     See Hyperspace for full Linked Orders Report.   04/18/22 2204   04/19/22 0500  CBC  Daily,   R     See Hyperspace for full Linked Orders Report.   04/18/22 2204   04/18/22 1406  C Difficile Quick Screen w PCR reflex  (C Difficile quick screen w PCR reflex panel )  Once, for 24 hours,   URGENT        References:    CDiff Information Tool   04/18/22 1405            Vitals/Pain Today's Vitals   04/19/22 1229 04/19/22 1237 04/19/22 1300 04/19/22 1545  BP:   117/60 (!) 139/57  Pulse: 95  100 (!) 109  Resp:   20 (!) 22  Temp:  98.2 F (36.8 C)  98.1 F (36.7 C)  TempSrc:  Oral    SpO2: 93%  90% 92%  PainSc:        Isolation Precautions Airborne and Contact precautions  Medications Medications  sodium chloride flush (NS) 0.9 % injection 3 mL (3 mLs Intravenous Given 04/19/22 0906)  acetaminophen (TYLENOL) tablet 650 mg (has no administration in time range)    Or  acetaminophen (TYLENOL) suppository 650 mg (has no administration in time range)  rifampin (RIFADIN) capsule 300 mg (300 mg Oral Given 04/19/22 1035)  rosuvastatin (CRESTOR) tablet 10 mg (10 mg Oral Given 04/19/22 0906)  temazepam (RESTORIL) capsule 7.5 mg (7.5 mg Oral Given 04/18/22 2358)  LORazepam (ATIVAN) tablet 0.5 mg (0.5 mg Oral Given 04/18/22 2117)  levothyroxine (SYNTHROID) tablet 75 mcg (75 mcg Oral Patient Refused/Not Given 04/19/22 0501)  saccharomyces boulardii (FLORASTOR) capsule 250 mg (250 mg Oral Not Given 04/19/22 1153)  magnesium oxide (MAG-OX) tablet 400 mg (400 mg Oral Given 04/19/22 0905)  multivitamin with minerals tablet 1 tablet (1 tablet Oral Given 04/19/22 0905)  nystatin (MYCOSTATIN/NYSTOP) topical powder 1 Application (1 Application Topical Not Given 04/19/22 1023)  polyvinyl alcohol (LIQUIFILM TEARS) 1.4 % ophthalmic solution 1 drop (has no administration in time range)  allopurinol (ZYLOPRIM) tablet 100 mg (100 mg Oral Given 04/19/22 0905)  molnupiravir EUA (LAGEVRIO) capsule 800 mg (800 mg Oral Not Given 04/19/22 1036)  albuterol (VENTOLIN HFA) 108 (90 Base) MCG/ACT inhaler 2 puff (2 puffs Inhalation Given 04/19/22 1436)  guaiFENesin-dextromethorphan (ROBITUSSIN DM) 100-10 MG/5ML syrup 10 mL (has no administration in time range)  ascorbic acid (VITAMIN C) tablet 500 mg (500 mg Oral Given  04/19/22 0905)  zinc sulfate capsule 220 mg (220 mg Oral Given 04/19/22 0906)  insulin aspart (novoLOG) injection 0-9 Units (1 Units Subcutaneous Given 04/19/22 1228)  insulin aspart (novoLOG) injection 0-5 Units ( Subcutaneous Not Given 04/18/22 2104)  ceFAZolin (ANCEF) IVPB 2g/100 mL premix (0 g Intravenous Stopped 04/19/22 0942)  0.9 % NaCl with KCl 20 mEq/ L  infusion ( Intravenous New Bag/Given 04/19/22 1027)  heparin ADULT infusion 100 units/mL (25000 units/266m) (950 Units/hr Intravenous New Bag/Given 04/19/22 1218)  predniSONE (DELTASONE) tablet 20 mg (has no administration in time range)  venlafaxine XR (EFFEXOR-XR) 24 hr capsule 75 mg (75 mg Oral Given 04/19/22 1541)  lactated ringers bolus 1,000 mL (0 mLs Intravenous Stopped 04/18/22 1607)  sodium chloride 0.9 % bolus 500 mL (0 mLs Intravenous Stopped 04/18/22 1743)  sodium chloride 0.9 % bolus 500 mL (0 mLs Intravenous Stopped 04/18/22 1747)  methylPREDNISolone sodium succinate (SOLU-MEDROL) 125 mg/2 mL injection 125 mg (125 mg Intravenous Given 04/18/22 2102)  LORazepam (ATIVAN) injection 1 mg (1 mg Intravenous Given 04/19/22 0222)  valproate (DEPACON) 500 mg in dextrose 5 % 50 mL IVPB (0 mg Intravenous Stopped 04/19/22 0603)  potassium chloride 10 mEq in 100 mL IVPB (0 mEq Intravenous Stopped 04/19/22 1358)    Mobility  Focused Assessments Neuro Assessment Handoff:  Swallow screen pass? Yes          Neuro Assessment: Exceptions to [facility] Neuro Checks:      Has TPA been given? No If patient is a Neuro Trauma and patient is going to OR before floor call report to [facility] nurse: [phone] or [phone]   R Recommendations: See Admitting Provider Note  Report given to: Report attempted to call NA  Additional Notes:

## 2022-04-19 NOTE — ED Notes (Signed)
Patient removed self from monitor and is pulling on PICC line. Patient placed in safety mitts

## 2022-04-19 NOTE — Progress Notes (Signed)
[place] for heparin Indication: atrial fibrillation  Allergies  Allergen Reactions   Coreg [Carvedilol] Other (See Comments)    Weak legs   Relafen [Nabumetone] Other (See Comments)    Upset stomach   Calcium Other (See Comments)    Unknown reaction   Codeine Other (See Comments)    Unknown reaction   Lipitor [Atorvastatin] Other (See Comments)    Myalgias   Pacerone [Amiodarone] Other (See Comments)    Hand tremors   Vasotec [Enalapril] Cough   Vioxx [Rofecoxib] Other (See Comments)    Unknown reaction   Zocor [Simvastatin] Other (See Comments)    Myalgias    Patient Measurements:   Heparin Dosing Weight: 80kg  Vital Signs: Temp: 98.2 F (36.8 C) (01/17 0226) Temp Source: Oral (01/17 0226) BP: 120/66 (01/17 0600) Pulse Rate: 92 (01/17 0600)  Labs: Recent Labs    04/18/22 1154 04/19/22 0500  HGB 9.4* 8.2*  HCT 29.2* 25.0*  PLT 219 200  HEPARINUNFRC  --  0.73*  CREATININE 2.60* 2.46*    Estimated Creatinine Clearance: 20.2 mL/min (A) (by C-G formula based on SCr of 2.46 mg/dL (H)).   Medical History: Past Medical History:  Diagnosis Date   Anxiety    Atrial fibrillation and flutter ([place])    detected by PPM interrogation (mostly atrial flutter)   AV block, 2nd degree 10/2012   s/p MDT ADDRL1 pacemaker implantation 10/10/2012 by [name]   Chronic diastolic heart failure ([place])    Depression    GERD (gastroesophageal reflux disease)    HH (hiatus hernia)    History of pulmonary embolism 10/2008   Bilateral   Hyperlipidemia    Hypertension    Hypothyroidism    Obesity    Osteoarthritis    Peripheral neuropathy    Right leg   Presence of permanent cardiac pacemaker    Pulmonary nodule    in the lingula. Noted on pre TAVR CT. Will require follow up   Renal insufficiency 2010   S/P TAVR (transcatheter aortic valve replacement)    Shingles 09/17/2014   right lower quadrant   Type II or unspecified type  diabetes mellitus without mention of complication, not stated as uncontrolled 2009    Assessment: 63 YOF with hx of afib originally on Eliquis PTA however on rifampin for MSSA bacteremia and switched to lovenox d/t interaction.  Now with severe AKI and will switch to heparin until resolved.  Heparin level 0.73 units/mL and aPTT > 200 on heparin 1100 units/hr.   Verified with RN aPTT was drawn from a separate line from where heparin was infusing. With recent lovenox dose and potential to alter heparin level accuracy, would lean towards dosing based on aPTT until correlating.   Goal of Therapy:  Heparin level 0.3-0.7 units/ml aPTT 66-102 seconds Monitor platelets by anticoagulation protocol: Yes   Plan:  Hold heparin x 1 hour. Restart at 1200 at 950 units/hr Check 8hr aPTT/heparin level at 2000 F/u renal function and ability to transition to lovenox  Erskine Speed, PharmD Clinical Pharmacist 04/19/2022 7:09 AM

## 2022-04-19 NOTE — Significant Event (Signed)
[facility] floor coverage note: Pt with persistent bleeding from all IV sites while on heparin drip (for A.Fib history) despite pressure bandages.  Hemodynamically stable.  Heparin drip stopped, bleeding slowed but still persistent.  Ordered Protamine sulfate to be dosed per pharmacy for heparin reversal.

## 2022-04-19 NOTE — ED Notes (Signed)
This RN went into patient's room to find that patient had removed PIV x2 on left arm. Both catheter tips intact, bleeding controlled and arm wrapped in kerlix at this time.

## 2022-04-19 NOTE — ED Notes (Signed)
Patient continues to scream at top of lungs stating "open my door, let me out, let me die." Patient informed that she is COVID + and unable to have door open at this time. D/t patient's weakness and inability to walk at this time patient informed to stay in bed. Patient proceeds to hang over side rail and remove self from bed side monitor. MD Opyd made aware of patient's behavior via chat.

## 2022-04-19 NOTE — ED Notes (Signed)
Patient removed safety mitts and continues to pull on monitor cords and PICC line after multiple redirections

## 2022-04-19 NOTE — ED Notes (Signed)
RN attempted to call report 

## 2022-04-19 NOTE — ED Notes (Signed)
Patient is screaming. Patient informed to use call bell to call for help and call bell provided to patient. Patient persists to scream for help instead

## 2022-04-19 NOTE — Progress Notes (Addendum)
Brief Pharmacy Note  Heparin has been paused x2h and pt continues to bleed from IV sites, now also with some blood in mouth as well as increased RR and O2 requirement.  TR[facility] DO requests pharmacy to reverse heparin with protamine.    Protamine '50mg'$  IV x1. F/u to resume heparin when appropriate.  Wynona Neat, PharmD, BCPS 04/19/2022 11:52 PM

## 2022-04-19 NOTE — Progress Notes (Signed)
Patient was seen for agitation.   She is encephalopathic and became agitated tonight, trying to pull out her PICC. Antipsychotics were avoided d/t prolonged QT interval and she was given a dose of Ativan without any improvement. Mittens were placed but she was able to quickly remove them. Plan to give a dose of Depakote and use soft-restraints for now.

## 2022-04-19 NOTE — ED Notes (Signed)
Patient bed pad changed after bowel movement and repositioning.

## 2022-04-19 NOTE — Progress Notes (Signed)
PROGRESS NOTE  Jessica [name] HCW:[id number] DOB: Mar 10, 1934   PCP: [name], MD  Patient is from: SNF DOA: 04/18/2022 LOS: 1  Chief complaints Chief Complaint  Patient presents with   left arm pain     Brief Narrative / Interim history: 89 year old F with PMH of diastolic CHF, A-fib/flutter, AVB s/p PPM, AS s/p TAVR, DM-2, anxiety, depression, hypothyroidism, GERD, cognitive impairment, and recent hospitalization from 12/26-1/7 with septic shock due to MSSA bacteremia and TAVR endocarditis on Ancef and rifampin presenting with diarrhea, poor p.o. intake, nausea and bleeding from left arm lesion, and admitted for AKI, hypotension and  COPD exacerbation in the setting of recent COVID-19 infection. Cr 2.6 (baseline 0.8).  CXR with improved aeration of the lung bases with persistent left > right bibasilar renal opacities.  COVID-19 screen positive.  Received IV fluid bolus and started on maintenance IV and admitted.  Subjective: Seen and examined earlier this morning.  Patient was agitated overnight requiring Depakote and wrist restraints.  She is awake and oriented x 4 and follows command this morning.  She says she wants to go home.  She states her mouth is bitter.  She reports nausea and diffuse abdominal pain.  She denies chest pain or shortness of breath.  Patient's stepson at bedside.  Objective: Vitals:   04/19/22 1228 04/19/22 1229 04/19/22 1237 04/19/22 1300  BP: (!) 153/69   117/60  Pulse: 89 95  100  Resp:    20  Temp:   98.2 F (36.8 C)   TempSrc:   Oral   SpO2: 93% 93%  90%    Examination:  GENERAL: No apparent distress.  Nontoxic. HEENT: MMM.  Vision and hearing grossly intact.  NECK: Supple.  No apparent JVD.  RESP:  No IWOB.  Fair aeration bilaterally. CVS:  RRR. Heart sounds normal.  ABD/GI/GU: BS+. Abd soft.  Mild diffuse tenderness.  No rebound or guarding. MSK/EXT:  Moves extremities. No apparent deformity. No edema.  SKIN: no apparent skin  lesion or wound NEURO: Awake, alert and oriented x 4 except date.  Follows commands.  Limited insight.  No apparent focal neuro deficit. PSYCH: Calm. Normal affect.   Procedures:  None  Microbiology summarized: 1/16-COVID-19 PCR positive.  She was also positive at facility before admission. 1/16-influenza and RSV PCR nonreactive.  Assessment and plan: Principal Problem:   AKI (acute kidney injury) ([place]) Active Problems:   Hypotension   History of endocarditis   History of bacteremia   COVID-19 virus infection   COPD with acute exacerbation ([facility])   Diarrhea   Chronic diastolic heart failure ([place])   DM type 2, controlled, with complication ([place])   Atrial fibrillation and flutter ([place])   Anxiety disorder   Hypothyroidism   Dementia ([place])  Acute kidney injury: Suspect prerenal due to poor p.o. intake, diarrhea and concurrent use of diuretics and Entresto Recent Labs    03/30/22 0347 03/31/22 1053 04/02/22 0037 04/03/22 0035 04/05/22 0044 04/06/22 0841 04/08/22 0448 04/09/22 0603 04/18/22 1154 04/19/22 0500  BUN '13 9 12 8 '$ 7* 7* 8 9 35* 34*  CREATININE 0.86 0.74 0.77 0.64 0.70 0.75 0.91 0.82 2.60* 2.46*  -Continue IV fluid.  Change NS to NS with KCl given hypokalemia -Hold nephrotoxic meds.   Hypotension: Likely due to dehydration and concurrent use of antihypertensive meds.  Resolved. -Continue holding antihypertensive meds -Continue IV fluid   MSSA bacteremia/TAVR endocarditis: -Continue IV Ancef through 05/11/2022 as previously planned -Continue rifampin 300 mg 3 times  daily until 05/13/2022 as previously planned   COVID-19 infection: Likely contributing to poor p.o. intake, diarrhea and AKI.  Tested positive at facility.  She had been started on molnupiravir 800 mg p.o. twice daily on 1/12 to be continued until 1/17. -Supportive care with inhalers, vitamin C, zinc, antitussive and mucolytic's. -Airborne precautions -Okay to continue molnupiravir if family able to  obtain from facility.   COPD, with acute exacerbation: Likely due to COVID-19 infection.  Seems to have improved.   -Given her delirium, will wean off steroid quickly.  Decreased to 20 mg daily starting tomorrow -Albuterol inhaler   Diarrhea: Seems to have resolved.  She was unable to provide stool samples for C. difficile   Chronic diastolic congestive heart failure: Recent TTE with LVEF of 55 to 60% and G2 DD.  Appears dehydrated on exam. -Continue IV fluid as above -Hold diuretics and Entresto. -Closely monitor respiratory and fluid status while on IV fluid   Uncontrolled NIDDM-2 with hyperglycemia: A1c 10% on 12/26/2021 Recent Labs  Lab 04/18/22 2100 04/19/22 0823 04/19/22 1227  GLUCAP 100* 107* 147*  -Continue current insulin regimen -Recheck hemoglobin A1c   Paroxsymal atrial fibrillation/flutter Second-degree AV block s/p PPM -Continue holding Lopressor -Full dose Lovenox twice daily while on rifampin due to interaction between Eliquis and rifampin. -Can resume home Eliquis once she is off of rifampin on 2/11   Delirium/cognitive impairment with behavioral disturbance: She was agitated requiring Depakote and restraints.  Looks calmer today.  Son-in-law at bedside.  She is oriented x 4 except date. Patient has poor memory initially stating that she does not know why she is here. -Wean off steroid as quick as possible -Reorientation and delirium precautions -Fall precautions  Normocytic anemia: Drop in Hgb likely dilutional from IV fluids Recent Labs    03/28/22 1123 03/29/22 0522 03/30/22 0347 03/31/22 1053 04/02/22 0037 04/03/22 0035 04/05/22 0044 04/08/22 0448 04/18/22 1154 04/19/22 0500  HGB 12.2 10.2* 10.6* 10.4* 12.9 10.1* 10.0* 9.7* 9.4* 8.2*  -Monitor for signs of bleeding -Check anemia panel in the morning  Anxiety and depression -Continue venlafaxine, Ativan as needed, and Restoril nightly    Hypothyroidism -Continue  levothyroxine  Hypokalemia: -Monitor replenish as appropriate.  There is no height or weight on file to calculate BMI.           DVT prophylaxis:  On full dose anticoagulation.  Code Status: DNR/DNI Family Communication: Updated son-in-law at bedside. Level of care: Progressive Status is: Inpatient Remains inpatient appropriate because: AKI and delirium   Final disposition: SNF Consultants:  None  55 minutes with more than 50% spent in reviewing records, counseling patient/family and coordinating care.   Sch Meds:  Scheduled Meds:  albuterol  2 puff Inhalation Q6H   allopurinol  100 mg Oral Daily   vitamin C  500 mg Oral Daily   insulin aspart  0-5 Units Subcutaneous QHS   insulin aspart  0-9 Units Subcutaneous TID WC   levothyroxine  75 mcg Oral Q0600   magnesium oxide  400 mg Oral Daily   molnupiravir EUA  4 capsule Oral BID   multivitamin with minerals  1 tablet Oral Daily   nystatin  1 Application Topical Daily   predniSONE  40 mg Oral Q breakfast   rifampin  300 mg Oral TID   rosuvastatin  10 mg Oral Q M,W,F   saccharomyces boulardii  250 mg Oral BID   sodium chloride flush  3 mL Intravenous Q12H   temazepam  7.5  mg Oral QHS   zinc sulfate  220 mg Oral Daily   Continuous Infusions:  0.9 % NaCl with KCl 20 mEq / L 100 mL/hr at 04/19/22 1027    ceFAZolin (ANCEF) IV Stopped (04/19/22 1610)   heparin 950 Units/hr (04/19/22 1218)   PRN Meds:.acetaminophen **OR** acetaminophen, guaiFENesin-dextromethorphan, LORazepam, polyvinyl alcohol  Antimicrobials: Anti-infectives (From admission, onward)    Start     Dose/Rate Route Frequency Ordered Stop   04/18/22 2200  molnupiravir EUA (LAGEVRIO) capsule 800 mg        4 capsule Oral 2 times daily 04/18/22 1717 04/20/22 2159   04/18/22 2200  ceFAZolin (ANCEF) IVPB 2g/100 mL premix        2 g 200 mL/hr over 30 Minutes Intravenous Every 12 hours 04/18/22 1915     04/18/22 1715  ceFAZolin (ANCEF) IVPB  Status:   Discontinued       Note to Pharmacy: Indication:  MSSA PVE First Dose: Yes Last Day of Therapy:  05/11/22 Labs - Once weekly:  CBC/D and CMP, Labs - Every other week:  ESR and CRP Method of administration: IV Push Pull PICC line at the completion of IV antibiotics Method of administration may be changed at the discretion of home i   2 g Intravenous Every 8 hours 04/18/22 1703 04/18/22 1915   04/18/22 1715  rifampin (RIFADIN) capsule 300 mg       Note to Pharmacy: Stop date 05/11/22     300 mg Oral 3 times daily 04/18/22 1703          I have personally reviewed the following labs and images: CBC: Recent Labs  Lab 04/18/22 1154 04/19/22 0500  WBC 4.3 3.7*  NEUTROABS 1.8  --   HGB 9.4* 8.2*  HCT 29.2* 25.0*  MCV 95.4 93.3  PLT 219 200   BMP &GFR Recent Labs  Lab 04/18/22 1154 04/19/22 0500  NA 132* 134*  K 3.6 3.4*  CL 97* 101  CO2 24 19*  GLUCOSE 133* 131*  BUN 35* 34*  CREATININE 2.60* 2.46*  CALCIUM 8.3* 7.7*   Estimated Creatinine Clearance: 20.2 mL/min (A) (by C-G formula based on SCr of 2.46 mg/dL (H)). Liver & Pancreas: No results for input(s): "AST", "ALT", "ALKPHOS", "BILITOT", "PROT", "ALBUMIN" in the last 168 hours. No results for input(s): "LIPASE", "AMYLASE" in the last 168 hours. No results for input(s): "AMMONIA" in the last 168 hours. Diabetic: No results for input(s): "HGBA1C" in the last 72 hours. Recent Labs  Lab 04/18/22 2100 04/19/22 0823 04/19/22 1227  GLUCAP 100* 107* 147*   Cardiac Enzymes: No results for input(s): "CKTOTAL", "CKMB", "CKMBINDEX", "TROPONINI" in the last 168 hours. No results for input(s): "PROBNP" in the last 8760 hours. Coagulation Profile: No results for input(s): "INR", "PROTIME" in the last 168 hours. Thyroid Function Tests: No results for input(s): "TSH", "T4TOTAL", "FREET4", "T3FREE", "THYROIDAB" in the last 72 hours. Lipid Profile: No results for input(s): "CHOL", "HDL", "LDLCALC", "TRIG", "CHOLHDL",  "LDLDIRECT" in the last 72 hours. Anemia Panel: No results for input(s): "VITAMINB12", "FOLATE", "FERRITIN", "TIBC", "IRON", "RETICCTPCT" in the last 72 hours. Urine analysis:    Component Value Date/Time   COLORURINE AMBER (A) 04/18/2022 2244   APPEARANCEUR HAZY (A) 04/18/2022 2244   LABSPEC 1.011 04/18/2022 2244   PHURINE 5.0 04/18/2022 2244   GLUCOSEU NEGATIVE 04/18/2022 2244   GLUCOSEU NEGATIVE 09/07/2020 1244   HGBUR MODERATE (A) 04/18/2022 [place] NEGATIVE 04/18/2022 [place] 04/18/2022 2244  PROTEINUR 30 (A) 04/18/2022 2244   UROBILINOGEN 0.2 09/07/2020 1244   NITRITE NEGATIVE 04/18/2022 2244   LEUKOCYTESUR MODERATE (A) 04/18/2022 2244   Sepsis Labs: Invalid input(s): "PROCALCITONIN", "LACTICIDVEN"  Microbiology: Recent Results (from the past 240 hour(s))  Resp panel by RT-PCR (RSV, Flu A&B, Covid) Anterior Nasal Swab     Status: Abnormal   Collection Time: 04/18/22  2:05 PM   Specimen: Anterior Nasal Swab  Result Value Ref Range Status   SARS Coronavirus 2 by RT PCR POSITIVE (A) NEGATIVE Final    Comment: (NOTE) SARS-CoV-2 target nucleic acids are DETECTED.  The SARS-CoV-2 RNA is generally detectable in upper respiratory specimens during the acute phase of infection. Positive results are indicative of the presence of the identified virus, but do not rule out bacterial infection or co-infection with other pathogens not detected by the test. Clinical correlation with patient history and other diagnostic information is necessary to determine patient infection status. The expected result is Negative.  Fact Sheet for Patients: EntrepreneurPulse.com.au  Fact Sheet for Healthcare Providers: IncredibleEmployment.be  This test is not yet approved or cleared by the [place] FDA and  has been authorized for detection and/or diagnosis of SARS-CoV-2 by FDA under an Emergency Use Authorization (EUA).  This  EUA will remain in effect (meaning this test can be used) for the duration of  the COVID-19 declaration under Section 564(b)(1) of the A ct, 21 U.S.C. section 360bbb-3(b)(1), unless the authorization is terminated or revoked sooner.     Influenza A by PCR NEGATIVE NEGATIVE Final   Influenza B by PCR NEGATIVE NEGATIVE Final    Comment: (NOTE) The Xpert Xpress SARS-CoV-2/FLU/RSV plus assay is intended as an aid in the diagnosis of influenza from Nasopharyngeal swab specimens and should not be used as a sole basis for treatment. Nasal washings and aspirates are unacceptable for Xpert Xpress SARS-CoV-2/FLU/RSV testing.  Fact Sheet for Patients: EntrepreneurPulse.com.au  Fact Sheet for Healthcare Providers: IncredibleEmployment.be  This test is not yet approved or cleared by the [place] FDA and has been authorized for detection and/or diagnosis of SARS-CoV-2 by FDA under an Emergency Use Authorization (EUA). This EUA will remain in effect (meaning this test can be used) for the duration of the COVID-19 declaration under Section 564(b)(1) of the Act, 21 U.S.C. section 360bbb-3(b)(1), unless the authorization is terminated or revoked.     Resp Syncytial Virus by PCR NEGATIVE NEGATIVE Final    Comment: (NOTE) Fact Sheet for Patients: EntrepreneurPulse.com.au  Fact Sheet for Healthcare Providers: IncredibleEmployment.be  This test is not yet approved or cleared by the [place] FDA and has been authorized for detection and/or diagnosis of SARS-CoV-2 by FDA under an Emergency Use Authorization (EUA). This EUA will remain in effect (meaning this test can be used) for the duration of the COVID-19 declaration under Section 564(b)(1) of the Act, 21 U.S.C. section 360bbb-3(b)(1), unless the authorization is terminated or revoked.  Performed at [facility], [place] [address] [place],  [place] [address]     Radiology Studies: No results found.    Aizah Gehlhausen T. [place]  If 7PM-7AM, please contact night-coverage [email or web address] 04/19/2022, 2:36 PM

## 2022-04-20 ENCOUNTER — Inpatient Hospital Stay (HOSPITAL_COMMUNITY): Payer: Medicare Other

## 2022-04-20 ENCOUNTER — Inpatient Hospital Stay: Payer: Self-pay

## 2022-04-20 DIAGNOSIS — Z87898 Personal history of other specified conditions: Secondary | ICD-10-CM | POA: Diagnosis not present

## 2022-04-20 DIAGNOSIS — D62 Acute posthemorrhagic anemia: Secondary | ICD-10-CM | POA: Insufficient documentation

## 2022-04-20 DIAGNOSIS — U071 COVID-19: Secondary | ICD-10-CM | POA: Diagnosis not present

## 2022-04-20 DIAGNOSIS — I38 Endocarditis, valve unspecified: Secondary | ICD-10-CM

## 2022-04-20 DIAGNOSIS — R9431 Abnormal electrocardiogram [ECG] [EKG]: Secondary | ICD-10-CM | POA: Insufficient documentation

## 2022-04-20 DIAGNOSIS — I959 Hypotension, unspecified: Secondary | ICD-10-CM | POA: Diagnosis not present

## 2022-04-20 DIAGNOSIS — R791 Abnormal coagulation profile: Secondary | ICD-10-CM | POA: Insufficient documentation

## 2022-04-20 DIAGNOSIS — T826XXD Infection and inflammatory reaction due to cardiac valve prosthesis, subsequent encounter: Secondary | ICD-10-CM

## 2022-04-20 DIAGNOSIS — Z8679 Personal history of other diseases of the circulatory system: Secondary | ICD-10-CM | POA: Diagnosis not present

## 2022-04-20 DIAGNOSIS — N179 Acute kidney failure, unspecified: Secondary | ICD-10-CM | POA: Diagnosis not present

## 2022-04-20 LAB — GLUCOSE, CAPILLARY
Glucose-Capillary: 178 mg/dL — ABNORMAL HIGH (ref 70–99)
Glucose-Capillary: 194 mg/dL — ABNORMAL HIGH (ref 70–99)
Glucose-Capillary: 192 mg/dL — ABNORMAL HIGH (ref 70–99)

## 2022-04-20 LAB — CK: Total CK: 104 U/L (ref 38–234)

## 2022-04-20 LAB — COMPREHENSIVE METABOLIC PANEL
ALT: <5 U/L (ref 0–44)
AST: 42 U/L — ABNORMAL HIGH (ref 15–41)
Albumin: 2.1 g/dL — ABNORMAL LOW (ref 3.5–5.0)
Alkaline Phosphatase: 59 U/L (ref 38–126)
Anion gap: 15 (ref 5–15)
BUN: 23 mg/dL (ref 8–23)
CO2: 18 mmol/L — ABNORMAL LOW (ref 22–32)
Calcium: 7.7 mg/dL — ABNORMAL LOW (ref 8.9–10.3)
Chloride: 103 mmol/L (ref 98–111)
Creatinine, Ser: 1.82 mg/dL — ABNORMAL HIGH (ref 0.44–1.00)
GFR, Estimated: 27 mL/min — ABNORMAL LOW (ref >60)
Glucose, Bld: 170 mg/dL — ABNORMAL HIGH (ref 70–99)
Potassium: 3.3 mmol/L — ABNORMAL LOW (ref 3.5–5.1)
Sodium: 136 mmol/L (ref 135–145)
Total Bilirubin: 0.8 mg/dL (ref 0.3–1.2)
Total Protein: 7 g/dL (ref 6.5–8.1)

## 2022-04-20 LAB — IRON AND TIBC
Iron: 37 ug/dL (ref 28–170)
Saturation Ratios: 17 % (ref 10.4–31.8)
TIBC: 223 ug/dL — ABNORMAL LOW (ref 250–450)
UIBC: 186 ug/dL

## 2022-04-20 LAB — VITAMIN B12: Vitamin B-12: 1471 pg/mL — ABNORMAL HIGH (ref 180–914)

## 2022-04-20 LAB — MAGNESIUM: Magnesium: 1.7 mg/dL (ref 1.7–2.4)

## 2022-04-20 LAB — HEPARIN LEVEL (UNFRACTIONATED): Heparin Unfractionated: 0.18 IU/mL — ABNORMAL LOW (ref 0.30–0.70)

## 2022-04-20 LAB — CBC
HCT: 23.8 % — ABNORMAL LOW (ref 36.0–46.0)
Hemoglobin: 7.9 g/dL — ABNORMAL LOW (ref 12.0–15.0)
MCH: 30.9 pg (ref 26.0–34.0)
MCHC: 33.2 g/dL (ref 30.0–36.0)
MCV: 93 fL (ref 80.0–100.0)
Platelets: 208 10*3/uL (ref 150–400)
RBC: 2.56 MIL/uL — ABNORMAL LOW (ref 3.87–5.11)
RDW: 14.3 % (ref 11.5–15.5)
WBC: 4.5 10*3/uL (ref 4.0–10.5)
nRBC: 0 % (ref 0.0–0.2)

## 2022-04-20 LAB — PROCALCITONIN: Procalcitonin: 0.3 ng/mL

## 2022-04-20 LAB — HEMOGLOBIN AND HEMATOCRIT, BLOOD
HCT: 23.7 % — ABNORMAL LOW (ref 36.0–46.0)
Hemoglobin: 7.8 g/dL — ABNORMAL LOW (ref 12.0–15.0)

## 2022-04-20 LAB — PHOSPHORUS: Phosphorus: 1.9 mg/dL — ABNORMAL LOW (ref 2.5–4.6)

## 2022-04-20 LAB — RETICULOCYTES
Immature Retic Fract: 27.3 % — ABNORMAL HIGH (ref 2.3–15.9)
RBC.: 2.54 MIL/uL — ABNORMAL LOW (ref 3.87–5.11)
Retic Count, Absolute: 38.9 10*3/uL (ref 19.0–186.0)
Retic Ct Pct: 1.5 % (ref 0.4–3.1)

## 2022-04-20 LAB — FERRITIN: Ferritin: 232 ng/mL (ref 11–307)

## 2022-04-20 LAB — PROTIME-INR
INR: 1.9 — ABNORMAL HIGH (ref 0.8–1.2)
Prothrombin Time: 21.3 seconds — ABNORMAL HIGH (ref 11.4–15.2)

## 2022-04-20 LAB — BRAIN NATRIURETIC PEPTIDE: B Natriuretic Peptide: 896.3 pg/mL — ABNORMAL HIGH (ref 0.0–100.0)

## 2022-04-20 LAB — FIBRINOGEN: Fibrinogen: 540 mg/dL — ABNORMAL HIGH (ref 210–475)

## 2022-04-20 LAB — APTT: aPTT: 134 seconds — ABNORMAL HIGH (ref 24–36)

## 2022-04-20 LAB — FOLATE: Folate: 20.1 ng/mL (ref >5.9)

## 2022-04-20 MED ORDER — CHLORHEXIDINE GLUCONATE CLOTH 2 % EX PADS
6.0000 | MEDICATED_PAD | Freq: Every day | CUTANEOUS | Status: DC
  Administered 2022-04-20 – 2022-04-24 (×6): 6 via TOPICAL

## 2022-04-20 MED ORDER — SODIUM CHLORIDE 0.9% FLUSH
10.0000 mL | INTRAVENOUS | Status: DC | PRN
  Administered 2022-04-24: 10 mL

## 2022-04-20 MED ORDER — POTASSIUM CHLORIDE CRYS ER 20 MEQ PO TBCR
40.0000 meq | EXTENDED_RELEASE_TABLET | Freq: Once | ORAL | Status: AC
  Administered 2022-04-20: 40 meq via ORAL
  Filled 2022-04-20: qty 2

## 2022-04-20 MED ORDER — ALBUTEROL SULFATE HFA 108 (90 BASE) MCG/ACT IN AERS
2.0000 | INHALATION_SPRAY | Freq: Two times a day (BID) | RESPIRATORY_TRACT | Status: DC
  Administered 2022-04-21 – 2022-04-24 (×6): 2 via RESPIRATORY_TRACT
  Filled 2022-04-20: qty 6.7

## 2022-04-20 MED ORDER — MOLNUPIRAVIR 200 MG PO CAPS
4.0000 | ORAL_CAPSULE | Freq: Two times a day (BID) | ORAL | Status: DC
  Filled 2022-04-20: qty 4

## 2022-04-20 MED ORDER — POTASSIUM PHOSPHATES 15 MMOLE/5ML IV SOLN
30.0000 mmol | Freq: Once | INTRAVENOUS | Status: AC
  Administered 2022-04-20: 30 mmol via INTRAVENOUS
  Filled 2022-04-20: qty 10

## 2022-04-20 MED ORDER — VITAMIN K1 10 MG/ML IJ SOLN
5.0000 mg | Freq: Once | INTRAVENOUS | Status: AC
  Administered 2022-04-20: 5 mg via INTRAVENOUS
  Filled 2022-04-20: qty 0.5

## 2022-04-20 MED ORDER — LORAZEPAM 1 MG PO TABS
1.0000 mg | ORAL_TABLET | Freq: Four times a day (QID) | ORAL | Status: DC | PRN
  Administered 2022-04-21 – 2022-04-24 (×7): 1 mg via ORAL
  Filled 2022-04-20 (×9): qty 1

## 2022-04-20 MED ORDER — SODIUM CHLORIDE 0.9% FLUSH
10.0000 mL | Freq: Two times a day (BID) | INTRAVENOUS | Status: DC
  Administered 2022-04-20: 20 mL
  Administered 2022-04-20 – 2022-04-24 (×8): 10 mL

## 2022-04-20 NOTE — Progress Notes (Signed)
Pt was given protamine '50mg'$  IV at 0016.

## 2022-04-20 NOTE — Progress Notes (Signed)
IVF D/Ced.

## 2022-04-20 NOTE — Final Progress Note (Signed)
Dr. Alcario Drought was made aware that Pt is on heparin drip at 9.5 units per hour. Pt is bleeding very badly from prior stick sites. Blood soaked kirlex wrap was change. She has pressure dressing 2 4x4 that she is soaking through.

## 2022-04-20 NOTE — Progress Notes (Signed)
Critical lab INR of 10.5 received from lab/ Gena Fray MD attending notified  result.

## 2022-04-20 NOTE — Progress Notes (Signed)
PHARMACY CONSULT NOTE FOR:  OUTPATIENT  PARENTERAL ANTIBIOTIC THERAPY (OPAT)  Informational only as noted plans to discharge back to SNF  Indication: MSSA MV IE Regimen: Cefazolin 2g IV every 8 hours End date: 05/11/22 (6 weeks from neg BCx on 12/28)  After 6 weeks of Cefazolin the patient will need chronic suppression - likely with Cefadroxil.  IV antibiotic discharge orders are pended. To discharging provider:  please sign these orders via discharge navigator,  Select New Orders & click on the button choice - Manage This Unsigned Work.    Thank you for allowing pharmacy to be a part of this patient's care.  Alycia Rossetti, PharmD, BCPS Infectious Diseases Clinical Pharmacist 04/20/2022 12:39 PM   **Pharmacist phone directory can now be found on [email or web address] ([email or web address]).  Listed under [place].

## 2022-04-20 NOTE — Final Progress Note (Addendum)
Dr. Alcario Drought was made aware that pt is still bleeding, Pt is having labored breathing coarse, tachycardia, and is bleeding from mouth. I asked Dr Alcario Drought to order the heparin reversal.

## 2022-04-20 NOTE — Final Progress Note (Signed)
Heparin drip was stopped. Dr. Alcario Drought came to see pt.

## 2022-04-20 NOTE — Progress Notes (Signed)
RT in to see pt for scheduled treatment. Upon arrival to room pt pulling telemetry leads off, oxygen off and attempting to climb out of bed. No respiratory distress noted. SpO2 87% on room air. Pt able to be redirected. Nasal cannula placed back on pt with increase in SpO2 to 96%. RN assisted RT and pt repositioned/scooted up in bed. No further needs at this time. RT will continue to monitor and be available as needed.

## 2022-04-20 NOTE — Progress Notes (Addendum)
[facility] night coverage note:  Just before protamine could be given, pt now with increased RR of 30, new 2L O2 requirement, coarse BS, and some blood in mouth.  Now concerned about possible pulmonary hemorrhage.  Protamine given. CXR ordered stat CBC also ordered  Update:  CXR actually being read as more of a volume overload picture perhaps: IMPRESSION: 1. Cardiomegaly with pulmonary vascular congestion. 2. Mild airspace disease bilaterally, possible edema versus pneumonia.  Will DC maint IVF as well Will hold off on lasix for the moment though given AKI, breathing improved when sitting up. Order lasix if breathing worsens again. BNP with AM labs

## 2022-04-20 NOTE — Progress Notes (Signed)
Transition of Care Department [facility]) following patient for high risk of readmission.   Patient admitted from [facility]. She had just recently been hospitalized from 03/28/2022-04/09/2022 with septic shock secondary to MSSA bacteremia 4 TEE showed 3 cm vegetation on TAVR valve 1/3.  Last blood culture from 12/28 did not show any growth.  She had a PICC line placed on 04/07/2022.  ID recommending continuation of Ancef through 05/11/2022 with rifampin 300 mg 3 times daily to continue until 05/13/2022.   Transition of Care Department [facility]) has reviewed patient and we will continue to monitor patient advancement through interdisciplinary progression rounds.

## 2022-04-20 NOTE — Consult Note (Addendum)
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   In reviewing her nausea it seems she has had that since last admission and given aki on presentation, concern this is rather more severe than average. I suspect the rifampin is inducing this. This was started in setting her cardiac device unable to be extracted. Given unclear benefit and limiting side effect will hold rifampin for now. Will finish cefazolin on 05/11/22 and continue suppressive abx with cefadroxil 1 gram bid indefinitely   Now that she is off rifampin, her previous DOAC can be resumed   Covid management per primary team   Plan: Stop rifampin Continue cefazolin as outlined above until 05/11/22, then start oral cefadroxil 1 gram twice daily indefinitely If needed ongoing anticoagulation can resume her DOAC in about another week from now Covid management per primary team F/u id clinic as below Discussed with primary team       OPAT Orders Discharge antibiotics to be given via PICC line Discharge antibiotics: Cefazolin 2 gram q8hours   Duration: 6 weeks  End Date: 05/11/22 then start po cefadroxil 1 gram bid indefinitely  PIC Care Per Protocol:  Home health RN for IV administration and teaching; PICC line care and labs.    Labs weekly while on IV antibiotics: _x_ CBC with  differential __ BMP _x_ CMP __ CRP __ ESR __ Vancomycin trough __ CK  __ Please pull PIC at completion of IV antibiotics __ Please leave PIC in place until doctor has seen patient or been notified  Fax weekly labs to [phone]  Clinic Follow Up Appt: 05/09/22 @ 1115 with dr Jessica [name].  Please bring SNF labs for the 2 weeks prior to clinic  @  [facility] [place], [place], [place] [address] Phone: [phone]   I spent 75 minute reviewing data/chart, and coordinating care and >50% direct face to face time providing counseling/discussing diagnostics/treatment plan with patient  ------------------------------------------------ Principal Problem:   AKI (acute kidney injury) (Jessica [name]) Active Problems:   Hypothyroidism   DM type 2, controlled, with complication (Jessica [name])   Anxiety disorder   History of endocarditis   Diarrhea   Chronic diastolic heart failure (HCC)   Atrial fibrillation and flutter (HCC)   Hypotension   History of bacteremia   COVID-19 virus infection   COPD with acute exacerbation (Jessica [name])   Dementia with behavioral disturbance (Jessica [name])   Delirium    HPI: Jessica [name] is a 89 y.o. 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 She wasn't a candidate for pacer extraction at that time  She was sent out on cefaz/rifampin (latter used as can't be extracted). However she has been having severe nausea since being on rifampin  2 days prior to this admission nursing home staff found her altered sitting in her chair in the room so she was brought in She was dx'ed with covid. Started on mild covid tx by primary team She has diarrhea and ongoing  nausea  Today she is much more alert and was able to accurately tell me temporal events since christmas of what is going on when I compared to chart review  Afebrile this admission Aki 2.6 -> 1.8 Blood cultures not taken Covid positive Started on monulpiravir by primary team Continued on cefazolin Rifampin currently being hold for concern of gi side effect   No rash, joint pain, headache Dry cough, dyspnea, weak, decreased appetite. Overall though some improvement in energy level  Lft checked today and only mildly elevated ast at 42     Family History  Problem Relation Age of Onset   Stroke Brother 35   Coronary artery disease Mother    Heart disease Mother 72   Stroke Father 48   Multiple myeloma Brother    Heart attack Son 39       Died of MI   Heart attack Daughter 64       Died of MI   Aneurysm Daughter 35       Question cerebral   Heart disease Maternal Grandmother    Heart disease Maternal Grandfather    Peripheral vascular disease Daughter        Amputation secondary to DM    Social History   Tobacco Use   Smoking status: Never   Smokeless tobacco: Never  Vaping Use   Vaping Use: Never used  Substance Use Topics   Alcohol use: No    Alcohol/week: 0.0 standard drinks of alcohol   Drug use: No    Allergies  Allergen Reactions   Coreg [Carvedilol] Other (See Comments)    Weak legs   Relafen [Nabumetone] Other (See Comments)    Upset stomach   Calcium Other (See Comments)    Unknown reaction   Codeine Other (See Comments)    Unknown reaction   Lipitor [Atorvastatin] Other (See Comments)    Myalgias   Pacerone [Amiodarone] Other (See Comments)    Hand tremors   Vasotec [Enalapril] Cough   Vioxx [Rofecoxib] Other (See Comments)    Unknown reaction   Zocor [Simvastatin] Other (See Comments)    Myalgias    Review of Systems: ROS All Other ROS was negative, except mentioned above   Past Medical History:  Diagnosis Date   Anxiety     Atrial fibrillation and flutter (Jessica [name])    detected by PPM interrogation (mostly atrial flutter)   AV block, 2nd degree 10/2012   s/p MDT ADDRL1 pacemaker implantation 10/10/2012 by [name]   Chronic diastolic heart failure (Jessica [name])    Depression    GERD (gastroesophageal reflux disease)    HH (hiatus hernia)    History of pulmonary embolism 10/2008   Bilateral   Hyperlipidemia    Hypertension    Hypothyroidism    Obesity    Osteoarthritis    Peripheral neuropathy    Right leg   Presence of permanent cardiac pacemaker    Pulmonary nodule    in the lingula. Noted on pre TAVR CT. Will require follow up   Renal insufficiency 2010   S/P TAVR (transcatheter aortic valve replacement)  Shingles 09/17/2014   right lower quadrant   Type II or unspecified type diabetes mellitus without mention of complication, not stated as uncontrolled 2009       Scheduled Meds:  albuterol  2 puff Inhalation Q6H   allopurinol  100 mg Oral Daily   vitamin C  500 mg Oral Daily   Chlorhexidine Gluconate Cloth  6 each Topical Daily   insulin aspart  0-5 Units Subcutaneous QHS   insulin aspart  0-9 Units Subcutaneous TID WC   levothyroxine  75 mcg Oral Q0600   magnesium oxide  400 mg Oral Daily   molnupiravir EUA  4 capsule Oral BID   molnupiravir EUA  4 capsule Oral BID   multivitamin with minerals  1 tablet Oral Daily   nystatin  1 Application Topical Daily   potassium chloride  40 mEq Oral Once   predniSONE  20 mg Oral Q breakfast   rosuvastatin  10 mg Oral Q M,W,F   saccharomyces boulardii  250 mg Oral BID   sodium chloride flush  3 mL Intravenous Q12H   temazepam  7.5 mg Oral QHS   venlafaxine XR  75 mg Oral Daily   zinc sulfate  220 mg Oral Daily   Continuous Infusions:   ceFAZolin (ANCEF) IV 2 g (04/20/22 0938)   phytonadione (VITAMIN K) 5 mg in dextrose 5 % 50 mL IVPB     potassium PHOSPHATE IVPB (in mmol)     PRN Meds:.acetaminophen **OR** acetaminophen, guaiFENesin-dextromethorphan,  LORazepam, polyvinyl alcohol   OBJECTIVE: Blood pressure 131/73, pulse (!) 111, temperature 97.6 F (36.4 C), temperature source Oral, resp. rate (!) 25, height '5\' 2"'$  (1.575 m), weight 111.6 kg, SpO2 93 %.  Physical Exam  General/constitutional: no distress, pleasant, coarse voise; conversant; cooperative; alert/oriented HEENT: Normocephalic, PER, Conj Clear, EOMI, Oropharynx clear Neck supple CV: rrr no mrg Lungs: rales/upper airway sound, normal respiratory effort Abd: Soft, Nontender Ext: no edema Skin: No Rash Neuro: nonfocal MSK: no peripheral joint swelling/tenderness/warmth; back spines nontender   Central line presence: rue picc site no erythema/purulence    Lab Results Lab Results  Component Value Date   WBC 4.5 04/20/2022   HGB 7.9 (L) 04/20/2022   HCT 23.8 (L) 04/20/2022   MCV 93.0 04/20/2022   PLT 208 04/20/2022    Lab Results  Component Value Date   CREATININE 1.82 (H) 04/20/2022   BUN 23 04/20/2022   NA 136 04/20/2022   K 3.3 (L) 04/20/2022   CL 103 04/20/2022   CO2 18 (L) 04/20/2022    Lab Results  Component Value Date   ALT <5 04/20/2022   AST 42 (H) 04/20/2022   ALKPHOS 59 04/20/2022   BILITOT 0.8 04/20/2022      Microbiology: Recent Results (from the past 240 hour(s))  Resp panel by RT-PCR (RSV, Flu A&B, Covid) Anterior Nasal Swab     Status: Abnormal   Collection Time: 04/18/22  2:05 PM   Specimen: Anterior Nasal Swab  Result Value Ref Range Status   SARS Coronavirus 2 by RT PCR POSITIVE (A) NEGATIVE Final    Comment: (NOTE) SARS-CoV-2 target nucleic acids are DETECTED.  The SARS-CoV-2 RNA is generally detectable in upper respiratory specimens during the acute phase of infection. Positive results are indicative of the presence of the identified virus, but do not rule out bacterial infection or co-infection with other pathogens not detected by the test. Clinical correlation with patient history and other diagnostic information is  necessary to determine patient infection  status. The expected result is Negative.  Fact Sheet for Patients: EntrepreneurPulse.com.au  Fact Sheet for Healthcare Providers: IncredibleEmployment.be  This test is not yet approved or cleared by the [place] FDA and  has been authorized for detection and/or diagnosis of SARS-CoV-2 by FDA under an Emergency Use Authorization (EUA).  This EUA will remain in effect (meaning this test can be used) for the duration of  the COVID-19 declaration under Section 564(b)(1) of the A ct, 21 U.S.C. section 360bbb-3(b)(1), unless the authorization is terminated or revoked sooner.     Influenza A by PCR NEGATIVE NEGATIVE Final   Influenza B by PCR NEGATIVE NEGATIVE Final    Comment: (NOTE) The Xpert Xpress SARS-CoV-2/FLU/RSV plus assay is intended as an aid in the diagnosis of influenza from Nasopharyngeal swab specimens and should not be used as a sole basis for treatment. Nasal washings and aspirates are unacceptable for Xpert Xpress SARS-CoV-2/FLU/RSV testing.  Fact Sheet for Patients: EntrepreneurPulse.com.au  Fact Sheet for Healthcare Providers: IncredibleEmployment.be  This test is not yet approved or cleared by the [place] FDA and has been authorized for detection and/or diagnosis of SARS-CoV-2 by FDA under an Emergency Use Authorization (EUA). This EUA will remain in effect (meaning this test can be used) for the duration of the COVID-19 declaration under Section 564(b)(1) of the Act, 21 U.S.C. section 360bbb-3(b)(1), unless the authorization is terminated or revoked.     Resp Syncytial Virus by PCR NEGATIVE NEGATIVE Final    Comment: (NOTE) Fact Sheet for Patients: EntrepreneurPulse.com.au  Fact Sheet for Healthcare Providers: IncredibleEmployment.be  This test is not yet approved or cleared by the [place] FDA  and has been authorized for detection and/or diagnosis of SARS-CoV-2 by FDA under an Emergency Use Authorization (EUA). This EUA will remain in effect (meaning this test can be used) for the duration of the COVID-19 declaration under Section 564(b)(1) of the Act, 21 U.S.C. section 360bbb-3(b)(1), unless the authorization is terminated or revoked.  Performed at [facility], [place] [address] [place], [place] [address]      Serology:    Imaging: If present, new imagings (plain films, ct scans, and mri) have been personally visualized and interpreted; radiology reports have been reviewed. Decision making incorporated into the Impression / Recommendations.  1/18 cxr  1. Cardiomegaly with pulmonary vascular congestion. 2. Mild airspace disease bilaterally, possible edema versus pneumonia.    Jabier Mutton, [facility] [place] [phone] pager    04/20/2022, 12:48 PM

## 2022-04-20 NOTE — Evaluation (Signed)
Occupational Therapy Evaluation Patient Details Name: Jessica [name] MRN: [id number] DOB: 10-30-1933 Today's Date: 04/20/2022   History of Present Illness 89 y/o F admitted to [facility] on 1/16 for bleeding from L arm, ongoing for 2 days. Pt with a history of dementia, recently hospitalized 12/26-1/7 with septic shock, with TEE showing vegetation on TAVR, medical management recommended. Pt COVID positive. PMHx: CHF, a-fib, second degree AV block s/p PPM, aortic stenosis s/p TAVR, anxiety, depression, hypothryoidism, GERD.   Clinical Impression   Patient admitted for the diagnosis above.  Patient was at a local SNF undergoing short term rehab with the eventual goal of returning home.  Currently she is needing up to Max A for basic transfers and lower body ADL at bed level.  Patient recently returned to bed, and thus out of bed not assessed.  OT will follow in the acute setting to address deficits listed below, and assist with the transition to SNF for continued rehab prior to returning home.         Recommendations for follow up therapy are one component of a multi-disciplinary discharge planning process, led by the attending physician.  Recommendations may be updated based on patient status, additional functional criteria and insurance authorization.   Follow Up Recommendations  Skilled nursing-short term rehab (<3 hours/day)     Assistance Recommended at Discharge Frequent or constant Supervision/Assistance  Patient can return home with the following Assistance with cooking/housework;Direct supervision/assist for medications management;Direct supervision/assist for financial management;Help with stairs or ramp for entrance;Assist for transportation;A lot of help with bathing/dressing/bathroom;A lot of help with walking and/or transfers    Functional Status Assessment  Patient has had a recent decline in their functional status and demonstrates the ability to make significant improvements in  function in a reasonable and predictable amount of time.  Equipment Recommendations  BSC/3in1    Recommendations for Other Services       Precautions / Restrictions Precautions Precautions: Fall Precaution Comments: watch O2 Restrictions Weight Bearing Restrictions: No      Mobility Bed Mobility Overal bed mobility: Needs Assistance Bed Mobility: Supine to Sit, Sit to Supine     Supine to sit: Mod assist, HOB elevated Sit to supine: Mod assist, Max assist   General bed mobility comments: pt able to assist with legs off the bed and cues provided for use of bed rails. Assist with trunk, and ModA to Max A to assist legs and trunk back to supine.    Transfers                   General transfer comment: Out of bed deferred, patient recently back to bed, and MD in the room.      Balance Overall balance assessment: Needs assistance Sitting-balance support: Feet supported, No upper extremity supported Sitting balance-Leahy Scale: Fair Sitting balance - Comments: min guard to close supervision                                   ADL either performed or assessed with clinical judgement   ADL Overall ADL's : Needs assistance/impaired Eating/Feeding: Supervision/ safety;Bed level   Grooming: Wash/dry face;Set up;Bed level   Upper Body Bathing: Minimal assistance;Bed level   Lower Body Bathing: Maximal assistance;Bed level   Upper Body Dressing : Minimal assistance;Bed level   Lower Body Dressing: Maximal assistance;Bed level  Vision   Vision Assessment?: No apparent visual deficits     Perception     Praxis      Pertinent Vitals/Pain Pain Assessment Pain Assessment: Faces Faces Pain Scale: Hurts a little bit Pain Location: generalized with movement Pain Descriptors / Indicators: Discomfort Pain Intervention(s): Monitored during session     Hand Dominance Right   Extremity/Trunk Assessment Upper Extremity  Assessment Upper Extremity Assessment: Generalized weakness   Lower Extremity Assessment Lower Extremity Assessment: Defer to PT evaluation   Cervical / Trunk Assessment Cervical / Trunk Assessment: Other exceptions Cervical / Trunk Exceptions: increased body habitus   Communication Communication Communication: No difficulties   Cognition Arousal/Alertness: Awake/alert Behavior During Therapy: Restless Overall Cognitive Status: Impaired/Different from baseline Area of Impairment: Orientation, Attention, Problem solving, Awareness, Following commands, Memory                 Orientation Level: Disoriented to, Situation Current Attention Level: Selective Memory: Decreased short-term memory Following Commands: Follows one step commands consistently   Awareness: Emergent Problem Solving: Requires verbal cues       General Comments   Patient desat's with activity.    Exercises     Shoulder Instructions      Home Living Family/patient expects to be discharged to:: Skilled nursing facility Living Arrangements: Alone Available Help at Discharge: Family;Available PRN/intermittently;Friend(s) Type of Home: House Home Access: Stairs to enter [employer] of Steps: 2 Entrance Stairs-Rails: Left Home Layout: Multi-level;Able to live on main level with bedroom/bathroom Alternate Level Stairs-Number of Steps: Tri level home   Bathroom Shower/Tub: Sponge bathes at baseline   Bathroom Toilet: Standard     Home Equipment: Conservation officer, nature (2 wheels);Cane - single point          Prior Functioning/Environment Prior Level of Function : Independent/Modified Independent             Mobility Comments: pt reports using RW on "days where I feel weak or dizzy", otherwise furniture walks ADLs Comments: pt reports independence with dressing, bathing, and cooking. Pt stopped driving 6 months ago, neighbors/family take her to appointments/grocery store        OT  Problem List: Decreased strength;Decreased activity tolerance;Impaired balance (sitting and/or standing);Decreased cognition;Decreased safety awareness;Decreased knowledge of use of DME or AE;Pain;Cardiopulmonary status limiting activity;Obesity      OT Treatment/Interventions: Self-care/ADL training;Therapeutic exercise;DME and/or AE instruction;Therapeutic activities;Patient/family education;Balance training;Cognitive remediation/compensation    OT Goals(Current goals can be found in the care plan section) Acute Rehab OT Goals Patient Stated Goal: Return home to her cat Zeus OT Goal Formulation: With patient Time For Goal Achievement: 05/04/22 Potential to Achieve Goals: Fair ADL Goals Pt Will Perform Grooming: with set-up;sitting Pt Will Perform Upper Body Bathing: with set-up;sitting Pt Will Perform Upper Body Dressing: with set-up;sitting Pt Will Transfer to Toilet: with min assist;stand pivot transfer;bedside commode Pt/caregiver will Perform Home Exercise Program: Increased strength;Both right and left upper extremity;With Supervision  OT Frequency: Min 2X/week    Co-evaluation              AM-PAC OT "6 Clicks" Daily Activity     Outcome Measure Help from another person eating meals?: A Little Help from another person taking care of personal grooming?: A Little Help from another person toileting, which includes using toliet, bedpan, or urinal?: A Lot Help from another person bathing (including washing, rinsing, drying)?: A Lot Help from another person to put on and taking off regular upper body clothing?: A Little Help from another person to  put on and taking off regular lower body clothing?: A Lot 6 Click Score: 15   End of Session Equipment Utilized During Treatment: Oxygen Nurse Communication: Mobility status  Activity Tolerance: Patient limited by fatigue Patient left: in bed;with call bell/phone within reach;with bed alarm set  OT Visit Diagnosis: Other  abnormalities of gait and mobility (R26.89);Muscle weakness (generalized) (M62.81);Dizziness and giddiness (R42)                Time: 1351-1410 OT Time Calculation (min): 19 min Charges:  OT General Charges $OT Visit: 1 Visit OT Evaluation $OT Eval Moderate Complexity: 1 Mod  04/20/2022  RP, OTR/L  Acute Rehabilitation Services  Office:  [phone]   Metta Clines 04/20/2022, 2:21 PM

## 2022-04-20 NOTE — Progress Notes (Addendum)
PROGRESS NOTE  Jessica [name] PTW:[id number] DOB: 05/01/1933   PCP: [name], MD  Patient is from: SNF DOA: 04/18/2022 LOS: 2  Chief complaints Chief Complaint  Patient presents with   left arm pain     Brief Narrative / Interim history: 89 year old F with PMH of diastolic CHF, A-fib/flutter, AVB s/p PPM, AS s/p TAVR, DM-2, anxiety, depression, hypothyroidism, GERD, cognitive impairment, and recent hospitalization from 12/26-1/7 with septic shock due to MSSA bacteremia and TAVR endocarditis on Ancef and rifampin presenting with diarrhea, poor p.o. intake, nausea and bleeding from left arm lesion, and admitted for AKI, hypotension and  COPD exacerbation in the setting of recent COVID-19 infection. Cr 2.6 (baseline 0.8).  CXR with improved aeration of the lung bases with persistent left > right bibasilar renal opacities.  COVID-19 screen positive.  Received IV fluid bolus and started on maintenance IV and admitted.  AKI improved but patient developed respiratory distress.  IV fluid discontinued.  Also bleeding from left arm and mouth.  Left arm bleeding controlled with pressure dressing.  Oral bleeding symptoms subsided. INR elevated to 10.5.  Patient was on rifampin for MSSA bacteremia.  She was also on LAGEVRIO COVID-19 which is discontinued.  Subjective: Seen and examined earlier this morning.  Patient had an episode of bleeding from left arm that was controlled with pressure dressing.  She also had some bleeding from the mouth that seems to have stopped.  She had increased RR and O2 requirement with some respiratory distress.  CXR with pulmonary edema.  IV fluid discontinued.  Now feels better other than some nausea and pain in her bottom.  Objective: Vitals:   04/19/22 1849 04/19/22 1926 04/19/22 2000 04/20/22 1216  BP:   (!) 126/54 131/73  Pulse:  93 100 (!) 111  Resp:  (!) 23 20 (!) 25  Temp:   98.2 F (36.8 C) 97.6 F (36.4 C)  TempSrc:   Oral Oral  SpO2:  91%  90% 93%  Weight: 111.6 kg     Height: '5\' 2"'$  (1.575 m)       Examination:  GENERAL: No apparent distress.  Nontoxic. HEENT: Dried crusted blood and oral mucosa.  No active extravasation.  NECK: Supple.  No apparent JVD.  RESP:  No IWOB.  Fair aeration bilaterally.  Bibasilar crackles. CVS:  RRR. Heart sounds normal.  ABD/GI/GU: BS+. Abd soft, NTND.  MSK/EXT:  Moves extremities.  Pressure dressing over left forearm. SKIN: no apparent skin lesion or wound NEURO: Awake and alert. Oriented x 4 except.  Limited insight.  No apparent focal neuro deficit. PSYCH: Calm. Normal affect.   Procedures:  None  Microbiology summarized: 1/16-COVID-19 PCR positive.  She was also positive at facility before admission. 1/16-influenza and RSV PCR nonreactive.  Assessment and plan: Principal Problem:   AKI (acute kidney injury) ([place]) Active Problems:   Hypotension   History of endocarditis   History of bacteremia   COVID-19 virus infection   COPD with acute exacerbation (HCC)   Diarrhea   Chronic diastolic heart failure ([place])   DM type 2, controlled, with complication (HCC)   Atrial fibrillation and flutter (HCC)   Anxiety disorder   Hypothyroidism   Dementia with behavioral disturbance ([place])   Delirium  Acute kidney injury: Suspect prerenal due to poor p.o. intake, diarrhea and concurrent use of diuretics and Entresto.  Improving. Recent Labs    03/31/22 1053 04/02/22 0037 04/03/22 7001 04/05/22 0044 04/06/22 7494 04/08/22 0448 04/09/22 0603 04/18/22 1154 04/19/22  0500 04/20/22 1025  BUN '9 12 8 '$ 7* 7* 8 9 35* 34* 23  CREATININE 0.74 0.77 0.64 0.70 0.75 0.91 0.82 2.60* 2.46* 1.82*  -Continue holding IV fluid due to respiratory distress. -Encourage oral hydration  Acute on chronic combined CHF with recovered EF:  Recent TTE with LVEF of 55 to 60% and G2 DD.  Had EF of 30 to 35% in 2019.  Appears dehydrated on exam but CXR with pulmonary edema and BNP elevated likely from IV  fluid.[name] -IV fluid discontinued -Continue home getting diuretics and Entresto in the setting of AKI. -Closely monitor respiratory and fluid status while on IV fluid  ABLA due to left forearm bleeding/oral bleeding due to elevated INR.  INR 10.8.  Not on warfarin.  No history of liver cirrhosis.  Her recent CT reassuring.  DIC panel negative.  Suspect elevated INR to be due to interaction between rifampin and LAGERVIO.  She was on therapeutic dose Lovenox for paroxysmal A-fib.  Bleeding seems to have subsided. Recent Labs    03/29/22 0522 03/30/22 0347 03/31/22 1053 04/02/22 0037 04/03/22 0035 04/05/22 0044 04/08/22 0448 04/18/22 1154 04/19/22 0500 04/20/22 1025  HGB 10.2* 10.6* 10.4* 12.9 10.1* 10.0* 9.7* 9.4* 8.2* 7.9*  -Lovenox discontinued. -Both rifampin and LAGERVIO discontinued.  -IV vitamin K 5 mg x 1 -Recheck PT/INR in the evening -Monitor CBC.   Hypotension: Likely due to dehydration and concurrent use of antihypertensive meds.  Resolved. -Continue holding antihypertensive meds   MSSA bacteremia/TAVR endocarditis: -New ID rec -Continue IV Ancef through 05/11/2022 followed by p.o. cefadroxil indefinitely -Discontinue rifampin.  Okay to resume Eliquis a week later -Okay to resume DOAC a week later    COVID-19 infection: Likely contributing to poor p.o. intake, diarrhea and AKI.  Tested positive at facility.  She had been started on molnupiravir 800 mg p.o. twice daily on 1/12 to be continued until 1/17. -Supportive care with inhalers, vitamin C, zinc, antitussive and mucolytic's. -Airborne precautions -Discontinued LAGERVIO due to concern for interaction with rifampin causing elevated INR of bleeding   COPD, with acute exacerbation: Likely due to COVID-19 infection.  Seems to have improved.   -Given her delirium, will wean off steroid quickly.  Decreased to 20 mg daily -Albuterol inhaler   Diarrhea: Seems to have resolved.  She was unable to provide stool samples for  C. difficile   Uncontrolled NIDDM-2 with hyperglycemia: A1c 8.4% (10% on 12/26/2021) Recent Labs  Lab 04/19/22 0823 04/19/22 1227 04/19/22 1725 04/19/22 2309 04/20/22 1216  GLUCAP 107* 147* 156* 139* 178*  -Continue current insulin regimen -Recheck hemoglobin A1c   Paroxsymal atrial fibrillation/flutter Second-degree AV block s/p PPM -Continue holding Lopressor -Hold Lovenox in the setting of bleeding -Can resume home Eliquis on 1/25 now she is off rifampin.   Delirium/cognitive impairment with behavioral disturbance: She was agitated requiring Depakote and restraints.  Looks calmer today.  Son-in-law at bedside.  She is oriented x 4 except date but very limited insight. -Wean off steroid as quick as possible -Reorientation and delirium precautions -Fall precautions  Anxiety and depression -Continue venlafaxine, Ativan as needed, and Restoril nightly    Hypothyroidism -Continue levothyroxine  Hypokalemia/hypomagnesemia: -Monitor replenish as appropriate.  Metabolic acidosis: Likely due to renal failure and IV fluid. -Recheck in the morning Body mass index is 45 kg/m.           DVT prophylaxis:  SCD for VTE prophylaxis due to bleeding.  Code Status: DNR/DNI Family Communication: None at bedside. Level of care:  Progressive Status is: Inpatient Remains inpatient appropriate because: AKI, acute CHF, delirium, bleeding and supratherapeutic INR   Final disposition: SNF Consultants:  None  55 minutes with more than 50% spent in reviewing records, counseling patient/family and coordinating care.   Sch Meds:  Scheduled Meds:  albuterol  2 puff Inhalation Q6H   allopurinol  100 mg Oral Daily   vitamin C  500 mg Oral Daily   Chlorhexidine Gluconate Cloth  6 each Topical Daily   insulin aspart  0-5 Units Subcutaneous QHS   insulin aspart  0-9 Units Subcutaneous TID WC   levothyroxine  75 mcg Oral Q0600   magnesium oxide  400 mg Oral Daily   molnupiravir EUA   4 capsule Oral BID   multivitamin with minerals  1 tablet Oral Daily   nystatin  1 Application Topical Daily   predniSONE  20 mg Oral Q breakfast   rosuvastatin  10 mg Oral Q M,W,F   saccharomyces boulardii  250 mg Oral BID   sodium chloride flush  10-40 mL Intracatheter Q12H   sodium chloride flush  3 mL Intravenous Q12H   temazepam  7.5 mg Oral QHS   venlafaxine XR  75 mg Oral Daily   zinc sulfate  220 mg Oral Daily   Continuous Infusions:   ceFAZolin (ANCEF) IV 2 g (04/20/22 4008)   phytonadione (VITAMIN K) 5 mg in dextrose 5 % 50 mL IVPB 5 mg (04/20/22 1315)   potassium PHOSPHATE IVPB (in mmol)     PRN Meds:.acetaminophen **OR** acetaminophen, guaiFENesin-dextromethorphan, LORazepam, polyvinyl alcohol, sodium chloride flush  Antimicrobials: Anti-infectives (From admission, onward)    Start     Dose/Rate Route Frequency Ordered Stop   04/20/22 1115  molnupiravir EUA (LAGEVRIO) capsule 800 mg  Status:  Discontinued        4 capsule Oral 2 times daily 04/20/22 1018 04/20/22 1255   04/18/22 2200  molnupiravir EUA (LAGEVRIO) capsule 800 mg        4 capsule Oral 2 times daily 04/18/22 1717 04/20/22 2159   04/18/22 2200  ceFAZolin (ANCEF) IVPB 2g/100 mL premix        2 g 200 mL/hr over 30 Minutes Intravenous Every 12 hours 04/18/22 1915     04/18/22 1715  ceFAZolin (ANCEF) IVPB  Status:  Discontinued       Note to Pharmacy: Indication:  MSSA PVE First Dose: Yes Last Day of Therapy:  05/11/22 Labs - Once weekly:  CBC/D and CMP, Labs - Every other week:  ESR and CRP Method of administration: IV Push Pull PICC line at the completion of IV antibiotics Method of administration may be changed at the discretion of home i   2 g Intravenous Every 8 hours 04/18/22 1703 04/18/22 1915   04/18/22 1715  rifampin (RIFADIN) capsule 300 mg  Status:  Discontinued       Note to Pharmacy: Stop date 05/11/22     300 mg Oral 3 times daily 04/18/22 1703 04/20/22 1238        I have personally  reviewed the following labs and images: CBC: Recent Labs  Lab 04/18/22 1154 04/19/22 0500 04/20/22 1025 04/20/22 1300  WBC 4.3 3.7* 4.5  --   NEUTROABS 1.8  --   --   --   HGB 9.4* 8.2* 7.9*  --   HCT 29.2* 25.0* 23.8*  --   MCV 95.4 93.3 93.0  --   PLT 219 200 208 PENDING   BMP &GFR Recent Labs  Lab  04/18/22 1154 04/19/22 0500 04/20/22 1025  NA 132* 134* 136  K 3.6 3.4* 3.3*  CL 97* 101 103  CO2 24 19* 18*  GLUCOSE 133* 131* 170*  BUN 35* 34* 23  CREATININE 2.60* 2.46* 1.82*  CALCIUM 8.3* 7.7* 7.7*  MG  --   --  1.7  PHOS  --   --  1.9*   Estimated Creatinine Clearance: 26.2 mL/min (A) (by C-G formula based on SCr of 1.82 mg/dL (H)). Liver & Pancreas: Recent Labs  Lab 04/20/22 1025  AST 42*  ALT <5  ALKPHOS 59  BILITOT 0.8  PROT 7.0  ALBUMIN 2.1*   No results for input(s): "LIPASE", "AMYLASE" in the last 168 hours. No results for input(s): "AMMONIA" in the last 168 hours. Diabetic: Recent Labs    04/19/22 0500  HGBA1C 8.4*   Recent Labs  Lab 04/19/22 0823 04/19/22 1227 04/19/22 1725 04/19/22 2309 04/20/22 1216  GLUCAP 107* 147* 156* 139* 178*   Cardiac Enzymes: Recent Labs  Lab 04/20/22 1025  CKTOTAL 104   No results for input(s): "PROBNP" in the last 8760 hours. Coagulation Profile: Recent Labs  Lab 04/20/22 1025 04/20/22 1300  INR 10.5* 10.8*   Thyroid Function Tests: No results for input(s): "TSH", "T4TOTAL", "FREET4", "T3FREE", "THYROIDAB" in the last 72 hours. Lipid Profile: No results for input(s): "CHOL", "HDL", "LDLCALC", "TRIG", "CHOLHDL", "LDLDIRECT" in the last 72 hours. Anemia Panel: Recent Labs    04/20/22 1025  VITAMINB12 1,471*  FOLATE 20.1  FERRITIN 232  TIBC 223*  IRON 37  RETICCTPCT 1.5   Urine analysis:    Component Value Date/Time   COLORURINE AMBER (A) 04/18/2022 2244   APPEARANCEUR HAZY (A) 04/18/2022 2244   LABSPEC 1.011 04/18/2022 2244   PHURINE 5.0 04/18/2022 2244   GLUCOSEU NEGATIVE 04/18/2022  2244   GLUCOSEU NEGATIVE 09/07/2020 1244   HGBUR MODERATE (A) 04/18/2022 2244   BILIRUBINUR NEGATIVE 04/18/2022 2244   KETONESUR NEGATIVE 04/18/2022 2244   PROTEINUR 30 (A) 04/18/2022 2244   UROBILINOGEN 0.2 09/07/2020 1244   NITRITE NEGATIVE 04/18/2022 2244   LEUKOCYTESUR MODERATE (A) 04/18/2022 2244   Sepsis Labs: Invalid input(s): "PROCALCITONIN", "LACTICIDVEN"  Microbiology: Recent Results (from the past 240 hour(s))  Resp panel by RT-PCR (RSV, Flu A&B, Covid) Anterior Nasal Swab     Status: Abnormal   Collection Time: 04/18/22  2:05 PM   Specimen: Anterior Nasal Swab  Result Value Ref Range Status   SARS Coronavirus 2 by RT PCR POSITIVE (A) NEGATIVE Final    Comment: (NOTE) SARS-CoV-2 target nucleic acids are DETECTED.  The SARS-CoV-2 RNA is generally detectable in upper respiratory specimens during the acute phase of infection. Positive results are indicative of the presence of the identified virus, but do not rule out bacterial infection or co-infection with other pathogens not detected by the test. Clinical correlation with patient history and other diagnostic information is necessary to determine patient infection status. The expected result is Negative.  Fact Sheet for Patients: EntrepreneurPulse.com.au  Fact Sheet for Healthcare Providers: IncredibleEmployment.be  This test is not yet approved or cleared by the [place] FDA and  has been authorized for detection and/or diagnosis of SARS-CoV-2 by FDA under an Emergency Use Authorization (EUA).  This EUA will remain in effect (meaning this test can be used) for the duration of  the COVID-19 declaration under Section 564(b)(1) of the A ct, 21 U.S.C. section 360bbb-3(b)(1), unless the authorization is terminated or revoked sooner.     Influenza A by PCR  NEGATIVE NEGATIVE Final   Influenza B by PCR NEGATIVE NEGATIVE Final    Comment: (NOTE) The Xpert Xpress  SARS-CoV-2/FLU/RSV plus assay is intended as an aid in the diagnosis of influenza from Nasopharyngeal swab specimens and should not be used as a sole basis for treatment. Nasal washings and aspirates are unacceptable for Xpert Xpress SARS-CoV-2/FLU/RSV testing.  Fact Sheet for Patients: EntrepreneurPulse.com.au  Fact Sheet for Healthcare Providers: IncredibleEmployment.be  This test is not yet approved or cleared by the [place] FDA and has been authorized for detection and/or diagnosis of SARS-CoV-2 by FDA under an Emergency Use Authorization (EUA). This EUA will remain in effect (meaning this test can be used) for the duration of the COVID-19 declaration under Section 564(b)(1) of the Act, 21 U.S.C. section 360bbb-3(b)(1), unless the authorization is terminated or revoked.     Resp Syncytial Virus by PCR NEGATIVE NEGATIVE Final    Comment: (NOTE) Fact Sheet for Patients: EntrepreneurPulse.com.au  Fact Sheet for Healthcare Providers: IncredibleEmployment.be  This test is not yet approved or cleared by the [place] FDA and has been authorized for detection and/or diagnosis of SARS-CoV-2 by FDA under an Emergency Use Authorization (EUA). This EUA will remain in effect (meaning this test can be used) for the duration of the COVID-19 declaration under Section 564(b)(1) of the Act, 21 U.S.C. section 360bbb-3(b)(1), unless the authorization is terminated or revoked.  Performed at [facility], [place] [address] [place], [place] [address]     Radiology Studies: DG CHEST PORT 1 VIEW  Result Date: 04/20/2022 CLINICAL DATA:  Shortness of breath. EXAM: PORTABLE CHEST 1 VIEW COMPARISON:  04/18/2022. FINDINGS: Heart is enlarged and the mediastinal contour stable. A TAVR stent is noted. There is atherosclerotic calcification of the aorta. The pulmonary vasculature is distended. Mild airspace disease is  noted bilaterally. No effusion or pneumothorax. A dual lead pacemaker is present over the left chest. No acute osseous abnormality. IMPRESSION: 1. Cardiomegaly with pulmonary vascular congestion. 2. Mild airspace disease bilaterally, possible edema versus pneumonia. Electronically Signed   By: Brett Fairy M.D.   On: 04/20/2022 00:46      Solmon Bohr T. Tuttle  If 7PM-7AM, please contact night-coverage [email or web address] 04/20/2022, 2:02 PM

## 2022-04-20 NOTE — Evaluation (Signed)
Physical Therapy Evaluation Patient Details Name: Jessica [name] MRN: [id number] DOB: [birth year] Today's Date: 04/20/2022  History of Present Illness  89 y/o F admitted to [facility] on 1/16 for bleeding from L arm, ongoing for 2 days. Pt with a history of dementia, recently hospitalized 12/26-1/7 with septic shock, with TEE showing vegetation on TAVR, medical management recommended. Pt COVID positive. PMHx: CHF, a-fib, second degree AV block s/p PPM, aortic stenosis s/p TAVR, anxiety, depression, hypothryoidism, GERD.  Clinical Impression  Pt presents today with impaired functional mobility, balance, strength, activity tolerance, and cognition. Pt is a questionable historian, but per last admission, pt recently requiring assistance with all mobility. Pt able to perform a stand pivot transfer to bedside chair with mod-maxA and cueing for safety, as pt has poor safety awareness, noted to be impulsive. Pt will continue to benefit from skilled acute PT at this time to progress mobility and address deficits, recommend SNF at discharge.        Recommendations for follow up therapy are one component of a multi-disciplinary discharge planning process, led by the attending physician.  Recommendations may be updated based on patient status, additional functional criteria and insurance authorization.  Follow Up Recommendations Skilled nursing-short term rehab (<3 hours/day) Can patient physically be transported by private vehicle: No    Assistance Recommended at Discharge Frequent or constant Supervision/Assistance  Patient can return home with the following  A lot of help with walking and/or transfers;Assistance with cooking/housework;Direct supervision/assist for medications management;Direct supervision/assist for financial management;Assist for transportation;Help with stairs or ramp for entrance    Equipment Recommendations Other (comment) (defer to next level of care)  Recommendations for Other  Services       Functional Status Assessment Patient has had a recent decline in their functional status and demonstrates the ability to make significant improvements in function in a reasonable and predictable amount of time.     Precautions / Restrictions Precautions Precautions: Fall Precaution Comments: COVID Restrictions Weight Bearing Restrictions: No      Mobility  Bed Mobility Overal bed mobility: Needs Assistance Bed Mobility: Supine to Sit     Supine to sit: Mod assist, HOB elevated     General bed mobility comments: use of bed rails, pt requiring verbal cues for sequencing and getting BLE off bed, assist for pulling up into sitting    Transfers Overall transfer level: Needs assistance Equipment used: 1 person hand held assist Transfers: Sit to/from Stand, Bed to chair/wheelchair/BSC Sit to Stand: Mod assist Stand pivot transfers: Max assist         General transfer comment: pt performing stand pivot transfer to bedside chair, sequencing through verbal cues provided for pt as noted to be impulsive    Ambulation/Gait               General Gait Details: pt declining attempts  Stairs            Wheelchair Mobility    Modified Rankin (Stroke Patients Only)       Balance Overall balance assessment: Needs assistance Sitting-balance support: Feet supported, Bilateral upper extremity supported Sitting balance-Leahy Scale: Fair Sitting balance - Comments: min guard for safety, no sway noted   Standing balance support: Bilateral upper extremity supported, During functional activity Standing balance-Leahy Scale: Poor Standing balance comment: assist provided for safety and balance, pt with limited standing tolerance  Pertinent Vitals/Pain Pain Assessment Pain Assessment:  (pt reports "bad enough" when asked to rate) Pain Location: R hip Pain Descriptors / Indicators: Aching Pain Intervention(s):  Monitored during session    Home Living Family/patient expects to be discharged to:: Skilled nursing facility Living Arrangements: Alone Available Help at Discharge: Family;Friend(s);Available PRN/intermittently Type of Home: House Home Access: Stairs to enter Entrance Stairs-Rails: Left Entrance Stairs-Number of Steps: 2 Alternate Level Stairs-Number of Steps: pt reports 3 story house but stay on main level Home Layout: Multi-level;Able to live on main level with bedroom/bathroom Home Equipment: Rolling Walker (2 wheels);Cane - single point      Prior Function Prior Level of Function : Independent/Modified Independent             Mobility Comments: pt reports utilizing RW at home, reports she was admitted from home but, per chart review, pt was at a SNF prior to admission ADLs Comments: pt reports independence with dressing, bathing, and cooking. Pt stopped driving 6 months ago, neighbors/family take her to appointments/grocery store     Hand Dominance   Dominant Hand: Right    Extremity/Trunk Assessment   Upper Extremity Assessment Upper Extremity Assessment: Defer to OT evaluation    Lower Extremity Assessment Lower Extremity Assessment: Generalized weakness    Cervical / Trunk Assessment Cervical / Trunk Assessment: Normal Cervical / Trunk Exceptions: increased body habitus  Communication   Communication: No difficulties  Cognition Arousal/Alertness: Awake/alert Behavior During Therapy: Restless, Impulsive Overall Cognitive Status: History of cognitive impairments - at baseline Area of Impairment: Attention, Memory, Following commands, Safety/judgement, Problem solving                   Current Attention Level: Selective Memory: Decreased short-term memory Following Commands: Follows one step commands inconsistently, Follows one step commands with increased time Safety/Judgement: Decreased awareness of safety, Decreased awareness of deficits    Problem Solving: Requires verbal cues General Comments: Pt oriented to self, time, and place, however reporting she was admitted from home. Upon arrival pt restless and wanting to get OOB, once seated in chair, pt calmer        General Comments General comments (skin integrity, edema, etc.): VSS on 3L O2 [place], SPO2 in 90's throughout    Exercises     Assessment/Plan    PT Assessment Patient needs continued PT services  PT Problem List Decreased strength;Decreased activity tolerance;Decreased balance;Decreased mobility;Decreased safety awareness       PT Treatment Interventions DME instruction;Gait training;Functional mobility training;Therapeutic activities;Therapeutic exercise;Balance training;Neuromuscular re-education;Patient/family education    PT Goals (Current goals can be found in the Care Plan section)  Acute Rehab PT Goals Patient Stated Goal: get better PT Goal Formulation: With patient Time For Goal Achievement: 05/04/22 Potential to Achieve Goals: Fair    Frequency Min 2X/week     Co-evaluation               AM-PAC PT "6 Clicks" Mobility  Outcome Measure Help needed turning from your back to your side while in a flat bed without using bedrails?: A Lot Help needed moving from lying on your back to sitting on the side of a flat bed without using bedrails?: A Lot Help needed moving to and from a bed to a chair (including a wheelchair)?: A Lot Help needed standing up from a chair using your arms (e.g., wheelchair or bedside chair)?: A Lot Help needed to walk in hospital room?: Total Help needed climbing 3-5 steps with a railing? : Total 6  Click Score: 10    End of Session Equipment Utilized During Treatment: Gait belt;Oxygen Activity Tolerance: Patient limited by fatigue Patient left: in chair;with chair alarm set;with call bell/phone within reach Nurse Communication: Mobility status PT Visit Diagnosis: Unsteadiness on feet (R26.81);Muscle weakness  (generalized) (M62.81)    Time: 8257-4935 PT Time Calculation (min) (ACUTE ONLY): 17 min   Charges:   PT Evaluation $PT Eval Low Complexity: 1 Low          Charlynne Cousins, PT DPT Acute Rehabilitation Services Office [phone]   Luvenia Heller 04/20/2022, 2:45 PM

## 2022-04-21 ENCOUNTER — Telehealth: Payer: Self-pay | Admitting: Internal Medicine

## 2022-04-21 ENCOUNTER — Inpatient Hospital Stay: Payer: Medicare Other | Admitting: Infectious Diseases

## 2022-04-21 DIAGNOSIS — I959 Hypotension, unspecified: Secondary | ICD-10-CM | POA: Diagnosis not present

## 2022-04-21 DIAGNOSIS — Z87898 Personal history of other specified conditions: Secondary | ICD-10-CM | POA: Diagnosis not present

## 2022-04-21 DIAGNOSIS — N179 Acute kidney failure, unspecified: Secondary | ICD-10-CM | POA: Diagnosis not present

## 2022-04-21 DIAGNOSIS — Z8679 Personal history of other diseases of the circulatory system: Secondary | ICD-10-CM | POA: Diagnosis not present

## 2022-04-21 LAB — PHOSPHORUS: Phosphorus: 2.6 mg/dL (ref 2.5–4.6)

## 2022-04-21 LAB — PROTIME-INR
INR: >10 (ref 0.8–1.2)
INR: 1.4 — ABNORMAL HIGH (ref 0.8–1.2)
Prothrombin Time: 81.9 seconds — ABNORMAL HIGH (ref 11.4–15.2)
Prothrombin Time: 17.1 seconds — ABNORMAL HIGH (ref 11.4–15.2)

## 2022-04-21 LAB — COMPREHENSIVE METABOLIC PANEL
ALT: <5 U/L (ref 0–44)
AST: 48 U/L — ABNORMAL HIGH (ref 15–41)
Albumin: 2.3 g/dL — ABNORMAL LOW (ref 3.5–5.0)
Alkaline Phosphatase: 63 U/L (ref 38–126)
Anion gap: 14 (ref 5–15)
BUN: 17 mg/dL (ref 8–23)
CO2: 18 mmol/L — ABNORMAL LOW (ref 22–32)
Calcium: 8.2 mg/dL — ABNORMAL LOW (ref 8.9–10.3)
Chloride: 104 mmol/L (ref 98–111)
Creatinine, Ser: 1.41 mg/dL — ABNORMAL HIGH (ref 0.44–1.00)
GFR, Estimated: 36 mL/min — ABNORMAL LOW (ref >60)
Glucose, Bld: 178 mg/dL — ABNORMAL HIGH (ref 70–99)
Potassium: 4.1 mmol/L (ref 3.5–5.1)
Sodium: 136 mmol/L (ref 135–145)
Total Bilirubin: 0.7 mg/dL (ref 0.3–1.2)
Total Protein: 7.7 g/dL (ref 6.5–8.1)

## 2022-04-21 LAB — CBC
HCT: 26.3 % — ABNORMAL LOW (ref 36.0–46.0)
Hemoglobin: 8.8 g/dL — ABNORMAL LOW (ref 12.0–15.0)
MCH: 31.1 pg (ref 26.0–34.0)
MCHC: 33.5 g/dL (ref 30.0–36.0)
MCV: 92.9 fL (ref 80.0–100.0)
Platelets: 250 10*3/uL (ref 150–400)
RBC: 2.83 MIL/uL — ABNORMAL LOW (ref 3.87–5.11)
RDW: 14.3 % (ref 11.5–15.5)
WBC: 7.7 10*3/uL (ref 4.0–10.5)
nRBC: 0 % (ref 0.0–0.2)

## 2022-04-21 LAB — DIC (DISSEMINATED INTRAVASCULAR COAGULATION)PANEL
D-Dimer, Quant: 0.33 ug/mL-FEU (ref 0.00–0.50)
Fibrinogen: 533 mg/dL — ABNORMAL HIGH (ref 210–475)
INR: >10 (ref 0.8–1.2)
Platelets: 210 10*3/uL (ref 150–400)
Prothrombin Time: 84 seconds — ABNORMAL HIGH (ref 11.4–15.2)
Smear Review: NONE SEEN
aPTT: 118 seconds — ABNORMAL HIGH (ref 24–36)

## 2022-04-21 LAB — GLUCOSE, CAPILLARY
Glucose-Capillary: 188 mg/dL — ABNORMAL HIGH (ref 70–99)
Glucose-Capillary: 175 mg/dL — ABNORMAL HIGH (ref 70–99)
Glucose-Capillary: 154 mg/dL — ABNORMAL HIGH (ref 70–99)
Glucose-Capillary: 155 mg/dL — ABNORMAL HIGH (ref 70–99)

## 2022-04-21 LAB — PROCALCITONIN: Procalcitonin: 0.29 ng/mL

## 2022-04-21 LAB — MAGNESIUM: Magnesium: 1.6 mg/dL — ABNORMAL LOW (ref 1.7–2.4)

## 2022-04-21 MED ORDER — CEFAZOLIN SODIUM-DEXTROSE 2-4 GM/100ML-% IV SOLN
2.0000 g | Freq: Three times a day (TID) | INTRAVENOUS | Status: DC
  Administered 2022-04-21 – 2022-04-24 (×10): 2 g via INTRAVENOUS
  Filled 2022-04-21 (×10): qty 100

## 2022-04-21 MED ORDER — ENOXAPARIN SODIUM 60 MG/0.6ML IJ SOSY
55.0000 mg | PREFILLED_SYRINGE | INTRAMUSCULAR | Status: DC
  Administered 2022-04-21 – 2022-04-23 (×3): 55 mg via SUBCUTANEOUS
  Filled 2022-04-21 (×3): qty 0.6

## 2022-04-21 MED ORDER — MAGIC MOUTHWASH W/LIDOCAINE
5.0000 mL | Freq: Four times a day (QID) | ORAL | Status: DC
  Administered 2022-04-21 – 2022-04-24 (×7): 5 mL via ORAL
  Filled 2022-04-21 (×16): qty 5

## 2022-04-21 MED ORDER — LORAZEPAM 2 MG/ML IJ SOLN
1.0000 mg | Freq: Once | INTRAMUSCULAR | Status: AC
  Administered 2022-04-21: 1 mg via INTRAVENOUS
  Filled 2022-04-21: qty 1

## 2022-04-21 MED ORDER — MAGNESIUM SULFATE 2 GM/50ML IV SOLN
2.0000 g | Freq: Once | INTRAVENOUS | Status: AC
  Administered 2022-04-21: 2 g via INTRAVENOUS
  Filled 2022-04-21: qty 50

## 2022-04-21 NOTE — TOC Initial Note (Signed)
Transition of Care [facility]) - Initial/Assessment Note    Patient Details  Name: Jessica [name] MRN: [id number] Date of Birth: Nov 29, 1933  Transition [facility] Care [facility]) CM/SW Contact:    Benard Halsted, LCSW Phone Number: 04/21/2022, 9:35 AM  Clinical Narrative:                 CSW spoke with patient's niece. She stated she is the best contact for patient though she lives in [place] but she has been the one to keep an eye on patient as patient's daughter has had a leg amputation and has not been able to assist her. She reported patient also has a female friend that lives close by that checks on her. She requested that patient return to [facility] for rehab though she was not able to complete a bed hold. CSW explained insurance process and advised her to assist patient in applying for facility Medicaid in the event patient requires longer than the 10 or so days that patient has left that are fully covered by Medicare. CSW provided Scientist, research (physical sciences) and niece stated she will give info to daughter to help her apply. CSW will stay in touch with [facility] on bed availability.   Expected Discharge Plan: Skilled Nursing Facility Barriers to Discharge: Continued Medical Work up, Ship broker   Patient Goals and CMS Choice Patient states their goals for this hospitalization and ongoing recovery are:: Rehab CMS Medicare.gov Compare Post Acute Care list provided to:: Patient Represenative (must comment) Choice offered to / list presented to :  (Niece) [place] ownership interest in [facility].provided to::  (Niece)    Expected Discharge Plan and Services In-house Referral: Clinical Social Work   Post Acute Care Choice: [place] Living arrangements for the past 2 months: [place]                                      Prior Living Arrangements/Services Living arrangements for the past 2 months: [place] Lives with::  Self Patient language and need for interpreter reviewed:: Yes Do you feel safe going back to the place where you live?: Yes      Need for Family Participation in Patient Care: Yes (Comment) Care giver support system in place?: Yes (comment)   Criminal Activity/Legal Involvement Pertinent to Current Situation/Hospitalization: No - Comment as needed  Activities of Daily Living      Permission Sought/Granted Permission sought to share information with : Facility Sport and exercise psychologist, Family Supports Permission granted to share information with : Yes, Verbal Permission Granted  Share Information with NAME: Niece  Permission granted to share info w AGENCY: [facility]  Permission granted to share info w Relationship: Niece  Permission granted to share info w Contact Information: [phone]  Emotional Assessment Appearance:: Appears stated age Attitude/Demeanor/Rapport: Unable to Assess Affect (typically observed): Unable to Assess Orientation: : Oriented to Self, Oriented to Place Alcohol / Substance Use: Not Applicable Psych Involvement: No (comment)  Admission diagnosis:  Dehydration [E86.0] Skin lesion [L98.9] Acute diarrhea [R19.7] Generalized weakness [R53.1] AKI (acute kidney injury) ([place]) [N17.9] COVID-19 virus infection [U07.1] Patient Active Problem List   Diagnosis Date Noted   ABLA (acute blood loss anemia) 04/20/2022   Elevated INR 04/20/2022   Prolonged QT interval 04/20/2022   Delirium 04/19/2022   History of bacteremia 04/18/2022   COVID-19 virus infection 04/18/2022   COPD with  acute exacerbation ([place]) 04/18/2022   Dementia with behavioral disturbance ([place]) 04/18/2022   MSSA bacteremia 04/04/2022   Prosthetic valve endocarditis ([place]) 04/04/2022   Pacemaker infection ([place]) 04/04/2022   Sepsis ([place]) 03/28/2022   Altered mental status 03/28/2022   Hypovolemia 03/28/2022   AKI (acute kidney injury) ([place]) 03/28/2022   Severe sepsis with acute organ dysfunction  ([place]) 03/28/2022   DNR (do not resuscitate) 03/28/2022   Hypotension 03/28/2022   Rectal bleeding    Pure hypercholesterolemia 01/28/2021   Fall 12/22/2020   Knee contusion 12/22/2020   Eczema 09/09/2019   Vertigo 03/05/2019   S/P TAVR (transcatheter aortic valve replacement) 01/14/2019   Presence of permanent cardiac pacemaker    Pulmonary nodule    Severe aortic stenosis    Atrial fibrillation and flutter ([place]) 07/05/2017   Acute on chronic diastolic CHF (congestive heart failure) (HCC)    DOE (dyspnea on exertion) 04/18/2017   Chronic venous insufficiency 12/08/2016   Erythema nodosum 11/10/2016   Paronychia of finger, left 04/13/2016   Left carotid artery stenosis 09/24/2014   Diarrhea 07/12/2013   Foot pain, left 03/18/2013   Gastrocnemius strain 03/18/2013   GERD (gastroesophageal reflux disease) 08/18/2011   History of pulmonary embolism 10/2008   DUODENITIS, WITHOUT HEMORRHAGE 06/29/2008   Diverticulosis of colon without hemorrhage 06/29/2008   DM type 2, controlled, with complication ([place]) 84/66/5993   Low back pain 10/15/2007   OBESITY, MORBID 07/16/2007   Grieving 05/15/2007   HLD (hyperlipidemia) 03/03/2007   Hypothyroidism 11/06/2006   Anxiety disorder 11/06/2006   CARPAL TUNNEL SYNDROME 11/06/2006   Essential hypertension 11/06/2006   HIATAL HERNIA 11/06/2006   Osteoarthritis 11/06/2006   History of endocarditis 11/06/2006   PCP:  [name], MD Pharmacy:   [facility] [place], [address] [place] [address] Phone: [phone] Fax: [phone]  [place], [address] [place] [address] Phone: [phone] Fax: [place], [place] - [address] [place] [address] Phone: [phone] Fax:  [phone]     Social Determinants of Health (SDOH) Social History: SDOH Screenings   Food Insecurity: No Food Insecurity (03/30/2022)  Housing: Low Risk  (03/30/2022)  Transportation Needs: No Transportation Needs (03/30/2022)  Utilities: Not At Risk (03/30/2022)  Alcohol Screen: Low Risk  (07/01/2021)  Depression (PHQ2-9): Low Risk  (07/01/2021)  Financial Resource Strain: Low Risk  (07/01/2021)  Physical Activity: Inactive (07/01/2021)  Social Connections: Socially Isolated (07/01/2021)  Stress: No Stress Concern Present (07/01/2021)  Tobacco Use: Low Risk  (04/18/2022)   SDOH Interventions:     Readmission Risk Interventions    04/21/2022    9:30 AM  Readmission Risk Prevention Plan  Transportation Screening Complete  Medication Review (RN Care Manager) Complete  PCP or Specialist appointment within 3-5 days of discharge Complete  HRI or Home Care Consult Complete  SW Recovery Care/Counseling Consult Complete  Palliative Care Screening Not [place] Complete

## 2022-04-21 NOTE — Progress Notes (Signed)
Peripherally Inserted Central Catheter Placement  The IV Nurse has discussed with the patient and/or persons authorized to consent for the patient, the purpose of this procedure and the potential benefits and risks involved with this procedure.  The benefits include less needle sticks, lab draws from the catheter, and the patient may be discharged home with the catheter. Risks include, but not limited to, infection, bleeding, blood clot (thrombus formation), and puncture of an artery; nerve damage and irregular heartbeat and possibility to perform a PICC exchange if needed/ordered by physician.  Alternatives to this procedure were also discussed.  Bard Power PICC patient education guide, fact sheet on infection prevention and patient information card has been provided to patient /or left at bedside.    PICC Placement Documentation  PICC Single Lumen 00/93/81 Right Basilic 39 cm 0 cm (Active)  Indication for Insertion or Continuance of Line Prolonged intravenous therapies 04/21/22 0930  Exposed Catheter (cm) 0 cm 04/21/22 0930  Site Assessment Clean, Dry, Intact 04/21/22 0930  Line Status Flushed;Blood return noted;Saline locked 04/21/22 0930  Dressing Type Transparent 04/21/22 0930  Dressing Status Antimicrobial disc in place 04/21/22 0930  Dressing Intervention New dressing 04/21/22 0930  Dressing Change Due 04/28/22 04/21/22 0930       [address] 04/21/2022, 9:31 AM

## 2022-04-21 NOTE — NC FL2 (Signed)
[place] LEVEL OF CARE FORM     IDENTIFICATION  Patient Name: Jessica [name] Birthdate: 1933-05-20 Sex: female Admission Date (Current Location): 04/18/2022  [facility] and [place] Number:  Herbalist and Address:  [facility], [place] [address] [place], [place] [address]      Provider Number: [id number]  Attending Physician Name and Address:  Mercy Riding, MD  Relative Name and Phone Number:  Dutko,Sharon Niece [phone]    Current Level of Care: Hospital Recommended Level of Care: [place] Prior Approval Number:    Date Approved/Denied:   PASRR Number: [id number]  Discharge Plan: SNF    Current Diagnoses: Patient Active Problem List   Diagnosis Date Noted   ABLA (acute blood loss anemia) 04/20/2022   Elevated INR 04/20/2022   Prolonged QT interval 04/20/2022   Delirium 04/19/2022   History of bacteremia 04/18/2022   COVID-19 virus infection 04/18/2022   COPD with acute exacerbation ([place]) 04/18/2022   Dementia with behavioral disturbance ([place]) 04/18/2022   MSSA bacteremia 04/04/2022   Prosthetic valve endocarditis ([place]) 04/04/2022   Pacemaker infection ([place]) 04/04/2022   Sepsis ([place]) 03/28/2022   Altered mental status 03/28/2022   Hypovolemia 03/28/2022   AKI (acute kidney injury) ([place]) 03/28/2022   Severe sepsis with acute organ dysfunction ([place]) 03/28/2022   DNR (do not resuscitate) 03/28/2022   Hypotension 03/28/2022   Rectal bleeding    Pure hypercholesterolemia 01/28/2021   Fall 12/22/2020   Knee contusion 12/22/2020   Eczema 09/09/2019   Vertigo 03/05/2019   S/P TAVR (transcatheter aortic valve replacement) 01/14/2019   Presence of permanent cardiac pacemaker    Pulmonary nodule    Severe aortic stenosis    Atrial fibrillation and flutter ([place]) 07/05/2017   Acute on chronic diastolic CHF (congestive heart failure) (HCC)    DOE (dyspnea on exertion) 04/18/2017   Chronic venous  insufficiency 12/08/2016   Erythema nodosum 11/10/2016   Paronychia of finger, left 04/13/2016   Left carotid artery stenosis 09/24/2014   Diarrhea 07/12/2013   Foot pain, left 03/18/2013   Gastrocnemius strain 03/18/2013   GERD (gastroesophageal reflux disease) 08/18/2011   History of pulmonary embolism 10/2008   DUODENITIS, WITHOUT HEMORRHAGE 06/29/2008   Diverticulosis of colon without hemorrhage 06/29/2008   DM type 2, controlled, with complication ([place]) 44/04/270   Low back pain 10/15/2007   OBESITY, MORBID 07/16/2007   Grieving 05/15/2007   HLD (hyperlipidemia) 03/03/2007   Hypothyroidism 11/06/2006   Anxiety disorder 11/06/2006   CARPAL TUNNEL SYNDROME 11/06/2006   Essential hypertension 11/06/2006   HIATAL HERNIA 11/06/2006   Osteoarthritis 11/06/2006   History of endocarditis 11/06/2006    Orientation RESPIRATION BLADDER Height & Weight     Self, Place, Situation  O2 (4L nasal cannula) Incontinent, External catheter Weight: 246 lb 0.5 oz (111.6 kg) Height:  '5\' 2"'$  (157.5 cm)  BEHAVIORAL SYMPTOMS/MOOD NEUROLOGICAL BOWEL NUTRITION STATUS      Continent Diet (See dc summary)  AMBULATORY STATUS COMMUNICATION OF NEEDS Skin   Extensive Assist Verbally Other (Comment) (skin tear on arm)                       Personal Care Assistance Level of Assistance  Bathing, Feeding, Dressing Bathing Assistance: Maximum assistance Feeding assistance: Limited assistance Dressing Assistance: Maximum assistance     Functional Limitations Info  Sight, Hearing Sight Info: Impaired Hearing Info: Impaired      SPECIAL CARE FACTORS FREQUENCY  PT (By licensed PT), OT (By licensed OT)     PT Frequency: 5x/week OT Frequency: 5x/week            Contractures Contractures Info: Not present    Additional Factors Info  Code Status, Allergies, Psychotropic, Insulin Sliding Scale, Isolation Precautions Code Status Info: DNR Allergies Info: Coreg (Carvedilol), Relafen  (Nabumetone), Calcium, Codeine, Lipitor (Atorvastatin), Pacerone (Amiodarone), Vasotec (Enalapril), Vioxx (Rofecoxib), Zocor (Simvastatin) Psychotropic Info: Effexor Insulin Sliding Scale Info: see dc summary Isolation Precautions Info: COVID+     Current Medications (04/21/2022):  This is the current hospital active medication list Current Facility-Administered Medications  Medication Dose Route Frequency Provider Last Rate Last Admin   acetaminophen (TYLENOL) tablet 650 mg  650 mg Oral Q6H PRN Fuller Plan A, MD   650 mg at 04/20/22 2119   Or   acetaminophen (TYLENOL) suppository 650 mg  650 mg Rectal Q6H PRN Fuller Plan A, MD       albuterol (VENTOLIN HFA) 108 (90 Base) MCG/ACT inhaler 2 puff  2 puff Inhalation BID Wendee Beavers T, MD       allopurinol (ZYLOPRIM) tablet 100 mg  100 mg Oral Daily Smith, Rondell A, MD   100 mg at 04/20/22 0940   ascorbic acid (VITAMIN C) tablet 500 mg  500 mg Oral Daily Smith, Rondell A, MD   500 mg at 04/20/22 0940   ceFAZolin (ANCEF) IVPB 2g/100 mL premix  2 g Intravenous Q8H Rolla Flatten, RPH       Chlorhexidine Gluconate Cloth 2 % PADS 6 each  6 each Topical Daily Wendee Beavers T, MD   6 each at 04/20/22 1108   guaiFENesin-dextromethorphan (ROBITUSSIN DM) 100-10 MG/5ML syrup 10 mL  10 mL Oral Q4H PRN Fuller Plan A, MD   10 mL at 04/20/22 2119   insulin aspart (novoLOG) injection 0-5 Units  0-5 Units Subcutaneous QHS Smith, Rondell A, MD       insulin aspart (novoLOG) injection 0-9 Units  0-9 Units Subcutaneous TID WC Fuller Plan A, MD   2 Units at 04/20/22 1739   levothyroxine (SYNTHROID) tablet 75 mcg  75 mcg Oral Q0600 Fuller Plan A, MD   75 mcg at 04/21/22 0523   LORazepam (ATIVAN) tablet 1 mg  1 mg Oral Q6H PRN Etta Quill, DO   1 mg at 04/21/22 7035   magnesium sulfate IVPB 2 g 50 mL  2 g Intravenous Once Wendee Beavers T, MD       multivitamin with minerals tablet 1 tablet  1 tablet Oral Daily Fuller Plan A, MD   1 tablet at  04/20/22 0940   nystatin (MYCOSTATIN/NYSTOP) topical powder 1 Application  1 Application Topical Daily Fuller Plan A, MD   1 Application at 00/93/81 1108   polyvinyl alcohol (LIQUIFILM TEARS) 1.4 % ophthalmic solution 1 drop  1 drop Both Eyes Q8H PRN Smith, Rondell A, MD       predniSONE (DELTASONE) tablet 20 mg  20 mg Oral Q breakfast Cyndia Skeeters, Taye T, MD   20 mg at 04/20/22 0940   rosuvastatin (CRESTOR) tablet 10 mg  10 mg Oral Q M,W,F Smith, Rondell A, MD   10 mg at 04/20/22 0940   saccharomyces boulardii (FLORASTOR) capsule 250 mg  250 mg Oral BID Fuller Plan A, MD   250 mg at 04/20/22 2119   sodium chloride flush (NS) 0.9 % injection 10-40 mL  10-40 mL Intracatheter Q12H Wendee Beavers T, MD   20 mL at 04/20/22  2126   sodium chloride flush (NS) 0.9 % injection 10-40 mL  10-40 mL Intracatheter PRN Wendee Beavers T, MD       sodium chloride flush (NS) 0.9 % injection 3 mL  3 mL Intravenous Q12H Smith, Rondell A, MD   3 mL at 04/20/22 2127   temazepam (RESTORIL) capsule 7.5 mg  7.5 mg Oral QHS Smith, Rondell A, MD   7.5 mg at 04/20/22 2119   venlafaxine XR (EFFEXOR-XR) 24 hr capsule 75 mg  75 mg Oral Daily Wendee Beavers T, MD   75 mg at 04/20/22 0940   zinc sulfate capsule 220 mg  220 mg Oral Daily Fuller Plan A, MD   220 mg at 04/20/22 0940     Discharge Medications: Please see discharge summary for a list of discharge medications.  Relevant Imaging Results:  Relevant Lab Results:   Additional Information SSN [place], [place]

## 2022-04-21 NOTE — Progress Notes (Signed)
PROGRESS NOTE  Jessica [name] JJO:[id number] DOB: Nov 19, 1933   PCP: [name], MD  Patient is from: SNF DOA: 04/18/2022 LOS: 3  Chief complaints Chief Complaint  Patient presents with   left arm pain     Brief Narrative / Interim history: 89 year old F with PMH of diastolic CHF, A-fib/flutter, AVB s/p PPM, AS s/p TAVR, DM-2, anxiety, depression, hypothyroidism, GERD, cognitive impairment, and recent hospitalization from 12/26-1/7 with septic shock due to MSSA bacteremia and TAVR endocarditis on Ancef and rifampin presenting with diarrhea, poor p.o. intake, nausea and bleeding from left arm lesion, and admitted for AKI, hypotension and  COPD exacerbation in the setting of recent COVID-19 infection. Cr 2.6 (baseline 0.8).  CXR with improved aeration of the lung bases with persistent left > right bibasilar renal opacities.  COVID-19 screen positive.  Received IV fluid bolus and started on maintenance IV and admitted.  AKI improved with IV fluid and continued to improve of IV fluid.  IV fluid discontinued due to respiratory distress. Patient had bleeding from left arm and mouth 1/17-1/18.  INR elevated to 10.5.  Received IV vitamin K 5 mg x 1.  Lovenox, rifampin and molnupiravir discontinued.  INR down to 1.4.  Subjective: Seen and examined earlier this morning.  Hide worsening delirium and pulled out her PICC line last night.  Lateral wrist restraints obtained.  Appears calm this morning.  Plaints other than some pain in her left hip.  Denies chest pain, shortness of breath, nausea or vomiting.  Objective: Vitals:   04/21/22 0700 04/21/22 0800 04/21/22 0819 04/21/22 1237  BP:  (!) 153/125 122/82 135/70  Pulse: (!) 114 (!) 120 (!) 119 (!) 119  Resp: (!) '29  18 20  '$ Temp:   98 F (36.7 C) 98.4 F (36.9 C)  TempSrc:   Oral Oral  SpO2: (!) 86% 94% 100% 100%  Weight:      Height:        Examination:  GENERAL: No apparent distress.  Nontoxic. HEENT: MMM.  Vision and  hearing grossly intact.  NECK: Supple.  No apparent JVD.  RESP:  No IWOB.  Fair aeration bilaterally. CVS:  RRR. Heart sounds normal.  ABD/GI/GU: BS+. Abd soft, NTND.  MSK/EXT:  Moves extremities.  Bilateral wrist restraints. SKIN: no apparent skin lesion or wound NEURO: Awake and alert. Oriented appropriately.  No apparent focal neuro deficit. PSYCH: Calm. Normal affect.   Procedures:  None  Microbiology summarized: 1/16-COVID-19 PCR positive.  She was also positive at facility before admission. 1/16-influenza and RSV PCR nonreactive.  Assessment and plan: Principal Problem:   AKI (acute kidney injury) ([place]) Active Problems:   Hypotension   History of endocarditis   History of bacteremia   COVID-19 virus infection   COPD with acute exacerbation (HCC)   Diarrhea   Acute on chronic diastolic CHF (congestive heart failure) ([place])   DM type 2, controlled, with complication (HCC)   Atrial fibrillation and flutter (HCC)   Anxiety disorder   Hypothyroidism   Dementia with behavioral disturbance (HCC)   Delirium   ABLA (acute blood loss anemia)   Elevated INR   Prolonged QT interval  Acute kidney injury: Suspect prerenal due to poor p.o. intake, diarrhea and concurrent use of diuretics and Entresto.  Improving off IV fluid. Recent Labs    04/02/22 0037 04/03/22 0035 04/05/22 0044 04/06/22 0841 04/08/22 0448 04/09/22 0603 04/18/22 1154 04/19/22 0500 04/20/22 1025 04/21/22 0547  BUN 12 8 7* 7* 8 9 35* 34*  23 17  CREATININE 0.77 0.64 0.70 0.75 0.91 0.82 2.60* 2.46* 1.82* 1.41*  -Oral hydration. -Recheck in the morning  Acute on chronic combined CHF with recovered EF:  Recent TTE with LVEF of 55 to 60% and G2 DD.  Had EF of 30 to 35% in 2019.  CXR with pulmonary edema and BNP elevated likely from IV fluid.  Appears euvolemic on exam. -Continue holding home diuretics and Entresto in the setting of AKI. -Closely monitor respiratory and fluid status while on IV  fluid  ABLA due to left forearm bleeding/oral bleeding due to elevated INR: INR 10.8. Likely from rifampin and molnupiravir.  Bleeding stopped after pressure dressing, IV vitamin K, discontinuing Lovenox, rifampin and molnupiravir.  Hgb improved. Recent Labs    03/31/22 1053 04/02/22 0037 04/03/22 0035 04/05/22 0044 04/08/22 0448 04/18/22 1154 04/19/22 0500 04/20/22 1025 04/20/22 1855 04/21/22 0547  HGB 10.4* 12.9 10.1* 10.0* 9.7* 9.4* 8.2* 7.9* 7.8* 8.8*  -Monitor.   Hypotension: Likely due to dehydration and concurrent use of antihypertensive meds.  Resolved. -Continue holding antihypertensive meds   MSSA bacteremia/TAVR endocarditis: -New ID rec -Continue IV Ancef through 05/11/2022 followed by p.o. cefadroxil indefinitely -Discontinue rifampin.  Okay to resume Eliquis a week later -Outpatient follow-up.    COVID-19 infection: Likely contributing to poor p.o. intake, diarrhea and AKI.  Tested positive at facility.  She had been started on molnupiravir 800 mg p.o. twice daily on 1/12 to be continued until 1/17. -Supportive care with inhalers, vitamin C, zinc, antitussive and mucolytic's. -Airborne precautions   COPD, with acute exacerbation: Likely due to COVID-19 infection.  Seems to have improved.   -Given her delirium, will wean off steroid quickly. -Albuterol inhaler   Diarrhea: Seems to have resolved.  She was unable to provide stool samples for C. difficile   Uncontrolled NIDDM-2 with hyperglycemia: A1c 8.4% (10% on 12/26/2021) Recent Labs  Lab 04/20/22 1216 04/20/22 1614 04/20/22 2134 04/21/22 0819 04/21/22 1238  GLUCAP 178* 194* 192* 188* 175*  -Continue current insulin regimen -Recheck hemoglobin A1c   Paroxsymal atrial fibrillation/flutter Second-degree AV block s/p PPM -Continue holding Lopressor -Hold Lovenox in the setting of bleeding -Can resume home Eliquis on 1/25 now she is off rifampin.   Delirium/cognitive impairment with behavioral  disturbance: agitated and delirious overnight and pulled out her PICC line.  Bilateral wrist restraints obtained. -Discontinue bilateral wrist restraints -Bilateral mittens -Discontinue prednisone -Reorientation and delirium precautions -Fall precautions  Anxiety and depression -Continue venlafaxine, Ativan as needed, and Restoril nightly    Hypothyroidism -Continue levothyroxine  Hypokalemia/hypomagnesemia: -Monitor replenish as appropriate.  Metabolic acidosis: Likely due to renal failure and IV fluid. -Recheck in the morning  Morbid obesity Body mass index is 45 kg/m.           DVT prophylaxis:  SCD for VTE prophylaxis due to bleeding.  Code Status: DNR/DNI Family Communication: None at bedside. Level of care: Med-Surg Status is: Inpatient Remains inpatient appropriate because: AKI and delirium.   Final disposition: SNF Consultants:  None  55 minutes with more than 50% spent in reviewing records, counseling patient/family and coordinating care.   Sch Meds:  Scheduled Meds:  albuterol  2 puff Inhalation BID   allopurinol  100 mg Oral Daily   vitamin C  500 mg Oral Daily   Chlorhexidine Gluconate Cloth  6 each Topical Daily   insulin aspart  0-5 Units Subcutaneous QHS   insulin aspart  0-9 Units Subcutaneous TID WC   levothyroxine  75 mcg Oral  [id number]   multivitamin with minerals  1 tablet Oral Daily   nystatin  1 Application Topical Daily   predniSONE  20 mg Oral Q breakfast   rosuvastatin  10 mg Oral Q M,W,F   saccharomyces boulardii  250 mg Oral BID   sodium chloride flush  10-40 mL Intracatheter Q12H   sodium chloride flush  3 mL Intravenous Q12H   temazepam  7.5 mg Oral QHS   venlafaxine XR  75 mg Oral Daily   zinc sulfate  220 mg Oral Daily   Continuous Infusions:   ceFAZolin (ANCEF) IV 2 g (04/21/22 1104)   magnesium sulfate bolus IVPB 2 g (04/21/22 1321)   PRN Meds:.acetaminophen **OR** acetaminophen, guaiFENesin-dextromethorphan, LORazepam,  polyvinyl alcohol, sodium chloride flush  Antimicrobials: Anti-infectives (From admission, onward)    Start     Dose/Rate Route Frequency Ordered Stop   04/21/22 0815  ceFAZolin (ANCEF) IVPB 2g/100 mL premix        2 g 200 mL/hr over 30 Minutes Intravenous Every 8 hours 04/21/22 0724     04/20/22 1115  molnupiravir EUA (LAGEVRIO) capsule 800 mg  Status:  Discontinued        4 capsule Oral 2 times daily 04/20/22 1018 04/20/22 1255   04/18/22 2200  molnupiravir EUA (LAGEVRIO) capsule 800 mg        4 capsule Oral 2 times daily 04/18/22 1717 04/20/22 2159   04/18/22 2200  ceFAZolin (ANCEF) IVPB 2g/100 mL premix  Status:  Discontinued        2 g 200 mL/hr over 30 Minutes Intravenous Every 12 hours 04/18/22 1915 04/21/22 0724   04/18/22 1715  ceFAZolin (ANCEF) IVPB  Status:  Discontinued       Note to Pharmacy: Indication:  MSSA PVE First Dose: Yes Last Day of Therapy:  05/11/22 Labs - Once weekly:  CBC/D and CMP, Labs - Every other week:  ESR and CRP Method of administration: IV Push Pull PICC line at the completion of IV antibiotics Method of administration may be changed at the discretion of home i   2 g Intravenous Every 8 hours 04/18/22 1703 04/18/22 1915   04/18/22 1715  rifampin (RIFADIN) capsule 300 mg  Status:  Discontinued       Note to Pharmacy: Stop date 05/11/22     300 mg Oral 3 times daily 04/18/22 1703 04/20/22 1238        I have personally reviewed the following labs and images: CBC: Recent Labs  Lab 04/18/22 1154 04/19/22 0500 04/20/22 1025 04/20/22 1300 04/20/22 1855 04/21/22 0547  WBC 4.3 3.7* 4.5  --   --  7.7  NEUTROABS 1.8  --   --   --   --   --   HGB 9.4* 8.2* 7.9*  --  7.8* 8.8*  HCT 29.2* 25.0* 23.8*  --  23.7* 26.3*  MCV 95.4 93.3 93.0  --   --  92.9  PLT 219 200 208 210  --  250   BMP &GFR Recent Labs  Lab 04/18/22 1154 04/19/22 0500 04/20/22 1025 04/21/22 0547  NA 132* 134* 136 136  K 3.6 3.4* 3.3* 4.1  CL 97* 101 103 104  CO2 24 19*  18* 18*  GLUCOSE 133* 131* 170* 178*  BUN 35* 34* 23 17  CREATININE 2.60* 2.46* 1.82* 1.41*  CALCIUM 8.3* 7.7* 7.7* 8.2*  MG  --   --  1.7 1.6*  PHOS  --   --  1.9* 2.6   Estimated  Creatinine Clearance: 33.8 mL/min (A) (by C-G formula based on SCr of 1.41 mg/dL (H)). Liver & Pancreas: Recent Labs  Lab 04/20/22 1025 04/21/22 0547  AST 42* 48*  ALT <5 <5  ALKPHOS 59 63  BILITOT 0.8 0.7  PROT 7.0 7.7  ALBUMIN 2.1* 2.3*   No results for input(s): "LIPASE", "AMYLASE" in the last 168 hours. No results for input(s): "AMMONIA" in the last 168 hours. Diabetic: Recent Labs    04/19/22 0500  HGBA1C 8.4*   Recent Labs  Lab 04/20/22 1216 04/20/22 1614 04/20/22 2134 04/21/22 0819 04/21/22 1238  GLUCAP 178* 194* 192* 188* 175*   Cardiac Enzymes: Recent Labs  Lab 04/20/22 1025  CKTOTAL 104   No results for input(s): "PROBNP" in the last 8760 hours. Coagulation Profile: Recent Labs  Lab 04/20/22 1025 04/20/22 1300 04/20/22 1855 04/21/22 0547  INR 10.5* 10.8* 1.9* 1.4*   Thyroid Function Tests: No results for input(s): "TSH", "T4TOTAL", "FREET4", "T3FREE", "THYROIDAB" in the last 72 hours. Lipid Profile: No results for input(s): "CHOL", "HDL", "LDLCALC", "TRIG", "CHOLHDL", "LDLDIRECT" in the last 72 hours. Anemia Panel: Recent Labs    04/20/22 1025  VITAMINB12 1,471*  FOLATE 20.1  FERRITIN 232  TIBC 223*  IRON 37  RETICCTPCT 1.5   Urine analysis:    Component Value Date/Time   COLORURINE AMBER (A) 04/18/2022 2244   APPEARANCEUR HAZY (A) 04/18/2022 2244   LABSPEC 1.011 04/18/2022 2244   PHURINE 5.0 04/18/2022 2244   GLUCOSEU NEGATIVE 04/18/2022 2244   GLUCOSEU NEGATIVE 09/07/2020 1244   HGBUR MODERATE (A) 04/18/2022 2244   BILIRUBINUR NEGATIVE 04/18/2022 2244   KETONESUR NEGATIVE 04/18/2022 2244   PROTEINUR 30 (A) 04/18/2022 2244   UROBILINOGEN 0.2 09/07/2020 1244   NITRITE NEGATIVE 04/18/2022 2244   LEUKOCYTESUR MODERATE (A) 04/18/2022 2244   Sepsis  Labs: Invalid input(s): "PROCALCITONIN", "LACTICIDVEN"  Microbiology: Recent Results (from the past 240 hour(s))  Resp panel by RT-PCR (RSV, Flu A&B, Covid) Anterior Nasal Swab     Status: Abnormal   Collection Time: 04/18/22  2:05 PM   Specimen: Anterior Nasal Swab  Result Value Ref Range Status   SARS Coronavirus 2 by RT PCR POSITIVE (A) NEGATIVE Final    Comment: (NOTE) SARS-CoV-2 target nucleic acids are DETECTED.  The SARS-CoV-2 RNA is generally detectable in upper respiratory specimens during the acute phase of infection. Positive results are indicative of the presence of the identified virus, but do not rule out bacterial infection or co-infection with other pathogens not detected by the test. Clinical correlation with patient history and other diagnostic information is necessary to determine patient infection status. The expected result is Negative.  Fact Sheet for Patients: EntrepreneurPulse.com.au  Fact Sheet for Healthcare Providers: IncredibleEmployment.be  This test is not yet approved or cleared by the [place] FDA and  has been authorized for detection and/or diagnosis of SARS-CoV-2 by FDA under an Emergency Use Authorization (EUA).  This EUA will remain in effect (meaning this test can be used) for the duration of  the COVID-19 declaration under Section 564(b)(1) of the A ct, 21 U.S.C. section 360bbb-3(b)(1), unless the authorization is terminated or revoked sooner.     Influenza A by PCR NEGATIVE NEGATIVE Final   Influenza B by PCR NEGATIVE NEGATIVE Final    Comment: (NOTE) The Xpert Xpress SARS-CoV-2/FLU/RSV plus assay is intended as an aid in the diagnosis of influenza from Nasopharyngeal swab specimens and should not be used as a sole basis for treatment. Nasal washings and aspirates are  unacceptable for Xpert Xpress SARS-CoV-2/FLU/RSV testing.  Fact Sheet for  Patients: EntrepreneurPulse.com.au  Fact Sheet for Healthcare Providers: IncredibleEmployment.be  This test is not yet approved or cleared by the [place] FDA and has been authorized for detection and/or diagnosis of SARS-CoV-2 by FDA under an Emergency Use Authorization (EUA). This EUA will remain in effect (meaning this test can be used) for the duration of the COVID-19 declaration under Section 564(b)(1) of the Act, 21 U.S.C. section 360bbb-3(b)(1), unless the authorization is terminated or revoked.     Resp Syncytial Virus by PCR NEGATIVE NEGATIVE Final    Comment: (NOTE) Fact Sheet for Patients: EntrepreneurPulse.com.au  Fact Sheet for Healthcare Providers: IncredibleEmployment.be  This test is not yet approved or cleared by the [place] FDA and has been authorized for detection and/or diagnosis of SARS-CoV-2 by FDA under an Emergency Use Authorization (EUA). This EUA will remain in effect (meaning this test can be used) for the duration of the COVID-19 declaration under Section 564(b)(1) of the Act, 21 U.S.C. section 360bbb-3(b)(1), unless the authorization is terminated or revoked.  Performed at [facility], [place] [address] [place], [place] [address]     Radiology Studies: [place] EKG SITE RITE  Result Date: 04/20/2022 If Site Rite image not attached, placement could not be confirmed due to current cardiac rhythm.     Lankford Gutzmer T. [place]  If 7PM-7AM, please contact night-coverage [email or web address] 04/21/2022, 1:28 PM

## 2022-04-21 NOTE — Telephone Encounter (Signed)
Patients neice called and said patient is in the hospital so they had to cancel appt for Monday 04/24/22. They will call back to reschedule

## 2022-04-21 NOTE — Progress Notes (Signed)
PHARMACY NOTE:  ANTIMICROBIAL RENAL DOSAGE ADJUSTMENT  Current antimicrobial regimen includes a mismatch between antimicrobial dosage and estimated renal function.  As per policy approved by the Pharmacy & Therapeutics and Medical Executive Committees, the antimicrobial dosage will be adjusted accordingly.  Current antimicrobial dosage:  Cefazolin 2g IV every 12 hours  Indication: MSSA MV IE  Renal Function:  Estimated Creatinine Clearance: 33.8 mL/min (A) (by C-G formula based on SCr of 1.41 mg/dL (H)). '[]'$      On intermittent HD, scheduled: '[]'$      On CRRT    Antimicrobial dosage has been changed to:  Cefazolin 2g IV every 8 hours  Additional comments:   Thank you for allowing pharmacy to be a part of this patient's care.  Alycia Rossetti, PharmD, BCPS Infectious Diseases Clinical Pharmacist 04/21/2022 7:25 AM   **Pharmacist phone directory can now be found on [email or web address] ([email or web address]).  Listed under [place].

## 2022-04-21 NOTE — Telephone Encounter (Signed)
Noted! Thank you

## 2022-04-21 NOTE — Consult Note (Signed)
[place] Nurse Consult Note: Reason for Consult: L arm wound  Wound type: none WOC consulted for left arm wound that would not stop bleeding.  This RN removed a dressing from left forearm that was saturated in dried blood.  Upon removal of dressing, there appears to be a puncture wound from prior blood draw or attempted IV placement but no wound that requires dressing.  Site no longer bleeding.  I did inspect bilateral arms and other than some bruising no further defects noted.   Discussed above finding with bedside nurse.  WOC will not follow at this time. If further needs arise please re-consult.   Thank you,    Coleton Woon MSN, RN-BC, [employer]

## 2022-04-21 NOTE — TOC Progression Note (Signed)
Transition of Care [facility]) - Progression Note    Patient Details  Name: Jessica [name] MRN: [id number] Date of Birth: 26-Dec-1933  Transition of Care [facility]) CM/SW [place], LCSW Phone Number: 04/21/2022, 1:47 PM  Clinical Narrative:    Per MD, patient likely ready for discharge tomorrow. CSW initiated insurance authorization, Ref# [id number], and made Whitney with [facility] aware.    Expected Discharge Plan: [place] Barriers to Discharge: Continued Medical Work up, Ship broker  Expected Discharge Plan and Services In-house Referral: Clinical Social Work   Post Acute Care Choice: [place] Living arrangements for the past 2 months: [place]                                       Social Determinants of Health (SDOH) Interventions [place]: No Food Insecurity (03/30/2022)  Housing: Low Risk  (03/30/2022)  Transportation Needs: No Transportation Needs (03/30/2022)  Utilities: Not At Risk (03/30/2022)  Alcohol Screen: Low Risk  (07/01/2021)  Depression (PHQ2-9): Low Risk  (07/01/2021)  Financial Resource Strain: Low Risk  (07/01/2021)  Physical Activity: Inactive (07/01/2021)  Social Connections: Socially Isolated (07/01/2021)  Stress: No Stress Concern Present (07/01/2021)  Tobacco Use: Low Risk  (04/18/2022)    Readmission Risk Interventions    04/21/2022    9:30 AM  Readmission Risk Prevention Plan  Transportation Screening Complete  Medication Review (RN Care Manager) Complete  PCP or Specialist appointment within 3-5 days of discharge Complete  HRI or [place] Complete  SW Recovery Care/Counseling Consult Complete  Palliative Care Screening Not Applicable  Skilled Nursing Facility Complete

## 2022-04-22 DIAGNOSIS — Z8679 Personal history of other diseases of the circulatory system: Secondary | ICD-10-CM | POA: Diagnosis not present

## 2022-04-22 DIAGNOSIS — I959 Hypotension, unspecified: Secondary | ICD-10-CM | POA: Diagnosis not present

## 2022-04-22 DIAGNOSIS — N179 Acute kidney failure, unspecified: Secondary | ICD-10-CM | POA: Diagnosis not present

## 2022-04-22 DIAGNOSIS — Z87898 Personal history of other specified conditions: Secondary | ICD-10-CM | POA: Diagnosis not present

## 2022-04-22 LAB — CBC
HCT: 22.5 % — ABNORMAL LOW (ref 36.0–46.0)
Hemoglobin: 7.4 g/dL — ABNORMAL LOW (ref 12.0–15.0)
MCH: 30.7 pg (ref 26.0–34.0)
MCHC: 32.9 g/dL (ref 30.0–36.0)
MCV: 93.4 fL (ref 80.0–100.0)
Platelets: 244 10*3/uL (ref 150–400)
RBC: 2.41 MIL/uL — ABNORMAL LOW (ref 3.87–5.11)
RDW: 14.4 % (ref 11.5–15.5)
WBC: 6.7 10*3/uL (ref 4.0–10.5)
nRBC: 0 % (ref 0.0–0.2)

## 2022-04-22 LAB — RENAL FUNCTION PANEL
Albumin: 1.9 g/dL — ABNORMAL LOW (ref 3.5–5.0)
Anion gap: 7 (ref 5–15)
BUN: 12 mg/dL (ref 8–23)
CO2: 24 mmol/L (ref 22–32)
Calcium: 7.9 mg/dL — ABNORMAL LOW (ref 8.9–10.3)
Chloride: 106 mmol/L (ref 98–111)
Creatinine, Ser: 1.09 mg/dL — ABNORMAL HIGH (ref 0.44–1.00)
GFR, Estimated: 49 mL/min — ABNORMAL LOW (ref >60)
Glucose, Bld: 135 mg/dL — ABNORMAL HIGH (ref 70–99)
Phosphorus: 2.7 mg/dL (ref 2.5–4.6)
Potassium: 3.3 mmol/L — ABNORMAL LOW (ref 3.5–5.1)
Sodium: 137 mmol/L (ref 135–145)

## 2022-04-22 LAB — GLUCOSE, CAPILLARY
Glucose-Capillary: 144 mg/dL — ABNORMAL HIGH (ref 70–99)
Glucose-Capillary: 160 mg/dL — ABNORMAL HIGH (ref 70–99)
Glucose-Capillary: 140 mg/dL — ABNORMAL HIGH (ref 70–99)

## 2022-04-22 LAB — PROCALCITONIN: Procalcitonin: 0.21 ng/mL

## 2022-04-22 LAB — MAGNESIUM: Magnesium: 1.7 mg/dL (ref 1.7–2.4)

## 2022-04-22 MED ORDER — POTASSIUM CHLORIDE 20 MEQ PO PACK
40.0000 meq | PACK | ORAL | Status: AC
  Administered 2022-04-22 (×2): 40 meq via ORAL
  Filled 2022-04-22 (×2): qty 2

## 2022-04-22 MED ORDER — MAGNESIUM SULFATE 2 GM/50ML IV SOLN
2.0000 g | Freq: Once | INTRAVENOUS | Status: AC
  Administered 2022-04-22: 2 g via INTRAVENOUS
  Filled 2022-04-22: qty 50

## 2022-04-22 MED ORDER — ACETAMINOPHEN 500 MG PO TABS
1000.0000 mg | ORAL_TABLET | Freq: Three times a day (TID) | ORAL | Status: DC
  Administered 2022-04-22 – 2022-04-24 (×6): 1000 mg via ORAL
  Filled 2022-04-22 (×6): qty 2

## 2022-04-22 MED ORDER — OXYCODONE HCL 5 MG PO TABS
5.0000 mg | ORAL_TABLET | Freq: Four times a day (QID) | ORAL | Status: DC | PRN
  Administered 2022-04-22 – 2022-04-23 (×3): 5 mg via ORAL
  Filled 2022-04-22 (×5): qty 1

## 2022-04-22 NOTE — Progress Notes (Signed)
Patient seems to be restless despite giving her Ativan twice. She keeps on pulling off everything she can reach. She is also roaming around her bed which makes her at risk for fall. Sheran Luz, DO made aware and advised to put her back on wrist restraints. Bilateral soft restraints on both wrist placed, securely fastened. Radial pulse checked bilaterally +2, capillary refill of less than 3 seconds. Decrease stimulation and music therapy implemented. Will continue to monitor the patient.

## 2022-04-22 NOTE — Progress Notes (Signed)
PROGRESS NOTE  ESLI CLEMENTS LGX:[id number] DOB: 1934/02/28   PCP: [name], MD  Patient is from: SNF DOA: 04/18/2022 LOS: 4  Chief complaints Chief Complaint  Patient presents with   left arm pain     Brief Narrative / Interim history: 89 year old F with PMH of diastolic CHF, A-fib/flutter, AVB s/p PPM, AS s/p TAVR, DM-2, anxiety, depression, hypothyroidism, GERD, cognitive impairment, and recent hospitalization from 12/26-1/7 with septic shock due to MSSA bacteremia and TAVR endocarditis on Ancef and rifampin presenting with diarrhea, poor p.o. intake, nausea and bleeding from left arm lesion, and admitted for AKI, hypotension and  COPD exacerbation in the setting of recent COVID-19 infection. Cr 2.6 (baseline 0.8).  CXR with improved aeration of the lung bases with persistent left > right bibasilar renal opacities.  COVID-19 screen positive.  Received IV fluid bolus and started on maintenance IV and admitted.  AKI improved with IV fluid and continued to improve of IV fluid.  IV fluid discontinued due to respiratory distress. Patient had bleeding from left arm and mouth 1/17-1/18.  INR elevated to 10.5.  Received IV vitamin K 5 mg x 1.  Lovenox, rifampin and molnupiravir discontinued.  INR down to 1.4.  Subjective: Seen and examined earlier this morning.  Patient does restless and agitated overnight that did not improve with IV Ativan.  She required bilateral soft restraints on both wrists.  She is sleepy this morning.  She is off restraints.  Briefly wakes to voice and falls back to sleep.   Objective: Vitals:   04/21/22 2303 04/22/22 0508 04/22/22 0804 04/22/22 0810  BP: (!) 145/77 (!) 142/78    Pulse: 99 100 89   Resp: 18 20    Temp: 97.6 F (36.4 C) 98.8 F (37.1 C)    TempSrc: Axillary Axillary    SpO2: 97% 100%  96%  Weight:      Height:        Examination:  GENERAL: No apparent distress.  Nontoxic. HEENT: MMM.  Vision and hearing grossly intact.   NECK: Supple.  No apparent JVD.  RESP:  No IWOB.  Fair aeration bilaterally. CVS:  RRR. Heart sounds normal.  ABD/GI/GU: BS+. Abd soft, NTND.  MSK/EXT:  Moves extremities.  Bilateral mittens.  Dressing over left forearm. SKIN: no apparent skin lesion or wound NEURO: Sleepy but wakes to voice.  Does not follow command.  No apparent focal neuro deficit. PSYCH: Calm. Normal affect.   Procedures:  None  Microbiology summarized: 1/16-COVID-19 PCR positive.  She was also positive at facility before admission. 1/16-influenza and RSV PCR nonreactive.  Assessment and plan: Principal Problem:   AKI (acute kidney injury) ([place]) Active Problems:   Hypotension   History of endocarditis   History of bacteremia   COVID-19 virus infection   COPD with acute exacerbation (HCC)   Diarrhea   Acute on chronic diastolic CHF (congestive heart failure) ([place])   DM type 2, controlled, with complication (HCC)   Atrial fibrillation and flutter (HCC)   Anxiety disorder   Hypothyroidism   Dementia with behavioral disturbance (HCC)   Delirium   ABLA (acute blood loss anemia)   Elevated INR   Prolonged QT interval  Acute kidney injury: prerenal due to poor p.o. intake, diarrhea and concurrent use of diuretics and Entresto.  Resolving.  Off IVF. Recent Labs    04/03/22 0035 04/05/22 0044 04/06/22 0841 04/08/22 0448 04/09/22 0603 04/18/22 1154 04/19/22 0500 04/20/22 1025 04/21/22 0547 04/22/22 1113  BUN 8 7*  7* 8 9 35* 34* '23 17 12  '$ CREATININE 0.64 0.70 0.75 0.91 0.82 2.60* 2.46* 1.82* 1.41* 1.09*  -Oral hydration. -Recheck in the morning  Acute on chronic combined CHF with recovered EF:  Recent TTE with LVEF of 55 to 60% and G2 DD.  Had EF of 30 to 35% in 2019.  CXR with pulmonary edema and BNP elevated likely from IV fluid.  Appears euvolemic on exam. -Continue holding home diuretics and Entresto in the setting of AKI. -Closely monitor respiratory and fluid status while on IV fluid  ABLA  due to left forearm bleeding/oral bleeding due to elevated INR: INR 10.8. Likely from rifampin and molnupiravir.  Bleeding stopped after pressure dressing, IV vitamin K, discontinuing Lovenox, rifampin and molnupiravir.  Recent Labs    04/02/22 0037 04/03/22 0035 04/05/22 0044 04/08/22 0448 04/18/22 1154 04/19/22 0500 04/20/22 1025 04/20/22 1855 04/21/22 0547 04/22/22 0429  HGB 12.9 10.1* 10.0* 9.7* 9.4* 8.2* 7.9* 7.8* 8.8* 7.4*  -Monitor.   Hypotension: Likely due to dehydration and concurrent use of antihypertensive meds.  Resolved. -Continue holding antihypertensive meds   MSSA bacteremia/TAVR endocarditis: -New ID rec -Continue IV Ancef through 05/11/2022 followed by p.o. cefadroxil indefinitely -Discontinue rifampin.  Okay to resume Eliquis a week later -Outpatient follow-up.    COVID-19 infection: Likely contributing to poor p.o. intake, diarrhea and AKI.  Tested positive at facility.  She had been started on molnupiravir 800 mg p.o. twice daily on 1/12 to be continued until 1/17. -Supportive care with inhalers, vitamin C, zinc, antitussive and mucolytic's. -Airborne precautions   COPD, with acute exacerbation: Likely due to COVID-19 infection.  Seems to have improved.   -Given her delirium, will wean off steroid quickly. -Albuterol inhaler   Diarrhea: Seems to have resolved.  She was unable to provide stool samples for C. difficile   Uncontrolled NIDDM-2 with hyperglycemia: A1c 8.4% (10% on 12/26/2021) Recent Labs  Lab 04/21/22 0819 04/21/22 1238 04/21/22 1809 04/21/22 2145 04/22/22 0850  GLUCAP 188* 175* 154* 155* 144*  -Continue current insulin regimen  Paroxsymal atrial fibrillation/flutter Second-degree AV block s/p PPM -Continue holding Lopressor -Hold Lovenox in the setting of bleeding -Can resume home Eliquis on 1/25 now she is off rifampin.   Delirium/cognitive impairment with behavioral disturbance: Agitated and restless overnight and required  bilateral wrist restraints and Ativan.  She is quite somnolent this morning.  Restraints off. -Bilateral mittens -Reorientation and delirium precautions -Fall precautions  Anxiety and depression -Continue venlafaxine, Ativan as needed, and Restoril nightly    Hypothyroidism -Continue levothyroxine  Hypokalemia/hypomagnesemia: -Monitor replenish as appropriate.  Metabolic acidosis: Likely due to renal failure and IV fluid. -Recheck in the morning  Morbid obesity Body mass index is 45 kg/m.           DVT prophylaxis:  SCD for VTE prophylaxis due to bleeding.  Code Status: DNR/DNI Family Communication: None at bedside. Level of care: Med-Surg Status is: Inpatient Remains inpatient appropriate because: Delirium   Final disposition: SNF Consultants:  None  55 minutes with more than 50% spent in reviewing records, counseling patient/family and coordinating care.   Sch Meds:  Scheduled Meds:  albuterol  2 puff Inhalation BID   allopurinol  100 mg Oral Daily   vitamin C  500 mg Oral Daily   Chlorhexidine Gluconate Cloth  6 each Topical Daily   enoxaparin (LOVENOX) injection  55 mg Subcutaneous Q24H   insulin aspart  0-5 Units Subcutaneous QHS   insulin aspart  0-9 Units Subcutaneous TID  WC   levothyroxine  75 mcg Oral Q0600   magic mouthwash w/lidocaine  5 mL Oral QID   multivitamin with minerals  1 tablet Oral Daily   nystatin  1 Application Topical Daily   rosuvastatin  10 mg Oral Q M,W,F   saccharomyces boulardii  250 mg Oral BID   sodium chloride flush  10-40 mL Intracatheter Q12H   sodium chloride flush  3 mL Intravenous Q12H   temazepam  7.5 mg Oral QHS   venlafaxine XR  75 mg Oral Daily   zinc sulfate  220 mg Oral Daily   Continuous Infusions:   ceFAZolin (ANCEF) IV 2 g (04/22/22 0500)   PRN Meds:.acetaminophen **OR** acetaminophen, guaiFENesin-dextromethorphan, LORazepam, polyvinyl alcohol, sodium chloride flush  Antimicrobials: Anti-infectives  (From admission, onward)    Start     Dose/Rate Route Frequency Ordered Stop   04/21/22 0815  ceFAZolin (ANCEF) IVPB 2g/100 mL premix        2 g 200 mL/hr over 30 Minutes Intravenous Every 8 hours 04/21/22 0724     04/20/22 1115  molnupiravir EUA (LAGEVRIO) capsule 800 mg  Status:  Discontinued        4 capsule Oral 2 times daily 04/20/22 1018 04/20/22 1255   04/18/22 2200  molnupiravir EUA (LAGEVRIO) capsule 800 mg        4 capsule Oral 2 times daily 04/18/22 1717 04/20/22 2159   04/18/22 2200  ceFAZolin (ANCEF) IVPB 2g/100 mL premix  Status:  Discontinued        2 g 200 mL/hr over 30 Minutes Intravenous Every 12 hours 04/18/22 1915 04/21/22 0724   04/18/22 1715  ceFAZolin (ANCEF) IVPB  Status:  Discontinued       Note to Pharmacy: Indication:  MSSA PVE First Dose: Yes Last Day of Therapy:  05/11/22 Labs - Once weekly:  CBC/D and CMP, Labs - Every other week:  ESR and CRP Method of administration: IV Push Pull PICC line at the completion of IV antibiotics Method of administration may be changed at the discretion of home i   2 g Intravenous Every 8 hours 04/18/22 1703 04/18/22 1915   04/18/22 1715  rifampin (RIFADIN) capsule 300 mg  Status:  Discontinued       Note to Pharmacy: Stop date 05/11/22     300 mg Oral 3 times daily 04/18/22 1703 04/20/22 1238        I have personally reviewed the following labs and images: CBC: Recent Labs  Lab 04/18/22 1154 04/19/22 0500 04/20/22 1025 04/20/22 1300 04/20/22 1855 04/21/22 0547 04/22/22 0429  WBC 4.3 3.7* 4.5  --   --  7.7 6.7  NEUTROABS 1.8  --   --   --   --   --   --   HGB 9.4* 8.2* 7.9*  --  7.8* 8.8* 7.4*  HCT 29.2* 25.0* 23.8*  --  23.7* 26.3* 22.5*  MCV 95.4 93.3 93.0  --   --  92.9 93.4  PLT 219 200 208 210  --  250 244   BMP &GFR Recent Labs  Lab 04/18/22 1154 04/19/22 0500 04/20/22 1025 04/21/22 0547 04/22/22 1113  NA 132* 134* 136 136 137  K 3.6 3.4* 3.3* 4.1 3.3*  CL 97* 101 103 104 106  CO2 24 19* 18* 18*  24  GLUCOSE 133* 131* 170* 178* 135*  BUN 35* 34* '23 17 12  '$ CREATININE 2.60* 2.46* 1.82* 1.41* 1.09*  CALCIUM 8.3* 7.7* 7.7* 8.2* 7.9*  MG  --   --  1.7 1.6* 1.7  PHOS  --   --  1.9* 2.6 2.7   Estimated Creatinine Clearance: 43.7 mL/min (A) (by C-G formula based on SCr of 1.09 mg/dL (H)). Liver & Pancreas: Recent Labs  Lab 04/20/22 1025 04/21/22 0547 04/22/22 1113  AST 42* 48*  --   ALT <5 <5  --   ALKPHOS 59 63  --   BILITOT 0.8 0.7  --   PROT 7.0 7.7  --   ALBUMIN 2.1* 2.3* 1.9*   No results for input(s): "LIPASE", "AMYLASE" in the last 168 hours. No results for input(s): "AMMONIA" in the last 168 hours. Diabetic: No results for input(s): "HGBA1C" in the last 72 hours.  Recent Labs  Lab 04/21/22 0819 04/21/22 1238 04/21/22 1809 04/21/22 2145 04/22/22 0850  GLUCAP 188* 175* 154* 155* 144*   Cardiac Enzymes: Recent Labs  Lab 04/20/22 1025  CKTOTAL 104   No results for input(s): "PROBNP" in the last 8760 hours. Coagulation Profile: Recent Labs  Lab 04/20/22 1025 04/20/22 1300 04/20/22 1855 04/21/22 0547  INR >10.0* >10.0* 1.9* 1.4*   Thyroid Function Tests: No results for input(s): "TSH", "T4TOTAL", "FREET4", "T3FREE", "THYROIDAB" in the last 72 hours. Lipid Profile: No results for input(s): "CHOL", "HDL", "LDLCALC", "TRIG", "CHOLHDL", "LDLDIRECT" in the last 72 hours. Anemia Panel: Recent Labs    04/20/22 1025  VITAMINB12 1,471*  FOLATE 20.1  FERRITIN 232  TIBC 223*  IRON 37  RETICCTPCT 1.5   Urine analysis:    Component Value Date/Time   COLORURINE AMBER (A) 04/18/2022 2244   APPEARANCEUR HAZY (A) 04/18/2022 2244   LABSPEC 1.011 04/18/2022 2244   PHURINE 5.0 04/18/2022 2244   GLUCOSEU NEGATIVE 04/18/2022 2244   GLUCOSEU NEGATIVE 09/07/2020 1244   HGBUR MODERATE (A) 04/18/2022 2244   BILIRUBINUR NEGATIVE 04/18/2022 2244   KETONESUR NEGATIVE 04/18/2022 2244   PROTEINUR 30 (A) 04/18/2022 2244   UROBILINOGEN 0.2 09/07/2020 1244   NITRITE  NEGATIVE 04/18/2022 2244   LEUKOCYTESUR MODERATE (A) 04/18/2022 2244   Sepsis Labs: Invalid input(s): "PROCALCITONIN", "LACTICIDVEN"  Microbiology: Recent Results (from the past 240 hour(s))  Resp panel by RT-PCR (RSV, Flu A&B, Covid) Anterior Nasal Swab     Status: Abnormal   Collection Time: 04/18/22  2:05 PM   Specimen: Anterior Nasal Swab  Result Value Ref Range Status   SARS Coronavirus 2 by RT PCR POSITIVE (A) NEGATIVE Final    Comment: (NOTE) SARS-CoV-2 target nucleic acids are DETECTED.  The SARS-CoV-2 RNA is generally detectable in upper respiratory specimens during the acute phase of infection. Positive results are indicative of the presence of the identified virus, but do not rule out bacterial infection or co-infection with other pathogens not detected by the test. Clinical correlation with patient history and other diagnostic information is necessary to determine patient infection status. The expected result is Negative.  Fact Sheet for Patients: EntrepreneurPulse.com.au  Fact Sheet for Healthcare Providers: IncredibleEmployment.be  This test is not yet approved or cleared by the [place] FDA and  has been authorized for detection and/or diagnosis of SARS-CoV-2 by FDA under an Emergency Use Authorization (EUA).  This EUA will remain in effect (meaning this test can be used) for the duration of  the COVID-19 declaration under Section 564(b)(1) of the A ct, 21 U.S.C. section 360bbb-3(b)(1), unless the authorization is terminated or revoked sooner.     Influenza A by PCR NEGATIVE NEGATIVE Final   Influenza B by PCR NEGATIVE NEGATIVE Final    Comment: (NOTE) The Xpert  Xpress SARS-CoV-2/FLU/RSV plus assay is intended as an aid in the diagnosis of influenza from Nasopharyngeal swab specimens and should not be used as a sole basis for treatment. Nasal washings and aspirates are unacceptable for Xpert Xpress  SARS-CoV-2/FLU/RSV testing.  Fact Sheet for Patients: EntrepreneurPulse.com.au  Fact Sheet for Healthcare Providers: IncredibleEmployment.be  This test is not yet approved or cleared by the [place] FDA and has been authorized for detection and/or diagnosis of SARS-CoV-2 by FDA under an Emergency Use Authorization (EUA). This EUA will remain in effect (meaning this test can be used) for the duration of the COVID-19 declaration under Section 564(b)(1) of the Act, 21 U.S.C. section 360bbb-3(b)(1), unless the authorization is terminated or revoked.     Resp Syncytial Virus by PCR NEGATIVE NEGATIVE Final    Comment: (NOTE) Fact Sheet for Patients: EntrepreneurPulse.com.au  Fact Sheet for Healthcare Providers: IncredibleEmployment.be  This test is not yet approved or cleared by the [place] FDA and has been authorized for detection and/or diagnosis of SARS-CoV-2 by FDA under an Emergency Use Authorization (EUA). This EUA will remain in effect (meaning this test can be used) for the duration of the COVID-19 declaration under Section 564(b)(1) of the Act, 21 U.S.C. section 360bbb-3(b)(1), unless the authorization is terminated or revoked.  Performed at [facility], [place] [address] [place], [place] [address]     Radiology Studies: No results found.    Trevonn Hallum T. [place]  If 7PM-7AM, please contact night-coverage [email or web address] 04/22/2022, 12:48 PM

## 2022-04-23 DIAGNOSIS — Z8679 Personal history of other diseases of the circulatory system: Secondary | ICD-10-CM | POA: Diagnosis not present

## 2022-04-23 DIAGNOSIS — Z87898 Personal history of other specified conditions: Secondary | ICD-10-CM | POA: Diagnosis not present

## 2022-04-23 DIAGNOSIS — R9431 Abnormal electrocardiogram [ECG] [EKG]: Secondary | ICD-10-CM

## 2022-04-23 DIAGNOSIS — N179 Acute kidney failure, unspecified: Secondary | ICD-10-CM | POA: Diagnosis not present

## 2022-04-23 DIAGNOSIS — I959 Hypotension, unspecified: Secondary | ICD-10-CM | POA: Diagnosis not present

## 2022-04-23 LAB — CBC
HCT: 22.2 % — ABNORMAL LOW (ref 36.0–46.0)
Hemoglobin: 7.1 g/dL — ABNORMAL LOW (ref 12.0–15.0)
MCH: 30.9 pg (ref 26.0–34.0)
MCHC: 32 g/dL (ref 30.0–36.0)
MCV: 96.5 fL (ref 80.0–100.0)
Platelets: 261 10*3/uL (ref 150–400)
RBC: 2.3 MIL/uL — ABNORMAL LOW (ref 3.87–5.11)
RDW: 14.7 % (ref 11.5–15.5)
WBC: 5.1 10*3/uL (ref 4.0–10.5)
nRBC: 0.4 % — ABNORMAL HIGH (ref 0.0–0.2)

## 2022-04-23 LAB — RENAL FUNCTION PANEL
Albumin: 1.9 g/dL — ABNORMAL LOW (ref 3.5–5.0)
Anion gap: 9 (ref 5–15)
BUN: 15 mg/dL (ref 8–23)
CO2: 20 mmol/L — ABNORMAL LOW (ref 22–32)
Calcium: 7.8 mg/dL — ABNORMAL LOW (ref 8.9–10.3)
Chloride: 107 mmol/L (ref 98–111)
Creatinine, Ser: 1.01 mg/dL — ABNORMAL HIGH (ref 0.44–1.00)
GFR, Estimated: 54 mL/min — ABNORMAL LOW (ref >60)
Glucose, Bld: 136 mg/dL — ABNORMAL HIGH (ref 70–99)
Phosphorus: 2.5 mg/dL (ref 2.5–4.6)
Potassium: 4.1 mmol/L (ref 3.5–5.1)
Sodium: 136 mmol/L (ref 135–145)

## 2022-04-23 LAB — VITAMIN B12: Vitamin B-12: 2782 pg/mL — ABNORMAL HIGH (ref 180–914)

## 2022-04-23 LAB — MAGNESIUM: Magnesium: 2 mg/dL (ref 1.7–2.4)

## 2022-04-23 LAB — GLUCOSE, CAPILLARY
Glucose-Capillary: 125 mg/dL — ABNORMAL HIGH (ref 70–99)
Glucose-Capillary: 125 mg/dL — ABNORMAL HIGH (ref 70–99)
Glucose-Capillary: 137 mg/dL — ABNORMAL HIGH (ref 70–99)

## 2022-04-23 LAB — IRON AND TIBC
Iron: 51 ug/dL (ref 28–170)
Saturation Ratios: 24 % (ref 10.4–31.8)
TIBC: 214 ug/dL — ABNORMAL LOW (ref 250–450)
UIBC: 163 ug/dL

## 2022-04-23 LAB — PREPARE RBC (CROSSMATCH)

## 2022-04-23 LAB — FERRITIN: Ferritin: 175 ng/mL (ref 11–307)

## 2022-04-23 LAB — RETICULOCYTES
Immature Retic Fract: 37.2 % — ABNORMAL HIGH (ref 2.3–15.9)
RBC.: 2.38 MIL/uL — ABNORMAL LOW (ref 3.87–5.11)
Retic Count, Absolute: 77.8 10*3/uL (ref 19.0–186.0)
Retic Ct Pct: 3.3 % — ABNORMAL HIGH (ref 0.4–3.1)

## 2022-04-23 LAB — FOLATE: Folate: 20.5 ng/mL (ref >5.9)

## 2022-04-23 MED ORDER — SODIUM CHLORIDE 0.9% IV SOLUTION: Freq: Once | INTRAVENOUS | Status: AC

## 2022-04-23 MED ORDER — LORAZEPAM 1 MG PO TABS
1.0000 mg | ORAL_TABLET | Freq: Once | ORAL | Status: AC
  Administered 2022-04-24: 1 mg via ORAL
  Filled 2022-04-23: qty 1

## 2022-04-23 NOTE — Progress Notes (Signed)
PROGRESS NOTE  Jessica [name] UEK:[id number] DOB: February 10, 1934   PCP: [name], MD  Patient is from: SNF DOA: 04/18/2022 LOS: 5  Chief complaints Chief Complaint  Patient presents with   left arm pain     Brief Narrative / Interim history: 89 year old F with PMH of diastolic CHF, A-fib/flutter, AVB s/p PPM, AS s/p TAVR, DM-2, anxiety, depression, hypothyroidism, GERD, cognitive impairment, and recent hospitalization from 12/26-1/7 with septic shock due to MSSA bacteremia and TAVR endocarditis on Ancef and rifampin presenting with diarrhea, poor p.o. intake, nausea and bleeding from left arm lesion, and admitted for AKI, hypotension and  COPD exacerbation in the setting of recent COVID-19 infection. Cr 2.6 (baseline 0.8).  CXR with improved aeration of the lung bases with persistent left > right bibasilar renal opacities.  COVID-19 screen positive.  Received IV fluid bolus and started on maintenance IV and admitted.  AKI improved with IV fluid and continued to improve of IV fluid.  IV fluid discontinued due to respiratory distress. Patient had bleeding from left arm and mouth 1/17-1/18.  INR elevated to 10.5.  Received IV vitamin K 5 mg x 1.  Lovenox, rifampin and molnupiravir discontinued.  INR down to 1.4.  Hgb duration anterior at 7.1.  Transfused 1 unit.  Subjective: Seen and examined earlier this morning.  No major events overnight of this morning.  She complains of pain in her left foot.  She is awake and oriented x 4 except to date but not a great historian.  Denies chest pain, shortness of breath or GI symptoms.  We discussed about low hemoglobin and need both blood transfusion.  She agrees.  Objective: Vitals:   04/22/22 1900 04/22/22 1930 04/22/22 2305 04/23/22 0303  BP:   116/64 123/63  Pulse: 78  79 75  Resp:   19 19  Temp:   98.9 F (37.2 C) 98.8 F (37.1 C)  TempSrc:   Axillary Axillary  SpO2: 100% 100% 100% 100%  Weight:      Height:         Examination: GENERAL: No apparent distress.  Nontoxic. HEENT: MMM.  Vision and hearing grossly intact.  NECK: Supple.  No apparent JVD.  RESP:  No IWOB.  Fair aeration bilaterally. CVS:  RRR. Heart sounds normal.  ABD/GI/GU: BS+. Abd soft, NTND.  MSK/EXT:  Moves extremities. DP pulses 2+ bilateral.  No deformity, erythema or focal tenderness in her left foot SKIN: no apparent skin lesion or wound NEURO: Awake and alert. Oriented x 4 except date.  Limited insight.  No apparent focal neuro deficit. PSYCH: Calm. Normal affect.   Procedures:  None  Microbiology summarized: 1/16-COVID-19 PCR positive.  She was also positive at facility before admission. 1/16-influenza and RSV PCR nonreactive.  Assessment and plan: Principal Problem:   AKI (acute kidney injury) ([place]) Active Problems:   Hypotension   History of endocarditis   History of bacteremia   COVID-19 virus infection   COPD with acute exacerbation (HCC)   Diarrhea   Acute on chronic diastolic CHF (congestive heart failure) ([place])   DM type 2, controlled, with complication (HCC)   Atrial fibrillation and flutter (HCC)   Anxiety disorder   Hypothyroidism   Dementia with behavioral disturbance (HCC)   Delirium   ABLA (acute blood loss anemia)   Elevated INR   Prolonged QT interval  Acute kidney injury: prerenal due to poor p.o. intake, diarrhea and concurrent use of diuretics and Entresto.  Resolving.  Off IVF. Recent Labs  04/05/22 0044 04/06/22 0841 04/08/22 0448 04/09/22 0603 04/18/22 1154 04/19/22 0500 04/20/22 1025 04/21/22 0547 04/22/22 1113 04/23/22 0444  BUN 7* 7* 8 9 35* 34* '23 17 12 15  '$ CREATININE 0.70 0.75 0.91 0.82 2.60* 2.46* 1.82* 1.41* 1.09* 1.01*  -Oral hydration. -Recheck in the morning  Acute on chronic combined CHF with recovered EF:  Recent TTE with LVEF of 55 to 60% and G2 DD.  Had EF of 30 to 35% in 2019.  CXR with pulmonary edema and BNP elevated likely from IV fluid.  Appears  euvolemic on exam. -Continue holding home diuretics and Entresto in the setting of AKI.  ABLA due to left forearm bleeding/oral bleeding due to elevated INR: INR 10.8. Likely from rifampin and molnupiravir.  Bleeding stopped after pressure dressing, IV vitamin K, discontinuing Lovenox, rifampin and molnupiravir.  Recent Labs    04/03/22 0035 04/05/22 0044 04/08/22 0448 04/18/22 1154 04/19/22 0500 04/20/22 1025 04/20/22 1855 04/21/22 0547 04/22/22 0429 04/23/22 0444  HGB 10.1* 10.0* 9.7* 9.4* 8.2* 7.9* 7.8* 8.8* 7.4* 7.1*  -Transfuse 1 unit.  Verbally consented. -Check anemia panel. -Recheck CBC in the morning   Hypotension: Likely due to dehydration and BP meds.  Resolved. -Continue holding antihypertensive meds   MSSA bacteremia/TAVR endocarditis: -New ID rec -Continue IV Ancef through 05/11/2022 followed by p.o. cefadroxil indefinitely -Discontinued rifampin on 1/18.  Okay to resume Eliquis a week later -Outpatient follow-up.    COVID-19 infection: Likely contributing to poor p.o. intake, diarrhea and AKI.  Tested positive at facility.  She had been started on molnupiravir 800 mg p.o. twice daily on 1/12 to be continued until 1/17. -Supportive care with inhalers, vitamin C, zinc, antitussive and mucolytic's. -Airborne precautions   COPD, with acute exacerbation: Likely due to COVID-19 infection.  Seems to have improved.   -Given her delirium, will wean off steroid quickly. -Albuterol inhaler   Diarrhea: Seems to have resolved.  She was unable to provide stool samples for C. Difficile.  Diarrhea or solved.   Uncontrolled NIDDM-2 with hyperglycemia: A1c 8.4% (10% on 12/26/2021) Recent Labs  Lab 04/21/22 2145 04/22/22 0850 04/22/22 1616 04/22/22 2233 04/23/22 0816  GLUCAP 155* 144* 160* 140* 125*  -Continue current insulin regimen  Paroxsymal atrial fibrillation/flutter Second-degree AV block s/p PPM -Continue holding Lopressor -Hold anticoagulation in the setting  of bleeding. -Can resume home Eliquis on 1/25 now she is off rifampin.   Delirium/cognitive impairment with behavioral disturbance: Remained stable off restraints. -Reorientation and delirium precautions -Schedule Tylenol for lower back pain with as needed oxycodone -Fall precautions  Anxiety and depression: Stable -Continue venlafaxine, Ativan as needed, and Restoril nightly    Hypothyroidism -Continue levothyroxine  Hypokalemia/hypomagnesemia: -Monitor replenish as appropriate.  Metabolic acidosis: Likely due to renal failure and IV fluid. -Recheck in the morning  Morbid obesity Body mass index is 45 kg/m.           DVT prophylaxis:  SCD for VTE prophylaxis due to bleeding.  Code Status: DNR/DNI Family Communication: None at bedside. Level of care: Med-Surg Status is: Inpatient Remains inpatient appropriate because: Delirium and acute blood loss anemia   Final disposition: SNF Consultants:  None  35 minutes with more than 50% spent in reviewing records, counseling patient/family and coordinating care.   Sch Meds:  Scheduled Meds:  sodium chloride   Intravenous Once   acetaminophen  1,000 mg Oral Q8H   albuterol  2 puff Inhalation BID   allopurinol  100 mg Oral Daily   vitamin C  500 mg Oral Daily   Chlorhexidine Gluconate Cloth  6 each Topical Daily   enoxaparin (LOVENOX) injection  55 mg Subcutaneous Q24H   insulin aspart  0-5 Units Subcutaneous QHS   insulin aspart  0-9 Units Subcutaneous TID WC   levothyroxine  75 mcg Oral Q0600   magic mouthwash w/lidocaine  5 mL Oral QID   multivitamin with minerals  1 tablet Oral Daily   nystatin  1 Application Topical Daily   rosuvastatin  10 mg Oral Q M,W,F   saccharomyces boulardii  250 mg Oral BID   sodium chloride flush  10-40 mL Intracatheter Q12H   sodium chloride flush  3 mL Intravenous Q12H   venlafaxine XR  75 mg Oral Daily   zinc sulfate  220 mg Oral Daily   Continuous Infusions:   ceFAZolin  (ANCEF) IV 2 g (04/23/22 0428)   PRN Meds:.guaiFENesin-dextromethorphan, LORazepam, oxyCODONE, polyvinyl alcohol, sodium chloride flush  Antimicrobials: Anti-infectives (From admission, onward)    Start     Dose/Rate Route Frequency Ordered Stop   04/21/22 0815  ceFAZolin (ANCEF) IVPB 2g/100 mL premix        2 g 200 mL/hr over 30 Minutes Intravenous Every 8 hours 04/21/22 0724     04/20/22 1115  molnupiravir EUA (LAGEVRIO) capsule 800 mg  Status:  Discontinued        4 capsule Oral 2 times daily 04/20/22 1018 04/20/22 1255   04/18/22 2200  molnupiravir EUA (LAGEVRIO) capsule 800 mg        4 capsule Oral 2 times daily 04/18/22 1717 04/20/22 2159   04/18/22 2200  ceFAZolin (ANCEF) IVPB 2g/100 mL premix  Status:  Discontinued        2 g 200 mL/hr over 30 Minutes Intravenous Every 12 hours 04/18/22 1915 04/21/22 0724   04/18/22 1715  ceFAZolin (ANCEF) IVPB  Status:  Discontinued       Note to Pharmacy: Indication:  MSSA PVE First Dose: Yes Last Day of Therapy:  05/11/22 Labs - Once weekly:  CBC/D and CMP, Labs - Every other week:  ESR and CRP Method of administration: IV Push Pull PICC line at the completion of IV antibiotics Method of administration may be changed at the discretion of home i   2 g Intravenous Every 8 hours 04/18/22 1703 04/18/22 1915   04/18/22 1715  rifampin (RIFADIN) capsule 300 mg  Status:  Discontinued       Note to Pharmacy: Stop date 05/11/22     300 mg Oral 3 times daily 04/18/22 1703 04/20/22 1238        I have personally reviewed the following labs and images: CBC: Recent Labs  Lab 04/18/22 1154 04/19/22 0500 04/20/22 1025 04/20/22 1300 04/20/22 1855 04/21/22 0547 04/22/22 0429 04/23/22 0444  WBC 4.3 3.7* 4.5  --   --  7.7 6.7 5.1  NEUTROABS 1.8  --   --   --   --   --   --   --   HGB 9.4* 8.2* 7.9*  --  7.8* 8.8* 7.4* 7.1*  HCT 29.2* 25.0* 23.8*  --  23.7* 26.3* 22.5* 22.2*  MCV 95.4 93.3 93.0  --   --  92.9 93.4 96.5  PLT 219 200 208 210  --   250 244 261   BMP &GFR Recent Labs  Lab 04/19/22 0500 04/20/22 1025 04/21/22 0547 04/22/22 1113 04/23/22 0444  NA 134* 136 136 137 136  K 3.4* 3.3* 4.1 3.3* 4.1  CL 101 103  104 106 107  CO2 19* 18* 18* 24 20*  GLUCOSE 131* 170* 178* 135* 136*  BUN 34* '23 17 12 15  '$ CREATININE 2.46* 1.82* 1.41* 1.09* 1.01*  CALCIUM 7.7* 7.7* 8.2* 7.9* 7.8*  MG  --  1.7 1.6* 1.7 2.0  PHOS  --  1.9* 2.6 2.7 2.5   Estimated Creatinine Clearance: 47.1 mL/min (A) (by C-G formula based on SCr of 1.01 mg/dL (H)). Liver & Pancreas: Recent Labs  Lab 04/20/22 1025 04/21/22 0547 04/22/22 1113 04/23/22 0444  AST 42* 48*  --   --   ALT <5 <5  --   --   ALKPHOS 59 63  --   --   BILITOT 0.8 0.7  --   --   PROT 7.0 7.7  --   --   ALBUMIN 2.1* 2.3* 1.9* 1.9*   No results for input(s): "LIPASE", "AMYLASE" in the last 168 hours. No results for input(s): "AMMONIA" in the last 168 hours. Diabetic: No results for input(s): "HGBA1C" in the last 72 hours.  Recent Labs  Lab 04/21/22 2145 04/22/22 0850 04/22/22 1616 04/22/22 2233 04/23/22 0816  GLUCAP 155* 144* 160* 140* 125*   Cardiac Enzymes: Recent Labs  Lab 04/20/22 1025  CKTOTAL 104   No results for input(s): "PROBNP" in the last 8760 hours. Coagulation Profile: Recent Labs  Lab 04/20/22 1025 04/20/22 1300 04/20/22 1855 04/21/22 0547  INR >10.0* >10.0* 1.9* 1.4*   Thyroid Function Tests: No results for input(s): "TSH", "T4TOTAL", "FREET4", "T3FREE", "THYROIDAB" in the last 72 hours. Lipid Profile: No results for input(s): "CHOL", "HDL", "LDLCALC", "TRIG", "CHOLHDL", "LDLDIRECT" in the last 72 hours. Anemia Panel: No results for input(s): "VITAMINB12", "FOLATE", "FERRITIN", "TIBC", "IRON", "RETICCTPCT" in the last 72 hours.  Urine analysis:    Component Value Date/Time   COLORURINE AMBER (A) 04/18/2022 2244   APPEARANCEUR HAZY (A) 04/18/2022 2244   LABSPEC 1.011 04/18/2022 2244   PHURINE 5.0 04/18/2022 2244   GLUCOSEU NEGATIVE  04/18/2022 2244   GLUCOSEU NEGATIVE 09/07/2020 1244   HGBUR MODERATE (A) 04/18/2022 2244   BILIRUBINUR NEGATIVE 04/18/2022 2244   [place] 04/18/2022 2244   PROTEINUR 30 (A) 04/18/2022 2244   UROBILINOGEN 0.2 09/07/2020 1244   NITRITE NEGATIVE 04/18/2022 2244   LEUKOCYTESUR MODERATE (A) 04/18/2022 2244   Sepsis Labs: Invalid input(s): "PROCALCITONIN", "LACTICIDVEN"  Microbiology: Recent Results (from the past 240 hour(s))  Resp panel by RT-PCR (RSV, Flu A&B, Covid) Anterior Nasal Swab     Status: Abnormal   Collection Time: 04/18/22  2:05 PM   Specimen: Anterior Nasal Swab  Result Value Ref Range Status   SARS Coronavirus 2 by RT PCR POSITIVE (A) NEGATIVE Final    Comment: (NOTE) SARS-CoV-2 target nucleic acids are DETECTED.  The SARS-CoV-2 RNA is generally detectable in upper respiratory specimens during the acute phase of infection. Positive results are indicative of the presence of the identified virus, but do not rule out bacterial infection or co-infection with other pathogens not detected by the test. Clinical correlation with patient history and other diagnostic information is necessary to determine patient infection status. The expected result is Negative.  Fact Sheet for Patients: EntrepreneurPulse.com.au  Fact Sheet for Healthcare Providers: IncredibleEmployment.be  This test is not yet approved or cleared by the [place] FDA and  has been authorized for detection and/or diagnosis of SARS-CoV-2 by FDA under an Emergency Use Authorization (EUA).  This EUA will remain in effect (meaning this test can be used) for the duration of  the COVID-19 declaration under Section 564(b)(1) of the A ct, 21 U.S.C. section 360bbb-3(b)(1), unless the authorization is terminated or revoked sooner.     Influenza A by PCR NEGATIVE NEGATIVE Final   Influenza B by PCR NEGATIVE NEGATIVE Final    Comment: (NOTE) The Xpert Xpress  SARS-CoV-2/FLU/RSV plus assay is intended as an aid in the diagnosis of influenza from Nasopharyngeal swab specimens and should not be used as a sole basis for treatment. Nasal washings and aspirates are unacceptable for Xpert Xpress SARS-CoV-2/FLU/RSV testing.  Fact Sheet for Patients: EntrepreneurPulse.com.au  Fact Sheet for Healthcare Providers: IncredibleEmployment.be  This test is not yet approved or cleared by the [place] FDA and has been authorized for detection and/or diagnosis of SARS-CoV-2 by FDA under an Emergency Use Authorization (EUA). This EUA will remain in effect (meaning this test can be used) for the duration of the COVID-19 declaration under Section 564(b)(1) of the Act, 21 U.S.C. section 360bbb-3(b)(1), unless the authorization is terminated or revoked.     Resp Syncytial Virus by PCR NEGATIVE NEGATIVE Final    Comment: (NOTE) Fact Sheet for Patients: EntrepreneurPulse.com.au  Fact Sheet for Healthcare Providers: IncredibleEmployment.be  This test is not yet approved or cleared by the [place] FDA and has been authorized for detection and/or diagnosis of SARS-CoV-2 by FDA under an Emergency Use Authorization (EUA). This EUA will remain in effect (meaning this test can be used) for the duration of the COVID-19 declaration under Section 564(b)(1) of the Act, 21 U.S.C. section 360bbb-3(b)(1), unless the authorization is terminated or revoked.  Performed at [facility], [place] [address] [place],  [address]     Radiology Studies: No results found.    Heiley Shaikh T. [place]  If 7PM-7AM, please contact night-coverage [email or web address] 04/23/2022, 11:37 AM

## 2022-04-24 ENCOUNTER — Ambulatory Visit: Payer: Medicare Other | Admitting: Internal Medicine

## 2022-04-24 DIAGNOSIS — I110 Hypertensive heart disease with heart failure: Secondary | ICD-10-CM | POA: Diagnosis not present

## 2022-04-24 DIAGNOSIS — R1013 Epigastric pain: Secondary | ICD-10-CM | POA: Diagnosis not present

## 2022-04-24 DIAGNOSIS — E119 Type 2 diabetes mellitus without complications: Secondary | ICD-10-CM | POA: Diagnosis not present

## 2022-04-24 DIAGNOSIS — I4891 Unspecified atrial fibrillation: Secondary | ICD-10-CM | POA: Diagnosis not present

## 2022-04-24 DIAGNOSIS — W19XXXA Unspecified fall, initial encounter: Secondary | ICD-10-CM | POA: Diagnosis not present

## 2022-04-24 DIAGNOSIS — I10 Essential (primary) hypertension: Secondary | ICD-10-CM | POA: Diagnosis not present

## 2022-04-24 DIAGNOSIS — R279 Unspecified lack of coordination: Secondary | ICD-10-CM | POA: Diagnosis not present

## 2022-04-24 DIAGNOSIS — R410 Disorientation, unspecified: Secondary | ICD-10-CM | POA: Diagnosis not present

## 2022-04-24 DIAGNOSIS — F32A Depression, unspecified: Secondary | ICD-10-CM | POA: Diagnosis not present

## 2022-04-24 DIAGNOSIS — F03918 Unspecified dementia, unspecified severity, with other behavioral disturbance: Secondary | ICD-10-CM | POA: Diagnosis not present

## 2022-04-24 DIAGNOSIS — D62 Acute posthemorrhagic anemia: Secondary | ICD-10-CM | POA: Diagnosis not present

## 2022-04-24 DIAGNOSIS — R404 Transient alteration of awareness: Secondary | ICD-10-CM | POA: Diagnosis not present

## 2022-04-24 DIAGNOSIS — F02A18 Dementia in other diseases classified elsewhere, mild, with other behavioral disturbance: Secondary | ICD-10-CM | POA: Diagnosis not present

## 2022-04-24 DIAGNOSIS — I330 Acute and subacute infective endocarditis: Secondary | ICD-10-CM | POA: Diagnosis not present

## 2022-04-24 DIAGNOSIS — R7881 Bacteremia: Secondary | ICD-10-CM | POA: Diagnosis not present

## 2022-04-24 DIAGNOSIS — Z95828 Presence of other vascular implants and grafts: Secondary | ICD-10-CM | POA: Diagnosis not present

## 2022-04-24 DIAGNOSIS — N179 Acute kidney failure, unspecified: Secondary | ICD-10-CM | POA: Diagnosis not present

## 2022-04-24 DIAGNOSIS — R791 Abnormal coagulation profile: Secondary | ICD-10-CM | POA: Diagnosis not present

## 2022-04-24 DIAGNOSIS — E1165 Type 2 diabetes mellitus with hyperglycemia: Secondary | ICD-10-CM | POA: Diagnosis not present

## 2022-04-24 DIAGNOSIS — R0789 Other chest pain: Secondary | ICD-10-CM | POA: Diagnosis not present

## 2022-04-24 DIAGNOSIS — J449 Chronic obstructive pulmonary disease, unspecified: Secondary | ICD-10-CM | POA: Diagnosis not present

## 2022-04-24 DIAGNOSIS — R109 Unspecified abdominal pain: Secondary | ICD-10-CM | POA: Diagnosis not present

## 2022-04-24 DIAGNOSIS — J9601 Acute respiratory failure with hypoxia: Secondary | ICD-10-CM | POA: Diagnosis not present

## 2022-04-24 DIAGNOSIS — I5033 Acute on chronic diastolic (congestive) heart failure: Secondary | ICD-10-CM

## 2022-04-24 DIAGNOSIS — R110 Nausea: Secondary | ICD-10-CM | POA: Diagnosis not present

## 2022-04-24 DIAGNOSIS — E569 Vitamin deficiency, unspecified: Secondary | ICD-10-CM | POA: Diagnosis not present

## 2022-04-24 DIAGNOSIS — G4700 Insomnia, unspecified: Secondary | ICD-10-CM | POA: Diagnosis not present

## 2022-04-24 DIAGNOSIS — Z7984 Long term (current) use of oral hypoglycemic drugs: Secondary | ICD-10-CM | POA: Diagnosis not present

## 2022-04-24 DIAGNOSIS — I259 Chronic ischemic heart disease, unspecified: Secondary | ICD-10-CM | POA: Diagnosis not present

## 2022-04-24 DIAGNOSIS — Z7901 Long term (current) use of anticoagulants: Secondary | ICD-10-CM | POA: Diagnosis not present

## 2022-04-24 DIAGNOSIS — E785 Hyperlipidemia, unspecified: Secondary | ICD-10-CM | POA: Diagnosis not present

## 2022-04-24 DIAGNOSIS — E118 Type 2 diabetes mellitus with unspecified complications: Secondary | ICD-10-CM | POA: Diagnosis not present

## 2022-04-24 DIAGNOSIS — D649 Anemia, unspecified: Secondary | ICD-10-CM | POA: Diagnosis not present

## 2022-04-24 DIAGNOSIS — I959 Hypotension, unspecified: Secondary | ICD-10-CM | POA: Diagnosis not present

## 2022-04-24 DIAGNOSIS — E559 Vitamin D deficiency, unspecified: Secondary | ICD-10-CM | POA: Diagnosis not present

## 2022-04-24 DIAGNOSIS — K219 Gastro-esophageal reflux disease without esophagitis: Secondary | ICD-10-CM | POA: Diagnosis not present

## 2022-04-24 DIAGNOSIS — J441 Chronic obstructive pulmonary disease with (acute) exacerbation: Secondary | ICD-10-CM | POA: Diagnosis not present

## 2022-04-24 DIAGNOSIS — B370 Candidal stomatitis: Secondary | ICD-10-CM | POA: Diagnosis not present

## 2022-04-24 DIAGNOSIS — E039 Hypothyroidism, unspecified: Secondary | ICD-10-CM | POA: Diagnosis not present

## 2022-04-24 DIAGNOSIS — Z743 Need for continuous supervision: Secondary | ICD-10-CM | POA: Diagnosis not present

## 2022-04-24 DIAGNOSIS — Z950 Presence of cardiac pacemaker: Secondary | ICD-10-CM | POA: Diagnosis not present

## 2022-04-24 DIAGNOSIS — I509 Heart failure, unspecified: Secondary | ICD-10-CM | POA: Diagnosis not present

## 2022-04-24 DIAGNOSIS — Z79899 Other long term (current) drug therapy: Secondary | ICD-10-CM | POA: Diagnosis not present

## 2022-04-24 DIAGNOSIS — T826XXD Infection and inflammatory reaction due to cardiac valve prosthesis, subsequent encounter: Secondary | ICD-10-CM | POA: Diagnosis not present

## 2022-04-24 DIAGNOSIS — M6281 Muscle weakness (generalized): Secondary | ICD-10-CM | POA: Diagnosis not present

## 2022-04-24 DIAGNOSIS — R9431 Abnormal electrocardiogram [ECG] [EKG]: Secondary | ICD-10-CM | POA: Diagnosis not present

## 2022-04-24 DIAGNOSIS — I38 Endocarditis, valve unspecified: Secondary | ICD-10-CM | POA: Diagnosis not present

## 2022-04-24 DIAGNOSIS — M109 Gout, unspecified: Secondary | ICD-10-CM | POA: Diagnosis not present

## 2022-04-24 DIAGNOSIS — Z7401 Bed confinement status: Secondary | ICD-10-CM | POA: Diagnosis not present

## 2022-04-24 DIAGNOSIS — B9561 Methicillin susceptible Staphylococcus aureus infection as the cause of diseases classified elsewhere: Secondary | ICD-10-CM | POA: Diagnosis not present

## 2022-04-24 DIAGNOSIS — T827XXA Infection and inflammatory reaction due to other cardiac and vascular devices, implants and grafts, initial encounter: Secondary | ICD-10-CM | POA: Diagnosis not present

## 2022-04-24 DIAGNOSIS — Z8616 Personal history of COVID-19: Secondary | ICD-10-CM | POA: Diagnosis not present

## 2022-04-24 DIAGNOSIS — R079 Chest pain, unspecified: Secondary | ICD-10-CM | POA: Diagnosis not present

## 2022-04-24 DIAGNOSIS — I480 Paroxysmal atrial fibrillation: Secondary | ICD-10-CM | POA: Diagnosis not present

## 2022-04-24 DIAGNOSIS — E876 Hypokalemia: Secondary | ICD-10-CM | POA: Diagnosis not present

## 2022-04-24 DIAGNOSIS — I2510 Atherosclerotic heart disease of native coronary artery without angina pectoris: Secondary | ICD-10-CM | POA: Diagnosis not present

## 2022-04-24 DIAGNOSIS — I5032 Chronic diastolic (congestive) heart failure: Secondary | ICD-10-CM | POA: Diagnosis not present

## 2022-04-24 LAB — RENAL FUNCTION PANEL
Albumin: 1.9 g/dL — ABNORMAL LOW (ref 3.5–5.0)
Anion gap: 8 (ref 5–15)
BUN: 15 mg/dL (ref 8–23)
CO2: 22 mmol/L (ref 22–32)
Calcium: 7.8 mg/dL — ABNORMAL LOW (ref 8.9–10.3)
Chloride: 107 mmol/L (ref 98–111)
Creatinine, Ser: 1 mg/dL (ref 0.44–1.00)
GFR, Estimated: 55 mL/min — ABNORMAL LOW (ref >60)
Glucose, Bld: 116 mg/dL — ABNORMAL HIGH (ref 70–99)
Phosphorus: 2.7 mg/dL (ref 2.5–4.6)
Potassium: 4 mmol/L (ref 3.5–5.1)
Sodium: 137 mmol/L (ref 135–145)

## 2022-04-24 LAB — BPAM RBC
Blood Product Expiration Date: 202402082359
ISSUE DATE / TIME: 202401211457
Unit Type and Rh: 7300

## 2022-04-24 LAB — TYPE AND SCREEN
ABO/RH(D): B POS
Antibody Screen: NEGATIVE
Unit division: 0

## 2022-04-24 LAB — GLUCOSE, CAPILLARY
Glucose-Capillary: 107 mg/dL — ABNORMAL HIGH (ref 70–99)
Glucose-Capillary: 122 mg/dL — ABNORMAL HIGH (ref 70–99)

## 2022-04-24 LAB — CBC
HCT: 25.6 % — ABNORMAL LOW (ref 36.0–46.0)
Hemoglobin: 8.4 g/dL — ABNORMAL LOW (ref 12.0–15.0)
MCH: 31.3 pg (ref 26.0–34.0)
MCHC: 32.8 g/dL (ref 30.0–36.0)
MCV: 95.5 fL (ref 80.0–100.0)
Platelets: 293 10*3/uL (ref 150–400)
RBC: 2.68 MIL/uL — ABNORMAL LOW (ref 3.87–5.11)
RDW: 16 % — ABNORMAL HIGH (ref 11.5–15.5)
WBC: 6.5 10*3/uL (ref 4.0–10.5)
nRBC: 0 % (ref 0.0–0.2)

## 2022-04-24 LAB — MAGNESIUM: Magnesium: 1.7 mg/dL (ref 1.7–2.4)

## 2022-04-24 MED ORDER — ACETAMINOPHEN 500 MG PO TABS: ORAL_TABLET | ORAL | 0 refills | Status: AC

## 2022-04-24 MED ORDER — CEFAZOLIN IV (FOR PTA / DISCHARGE USE ONLY): 2.0000 g | Freq: Three times a day (TID) | INTRAVENOUS | 0 refills | Status: DC

## 2022-04-24 MED ORDER — APIXABAN 5 MG PO TABS: 5.0000 mg | ORAL_TABLET | Freq: Two times a day (BID) | ORAL | 0 refills | Status: DC

## 2022-04-24 MED ORDER — FUROSEMIDE 40 MG PO TABS: 40.0000 mg | ORAL_TABLET | Freq: Every day | ORAL | 2 refills | Status: DC | PRN

## 2022-04-24 MED ORDER — LORAZEPAM 0.5 MG PO TABS: 0.5000 mg | ORAL_TABLET | Freq: Three times a day (TID) | ORAL | 0 refills | Status: AC | PRN

## 2022-04-24 MED ORDER — ENTRESTO 97-103 MG PO TABS: 1.0000 | ORAL_TABLET | Freq: Two times a day (BID) | ORAL | 3 refills | Status: DC

## 2022-04-24 MED ORDER — ALBUTEROL SULFATE HFA 108 (90 BASE) MCG/ACT IN AERS: 2.0000 | INHALATION_SPRAY | Freq: Four times a day (QID) | RESPIRATORY_TRACT | 2 refills | Status: DC | PRN

## 2022-04-24 MED ORDER — PROCHLORPERAZINE EDISYLATE 10 MG/2ML IJ SOLN
5.0000 mg | Freq: Once | INTRAMUSCULAR | Status: AC
  Administered 2022-04-24: 5 mg via INTRAVENOUS
  Filled 2022-04-24: qty 2

## 2022-04-24 NOTE — Care Management Important Message (Signed)
Important Message  Patient Details  Name: ALIMA NASER MRN: [id number] Date of Birth: 1933-12-15   Medicare Important Message Given:  Yes  Patient left prior to IM delivery will mail to the patiant home address.    Kathalina Ostermann 04/24/2022, 2:34 PM

## 2022-04-24 NOTE — TOC Transition Note (Signed)
Transition of Care [facility]) - CM/SW Discharge Note   Patient Details  Name: Jessica [name] MRN: [id number] Date of Birth: 1934/02/23  [facility] of [facility]) CM/SW Contact:  Benard Halsted, LCSW Phone Number: 04/24/2022, 12:39 PM   Clinical Narrative:    Patient will DC to: [facility] Anticipated DC date: 04/24/22 Family notified: Niece, Clinical cytogeneticist by: Corey Harold   Per MD patient ready for DC to [facility]. RN to call report prior to discharge ([phone]). RN, patient, patient's family, and facility notified of DC. Discharge Summary and FL2 sent to facility. DC packet on chart including signed scripts and DNR. Ambulance transport requested for patient.   CSW will sign off for now as social work intervention is no longer needed. Please consult [place] again if new needs arise.     Final next level of care: Skilled Nursing Facility Barriers to Discharge: Barriers Resolved   Patient Goals and CMS Choice CMS Medicare.gov Compare Post Acute Care list provided to:: Patient Represenative (must comment) Choice offered to / list presented to :  (Niece)  Discharge Placement     Existing PASRR number confirmed : 04/24/22          Patient chooses bed at:  [facility]) Patient to be transferred to facility by: [place] Name of family member notified: Niece Patient and family notified of of transfer: 04/24/22  Discharge Plan and Services Additional resources added to the After Visit Summary for   In-house Referral: Clinical Social Work   Post Acute Care Choice: [place]                               Social Determinants of Health (SDOH) Interventions [place]: No Food Insecurity (03/30/2022)  Housing: Low Risk  (03/30/2022)  Transportation Needs: No Transportation Needs (03/30/2022)  Utilities: Not At Risk (03/30/2022)  Alcohol Screen: Low Risk  (07/01/2021)  Depression (PHQ2-9): Low Risk  (07/01/2021)  Financial Resource Strain:  Low Risk  (07/01/2021)  Physical Activity: Inactive (07/01/2021)  Social Connections: Socially Isolated (07/01/2021)  Stress: No Stress Concern Present (07/01/2021)  Tobacco Use: Low Risk  (04/18/2022)     Readmission Risk Interventions    04/21/2022    9:30 AM  Readmission Risk Prevention Plan  Transportation Screening Complete  Medication Review Press photographer) Complete  PCP or Specialist appointment within 3-5 days of discharge Complete  HRI or [place] Complete  SW Recovery Care/Counseling Consult Complete  Palliative Care Screening Not [place] Complete

## 2022-04-24 NOTE — Consult Note (Addendum)
   [facility] Inpatient Consult   04/24/2022  [name] 1933/04/18 [id number]  [place] [facility] [ACO] Patient: [employer]  Primary Care Provider:  Plotnikov, Evie Lacks, MD, [place] at [facility] which is listed to provide the transition of care follow up  Patient screened for less than 30 days readmission hospitalization with noted extreme high risk score for unplanned readmission risk and  to assess for potential [place] Management service needs for post hospital transition for care coordination.  Review of patient's electronic medical record reveals patient is for a skilled nursing facility for rehab at [facility].   Plan:   Referral request for community care coordination: Will alert [facility] PAC RN if patient transitions to a [facility] affiliated facility. Currently, patient is for [facility]  is [corrected] a [facility] affiliated facility at this time. Addendum: Will make the [facility] RN aware of transition.  Of note, [facility] Care Management/Population Health does not replace or interfere with any arrangements made by the Inpatient Transition of Care team.  For questions contact:   Natividad Brood, RN BSN [place]  [phone] business mobile phone Toll free office [phone]  *[place]  [phone] Fax number: [phone] [email or web address]

## 2022-04-24 NOTE — Discharge Summary (Signed)
Physician Discharge Summary  Jessica [name] JQZ:[id number] DOB: [birth year] DOA: 04/18/2022  PCP: [name], MD  Admit date: 04/18/2022 Discharge date: 04/24/2022 Admitted From: SNF Disposition: SNF Recommendations for Outpatient Follow-up:  Follow up with ID on 05/09/2022 Check fluid status, BP, CMP and CBC in 1 week Consider formal cognitive assessment Please follow up on the following pending results: None   Discharge Condition: Stable CODE STATUS: DNR/DNI  Contact information for after-discharge care     Destination     [facility] Preferred SNF .   Service: Skilled Nursing Contact information: [place] [facility] course 89 year old F with PMH of diastolic CHF, A-fib/flutter, AVB s/p PPM, AS s/p TAVR, DM-2, anxiety, depression, hypothyroidism, GERD, cognitive impairment, and recent hospitalization from 12/26-1/7 with septic shock due to MSSA bacteremia and TAVR endocarditis on Ancef and rifampin presenting with diarrhea, poor p.o. intake, nausea and bleeding from left arm lesion, and admitted for AKI, hypotension and  COPD exacerbation in the setting of recent COVID-19 infection. Cr 2.6 (baseline 0.8).  CXR with improved aeration of the lung bases with persistent left > right bibasilar renal opacities.  COVID-19 screen positive.  Received IV fluid bolus and started on maintenance IV and admitted.   AKI improved with IV fluid and continued to improve of IV fluid.  IV fluid discontinued due to respiratory distress. Patient had bleeding from left arm and mouth 1/17-1/18.  INR elevated to 10.5.  Received IV vitamin K 5 mg x 1.  Lovenox, rifampin and molnupiravir discontinued.  INR down to 1.4.  Hgb duration anterior at 7.1.  Transfused 1 unit with appropriate response.  On the day of discharge, AKI and delirium resolved.  H&H stable.  Encourage oral hydration and oral intake.   Outpatient follow-up with ID on 05/09/2022.  May consider formal cognitive assessment.   See individual problem list below for more.   Problems addressed during this hospitalization Principal Problem:   AKI (acute kidney injury) ([place]) Active Problems:   Hypotension   History of endocarditis   History of bacteremia   COVID-19 virus infection   COPD with acute exacerbation (HCC)   Diarrhea   Acute on chronic diastolic CHF (congestive heart failure) ([place])   DM type 2, controlled, with complication (HCC)   Atrial fibrillation and flutter (HCC)   Anxiety disorder   Hypothyroidism   Dementia with behavioral disturbance (HCC)   Delirium   ABLA (acute blood loss anemia)   Elevated INR   Prolonged QT interval   Acute kidney injury: prerenal due to poor p.o. intake, diarrhea and concurrent use of diuretics and Entresto.  Resolving.  Off IVF. Recent Labs    04/06/22 0841 04/08/22 0448 04/09/22 0603 04/18/22 1154 04/19/22 0500 04/20/22 1025 04/21/22 0547 04/22/22 1113 04/23/22 0444 04/24/22 0303  BUN 7* 8 9 35* 34* '23 17 12 15 15  '$ CREATININE 0.75 0.91 0.82 2.60* 2.46* 1.82* 1.41* 1.09* 1.01* 1.00  -Encourage oral hydration. -Change Lasix to as needed -Hold Entresto for 1 week -Recheck renal function in 1 week   Acute on chronic combined CHF with recovered EF:  Recent TTE with LVEF of 55 to 60% and G2 DD.  Had EF of 30 to 35% in 2019.  CXR with pulmonary edema and BNP elevated likely from IV fluid.  Appears euvolemic on exam. -Change Lasix to as  needed -Hold Entresto for 1 week   ABLA due to left forearm bleeding/oral bleeding due to elevated INR: INR 10.8. Likely from rifampin and molnupiravir.  Bleeding stopped after pressure dressing, IV vitamin K, discontinuing Lovenox, rifampin and molnupiravir.  INR down to 1.4.  Anemia panel suggests anemia of chronic disease.  Transfused 1 unit with appropriate response on 1/21 Recent Labs    04/05/22 0044 04/08/22 0448 04/18/22 1154  04/19/22 0500 04/20/22 1025 04/20/22 1855 04/21/22 0547 04/22/22 0429 04/23/22 0444 04/24/22 0303  HGB 10.0* 9.7* 9.4* 8.2* 7.9* 7.8* 8.8* 7.4* 7.1* 8.4*  -Recheck CBC in 1 week   Hypotension/history of hypertension: Likely due to dehydration and BP meds.  Resolved. -Resume home metoprolol. -Hold Entresto for 1 week -Change Lasix to as needed   MSSA bacteremia/TAVR endocarditis: -New ID rec -Continue IV Ancef through 05/11/2022 followed by p.o. cefadroxil indefinitely -Discontinued rifampin on 1/18.  Okay to resume Eliquis a week later -Outpatient follow-up on 05/09/2022.     COVID-19 infection: Likely contributing to poor p.o. intake, diarrhea and AKI.  Tested positive at facility.  Received molnupiravir 1/12-1/17. -Airborne precautions through 04/24/2022 -Supportive care   COPD, with acute exacerbation: Likely due to COVID-19 infection.  Seems to have resolved. -Completed short course of steroid -Albuterol inhaler as needed   Diarrhea: Seems to have resolved.  She was unable to provide stool samples for C. Difficile.  Diarrhea or solved.   Uncontrolled NIDDM-2 with hyperglycemia: A1c 8.4% (10% on 12/26/2021) Recent Labs  Lab 04/23/22 0816 04/23/22 1205 04/23/22 1648 04/23/22 2351 04/24/22 0831  GLUCAP 125* 125* 137* 107* 122*  -Continue home metformin and Prandin. -Liberated diet due to poor p.o. intake   Paroxsymal atrial fibrillation/flutter Second-degree AV block s/p PPM -Continue home metoprolol. -Resume Eliquis on 1/24   Delirium/cognitive impairment with behavioral disturbance: Remained stable off restraints. -Reorientation and delirium precautions -Schedule Tylenol for lower back pain -Fall precautions   Anxiety/depression/insomnia: Stable -Continue venlafaxine, Ativan as needed, and Restoril nightly    Hypothyroidism -Continue levothyroxine   Hypokalemia/hypomagnesemia: -Monitor replenish as appropriate.   Metabolic acidosis: Likely due to renal  failure and IV fluid.  Resolved.   Morbid obesity Body mass index is 45 kg/m.            Vital signs Vitals:   04/24/22 0155 04/24/22 0354 04/24/22 0830 04/24/22 0941  BP: (!) 148/72 (!) 141/65 (!) 147/95   Pulse: 80 78 88   Temp: 97.7 F (36.5 C) 97.9 F (36.6 C) 98 F (36.7 C)   Resp: '19 18 18   '$ Height:      Weight:      SpO2: 96% 98% 97% 97%  TempSrc: Oral Oral Oral   BMI (Calculated):         Discharge exam  GENERAL: No apparent distress.  Nontoxic. HEENT: MMM.  Vision and hearing grossly intact.  NECK: Supple.  No apparent JVD.  RESP:  No IWOB.  Fair aeration bilaterally. CVS:  RRR. Heart sounds normal.  ABD/GI/GU: BS+. Abd soft, NTND.  MSK/EXT:  Moves extremities. No apparent deformity. No edema.  SKIN: no apparent skin lesion or wound NEURO: Sleepy but wakes to voice.  Oriented x 4 except date.  Limited insight.  No apparent focal neuro deficit. PSYCH: Calm. Normal affect.   Discharge Instructions Discharge Instructions     Diet general   Complete by: As directed    Increase activity slowly   Complete by: As directed    No wound care  Complete by: As directed       Allergies as of 04/24/2022       Reactions   Coreg [carvedilol] Other (See Comments)   Weak legs   Relafen [nabumetone] Other (See Comments)   Upset stomach   Calcium Other (See Comments)   Unknown reaction   Codeine Other (See Comments)   Unknown reaction   Lipitor [atorvastatin] Other (See Comments)   Myalgias   Pacerone [amiodarone] Other (See Comments)   Hand tremors   Vasotec [enalapril] Cough   Vioxx [rofecoxib] Other (See Comments)   Unknown reaction   Zocor [simvastatin] Other (See Comments)   Myalgias        Medication List     STOP taking these medications    enoxaparin 120 MG/0.8ML injection Commonly known as: LOVENOX   enoxaparin 80 MG/0.8ML injection Commonly known as: LOVENOX   magnesium oxide 400 (240 Mg) MG tablet Commonly known as: MAG-OX    molnupiravir EUA 200 MG Caps capsule Commonly known as: LAGEVRIO   ondansetron 4 MG tablet Commonly known as: ZOFRAN   potassium chloride SA 20 MEQ tablet Commonly known as: KLOR-CON M   rifampin 300 MG capsule Commonly known as: Rifadin       TAKE these medications    acetaminophen 500 MG tablet Commonly known as: TYLENOL Take 2 tablets (1,000 mg total) by mouth every 8 (eight) hours for 5 days, THEN 2 tablets (1,000 mg total) every 8 (eight) hours as needed for up to 25 days. Start taking on: April 24, 2022   albuterol 108 (90 Base) MCG/ACT inhaler Commonly known as: VENTOLIN HFA Inhale 2 puffs into the lungs every 6 (six) hours as needed for wheezing or shortness of breath.   allopurinol 100 MG tablet Commonly known as: ZYLOPRIM TAKE 1 TABLET BY MOUTH DAILY   apixaban 5 MG Tabs tablet Commonly known as: ELIQUIS Take 1 tablet (5 mg total) by mouth 2 (two) times daily. Start taking on: April 26, 2022 What changed: These instructions start on April 26, 2022. If you are unsure what to do until then, ask your doctor or other care provider.   b complex vitamins tablet Take 1 tablet by mouth daily.   bisacodyl 5 MG EC tablet Commonly known as: DULCOLAX Take 5 mg by mouth daily as needed for mild constipation.   ceFAZolin  IVPB Commonly known as: ANCEF Inject 2 g into the vein every 8 (eight) hours for 20 days. Indication:  MSSA PVE First Dose: Yes Last Day of Therapy:  05/11/22 Labs - Once weekly:  CBC/D and CMP, Labs - Every other week:  ESR and CRP Method of administration: IV Push Pull PICC line at the completion of IV antibiotics Method of administration may be changed at the discretion of home infusion pharmacist based upon assessment of the patient and/or caregiver's ability to self-administer the medication ordered.   Cholecalciferol 25 MCG (1000 UT) tablet Take 1,000 Units by mouth daily.   docusate sodium 100 MG capsule Commonly known as:  COLACE Take 100 mg by mouth daily as needed for mild constipation or moderate constipation.   Entresto 97-103 MG Generic drug: sacubitril-valsartan Take 1 tablet by mouth 2 (two) times daily. Start taking on: May 01, 2022 What changed: These instructions start on May 01, 2022. If you are unsure what to do until then, ask your doctor or other care provider.   EQ LUBRICATING EYE DROPS OP Place 1 drop into both eyes as needed (dry eyes).  ezetimibe 10 MG tablet Commonly known as: ZETIA TAKE 1 TABLET BY MOUTH DAILY   furosemide 40 MG tablet Commonly known as: LASIX Take 1 tablet (40 mg total) by mouth daily as needed for fluid or edema. What changed:  when to take this reasons to take this Another medication with the same name was removed. Continue taking this medication, and follow the directions you see here.   HEPARIN LOCK FLUSH IV Inject 10 mLs into the vein 3 (three) times daily.   levothyroxine 75 MCG tablet Commonly known as: SYNTHROID TAKE 1 TABLET BY MOUTH DAILY   LORazepam 0.5 MG tablet Commonly known as: ATIVAN Take 1 tablet (0.5 mg total) by mouth every 8 (eight) hours as needed for up to 5 days. for anxiety What changed: when to take this   metFORMIN 500 MG tablet Commonly known as: GLUCOPHAGE TAKE 1 TABLET BY MOUTH TWICE  DAILY WITH MEALS   metoprolol tartrate 25 MG tablet Commonly known as: LOPRESSOR Take 1 tablet (25 mg total) by mouth 2 (two) times daily.   multivitamin tablet Take 1 tablet by mouth daily.   nystatin powder Generic drug: nystatin Apply 1 Application topically daily. Apply to under breast topically every day shift for redness/moisture.   OXYGEN Inhale 2 L into the lungs 3 (three) times daily.   polyvinyl alcohol 1.4 % ophthalmic solution Commonly known as: LIQUIFILM TEARS 1 drop every 8 (eight) hours as needed for dry eyes.   repaglinide 2 MG tablet Commonly known as: PRANDIN TAKE 2 TABLETS BY MOUTH 3  TIMES DAILY  BEFORE MEALS What changed: See the new instructions.   rosuvastatin 10 MG tablet Commonly known as: CRESTOR TAKE 1 TABLET BY MOUTH UP TO 3  TIMES WEEKLY AS TOLERATED What changed:  how much to take how to take this when to take this additional instructions   saccharomyces boulardii 250 MG capsule Commonly known as: FLORASTOR Take 250 mg by mouth 2 (two) times daily.   senna 8.6 MG Tabs tablet Commonly known as: SENOKOT Take 1 tablet by mouth daily as needed for mild constipation.   sodium chloride flush 0.9 % Soln Commonly known as: NS Inject 10 mLs into the vein 3 (three) times daily. Until 02.08.2024   temazepam 7.5 MG capsule Commonly known as: RESTORIL Take 7.5 mg by mouth at bedtime.   venlafaxine XR 75 MG 24 hr capsule Commonly known as: EFFEXOR-XR TAKE 1 CAPSULE BY MOUTH  DAILY WITH BREAKFAST What changed:  how much to take how to take this when to take this additional instructions        Consultations: None  Procedures/Studies:   [place] EKG SITE RITE  Result Date: 04/20/2022 If Site Rite image not attached, placement could not be confirmed due to current cardiac rhythm.  DG CHEST PORT 1 VIEW  Result Date: 04/20/2022 CLINICAL DATA:  Shortness of breath. EXAM: PORTABLE CHEST 1 VIEW COMPARISON:  04/18/2022. FINDINGS: Heart is enlarged and the mediastinal contour stable. A TAVR stent is noted. There is atherosclerotic calcification of the aorta. The pulmonary vasculature is distended. Mild airspace disease is noted bilaterally. No effusion or pneumothorax. A dual lead pacemaker is present over the left chest. No acute osseous abnormality. IMPRESSION: 1. Cardiomegaly with pulmonary vascular congestion. 2. Mild airspace disease bilaterally, possible edema versus pneumonia. Electronically Signed   By: Brett Fairy M.D.   On: 04/20/2022 00:46   DG Chest Port 1 View  Result Date: 04/18/2022 CLINICAL DATA:  Shortness of breath.  COVID  positive. EXAM: PORTABLE CHEST  1 VIEW COMPARISON:  AP chest 03/30/2022 and 03/28/2022; AP chest 01/14/2019, chest two views 01/10/2019; CT chest 07/09/2019 FINDINGS: Left chest wall cardiac pacer is again seen with leads overlying the right atrium and right ventricle. Iliac aortic valve prosthesis is noted. Cardiac silhouette is again mildly enlarged. Mediastinal contours are within limits. Mild-to-moderate calcification within aortic arch. Mild chronic interstitial thickening is unchanged. Improved aeration of the lung bases with persistent left greater than right basilar linear opacities. No large pleural effusion is seen. No pneumothorax. Mild-to-moderate multilevel degenerative disc changes of the thoracic spine. Note is made of a large sliding hiatal hernia better seen on prior CT. IMPRESSION: Compared to 03/30/2022: Improved aeration of the lung bases with persistent left greater than right basilar linear opacities likely representing atelectasis. It is difficult to entirely exclude residual left basilar pneumonia. Electronically Signed   By: Yvonne Kendall M.D.   On: 04/18/2022 12:38   [place] EKG SITE RITE  Result Date: 04/06/2022 If Site Rite image not attached, placement could not be confirmed due to current cardiac rhythm.  ECHO TEE  Result Date: 04/05/2022    TRANSESOPHOGEAL ECHO REPORT   Patient Name:   Jessica [name] Date of Exam: 04/05/2022 Medical Rec #:  [id number]          Height:       62.0 in Accession #:    [id number]         Weight:       263.0 lb Date of Birth:  July 04, 1933          BSA:          2.148 m Patient Age:    66 years           BP:           110/53 mmHg Patient Gender: F                  HR:           92 bpm. Exam Location:  Inpatient Procedure: 3D Echo, Transesophageal Echo, Cardiac Doppler and Color Doppler Indications:     Endocarditis  History:         Patient has prior history of Echocardiogram examinations, most                  recent 03/29/2022. Pacemaker and Abnormal ECG, Aortic Valve                   Disease, Arrythmias:Atrial Fibrillation,                  Signs/Symptoms:Bacteremia, Altered Mental Status, Dyspnea and                  Shortness of Breath; Risk Factors:Hypertension, Diabetes and                  Dyslipidemia. Aortic stenosis.                  Aortic Valve: 26 mm Sapien prosthetic, stented (TAVR) valve is                  present in the aortic position. Procedure Date: 01/14/2019.  Sonographer:     Roseanna Rainbow RDCS Referring Phys:  [id number] HAO MENG Diagnosing Phys: [place] PROCEDURE: After discussion of the risks and benefits of a TEE, an informed consent was obtained from the patient. The transesophogeal probe was passed without difficulty through the esophogus  of the patient. Imaged were obtained with the patient in a left lateral decubitus position. Sedation performed by different physician. The patient was monitored while under deep sedation. Anesthestetic sedation was provided intravenously by Anesthesiology: '294mg'$  of Propofol, '100mg'$  of Lidocaine. The patient's vital signs; including heart rate, blood pressure, and oxygen saturation; remained stable throughout the procedure. The patient developed no complications during the procedure.  IMPRESSIONS  1. Left ventricular ejection fraction, by estimation, is 55 to 60%. The left ventricle has normal function.  2. No vegetation seen on the device lead. Right ventricular systolic function is normal. The right ventricular size is normal.  3. No left atrial/left atrial appendage thrombus was detected.  4. Moderate pleural effusion.  5. The mitral valve is degenerative. Trivial mitral valve regurgitation. Moderate mitral annular calcification.  6. 26 mm Sapien prosthetic valve, procedure date 01/14/2019. 3 cm x 0.3 cm vegetation on the TAVR on the RCC (seen best image 81). No distinct abscess. Mild PVL. No significant rocking or dehiscence. There is a 26 mm Sapien prosthetic (TAVR) valve present in the aortic position. Procedure Date: 01/14/2019.  7.  There is mild (Grade II) plaque. Conclusion(s)/Recommendation(s): 3cm vegetation seen on the TAVR prosthesis. No vegetation seen on the device lead or the native cardiac valves. FINDINGS  Left Ventricle: Left ventricular ejection fraction, by estimation, is 55 to 60%. The left ventricle has normal function. The left ventricular internal cavity size was normal in size. Right Ventricle: No vegetation seen on the device lead. The right ventricular size is normal. Right ventricular systolic function is normal. Left Atrium: No left atrial/left atrial appendage thrombus was detected. Mitral Valve: The mitral valve is degenerative in appearance. Moderate mitral annular calcification. Trivial mitral valve regurgitation. Tricuspid Valve: Tricuspid valve regurgitation is mild. Aortic Valve: 26 mm Sapien prosthetic valve, procedure date 01/14/2019. 3 cm x 0.3 cm vegetation on the TAVR on the RCC (seen best image 81). No distinct abscess. Mild PVL. No significant rocking or dehiscence. There is a 26 mm Sapien prosthetic, stented  (TAVR) valve present in the aortic position. Procedure Date: 01/14/2019. Pulmonic Valve: Pulmonic valve regurgitation is trivial. Aorta: There is mild (Grade II) plaque. IAS/Shunts: No atrial level shunt detected by color flow Doppler. Additional Comments: A device lead is visualized. There is a moderate pleural effusion. Spectral Doppler performed. Phineas Inches Electronically signed by Phineas Inches Signature Date/Time: 04/05/2022/12:04:22 PM    Final (Updated)    CT HEAD WO CONTRAST (5MM)  Result Date: 03/30/2022 CLINICAL DATA:  Stroke follow-up EXAM: CT HEAD WITHOUT CONTRAST TECHNIQUE: Contiguous axial images were obtained from the base of the skull through the vertex without intravenous contrast. RADIATION DOSE REDUCTION: This exam was performed according to the departmental dose-optimization program which includes automated exposure control, adjustment of the mA and/or kV according to patient size  and/or use of iterative reconstruction technique. COMPARISON:  None Available. FINDINGS: Brain: No acute intracranial hemorrhage. No focal mass lesion. No CT evidence of acute infarction. No midline shift or mass effect. No hydrocephalus. Basilar cisterns are patent. There are periventricular and subcortical white matter hypodensities. Generalized cortical atrophy. Prominent frontal atrophy. Vascular: No hyperdense vessel or unexpected calcification. Skull: Normal. Negative for fracture or focal lesion. Sinuses/Orbits: Paranasal sinuses and mastoid air cells are clear. Orbits are clear. Other: None. IMPRESSION: 1. No interval change from CT 03/08/2022. 2. No acute cortical infarction Electronically Signed   By: Suzy Bouchard M.D.   On: 03/30/2022 11:54   DG Chest [facility]  Result Date: 03/30/2022 CLINICAL DATA:  5626 with acute respiratory failure. 10027 with tachypnea. EXAM: PORTABLE CHEST 1 VIEW COMPARISON:  Portable chest 03/28/2022, chest CT 07/09/2019. FINDINGS: 3:28 a.m. A left chest dual lead pacing system and wire insertions are unaltered. Large hiatal hernia again noted, and old TAVR. There is aortic atherosclerosis and tortuosity with stable mediastinum. There is mild cardiomegaly. Perihilar vascular prominence is noted without overt edema. There is worsening patchy consolidation in the lower lung fields most likely due to pneumonia or aspiration. There are small pleural effusions. The mid to upper lung fields remain clear. Overall aeration is otherwise unchanged. IMPRESSION: 1. Worsening patchy consolidation in the lower lung fields most likely due to pneumonia or aspiration. Small pleural effusions. 2. Perihilar vascular prominence without overt edema. 3. Large hiatal hernia. Electronically Signed   By: Telford Nab M.D.   On: 03/30/2022 04:23   ECHOCARDIOGRAM COMPLETE  Result Date: 03/29/2022    ECHOCARDIOGRAM REPORT   Patient Name:   Jessica [name] Date of Exam: 03/29/2022 Medical  Rec #:  [id number]          Height:       65.0 in Accession #:    [id number]         Weight:       240.0 lb Date of Birth:  [birth year]          BSA:          2.138 m Patient Age:    72 years           BP:           119/46 mmHg Patient Gender: F                  HR:           82 bpm. Exam Location:  Inpatient Procedure: 2D Echo, Cardiac Doppler, Color Doppler and Intracardiac            Opacification Agent Indications:    Congestive Heart Failure I50.9  History:        Patient has prior history of Echocardiogram examinations, most                 recent 01/07/2020. Pacemaker, Arrythmias:Atrial Flutter and                 Atrial Fibrillation; Risk Factors:Diabetes, Hypertension,                 Dyslipidemia and GERD.                 Aortic Valve: 26 mm Sapien prosthetic, stented (TAVR) valve is                 present in the aortic position. Procedure Date: 01/14/19.  Sonographer:    Bernadene Person RDCS Referring Phys: [id number] Lowella Dell REESE  Sonographer Comments: Patient is obese. IMPRESSIONS  1. The aortic valve has been repaired/replaced. There is a 26 mm Sapien prosthetic (TAVR) valve present in the aortic position. Procedure Date: 01/14/19. Aortic valve mean gradient measures 15.0 mmHg. Aortic valve Vmax measures 2.61 m/s. EOA 1.9cm2, DI 0.5. There is mild paravalvular leak located at the 2 o'clock position on PSAX view.  2. Left ventricular ejection fraction, by estimation, is 55 to 60%. The left ventricle has normal function. Septal motion is paradoxical in the setting of RV pacing. There is moderate asymmetric left ventricular hypertrophy of the basal-septal segment. Left ventricular diastolic parameters are consistent with Grade  II diastolic dysfunction (pseudonormalization).  3. Right ventricular systolic function is normal. The right ventricular size is normal. There is normal pulmonary artery systolic pressure. The estimated right ventricular systolic pressure is 63.1 mmHg.  4. Left atrial size was mildly  dilated.  5. The mitral valve is degenerative. Trivial mitral valve regurgitation. Moderate mitral annular calcification.  6. Aortic dilatation noted. There is borderline dilatation of the ascending aorta, measuring 37 mm.  7. The inferior vena cava is dilated in size with >50% respiratory variability, suggesting right atrial pressure of 8 mmHg. Comparison(s): Compared to prior TTE in 2021, the mean aortic gradient is 15 from 30mHg but EOA similar at ~2cm2. FINDINGS  Left Ventricle: Left ventricular ejection fraction, by estimation, is 55 to 60%. The left ventricle has normal function. The left ventricle has no regional wall motion abnormalities. Definity contrast agent was given IV to delineate the left ventricular  endocardial borders. The left ventricular internal cavity size was normal in size. There is moderate asymmetric left ventricular hypertrophy of the basal-septal segment. Abnormal (paradoxical) septal motion, consistent with RV pacemaker. Left ventricular diastolic parameters are consistent with Grade II diastolic dysfunction (pseudonormalization). Right Ventricle: The right ventricular size is normal. Right vetricular wall thickness was not well visualized. Right ventricular systolic function is normal. There is normal pulmonary artery systolic pressure. The tricuspid regurgitant velocity is 2.61 m/s, and with an assumed right atrial pressure of 8 mmHg, the estimated right ventricular systolic pressure is 349.7mmHg. Left Atrium: Left atrial size was mildly dilated. Right Atrium: Right atrial size was normal in size. Pericardium: There is no evidence of pericardial effusion. Mitral Valve: The mitral valve is degenerative in appearance. There is mild thickening of the mitral valve leaflet(s). There is mild calcification of the mitral valve leaflet(s). Moderate mitral annular calcification. Trivial mitral valve regurgitation. Tricuspid Valve: The tricuspid valve is normal in structure. Tricuspid valve  regurgitation is mild. Aortic Valve: Mild paravalvular leak located at the 2o'clock position on PSAX view. DI 0.5. The aortic valve has been repaired/replaced. Aortic valve mean gradient measures 15.0 mmHg. Aortic valve peak gradient measures 27.2 mmHg. EOA 1.9cm2. There is a 26 mm Sapien prosthetic, stented (TAVR) valve present in the aortic position. Procedure Date: 01/14/19. Pulmonic Valve: The pulmonic valve was normal in structure. Pulmonic valve regurgitation is trivial. Aorta: Aortic dilatation noted. There is borderline dilatation of the ascending aorta, measuring 37 mm. Venous: The inferior vena cava is dilated in size with greater than 50% respiratory variability, suggesting right atrial pressure of 8 mmHg. IAS/Shunts: The atrial septum is grossly normal. Additional Comments: A device lead is visualized.  LEFT VENTRICLE PLAX 2D LVIDd:         5.20 cm     Diastology LVIDs:         3.00 cm     LV e' medial:    4.56 cm/s LV PW:         0.80 cm     LV E/e' medial:  34.2 LV IVS:        0.80 cm     LV e' lateral:   4.56 cm/s LVOT diam:     2.60 cm     LV E/e' lateral: 34.2 LV SV:         127 LV SV Index:   60 LVOT Area:     5.31 cm  LV Volumes (MOD) LV vol d, MOD A2C: 75.8 ml LV vol d, MOD A4C: 72.9 ml LV vol s, MOD A2C:  34.8 ml LV vol s, MOD A4C: 32.8 ml LV SV MOD A2C:     41.0 ml LV SV MOD A4C:     72.9 ml LV SV MOD BP:      41.9 ml RIGHT VENTRICLE RV S prime:     14.90 cm/s TAPSE (M-mode): 1.8 cm LEFT ATRIUM             Index        RIGHT ATRIUM           Index LA diam:        5.00 cm 2.34 cm/m   RA Area:     16.90 cm LA Vol (A2C):   72.9 ml 34.11 ml/m  RA Volume:   41.40 ml  19.37 ml/m LA Vol (A4C):   81.4 ml 38.08 ml/m LA Biplane Vol: 77.1 ml 36.07 ml/m  AORTIC VALVE AV Area (Vmax):    2.56 cm AV Area (Vmean):   2.46 cm AV Area (VTI):     2.68 cm AV Vmax:           261.00 cm/s AV Vmean:          185.667 cm/s AV VTI:            0.475 m AV Peak Grad:      27.2 mmHg AV Mean Grad:      15.0 mmHg LVOT  Vmax:         126.00 cm/s LVOT Vmean:        85.900 cm/s LVOT VTI:          0.240 m LVOT/AV VTI ratio: 0.50  AORTA Ao Root diam: 3.30 cm Ao Asc diam:  3.70 cm MITRAL VALVE                TRICUSPID VALVE MV Area (PHT): 3.91 cm     TR Peak grad:   27.2 mmHg MV Decel Time: 194 msec     TR Vmax:        261.00 cm/s MV E velocity: 156.00 cm/s MV A velocity: 84.60 cm/s   SHUNTS MV E/A ratio:  1.84         Systemic VTI:  0.24 m                             Systemic Diam: 2.60 cm Gwyndolyn Kaufman MD Electronically signed by Gwyndolyn Kaufman MD Signature Date/Time: 03/29/2022/11:52:14 AM    Final    CT ANGIO HEAD NECK W WO CM  Result Date: 03/28/2022 CLINICAL DATA:  Provided history: Altered mental status, septic, right lower extremity weakness. EXAM: CT ANGIOGRAPHY HEAD AND NECK TECHNIQUE: Multidetector CT imaging of the head and neck was performed using the standard protocol during bolus administration of intravenous contrast. Multiplanar CT image reconstructions and MIPs were obtained to evaluate the vascular anatomy. Carotid stenosis measurements (when applicable) are obtained utilizing NASCET criteria, using the distal internal carotid diameter as the denominator. RADIATION DOSE REDUCTION: This exam was performed according to the departmental dose-optimization program which includes automated exposure control, adjustment of the mA and/or kV according to patient size and/or use of iterative reconstruction technique. CONTRAST:  31m OMNIPAQUE IOHEXOL 350 MG/ML SOLN COMPARISON:  Report from brain MRI 03/23/2002 (images unavailable). FINDINGS: CT HEAD FINDINGS Brain: Frontal predominant cerebral atrophy. Questionable intermediate to low-density subdural collection overlying the left cerebral hemisphere, measuring up to 6 mm in thickness (for instance as seen on series 8, image 34). No  more than mild mass effect upon the underlying left cerebral hemisphere. No midline shift. Mild patchy and ill-defined hypoattenuation  within the cerebral white matter, nonspecific but compatible with chronic small vessel ischemic disease. No demarcated cortical infarct. No evidence of an intracranial mass. Vascular: No hyperdense vessel. Atherosclerotic calcifications. Skull: No fracture or aggressive osseous lesion. Sinuses/Orbits: No mass or acute finding within the imaged orbits. Trace mucosal thickening scattered within the paranasal sinuses. Other: 10 mm lucent lesion within the anterior maxilla at midline. This may reflect an incisive canal cyst or a residual cyst. Review of the MIP images confirms the above findings CTA NECK FINDINGS Aortic arch: Standard aortic branching. Atherosclerotic plaque within the visualized aortic arch and proximal major branch vessels of the neck No hemodynamically significant innominate or proximal subclavian artery stenosis. Right carotid system: CCA and ICA patent within the neck without stenosis. Mild atherosclerotic plaque within the mid to distal CCA and about the carotid bifurcation. Left carotid system: CCA and ICA patent within the neck without hemodynamically significant stenosis (50% or greater). Atherosclerotic plaque, greatest about the carotid bifurcation and within the proximal ICA. Vertebral arteries: Vertebral arteries codominant and patent within the neck without hemodynamically significant stenosis. Nonstenotic atherosclerotic plaque at the origin of the right vertebral artery. Skeleton: Cervical spondylosis. Bilateral facet joint ankylosis at C2-C3. No acute fracture or aggressive osseous lesion. Other neck: No neck mass or cervical lymphadenopathy. Upper chest: No consolidation within the imaged lung apices. Review of the MIP images confirms the above findings CTA HEAD FINDINGS Anterior circulation: The intracranial internal carotid arteries are patent. Atherosclerotic plaque within both vessels without stenosis. The M1 middle cerebral arteries are patent. No M2 proximal branch occlusion or  high-grade proximal stenosis. The anterior cerebral arteries are patent. No intracranial aneurysm is identified. Posterior circulation: The intracranial vertebral arteries are patent. Sclerotic plaque within the left V4 segment without stenosis. The basilar artery is patent. The posterior cerebral arteries are patent. A right posterior communicating artery is present. The left posterior communicating artery is diminutive or absent. Venous sinuses: Within the limitations of contrast timing, no convincing thrombus. Anatomic variants: As described. Review of the MIP images confirms the above findings CT head impression #1 called by telephone at the time of interpretation on 03/28/2022 at 2:26 pm to provider Georgina Snell , who verbally acknowledged these results. IMPRESSION: CT head: 1. Questionable intermediate to low-density subdural collection overlying the left cerebral hemisphere (measuring up to 6 mm). This may reflect artifact or a subacute-to-chronic subdural hematoma. However, given the provided history of sepsis, a subdural empyema cannot be excluded. Consider a brain MRI without and with contrast for further evaluation, if the patient is able to have one. No more than mild mass effect upon the underlying left cerebral hemisphere. No midline shift. 2. Mild chronic small-vessel ischemic changes within the cerebral white matter. 3. Frontal predominant cerebral atrophy. CTA neck: 1. The common carotid, internal carotid and vertebral arteries are patent within the neck without hemodynamically significant stenosis. Atherosclerotic plaque within these vessels, as described. 2.  Aortic Atherosclerosis (ICD10-I70.0). CTA head: Intracranial atherosclerotic disease, as described. No intracranial large vessel occlusion or proximal high-grade arterial stenosis identified. Electronically Signed   By: Kellie Simmering D.O.   On: 03/28/2022 14:28   CT ABDOMEN PELVIS W CONTRAST  Result Date: 03/28/2022 CLINICAL DATA:   Altered mental status. Sepsis. Right lower extremity weakness. EXAM: CT ABDOMEN AND PELVIS WITH CONTRAST TECHNIQUE: Multidetector CT imaging of the abdomen and pelvis was performed using  the standard protocol following bolus administration of intravenous contrast. RADIATION DOSE REDUCTION: This exam was performed according to the departmental dose-optimization program which includes automated exposure control, adjustment of the mA and/or kV according to patient size and/or use of iterative reconstruction technique. CONTRAST:  24m OMNIPAQUE IOHEXOL 350 MG/ML SOLN COMPARISON:  01/07/2019 FINDINGS: Lower chest: Mild patchy density at the lung bases that could be atelectasis or minimal basilar pneumonia. Hiatal hernia is present. 8 mm nodule previously seen in the lingula is unchanged since October of 21610and therefore certainly benign. Hepatobiliary: Liver parenchyma is normal. Previous cholecystectomy. Pancreas: Normal Spleen: Normal Adrenals/Urinary Tract: Adrenal glands are normal. Right kidney is normal. Left kidney is normal except for a nonobstructing 3 mm stone in the lower pole. No hydroureteronephrosis. No stone in the bladder Stomach/Bowel: Hiatal hernia as noted above. No acute gastric finding. Small-bowel is normal. No acute colon finding. Diverticulosis of the left colon without visible diverticulitis. Vascular/Lymphatic: Aortic atherosclerosis. No aneurysm. IVC is normal. No adenopathy Reproductive: No pelvic mass of significance. Small uterine leiomyomas. Other: No free fluid or air. Musculoskeletal: Ordinary spinal degenerative changes. Osteoarthritis of both hips. IMPRESSION: 1. No acute abdominal or pelvic finding. 2. Mild patchy density at the lung bases that could be atelectasis or minimal basilar pneumonia. 3. Hiatal hernia. 4. Previous cholecystectomy. 5. 3 mm nonobstructing stone in the lower pole of the left kidney. 6. Diverticulosis of the left colon without visible diverticulitis. 7. Aortic  atherosclerosis. Aortic Atherosclerosis (ICD10-I70.0). Electronically Signed   By: MNelson ChimesM.D.   On: 03/28/2022 14:00   DG Chest Port 1 View  Result Date: 03/28/2022 CLINICAL DATA:  Sepsis. EXAM: PORTABLE CHEST 1 VIEW COMPARISON:  January 14, 2019. FINDINGS: Stable cardiomediastinal silhouette. Left-sided pacemaker is unchanged in position. Mild central pulmonary vascular congestion is noted. Status post transcatheter aortic valve repair. Mild bibasilar subsegmental atelectasis is noted. Bony thorax is unremarkable. IMPRESSION: Stable cardiomediastinal silhouette with probable mild central pulmonary vascular congestion. Mild bibasilar subsegmental atelectasis. Electronically Signed   By: JMarijo ConceptionM.D.   On: 03/28/2022 11:11       The results of significant diagnostics from this hospitalization (including imaging, microbiology, ancillary and laboratory) are listed below for reference.     Microbiology: Recent Results (from the past 240 hour(s))  Resp panel by RT-PCR (RSV, Flu A&B, Covid) Anterior Nasal Swab     Status: Abnormal   Collection Time: 04/18/22  2:05 PM   Specimen: Anterior Nasal Swab  Result Value Ref Range Status   SARS Coronavirus 2 by RT PCR POSITIVE (A) NEGATIVE Final    Comment: (NOTE) SARS-CoV-2 target nucleic acids are DETECTED.  The SARS-CoV-2 RNA is generally detectable in upper respiratory specimens during the acute phase of infection. Positive results are indicative of the presence of the identified virus, but do not rule out bacterial infection or co-infection with other pathogens not detected by the test. Clinical correlation with patient history and other diagnostic information is necessary to determine patient infection status. The expected result is Negative.  Fact Sheet for Patients: hEntrepreneurPulse.com.au Fact Sheet for Healthcare Providers: hIncredibleEmployment.be This test is not yet approved or  cleared by the [place] and  has been authorized for detection and/or diagnosis of SARS-CoV-2 by FDA under an Emergency Use Authorization (EUA).  This EUA will remain in effect (meaning this test can be used) for the duration of  the COVID-19 declaration under Section 564(b)(1) of the A ct, 21 U.S.C. section 360bbb-3(b)(1),  unless the authorization is terminated or revoked sooner.     Influenza A by PCR NEGATIVE NEGATIVE Final   Influenza B by PCR NEGATIVE NEGATIVE Final    Comment: (NOTE) The Xpert Xpress SARS-CoV-2/FLU/RSV plus assay is intended as an aid in the diagnosis of influenza from Nasopharyngeal swab specimens and should not be used as a sole basis for treatment. Nasal washings and aspirates are unacceptable for Xpert Xpress SARS-CoV-2/FLU/RSV testing.  Fact Sheet for Patients: EntrepreneurPulse.com.au  Fact Sheet for Healthcare Providers: IncredibleEmployment.be  This test is not yet approved or cleared by the [place] FDA and has been authorized for detection and/or diagnosis of SARS-CoV-2 by FDA under an Emergency Use Authorization (EUA). This EUA will remain in effect (meaning this test can be used) for the duration of the COVID-19 declaration under Section 564(b)(1) of the Act, 21 U.S.C. section 360bbb-3(b)(1), unless the authorization is terminated or revoked.     Resp Syncytial Virus by PCR NEGATIVE NEGATIVE Final    Comment: (NOTE) Fact Sheet for Patients: EntrepreneurPulse.com.au  Fact Sheet for Healthcare Providers: IncredibleEmployment.be  This test is not yet approved or cleared by the [place] FDA and has been authorized for detection and/or diagnosis of SARS-CoV-2 by FDA under an Emergency Use Authorization (EUA). This EUA will remain in effect (meaning this test can be used) for the duration of the COVID-19 declaration under Section 564(b)(1) of the Act, 21  U.S.C. section 360bbb-3(b)(1), unless the authorization is terminated or revoked.  Performed at [facility], [place] [address], [place], [place] [address]      Labs:  CBC: Recent Labs  Lab 04/18/22 1154 04/19/22 0500 04/20/22 1025 04/20/22 1300 04/20/22 1855 04/21/22 0547 04/22/22 0429 04/23/22 0444 04/24/22 0303  WBC 4.3   < > 4.5  --   --  7.7 6.7 5.1 6.5  NEUTROABS 1.8  --   --   --   --   --   --   --   --   HGB 9.4*   < > 7.9*  --  7.8* 8.8* 7.4* 7.1* 8.4*  HCT 29.2*   < > 23.8*  --  23.7* 26.3* 22.5* 22.2* 25.6*  MCV 95.4   < > 93.0  --   --  92.9 93.4 96.5 95.5  PLT 219   < > 208 210  --  250 244 261 293   < > = values in this interval not displayed.   BMP &GFR Recent Labs  Lab 04/20/22 1025 04/21/22 0547 04/22/22 1113 04/23/22 0444 04/24/22 0303  NA 136 136 137 136 137  K 3.3* 4.1 3.3* 4.1 4.0  CL 103 104 106 107 107  CO2 18* 18* 24 20* 22  GLUCOSE 170* 178* 135* 136* 116*  BUN '23 17 12 15 15  '$ CREATININE 1.82* 1.41* 1.09* 1.01* 1.00  CALCIUM 7.7* 8.2* 7.9* 7.8* 7.8*  MG 1.7 1.6* 1.7 2.0 1.7  PHOS 1.9* 2.6 2.7 2.5 2.7   Estimated Creatinine Clearance: 47.6 mL/min (by C-G formula based on SCr of 1 mg/dL). Liver & Pancreas: Recent Labs  Lab 04/20/22 1025 04/21/22 0547 04/22/22 1113 04/23/22 0444 04/24/22 0303  AST 42* 48*  --   --   --   ALT <5 <5  --   --   --   ALKPHOS 59 63  --   --   --   BILITOT 0.8 0.7  --   --   --   PROT 7.0 7.7  --   --   --  ALBUMIN 2.1* 2.3* 1.9* 1.9* 1.9*   No results for input(s): "LIPASE", "AMYLASE" in the last 168 hours. No results for input(s): "AMMONIA" in the last 168 hours. Diabetic: No results for input(s): "HGBA1C" in the last 72 hours. Recent Labs  Lab 04/23/22 0816 04/23/22 1205 04/23/22 1648 04/23/22 2351 04/24/22 0831  GLUCAP 125* 125* 137* 107* 122*   Cardiac Enzymes: Recent Labs  Lab 04/20/22 1025  CKTOTAL 104   No results for input(s): "PROBNP" in the last 8760  hours. Coagulation Profile: Recent Labs  Lab 04/20/22 1025 04/20/22 1300 04/20/22 1855 04/21/22 0547  INR >10.0* >10.0* 1.9* 1.4*   Thyroid Function Tests: No results for input(s): "TSH", "T4TOTAL", "FREET4", "T3FREE", "THYROIDAB" in the last 72 hours. Lipid Profile: No results for input(s): "CHOL", "HDL", "LDLCALC", "TRIG", "CHOLHDL", "LDLDIRECT" in the last 72 hours. Anemia Panel: Recent Labs    04/23/22 1113  VITAMINB12 2,782*  FOLATE 20.5  FERRITIN 175  TIBC 214*  IRON 51  RETICCTPCT 3.3*   Urine analysis:    Component Value Date/Time   COLORURINE AMBER (A) 04/18/2022 2244   APPEARANCEUR HAZY (A) 04/18/2022 2244   LABSPEC 1.011 04/18/2022 2244   PHURINE 5.0 04/18/2022 2244   GLUCOSEU NEGATIVE 04/18/2022 2244   GLUCOSEU NEGATIVE 09/07/2020 1244   HGBUR MODERATE (A) 04/18/2022 2244   BILIRUBINUR NEGATIVE 04/18/2022 2244   KETONESUR NEGATIVE 04/18/2022 2244   PROTEINUR 30 (A) 04/18/2022 2244   UROBILINOGEN 0.2 09/07/2020 1244   NITRITE NEGATIVE 04/18/2022 2244   LEUKOCYTESUR MODERATE (A) 04/18/2022 2244   Sepsis Labs: Invalid input(s): "PROCALCITONIN", "LACTICIDVEN"   SIGNED:  Mercy Riding, MD  [facility] 04/24/2022, 10:54 AM

## 2022-04-24 NOTE — TOC Progression Note (Signed)
Transition of Care [facility]) - Progression Note    Patient Details  Name: Jessica [name] MRN: [id number] Date of Birth: 12/25/1933  Transition of Care [facility]) CM/SW [place], LCSW Phone Number: 04/24/2022, 9:04 AM  Clinical Narrative:    Insurance approval received for [employer]: Ref# [id number] ID# [id number], effective 04/22/2022-04/25/2022. CSW updated Charna Archer and patient's niece, [name]. [name] asked about completing HCPOA paperwork as patient has indicated that she wants her to do it and not patient's daughter. CSW explained that patient must be Ox4 to be able to sign any paperwork and she is not at this time. She reported understanding and hopes that patient will be able to return home soon. Will request PTAR for transport.      Expected Discharge Plan: [place] Barriers to Discharge: Barriers Resolved  Expected Discharge Plan and Services In-house Referral: Clinical Social Work   Post Acute Care Choice: [place] Living arrangements for the past 2 months: [place] Expected Discharge Date: 04/24/22                                     Social Determinants of Health (SDOH) Interventions SDOH Screenings   Food Insecurity: No Food Insecurity (03/30/2022)  Housing: Low Risk  (03/30/2022)  Transportation Needs: No Transportation Needs (03/30/2022)  Utilities: Not At Risk (03/30/2022)  Alcohol Screen: Low Risk  (07/01/2021)  Depression (PHQ2-9): Low Risk  (07/01/2021)  Financial Resource Strain: Low Risk  (07/01/2021)  Physical Activity: Inactive (07/01/2021)  Social Connections: Socially Isolated (07/01/2021)  Stress: No Stress Concern Present (07/01/2021)  Tobacco Use: Low Risk  (04/18/2022)    Readmission Risk Interventions    04/21/2022    9:30 AM  Readmission Risk Prevention Plan  Transportation Screening Complete  Medication Review (RN Care Manager) Complete  PCP or Specialist appointment within 3-5  days of discharge Complete  HRI or [place] Complete  SW Recovery Care/Counseling Consult Complete  Palliative Care Screening Not [place] Complete

## 2022-04-24 NOTE — Progress Notes (Signed)
   04/24/22 1050  Spiritual Encounters  Conversation partners present during Materials engineer Estill Bamberg, RN)  Regulatory affairs officer (For Healthcare)  Does Patient Have a Medical Advance Directive? Unable to assess, patient is non-responsive or altered mental status  Would patient like information on creating a medical advance directive?  (Spoke with Estill Bamberg, RN patient is unable to sign HCPOA)   Chaplain Jorene Guest responded to the consult request for an Advance Directive. This chaplain spoke with Estill Bamberg, RN who confirmed the patient is unable to sign the [facility] document. This note was prepared by Jeanine Luz, M.Div..  For questions please contact by phone [phone].

## 2022-04-25 ENCOUNTER — Emergency Department (HOSPITAL_COMMUNITY)
Admission: EM | Admit: 2022-04-25 | Discharge: 2022-04-25 | Disposition: A | Discharge status: Skilled Nursing Facility | Payer: Medicare Other | Attending: Emergency Medicine | Admitting: Emergency Medicine

## 2022-04-25 ENCOUNTER — Other Ambulatory Visit: Payer: Self-pay

## 2022-04-25 ENCOUNTER — Emergency Department (HOSPITAL_COMMUNITY): Payer: Medicare Other

## 2022-04-25 DIAGNOSIS — I4891 Unspecified atrial fibrillation: Secondary | ICD-10-CM | POA: Diagnosis not present

## 2022-04-25 DIAGNOSIS — R110 Nausea: Secondary | ICD-10-CM | POA: Diagnosis not present

## 2022-04-25 DIAGNOSIS — E039 Hypothyroidism, unspecified: Secondary | ICD-10-CM | POA: Diagnosis not present

## 2022-04-25 DIAGNOSIS — Z8616 Personal history of COVID-19: Secondary | ICD-10-CM | POA: Insufficient documentation

## 2022-04-25 DIAGNOSIS — R1013 Epigastric pain: Secondary | ICD-10-CM | POA: Diagnosis not present

## 2022-04-25 DIAGNOSIS — Z950 Presence of cardiac pacemaker: Secondary | ICD-10-CM | POA: Insufficient documentation

## 2022-04-25 DIAGNOSIS — Z79899 Other long term (current) drug therapy: Secondary | ICD-10-CM | POA: Diagnosis not present

## 2022-04-25 DIAGNOSIS — R109 Unspecified abdominal pain: Secondary | ICD-10-CM | POA: Diagnosis not present

## 2022-04-25 DIAGNOSIS — Z7984 Long term (current) use of oral hypoglycemic drugs: Secondary | ICD-10-CM | POA: Insufficient documentation

## 2022-04-25 DIAGNOSIS — R0789 Other chest pain: Secondary | ICD-10-CM | POA: Diagnosis not present

## 2022-04-25 DIAGNOSIS — Z743 Need for continuous supervision: Secondary | ICD-10-CM | POA: Diagnosis not present

## 2022-04-25 DIAGNOSIS — I509 Heart failure, unspecified: Secondary | ICD-10-CM | POA: Insufficient documentation

## 2022-04-25 DIAGNOSIS — E119 Type 2 diabetes mellitus without complications: Secondary | ICD-10-CM | POA: Diagnosis not present

## 2022-04-25 DIAGNOSIS — R079 Chest pain, unspecified: Secondary | ICD-10-CM | POA: Diagnosis not present

## 2022-04-25 DIAGNOSIS — J449 Chronic obstructive pulmonary disease, unspecified: Secondary | ICD-10-CM | POA: Diagnosis not present

## 2022-04-25 DIAGNOSIS — Z7901 Long term (current) use of anticoagulants: Secondary | ICD-10-CM | POA: Diagnosis not present

## 2022-04-25 DIAGNOSIS — I110 Hypertensive heart disease with heart failure: Secondary | ICD-10-CM | POA: Diagnosis not present

## 2022-04-25 LAB — CBC WITH DIFFERENTIAL/PLATELET
Abs Immature Granulocytes: 0.2 10*3/uL — ABNORMAL HIGH (ref 0.00–0.07)
Basophils Absolute: 0 10*3/uL (ref 0.0–0.1)
Basophils Relative: 0 %
Eosinophils Absolute: 0 10*3/uL (ref 0.0–0.5)
Eosinophils Relative: 0 %
HCT: 27.3 % — ABNORMAL LOW (ref 36.0–46.0)
Hemoglobin: 8.7 g/dL — ABNORMAL LOW (ref 12.0–15.0)
Lymphocytes Relative: 12 %
Lymphs Abs: 1 10*3/uL (ref 0.7–4.0)
MCH: 32.1 pg (ref 26.0–34.0)
MCHC: 31.9 g/dL (ref 30.0–36.0)
MCV: 100.7 fL — ABNORMAL HIGH (ref 80.0–100.0)
Metamyelocytes Relative: 1 %
Monocytes Absolute: 1.1 10*3/uL — ABNORMAL HIGH (ref 0.1–1.0)
Monocytes Relative: 13 %
Myelocytes: 1 %
Neutro Abs: 6.4 10*3/uL (ref 1.7–7.7)
Neutrophils Relative %: 73 %
Platelets: 326 10*3/uL (ref 150–400)
RBC: 2.71 MIL/uL — ABNORMAL LOW (ref 3.87–5.11)
RDW: 16.9 % — ABNORMAL HIGH (ref 11.5–15.5)
WBC: 8.7 10*3/uL (ref 4.0–10.5)
nRBC: 0 % (ref 0.0–0.2)

## 2022-04-25 LAB — CBG MONITORING, ED: Glucose-Capillary: 107 mg/dL — ABNORMAL HIGH (ref 70–99)

## 2022-04-25 LAB — TROPONIN I (HIGH SENSITIVITY)
Troponin I (High Sensitivity): 28 ng/L — ABNORMAL HIGH (ref <18)
Troponin I (High Sensitivity): 27 ng/L — ABNORMAL HIGH (ref <18)

## 2022-04-25 LAB — COMPREHENSIVE METABOLIC PANEL
ALT: <5 U/L (ref 0–44)
AST: 25 U/L (ref 15–41)
Albumin: 2.1 g/dL — ABNORMAL LOW (ref 3.5–5.0)
Alkaline Phosphatase: 58 U/L (ref 38–126)
Anion gap: 10 (ref 5–15)
BUN: 15 mg/dL (ref 8–23)
CO2: 22 mmol/L (ref 22–32)
Calcium: 8.1 mg/dL — ABNORMAL LOW (ref 8.9–10.3)
Chloride: 104 mmol/L (ref 98–111)
Creatinine, Ser: 0.9 mg/dL (ref 0.44–1.00)
GFR, Estimated: >60 mL/min (ref >60)
Glucose, Bld: 94 mg/dL (ref 70–99)
Potassium: 4.1 mmol/L (ref 3.5–5.1)
Sodium: 136 mmol/L (ref 135–145)
Total Bilirubin: 0.7 mg/dL (ref 0.3–1.2)
Total Protein: 7.3 g/dL (ref 6.5–8.1)

## 2022-04-25 LAB — MAGNESIUM: Magnesium: 1.4 mg/dL — ABNORMAL LOW (ref 1.7–2.4)

## 2022-04-25 LAB — PROTIME-INR
INR: 3.9 — ABNORMAL HIGH (ref 0.8–1.2)
Prothrombin Time: 37.9 seconds — ABNORMAL HIGH (ref 11.4–15.2)

## 2022-04-25 LAB — LIPASE, BLOOD: Lipase: 34 U/L (ref 11–51)

## 2022-04-25 MED ORDER — ALUM & MAG HYDROXIDE-SIMETH 200-200-20 MG/5ML PO SUSP
30.0000 mL | Freq: Once | ORAL | Status: AC
  Administered 2022-04-25: 30 mL via ORAL
  Filled 2022-04-25: qty 30

## 2022-04-25 MED ORDER — LIDOCAINE VISCOUS HCL 2 % MT SOLN
15.0000 mL | Freq: Once | OROMUCOSAL | Status: AC
  Administered 2022-04-25: 15 mL via ORAL
  Filled 2022-04-25: qty 15

## 2022-04-25 MED ORDER — MAGNESIUM SULFATE 2 GM/50ML IV SOLN
2.0000 g | Freq: Once | INTRAVENOUS | Status: AC
  Administered 2022-04-25: 2 g via INTRAVENOUS
  Filled 2022-04-25: qty 50

## 2022-04-25 MED ORDER — FAMOTIDINE 20 MG PO TABS: 20.0000 mg | ORAL_TABLET | Freq: Every day | ORAL | 0 refills | Status: DC

## 2022-04-25 MED ORDER — LORAZEPAM 1 MG PO TABS
0.5000 mg | ORAL_TABLET | Freq: Once | ORAL | Status: AC
  Administered 2022-04-25: 0.5 mg via ORAL
  Filled 2022-04-25: qty 1

## 2022-04-25 NOTE — Discharge Instructions (Addendum)
The testing done in the emergency department is reassuring.  A prescription for a medication that treats symptoms of acid reflux is attached.  Take this as prescribed.  If it improves her symptoms, talk to your primary care doctor about long-term use.  Return to emergency department for any new or worsening symptoms of concern.

## 2022-04-25 NOTE — ED Notes (Signed)
PTAR called; pt is #2 on list.

## 2022-04-25 NOTE — ED Provider Notes (Signed)
[facility] Provider Note   CSN: [id number] Arrival date & time: 04/25/22  1444     History  Chief Complaint  Patient presents with   Chest Pain    Jessica [name] is a 89 y.o. female.   Chest Pain Associated symptoms: abdominal pain, cough and nausea   Patient presents for chest/epigastric pain.  Medical history includes T2DM, hypothyroidism, HLD, obesity, HTN, arthritis, GERD, CHF, atrial fibrillation, TAVR, pacemaker placement, COPD.  Patient was hospitalized on Christmas for sepsis.  She required pressors and ICU admission.  Blood cultures grew MSSA.TEE showed vegetation on TAVR valve.  She was discharged on 1/7.  Plan was for Ancef and rifampin till mid February.  She was hospitalized again on 1/16 for AKI, hypotension, and COVID-19.  She was then discharged yesterday.  Today's discomfort occurred earlier in the day, although she is not able to state the time.  She does feel like she ate breakfast prior to symptoms.  It was gradual in onset.  Pain did not radiate.  She did not have any new associated symptoms.  She does state that she has chronic nausea from the ongoing antibiotics that she is receiving for bacteremia.  She also states that she has had an ongoing cough.  Pain has since resolved.  Currently, she denies any new symptoms.     Home Medications Prior to Admission medications   Medication Sig Start Date End Date Taking? Authorizing Provider  famotidine (PEPCID) 20 MG tablet Take 1 tablet (20 mg total) by mouth daily. 04/25/22 05/25/22 Yes Godfrey Pick, MD  acetaminophen (TYLENOL) 500 MG tablet Take 2 tablets (1,000 mg total) by mouth every 8 (eight) hours for 5 days, THEN 2 tablets (1,000 mg total) every 8 (eight) hours as needed for up to 25 days. 04/24/22 05/24/22  Mercy Riding, MD  albuterol (VENTOLIN HFA) 108 (90 Base) MCG/ACT inhaler Inhale 2 puffs into the lungs every 6 (six) hours as needed for wheezing or shortness of  breath. 04/24/22   Mercy Riding, MD  allopurinol (ZYLOPRIM) 100 MG tablet TAKE 1 TABLET BY MOUTH DAILY Patient taking differently: Take 100 mg by mouth daily. 02/13/22   Plotnikov, Evie Lacks, MD  apixaban (ELIQUIS) 5 MG TABS tablet Take 1 tablet (5 mg total) by mouth 2 (two) times daily. 04/26/22 07/25/22  Mercy Riding, MD  b complex vitamins tablet Take 1 tablet by mouth daily.    [provider]  bisacodyl (DULCOLAX) 5 MG EC tablet Take 5 mg by mouth daily as needed for mild constipation.    [provider]  Carboxymethylcellul-Glycerin (EQ LUBRICATING EYE DROPS OP) Place 1 drop into both eyes as needed (dry eyes).    [provider]  ceFAZolin (ANCEF) IVPB Inject 2 g into the vein every 8 (eight) hours for 20 days. Indication:  MSSA PVE First Dose: Yes Last Day of Therapy:  05/11/22 Labs - Once weekly:  CBC/D and CMP, Labs - Every other week:  ESR and CRP Method of administration: IV Push Pull PICC line at the completion of IV antibiotics Method of administration may be changed at the discretion of home infusion pharmacist based upon assessment of the patient and/or caregiver's ability to self-administer the medication ordered. 04/24/22 05/14/22  Mercy Riding, MD  Cholecalciferol 1000 UNITS tablet Take 1,000 Units by mouth daily.    [provider]  docusate sodium (COLACE) 100 MG capsule Take 100 mg by mouth daily as needed  for mild constipation or moderate constipation.    [provider]  ezetimibe (ZETIA) 10 MG tablet TAKE 1 TABLET BY MOUTH DAILY 02/13/22   Vickie Epley, MD  furosemide (LASIX) 40 MG tablet Take 1 tablet (40 mg total) by mouth daily as needed for fluid or edema. 04/24/22   Mercy Riding, MD  Heparin Sod, Pork, Lock Flush (HEPARIN LOCK FLUSH IV) Inject 10 mLs into the vein 3 (three) times daily. 04/12/22 05/11/22  [provider]  levothyroxine (SYNTHROID) 75 MCG tablet TAKE 1 TABLET BY MOUTH DAILY Patient taking  differently: Take 75 mcg by mouth daily. 02/13/22   Plotnikov, Evie Lacks, MD  LORazepam (ATIVAN) 0.5 MG tablet Take 1 tablet (0.5 mg total) by mouth every 8 (eight) hours as needed for up to 5 days. for anxiety 04/24/22 04/29/22  Mercy Riding, MD  metFORMIN (GLUCOPHAGE) 500 MG tablet TAKE 1 TABLET BY MOUTH TWICE  DAILY WITH MEALS 02/13/22   Plotnikov, Evie Lacks, MD  metoprolol tartrate (LOPRESSOR) 25 MG tablet Take 1 tablet (25 mg total) by mouth 2 (two) times daily. 04/09/22 07/08/22  Kayleen Memos, DO  Multiple Vitamin (MULTIVITAMIN) tablet Take 1 tablet by mouth daily.    [provider]  nystatin powder Apply 1 Application topically daily. Apply to under breast topically every day shift for redness/moisture.    [provider]  OXYGEN Inhale 2 L into the lungs 3 (three) times daily.    [provider]  polyvinyl alcohol (LIQUIFILM TEARS) 1.4 % ophthalmic solution 1 drop every 8 (eight) hours as needed for dry eyes.    [provider]  repaglinide (PRANDIN) 2 MG tablet TAKE 2 TABLETS BY MOUTH 3  TIMES DAILY BEFORE MEALS Patient taking differently: Take 2 mg by mouth 3 (three) times daily before meals. 11/07/21   Plotnikov, Evie Lacks, MD  rosuvastatin (CRESTOR) 10 MG tablet TAKE 1 TABLET BY MOUTH UP TO 3  TIMES WEEKLY AS TOLERATED Patient taking differently: Take 10 mg by mouth every Monday, Wednesday, and Friday. 11/14/21   Skeet Latch, MD  saccharomyces boulardii (FLORASTOR) 250 MG capsule Take 250 mg by mouth 2 (two) times daily.    [provider]  sacubitril-valsartan (ENTRESTO) 97-103 MG Take 1 tablet by mouth 2 (two) times daily. 05/01/22   Mercy Riding, MD  senna (SENOKOT) 8.6 MG TABS tablet Take 1 tablet by mouth daily as needed for mild constipation.    [provider]  sodium chloride flush (NS) 0.9 % SOLN Inject 10 mLs into the vein 3 (three) times daily. Until 02.08.2024    [provider]  temazepam (RESTORIL) 7.5 MG  capsule Take 7.5 mg by mouth at bedtime. 04/10/22   [provider]  venlafaxine XR (EFFEXOR-XR) 75 MG 24 hr capsule TAKE 1 CAPSULE BY MOUTH  DAILY WITH BREAKFAST Patient taking differently: Take 75 mg by mouth daily. 05/02/21   Plotnikov, Evie Lacks, MD      Allergies    Coreg [carvedilol], Relafen [nabumetone], Calcium, Codeine, Lipitor [atorvastatin], Pacerone [amiodarone], Vasotec [enalapril], Vioxx [rofecoxib], and Zocor [simvastatin]    Review of Systems   Review of Systems  Respiratory:  Positive for cough.   Cardiovascular:  Positive for chest pain.  Gastrointestinal:  Positive for abdominal pain and nausea.  All other systems reviewed and are negative.   Physical Exam Updated Vital Signs BP (!) 124/59   Pulse (!) 103   Temp 98.6 F (37 C) (Oral)   Resp 18  Ht '5\' 2"'$  (1.575 m)   Wt 111.1 kg   SpO2 100%   BMI 44.81 kg/m  Physical Exam Vitals and nursing note reviewed.  Constitutional:      General: She is not in acute distress.    Appearance: She is well-developed. She is ill-appearing (Chronically). She is not toxic-appearing or diaphoretic.  HENT:     Head: Normocephalic and atraumatic.  Eyes:     Conjunctiva/sclera: Conjunctivae normal.  Cardiovascular:     Rate and Rhythm: Normal rate and regular rhythm.     Heart sounds: No murmur heard. Pulmonary:     Effort: Pulmonary effort is normal. No tachypnea, accessory muscle usage or respiratory distress.     Breath sounds: Rales present. No decreased breath sounds, wheezing or rhonchi.  Chest:     Chest wall: No tenderness.  Abdominal:     Palpations: Abdomen is soft.     Tenderness: There is no abdominal tenderness.  Musculoskeletal:        General: No swelling.     Cervical back: Normal range of motion and neck supple.     Right lower leg: No edema.     Left lower leg: No edema.  Skin:    General: Skin is warm and dry.     Coloration: Skin is not cyanotic or pale.  Neurological:     General: No  focal deficit present.     Mental Status: She is alert and oriented to person, place, and time.  Psychiatric:        Mood and Affect: Mood normal.        Behavior: Behavior normal.     ED Results / Procedures / Treatments   Labs (all labs ordered are listed, but only abnormal results are displayed) Labs Reviewed  COMPREHENSIVE METABOLIC PANEL - Abnormal; Notable for the following components:      Result Value   Calcium 8.1 (*)    Albumin 2.1 (*)    All other components within normal limits  MAGNESIUM - Abnormal; Notable for the following components:   Magnesium 1.4 (*)    All other components within normal limits  CBC WITH DIFFERENTIAL/PLATELET - Abnormal; Notable for the following components:   RBC 2.71 (*)    Hemoglobin 8.7 (*)    HCT 27.3 (*)    MCV 100.7 (*)    RDW 16.9 (*)    Monocytes Absolute 1.1 (*)    Abs Immature Granulocytes 0.20 (*)    All other components within normal limits  PROTIME-INR - Abnormal; Notable for the following components:   Prothrombin Time 37.9 (*)    INR 3.9 (*)    All other components within normal limits  CBG MONITORING, ED - Abnormal; Notable for the following components:   Glucose-Capillary 107 (*)    All other components within normal limits  TROPONIN I (HIGH SENSITIVITY) - Abnormal; Notable for the following components:   Troponin I (High Sensitivity) 28 (*)    All other components within normal limits  TROPONIN I (HIGH SENSITIVITY) - Abnormal; Notable for the following components:   Troponin I (High Sensitivity) 27 (*)    All other components within normal limits  LIPASE, BLOOD    EKG EKG Interpretation  Date/Time:  Tuesday April 25 2022 15:19:54 EST Ventricular Rate:  94 PR Interval:  180 QRS Duration: 170 QT Interval:  436 QTC Calculation: 545 R Axis:   -71 Text Interpretation: Atrial-sensed ventricular-paced rhythm Confirmed by Godfrey Pick ([id number]) on 04/25/2022 5:56:21 PM  Radiology DG Chest 1 View  Result Date:  04/25/2022 CLINICAL DATA:  Chest pain. EXAM: CHEST  1 VIEW COMPARISON:  April 20, 2022. FINDINGS: Stable cardiomegaly. Left-sided pacemaker is unchanged in position. Increased bibasilar atelectasis or edema is noted with associated pleural effusions. Bony thorax is unremarkable. IMPRESSION: Increased bibasilar opacities as described above. Electronically Signed   By: Marijo Conception M.D.   On: 04/25/2022 16:15    Procedures Procedures    Medications Ordered in ED Medications  LORazepam (ATIVAN) tablet 0.5 mg (0.5 mg Oral Given 04/25/22 1905)  magnesium sulfate IVPB 2 g 50 mL (0 g Intravenous Stopped 04/25/22 2008)  alum & mag hydroxide-simeth (MAALOX/MYLANTA) 200-200-20 MG/5ML suspension 30 mL (30 mLs Oral Given 04/25/22 2217)    And  lidocaine (XYLOCAINE) 2 % viscous mouth solution 15 mL (15 mLs Oral Given 04/25/22 2218)    ED Course/ Medical Decision Making/ A&P                             Medical Decision Making Amount and/or Complexity of Data Reviewed Labs: ordered. Radiology: ordered.  Risk OTC drugs. Prescription drug management.   This patient presents to the ED for concern of epigastric pain, this involves an extensive number of treatment options, and is a complaint that carries with it a high risk of complications and morbidity.  The differential diagnosis includes GERD, gastritis, ACS, pancreatitis, cholecystitis   Co morbidities that complicate the patient evaluation  T2DM, hypothyroidism, HLD, obesity, HTN, arthritis, GERD, CHF, atrial fibrillation, TAVR, pacemaker placement, COPD   Additional history obtained:  Additional history obtained from N/A External records from outside source obtained and reviewed including EMR   Lab Tests:  I Ordered, and personally interpreted labs.  The pertinent results include: Normal kidney function, hypomagnesemia with otherwise normal electrolytes, baseline anemia, no leukocytosis.  Very slight elevation in troponin with stable  repeat.  Slightly supratherapeutic INR.   Imaging Studies ordered:  I ordered imaging studies including chest x-ray I independently visualized and interpreted imaging which showed mildly increased bibasilar opacities consistent with atelectasis I agree with the radiologist interpretation   Cardiac Monitoring: / EKG:  The patient was maintained on a cardiac monitor.  I personally viewed and interpreted the cardiac monitored which showed an underlying rhythm of: Paced rhythm  Problem List / ED Course / Critical interventions / Medication management  Patient presents for epigastric pain.  She describes this as starting earlier today with gradual onset.  Symptoms have resolved at time of arrival in the ED.  On exam, patient is overall well-appearing.  Breathing is unlabored.  Lungs clear to auscultation.  She has no epigastric or chest tenderness.  PICC line is in place in right arm for ongoing IV treatment of bacteremia.  EKG shows ventricular paced rhythm.  Laboratory workup was initiated.  While in the ED, patient had agitation.  This is consistent with prior episodes of hospital delirium.  Small dose of Ativan was given.  Patient's lab work is reassuring.  Magnesium was low and replacement was ordered.  GI cocktail was ordered for empiric treatment of gastritis/GERD.  She was prescribed Pepcid.  She was advised to follow-up with her primary doctor.  She was discharged in stable condition. I ordered medication including Ativan for agitation; GI cocktail for empiric treatment of gastritis Reevaluation of the patient after these medicines showed that the patient improved I have reviewed the patients home medicines and have made  adjustments as needed   Social Determinants of Health:  Resides in skilled nursing facility; discharge from hospital yesterday         Final Clinical Impression(s) / ED Diagnoses Final diagnoses:  Epigastric pain    Rx / DC Orders ED Discharge Orders           Ordered    famotidine (PEPCID) 20 MG tablet  Daily        04/25/22 2203              Godfrey Pick, MD 04/25/22 2248

## 2022-04-25 NOTE — ED Triage Notes (Signed)
Pt bib [facility], [place] Chest pain through out the day with nausea throught out the day. Center of the chest. Complains of shortness of breath.  96% on 2L 126/74 Rate of 90 Alert and oriented x4

## 2022-04-27 DIAGNOSIS — B9561 Methicillin susceptible Staphylococcus aureus infection as the cause of diseases classified elsewhere: Secondary | ICD-10-CM | POA: Diagnosis not present

## 2022-04-27 DIAGNOSIS — E119 Type 2 diabetes mellitus without complications: Secondary | ICD-10-CM | POA: Diagnosis not present

## 2022-04-27 DIAGNOSIS — I10 Essential (primary) hypertension: Secondary | ICD-10-CM | POA: Diagnosis not present

## 2022-04-27 DIAGNOSIS — I4891 Unspecified atrial fibrillation: Secondary | ICD-10-CM | POA: Diagnosis not present

## 2022-04-27 DIAGNOSIS — R7881 Bacteremia: Secondary | ICD-10-CM | POA: Diagnosis not present

## 2022-04-27 DIAGNOSIS — T827XXA Infection and inflammatory reaction due to other cardiac and vascular devices, implants and grafts, initial encounter: Secondary | ICD-10-CM | POA: Diagnosis not present

## 2022-04-27 DIAGNOSIS — I38 Endocarditis, valve unspecified: Secondary | ICD-10-CM | POA: Diagnosis not present

## 2022-04-28 ENCOUNTER — Telehealth: Payer: Self-pay | Admitting: *Deleted

## 2022-04-28 DIAGNOSIS — D649 Anemia, unspecified: Secondary | ICD-10-CM | POA: Diagnosis not present

## 2022-04-28 DIAGNOSIS — N179 Acute kidney failure, unspecified: Secondary | ICD-10-CM | POA: Diagnosis not present

## 2022-04-28 DIAGNOSIS — T826XXD Infection and inflammatory reaction due to cardiac valve prosthesis, subsequent encounter: Secondary | ICD-10-CM | POA: Diagnosis not present

## 2022-04-28 NOTE — Telephone Encounter (Signed)
        Patient  visited [facility] on 04/25/2022  for treatment    Telephone encounter attempt :  1st  A HIPAA compliant voice message was left requesting a return call.  Instructed patient to call back at [phone].  [facility] [phone] [address] , [place] [address] Email : [email or web address]

## 2022-05-01 DIAGNOSIS — W19XXXA Unspecified fall, initial encounter: Secondary | ICD-10-CM | POA: Diagnosis not present

## 2022-05-01 DIAGNOSIS — G4700 Insomnia, unspecified: Secondary | ICD-10-CM | POA: Diagnosis not present

## 2022-05-02 DIAGNOSIS — E1165 Type 2 diabetes mellitus with hyperglycemia: Secondary | ICD-10-CM | POA: Diagnosis not present

## 2022-05-04 DIAGNOSIS — I4891 Unspecified atrial fibrillation: Secondary | ICD-10-CM | POA: Diagnosis not present

## 2022-05-04 DIAGNOSIS — B9561 Methicillin susceptible Staphylococcus aureus infection as the cause of diseases classified elsewhere: Secondary | ICD-10-CM | POA: Diagnosis not present

## 2022-05-04 DIAGNOSIS — D62 Acute posthemorrhagic anemia: Secondary | ICD-10-CM | POA: Diagnosis not present

## 2022-05-04 DIAGNOSIS — I10 Essential (primary) hypertension: Secondary | ICD-10-CM | POA: Diagnosis not present

## 2022-05-04 DIAGNOSIS — E119 Type 2 diabetes mellitus without complications: Secondary | ICD-10-CM | POA: Diagnosis not present

## 2022-05-04 DIAGNOSIS — I509 Heart failure, unspecified: Secondary | ICD-10-CM | POA: Diagnosis not present

## 2022-05-04 DIAGNOSIS — E039 Hypothyroidism, unspecified: Secondary | ICD-10-CM | POA: Diagnosis not present

## 2022-05-04 DIAGNOSIS — J449 Chronic obstructive pulmonary disease, unspecified: Secondary | ICD-10-CM | POA: Diagnosis not present

## 2022-05-08 DIAGNOSIS — E785 Hyperlipidemia, unspecified: Secondary | ICD-10-CM | POA: Diagnosis not present

## 2022-05-08 DIAGNOSIS — I2510 Atherosclerotic heart disease of native coronary artery without angina pectoris: Secondary | ICD-10-CM | POA: Diagnosis not present

## 2022-05-08 DIAGNOSIS — D649 Anemia, unspecified: Secondary | ICD-10-CM | POA: Diagnosis not present

## 2022-05-09 ENCOUNTER — Other Ambulatory Visit: Payer: Self-pay

## 2022-05-09 ENCOUNTER — Ambulatory Visit (INDEPENDENT_AMBULATORY_CARE_PROVIDER_SITE_OTHER): Payer: Medicare Other | Admitting: Internal Medicine

## 2022-05-09 ENCOUNTER — Encounter: Payer: Self-pay | Admitting: Internal Medicine

## 2022-05-09 VITALS — BP 117/72 | HR 78 | Temp 97.9°F

## 2022-05-09 DIAGNOSIS — I330 Acute and subacute infective endocarditis: Secondary | ICD-10-CM | POA: Diagnosis not present

## 2022-05-09 DIAGNOSIS — Z950 Presence of cardiac pacemaker: Secondary | ICD-10-CM | POA: Diagnosis not present

## 2022-05-09 NOTE — Progress Notes (Signed)
[facility]  Patient Active Problem List   Diagnosis Date Noted   ABLA (acute blood loss anemia) 04/20/2022   Elevated INR 04/20/2022   Prolonged QT interval 04/20/2022   Delirium 04/19/2022   History of bacteremia 04/18/2022   COVID-19 virus infection 04/18/2022   COPD with acute exacerbation ([place]) 04/18/2022   Dementia with behavioral disturbance ([place]) 04/18/2022   MSSA bacteremia 04/04/2022   Prosthetic valve endocarditis ([place]) 04/04/2022   Pacemaker infection ([place]) 04/04/2022   Sepsis ([place]) 03/28/2022   Altered mental status 03/28/2022   Hypovolemia 03/28/2022   AKI (acute kidney injury) ([place]) 03/28/2022   Severe sepsis with acute organ dysfunction ([place]) 03/28/2022   DNR (do not resuscitate) 03/28/2022   Hypotension 03/28/2022   Rectal bleeding    Pure hypercholesterolemia 01/28/2021   Fall 12/22/2020   Knee contusion 12/22/2020   Eczema 09/09/2019   Vertigo 03/05/2019   S/P TAVR (transcatheter aortic valve replacement) 01/14/2019   Presence of permanent cardiac pacemaker    Pulmonary nodule    Severe aortic stenosis    Atrial fibrillation and flutter ([place]) 07/05/2017   Acute on chronic diastolic CHF (congestive heart failure) (HCC)    DOE (dyspnea on exertion) 04/18/2017   Chronic venous insufficiency 12/08/2016   Erythema nodosum 11/10/2016   Paronychia of finger, left 04/13/2016   Left carotid artery stenosis 09/24/2014   Diarrhea 07/12/2013   Foot pain, left 03/18/2013   Gastrocnemius strain 03/18/2013   GERD (gastroesophageal reflux disease) 08/18/2011   History of pulmonary embolism 10/2008   DUODENITIS, WITHOUT HEMORRHAGE 06/29/2008   Diverticulosis of colon without hemorrhage 06/29/2008   DM type 2, controlled, with complication ([place]) 38/25/0539   Low back pain 10/15/2007   OBESITY, MORBID 07/16/2007   Grieving 05/15/2007   HLD (hyperlipidemia) 03/03/2007   Hypothyroidism 11/06/2006   Anxiety disorder 11/06/2006    CARPAL TUNNEL SYNDROME 11/06/2006   Essential hypertension 11/06/2006   HIATAL HERNIA 11/06/2006   Osteoarthritis 11/06/2006   History of endocarditis 11/06/2006      Subjective:    Patient ID: Jessica [name], female    DOB: 11/17/1933, 89 y.o.   MRN: [id number]  Chief Complaint  Patient presents with   Hospitalization Follow-up    HPI:  Jessica [name] is a 89 y.o. female here for hospital f/u tavr/pacer infection and mssa bacteremia  Patient is supposed to be on 6 weeks cefazolin until 2/8 and transitioned to cefadroxil indefinitely She was initially seen 03/29/22 by id D[place] Dates no vegetation on pacer but the tavr has vegetation Ct surgery evaluated and not surgical candidate She was put on rifampin together with cefazolin but couldn't tolerate rifampin due to severe nausea  She was sick with covid and had admission 04/20/22 when rifampin was stopped  05/09/22 id clinic She is here with her niece She is here for initial id clinic f/u She is still on 2 liters of oxygen. Patient is in a skilled nursing home She remains weak and she is not able to work as much as she wants with pt/ot. She is walking a few feet with FWW and help No issue with rue picc site No joint pain/back pain No diarrhea or rash Nausea and appetite better since rifampin stopped. There are days that she don't even have nausea. 2-3/10 at worse about 3 times a week    Allergies  Allergen Reactions   Coreg [Carvedilol] Other (See Comments)    Weak legs   Relafen [  Nabumetone] Other (See Comments)    Upset stomach   Calcium Other (See Comments)    Unknown reaction   Codeine Other (See Comments)    Unknown reaction   Lipitor [Atorvastatin] Other (See Comments)    Myalgias   Pacerone [Amiodarone] Other (See Comments)    Hand tremors   Vasotec [Enalapril] Cough   Vioxx [Rofecoxib] Other (See Comments)    Unknown reaction   Zocor [Simvastatin] Other (See Comments)    Myalgias      Outpatient  Medications Prior to Visit  Medication Sig Dispense Refill   acetaminophen (TYLENOL) 500 MG tablet Take 2 tablets (1,000 mg total) by mouth every 8 (eight) hours for 5 days, THEN 2 tablets (1,000 mg total) every 8 (eight) hours as needed for up to 25 days. 30 tablet 0   albuterol (VENTOLIN HFA) 108 (90 Base) MCG/ACT inhaler Inhale 2 puffs into the lungs every 6 (six) hours as needed for wheezing or shortness of breath. 8 g 2   allopurinol (ZYLOPRIM) 100 MG tablet TAKE 1 TABLET BY MOUTH DAILY (Patient taking differently: Take 100 mg by mouth daily.) 100 tablet 2   apixaban (ELIQUIS) 5 MG TABS tablet Take 1 tablet (5 mg total) by mouth 2 (two) times daily. 180 tablet 0   b complex vitamins tablet Take 1 tablet by mouth daily.     bisacodyl (DULCOLAX) 5 MG EC tablet Take 5 mg by mouth daily as needed for mild constipation.     Carboxymethylcellul-Glycerin (EQ LUBRICATING EYE DROPS OP) Place 1 drop into both eyes as needed (dry eyes).     ceFAZolin (ANCEF) IVPB Inject 2 g into the vein every 8 (eight) hours for 20 days. Indication:  MSSA PVE First Dose: Yes Last Day of Therapy:  05/11/22 Labs - Once weekly:  CBC/D and CMP, Labs - Every other week:  ESR and CRP Method of administration: IV Push Pull PICC line at the completion of IV antibiotics Method of administration may be changed at the discretion of home infusion pharmacist based upon assessment of the patient and/or caregiver's ability to self-administer the medication ordered. 60 Units 0   Cholecalciferol 1000 UNITS tablet Take 1,000 Units by mouth daily.     docusate sodium (COLACE) 100 MG capsule Take 100 mg by mouth daily as needed for mild constipation or moderate constipation.     ezetimibe (ZETIA) 10 MG tablet TAKE 1 TABLET BY MOUTH DAILY 90 tablet 2   famotidine (PEPCID) 20 MG tablet Take 1 tablet (20 mg total) by mouth daily. 30 tablet 0   furosemide (LASIX) 40 MG tablet Take 1 tablet (40 mg total) by mouth daily as needed for fluid or  edema. 200 tablet 2   Heparin Sod, Pork, Lock Flush (HEPARIN LOCK FLUSH IV) Inject 10 mLs into the vein 3 (three) times daily.     levothyroxine (SYNTHROID) 75 MCG tablet TAKE 1 TABLET BY MOUTH DAILY (Patient taking differently: Take 75 mcg by mouth daily.) 100 tablet 2   metFORMIN (GLUCOPHAGE) 500 MG tablet TAKE 1 TABLET BY MOUTH TWICE  DAILY WITH MEALS 200 tablet 2   metoprolol tartrate (LOPRESSOR) 25 MG tablet Take 1 tablet (25 mg total) by mouth 2 (two) times daily. 180 tablet 0   Multiple Vitamin (MULTIVITAMIN) tablet Take 1 tablet by mouth daily.     nystatin powder Apply 1 Application topically daily. Apply to under breast topically every day shift for redness/moisture.     OXYGEN Inhale 2 L into the lungs  3 (three) times daily.     polyvinyl alcohol (LIQUIFILM TEARS) 1.4 % ophthalmic solution 1 drop every 8 (eight) hours as needed for dry eyes.     repaglinide (PRANDIN) 2 MG tablet TAKE 2 TABLETS BY MOUTH 3  TIMES DAILY BEFORE MEALS (Patient taking differently: Take 2 mg by mouth 3 (three) times daily before meals.) 540 tablet 3   rosuvastatin (CRESTOR) 10 MG tablet TAKE 1 TABLET BY MOUTH UP TO 3  TIMES WEEKLY AS TOLERATED (Patient taking differently: Take 10 mg by mouth every Monday, 07-11-22, and Friday.) 43 tablet 2   saccharomyces boulardii (FLORASTOR) 250 MG capsule Take 250 mg by mouth 2 (two) times daily.     sacubitril-valsartan (ENTRESTO) 97-103 MG Take 1 tablet by mouth 2 (two) times daily. 180 tablet 3   senna (SENOKOT) 8.6 MG TABS tablet Take 1 tablet by mouth daily as needed for mild constipation.     sodium chloride flush (NS) 0.9 % SOLN Inject 10 mLs into the vein 3 (three) times daily. Until 02.08.2024     temazepam (RESTORIL) 7.5 MG capsule Take 7.5 mg by mouth at bedtime.     venlafaxine XR (EFFEXOR-XR) 75 MG 24 hr capsule TAKE 1 CAPSULE BY MOUTH  DAILY WITH BREAKFAST (Patient taking differently: Take 75 mg by mouth daily.) 90 capsule 3   No facility-administered  medications prior to visit.     Social History   Socioeconomic History   Marital status: Widowed    Spouse name: Not on file   Number of children: 3   Years of education: Not on file   Highest education level: Not on file  Occupational History   Not on file  Tobacco Use   Smoking status: Never   Smokeless tobacco: Never  Vaping Use   Vaping Use: Never used  Substance and Sexual Activity   Alcohol use: No    Alcohol/week: 0.0 standard drinks of alcohol   Drug use: No   Sexual activity: Not Currently  Other Topics Concern   Not on file  Social History Narrative   Opth - Dr Leonie Man   Retired, Looking after great-grand baby; lives w/son   Daily Caffeine Use - 1   Widow - suicide September 21, 2008; 2 daughters died      lost brother and son 07/10/12; spouse died 07-10-2008   Have 4 children; 3 have expired,  now has one; dtr; copes by reading    Father had stroke at 10 and mother had heart disease; MI at 23  but lived to be 54.    Son died quickly of lung cancer   [place]; the oldest had aneurysm   Youngest dtr 48 and died of MI   Twin sister dx with Alzheimer's today/    Will give information regarding Alz resources   Social Determinants of Health   Financial Resource Strain: Low Risk  (07/01/2021)   Overall Financial Resource Strain (CARDIA)    Difficulty of Paying Living Expenses: Not hard at all  Food Insecurity: No Food Insecurity (03/30/2022)   Hunger Vital Sign    Worried About Running Out of Food in the Last Year: Never true    Ran Out of Food in the Last Year: Never true  Transportation Needs: No Transportation Needs (03/30/2022)   PRAPARE - Hydrologist (Medical): No    Lack of Transportation (Non-Medical): No  Physical Activity: Inactive (07/01/2021)   Exercise Vital Sign    Days of Exercise per Week: 0  days    Minutes of Exercise per Session: 0 min  Stress: No Stress Concern Present (07/01/2021)   [place]    Feeling of Stress : Not at all  Social Connections: Socially Isolated (07/01/2021)   Social Connection and Isolation Panel [NHANES]    Frequency of Communication with Friends and Family: Twice a week    Frequency of Social Gatherings with Friends and Family: Twice a week    Attends Religious Services: Never    Marine scientist or Organizations: No    Attends Archivist Meetings: Never    Marital Status: Widowed  Intimate Partner Violence: Not At Risk (03/30/2022)   Humiliation, Afraid, Rape, and Kick questionnaire    Fear of Current or Ex-Partner: No    Emotionally Abused: No    Physically Abused: No    Sexually Abused: No      Review of Systems    All other ros negative  Objective:    BP 117/72   Pulse 78   Temp 97.9 F (36.6 C) (Temporal)  Nursing note and vital signs reviewed.  Physical Exam     General/constitutional: no distress, pleasant, on 2 liters o2 via [place]; in wheel chair; conversant HEENT: Normocephalic, PER, Conj Clear, EOMI, Oropharynx clear Neck supple CV: rrr no mrg Lungs: clear to auscultation, normal respiratory effort Abd: Soft, Nontender Ext: no edema Skin: No Rash Neuro: nonfocal MSK: no peripheral joint swelling/tenderness/warmth; back spines nontender   Central line presence: rue picc site no erythema/purulence   Labs: 1/29 nursing home labs Cbc 6/9/355; cr 0.86; lft 31/10/71/0.3  Micro:  Serology:  Imaging:  Assessment & Plan:   Problem List Items Addressed This Visit       Other   Presence of permanent cardiac pacemaker   Other Visit Diagnoses     Bacterial endocarditis, unspecified chronicity    -  Primary         No orders of the defined types were placed in this encounter.    Abx: 03/29/22-2/6 Cefazolin  04/06/22-04/20/22 rifampin                                                   Assessment: Mssa bsi, tavr/pacer infection    Patient recent admission  for mssa process, d/c'ed to snf on opat for 6 weeks rifampin/cefaz and continued on indefinite cefadroxil, admitted 1/16 for ams/weakness, diarrhea, worsening nausea found to have covid infection   Recent admission w/u --> MSSA prosthetic valve endocarditis involving TAVR and presumed PPM involvement (though no observed vegetation on TEE).    In reviewing her nausea it seems she has had that since last admission and given aki on presentation, concern this is rather more severe than average. I suspect the rifampin is inducing this. This was started in setting her cardiac device unable to be extracted. Given unclear benefit and limiting side effect will hold rifampin for now. Will finish cefazolin on 05/11/22 and continue suppressive abx with cefadroxil 1 gram bid indefinitely     Now that she is off rifampin, her previous DOAC can be resumed    Covid management per primary team   --------- 05/09/22 Doing well Nausea basically resolved and appetite better since rifampin stopped 04/20/22 Recovering from covid infection still weak and on 2 liters o2 No  concerning changes in terms of tavr/pacer infection  Finish cefazolin on 2/8 Start cefadroxil 1 gram twice a day on 05/12/22 F/u 6 weeks    Follow-up: Return in about 6 weeks (around 06/20/2022).      Jabier Mutton, [facility] 05/09/2022, 11:11 AM

## 2022-05-09 NOTE — Patient Instructions (Signed)
On 05/11/22 you'll finish intravenous antibiotics. Then you'll start oral antibiotics with cefadroxil indefinitely   Picc line can be removed on 05/11/22   See me again in 6 weeks

## 2022-05-10 DIAGNOSIS — G4700 Insomnia, unspecified: Secondary | ICD-10-CM | POA: Diagnosis not present

## 2022-05-10 DIAGNOSIS — E785 Hyperlipidemia, unspecified: Secondary | ICD-10-CM | POA: Diagnosis not present

## 2022-05-10 DIAGNOSIS — K219 Gastro-esophageal reflux disease without esophagitis: Secondary | ICD-10-CM | POA: Diagnosis not present

## 2022-05-10 DIAGNOSIS — E559 Vitamin D deficiency, unspecified: Secondary | ICD-10-CM | POA: Diagnosis not present

## 2022-05-10 DIAGNOSIS — R7881 Bacteremia: Secondary | ICD-10-CM | POA: Diagnosis not present

## 2022-05-10 DIAGNOSIS — I5032 Chronic diastolic (congestive) heart failure: Secondary | ICD-10-CM | POA: Diagnosis not present

## 2022-05-10 DIAGNOSIS — F32A Depression, unspecified: Secondary | ICD-10-CM | POA: Diagnosis not present

## 2022-05-14 ENCOUNTER — Emergency Department (HOSPITAL_COMMUNITY): Payer: Medicare Other

## 2022-05-14 ENCOUNTER — Other Ambulatory Visit: Payer: Self-pay

## 2022-05-14 ENCOUNTER — Inpatient Hospital Stay (HOSPITAL_COMMUNITY)
Admission: EM | Admit: 2022-05-14 | Discharge: 2022-05-19 | DRG: 291 | Disposition: A | Discharge status: Skilled Nursing Facility | Payer: Medicare Other | Attending: Internal Medicine | Admitting: Internal Medicine

## 2022-05-14 ENCOUNTER — Encounter (HOSPITAL_COMMUNITY): Payer: Self-pay | Admitting: Internal Medicine

## 2022-05-14 DIAGNOSIS — M1990 Unspecified osteoarthritis, unspecified site: Secondary | ICD-10-CM | POA: Diagnosis not present

## 2022-05-14 DIAGNOSIS — W19XXXA Unspecified fall, initial encounter: Secondary | ICD-10-CM | POA: Diagnosis not present

## 2022-05-14 DIAGNOSIS — I482 Chronic atrial fibrillation, unspecified: Secondary | ICD-10-CM | POA: Diagnosis not present

## 2022-05-14 DIAGNOSIS — F03A18 Unspecified dementia, mild, with other behavioral disturbance: Secondary | ICD-10-CM | POA: Diagnosis present

## 2022-05-14 DIAGNOSIS — I10 Essential (primary) hypertension: Secondary | ICD-10-CM | POA: Diagnosis present

## 2022-05-14 DIAGNOSIS — E118 Type 2 diabetes mellitus with unspecified complications: Secondary | ICD-10-CM | POA: Diagnosis not present

## 2022-05-14 DIAGNOSIS — R079 Chest pain, unspecified: Secondary | ICD-10-CM | POA: Diagnosis not present

## 2022-05-14 DIAGNOSIS — I4891 Unspecified atrial fibrillation: Secondary | ICD-10-CM | POA: Diagnosis present

## 2022-05-14 DIAGNOSIS — Z8249 Family history of ischemic heart disease and other diseases of the circulatory system: Secondary | ICD-10-CM

## 2022-05-14 DIAGNOSIS — Z7901 Long term (current) use of anticoagulants: Secondary | ICD-10-CM

## 2022-05-14 DIAGNOSIS — F03918 Unspecified dementia, unspecified severity, with other behavioral disturbance: Secondary | ICD-10-CM | POA: Diagnosis not present

## 2022-05-14 DIAGNOSIS — R8271 Bacteriuria: Secondary | ICD-10-CM | POA: Diagnosis present

## 2022-05-14 DIAGNOSIS — Z6841 Body Mass Index (BMI) 40.0 and over, adult: Secondary | ICD-10-CM | POA: Diagnosis not present

## 2022-05-14 DIAGNOSIS — M6281 Muscle weakness (generalized): Secondary | ICD-10-CM | POA: Diagnosis not present

## 2022-05-14 DIAGNOSIS — E785 Hyperlipidemia, unspecified: Secondary | ICD-10-CM | POA: Diagnosis present

## 2022-05-14 DIAGNOSIS — Z823 Family history of stroke: Secondary | ICD-10-CM

## 2022-05-14 DIAGNOSIS — J449 Chronic obstructive pulmonary disease, unspecified: Secondary | ICD-10-CM | POA: Diagnosis not present

## 2022-05-14 DIAGNOSIS — Z8679 Personal history of other diseases of the circulatory system: Secondary | ICD-10-CM | POA: Diagnosis not present

## 2022-05-14 DIAGNOSIS — M109 Gout, unspecified: Secondary | ICD-10-CM | POA: Diagnosis not present

## 2022-05-14 DIAGNOSIS — E876 Hypokalemia: Secondary | ICD-10-CM | POA: Diagnosis not present

## 2022-05-14 DIAGNOSIS — Z950 Presence of cardiac pacemaker: Secondary | ICD-10-CM

## 2022-05-14 DIAGNOSIS — Z807 Family history of other malignant neoplasms of lymphoid, hematopoietic and related tissues: Secondary | ICD-10-CM

## 2022-05-14 DIAGNOSIS — K219 Gastro-esophageal reflux disease without esophagitis: Secondary | ICD-10-CM | POA: Diagnosis not present

## 2022-05-14 DIAGNOSIS — R531 Weakness: Secondary | ICD-10-CM | POA: Diagnosis not present

## 2022-05-14 DIAGNOSIS — Z8616 Personal history of COVID-19: Secondary | ICD-10-CM | POA: Diagnosis not present

## 2022-05-14 DIAGNOSIS — I493 Ventricular premature depolarization: Secondary | ICD-10-CM | POA: Diagnosis present

## 2022-05-14 DIAGNOSIS — Z952 Presence of prosthetic heart valve: Secondary | ICD-10-CM

## 2022-05-14 DIAGNOSIS — F419 Anxiety disorder, unspecified: Secondary | ICD-10-CM | POA: Diagnosis present

## 2022-05-14 DIAGNOSIS — I4892 Unspecified atrial flutter: Secondary | ICD-10-CM | POA: Diagnosis not present

## 2022-05-14 DIAGNOSIS — Z888 Allergy status to other drugs, medicaments and biological substances status: Secondary | ICD-10-CM

## 2022-05-14 DIAGNOSIS — I110 Hypertensive heart disease with heart failure: Secondary | ICD-10-CM | POA: Diagnosis not present

## 2022-05-14 DIAGNOSIS — I5033 Acute on chronic diastolic (congestive) heart failure: Secondary | ICD-10-CM | POA: Diagnosis present

## 2022-05-14 DIAGNOSIS — R296 Repeated falls: Secondary | ICD-10-CM | POA: Diagnosis not present

## 2022-05-14 DIAGNOSIS — Z86711 Personal history of pulmonary embolism: Secondary | ICD-10-CM | POA: Diagnosis not present

## 2022-05-14 DIAGNOSIS — Z7401 Bed confinement status: Secondary | ICD-10-CM | POA: Diagnosis not present

## 2022-05-14 DIAGNOSIS — G629 Polyneuropathy, unspecified: Secondary | ICD-10-CM | POA: Diagnosis present

## 2022-05-14 DIAGNOSIS — I5030 Unspecified diastolic (congestive) heart failure: Secondary | ICD-10-CM | POA: Diagnosis not present

## 2022-05-14 DIAGNOSIS — E1165 Type 2 diabetes mellitus with hyperglycemia: Secondary | ICD-10-CM | POA: Diagnosis not present

## 2022-05-14 DIAGNOSIS — R404 Transient alteration of awareness: Secondary | ICD-10-CM | POA: Diagnosis not present

## 2022-05-14 DIAGNOSIS — J90 Pleural effusion, not elsewhere classified: Secondary | ICD-10-CM | POA: Diagnosis not present

## 2022-05-14 DIAGNOSIS — F32A Depression, unspecified: Secondary | ICD-10-CM | POA: Diagnosis not present

## 2022-05-14 DIAGNOSIS — I480 Paroxysmal atrial fibrillation: Secondary | ICD-10-CM | POA: Diagnosis not present

## 2022-05-14 DIAGNOSIS — I339 Acute and subacute endocarditis, unspecified: Secondary | ICD-10-CM | POA: Diagnosis not present

## 2022-05-14 DIAGNOSIS — J9811 Atelectasis: Secondary | ICD-10-CM | POA: Diagnosis not present

## 2022-05-14 DIAGNOSIS — I442 Atrioventricular block, complete: Secondary | ICD-10-CM | POA: Diagnosis not present

## 2022-05-14 DIAGNOSIS — I350 Nonrheumatic aortic (valve) stenosis: Secondary | ICD-10-CM | POA: Diagnosis not present

## 2022-05-14 DIAGNOSIS — Z9849 Cataract extraction status, unspecified eye: Secondary | ICD-10-CM

## 2022-05-14 DIAGNOSIS — Z7984 Long term (current) use of oral hypoglycemic drugs: Secondary | ICD-10-CM

## 2022-05-14 DIAGNOSIS — Z885 Allergy status to narcotic agent status: Secondary | ICD-10-CM

## 2022-05-14 DIAGNOSIS — Z79899 Other long term (current) drug therapy: Secondary | ICD-10-CM

## 2022-05-14 DIAGNOSIS — E039 Hypothyroidism, unspecified: Secondary | ICD-10-CM | POA: Diagnosis present

## 2022-05-14 DIAGNOSIS — R059 Cough, unspecified: Secondary | ICD-10-CM | POA: Diagnosis not present

## 2022-05-14 DIAGNOSIS — Z66 Do not resuscitate: Secondary | ICD-10-CM | POA: Diagnosis present

## 2022-05-14 DIAGNOSIS — Z743 Need for continuous supervision: Secondary | ICD-10-CM | POA: Diagnosis not present

## 2022-05-14 LAB — URINALYSIS, ROUTINE W REFLEX MICROSCOPIC
Bacteria, UA: NONE SEEN
Bilirubin Urine: NEGATIVE
Glucose, UA: NEGATIVE mg/dL
Ketones, ur: 5 mg/dL — AB
Nitrite: POSITIVE — AB
Protein, ur: 30 mg/dL — AB
RBC / HPF: >50 RBC/hpf (ref 0–5)
Specific Gravity, Urine: 1.013 (ref 1.005–1.030)
WBC, UA: >50 WBC/hpf (ref 0–5)
pH: 7 (ref 5.0–8.0)

## 2022-05-14 LAB — COMPREHENSIVE METABOLIC PANEL
ALT: <5 U/L (ref 0–44)
AST: 28 U/L (ref 15–41)
Albumin: 2.6 g/dL — ABNORMAL LOW (ref 3.5–5.0)
Alkaline Phosphatase: 69 U/L (ref 38–126)
Anion gap: 13 (ref 5–15)
BUN: 9 mg/dL (ref 8–23)
CO2: 29 mmol/L (ref 22–32)
Calcium: 8.7 mg/dL — ABNORMAL LOW (ref 8.9–10.3)
Chloride: 97 mmol/L — ABNORMAL LOW (ref 98–111)
Creatinine, Ser: 0.76 mg/dL (ref 0.44–1.00)
GFR, Estimated: >60 mL/min (ref >60)
Glucose, Bld: 139 mg/dL — ABNORMAL HIGH (ref 70–99)
Potassium: 3.1 mmol/L — ABNORMAL LOW (ref 3.5–5.1)
Sodium: 139 mmol/L (ref 135–145)
Total Bilirubin: 0.6 mg/dL (ref 0.3–1.2)
Total Protein: 7.7 g/dL (ref 6.5–8.1)

## 2022-05-14 LAB — CBG MONITORING, ED
Glucose-Capillary: 126 mg/dL — ABNORMAL HIGH (ref 70–99)
Glucose-Capillary: 122 mg/dL — ABNORMAL HIGH (ref 70–99)

## 2022-05-14 LAB — CBC WITH DIFFERENTIAL/PLATELET
Abs Immature Granulocytes: 0.02 10*3/uL (ref 0.00–0.07)
Basophils Absolute: 0 10*3/uL (ref 0.0–0.1)
Basophils Relative: 0 %
Eosinophils Absolute: 0.1 10*3/uL (ref 0.0–0.5)
Eosinophils Relative: 2 %
HCT: 34.1 % — ABNORMAL LOW (ref 36.0–46.0)
Hemoglobin: 10.9 g/dL — ABNORMAL LOW (ref 12.0–15.0)
Immature Granulocytes: 0 %
Lymphocytes Relative: 23 %
Lymphs Abs: 1.4 10*3/uL (ref 0.7–4.0)
MCH: 32.6 pg (ref 26.0–34.0)
MCHC: 32 g/dL (ref 30.0–36.0)
MCV: 102.1 fL — ABNORMAL HIGH (ref 80.0–100.0)
Monocytes Absolute: 1.1 10*3/uL — ABNORMAL HIGH (ref 0.1–1.0)
Monocytes Relative: 19 %
Neutro Abs: 3.3 10*3/uL (ref 1.7–7.7)
Neutrophils Relative %: 56 %
Platelets: 220 10*3/uL (ref 150–400)
RBC: 3.34 MIL/uL — ABNORMAL LOW (ref 3.87–5.11)
RDW: 19 % — ABNORMAL HIGH (ref 11.5–15.5)
WBC: 5.9 10*3/uL (ref 4.0–10.5)
nRBC: 0 % (ref 0.0–0.2)

## 2022-05-14 LAB — BRAIN NATRIURETIC PEPTIDE: B Natriuretic Peptide: 907.2 pg/mL — ABNORMAL HIGH (ref 0.0–100.0)

## 2022-05-14 LAB — MAGNESIUM: Magnesium: 1.4 mg/dL — ABNORMAL LOW (ref 1.7–2.4)

## 2022-05-14 LAB — GLUCOSE, CAPILLARY: Glucose-Capillary: 180 mg/dL — ABNORMAL HIGH (ref 70–99)

## 2022-05-14 MED ORDER — INSULIN ASPART 100 UNIT/ML IJ SOLN
0.0000 [IU] | Freq: Three times a day (TID) | INTRAMUSCULAR | Status: DC
  Administered 2022-05-14: 3 [IU] via SUBCUTANEOUS
  Administered 2022-05-15: 4 [IU] via SUBCUTANEOUS
  Administered 2022-05-16 (×2): 3 [IU] via SUBCUTANEOUS
  Administered 2022-05-17: 4 [IU] via SUBCUTANEOUS
  Administered 2022-05-17 – 2022-05-19 (×4): 3 [IU] via SUBCUTANEOUS

## 2022-05-14 MED ORDER — FUROSEMIDE 10 MG/ML IJ SOLN
40.0000 mg | Freq: Three times a day (TID) | INTRAMUSCULAR | Status: AC
  Administered 2022-05-14 – 2022-05-15 (×3): 40 mg via INTRAVENOUS
  Filled 2022-05-14 (×3): qty 4

## 2022-05-14 MED ORDER — ALLOPURINOL 100 MG PO TABS
100.0000 mg | ORAL_TABLET | Freq: Every day | ORAL | Status: DC
  Administered 2022-05-14 – 2022-05-19 (×6): 100 mg via ORAL
  Filled 2022-05-14 (×6): qty 1

## 2022-05-14 MED ORDER — POTASSIUM CHLORIDE 10 MEQ/100ML IV SOLN
10.0000 meq | Freq: Once | INTRAVENOUS | Status: AC
  Administered 2022-05-14: 10 meq via INTRAVENOUS
  Filled 2022-05-14: qty 100

## 2022-05-14 MED ORDER — ROSUVASTATIN CALCIUM 5 MG PO TABS
10.0000 mg | ORAL_TABLET | ORAL | Status: DC
  Administered 2022-05-15 – 2022-05-19 (×3): 10 mg via ORAL
  Filled 2022-05-14 (×3): qty 2

## 2022-05-14 MED ORDER — EZETIMIBE 10 MG PO TABS
10.0000 mg | ORAL_TABLET | Freq: Every day | ORAL | Status: DC
  Administered 2022-05-14 – 2022-05-19 (×6): 10 mg via ORAL
  Filled 2022-05-14 (×6): qty 1

## 2022-05-14 MED ORDER — SODIUM CHLORIDE 0.9 % IV SOLN: 250.0000 mL | INTRAVENOUS | Status: DC | PRN

## 2022-05-14 MED ORDER — ACETAMINOPHEN 325 MG PO TABS
650.0000 mg | ORAL_TABLET | ORAL | Status: DC | PRN
  Administered 2022-05-16 – 2022-05-19 (×4): 650 mg via ORAL
  Filled 2022-05-14 (×4): qty 2

## 2022-05-14 MED ORDER — ONDANSETRON HCL 4 MG/2ML IJ SOLN: 4.0000 mg | Freq: Four times a day (QID) | INTRAMUSCULAR | Status: DC | PRN

## 2022-05-14 MED ORDER — APIXABAN 5 MG PO TABS
5.0000 mg | ORAL_TABLET | Freq: Two times a day (BID) | ORAL | Status: DC
  Administered 2022-05-14 – 2022-05-19 (×10): 5 mg via ORAL
  Filled 2022-05-14 (×10): qty 1

## 2022-05-14 MED ORDER — METOPROLOL TARTRATE 25 MG PO TABS
25.0000 mg | ORAL_TABLET | Freq: Two times a day (BID) | ORAL | Status: DC
  Administered 2022-05-14 – 2022-05-19 (×10): 25 mg via ORAL
  Filled 2022-05-14 (×10): qty 1

## 2022-05-14 MED ORDER — CEFADROXIL 500 MG PO CAPS
1000.0000 mg | ORAL_CAPSULE | Freq: Two times a day (BID) | ORAL | Status: DC
  Administered 2022-05-14 – 2022-05-19 (×11): 1000 mg via ORAL
  Filled 2022-05-14 (×12): qty 2

## 2022-05-14 MED ORDER — SODIUM CHLORIDE 0.9% FLUSH: 3.0000 mL | INTRAVENOUS | Status: DC | PRN

## 2022-05-14 MED ORDER — METFORMIN HCL 500 MG PO TABS
500.0000 mg | ORAL_TABLET | Freq: Two times a day (BID) | ORAL | Status: DC
  Administered 2022-05-14 – 2022-05-19 (×10): 500 mg via ORAL
  Filled 2022-05-14 (×10): qty 1

## 2022-05-14 MED ORDER — LEVOTHYROXINE SODIUM 75 MCG PO TABS
75.0000 ug | ORAL_TABLET | Freq: Every day | ORAL | Status: DC
  Administered 2022-05-15 – 2022-05-19 (×5): 75 ug via ORAL
  Filled 2022-05-14 (×5): qty 1

## 2022-05-14 MED ORDER — MAGNESIUM SULFATE 2 GM/50ML IV SOLN
2.0000 g | Freq: Once | INTRAVENOUS | Status: AC
  Administered 2022-05-14: 2 g via INTRAVENOUS
  Filled 2022-05-14: qty 50

## 2022-05-14 MED ORDER — B COMPLEX-C PO TABS
1.0000 | ORAL_TABLET | Freq: Every day | ORAL | Status: DC
  Administered 2022-05-15 – 2022-05-19 (×5): 1 via ORAL
  Filled 2022-05-14 (×5): qty 1

## 2022-05-14 MED ORDER — EMPAGLIFLOZIN 10 MG PO TABS
10.0000 mg | ORAL_TABLET | Freq: Every day | ORAL | Status: DC
  Administered 2022-05-14 – 2022-05-19 (×6): 10 mg via ORAL
  Filled 2022-05-14 (×6): qty 1

## 2022-05-14 MED ORDER — SACUBITRIL-VALSARTAN 97-103 MG PO TABS: 1.0000 | ORAL_TABLET | Freq: Two times a day (BID) | ORAL | Status: DC

## 2022-05-14 MED ORDER — SACUBITRIL-VALSARTAN 97-103 MG PO TABS
1.0000 | ORAL_TABLET | Freq: Two times a day (BID) | ORAL | Status: DC
  Administered 2022-05-14 – 2022-05-19 (×10): 1 via ORAL
  Filled 2022-05-14 (×11): qty 1

## 2022-05-14 MED ORDER — MELATONIN 3 MG PO TABS
3.0000 mg | ORAL_TABLET | Freq: Every evening | ORAL | Status: DC | PRN
  Administered 2022-05-14 – 2022-05-15 (×2): 3 mg via ORAL
  Filled 2022-05-14 (×2): qty 1

## 2022-05-14 MED ORDER — SODIUM CHLORIDE 0.9% FLUSH
3.0000 mL | Freq: Two times a day (BID) | INTRAVENOUS | Status: DC
  Administered 2022-05-14 – 2022-05-19 (×10): 3 mL via INTRAVENOUS

## 2022-05-14 NOTE — Assessment & Plan Note (Signed)
Per old record.  Will continue.

## 2022-05-14 NOTE — ED Triage Notes (Signed)
Pt BIBA to ED for weakness from home. Pt with multiple falls within the last 24hrs, unable to bear weight. Pt requesting rehab/placement.  EMS vitals.  75 16 95% 3L CB 171

## 2022-05-14 NOTE — Assessment & Plan Note (Signed)
Continue blood pressure control with entreto and metoprolol.

## 2022-05-14 NOTE — H&P (Signed)
History and Physical    Jessica [name] [id number] DOB: 03/08/1934 DOA: 05/14/2022  DOS: the patient was seen and examined on 05/14/2022  PCP: Plotnikov, Evie Lacks, MD   Patient coming from: Home  I have personally briefly reviewed patient's old medical records in [place] is a 89 y.o. female. 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   Since she was discharged from the hospital she has had ongoing weakness and difficulty ambulating.  Her legs are weak and she is unable to  ambulate which she attributes to abx treatment.  She endorses SOB/DOE even at rest. She is denies headache, chest pain, palpitations, fever, abdominal pain, dysuria, nausea, vomiting or diarrhea.   After her last discharge from hospital she was at [facility] for 9 days but has been home since, living alone with her cat. Her niece does check on her.    ED Course: T 97.7 148/113  HR 65  RR 22. Per EDP no respsiratory distress, appears chronically ill. Lab - K 3.1, Mag 1.4, Alb 2.6, BNP 907  CBCD nl, U/A cloudy, mod LE, > 50 WBC, CXR c/w CHF, L- hip no obious fracture but osteopenic. In ED TRH called to admit for further evaluation and management of CHF  Review of Systems:  Review of Systems  Constitutional:  Negative for chills and fever.  HENT: Negative.    Eyes: Negative.   Respiratory:  Positive for cough and shortness of breath.   Cardiovascular:  Positive for chest pain. Negative for palpitations and leg swelling.  Gastrointestinal: Negative.   Genitourinary: Negative.   Musculoskeletal: Negative.   Skin: Negative.   Neurological:  Positive for weakness.  Endo/Heme/Allergies: Negative.   Psychiatric/Behavioral: Negative.       Past Medical History:  Diagnosis Date   Anxiety    Atrial fibrillation and flutter ([place])    detected by PPM interrogation (mostly atrial flutter)   AV block, 2nd degree 10/2012   s/p MDT ADDRL1 pacemaker implantation 10/10/2012 by [name]   Chronic diastolic heart failure ([place])    Depression    GERD (gastroesophageal reflux disease)    HH (hiatus hernia)    History of pulmonary embolism 10/2008   Bilateral   Hyperlipidemia    Hypertension    Hypothyroidism    Obesity    Osteoarthritis    Peripheral neuropathy    Right leg   Presence of permanent cardiac pacemaker    Pulmonary nodule    in the lingula. Noted on pre TAVR CT. Will require follow up   Renal insufficiency 2010   S/P TAVR (transcatheter aortic valve replacement)    Shingles 09/17/2014   right lower quadrant   Type II or unspecified type diabetes mellitus without mention of complication, not stated as uncontrolled 2009    Past Surgical History:  Procedure Laterality Date   APPENDECTOMY     AV NODE ABLATION N/A 07/05/2017   Procedure: AV NODE ABLATION;  Surgeon: [name], MD;  Location: [facility];  Service: Cardiovascular;  Laterality: N/A;   CARPAL TUNNEL RELEASE     CATARACT EXTRACTION     CHOLECYSTECTOMY     COLONOSCOPY  2010   COLONOSCOPY WITH PROPOFOL N/A 07/25/2021   Procedure: COLONOSCOPY WITH PROPOFOL;  Surgeon:  Mauri Pole, MD;  Location: [facility];  Service: Gastroenterology;  Laterality: N/A;   FOREARM FRACTURE SURGERY  09/17/2008   HERNIA REPAIR     INGUINAL HERNIA REPAIR     PACEMAKER INSERTION  10/10/2012   MDT ADDRL1 implanted for 2nd degree AV block by [name]   PERMANENT PACEMAKER INSERTION N/A 10/10/2012   Procedure: PERMANENT PACEMAKER INSERTION;  Surgeon: [name], MD;  Location: [facility] C[facility];  Service: Cardiovascular;  Laterality: N/A;   RIGHT/LEFT HEART CATH AND CORONARY ANGIOGRAPHY N/A 12/30/2018   Procedure: RIGHT/LEFT HEART CATH AND CORONARY  ANGIOGRAPHY;  Surgeon: [name], MD;  Location: [facility];  Service: Cardiovascular;  Laterality: N/A;   ROTATOR CUFF REPAIR     TEE WITHOUT CARDIOVERSION N/A 01/14/2019   Procedure: TRANSESOPHAGEAL ECHOCARDIOGRAM (TEE);  Surgeon: [name], MD;  Location: [facility];  Service: Open Heart Surgery;  Laterality: N/A;   TEE WITHOUT CARDIOVERSION N/A 04/05/2022   Procedure: TRANSESOPHAGEAL ECHOCARDIOGRAM (TEE);  Surgeon: [name], MD;  Location: [facility];  Service: Cardiovascular;  Laterality: N/A;   TRANSCATHETER AORTIC VALVE REPLACEMENT, TRANSFEMORAL N/A 01/14/2019   Procedure: TRANSCATHETER AORTIC VALVE REPLACEMENT, TRANSFEMORAL;  Surgeon: [name], MD;  Location: [facility];  Service: Open Heart Surgery;  Laterality: N/A;    Soc Hx - widowed 20 years after 75 years of marriage. She has buried 3 of heer 4 children, remaining daughter with amputation and multiple medical problems. She lives alone with her car.    reports that she has never smoked. She has never used smokeless tobacco. She reports that she does not drink alcohol and does not use drugs.  Allergies  Allergen Reactions   Coreg [Carvedilol] Other (See Comments)    Weak legs   Relafen [Nabumetone] Other (See Comments)    Upset stomach   Calcium Other (See Comments)    Unknown reaction   Codeine Other (See Comments)    Unknown reaction   Lipitor [Atorvastatin] Other (See Comments)    Myalgias   Pacerone [Amiodarone] Other (See Comments)    Hand tremors   Vasotec [Enalapril] Cough   Vioxx [Rofecoxib] Other (See Comments)    Unknown reaction   Zocor [Simvastatin] Other (See Comments)    Myalgias    Family History  Problem Relation Age of Onset   Stroke Brother 5   Coronary artery disease Mother    Heart disease Mother 65   Stroke Father 63   Multiple myeloma Brother    Heart attack Son 66       Died of MI   Heart attack Daughter 71        Died of MI   Aneurysm Daughter 44       Question cerebral   Heart disease Maternal Grandmother    Heart disease Maternal Grandfather    Peripheral vascular disease Daughter        Amputation secondary to DM    Prior to Admission medications   Medication Sig Start Date End Date Taking? Authorizing Provider  cefadroxil (DURICEF) 500 MG capsule Take 1,000 mg by mouth 2 (two) times daily. 05/13/22  Yes [provider]  acetaminophen (TYLENOL) 500 MG tablet Take 2 tablets (1,000 mg total) by mouth every 8 (eight) hours for 5 days, THEN 2 tablets (1,000 mg total) every 8 (eight) hours as needed for up to 25 days. 04/24/22 05/24/22  Mercy Riding, MD  albuterol (VENTOLIN HFA) 108 (90 Base) MCG/ACT inhaler  Inhale 2 puffs into the lungs every 6 (six) hours as needed for wheezing or shortness of breath. 04/24/22   Mercy Riding, MD  allopurinol (ZYLOPRIM) 100 MG tablet TAKE 1 TABLET BY MOUTH DAILY Patient taking differently: Take 100 mg by mouth daily. 02/13/22   Plotnikov, Evie Lacks, MD  apixaban (ELIQUIS) 5 MG TABS tablet Take 1 tablet (5 mg total) by mouth 2 (two) times daily. 04/26/22 07/25/22  Mercy Riding, MD  b complex vitamins tablet Take 1 tablet by mouth daily.    [provider]  bisacodyl (DULCOLAX) 5 MG EC tablet Take 5 mg by mouth daily as needed for mild constipation.    [provider]  Carboxymethylcellul-Glycerin (EQ LUBRICATING EYE DROPS OP) Place 1 drop into both eyes as needed (dry eyes).    [provider]  ceFAZolin (ANCEF) IVPB Inject 2 g into the vein every 8 (eight) hours for 20 days. Indication:  MSSA PVE First Dose: Yes Last Day of Therapy:  05/11/22 Labs - Once weekly:  CBC/D and CMP, Labs - Every other week:  ESR and CRP Method of administration: IV Push Pull PICC line at the completion of IV antibiotics Method of administration may be changed at the discretion of home infusion pharmacist based upon assessment of the patient and/or  caregiver's ability to self-administer the medication ordered. 04/24/22 05/14/22  Mercy Riding, MD  Cholecalciferol 1000 UNITS tablet Take 1,000 Units by mouth daily.    [provider]  docusate sodium (COLACE) 100 MG capsule Take 100 mg by mouth daily as needed for mild constipation or moderate constipation.    [provider]  ezetimibe (ZETIA) 10 MG tablet TAKE 1 TABLET BY MOUTH DAILY 02/13/22   Vickie Epley, MD  famotidine (PEPCID) 20 MG tablet Take 1 tablet (20 mg total) by mouth daily. 04/25/22 05/25/22  Godfrey Pick, MD  furosemide (LASIX) 40 MG tablet Take 1 tablet (40 mg total) by mouth daily as needed for fluid or edema. 04/24/22   Mercy Riding, MD  Heparin Sod, Pork, Lock Flush (HEPARIN LOCK FLUSH IV) Inject 10 mLs into the vein 3 (three) times daily. 04/12/22 05/11/22  [provider]  levothyroxine (SYNTHROID) 75 MCG tablet TAKE 1 TABLET BY MOUTH DAILY Patient taking differently: Take 75 mcg by mouth daily. 02/13/22   Plotnikov, Evie Lacks, MD  metFORMIN (GLUCOPHAGE) 500 MG tablet TAKE 1 TABLET BY MOUTH TWICE  DAILY WITH MEALS 02/13/22   Plotnikov, Evie Lacks, MD  metoprolol tartrate (LOPRESSOR) 25 MG tablet Take 1 tablet (25 mg total) by mouth 2 (two) times daily. 04/09/22 07/08/22  Kayleen Memos, DO  Multiple Vitamin (MULTIVITAMIN) tablet Take 1 tablet by mouth daily.    [provider]  nystatin powder Apply 1 Application topically daily. Apply to under breast topically every day shift for redness/moisture.    [provider]  OXYGEN Inhale 2 L into the lungs 3 (three) times daily.    [provider]  polyvinyl alcohol (LIQUIFILM TEARS) 1.4 % ophthalmic solution 1 drop every 8 (eight) hours as needed for dry eyes.    [provider]  repaglinide (PRANDIN) 2 MG tablet TAKE 2 TABLETS BY MOUTH 3  TIMES DAILY BEFORE MEALS Patient taking differently: Take 2 mg by mouth 3 (three) times daily before meals. 11/07/21   Plotnikov, Evie Lacks, MD  rosuvastatin (CRESTOR) 10 MG tablet TAKE 1 TABLET BY MOUTH UP TO 3  TIMES WEEKLY AS TOLERATED Patient taking differently:  Take 10 mg by mouth every Monday, Wednesday, and Friday. 11/14/21   Skeet Latch, MD  saccharomyces boulardii (FLORASTOR) 250 MG capsule Take 250 mg by mouth 2 (two) times daily.    [provider]  sacubitril-valsartan (ENTRESTO) 97-103 MG Take 1 tablet by mouth 2 (two) times daily. 05/01/22   Mercy Riding, MD  senna (SENOKOT) 8.6 MG TABS tablet Take 1 tablet by mouth daily as needed for mild constipation.    [provider]  sodium chloride flush (NS) 0.9 % SOLN Inject 10 mLs into the vein 3 (three) times daily. Until 02.08.2024    [provider]  temazepam (RESTORIL) 7.5 MG capsule Take 7.5 mg by mouth at bedtime. 04/10/22   [provider]  venlafaxine XR (EFFEXOR-XR) 75 MG 24 hr capsule TAKE 1 CAPSULE BY MOUTH  DAILY WITH BREAKFAST Patient taking differently: Take 75 mg by mouth daily. 05/02/21   Plotnikov, Evie Lacks, MD    Physical Exam: Vitals:   05/14/22 1300 05/14/22 1430 05/14/22 1445 05/14/22 1530  BP: (!) 148/113 (!) 146/59  (!) 129/59  Pulse: 65  65   Resp: (!) 22  (!) 22 (!) 23  Temp:      TempSrc:      SpO2: 95%  100% 99%  Weight:      Height:        Physical Exam Vitals and nursing note reviewed.  Constitutional:      General: She is not in acute distress.    Appearance: She is obese. She is ill-appearing. She is not diaphoretic.  HENT:     Head: Normocephalic and atraumatic.     Mouth/Throat:     Mouth: Mucous membranes are moist.     Pharynx: Oropharynx is clear.  Eyes:     Extraocular Movements: Extraocular movements intact.     Pupils: Pupils are equal, round, and reactive to light.  Cardiovascular:     Rate and Rhythm: Normal rate and regular rhythm.     Pulses: Normal pulses.     Heart sounds: No murmur heard. Pulmonary:     Effort: Pulmonary effort is normal.     Breath sounds: Rales  present.     Comments: Decreased BS left base, mild rales right base, no wheezing Abdominal:     Palpations: Abdomen is soft.     Tenderness: There is no guarding or rebound.  Musculoskeletal:        General: No swelling, deformity or signs of injury.     Cervical back: Normal range of motion and neck supple.     Right lower leg: Edema present.     Left lower leg: No edema.  Skin:    General: Skin is warm and dry.     Comments: Erythematous scaling rash both feet.   Neurological:     Mental Status: She is alert and oriented to person, place, and time.     Cranial Nerves: No cranial nerve deficit.  Psychiatric:        Mood and Affect: Mood normal.        Behavior: Behavior normal.      Labs on Admission: I have personally reviewed following labs and imaging studies  CBC: Recent Labs  Lab 05/14/22 0740  WBC 5.9  NEUTROABS 3.3  HGB 10.9*  HCT 34.1*  MCV 102.1*  PLT [id number]   Basic Metabolic Panel: Recent Labs  Lab 05/14/22 0740  NA 139  K 3.1*  CL 97*  CO2 29  GLUCOSE  139*  BUN 9  CREATININE 0.76  CALCIUM 8.7*  MG 1.4*   GFR: Estimated Creatinine Clearance: 52.8 mL/min (by C-G formula based on SCr of 0.76 mg/dL). Liver Function Tests: Recent Labs  Lab 05/14/22 0740  AST 28  ALT <5  ALKPHOS 69  BILITOT 0.6  PROT 7.7  ALBUMIN 2.6*   No results for input(s): "LIPASE", "AMYLASE" in the last 168 hours. No results for input(s): "AMMONIA" in the last 168 hours. Coagulation Profile: No results for input(s): "INR", "PROTIME" in the last 168 hours. Cardiac Enzymes: No results for input(s): "CKTOTAL", "CKMB", "CKMBINDEX", "TROPONINI" in the last 168 hours. BNP (last 3 results) No results for input(s): "PROBNP" in the last 8760 hours. HbA1C: No results for input(s): "HGBA1C" in the last 72 hours. CBG: Recent Labs  Lab 05/14/22 0750  GLUCAP 126*   Lipid Profile: No results for input(s): "CHOL", "HDL", "LDLCALC", "TRIG", "CHOLHDL", "LDLDIRECT" in the last 72  hours. Thyroid Function Tests: No results for input(s): "TSH", "T4TOTAL", "FREET4", "T3FREE", "THYROIDAB" in the last 72 hours. Anemia Panel: No results for input(s): "VITAMINB12", "FOLATE", "FERRITIN", "TIBC", "IRON", "RETICCTPCT" in the last 72 hours. Urine analysis:    Component Value Date/Time   COLORURINE AMBER (A) 05/14/2022 0725   APPEARANCEUR CLOUDY (A) 05/14/2022 0725   LABSPEC 1.013 05/14/2022 0725   PHURINE 7.0 05/14/2022 0725   GLUCOSEU NEGATIVE 05/14/2022 0725   GLUCOSEU NEGATIVE 09/07/2020 1244   HGBUR LARGE (A) 05/14/2022 0725   BILIRUBINUR NEGATIVE 05/14/2022 0725   KETONESUR 5 (A) 05/14/2022 0725   PROTEINUR 30 (A) 05/14/2022 0725   UROBILINOGEN 0.2 09/07/2020 1244   NITRITE POSITIVE (A) 05/14/2022 0725   LEUKOCYTESUR MODERATE (A) 05/14/2022 0725    Radiological Exams on Admission: I have personally reviewed images DG Hip Unilat With Pelvis 2-3 Views Left  Result Date: 05/14/2022 CLINICAL DATA:  Cough, weakness, hip pain EXAM: DG HIP (WITH OR WITHOUT PELVIS) 2-3V LEFT COMPARISON:  None Available. FINDINGS: No acute fracture or dislocation. No aggressive osseous lesion. Normal alignment. Mild osteoarthritis of the left hip. Generalized osteopenia. Soft tissue are unremarkable. No radiopaque foreign body or soft tissue emphysema. IMPRESSION: 1. Mild osteoarthritis of the left hip. Given the patient's age and osteopenia, if there is persistent clinical concern for an occult hip fracture, a MRI of the hip is recommended for increased sensitivity. Electronically Signed   By: Kathreen Devoid M.D.   On: 05/14/2022 09:35   DG Chest Port 1 View  Result Date: 05/14/2022 CLINICAL DATA:  Cough, pain EXAM: PORTABLE CHEST 1 VIEW COMPARISON:  04/25/2022 FINDINGS: Bilateral interstitial thickening. Small bilateral pleural effusions. Bibasilar atelectasis. No pneumothorax. Stable cardiomegaly. Prior TAVR. Dual lead cardiac pacemaker. No acute osseous abnormality. IMPRESSION: 1. Findings  concerning for CHF. Electronically Signed   By: Kathreen Devoid M.D.   On: 05/14/2022 09:34    EKG: I have personally reviewed EKG: Paced rhythm, IVCD w/ LAD, LVH  Assessment/Plan Active Problems:   History of endocarditis   Acute on chronic diastolic CHF (congestive heart failure) ([place])   DM type 2, controlled, with complication ([facility])   Hypothyroidism   Essential hypertension   GERD (gastroesophageal reflux disease)   DNR (do not resuscitate)   Dementia with behavioral disturbance ([place])    Assessment and Plan: History of endocarditis Recent hospitalization for endocarditits - 3 cm vegetation on TVR prosthesis by TEE1/3/24. She has completed course of IV ancef and is on oral abx indefinitely. CBCD nl, no fever  Plan Continue PO  abx.  Acute on chronic diastolic CHF (congestive heart failure) ([place]) Patient with known HFpEF.TEE 04/05/22 EF 55-60%, moderate pleural effusion. She at last discharge was on entresto,lopressor, furosemide apparently not taking at home. Mild dementia and living alone are contributing factors. She now presents with acute on chronic HFpEF.  Plan Tele admit  Restart medications as of last hospital discharge  Heart failure protocols  TOC   DM type 2, controlled, with complication ([place]) Poorly controlled diabetic.Currently on metformin. Prandin - off the market. Jardaiance or Dorian Pod would be helpful in setting of heart failure-cost as been an issue as an outpt.  Plan Continue metformin  Jardiance 10 mg daily  Ss coverage.  Hypothyroidism Last TSH 03/28/22 1.46  Plan Continue present dose of synthroid.   Dementia with behavioral disturbance [facility]) Per old record patient with dementia but not on medication other than anti-depressants. She answers questions but lacks insight to her problems. Unlikely that she can manage complex medical regimen on her own. At 89 unlikely to benefit for medication interventions.   DNR (do not resuscitate) Per old record.   Will continue.   GERD (gastroesophageal reflux disease) Denies discomfort or GI symptoms  Plan PPI daily  Essential hypertension BP elevated at admission. Adherence to complex cardiac regimen doubtful  Plan Restart medications as listed at last hospital discharge  Close monitoring.       DVT prophylaxis: Eliquis Code Status: DNR/DNI(Do NOT Intubate) Family Communication: spoke with niece Cresenciano Lick - confirms DNR status, agrees to persuing long term care  Disposition Plan: TBD  Consults called: non3  Admission status: Inpatient, Telemetry bed   Adella Hare, MD [facility] 05/14/2022, 4:13 PM

## 2022-05-14 NOTE — Assessment & Plan Note (Signed)
Denies discomfort or GI symptoms  Plan PPI daily

## 2022-05-14 NOTE — Assessment & Plan Note (Signed)
Poorly controlled diabetic.Currently on metformin. Prandin - off the market. Jardaiance or Hendricks Limes would be helpful in setting of heart failure-cost as been an issue as an outpt.  Plan Continue metformin  Jardiance 10 mg daily  Ss coverage.

## 2022-05-14 NOTE — ED Provider Notes (Addendum)
[name]   CSN: [id number] Arrival date & time: 05/14/22  [id number]     History  Chief Complaint  Patient presents with   Weakness    Jessica [name] is a 89 y.o. female. 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   Patient states that she since she was discharged from the hospital she has had ongoing weakness and difficulty ambulating.  She states that when she stands up she feels like her legs are weak and she is unable to begin ambulating and has to sit back down again.  She denies having pain in her legs or feeling dizzy or lightheaded.  She states that it is lower extremity weakness.  She is denying any other symptoms like headache, chest pain, palpitations, shortness of breath, fever, abdominal pain, dysuria, nausea, vomiting or diarrhea.  On chart review it does appear that she was admitted from 04/18/2022 - 04/24/2022 after presenting with nausea, diarrhea, poor p.o. intake and was admitted for AKI, hypotension, COPD exacerbation with recent COVID-19 infection.  Stay was complicated by delirium.  She was eventually discharged with outpatient follow-up with ID.  Prior to that she was admitted from 03/28/2022 - 04/09/2022 after being found at home with altered mental status.  At that time she was febrile, hypotensive.  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 She was discharged to facility for 6 weeks for IV abx.   HPI     Home Medications Prior to Admission medications   Medication Sig Start Date End Date Taking? Authorizing Provider  acetaminophen (TYLENOL) 500 MG tablet Take 2 tablets (1,000 mg total) by mouth every 8  (eight) hours for 5 days, THEN 2 tablets (1,000 mg total) every 8 (eight) hours as needed for up to 25 days. 04/24/22 05/24/22  Mercy Riding, MD  albuterol (VENTOLIN HFA) 108 (90 Base) MCG/ACT inhaler Inhale 2 puffs into the lungs every 6 (six) hours as needed for wheezing or shortness of breath. 04/24/22   Mercy Riding, MD  allopurinol (ZYLOPRIM) 100 MG tablet TAKE 1 TABLET BY MOUTH DAILY Patient taking differently: Take 100 mg by mouth daily. 02/13/22   Plotnikov, Evie Lacks, MD  apixaban (ELIQUIS) 5 MG TABS tablet Take 1 tablet (5 mg total) by mouth 2 (two) times daily. 04/26/22 07/25/22  Mercy Riding, MD  b complex vitamins tablet Take 1 tablet by mouth daily.    [provider]  bisacodyl (DULCOLAX) 5 MG EC tablet Take 5 mg by mouth daily as needed for mild constipation.    [provider]  Carboxymethylcellul-Glycerin (EQ LUBRICATING EYE DROPS OP) Place 1 drop into both eyes as needed (dry eyes).    [provider]  ceFAZolin (ANCEF) IVPB Inject 2 g into the vein every 8 (eight) hours for 20 days. Indication:  MSSA PVE First Dose: Yes Last Day of Therapy:  05/11/22 Labs - Once weekly:  CBC/D and CMP, Labs - Every other week:  ESR and CRP Method of administration: IV Push Pull PICC line at the completion of IV antibiotics Method of administration may be changed at the discretion of home infusion pharmacist based upon assessment of the patient and/or caregiver's ability  to self-administer the medication ordered. 04/24/22 05/14/22  Mercy Riding, MD  Cholecalciferol 1000 UNITS tablet Take 1,000 Units by mouth daily.    [provider]  docusate sodium (COLACE) 100 MG capsule Take 100 mg by mouth daily as needed for mild constipation or moderate constipation.    [provider]  ezetimibe (ZETIA) 10 MG tablet TAKE 1 TABLET BY MOUTH DAILY 02/13/22   Vickie Epley, MD  famotidine (PEPCID) 20 MG tablet Take 1 tablet (20 mg total) by mouth daily. 04/25/22  05/25/22  Godfrey Pick, MD  furosemide (LASIX) 40 MG tablet Take 1 tablet (40 mg total) by mouth daily as needed for fluid or edema. 04/24/22   Mercy Riding, MD  Heparin Sod, Pork, Lock Flush (HEPARIN LOCK FLUSH IV) Inject 10 mLs into the vein 3 (three) times daily. 04/12/22 05/11/22  [provider]  levothyroxine (SYNTHROID) 75 MCG tablet TAKE 1 TABLET BY MOUTH DAILY Patient taking differently: Take 75 mcg by mouth daily. 02/13/22   Plotnikov, Evie Lacks, MD  metFORMIN (GLUCOPHAGE) 500 MG tablet TAKE 1 TABLET BY MOUTH TWICE  DAILY WITH MEALS 02/13/22   Plotnikov, Evie Lacks, MD  metoprolol tartrate (LOPRESSOR) 25 MG tablet Take 1 tablet (25 mg total) by mouth 2 (two) times daily. 04/09/22 07/08/22  Kayleen Memos, DO  Multiple Vitamin (MULTIVITAMIN) tablet Take 1 tablet by mouth daily.    [provider]  nystatin powder Apply 1 Application topically daily. Apply to under breast topically every day shift for redness/moisture.    [provider]  OXYGEN Inhale 2 L into the lungs 3 (three) times daily.    [provider]  polyvinyl alcohol (LIQUIFILM TEARS) 1.4 % ophthalmic solution 1 drop every 8 (eight) hours as needed for dry eyes.    [provider]  repaglinide (PRANDIN) 2 MG tablet TAKE 2 TABLETS BY MOUTH 3  TIMES DAILY BEFORE MEALS Patient taking differently: Take 2 mg by mouth 3 (three) times daily before meals. 11/07/21   Plotnikov, Evie Lacks, MD  rosuvastatin (CRESTOR) 10 MG tablet TAKE 1 TABLET BY MOUTH UP TO 3  TIMES WEEKLY AS TOLERATED Patient taking differently: Take 10 mg by mouth every Monday, Wednesday, and Friday. 11/14/21   Skeet Latch, MD  saccharomyces boulardii (FLORASTOR) 250 MG capsule Take 250 mg by mouth 2 (two) times daily.    [provider]  sacubitril-valsartan (ENTRESTO) 97-103 MG Take 1 tablet by mouth 2 (two) times daily. 05/01/22   Mercy Riding, MD  senna (SENOKOT) 8.6 MG TABS tablet Take 1 tablet by mouth daily as  needed for mild constipation.    [provider]  sodium chloride flush (NS) 0.9 % SOLN Inject 10 mLs into the vein 3 (three) times daily. Until 02.08.2024    [provider]  temazepam (RESTORIL) 7.5 MG capsule Take 7.5 mg by mouth at bedtime. 04/10/22   [provider]  venlafaxine XR (EFFEXOR-XR) 75 MG 24 hr capsule TAKE 1 CAPSULE BY MOUTH  DAILY WITH BREAKFAST Patient taking differently: Take 75 mg by mouth daily. 05/02/21   Plotnikov, Evie Lacks, MD      Allergies    Coreg [carvedilol], Relafen [nabumetone], Calcium, Codeine, Lipitor [atorvastatin], Pacerone [amiodarone], Vasotec [enalapril], Vioxx [rofecoxib], and Zocor [simvastatin]    Review of Systems   Review of Systems  Neurological:  Positive for weakness.  All other systems reviewed and are negative.   Physical Exam Updated Vital Signs BP 136/82   Pulse 72  Temp 97.7 F (36.5 C) (Oral)   Resp (!) 26   Ht 5' 2"$  (1.575 m)   Wt 90.7 kg   SpO2 96%   BMI 36.58 kg/m  Physical Exam Vitals and nursing note reviewed.  Constitutional:      General: She is not in acute distress.    Appearance: Normal appearance. She is obese. She is ill-appearing. She is not toxic-appearing.  HENT:     Head: Normocephalic.     Mouth/Throat:     Mouth: Mucous membranes are dry.     Pharynx: Oropharynx is clear.  Eyes:     General: No scleral icterus.    Extraocular Movements: Extraocular movements intact.     Pupils: Pupils are equal, round, and reactive to light.  Cardiovascular:     Rate and Rhythm: Normal rate. Rhythm irregularly irregular.     Pulses:          Radial pulses are 2+ on the right side and 2+ on the left side.       Dorsalis pedis pulses are 1+ on the right side and 1+ on the left side.     Heart sounds: Heart sounds are distant. No murmur heard. Pulmonary:     Effort: Prolonged expiration present. No respiratory distress.     Breath sounds: Decreased breath sounds present.  Abdominal:      General: Bowel sounds are normal. There is no distension.     Palpations: Abdomen is soft.     Tenderness: There is no abdominal tenderness.  Musculoskeletal:     Cervical back: Neck supple.     Right lower leg: 1+ Pitting Edema present.     Left lower leg: 1+ Pitting Edema present.  Skin:    General: Skin is warm and dry.     Capillary Refill: Capillary refill takes less than 2 seconds.     Findings: Erythema present.     Comments: Erythematous rash to bilateral lower extremities about the ankle concerning for cellulitis vs chronic vascular changes.   Neurological:     General: No focal deficit present.     Mental Status: She is alert.     Cranial Nerves: Cranial nerves 2-12 are intact.     Motor: Weakness present.  Psychiatric:        Attention and Perception: Attention normal.        Mood and Affect: Mood normal.        Speech: Speech normal.        Behavior: Behavior normal. Behavior is cooperative.        Cognition and Memory: Cognition is impaired.     ED Results / Procedures / Treatments   Labs (all labs ordered are listed, but only abnormal results are displayed) Labs Reviewed  COMPREHENSIVE METABOLIC PANEL - Abnormal; Notable for the following components:      Result Value   Potassium 3.1 (*)    Chloride 97 (*)    Glucose, Bld 139 (*)    Calcium 8.7 (*)    Albumin 2.6 (*)    All other components within normal limits  CBC WITH DIFFERENTIAL/PLATELET - Abnormal; Notable for the following components:   RBC 3.34 (*)    Hemoglobin 10.9 (*)    HCT 34.1 (*)    MCV 102.1 (*)    RDW 19.0 (*)    Monocytes Absolute 1.1 (*)    All other components within normal limits  MAGNESIUM - Abnormal; Notable for the following components:   Magnesium 1.4 (*)  All other components within normal limits  CBG MONITORING, ED - Abnormal; Notable for the following components:   Glucose-Capillary 126 (*)    All other components within normal limits  URINALYSIS, ROUTINE W REFLEX  MICROSCOPIC  BRAIN NATRIURETIC PEPTIDE    EKG EKG Interpretation  Date/Time:  Sunday May 14 2022 07:07:31 EST Ventricular Rate:  68 PR Interval:  174 QRS Duration: 177 QT Interval:  506 QTC Calculation: 539 R Axis:   -71 Text Interpretation: Atrial-sensed ventricular-paced rhythm Nonspecific IVCD with LAD LVH with secondary repolarization abnormality Confirmed by Dene Gentry [phone]) on 05/14/2022 7:27:15 AM  Radiology DG Hip Unilat With Pelvis 2-3 Views Left  Result Date: 05/14/2022 CLINICAL DATA:  Cough, weakness, hip pain EXAM: DG HIP (WITH OR WITHOUT PELVIS) 2-3V LEFT COMPARISON:  None Available. FINDINGS: No acute fracture or dislocation. No aggressive osseous lesion. Normal alignment. Mild osteoarthritis of the left hip. Generalized osteopenia. Soft tissue are unremarkable. No radiopaque foreign body or soft tissue emphysema. IMPRESSION: 1. Mild osteoarthritis of the left hip. Given the patient's age and osteopenia, if there is persistent clinical concern for an occult hip fracture, a MRI of the hip is recommended for increased sensitivity. Electronically Signed   By: Kathreen Devoid M.D.   On: 05/14/2022 09:35   DG Chest Port 1 View  Result Date: 05/14/2022 CLINICAL DATA:  Cough, pain EXAM: PORTABLE CHEST 1 VIEW COMPARISON:  04/25/2022 FINDINGS: Bilateral interstitial thickening. Small bilateral pleural effusions. Bibasilar atelectasis. No pneumothorax. Stable cardiomegaly. Prior TAVR. Dual lead cardiac pacemaker. No acute osseous abnormality. IMPRESSION: 1. Findings concerning for CHF. Electronically Signed   By: Kathreen Devoid M.D.   On: 05/14/2022 09:34    Procedures Procedures   Medications Ordered in ED Medications  magnesium sulfate IVPB 2 g 50 mL (2 g Intravenous New Bag/Given 05/14/22 1013)  potassium chloride 10 mEq in 100 mL IVPB (10 mEq Intravenous New Bag/Given 05/14/22 1014)    ED Course/ Medical Decision Making/ A&P  Medical Decision Making Amount and/or  Complexity of Data Reviewed Labs: ordered. Radiology: ordered.  Risk Prescription drug management. Decision regarding hospitalization.  Initial Impression and Ddx 89 year old female who presents for weakness. She is overall chronically ill appearing. COPD patterned breathing without hypoxia on exam. She has no other complaints than the weakness. Will obtain labs and check electrolytes, EKG.  Patient PMH that increases complexity of ED encounter:  diabetes, HTN, CHF, atrial fibrillation, COPD, TAVR vegetation   Interpretation of Diagnostics I independent reviewed and interpreted the labs as followed: K 3.1, mag 1.4,   - I independently visualized the following imaging with scope of interpretation limited to determining acute life threatening conditions related to emergency care: Chest x-ray, which revealed findings concerning for CHF, plain film of the left hip is negative  Patient Reassessment and Ultimate Disposition/Management 0900: K and Mag are low. Will replace both IV.  When I went to update the patient she does continue to have productive cough.  May be related to her COPD, however will obtain a 1 view chest ensure she does not have pneumonia.  Additionally she is complaining of left hip pain from one of her falls over the past few days.  Again I asked if she has struck her head and she denies this.  She states that she has slipped off her chair and landed on her bottom on the ground.  Will obtain a plain film of her left hip as well.  1045: Plain film of her chest and hip are  normal.  BNP is pending.  Currently getting electrolyte replacement.  Will get her up on the side of the bed and evaluate her ambulatory status.  1106: Attempted to get patient out of bed and required 2 person assist and was still not able to take a step.  Patient also became tachypneic, oxygen to 90%.  Will admit the patient.  1136: Consulted and spoke with Dr. Linda Hedges, hospitalist who agrees to admit the  patient.  Patient presents with weakness.  She is a chronically ill, elderly female.  Her weakness is likely multifactorial.  She has multiple chronic comorbidities.  Feel that she likely is also having CHF exacerbation at this time.  She was unable to ambulate here in the emergency department and required a two-person assist just to stand on the side of the bed.  Her oxygen went to 90%.  She is on room air and does not wear oxygen at home.  Additionally she has electrolyte abnormalities which are being replaced IV.  Pending a urinalysis and a BNP at this time.  Her chest x-ray does demonstrate some heart failure.  Given that she is unable to ambulate, not at her baseline will need to be optimized here in the hospital.  Ultimate disposition to SNF for rehab afterwards should be decided by admitting team at this time.  Patient management required discussion with the following services or consulting groups:  Hospitalist Service  Complexity of Problems Addressed Chronic illness with exacerbation  Additional Data Reviewed and Analyzed Further history obtained from: Past medical history and medications listed in the EMR, Prior ED visit notes, Recent Consult notes, Care Everywhere, and Prior labs/imaging results  Patient Encounter Risk Assessment Prescriptions, SDOH impact on management, and Consideration of hospitalization  Final Clinical Impression(s) / ED Diagnoses Final diagnoses:  Weakness    Rx / DC Orders ED Discharge Orders     None         Mickie Hillier, PA-C 05/14/22 1142    Mickie Hillier, PA-C 05/14/22 1305    Valarie Merino, MD 05/14/22 [phone]

## 2022-05-14 NOTE — Assessment & Plan Note (Signed)
Last TSH 03/28/22 1.46  Plan Continue present dose of synthroid.

## 2022-05-14 NOTE — Subjective & Objective (Signed)
Jessica [name] is a 89 y.o. female. 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   Since she was discharged from the hospital she has had ongoing weakness and difficulty ambulating.  Her legs are weak and she is unable to  ambulate which she attributes to abx treatment.  She endorses SOB/DOE even at rest. She is denies headache, chest pain, palpitations, fever, abdominal pain, dysuria, nausea, vomiting or diarrhea.   After her last discharge from hospital she was at [facility] for 9 days but has been home since, living alone with her cat. Her niece does check on her.

## 2022-05-14 NOTE — Care Management (Signed)
89 year old discharged from [facility] 1/22 to [facility], was on IV antibiotics for 6 weeks until 2/6  for bacterial endocarditis, had PICC line. Has had several falls at home. PT orders placed for assessment.  TOC will follow for needs, recommendations, and transitions

## 2022-05-14 NOTE — Progress Notes (Signed)
TOC CSW reached out to pts previous SNF, [facility].  Pt was at [facility] from 04/25/22-05/12/2022.  Admissions @ [facility] will return on 05/15/22 and inquiry about copay days could be achieved.  Tomasa Dobransky Tarpley-Carter, MSW, LCSW-A Pronouns:  She/Her/Hers C[facility] HealthTransitions of Care Clinical Social Worker Direct Number:  [phone] [email or web address]

## 2022-05-14 NOTE — Assessment & Plan Note (Addendum)
Patient with known HFpEF.TEE 04/05/22 EF 55-60%, moderate pleural effusion. She at last discharge was on entresto,lopressor, furosemide apparently not taking at home. Mild dementia and living alone are contributing factors. She now presents with acute on chronic HFpEF.  Plan Tele admit  Restart medications as of last hospital discharge  Heart failure protocols  TOC

## 2022-05-14 NOTE — ED Notes (Signed)
Family updated as to patient's status.Daughter Suzy Bouchard updated on pt plan of care

## 2022-05-14 NOTE — ED Notes (Signed)
Purex placed on pt for urine sample.

## 2022-05-14 NOTE — Assessment & Plan Note (Addendum)
Recent hospitalization for endocarditits - 3 cm vegetation on TVR prosthesis by TEE1/3/24. She has completed course of IV ancef and is on oral abx indefinitely. CBCD nl, no fever  Plan Continue PO  abx.

## 2022-05-14 NOTE — ED Notes (Signed)
.ED TO INPATIENT HANDOFF REPORT  ED Nurse Name and Phone #:  [phone]  S Name/Age/Gender Jessica [name] 89 y.o. female Room/Bed: 030C/030C  Code Status   Code Status: DNR  Home/SNF/Other Home Patient oriented to: self and place Is this baseline? Yes   Triage Complete: Triage complete  Chief Complaint Acute on chronic diastolic CHF (congestive heart failure), NYHA class 2 ([place]) [I50.33]  Triage Note Pt BIBA to ED for weakness from home. Pt with multiple falls within the last 24hrs, unable to bear weight. Pt requesting rehab/placement.  EMS vitals.  75 16 95% 3L CB 171   Allergies Allergies  Allergen Reactions   Coreg [Carvedilol] Other (See Comments)    Weak legs   Relafen [Nabumetone] Other (See Comments)    Upset stomach   Calcium Other (See Comments)    Unknown reaction   Codeine Other (See Comments)    Unknown reaction   Lipitor [Atorvastatin] Other (See Comments)    Myalgias   Pacerone [Amiodarone] Other (See Comments)    Hand tremors   Vasotec [Enalapril] Cough   Vioxx [Rofecoxib] Other (See Comments)    Unknown reaction   Zocor [Simvastatin] Other (See Comments)    Myalgias    Level of Care/Admitting Diagnosis ED Disposition     ED Disposition  Admit   Condition  --   [place]: [facility] [[id number]]  Level of Care: Telemetry Cardiac [[id number]]  May admit patient to [facility] or [facility] if equivalent level of care is available:: No  Covid Evaluation: Asymptomatic - no recent exposure (last 10 days) testing not required  Diagnosis: Acute on chronic diastolic CHF (congestive heart failure), NYHA class 2 ([place]) [id number]  Admitting Physician: Neena Rhymes [id number]]  Attending Physician: Neena Rhymes [id number]  Certification:: I certify this patient will need inpatient services for at least 2 midnights  Estimated Length of Stay: 4          B Medical/Surgery History Past Medical History:  Diagnosis Date    Anxiety    Atrial fibrillation and flutter ([place])    detected by PPM interrogation (mostly atrial flutter)   AV block, 2nd degree 10/2012   s/p MDT ADDRL1 pacemaker implantation 10/10/2012 by [name]   Chronic diastolic heart failure ([place])    Depression    GERD (gastroesophageal reflux disease)    HH (hiatus hernia)    History of pulmonary embolism 10/2008   Bilateral   Hyperlipidemia    Hypertension    Hypothyroidism    Obesity    Osteoarthritis    Peripheral neuropathy    Right leg   Presence of permanent cardiac pacemaker    Pulmonary nodule    in the lingula. Noted on pre TAVR CT. Will require follow up   Renal insufficiency 2010   S/P TAVR (transcatheter aortic valve replacement)    Shingles 09/17/2014   right lower quadrant   Type II or unspecified type diabetes mellitus without mention of complication, not stated as uncontrolled 2009   Past Surgical History:  Procedure Laterality Date   APPENDECTOMY     AV NODE ABLATION N/A 07/05/2017   Procedure: AV NODE ABLATION;  Surgeon: [name], MD;  Location: [facility];  Service: Cardiovascular;  Laterality: N/A;   CARPAL TUNNEL RELEASE     CATARACT EXTRACTION     CHOLECYSTECTOMY     COLONOSCOPY  2010   COLONOSCOPY WITH PROPOFOL N/A 07/25/2021   Procedure: COLONOSCOPY WITH  PROPOFOL;  Surgeon: Mauri Pole, MD;  Location: [facility];  Service: Gastroenterology;  Laterality: N/A;   FOREARM FRACTURE SURGERY  09/17/2008   HERNIA REPAIR     INGUINAL HERNIA REPAIR     PACEMAKER INSERTION  10/10/2012   MDT ADDRL1 implanted for 2nd degree AV block by [name]   PERMANENT PACEMAKER INSERTION N/A 10/10/2012   Procedure: PERMANENT PACEMAKER INSERTION;  Surgeon: [name], MD;  Location: [facility];  Service: Cardiovascular;  Laterality: N/A;   RIGHT/LEFT HEART CATH AND CORONARY ANGIOGRAPHY N/A 12/30/2018   Procedure: RIGHT/LEFT HEART CATH AND CORONARY ANGIOGRAPHY;  Surgeon: [name], MD;   Location: [facility];  Service: Cardiovascular;  Laterality: N/A;   ROTATOR CUFF REPAIR     TEE WITHOUT CARDIOVERSION N/A 01/14/2019   Procedure: TRANSESOPHAGEAL ECHOCARDIOGRAM (TEE);  Surgeon: [name], MD;  Location: [facility];  Service: Open Heart Surgery;  Laterality: N/A;   TEE WITHOUT CARDIOVERSION N/A 04/05/2022   Procedure: TRANSESOPHAGEAL ECHOCARDIOGRAM (TEE);  Surgeon: [name], MD;  Location: [facility];  Service: Cardiovascular;  Laterality: N/A;   TRANSCATHETER AORTIC VALVE REPLACEMENT, TRANSFEMORAL N/A 01/14/2019   Procedure: TRANSCATHETER AORTIC VALVE REPLACEMENT, TRANSFEMORAL;  Surgeon: [name], MD;  Location: [facility];  Service: Open Heart Surgery;  Laterality: N/A;     A IV Location/Drains/Wounds Patient Lines/Drains/Airways Status     Active Line/Drains/Airways     Name Placement date Placement time Site Days   Peripheral IV 05/14/22 22 G Anterior;Right Forearm 05/14/22  0751  Forearm  less than 1   PICC Single Lumen [id number] Right Basilic 39 cm 0 cm [id number]  [id number]  Basilic  23   External Urinary Catheter 04/18/22  2110  --  26   Wound / Incision (Open or Dehisced) 04/19/22 Skin tear Arm Left Flap intact, above left elbow. 04/19/22  1800  Arm  25            Intake/Output Last 24 hours No intake or output data in the 24 hours ending 05/14/22 1637  Labs/Imaging Results for orders placed or performed during the hospital encounter of 05/14/22 (from the past 48 hour(s))  Urinalysis, Routine w reflex microscopic -Urine, Clean Catch     Status: Abnormal   Collection Time: 05/14/22  7:25 AM  Result Value Ref Range   Color, Urine AMBER (A) YELLOW    Comment: BIOCHEMICALS MAY BE AFFECTED BY COLOR   APPearance CLOUDY (A) CLEAR   Specific Gravity, Urine 1.013 1.005 - 1.030   pH 7.0 5.0 - 8.0   Glucose, UA NEGATIVE NEGATIVE mg/dL   Hgb urine dipstick LARGE (A) NEGATIVE   Bilirubin Urine NEGATIVE NEGATIVE    Ketones, ur 5 (A) NEGATIVE mg/dL   Protein, ur 30 (A) NEGATIVE mg/dL   Nitrite POSITIVE (A) NEGATIVE   Leukocytes,Ua MODERATE (A) NEGATIVE   RBC / HPF >50 0 - 5 RBC/hpf   WBC, UA >50 0 - 5 WBC/hpf   Bacteria, UA NONE SEEN NONE SEEN   Squamous Epithelial / HPF 0-5 0 - 5 /HPF   Mucus PRESENT    Hyaline Casts, UA PRESENT     Comment: Performed at [facility], [address] [place], [name] [address]  Comprehensive metabolic panel     Status: Abnormal   Collection Time: 05/14/22  7:40 AM  Result Value Ref Range   Sodium 139 135 - 145 mmol/L   Potassium 3.1 (L) 3.5 - 5.1 mmol/L  Chloride 97 (L) 98 - 111 mmol/L   CO2 29 22 - 32 mmol/L   Glucose, Bld 139 (H) 70 - 99 mg/dL    Comment: Glucose reference range applies only to samples taken after fasting for at least 8 hours.   BUN 9 8 - 23 mg/dL   Creatinine, Ser 0.76 0.44 - 1.00 mg/dL   Calcium 8.7 (L) 8.9 - 10.3 mg/dL   Total Protein 7.7 6.5 - 8.1 g/dL   Albumin 2.6 (L) 3.5 - 5.0 g/dL   AST 28 15 - 41 U/L   ALT <5 0 - 44 U/L   Alkaline Phosphatase 69 38 - 126 U/L   Total Bilirubin 0.6 0.3 - 1.2 mg/dL   GFR, Estimated >60 >60 mL/min    Comment: (NOTE) Calculated using the CKD-EPI Creatinine Equation (2021)    Anion gap 13 5 - 15    Comment: Performed at [facility], [place] [address] [place], [place] [address]  CBC with Differential     Status: Abnormal   Collection Time: 05/14/22  7:40 AM  Result Value Ref Range   WBC 5.9 4.0 - 10.5 K/uL   RBC 3.34 (L) 3.87 - 5.11 MIL/uL   Hemoglobin 10.9 (L) 12.0 - 15.0 g/dL   HCT 34.1 (L) 36.0 - 46.0 %   MCV 102.1 (H) 80.0 - 100.0 fL   MCH 32.6 26.0 - 34.0 pg   MCHC 32.0 30.0 - 36.0 g/dL   RDW 19.0 (H) 11.5 - 15.5 %   Platelets 220 150 - 400 K/uL   nRBC 0.0 0.0 - 0.2 %   Neutrophils Relative % 56 %   Neutro Abs 3.3 1.7 - 7.7 K/uL   Lymphocytes Relative 23 %   Lymphs Abs 1.4 0.7 - 4.0 K/uL   Monocytes Relative 19 %   Monocytes Absolute 1.1 (H) 0.1 - 1.0 K/uL    Eosinophils Relative 2 %   Eosinophils Absolute 0.1 0.0 - 0.5 K/uL   Basophils Relative 0 %   Basophils Absolute 0.0 0.0 - 0.1 K/uL   Immature Granulocytes 0 %   Abs Immature Granulocytes 0.02 0.00 - 0.07 K/uL    Comment: Performed at [facility], [place] [address], [place], [place] [address]  Magnesium     Status: Abnormal   Collection Time: 05/14/22  7:40 AM  Result Value Ref Range   Magnesium 1.4 (L) 1.7 - 2.4 mg/dL    Comment: Performed at [facility] [address], [place], [place] [address]  Brain natriuretic peptide     Status: Abnormal   Collection Time: 05/14/22  7:40 AM  Result Value Ref Range   B Natriuretic Peptide 907.2 (H) 0.0 - 100.0 pg/mL    Comment: Performed at [facility] [address], [place], [place] [address]  CBG monitoring, ED     Status: Abnormal   Collection Time: 05/14/22  7:50 AM  Result Value Ref Range   Glucose-Capillary 126 (H) 70 - 99 mg/dL    Comment: Glucose reference range applies only to samples taken after fasting for at least 8 hours.   *Note: Due to a large number of results and/or encounters for the requested time period, some results have not been displayed. A complete set of results can be found in Results Review.   DG Hip Unilat With Pelvis 2-3 Views Left  Result Date: 05/14/2022 CLINICAL DATA:  Cough, weakness, hip pain EXAM: DG HIP (WITH OR WITHOUT PELVIS) 2-3V LEFT COMPARISON:  None Available. FINDINGS: No acute fracture or dislocation. No aggressive osseous lesion. Normal alignment. Mild osteoarthritis of the left hip. Generalized osteopenia. Soft tissue are unremarkable. No radiopaque foreign body or soft tissue emphysema. IMPRESSION: 1. Mild osteoarthritis of the left hip. Given the patient's age and osteopenia, if there is persistent clinical concern for an occult hip fracture, a MRI of the hip is recommended for increased sensitivity. Electronically Signed   By: Kathreen Devoid M.D.   On: 05/14/2022 09:35   DG Chest  Port 1 View  Result Date: 05/14/2022 CLINICAL DATA:  Cough, pain EXAM: PORTABLE CHEST 1 VIEW COMPARISON:  04/25/2022 FINDINGS: Bilateral interstitial thickening. Small bilateral pleural effusions. Bibasilar atelectasis. No pneumothorax. Stable cardiomegaly. Prior TAVR. Dual lead cardiac pacemaker. No acute osseous abnormality. IMPRESSION: 1. Findings concerning for CHF. Electronically Signed   By: Kathreen Devoid M.D.   On: 05/14/2022 09:34    Pending Labs Unresulted Labs (From admission, onward)     Start     Ordered   05/21/22 0500  Creatinine, serum  (enoxaparin (LOVENOX)    CrCl >/= 30 ml/min)  Weekly,   R     Comments: while on enoxaparin therapy    05/14/22 1536   05/15/22 [id number]  Basic metabolic panel  Daily,   R     Comments: As Scheduled for 5 days    05/14/22 1536            Vitals/Pain Today's Vitals   05/14/22 1300 05/14/22 1430 05/14/22 1445 05/14/22 1530  BP: (!) 148/113 (!) 146/59  (!) 129/59  Pulse: 65  65   Resp: (!) 22  (!) 22 (!) 23  Temp:      TempSrc:      SpO2: 95%  100% 99%  Weight:      Height:      PainSc:        Isolation Precautions No active isolations  Medications Medications  allopurinol (ZYLOPRIM) tablet 100 mg (100 mg Oral Given 05/14/22 1550)  cefadroxil (DURICEF) capsule 1,000 mg (1,000 mg Oral Given 05/14/22 1550)  ezetimibe (ZETIA) tablet 10 mg (10 mg Oral Given 05/14/22 1550)  sodium chloride flush (NS) 0.9 % injection 3 mL (3 mLs Intravenous Not Given 05/14/22 1551)  sodium chloride flush (NS) 0.9 % injection 3 mL (has no administration in time range)  0.9 %  sodium chloride infusion (has no administration in time range)  acetaminophen (TYLENOL) tablet 650 mg (has no administration in time range)  ondansetron (ZOFRAN) injection 4 mg (has no administration in time range)  furosemide (LASIX) injection 40 mg (40 mg Intravenous Given 05/14/22 1550)  apixaban (ELIQUIS) tablet 5 mg (has no administration in time range)  B-complex with vitamin  C tablet 1 tablet (has no administration in time range)  levothyroxine (SYNTHROID) tablet 75 mcg (has no administration in time range)  metFORMIN (GLUCOPHAGE) tablet 500 mg (has no administration in time range)  metoprolol tartrate (LOPRESSOR) tablet 25 mg (has no administration in time range)  rosuvastatin (CRESTOR) tablet 10 mg (has no administration in time range)  sacubitril-valsartan (ENTRESTO) 97-103 mg per tablet (has no administration in time range)  empagliflozin (JARDIANCE) tablet 10 mg (has no administration in time range)  insulin aspart (novoLOG) injection 0-20 Units (has no administration in time range)  magnesium sulfate IVPB 2 g 50 mL (0 g Intravenous Stopped 05/14/22 1130)  potassium chloride 10 mEq in 100 mL IVPB (0 mEq Intravenous Stopped 05/14/22 1120)    Mobility walks with person assist  Focused Assessments Cardiac Assessment Handoff:    Lab Results  Component Value Date   CKTOTAL 104 04/20/2022   TROPONINI 0.12 ([place]) 06/23/2017   Lab Results  Component Value Date   DDIMER 0.33 04/20/2022   Does the Patient currently have chest pain? No   , Neuro Assessment Handoff:  Swallow screen pass? Yes          Neuro Assessment: Exceptions to [facility] Neuro Checks:      Has TPA been given? No If patient is a Neuro Trauma and patient is going to OR before floor call report to [place] nurse: [phone] or [phone]  , Pulmonary Assessment Handoff:  Lung sounds:   O2 Device: Room Air      R Recommendations: See Admitting Provider Note  Report given to:   Additional Notes: na

## 2022-05-14 NOTE — ED Notes (Signed)
This tech along with another tech attempted to ambulate this patient, patient unable to ambulate. Very weak, pt complaining of shortness of breath during exertion of repositioning and attempting to stand. PA Lauren notified.

## 2022-05-14 NOTE — Assessment & Plan Note (Signed)
Per old record patient with dementia but not on medication other than anti-depressants. She answers questions but lacks insight to her problems. Unlikely that she can manage complex medical regimen on her own. At 89 unlikely to benefit for medication interventions.

## 2022-05-15 DIAGNOSIS — K219 Gastro-esophageal reflux disease without esophagitis: Secondary | ICD-10-CM

## 2022-05-15 DIAGNOSIS — I10 Essential (primary) hypertension: Secondary | ICD-10-CM | POA: Diagnosis not present

## 2022-05-15 DIAGNOSIS — I5033 Acute on chronic diastolic (congestive) heart failure: Secondary | ICD-10-CM | POA: Diagnosis not present

## 2022-05-15 DIAGNOSIS — E876 Hypokalemia: Secondary | ICD-10-CM

## 2022-05-15 DIAGNOSIS — Z66 Do not resuscitate: Secondary | ICD-10-CM

## 2022-05-15 DIAGNOSIS — Z8679 Personal history of other diseases of the circulatory system: Secondary | ICD-10-CM | POA: Diagnosis not present

## 2022-05-15 DIAGNOSIS — I4892 Unspecified atrial flutter: Secondary | ICD-10-CM

## 2022-05-15 DIAGNOSIS — I4891 Unspecified atrial fibrillation: Secondary | ICD-10-CM

## 2022-05-15 DIAGNOSIS — E118 Type 2 diabetes mellitus with unspecified complications: Secondary | ICD-10-CM | POA: Diagnosis not present

## 2022-05-15 DIAGNOSIS — F03918 Unspecified dementia, unspecified severity, with other behavioral disturbance: Secondary | ICD-10-CM

## 2022-05-15 LAB — GLUCOSE, CAPILLARY
Glucose-Capillary: 98 mg/dL (ref 70–99)
Glucose-Capillary: 159 mg/dL — ABNORMAL HIGH (ref 70–99)
Glucose-Capillary: 90 mg/dL (ref 70–99)
Glucose-Capillary: 118 mg/dL — ABNORMAL HIGH (ref 70–99)

## 2022-05-15 LAB — BASIC METABOLIC PANEL
Anion gap: 12 (ref 5–15)
BUN: 10 mg/dL (ref 8–23)
CO2: 32 mmol/L (ref 22–32)
Calcium: 8.4 mg/dL — ABNORMAL LOW (ref 8.9–10.3)
Chloride: 94 mmol/L — ABNORMAL LOW (ref 98–111)
Creatinine, Ser: 0.92 mg/dL (ref 0.44–1.00)
GFR, Estimated: >60 mL/min (ref >60)
Glucose, Bld: 107 mg/dL — ABNORMAL HIGH (ref 70–99)
Potassium: 2.8 mmol/L — ABNORMAL LOW (ref 3.5–5.1)
Sodium: 138 mmol/L (ref 135–145)

## 2022-05-15 LAB — MAGNESIUM: Magnesium: 1.7 mg/dL (ref 1.7–2.4)

## 2022-05-15 MED ORDER — POTASSIUM CHLORIDE CRYS ER 20 MEQ PO TBCR
40.0000 meq | EXTENDED_RELEASE_TABLET | ORAL | Status: AC
  Administered 2022-05-15 (×3): 40 meq via ORAL
  Filled 2022-05-15 (×3): qty 2

## 2022-05-15 MED ORDER — FUROSEMIDE 10 MG/ML IJ SOLN
60.0000 mg | Freq: Two times a day (BID) | INTRAMUSCULAR | Status: DC
  Administered 2022-05-15 – 2022-05-16 (×2): 60 mg via INTRAVENOUS
  Filled 2022-05-15 (×2): qty 6

## 2022-05-15 MED ORDER — MAGNESIUM SULFATE 2 GM/50ML IV SOLN
2.0000 g | Freq: Once | INTRAVENOUS | Status: AC
  Administered 2022-05-15: 2 g via INTRAVENOUS
  Filled 2022-05-15: qty 50

## 2022-05-15 NOTE — Hospital Course (Addendum)
Jessica [name] was admitted to the hospital with the working diagnosis of heart failure decompensation.   89 yo female with the past medical history of T2DM, dyslipidemia, hypertension, atrial fibrillation, AV block sp pacemaker, aortic stenosis sp TAVR complicated with aortic valve endocarditis, and heart failure who presented with weakness. Recent hospitalization 01/16 to 04/24/22 for hypotension and AKI in the setting of COVID 19 infection. At home patient had progressive weakness and difficulty ambulating. Positive dyspnea at rest and on exertion. On her initial physical examination her blood pressure was 148/113, RR 22 and HE 65, luns with rales bilaterally with decrease breath sounds at the left base, no wheezing, heart with S1 and S2 present and rhythmic, abdomen with no distention and positive lower extremity edema.   Na 139, K 3,1 CL 97 bicarbonate 29, glucose 139 bun 9 cr 0,76  Mg 1,4  BNP 907  Wbc 5,9 hgb 10,9 plt 220   Urine analysis SG 1,013, 30 protein, >50 wbc,   EKG 68 bpm, left axis deviation, left bundle brunch, atrial sensing and ventricular pacing, positive PVC, with no significant ST segment or T wave changes.   Chest radiograph with cardiomegaly with bilateral hilar vascular congestion, cephalization of the vasculature and bilateral pleural effusions. Pacemaker in place with one atrial and one ventricular lead.   02/13 patient has been placed on IV furosemide with improvement in her symptoms.

## 2022-05-15 NOTE — Progress Notes (Signed)
Patient's potassium level is 2.8. MD notified.

## 2022-05-15 NOTE — Assessment & Plan Note (Signed)
Patient with ventricular paced rhythm,  Continue with metoprolol for rate control and anticoagulation with apixaban.

## 2022-05-15 NOTE — Progress Notes (Addendum)
Progress Note   Patient: Jessica [name] [id number] DOB: 04-29-1933 DOA: 05/14/2022     1 DOS: the patient was seen and examined on 05/15/2022   Brief hospital course: Mrs. Yance was admitted to the hospital with the working diagnosis of heart failure decompensation.   89 yo female with the past medical history of T2DM, dyslipidemia, hypertension, atrial fibrillation, AV block sp pacemaker, aortic stenosis sp TAVR complicated with aortic valve endocarditis, and heart failure who presented with weakness. Recent hospitalization 01/16 to 04/24/22 for hypotension and AKI in the setting of COVID 19 infection. At home patient had progressive weakness and difficulty ambulating. Positive dyspnea at rest and on exertion. On her initial physical examination her blood pressure was 148/113, RR 22 and HE 65, luns with rales bilaterally with decrease breath sounds at the left base, no wheezing, heart with S1 and S2 present and rhythmic, abdomen with no distention and positive lower extremity edema.   Na 139, K 3,1 CL 97 bicarbonate 29, glucose 139 bun 9 cr 0,76  Mg 1,4  BNP 907  Wbc 5,9 hgb 10,9 plt 220   Urine analysis SG 1,013, 30 protein, >50 wbc,   EKG 68 bpm, left axis deviation, left bundle brunch, atrial sensing and ventricular pacing, positive PVC, with no significant ST segment or T wave changes.   Chest radiograph with cardiomegaly with bilateral hilar vascular congestion, cephalization of the vasculature and bilateral pleural effusions. Pacemaker in place with one atrial and one ventricular lead.    Assessment and Plan: * Acute on chronic diastolic CHF (congestive heart failure) (HCC) Echocardiogram with preserved LV systolic function with EF 55 to 60%, moderate asymmetric left ventricular hypertrophy of the basal septal segment. Preserved RV systolic function. RVSP 35.2. SP TAVR  Urine output is documented [id number] Systolic blood pressure [id number] to 118 mmHg.   Plan to continue diuresis  with furosemide, will increase dose to 60 mg IV q12 Continue empagliflozin and entresto. If blood pressure and renal function stable will add spironolactone.  Continue with metoprolol.   History of endocarditis Recent hospitalization for endocarditits - 3 cm vegetation on TVR prosthesis by TEE1/3/24. She has completed course of IV ancef and is on oral abx indefinitely. CBCD nl, no fever  Plan Continue PO  abx.   Essential hypertension Continue blood pressure control with entreto and diuresis with furosemide. Metoprolol.   Hypokalemia Renal function with serum cr at 0,92, with K at 2,8 and serum bicarbonate at 32. Na 132 and Mg 1,7  Continue K correction with oral Kcl 80 meq in 2 divided doses. Add 2 g mag sulfate.  Follow up renal function and electrolytes in am.   DM type 2, controlled, with complication ([place]) Continue glucose cover and monitoring with insulin sliding scale. Patient is tolerating po well.   Dementia with behavioral disturbance ([place]) Continue neuro checks per unit protocol Pt and Ot   Atrial fibrillation and flutter ([place]) Patient with ventricular paced rhythm,  Continue with metoprolol for rate control and anticoagulation with apixaban.   GERD (gastroesophageal reflux disease) Denies discomfort or GI symptoms  Plan PPI daily  Hypothyroidism Last TSH 03/28/22 1.46  Plan Continue present dose of synthroid.   DNR (do not resuscitate) Per old record.  Will continue.       Subjective: Patient with no chest pain, dyspnea and edema have been improving, but not back to baseline, limited history due to cognitive impairment   Physical Exam: Vitals:   05/15/22 0604 05/15/22 0717 05/15/22  1048 05/15/22 1551  BP: 105/73 (!) 121/46 (!) 112/54 (!) 118/56  Pulse: 62  62 72  Resp: 18 18  18  $ Temp: 97.7 F (36.5 C) 97.9 F (36.6 C) 97.7 F (36.5 C) 97.9 F (36.6 C)  TempSrc: Oral Oral Oral Oral  SpO2:  98% 100% 97%  Weight: 106.5 kg     Height:        Neurology awake and alert ENT With mild pallor Cardiovascular with S1 and S2 present and rhythmic with mild systolic murmur at the apex No gallops, or rubs Respiratory with wheezing, mild rales  Abdomen with no distention  Positive lower extremity edema  Data Reviewed:    Family Communication: no family at the bedside.  I spoke with patient's aunt over the phone, we talked in detail about patient's condition, plan of care and prognosis and all questions were addressed.   Disposition: Status is: Inpatient Remains inpatient appropriate because: IV diuresis,   Planned Discharge Destination: snf      Author: [name], MD 05/15/2022 4:08 PM  For on call review [email or web address].

## 2022-05-15 NOTE — Evaluation (Signed)
Physical Therapy Evaluation Patient Details Name: Jessica [name] MRN: [id number] DOB: 1934/03/09 Today's Date: 05/15/2022  History of Present Illness  89 y.o. female adm 2/11 presents to the emergency department with weakness. PMH: type 2 diabetes, HLD, HTN, atrial fibrillation anticoagulated on Eliquis, AV block with pacemaker, aortic stenosis s/p TAVR, CHF, recent admissions for MSSA bacteremia and TAVR vegetation  Clinical Impression  Pt admitted with above diagnosis. Pt was only able to take a few steps with RW and mod assist of 2 and needed close chair follow as pt sits abruptly without warning.  Pt states she fatigues quickly.  Pt with very poor safety awareness.  Pt will need to return to SNF for therapy to progress as able. Pt currently with functional limitations due to the deficits listed below (see PT Problem List). Pt will benefit from skilled PT to increase their independence and safety with mobility to allow discharge to the venue listed below.          Recommendations for follow up therapy are one component of a multi-disciplinary discharge planning process, led by the attending physician.  Recommendations may be updated based on patient status, additional functional criteria and insurance authorization.  Follow Up Recommendations Skilled nursing-short term rehab (<3 hours/day) Can patient physically be transported by private vehicle: No    Assistance Recommended at Discharge Frequent or constant Supervision/Assistance  Patient can return home with the following  A lot of help with walking and/or transfers;Assistance with cooking/housework;Direct supervision/assist for medications management;Direct supervision/assist for financial management;Assist for transportation;Help with stairs or ramp for entrance    Equipment Recommendations Other (comment) (defer to next level of care)  Recommendations for Other Services       Functional Status Assessment Patient has had a recent  decline in their functional status and demonstrates the ability to make significant improvements in function in a reasonable and predictable amount of time.     Precautions / Restrictions Precautions Precautions: Fall Restrictions Weight Bearing Restrictions: No      Mobility  Bed Mobility               General bed mobility comments: Pt up in chair on arrival    Transfers Overall transfer level: Needs assistance Equipment used: Rolling walker (2 wheels) Transfers: Sit to/from Stand, Bed to chair/wheelchair/BSC Sit to Stand: Mod assist           General transfer comment: Pt needed cues for hand and foot placement as she has poor motor planning for tasks and needs constant cues for tasks.  Once up needed steadying assist as pt quickly fatigues and flexes at trunk and knees and sits with little warning.  Pt needed sequencing through verbal cues    Ambulation/Gait Ambulation/Gait assistance: Mod assist, +2 physical assistance Gait Distance (Feet): 3 Feet Assistive device: Rolling walker (2 wheels) Gait Pattern/deviations: Decreased step length - right, Decreased step length - left, Shuffle, Knee flexed in stance - right, Knee flexed in stance - left, Trunk flexed, Leaning posteriorly   Gait velocity interpretation: <1.31 ft/sec, indicative of household ambulator   General Gait Details: Attempts made x 5 to ambulate however pt intiially could not progress more than 1 step each foot as she starts and then just would sit back on the recliner chair (close chair follow by pts' niece).  Pt fatigues quickly and also appears to have decr motor processing as well.  Very poor safety awareness as well.  Stairs  Wheelchair Mobility    Modified Rankin (Stroke Patients Only)       Balance Overall balance assessment: Needs assistance Sitting-balance support: Feet supported, No upper extremity supported Sitting balance-Leahy Scale: Good Sitting balance - Comments:  min guard to close supervision   Standing balance support: Bilateral upper extremity supported, During functional activity Standing balance-Leahy Scale: Poor Standing balance comment: assist provided for safety and balance, pt with limited standing tolerance                             Pertinent Vitals/Pain Pain Assessment Pain Assessment: No/denies pain    Home Living Family/patient expects to be discharged to:: Private residence Living Arrangements: Alone Available Help at Discharge: Family;Friend(s);Available PRN/intermittently Type of Home: House Home Access: Stairs to enter Entrance Stairs-Rails: Left Entrance Stairs-Number of Steps: 2 Alternate Level Stairs-Number of Steps: pt reports 3 story house but stay on main level Home Layout: Multi-level;Able to live on main level with bedroom/bathroom Home Equipment: Rolling Walker (2 wheels);Cane - single point;BSC/3in1;Wheelchair - manual;Shower seat      Prior Function Prior Level of Function : Patient poor historian/Family not available             Mobility Comments: pt reports utilizing RW at home ADLs Comments: pt reports independence with dressing, bathing, and cooking. Pt stopped driving 6 months ago, neighbors/family take her to appointments/grocery store.  Recent SNF admit.     Hand Dominance   Dominant Hand: Right    Extremity/Trunk Assessment   Upper Extremity Assessment Upper Extremity Assessment: Defer to OT evaluation    Lower Extremity Assessment Lower Extremity Assessment: Generalized weakness    Cervical / Trunk Assessment Cervical / Trunk Assessment: Normal Cervical / Trunk Exceptions: increased body habitus  Communication   Communication: No difficulties  Cognition Arousal/Alertness: Awake/alert Behavior During Therapy: WFL for tasks assessed/performed Overall Cognitive Status: History of cognitive impairments - at baseline Area of Impairment: Attention, Memory, Following  commands, Safety/judgement, Problem solving                 Orientation Level: Disoriented to, Situation Current Attention Level: Selective Memory: Decreased short-term memory Following Commands: Follows one step commands inconsistently, Follows one step commands with increased time Safety/Judgement: Decreased awareness of safety, Decreased awareness of deficits Awareness: Emergent Problem Solving: Requires verbal cues General Comments: Pt oriented to self, time, and place,        General Comments General comments (skin integrity, edema, etc.): 66 bpm, 112/54;    Exercises General Exercises - Lower Extremity Ankle Circles/Pumps: AROM, Both, 10 reps, Supine Long Arc Quad: AROM, Both, 10 reps, Seated   Assessment/Plan    PT Assessment Patient needs continued PT services  PT Problem List Decreased strength;Decreased activity tolerance;Decreased balance;Decreased mobility;Decreased safety awareness       PT Treatment Interventions DME instruction;Gait training;Functional mobility training;Therapeutic activities;Therapeutic exercise;Balance training;Neuromuscular re-education;Patient/family education    PT Goals (Current goals can be found in the Care Plan section)  Acute Rehab PT Goals Patient Stated Goal: get better PT Goal Formulation: With patient Time For Goal Achievement: 05/29/22 Potential to Achieve Goals: Fair    Frequency Min 2X/week     Co-evaluation               AM-PAC PT "6 Clicks" Mobility  Outcome Measure Help needed turning from your back to your side while in a flat bed without using bedrails?: A Lot Help needed moving from lying on your back  to sitting on the side of a flat bed without using bedrails?: A Lot Help needed moving to and from a bed to a chair (including a wheelchair)?: Total Help needed standing up from a chair using your arms (e.g., wheelchair or bedside chair)?: A Lot Help needed to walk in hospital room?: Total Help needed  climbing 3-5 steps with a railing? : Total 6 Click Score: 9    End of Session Equipment Utilized During Treatment: Gait belt Activity Tolerance: Patient limited by fatigue Patient left: in chair;with chair alarm set;with call bell/phone within reach;with family/visitor present Nurse Communication: Mobility status;Need for lift equipment Charlaine Dalton) PT Visit Diagnosis: Unsteadiness on feet (R26.81);Muscle weakness (generalized) (M62.81)    Time: [id number] PT Time Calculation (min) (ACUTE ONLY): 29 min   Charges:   PT Evaluation $PT Eval Moderate Complexity: 1 Mod PT Treatments $Gait Training: 8-22 mins        [facility] M,PT Acute Rehab Services [phone]   Alvira Philips 05/15/2022, 3:23 PM

## 2022-05-15 NOTE — Assessment & Plan Note (Signed)
Renal function with serum cr at 0,92, with K at 2,8 and serum bicarbonate at 32. Na 132 and Mg 1,7  Continue K correction with oral Kcl 80 meq in 2 divided doses. Add 2 g mag sulfate.  Follow up renal function and electrolytes in am.

## 2022-05-15 NOTE — Progress Notes (Signed)
Heart Failure Navigator Progress Note  Assessed for Heart & Vascular TOC clinic readiness.  Patient EF 55-60%, No TOC per Dr. Cathlean Sauer. .   Navigator will sign off at this time.   Earnestine Leys, BSN, Clinical cytogeneticist Only

## 2022-05-15 NOTE — Evaluation (Signed)
Occupational Therapy Evaluation Patient Details Name: Jessica [name] MRN: [id number] DOB: 1933-12-12 Today's Date: 05/15/2022   History of Present Illness 89 y.o. female adm 2/11 with past medical history of type 2 diabetes, HLD, HTN, atrial fibrillation anticoagulated on Eliquis, AV block with pacemaker, aortic stenosis s/p TAVR, CHF, recent admissions for MSSA bacteremia and TAVR vegetation now on cefadroxil indefinitely who presents to the emergency department with weakness.   Clinical Impression   Patient admitted for the diagnosis above.  PTA sh lives alone, and has PRN assist for community mobility.  Patient is a poor historian, but states she normally walks with a RW, sponge bathes, and cares for her own meals.  Patient with multiple hospitalizations, and does not appear to have adequate assist at home, she admits to two recent falls where EMS had to be called.  OT to follow in the acute setting to address deficits noted, and assist with transition to SNF for post acute rehab.  Patient is probably appropriate for LTC at a local SNF.  Currently bed level for lower body ADL, and Max A for basic transfers.        Recommendations for follow up therapy are one component of a multi-disciplinary discharge planning process, led by the attending physician.  Recommendations may be updated based on patient status, additional functional criteria and insurance authorization.   Follow Up Recommendations  Skilled nursing-short term rehab (<3 hours/day)     Assistance Recommended at Discharge Frequent or constant Supervision/Assistance  Patient can return home with the following Assistance with cooking/housework;Direct supervision/assist for medications management;Direct supervision/assist for financial management;Help with stairs or ramp for entrance;Assist for transportation;A lot of help with bathing/dressing/bathroom;A lot of help with walking and/or transfers    Functional Status Assessment   Patient has had a recent decline in their functional status and demonstrates the ability to make significant improvements in function in a reasonable and predictable amount of time.  Equipment Recommendations  None recommended by OT    Recommendations for Other Services       Precautions / Restrictions Precautions Precautions: Fall Restrictions Weight Bearing Restrictions: No      Mobility Bed Mobility Overal bed mobility: Needs Assistance Bed Mobility: Supine to Sit     Supine to sit: Mod assist, HOB elevated     General bed mobility comments: use of bed rails, pt requiring verbal cues for sequencing and getting BLE off bed, assist for pulling up into sitting    Transfers Overall transfer level: Needs assistance Equipment used: Rolling walker (2 wheels) Transfers: Sit to/from Stand, Bed to chair/wheelchair/BSC Sit to Stand: Mod assist Stand pivot transfers: Max assist                Balance Overall balance assessment: Needs assistance Sitting-balance support: Feet supported, No upper extremity supported Sitting balance-Leahy Scale: Good     Standing balance support: Bilateral upper extremity supported, During functional activity Standing balance-Leahy Scale: Poor                             ADL either performed or assessed with clinical judgement   ADL   Eating/Feeding: Bed level;Set up   Grooming: Wash/dry face;Set up;Bed level   Upper Body Bathing: Minimal assistance;Sitting   Lower Body Bathing: Maximal assistance;Bed level   Upper Body Dressing : Minimal assistance;Sitting   Lower Body Dressing: Maximal assistance;Bed level  Vision   Vision Assessment?: No apparent visual deficits     Perception     Praxis      Pertinent Vitals/Pain Pain Assessment Pain Assessment: No/denies pain Pain Intervention(s): Monitored during session     Hand Dominance Right   Extremity/Trunk Assessment Upper  Extremity Assessment Upper Extremity Assessment: Generalized weakness   Lower Extremity Assessment Lower Extremity Assessment: Defer to PT evaluation   Cervical / Trunk Assessment Cervical / Trunk Assessment: Normal Cervical / Trunk Exceptions: increased body habitus   Communication Communication Communication: No difficulties   Cognition Arousal/Alertness: Awake/alert Behavior During Therapy: WFL for tasks assessed/performed Overall Cognitive Status: History of cognitive impairments - at baseline                                       General Comments   VSS on RA    Exercises     Shoulder Instructions      Home Living Family/patient expects to be discharged to:: Private residence Living Arrangements: Alone Available Help at Discharge: Family;Friend(s);Available PRN/intermittently Type of Home: House Home Access: Stairs to enter [employer] of Steps: 2 Entrance Stairs-Rails: Left Home Layout: Multi-level;Able to live on main level with bedroom/bathroom Alternate Level Stairs-Number of Steps: pt reports 3 story house but stay on main level   Bathroom Shower/Tub: Sponge bathes at baseline   Bathroom Toilet: Standard Bathroom Accessibility: Yes How Accessible: Accessible via walker Home Equipment: [place] (2 wheels);Cane - single point          Prior Functioning/Environment Prior Level of Function : Patient poor historian/Family not available             Mobility Comments: pt reports utilizing RW at home ADLs Comments: pt reports independence with dressing, bathing, and cooking. Pt stopped driving 6 months ago, neighbors/family take her to appointments/grocery store.  Recent SNF admit.        OT Problem List: Decreased strength;Decreased activity tolerance;Impaired balance (sitting and/or standing);Decreased cognition;Decreased safety awareness;Decreased knowledge of use of DME or AE;Pain;Cardiopulmonary status limiting  activity;Obesity      OT Treatment/Interventions: Self-care/ADL training;Therapeutic exercise;DME and/or AE instruction;Therapeutic activities;Patient/family education;Balance training;Cognitive remediation/compensation    OT Goals(Current goals can be found in the care plan section) Acute Rehab OT Goals Patient Stated Goal: Return home OT Goal Formulation: With patient Time For Goal Achievement: 05/29/22 Potential to Achieve Goals: Fair ADL Goals Pt Will Perform Grooming: with set-up;sitting Pt Will Perform Lower Body Dressing: sit to/from stand;with mod assist Pt Will Transfer to Toilet: with min assist;stand pivot transfer;bedside commode Pt/caregiver will Perform Home Exercise Program: Increased strength;Both right and left upper extremity;With theraband;With Supervision  OT Frequency: Min 2X/week    Co-evaluation              AM-PAC OT "6 Clicks" Daily Activity     Outcome Measure Help from another person eating meals?: None Help from another person taking care of personal grooming?: None Help from another person toileting, which includes using toliet, bedpan, or urinal?: Total Help from another person bathing (including washing, rinsing, drying)?: A Lot Help from another person to put on and taking off regular upper body clothing?: A Little Help from another person to put on and taking off regular lower body clothing?: A Lot 6 Click Score: 16   End of Session Equipment Utilized During Treatment: Oxygen;Rolling walker (2 wheels) Nurse Communication: Mobility status  Activity Tolerance: Patient tolerated treatment  well Patient left: in chair;with call bell/phone within reach;with nursing/sitter in room  OT Visit Diagnosis: Other abnormalities of gait and mobility (R26.89);Muscle weakness (generalized) (M62.81);Dizziness and giddiness (R42)                Time: [id number] OT Time Calculation (min): 17 min Charges:  OT General Charges $OT Visit: 1 Visit OT  Evaluation $OT Eval Moderate Complexity: 1 Mod  05/15/2022  RP, OTR/L  Acute Rehabilitation Services  Office:  [phone]   Metta Clines 05/15/2022, 9:38 AM

## 2022-05-15 NOTE — Progress Notes (Addendum)
   05/15/22 1345  Spiritual Encounters  Type of Visit Initial  Care provided to: Patient;Family  Conversation partners present during encounter Nurse  Referral source Patient request  Reason for visit Advance directives  OnCall Visit No  Interventions  Spiritual Care Interventions Made Encouragement   Pt requested ACD to make her niece her medical power of attorney.  Paperwork filed out, just waiting on a notary to sign it.  No notary available to our department today. Will pass on for staff chaplain to carry out tomorrow. Pts niece is Cresenciano Lick [phone]

## 2022-05-16 ENCOUNTER — Other Ambulatory Visit (HOSPITAL_COMMUNITY): Payer: Self-pay

## 2022-05-16 ENCOUNTER — Telehealth (HOSPITAL_COMMUNITY): Payer: Self-pay

## 2022-05-16 DIAGNOSIS — I10 Essential (primary) hypertension: Secondary | ICD-10-CM | POA: Diagnosis not present

## 2022-05-16 DIAGNOSIS — I5033 Acute on chronic diastolic (congestive) heart failure: Secondary | ICD-10-CM | POA: Diagnosis not present

## 2022-05-16 DIAGNOSIS — E118 Type 2 diabetes mellitus with unspecified complications: Secondary | ICD-10-CM | POA: Diagnosis not present

## 2022-05-16 DIAGNOSIS — Z8679 Personal history of other diseases of the circulatory system: Secondary | ICD-10-CM | POA: Diagnosis not present

## 2022-05-16 LAB — GLUCOSE, CAPILLARY
Glucose-Capillary: 127 mg/dL — ABNORMAL HIGH (ref 70–99)
Glucose-Capillary: 129 mg/dL — ABNORMAL HIGH (ref 70–99)
Glucose-Capillary: 108 mg/dL — ABNORMAL HIGH (ref 70–99)
Glucose-Capillary: 133 mg/dL — ABNORMAL HIGH (ref 70–99)

## 2022-05-16 LAB — MAGNESIUM: Magnesium: 1.9 mg/dL (ref 1.7–2.4)

## 2022-05-16 LAB — BASIC METABOLIC PANEL
Anion gap: 12 (ref 5–15)
BUN: 11 mg/dL (ref 8–23)
CO2: 30 mmol/L (ref 22–32)
Calcium: 8.9 mg/dL (ref 8.9–10.3)
Chloride: 94 mmol/L — ABNORMAL LOW (ref 98–111)
Creatinine, Ser: 0.97 mg/dL (ref 0.44–1.00)
GFR, Estimated: 57 mL/min — ABNORMAL LOW (ref >60)
Glucose, Bld: 174 mg/dL — ABNORMAL HIGH (ref 70–99)
Potassium: 3.4 mmol/L — ABNORMAL LOW (ref 3.5–5.1)
Sodium: 136 mmol/L (ref 135–145)

## 2022-05-16 MED ORDER — VENLAFAXINE HCL ER 75 MG PO CP24
75.0000 mg | ORAL_CAPSULE | Freq: Every day | ORAL | Status: DC
  Administered 2022-05-17 – 2022-05-19 (×3): 75 mg via ORAL
  Filled 2022-05-16 (×3): qty 1

## 2022-05-16 MED ORDER — FUROSEMIDE 40 MG PO TABS
40.0000 mg | ORAL_TABLET | Freq: Every day | ORAL | Status: DC
  Administered 2022-05-17: 40 mg via ORAL
  Filled 2022-05-16: qty 1

## 2022-05-16 MED ORDER — POTASSIUM CHLORIDE CRYS ER 20 MEQ PO TBCR
40.0000 meq | EXTENDED_RELEASE_TABLET | ORAL | Status: AC
  Administered 2022-05-16 (×2): 40 meq via ORAL
  Filled 2022-05-16 (×2): qty 2

## 2022-05-16 MED ORDER — FUROSEMIDE 40 MG PO TABS: 60.0000 mg | ORAL_TABLET | Freq: Every day | ORAL | Status: DC

## 2022-05-16 MED ORDER — SPIRONOLACTONE 12.5 MG HALF TABLET
12.5000 mg | ORAL_TABLET | Freq: Every day | ORAL | Status: DC
  Administered 2022-05-17 – 2022-05-19 (×3): 12.5 mg via ORAL
  Filled 2022-05-16 (×3): qty 1

## 2022-05-16 MED ORDER — MAGNESIUM SULFATE 2 GM/50ML IV SOLN
2.0000 g | Freq: Once | INTRAVENOUS | Status: AC
  Administered 2022-05-16: 2 g via INTRAVENOUS
  Filled 2022-05-16: qty 50

## 2022-05-16 MED ORDER — SPIRONOLACTONE 12.5 MG HALF TABLET: 12.5000 mg | ORAL_TABLET | Freq: Every day | ORAL | Status: DC

## 2022-05-16 MED ORDER — FUROSEMIDE 40 MG PO TABS: 40.0000 mg | ORAL_TABLET | Freq: Every day | ORAL | Status: DC

## 2022-05-16 NOTE — NC FL2 (Signed)
[place] LEVEL OF CARE FORM     IDENTIFICATION  Patient Name: Jessica [name] Birthdate: 1933-07-17 Sex: female Admission Date (Current Location): 05/14/2022  [facility] and [place] Number:  Jessica [name] and Address:  Jessica [name]. [name] [place] [address], [place], [place] [address]      Provider Number: [id number]  Attending Physician Name and Address:  Jessica [name],*  Relative Name and Phone Number:  Jessica [name] [phone]    Current Level of Care: SNF Recommended Level of Care: Jessica [name] Prior Approval Number:    Date Approved/Denied:   PASRR Number: [id number]  Discharge Plan: SNF    Current Diagnoses: Patient Active Problem List   Diagnosis Date Noted   Hypokalemia 05/15/2022   Acute on chronic diastolic CHF (congestive heart failure) ([place]) 05/14/2022   ABLA (acute blood loss anemia) 04/20/2022   Elevated INR 04/20/2022   Prolonged QT interval 04/20/2022   Delirium 04/19/2022   History of bacteremia 04/18/2022   COVID-19 virus infection 04/18/2022   COPD with acute exacerbation ([place]) 04/18/2022   Dementia with behavioral disturbance ([place]) 04/18/2022   MSSA bacteremia 04/04/2022   Prosthetic valve endocarditis ([place]) 04/04/2022   Pacemaker infection ([place]) 04/04/2022   Sepsis ([place]) 03/28/2022   Altered mental status 03/28/2022   Hypovolemia 03/28/2022   AKI (acute kidney injury) ([place]) 03/28/2022   Severe sepsis with acute organ dysfunction ([place]) 03/28/2022   DNR (do not resuscitate) 03/28/2022   Hypotension 03/28/2022   Rectal bleeding    Pure hypercholesterolemia 01/28/2021   Fall 12/22/2020   Knee contusion 12/22/2020   Eczema 09/09/2019   Vertigo 03/05/2019   S/P TAVR (transcatheter aortic valve replacement) 01/14/2019   Presence of permanent cardiac pacemaker    Pulmonary nodule    Severe aortic stenosis    Atrial fibrillation and flutter ([place]) 07/05/2017   DOE (dyspnea on exertion)  04/18/2017   Chronic venous insufficiency 12/08/2016   Erythema nodosum 11/10/2016   Paronychia of finger, left 04/13/2016   Left carotid artery stenosis 09/24/2014   Diarrhea 07/12/2013   Foot pain, left 03/18/2013   Gastrocnemius strain 03/18/2013   GERD (gastroesophageal reflux disease) 08/18/2011   History of pulmonary embolism 10/2008   DUODENITIS, WITHOUT HEMORRHAGE 06/29/2008   Diverticulosis of colon without hemorrhage 06/29/2008   DM type 2, controlled, with complication ([place]) [id number]   Low back pain 10/15/2007   OBESITY, MORBID 07/16/2007   Grieving 05/15/2007   HLD (hyperlipidemia) 03/03/2007   Hypothyroidism 11/06/2006   Anxiety disorder 11/06/2006   CARPAL TUNNEL SYNDROME 11/06/2006   Essential hypertension 11/06/2006   HIATAL HERNIA 11/06/2006   Osteoarthritis 11/06/2006   History of endocarditis 11/06/2006    Orientation RESPIRATION BLADDER Height & Weight     Self, Time, Situation, Place  O2 (2 liters) Continent, External catheter Weight: 231 lb 0.7 oz (104.8 kg) Height:  5' 2"$  (157.5 cm)  BEHAVIORAL SYMPTOMS/MOOD NEUROLOGICAL BOWEL NUTRITION STATUS      Continent Diet (See dc summary)  AMBULATORY STATUS COMMUNICATION OF NEEDS Skin   Extensive Assist Verbally Normal                       Personal Care Assistance Level of Assistance  Bathing, Feeding, Dressing Bathing Assistance: Maximum assistance Feeding assistance: Limited assistance Dressing Assistance: Maximum assistance     Functional Limitations Info  Hearing, Sight, Speech Sight Info: Impaired Hearing Info: Adequate Speech Info: Adequate    SPECIAL CARE FACTORS FREQUENCY  PT (  By licensed PT), OT (By licensed OT)     PT Frequency: 5xweek OT Frequency: 5xweel            Contractures Contractures Info: Not present    Additional Factors Info  Code Status Code Status Info: DNR Allergies Info: Coreg (Carvedilol)  Relafen (Nabumetone)  Calcium  Codeine  Lipitor (Atorvastatin)   Pacerone (Amiodarone)  Vasotec (Enalapril)  Vioxx (Rofecoxib)  Zocor (Simvastatin)           Current Medications (05/16/2022):  This is Jessica current hospital active medication list Current Facility-Administered Medications  Medication Dose Route Frequency Provider Last Rate Last Admin   0.9 %  sodium chloride infusion  250 mL Intravenous PRN Jessica [name], Jessica Knuckles, MD       acetaminophen (TYLENOL) tablet 650 mg  650 mg Oral Q4H PRN Jessica Rhymes, MD   650 mg at 05/16/22 0949   allopurinol (ZYLOPRIM) tablet 100 mg  100 mg Oral Daily Jessica Rhymes, MD   100 mg at 05/16/22 [id number]   apixaban (ELIQUIS) tablet 5 mg  5 mg Oral BID Jessica Rhymes, MD   5 mg at 05/16/22 [id number]   B-complex with vitamin C tablet 1 tablet  1 tablet Oral Daily Jessica [name], Jessica Knuckles, MD   1 tablet at 05/16/22 [id number]   cefadroxil (DURICEF) capsule 1,000 mg  1,000 mg Oral BID Jessica Rhymes, MD   1,000 mg at 05/16/22 [id number]   empagliflozin (JARDIANCE) tablet 10 mg  10 mg Oral Daily Jessica Rhymes, MD   10 mg at 05/16/22 [id number]   ezetimibe (ZETIA) tablet 10 mg  10 mg Oral Daily Jessica Rhymes, MD   10 mg at 05/16/22 [id number]   furosemide (LASIX) injection 60 mg  60 mg Intravenous BID Jessica Millers, MD   60 mg at 05/16/22 0735   insulin aspart (novoLOG) injection 0-20 Units  0-20 Units Subcutaneous TID WC Jessica [name], Jessica Knuckles, MD   3 Units at 05/16/22 0734   levothyroxine (SYNTHROID) tablet 75 mcg  75 mcg Oral Q0600 Jessica Rhymes, MD   75 mcg at 05/16/22 [id number]   magnesium sulfate IVPB 2 g 50 mL  2 g Intravenous Once Jessica [name], Jessica Picket, MD       melatonin tablet 3 mg  3 mg Oral QHS PRN Jessica Dee, MD   3 mg at 05/15/22 2133   metFORMIN (GLUCOPHAGE) tablet 500 mg  500 mg Oral BID WC Jessica [name], Jessica Knuckles, MD   500 mg at 05/16/22 0735   metoprolol tartrate (LOPRESSOR) tablet 25 mg  25 mg Oral BID Jessica Rhymes, MD   25 mg at 05/16/22 0832   ondansetron (ZOFRAN) injection 4 mg  4 mg Intravenous Q6H PRN Jessica [name],  Jessica Knuckles, MD       potassium chloride SA (KLOR-CON M) CR tablet 40 mEq  40 mEq Oral Q4H Jessica [name], Jessica Picket, MD       rosuvastatin (CRESTOR) tablet 10 mg  10 mg Oral Q M,W,F Jessica [name], Jessica Knuckles, MD   10 mg at 05/15/22 0855   sacubitril-valsartan (ENTRESTO) 97-103 mg per tablet  1 tablet Oral BID Jessica Rhymes, MD   1 tablet at 05/16/22 [id number]   sodium chloride flush (NS) 0.9 % injection 3 mL  3 mL Intravenous Q12H Jessica [name], Jessica Knuckles, MD   3 mL at 05/16/22 0834   sodium chloride flush (NS) 0.9 % injection 3 mL  3 mL Intravenous PRN Jessica [name], Jessica Knuckles, MD       [  START ON 05/17/2022] venlafaxine XR (EFFEXOR-XR) 24 hr capsule 75 mg  75 mg Oral Q breakfast Jessica [name], Jessica Picket, MD         Discharge Medications: Please see discharge summary for a list of discharge medications.  Relevant Imaging Results:  Relevant Lab Results:   Additional Information SSN: [id number]  [name], MSW, [place] Transitions of Care  Clinical Social Worker I

## 2022-05-16 NOTE — Plan of Care (Signed)
Problem: Education: Goal: Knowledge of risk factors and measures for prevention of condition will improve Outcome: Progressing   Problem: Coping: Goal: Psychosocial and spiritual needs will be supported Outcome: Progressing   Problem: Respiratory: Goal: Will maintain a patent airway Outcome: Progressing Goal: Complications related to the disease process, condition or treatment will be avoided or minimized Outcome: Progressing   Problem: Education: Goal: Ability to describe self-care measures that may prevent or decrease complications (Diabetes Survival Skills Education) will improve Outcome: Progressing Goal: Individualized Educational Video(s) Outcome: Progressing   Problem: Coping: Goal: Ability to adjust to condition or change in health will improve Outcome: Progressing   Problem: Fluid Volume: Goal: Ability to maintain a balanced intake and output will improve Outcome: Progressing   Problem: Health Behavior/Discharge Planning: Goal: Ability to identify and utilize available resources and services will improve Outcome: Progressing Goal: Ability to manage health-related needs will improve Outcome: Progressing   Problem: Metabolic: Goal: Ability to maintain appropriate glucose levels will improve Outcome: Progressing   Problem: Nutritional: Goal: Maintenance of adequate nutrition will improve Outcome: Progressing Goal: Progress toward achieving an optimal weight will improve Outcome: Progressing   Problem: Skin Integrity: Goal: Risk for impaired skin integrity will decrease Outcome: Progressing   Problem: Tissue Perfusion: Goal: Adequacy of tissue perfusion will improve Outcome: Progressing   Problem: Education: Goal: Knowledge of General Education information will improve Description: Including pain rating scale, medication(s)/side effects and non-pharmacologic comfort measures Outcome: Progressing   Problem: Health Behavior/Discharge Planning: Goal:  Ability to manage health-related needs will improve Outcome: Progressing   Problem: Clinical Measurements: Goal: Ability to maintain clinical measurements within normal limits will improve Outcome: Progressing Goal: Will remain free from infection Outcome: Progressing Goal: Diagnostic test results will improve Outcome: Progressing Goal: Respiratory complications will improve Outcome: Progressing Goal: Cardiovascular complication will be avoided Outcome: Progressing   Problem: Activity: Goal: Risk for activity intolerance will decrease Outcome: Progressing   Problem: Nutrition: Goal: Adequate nutrition will be maintained Outcome: Progressing   Problem: Coping: Goal: Level of anxiety will decrease Outcome: Progressing   Problem: Elimination: Goal: Will not experience complications related to bowel motility Outcome: Progressing Goal: Will not experience complications related to urinary retention Outcome: Progressing   Problem: Pain Managment: Goal: General experience of comfort will improve Outcome: Progressing   Problem: Safety: Goal: Ability to remain free from injury will improve Outcome: Progressing   Problem: Skin Integrity: Goal: Risk for impaired skin integrity will decrease Outcome: Progressing   Problem: Education: Goal: Knowledge of General Education information will improve Description: Including pain rating scale, medication(s)/side effects and non-pharmacologic comfort measures Outcome: Progressing   Problem: Health Behavior/Discharge Planning: Goal: Ability to manage health-related needs will improve Outcome: Progressing   Problem: Clinical Measurements: Goal: Ability to maintain clinical measurements within normal limits will improve Outcome: Progressing Goal: Will remain free from infection Outcome: Progressing Goal: Diagnostic test results will improve Outcome: Progressing Goal: Respiratory complications will improve Outcome:  Progressing Goal: Cardiovascular complication will be avoided Outcome: Progressing   Problem: Activity: Goal: Risk for activity intolerance will decrease Outcome: Progressing   Problem: Nutrition: Goal: Adequate nutrition will be maintained Outcome: Progressing   Problem: Coping: Goal: Level of anxiety will decrease Outcome: Progressing   Problem: Elimination: Goal: Will not experience complications related to bowel motility Outcome: Progressing Goal: Will not experience complications related to urinary retention Outcome: Progressing   Problem: Pain Managment: Goal: General experience of comfort will improve Outcome: Progressing   Problem: Safety: Goal: Ability to remain free from  injury will improve Outcome: Progressing   Problem: Skin Integrity: Goal: Risk for impaired skin integrity will decrease Outcome: Progressing

## 2022-05-16 NOTE — TOC Benefit Eligibility Note (Signed)
Patient Teacher, English as a foreign language completed.    The patient is currently admitted and upon discharge could be taking Farixga 10 mg.  The current 30 day co-pay is $47.00.   The patient is currently admitted and upon discharge could be taking Jardiance 10 mg.  The current 30 day co-pay is $47.00.   The patient is insured through [place], [place] Patient Advocate Specialist [place] Patient Advocate Team Direct Number: [phone]  Fax: [phone]

## 2022-05-16 NOTE — Assessment & Plan Note (Signed)
Class 3 obesity with calculated BMI at 42.2

## 2022-05-16 NOTE — Progress Notes (Addendum)
Progress Note   Patient: Jessica [name] [id number] DOB: [birth year] DOA: 05/14/2022     2 DOS: the patient was seen and examined on 05/16/2022   Brief hospital course: Jessica [name] was admitted to the hospital with the working diagnosis of heart failure decompensation.   89 yo female with the past medical history of T2DM, dyslipidemia, hypertension, atrial fibrillation, AV block sp pacemaker, aortic stenosis sp TAVR complicated with aortic valve endocarditis, and heart failure who presented with weakness. Recent hospitalization 01/16 to 04/24/22 for hypotension and AKI in the setting of COVID 19 infection. At home patient had progressive weakness and difficulty ambulating. Positive dyspnea at rest and on exertion. On her initial physical examination her blood pressure was 148/113, RR 22 and HE 65, luns with rales bilaterally with decrease breath sounds at the left base, no wheezing, heart with S1 and S2 present and rhythmic, abdomen with no distention and positive lower extremity edema.   Na 139, K 3,1 CL 97 bicarbonate 29, glucose 139 bun 9 cr 0,76  Mg 1,4  BNP 907  Wbc 5,9 hgb 10,9 plt 220   Urine analysis SG 1,013, 30 protein, >50 wbc,   EKG 68 bpm, left axis deviation, left bundle brunch, atrial sensing and ventricular pacing, positive PVC, with no significant ST segment or T wave changes.   Chest radiograph with cardiomegaly with bilateral hilar vascular congestion, cephalization of the vasculature and bilateral pleural effusions. Pacemaker in place with one atrial and one ventricular lead.   02/13 patient has been placed on IV furosemide with improvement in her symptoms.   Assessment and Plan: * Acute on chronic diastolic CHF (congestive heart failure) (HCC) Echocardiogram with preserved LV systolic function with EF 55 to 60%, moderate asymmetric left ventricular hypertrophy of the basal septal segment. Preserved RV systolic function. RVSP 35.2. SP TAVR  Urine output is  documented [id number] ml Systolic blood pressure 0000000 to 99 mmHg.   Continue metoprolol, empagliflozin and entresto. Will transition to oral furosemide, if blood pressure stable will plan to add spironolactone.   History of endocarditis Recent hospitalization for endocarditits - 3 cm vegetation on TVR prosthesis by TEE1/3/24. She has completed course of IV ancef and is on oral abx indefinitely. CBCD nl, no fever  Continue with oral cefadroxil.  Her urine analysis has pyuria but no frank urinary symptoms. No change in antibiotic therapy, low pre test probability for urine infection.   Essential hypertension Continue blood pressure control with entreto and metoprolol.   Hypokalemia Hypomagnesemia.   Renal function with serum cr at 0,97 with K at 3,4 and serum bicarbonate at 30.  Mg 1,9 and Na 136  Plan to add Kcl and Mg for correction, keep K at 4 and Mg at 2. Continue close follow up renal function and electrolytes in am.   DM type 2, controlled, with complication ([place]) Continue glucose cover and monitoring with insulin sliding scale. Patient is tolerating po well.   Dementia with behavioral disturbance (HCC) Continue neuro checks per unit protocol Pt and Ot   Atrial fibrillation and flutter (HCC) Chronic atrial fibrillation  Patient with ventricular paced rhythm,  Continue with metoprolol for rate control and anticoagulation with apixaban.   GERD (gastroesophageal reflux disease) Denies discomfort or GI symptoms  Plan PPI daily  Hypothyroidism Last TSH 03/28/22 1.46  Plan Continue present dose of synthroid.   DNR (do not resuscitate) Per old record.  Will continue.   OBESITY, MORBID Class 3 obesity with calculated BMI at 42.2  Subjective: Patient is feeling better, dyspnea and edema have improved, no chest pain.   Physical Exam: Vitals:   05/15/22 1929 05/16/22 0403 05/16/22 0727 05/16/22 1107  BP: (!) 114/47 (!) 128/47 (!) 125/58 (!) 99/42  Pulse: 75  71 66 60  Resp: 19 19 16 19  $ Temp: 98.2 F (36.8 C) 98.4 F (36.9 C) 97.9 F (36.6 C)   TempSrc: Oral Oral Oral   SpO2: 96% 96% 97% 96%  Weight:  104.8 kg    Height:       Neurology awake and alert ENT with mild pallor Cardiovascular with S1 and S2 present regular with no gallops, rubs or murmurs No JVD  Respiratory with no rales or wheezing  Abdomen with no distention  Trace lower extremity edema  Data Reviewed:    Family Communication: no family at the bedside   Disposition: Status is: Inpatient Remains inpatient appropriate because: diuresis  Planned Discharge Destination: Skilled nursing facility      Author: [name], MD 05/16/2022 3:36 PM  For on call review [email or web address].

## 2022-05-16 NOTE — Progress Notes (Addendum)
   Heart Failure Stewardship Pharmacist Progress Note   PCP: Plotnikov, Evie Lacks, MD PCP-Cardiologist: Skeet Latch, MD   Patient assistance application for Jardiance initiated. Patient portion completed and signed.   Patient already receives assistance from [employer] for [employer]. She states she was just approved for another year earlier this month.  Will send Jardiance assistance application to [facility] for Dr. Oval Linsey to complete and submit.  Kerby Nora, PharmD, BCPS Heart Failure Stewardship Pharmacist Phone [phone]

## 2022-05-16 NOTE — TOC Initial Note (Signed)
Transition of Care [facility]) - Initial/Assessment Note    Patient Details  Name: Jessica [name] MRN: [id number] Date of Birth: 1934-02-11  Transition of [facility]) CM/SW Contact:    Bjorn Pippin, LCSW Phone Number: 05/16/2022, 1:27 PM  Clinical Narrative:                 CSW spoke with [facility] regarding pt potentially back there. [place] informed CSW that pt has reached her 21 days and can return to SNF with insurance auth, but will be in co-pay days. Co-pay will be $204/day. CSW communicated this information with pt and pt niece who was at bedside.   Family stated they did not want to go [facility] to [facility], due to the negative experience pt had there. The niece expressed that she would like for pt to transition to LTC at some point. Pt expressed that she wants to be home not at a LTC facility. Pt and family did agree that pt should go to STR when dc from the hospital. There is current PT/OT notes that recommend SNF.   CSW completed fl2 and faxed out. TOC will continue to follow this admission.   Expected Discharge Plan: Skilled Nursing Facility Barriers to Discharge: Continued Medical Work up   Patient Goals and CMS Choice            Expected Discharge Plan and Services                                              Prior Living Arrangements/Services   Lives with:: Self Patient language and need for interpreter reviewed:: Yes Do you feel safe going back to the place where you live?: Yes      Need for Family Participation in Patient Care: Yes (Comment) Care giver support system in place?: Yes (comment) Current home services: DME Criminal Activity/Legal Involvement Pertinent to Current Situation/Hospitalization: No - Comment as needed  Activities of Daily Living      Permission Sought/Granted Permission sought to share information with : Family Supports, Customer service manager    Share Information with NAME: [name]     Permission  granted to share info w Relationship: Niece  Permission granted to share info w Contact Information: [phone]  Emotional Assessment Appearance:: Appears stated age, Developmentally appropriate Attitude/Demeanor/Rapport: Engaged Affect (typically observed): Accepting Orientation: : Oriented to Self, Oriented to Place, Oriented to  Time Alcohol / Substance Use: Not Applicable Psych Involvement: No (comment)  Admission diagnosis:  Weakness [R53.1] Acute on chronic diastolic CHF (congestive heart failure), NYHA class 2 (HCC) [I50.33] Patient Active Problem List   Diagnosis Date Noted   Hypokalemia 05/15/2022   Acute on chronic diastolic CHF (congestive heart failure) ([place]) 05/14/2022   ABLA (acute blood loss anemia) 04/20/2022   Elevated INR 04/20/2022   Prolonged QT interval 04/20/2022   Delirium 04/19/2022   History of bacteremia 04/18/2022   COVID-19 virus infection 04/18/2022   COPD with acute exacerbation ([place]) 04/18/2022   Dementia with behavioral disturbance ([place]) 04/18/2022   MSSA bacteremia 04/04/2022   Prosthetic valve endocarditis ([place]) 04/04/2022   Pacemaker infection ([place]) 04/04/2022   Sepsis ([place]) 03/28/2022   Altered mental status 03/28/2022   Hypovolemia 03/28/2022   AKI (acute kidney injury) ([place]) 03/28/2022   Severe sepsis with acute organ dysfunction ([place]) 03/28/2022   DNR (do not resuscitate) 03/28/2022  Hypotension 03/28/2022   Rectal bleeding    Pure hypercholesterolemia 01/28/2021   Fall 12/22/2020   Knee contusion 12/22/2020   Eczema 09/09/2019   Vertigo 03/05/2019   S/P TAVR (transcatheter aortic valve replacement) 01/14/2019   Presence of permanent cardiac pacemaker    Pulmonary nodule    Severe aortic stenosis    Atrial fibrillation and flutter ([place]) 07/05/2017   DOE (dyspnea on exertion) 04/18/2017   Chronic venous insufficiency 12/08/2016   Erythema nodosum 11/10/2016   Paronychia of finger, left 04/13/2016   Left carotid artery stenosis  09/24/2014   Diarrhea 07/12/2013   Foot pain, left 03/18/2013   Gastrocnemius strain 03/18/2013   GERD (gastroesophageal reflux disease) 08/18/2011   History of pulmonary embolism 10/2008   DUODENITIS, WITHOUT HEMORRHAGE 06/29/2008   Diverticulosis of colon without hemorrhage 06/29/2008   DM type 2, controlled, with complication ([place]) [id number]   Low back pain 10/15/2007   OBESITY, MORBID 07/16/2007   Grieving 05/15/2007   HLD (hyperlipidemia) 03/03/2007   Hypothyroidism 11/06/2006   Anxiety disorder 11/06/2006   CARPAL TUNNEL SYNDROME 11/06/2006   Essential hypertension 11/06/2006   HIATAL HERNIA 11/06/2006   Osteoarthritis 11/06/2006   History of endocarditis 11/06/2006   PCP:  [name], MD Pharmacy:   [facility] [place], [address] [place] [address] Phone: [phone] Fax: [phone]  [place], [address] [place] [address] Phone: [phone] Fax: [place], [place] - [address] [place] [address] Phone: [phone] Fax: [phone]     Social Determinants of Health (SDOH) Social History: [place]: No Food Insecurity (03/30/2022)  Housing: Low Risk  (03/30/2022)  Transportation Needs: No Transportation Needs (03/30/2022)  Utilities: Not At Risk (03/30/2022)  Alcohol Screen: Low Risk  (07/01/2021)  Depression (PHQ2-9): Low Risk  (07/01/2021)  Financial Resource Strain: Low Risk  (07/01/2021)  Physical Activity: Inactive (07/01/2021)  Social Connections: Socially Isolated (07/01/2021)  Stress: No Stress Concern Present (07/01/2021)  Tobacco Use: Low Risk  (05/14/2022)   SDOH Interventions:     Readmission Risk Interventions    04/21/2022    9:30 AM  Readmission Risk  Prevention Plan  Transportation Screening Complete  Medication Review ([place]) Complete  PCP or Specialist appointment within 3-5 days of discharge Complete  HRI or [place] Complete  SW Recovery Care/Counseling Consult Complete  Palliative Care Screening Not Applicable  Skilled Nursing Facility Complete   [name], MSW, [place], LCASA Transitions of Care  Clinical Social Worker I

## 2022-05-16 NOTE — Telephone Encounter (Signed)
Heart Failure Patient Advocate Encounter  Medication assistance forms for Jardiance [facility]) have been started. Patient has signed forms.  Provider information will need to be completed before submitting.  Forms have been attached to patient chart under 'Media' tab. Routing to MD  Clista Bernhardt, CPhT Rx Patient Advocate Phone: [phone]

## 2022-05-17 ENCOUNTER — Ambulatory Visit: Payer: Medicare Other | Admitting: Internal Medicine

## 2022-05-17 DIAGNOSIS — I5033 Acute on chronic diastolic (congestive) heart failure: Secondary | ICD-10-CM | POA: Diagnosis not present

## 2022-05-17 LAB — GLUCOSE, CAPILLARY
Glucose-Capillary: 122 mg/dL — ABNORMAL HIGH (ref 70–99)
Glucose-Capillary: 174 mg/dL — ABNORMAL HIGH (ref 70–99)
Glucose-Capillary: 110 mg/dL — ABNORMAL HIGH (ref 70–99)
Glucose-Capillary: 119 mg/dL — ABNORMAL HIGH (ref 70–99)

## 2022-05-17 LAB — BASIC METABOLIC PANEL
Anion gap: 9 (ref 5–15)
BUN: 13 mg/dL (ref 8–23)
CO2: 29 mmol/L (ref 22–32)
Calcium: 8.9 mg/dL (ref 8.9–10.3)
Chloride: 96 mmol/L — ABNORMAL LOW (ref 98–111)
Creatinine, Ser: 0.92 mg/dL (ref 0.44–1.00)
GFR, Estimated: >60 mL/min (ref >60)
Glucose, Bld: 116 mg/dL — ABNORMAL HIGH (ref 70–99)
Potassium: 4.7 mmol/L (ref 3.5–5.1)
Sodium: 134 mmol/L — ABNORMAL LOW (ref 135–145)

## 2022-05-17 MED ORDER — FUROSEMIDE 10 MG/ML IJ SOLN
40.0000 mg | Freq: Two times a day (BID) | INTRAMUSCULAR | Status: AC
  Administered 2022-05-17 (×2): 40 mg via INTRAVENOUS
  Filled 2022-05-17 (×2): qty 4

## 2022-05-17 NOTE — Progress Notes (Signed)
Chaplain paged to follow up on Advance Directive. Jessica [name] would like to make her niece her health care power of attorney. Chaplain looked over her documents to ensure they were ready and placed consult for unit chaplain to return this afternoon when the notary is available.    Chaplain also provided spiritual support as Jessica [name] processed her long hospitalization and what life has looked like as she ages.  Please page as further needs arise.  Donald Prose. Elyn Peers, M.Div. [facility] Chaplain Pager [phone] Office [phone]      05/17/22 1100  Spiritual Encounters  Type of Visit Follow up  Care provided to: Patient  Referral source Patient request  Advance Directives (For Healthcare)  Does Patient Have a Medical Advance Directive? No  Type of Paramedic of [place]

## 2022-05-17 NOTE — Progress Notes (Signed)
PROGRESS NOTE    Jessica [name]  [id number] DOB: [birth year] DOA: 05/14/2022 PCP: [name], MD  [name] with history of diastolic CHF, type 2 diabetes mellitus, paroxysmal A-fib, PPM, aortic stenosis, TAVR, history of aortic valve endocarditis, recent hospitalization 1/816-22 for hypotension and AKI in the setting of COVID, was then discharged to rehab, currently thereafter went back home few days prior and back to the ED with progressive weakness and difficulty ambulating. -In the emergency room she was noted to have considerable lower extremity edema, chest x-ray noted cardiomegaly pulmonary vascular congestion and bilateral pleural effusions, creatinine was 0.7, BNP 907, abnormal UA with greater than 50 WBCs   Subjective: -Feels tired overall improving, now agreeable to rehab  Assessment and Plan:  Acute on chronic diastolic CHF  -2D echo noted EF of 55-60%, moderate LVH, preserved RV  -Diuresed with IV Lasix, 2.7 L negative, still with abdominal wall edema and lower extremity edema continue IV Lasix 1 more day  -Continue Jardiance, Entresto, metoprolol, Aldactone -Increase activity, BMP in a.m.  History of endocarditis Recent hospitalization for endocarditits - 3 cm vegetation on TVR prosthesis by TEE1/3/24. She has completed course of IV ancef and is on oral abx indefinitely.  -Continue with oral cefadroxil.   Abnormal urinalysis -Asymptomatic bacteriuria, monitor without antibiotics for this  Essential hypertension -Stable, meds as above  Hypokalemia Hypomagnesemia.  -Replaced  DM type 2, controlled, with complication (HCC) -CBGs stable, continue sliding scale insulin  Dementia with behavioral disturbance (HCC) Continue neuro checks per unit protocol Pt and Ot   Atrial fibrillation and flutter (HCC) Chronic atrial fibrillation  Patient with ventricular paced rhythm,  -Continue metoprolol, apixaban  GERD (gastroesophageal reflux disease) -Continue  PPI  Hypothyroidism -Continue Synthroid  DNR (do not resuscitate) Per old record.  Will continue.   OBESITY, MORBID Class 3 obesity with calculated BMI at 42.2   DVT prophylaxis: Apixaban Code Status: DNR Family Communication: None present Disposition Plan: SNF tomorrow if stable  Consultants:    Procedures:   Antimicrobials:    Objective: Vitals:   05/16/22 1949 05/17/22 0056 05/17/22 0415 05/17/22 0728  BP: (!) 105/57  120/68 (!) 137/56  Pulse: 65  65 68  Resp: 18  20 18  $ Temp: 97.8 F (36.6 C)  97.9 F (36.6 C) 98 F (36.7 C)  TempSrc: Oral  Oral Oral  SpO2: 96%  94% 96%  Weight:  105.1 kg    Height:        Intake/Output Summary (Last 24 hours) at 05/17/2022 1439 Last data filed at 05/17/2022 1112 Gross per 24 hour  Intake 410 ml  Output 1850 ml  Net -1440 ml   Filed Weights   05/15/22 0604 05/16/22 0403 05/17/22 0056  Weight: 106.5 kg 104.8 kg 105.1 kg    Examination:  General exam: AAOx3, cognitive and memory deficits noted HEENT: Positive JVD CVS: S1-S2, regular rhythm Lungs: Decreased breath sounds to bases Abdomen: Soft, nontender, bowel sounds present Extremities: Trace edema Skin: No rashes Psychiatry:  Mood & affect appropriate.     Data Reviewed:   CBC: Recent Labs  Lab 05/14/22 0740  WBC 5.9  NEUTROABS 3.3  HGB 10.9*  HCT 34.1*  MCV 102.1*  PLT [id number]   Basic Metabolic Panel: Recent Labs  Lab 05/14/22 0740 05/15/22 0041 05/16/22 0924 05/17/22 0058  NA 139 138 136 134*  K 3.1* 2.8* 3.4* 4.7  CL 97* 94* 94* 96*  CO2 29 32 30 29  GLUCOSE 139* 107* 174* 116*  BUN 9 10 11 13  $ CREATININE 0.76 0.92 0.97 0.92  CALCIUM 8.7* 8.4* 8.9 8.9  MG 1.4* 1.7 1.9  --    GFR: Estimated Creatinine Clearance: 50 mL/min (by C-G formula based on SCr of 0.92 mg/dL). Liver Function Tests: Recent Labs  Lab 05/14/22 0740  AST 28  ALT <5  ALKPHOS 69  BILITOT 0.6  PROT 7.7  ALBUMIN 2.6*   No results for input(s): "LIPASE",  "AMYLASE" in the last 168 hours. No results for input(s): "AMMONIA" in the last 168 hours. Coagulation Profile: No results for input(s): "INR", "PROTIME" in the last 168 hours. Cardiac Enzymes: No results for input(s): "CKTOTAL", "CKMB", "CKMBINDEX", "TROPONINI" in the last 168 hours. BNP (last 3 results) No results for input(s): "PROBNP" in the last 8760 hours. HbA1C: No results for input(s): "HGBA1C" in the last 72 hours. CBG: Recent Labs  Lab 05/16/22 1106 05/16/22 1543 05/16/22 2125 05/17/22 0604 05/17/22 1101  GLUCAP 129* 108* 133* 122* 174*   Lipid Profile: No results for input(s): "CHOL", "HDL", "LDLCALC", "TRIG", "CHOLHDL", "LDLDIRECT" in the last 72 hours. Thyroid Function Tests: No results for input(s): "TSH", "T4TOTAL", "FREET4", "T3FREE", "THYROIDAB" in the last 72 hours. Anemia Panel: No results for input(s): "VITAMINB12", "FOLATE", "FERRITIN", "TIBC", "IRON", "RETICCTPCT" in the last 72 hours. Urine analysis:    Component Value Date/Time   COLORURINE AMBER (A) 05/14/2022 0725   APPEARANCEUR CLOUDY (A) 05/14/2022 0725   LABSPEC 1.013 05/14/2022 0725   PHURINE 7.0 05/14/2022 0725   GLUCOSEU NEGATIVE 05/14/2022 0725   GLUCOSEU NEGATIVE 09/07/2020 1244   HGBUR LARGE (A) 05/14/2022 0725   BILIRUBINUR NEGATIVE 05/14/2022 0725   KETONESUR 5 (A) 05/14/2022 0725   PROTEINUR 30 (A) 05/14/2022 0725   UROBILINOGEN 0.2 09/07/2020 1244   NITRITE POSITIVE (A) 05/14/2022 0725   LEUKOCYTESUR MODERATE (A) 05/14/2022 0725   Sepsis Labs: @LABRCNTIP$ (procalcitonin:4,lacticidven:4)  )No results found for this or any previous visit (from the past 240 hour(s)).   Radiology Studies: No results found.   Scheduled Meds:  allopurinol  100 mg Oral Daily   apixaban  5 mg Oral BID   B-complex with vitamin C  1 tablet Oral Daily   cefadroxil  1,000 mg Oral BID   empagliflozin  10 mg Oral Daily   ezetimibe  10 mg Oral Daily   furosemide  40 mg Intravenous BID   insulin aspart   0-20 Units Subcutaneous TID WC   levothyroxine  75 mcg Oral Q0600   metFORMIN  500 mg Oral BID WC   metoprolol tartrate  25 mg Oral BID   rosuvastatin  10 mg Oral Q M,W,F   sacubitril-valsartan  1 tablet Oral BID   sodium chloride flush  3 mL Intravenous Q12H   spironolactone  12.5 mg Oral Daily   venlafaxine XR  75 mg Oral Q breakfast   Continuous Infusions:  sodium chloride       LOS: 3 days    Time spent:  25mn    PDomenic Polite MD [facility]   05/17/2022, 2:39 PM

## 2022-05-17 NOTE — Progress Notes (Signed)
Physical Therapy Treatment Patient Details Name: Jessica [name] MRN: [id number] DOB: Aug 15, 1933 Today's Date: 05/17/2022   History of Present Illness 89 y.o. female adm 2/11 presents to the emergency department with weakness. PMH: type 2 diabetes, HLD, HTN, atrial fibrillation anticoagulated on Eliquis, AV block with pacemaker, aortic stenosis s/p TAVR, CHF, recent admissions for MSSA bacteremia and TAVR vegetation    PT Comments    Pt admitted with above diagnosis. Pt was able to ambulate with +2 assist with chair follow. Pt with overall poor safety awareness but was able to progress ambulation with RW and walk incr distance from last visit. Probably going to SNF today.  Pt currently with functional limitations due to balance and endurance deficits. Pt will benefit from skilled PT to increase their independence and safety with mobility to allow discharge to the venue listed below.      Recommendations for follow up therapy are one component of a multi-disciplinary discharge planning process, led by the attending physician.  Recommendations may be updated based on patient status, additional functional criteria and insurance authorization.  Follow Up Recommendations  Skilled nursing-short term rehab (<3 hours/day) Can patient physically be transported by private vehicle: No   Assistance Recommended at Discharge Frequent or constant Supervision/Assistance  Patient can return home with the following A lot of help with walking and/or transfers;Assistance with cooking/housework;Direct supervision/assist for medications management;Direct supervision/assist for financial management;Assist for transportation;Help with stairs or ramp for entrance   Equipment Recommendations  Other (comment) (defer to next level of care)    Recommendations for Other Services       Precautions / Restrictions Precautions Precautions: Fall Precaution Comments: COVID Restrictions Weight Bearing Restrictions: No      Mobility  Bed Mobility Overal bed mobility: Needs Assistance Bed Mobility: Supine to Sit     Supine to sit: HOB elevated, Min guard, Supervision     General bed mobility comments: Pt was able to come to eOB without assist with incr time and incr verbal cues.    Transfers Overall transfer level: Needs assistance Equipment used: Rolling walker (2 wheels) Transfers: Sit to/from Stand, Bed to chair/wheelchair/BSC Sit to Stand: Mod assist, Min assist, +2 safety/equipment           General transfer comment: Pt needed cues for hand and foot placement as she has poor motor planning for tasks and needs constant cues for tasks.  Once up needed steadying assist as pt quickly fatigues and flexes at trunk and knees.  Pt did better maintaining standing today with verbal cues and less tactile cues although as she fatigued needing heavier tactile cues as well.    Ambulation/Gait Ambulation/Gait assistance: Mod assist, +2 safety/equipment Gait Distance (Feet): 21 Feet (7 feet x 3) Assistive device: Rolling walker (2 wheels) Gait Pattern/deviations: Decreased step length - right, Decreased step length - left, Shuffle, Knee flexed in stance - right, Knee flexed in stance - left, Trunk flexed, Leaning posteriorly, Narrow base of support   Gait velocity interpretation: <1.31 ft/sec, indicative of household ambulator   General Gait Details: Pt was able to progress ambulation today with pt taking 3 walks with chair follow, fatiguing and needing rest breaks.  Pt did give warning before needing to sit today but still not reaching back and sitting somewhat quickly.  Pt needs constant cues to stand tall and cues for sequcning steps and RW.   Pt fatigues quickly and also appears to have decr motor processing as well.  Pt did show some  safety  awareness today at times remembering to push up from the chair however pt with decr safety as she fatigues.   Stairs             Wheelchair Mobility     Modified Rankin (Stroke Patients Only)       Balance Overall balance assessment: Needs assistance Sitting-balance support: Feet supported, No upper extremity supported Sitting balance-Leahy Scale: Good Sitting balance - Comments: min guard to close supervision   Standing balance support: Bilateral upper extremity supported, During functional activity Standing balance-Leahy Scale: Poor Standing balance comment: assist provided for safety and balance, pt with limited standing tolerance                            Cognition Arousal/Alertness: Awake/alert Behavior During Therapy: WFL for tasks assessed/performed Overall Cognitive Status: History of cognitive impairments - at baseline Area of Impairment: Attention, Memory, Following commands, Safety/judgement, Problem solving                 Orientation Level: Disoriented to, Situation Current Attention Level: Selective Memory: Decreased short-term memory Following Commands: Follows one step commands inconsistently, Follows one step commands with increased time Safety/Judgement: Decreased awareness of safety, Decreased awareness of deficits Awareness: Emergent Problem Solving: Requires verbal cues General Comments: Pt oriented to self, time, and place,        Exercises General Exercises - Lower Extremity Ankle Circles/Pumps: AROM, Both, 10 reps, Supine Long Arc Quad: AROM, Both, 10 reps, Seated    General Comments        Pertinent Vitals/Pain Pain Assessment Pain Assessment: No/denies pain Facial Expression: smiling or inexpressive Body Language: relaxed    Home Living                          Prior Function            PT Goals (current goals can now be found in the care plan section) Acute Rehab PT Goals Patient Stated Goal: get better Progress towards PT goals: Progressing toward goals    Frequency    Min 2X/week      PT Plan Current plan remains appropriate     Co-evaluation              AM-PAC PT "6 Clicks" Mobility   Outcome Measure  Help needed turning from your back to your side while in a flat bed without using bedrails?: A Lot Help needed moving from lying on your back to sitting on the side of a flat bed without using bedrails?: A Lot Help needed moving to and from a bed to a chair (including a wheelchair)?: Total Help needed standing up from a chair using your arms (e.g., wheelchair or bedside chair)?: A Lot Help needed to walk in hospital room?: Total Help needed climbing 3-5 steps with a railing? : Total 6 Click Score: 9    End of Session Equipment Utilized During Treatment: Gait belt Activity Tolerance: Patient limited by fatigue Patient left: in chair;with chair alarm set;with call bell/phone within reach;with family/visitor present Nurse Communication: Mobility status;Need for lift equipment Charlaine Dalton) PT Visit Diagnosis: Unsteadiness on feet (R26.81);Muscle weakness (generalized) (M62.81)     Time: [id number] PT Time Calculation (min) (ACUTE ONLY): 22 min  Charges:  $Gait Training: 8-22 mins                     Franky Reier M,PT [place] [phone]  Alvira Philips 05/17/2022, 2:27 PM

## 2022-05-17 NOTE — Progress Notes (Signed)
Advance Directive completed, Witnessed, Notarized, and given to nursing station for scanning

## 2022-05-18 DIAGNOSIS — I5033 Acute on chronic diastolic (congestive) heart failure: Secondary | ICD-10-CM | POA: Diagnosis not present

## 2022-05-18 LAB — BASIC METABOLIC PANEL WITH GFR
Anion gap: 12 (ref 5–15)
BUN: 17 mg/dL (ref 8–23)
CO2: 28 mmol/L (ref 22–32)
Calcium: 9.5 mg/dL (ref 8.9–10.3)
Chloride: 95 mmol/L — ABNORMAL LOW (ref 98–111)
Creatinine, Ser: 1.12 mg/dL — ABNORMAL HIGH (ref 0.44–1.00)
GFR, Estimated: 48 mL/min — ABNORMAL LOW
Glucose, Bld: 126 mg/dL — ABNORMAL HIGH (ref 70–99)
Potassium: 3.7 mmol/L (ref 3.5–5.1)
Sodium: 135 mmol/L (ref 135–145)

## 2022-05-18 LAB — GLUCOSE, CAPILLARY
Glucose-Capillary: 129 mg/dL — ABNORMAL HIGH (ref 70–99)
Glucose-Capillary: 120 mg/dL — ABNORMAL HIGH (ref 70–99)
Glucose-Capillary: 120 mg/dL — ABNORMAL HIGH (ref 70–99)
Glucose-Capillary: 119 mg/dL — ABNORMAL HIGH (ref 70–99)

## 2022-05-18 MED ORDER — FUROSEMIDE 40 MG PO TABS: 40.0000 mg | ORAL_TABLET | Freq: Every day | ORAL | 2 refills | Status: DC

## 2022-05-18 MED ORDER — SPIRONOLACTONE 25 MG PO TABS: 12.5000 mg | ORAL_TABLET | Freq: Every day | ORAL | Status: DC

## 2022-05-18 MED ORDER — FUROSEMIDE 20 MG PO TABS
20.0000 mg | ORAL_TABLET | Freq: Every day | ORAL | Status: DC
  Administered 2022-05-18 – 2022-05-19 (×2): 20 mg via ORAL
  Filled 2022-05-18 (×2): qty 1

## 2022-05-18 MED ORDER — EMPAGLIFLOZIN 10 MG PO TABS: 10.0000 mg | ORAL_TABLET | Freq: Every day | ORAL | Status: DC

## 2022-05-18 NOTE — Care Management Important Message (Signed)
Important Message  Patient Details  Name: Jessica [name] MRN: [id number] Date of Birth: 12-25-1933   Medicare Important Message Given:  Yes     Hannah Beat 05/18/2022, 2:37 PM

## 2022-05-18 NOTE — Discharge Summary (Addendum)
Physician Discharge Summary  Jessica [name] [id number] DOB: [birth year] DOA: 05/14/2022  PCP: [name], MD  Admit date: 05/14/2022 Discharge date: 05/19/2022  Time spent: 45 minutes  Recommendations for Outpatient Follow-up:  PCP in 1 to 2 weeks, please check BMP at follow-up Cardiology Dr. Oval Linsey in 3 weeks   Discharge Diagnoses:  Principal Problem:   Acute on chronic diastolic CHF (congestive heart failure) ([place]) Active Problems:   History of endocarditis   Essential hypertension   DM type 2, controlled, with complication ([place])   Hypokalemia   Dementia with behavioral disturbance (HCC)   Atrial fibrillation and flutter (HCC)   Hypothyroidism   GERD (gastroesophageal reflux disease)   DNR (do not resuscitate)   OBESITY, MORBID   Discharge Condition: Improved  Diet recommendation: Low-sodium, heart healthy  Filed Weights   05/17/22 0056 05/18/22 0500 05/19/22 0655  Weight: 105.1 kg 102.1 kg 101.5 kg    History of present illness:  [name] with history of diastolic CHF, type 2 diabetes mellitus, paroxysmal A-fib, PPM, aortic stenosis, TAVR, history of aortic valve endocarditis, recent hospitalization 1/816-22 for hypotension and AKI in the setting of COVID, was then discharged to rehab, currently thereafter went back home few days prior and back to the ED with progressive weakness and difficulty ambulating. -In the emergency room she was noted to have considerable lower extremity edema, chest x-ray noted cardiomegaly pulmonary vascular congestion and bilateral pleural effusions, creatinine was 0.7, BNP 907, abnormal UA with greater than 50 WBCs  Hospital Course:   Acute on chronic diastolic CHF  -2D echo noted EF of 55-60%, moderate LVH, preserved RV  -Diuresed with IV Lasix, 5.9 L negative, -Switched to oral Lasix -Continue Jardiance, Entresto, metoprolol, Aldactone -Has significant deconditioning and mild cognitive deficits, seen by PT OT, recommended  SNF for short-term rehab -Recommend follow-up with Dr. Oval Linsey in 2 to 3 weeks   History of endocarditis Recent hospitalization for endocarditits - 3 cm vegetation on TVR prosthesis by TEE1/3/24. She has completed course of IV ancef and is on oral abx indefinitely.  -Continue with oral cefadroxil.    Abnormal urinalysis -Asymptomatic bacteriuria, monitor without antibiotics for this   Essential hypertension -Stable, meds as above   Hypokalemia Hypomagnesemia.  -Replaced   DM type 2, controlled, with complication (HCC) -CBGs stable   Dementia with behavioral disturbance (HCC) -Stable   Atrial fibrillation and flutter (HCC) Chronic atrial fibrillation  Patient with ventricular paced rhythm,  -Continue metoprolol, apixaban   GERD (gastroesophageal reflux disease) -Continue PPI   Hypothyroidism -Continue Synthroid   DNR (do not resuscitate)   OBESITY, MORBID Class 3 obesity with calculated BMI at 42.2   Discharge Exam: Vitals:   05/19/22 0914 05/19/22 1121  BP: (!) 105/59 (!) 116/58  Pulse: 73 78  Resp: 18 20  Temp: 97.8 F (36.6 C) 97.9 F (36.6 C)  SpO2: 95% 95%    General exam: AAOx3, cognitive and memory deficits noted HEENT: Positive JVD CVS: S1-S2, regular rhythm Lungs: Decreased breath sounds to bases Abdomen: Soft, nontender, bowel sounds present Extremities: Trace edema Skin: No rashes Psychiatry:  Mood & affect appropriate.  Discharge Instructions    Allergies as of 05/19/2022       Reactions   Coreg [carvedilol] Other (See Comments)   Weak legs   Relafen [nabumetone] Other (See Comments)   Upset stomach   Calcium Other (See Comments)   Unknown reaction   Codeine Other (See Comments)   Unknown reaction   Lipitor [atorvastatin] Other (  See Comments)   Myalgias   Pacerone [amiodarone] Other (See Comments)   Hand tremors   Vasotec [enalapril] Cough   Vioxx [rofecoxib] Other (See Comments)   Unknown reaction   Zocor [simvastatin] Other  (See Comments)   Myalgias        Medication List     STOP taking these medications    ceFAZolin  IVPB Commonly known as: ANCEF   OXYGEN       TAKE these medications    acetaminophen 500 MG tablet Commonly known as: TYLENOL Take 2 tablets (1,000 mg total) by mouth every 8 (eight) hours for 5 days, THEN 2 tablets (1,000 mg total) every 8 (eight) hours as needed for up to 25 days. Start taking on: April 24, 2022   albuterol 108 (90 Base) MCG/ACT inhaler Commonly known as: VENTOLIN HFA Inhale 2 puffs into the lungs every 6 (six) hours as needed for wheezing or shortness of breath.   allopurinol 100 MG tablet Commonly known as: ZYLOPRIM TAKE 1 TABLET BY MOUTH DAILY   apixaban 5 MG Tabs tablet Commonly known as: ELIQUIS Take 1 tablet (5 mg total) by mouth 2 (two) times daily.   b complex vitamins tablet Take 1 tablet by mouth daily.   cefadroxil 500 MG capsule Commonly known as: DURICEF Take 1,000 mg by mouth 2 (two) times daily.   Cholecalciferol 25 MCG (1000 UT) tablet Take 1,000 Units by mouth daily.   empagliflozin 10 MG Tabs tablet Commonly known as: JARDIANCE Take 1 tablet (10 mg total) by mouth daily.   Entresto 97-103 MG Generic drug: sacubitril-valsartan Take 1 tablet by mouth 2 (two) times daily.   ezetimibe 10 MG tablet Commonly known as: ZETIA TAKE 1 TABLET BY MOUTH DAILY   famotidine 20 MG tablet Commonly known as: PEPCID Take 1 tablet (20 mg total) by mouth daily.   furosemide 40 MG tablet Commonly known as: LASIX Take 1 tablet (40 mg total) by mouth daily. What changed:  when to take this reasons to take this   HEPARIN LOCK FLUSH IV Inject 10 mLs into the vein 3 (three) times daily.   levothyroxine 75 MCG tablet Commonly known as: SYNTHROID TAKE 1 TABLET BY MOUTH DAILY What changed:  how much to take when to take this   metFORMIN 500 MG tablet Commonly known as: GLUCOPHAGE TAKE 1 TABLET BY MOUTH TWICE  DAILY WITH MEALS    metoprolol tartrate 25 MG tablet Commonly known as: LOPRESSOR Take 1 tablet (25 mg total) by mouth 2 (two) times daily.   multivitamin tablet Take 1 tablet by mouth daily.   nystatin powder Generic drug: nystatin Apply 1 Application topically daily. Apply to under breast topically every day shift for redness/moisture.   repaglinide 2 MG tablet Commonly known as: PRANDIN TAKE 2 TABLETS BY MOUTH 3  TIMES DAILY BEFORE MEALS What changed: See the new instructions.   rosuvastatin 10 MG tablet Commonly known as: CRESTOR TAKE 1 TABLET BY MOUTH UP TO 3  TIMES WEEKLY AS TOLERATED What changed:  how much to take how to take this when to take this additional instructions   saccharomyces boulardii 250 MG capsule Commonly known as: FLORASTOR Take 250 mg by mouth 2 (two) times daily.   sodium chloride flush 0.9 % Soln Commonly known as: NS Inject 10 mLs into the vein 3 (three) times daily. Until 02.08.2024   spironolactone 25 MG tablet Commonly known as: ALDACTONE Take 0.5 tablets (12.5 mg total) by mouth daily.   temazepam  7.5 MG capsule Commonly known as: RESTORIL Take 7.5 mg by mouth at bedtime.   venlafaxine XR 75 MG 24 hr capsule Commonly known as: EFFEXOR-XR TAKE 1 CAPSULE BY MOUTH  DAILY WITH BREAKFAST What changed:  how much to take how to take this when to take this additional instructions       Allergies  Allergen Reactions   Coreg [Carvedilol] Other (See Comments)    Weak legs   Relafen [Nabumetone] Other (See Comments)    Upset stomach   Calcium Other (See Comments)    Unknown reaction   Codeine Other (See Comments)    Unknown reaction   Lipitor [Atorvastatin] Other (See Comments)    Myalgias   Pacerone [Amiodarone] Other (See Comments)    Hand tremors   Vasotec [Enalapril] Cough   Vioxx [Rofecoxib] Other (See Comments)    Unknown reaction   Zocor [Simvastatin] Other (See Comments)    Myalgias      The results of significant diagnostics from  this hospitalization (including imaging, microbiology, ancillary and laboratory) are listed below for reference.    Significant Diagnostic Studies: DG Hip Unilat With Pelvis 2-3 Views Left  Result Date: 05/14/2022 CLINICAL DATA:  Cough, weakness, hip pain EXAM: DG HIP (WITH OR WITHOUT PELVIS) 2-3V LEFT COMPARISON:  None Available. FINDINGS: No acute fracture or dislocation. No aggressive osseous lesion. Normal alignment. Mild osteoarthritis of the left hip. Generalized osteopenia. Soft tissue are unremarkable. No radiopaque foreign body or soft tissue emphysema. IMPRESSION: 1. Mild osteoarthritis of the left hip. Given the patient's age and osteopenia, if there is persistent clinical concern for an occult hip fracture, a MRI of the hip is recommended for increased sensitivity. Electronically Signed   By: Kathreen Devoid M.D.   On: 05/14/2022 09:35   DG Chest Port 1 View  Result Date: 05/14/2022 CLINICAL DATA:  Cough, pain EXAM: PORTABLE CHEST 1 VIEW COMPARISON:  04/25/2022 FINDINGS: Bilateral interstitial thickening. Small bilateral pleural effusions. Bibasilar atelectasis. No pneumothorax. Stable cardiomegaly. Prior TAVR. Dual lead cardiac pacemaker. No acute osseous abnormality. IMPRESSION: 1. Findings concerning for CHF. Electronically Signed   By: Kathreen Devoid M.D.   On: 05/14/2022 09:34   DG Chest 1 View  Result Date: 04/25/2022 CLINICAL DATA:  Chest pain. EXAM: CHEST  1 VIEW COMPARISON:  April 20, 2022. FINDINGS: Stable cardiomegaly. Left-sided pacemaker is unchanged in position. Increased bibasilar atelectasis or edema is noted with associated pleural effusions. Bony thorax is unremarkable. IMPRESSION: Increased bibasilar opacities as described above. Electronically Signed   By: Marijo Conception M.D.   On: 04/25/2022 16:15   [place] EKG SITE RITE  Result Date: 04/20/2022 If Site Rite image not attached, placement could not be confirmed due to current cardiac rhythm.  DG CHEST PORT 1 VIEW  Result  Date: 04/20/2022 CLINICAL DATA:  Shortness of breath. EXAM: PORTABLE CHEST 1 VIEW COMPARISON:  04/18/2022. FINDINGS: Heart is enlarged and the mediastinal contour stable. A TAVR stent is noted. There is atherosclerotic calcification of the aorta. The pulmonary vasculature is distended. Mild airspace disease is noted bilaterally. No effusion or pneumothorax. A dual lead pacemaker is present over the left chest. No acute osseous abnormality. IMPRESSION: 1. Cardiomegaly with pulmonary vascular congestion. 2. Mild airspace disease bilaterally, possible edema versus pneumonia. Electronically Signed   By: Brett Fairy M.D.   On: 04/20/2022 00:46    Microbiology: No results found for this or any previous visit (from the past 240 hour(s)).   Labs: Basic Metabolic Panel: Recent Labs  Lab 05/14/22 0740 05/15/22 0041 05/16/22 0924 05/17/22 0058 05/18/22 0040  NA 139 138 136 134* 135  K 3.1* 2.8* 3.4* 4.7 3.7  CL 97* 94* 94* 96* 95*  CO2 29 32 30 29 28  $ GLUCOSE 139* 107* 174* 116* 126*  BUN 9 10 11 13 17  $ CREATININE 0.76 0.92 0.97 0.92 1.12*  CALCIUM 8.7* 8.4* 8.9 8.9 9.5  MG 1.4* 1.7 1.9  --   --    Liver Function Tests: Recent Labs  Lab 05/14/22 0740  AST 28  ALT <5  ALKPHOS 69  BILITOT 0.6  PROT 7.7  ALBUMIN 2.6*   No results for input(s): "LIPASE", "AMYLASE" in the last 168 hours. No results for input(s): "AMMONIA" in the last 168 hours. CBC: Recent Labs  Lab 05/14/22 0740  WBC 5.9  NEUTROABS 3.3  HGB 10.9*  HCT 34.1*  MCV 102.1*  PLT 220   Cardiac Enzymes: No results for input(s): "CKTOTAL", "CKMB", "CKMBINDEX", "TROPONINI" in the last 168 hours. BNP: BNP (last 3 results) Recent Labs    03/30/22 0347 04/20/22 1025 05/14/22 0740  BNP 471.9* 896.3* 907.2*    ProBNP (last 3 results) No results for input(s): "PROBNP" in the last 8760 hours.  CBG: Recent Labs  Lab 05/18/22 1141 05/18/22 1559 05/18/22 2119 05/19/22 0655 05/19/22 1123  GLUCAP 120* 120* 119*  137* 136*       Signed:  Domenic Polite MD.  [facility] 05/19/2022, 11:38 AM

## 2022-05-18 NOTE — TOC Progression Note (Signed)
Transition of Care [facility]) - Progression Note    Patient Details  Name: Jessica [name] MRN: [id number] Date of Birth: 1933/06/17  Transition of Care [facility]) CM/SW [place], LCSW Phone Number: 05/18/2022, 3:58 PM  Clinical Narrative:    Pt was set to dc to [facility]. SNF notified CSW this morning that they were rescinding the bed offer for pt. SNF stated they no longer have any female beds and they believed pt is not an appropriate STR candidate and may need LTC.   CSW notified pt and family of SNF decision. [facility] in [facility] is a SNF the family then requested. CSW contacted the facility to learn of the earliest bed available and called navi to change [facility] facility. [facility] later informed CSW that they no longer have any beds available. CSW made family aware.   Pt does have a bed offer from [facility]. CSW contacted [facility] and they stated they would have a bed available for pt tomorrow. Pt would need insurance auth. CSW communicated this with pt and family.     Pt niece [name] asked for CSW to contact Tiffany from [facility] to see if pt could potentially dc there. CSW contacted Tiffany and they requested a referral be faxed over to see if pt is eligible. CSW faxed over the documentation and reached back out to [place]. A VM was left.   TOC will continue to follow this admission.      Expected Discharge Plan: [place] Barriers to Discharge: Continued Medical Work up  Expected Discharge Plan and Services                                               Social Determinants of Health (SDOH) Interventions [place]: No Food Insecurity (03/30/2022)  Housing: Low Risk  (03/30/2022)  Transportation Needs: No Transportation Needs (03/30/2022)  Utilities: Not At Risk (03/30/2022)  Alcohol Screen: Low Risk  (07/01/2021)  Depression (PHQ2-9): Low Risk  (07/01/2021)   Financial Resource Strain: Low Risk  (07/01/2021)  Physical Activity: Inactive (07/01/2021)  Social Connections: Socially Isolated (07/01/2021)  Stress: No Stress Concern Present (07/01/2021)  Tobacco Use: Low Risk  (05/14/2022)    Readmission Risk Interventions    04/21/2022    9:30 AM  Readmission Risk Prevention Plan  Transportation Screening Complete  Medication Review ([place]) Complete  PCP or Specialist appointment within 3-5 days of discharge Complete  HRI or [place] Complete  SW Recovery Care/Counseling Consult Complete  Palliative Care Screening Not [place] Complete   [name], MSW, [place], LCASA Transitions of Care  Clinical Social Worker I

## 2022-05-18 NOTE — Progress Notes (Signed)
Occupational Therapy Treatment Patient Details Name: Jessica [name] MRN: [id number] DOB: [birth year] Today's Date: 05/18/2022   History of present illness 89 y.o. female adm 2/11 presents to the emergency department with weakness. PMH: type 2 diabetes, HLD, HTN, atrial fibrillation anticoagulated on Eliquis, AV block with pacemaker, aortic stenosis s/p TAVR, CHF, recent admissions for MSSA bacteremia and TAVR vegetation   OT comments  Patient scheduled for discharge this date to SNF.  Patient continues to need Max A for basic mobility and lower body ADL.  She has progressed from bedlevel ADL to seated, but remains with deficits listed below.  OT can continue efforts to address deficits listed, if she remains in the acute setting.     Recommendations for follow up therapy are one component of a multi-disciplinary discharge planning process, led by the attending physician.  Recommendations may be updated based on patient status, additional functional criteria and insurance authorization.    Follow Up Recommendations  Skilled nursing-short term rehab (<3 hours/day)     Assistance Recommended at Discharge Frequent or constant Supervision/Assistance  Patient can return home with the following  Assistance with cooking/housework;Direct supervision/assist for medications management;Direct supervision/assist for financial management;Help with stairs or ramp for entrance;Assist for transportation;A lot of help with bathing/dressing/bathroom;A lot of help with walking and/or transfers   Equipment Recommendations  None recommended by OT    Recommendations for Other Services      Precautions / Restrictions Precautions Precautions: Fall Restrictions Weight Bearing Restrictions: No       Mobility Bed Mobility               General bed mobility comments: up in recliner    Transfers Overall transfer level: Needs assistance   Transfers: Sit to/from Stand Sit to Stand: Max assist                  Balance Overall balance assessment: Needs assistance Sitting-balance support: Feet supported, No upper extremity supported Sitting balance-Leahy Scale: Good     Standing balance support: Bilateral upper extremity supported, During functional activity Standing balance-Leahy Scale: Poor                             ADL either performed or assessed with clinical judgement   ADL Overall ADL's : Needs assistance/impaired     Grooming: Wash/dry face;Set up;Sitting;Wash/dry hands;Brushing hair           Upper Body Dressing : Minimal assistance;Sitting   Lower Body Dressing: Maximal assistance;Sit to/from stand   Toilet Transfer: Maximal assistance;Stand-pivot;BSC/3in1                  Extremity/Trunk Assessment Upper Extremity Assessment Upper Extremity Assessment: Generalized weakness   Lower Extremity Assessment Lower Extremity Assessment: Defer to PT evaluation   Cervical / Trunk Assessment Cervical / Trunk Assessment: Normal    Vision       Perception     Praxis      Cognition Arousal/Alertness: Awake/alert Behavior During Therapy: WFL for tasks assessed/performed Overall Cognitive Status: History of cognitive impairments - at baseline                       Memory: Decreased short-term memory       Problem Solving: Requires verbal cues                             Pertinent  Vitals/ Pain       Pain Assessment Pain Assessment: No/denies pain Pain Intervention(s): Monitored during session                                                          Frequency  Min 2X/week        Progress Toward Goals  OT Goals(current goals can now be found in the care plan section)  Progress towards OT goals: Progressing toward goals  Acute Rehab OT Goals OT Goal Formulation: With patient Time For Goal Achievement: 05/29/22 Potential to Achieve Goals: [place] Discharge plan  remains appropriate    Co-evaluation                 AM-PAC OT "6 Clicks" Daily Activity     Outcome Measure   Help from another person eating meals?: None Help from another person taking care of personal grooming?: None Help from another person toileting, which includes using toliet, bedpan, or urinal?: A Lot Help from another person bathing (including washing, rinsing, drying)?: A Lot Help from another person to put on and taking off regular upper body clothing?: A Lot Help from another person to put on and taking off regular lower body clothing?: A Lot 6 Click Score: 16    End of Session Equipment Utilized During Treatment: Rolling walker (2 wheels)  OT Visit Diagnosis: Other abnormalities of gait and mobility (R26.89);Muscle weakness (generalized) (M62.81);Dizziness and giddiness (R42)   Activity Tolerance Patient tolerated treatment well   Patient Left in chair;with call bell/phone within reach   Nurse Communication Mobility status        Time: 1000-1016 OT Time Calculation (min): 16 min  Charges: OT General Charges $OT Visit: 1 Visit OT Treatments $Self Care/Home Management : 8-22 mins  05/18/2022  RP, OTR/L  Acute Rehabilitation Services  Office:  [phone]   Metta Clines 05/18/2022, 10:19 AM

## 2022-05-18 NOTE — Progress Notes (Signed)
Physical Therapy Treatment Patient Details Name: Jessica [name] MRN: [id number] DOB: 1933/07/19 Today's Date: 05/18/2022   History of Present Illness 89 y.o. female adm 2/11 presents to the emergency department with weakness. PMH: type 2 diabetes, HLD, HTN, atrial fibrillation anticoagulated on Eliquis, AV block with pacemaker, aortic stenosis s/p TAVR, CHF, recent admissions for MSSA bacteremia and TAVR vegetation    PT Comments    Pt greeted up in recliner on arrival and eager for session as pt motivated to return to PLOF. Pt requiring mod a to power up to stand increasing to max A as pt fatigue increasing. Pt unable to take steps away from chair this date 2/2 fatigue with pt sitting abruptly on all trials despite cues. Pt with fair tolerance for LE therex for increased ROM and strength and ultimately able to complete x10 STS during session. Current plan remains appropriate to address deficits and maximize functional independence and decrease caregiver burden. Pt continues to benefit from skilled PT services to progress toward functional mobility goals.      Recommendations for follow up therapy are one component of a multi-disciplinary discharge planning process, led by the attending physician.  Recommendations may be updated based on patient status, additional functional criteria and insurance authorization.  Follow Up Recommendations  Skilled nursing-short term rehab (<3 hours/day) Can patient physically be transported by private vehicle: No   Assistance Recommended at Discharge Frequent or constant Supervision/Assistance  Patient can return home with the following A lot of help with walking and/or transfers;Assistance with cooking/housework;Direct supervision/assist for medications management;Direct supervision/assist for financial management;Assist for transportation;Help with stairs or ramp for entrance   Equipment Recommendations  Other (comment) (TBD)    Recommendations for  Other Services       Precautions / Restrictions Precautions Precautions: Fall Restrictions Weight Bearing Restrictions: No     Mobility  Bed Mobility               General bed mobility comments: up in recliner pre and post session    Transfers Overall transfer level: Needs assistance Equipment used: Rolling walker (2 wheels) Transfers: Sit to/from Stand Sit to Stand: Mod assist, Max assist           General transfer comment: mod increasing to max A with fatigue, repeated cues for hand placement    Ambulation/Gait               General Gait Details: pt unable to take steps this date, pt self reporting increased fatigue, pt able to march in pace but would sit abruptly   Stairs             Wheelchair Mobility    Modified Rankin (Stroke Patients Only)       Balance Overall balance assessment: Needs assistance Sitting-balance support: Feet supported, No upper extremity supported Sitting balance-Leahy Scale: Good Sitting balance - Comments: min guard to close supervision   Standing balance support: Bilateral upper extremity supported, During functional activity Standing balance-Leahy Scale: Poor Standing balance comment: assist provided for safety and balance, pt with limited standing tolerance, up to 20 seconds at a time maximum sitting quickly despite encouragement to remain standing                            Cognition Arousal/Alertness: Awake/alert Behavior During Therapy: WFL for tasks assessed/performed Overall Cognitive Status: History of cognitive impairments - at baseline  Memory: Decreased short-term memory       Problem Solving: Requires verbal cues          Exercises General Exercises - Lower Extremity Long Arc Quad: AROM, Both, 20 reps, Seated Hip Flexion/Marching: AROM, Both, 20 reps, Seated Other Exercises Other Exercises: modified sit ups without UE use x 10 to challenge  trunk engagement and stability    General Comments General comments (skin integrity, edema, etc.): VSS on RA      Pertinent Vitals/Pain Pain Assessment Pain Assessment: No/denies pain Pain Intervention(s): Monitored during session    Home Living                          Prior Function            PT Goals (current goals can now be found in the care plan section) Acute Rehab PT Goals Patient Stated Goal: get better PT Goal Formulation: With patient Time For Goal Achievement: 05/29/22 Progress towards PT goals: Progressing toward goals    Frequency    Min 2X/week      PT Plan Current plan remains appropriate    Co-evaluation              AM-PAC PT "6 Clicks" Mobility   Outcome Measure  Help needed turning from your back to your side while in a flat bed without using bedrails?: A Lot Help needed moving from lying on your back to sitting on the side of a flat bed without using bedrails?: A Lot Help needed moving to and from a bed to a chair (including a wheelchair)?: Total Help needed standing up from a chair using your arms (e.g., wheelchair or bedside chair)?: A Lot Help needed to walk in hospital room?: Total Help needed climbing 3-5 steps with a railing? : Total 6 Click Score: 9    End of Session Equipment Utilized During Treatment: Gait belt Activity Tolerance: Patient limited by fatigue Patient left: in chair;with chair alarm set;with call bell/phone within reach Nurse Communication: Mobility status PT Visit Diagnosis: Unsteadiness on feet (R26.81);Muscle weakness (generalized) (M62.81)     Time: [id number] PT Time Calculation (min) (ACUTE ONLY): 23 min  Charges:  $Gait Training: 8-22 mins $Therapeutic Activity: 8-22 mins                     Jessica [name] R. PTA Acute Rehabilitation Services Office: [place] 05/18/2022, 4:15 PM

## 2022-05-19 DIAGNOSIS — F03918 Unspecified dementia, unspecified severity, with other behavioral disturbance: Secondary | ICD-10-CM | POA: Diagnosis not present

## 2022-05-19 DIAGNOSIS — Z950 Presence of cardiac pacemaker: Secondary | ICD-10-CM | POA: Diagnosis not present

## 2022-05-19 DIAGNOSIS — I339 Acute and subacute endocarditis, unspecified: Secondary | ICD-10-CM | POA: Diagnosis not present

## 2022-05-19 DIAGNOSIS — I38 Endocarditis, valve unspecified: Secondary | ICD-10-CM | POA: Diagnosis not present

## 2022-05-19 DIAGNOSIS — Z7901 Long term (current) use of anticoagulants: Secondary | ICD-10-CM | POA: Diagnosis not present

## 2022-05-19 DIAGNOSIS — M1990 Unspecified osteoarthritis, unspecified site: Secondary | ICD-10-CM | POA: Diagnosis not present

## 2022-05-19 DIAGNOSIS — R404 Transient alteration of awareness: Secondary | ICD-10-CM | POA: Diagnosis not present

## 2022-05-19 DIAGNOSIS — Z743 Need for continuous supervision: Secondary | ICD-10-CM | POA: Diagnosis not present

## 2022-05-19 DIAGNOSIS — I5032 Chronic diastolic (congestive) heart failure: Secondary | ICD-10-CM | POA: Diagnosis not present

## 2022-05-19 DIAGNOSIS — E785 Hyperlipidemia, unspecified: Secondary | ICD-10-CM | POA: Diagnosis not present

## 2022-05-19 DIAGNOSIS — G4700 Insomnia, unspecified: Secondary | ICD-10-CM | POA: Diagnosis not present

## 2022-05-19 DIAGNOSIS — E119 Type 2 diabetes mellitus without complications: Secondary | ICD-10-CM | POA: Diagnosis not present

## 2022-05-19 DIAGNOSIS — M6281 Muscle weakness (generalized): Secondary | ICD-10-CM | POA: Diagnosis not present

## 2022-05-19 DIAGNOSIS — I350 Nonrheumatic aortic (valve) stenosis: Secondary | ICD-10-CM | POA: Diagnosis not present

## 2022-05-19 DIAGNOSIS — I5033 Acute on chronic diastolic (congestive) heart failure: Secondary | ICD-10-CM | POA: Diagnosis not present

## 2022-05-19 DIAGNOSIS — I4891 Unspecified atrial fibrillation: Secondary | ICD-10-CM | POA: Diagnosis not present

## 2022-05-19 DIAGNOSIS — I4892 Unspecified atrial flutter: Secondary | ICD-10-CM | POA: Diagnosis not present

## 2022-05-19 DIAGNOSIS — I10 Essential (primary) hypertension: Secondary | ICD-10-CM | POA: Diagnosis not present

## 2022-05-19 DIAGNOSIS — F32A Depression, unspecified: Secondary | ICD-10-CM | POA: Diagnosis not present

## 2022-05-19 DIAGNOSIS — Z7401 Bed confinement status: Secondary | ICD-10-CM | POA: Diagnosis not present

## 2022-05-19 DIAGNOSIS — J449 Chronic obstructive pulmonary disease, unspecified: Secondary | ICD-10-CM | POA: Diagnosis not present

## 2022-05-19 DIAGNOSIS — I442 Atrioventricular block, complete: Secondary | ICD-10-CM | POA: Diagnosis not present

## 2022-05-19 DIAGNOSIS — R5381 Other malaise: Secondary | ICD-10-CM | POA: Diagnosis not present

## 2022-05-19 DIAGNOSIS — I5030 Unspecified diastolic (congestive) heart failure: Secondary | ICD-10-CM | POA: Diagnosis not present

## 2022-05-19 DIAGNOSIS — R296 Repeated falls: Secondary | ICD-10-CM | POA: Diagnosis not present

## 2022-05-19 DIAGNOSIS — E118 Type 2 diabetes mellitus with unspecified complications: Secondary | ICD-10-CM | POA: Diagnosis not present

## 2022-05-19 DIAGNOSIS — K219 Gastro-esophageal reflux disease without esophagitis: Secondary | ICD-10-CM | POA: Diagnosis not present

## 2022-05-19 DIAGNOSIS — M109 Gout, unspecified: Secondary | ICD-10-CM | POA: Diagnosis not present

## 2022-05-19 DIAGNOSIS — E039 Hypothyroidism, unspecified: Secondary | ICD-10-CM | POA: Diagnosis not present

## 2022-05-19 LAB — GLUCOSE, CAPILLARY
Glucose-Capillary: 137 mg/dL — ABNORMAL HIGH (ref 70–99)
Glucose-Capillary: 136 mg/dL — ABNORMAL HIGH (ref 70–99)

## 2022-05-19 NOTE — Consult Note (Signed)
   [facility] Inpatient Consult   05/19/2022  JANEIA SPANBAUER 01-23-1934 [id number]  [employer] [ACO] Patient: united HealthCare Medicare  Primary Care Provider:  Plotnikov, Evie Lacks, MD  at [facility]   Patient was reviewed for less than 30 days readmission high risk score length of stay and ongoing barriers to care for skilled nursing facility needs for disposition.  If the patient goes to a [facility] affiliated facility then, patient can be followed by [facility] PAC RN with traditional Medicare and approved Medicare Advantage plans.    Plan:   Patient for [facility].   For questions or referrals, please contact:   Natividad Brood, RN BSN [place]  [phone] business mobile phone Toll free office [phone]  *[place]  [phone] Fax number: [phone] [email or web address]

## 2022-05-19 NOTE — TOC Progression Note (Signed)
Transition of Care [facility]) - Progression Note    Patient Details  Name: Jessica [name] MRN: [id number] Date of Birth: [birth year]  Transition of Care [facility]) CM/SW [place], LCSW Phone Number: 05/19/2022, 10:29 AM  Clinical Narrative:    CSW made multiple attempts to reach [place] regarding SNF referral and was unsuccessful. Pt understand that at this moment [facility] is her only bed offer and has agreed to go. CSW informed pt and pt family that they can transfer from SNF to another SNF, if that is their desire.   CSW has called [employer] to start insurance auth for [facility]. Pending Josem Kaufmann number is [id number]. When Josem Kaufmann is approved, pt can dc to [facility].   TOC will continue to follow this admission.    Expected Discharge Plan: [place] Barriers to Discharge: Continued Medical Work up  Expected Discharge Plan and Services         Expected Discharge Date: 05/19/22                                     Social Determinants of Health (SDOH) Interventions [place]: No Food Insecurity (03/30/2022)  Housing: Low Risk  (03/30/2022)  Transportation Needs: No Transportation Needs (03/30/2022)  Utilities: Not At Risk (03/30/2022)  Alcohol Screen: Low Risk  (07/01/2021)  Depression (PHQ2-9): Low Risk  (07/01/2021)  Financial Resource Strain: Low Risk  (07/01/2021)  Physical Activity: Inactive (07/01/2021)  Social Connections: Socially Isolated (07/01/2021)  Stress: No Stress Concern Present (07/01/2021)  Tobacco Use: Low Risk  (05/14/2022)    Readmission Risk Interventions    04/21/2022    9:30 AM  Readmission Risk Prevention Plan  Transportation Screening Complete  Medication Review ([place]) Complete  PCP or Specialist appointment within 3-5 days of discharge Complete  HRI or [place] Complete  SW Recovery Care/Counseling Consult Complete  Palliative Care Screening Not  [place] Complete   [name], MSW, [place], LCASA Transitions of Care  Clinical Social Worker I

## 2022-05-19 NOTE — Progress Notes (Signed)
RN tried to called report twice to nursing home got transfer both times and was put on hold for almost 5 minutes.

## 2022-05-19 NOTE — TOC Transition Note (Signed)
Transition of Care [facility]) - CM/SW Discharge Note   Patient Details  Name: [name] MRN: [id number] Date of Birth: [birth year]  Transition of [facility]) CM/SW Contact:  Bjorn Pippin, LCSW Phone Number: 05/19/2022, 12:24 PM   Clinical Narrative:    Patient will DC to: [facility] Anticipated DC date: 05/19/2022 Family notified: Cyndie Mull niece  Transport by: Corey Harold   Per MD patient ready for DC to [facility]. RN, patient, patient's family, and facility notified of DC. Discharge Summary and FL2 sent to facility. RN to call report prior to discharge [phone]. DC packet on chart. Ambulance transport requested for patient.   CSW will sign off for now as social work intervention is no longer needed. Please consult [place] again if new needs arise.    Final next level of care: [place] Barriers to Discharge: No Barriers Identified   Patient Goals and CMS Choice      Discharge Placement                  Patient to be transferred to facility by: [name] Name of family member notified: [name] (Niece) Patient and family notified of of transfer: 05/19/22  Discharge Plan and Services Additional resources added to the After Visit Summary for                                       Social Determinants of Health (SDOH) Interventions SDOH Screenings   Food Insecurity: No Food Insecurity (03/30/2022)  Housing: Low Risk  (03/30/2022)  Transportation Needs: No Transportation Needs (03/30/2022)  Utilities: Not At Risk (03/30/2022)  Alcohol Screen: Low Risk  (07/01/2021)  Depression (PHQ2-9): Low Risk  (07/01/2021)  Financial Resource Strain: Low Risk  (07/01/2021)  Physical Activity: Inactive (07/01/2021)  Social Connections: Socially Isolated (07/01/2021)  Stress: No Stress Concern Present (07/01/2021)  Tobacco Use: Low Risk  (05/14/2022)     Readmission Risk Interventions    04/21/2022    9:30 AM  Readmission Risk Prevention Plan   Transportation Screening Complete  Medication Review ([place]) Complete  PCP or Specialist appointment within 3-5 days of discharge Complete  HRI or [place] Complete  SW Recovery Care/Counseling Consult Complete  [place] Complete    [name], MSW, [place], LCASA Transitions of Care  Clinical Social Worker I

## 2022-05-22 DIAGNOSIS — I4892 Unspecified atrial flutter: Secondary | ICD-10-CM | POA: Diagnosis not present

## 2022-05-22 DIAGNOSIS — G4700 Insomnia, unspecified: Secondary | ICD-10-CM | POA: Diagnosis not present

## 2022-05-22 DIAGNOSIS — F32A Depression, unspecified: Secondary | ICD-10-CM | POA: Diagnosis not present

## 2022-05-22 DIAGNOSIS — J449 Chronic obstructive pulmonary disease, unspecified: Secondary | ICD-10-CM | POA: Diagnosis not present

## 2022-05-22 DIAGNOSIS — I38 Endocarditis, valve unspecified: Secondary | ICD-10-CM | POA: Diagnosis not present

## 2022-05-22 DIAGNOSIS — I4891 Unspecified atrial fibrillation: Secondary | ICD-10-CM | POA: Diagnosis not present

## 2022-05-22 DIAGNOSIS — I10 Essential (primary) hypertension: Secondary | ICD-10-CM | POA: Diagnosis not present

## 2022-05-22 DIAGNOSIS — E119 Type 2 diabetes mellitus without complications: Secondary | ICD-10-CM | POA: Diagnosis not present

## 2022-05-22 DIAGNOSIS — E039 Hypothyroidism, unspecified: Secondary | ICD-10-CM | POA: Diagnosis not present

## 2022-05-22 DIAGNOSIS — I5032 Chronic diastolic (congestive) heart failure: Secondary | ICD-10-CM | POA: Diagnosis not present

## 2022-05-22 DIAGNOSIS — K219 Gastro-esophageal reflux disease without esophagitis: Secondary | ICD-10-CM | POA: Diagnosis not present

## 2022-05-23 ENCOUNTER — Ambulatory Visit: Payer: Medicare Other | Attending: Internal Medicine

## 2022-05-24 DIAGNOSIS — R5381 Other malaise: Secondary | ICD-10-CM | POA: Diagnosis not present

## 2022-05-24 DIAGNOSIS — R296 Repeated falls: Secondary | ICD-10-CM | POA: Diagnosis not present

## 2022-05-25 DIAGNOSIS — E119 Type 2 diabetes mellitus without complications: Secondary | ICD-10-CM | POA: Diagnosis not present

## 2022-05-25 DIAGNOSIS — R5381 Other malaise: Secondary | ICD-10-CM | POA: Diagnosis not present

## 2022-05-25 DIAGNOSIS — I5032 Chronic diastolic (congestive) heart failure: Secondary | ICD-10-CM | POA: Diagnosis not present

## 2022-05-25 DIAGNOSIS — I4892 Unspecified atrial flutter: Secondary | ICD-10-CM | POA: Diagnosis not present

## 2022-05-26 NOTE — Telephone Encounter (Signed)
Printed for Dr Oval Linsey to sign when she returns to office, working at the hospital today and next week

## 2022-05-29 DIAGNOSIS — G4700 Insomnia, unspecified: Secondary | ICD-10-CM | POA: Diagnosis not present

## 2022-05-29 DIAGNOSIS — K219 Gastro-esophageal reflux disease without esophagitis: Secondary | ICD-10-CM | POA: Diagnosis not present

## 2022-05-29 DIAGNOSIS — I38 Endocarditis, valve unspecified: Secondary | ICD-10-CM | POA: Diagnosis not present

## 2022-05-29 DIAGNOSIS — I5032 Chronic diastolic (congestive) heart failure: Secondary | ICD-10-CM | POA: Diagnosis not present

## 2022-05-29 DIAGNOSIS — E119 Type 2 diabetes mellitus without complications: Secondary | ICD-10-CM | POA: Diagnosis not present

## 2022-05-29 DIAGNOSIS — R5381 Other malaise: Secondary | ICD-10-CM | POA: Diagnosis not present

## 2022-05-29 DIAGNOSIS — J449 Chronic obstructive pulmonary disease, unspecified: Secondary | ICD-10-CM | POA: Diagnosis not present

## 2022-05-29 DIAGNOSIS — E785 Hyperlipidemia, unspecified: Secondary | ICD-10-CM | POA: Diagnosis not present

## 2022-05-29 DIAGNOSIS — E039 Hypothyroidism, unspecified: Secondary | ICD-10-CM | POA: Diagnosis not present

## 2022-06-05 ENCOUNTER — Telehealth: Payer: Self-pay | Admitting: Internal Medicine

## 2022-06-05 MED ORDER — EMPAGLIFLOZIN 10 MG PO TABS: 10.0000 mg | ORAL_TABLET | Freq: Every day | ORAL | 3 refills | Status: DC

## 2022-06-05 NOTE — Addendum Note (Signed)
Addended by: Alvina Filbert B on: 06/05/2022 10:06 AM   Modules accepted: Orders

## 2022-06-05 NOTE — Telephone Encounter (Signed)
For our records: [facility] states the pt's daughter had her removed from their care this morning against the facilities recommendation and took the pt home. The daughter recently had an amputation and is not able to be a full time care giver, and [facility] is worried the pt will be left alone for long periods of time.   The pt has dementia and is often confused and may not be able to keep track of her medications, the nursing home was particularly concerned about her eliquis.   [place] told the pt's daughter to follow up with her PCP and called for an adult welfare check but wanted [place] to be aware of these updates.   Please call Brand at [facility] for any additional information   [phone]

## 2022-06-07 ENCOUNTER — Telehealth: Payer: Self-pay | Admitting: Cardiovascular Disease

## 2022-06-07 NOTE — Telephone Encounter (Signed)
Pt called regarding her home health since discharge from [facility].  Returned call to patient and provided instruction to call [facility] regarding [employer] arrangements.  Pt verbalized a lot of frustration with the facility.  Writer explained it has only been 2 days since her discharge from [place] and it can take a few days before she hears from the [place] agency.  Pt is going to call [facility]. Writer placed call to facility and requested they contact the patient. Per the facility she left AMA.  Message left requesting facility contact the patient with home health information. Called patient back to explain the facility stated she left against medical advice.  Patient states she did not  know she was leaving against medical advice until police officers showed up at her home and told her. Encouraged patient to contact the facility and ask to speak with an Scientist, physiological tomorrow.  Georgana Curio MHA RN CCM

## 2022-06-07 NOTE — Telephone Encounter (Signed)
Noted.  Can you refer Jessica [name] to [facility]?  Thanks

## 2022-06-07 NOTE — Telephone Encounter (Signed)
Yes, I went ahead and placed the referral for [facility].../l,mb

## 2022-06-07 NOTE — Telephone Encounter (Signed)
Patient called stating she was in the hospital and rehab for 2 months, they told her they would help her set up home health but no has been out to see her.  She states she in a wheel chair.  She said he needs help setting up home health.

## 2022-06-09 NOTE — Telephone Encounter (Signed)
Okay. Thank you.

## 2022-06-12 ENCOUNTER — Telehealth: Payer: Self-pay | Admitting: Internal Medicine

## 2022-06-12 NOTE — Telephone Encounter (Signed)
Okay to renew.  Schedule follow-up office visit with me.  Thank you

## 2022-06-12 NOTE — Telephone Encounter (Signed)
We can order RN home visits and physical/Occupational Therapy at home.  Home health aides are usually not covered by Medicare.  We need to have office visit within 90 days to be able to order home services.  The patient needs office visit.  Thank you

## 2022-06-12 NOTE — Telephone Encounter (Signed)
Prescription Request  06/12/2022  LOV: 12/26/2021  What is the name of the medication or equipment? cefadroxil (DURICEF) 500 MG capsule   Have you contacted your pharmacy to request a refill? No   Which pharmacy would you like this sent to?  [id number] - [address], [address] [address] Phone: [phone] Fax: [phone]    Patient notified that their request is being sent to the clinical staff for review and that they should receive a response within 2 business days.   Please advise at Mobile [phone] (mobile)

## 2022-06-12 NOTE — Telephone Encounter (Signed)
Patient wants to know what she needs to do to get home help - states that she is weak.  Please call patient:  [phone]

## 2022-06-12 NOTE — Telephone Encounter (Signed)
Pls advise provider never filled med.Marland KitchenJohny [name]

## 2022-06-13 MED ORDER — CEFADROXIL 500 MG PO CAPS: 1000.0000 mg | ORAL_CAPSULE | Freq: Two times a day (BID) | ORAL | 5 refills | Status: DC

## 2022-06-13 NOTE — Telephone Encounter (Signed)
Pt has appt 3/25/241.Marland KitchenJohny [name]

## 2022-06-13 NOTE — Telephone Encounter (Signed)
Notified pt w/MD response. Pt has appt on 06/26/22.Marland KitchenJohny [name]

## 2022-06-15 ENCOUNTER — Telehealth: Payer: Self-pay | Admitting: *Deleted

## 2022-06-15 NOTE — Progress Notes (Signed)
  Care Coordination  Outreach Note  06/15/2022 Name: Jessica [name] MRN: [id number] DOB: 1933-05-22   Care Coordination Outreach Attempts: An unsuccessful telephone outreach was attempted today to offer the patient information about available care coordination services as a benefit of their health plan.   Follow Up Plan:  Additional outreach attempts will be made to offer the patient care coordination information and services.   Encounter Outcome:  No Answer  [place]  Direct Dial: [phone]

## 2022-06-16 NOTE — Progress Notes (Signed)
  Care Coordination   Note   06/16/2022 Name: Jessica [name] MRN: [id number] DOB: Dec 04, 1933  [name] is a 89 y.o. year old female who sees Plotnikov, Evie Lacks, MD for primary care. I reached out to [name] by phone today to offer care coordination services.  [name] was given information about Care Coordination services today including:   The Care Coordination services include support from the care team which includes your Nurse Coordinator, Clinical Social Worker, or Pharmacist.  The Care Coordination team is here to help remove barriers to the health concerns and goals most important to you. Care Coordination services are voluntary, and the patient may decline or stop services at any time by request to their care team member.   Care Coordination Consent Status: Patient agreed to services and verbal consent obtained.   Follow up plan:  Telephone appointment with care coordination team member scheduled for:  06/19/22  Encounter Outcome:  Pt. Scheduled  [place]  Direct Dial: [phone]

## 2022-06-16 NOTE — Telephone Encounter (Signed)
Received notification from [facility] that patient may qualify for medication through low income subsidy  Spoke with niece Gaynelle Arabian who is contact person on forms Explained to [name] she would need to reach out to [employer] senior resources to discuss SHIPP, number and extension given to patient.  Advised to let us know if approved or denied. If denies will need copy of denial to fax to patient assistance  [name] verbalized understanding

## 2022-06-19 ENCOUNTER — Telehealth: Payer: Self-pay | Admitting: *Deleted

## 2022-06-19 ENCOUNTER — Ambulatory Visit: Payer: Self-pay | Admitting: Licensed Clinical Social Worker

## 2022-06-19 DIAGNOSIS — Z139 Encounter for screening, unspecified: Secondary | ICD-10-CM

## 2022-06-19 NOTE — Patient Instructions (Signed)
Visit Information  Thank you for taking time to visit with me today. Please don't hesitate to contact me if I can be of assistance to you.   Following are the goals we discussed today:   Goals Addressed             This Visit's Progress    [place] PT so that she can walk again, and meal delivery       Activities and task to complete in order to accomplish goals.   Review private pay home care options provided and discussed I have scheduled a phone appointment with the [place] she will provide educational informational and assist you with managing your health needs I have placed a referral to the community resource care guide they will call you with post discharge meal options  I have placed a referral with Meals on Wheels via [place] [facility], you understand this is a long wait list, they will reach out to you. Feel free to follow up with them at [phone]         Our next appointment is by telephone on 3/27   Please call the care guide team at [phone] if you need to cancel or reschedule your appointment.    The patient verbalized understanding of instructions, educational materials, and care plan provided today and DECLINED offer to receive copy of patient instructions, educational materials, and care plan.   Casimer Lanius, [place] [phone]

## 2022-06-19 NOTE — Patient Outreach (Signed)
  Care Coordination   Initial Visit Note   06/19/2022 Name: Jessica [name] MRN: [id number] DOB: 12-Jan-1934  Jessica [name] is a 89 y.o. year old female who sees Plotnikov, Evie Lacks, MD for primary care. I spoke with  [name] by phone today.  What matters to the patients health and wellness today?  Being able to walk  Patient reports her main concern is getting PT and also interested in meal delivery program, reports no concern with medication , niece gives her a bath, dau. Pays neighbor to sit with her. She declines consideration for going back to a facility states she wants to remain home.  Reviewed all options with patient and asked if she would like anyone else on the call to support her during our conversation. She declined.    Goals Addressed             This Visit's Progress    [place] PT so that she can walk again, and meal delivery       Activities and task to complete in order to accomplish goals.   Review private pay home care options provided and discussed I have scheduled a phone appointment with the [place] she will provide educational informational and assist you with managing your health needs I have placed a referral to the community resource care guide they will call you with post discharge meal options  I have placed a referral with Meals on Wheels via [place] [facility], you understand this is a long wait list, they will reach out to you. Feel free to follow up with them at [phone]        SDOH assessments and interventions completed:  Yes  SDOH Interventions Today    Flowsheet Row Most Recent Value  SDOH Interventions   Food Insecurity Interventions [id number] Referral, Other (Comment)  [meals on wheels ,  referral to care guide for mom's meals with insurance provider]  Housing Interventions Intervention Not Indicated  Transportation Interventions Intervention Not Indicated  Utilities Interventions Intervention Not Indicated   Financial Strain Interventions Intervention Not Indicated       Care Coordination Interventions:  Yes, provided  Interventions Today    Flowsheet Row Most Recent Value  Chronic Disease   Chronic disease during today's visit Diabetes, Congestive Heart Failure (CHF)  General Interventions   General Interventions Discussed/Reviewed General Interventions Discussed, Level of Care, Communication with, Referral to Nurse, Community Resources  [referral to care guide for post discharge meals]  Level of [place], [place]  [reviewed options ,  declined returning to facility ,  reports neice assist her with a bath and daughter is paying the neighbor to sit with patient so that she is not alone.]  Education Interventions   Education Provided Provided Education  Provided Verbal Education On [employer]  Pharmacy Interventions   Pharmacy Dicussed/Reviewed Medication Adherence  [reports no concerns ,  she is taking all medications]  Safety Interventions   Safety Discussed/Reviewed Safety Discussed  [reports uses a lift chair and wheelchair]       Follow up plan:  Referral made to RN care manager, meals on wheels and care guide for post discharge meal options Follow up call scheduled for 06/28/22    Encounter Outcome:  Pt. Visit Completed   Casimer Lanius, [place] [phone]

## 2022-06-19 NOTE — Telephone Encounter (Signed)
   Telephone encounter was:  Successful.  06/19/2022 Name: Jessica [name] MRN: [id number] DOB: 12-Feb-1934  Jessica [name] is a 89 y.o. year old female who is a primary care patient of Plotnikov, Evie Lacks, MD . The community resource team was consulted for assistance with Advance guide performed the following interventions: Patient provided with information about care guide support team and interviewed to confirm resource needs. Patient needing DC meals from [facility] also provided moms meals due to her waitlisted on MOW ongoing for 6 months  Follow Up Plan:  No further follow up planned at this time. The patient has been provided with needed resources.  [place] [phone] [address] , [place] [address] Email : [email or web address]

## 2022-06-20 ENCOUNTER — Ambulatory Visit (INDEPENDENT_AMBULATORY_CARE_PROVIDER_SITE_OTHER): Payer: Medicare Other | Admitting: Internal Medicine

## 2022-06-20 ENCOUNTER — Telehealth: Payer: Self-pay | Admitting: *Deleted

## 2022-06-20 ENCOUNTER — Other Ambulatory Visit: Payer: Self-pay

## 2022-06-20 ENCOUNTER — Encounter: Payer: Self-pay | Admitting: Internal Medicine

## 2022-06-20 VITALS — BP 137/73 | HR 92 | Temp 97.4°F | Ht 62.0 in | Wt 211.0 lb

## 2022-06-20 DIAGNOSIS — I330 Acute and subacute infective endocarditis: Secondary | ICD-10-CM | POA: Diagnosis not present

## 2022-06-20 DIAGNOSIS — T827XXD Infection and inflammatory reaction due to other cardiac and vascular devices, implants and grafts, subsequent encounter: Secondary | ICD-10-CM

## 2022-06-20 MED ORDER — CEFADROXIL 500 MG PO CAPS: 500.0000 mg | ORAL_CAPSULE | Freq: Two times a day (BID) | ORAL | 2 refills | Status: AC

## 2022-06-20 NOTE — Telephone Encounter (Signed)
   Telephone encounter was:  Successful.  06/20/2022 Name: Jessica [name] MRN: [id number] DOB: Mar 17, 1934  Jessica [name] is a 89 y.o. year old female who is a primary care patient of Plotnikov, Evie Lacks, MD . The community resource team was consulted for assistance with Food   Care guide performed the following interventions: Patient provided with information about care guide support team and interviewed to confirm resource needs. Ordered Moms meals from [facility] also enrolled her in moms meals ongoing through [facility] as she is waitlisted on [facility]  Follow Up Plan:  No further follow up planned at this time. The patient has been provided with needed resources. [place] [phone] [address] , [place] [address] Email : [email or web address]

## 2022-06-20 NOTE — Telephone Encounter (Signed)
   Telephone encounter was:  Successful.  06/20/2022 Name: Jessica [name] MRN: [id number] DOB: 11-29-1933  [name] is a 89 y.o. year old female who is a primary care patient of Plotnikov, Evie Lacks, MD . The community resource team was consulted for assistance with [place] guide performed the following interventions: Patient provided with information about care guide support team and interviewed to confirm resource needs. let patient know she will be receiveing Moms meals from [place] [facility] for dc and also will start a 6 month through the thn nutrition pack age for her .  Follow Up Plan:  No further follow up planned at this time. The patient has been provided with needed resources.  [facility] [phone] [address] , [place] [address] Email : [email or web address]

## 2022-06-20 NOTE — Patient Instructions (Signed)
Will check blood tests today make sure your body is tolerating antibiotics   Make sure you take cefadroxil antibiotics 2 capsules in morning and 2 capsules at night   In 3 months will likely be able to decrease the dose   Let our clinic know if you need refill on the antibiotics   I have placed 90 day supplies to [facility] (1 month with 2 refill)   See [place] in 3 month

## 2022-06-20 NOTE — Progress Notes (Signed)
[name] for Infectious Disease  Patient Active Problem List   Diagnosis Date Noted   Hypokalemia 05/15/2022   Acute on chronic diastolic CHF (congestive heart failure) ([place]) 05/14/2022   ABLA (acute blood loss anemia) 04/20/2022   Elevated INR 04/20/2022   Prolonged QT interval 04/20/2022   Delirium 04/19/2022   History of bacteremia 04/18/2022   COVID-19 virus infection 04/18/2022   COPD with acute exacerbation ([place]) 04/18/2022   Dementia with behavioral disturbance ([place]) 04/18/2022   MSSA bacteremia 04/04/2022   Prosthetic valve endocarditis ([place]) 04/04/2022   Pacemaker infection ([place]) 04/04/2022   Sepsis ([place]) 03/28/2022   Altered mental status 03/28/2022   Hypovolemia 03/28/2022   AKI (acute kidney injury) ([place]) 03/28/2022   Severe sepsis with acute organ dysfunction ([place]) 03/28/2022   DNR (do not resuscitate) 03/28/2022   Hypotension 03/28/2022   Rectal bleeding    Pure hypercholesterolemia 01/28/2021   Fall 12/22/2020   Knee contusion 12/22/2020   Eczema 09/09/2019   Vertigo 03/05/2019   S/P TAVR (transcatheter aortic valve replacement) 01/14/2019   Presence of permanent cardiac pacemaker    Pulmonary nodule    Severe aortic stenosis    Atrial fibrillation and flutter ([place]) 07/05/2017   DOE (dyspnea on exertion) 04/18/2017   Chronic venous insufficiency 12/08/2016   Erythema nodosum 11/10/2016   Paronychia of finger, left 04/13/2016   Left carotid artery stenosis 09/24/2014   Diarrhea 07/12/2013   Foot pain, left 03/18/2013   Gastrocnemius strain 03/18/2013   GERD (gastroesophageal reflux disease) 08/18/2011   History of pulmonary embolism 10/2008   DUODENITIS, WITHOUT HEMORRHAGE 06/29/2008   Diverticulosis of colon without hemorrhage 06/29/2008   DM type 2, controlled, with complication ([place]) 99991111   Low back pain 10/15/2007   OBESITY, MORBID 07/16/2007   Grieving 05/15/2007   HLD (hyperlipidemia) 03/03/2007   Hypothyroidism  11/06/2006   Anxiety disorder 11/06/2006   CARPAL TUNNEL SYNDROME 11/06/2006   Essential hypertension 11/06/2006   HIATAL HERNIA 11/06/2006   Osteoarthritis 11/06/2006   History of endocarditis 11/06/2006      Subjective:    Patient ID: Jessica [name], female    DOB: May 11, 1933, 89 y.o.   MRN: [id number]  Chief Complaint  Patient presents with   Follow-up    HPI:  Jessica [name] is a 89 y.o. female here for hospital f/u tavr/pacer infection and mssa bacteremia  Patient is supposed to be on 6 weeks cefazolin until 2/8 and transitioned to cefadroxil indefinitely She was initially seen 03/29/22 by id D[place] Dates no vegetation on pacer but the tavr has vegetation Ct surgery evaluated and not surgical candidate She was put on rifampin together with cefazolin but couldn't tolerate rifampin due to severe nausea  She was sick with covid and had admission 04/20/22 when rifampin was stopped  05/09/22 id clinic She is here with her niece She is here for initial id clinic f/u She is still on 2 liters of oxygen. Patient is in a skilled nursing home She remains weak and she is not able to work as much as she wants with pt/ot. She is walking a few feet with FWW and help No issue with rue picc site No joint pain/back pain No diarrhea or rash Nausea and appetite better since rifampin stopped. There are days that she don't even have nausea. 2-3/10 at worse about 3 times a week  06/20/22 id clinic f/u Doing very well on cefadroxil 1000 mg (2x 500 mg capsules) bid No n/v/diarrhea/rash  No f/c No focal pain Accompanied by her caregiver today She left ama from snf about 3 weeks ago and is home   Allergies  Allergen Reactions   Coreg [Carvedilol] Other (See Comments)    Weak legs   Relafen [Nabumetone] Other (See Comments)    Upset stomach   Calcium Other (See Comments)    Unknown reaction   Codeine Other (See Comments)    Unknown reaction   Lipitor [Atorvastatin] Other (See Comments)     Myalgias   Pacerone [Amiodarone] Other (See Comments)    Hand tremors   Vasotec [Enalapril] Cough   Vioxx [Rofecoxib] Other (See Comments)    Unknown reaction   Zocor [Simvastatin] Other (See Comments)    Myalgias      Outpatient Medications Prior to Visit  Medication Sig Dispense Refill   albuterol (VENTOLIN HFA) 108 (90 Base) MCG/ACT inhaler Inhale 2 puffs into the lungs every 6 (six) hours as needed for wheezing or shortness of breath. 8 g 2   allopurinol (ZYLOPRIM) 100 MG tablet TAKE 1 TABLET BY MOUTH DAILY (Patient taking differently: Take 100 mg by mouth daily.) 100 tablet 2   apixaban (ELIQUIS) 5 MG TABS tablet Take 1 tablet (5 mg total) by mouth 2 (two) times daily. 180 tablet 0   b complex vitamins tablet Take 1 tablet by mouth daily.     cefadroxil (DURICEF) 500 MG capsule Take 2 capsules (1,000 mg total) by mouth 2 (two) times daily. 60 capsule 5   Cholecalciferol 1000 UNITS tablet Take 1,000 Units by mouth daily.     empagliflozin (JARDIANCE) 10 MG TABS tablet Take 1 tablet (10 mg total) by mouth daily. 90 tablet 3   ezetimibe (ZETIA) 10 MG tablet TAKE 1 TABLET BY MOUTH DAILY 90 tablet 2   furosemide (LASIX) 40 MG tablet Take 1 tablet (40 mg total) by mouth daily. 200 tablet 2   levothyroxine (SYNTHROID) 75 MCG tablet TAKE 1 TABLET BY MOUTH DAILY (Patient taking differently: Take by mouth daily before breakfast.) 100 tablet 2   metFORMIN (GLUCOPHAGE) 500 MG tablet TAKE 1 TABLET BY MOUTH TWICE  DAILY WITH MEALS 200 tablet 2   metoprolol tartrate (LOPRESSOR) 25 MG tablet Take 1 tablet (25 mg total) by mouth 2 (two) times daily. 180 tablet 0   Multiple Vitamin (MULTIVITAMIN) tablet Take 1 tablet by mouth daily.     nystatin powder Apply 1 Application topically daily. Apply to under breast topically every day shift for redness/moisture.     repaglinide (PRANDIN) 2 MG tablet TAKE 2 TABLETS BY MOUTH 3  TIMES DAILY BEFORE MEALS (Patient taking differently: Take 2 mg by mouth 3  (three) times daily before meals.) 540 tablet 3   rosuvastatin (CRESTOR) 10 MG tablet TAKE 1 TABLET BY MOUTH UP TO 3  TIMES WEEKLY AS TOLERATED (Patient taking differently: Take 10 mg by mouth every Monday, Wednesday, and Friday.) 43 tablet 2   saccharomyces boulardii (FLORASTOR) 250 MG capsule Take 250 mg by mouth 2 (two) times daily.     sacubitril-valsartan (ENTRESTO) 97-103 MG Take 1 tablet by mouth 2 (two) times daily. 180 tablet 3   sodium chloride flush (NS) 0.9 % SOLN Inject 10 mLs into the vein 3 (three) times daily. Until 02.08.2024     spironolactone (ALDACTONE) 25 MG tablet Take 0.5 tablets (12.5 mg total) by mouth daily. 30 tablet    temazepam (RESTORIL) 7.5 MG capsule Take 7.5 mg by mouth at bedtime.     venlafaxine XR (EFFEXOR-XR)  75 MG 24 hr capsule TAKE 1 CAPSULE BY MOUTH  DAILY WITH BREAKFAST (Patient taking differently: Take 75 mg by mouth daily.) 90 capsule 3   famotidine (PEPCID) 20 MG tablet Take 1 tablet (20 mg total) by mouth daily. 30 tablet 0   Heparin Sod, Pork, Lock Flush (HEPARIN LOCK FLUSH IV) Inject 10 mLs into the vein 3 (three) times daily.     No facility-administered medications prior to visit.     Social History   Socioeconomic History   Marital status: Widowed    Spouse name: Not on file   Number of children: 3   Years of education: Not on file   Highest education level: Not on file  Occupational History   Not on file  Tobacco Use   Smoking status: Never   Smokeless tobacco: Never  Vaping Use   Vaping Use: Never used  Substance and Sexual Activity   Alcohol use: No    Alcohol/week: 0.0 standard drinks of alcohol   Drug use: No   Sexual activity: Not Currently  Other Topics Concern   Not on file  Social History Narrative   Opth - Dr Leonie Man   Retired, Looking after great-grand baby; lives w/son   Daily Caffeine Use - 1   Widow - suicide September 08, 2008; 2 daughters died      lost brother and son 07/16/12; spouse died 07-16-08   Have 4 children; 3 have  expired,  now has one; dtr; copes by reading    Father had stroke at 46 and mother had heart disease; MI at 58  but lived to be 39.    Son died quickly of lung cancer   [place]; the oldest had aneurysm   Youngest dtr 75 and died of MI   Twin sister dx with Alzheimer's today/    Will give information regarding Alz resources   Social Determinants of Health   Financial Resource Strain: Low Risk  (06/19/2022)   Overall Financial Resource Strain (CARDIA)    Difficulty of Paying Living Expenses: Not very hard  Food Insecurity: No Food Insecurity (06/19/2022)   Hunger Vital Sign    Worried About Running Out of Food in the Last Year: Never true    Ran Out of Food in the Last Year: Never true  Transportation Needs: No Transportation Needs (06/19/2022)   PRAPARE - Hydrologist (Medical): No    Lack of Transportation (Non-Medical): No  Physical Activity: Inactive (07/01/2021)   Exercise Vital Sign    Days of Exercise per Week: 0 days    Minutes of Exercise per Session: 0 min  Stress: No Stress Concern Present (07/01/2021)   [place]    Feeling of Stress : Not at all  Social Connections: Socially Isolated (07/01/2021)   Social Connection and Isolation Panel [NHANES]    Frequency of Communication with Friends and Family: Twice a week    Frequency of Social Gatherings with Friends and Family: Twice a week    Attends Religious Services: Never    Marine scientist or Organizations: No    Attends Archivist Meetings: Never    Marital Status: Widowed  Intimate Partner Violence: Not At Risk (03/30/2022)   Humiliation, Afraid, Rape, and Kick questionnaire    Fear of Current or Ex-Partner: No    Emotionally Abused: No    Physically Abused: No    Sexually Abused: No      Review  of Systems    All other ros negative  Objective:    BP 137/73   Pulse 92   Temp (!) 97.4 F (36.3 C)  (Temporal)   Ht 5\' 2"  (1.575 m)   Wt 211 lb (95.7 kg)   SpO2 97%   BMI 38.59 kg/m  Nursing note and vital signs reviewed.  Physical Exam  General/constitutional: no distress, pleasant, conversant; in wheel chair HEENT: Normocephalic, PER, Conj Clear, EOMI, Oropharynx clear Neck supple CV: rrr no mrg Lungs: clear to auscultation, normal respiratory effort Abd: Soft, Nontender Ext: no edema Skin: No Rash Neuro: nonfocal MSK: no peripheral joint swelling/tenderness/warmth; back spines nontender      Labs: 1/29 nursing home labs Cbc 6/9/355; cr 0.86; lft 31/10/71/0.3  Micro:  Serology:  Imaging:  Assessment & Plan:   Problem List Items Addressed This Visit       Other   Pacemaker infection ([place])   Relevant Medications   cefadroxil (DURICEF) 500 MG capsule   Other Relevant Orders   CBC w/Diff   COMPLETE METABOLIC PANEL WITH GFR   Other Visit Diagnoses     Bacterial endocarditis, unspecified chronicity    -  Primary   Relevant Medications   cefadroxil (DURICEF) 500 MG capsule   Other Relevant Orders   CBC w/Diff   COMPLETE METABOLIC PANEL WITH GFR        No orders of the defined types were placed in this encounter.    Abx: 03/29/22-2/6 Cefazolin  04/06/22-04/20/22 rifampin                                                   Assessment: Mssa bsi, tavr/pacer infection    Patient recent admission for mssa process, d/c'ed to snf on opat for 6 weeks rifampin/cefaz and continued on indefinite cefadroxil, admitted 1/16 for ams/weakness, diarrhea, worsening nausea found to have covid infection   Recent admission w/u --> MSSA prosthetic valve endocarditis involving TAVR and presumed PPM involvement (though no observed vegetation on TEE).    In reviewing her nausea it seems she has had that since last admission and given aki on presentation, concern this is rather more severe than average. I suspect the rifampin is inducing this. This was started in setting  her cardiac device unable to be extracted. Given unclear benefit and limiting side effect will hold rifampin for now. Will finish cefazolin on 05/11/22 and continue suppressive abx with cefadroxil 1 gram bid indefinitely     Now that she is off rifampin, her previous DOAC can be resumed    Covid management per primary team   --------- 05/09/22 Doing well Nausea basically resolved and appetite better since rifampin stopped 04/20/22 Recovering from covid infection still weak and on 2 liters o2 No concerning changes in terms of tavr/pacer infection  Finish cefazolin on 2/8 Start cefadroxil 1 gram twice a day on 05/12/22 F/u 6 weeks  3/19 id assessment Doing well Will get labs to monitor toxicity long term abx In 3 months if things go well will decrease to 500 mg po bid Refill cefadroxil rx   Follow-up: Return in about 3 months (around 09/20/2022).      Jabier Mutton, [facility] for Infectious Disease [facility] 06/20/2022, 2:00 PM

## 2022-06-21 ENCOUNTER — Ambulatory Visit: Payer: Self-pay

## 2022-06-21 LAB — COMPLETE METABOLIC PANEL WITH GFR
AG Ratio: 0.8 (calc) — ABNORMAL LOW (ref 1.0–2.5)
ALT: 21 U/L (ref 6–29)
AST: 34 U/L (ref 10–35)
Albumin: 3.8 g/dL (ref 3.6–5.1)
Alkaline phosphatase (APISO): 82 U/L (ref 37–153)
BUN/Creatinine Ratio: 17 (calc) (ref 6–22)
BUN: 19 mg/dL (ref 7–25)
CO2: 27 mmol/L (ref 20–32)
Calcium: 9.8 mg/dL (ref 8.6–10.4)
Chloride: 100 mmol/L (ref 98–110)
Creat: 1.09 mg/dL — ABNORMAL HIGH (ref 0.60–0.95)
Globulin: 4.6 g/dL (calc) — ABNORMAL HIGH (ref 1.9–3.7)
Glucose, Bld: 154 mg/dL — ABNORMAL HIGH (ref 65–99)
Potassium: 5 mmol/L (ref 3.5–5.3)
Sodium: 137 mmol/L (ref 135–146)
Total Bilirubin: 0.4 mg/dL (ref 0.2–1.2)
Total Protein: 8.4 g/dL — ABNORMAL HIGH (ref 6.1–8.1)
eGFR: 49 mL/min/{1.73_m2} — ABNORMAL LOW (ref >=60)

## 2022-06-21 LAB — CBC WITH DIFFERENTIAL/PLATELET
Absolute Monocytes: 1014 cells/uL — ABNORMAL HIGH (ref 200–950)
Basophils Absolute: 28 cells/uL (ref 0–200)
Basophils Relative: 0.4 %
Eosinophils Absolute: 48 cells/uL (ref 15–500)
Eosinophils Relative: 0.7 %
HCT: 37 % (ref 35.0–45.0)
Hemoglobin: 11.8 g/dL (ref 11.7–15.5)
Lymphs Abs: 2498 cells/uL (ref 850–3900)
MCH: 30.4 pg (ref 27.0–33.0)
MCHC: 31.9 g/dL — ABNORMAL LOW (ref 32.0–36.0)
MCV: 95.4 fL (ref 80.0–100.0)
MPV: 9.5 fL (ref 7.5–12.5)
Monocytes Relative: 14.7 %
Neutro Abs: 3312 cells/uL (ref 1500–7800)
Neutrophils Relative %: 48 %
Platelets: 293 10*3/uL (ref 140–400)
RBC: 3.88 10*6/uL (ref 3.80–5.10)
RDW: 13.4 % (ref 11.0–15.0)
Total Lymphocyte: 36.2 %
WBC: 6.9 10*3/uL (ref 3.8–10.8)

## 2022-06-21 NOTE — Patient Outreach (Signed)
  Care Coordination   Initial Visit Note   06/21/2022 Name: Jessica [name] MRN: [id number] DOB: [birth year]  Jessica [name] is a 89 y.o. year old female who sees Plotnikov, Evie Lacks, MD for primary care. I spoke with  [name] by phone today.  What matters to the patients health and wellness today?  Per review of chart: 03/28/22-04/19/22 MSSA Bacteremia, prosthetic valve endocarditis, pacemaker infection, AMS, AKI. 04/18/22-04/24/22 AKI. 04/25/22 ED for chest pain. 05/14/22-05/19/22 acute on chronic CHF-Patient transferred to SNF. Recently transitioned to home from SNF. Patient reports niece comes to check in her and was there on yesterday. Office visit with ID completed on 06/20/22. She expresses that she  has lost 3 children and has one living that has health challenges and unable to assist her. She states she was walking prior to hospitalizations and now is primarily using a wheelchair. No home health involved at this time. She states there are events and periods of time that she does not remember since Christmas. She reports neighbor next door stays with her every night. Upcoming appointment with PCP on 06/26/22. She reports she has transportation to appointment. Per review of chart, RNCM noted patient has not had follow up with cardiologist post hospital visit for CHF-discharged to SNF on 05/19/22.  Goals Addressed             This Visit's Progress    Care Coordination-assist with health management       Interventions Today    Flowsheet Row Most Recent Value  Chronic Disease   Chronic disease during today's visit Diabetes, Congestive Heart Failure (CHF), Other  [recent discharge from Skilled facility]  General Interventions   General Interventions Discussed/Reviewed General Interventions Discussed, Doctor Visits  Doctor Visits Discussed/Reviewed Doctor Visits Discussed  PCP/Specialist Visits Compliance with follow-up visit  [facility] called to schedule cardiology visit for patient:  07/14/22 at 10:30 am at the [place] location. patient called-message left]  Exercise Interventions   Exercise Discussed/Reviewed Assistive device use and maintanence  Education Interventions   Education Provided Provided Education  Provided Verbal Education On Blood Sugar Monitoring, Medication, Nutrition  [reiterated moms meals arrange per [place] Discussed/Reviewed Other  [patient reports it is hard when others in family have passed on. encouraged to also discuss feelings with LCSW at call next week.]  Nutrition Interventions   Nutrition Discussed/Reviewed Decreasing salt  [encouraged to montior salt intake]  Pharmacy Interventions   Pharmacy Dicussed/Reviewed Pharmacy Topics Discussed, Medications and their functions, Medication Adherence, Affording Medications  Safety Interventions   Safety Discussed/Reviewed Safety Discussed  [reiterated change position slowly, encouraged to follow up with provider on Monday regarding [employer] therapy]            SDOH assessments and interventions completed:  No  Care Coordination Interventions:  Yes, provided   Follow up plan: Follow up call scheduled for 07/05/22    Encounter Outcome:  Pt. Visit Completed   Thea Silversmith, RN, MSN, BSN, [place] Coordinator [phone]

## 2022-06-21 NOTE — Patient Instructions (Signed)
Visit Information  Thank you for taking time to visit with me today. Please don't hesitate to contact me if I can be of assistance to you.   Following are the goals we discussed today:   Goals Addressed             This Visit's Progress    Care Coordination-assist with health management       Interventions Today    Flowsheet Row Most Recent Value  Chronic Disease   Chronic disease during today's visit Diabetes, Congestive Heart Failure (CHF), Other  [recent discharge from Skilled facility]  General Interventions   General Interventions Discussed/Reviewed General Interventions Discussed  Exercise Interventions   Exercise Discussed/Reviewed Assistive device use and maintanence  Education Interventions   Education Provided Provided Education  Provided Verbal Education On Blood Sugar Monitoring, Medication, Nutrition  [reiterated moms meals arrange per [place] Discussed/Reviewed Other  [patient reports it is hard when others in family have passed on. encouraged to also discuss feelings with LCSW at call next week.]  Nutrition Interventions   Nutrition Discussed/Reviewed Decreasing salt  [encouraged to montior salt intake]  Pharmacy Interventions   Pharmacy Dicussed/Reviewed Pharmacy Topics Discussed, Medications and their functions, Medication Adherence, Affording Medications  Safety Interventions   Safety Discussed/Reviewed Safety Discussed  [reiterated change position slowly, encouraged to follow up with provider on Monday regarding [place] therapy]            Our next appointment is by telephone on 07/05/22 at 10:00 am  Please call the care guide team at [phone] if you need to cancel or reschedule your appointment.   If you are experiencing a Mental Health or War or need someone to talk to, please call the Suicide and Crisis Lifeline: [place], RN, MSN, BSN, [employer] [phone]

## 2022-06-26 ENCOUNTER — Ambulatory Visit (INDEPENDENT_AMBULATORY_CARE_PROVIDER_SITE_OTHER): Payer: Medicare Other | Admitting: Internal Medicine

## 2022-06-26 ENCOUNTER — Encounter: Payer: Self-pay | Admitting: Internal Medicine

## 2022-06-26 VITALS — BP 102/70 | HR 88 | Temp 98.6°F | Ht 62.0 in | Wt 217.0 lb

## 2022-06-26 DIAGNOSIS — I5033 Acute on chronic diastolic (congestive) heart failure: Secondary | ICD-10-CM

## 2022-06-26 DIAGNOSIS — I4892 Unspecified atrial flutter: Secondary | ICD-10-CM | POA: Diagnosis not present

## 2022-06-26 DIAGNOSIS — E118 Type 2 diabetes mellitus with unspecified complications: Secondary | ICD-10-CM

## 2022-06-26 DIAGNOSIS — I10 Essential (primary) hypertension: Secondary | ICD-10-CM | POA: Diagnosis not present

## 2022-06-26 DIAGNOSIS — I4891 Unspecified atrial fibrillation: Secondary | ICD-10-CM

## 2022-06-26 DIAGNOSIS — I38 Endocarditis, valve unspecified: Secondary | ICD-10-CM | POA: Diagnosis not present

## 2022-06-26 DIAGNOSIS — T826XXD Infection and inflammatory reaction due to cardiac valve prosthesis, subsequent encounter: Secondary | ICD-10-CM | POA: Diagnosis not present

## 2022-06-26 DIAGNOSIS — A419 Sepsis, unspecified organism: Secondary | ICD-10-CM | POA: Diagnosis not present

## 2022-06-26 DIAGNOSIS — R6520 Severe sepsis without septic shock: Secondary | ICD-10-CM

## 2022-06-26 DIAGNOSIS — F419 Anxiety disorder, unspecified: Secondary | ICD-10-CM

## 2022-06-26 NOTE — Assessment & Plan Note (Signed)
Continue on Entresto, metoprolol 

## 2022-06-26 NOTE — Assessment & Plan Note (Signed)
Chronic  ?Continue on lorazepam prn ? Potential benefits of a long term prn benzodiazepines  use as well as potential risks  and complications were explained to the patient and were aknowledged. ?

## 2022-06-26 NOTE — Assessment & Plan Note (Signed)
Per Dr Gale Journey: "cefadroxil antibiotics 2 capsules in morning and 2 capsules at night. In 3 months will likely be able to decrease the dose"

## 2022-06-26 NOTE — Assessment & Plan Note (Signed)
CBGs are ?

## 2022-06-26 NOTE — Assessment & Plan Note (Signed)
Cont on Toprol, Eliquis

## 2022-06-26 NOTE — Assessment & Plan Note (Signed)
Chronic Not well controlled Risks associated with diet and treatment noncompliance were discussed. Compliance was encouraged. D/c on Triamt/HCTZ, Losartan Continue on Entresto, Metoprolol

## 2022-06-26 NOTE — Progress Notes (Signed)
Subjective:  Patient ID: Jessica [name], female    DOB: [birth year]  Age: 89 y.o. MRN: [id number]  CC: Follow-up (HH is needed, OT and PT is needed and help with ADL's 5 day's a week.)   HPI Jessica [name] presents for FTF, CHF, sepsis  Per Jessica [name]: "cefadroxil antibiotics 2 capsules in morning and 2 capsules at night. In 3 months will likely be able to decrease the dose"    "Admit date: 05/14/2022 Discharge date: 05/19/2022   Time spent: 45 minutes   Recommendations for Outpatient Follow-up:  PCP in 1 to 2 weeks, please check BMP at follow-up Cardiology Jessica [name] in 3 weeks     Discharge Diagnoses:  Principal Problem:   Acute on chronic diastolic CHF (congestive heart failure) ([place]) Active Problems:   History of endocarditis   Essential hypertension   DM type 2, controlled, with complication ([place])   Hypokalemia   Dementia with behavioral disturbance (HCC)   Atrial fibrillation and flutter (HCC)   Hypothyroidism   GERD (gastroesophageal reflux disease)   DNR (do not resuscitate)   OBESITY, MORBID     Discharge Condition: Improved   Diet recommendation: Low-sodium, heart healthy        Filed Weights    05/17/22 0056 05/18/22 0500 05/19/22 0655  Weight: 105.1 kg 102.1 kg 101.5 kg      History of present illness:  [name] with history of diastolic CHF, type 2 diabetes mellitus, paroxysmal A-fib, PPM, aortic stenosis, TAVR, history of aortic valve endocarditis, recent hospitalization 1/816-22 for hypotension and AKI in the setting of COVID, was then discharged to rehab, currently thereafter went back home few days prior and back to the ED with progressive weakness and difficulty ambulating. -In the emergency room she was noted to have considerable lower extremity edema, chest x-ray noted cardiomegaly pulmonary vascular congestion and bilateral pleural effusions, creatinine was 0.7, BNP 907, abnormal UA with greater than 50 WBCs   Hospital Course:    Acute on  chronic diastolic CHF  -2D echo noted EF of 55-60%, moderate LVH, preserved RV  -Diuresed with IV Lasix, 5.9 L negative, -Switched to oral Lasix -Continue Jardiance, Entresto, metoprolol, Aldactone -Has significant deconditioning and mild cognitive deficits, seen by PT OT, recommended SNF for short-term rehab -Recommend follow-up with Jessica [name] in 2 to 3 weeks   History of endocarditis Recent hospitalization for endocarditits - 3 cm vegetation on TVR prosthesis by TEE1/3/24. She has completed course of IV ancef and is on oral abx indefinitely.  -Continue with oral cefadroxil.    Abnormal urinalysis -Asymptomatic bacteriuria, monitor without antibiotics for this   Essential hypertension -Stable, meds as above   Hypokalemia Hypomagnesemia.  -Replaced   DM type 2, controlled, with complication (HCC) -CBGs stable   Dementia with behavioral disturbance (HCC) -Stable   Atrial fibrillation and flutter (HCC) Chronic atrial fibrillation  Patient with ventricular paced rhythm,  -Continue metoprolol, apixaban   GERD (gastroesophageal reflux disease) -Continue PPI   Hypothyroidism -Continue Synthroid   DNR (do not resuscitate)   OBESITY, MORBID Class 3 obesity with calculated BMI at 42.2 "    Outpatient Medications Prior to Visit  Medication Sig Dispense Refill   albuterol (VENTOLIN HFA) 108 (90 Base) MCG/ACT inhaler Inhale 2 puffs into the lungs every 6 (six) hours as needed for wheezing or shortness of breath. 8 g 2   allopurinol (ZYLOPRIM) 100 MG tablet TAKE 1 TABLET BY MOUTH DAILY (Patient taking differently: Take 100 mg by mouth daily.)  100 tablet 2   apixaban (ELIQUIS) 5 MG TABS tablet Take 1 tablet (5 mg total) by mouth 2 (two) times daily. 180 tablet 0   b complex vitamins tablet Take 1 tablet by mouth daily.     cefadroxil (DURICEF) 500 MG capsule Take 1 capsule (500 mg total) by mouth 2 (two) times daily. 60 capsule 2   Cholecalciferol 1000 UNITS tablet Take 1,000  Units by mouth daily.     empagliflozin (JARDIANCE) 10 MG TABS tablet Take 1 tablet (10 mg total) by mouth daily. 90 tablet 3   ezetimibe (ZETIA) 10 MG tablet TAKE 1 TABLET BY MOUTH DAILY 90 tablet 2   furosemide (LASIX) 40 MG tablet Take 1 tablet (40 mg total) by mouth daily. 200 tablet 2   levothyroxine (SYNTHROID) 75 MCG tablet TAKE 1 TABLET BY MOUTH DAILY (Patient taking differently: Take by mouth daily before breakfast.) 100 tablet 2   metFORMIN (GLUCOPHAGE) 500 MG tablet TAKE 1 TABLET BY MOUTH TWICE  DAILY WITH MEALS 200 tablet 2   metoprolol tartrate (LOPRESSOR) 25 MG tablet Take 1 tablet (25 mg total) by mouth 2 (two) times daily. 180 tablet 0   Multiple Vitamin (MULTIVITAMIN) tablet Take 1 tablet by mouth daily.     nystatin powder Apply 1 Application topically daily. Apply to under breast topically every day shift for redness/moisture.     repaglinide (PRANDIN) 2 MG tablet TAKE 2 TABLETS BY MOUTH 3  TIMES DAILY BEFORE MEALS (Patient taking differently: Take 2 mg by mouth 3 (three) times daily before meals.) 540 tablet 3   rosuvastatin (CRESTOR) 10 MG tablet TAKE 1 TABLET BY MOUTH UP TO 3  TIMES WEEKLY AS TOLERATED (Patient taking differently: Take 10 mg by mouth every Monday, Wednesday, and Friday.) 43 tablet 2   saccharomyces boulardii (FLORASTOR) 250 MG capsule Take 250 mg by mouth 2 (two) times daily.     sacubitril-valsartan (ENTRESTO) 97-103 MG Take 1 tablet by mouth 2 (two) times daily. 180 tablet 3   sodium chloride flush (NS) 0.9 % SOLN Inject 10 mLs into the vein 3 (three) times daily. Until 02.08.2024     spironolactone (ALDACTONE) 25 MG tablet Take 0.5 tablets (12.5 mg total) by mouth daily. 30 tablet    temazepam (RESTORIL) 7.5 MG capsule Take 7.5 mg by mouth at bedtime.     venlafaxine XR (EFFEXOR-XR) 75 MG 24 hr capsule TAKE 1 CAPSULE BY MOUTH  DAILY WITH BREAKFAST (Patient taking differently: Take 75 mg by mouth daily.) 90 capsule 3   famotidine (PEPCID) 20 MG tablet Take 1  tablet (20 mg total) by mouth daily. 30 tablet 0   Heparin Sod, Pork, Lock Flush (HEPARIN LOCK FLUSH IV) Inject 10 mLs into the vein 3 (three) times daily.     No facility-administered medications prior to visit.    ROS: Review of Systems  Constitutional:  Negative for activity change, appetite change, chills, fatigue and unexpected weight change.  HENT:  Negative for congestion, mouth sores and sinus pressure.   Eyes:  Negative for visual disturbance.  Respiratory:  Negative for cough and chest tightness.   Gastrointestinal:  Negative for abdominal pain and nausea.  Genitourinary:  Negative for difficulty urinating, frequency and vaginal pain.  Musculoskeletal:  Positive for gait problem. Negative for back pain.  Skin:  Negative for pallor and rash.  Neurological:  Negative for dizziness, tremors, weakness, numbness and headaches.  Psychiatric/Behavioral:  Negative for confusion, sleep disturbance and suicidal ideas.     Objective:  BP 102/70 (BP Location: Left Arm, Patient Position: Sitting, Cuff Size: Large)   Pulse 88   Temp 98.6 F (37 C) (Oral)   Ht 5\' 2"  (1.575 m)   Wt 217 lb (98.4 kg)   SpO2 95%   BMI 39.69 kg/m   BP Readings from Last 3 Encounters:  06/26/22 102/70  06/20/22 137/73  05/19/22 (!) 116/58    Wt Readings from Last 3 Encounters:  06/26/22 217 lb (98.4 kg)  06/20/22 211 lb (95.7 kg)  05/19/22 223 lb 12.3 oz (101.5 kg)    Physical Exam Constitutional:      General: She is not in acute distress.    Appearance: She is well-developed. She is obese.  HENT:     Head: Normocephalic.     Right Ear: External ear normal.     Left Ear: External ear normal.     Nose: Nose normal.  Eyes:     General:        Right eye: No discharge.        Left eye: No discharge.     Conjunctiva/sclera: Conjunctivae normal.     Pupils: Pupils are equal, round, and reactive to light.  Neck:     Thyroid: No thyromegaly.     Vascular: No JVD.     Trachea: No tracheal  deviation.  Cardiovascular:     Rate and Rhythm: Normal rate and regular rhythm.     Heart sounds: Normal heart sounds.  Pulmonary:     Effort: No respiratory distress.     Breath sounds: No stridor. No wheezing.  Abdominal:     General: Bowel sounds are normal. There is no distension.     Palpations: Abdomen is soft. There is no mass.     Tenderness: There is no abdominal tenderness. There is no guarding or rebound.  Musculoskeletal:        General: Tenderness present.     Cervical back: Normal range of motion and neck supple. No rigidity.  Lymphadenopathy:     Cervical: No cervical adenopathy.  Skin:    Findings: No erythema or rash.  Neurological:     Cranial Nerves: No cranial nerve deficit.     Motor: No abnormal muscle tone.     Coordination: Coordination abnormal.     Gait: Gait abnormal.     Deep Tendon Reflexes: Reflexes abnormal.  Psychiatric:        Behavior: Behavior normal.        Thought Content: Thought content normal.        Judgment: Judgment normal.     Lab Results  Component Value Date   WBC 6.9 06/20/2022   HGB 11.8 06/20/2022   HCT 37.0 06/20/2022   PLT 293 06/20/2022   GLUCOSE 154 (H) 06/20/2022   CHOL 207 (H) 07/12/2020   TRIG 277 (H) 07/12/2020   HDL 39 (L) 07/12/2020   LDLDIRECT 155.9 09/30/2012   LDLCALC 119 (H) 07/12/2020   ALT 21 06/20/2022   AST 34 06/20/2022   NA 137 06/20/2022   K 5.0 06/20/2022   CL 100 06/20/2022   CREATININE 1.09 (H) 06/20/2022   BUN 19 06/20/2022   CO2 27 06/20/2022   TSH 1.460 03/28/2022   INR 3.9 (H) 04/25/2022   HGBA1C 8.4 (H) 04/19/2022   MICROALBUR 4.5 (H) 12/02/2009    DG Hip Unilat With Pelvis 2-3 Views Left  Result Date: 05/14/2022 CLINICAL DATA:  Cough, weakness, hip pain EXAM: DG HIP (WITH OR WITHOUT PELVIS) 2-3V LEFT  COMPARISON:  None Available. FINDINGS: No acute fracture or dislocation. No aggressive osseous lesion. Normal alignment. Mild osteoarthritis of the left hip. Generalized osteopenia.  Soft tissue are unremarkable. No radiopaque foreign body or soft tissue emphysema. IMPRESSION: 1. Mild osteoarthritis of the left hip. Given the patient's age and osteopenia, if there is persistent clinical concern for an occult hip fracture, a MRI of the hip is recommended for increased sensitivity. Electronically Signed   By: Kathreen Devoid M.D.   On: 05/14/2022 09:35   DG Chest Port 1 View  Result Date: 05/14/2022 CLINICAL DATA:  Cough, pain EXAM: PORTABLE CHEST 1 VIEW COMPARISON:  04/25/2022 FINDINGS: Bilateral interstitial thickening. Small bilateral pleural effusions. Bibasilar atelectasis. No pneumothorax. Stable cardiomegaly. Prior TAVR. Dual lead cardiac pacemaker. No acute osseous abnormality. IMPRESSION: 1. Findings concerning for CHF. Electronically Signed   By: Kathreen Devoid M.D.   On: 05/14/2022 09:34    Assessment & Plan:   Problem List Items Addressed This Visit       Cardiovascular and Mediastinum   Prosthetic valve endocarditis ([place])    Per Jessica [name]: "cefadroxil antibiotics 2 capsules in morning and 2 capsules at night. In 3 months will likely be able to decrease the dose"      Essential hypertension - Primary    Chronic Not well controlled Risks associated with diet and treatment noncompliance were discussed. Compliance was encouraged. D/c on Triamt/HCTZ, Losartan Continue on Entresto, Metoprolol      Atrial fibrillation and flutter (HCC)    Cont on Toprol, Eliquis      Relevant Orders   Ambulatory referral to [facility]   Acute on chronic diastolic CHF (congestive heart failure) (HCC)    Continue on Entresto, metoprolol      Relevant Orders   Ambulatory referral to [facility]   DM type 2, controlled, with complication ([place])    CBGs are ?        Other   Severe sepsis with acute organ dysfunction ([place])    Per Jessica [name]: "cefadroxil antibiotics 2 capsules in morning and 2 capsules at night. In 3 months will likely be able to decrease the dose"       Relevant Orders   Ambulatory referral to [facility]    Chronic  Continue on lorazepam prn  Potential benefits of a long term prn benzodiazepines  use as well as potential risks  and complications were explained to the patient and were aknowledged.         No orders of the defined types were placed in this encounter.     Follow-up: Return in about 2 months (around 08/26/2022) for a follow-up visit.  Walker Kehr, MD

## 2022-06-26 NOTE — Patient Instructions (Signed)
Apple mini pod smart speaker, Google speaker 2 way cameras

## 2022-06-28 ENCOUNTER — Ambulatory Visit: Payer: Self-pay | Admitting: Licensed Clinical Social Worker

## 2022-06-28 ENCOUNTER — Telehealth: Payer: Self-pay

## 2022-06-28 NOTE — Telephone Encounter (Signed)
Contacted Jessica [name] to schedule their annual wellness visit. Appointment made for 07/04/22.  Norton Blizzard, [place] ([facility])  [place] [facility] [phone]

## 2022-06-28 NOTE — Patient Instructions (Signed)
Visit Information  Thank you for taking time to visit with me today. Please don't hesitate to contact me if I can be of assistance to you.   Following are the goals we discussed today:   Goals Addressed             This Visit's Progress    COMPLETED: [facility] so that she can walk again, and meal delivery       Activities and task to complete in order to accomplish goals.   Your Dr. Has placed an order for [facility] , OT as well as an Aide.  Per your request I have contacted [name] to place you on the waitlist for in-home aide services [facility] [phone]  Please keep phone appointment with the [place] she will provide educational informational and assist you with managing your health needs I have placed a referral with [employer] Whe[employer] via [facility] [employer], you understand this is a long wait list, they will reach out to you. Feel free to follow up with them at [phone]        Please call the care guide team at [phone] if you need to cancel or reschedule your appointment.    The patient verbalized understanding of instructions, educational materials, and care plan provided today and DECLINED offer to receive copy of patient instructions, educational materials, and care plan.   No further follow up required: By social work at this time.  RN will continue to follow  Casimer Lanius, [place] [phone]

## 2022-06-28 NOTE — Patient Outreach (Signed)
  Care Coordination  Follow Up Visit Note   06/28/2022 Name: Jessica [name] MRN: [id number] DOB: 04-06-1933  Jessica [name] is a 89 y.o. year old female who sees Plotnikov, Evie Lacks, MD for primary care. I spoke with  [name] by phone today.  What matters to the patients health and wellness today?  Home delivery meals and PT  Patient reports she is feeling much better today than when she came home.  Per patient she spoke with someone and should have post discharge meals delivered tomorrow from [employer]. Home Health order  ( PT, OT & Aide) hav been placed by PCP Patient is on the wait list for [name] in-home aide program and meals on wheels ( call made by LCSW)   Goals Addressed             This Visit's Progress    COMPLETED: [facility] so that she can walk again, and meal delivery       Activities and task to complete in order to accomplish goals.   Your Dr. Has placed an order for [name] , OT as well as an Aide.  Per your request I have contacted [name] to place you on the waitlist for in-home aide services [facility] [phone]  Please keep phone appointment with the [name] she will provide educational informational and assist you with managing your health needs I have placed a referral with [employer] via [name], you understand this is a long wait list, they will reach out to you. Feel free to follow up with them at [phone]        SDOH assessments and interventions completed:  Yes  SDOH Interventions Today    Flowsheet Row Most Recent Value  SDOH Interventions   Food Insecurity Interventions Other (Comment)  [per pt mom's meals will be delivered 06/29/22]      Care Coordination Interventions:  Yes, provided  Interventions Today    Flowsheet Row Most Recent Value  Chronic Disease   Chronic disease during today's visit Hypertension (HTN), Congestive Heart Failure (CHF), Chronic Obstructive Pulmonary Disease  (COPD), Diabetes  General Interventions   General Interventions Discussed/Reviewed General Interventions Reviewed, Communication with, Level of Care  [orders for [facility] ( PT, OT and aide)]  Communication with --  [[name] wait list for in-home aide program]  Level of [name]  Education Interventions   Education Provided Provided Education  [in-home aide program]  Provided Verbal Education On Community Resources       Follow up plan: No further intervention required. LCSW will collaborate with RN Care Manager as needed.  Encounter Outcome:  Pt. Visit Completed {THN Tip this will not be part of the note when signed-REQUIRED REPORT FIELD DO NOT DELETE (Optional):[id number]  Casimer Lanius, [place] [phone]

## 2022-07-03 DIAGNOSIS — Z556 Problems related to health literacy: Secondary | ICD-10-CM | POA: Diagnosis not present

## 2022-07-03 DIAGNOSIS — E876 Hypokalemia: Secondary | ICD-10-CM | POA: Diagnosis not present

## 2022-07-03 DIAGNOSIS — F0284 Dementia in other diseases classified elsewhere, unspecified severity, with anxiety: Secondary | ICD-10-CM | POA: Diagnosis not present

## 2022-07-03 DIAGNOSIS — I358 Other nonrheumatic aortic valve disorders: Secondary | ICD-10-CM | POA: Diagnosis not present

## 2022-07-03 DIAGNOSIS — K219 Gastro-esophageal reflux disease without esophagitis: Secondary | ICD-10-CM | POA: Diagnosis not present

## 2022-07-03 DIAGNOSIS — J449 Chronic obstructive pulmonary disease, unspecified: Secondary | ICD-10-CM | POA: Diagnosis not present

## 2022-07-03 DIAGNOSIS — I110 Hypertensive heart disease with heart failure: Secondary | ICD-10-CM | POA: Diagnosis not present

## 2022-07-03 DIAGNOSIS — F0283 Dementia in other diseases classified elsewhere, unspecified severity, with mood disturbance: Secondary | ICD-10-CM | POA: Diagnosis not present

## 2022-07-03 DIAGNOSIS — Z86711 Personal history of pulmonary embolism: Secondary | ICD-10-CM | POA: Diagnosis not present

## 2022-07-03 DIAGNOSIS — F32A Depression, unspecified: Secondary | ICD-10-CM | POA: Diagnosis not present

## 2022-07-03 DIAGNOSIS — E039 Hypothyroidism, unspecified: Secondary | ICD-10-CM | POA: Diagnosis not present

## 2022-07-03 DIAGNOSIS — T826XXA Infection and inflammatory reaction due to cardiac valve prosthesis, initial encounter: Secondary | ICD-10-CM | POA: Diagnosis not present

## 2022-07-03 DIAGNOSIS — E1140 Type 2 diabetes mellitus with diabetic neuropathy, unspecified: Secondary | ICD-10-CM | POA: Diagnosis not present

## 2022-07-03 DIAGNOSIS — I4892 Unspecified atrial flutter: Secondary | ICD-10-CM | POA: Diagnosis not present

## 2022-07-03 DIAGNOSIS — I480 Paroxysmal atrial fibrillation: Secondary | ICD-10-CM | POA: Diagnosis not present

## 2022-07-03 DIAGNOSIS — A419 Sepsis, unspecified organism: Secondary | ICD-10-CM | POA: Diagnosis not present

## 2022-07-03 DIAGNOSIS — I442 Atrioventricular block, complete: Secondary | ICD-10-CM | POA: Diagnosis not present

## 2022-07-03 DIAGNOSIS — M1612 Unilateral primary osteoarthritis, left hip: Secondary | ICD-10-CM | POA: Diagnosis not present

## 2022-07-03 DIAGNOSIS — Z7984 Long term (current) use of oral hypoglycemic drugs: Secondary | ICD-10-CM | POA: Diagnosis not present

## 2022-07-03 DIAGNOSIS — I5022 Chronic systolic (congestive) heart failure: Secondary | ICD-10-CM | POA: Diagnosis not present

## 2022-07-03 DIAGNOSIS — I5033 Acute on chronic diastolic (congestive) heart failure: Secondary | ICD-10-CM | POA: Diagnosis not present

## 2022-07-03 DIAGNOSIS — Z8616 Personal history of COVID-19: Secondary | ICD-10-CM | POA: Diagnosis not present

## 2022-07-03 DIAGNOSIS — Z7901 Long term (current) use of anticoagulants: Secondary | ICD-10-CM | POA: Diagnosis not present

## 2022-07-03 DIAGNOSIS — F02818 Dementia in other diseases classified elsewhere, unspecified severity, with other behavioral disturbance: Secondary | ICD-10-CM | POA: Diagnosis not present

## 2022-07-04 ENCOUNTER — Telehealth: Payer: Self-pay | Admitting: Internal Medicine

## 2022-07-04 ENCOUNTER — Ambulatory Visit (INDEPENDENT_AMBULATORY_CARE_PROVIDER_SITE_OTHER): Payer: Medicare Other

## 2022-07-04 VITALS — Ht 62.0 in | Wt 217.0 lb

## 2022-07-04 DIAGNOSIS — E876 Hypokalemia: Secondary | ICD-10-CM | POA: Diagnosis not present

## 2022-07-04 DIAGNOSIS — I358 Other nonrheumatic aortic valve disorders: Secondary | ICD-10-CM | POA: Diagnosis not present

## 2022-07-04 DIAGNOSIS — I110 Hypertensive heart disease with heart failure: Secondary | ICD-10-CM | POA: Diagnosis not present

## 2022-07-04 DIAGNOSIS — F32A Depression, unspecified: Secondary | ICD-10-CM | POA: Diagnosis not present

## 2022-07-04 DIAGNOSIS — A419 Sepsis, unspecified organism: Secondary | ICD-10-CM | POA: Diagnosis not present

## 2022-07-04 DIAGNOSIS — K219 Gastro-esophageal reflux disease without esophagitis: Secondary | ICD-10-CM | POA: Diagnosis not present

## 2022-07-04 DIAGNOSIS — I442 Atrioventricular block, complete: Secondary | ICD-10-CM | POA: Diagnosis not present

## 2022-07-04 DIAGNOSIS — I5022 Chronic systolic (congestive) heart failure: Secondary | ICD-10-CM | POA: Diagnosis not present

## 2022-07-04 DIAGNOSIS — F0284 Dementia in other diseases classified elsewhere, unspecified severity, with anxiety: Secondary | ICD-10-CM | POA: Diagnosis not present

## 2022-07-04 DIAGNOSIS — F0283 Dementia in other diseases classified elsewhere, unspecified severity, with mood disturbance: Secondary | ICD-10-CM | POA: Diagnosis not present

## 2022-07-04 DIAGNOSIS — E1140 Type 2 diabetes mellitus with diabetic neuropathy, unspecified: Secondary | ICD-10-CM | POA: Diagnosis not present

## 2022-07-04 DIAGNOSIS — I4892 Unspecified atrial flutter: Secondary | ICD-10-CM | POA: Diagnosis not present

## 2022-07-04 DIAGNOSIS — J449 Chronic obstructive pulmonary disease, unspecified: Secondary | ICD-10-CM | POA: Diagnosis not present

## 2022-07-04 DIAGNOSIS — I480 Paroxysmal atrial fibrillation: Secondary | ICD-10-CM | POA: Diagnosis not present

## 2022-07-04 DIAGNOSIS — Z7901 Long term (current) use of anticoagulants: Secondary | ICD-10-CM | POA: Diagnosis not present

## 2022-07-04 DIAGNOSIS — Z0000 Encounter for general adult medical examination without abnormal findings: Secondary | ICD-10-CM

## 2022-07-04 DIAGNOSIS — E039 Hypothyroidism, unspecified: Secondary | ICD-10-CM | POA: Diagnosis not present

## 2022-07-04 DIAGNOSIS — Z86711 Personal history of pulmonary embolism: Secondary | ICD-10-CM | POA: Diagnosis not present

## 2022-07-04 DIAGNOSIS — Z556 Problems related to health literacy: Secondary | ICD-10-CM | POA: Diagnosis not present

## 2022-07-04 DIAGNOSIS — T826XXA Infection and inflammatory reaction due to cardiac valve prosthesis, initial encounter: Secondary | ICD-10-CM | POA: Diagnosis not present

## 2022-07-04 DIAGNOSIS — Z7984 Long term (current) use of oral hypoglycemic drugs: Secondary | ICD-10-CM | POA: Diagnosis not present

## 2022-07-04 DIAGNOSIS — I5033 Acute on chronic diastolic (congestive) heart failure: Secondary | ICD-10-CM | POA: Diagnosis not present

## 2022-07-04 DIAGNOSIS — Z8616 Personal history of COVID-19: Secondary | ICD-10-CM | POA: Diagnosis not present

## 2022-07-04 DIAGNOSIS — F02818 Dementia in other diseases classified elsewhere, unspecified severity, with other behavioral disturbance: Secondary | ICD-10-CM | POA: Diagnosis not present

## 2022-07-04 DIAGNOSIS — M1612 Unilateral primary osteoarthritis, left hip: Secondary | ICD-10-CM | POA: Diagnosis not present

## 2022-07-04 NOTE — Progress Notes (Addendum)
Subjective:   Jessica [name] is a 89 y.o. female who presents for Medicare Annual (Subsequent) preventive examination.  I connected with  Jessica [name] on 07/04/22 by a audio enabled telemedicine application and verified that I am speaking with the correct person using two identifiers.  Patient Location: Home  Provider Location: Home Office  I discussed the limitations of evaluation and management by telemedicine. The patient expressed understanding and agreed to proceed.  Review of Systems     Cardiac Risk Factors include: advanced age (>12men, >41 women);diabetes mellitus;dyslipidemia;hypertension;sedentary lifestyle     Objective:    Today's Vitals   07/04/22 1152  Weight: 217 lb (98.4 kg)  Height: 5\' 2"  (1.575 m)   Body mass index is 39.69 kg/m.     07/04/2022    4:53 PM 05/17/2022    2:32 PM 05/17/2022   11:00 AM 04/24/2022   10:50 AM 04/05/2022    3:00 PM 07/25/2021    8:00 AM 07/01/2021    3:38 PM  Advanced Directives  Does Patient Have a Medical Advance Directive? Yes Yes No Unable to assess, patient is non-responsive or altered mental status No No No  Type of Advance Directive Living will;Healthcare Power of [place]      Does patient want to make changes to medical advance directive? No - Patient declined        Copy of [place] in Chart? Yes - validated most recent copy scanned in chart (See row information) Yes - validated most recent copy scanned in chart (See row information)       Would patient like information on creating a medical advance directive?     No - Patient declined  No - Patient declined    Current Medications (verified) Outpatient Encounter Medications as of 07/04/2022  Medication Sig   albuterol (VENTOLIN HFA) 108 (90 Base) MCG/ACT inhaler Inhale 2 puffs into the lungs every 6 (six) hours as needed for wheezing or shortness of breath.   allopurinol (ZYLOPRIM) 100  MG tablet TAKE 1 TABLET BY MOUTH DAILY (Patient taking differently: Take 100 mg by mouth daily.)   apixaban (ELIQUIS) 5 MG TABS tablet Take 1 tablet (5 mg total) by mouth 2 (two) times daily.   b complex vitamins tablet Take 1 tablet by mouth daily.   cefadroxil (DURICEF) 500 MG capsule Take 1 capsule (500 mg total) by mouth 2 (two) times daily.   Cholecalciferol 1000 UNITS tablet Take 1,000 Units by mouth daily.   empagliflozin (JARDIANCE) 10 MG TABS tablet Take 1 tablet (10 mg total) by mouth daily.   ezetimibe (ZETIA) 10 MG tablet TAKE 1 TABLET BY MOUTH DAILY   furosemide (LASIX) 40 MG tablet Take 1 tablet (40 mg total) by mouth daily.   levothyroxine (SYNTHROID) 75 MCG tablet TAKE 1 TABLET BY MOUTH DAILY (Patient taking differently: Take by mouth daily before breakfast.)   metFORMIN (GLUCOPHAGE) 500 MG tablet TAKE 1 TABLET BY MOUTH TWICE  DAILY WITH MEALS   metoprolol tartrate (LOPRESSOR) 25 MG tablet Take 1 tablet (25 mg total) by mouth 2 (two) times daily.   Multiple Vitamin (MULTIVITAMIN) tablet Take 1 tablet by mouth daily.   nystatin powder Apply 1 Application topically daily. Apply to under breast topically every day shift for redness/moisture.   repaglinide (PRANDIN) 2 MG tablet TAKE 2 TABLETS BY MOUTH 3  TIMES DAILY BEFORE MEALS (Patient taking differently: Take 2 mg by mouth 3 (three) times  daily before meals.)   rosuvastatin (CRESTOR) 10 MG tablet TAKE 1 TABLET BY MOUTH UP TO 3  TIMES WEEKLY AS TOLERATED (Patient taking differently: Take 10 mg by mouth every Monday, Wednesday, and Friday.)   saccharomyces boulardii (FLORASTOR) 250 MG capsule Take 250 mg by mouth 2 (two) times daily.   sacubitril-valsartan (ENTRESTO) 97-103 MG Take 1 tablet by mouth 2 (two) times daily.   spironolactone (ALDACTONE) 25 MG tablet Take 0.5 tablets (12.5 mg total) by mouth daily.   temazepam (RESTORIL) 7.5 MG capsule Take 7.5 mg by mouth at bedtime.   venlafaxine XR (EFFEXOR-XR) 75 MG 24 hr capsule TAKE 1  CAPSULE BY MOUTH  DAILY WITH BREAKFAST (Patient taking differently: Take 75 mg by mouth daily.)   [DISCONTINUED] sodium chloride flush (NS) 0.9 % SOLN Inject 10 mLs into the vein 3 (three) times daily. Until 02.08.2024   famotidine (PEPCID) 20 MG tablet Take 1 tablet (20 mg total) by mouth daily.   [DISCONTINUED] Heparin Sod, Pork, Lock Flush (HEPARIN LOCK FLUSH IV) Inject 10 mLs into the vein 3 (three) times daily.   No facility-administered encounter medications on file as of 07/04/2022.    Allergies (verified) Coreg [carvedilol], Relafen [nabumetone], Calcium, Codeine, Lipitor [atorvastatin], Pacerone [amiodarone], Vasotec [enalapril], Vioxx [rofecoxib], and Zocor [simvastatin]   History: Past Medical History:  Diagnosis Date   Anxiety    Atrial fibrillation and flutter    detected by PPM interrogation (mostly atrial flutter)   AV block, 2nd degree 10/2012   s/p MDT ADDRL1 pacemaker implantation 10/10/2012 by [name]   Chronic diastolic heart failure    Depression    GERD (gastroesophageal reflux disease)    HH (hiatus hernia)    History of pulmonary embolism 10/2008   Bilateral   Hyperlipidemia    Hypertension    Hypothyroidism    Obesity    Osteoarthritis    Peripheral neuropathy    Right leg   Presence of permanent cardiac pacemaker    Pulmonary nodule    in the lingula. Noted on pre TAVR CT. Will require follow up   Renal insufficiency 2010   S/P TAVR (transcatheter aortic valve replacement)    Shingles 09/17/2014   right lower quadrant   Type II or unspecified type diabetes mellitus without mention of complication, not stated as uncontrolled 2009   Past Surgical History:  Procedure Laterality Date   APPENDECTOMY     AV NODE ABLATION N/A 07/05/2017   Procedure: AV NODE ABLATION;  Surgeon: [name], MD;  Location: [facility];  Service: Cardiovascular;  Laterality: N/A;   CARPAL TUNNEL RELEASE     CATARACT EXTRACTION     CHOLECYSTECTOMY      COLONOSCOPY  2010   COLONOSCOPY WITH PROPOFOL N/A 07/25/2021   Procedure: COLONOSCOPY WITH PROPOFOL;  Surgeon: Mauri Pole, MD;  Location: [facility];  Service: Gastroenterology;  Laterality: N/A;   FOREARM FRACTURE SURGERY  09/17/2008   HERNIA REPAIR     INGUINAL HERNIA REPAIR     PACEMAKER INSERTION  10/10/2012   MDT ADDRL1 implanted for 2nd degree AV block by [name]   PERMANENT PACEMAKER INSERTION N/A 10/10/2012   Procedure: PERMANENT PACEMAKER INSERTION;  Surgeon: [name], MD;  [facility]: [facility];  Service: Cardiovascular;  Laterality: N/A;   RIGHT/LEFT HEART CATH AND CORONARY ANGIOGRAPHY N/A 12/30/2018   Procedure: RIGHT/LEFT HEART CATH AND CORONARY ANGIOGRAPHY;  Surgeon: [name], MD;  Location: [place] [facility];  Service: Cardiovascular;  Laterality: N/A;  ROTATOR CUFF REPAIR     TEE WITHOUT CARDIOVERSION N/A 01/14/2019   Procedure: TRANSESOPHAGEAL ECHOCARDIOGRAM (TEE);  Surgeon: [name], MD;  Location: [place] [facility];  Service: Open Heart Surgery;  Laterality: N/A;   TEE WITHOUT CARDIOVERSION N/A 04/05/2022   Procedure: TRANSESOPHAGEAL ECHOCARDIOGRAM (TEE);  Surgeon: [name], MD;  Location: [place];  Service: Cardiovascular;  Laterality: N/A;   TRANSCATHETER AORTIC VALVE REPLACEMENT, TRANSFEMORAL N/A 01/14/2019   Procedure: TRANSCATHETER AORTIC VALVE REPLACEMENT, TRANSFEMORAL;  Surgeon: [name], MD;  Location: [place] [facility];  Service: Open Heart Surgery;  Laterality: N/A;   Family History  Problem Relation Age of Onset   Stroke Brother 31   Coronary artery disease Mother    Heart disease Mother 50   Stroke Father 25   Multiple myeloma Brother    Heart attack Son 4       Died of MI   Heart attack Daughter 81       Died of MI   Aneurysm Daughter 25       Question cerebral   Heart disease Maternal Grandmother    Heart disease Maternal Grandfather    Peripheral vascular disease Daughter         Amputation secondary to DM   Social History   Socioeconomic History   Marital status: Widowed    Spouse name: Not on file   Number of children: 3   Years of education: Not on file   Highest education level: Not on file  Occupational History   Not on file  Tobacco Use   Smoking status: Never   Smokeless tobacco: Never  Vaping Use   Vaping Use: Never used  Substance and Sexual Activity   Alcohol use: No    Alcohol/week: 0.0 standard drinks of alcohol   Drug use: No   Sexual activity: Not Currently  Other Topics Concern   Not on file  Social History Narrative   Opth - Dr Leonie Man   Retired, Looking after great-grand baby; lives w/son   Daily Caffeine Use - 1   Widow - suicide 2008-09-08; 2 daughters died      lost brother and son 07/24/2012; spouse died 2008/07/24   Have 4 children; 3 have expired,  now has one; dtr; copes by reading    Father had stroke at 50 and mother had heart disease; MI at 73  but lived to be 26.    Son died quickly of lung cancer   [place]; the oldest had aneurysm   Youngest dtr 44 and died of MI   Twin sister dx with Alzheimer's today/    Will give information regarding Alz resources   Social Determinants of Health   Financial Resource Strain: Low Risk  (07/04/2022)   Overall Financial Resource Strain (CARDIA)    Difficulty of Paying Living Expenses: Not hard at all  Food Insecurity: No Food Insecurity (07/04/2022)   Hunger Vital Sign    Worried About Running Out of Food in the Last Year: Never true    Ran Out of Food in the Last Year: Never true  Transportation Needs: No Transportation Needs (07/04/2022)   PRAPARE - Hydrologist (Medical): No    Lack of Transportation (Non-Medical): No  Physical Activity: Inactive (07/04/2022)   Exercise Vital Sign    Days of Exercise per Week: 0 days    Minutes of Exercise per Session: 0 min  Stress: No Stress Concern Present (07/04/2022)   [place]  [facility]    Feeling of Stress : Not at all  Social Connections: Socially Isolated (07/04/2022)   Social Connection and Isolation Panel [NHANES]    Frequency of Communication with Friends and Family: Three times a week    Frequency of Social Gatherings with Friends and Family: Three times a week    Attends Religious Services: Never    Active Member of Clubs or Organizations: No    Attends Archivist Meetings: Never    Marital Status: Widowed    Tobacco Counseling Counseling given: Not Answered   Clinical Intake:  Pre-visit preparation completed: Yes  Pain : No/denies pain  Diabetes: Yes CBG done?: No Did pt. bring in CBG monitor from home?: No  How often do you need to have someone help you when you read instructions, pamphlets, or other written materials from your doctor or pharmacy?: 1 - Never  Diabetic?Yes   Nutrition Risk Assessment:  Has the patient had any N/V/D within the last 2 months?  No  Does the patient have any non-healing wounds?  No  Has the patient had any unintentional weight loss or weight gain?  No   Diabetes:  Is the patient diabetic?  Yes  If diabetic, was a CBG obtained today?  No  Did the patient bring in their glucometer from home?  No  How often do you monitor your CBG's? daily.   Financial Strains and Diabetes Management:  Are you having any financial strains with the device, your supplies or your medication? No .  Does the patient want to be seen by Chronic Care Management for management of their diabetes?  No  Would the patient like to be referred to a Nutritionist or for Diabetic Management?  No   Diabetic Exams:  Diabetic Eye Exam: Overdue for diabetic eye exam. Pt has been advised about the importance in completing this exam. Patient advised to call and schedule an eye exam. Diabetic Foot Exam: Overdue, Pt has been advised about the importance in completing this exam. Pt is scheduled for diabetic foot exam on at  next office visit .   Interpreter Needed?: No  Information entered by :: Denman George LPN   Activities of Daily Living    07/04/2022    4:53 PM 04/05/2022    3:00 PM  In your present state of health, do you have any difficulty performing the following activities:  Hearing? 1   Vision? 1   Difficulty concentrating or making decisions? 0   Walking or climbing stairs? 1   Dressing or bathing? 1   Doing errands, shopping? 1 0  Preparing Food and eating ? Y   Using the Toilet? N   In the past six months, have you accidently leaked urine? N   Do you have problems with loss of bowel control? N   Managing your Medications? Y   Managing your Finances? Y   Housekeeping or managing your Housekeeping? Y     Patient Care Team: Plotnikov, Evie Lacks, MD as PCP - General (Internal Medicine) Skeet Latch, MD as PCP - Cardiology (Cardiology) Vickie Epley, MD as PCP - Electrophysiology (Cardiology) Skeet Latch, MD as Attending Physician (Cardiology) [facility] [place] [facility] Luretha Rued, RN as [facility] Vu, Rockey Situ, MD as Consulting Physician (Infectious Diseases)  Indicate any recent Medical Services you may have received from other than [facility] providers in the past year (date may be approximate).  Assessment:   This is a routine wellness examination for [place].  Hearing/Vision screen Hearing Screening - Comments:: Denies hearing difficulties   Vision Screening - Comments:: Wears rx glasses - denies vision problems   Dietary issues and exercise activities discussed: Current Exercise Habits: The patient does not participate in regular exercise at present   Goals Addressed             This Visit's Progress    Prevent falls        Depression Screen    07/04/2022    4:51 PM 06/20/2022    1:44 PM 07/01/2021    3:39 PM 07/01/2021    3:37 PM 06/22/2021   11:28 AM 09/07/2020   11:47 AM 07/03/2019   12:18 PM   PHQ 2/9 Scores  PHQ - 2 Score 0 1 0 0 0 2 1  PHQ- 9 Score      5     Fall Risk    07/04/2022    4:49 PM 06/26/2022    2:44 PM 06/20/2022    1:44 PM 07/01/2021    3:39 PM 06/22/2021   11:28 AM  Fall Risk   Falls in the past year? 1 1 0 0 1  Number falls in past yr: 1 1 0 0 0  Injury with Fall? 1 1 0 0 0  Risk for fall due to : History of fall(s);Impaired balance/gait;Impaired mobility Impaired balance/gait;Impaired mobility;History of fall(s) No Fall Risks    Follow up Falls prevention discussed;Falls evaluation completed;Education provided Falls evaluation completed Falls evaluation completed Falls evaluation completed     FALL RISK PREVENTION PERTAINING TO THE HOME:  Any stairs in or around the home? No  If so, are there any without handrails? No  Home free of loose throw rugs in walkways, pet beds, electrical cords, etc? Yes  Adequate lighting in your home to reduce risk of falls? Yes   ASSISTIVE DEVICES UTILIZED TO PREVENT FALLS:  Life alert? No  Use of a cane, walker or w/c? Yes  Grab bars in the bathroom? Yes  Shower chair or bench in shower? No  Elevated toilet seat or a handicapped toilet? Yes   TIMED UP AND GO:  Was the test performed? No . Telephonic visit   Cognitive Function:    12/21/2017    1:28 PM 12/20/2016    5:23 PM 10/19/2014    1:51 PM  MMSE - Mini Mental State Exam  Not completed:   Unable to complete  Orientation to time 5 5   Orientation to Place 5 5   Registration 3 3   Attention/ Calculation 5 5   Recall 1 2   Language- name 2 objects 2 2   Language- repeat 1 1   Language- follow 3 step command 3 3   Language- read & follow direction 1 1   Write a sentence 1 1   Copy design 1 1   Total score 28 29         07/04/2022    4:54 PM  6CIT Screen  What Year? 0 points  What month? 0 points  What time? 0 points  Count back from 20 0 points  Months in reverse 0 points  Repeat phrase 2 points  Total Score 2 points     Immunizations Immunization History  Administered Date(s) Administered   Fluad Quad(high Dose 65+) 12/04/2018, 03/10/2020, 12/21/2020, 12/26/2021   Influenza Split 12/29/2010, 12/28/2011   Influenza Whole 02/05/2007, 01/20/2008, 02/01/2009, 12/07/2009   Influenza, High  Dose Seasonal PF 02/11/2013, 12/16/2015, 12/20/2016, 12/21/2017   Influenza,inj,Quad PF,6+ Mos 03/06/2014, 12/24/2014   PFIZER(Purple Top)SARS-COV-2 Vaccination 06/01/2019, 07/01/2019   PNEUMOCOCCAL CONJUGATE-20 09/07/2020   Pneumococcal Conjugate-13 06/02/2009   Pneumococcal Polysaccharide-23 04/04/2004, 06/02/2009   Td 04/04/2004, 10/14/2014    TDAP status: Up to date  Flu Vaccine status: Up to date  Pneumococcal vaccine status: Up to date  Covid-19 vaccine status: Information provided on how to obtain vaccines.   Qualifies for Shingles Vaccine? Yes   Zostavax completed No   Shingrix Completed?: No.    Education has been provided regarding the importance of this vaccine. Patient has been advised to call insurance company to determine out of pocket expense if they have not yet received this vaccine. Advised may also receive vaccine at local pharmacy or Health Dept. Verbalized acceptance and understanding.  Screening Tests Health Maintenance  Topic Date Due   OPHTHALMOLOGY EXAM  Never done   FOOT EXAM  12/22/2018   COVID-19 Vaccine (3 - 2023-24 season) 12/02/2021   INFLUENZA VACCINE  11/02/2022   Medicare Annual Wellness (AWV)  07/04/2023   DTaP/Tdap/Td (3 - Tdap) 10/13/2024   Pneumonia Vaccine 57+ Years old  Completed   DEXA SCAN  Completed   HPV VACCINES  Aged Out   Zoster Vaccines- Shingrix  Discontinued    Health Maintenance  Health Maintenance Due  Topic Date Due   OPHTHALMOLOGY EXAM  Never done   FOOT EXAM  12/22/2018   COVID-19 Vaccine (3 - 2023-24 season) 12/02/2021    Colorectal cancer screening: No longer required.   Mammogram status: No longer required due to age.  Bone Density  status: Completed 01/04/18. Results reflect: Bone density results: NORMAL. Repeat every 5 years.  Lung Cancer Screening: (Low Dose CT Chest recommended if Age 3-80 years, 30 pack-year currently smoking OR have quit w/in 15years.) does not qualify.   Lung Cancer Screening Referral: n/a  Additional Screening:  Hepatitis C Screening: does not qualify  Vision Screening: Recommended annual ophthalmology exams for early detection of glaucoma and other disorders of the eye. Is the patient up to date with their annual eye exam?  No  Who is the provider or what is the name of the office in which the patient attends annual eye exams? none If pt is not established with a provider, would they like to be referred to a provider to establish care? No .   Dental Screening: Recommended annual dental exams for proper oral hygiene  Community Resource Referral / Chronic Care Management: CRR required this visit?  No   CCM required this visit?  No      Plan:     I have personally reviewed and noted the following in the patient's chart:   Medical and social history Use of alcohol, tobacco or illicit drugs  Current medications and supplements including opioid prescriptions. Patient is not currently taking opioid prescriptions. Functional ability and status Nutritional status Physical activity Advanced directives List of other physicians Hospitalizations, surgeries, and ER visits in previous 12 months Vitals Screenings to include cognitive, depression, and falls Referrals and appointments  In addition, I have reviewed and discussed with patient certain preventive protocols, quality metrics, and best practice recommendations. A written personalized care plan for preventive services as well as general preventive health recommendations were provided to patient.     Vanetta Mulders, [place]   [id number]   Due to this being a virtual visit, the after visit summary with patients personalized  plan was offered to patient  via mail or my-chart. per request, patient was mailed a copy of AVS  Nurse Notes: No concerns      Medical screening examination/treatment/procedure(s) were performed by non-physician practitioner and as supervising physician I was immediately available for consultation/collaboration.  I agree with above. Lew Dawes, MD

## 2022-07-04 NOTE — Telephone Encounter (Signed)
Jessica [name] with [facility] calls today in regards to new physical therapy orders they were needing placed. They are requesting the frequency of these orders be for  Twice a week for four weeks Once a week for four weeks  CB: [phone]  PT does have a secure VM

## 2022-07-04 NOTE — Patient Instructions (Signed)
Jessica [name] , Thank you for taking time to come for your Medicare Wellness Visit. I appreciate your ongoing commitment to your health goals. Please review the following plan we discussed and let me know if I can assist you in the future.   These are the goals we discussed:  Goals      Care Coordination-assist with health management     Interventions Today    Flowsheet Row Most Recent Value  Chronic Disease   Chronic disease during today's visit Diabetes, Congestive Heart Failure (CHF), Other  [recent discharge from Skilled facility]  General Interventions   General Interventions Discussed/Reviewed General Interventions Discussed, Doctor Visits  Doctor Visits Discussed/Reviewed Doctor Visits Discussed  PCP/Specialist Visits Compliance with follow-up visit  [facility] called to schedule cardiology visit for patient: 07/14/22 at 10:30 am at the [place] location. patient called-message left]  Exercise Interventions   Exercise Discussed/Reviewed Assistive device use and maintanence  Education Interventions   Education Provided Provided Education  Provided Verbal Education On Blood Sugar Monitoring, Medication, Nutrition  [reiterated moms meals arrange per [place] Discussed/Reviewed Other  [patient reports it is hard when others in family have passed on. encouraged to also discuss feelings with LCSW at call next week.]  Nutrition Interventions   Nutrition Discussed/Reviewed Decreasing salt  [encouraged to montior salt intake]  Pharmacy Interventions   Pharmacy Dicussed/Reviewed Pharmacy Topics Discussed, Medications and their functions, Medication Adherence, Affording Medications  Safety Interventions   Safety Discussed/Reviewed Safety Discussed  [reiterated change position slowly, encouraged to follow up with provider on Monday regarding [facility] therapy]           Prevent falls        This is a list of the screening recommended for you and due dates:   Health Maintenance  Topic Date Due   Eye exam for diabetics  Never done   Complete foot exam   12/22/2018   COVID-19 Vaccine (3 - 2023-24 season) 12/02/2021   Flu Shot  11/02/2022   Medicare Annual Wellness Visit  07/04/2023   DTaP/Tdap/Td vaccine (3 - Tdap) 10/13/2024   Pneumonia Vaccine  Completed   DEXA scan (bone density measurement)  Completed   HPV Vaccine  Aged Out   Zoster (Shingles) Vaccine  Discontinued    Advanced directives: We have a copy of your advanced directives available in your record should your provider ever need to access them.  Conditions/risks identified: Aim for 30 minutes of exercise or brisk walking, 6-8 glasses of water, and 5 servings of fruits and vegetables each day.  Next appointment: Follow up in one year for your annual wellness visit    Preventive Care 65 Years and Older, Female Preventive care refers to lifestyle choices and visits with your health care provider that can promote health and wellness. What does preventive care include? A yearly physical exam. This is also called an annual well check. Dental exams once or twice a year. Routine eye exams. Ask your health care provider how often you should have your eyes checked. Personal lifestyle choices, including: Daily care of your teeth and gums. Regular physical activity. Eating a healthy diet. Avoiding tobacco and drug use. Limiting alcohol use. Practicing safe sex. Taking low-dose aspirin every day. Taking vitamin and mineral supplements as recommended by your health care provider. What happens during an annual well check? The services and screenings done by your health care provider during your annual well check will depend on your age, overall health,  lifestyle risk factors, and family history of disease. Counseling  Your health care provider may ask you questions about your: Alcohol use. Tobacco use. Drug use. Emotional well-being. Home and relationship well-being. Sexual  activity. Eating habits. History of falls. Memory and ability to understand (cognition). Work and work Statistician. Reproductive health. Screening  You may have the following tests or measurements: Height, weight, and BMI. Blood pressure. Lipid and cholesterol levels. These may be checked every 5 years, or more frequently if you are over 44 years old. Skin check. Lung cancer screening. You may have this screening every year starting at age 63 if you have a 30-pack-year history of smoking and currently smoke or have quit within the past 15 years. Fecal occult blood test (FOBT) of the stool. You may have this test every year starting at age 4. Flexible sigmoidoscopy or colonoscopy. You may have a sigmoidoscopy every 5 years or a colonoscopy every 10 years starting at age 29. Hepatitis C blood test. Hepatitis B blood test. Sexually transmitted disease (STD) testing. Diabetes screening. This is done by checking your blood sugar (glucose) after you have not eaten for a while (fasting). You may have this done every 1-3 years. Bone density scan. This is done to screen for osteoporosis. You may have this done starting at age 70. Mammogram. This may be done every 1-2 years. Talk to your health care provider about how often you should have regular mammograms. Talk with your health care provider about your test results, treatment options, and if necessary, the need for more tests. Vaccines  Your health care provider may recommend certain vaccines, such as: Influenza vaccine. This is recommended every year. Tetanus, diphtheria, and acellular pertussis (Tdap, Td) vaccine. You may need a Td booster every 10 years. Zoster vaccine. You may need this after age 46. Pneumococcal 13-valent conjugate (PCV13) vaccine. One dose is recommended after age 41. Pneumococcal polysaccharide (PPSV23) vaccine. One dose is recommended after age 68. Talk to your health care provider about which screenings and vaccines  you need and how often you need them. This information is not intended to replace advice given to you by your health care provider. Make sure you discuss any questions you have with your health care provider. Document Released: 04/16/2015 Document Revised: 12/08/2015 Document Reviewed: 01/19/2015 Elsevier Interactive Patient Education  2017 [employer] Prevention in the Home Falls can cause injuries. They can happen to people of all ages. There are many things you can do to make your home safe and to help prevent falls. What can I do on the outside of my home? Regularly fix the edges of walkways and driveways and fix any cracks. Remove anything that might make you trip as you walk through a door, such as a raised step or threshold. Trim any bushes or trees on the path to your home. Use bright outdoor lighting. Clear any walking paths of anything that might make someone trip, such as rocks or tools. Regularly check to see if handrails are loose or broken. Make sure that both sides of any steps have handrails. Any raised decks and porches should have guardrails on the edges. Have any leaves, snow, or ice cleared regularly. Use sand or salt on walking paths during winter. Clean up any spills in your garage right away. This includes oil or grease spills. What can I do in the bathroom? Use night lights. Install grab bars by the toilet and in the tub and shower. Do not use towel bars as  grab bars. Use non-skid mats or decals in the tub or shower. If you need to sit down in the shower, use a plastic, non-slip stool. Keep the floor dry. Clean up any water that spills on the floor as soon as it happens. Remove soap buildup in the tub or shower regularly. Attach bath mats securely with double-sided non-slip rug tape. Do not have throw rugs and other things on the floor that can make you trip. What can I do in the bedroom? Use night lights. Make sure that you have a light by your bed that  is easy to reach. Do not use any sheets or blankets that are too big for your bed. They should not hang down onto the floor. Have a firm chair that has side arms. You can use this for support while you get dressed. Do not have throw rugs and other things on the floor that can make you trip. What can I do in the kitchen? Clean up any spills right away. Avoid walking on wet floors. Keep items that you use a lot in easy-to-reach places. If you need to reach something above you, use a strong step stool that has a grab bar. Keep electrical cords out of the way. Do not use floor polish or wax that makes floors slippery. If you must use wax, use non-skid floor wax. Do not have throw rugs and other things on the floor that can make you trip. What can I do with my stairs? Do not leave any items on the stairs. Make sure that there are handrails on both sides of the stairs and use them. Fix handrails that are broken or loose. Make sure that handrails are as long as the stairways. Check any carpeting to make sure that it is firmly attached to the stairs. Fix any carpet that is loose or worn. Avoid having throw rugs at the top or bottom of the stairs. If you do have throw rugs, attach them to the floor with carpet tape. Make sure that you have a light switch at the top of the stairs and the bottom of the stairs. If you do not have them, ask someone to add them for you. What else can I do to help prevent falls? Wear shoes that: Do not have high heels. Have rubber bottoms. Are comfortable and fit you well. Are closed at the toe. Do not wear sandals. If you use a stepladder: Make sure that it is fully opened. Do not climb a closed stepladder. Make sure that both sides of the stepladder are locked into place. Ask someone to hold it for you, if possible. Clearly mark and make sure that you can see: Any grab bars or handrails. First and last steps. Where the edge of each step is. Use tools that help you  move around (mobility aids) if they are needed. These include: Canes. Walkers. Scooters. Crutches. Turn on the lights when you go into a dark area. Replace any light bulbs as soon as they burn out. Set up your furniture so you have a clear path. Avoid moving your furniture around. If any of your floors are uneven, fix them. If there are any pets around you, be aware of where they are. Review your medicines with your doctor. Some medicines can make you feel dizzy. This can increase your chance of falling. Ask your doctor what other things that you can do to help prevent falls. This information is not intended to replace advice given to you by  your health care provider. Make sure you discuss any questions you have with your health care provider. Document Released: 01/14/2009 Document Revised: 08/26/2015 Document Reviewed: 04/24/2014 Elsevier Interactive Patient Education  2017 [employer].

## 2022-07-05 ENCOUNTER — Ambulatory Visit: Payer: Self-pay

## 2022-07-05 DIAGNOSIS — K219 Gastro-esophageal reflux disease without esophagitis: Secondary | ICD-10-CM | POA: Diagnosis not present

## 2022-07-05 DIAGNOSIS — F02818 Dementia in other diseases classified elsewhere, unspecified severity, with other behavioral disturbance: Secondary | ICD-10-CM | POA: Diagnosis not present

## 2022-07-05 DIAGNOSIS — Z7984 Long term (current) use of oral hypoglycemic drugs: Secondary | ICD-10-CM | POA: Diagnosis not present

## 2022-07-05 DIAGNOSIS — E039 Hypothyroidism, unspecified: Secondary | ICD-10-CM | POA: Diagnosis not present

## 2022-07-05 DIAGNOSIS — I5022 Chronic systolic (congestive) heart failure: Secondary | ICD-10-CM | POA: Diagnosis not present

## 2022-07-05 DIAGNOSIS — I110 Hypertensive heart disease with heart failure: Secondary | ICD-10-CM | POA: Diagnosis not present

## 2022-07-05 DIAGNOSIS — Z8616 Personal history of COVID-19: Secondary | ICD-10-CM | POA: Diagnosis not present

## 2022-07-05 DIAGNOSIS — M1612 Unilateral primary osteoarthritis, left hip: Secondary | ICD-10-CM | POA: Diagnosis not present

## 2022-07-05 DIAGNOSIS — Z556 Problems related to health literacy: Secondary | ICD-10-CM | POA: Diagnosis not present

## 2022-07-05 DIAGNOSIS — J449 Chronic obstructive pulmonary disease, unspecified: Secondary | ICD-10-CM | POA: Diagnosis not present

## 2022-07-05 DIAGNOSIS — F32A Depression, unspecified: Secondary | ICD-10-CM | POA: Diagnosis not present

## 2022-07-05 DIAGNOSIS — F0284 Dementia in other diseases classified elsewhere, unspecified severity, with anxiety: Secondary | ICD-10-CM | POA: Diagnosis not present

## 2022-07-05 DIAGNOSIS — I480 Paroxysmal atrial fibrillation: Secondary | ICD-10-CM | POA: Diagnosis not present

## 2022-07-05 DIAGNOSIS — I358 Other nonrheumatic aortic valve disorders: Secondary | ICD-10-CM | POA: Diagnosis not present

## 2022-07-05 DIAGNOSIS — E1140 Type 2 diabetes mellitus with diabetic neuropathy, unspecified: Secondary | ICD-10-CM | POA: Diagnosis not present

## 2022-07-05 DIAGNOSIS — E876 Hypokalemia: Secondary | ICD-10-CM | POA: Diagnosis not present

## 2022-07-05 DIAGNOSIS — I442 Atrioventricular block, complete: Secondary | ICD-10-CM | POA: Diagnosis not present

## 2022-07-05 DIAGNOSIS — Z7901 Long term (current) use of anticoagulants: Secondary | ICD-10-CM | POA: Diagnosis not present

## 2022-07-05 DIAGNOSIS — Z86711 Personal history of pulmonary embolism: Secondary | ICD-10-CM | POA: Diagnosis not present

## 2022-07-05 DIAGNOSIS — I4892 Unspecified atrial flutter: Secondary | ICD-10-CM | POA: Diagnosis not present

## 2022-07-05 DIAGNOSIS — T826XXA Infection and inflammatory reaction due to cardiac valve prosthesis, initial encounter: Secondary | ICD-10-CM | POA: Diagnosis not present

## 2022-07-05 DIAGNOSIS — A419 Sepsis, unspecified organism: Secondary | ICD-10-CM | POA: Diagnosis not present

## 2022-07-05 DIAGNOSIS — F0283 Dementia in other diseases classified elsewhere, unspecified severity, with mood disturbance: Secondary | ICD-10-CM | POA: Diagnosis not present

## 2022-07-05 DIAGNOSIS — I5033 Acute on chronic diastolic (congestive) heart failure: Secondary | ICD-10-CM | POA: Diagnosis not present

## 2022-07-05 NOTE — Patient Outreach (Signed)
  Care Coordination   Follow Up Visit Note   07/05/2022 Name: Jessica [name] MRN: [id number] DOB: Apr 16, 1933  Jessica [name] is a 89 y.o. year old female who sees Jessica [name], Jessica Lacks, MD for primary care. I spoke with  Jessica [name] by phone today.  What matters to the patients health and wellness today?  Jessica [name] reports she continues with home health therapy. She reports near fall. She states she was trying to do laundry and did not have walker with her and wheelchair was a little farther than she expected. She denies fall adding she felt her legs get weak. She also expresses the fact that she cannot remember remember the Christmas holiday when a family member (who passed following the holiday) came to see her.  Goals Addressed             This Visit's Progress    Care Coordination-assist with health management       Interventions Today    Flowsheet Row Most Recent Value  Chronic Disease   Chronic disease during today's visit Congestive Heart Failure (CHF)  General Interventions   General Interventions Discussed/Reviewed General Interventions Reviewed, Doctor Visits  Doctor Visits Discussed/Reviewed Doctor Visits Discussed  PCP/Specialist Visits Compliance with follow-up visit  [upcoming provider appointments reviewed with patient]  Mental Health Interventions   Mental Health Discussed/Reviewed Other  [offered social work for talk therapy regarding patient concern with loss of memory of family visit during the holiday at the time of her illness/hospitalization- patient declines]  Pharmacy Interventions   Pharmacy Dicussed/Reviewed Pharmacy Topics Discussed  [patient denies any difficulty with medication management]  Safety Interventions   Safety Discussed/Reviewed Fall Risk, Safety Discussed  [advised to perform exercises provided by therapist, encouraged to discuss goal with therapist such as doing laundry. fall prevention strateties discussed. advised to have  assistive devices/wheelchair at all times.]            SDOH assessments and interventions completed:  No  Care Coordination Interventions:  Yes, provided   Follow up plan: Follow up call scheduled for 07/19/22    Encounter Outcome:  Pt. Visit Completed   Jessica Silversmith, RN, MSN, BSN, [place] Coordinator [phone]

## 2022-07-05 NOTE — Patient Instructions (Addendum)
Visit Information  Thank you for taking time to visit with me today. Please don't hesitate to contact me if I can be of assistance to you.   Following are the goals we discussed today:   Goals Addressed             This Visit's Progress    Care Coordination-assist with health management       Interventions Today    Flowsheet Row Most Recent Value  Chronic Disease   Chronic disease during today's visit Congestive Heart Failure (CHF)  General Interventions   General Interventions Discussed/Reviewed General Interventions Reviewed, Doctor Visits  Doctor Visits Discussed/Reviewed Doctor Visits Discussed  PCP/Specialist Visits Compliance with follow-up visit  [upcoming provider appointments reviewed with patient]  Mental Health Interventions   Mental Health Discussed/Reviewed Other  [offered social work for talk therapy regarding patient concern with loss of memory of family visit during the holiday at the time of her illness/hospitalization- patient declines]  Pharmacy Interventions   Pharmacy Dicussed/Reviewed Pharmacy Topics Discussed  [patient denies any difficulty with medication management]  Safety Interventions   Safety Discussed/Reviewed Fall Risk, Safety Discussed  [advised to perform exercises provided by therapist, encouraged to discuss goal with therapist such as doing laundry. fall prevention strateties discussed. advised to have assistive devices/wheelchair at all times.]            Our next appointment is by telephone on 07/19/22 at 9:45 am  Please call the care guide team at [phone] if you need to cancel or reschedule your appointment.   If you are experiencing a Mental Health or [place] or need someone to talk to, please call the Suicide and Crisis Lifeline: [place], RN, MSN, BSN, [place] [phone]

## 2022-07-05 NOTE — Telephone Encounter (Signed)
Okay. Thank you.

## 2022-07-05 NOTE — Telephone Encounter (Signed)
Notified Gaspar Bidding w/ MD response.Marland KitchenJohny Chess

## 2022-07-06 DIAGNOSIS — Z7901 Long term (current) use of anticoagulants: Secondary | ICD-10-CM | POA: Diagnosis not present

## 2022-07-06 DIAGNOSIS — I5022 Chronic systolic (congestive) heart failure: Secondary | ICD-10-CM | POA: Diagnosis not present

## 2022-07-06 DIAGNOSIS — I358 Other nonrheumatic aortic valve disorders: Secondary | ICD-10-CM | POA: Diagnosis not present

## 2022-07-06 DIAGNOSIS — I5033 Acute on chronic diastolic (congestive) heart failure: Secondary | ICD-10-CM | POA: Diagnosis not present

## 2022-07-06 DIAGNOSIS — F32A Depression, unspecified: Secondary | ICD-10-CM | POA: Diagnosis not present

## 2022-07-06 DIAGNOSIS — M1612 Unilateral primary osteoarthritis, left hip: Secondary | ICD-10-CM | POA: Diagnosis not present

## 2022-07-06 DIAGNOSIS — Z86711 Personal history of pulmonary embolism: Secondary | ICD-10-CM | POA: Diagnosis not present

## 2022-07-06 DIAGNOSIS — E039 Hypothyroidism, unspecified: Secondary | ICD-10-CM | POA: Diagnosis not present

## 2022-07-06 DIAGNOSIS — E876 Hypokalemia: Secondary | ICD-10-CM | POA: Diagnosis not present

## 2022-07-06 DIAGNOSIS — Z7984 Long term (current) use of oral hypoglycemic drugs: Secondary | ICD-10-CM | POA: Diagnosis not present

## 2022-07-06 DIAGNOSIS — Z556 Problems related to health literacy: Secondary | ICD-10-CM | POA: Diagnosis not present

## 2022-07-06 DIAGNOSIS — I442 Atrioventricular block, complete: Secondary | ICD-10-CM | POA: Diagnosis not present

## 2022-07-06 DIAGNOSIS — K219 Gastro-esophageal reflux disease without esophagitis: Secondary | ICD-10-CM | POA: Diagnosis not present

## 2022-07-06 DIAGNOSIS — T826XXA Infection and inflammatory reaction due to cardiac valve prosthesis, initial encounter: Secondary | ICD-10-CM | POA: Diagnosis not present

## 2022-07-06 DIAGNOSIS — F0283 Dementia in other diseases classified elsewhere, unspecified severity, with mood disturbance: Secondary | ICD-10-CM | POA: Diagnosis not present

## 2022-07-06 DIAGNOSIS — E1140 Type 2 diabetes mellitus with diabetic neuropathy, unspecified: Secondary | ICD-10-CM | POA: Diagnosis not present

## 2022-07-06 DIAGNOSIS — I4892 Unspecified atrial flutter: Secondary | ICD-10-CM | POA: Diagnosis not present

## 2022-07-06 DIAGNOSIS — Z8616 Personal history of COVID-19: Secondary | ICD-10-CM | POA: Diagnosis not present

## 2022-07-06 DIAGNOSIS — A419 Sepsis, unspecified organism: Secondary | ICD-10-CM | POA: Diagnosis not present

## 2022-07-06 DIAGNOSIS — I110 Hypertensive heart disease with heart failure: Secondary | ICD-10-CM | POA: Diagnosis not present

## 2022-07-06 DIAGNOSIS — J449 Chronic obstructive pulmonary disease, unspecified: Secondary | ICD-10-CM | POA: Diagnosis not present

## 2022-07-06 DIAGNOSIS — F02818 Dementia in other diseases classified elsewhere, unspecified severity, with other behavioral disturbance: Secondary | ICD-10-CM | POA: Diagnosis not present

## 2022-07-06 DIAGNOSIS — I480 Paroxysmal atrial fibrillation: Secondary | ICD-10-CM | POA: Diagnosis not present

## 2022-07-06 DIAGNOSIS — F0284 Dementia in other diseases classified elsewhere, unspecified severity, with anxiety: Secondary | ICD-10-CM | POA: Diagnosis not present

## 2022-07-10 DIAGNOSIS — I358 Other nonrheumatic aortic valve disorders: Secondary | ICD-10-CM | POA: Diagnosis not present

## 2022-07-10 DIAGNOSIS — I110 Hypertensive heart disease with heart failure: Secondary | ICD-10-CM | POA: Diagnosis not present

## 2022-07-10 DIAGNOSIS — I442 Atrioventricular block, complete: Secondary | ICD-10-CM | POA: Diagnosis not present

## 2022-07-10 DIAGNOSIS — M1612 Unilateral primary osteoarthritis, left hip: Secondary | ICD-10-CM | POA: Diagnosis not present

## 2022-07-10 DIAGNOSIS — I4892 Unspecified atrial flutter: Secondary | ICD-10-CM | POA: Diagnosis not present

## 2022-07-10 DIAGNOSIS — I480 Paroxysmal atrial fibrillation: Secondary | ICD-10-CM | POA: Diagnosis not present

## 2022-07-10 DIAGNOSIS — T826XXA Infection and inflammatory reaction due to cardiac valve prosthesis, initial encounter: Secondary | ICD-10-CM | POA: Diagnosis not present

## 2022-07-10 DIAGNOSIS — I5033 Acute on chronic diastolic (congestive) heart failure: Secondary | ICD-10-CM | POA: Diagnosis not present

## 2022-07-10 DIAGNOSIS — J449 Chronic obstructive pulmonary disease, unspecified: Secondary | ICD-10-CM | POA: Diagnosis not present

## 2022-07-10 DIAGNOSIS — E1140 Type 2 diabetes mellitus with diabetic neuropathy, unspecified: Secondary | ICD-10-CM | POA: Diagnosis not present

## 2022-07-10 DIAGNOSIS — Z7901 Long term (current) use of anticoagulants: Secondary | ICD-10-CM | POA: Diagnosis not present

## 2022-07-10 DIAGNOSIS — Z7984 Long term (current) use of oral hypoglycemic drugs: Secondary | ICD-10-CM | POA: Diagnosis not present

## 2022-07-10 DIAGNOSIS — A419 Sepsis, unspecified organism: Secondary | ICD-10-CM | POA: Diagnosis not present

## 2022-07-10 DIAGNOSIS — F0283 Dementia in other diseases classified elsewhere, unspecified severity, with mood disturbance: Secondary | ICD-10-CM | POA: Diagnosis not present

## 2022-07-10 DIAGNOSIS — Z8616 Personal history of COVID-19: Secondary | ICD-10-CM | POA: Diagnosis not present

## 2022-07-10 DIAGNOSIS — F0284 Dementia in other diseases classified elsewhere, unspecified severity, with anxiety: Secondary | ICD-10-CM | POA: Diagnosis not present

## 2022-07-10 DIAGNOSIS — E039 Hypothyroidism, unspecified: Secondary | ICD-10-CM | POA: Diagnosis not present

## 2022-07-10 DIAGNOSIS — Z556 Problems related to health literacy: Secondary | ICD-10-CM | POA: Diagnosis not present

## 2022-07-10 DIAGNOSIS — E876 Hypokalemia: Secondary | ICD-10-CM | POA: Diagnosis not present

## 2022-07-10 DIAGNOSIS — Z86711 Personal history of pulmonary embolism: Secondary | ICD-10-CM | POA: Diagnosis not present

## 2022-07-10 DIAGNOSIS — F32A Depression, unspecified: Secondary | ICD-10-CM | POA: Diagnosis not present

## 2022-07-10 DIAGNOSIS — F02818 Dementia in other diseases classified elsewhere, unspecified severity, with other behavioral disturbance: Secondary | ICD-10-CM | POA: Diagnosis not present

## 2022-07-10 DIAGNOSIS — K219 Gastro-esophageal reflux disease without esophagitis: Secondary | ICD-10-CM | POA: Diagnosis not present

## 2022-07-10 DIAGNOSIS — I5022 Chronic systolic (congestive) heart failure: Secondary | ICD-10-CM | POA: Diagnosis not present

## 2022-07-11 DIAGNOSIS — I358 Other nonrheumatic aortic valve disorders: Secondary | ICD-10-CM | POA: Diagnosis not present

## 2022-07-11 DIAGNOSIS — I4892 Unspecified atrial flutter: Secondary | ICD-10-CM | POA: Diagnosis not present

## 2022-07-11 DIAGNOSIS — A419 Sepsis, unspecified organism: Secondary | ICD-10-CM | POA: Diagnosis not present

## 2022-07-11 DIAGNOSIS — I480 Paroxysmal atrial fibrillation: Secondary | ICD-10-CM | POA: Diagnosis not present

## 2022-07-11 DIAGNOSIS — J449 Chronic obstructive pulmonary disease, unspecified: Secondary | ICD-10-CM | POA: Diagnosis not present

## 2022-07-11 DIAGNOSIS — K219 Gastro-esophageal reflux disease without esophagitis: Secondary | ICD-10-CM | POA: Diagnosis not present

## 2022-07-11 DIAGNOSIS — F32A Depression, unspecified: Secondary | ICD-10-CM | POA: Diagnosis not present

## 2022-07-11 DIAGNOSIS — Z7901 Long term (current) use of anticoagulants: Secondary | ICD-10-CM | POA: Diagnosis not present

## 2022-07-11 DIAGNOSIS — E039 Hypothyroidism, unspecified: Secondary | ICD-10-CM | POA: Diagnosis not present

## 2022-07-11 DIAGNOSIS — T826XXA Infection and inflammatory reaction due to cardiac valve prosthesis, initial encounter: Secondary | ICD-10-CM | POA: Diagnosis not present

## 2022-07-11 DIAGNOSIS — I442 Atrioventricular block, complete: Secondary | ICD-10-CM | POA: Diagnosis not present

## 2022-07-11 DIAGNOSIS — F0283 Dementia in other diseases classified elsewhere, unspecified severity, with mood disturbance: Secondary | ICD-10-CM | POA: Diagnosis not present

## 2022-07-11 DIAGNOSIS — E876 Hypokalemia: Secondary | ICD-10-CM | POA: Diagnosis not present

## 2022-07-11 DIAGNOSIS — I5022 Chronic systolic (congestive) heart failure: Secondary | ICD-10-CM | POA: Diagnosis not present

## 2022-07-11 DIAGNOSIS — Z7984 Long term (current) use of oral hypoglycemic drugs: Secondary | ICD-10-CM | POA: Diagnosis not present

## 2022-07-11 DIAGNOSIS — Z556 Problems related to health literacy: Secondary | ICD-10-CM | POA: Diagnosis not present

## 2022-07-11 DIAGNOSIS — F0284 Dementia in other diseases classified elsewhere, unspecified severity, with anxiety: Secondary | ICD-10-CM | POA: Diagnosis not present

## 2022-07-11 DIAGNOSIS — Z86711 Personal history of pulmonary embolism: Secondary | ICD-10-CM | POA: Diagnosis not present

## 2022-07-11 DIAGNOSIS — Z8616 Personal history of COVID-19: Secondary | ICD-10-CM | POA: Diagnosis not present

## 2022-07-11 DIAGNOSIS — I110 Hypertensive heart disease with heart failure: Secondary | ICD-10-CM | POA: Diagnosis not present

## 2022-07-11 DIAGNOSIS — M1612 Unilateral primary osteoarthritis, left hip: Secondary | ICD-10-CM | POA: Diagnosis not present

## 2022-07-11 DIAGNOSIS — E1140 Type 2 diabetes mellitus with diabetic neuropathy, unspecified: Secondary | ICD-10-CM | POA: Diagnosis not present

## 2022-07-11 DIAGNOSIS — F02818 Dementia in other diseases classified elsewhere, unspecified severity, with other behavioral disturbance: Secondary | ICD-10-CM | POA: Diagnosis not present

## 2022-07-11 DIAGNOSIS — I5033 Acute on chronic diastolic (congestive) heart failure: Secondary | ICD-10-CM | POA: Diagnosis not present

## 2022-07-13 DIAGNOSIS — F0284 Dementia in other diseases classified elsewhere, unspecified severity, with anxiety: Secondary | ICD-10-CM | POA: Diagnosis not present

## 2022-07-13 DIAGNOSIS — T826XXA Infection and inflammatory reaction due to cardiac valve prosthesis, initial encounter: Secondary | ICD-10-CM | POA: Diagnosis not present

## 2022-07-13 DIAGNOSIS — F32A Depression, unspecified: Secondary | ICD-10-CM | POA: Diagnosis not present

## 2022-07-13 DIAGNOSIS — Z7901 Long term (current) use of anticoagulants: Secondary | ICD-10-CM | POA: Diagnosis not present

## 2022-07-13 DIAGNOSIS — I358 Other nonrheumatic aortic valve disorders: Secondary | ICD-10-CM | POA: Diagnosis not present

## 2022-07-13 DIAGNOSIS — Z8616 Personal history of COVID-19: Secondary | ICD-10-CM | POA: Diagnosis not present

## 2022-07-13 DIAGNOSIS — E876 Hypokalemia: Secondary | ICD-10-CM | POA: Diagnosis not present

## 2022-07-13 DIAGNOSIS — I442 Atrioventricular block, complete: Secondary | ICD-10-CM | POA: Diagnosis not present

## 2022-07-13 DIAGNOSIS — A419 Sepsis, unspecified organism: Secondary | ICD-10-CM | POA: Diagnosis not present

## 2022-07-13 DIAGNOSIS — Z7984 Long term (current) use of oral hypoglycemic drugs: Secondary | ICD-10-CM | POA: Diagnosis not present

## 2022-07-13 DIAGNOSIS — Z556 Problems related to health literacy: Secondary | ICD-10-CM | POA: Diagnosis not present

## 2022-07-13 DIAGNOSIS — M1612 Unilateral primary osteoarthritis, left hip: Secondary | ICD-10-CM | POA: Diagnosis not present

## 2022-07-13 DIAGNOSIS — I110 Hypertensive heart disease with heart failure: Secondary | ICD-10-CM | POA: Diagnosis not present

## 2022-07-13 DIAGNOSIS — F0283 Dementia in other diseases classified elsewhere, unspecified severity, with mood disturbance: Secondary | ICD-10-CM | POA: Diagnosis not present

## 2022-07-13 DIAGNOSIS — I5033 Acute on chronic diastolic (congestive) heart failure: Secondary | ICD-10-CM | POA: Diagnosis not present

## 2022-07-13 DIAGNOSIS — I480 Paroxysmal atrial fibrillation: Secondary | ICD-10-CM | POA: Diagnosis not present

## 2022-07-13 DIAGNOSIS — J449 Chronic obstructive pulmonary disease, unspecified: Secondary | ICD-10-CM | POA: Diagnosis not present

## 2022-07-13 DIAGNOSIS — I5022 Chronic systolic (congestive) heart failure: Secondary | ICD-10-CM | POA: Diagnosis not present

## 2022-07-13 DIAGNOSIS — F02818 Dementia in other diseases classified elsewhere, unspecified severity, with other behavioral disturbance: Secondary | ICD-10-CM | POA: Diagnosis not present

## 2022-07-13 DIAGNOSIS — I4892 Unspecified atrial flutter: Secondary | ICD-10-CM | POA: Diagnosis not present

## 2022-07-13 DIAGNOSIS — E039 Hypothyroidism, unspecified: Secondary | ICD-10-CM | POA: Diagnosis not present

## 2022-07-13 DIAGNOSIS — K219 Gastro-esophageal reflux disease without esophagitis: Secondary | ICD-10-CM | POA: Diagnosis not present

## 2022-07-13 DIAGNOSIS — Z86711 Personal history of pulmonary embolism: Secondary | ICD-10-CM | POA: Diagnosis not present

## 2022-07-13 DIAGNOSIS — E1140 Type 2 diabetes mellitus with diabetic neuropathy, unspecified: Secondary | ICD-10-CM | POA: Diagnosis not present

## 2022-07-14 ENCOUNTER — Ambulatory Visit (HOSPITAL_BASED_OUTPATIENT_CLINIC_OR_DEPARTMENT_OTHER): Payer: Medicare Other | Admitting: Family

## 2022-07-14 ENCOUNTER — Encounter (HOSPITAL_BASED_OUTPATIENT_CLINIC_OR_DEPARTMENT_OTHER): Payer: Self-pay | Admitting: Family

## 2022-07-14 VITALS — BP 134/60 | HR 74 | Ht 62.0 in | Wt 221.0 lb

## 2022-07-14 DIAGNOSIS — I4892 Unspecified atrial flutter: Secondary | ICD-10-CM | POA: Diagnosis not present

## 2022-07-14 DIAGNOSIS — I4891 Unspecified atrial fibrillation: Secondary | ICD-10-CM

## 2022-07-14 DIAGNOSIS — I442 Atrioventricular block, complete: Secondary | ICD-10-CM

## 2022-07-14 DIAGNOSIS — Z950 Presence of cardiac pacemaker: Secondary | ICD-10-CM | POA: Diagnosis not present

## 2022-07-14 DIAGNOSIS — I5032 Chronic diastolic (congestive) heart failure: Secondary | ICD-10-CM | POA: Diagnosis not present

## 2022-07-14 DIAGNOSIS — D6859 Other primary thrombophilia: Secondary | ICD-10-CM

## 2022-07-14 MED ORDER — EMPAGLIFLOZIN 10 MG PO TABS: 10.0000 mg | ORAL_TABLET | Freq: Every day | ORAL | 3 refills | Status: DC

## 2022-07-14 MED ORDER — FUROSEMIDE 40 MG PO TABS: 40.0000 mg | ORAL_TABLET | Freq: Every day | ORAL | 2 refills | Status: DC

## 2022-07-14 MED ORDER — METOPROLOL TARTRATE 25 MG PO TABS: 25.0000 mg | ORAL_TABLET | Freq: Two times a day (BID) | ORAL | 3 refills | Status: DC

## 2022-07-14 MED ORDER — EMPAGLIFLOZIN 10 MG PO TABS
10.0000 mg | ORAL_TABLET | Freq: Every day | ORAL | 0 refills | Status: DC
Start: 2022-07-14 — End: 2022-10-13

## 2022-07-14 MED ORDER — APIXABAN 5 MG PO TABS
5.0000 mg | ORAL_TABLET | Freq: Two times a day (BID) | ORAL | 0 refills | Status: DC
Start: 2022-07-14 — End: 2022-08-14

## 2022-07-14 MED ORDER — APIXABAN 5 MG PO TABS: 5.0000 mg | ORAL_TABLET | Freq: Two times a day (BID) | ORAL | 3 refills | Status: DC

## 2022-07-14 NOTE — Progress Notes (Signed)
Office Visit    Patient Name: Jessica [name] Date of Encounter: 07/14/2022  PCP:  Jessica Garter, MD   [facility]  Cardiologist:  Jessica Si, MD  Advanced Practice Provider:  No care team member to display Electrophysiologist:  Jessica Prude, MD      Chief Complaint    Jessica [name] is a 89 y.o. female presents today for hospital follow up   Past Medical History    Past Medical History:  Diagnosis Date   Anxiety    Atrial fibrillation and flutter    detected by PPM interrogation (mostly atrial flutter)   AV block, 2nd degree 10/2012   s/p MDT ADDRL1 pacemaker implantation 10/10/2012 by Dr Jessica [name]   Chronic diastolic heart failure    Depression    GERD (gastroesophageal reflux disease)    HH (hiatus hernia)    History of pulmonary embolism 10/2008   Bilateral   Hyperlipidemia    Hypertension    Hypothyroidism    Obesity    Osteoarthritis    Peripheral neuropathy    Right leg   Presence of permanent cardiac pacemaker    Pulmonary nodule    in the lingula. Noted on pre TAVR CT. Will require follow up   Renal insufficiency 2010   S/P TAVR (transcatheter aortic valve replacement)    Shingles 09/17/2014   right lower quadrant   Type II or unspecified type diabetes mellitus without mention of complication, not stated as uncontrolled 2009   Past Surgical History:  Procedure Laterality Date   APPENDECTOMY     AV NODE ABLATION N/A 07/05/2017   Procedure: AV NODE ABLATION;  Surgeon: Jessica Range, MD;  Location: [facility] INVASIVE CV LAB;  Service: Cardiovascular;  Laterality: N/A;   CARPAL TUNNEL RELEASE     CATARACT EXTRACTION     CHOLECYSTECTOMY     COLONOSCOPY  2010   COLONOSCOPY WITH PROPOFOL N/A 07/25/2021   Procedure: COLONOSCOPY WITH PROPOFOL;  Surgeon: Jessica Form, MD;  Location: [facility] ENDOSCOPY;  Service: Gastroenterology;  Laterality: N/A;   FOREARM FRACTURE SURGERY  09/17/2008   HERNIA REPAIR     INGUINAL  HERNIA REPAIR     PACEMAKER INSERTION  10/10/2012   MDT ADDRL1 implanted for 2nd degree AV block by Dr Jessica [name]   PERMANENT PACEMAKER INSERTION N/A 10/10/2012   Procedure: PERMANENT PACEMAKER INSERTION;  Surgeon: Jessica Range, MD;  Location: [facility] CATH LAB;  Service: Cardiovascular;  Laterality: N/A;   RIGHT/LEFT HEART CATH AND CORONARY ANGIOGRAPHY N/A 12/30/2018   Procedure: RIGHT/LEFT HEART CATH AND CORONARY ANGIOGRAPHY;  Surgeon: Jessica Hazel, MD;  Location: [facility] INVASIVE CV LAB;  Service: Cardiovascular;  Laterality: N/A;   ROTATOR CUFF REPAIR     TEE WITHOUT CARDIOVERSION N/A 01/14/2019   Procedure: TRANSESOPHAGEAL ECHOCARDIOGRAM (TEE);  Surgeon: Jessica Hazel, MD;  Location: [facility] INVASIVE CV LAB;  Service: Open Heart Surgery;  Laterality: N/A;   TEE WITHOUT CARDIOVERSION N/A 04/05/2022   Procedure: TRANSESOPHAGEAL ECHOCARDIOGRAM (TEE);  Surgeon: Jessica Fus, MD;  Location: [facility] ENDOSCOPY;  Service: Cardiovascular;  Laterality: N/A;   TRANSCATHETER AORTIC VALVE REPLACEMENT, TRANSFEMORAL N/A 01/14/2019   Procedure: TRANSCATHETER AORTIC VALVE REPLACEMENT, TRANSFEMORAL;  Surgeon: Jessica Hazel, MD;  Location: [facility] INVASIVE CV LAB;  Service: Open Heart Surgery;  Laterality: N/A;    Allergies  Allergies  Allergen Reactions   Coreg [Carvedilol] Other (See Comments)    Weak legs   Relafen [Nabumetone] Other (See Comments)    Upset stomach  Calcium Other (See Comments)    Unknown reaction   Codeine Other (See Comments)    Unknown reaction   Lipitor [Atorvastatin] Other (See Comments)    Myalgias   Pacerone [Amiodarone] Other (See Comments)    Hand tremors   Vasotec [Enalapril] Cough   Vioxx [Rofecoxib] Other (See Comments)    Unknown reaction   Zocor [Simvastatin] Other (See Comments)    Myalgias    History of Present Illness    Jessica [name] is a 89 y.o. female with a hx of nonobstructive CAD, severe AS s/p TAVR with endocarditis, CHB s/p PPM, atrial  fibrillation s/p AV ablation 2019, HTN, HLD, DM2, chronic systolic and diastolic heart failure last seen while hospitalized.  Admitted 04/18/2022 with endocarditis with 3 cm vegetation on TAVR prosthesis by TEE 04/05/2022.  No lead vegetation noted.  Inadvertent finding of AKI in the setting of COVID.  On indefinite oral antibiotics.  Admitted 2/11 - 05/19/2022 with acute on chronic diastolic heart failure.  Diuresed 5.9 L.  On discharge oral Lasix, Jardiance, Entresto, metoprolol, Aldactone.  Recommended for discharge to SNF.  Pleasant lady who lives at home independently. Her niece is present at clinic visit today. Notes her endurance is not back to baseline. She is participating in [facility] PT/OT twice per week. Ambulating with Jessica [name] at home. Feels strength is improving. Weight at discharge 231lbs with weight today 221 lbs. Has scale at home but has misplaced. Notes she is taking Furosemide  QD (prescribed as  QD) and not taking Spironolactone. She notes she has not been taking Eliquis due to concerns regarding cost.   Reports no lightheadedness nor dizziness. Reports mild exertional dyspneaw hich she attributes to increasing her exercising. No orthopnea, PND, edema.   EKGs/Labs/Other Studies Reviewed:   The following studies were reviewed today: Cardiac Studies & Procedures   CARDIAC CATHETERIZATION  CARDIAC CATHETERIZATION 12/30/2018  Narrative  Prox RCA lesion is 40% stenosed.  Dist RCA lesion is 30% stenosed.  1. Mild non-obstructive CAD 2. Severe aortic valve stenosis (mean gradient 35 mmHg, Peak to peak gradient 44 mmHg, AVA 0.78 cm2).  Will continue workup for TAVR.  Findings Coronary Findings Diagnostic  Dominance: Right  Left Anterior Descending Vessel is large.  Left Circumflex Vessel is moderate in size.  Right Coronary Artery Vessel is large. Prox RCA lesion is 40% stenosed. Dist RCA lesion is 30% stenosed.  Intervention  No interventions have been  documented.     ECHOCARDIOGRAM  ECHOCARDIOGRAM COMPLETE 03/29/2022  Narrative ECHOCARDIOGRAM REPORT    Patient Name:   Jessica [name] Date of Exam: 03/29/2022 Medical Rec #:  [id number]          Height:       65.0 in Accession #:    [id number]         Weight:       240.0 lb Date of Birth:  May 15, 1933          BSA:          2.138 m Patient Age:    88 years           BP:           119/46 mmHg Patient Gender: F                  HR:           82 bpm. Exam Location:  Inpatient  Procedure: 2D Echo, Cardiac Doppler, Color Doppler and Intracardiac Opacification Agent  Indications:  Congestive Heart Failure I50.9  History:        Patient has prior history of Echocardiogram examinations, most recent 01/07/2020. Pacemaker, Arrythmias:Atrial Flutter and Atrial Fibrillation; Risk Factors:Diabetes, Hypertension, Dyslipidemia and GERD. Aortic Valve: 26 mm Sapien prosthetic, stented (TAVR) valve is present in the aortic position. Procedure Date: 01/14/19.  Sonographer:    Eulah Pont RDCS Referring Phys: [id number] Elenore Paddy REESE   Sonographer Comments: Patient is obese. IMPRESSIONS   1. The aortic valve has been repaired/replaced. There is a 26 mm Sapien prosthetic (TAVR) valve present in the aortic position. Procedure Date: 01/14/19. Aortic valve mean gradient measures 15.0 mmHg. Aortic valve Vmax measures 2.61 m/s. EOA 1.9cm2, DI 0.5. There is mild paravalvular leak located at the 2 o'clock position on PSAX view. 2. Left ventricular ejection fraction, by estimation, is 55 to 60%. The left ventricle has normal function. Septal motion is paradoxical in the setting of RV pacing. There is moderate asymmetric left ventricular hypertrophy of the basal-septal segment. Left ventricular diastolic parameters are consistent with Grade II diastolic dysfunction (pseudonormalization). 3. Right ventricular systolic function is normal. The right ventricular size is normal. There is normal  pulmonary artery systolic pressure. The estimated right ventricular systolic pressure is 35.2 mmHg. 4. Left atrial size was mildly dilated. 5. The mitral valve is degenerative. Trivial mitral valve regurgitation. Moderate mitral annular calcification. 6. Aortic dilatation noted. There is borderline dilatation of the ascending aorta, measuring 37 mm. 7. The inferior vena cava is dilated in size with >50% respiratory variability, suggesting right atrial pressure of 8 mmHg.  Comparison(s): Compared to prior TTE in 2021, the mean aortic gradient is 15 from but EOA similar at ~2cm2.  FINDINGS Left Ventricle: Left ventricular ejection fraction, by estimation, is 55 to 60%. The left ventricle has normal function. The left ventricle has no regional wall motion abnormalities. Definity contrast agent was given IV to delineate the left ventricular endocardial borders. The left ventricular internal cavity size was normal in size. There is moderate asymmetric left ventricular hypertrophy of the basal-septal segment. Abnormal (paradoxical) septal motion, consistent with RV pacemaker. Left ventricular diastolic parameters are consistent with Grade II diastolic dysfunction (pseudonormalization).  Right Ventricle: The right ventricular size is normal. Right vetricular wall thickness was not well visualized. Right ventricular systolic function is normal. There is normal pulmonary artery systolic pressure. The tricuspid regurgitant velocity is 2.61 m/s, and with an assumed right atrial pressure of 8 mmHg, the estimated right ventricular systolic pressure is 35.2 mmHg.  Left Atrium: Left atrial size was mildly dilated.  Right Atrium: Right atrial size was normal in size.  Pericardium: There is no evidence of pericardial effusion.  Mitral Valve: The mitral valve is degenerative in appearance. There is mild thickening of the mitral valve leaflet(s). There is mild calcification of the mitral valve leaflet(s).  Moderate mitral annular calcification. Trivial mitral valve regurgitation.  Tricuspid Valve: The tricuspid valve is normal in structure. Tricuspid valve regurgitation is mild.  Aortic Valve: Mild paravalvular leak located at the 2o'clock position on PSAX view. DI 0.5. The aortic valve has been repaired/replaced. Aortic valve mean gradient measures 15.0 mmHg. Aortic valve peak gradient measures 27.2 mmHg. EOA 1.9cm2. There is a 26 mm Sapien prosthetic, stented (TAVR) valve present in the aortic position. Procedure Date: 01/14/19.  Pulmonic Valve: The pulmonic valve was normal in structure. Pulmonic valve regurgitation is trivial.  Aorta: Aortic dilatation noted. There is borderline dilatation of the ascending aorta, measuring 37 mm.  Venous: The inferior vena  cava is dilated in size with greater than 50% respiratory variability, suggesting right atrial pressure of 8 mmHg.  IAS/Shunts: The atrial septum is grossly normal.  Additional Comments: A device lead is visualized.   LEFT VENTRICLE PLAX 2D LVIDd:         5.20 cm     Diastology LVIDs:         3.00 cm     LV e' medial:    4.56 cm/s LV PW:         0.80 cm     LV E/e' medial:  34.2 LV IVS:        0.80 cm     LV e' lateral:   4.56 cm/s LVOT diam:     2.60 cm     LV E/e' lateral: 34.2 LV SV:         127 LV SV Index:   60 LVOT Area:     5.31 cm  LV Volumes (MOD) LV vol d, MOD A2C: 75.8 ml LV vol d, MOD A4C: 72.9 ml LV vol s, MOD A2C: 34.8 ml LV vol s, MOD A4C: 32.8 ml LV SV MOD A2C:     41.0 ml LV SV MOD A4C:     72.9 ml LV SV MOD BP:      41.9 ml  RIGHT VENTRICLE RV S prime:     14.90 cm/s TAPSE (M-mode): 1.8 cm  LEFT ATRIUM             Index        RIGHT ATRIUM           Index LA diam:        5.00 cm 2.34 cm/m   RA Area:     16.90 cm LA Vol (A2C):   72.9 ml 34.11 ml/m  RA Volume:   41.40 ml  19.37 ml/m LA Vol (A4C):   81.4 ml 38.08 ml/m LA Biplane Vol: 77.1 ml 36.07 ml/m AORTIC VALVE AV Area (Vmax):    2.56  cm AV Area (Vmean):   2.46 cm AV Area (VTI):     2.68 cm AV Vmax:           261.00 cm/s AV Vmean:          185.667 cm/s AV VTI:            0.475 m AV Peak Grad:      27.2 mmHg AV Mean Grad:      15.0 mmHg LVOT Vmax:         126.00 cm/s LVOT Vmean:        85.900 cm/s LVOT VTI:          0.240 m LVOT/AV VTI ratio: 0.50  AORTA Ao Root diam: 3.30 cm Ao Asc diam:  3.70 cm  MITRAL VALVE                TRICUSPID VALVE MV Area (PHT): 3.91 cm     TR Peak grad:   27.2 mmHg MV Decel Time: 194 msec     TR Vmax:        261.00 cm/s MV E velocity: 156.00 cm/s MV A velocity: 84.60 cm/s   SHUNTS MV E/A ratio:  1.84         Systemic VTI:  0.24 m Systemic Diam: 2.60 cm  Laurance Flatten MD Electronically signed by Laurance Flatten MD Signature Date/Time: 03/29/2022/11:52:14 AM    Final   TEE  ECHO TEE 04/05/2022  Narrative TRANSESOPHOGEAL ECHO REPORT  Patient Name:   BRONNIE VASSEUR Date of Exam: 04/05/2022 Medical Rec #:  [id number]          Height:       62.0 in Accession #:    [id number]         Weight:       263.0 lb Date of Birth:  July 16, 1933          BSA:          2.148 m Patient Age:    88 years           BP:           110/53 mmHg Patient Gender: F                  HR:           92 bpm. Exam Location:  Inpatient  Procedure: 3D Echo, Transesophageal Echo, Cardiac Doppler and Color Doppler  Indications:     Endocarditis  History:         Patient has prior history of Echocardiogram examinations, most recent 03/29/2022. Pacemaker and Abnormal ECG, Aortic Valve Disease, Arrythmias:Atrial Fibrillation, Signs/Symptoms:Bacteremia, Altered Mental Status, Dyspnea and Shortness of Breath; Risk Factors:Hypertension, Diabetes and Dyslipidemia. Aortic stenosis. Aortic Valve: 26 mm Sapien prosthetic, stented (TAVR) valve is present in the aortic position. Procedure Date: 01/14/2019.  Sonographer:     Sheralyn Boatman RDCS Referring Phys:  [id number] HAO MENG Diagnosing Phys: Mary  Branch  PROCEDURE: After discussion of the risks and benefits of a TEE, an informed consent was obtained from the patient. The transesophogeal probe was passed without difficulty through the esophogus of the patient. Imaged were obtained with the patient in a left lateral decubitus position. Sedation performed by different physician. The patient was monitored while under deep sedation. Anesthestetic sedation was provided intravenously by Anesthesiology: 294mg  of Propofol, 100mg  of Lidocaine. The patient's vital signs; including heart rate, blood pressure, and oxygen saturation; remained stable throughout the procedure. The patient developed no complications during the procedure.  IMPRESSIONS   1. Left ventricular ejection fraction, by estimation, is 55 to 60%. The left ventricle has normal function. 2. No vegetation seen on the device lead. Right ventricular systolic function is normal. The right ventricular size is normal. 3. No left atrial/left atrial appendage thrombus was detected. 4. Moderate pleural effusion. 5. The mitral valve is degenerative. Trivial mitral valve regurgitation. Moderate mitral annular calcification. 6. 26 mm Sapien prosthetic valve, procedure date 01/14/2019. 3 cm x 0.3 cm vegetation on the TAVR on the RCC (seen best image 81). No distinct abscess. Mild PVL. No significant rocking or dehiscence. There is a 26 mm Sapien prosthetic (TAVR) valve present in the aortic position. Procedure Date: 01/14/2019. 7. There is mild (Grade II) plaque.  Conclusion(s)/Recommendation(s): 3cm vegetation seen on the TAVR prosthesis. No vegetation seen on the device lead or the native cardiac valves.  FINDINGS Left Ventricle: Left ventricular ejection fraction, by estimation, is 55 to 60%. The left ventricle has normal function. The left ventricular internal cavity size was normal in size.  Right Ventricle: No vegetation seen on the device lead. The right ventricular size is normal. Right  ventricular systolic function is normal.  Left Atrium: No left atrial/left atrial appendage thrombus was detected.  Mitral Valve: The mitral valve is degenerative in appearance. Moderate mitral annular calcification. Trivial mitral valve regurgitation.  Tricuspid Valve: Tricuspid valve regurgitation is mild.  Aortic Valve: 26 mm Sapien prosthetic valve, procedure date 01/14/2019. 3 cm x 0.3  cm vegetation on the TAVR on the RCC (seen best image 81). No distinct abscess. Mild PVL. No significant rocking or dehiscence. There is a 26 mm Sapien prosthetic, stented (TAVR) valve present in the aortic position. Procedure Date: 01/14/2019.  Pulmonic Valve: Pulmonic valve regurgitation is trivial.  Aorta: There is mild (Grade II) plaque.  IAS/Shunts: No atrial level shunt detected by color flow Doppler.  Additional Comments: A device lead is visualized. There is a moderate pleural effusion. Spectral Doppler performed.  Carolan Clines Electronically signed by Carolan Clines Signature Date/Time: 04/05/2022/12:04:22 PM    Final (Updated)    CT SCANS  CT CORONARY MORPH W/CTA COR W/SCORE 01/07/2019  Addendum 01/07/2019 10:55 PM ADDENDUM REPORT: 01/07/2019 22:52  CLINICAL DATA:  66 -year-old female with severe aortic stenosis being evaluated for a TAVR procedure.  EXAM: Cardiac TAVR CT  TECHNIQUE: The patient was scanned on a [employer]. A 120 kV retrospective scan was triggered in the descending thoracic aorta at 111 HU's. Gantry rotation speed was 250 msecs and collimation was .6 mm. No beta blockade or nitro were given. The 3D data set was reconstructed in 5% intervals of the R-R cycle. Systolic and diastolic phases were analyzed on a dedicated work station using MPR, MIP and VRT modes. The patient received 80 cc of contrast.  FINDINGS: Aortic Root:  Aortic valve: Trileaflet  Aortic valve calcium score: 2250  Aortic annulus:  Diameter: 29mm x 24mm  Perimeter:  81mm  Area: 496 mm^2  Calcifications: No calcifications  Coronary height: Min Left - 17mm, Max Left - 21mm; Min Right - 13mm  Sinotubular height: Left cusp - 23mm; Right cusp - 21mm; Noncoronary cusp - 22mm  LVOT (as measured 3 mm below the annulus):  Diameter: 30mm x 23mm  Area: 508 mm^2  Calcifications: No calcifications  Aortic sinus width: Left cusp - 34mm; Right cusp - 35mm; Noncoronary cusp - 35mm  Sinotubular junction width: 31mm x 29mm  Optimum Fluoroscopic Angle for Delivery (centered on right coronary cusp): LAO 1 CRA 3  Cardiac:  Right atrium: Normal size  Right ventricle: Normal size  Pulmonary arteries: Normal size  Pulmonary veins: Normal configuration  Left atrium: Moderate enlargement  Left ventricle: Normal size  Pericardium: Normal thickness  Coronary arteries: Moderate calcifications  IMPRESSION: 1.  Severely calcified trileaflet aortic valve (calcium score 2250)  2. Aortic annulus measures 29mm x 24mm with perimeter 81mm and area 496 mm^2. No annular or LVOT calcifications  3. Sufficient coronary to annulus distance: 17mm to left main, 13mm to right coronary artery  4. Optimum Fluoroscopic Angle for Delivery (centered on right coronary cusp): LAO 1 CRA 3   Electronically Signed By: Epifanio Lesches MD On: 01/07/2019 22:52  Narrative EXAM: OVER-READ INTERPRETATION  CT CHEST  The following report is an over-read performed by radiologist Dr. Trudie Reed of [facility] on 01/07/2019. This over-read does not include interpretation of cardiac or coronary anatomy or pathology. The coronary calcium score/coronary CTA interpretation by the cardiologist is attached.  COMPARISON:  Chest CTA 10/07/2008.  FINDINGS: Extracardiac findings were described separately under dictation for contemporaneously obtained CTA chest, abdomen and pelvis.  IMPRESSION: Please see separate dictation for contemporaneously obtained  CTA chest, abdomen and pelvis dated 01/07/2019 for full description of relevant extracardiac findings.  Electronically Signed: By: Trudie Reed M.D. On: 01/07/2019 15:05           EKG:  EKG is not ordered today.    Recent Labs: 03/28/2022: TSH  1.460 05/14/2022: B Natriuretic Peptide 907.2 05/16/2022: Magnesium 1.9 06/20/2022: ALT 21; BUN 19; Creat 1.09; Hemoglobin 11.8; Platelets 293; Potassium 5.0; Sodium 137  Recent Lipid Panel    Component Value Date/Time   CHOL 207 (H) 07/12/2020 1224   TRIG 277 (H) 07/12/2020 1224   HDL 39 (L) 07/12/2020 1224   CHOLHDL 5.3 (H) 07/12/2020 1224   CHOLHDL 5 02/19/2017 1001   VLDL 36.6 02/19/2017 1001   LDLCALC 119 (H) 07/12/2020 1224   LDLDIRECT 155.9 09/30/2012 0836    Risk Assessment/Calculations:   CHA2DS2-VASc Score = 7   This indicates a 11.2% annual risk of stroke. The patient's score is based upon: CHF History: 1 HTN History: 1 Diabetes History: 1 Stroke History: 0 Vascular Disease History: 1 Age Score: 2 Gender Score: 1     Home Medications   Current Meds  Medication Sig   albuterol (VENTOLIN HFA) 108 (90 Base) MCG/ACT inhaler Inhale 2 puffs into the lungs every 6 (six) hours as needed for wheezing or shortness of breath.   allopurinol (ZYLOPRIM) 100 MG tablet TAKE 1 TABLET BY MOUTH DAILY (Patient taking differently: Take 100 mg by mouth daily.)   apixaban (ELIQUIS) 5 MG TABS tablet Take 1 tablet (5 mg total) by mouth 2 (two) times daily.   b complex vitamins tablet Take 1 tablet by mouth daily.   cefadroxil (DURICEF) 500 MG capsule Take 1 capsule (500 mg total) by mouth 2 (two) times daily.   Cholecalciferol 1000 UNITS tablet Take 1,000 Units by mouth daily.   empagliflozin (JARDIANCE) 10 MG TABS tablet Take 1 tablet (10 mg total) by mouth daily.   ezetimibe (ZETIA) 10 MG tablet TAKE 1 TABLET BY MOUTH DAILY   furosemide (LASIX) 40 MG tablet Take 1 tablet (40 mg total) by mouth daily.   levothyroxine (SYNTHROID) 75  MCG tablet TAKE 1 TABLET BY MOUTH DAILY (Patient taking differently: Take by mouth daily before breakfast.)   metFORMIN (GLUCOPHAGE) 500 MG tablet TAKE 1 TABLET BY MOUTH TWICE  DAILY WITH MEALS   Multiple Vitamin (MULTIVITAMIN) tablet Take 1 tablet by mouth daily.   nystatin powder Apply 1 Application topically daily. Apply to under breast topically every day shift for redness/moisture.   repaglinide (PRANDIN) 2 MG tablet TAKE 2 TABLETS BY MOUTH 3  TIMES DAILY BEFORE MEALS (Patient taking differently: Take 2 mg by mouth 3 (three) times daily before meals.)   rosuvastatin (CRESTOR) 10 MG tablet TAKE 1 TABLET BY MOUTH UP TO 3  TIMES WEEKLY AS TOLERATED (Patient taking differently: Take 10 mg by mouth every Monday, Wednesday, and Friday.)   saccharomyces boulardii (FLORASTOR) 250 MG capsule Take 250 mg by mouth 2 (two) times daily.   sacubitril-valsartan (ENTRESTO) 97-103 MG Take 1 tablet by mouth 2 (two) times daily.   spironolactone (ALDACTONE) 25 MG tablet Take 0.5 tablets (12.5 mg total) by mouth daily.   temazepam (RESTORIL) 7.5 MG capsule Take 7.5 mg by mouth at bedtime.   venlafaxine XR (EFFEXOR-XR) 75 MG 24 hr capsule TAKE 1 CAPSULE BY MOUTH  DAILY WITH BREAKFAST (Patient taking differently: Take 75 mg by mouth daily.)     Review of Systems      All other systems reviewed and are otherwise negative except as noted above.  Physical Exam    VS:  BP 134/60   Pulse 74   Ht 5\' 2"  (1.575 m)   Wt 221 lb (100.2 kg)   BMI 40.42 kg/m  , BMI Body mass index is 40.42 kg/m.  Wt Readings from Last 3 Encounters:  07/14/22 221 lb (100.2 kg)  07/04/22 217 lb (98.4 kg)  06/26/22 217 lb (98.4 kg)    GEN: Well nourished, overweight, well developed, in no acute distress. HEENT: normal. Neck: Supple, no JVD, carotid bruits, or masses. Cardiac: RRR, no murmurs, rubs, or gallops. No clubbing, cyanosis, edema.  Radials/PT 2+ and equal bilaterally.  Respiratory:  Respirations regular and unlabored,  clear to auscultation bilaterally. GI: Soft, nontender, nondistended. MS: No deformity or atrophy. Skin: Warm and dry, no rash. Neuro:  Strength and sensation are intact. Psych: Normal affect.  Assessment & Plan    Medication management - Niece plans to assist with the following. Already has Entresto patient assistance. Samples and patient assistance paperwork initiated for Jardiance. Not taking Eliquis due to cost concerns. Reiterated CVA risk with atrial fib. Provided samples and patient assistance paperwork. Given number for [facility] and encouraged to contact regarding LIS.   Atrial fib s/p ablation / Hypercoaguable state - NSR by auscultation. Denies palpitations. Continue Lopressor 25mg  BID, Eliquis 5mg  BID. Denies bleeding complications.  Update CBC. Previously declined Watchman. CHA2DS2-VASc Score = 7 [CHF History: 1, HTN History: 1, Diabetes History: 1, Stroke History: 0, Vascular Disease History: 1, Age Score: 2, Gender Score: 1].  Therefore, the patient's annual risk of stroke is 11.2 %.      Chronic systolic and diastolic heart failure -grossly euvolemic on exam.  She is taking Lasix 80 mg daily (prescribed 40 mg daily) and not taking spironolactone.  We will update BMP/BNP.  Pending results adjust medical therapy for protection of renal function and optimal control. Low sodium diet, fluid restriction <2L, and daily weights encouraged. Educated to contact our office for weight gain of 2 lbs overnight or 5 lbs in one week.   S/p TAVR / Endocarditis - Completed IV abx now on indefinite PO abx per ID. No lead vegetation per imaging. Per EP, only plan to extract PPM if needs valvular intervention. Is PPM dependent.  CHB s/p PPM - Follows with EP        Disposition: Follow up  in 3-4 months  with Jessica Si, MD or APP.  Signed, Alver Sorrow, NP 07/14/2022, 10:50 AM [facility]

## 2022-07-14 NOTE — Patient Instructions (Signed)
Medication Instructions:  Your physician recommends that you continue on your current medications as directed. Please refer to the Current Medication list given to you today.  *If you need a refill on your cardiac medications before your next appointment, please call your pharmacy*   Lab Work: Your physician recommends that you return for lab work today- Bmp, BNP, and CBC  If you have labs (blood work) drawn today and your tests are completely normal, you will receive your results only by: MyChart Message (if you have MyChart) OR A paper copy in the mail If you have any lab test that is abnormal or we need to change your treatment, we will call you to review the results.  Follow-Up: At [facility], you and your health needs are our priority.  As part of our continuing mission to provide you with exceptional heart care, we have created designated Provider Care Teams.  These Care Teams include your primary Cardiologist (physician) and Advanced Practice Providers (APPs -  Physician Assistants and Nurse Practitioners) who all work together to provide you with the care you need, when you need it.  We recommend signing up for the patient portal called "MyChart".  Sign up information is provided on this After Visit Summary.  MyChart is used to connect with patients for Virtual Visits (Telemedicine).  Patients are able to view lab/test results, encounter notes, upcoming appointments, etc.  Non-urgent messages can be sent to your provider as well.   To learn more about what you can do with MyChart, go to [email or web address].    Your next appointment:   3-4 month(s)  Provider:   Chilton Si, MD or Gillian Shields, NP    Other Instructions We have given you samples and patient assistance forms today, please fill out these forms and return them to the office ASAP. You need to also contact the SHIPP.

## 2022-07-14 NOTE — Addendum Note (Signed)
Addended by: Alver Sorrow on: 07/14/2022 06:03 PM   Modules accepted: Orders

## 2022-07-17 ENCOUNTER — Telehealth: Payer: Self-pay

## 2022-07-17 ENCOUNTER — Telehealth (HOSPITAL_BASED_OUTPATIENT_CLINIC_OR_DEPARTMENT_OTHER): Payer: Self-pay | Admitting: Cardiovascular Disease

## 2022-07-17 ENCOUNTER — Telehealth (HOSPITAL_COMMUNITY): Payer: Self-pay | Admitting: Licensed Clinical Social Worker

## 2022-07-17 DIAGNOSIS — A419 Sepsis, unspecified organism: Secondary | ICD-10-CM | POA: Diagnosis not present

## 2022-07-17 DIAGNOSIS — F32A Depression, unspecified: Secondary | ICD-10-CM | POA: Diagnosis not present

## 2022-07-17 DIAGNOSIS — F0284 Dementia in other diseases classified elsewhere, unspecified severity, with anxiety: Secondary | ICD-10-CM | POA: Diagnosis not present

## 2022-07-17 DIAGNOSIS — J449 Chronic obstructive pulmonary disease, unspecified: Secondary | ICD-10-CM | POA: Diagnosis not present

## 2022-07-17 DIAGNOSIS — M1612 Unilateral primary osteoarthritis, left hip: Secondary | ICD-10-CM | POA: Diagnosis not present

## 2022-07-17 DIAGNOSIS — I442 Atrioventricular block, complete: Secondary | ICD-10-CM | POA: Diagnosis not present

## 2022-07-17 DIAGNOSIS — I480 Paroxysmal atrial fibrillation: Secondary | ICD-10-CM | POA: Diagnosis not present

## 2022-07-17 DIAGNOSIS — Z556 Problems related to health literacy: Secondary | ICD-10-CM | POA: Diagnosis not present

## 2022-07-17 DIAGNOSIS — Z7901 Long term (current) use of anticoagulants: Secondary | ICD-10-CM | POA: Diagnosis not present

## 2022-07-17 DIAGNOSIS — E1140 Type 2 diabetes mellitus with diabetic neuropathy, unspecified: Secondary | ICD-10-CM | POA: Diagnosis not present

## 2022-07-17 DIAGNOSIS — I5032 Chronic diastolic (congestive) heart failure: Secondary | ICD-10-CM

## 2022-07-17 DIAGNOSIS — F02818 Dementia in other diseases classified elsewhere, unspecified severity, with other behavioral disturbance: Secondary | ICD-10-CM | POA: Diagnosis not present

## 2022-07-17 DIAGNOSIS — F0283 Dementia in other diseases classified elsewhere, unspecified severity, with mood disturbance: Secondary | ICD-10-CM | POA: Diagnosis not present

## 2022-07-17 DIAGNOSIS — T826XXA Infection and inflammatory reaction due to cardiac valve prosthesis, initial encounter: Secondary | ICD-10-CM | POA: Diagnosis not present

## 2022-07-17 DIAGNOSIS — I5033 Acute on chronic diastolic (congestive) heart failure: Secondary | ICD-10-CM | POA: Diagnosis not present

## 2022-07-17 DIAGNOSIS — K219 Gastro-esophageal reflux disease without esophagitis: Secondary | ICD-10-CM | POA: Diagnosis not present

## 2022-07-17 DIAGNOSIS — I4892 Unspecified atrial flutter: Secondary | ICD-10-CM | POA: Diagnosis not present

## 2022-07-17 DIAGNOSIS — Z7984 Long term (current) use of oral hypoglycemic drugs: Secondary | ICD-10-CM | POA: Diagnosis not present

## 2022-07-17 DIAGNOSIS — E876 Hypokalemia: Secondary | ICD-10-CM | POA: Diagnosis not present

## 2022-07-17 DIAGNOSIS — I110 Hypertensive heart disease with heart failure: Secondary | ICD-10-CM | POA: Diagnosis not present

## 2022-07-17 DIAGNOSIS — I358 Other nonrheumatic aortic valve disorders: Secondary | ICD-10-CM | POA: Diagnosis not present

## 2022-07-17 DIAGNOSIS — E039 Hypothyroidism, unspecified: Secondary | ICD-10-CM | POA: Diagnosis not present

## 2022-07-17 DIAGNOSIS — Z86711 Personal history of pulmonary embolism: Secondary | ICD-10-CM | POA: Diagnosis not present

## 2022-07-17 DIAGNOSIS — I5022 Chronic systolic (congestive) heart failure: Secondary | ICD-10-CM | POA: Diagnosis not present

## 2022-07-17 DIAGNOSIS — Z8616 Personal history of COVID-19: Secondary | ICD-10-CM | POA: Diagnosis not present

## 2022-07-17 NOTE — Telephone Encounter (Signed)
Calling to get help with filling out forms for patient assistance. Please advise

## 2022-07-17 NOTE — Progress Notes (Signed)
   Care Guide Note  07/17/2022 Name: MCKYLA HEIDINGER MRN: [id number] DOB: 1934/01/25  Referred by: Tresa Garter, MD Reason for referral : Care Coordination (Outreach to schedule referral )   [name] is a 89 y.o. year old female who is a primary care patient of Plotnikov, Georgina Quint, MD. [name] was referred to the pharmacist for assistance related to DM.    Successful contact was made with the patient to discuss pharmacy services. Patient declines engagement at this time. Contact information was provided to the patient should they wish to reach out for assistance at a later time.  Penne Lash, RMA Care Guide [facility]  [place], [place] [address] Direct Dial: [phone] [email or web address]

## 2022-07-17 NOTE — Telephone Encounter (Signed)
H&V Care Navigation CSW Progress Note  Clinical Social Worker consulted to help evaluate potential assistance for patient for medication concerns.  Cardiology provider has given PAP for Eliquis to help with expense- CSW consulted [facility] to help pt follow up with this application to ensure it is completed.  CSW also consulted [facility] to help with med adherence due to multiple mistakes in taking medications identified during last visit.   SDOH Screenings   Food Insecurity: No Food Insecurity (07/04/2022)  Housing: Low Risk  (07/04/2022)  Transportation Needs: No Transportation Needs (07/04/2022)  Utilities: Not At Risk (07/04/2022)  Alcohol Screen: Low Risk  (07/04/2022)  Depression (PHQ2-9): Low Risk  (07/04/2022)  Financial Resource Strain: Low Risk  (07/04/2022)  Physical Activity: Inactive (07/04/2022)  Social Connections: Socially Isolated (07/04/2022)  Stress: No Stress Concern Present (07/04/2022)  Tobacco Use: Low Risk  (07/14/2022)    Burna Sis, LCSW Clinical Social Worker [facility] Desk#: [phone] Cell#: [phone]

## 2022-07-17 NOTE — Telephone Encounter (Signed)
Returned call to patient, patient had questions about insurance portion of application. Questions answered, she will bring the applications up to the office for [place] to submit.

## 2022-07-18 DIAGNOSIS — F02818 Dementia in other diseases classified elsewhere, unspecified severity, with other behavioral disturbance: Secondary | ICD-10-CM | POA: Diagnosis not present

## 2022-07-18 DIAGNOSIS — I110 Hypertensive heart disease with heart failure: Secondary | ICD-10-CM | POA: Diagnosis not present

## 2022-07-18 DIAGNOSIS — I5022 Chronic systolic (congestive) heart failure: Secondary | ICD-10-CM | POA: Diagnosis not present

## 2022-07-18 DIAGNOSIS — I442 Atrioventricular block, complete: Secondary | ICD-10-CM | POA: Diagnosis not present

## 2022-07-18 DIAGNOSIS — Z86711 Personal history of pulmonary embolism: Secondary | ICD-10-CM | POA: Diagnosis not present

## 2022-07-18 DIAGNOSIS — I5033 Acute on chronic diastolic (congestive) heart failure: Secondary | ICD-10-CM | POA: Diagnosis not present

## 2022-07-18 DIAGNOSIS — E039 Hypothyroidism, unspecified: Secondary | ICD-10-CM | POA: Diagnosis not present

## 2022-07-18 DIAGNOSIS — Z556 Problems related to health literacy: Secondary | ICD-10-CM | POA: Diagnosis not present

## 2022-07-18 DIAGNOSIS — I358 Other nonrheumatic aortic valve disorders: Secondary | ICD-10-CM | POA: Diagnosis not present

## 2022-07-18 DIAGNOSIS — I4892 Unspecified atrial flutter: Secondary | ICD-10-CM | POA: Diagnosis not present

## 2022-07-18 DIAGNOSIS — F0283 Dementia in other diseases classified elsewhere, unspecified severity, with mood disturbance: Secondary | ICD-10-CM | POA: Diagnosis not present

## 2022-07-18 DIAGNOSIS — K219 Gastro-esophageal reflux disease without esophagitis: Secondary | ICD-10-CM | POA: Diagnosis not present

## 2022-07-18 DIAGNOSIS — Z7984 Long term (current) use of oral hypoglycemic drugs: Secondary | ICD-10-CM | POA: Diagnosis not present

## 2022-07-18 DIAGNOSIS — J449 Chronic obstructive pulmonary disease, unspecified: Secondary | ICD-10-CM | POA: Diagnosis not present

## 2022-07-18 DIAGNOSIS — E876 Hypokalemia: Secondary | ICD-10-CM | POA: Diagnosis not present

## 2022-07-18 DIAGNOSIS — M1612 Unilateral primary osteoarthritis, left hip: Secondary | ICD-10-CM | POA: Diagnosis not present

## 2022-07-18 DIAGNOSIS — F32A Depression, unspecified: Secondary | ICD-10-CM | POA: Diagnosis not present

## 2022-07-18 DIAGNOSIS — Z7901 Long term (current) use of anticoagulants: Secondary | ICD-10-CM | POA: Diagnosis not present

## 2022-07-18 DIAGNOSIS — F0284 Dementia in other diseases classified elsewhere, unspecified severity, with anxiety: Secondary | ICD-10-CM | POA: Diagnosis not present

## 2022-07-18 DIAGNOSIS — E1140 Type 2 diabetes mellitus with diabetic neuropathy, unspecified: Secondary | ICD-10-CM | POA: Diagnosis not present

## 2022-07-18 DIAGNOSIS — I480 Paroxysmal atrial fibrillation: Secondary | ICD-10-CM | POA: Diagnosis not present

## 2022-07-18 DIAGNOSIS — T826XXA Infection and inflammatory reaction due to cardiac valve prosthesis, initial encounter: Secondary | ICD-10-CM | POA: Diagnosis not present

## 2022-07-18 DIAGNOSIS — Z8616 Personal history of COVID-19: Secondary | ICD-10-CM | POA: Diagnosis not present

## 2022-07-18 DIAGNOSIS — A419 Sepsis, unspecified organism: Secondary | ICD-10-CM | POA: Diagnosis not present

## 2022-07-18 NOTE — Telephone Encounter (Signed)
Received fax back from [facility] that application for Eliquis patient assistance is being reviewed.   Received fax back from [facility] patient assistance foundation that they qualify for low income subsidy assistance and that they need to call Social security at [phone]. If they are denied through this program, we will need rejection letter to submit to patient assistance.   Returned call to patient's niece, explained the above information and provided the phone number to call. Patient's niece will call tomorrow to apply for low income subsidy and get rejection letter if needed to send back to [facility] patient assistance.

## 2022-07-19 ENCOUNTER — Telehealth: Payer: Self-pay

## 2022-07-19 DIAGNOSIS — F0284 Dementia in other diseases classified elsewhere, unspecified severity, with anxiety: Secondary | ICD-10-CM | POA: Diagnosis not present

## 2022-07-19 DIAGNOSIS — I358 Other nonrheumatic aortic valve disorders: Secondary | ICD-10-CM | POA: Diagnosis not present

## 2022-07-19 DIAGNOSIS — Z86711 Personal history of pulmonary embolism: Secondary | ICD-10-CM | POA: Diagnosis not present

## 2022-07-19 DIAGNOSIS — I5033 Acute on chronic diastolic (congestive) heart failure: Secondary | ICD-10-CM | POA: Diagnosis not present

## 2022-07-19 DIAGNOSIS — I110 Hypertensive heart disease with heart failure: Secondary | ICD-10-CM | POA: Diagnosis not present

## 2022-07-19 DIAGNOSIS — J449 Chronic obstructive pulmonary disease, unspecified: Secondary | ICD-10-CM | POA: Diagnosis not present

## 2022-07-19 DIAGNOSIS — E039 Hypothyroidism, unspecified: Secondary | ICD-10-CM | POA: Diagnosis not present

## 2022-07-19 DIAGNOSIS — I4892 Unspecified atrial flutter: Secondary | ICD-10-CM | POA: Diagnosis not present

## 2022-07-19 DIAGNOSIS — I480 Paroxysmal atrial fibrillation: Secondary | ICD-10-CM | POA: Diagnosis not present

## 2022-07-19 DIAGNOSIS — Z7984 Long term (current) use of oral hypoglycemic drugs: Secondary | ICD-10-CM | POA: Diagnosis not present

## 2022-07-19 DIAGNOSIS — F02818 Dementia in other diseases classified elsewhere, unspecified severity, with other behavioral disturbance: Secondary | ICD-10-CM | POA: Diagnosis not present

## 2022-07-19 DIAGNOSIS — F32A Depression, unspecified: Secondary | ICD-10-CM | POA: Diagnosis not present

## 2022-07-19 DIAGNOSIS — Z8616 Personal history of COVID-19: Secondary | ICD-10-CM | POA: Diagnosis not present

## 2022-07-19 DIAGNOSIS — M1612 Unilateral primary osteoarthritis, left hip: Secondary | ICD-10-CM | POA: Diagnosis not present

## 2022-07-19 DIAGNOSIS — T826XXA Infection and inflammatory reaction due to cardiac valve prosthesis, initial encounter: Secondary | ICD-10-CM | POA: Diagnosis not present

## 2022-07-19 DIAGNOSIS — K219 Gastro-esophageal reflux disease without esophagitis: Secondary | ICD-10-CM | POA: Diagnosis not present

## 2022-07-19 DIAGNOSIS — I5022 Chronic systolic (congestive) heart failure: Secondary | ICD-10-CM | POA: Diagnosis not present

## 2022-07-19 DIAGNOSIS — I442 Atrioventricular block, complete: Secondary | ICD-10-CM | POA: Diagnosis not present

## 2022-07-19 DIAGNOSIS — A419 Sepsis, unspecified organism: Secondary | ICD-10-CM | POA: Diagnosis not present

## 2022-07-19 DIAGNOSIS — E876 Hypokalemia: Secondary | ICD-10-CM | POA: Diagnosis not present

## 2022-07-19 DIAGNOSIS — Z556 Problems related to health literacy: Secondary | ICD-10-CM | POA: Diagnosis not present

## 2022-07-19 DIAGNOSIS — F0283 Dementia in other diseases classified elsewhere, unspecified severity, with mood disturbance: Secondary | ICD-10-CM | POA: Diagnosis not present

## 2022-07-19 DIAGNOSIS — Z7901 Long term (current) use of anticoagulants: Secondary | ICD-10-CM | POA: Diagnosis not present

## 2022-07-19 DIAGNOSIS — E1140 Type 2 diabetes mellitus with diabetic neuropathy, unspecified: Secondary | ICD-10-CM | POA: Diagnosis not present

## 2022-07-19 NOTE — Telephone Encounter (Signed)
Returned call to patient to provide number for Social Security and went over instructions as per previous notes. Pt verbalized understanding. Jim Like MHA RN CCM

## 2022-07-19 NOTE — Patient Outreach (Signed)
  Care Coordination   07/19/2022 Name: ZENJA HASSLER MRN: [id number] DOB: 18-May-1933   Care Coordination Outreach Attempts:  An unsuccessful telephone outreach was attempted for a scheduled appointment today.  Follow Up Plan:  Additional outreach attempts will be made to offer the patient care coordination information and services.   Encounter Outcome:  No Answer   Care Coordination Interventions:  No, not indicated    [name], RN, MSN, BSN, CCM [facility] Care Coordinator [phone]

## 2022-07-19 NOTE — Telephone Encounter (Signed)
[name] is Academic librarian, RN back in regards to assistance applications information. She states she was given a #, but no longer has that #. Requesting call back.

## 2022-07-20 ENCOUNTER — Telehealth: Payer: Self-pay | Admitting: Cardiovascular Disease

## 2022-07-20 DIAGNOSIS — Z86711 Personal history of pulmonary embolism: Secondary | ICD-10-CM

## 2022-07-20 DIAGNOSIS — J449 Chronic obstructive pulmonary disease, unspecified: Secondary | ICD-10-CM | POA: Diagnosis not present

## 2022-07-20 DIAGNOSIS — I358 Other nonrheumatic aortic valve disorders: Secondary | ICD-10-CM

## 2022-07-20 DIAGNOSIS — E876 Hypokalemia: Secondary | ICD-10-CM

## 2022-07-20 DIAGNOSIS — F02818 Dementia in other diseases classified elsewhere, unspecified severity, with other behavioral disturbance: Secondary | ICD-10-CM | POA: Diagnosis not present

## 2022-07-20 DIAGNOSIS — M1612 Unilateral primary osteoarthritis, left hip: Secondary | ICD-10-CM

## 2022-07-20 DIAGNOSIS — I480 Paroxysmal atrial fibrillation: Secondary | ICD-10-CM | POA: Diagnosis not present

## 2022-07-20 DIAGNOSIS — F0284 Dementia in other diseases classified elsewhere, unspecified severity, with anxiety: Secondary | ICD-10-CM | POA: Diagnosis not present

## 2022-07-20 DIAGNOSIS — F0283 Dementia in other diseases classified elsewhere, unspecified severity, with mood disturbance: Secondary | ICD-10-CM | POA: Diagnosis not present

## 2022-07-20 DIAGNOSIS — Z6839 Body mass index (BMI) 39.0-39.9, adult: Secondary | ICD-10-CM

## 2022-07-20 DIAGNOSIS — K219 Gastro-esophageal reflux disease without esophagitis: Secondary | ICD-10-CM

## 2022-07-20 DIAGNOSIS — I442 Atrioventricular block, complete: Secondary | ICD-10-CM

## 2022-07-20 DIAGNOSIS — F32A Depression, unspecified: Secondary | ICD-10-CM | POA: Diagnosis not present

## 2022-07-20 DIAGNOSIS — I5022 Chronic systolic (congestive) heart failure: Secondary | ICD-10-CM | POA: Diagnosis not present

## 2022-07-20 DIAGNOSIS — Z7901 Long term (current) use of anticoagulants: Secondary | ICD-10-CM

## 2022-07-20 DIAGNOSIS — E039 Hypothyroidism, unspecified: Secondary | ICD-10-CM | POA: Diagnosis not present

## 2022-07-20 DIAGNOSIS — T826XXA Infection and inflammatory reaction due to cardiac valve prosthesis, initial encounter: Secondary | ICD-10-CM

## 2022-07-20 DIAGNOSIS — A419 Sepsis, unspecified organism: Secondary | ICD-10-CM | POA: Diagnosis not present

## 2022-07-20 DIAGNOSIS — E1140 Type 2 diabetes mellitus with diabetic neuropathy, unspecified: Secondary | ICD-10-CM | POA: Diagnosis not present

## 2022-07-20 DIAGNOSIS — Z8616 Personal history of COVID-19: Secondary | ICD-10-CM

## 2022-07-20 DIAGNOSIS — Z7984 Long term (current) use of oral hypoglycemic drugs: Secondary | ICD-10-CM

## 2022-07-20 DIAGNOSIS — I110 Hypertensive heart disease with heart failure: Secondary | ICD-10-CM | POA: Diagnosis not present

## 2022-07-20 DIAGNOSIS — I4892 Unspecified atrial flutter: Secondary | ICD-10-CM

## 2022-07-20 DIAGNOSIS — Z556 Problems related to health literacy: Secondary | ICD-10-CM

## 2022-07-20 DIAGNOSIS — I5033 Acute on chronic diastolic (congestive) heart failure: Secondary | ICD-10-CM | POA: Diagnosis not present

## 2022-07-20 DIAGNOSIS — Z950 Presence of cardiac pacemaker: Secondary | ICD-10-CM

## 2022-07-20 NOTE — Telephone Encounter (Signed)
Patient's niece returned call to get to social security number to call for low income subsidy, she knows to get a rejection letter if they do not approve her.    Jardiance/ Eliquis- 3 month supply is 141 a piece. Patient does not want to pay this. Advised to use the sample we gave her first and work on Low income subsidy program and keep [place] updated.

## 2022-07-20 NOTE — Telephone Encounter (Signed)
Left message for patient to call back  

## 2022-07-20 NOTE — Telephone Encounter (Signed)
[name] is calling in regards to the patient assistance for the Jardiance medication. [name] is requesting that we call her and not the patient. Please advise

## 2022-07-21 ENCOUNTER — Telehealth: Payer: Self-pay | Admitting: Internal Medicine

## 2022-07-21 NOTE — Telephone Encounter (Signed)
[place] [employer]  Had visit with patient - patient complained about some dizziness - also weakness - patient is currently receiving PT for this   BP:  108/59 - manually and then 106/59  Pulse rate:  78  Temp.  97.1  Respirations: 16

## 2022-07-24 DIAGNOSIS — E1140 Type 2 diabetes mellitus with diabetic neuropathy, unspecified: Secondary | ICD-10-CM | POA: Diagnosis not present

## 2022-07-24 DIAGNOSIS — T826XXA Infection and inflammatory reaction due to cardiac valve prosthesis, initial encounter: Secondary | ICD-10-CM | POA: Diagnosis not present

## 2022-07-24 DIAGNOSIS — E876 Hypokalemia: Secondary | ICD-10-CM | POA: Diagnosis not present

## 2022-07-24 DIAGNOSIS — F0283 Dementia in other diseases classified elsewhere, unspecified severity, with mood disturbance: Secondary | ICD-10-CM | POA: Diagnosis not present

## 2022-07-24 DIAGNOSIS — I358 Other nonrheumatic aortic valve disorders: Secondary | ICD-10-CM | POA: Diagnosis not present

## 2022-07-24 DIAGNOSIS — I4892 Unspecified atrial flutter: Secondary | ICD-10-CM | POA: Diagnosis not present

## 2022-07-24 DIAGNOSIS — Z556 Problems related to health literacy: Secondary | ICD-10-CM | POA: Diagnosis not present

## 2022-07-24 DIAGNOSIS — I110 Hypertensive heart disease with heart failure: Secondary | ICD-10-CM | POA: Diagnosis not present

## 2022-07-24 DIAGNOSIS — F0284 Dementia in other diseases classified elsewhere, unspecified severity, with anxiety: Secondary | ICD-10-CM | POA: Diagnosis not present

## 2022-07-24 DIAGNOSIS — Z7984 Long term (current) use of oral hypoglycemic drugs: Secondary | ICD-10-CM | POA: Diagnosis not present

## 2022-07-24 DIAGNOSIS — F02818 Dementia in other diseases classified elsewhere, unspecified severity, with other behavioral disturbance: Secondary | ICD-10-CM | POA: Diagnosis not present

## 2022-07-24 DIAGNOSIS — I5033 Acute on chronic diastolic (congestive) heart failure: Secondary | ICD-10-CM | POA: Diagnosis not present

## 2022-07-24 DIAGNOSIS — Z8616 Personal history of COVID-19: Secondary | ICD-10-CM | POA: Diagnosis not present

## 2022-07-24 DIAGNOSIS — M1612 Unilateral primary osteoarthritis, left hip: Secondary | ICD-10-CM | POA: Diagnosis not present

## 2022-07-24 DIAGNOSIS — I442 Atrioventricular block, complete: Secondary | ICD-10-CM | POA: Diagnosis not present

## 2022-07-24 DIAGNOSIS — Z7901 Long term (current) use of anticoagulants: Secondary | ICD-10-CM | POA: Diagnosis not present

## 2022-07-24 DIAGNOSIS — I480 Paroxysmal atrial fibrillation: Secondary | ICD-10-CM | POA: Diagnosis not present

## 2022-07-24 DIAGNOSIS — F32A Depression, unspecified: Secondary | ICD-10-CM | POA: Diagnosis not present

## 2022-07-24 DIAGNOSIS — A419 Sepsis, unspecified organism: Secondary | ICD-10-CM | POA: Diagnosis not present

## 2022-07-24 DIAGNOSIS — I5022 Chronic systolic (congestive) heart failure: Secondary | ICD-10-CM | POA: Diagnosis not present

## 2022-07-24 DIAGNOSIS — Z86711 Personal history of pulmonary embolism: Secondary | ICD-10-CM | POA: Diagnosis not present

## 2022-07-24 DIAGNOSIS — E039 Hypothyroidism, unspecified: Secondary | ICD-10-CM | POA: Diagnosis not present

## 2022-07-24 DIAGNOSIS — J449 Chronic obstructive pulmonary disease, unspecified: Secondary | ICD-10-CM | POA: Diagnosis not present

## 2022-07-24 DIAGNOSIS — K219 Gastro-esophageal reflux disease without esophagitis: Secondary | ICD-10-CM | POA: Diagnosis not present

## 2022-07-25 ENCOUNTER — Telehealth (HOSPITAL_BASED_OUTPATIENT_CLINIC_OR_DEPARTMENT_OTHER): Payer: Self-pay

## 2022-07-25 DIAGNOSIS — E876 Hypokalemia: Secondary | ICD-10-CM | POA: Diagnosis not present

## 2022-07-25 DIAGNOSIS — Z7984 Long term (current) use of oral hypoglycemic drugs: Secondary | ICD-10-CM | POA: Diagnosis not present

## 2022-07-25 DIAGNOSIS — T826XXA Infection and inflammatory reaction due to cardiac valve prosthesis, initial encounter: Secondary | ICD-10-CM | POA: Diagnosis not present

## 2022-07-25 DIAGNOSIS — M1612 Unilateral primary osteoarthritis, left hip: Secondary | ICD-10-CM | POA: Diagnosis not present

## 2022-07-25 DIAGNOSIS — I5022 Chronic systolic (congestive) heart failure: Secondary | ICD-10-CM | POA: Diagnosis not present

## 2022-07-25 DIAGNOSIS — K219 Gastro-esophageal reflux disease without esophagitis: Secondary | ICD-10-CM | POA: Diagnosis not present

## 2022-07-25 DIAGNOSIS — I358 Other nonrheumatic aortic valve disorders: Secondary | ICD-10-CM | POA: Diagnosis not present

## 2022-07-25 DIAGNOSIS — Z8616 Personal history of COVID-19: Secondary | ICD-10-CM | POA: Diagnosis not present

## 2022-07-25 DIAGNOSIS — I480 Paroxysmal atrial fibrillation: Secondary | ICD-10-CM | POA: Diagnosis not present

## 2022-07-25 DIAGNOSIS — Z86711 Personal history of pulmonary embolism: Secondary | ICD-10-CM | POA: Diagnosis not present

## 2022-07-25 DIAGNOSIS — Z556 Problems related to health literacy: Secondary | ICD-10-CM | POA: Diagnosis not present

## 2022-07-25 DIAGNOSIS — F0283 Dementia in other diseases classified elsewhere, unspecified severity, with mood disturbance: Secondary | ICD-10-CM | POA: Diagnosis not present

## 2022-07-25 DIAGNOSIS — A419 Sepsis, unspecified organism: Secondary | ICD-10-CM | POA: Diagnosis not present

## 2022-07-25 DIAGNOSIS — Z7901 Long term (current) use of anticoagulants: Secondary | ICD-10-CM | POA: Diagnosis not present

## 2022-07-25 DIAGNOSIS — J449 Chronic obstructive pulmonary disease, unspecified: Secondary | ICD-10-CM | POA: Diagnosis not present

## 2022-07-25 DIAGNOSIS — I5033 Acute on chronic diastolic (congestive) heart failure: Secondary | ICD-10-CM | POA: Diagnosis not present

## 2022-07-25 DIAGNOSIS — I110 Hypertensive heart disease with heart failure: Secondary | ICD-10-CM | POA: Diagnosis not present

## 2022-07-25 DIAGNOSIS — F32A Depression, unspecified: Secondary | ICD-10-CM | POA: Diagnosis not present

## 2022-07-25 DIAGNOSIS — F02818 Dementia in other diseases classified elsewhere, unspecified severity, with other behavioral disturbance: Secondary | ICD-10-CM | POA: Diagnosis not present

## 2022-07-25 DIAGNOSIS — E039 Hypothyroidism, unspecified: Secondary | ICD-10-CM | POA: Diagnosis not present

## 2022-07-25 DIAGNOSIS — I4892 Unspecified atrial flutter: Secondary | ICD-10-CM | POA: Diagnosis not present

## 2022-07-25 DIAGNOSIS — I5032 Chronic diastolic (congestive) heart failure: Secondary | ICD-10-CM

## 2022-07-25 DIAGNOSIS — E1140 Type 2 diabetes mellitus with diabetic neuropathy, unspecified: Secondary | ICD-10-CM | POA: Diagnosis not present

## 2022-07-25 DIAGNOSIS — F0284 Dementia in other diseases classified elsewhere, unspecified severity, with anxiety: Secondary | ICD-10-CM | POA: Diagnosis not present

## 2022-07-25 DIAGNOSIS — I442 Atrioventricular block, complete: Secondary | ICD-10-CM | POA: Diagnosis not present

## 2022-07-25 LAB — BASIC METABOLIC PANEL
BUN/Creatinine Ratio: 22 (ref 12–28)
BUN: 20 mg/dL (ref 8–27)
CO2: 22 mmol/L (ref 20–29)
Calcium: 9.7 mg/dL (ref 8.7–10.3)
Chloride: 99 mmol/L (ref 96–106)
Creatinine, Ser: 0.93 mg/dL (ref 0.57–1.00)
Glucose: 161 mg/dL — ABNORMAL HIGH (ref 70–99)
Potassium: 5 mmol/L (ref 3.5–5.2)
Sodium: 138 mmol/L (ref 134–144)
eGFR: 60 mL/min/{1.73_m2} (ref >59)

## 2022-07-25 LAB — CBC
Hematocrit: 35.4 % (ref 34.0–46.6)
Hemoglobin: 11.7 g/dL (ref 11.1–15.9)
MCH: 30.2 pg (ref 26.6–33.0)
MCHC: 33.1 g/dL (ref 31.5–35.7)
MCV: 92 fL (ref 79–97)
Platelets: 244 10*3/uL (ref 150–450)
RBC: 3.87 x10E6/uL (ref 3.77–5.28)
RDW: 12.9 % (ref 11.7–15.4)
WBC: 6.7 10*3/uL (ref 3.4–10.8)

## 2022-07-25 LAB — BRAIN NATRIURETIC PEPTIDE

## 2022-07-25 MED ORDER — FUROSEMIDE 40 MG PO TABS: ORAL_TABLET | ORAL | 3 refills | Status: DC

## 2022-07-25 MED ORDER — SPIRONOLACTONE 25 MG PO TABS
12.5000 mg | ORAL_TABLET | Freq: Every day | ORAL | 3 refills | Status: DC
Start: 2022-07-25 — End: 2023-09-03

## 2022-07-25 NOTE — Telephone Encounter (Addendum)
Left message for patient to call back     ----- Message from Alver Sorrow, NP sent at 07/25/2022 12:03 PM EDT ----- CBC with no evidence of anemia nor infection.  Kidney function improved. Normal electrolytes.   BNP unable to be run due to insufficient sample.  Recommend adjustment of diuretics for optimization of GDMT.  Rx lasix  QD with additional tablet PRN for weight gain of 2 lbs overnight or 5 lbs in one week (presently taking  QD).   Rx Spironolactone 12.5mg  QD ( presently not taking )   Update BMP in 1 week.

## 2022-07-25 NOTE — Addendum Note (Signed)
Addended by: Marlene Lard on: 07/25/2022 01:40 PM   Modules accepted: Orders

## 2022-07-25 NOTE — Telephone Encounter (Signed)
Patient's niece returned call to the office, the following recommendations were reviewed and Jessica [name] understanding! Rx to pharmacy and labs mailed to patient.          ----- Message from Alver Sorrow, NP sent at 07/25/2022 12:03 PM EDT ----- CBC with no evidence of anemia nor infection.  Kidney function improved. Normal electrolytes.    BNP unable to be run due to insufficient sample.   Recommend adjustment of diuretics for optimization of GDMT.  Rx lasix  QD with additional tablet PRN for weight gain of 2 lbs overnight or 5 lbs in one week (presently taking  QD).    Rx Spironolactone 12.5mg  QD ( presently not taking )    Update BMP in 1 week.

## 2022-07-26 DIAGNOSIS — F32A Depression, unspecified: Secondary | ICD-10-CM | POA: Diagnosis not present

## 2022-07-26 DIAGNOSIS — Z8616 Personal history of COVID-19: Secondary | ICD-10-CM | POA: Diagnosis not present

## 2022-07-26 DIAGNOSIS — I5022 Chronic systolic (congestive) heart failure: Secondary | ICD-10-CM | POA: Diagnosis not present

## 2022-07-26 DIAGNOSIS — E039 Hypothyroidism, unspecified: Secondary | ICD-10-CM | POA: Diagnosis not present

## 2022-07-26 DIAGNOSIS — Z7901 Long term (current) use of anticoagulants: Secondary | ICD-10-CM | POA: Diagnosis not present

## 2022-07-26 DIAGNOSIS — M1612 Unilateral primary osteoarthritis, left hip: Secondary | ICD-10-CM | POA: Diagnosis not present

## 2022-07-26 DIAGNOSIS — F0284 Dementia in other diseases classified elsewhere, unspecified severity, with anxiety: Secondary | ICD-10-CM | POA: Diagnosis not present

## 2022-07-26 DIAGNOSIS — I358 Other nonrheumatic aortic valve disorders: Secondary | ICD-10-CM | POA: Diagnosis not present

## 2022-07-26 DIAGNOSIS — Z556 Problems related to health literacy: Secondary | ICD-10-CM | POA: Diagnosis not present

## 2022-07-26 DIAGNOSIS — Z7984 Long term (current) use of oral hypoglycemic drugs: Secondary | ICD-10-CM | POA: Diagnosis not present

## 2022-07-26 DIAGNOSIS — Z86711 Personal history of pulmonary embolism: Secondary | ICD-10-CM | POA: Diagnosis not present

## 2022-07-26 DIAGNOSIS — I480 Paroxysmal atrial fibrillation: Secondary | ICD-10-CM | POA: Diagnosis not present

## 2022-07-26 DIAGNOSIS — E876 Hypokalemia: Secondary | ICD-10-CM | POA: Diagnosis not present

## 2022-07-26 DIAGNOSIS — E1140 Type 2 diabetes mellitus with diabetic neuropathy, unspecified: Secondary | ICD-10-CM | POA: Diagnosis not present

## 2022-07-26 DIAGNOSIS — I4892 Unspecified atrial flutter: Secondary | ICD-10-CM | POA: Diagnosis not present

## 2022-07-26 DIAGNOSIS — I110 Hypertensive heart disease with heart failure: Secondary | ICD-10-CM | POA: Diagnosis not present

## 2022-07-26 DIAGNOSIS — K219 Gastro-esophageal reflux disease without esophagitis: Secondary | ICD-10-CM | POA: Diagnosis not present

## 2022-07-26 DIAGNOSIS — F02818 Dementia in other diseases classified elsewhere, unspecified severity, with other behavioral disturbance: Secondary | ICD-10-CM | POA: Diagnosis not present

## 2022-07-26 DIAGNOSIS — I442 Atrioventricular block, complete: Secondary | ICD-10-CM | POA: Diagnosis not present

## 2022-07-26 DIAGNOSIS — A419 Sepsis, unspecified organism: Secondary | ICD-10-CM | POA: Diagnosis not present

## 2022-07-26 DIAGNOSIS — I5033 Acute on chronic diastolic (congestive) heart failure: Secondary | ICD-10-CM | POA: Diagnosis not present

## 2022-07-26 DIAGNOSIS — T826XXA Infection and inflammatory reaction due to cardiac valve prosthesis, initial encounter: Secondary | ICD-10-CM | POA: Diagnosis not present

## 2022-07-26 DIAGNOSIS — F0283 Dementia in other diseases classified elsewhere, unspecified severity, with mood disturbance: Secondary | ICD-10-CM | POA: Diagnosis not present

## 2022-07-26 DIAGNOSIS — J449 Chronic obstructive pulmonary disease, unspecified: Secondary | ICD-10-CM | POA: Diagnosis not present

## 2022-07-26 NOTE — Telephone Encounter (Signed)
Niece is calling back asking that the nurse give her a call back. Please advise

## 2022-07-26 NOTE — Telephone Encounter (Signed)
Spoke with niece and gave her number for [facility] [phone] ext. [phone] to assist with submitted.

## 2022-07-27 ENCOUNTER — Other Ambulatory Visit: Payer: Self-pay | Admitting: Internal Medicine

## 2022-07-27 DIAGNOSIS — K219 Gastro-esophageal reflux disease without esophagitis: Secondary | ICD-10-CM | POA: Diagnosis not present

## 2022-07-27 DIAGNOSIS — Z7901 Long term (current) use of anticoagulants: Secondary | ICD-10-CM | POA: Diagnosis not present

## 2022-07-27 DIAGNOSIS — T826XXA Infection and inflammatory reaction due to cardiac valve prosthesis, initial encounter: Secondary | ICD-10-CM | POA: Diagnosis not present

## 2022-07-27 DIAGNOSIS — F32A Depression, unspecified: Secondary | ICD-10-CM | POA: Diagnosis not present

## 2022-07-27 DIAGNOSIS — Z7984 Long term (current) use of oral hypoglycemic drugs: Secondary | ICD-10-CM | POA: Diagnosis not present

## 2022-07-27 DIAGNOSIS — M1612 Unilateral primary osteoarthritis, left hip: Secondary | ICD-10-CM | POA: Diagnosis not present

## 2022-07-27 DIAGNOSIS — J449 Chronic obstructive pulmonary disease, unspecified: Secondary | ICD-10-CM | POA: Diagnosis not present

## 2022-07-27 DIAGNOSIS — Z556 Problems related to health literacy: Secondary | ICD-10-CM | POA: Diagnosis not present

## 2022-07-27 DIAGNOSIS — Z86711 Personal history of pulmonary embolism: Secondary | ICD-10-CM | POA: Diagnosis not present

## 2022-07-27 DIAGNOSIS — E039 Hypothyroidism, unspecified: Secondary | ICD-10-CM | POA: Diagnosis not present

## 2022-07-27 DIAGNOSIS — F0283 Dementia in other diseases classified elsewhere, unspecified severity, with mood disturbance: Secondary | ICD-10-CM | POA: Diagnosis not present

## 2022-07-27 DIAGNOSIS — I480 Paroxysmal atrial fibrillation: Secondary | ICD-10-CM | POA: Diagnosis not present

## 2022-07-27 DIAGNOSIS — F02818 Dementia in other diseases classified elsewhere, unspecified severity, with other behavioral disturbance: Secondary | ICD-10-CM | POA: Diagnosis not present

## 2022-07-27 DIAGNOSIS — F0284 Dementia in other diseases classified elsewhere, unspecified severity, with anxiety: Secondary | ICD-10-CM | POA: Diagnosis not present

## 2022-07-27 DIAGNOSIS — E1140 Type 2 diabetes mellitus with diabetic neuropathy, unspecified: Secondary | ICD-10-CM | POA: Diagnosis not present

## 2022-07-27 DIAGNOSIS — I4892 Unspecified atrial flutter: Secondary | ICD-10-CM | POA: Diagnosis not present

## 2022-07-27 DIAGNOSIS — Z8616 Personal history of COVID-19: Secondary | ICD-10-CM | POA: Diagnosis not present

## 2022-07-27 DIAGNOSIS — I110 Hypertensive heart disease with heart failure: Secondary | ICD-10-CM | POA: Diagnosis not present

## 2022-07-27 DIAGNOSIS — I442 Atrioventricular block, complete: Secondary | ICD-10-CM | POA: Diagnosis not present

## 2022-07-27 DIAGNOSIS — I5022 Chronic systolic (congestive) heart failure: Secondary | ICD-10-CM | POA: Diagnosis not present

## 2022-07-27 DIAGNOSIS — I5033 Acute on chronic diastolic (congestive) heart failure: Secondary | ICD-10-CM | POA: Diagnosis not present

## 2022-07-27 DIAGNOSIS — E876 Hypokalemia: Secondary | ICD-10-CM | POA: Diagnosis not present

## 2022-07-27 DIAGNOSIS — I358 Other nonrheumatic aortic valve disorders: Secondary | ICD-10-CM | POA: Diagnosis not present

## 2022-07-27 DIAGNOSIS — A419 Sepsis, unspecified organism: Secondary | ICD-10-CM | POA: Diagnosis not present

## 2022-07-31 DIAGNOSIS — I442 Atrioventricular block, complete: Secondary | ICD-10-CM | POA: Diagnosis not present

## 2022-07-31 DIAGNOSIS — I480 Paroxysmal atrial fibrillation: Secondary | ICD-10-CM | POA: Diagnosis not present

## 2022-07-31 DIAGNOSIS — K219 Gastro-esophageal reflux disease without esophagitis: Secondary | ICD-10-CM | POA: Diagnosis not present

## 2022-07-31 DIAGNOSIS — Z8616 Personal history of COVID-19: Secondary | ICD-10-CM | POA: Diagnosis not present

## 2022-07-31 DIAGNOSIS — M1612 Unilateral primary osteoarthritis, left hip: Secondary | ICD-10-CM | POA: Diagnosis not present

## 2022-07-31 DIAGNOSIS — J449 Chronic obstructive pulmonary disease, unspecified: Secondary | ICD-10-CM | POA: Diagnosis not present

## 2022-07-31 DIAGNOSIS — Z556 Problems related to health literacy: Secondary | ICD-10-CM | POA: Diagnosis not present

## 2022-07-31 DIAGNOSIS — Z7984 Long term (current) use of oral hypoglycemic drugs: Secondary | ICD-10-CM | POA: Diagnosis not present

## 2022-07-31 DIAGNOSIS — T826XXA Infection and inflammatory reaction due to cardiac valve prosthesis, initial encounter: Secondary | ICD-10-CM | POA: Diagnosis not present

## 2022-07-31 DIAGNOSIS — I110 Hypertensive heart disease with heart failure: Secondary | ICD-10-CM | POA: Diagnosis not present

## 2022-07-31 DIAGNOSIS — F02818 Dementia in other diseases classified elsewhere, unspecified severity, with other behavioral disturbance: Secondary | ICD-10-CM | POA: Diagnosis not present

## 2022-07-31 DIAGNOSIS — I4892 Unspecified atrial flutter: Secondary | ICD-10-CM | POA: Diagnosis not present

## 2022-07-31 DIAGNOSIS — A419 Sepsis, unspecified organism: Secondary | ICD-10-CM | POA: Diagnosis not present

## 2022-07-31 DIAGNOSIS — E039 Hypothyroidism, unspecified: Secondary | ICD-10-CM | POA: Diagnosis not present

## 2022-07-31 DIAGNOSIS — E876 Hypokalemia: Secondary | ICD-10-CM | POA: Diagnosis not present

## 2022-07-31 DIAGNOSIS — I358 Other nonrheumatic aortic valve disorders: Secondary | ICD-10-CM | POA: Diagnosis not present

## 2022-07-31 DIAGNOSIS — F0283 Dementia in other diseases classified elsewhere, unspecified severity, with mood disturbance: Secondary | ICD-10-CM | POA: Diagnosis not present

## 2022-07-31 DIAGNOSIS — F32A Depression, unspecified: Secondary | ICD-10-CM | POA: Diagnosis not present

## 2022-07-31 DIAGNOSIS — I5022 Chronic systolic (congestive) heart failure: Secondary | ICD-10-CM | POA: Diagnosis not present

## 2022-07-31 DIAGNOSIS — I5033 Acute on chronic diastolic (congestive) heart failure: Secondary | ICD-10-CM | POA: Diagnosis not present

## 2022-07-31 DIAGNOSIS — Z86711 Personal history of pulmonary embolism: Secondary | ICD-10-CM | POA: Diagnosis not present

## 2022-07-31 DIAGNOSIS — Z7901 Long term (current) use of anticoagulants: Secondary | ICD-10-CM | POA: Diagnosis not present

## 2022-07-31 DIAGNOSIS — F0284 Dementia in other diseases classified elsewhere, unspecified severity, with anxiety: Secondary | ICD-10-CM | POA: Diagnosis not present

## 2022-07-31 DIAGNOSIS — E1140 Type 2 diabetes mellitus with diabetic neuropathy, unspecified: Secondary | ICD-10-CM | POA: Diagnosis not present

## 2022-08-01 ENCOUNTER — Other Ambulatory Visit: Payer: Self-pay | Admitting: Internal Medicine

## 2022-08-01 DIAGNOSIS — E1140 Type 2 diabetes mellitus with diabetic neuropathy, unspecified: Secondary | ICD-10-CM | POA: Diagnosis not present

## 2022-08-01 DIAGNOSIS — F32A Depression, unspecified: Secondary | ICD-10-CM | POA: Diagnosis not present

## 2022-08-01 DIAGNOSIS — Z8616 Personal history of COVID-19: Secondary | ICD-10-CM | POA: Diagnosis not present

## 2022-08-01 DIAGNOSIS — Z556 Problems related to health literacy: Secondary | ICD-10-CM | POA: Diagnosis not present

## 2022-08-01 DIAGNOSIS — F0283 Dementia in other diseases classified elsewhere, unspecified severity, with mood disturbance: Secondary | ICD-10-CM | POA: Diagnosis not present

## 2022-08-01 DIAGNOSIS — I358 Other nonrheumatic aortic valve disorders: Secondary | ICD-10-CM | POA: Diagnosis not present

## 2022-08-01 DIAGNOSIS — I480 Paroxysmal atrial fibrillation: Secondary | ICD-10-CM | POA: Diagnosis not present

## 2022-08-01 DIAGNOSIS — K219 Gastro-esophageal reflux disease without esophagitis: Secondary | ICD-10-CM | POA: Diagnosis not present

## 2022-08-01 DIAGNOSIS — I5033 Acute on chronic diastolic (congestive) heart failure: Secondary | ICD-10-CM | POA: Diagnosis not present

## 2022-08-01 DIAGNOSIS — Z7984 Long term (current) use of oral hypoglycemic drugs: Secondary | ICD-10-CM | POA: Diagnosis not present

## 2022-08-01 DIAGNOSIS — F0284 Dementia in other diseases classified elsewhere, unspecified severity, with anxiety: Secondary | ICD-10-CM | POA: Diagnosis not present

## 2022-08-01 DIAGNOSIS — E039 Hypothyroidism, unspecified: Secondary | ICD-10-CM | POA: Diagnosis not present

## 2022-08-01 DIAGNOSIS — A419 Sepsis, unspecified organism: Secondary | ICD-10-CM | POA: Diagnosis not present

## 2022-08-01 DIAGNOSIS — I442 Atrioventricular block, complete: Secondary | ICD-10-CM | POA: Diagnosis not present

## 2022-08-01 DIAGNOSIS — Z86711 Personal history of pulmonary embolism: Secondary | ICD-10-CM | POA: Diagnosis not present

## 2022-08-01 DIAGNOSIS — Z7901 Long term (current) use of anticoagulants: Secondary | ICD-10-CM | POA: Diagnosis not present

## 2022-08-01 DIAGNOSIS — I5022 Chronic systolic (congestive) heart failure: Secondary | ICD-10-CM | POA: Diagnosis not present

## 2022-08-01 DIAGNOSIS — I110 Hypertensive heart disease with heart failure: Secondary | ICD-10-CM | POA: Diagnosis not present

## 2022-08-01 DIAGNOSIS — J449 Chronic obstructive pulmonary disease, unspecified: Secondary | ICD-10-CM | POA: Diagnosis not present

## 2022-08-01 DIAGNOSIS — E876 Hypokalemia: Secondary | ICD-10-CM | POA: Diagnosis not present

## 2022-08-01 DIAGNOSIS — M1612 Unilateral primary osteoarthritis, left hip: Secondary | ICD-10-CM | POA: Diagnosis not present

## 2022-08-01 DIAGNOSIS — F02818 Dementia in other diseases classified elsewhere, unspecified severity, with other behavioral disturbance: Secondary | ICD-10-CM | POA: Diagnosis not present

## 2022-08-01 DIAGNOSIS — I4892 Unspecified atrial flutter: Secondary | ICD-10-CM | POA: Diagnosis not present

## 2022-08-01 DIAGNOSIS — T826XXA Infection and inflammatory reaction due to cardiac valve prosthesis, initial encounter: Secondary | ICD-10-CM | POA: Diagnosis not present

## 2022-08-03 ENCOUNTER — Ambulatory Visit: Payer: Self-pay

## 2022-08-03 DIAGNOSIS — E1140 Type 2 diabetes mellitus with diabetic neuropathy, unspecified: Secondary | ICD-10-CM | POA: Diagnosis not present

## 2022-08-03 DIAGNOSIS — Z7901 Long term (current) use of anticoagulants: Secondary | ICD-10-CM | POA: Diagnosis not present

## 2022-08-03 DIAGNOSIS — F0284 Dementia in other diseases classified elsewhere, unspecified severity, with anxiety: Secondary | ICD-10-CM | POA: Diagnosis not present

## 2022-08-03 DIAGNOSIS — I480 Paroxysmal atrial fibrillation: Secondary | ICD-10-CM | POA: Diagnosis not present

## 2022-08-03 DIAGNOSIS — T826XXA Infection and inflammatory reaction due to cardiac valve prosthesis, initial encounter: Secondary | ICD-10-CM | POA: Diagnosis not present

## 2022-08-03 DIAGNOSIS — J449 Chronic obstructive pulmonary disease, unspecified: Secondary | ICD-10-CM | POA: Diagnosis not present

## 2022-08-03 DIAGNOSIS — Z7984 Long term (current) use of oral hypoglycemic drugs: Secondary | ICD-10-CM | POA: Diagnosis not present

## 2022-08-03 DIAGNOSIS — Z556 Problems related to health literacy: Secondary | ICD-10-CM | POA: Diagnosis not present

## 2022-08-03 DIAGNOSIS — I358 Other nonrheumatic aortic valve disorders: Secondary | ICD-10-CM | POA: Diagnosis not present

## 2022-08-03 DIAGNOSIS — M1612 Unilateral primary osteoarthritis, left hip: Secondary | ICD-10-CM | POA: Diagnosis not present

## 2022-08-03 DIAGNOSIS — Z8616 Personal history of COVID-19: Secondary | ICD-10-CM | POA: Diagnosis not present

## 2022-08-03 DIAGNOSIS — I5022 Chronic systolic (congestive) heart failure: Secondary | ICD-10-CM | POA: Diagnosis not present

## 2022-08-03 DIAGNOSIS — I4892 Unspecified atrial flutter: Secondary | ICD-10-CM | POA: Diagnosis not present

## 2022-08-03 DIAGNOSIS — Z86711 Personal history of pulmonary embolism: Secondary | ICD-10-CM | POA: Diagnosis not present

## 2022-08-03 DIAGNOSIS — I110 Hypertensive heart disease with heart failure: Secondary | ICD-10-CM | POA: Diagnosis not present

## 2022-08-03 DIAGNOSIS — E039 Hypothyroidism, unspecified: Secondary | ICD-10-CM | POA: Diagnosis not present

## 2022-08-03 DIAGNOSIS — K219 Gastro-esophageal reflux disease without esophagitis: Secondary | ICD-10-CM | POA: Diagnosis not present

## 2022-08-03 DIAGNOSIS — A419 Sepsis, unspecified organism: Secondary | ICD-10-CM | POA: Diagnosis not present

## 2022-08-03 DIAGNOSIS — F0283 Dementia in other diseases classified elsewhere, unspecified severity, with mood disturbance: Secondary | ICD-10-CM | POA: Diagnosis not present

## 2022-08-03 DIAGNOSIS — I442 Atrioventricular block, complete: Secondary | ICD-10-CM | POA: Diagnosis not present

## 2022-08-03 DIAGNOSIS — F32A Depression, unspecified: Secondary | ICD-10-CM | POA: Diagnosis not present

## 2022-08-03 DIAGNOSIS — E876 Hypokalemia: Secondary | ICD-10-CM | POA: Diagnosis not present

## 2022-08-03 DIAGNOSIS — I5033 Acute on chronic diastolic (congestive) heart failure: Secondary | ICD-10-CM | POA: Diagnosis not present

## 2022-08-03 DIAGNOSIS — F02818 Dementia in other diseases classified elsewhere, unspecified severity, with other behavioral disturbance: Secondary | ICD-10-CM | POA: Diagnosis not present

## 2022-08-03 NOTE — Patient Outreach (Signed)
  Care Coordination   Follow Up Visit Note   08/03/2022 Name: Jessica [name] MRN: [id number] DOB: 09-27-1933  Jessica [name] is a 89 y.o. year old female who sees Plotnikov, Georgina Quint, MD for primary care. I spoke with  Jessica [name] by phone today.  What matters to the patients health and wellness today?  Jessica [name] reports she feels that her memory is improving. She states she is taking medication all her medications as prescribed including: furosemide and spironolactone.  She states her niece is helping her with applying for medications assistance and request communication with her niece regarding medication assistance.   Goals Addressed             This Visit's Progress    Care Coordination-assist with health management       Interventions Today    Flowsheet Row Most Recent Value  Chronic Disease   Chronic disease during today's visit Congestive Heart Failure (CHF)  General Interventions   General Interventions Discussed/Reviewed General Interventions Reviewed, Doctor Visits  [patient encouraged to update DPR list.]  Doctor Visits Discussed/Reviewed Doctor Visits Discussed, Doctor Visits Reviewed  Education Interventions   Education Provided Provided Education  [discussed daily weights and importance of weighing and recording weights. problem solve strategies to remember to weigh ie location of scale or placing a reminder note in an area of frequent use such as kitchen.]  Provided Verbal Education On Other  [provided education on importance of weighing self daily, placement of scale, reviewed instructions per cardiology visit completed on 07/14/22]  Nutrition Interventions   Nutrition Discussed/Reviewed Nutrition Reviewed, Decreasing salt  Pharmacy Interventions   Pharmacy Dicussed/Reviewed Medication Adherence, Medications and their functions, Affording Medications, Referral to Pharmacist, Pharmacy Topics Reviewed  [communicate with Penne Lash, CMA to contact patient's  neice to follow up on medication assistance needs]  Referral to Pharmacist Cannot afford medications  Safety Interventions   Safety Discussed/Reviewed Safety Discussed, Fall Risk            SDOH assessments and interventions completed:  No  Care Coordination Interventions:  Yes, provided   Follow up plan: Follow up call scheduled for 08/24/22    Encounter Outcome:  Pt. Visit Completed   [name], RN, MSN, BSN, CCM [facility] Care Coordinator [phone]

## 2022-08-03 NOTE — Patient Instructions (Addendum)
Visit Information  Thank you for taking time to visit with me today. Please don't hesitate to contact me if I can be of assistance to you.   Following are the goals we discussed today:   Goals Addressed             This Visit's Progress    Care Coordination-assist with health management       Interventions Today    Flowsheet Row Most Recent Value  Chronic Disease   Chronic disease during today's visit Congestive Heart Failure (CHF)  General Interventions   General Interventions Discussed/Reviewed General Interventions Reviewed, Doctor Visits  [patient encouraged to update DPR list.]  Doctor Visits Discussed/Reviewed Doctor Visits Discussed, Doctor Visits Reviewed  Education Interventions   Education Provided Provided Education  [discussed daily weights and importance of weighing and recording weights. problem solve strategies to remember to weigh ie location of scale or placing a reminder note in an area of frequent use such as kitchen.]  Provided Verbal Education On Other  [provided education on importance of weighing self daily, placement of scale, reviewed instructions per cardiology visit completed on 07/14/22]  Nutrition Interventions   Nutrition Discussed/Reviewed Nutrition Reviewed, Decreasing salt  Pharmacy Interventions   Pharmacy Dicussed/Reviewed Medication Adherence, Medications and their functions, Affording Medications, Referral to Pharmacist, Pharmacy Topics Reviewed  [communicate with Penne Lash, CMA to contact patient's neice to follow up on medication assistance needs]  Referral to Pharmacist Cannot afford medications  Safety Interventions   Safety Discussed/Reviewed Safety Discussed, Fall Risk            Our next appointment is by telephone on 08/24/22 at 10:30  Please call the care guide team at [phone] if you need to cancel or reschedule your appointment.   If you are experiencing a Mental Health or Behavioral Health Crisis or need someone to talk to,  please call the Suicide and Crisis Lifeline: 41  [name], RN, MSN, BSN, CCM [facility] Care Coordinator [phone]

## 2022-08-07 ENCOUNTER — Telehealth: Payer: Self-pay

## 2022-08-07 ENCOUNTER — Other Ambulatory Visit: Payer: Self-pay | Admitting: Internal Medicine

## 2022-08-07 DIAGNOSIS — Z556 Problems related to health literacy: Secondary | ICD-10-CM | POA: Diagnosis not present

## 2022-08-07 DIAGNOSIS — I480 Paroxysmal atrial fibrillation: Secondary | ICD-10-CM | POA: Diagnosis not present

## 2022-08-07 DIAGNOSIS — I5033 Acute on chronic diastolic (congestive) heart failure: Secondary | ICD-10-CM | POA: Diagnosis not present

## 2022-08-07 DIAGNOSIS — Z8616 Personal history of COVID-19: Secondary | ICD-10-CM | POA: Diagnosis not present

## 2022-08-07 DIAGNOSIS — E1140 Type 2 diabetes mellitus with diabetic neuropathy, unspecified: Secondary | ICD-10-CM | POA: Diagnosis not present

## 2022-08-07 DIAGNOSIS — E876 Hypokalemia: Secondary | ICD-10-CM | POA: Diagnosis not present

## 2022-08-07 DIAGNOSIS — Z7984 Long term (current) use of oral hypoglycemic drugs: Secondary | ICD-10-CM | POA: Diagnosis not present

## 2022-08-07 DIAGNOSIS — K219 Gastro-esophageal reflux disease without esophagitis: Secondary | ICD-10-CM | POA: Diagnosis not present

## 2022-08-07 DIAGNOSIS — M1612 Unilateral primary osteoarthritis, left hip: Secondary | ICD-10-CM | POA: Diagnosis not present

## 2022-08-07 DIAGNOSIS — F32A Depression, unspecified: Secondary | ICD-10-CM | POA: Diagnosis not present

## 2022-08-07 DIAGNOSIS — I442 Atrioventricular block, complete: Secondary | ICD-10-CM | POA: Diagnosis not present

## 2022-08-07 DIAGNOSIS — I358 Other nonrheumatic aortic valve disorders: Secondary | ICD-10-CM | POA: Diagnosis not present

## 2022-08-07 DIAGNOSIS — F0283 Dementia in other diseases classified elsewhere, unspecified severity, with mood disturbance: Secondary | ICD-10-CM | POA: Diagnosis not present

## 2022-08-07 DIAGNOSIS — I4892 Unspecified atrial flutter: Secondary | ICD-10-CM | POA: Diagnosis not present

## 2022-08-07 DIAGNOSIS — A419 Sepsis, unspecified organism: Secondary | ICD-10-CM | POA: Diagnosis not present

## 2022-08-07 DIAGNOSIS — I5022 Chronic systolic (congestive) heart failure: Secondary | ICD-10-CM | POA: Diagnosis not present

## 2022-08-07 DIAGNOSIS — E039 Hypothyroidism, unspecified: Secondary | ICD-10-CM | POA: Diagnosis not present

## 2022-08-07 DIAGNOSIS — I110 Hypertensive heart disease with heart failure: Secondary | ICD-10-CM | POA: Diagnosis not present

## 2022-08-07 DIAGNOSIS — J449 Chronic obstructive pulmonary disease, unspecified: Secondary | ICD-10-CM | POA: Diagnosis not present

## 2022-08-07 DIAGNOSIS — F0284 Dementia in other diseases classified elsewhere, unspecified severity, with anxiety: Secondary | ICD-10-CM | POA: Diagnosis not present

## 2022-08-07 DIAGNOSIS — F02818 Dementia in other diseases classified elsewhere, unspecified severity, with other behavioral disturbance: Secondary | ICD-10-CM | POA: Diagnosis not present

## 2022-08-07 DIAGNOSIS — T826XXA Infection and inflammatory reaction due to cardiac valve prosthesis, initial encounter: Secondary | ICD-10-CM | POA: Diagnosis not present

## 2022-08-07 DIAGNOSIS — Z7901 Long term (current) use of anticoagulants: Secondary | ICD-10-CM | POA: Diagnosis not present

## 2022-08-07 DIAGNOSIS — Z86711 Personal history of pulmonary embolism: Secondary | ICD-10-CM | POA: Diagnosis not present

## 2022-08-07 NOTE — Progress Notes (Signed)
   Care Guide Note  08/07/2022 Name: Jessica [name] MRN: [id number] DOB: Dec 28, 1933  Referred by: Tresa Garter, MD Reason for referral : Care Coordination (Outreach to schedule pharm d )   Jessica [name] is a 89 y.o. year old female who is a primary care patient of Plotnikov, Georgina Quint, MD. [name] was referred to the pharmacist for assistance related to DM.    Successful contact was made with the patient to discuss pharmacy services including being ready for the pharmacist to call at least 5 minutes before the scheduled appointment time, to have medication bottles and any blood sugar or blood pressure readings ready for review. The patient agreed to meet with the pharmacist via with the pharmacist via telephone visit on (date/time).  08/10/2022  Penne Lash, RMA Care Guide [facility]  [place], [place] [address] Direct Dial: [phone] [email or web address]

## 2022-08-07 NOTE — Progress Notes (Signed)
   Care Guide Note  08/07/2022 Name: Jessica [name] MRN: [id number] DOB: 1934/02/10  Referred by: Tresa Garter, MD Reason for referral : Care Coordination (Outreach to schedule pharm d )   Jessica [name] is a 89 y.o. year old female who is a primary care patient of Plotnikov, Georgina Quint, MD. [name] was referred to the pharmacist for assistance related to DM.    An unsuccessful telephone outreach was attempted today to contact the patient who was referred to the pharmacy team for assistance with medication assistance. Additional attempts will be made to contact the patient.   Penne Lash, RMA Care Guide [facility]  [place], [place] [address] Direct Dial: [phone] [email or web address]

## 2022-08-08 DIAGNOSIS — Z8616 Personal history of COVID-19: Secondary | ICD-10-CM | POA: Diagnosis not present

## 2022-08-08 DIAGNOSIS — J449 Chronic obstructive pulmonary disease, unspecified: Secondary | ICD-10-CM | POA: Diagnosis not present

## 2022-08-08 DIAGNOSIS — Z7901 Long term (current) use of anticoagulants: Secondary | ICD-10-CM | POA: Diagnosis not present

## 2022-08-08 DIAGNOSIS — Z556 Problems related to health literacy: Secondary | ICD-10-CM | POA: Diagnosis not present

## 2022-08-08 DIAGNOSIS — I5033 Acute on chronic diastolic (congestive) heart failure: Secondary | ICD-10-CM | POA: Diagnosis not present

## 2022-08-08 DIAGNOSIS — A419 Sepsis, unspecified organism: Secondary | ICD-10-CM | POA: Diagnosis not present

## 2022-08-08 DIAGNOSIS — T826XXA Infection and inflammatory reaction due to cardiac valve prosthesis, initial encounter: Secondary | ICD-10-CM | POA: Diagnosis not present

## 2022-08-08 DIAGNOSIS — I110 Hypertensive heart disease with heart failure: Secondary | ICD-10-CM | POA: Diagnosis not present

## 2022-08-08 DIAGNOSIS — E876 Hypokalemia: Secondary | ICD-10-CM | POA: Diagnosis not present

## 2022-08-08 DIAGNOSIS — E039 Hypothyroidism, unspecified: Secondary | ICD-10-CM | POA: Diagnosis not present

## 2022-08-08 DIAGNOSIS — I480 Paroxysmal atrial fibrillation: Secondary | ICD-10-CM | POA: Diagnosis not present

## 2022-08-08 DIAGNOSIS — M1612 Unilateral primary osteoarthritis, left hip: Secondary | ICD-10-CM | POA: Diagnosis not present

## 2022-08-08 DIAGNOSIS — F0283 Dementia in other diseases classified elsewhere, unspecified severity, with mood disturbance: Secondary | ICD-10-CM | POA: Diagnosis not present

## 2022-08-08 DIAGNOSIS — Z7984 Long term (current) use of oral hypoglycemic drugs: Secondary | ICD-10-CM | POA: Diagnosis not present

## 2022-08-08 DIAGNOSIS — F02818 Dementia in other diseases classified elsewhere, unspecified severity, with other behavioral disturbance: Secondary | ICD-10-CM | POA: Diagnosis not present

## 2022-08-08 DIAGNOSIS — F0284 Dementia in other diseases classified elsewhere, unspecified severity, with anxiety: Secondary | ICD-10-CM | POA: Diagnosis not present

## 2022-08-08 DIAGNOSIS — E1140 Type 2 diabetes mellitus with diabetic neuropathy, unspecified: Secondary | ICD-10-CM | POA: Diagnosis not present

## 2022-08-08 DIAGNOSIS — I5022 Chronic systolic (congestive) heart failure: Secondary | ICD-10-CM | POA: Diagnosis not present

## 2022-08-08 DIAGNOSIS — I442 Atrioventricular block, complete: Secondary | ICD-10-CM | POA: Diagnosis not present

## 2022-08-08 DIAGNOSIS — Z86711 Personal history of pulmonary embolism: Secondary | ICD-10-CM | POA: Diagnosis not present

## 2022-08-08 DIAGNOSIS — I4892 Unspecified atrial flutter: Secondary | ICD-10-CM | POA: Diagnosis not present

## 2022-08-08 DIAGNOSIS — F32A Depression, unspecified: Secondary | ICD-10-CM | POA: Diagnosis not present

## 2022-08-08 DIAGNOSIS — I358 Other nonrheumatic aortic valve disorders: Secondary | ICD-10-CM | POA: Diagnosis not present

## 2022-08-08 DIAGNOSIS — K219 Gastro-esophageal reflux disease without esophagitis: Secondary | ICD-10-CM | POA: Diagnosis not present

## 2022-08-09 DIAGNOSIS — Z7984 Long term (current) use of oral hypoglycemic drugs: Secondary | ICD-10-CM | POA: Diagnosis not present

## 2022-08-09 DIAGNOSIS — E876 Hypokalemia: Secondary | ICD-10-CM | POA: Diagnosis not present

## 2022-08-09 DIAGNOSIS — M1612 Unilateral primary osteoarthritis, left hip: Secondary | ICD-10-CM | POA: Diagnosis not present

## 2022-08-09 DIAGNOSIS — J449 Chronic obstructive pulmonary disease, unspecified: Secondary | ICD-10-CM | POA: Diagnosis not present

## 2022-08-09 DIAGNOSIS — I4892 Unspecified atrial flutter: Secondary | ICD-10-CM | POA: Diagnosis not present

## 2022-08-09 DIAGNOSIS — K219 Gastro-esophageal reflux disease without esophagitis: Secondary | ICD-10-CM | POA: Diagnosis not present

## 2022-08-09 DIAGNOSIS — I110 Hypertensive heart disease with heart failure: Secondary | ICD-10-CM | POA: Diagnosis not present

## 2022-08-09 DIAGNOSIS — I5022 Chronic systolic (congestive) heart failure: Secondary | ICD-10-CM | POA: Diagnosis not present

## 2022-08-09 DIAGNOSIS — F0284 Dementia in other diseases classified elsewhere, unspecified severity, with anxiety: Secondary | ICD-10-CM | POA: Diagnosis not present

## 2022-08-09 DIAGNOSIS — A419 Sepsis, unspecified organism: Secondary | ICD-10-CM | POA: Diagnosis not present

## 2022-08-09 DIAGNOSIS — F32A Depression, unspecified: Secondary | ICD-10-CM | POA: Diagnosis not present

## 2022-08-09 DIAGNOSIS — E1140 Type 2 diabetes mellitus with diabetic neuropathy, unspecified: Secondary | ICD-10-CM | POA: Diagnosis not present

## 2022-08-09 DIAGNOSIS — T826XXA Infection and inflammatory reaction due to cardiac valve prosthesis, initial encounter: Secondary | ICD-10-CM | POA: Diagnosis not present

## 2022-08-09 DIAGNOSIS — Z8616 Personal history of COVID-19: Secondary | ICD-10-CM | POA: Diagnosis not present

## 2022-08-09 DIAGNOSIS — I480 Paroxysmal atrial fibrillation: Secondary | ICD-10-CM | POA: Diagnosis not present

## 2022-08-09 DIAGNOSIS — I358 Other nonrheumatic aortic valve disorders: Secondary | ICD-10-CM | POA: Diagnosis not present

## 2022-08-09 DIAGNOSIS — Z86711 Personal history of pulmonary embolism: Secondary | ICD-10-CM | POA: Diagnosis not present

## 2022-08-09 DIAGNOSIS — E039 Hypothyroidism, unspecified: Secondary | ICD-10-CM | POA: Diagnosis not present

## 2022-08-09 DIAGNOSIS — I442 Atrioventricular block, complete: Secondary | ICD-10-CM | POA: Diagnosis not present

## 2022-08-09 DIAGNOSIS — I5033 Acute on chronic diastolic (congestive) heart failure: Secondary | ICD-10-CM | POA: Diagnosis not present

## 2022-08-09 DIAGNOSIS — F02818 Dementia in other diseases classified elsewhere, unspecified severity, with other behavioral disturbance: Secondary | ICD-10-CM | POA: Diagnosis not present

## 2022-08-09 DIAGNOSIS — Z556 Problems related to health literacy: Secondary | ICD-10-CM | POA: Diagnosis not present

## 2022-08-09 DIAGNOSIS — Z7901 Long term (current) use of anticoagulants: Secondary | ICD-10-CM | POA: Diagnosis not present

## 2022-08-09 DIAGNOSIS — F0283 Dementia in other diseases classified elsewhere, unspecified severity, with mood disturbance: Secondary | ICD-10-CM | POA: Diagnosis not present

## 2022-08-10 ENCOUNTER — Telehealth (HOSPITAL_COMMUNITY): Payer: Self-pay | Admitting: Licensed Clinical Social Worker

## 2022-08-10 ENCOUNTER — Other Ambulatory Visit: Payer: Medicare Other

## 2022-08-10 NOTE — Telephone Encounter (Signed)
H&V Care Navigation CSW Progress Note  Clinical Social Worker consulted to help assess if further assistance is available to help pt get jardiance and eliquis.  Pt denied for jardiance assistance until they apply for LIS but sounds like maybe they are having problems completing this- CSW reached out to complete application over the phone but unable to reach- left VM requesting return call.  SDOH Screenings   Food Insecurity: No Food Insecurity (07/04/2022)  Housing: Low Risk  (07/04/2022)  Transportation Needs: No Transportation Needs (07/04/2022)  Utilities: Not At Risk (07/04/2022)  Alcohol Screen: Low Risk  (07/04/2022)  Depression (PHQ2-9): Low Risk  (07/04/2022)  Financial Resource Strain: Low Risk  (07/04/2022)  Physical Activity: Inactive (07/04/2022)  Social Connections: Socially Isolated (07/04/2022)  Stress: No Stress Concern Present (07/04/2022)  Tobacco Use: Low Risk  (07/14/2022)   Burna Sis, LCSW Clinical Social Worker [facility] Desk#: [phone] Cell#: [phone]

## 2022-08-10 NOTE — Progress Notes (Signed)
   08/10/2022  Patient ID: Jessica [name], female   DOB: 1934-02-02, 89 y.o.   MRN: [id number]  Subjective/Objective: Telephone visit with niece, [name], to discuss medication access and affordability in regard to referral placed by Gillian Shields, NP with cardiology  Medication Management/Access -Patient has PAP for Entresto but has been denied for Jardiance -She is also needing assistance with affordability of Eliquis -Both of these medications were going to cost $141/90d, which is not affordable for patient -Jardiance denial stated patient needed to apply for LIS; and if denied, provide denial letter -Niece has contacted Social Security and [facility] to no avail -States patient has samples of Jardiance and Eliquis provided by cardiology 4/18 but is not taking, because she does not want to start if she won't be able to continue therapy  Assessment/Plan:  Medication Management/Access -Discussed eligibility for Medicare Extra Help (LIS) and application process -Provided phone number to reach Extra Help support and emailed link to apply online -Gave my direct number if further needs arise, and asked that they keep me informed of application status; so I can help with PAP applications if denied LIS -Stressed importance of taking prescribed medications, especially Eliquis, as I believe we will find an option for the patient to continue therapy  Follow-up:  Will check in 7-10 days from now if I have not received an update from patient/niece  Lenna Gilford, PharmD, DPLA

## 2022-08-11 ENCOUNTER — Telehealth (HOSPITAL_BASED_OUTPATIENT_CLINIC_OR_DEPARTMENT_OTHER): Payer: Self-pay | Admitting: *Deleted

## 2022-08-11 DIAGNOSIS — Z950 Presence of cardiac pacemaker: Secondary | ICD-10-CM

## 2022-08-11 DIAGNOSIS — I442 Atrioventricular block, complete: Secondary | ICD-10-CM

## 2022-08-11 DIAGNOSIS — I5032 Chronic diastolic (congestive) heart failure: Secondary | ICD-10-CM

## 2022-08-11 DIAGNOSIS — I4891 Unspecified atrial fibrillation: Secondary | ICD-10-CM

## 2022-08-11 DIAGNOSIS — D6859 Other primary thrombophilia: Secondary | ICD-10-CM

## 2022-08-11 NOTE — Telephone Encounter (Addendum)
Message Received: Jessica [name], Storm Frisk, NP  Burna Sis, LCSW Cc: P Cv Div Dwb Triage Thanks for calling her. I'm unclear the delay with LIS as she and her nieve have been provided the info multiple times. She does need to be on Eliquis due to PAF. Will add our nursing team to see if we can get her 2-3 weeks of samples. Otherwise will have to change to Warfarin.  Marria Mathison/Teri - Can we call and get her ELiquis samples please? Based on her CHADSVASc her risk of stroke if not on Eliquis is 11.2% so very important she is taking it.  Alver Sorrow, NP       Previous Messages    ----- Message ----- From: Aaron Mose Sent: 08/10/2022  10:36 AM EDT To: Alver Sorrow, NP; Lenna Gilford, RPH  I will reach out and help apply for LIS over the phone if they have not already managed to apply  ----- Message ----- From: Lenna Gilford, [facility] Sent: 08/10/2022  10:05 AM EDT To: Burna Sis, LCSW; Alver Sorrow, NP  FYI   Left message to call back   Advised patient samples available

## 2022-08-14 ENCOUNTER — Telehealth (HOSPITAL_COMMUNITY): Payer: Self-pay | Admitting: Licensed Clinical Social Worker

## 2022-08-14 ENCOUNTER — Encounter: Payer: Self-pay | Admitting: Internal Medicine

## 2022-08-14 ENCOUNTER — Ambulatory Visit (INDEPENDENT_AMBULATORY_CARE_PROVIDER_SITE_OTHER): Payer: Medicare Other | Admitting: Internal Medicine

## 2022-08-14 DIAGNOSIS — L659 Nonscarring hair loss, unspecified: Secondary | ICD-10-CM

## 2022-08-14 DIAGNOSIS — Z8616 Personal history of COVID-19: Secondary | ICD-10-CM | POA: Diagnosis not present

## 2022-08-14 DIAGNOSIS — F02818 Dementia in other diseases classified elsewhere, unspecified severity, with other behavioral disturbance: Secondary | ICD-10-CM | POA: Diagnosis not present

## 2022-08-14 DIAGNOSIS — E559 Vitamin D deficiency, unspecified: Secondary | ICD-10-CM | POA: Diagnosis not present

## 2022-08-14 DIAGNOSIS — A419 Sepsis, unspecified organism: Secondary | ICD-10-CM | POA: Diagnosis not present

## 2022-08-14 DIAGNOSIS — R202 Paresthesia of skin: Secondary | ICD-10-CM

## 2022-08-14 DIAGNOSIS — E876 Hypokalemia: Secondary | ICD-10-CM | POA: Diagnosis not present

## 2022-08-14 DIAGNOSIS — I38 Endocarditis, valve unspecified: Secondary | ICD-10-CM

## 2022-08-14 DIAGNOSIS — F0284 Dementia in other diseases classified elsewhere, unspecified severity, with anxiety: Secondary | ICD-10-CM | POA: Diagnosis not present

## 2022-08-14 DIAGNOSIS — Z87898 Personal history of other specified conditions: Secondary | ICD-10-CM | POA: Diagnosis not present

## 2022-08-14 DIAGNOSIS — I358 Other nonrheumatic aortic valve disorders: Secondary | ICD-10-CM | POA: Diagnosis not present

## 2022-08-14 DIAGNOSIS — Z86711 Personal history of pulmonary embolism: Secondary | ICD-10-CM | POA: Diagnosis not present

## 2022-08-14 DIAGNOSIS — J449 Chronic obstructive pulmonary disease, unspecified: Secondary | ICD-10-CM | POA: Diagnosis not present

## 2022-08-14 DIAGNOSIS — E039 Hypothyroidism, unspecified: Secondary | ICD-10-CM | POA: Diagnosis not present

## 2022-08-14 DIAGNOSIS — I5022 Chronic systolic (congestive) heart failure: Secondary | ICD-10-CM | POA: Diagnosis not present

## 2022-08-14 DIAGNOSIS — Z556 Problems related to health literacy: Secondary | ICD-10-CM | POA: Diagnosis not present

## 2022-08-14 DIAGNOSIS — T826XXD Infection and inflammatory reaction due to cardiac valve prosthesis, subsequent encounter: Secondary | ICD-10-CM

## 2022-08-14 DIAGNOSIS — I5033 Acute on chronic diastolic (congestive) heart failure: Secondary | ICD-10-CM | POA: Diagnosis not present

## 2022-08-14 DIAGNOSIS — Z7984 Long term (current) use of oral hypoglycemic drugs: Secondary | ICD-10-CM | POA: Diagnosis not present

## 2022-08-14 DIAGNOSIS — I480 Paroxysmal atrial fibrillation: Secondary | ICD-10-CM | POA: Diagnosis not present

## 2022-08-14 DIAGNOSIS — I4892 Unspecified atrial flutter: Secondary | ICD-10-CM | POA: Diagnosis not present

## 2022-08-14 DIAGNOSIS — I110 Hypertensive heart disease with heart failure: Secondary | ICD-10-CM | POA: Diagnosis not present

## 2022-08-14 DIAGNOSIS — Z7901 Long term (current) use of anticoagulants: Secondary | ICD-10-CM | POA: Diagnosis not present

## 2022-08-14 DIAGNOSIS — E118 Type 2 diabetes mellitus with unspecified complications: Secondary | ICD-10-CM | POA: Diagnosis not present

## 2022-08-14 DIAGNOSIS — K219 Gastro-esophageal reflux disease without esophagitis: Secondary | ICD-10-CM | POA: Diagnosis not present

## 2022-08-14 DIAGNOSIS — F0283 Dementia in other diseases classified elsewhere, unspecified severity, with mood disturbance: Secondary | ICD-10-CM | POA: Diagnosis not present

## 2022-08-14 DIAGNOSIS — I442 Atrioventricular block, complete: Secondary | ICD-10-CM | POA: Diagnosis not present

## 2022-08-14 DIAGNOSIS — T826XXA Infection and inflammatory reaction due to cardiac valve prosthesis, initial encounter: Secondary | ICD-10-CM | POA: Diagnosis not present

## 2022-08-14 DIAGNOSIS — F32A Depression, unspecified: Secondary | ICD-10-CM | POA: Diagnosis not present

## 2022-08-14 DIAGNOSIS — M1612 Unilateral primary osteoarthritis, left hip: Secondary | ICD-10-CM | POA: Diagnosis not present

## 2022-08-14 DIAGNOSIS — E1140 Type 2 diabetes mellitus with diabetic neuropathy, unspecified: Secondary | ICD-10-CM | POA: Diagnosis not present

## 2022-08-14 LAB — VITAMIN D 25 HYDROXY (VIT D DEFICIENCY, FRACTURES): VITD: 57.52 ng/mL (ref 30.00–100.00)

## 2022-08-14 LAB — COMPREHENSIVE METABOLIC PANEL
ALT: 20 U/L (ref 0–35)
AST: 29 U/L (ref 0–37)
Albumin: 4 g/dL (ref 3.5–5.2)
Alkaline Phosphatase: 73 U/L (ref 39–117)
BUN: 25 mg/dL — ABNORMAL HIGH (ref 6–23)
CO2: 28 mEq/L (ref 19–32)
Calcium: 10.1 mg/dL (ref 8.4–10.5)
Chloride: 100 mEq/L (ref 96–112)
Creatinine, Ser: 0.96 mg/dL (ref 0.40–1.20)
GFR: 53.42 mL/min — ABNORMAL LOW (ref >60.00)
Glucose, Bld: 165 mg/dL — ABNORMAL HIGH (ref 70–99)
Potassium: 4.7 mEq/L (ref 3.5–5.1)
Sodium: 136 mEq/L (ref 135–145)
Total Bilirubin: 0.3 mg/dL (ref 0.2–1.2)
Total Protein: 8.3 g/dL (ref 6.0–8.3)

## 2022-08-14 LAB — T4, FREE: Free T4: 0.9 ng/dL (ref 0.60–1.60)

## 2022-08-14 LAB — TSH: TSH: 3.43 u[IU]/mL (ref 0.35–5.50)

## 2022-08-14 LAB — VITAMIN B12: Vitamin B-12: 1141 pg/mL — ABNORMAL HIGH (ref 211–911)

## 2022-08-14 LAB — HEMOGLOBIN A1C: Hgb A1c MFr Bld: 8.1 % — ABNORMAL HIGH (ref 4.6–6.5)

## 2022-08-14 MED ORDER — APIXABAN 5 MG PO TABS
5.0000 mg | ORAL_TABLET | Freq: Two times a day (BID) | ORAL | 0 refills | Status: DC
Start: 2022-08-14 — End: 2022-10-13

## 2022-08-14 NOTE — Assessment & Plan Note (Signed)
Wt Readings from Last 3 Encounters:  08/14/22 219 lb (99.3 kg)  07/14/22 221 lb (100.2 kg)  07/04/22 217 lb (98.4 kg)  On iet

## 2022-08-14 NOTE — Assessment & Plan Note (Signed)
On daily Abx

## 2022-08-14 NOTE — Assessment & Plan Note (Signed)
Check TSH Start B omplex

## 2022-08-14 NOTE — Telephone Encounter (Signed)
H&V Care Navigation CSW Progress Note  Clinical Social Worker spoke with pt and pt niece over the phone and assisted in applying for Extra Help/LIS program.  Informed pt and niece that they will get notice in the mail in around a month with the decision.  Once they get this letter they should call pharmacy team to discuss next steps to submit this denial to Jardiance assistance program so she can get approved.  Pt and niece expressed understanding and CSW will plan to touch base with them in about a month to check in.  SDOH Screenings   Food Insecurity: No Food Insecurity (07/04/2022)  Housing: Low Risk  (07/04/2022)  Transportation Needs: No Transportation Needs (07/04/2022)  Utilities: Not At Risk (07/04/2022)  Alcohol Screen: Low Risk  (07/04/2022)  Depression (PHQ2-9): Low Risk  (07/04/2022)  Financial Resource Strain: Low Risk  (07/04/2022)  Physical Activity: Inactive (07/04/2022)  Social Connections: Socially Isolated (07/04/2022)  Stress: No Stress Concern Present (07/04/2022)  Tobacco Use: Low Risk  (07/14/2022)    Burna Sis, LCSW Clinical Social Worker [facility] Desk#: [phone] Cell#: [phone]

## 2022-08-14 NOTE — Assessment & Plan Note (Signed)
Chronic  ?Cont on Levothroid ?Check TSH ?

## 2022-08-14 NOTE — Progress Notes (Signed)
Subjective:  Patient ID: Jessica [name], female    DOB: [birth year]  Age: 89 y.o. MRN: [id number]  CC: No chief complaint on file.   HPI Jessica [name] presents for CHF, hair loss, DM Here w/Sharon C/o hair loss  Outpatient Medications Prior to Visit  Medication Sig Dispense Refill   albuterol (VENTOLIN HFA) 108 (90 Base) MCG/ACT inhaler Inhale 2 puffs into the lungs every 6 (six) hours as needed for wheezing or shortness of breath. 8 g 2   allopurinol (ZYLOPRIM) 100 MG tablet TAKE 1 TABLET BY MOUTH DAILY (Patient taking differently: Take 100 mg by mouth daily.) 100 tablet 2   b complex vitamins tablet Take 1 tablet by mouth daily.     cefadroxil (DURICEF) 500 MG capsule Take 1 capsule (500 mg total) by mouth 2 (two) times daily. 60 capsule 2   Cholecalciferol 1000 UNITS tablet Take 1,000 Units by mouth daily.     ezetimibe (ZETIA) 10 MG tablet TAKE 1 TABLET BY MOUTH DAILY 90 tablet 2   furosemide (LASIX) 40 MG tablet Take 40mg  tablet daily with an extra tablet in the afternoon for swelling, weight gain of 2 pounds overnight or 5 pounds in one week! 100 tablet 3   levothyroxine (SYNTHROID) 75 MCG tablet TAKE 1 TABLET BY MOUTH DAILY (Patient taking differently: Take by mouth daily before breakfast.) 100 tablet 2   metFORMIN (GLUCOPHAGE) 500 MG tablet TAKE 1 TABLET BY MOUTH TWICE  DAILY WITH MEALS 200 tablet 2   metoprolol tartrate (LOPRESSOR) 25 MG tablet Take 1 tablet (25 mg total) by mouth 2 (two) times daily. 180 tablet 3   Multiple Vitamin (MULTIVITAMIN) tablet Take 1 tablet by mouth daily.     nystatin powder Apply 1 Application topically daily. Apply to under breast topically every day shift for redness/moisture.     repaglinide (PRANDIN) 2 MG tablet TAKE 2 TABLETS BY MOUTH 3  TIMES DAILY BEFORE MEALS (Patient taking differently: Take 2 mg by mouth 3 (three) times daily before meals.) 540 tablet 3   rosuvastatin (CRESTOR) 10 MG tablet TAKE 1 TABLET BY MOUTH UP TO 3  TIMES  WEEKLY AS TOLERATED (Patient taking differently: Take 10 mg by mouth every Monday, Wednesday, and Friday.) 43 tablet 2   saccharomyces boulardii (FLORASTOR) 250 MG capsule Take 250 mg by mouth 2 (two) times daily.     sacubitril-valsartan (ENTRESTO) 97-103 MG Take 1 tablet by mouth 2 (two) times daily. 180 tablet 3   spironolactone (ALDACTONE) 25 MG tablet Take 0.5 tablets (12.5 mg total) by mouth daily. 100 tablet 3   temazepam (RESTORIL) 15 MG capsule TAKE 1 CAPSULE BY MOUTH EVERY NIGHT AT BEDTIME AS NEEDED 90 capsule 1   temazepam (RESTORIL) 7.5 MG capsule Take 7.5 mg by mouth at bedtime.     venlafaxine XR (EFFEXOR-XR) 75 MG 24 hr capsule TAKE 1 CAPSULE BY MOUTH DAILY  WITH BREAKFAST 100 capsule 1   apixaban (ELIQUIS) 5 MG TABS tablet Take 1 tablet (5 mg total) by mouth 2 (two) times daily. (Patient not taking: Reported on 08/10/2022) 180 tablet 3   apixaban (ELIQUIS) 5 MG TABS tablet Take 1 tablet (5 mg total) by mouth 2 (two) times daily for 21 days. (Patient not taking: Reported on 08/14/2022) 42 tablet 0   empagliflozin (JARDIANCE) 10 MG TABS tablet Take 1 tablet (10 mg total) by mouth daily. (Patient not taking: Reported on 08/10/2022) 90 tablet 3   empagliflozin (JARDIANCE) 10 MG TABS tablet Take 1 tablet (  10 mg total) by mouth daily before breakfast. (Patient not taking: Reported on 08/10/2022) 21 tablet 0   famotidine (PEPCID) 20 MG tablet Take 1 tablet (20 mg total) by mouth daily. 30 tablet 0   No facility-administered medications prior to visit.    ROS: Review of Systems  Constitutional:  Positive for fatigue. Negative for activity change, appetite change, chills and unexpected weight change.  HENT:  Negative for congestion, mouth sores and sinus pressure.   Eyes:  Negative for visual disturbance.  Respiratory:  Negative for cough and chest tightness.   Gastrointestinal:  Negative for abdominal pain and nausea.  Genitourinary:  Negative for difficulty urinating, frequency and vaginal  pain.  Musculoskeletal:  Positive for arthralgias, back pain and gait problem.  Skin:  Negative for pallor and rash.  Neurological:  Positive for weakness. Negative for dizziness, tremors, numbness and headaches.  Psychiatric/Behavioral:  Positive for decreased concentration. Negative for confusion, sleep disturbance and suicidal ideas. The patient is nervous/anxious.     Objective:  BP 128/78 (BP Location: Left Arm, Patient Position: Sitting, Cuff Size: Normal)   Pulse 65   Temp 98.2 F (36.8 C) (Oral)   Ht 5\' 2"  (1.575 m)   Wt 219 lb (99.3 kg)   SpO2 95%   BMI 40.06 kg/m   BP Readings from Last 3 Encounters:  08/14/22 128/78  07/14/22 134/60  06/26/22 102/70    Wt Readings from Last 3 Encounters:  08/14/22 219 lb (99.3 kg)  07/14/22 221 lb (100.2 kg)  07/04/22 217 lb (98.4 kg)    Physical Exam Constitutional:      General: She is not in acute distress.    Appearance: She is well-developed. She is obese.  HENT:     Head: Normocephalic.     Right Ear: External ear normal.     Left Ear: External ear normal.     Nose: Nose normal.  Eyes:     General:        Right eye: No discharge.        Left eye: No discharge.     Conjunctiva/sclera: Conjunctivae normal.     Pupils: Pupils are equal, round, and reactive to light.  Neck:     Thyroid: No thyromegaly.     Vascular: No JVD.     Trachea: No tracheal deviation.  Cardiovascular:     Rate and Rhythm: Normal rate and regular rhythm.     Heart sounds: Normal heart sounds.  Pulmonary:     Effort: No respiratory distress.     Breath sounds: No stridor. No wheezing.  Abdominal:     General: Bowel sounds are normal. There is no distension.     Palpations: Abdomen is soft. There is no mass.     Tenderness: There is no abdominal tenderness. There is no guarding or rebound.  Musculoskeletal:        General: No tenderness.     Cervical back: Normal range of motion and neck supple. No rigidity.     Right lower leg: No  edema.     Left lower leg: No edema.  Lymphadenopathy:     Cervical: No cervical adenopathy.  Skin:    Findings: No erythema or rash.  Neurological:     Mental Status: Mental status is at baseline.     Cranial Nerves: No cranial nerve deficit.     Motor: No abnormal muscle tone.     Coordination: Coordination normal.     Gait: Gait abnormal.  Deep Tendon Reflexes: Reflexes normal.  Psychiatric:        Behavior: Behavior normal.        Thought Content: Thought content normal.        Judgment: Judgment normal.   In a w/c    A total time of 45 minutes was spent preparing to see the patient, reviewing tests, x-rays, operative reports and other medical records.  Also, obtaining history and performing comprehensive physical exam.  Additionally, counseling the patient regarding the above listed issues.   Finally, documenting clinical information in the health records, coordination of care, educating the patient. It is a complex case.   Lab Results  Component Value Date   WBC 6.7 07/14/2022   HGB 11.7 07/14/2022   HCT 35.4 07/14/2022   PLT 244 07/14/2022   GLUCOSE 161 (H) 07/14/2022   CHOL 207 (H) 07/12/2020   TRIG 277 (H) 07/12/2020   HDL 39 (L) 07/12/2020   LDLDIRECT 155.9 09/30/2012   LDLCALC 119 (H) 07/12/2020   ALT 21 06/20/2022   AST 34 06/20/2022   NA 138 07/14/2022   K 5.0 07/14/2022   CL 99 07/14/2022   CREATININE 0.93 07/14/2022   BUN 20 07/14/2022   CO2 22 07/14/2022   TSH 1.460 03/28/2022   INR 3.9 (H) 04/25/2022   HGBA1C 8.4 (H) 04/19/2022   MICROALBUR 4.5 (H) 12/02/2009    DG Hip Unilat With Pelvis 2-3 Views Left  Result Date: 05/14/2022 CLINICAL DATA:  Cough, weakness, hip pain EXAM: DG HIP (WITH OR WITHOUT PELVIS) 2-3V LEFT COMPARISON:  None Available. FINDINGS: No acute fracture or dislocation. No aggressive osseous lesion. Normal alignment. Mild osteoarthritis of the left hip. Generalized osteopenia. Soft tissue are unremarkable. No radiopaque foreign  body or soft tissue emphysema. IMPRESSION: 1. Mild osteoarthritis of the left hip. Given the patient's age and osteopenia, if there is persistent clinical concern for an occult hip fracture, a MRI of the hip is recommended for increased sensitivity. Electronically Signed   By: Elige Ko M.D.   On: 05/14/2022 09:35   DG Chest Port 1 View  Result Date: 05/14/2022 CLINICAL DATA:  Cough, pain EXAM: PORTABLE CHEST 1 VIEW COMPARISON:  04/25/2022 FINDINGS: Bilateral interstitial thickening. Small bilateral pleural effusions. Bibasilar atelectasis. No pneumothorax. Stable cardiomegaly. Prior TAVR. Dual lead cardiac pacemaker. No acute osseous abnormality. IMPRESSION: 1. Findings concerning for CHF. Electronically Signed   By: Elige Ko M.D.   On: 05/14/2022 09:34    Assessment & Plan:   Problem List Items Addressed This Visit     Hypothyroidism    Chronic  Cont on Levothroid Check TSH      Relevant Orders   Comprehensive metabolic panel   T4, free   TSH   Vitamin B12   VITAMIN D 25 Hydroxy (Vit-D Deficiency, Fractures)   DM type 2, controlled, with complication (HCC)     She can continue with repaglinide and metformin.  We can add  Actos - she is to let me know if she is willing to start using insulin. Check A1c      Relevant Orders   Comprehensive metabolic panel   T4, free   TSH   Vitamin B12   VITAMIN D 25 Hydroxy (Vit-D Deficiency, Fractures)   Hemoglobin A1c   OBESITY, MORBID - Primary    Wt Readings from Last 3 Encounters:  08/14/22 219 lb (99.3 kg)  07/14/22 221 lb (100.2 kg)  07/04/22 217 lb (98.4 kg)  On iet  Acute on chronic diastolic CHF (congestive heart failure) (HCC)    Continue on Entresto, metoprolol Take Eliquis or 162 mg ASA a day      Prosthetic valve endocarditis (HCC)    On daily Abx      History of bacteremia    No fever      Relevant Orders   Comprehensive metabolic panel   T4, free   TSH   Vitamin B12   VITAMIN D 25 Hydroxy  (Vit-D Deficiency, Fractures)   Hair loss    Check TSH Start B omplex      Relevant Orders   Comprehensive metabolic panel   T4, free   TSH   Vitamin B12   VITAMIN D 25 Hydroxy (Vit-D Deficiency, Fractures)   Other Visit Diagnoses     Paresthesia       Relevant Orders   Vitamin B12   Vitamin D deficiency       Relevant Orders   VITAMIN D 25 Hydroxy (Vit-D Deficiency, Fractures)         No orders of the defined types were placed in this encounter.     Follow-up: Return in about 3 months (around 11/14/2022) for a follow-up visit.  Sonda Primes, MD

## 2022-08-14 NOTE — Assessment & Plan Note (Signed)
She can continue with repaglinide and metformin.  We can add  Actos - she is to let me know if she is willing to start using insulin. Check A1c

## 2022-08-14 NOTE — Assessment & Plan Note (Signed)
No fever

## 2022-08-14 NOTE — Assessment & Plan Note (Signed)
Continue on Entresto, metoprolol Take Eliquis or 162 mg ASA a day

## 2022-08-14 NOTE — Patient Instructions (Addendum)
[email or web address]  Telephone with large buttons  Burna Sis, LCSW Clinical Social Worker [facility] Desk#: [phone] Cell#: [phone]

## 2022-08-14 NOTE — Telephone Encounter (Signed)
SW team assisted patient to apply for [facility] (see phone note 08/14/22). Will work on samples to ensure coverage with Eliquis while awaiting determination.   Alver Sorrow, NP

## 2022-08-15 DIAGNOSIS — F0283 Dementia in other diseases classified elsewhere, unspecified severity, with mood disturbance: Secondary | ICD-10-CM | POA: Diagnosis not present

## 2022-08-15 DIAGNOSIS — Z7984 Long term (current) use of oral hypoglycemic drugs: Secondary | ICD-10-CM | POA: Diagnosis not present

## 2022-08-15 DIAGNOSIS — Z8616 Personal history of COVID-19: Secondary | ICD-10-CM | POA: Diagnosis not present

## 2022-08-15 DIAGNOSIS — M1612 Unilateral primary osteoarthritis, left hip: Secondary | ICD-10-CM | POA: Diagnosis not present

## 2022-08-15 DIAGNOSIS — F0284 Dementia in other diseases classified elsewhere, unspecified severity, with anxiety: Secondary | ICD-10-CM | POA: Diagnosis not present

## 2022-08-15 DIAGNOSIS — E1140 Type 2 diabetes mellitus with diabetic neuropathy, unspecified: Secondary | ICD-10-CM | POA: Diagnosis not present

## 2022-08-15 DIAGNOSIS — Z556 Problems related to health literacy: Secondary | ICD-10-CM | POA: Diagnosis not present

## 2022-08-15 DIAGNOSIS — I442 Atrioventricular block, complete: Secondary | ICD-10-CM | POA: Diagnosis not present

## 2022-08-15 DIAGNOSIS — F02818 Dementia in other diseases classified elsewhere, unspecified severity, with other behavioral disturbance: Secondary | ICD-10-CM | POA: Diagnosis not present

## 2022-08-15 DIAGNOSIS — Z7901 Long term (current) use of anticoagulants: Secondary | ICD-10-CM | POA: Diagnosis not present

## 2022-08-15 DIAGNOSIS — J449 Chronic obstructive pulmonary disease, unspecified: Secondary | ICD-10-CM | POA: Diagnosis not present

## 2022-08-15 DIAGNOSIS — I5033 Acute on chronic diastolic (congestive) heart failure: Secondary | ICD-10-CM | POA: Diagnosis not present

## 2022-08-15 DIAGNOSIS — T826XXA Infection and inflammatory reaction due to cardiac valve prosthesis, initial encounter: Secondary | ICD-10-CM | POA: Diagnosis not present

## 2022-08-15 DIAGNOSIS — I358 Other nonrheumatic aortic valve disorders: Secondary | ICD-10-CM | POA: Diagnosis not present

## 2022-08-15 DIAGNOSIS — I480 Paroxysmal atrial fibrillation: Secondary | ICD-10-CM | POA: Diagnosis not present

## 2022-08-15 DIAGNOSIS — I110 Hypertensive heart disease with heart failure: Secondary | ICD-10-CM | POA: Diagnosis not present

## 2022-08-15 DIAGNOSIS — K219 Gastro-esophageal reflux disease without esophagitis: Secondary | ICD-10-CM | POA: Diagnosis not present

## 2022-08-15 DIAGNOSIS — Z86711 Personal history of pulmonary embolism: Secondary | ICD-10-CM | POA: Diagnosis not present

## 2022-08-15 DIAGNOSIS — I5022 Chronic systolic (congestive) heart failure: Secondary | ICD-10-CM | POA: Diagnosis not present

## 2022-08-15 DIAGNOSIS — E876 Hypokalemia: Secondary | ICD-10-CM | POA: Diagnosis not present

## 2022-08-15 DIAGNOSIS — A419 Sepsis, unspecified organism: Secondary | ICD-10-CM | POA: Diagnosis not present

## 2022-08-15 DIAGNOSIS — I4892 Unspecified atrial flutter: Secondary | ICD-10-CM | POA: Diagnosis not present

## 2022-08-15 DIAGNOSIS — F32A Depression, unspecified: Secondary | ICD-10-CM | POA: Diagnosis not present

## 2022-08-15 DIAGNOSIS — E039 Hypothyroidism, unspecified: Secondary | ICD-10-CM | POA: Diagnosis not present

## 2022-08-15 NOTE — Telephone Encounter (Signed)
Spoke with patient 5/10 and advised samples available, verbalized understanding

## 2022-08-17 DIAGNOSIS — I358 Other nonrheumatic aortic valve disorders: Secondary | ICD-10-CM | POA: Diagnosis not present

## 2022-08-17 DIAGNOSIS — Z7984 Long term (current) use of oral hypoglycemic drugs: Secondary | ICD-10-CM | POA: Diagnosis not present

## 2022-08-17 DIAGNOSIS — K219 Gastro-esophageal reflux disease without esophagitis: Secondary | ICD-10-CM | POA: Diagnosis not present

## 2022-08-17 DIAGNOSIS — I5022 Chronic systolic (congestive) heart failure: Secondary | ICD-10-CM | POA: Diagnosis not present

## 2022-08-17 DIAGNOSIS — I442 Atrioventricular block, complete: Secondary | ICD-10-CM | POA: Diagnosis not present

## 2022-08-17 DIAGNOSIS — E876 Hypokalemia: Secondary | ICD-10-CM | POA: Diagnosis not present

## 2022-08-17 DIAGNOSIS — I110 Hypertensive heart disease with heart failure: Secondary | ICD-10-CM | POA: Diagnosis not present

## 2022-08-17 DIAGNOSIS — I4892 Unspecified atrial flutter: Secondary | ICD-10-CM | POA: Diagnosis not present

## 2022-08-17 DIAGNOSIS — I5033 Acute on chronic diastolic (congestive) heart failure: Secondary | ICD-10-CM | POA: Diagnosis not present

## 2022-08-17 DIAGNOSIS — F0284 Dementia in other diseases classified elsewhere, unspecified severity, with anxiety: Secondary | ICD-10-CM | POA: Diagnosis not present

## 2022-08-17 DIAGNOSIS — E039 Hypothyroidism, unspecified: Secondary | ICD-10-CM | POA: Diagnosis not present

## 2022-08-17 DIAGNOSIS — Z556 Problems related to health literacy: Secondary | ICD-10-CM | POA: Diagnosis not present

## 2022-08-17 DIAGNOSIS — Z86711 Personal history of pulmonary embolism: Secondary | ICD-10-CM | POA: Diagnosis not present

## 2022-08-17 DIAGNOSIS — E1140 Type 2 diabetes mellitus with diabetic neuropathy, unspecified: Secondary | ICD-10-CM | POA: Diagnosis not present

## 2022-08-17 DIAGNOSIS — F02818 Dementia in other diseases classified elsewhere, unspecified severity, with other behavioral disturbance: Secondary | ICD-10-CM | POA: Diagnosis not present

## 2022-08-17 DIAGNOSIS — Z7901 Long term (current) use of anticoagulants: Secondary | ICD-10-CM | POA: Diagnosis not present

## 2022-08-17 DIAGNOSIS — J449 Chronic obstructive pulmonary disease, unspecified: Secondary | ICD-10-CM | POA: Diagnosis not present

## 2022-08-17 DIAGNOSIS — T826XXA Infection and inflammatory reaction due to cardiac valve prosthesis, initial encounter: Secondary | ICD-10-CM | POA: Diagnosis not present

## 2022-08-17 DIAGNOSIS — A419 Sepsis, unspecified organism: Secondary | ICD-10-CM | POA: Diagnosis not present

## 2022-08-17 DIAGNOSIS — I480 Paroxysmal atrial fibrillation: Secondary | ICD-10-CM | POA: Diagnosis not present

## 2022-08-17 DIAGNOSIS — F0283 Dementia in other diseases classified elsewhere, unspecified severity, with mood disturbance: Secondary | ICD-10-CM | POA: Diagnosis not present

## 2022-08-17 DIAGNOSIS — Z8616 Personal history of COVID-19: Secondary | ICD-10-CM | POA: Diagnosis not present

## 2022-08-17 DIAGNOSIS — M1612 Unilateral primary osteoarthritis, left hip: Secondary | ICD-10-CM | POA: Diagnosis not present

## 2022-08-17 DIAGNOSIS — F32A Depression, unspecified: Secondary | ICD-10-CM | POA: Diagnosis not present

## 2022-08-18 ENCOUNTER — Telehealth (HOSPITAL_BASED_OUTPATIENT_CLINIC_OR_DEPARTMENT_OTHER): Payer: Self-pay | Admitting: Cardiovascular Disease

## 2022-08-18 NOTE — Telephone Encounter (Signed)
Pt c/o medication issue:  1. Name of Medication:   sacubitril-valsartan (ENTRESTO) 97-103 MG  2. How are you currently taking this medication (dosage and times per day)?   As prescribed  3. Are you having a reaction (difficulty breathing--STAT)?   No  4. What is your medication issue?   Patient stated she has 7 days left of this medication and had been getting this medication from the company.

## 2022-08-18 NOTE — Telephone Encounter (Signed)
Spoke with patient and she tried to call in her refill, number not valid. Called [phone] spoke with Viviano Simas, reordered for patient  Advised patient should be shipped tomorrow and arrive Tuesday  Patient appreciated the help

## 2022-08-21 DIAGNOSIS — E039 Hypothyroidism, unspecified: Secondary | ICD-10-CM | POA: Diagnosis not present

## 2022-08-21 DIAGNOSIS — M1612 Unilateral primary osteoarthritis, left hip: Secondary | ICD-10-CM | POA: Diagnosis not present

## 2022-08-21 DIAGNOSIS — Z7984 Long term (current) use of oral hypoglycemic drugs: Secondary | ICD-10-CM | POA: Diagnosis not present

## 2022-08-21 DIAGNOSIS — E1140 Type 2 diabetes mellitus with diabetic neuropathy, unspecified: Secondary | ICD-10-CM | POA: Diagnosis not present

## 2022-08-21 DIAGNOSIS — F0284 Dementia in other diseases classified elsewhere, unspecified severity, with anxiety: Secondary | ICD-10-CM | POA: Diagnosis not present

## 2022-08-21 DIAGNOSIS — Z7901 Long term (current) use of anticoagulants: Secondary | ICD-10-CM | POA: Diagnosis not present

## 2022-08-21 DIAGNOSIS — I480 Paroxysmal atrial fibrillation: Secondary | ICD-10-CM | POA: Diagnosis not present

## 2022-08-21 DIAGNOSIS — A419 Sepsis, unspecified organism: Secondary | ICD-10-CM | POA: Diagnosis not present

## 2022-08-21 DIAGNOSIS — K219 Gastro-esophageal reflux disease without esophagitis: Secondary | ICD-10-CM | POA: Diagnosis not present

## 2022-08-21 DIAGNOSIS — Z86711 Personal history of pulmonary embolism: Secondary | ICD-10-CM | POA: Diagnosis not present

## 2022-08-21 DIAGNOSIS — I5033 Acute on chronic diastolic (congestive) heart failure: Secondary | ICD-10-CM | POA: Diagnosis not present

## 2022-08-21 DIAGNOSIS — Z556 Problems related to health literacy: Secondary | ICD-10-CM | POA: Diagnosis not present

## 2022-08-21 DIAGNOSIS — E876 Hypokalemia: Secondary | ICD-10-CM | POA: Diagnosis not present

## 2022-08-21 DIAGNOSIS — Z8616 Personal history of COVID-19: Secondary | ICD-10-CM | POA: Diagnosis not present

## 2022-08-21 DIAGNOSIS — I110 Hypertensive heart disease with heart failure: Secondary | ICD-10-CM | POA: Diagnosis not present

## 2022-08-21 DIAGNOSIS — I358 Other nonrheumatic aortic valve disorders: Secondary | ICD-10-CM | POA: Diagnosis not present

## 2022-08-21 DIAGNOSIS — I5022 Chronic systolic (congestive) heart failure: Secondary | ICD-10-CM | POA: Diagnosis not present

## 2022-08-21 DIAGNOSIS — F0283 Dementia in other diseases classified elsewhere, unspecified severity, with mood disturbance: Secondary | ICD-10-CM | POA: Diagnosis not present

## 2022-08-21 DIAGNOSIS — F32A Depression, unspecified: Secondary | ICD-10-CM | POA: Diagnosis not present

## 2022-08-21 DIAGNOSIS — F02818 Dementia in other diseases classified elsewhere, unspecified severity, with other behavioral disturbance: Secondary | ICD-10-CM | POA: Diagnosis not present

## 2022-08-21 DIAGNOSIS — I4892 Unspecified atrial flutter: Secondary | ICD-10-CM | POA: Diagnosis not present

## 2022-08-21 DIAGNOSIS — J449 Chronic obstructive pulmonary disease, unspecified: Secondary | ICD-10-CM | POA: Diagnosis not present

## 2022-08-21 DIAGNOSIS — T826XXA Infection and inflammatory reaction due to cardiac valve prosthesis, initial encounter: Secondary | ICD-10-CM | POA: Diagnosis not present

## 2022-08-21 DIAGNOSIS — I442 Atrioventricular block, complete: Secondary | ICD-10-CM | POA: Diagnosis not present

## 2022-08-22 ENCOUNTER — Ambulatory Visit (INDEPENDENT_AMBULATORY_CARE_PROVIDER_SITE_OTHER): Payer: Medicare Other

## 2022-08-22 DIAGNOSIS — I442 Atrioventricular block, complete: Secondary | ICD-10-CM | POA: Diagnosis not present

## 2022-08-22 LAB — CUP PACEART REMOTE DEVICE CHECK
Battery Impedance: 1670 Ohm
Battery Remaining Longevity: 41 mo
Battery Voltage: 2.77 V
Brady Statistic AP VP Percent: 12 %
Brady Statistic AP VS Percent: 0 %
Brady Statistic AS VP Percent: 88 %
Brady Statistic AS VS Percent: 0 %
Date Time Interrogation Session: 20240521135125
Implantable Lead Connection Status: 753985
Implantable Lead Connection Status: 753985
Implantable Lead Implant Date: 20140710
Implantable Lead Implant Date: 20140710
Implantable Lead Location: 753859
Implantable Lead Location: 753860
Implantable Lead Model: 5092
Implantable Lead Model: 5592
Implantable Pulse Generator Implant Date: 20140710
Lead Channel Impedance Value: 511 Ohm
Lead Channel Impedance Value: 724 Ohm
Lead Channel Pacing Threshold Amplitude: 0.5 V
Lead Channel Pacing Threshold Amplitude: 0.625 V
Lead Channel Pacing Threshold Pulse Width: 0.4 ms
Lead Channel Pacing Threshold Pulse Width: 0.4 ms
Lead Channel Setting Pacing Amplitude: 2 V
Lead Channel Setting Pacing Amplitude: 2.5 V
Lead Channel Setting Pacing Pulse Width: 0.4 ms
Lead Channel Setting Sensing Sensitivity: 4 mV
Zone Setting Status: 755011
Zone Setting Status: 755011

## 2022-08-23 DIAGNOSIS — J449 Chronic obstructive pulmonary disease, unspecified: Secondary | ICD-10-CM | POA: Diagnosis not present

## 2022-08-23 DIAGNOSIS — Z86711 Personal history of pulmonary embolism: Secondary | ICD-10-CM | POA: Diagnosis not present

## 2022-08-23 DIAGNOSIS — E876 Hypokalemia: Secondary | ICD-10-CM | POA: Diagnosis not present

## 2022-08-23 DIAGNOSIS — E039 Hypothyroidism, unspecified: Secondary | ICD-10-CM | POA: Diagnosis not present

## 2022-08-23 DIAGNOSIS — Z7984 Long term (current) use of oral hypoglycemic drugs: Secondary | ICD-10-CM | POA: Diagnosis not present

## 2022-08-23 DIAGNOSIS — F32A Depression, unspecified: Secondary | ICD-10-CM | POA: Diagnosis not present

## 2022-08-23 DIAGNOSIS — I358 Other nonrheumatic aortic valve disorders: Secondary | ICD-10-CM | POA: Diagnosis not present

## 2022-08-23 DIAGNOSIS — E1140 Type 2 diabetes mellitus with diabetic neuropathy, unspecified: Secondary | ICD-10-CM | POA: Diagnosis not present

## 2022-08-23 DIAGNOSIS — I5033 Acute on chronic diastolic (congestive) heart failure: Secondary | ICD-10-CM | POA: Diagnosis not present

## 2022-08-23 DIAGNOSIS — I110 Hypertensive heart disease with heart failure: Secondary | ICD-10-CM | POA: Diagnosis not present

## 2022-08-23 DIAGNOSIS — I4892 Unspecified atrial flutter: Secondary | ICD-10-CM | POA: Diagnosis not present

## 2022-08-23 DIAGNOSIS — A419 Sepsis, unspecified organism: Secondary | ICD-10-CM | POA: Diagnosis not present

## 2022-08-23 DIAGNOSIS — I5022 Chronic systolic (congestive) heart failure: Secondary | ICD-10-CM | POA: Diagnosis not present

## 2022-08-23 DIAGNOSIS — Z8616 Personal history of COVID-19: Secondary | ICD-10-CM | POA: Diagnosis not present

## 2022-08-23 DIAGNOSIS — M1612 Unilateral primary osteoarthritis, left hip: Secondary | ICD-10-CM | POA: Diagnosis not present

## 2022-08-23 DIAGNOSIS — T826XXA Infection and inflammatory reaction due to cardiac valve prosthesis, initial encounter: Secondary | ICD-10-CM | POA: Diagnosis not present

## 2022-08-23 DIAGNOSIS — K219 Gastro-esophageal reflux disease without esophagitis: Secondary | ICD-10-CM | POA: Diagnosis not present

## 2022-08-23 DIAGNOSIS — F0283 Dementia in other diseases classified elsewhere, unspecified severity, with mood disturbance: Secondary | ICD-10-CM | POA: Diagnosis not present

## 2022-08-23 DIAGNOSIS — I480 Paroxysmal atrial fibrillation: Secondary | ICD-10-CM | POA: Diagnosis not present

## 2022-08-23 DIAGNOSIS — F02818 Dementia in other diseases classified elsewhere, unspecified severity, with other behavioral disturbance: Secondary | ICD-10-CM | POA: Diagnosis not present

## 2022-08-23 DIAGNOSIS — Z556 Problems related to health literacy: Secondary | ICD-10-CM | POA: Diagnosis not present

## 2022-08-23 DIAGNOSIS — I442 Atrioventricular block, complete: Secondary | ICD-10-CM | POA: Diagnosis not present

## 2022-08-23 DIAGNOSIS — F0284 Dementia in other diseases classified elsewhere, unspecified severity, with anxiety: Secondary | ICD-10-CM | POA: Diagnosis not present

## 2022-08-23 DIAGNOSIS — Z7901 Long term (current) use of anticoagulants: Secondary | ICD-10-CM | POA: Diagnosis not present

## 2022-08-24 ENCOUNTER — Ambulatory Visit: Payer: Self-pay

## 2022-08-24 NOTE — Patient Instructions (Addendum)
Visit Information  Thank you for taking time to visit with me today. Please don't hesitate to contact me if I can be of assistance to you.   Following are the goals we discussed today:  Continue to take medications as prescribed. Continue to attend provider visits as scheduled Continue to eat healthy, lean meats, vegetables, fruits, avoid saturated and transfats Continue to monitor for signs/symptoms of HF exacerbation and notify provider with questions or concerns  Our next appointment is by telephone on 09/25/22 at 10:30 am  Please call the care guide team at [phone] if you need to cancel or reschedule your appointment.   If you are experiencing a Mental Health or Behavioral Health Crisis or need someone to talk to, please call the Suicide and Crisis Lifeline: 38  [name], RN, MSN, BSN, CCM [facility] Care Coordinator [phone]

## 2022-08-24 NOTE — Patient Outreach (Signed)
  Care Coordination   Follow Up Visit Note   08/24/2022 Name: Jessica [name] MRN: [id number] DOB: [birth year]  Jessica [name] is a 89 y.o. year old female who sees Plotnikov, Georgina Quint, MD for primary care. I spoke with  Jessica [name] by phone today.  What matters to the patients health and wellness today? Jessica [name] reports she is doing better. Office visit with PCP completed on 08/14/22. Jessica [name] reports cardiology office and clinical pharmacist is working with her and her niece to obtain assistance for Jessica [name] and Jessica [name]. She states that although she has samples, she is not going to start Jessica [name] until she knows that she will be able to get assistance for the medication because she does not want to start a medication that she may have to change later. Patient is taking ASA 162 mg daily per PCP recommendation.  Goals Addressed             This Visit's Progress    Care Coordination-assist with health management       Interventions Today    Flowsheet Row Most Recent Value  Chronic Disease   Chronic disease during today's visit Congestive Heart Failure (CHF)  General Interventions   General Interventions Discussed/Reviewed General Interventions Reviewed, Doctor Visits, Communication with  Doctor Visits Discussed/Reviewed Doctor Visits Discussed  [upcoming appointments reviewed with patient]  Communication with PCP/Specialists  [sent message to Jessica Shields, NP cardiology to update.]  Exercise Interventions   Exercise Discussed/Reviewed --  [encouraged to continue exercise previously provided by therapist. encouraged to remain as active as tolerated]  Education Interventions   Education Provided Provided Education  Provided Verbal Education On Medication, Other  [discussed importance of taking Jessica [name] in prevention of conditions such as stroke. encouraged to take medications as prescribed. advised to contact provider with health questions or concerns.]  Nutrition  Interventions   Nutrition Discussed/Reviewed Nutrition Reviewed  [encouraged to continue to eat healthy]  Pharmacy Interventions   Pharmacy Dicussed/Reviewed Pharmacy Topics Reviewed  [medications reviewed with patient]  Safety Interventions   Safety Discussed/Reviewed Fall Risk, Safety Reviewed  [fall prevention strategies reviewed]            SDOH assessments and interventions completed:  No  Care Coordination Interventions:  Yes, provided   Follow up plan: Follow up call scheduled for 09/25/22    Encounter Outcome:  Pt. Visit Completed   Jessica Sheriff, RN, MSN, BSN, CCM [facility] Care Coordinator [phone]

## 2022-08-27 DIAGNOSIS — E876 Hypokalemia: Secondary | ICD-10-CM | POA: Diagnosis not present

## 2022-08-27 DIAGNOSIS — Z7984 Long term (current) use of oral hypoglycemic drugs: Secondary | ICD-10-CM | POA: Diagnosis not present

## 2022-08-27 DIAGNOSIS — A419 Sepsis, unspecified organism: Secondary | ICD-10-CM | POA: Diagnosis not present

## 2022-08-27 DIAGNOSIS — E1140 Type 2 diabetes mellitus with diabetic neuropathy, unspecified: Secondary | ICD-10-CM | POA: Diagnosis not present

## 2022-08-27 DIAGNOSIS — F02818 Dementia in other diseases classified elsewhere, unspecified severity, with other behavioral disturbance: Secondary | ICD-10-CM | POA: Diagnosis not present

## 2022-08-27 DIAGNOSIS — I5033 Acute on chronic diastolic (congestive) heart failure: Secondary | ICD-10-CM | POA: Diagnosis not present

## 2022-08-27 DIAGNOSIS — T826XXA Infection and inflammatory reaction due to cardiac valve prosthesis, initial encounter: Secondary | ICD-10-CM | POA: Diagnosis not present

## 2022-08-27 DIAGNOSIS — J449 Chronic obstructive pulmonary disease, unspecified: Secondary | ICD-10-CM | POA: Diagnosis not present

## 2022-08-27 DIAGNOSIS — F0284 Dementia in other diseases classified elsewhere, unspecified severity, with anxiety: Secondary | ICD-10-CM | POA: Diagnosis not present

## 2022-08-27 DIAGNOSIS — I442 Atrioventricular block, complete: Secondary | ICD-10-CM | POA: Diagnosis not present

## 2022-08-27 DIAGNOSIS — K219 Gastro-esophageal reflux disease without esophagitis: Secondary | ICD-10-CM | POA: Diagnosis not present

## 2022-08-27 DIAGNOSIS — Z8616 Personal history of COVID-19: Secondary | ICD-10-CM | POA: Diagnosis not present

## 2022-08-27 DIAGNOSIS — I358 Other nonrheumatic aortic valve disorders: Secondary | ICD-10-CM | POA: Diagnosis not present

## 2022-08-27 DIAGNOSIS — F32A Depression, unspecified: Secondary | ICD-10-CM | POA: Diagnosis not present

## 2022-08-27 DIAGNOSIS — M1612 Unilateral primary osteoarthritis, left hip: Secondary | ICD-10-CM | POA: Diagnosis not present

## 2022-08-27 DIAGNOSIS — Z7901 Long term (current) use of anticoagulants: Secondary | ICD-10-CM | POA: Diagnosis not present

## 2022-08-27 DIAGNOSIS — I4892 Unspecified atrial flutter: Secondary | ICD-10-CM | POA: Diagnosis not present

## 2022-08-27 DIAGNOSIS — Z86711 Personal history of pulmonary embolism: Secondary | ICD-10-CM | POA: Diagnosis not present

## 2022-08-27 DIAGNOSIS — F0283 Dementia in other diseases classified elsewhere, unspecified severity, with mood disturbance: Secondary | ICD-10-CM | POA: Diagnosis not present

## 2022-08-27 DIAGNOSIS — Z556 Problems related to health literacy: Secondary | ICD-10-CM | POA: Diagnosis not present

## 2022-08-27 DIAGNOSIS — I5022 Chronic systolic (congestive) heart failure: Secondary | ICD-10-CM | POA: Diagnosis not present

## 2022-08-27 DIAGNOSIS — I480 Paroxysmal atrial fibrillation: Secondary | ICD-10-CM | POA: Diagnosis not present

## 2022-08-27 DIAGNOSIS — E039 Hypothyroidism, unspecified: Secondary | ICD-10-CM | POA: Diagnosis not present

## 2022-08-27 DIAGNOSIS — I110 Hypertensive heart disease with heart failure: Secondary | ICD-10-CM | POA: Diagnosis not present

## 2022-08-29 ENCOUNTER — Telehealth: Payer: Self-pay | Admitting: Internal Medicine

## 2022-08-29 DIAGNOSIS — T826XXA Infection and inflammatory reaction due to cardiac valve prosthesis, initial encounter: Secondary | ICD-10-CM | POA: Diagnosis not present

## 2022-08-29 DIAGNOSIS — I480 Paroxysmal atrial fibrillation: Secondary | ICD-10-CM | POA: Diagnosis not present

## 2022-08-29 DIAGNOSIS — F0283 Dementia in other diseases classified elsewhere, unspecified severity, with mood disturbance: Secondary | ICD-10-CM | POA: Diagnosis not present

## 2022-08-29 DIAGNOSIS — I5033 Acute on chronic diastolic (congestive) heart failure: Secondary | ICD-10-CM | POA: Diagnosis not present

## 2022-08-29 DIAGNOSIS — E876 Hypokalemia: Secondary | ICD-10-CM | POA: Diagnosis not present

## 2022-08-29 DIAGNOSIS — K219 Gastro-esophageal reflux disease without esophagitis: Secondary | ICD-10-CM | POA: Diagnosis not present

## 2022-08-29 DIAGNOSIS — Z7984 Long term (current) use of oral hypoglycemic drugs: Secondary | ICD-10-CM | POA: Diagnosis not present

## 2022-08-29 DIAGNOSIS — I442 Atrioventricular block, complete: Secondary | ICD-10-CM | POA: Diagnosis not present

## 2022-08-29 DIAGNOSIS — I4892 Unspecified atrial flutter: Secondary | ICD-10-CM | POA: Diagnosis not present

## 2022-08-29 DIAGNOSIS — Z556 Problems related to health literacy: Secondary | ICD-10-CM | POA: Diagnosis not present

## 2022-08-29 DIAGNOSIS — F32A Depression, unspecified: Secondary | ICD-10-CM | POA: Diagnosis not present

## 2022-08-29 DIAGNOSIS — F0284 Dementia in other diseases classified elsewhere, unspecified severity, with anxiety: Secondary | ICD-10-CM | POA: Diagnosis not present

## 2022-08-29 DIAGNOSIS — J449 Chronic obstructive pulmonary disease, unspecified: Secondary | ICD-10-CM | POA: Diagnosis not present

## 2022-08-29 DIAGNOSIS — E1140 Type 2 diabetes mellitus with diabetic neuropathy, unspecified: Secondary | ICD-10-CM | POA: Diagnosis not present

## 2022-08-29 DIAGNOSIS — Z86711 Personal history of pulmonary embolism: Secondary | ICD-10-CM | POA: Diagnosis not present

## 2022-08-29 DIAGNOSIS — I110 Hypertensive heart disease with heart failure: Secondary | ICD-10-CM | POA: Diagnosis not present

## 2022-08-29 DIAGNOSIS — Z8616 Personal history of COVID-19: Secondary | ICD-10-CM | POA: Diagnosis not present

## 2022-08-29 DIAGNOSIS — Z7901 Long term (current) use of anticoagulants: Secondary | ICD-10-CM | POA: Diagnosis not present

## 2022-08-29 DIAGNOSIS — I358 Other nonrheumatic aortic valve disorders: Secondary | ICD-10-CM | POA: Diagnosis not present

## 2022-08-29 DIAGNOSIS — M1612 Unilateral primary osteoarthritis, left hip: Secondary | ICD-10-CM | POA: Diagnosis not present

## 2022-08-29 DIAGNOSIS — F02818 Dementia in other diseases classified elsewhere, unspecified severity, with other behavioral disturbance: Secondary | ICD-10-CM | POA: Diagnosis not present

## 2022-08-29 DIAGNOSIS — E039 Hypothyroidism, unspecified: Secondary | ICD-10-CM | POA: Diagnosis not present

## 2022-08-29 DIAGNOSIS — A419 Sepsis, unspecified organism: Secondary | ICD-10-CM | POA: Diagnosis not present

## 2022-08-29 DIAGNOSIS — I5022 Chronic systolic (congestive) heart failure: Secondary | ICD-10-CM | POA: Diagnosis not present

## 2022-08-29 NOTE — Telephone Encounter (Signed)
Caller [employer] What [employer]:  Vernona Rieger from [facility]   Phone Number: [phone]  Needs Verbal orders for what service & frequency:recertification for nusing - 1 week for 9 weeks

## 2022-08-29 NOTE — Telephone Encounter (Signed)
Called [facility] ok verbal for PT. Pt is compliance with appts.Marland KitchenRaechel Chute

## 2022-08-29 NOTE — Telephone Encounter (Signed)
Caller [employer] What [employer]:  Mare Loan from [facility]   Phone Number:  [phone]   Needs Verbal orders for what service & frequency:  Patient needs physical therapy recertification one time a week for 6 weeks for strenghtening, gait training, balance

## 2022-08-31 DIAGNOSIS — K219 Gastro-esophageal reflux disease without esophagitis: Secondary | ICD-10-CM | POA: Diagnosis not present

## 2022-08-31 DIAGNOSIS — I110 Hypertensive heart disease with heart failure: Secondary | ICD-10-CM | POA: Diagnosis not present

## 2022-08-31 DIAGNOSIS — I358 Other nonrheumatic aortic valve disorders: Secondary | ICD-10-CM | POA: Diagnosis not present

## 2022-08-31 DIAGNOSIS — E876 Hypokalemia: Secondary | ICD-10-CM | POA: Diagnosis not present

## 2022-08-31 DIAGNOSIS — I442 Atrioventricular block, complete: Secondary | ICD-10-CM | POA: Diagnosis not present

## 2022-08-31 DIAGNOSIS — F0284 Dementia in other diseases classified elsewhere, unspecified severity, with anxiety: Secondary | ICD-10-CM | POA: Diagnosis not present

## 2022-08-31 DIAGNOSIS — F0283 Dementia in other diseases classified elsewhere, unspecified severity, with mood disturbance: Secondary | ICD-10-CM | POA: Diagnosis not present

## 2022-08-31 DIAGNOSIS — F02818 Dementia in other diseases classified elsewhere, unspecified severity, with other behavioral disturbance: Secondary | ICD-10-CM | POA: Diagnosis not present

## 2022-08-31 DIAGNOSIS — I4892 Unspecified atrial flutter: Secondary | ICD-10-CM | POA: Diagnosis not present

## 2022-08-31 DIAGNOSIS — F32A Depression, unspecified: Secondary | ICD-10-CM | POA: Diagnosis not present

## 2022-08-31 DIAGNOSIS — Z8616 Personal history of COVID-19: Secondary | ICD-10-CM | POA: Diagnosis not present

## 2022-08-31 DIAGNOSIS — Z7901 Long term (current) use of anticoagulants: Secondary | ICD-10-CM | POA: Diagnosis not present

## 2022-08-31 DIAGNOSIS — I5022 Chronic systolic (congestive) heart failure: Secondary | ICD-10-CM | POA: Diagnosis not present

## 2022-08-31 DIAGNOSIS — E039 Hypothyroidism, unspecified: Secondary | ICD-10-CM | POA: Diagnosis not present

## 2022-08-31 DIAGNOSIS — J449 Chronic obstructive pulmonary disease, unspecified: Secondary | ICD-10-CM | POA: Diagnosis not present

## 2022-08-31 DIAGNOSIS — Z7984 Long term (current) use of oral hypoglycemic drugs: Secondary | ICD-10-CM | POA: Diagnosis not present

## 2022-08-31 DIAGNOSIS — M1612 Unilateral primary osteoarthritis, left hip: Secondary | ICD-10-CM | POA: Diagnosis not present

## 2022-08-31 DIAGNOSIS — I5033 Acute on chronic diastolic (congestive) heart failure: Secondary | ICD-10-CM | POA: Diagnosis not present

## 2022-08-31 DIAGNOSIS — I480 Paroxysmal atrial fibrillation: Secondary | ICD-10-CM | POA: Diagnosis not present

## 2022-08-31 DIAGNOSIS — A419 Sepsis, unspecified organism: Secondary | ICD-10-CM | POA: Diagnosis not present

## 2022-08-31 DIAGNOSIS — E1140 Type 2 diabetes mellitus with diabetic neuropathy, unspecified: Secondary | ICD-10-CM | POA: Diagnosis not present

## 2022-08-31 DIAGNOSIS — Z556 Problems related to health literacy: Secondary | ICD-10-CM | POA: Diagnosis not present

## 2022-08-31 DIAGNOSIS — Z86711 Personal history of pulmonary embolism: Secondary | ICD-10-CM | POA: Diagnosis not present

## 2022-08-31 DIAGNOSIS — T826XXA Infection and inflammatory reaction due to cardiac valve prosthesis, initial encounter: Secondary | ICD-10-CM | POA: Diagnosis not present

## 2022-09-04 DIAGNOSIS — F0283 Dementia in other diseases classified elsewhere, unspecified severity, with mood disturbance: Secondary | ICD-10-CM | POA: Diagnosis not present

## 2022-09-04 DIAGNOSIS — Z86711 Personal history of pulmonary embolism: Secondary | ICD-10-CM | POA: Diagnosis not present

## 2022-09-04 DIAGNOSIS — I442 Atrioventricular block, complete: Secondary | ICD-10-CM | POA: Diagnosis not present

## 2022-09-04 DIAGNOSIS — Z950 Presence of cardiac pacemaker: Secondary | ICD-10-CM | POA: Diagnosis not present

## 2022-09-04 DIAGNOSIS — I4892 Unspecified atrial flutter: Secondary | ICD-10-CM | POA: Diagnosis not present

## 2022-09-04 DIAGNOSIS — E039 Hypothyroidism, unspecified: Secondary | ICD-10-CM | POA: Diagnosis not present

## 2022-09-04 DIAGNOSIS — F32A Depression, unspecified: Secondary | ICD-10-CM | POA: Diagnosis not present

## 2022-09-04 DIAGNOSIS — Z7982 Long term (current) use of aspirin: Secondary | ICD-10-CM | POA: Diagnosis not present

## 2022-09-04 DIAGNOSIS — J449 Chronic obstructive pulmonary disease, unspecified: Secondary | ICD-10-CM | POA: Diagnosis not present

## 2022-09-04 DIAGNOSIS — I358 Other nonrheumatic aortic valve disorders: Secondary | ICD-10-CM | POA: Diagnosis not present

## 2022-09-04 DIAGNOSIS — Z7984 Long term (current) use of oral hypoglycemic drugs: Secondary | ICD-10-CM | POA: Diagnosis not present

## 2022-09-04 DIAGNOSIS — I5033 Acute on chronic diastolic (congestive) heart failure: Secondary | ICD-10-CM | POA: Diagnosis not present

## 2022-09-04 DIAGNOSIS — M1612 Unilateral primary osteoarthritis, left hip: Secondary | ICD-10-CM | POA: Diagnosis not present

## 2022-09-04 DIAGNOSIS — I5022 Chronic systolic (congestive) heart failure: Secondary | ICD-10-CM | POA: Diagnosis not present

## 2022-09-04 DIAGNOSIS — Z7901 Long term (current) use of anticoagulants: Secondary | ICD-10-CM | POA: Diagnosis not present

## 2022-09-04 DIAGNOSIS — I480 Paroxysmal atrial fibrillation: Secondary | ICD-10-CM | POA: Diagnosis not present

## 2022-09-04 DIAGNOSIS — K219 Gastro-esophageal reflux disease without esophagitis: Secondary | ICD-10-CM | POA: Diagnosis not present

## 2022-09-04 DIAGNOSIS — F02818 Dementia in other diseases classified elsewhere, unspecified severity, with other behavioral disturbance: Secondary | ICD-10-CM | POA: Diagnosis not present

## 2022-09-04 DIAGNOSIS — Z792 Long term (current) use of antibiotics: Secondary | ICD-10-CM | POA: Diagnosis not present

## 2022-09-04 DIAGNOSIS — E1140 Type 2 diabetes mellitus with diabetic neuropathy, unspecified: Secondary | ICD-10-CM | POA: Diagnosis not present

## 2022-09-04 DIAGNOSIS — F0284 Dementia in other diseases classified elsewhere, unspecified severity, with anxiety: Secondary | ICD-10-CM | POA: Diagnosis not present

## 2022-09-04 DIAGNOSIS — Z8616 Personal history of COVID-19: Secondary | ICD-10-CM | POA: Diagnosis not present

## 2022-09-04 DIAGNOSIS — I110 Hypertensive heart disease with heart failure: Secondary | ICD-10-CM | POA: Diagnosis not present

## 2022-09-05 ENCOUNTER — Telehealth: Payer: Self-pay | Admitting: Internal Medicine

## 2022-09-05 DIAGNOSIS — F32A Depression, unspecified: Secondary | ICD-10-CM | POA: Diagnosis not present

## 2022-09-05 DIAGNOSIS — F0283 Dementia in other diseases classified elsewhere, unspecified severity, with mood disturbance: Secondary | ICD-10-CM | POA: Diagnosis not present

## 2022-09-05 DIAGNOSIS — E1140 Type 2 diabetes mellitus with diabetic neuropathy, unspecified: Secondary | ICD-10-CM | POA: Diagnosis not present

## 2022-09-05 DIAGNOSIS — Z7982 Long term (current) use of aspirin: Secondary | ICD-10-CM | POA: Diagnosis not present

## 2022-09-05 DIAGNOSIS — Z7901 Long term (current) use of anticoagulants: Secondary | ICD-10-CM | POA: Diagnosis not present

## 2022-09-05 DIAGNOSIS — Z86711 Personal history of pulmonary embolism: Secondary | ICD-10-CM | POA: Diagnosis not present

## 2022-09-05 DIAGNOSIS — E039 Hypothyroidism, unspecified: Secondary | ICD-10-CM | POA: Diagnosis not present

## 2022-09-05 DIAGNOSIS — I110 Hypertensive heart disease with heart failure: Secondary | ICD-10-CM | POA: Diagnosis not present

## 2022-09-05 DIAGNOSIS — Z950 Presence of cardiac pacemaker: Secondary | ICD-10-CM | POA: Diagnosis not present

## 2022-09-05 DIAGNOSIS — K219 Gastro-esophageal reflux disease without esophagitis: Secondary | ICD-10-CM | POA: Diagnosis not present

## 2022-09-05 DIAGNOSIS — Z7984 Long term (current) use of oral hypoglycemic drugs: Secondary | ICD-10-CM | POA: Diagnosis not present

## 2022-09-05 DIAGNOSIS — Z792 Long term (current) use of antibiotics: Secondary | ICD-10-CM | POA: Diagnosis not present

## 2022-09-05 DIAGNOSIS — I442 Atrioventricular block, complete: Secondary | ICD-10-CM | POA: Diagnosis not present

## 2022-09-05 DIAGNOSIS — F0284 Dementia in other diseases classified elsewhere, unspecified severity, with anxiety: Secondary | ICD-10-CM | POA: Diagnosis not present

## 2022-09-05 DIAGNOSIS — Z8616 Personal history of COVID-19: Secondary | ICD-10-CM | POA: Diagnosis not present

## 2022-09-05 DIAGNOSIS — J449 Chronic obstructive pulmonary disease, unspecified: Secondary | ICD-10-CM | POA: Diagnosis not present

## 2022-09-05 DIAGNOSIS — I480 Paroxysmal atrial fibrillation: Secondary | ICD-10-CM | POA: Diagnosis not present

## 2022-09-05 DIAGNOSIS — I358 Other nonrheumatic aortic valve disorders: Secondary | ICD-10-CM | POA: Diagnosis not present

## 2022-09-05 DIAGNOSIS — F02818 Dementia in other diseases classified elsewhere, unspecified severity, with other behavioral disturbance: Secondary | ICD-10-CM | POA: Diagnosis not present

## 2022-09-05 DIAGNOSIS — M1612 Unilateral primary osteoarthritis, left hip: Secondary | ICD-10-CM | POA: Diagnosis not present

## 2022-09-05 DIAGNOSIS — I5033 Acute on chronic diastolic (congestive) heart failure: Secondary | ICD-10-CM | POA: Diagnosis not present

## 2022-09-05 DIAGNOSIS — I4892 Unspecified atrial flutter: Secondary | ICD-10-CM | POA: Diagnosis not present

## 2022-09-05 DIAGNOSIS — I5022 Chronic systolic (congestive) heart failure: Secondary | ICD-10-CM | POA: Diagnosis not present

## 2022-09-05 NOTE — Telephone Encounter (Signed)
Notified Jessica [name] w/ MD response.Marland KitchenRaechel Chute

## 2022-09-05 NOTE — Telephone Encounter (Signed)
Patient dropped off document [employer], to be filled out by provider. Patient requested to send it via Fax within 7-days. Document is located in providers tray at front office.Please advise at [facility] [phone]   Fax number is listed at the top of the paperwork along with who to send it to attention too.

## 2022-09-05 NOTE — Telephone Encounter (Signed)
Called pt spoke w/ sister [name]) inform her pt will need to make appt w/ MD. The form is very in dept. Pt trying to get reimburse from PT. Made appt for 09/18/22.Marland KitchenRaechel Chute

## 2022-09-05 NOTE — Telephone Encounter (Signed)
Okay.  Thanks.

## 2022-09-06 DIAGNOSIS — I110 Hypertensive heart disease with heart failure: Secondary | ICD-10-CM | POA: Diagnosis not present

## 2022-09-06 DIAGNOSIS — F02818 Dementia in other diseases classified elsewhere, unspecified severity, with other behavioral disturbance: Secondary | ICD-10-CM | POA: Diagnosis not present

## 2022-09-06 DIAGNOSIS — J449 Chronic obstructive pulmonary disease, unspecified: Secondary | ICD-10-CM | POA: Diagnosis not present

## 2022-09-06 DIAGNOSIS — F0284 Dementia in other diseases classified elsewhere, unspecified severity, with anxiety: Secondary | ICD-10-CM | POA: Diagnosis not present

## 2022-09-06 DIAGNOSIS — M1612 Unilateral primary osteoarthritis, left hip: Secondary | ICD-10-CM | POA: Diagnosis not present

## 2022-09-06 DIAGNOSIS — I480 Paroxysmal atrial fibrillation: Secondary | ICD-10-CM | POA: Diagnosis not present

## 2022-09-06 DIAGNOSIS — I4892 Unspecified atrial flutter: Secondary | ICD-10-CM | POA: Diagnosis not present

## 2022-09-06 DIAGNOSIS — K219 Gastro-esophageal reflux disease without esophagitis: Secondary | ICD-10-CM | POA: Diagnosis not present

## 2022-09-06 DIAGNOSIS — F32A Depression, unspecified: Secondary | ICD-10-CM | POA: Diagnosis not present

## 2022-09-06 DIAGNOSIS — Z8616 Personal history of COVID-19: Secondary | ICD-10-CM | POA: Diagnosis not present

## 2022-09-06 DIAGNOSIS — I5033 Acute on chronic diastolic (congestive) heart failure: Secondary | ICD-10-CM | POA: Diagnosis not present

## 2022-09-06 DIAGNOSIS — Z950 Presence of cardiac pacemaker: Secondary | ICD-10-CM | POA: Diagnosis not present

## 2022-09-06 DIAGNOSIS — E1140 Type 2 diabetes mellitus with diabetic neuropathy, unspecified: Secondary | ICD-10-CM | POA: Diagnosis not present

## 2022-09-06 DIAGNOSIS — Z7984 Long term (current) use of oral hypoglycemic drugs: Secondary | ICD-10-CM | POA: Diagnosis not present

## 2022-09-06 DIAGNOSIS — Z86711 Personal history of pulmonary embolism: Secondary | ICD-10-CM | POA: Diagnosis not present

## 2022-09-06 DIAGNOSIS — E039 Hypothyroidism, unspecified: Secondary | ICD-10-CM | POA: Diagnosis not present

## 2022-09-06 DIAGNOSIS — I358 Other nonrheumatic aortic valve disorders: Secondary | ICD-10-CM | POA: Diagnosis not present

## 2022-09-06 DIAGNOSIS — Z7901 Long term (current) use of anticoagulants: Secondary | ICD-10-CM | POA: Diagnosis not present

## 2022-09-06 DIAGNOSIS — Z7982 Long term (current) use of aspirin: Secondary | ICD-10-CM | POA: Diagnosis not present

## 2022-09-06 DIAGNOSIS — Z792 Long term (current) use of antibiotics: Secondary | ICD-10-CM | POA: Diagnosis not present

## 2022-09-06 DIAGNOSIS — I442 Atrioventricular block, complete: Secondary | ICD-10-CM | POA: Diagnosis not present

## 2022-09-06 DIAGNOSIS — F0283 Dementia in other diseases classified elsewhere, unspecified severity, with mood disturbance: Secondary | ICD-10-CM | POA: Diagnosis not present

## 2022-09-06 DIAGNOSIS — I5022 Chronic systolic (congestive) heart failure: Secondary | ICD-10-CM | POA: Diagnosis not present

## 2022-09-11 ENCOUNTER — Telehealth: Payer: Self-pay

## 2022-09-11 DIAGNOSIS — J449 Chronic obstructive pulmonary disease, unspecified: Secondary | ICD-10-CM | POA: Diagnosis not present

## 2022-09-11 DIAGNOSIS — F02818 Dementia in other diseases classified elsewhere, unspecified severity, with other behavioral disturbance: Secondary | ICD-10-CM | POA: Diagnosis not present

## 2022-09-11 DIAGNOSIS — Z792 Long term (current) use of antibiotics: Secondary | ICD-10-CM | POA: Diagnosis not present

## 2022-09-11 DIAGNOSIS — I480 Paroxysmal atrial fibrillation: Secondary | ICD-10-CM | POA: Diagnosis not present

## 2022-09-11 DIAGNOSIS — I110 Hypertensive heart disease with heart failure: Secondary | ICD-10-CM | POA: Diagnosis not present

## 2022-09-11 DIAGNOSIS — Z950 Presence of cardiac pacemaker: Secondary | ICD-10-CM | POA: Diagnosis not present

## 2022-09-11 DIAGNOSIS — F0283 Dementia in other diseases classified elsewhere, unspecified severity, with mood disturbance: Secondary | ICD-10-CM | POA: Diagnosis not present

## 2022-09-11 DIAGNOSIS — I442 Atrioventricular block, complete: Secondary | ICD-10-CM | POA: Diagnosis not present

## 2022-09-11 DIAGNOSIS — Z7982 Long term (current) use of aspirin: Secondary | ICD-10-CM | POA: Diagnosis not present

## 2022-09-11 DIAGNOSIS — K219 Gastro-esophageal reflux disease without esophagitis: Secondary | ICD-10-CM | POA: Diagnosis not present

## 2022-09-11 DIAGNOSIS — I358 Other nonrheumatic aortic valve disorders: Secondary | ICD-10-CM | POA: Diagnosis not present

## 2022-09-11 DIAGNOSIS — F0284 Dementia in other diseases classified elsewhere, unspecified severity, with anxiety: Secondary | ICD-10-CM | POA: Diagnosis not present

## 2022-09-11 DIAGNOSIS — Z86711 Personal history of pulmonary embolism: Secondary | ICD-10-CM | POA: Diagnosis not present

## 2022-09-11 DIAGNOSIS — M1612 Unilateral primary osteoarthritis, left hip: Secondary | ICD-10-CM | POA: Diagnosis not present

## 2022-09-11 DIAGNOSIS — I5022 Chronic systolic (congestive) heart failure: Secondary | ICD-10-CM | POA: Diagnosis not present

## 2022-09-11 DIAGNOSIS — Z7901 Long term (current) use of anticoagulants: Secondary | ICD-10-CM | POA: Diagnosis not present

## 2022-09-11 DIAGNOSIS — E1140 Type 2 diabetes mellitus with diabetic neuropathy, unspecified: Secondary | ICD-10-CM | POA: Diagnosis not present

## 2022-09-11 DIAGNOSIS — F32A Depression, unspecified: Secondary | ICD-10-CM | POA: Diagnosis not present

## 2022-09-11 DIAGNOSIS — Z8616 Personal history of COVID-19: Secondary | ICD-10-CM | POA: Diagnosis not present

## 2022-09-11 DIAGNOSIS — I5033 Acute on chronic diastolic (congestive) heart failure: Secondary | ICD-10-CM | POA: Diagnosis not present

## 2022-09-11 DIAGNOSIS — E039 Hypothyroidism, unspecified: Secondary | ICD-10-CM | POA: Diagnosis not present

## 2022-09-11 DIAGNOSIS — I4892 Unspecified atrial flutter: Secondary | ICD-10-CM | POA: Diagnosis not present

## 2022-09-11 DIAGNOSIS — Z7984 Long term (current) use of oral hypoglycemic drugs: Secondary | ICD-10-CM | POA: Diagnosis not present

## 2022-09-11 NOTE — Progress Notes (Signed)
   09/11/2022  Patient ID: Jessica [name], female   DOB: 1933/09/29, 89 y.o.   MRN: [id number]  Patient outreach to check on LIS status to further determine how pharmacy can assist with medication access.  LCSW assisted with applying 08/14/22.  Unable to reach patient, so HIPAA compliant voicemail was left with my direct number for [name] to return my call at her convenience.    Lenna Gilford, PharmD, DPLA

## 2022-09-13 ENCOUNTER — Telehealth (HOSPITAL_COMMUNITY): Payer: Self-pay | Admitting: Licensed Clinical Social Worker

## 2022-09-13 ENCOUNTER — Ambulatory Visit: Payer: Medicare Other | Admitting: Internal Medicine

## 2022-09-13 DIAGNOSIS — M1612 Unilateral primary osteoarthritis, left hip: Secondary | ICD-10-CM

## 2022-09-13 DIAGNOSIS — F02818 Dementia in other diseases classified elsewhere, unspecified severity, with other behavioral disturbance: Secondary | ICD-10-CM | POA: Diagnosis not present

## 2022-09-13 DIAGNOSIS — F32A Depression, unspecified: Secondary | ICD-10-CM | POA: Diagnosis not present

## 2022-09-13 DIAGNOSIS — F0283 Dementia in other diseases classified elsewhere, unspecified severity, with mood disturbance: Secondary | ICD-10-CM | POA: Diagnosis not present

## 2022-09-13 DIAGNOSIS — I442 Atrioventricular block, complete: Secondary | ICD-10-CM

## 2022-09-13 DIAGNOSIS — K219 Gastro-esophageal reflux disease without esophagitis: Secondary | ICD-10-CM

## 2022-09-13 DIAGNOSIS — E1140 Type 2 diabetes mellitus with diabetic neuropathy, unspecified: Secondary | ICD-10-CM | POA: Diagnosis not present

## 2022-09-13 DIAGNOSIS — I358 Other nonrheumatic aortic valve disorders: Secondary | ICD-10-CM | POA: Diagnosis not present

## 2022-09-13 DIAGNOSIS — I5022 Chronic systolic (congestive) heart failure: Secondary | ICD-10-CM | POA: Diagnosis not present

## 2022-09-13 DIAGNOSIS — Z950 Presence of cardiac pacemaker: Secondary | ICD-10-CM

## 2022-09-13 DIAGNOSIS — I110 Hypertensive heart disease with heart failure: Secondary | ICD-10-CM | POA: Diagnosis not present

## 2022-09-13 DIAGNOSIS — Z9181 History of falling: Secondary | ICD-10-CM

## 2022-09-13 DIAGNOSIS — I480 Paroxysmal atrial fibrillation: Secondary | ICD-10-CM | POA: Diagnosis not present

## 2022-09-13 DIAGNOSIS — I5033 Acute on chronic diastolic (congestive) heart failure: Secondary | ICD-10-CM | POA: Diagnosis not present

## 2022-09-13 DIAGNOSIS — Z7901 Long term (current) use of anticoagulants: Secondary | ICD-10-CM

## 2022-09-13 DIAGNOSIS — Z8616 Personal history of COVID-19: Secondary | ICD-10-CM

## 2022-09-13 DIAGNOSIS — E039 Hypothyroidism, unspecified: Secondary | ICD-10-CM | POA: Diagnosis not present

## 2022-09-13 DIAGNOSIS — I4892 Unspecified atrial flutter: Secondary | ICD-10-CM

## 2022-09-13 DIAGNOSIS — J449 Chronic obstructive pulmonary disease, unspecified: Secondary | ICD-10-CM | POA: Diagnosis not present

## 2022-09-13 DIAGNOSIS — F0284 Dementia in other diseases classified elsewhere, unspecified severity, with anxiety: Secondary | ICD-10-CM | POA: Diagnosis not present

## 2022-09-13 DIAGNOSIS — Z6839 Body mass index (BMI) 39.0-39.9, adult: Secondary | ICD-10-CM

## 2022-09-13 DIAGNOSIS — Z7982 Long term (current) use of aspirin: Secondary | ICD-10-CM

## 2022-09-13 DIAGNOSIS — Z792 Long term (current) use of antibiotics: Secondary | ICD-10-CM

## 2022-09-13 DIAGNOSIS — Z86711 Personal history of pulmonary embolism: Secondary | ICD-10-CM

## 2022-09-13 DIAGNOSIS — Z7984 Long term (current) use of oral hypoglycemic drugs: Secondary | ICD-10-CM

## 2022-09-13 NOTE — Telephone Encounter (Signed)
H&V Care Navigation CSW Progress Note  Clinical Social Worker followed up with pt and niece regarding LIS application.  Reports that pt was denied for LIS and that she has that notice and plans to bring it to the [facility] location next week so we can proceed with patient assistance application for pt.   SDOH Screenings   Food Insecurity: No Food Insecurity (07/04/2022)  Housing: Low Risk  (07/04/2022)  Transportation Needs: No Transportation Needs (07/04/2022)  Utilities: Not At Risk (07/04/2022)  Alcohol Screen: Low Risk  (07/04/2022)  Depression (PHQ2-9): Low Risk  (08/14/2022)  Financial Resource Strain: Low Risk  (07/04/2022)  Physical Activity: Inactive (07/04/2022)  Social Connections: Socially Isolated (07/04/2022)  Stress: No Stress Concern Present (07/04/2022)  Tobacco Use: Low Risk  (08/14/2022)    Burna Sis, LCSW Clinical Social Worker [facility] Desk#: [phone] Cell#: [phone]

## 2022-09-14 DIAGNOSIS — I5022 Chronic systolic (congestive) heart failure: Secondary | ICD-10-CM | POA: Diagnosis not present

## 2022-09-14 DIAGNOSIS — E1140 Type 2 diabetes mellitus with diabetic neuropathy, unspecified: Secondary | ICD-10-CM | POA: Diagnosis not present

## 2022-09-14 DIAGNOSIS — I4892 Unspecified atrial flutter: Secondary | ICD-10-CM | POA: Diagnosis not present

## 2022-09-14 DIAGNOSIS — Z7982 Long term (current) use of aspirin: Secondary | ICD-10-CM | POA: Diagnosis not present

## 2022-09-14 DIAGNOSIS — F0283 Dementia in other diseases classified elsewhere, unspecified severity, with mood disturbance: Secondary | ICD-10-CM | POA: Diagnosis not present

## 2022-09-14 DIAGNOSIS — Z950 Presence of cardiac pacemaker: Secondary | ICD-10-CM | POA: Diagnosis not present

## 2022-09-14 DIAGNOSIS — F32A Depression, unspecified: Secondary | ICD-10-CM | POA: Diagnosis not present

## 2022-09-14 DIAGNOSIS — Z7984 Long term (current) use of oral hypoglycemic drugs: Secondary | ICD-10-CM | POA: Diagnosis not present

## 2022-09-14 DIAGNOSIS — I442 Atrioventricular block, complete: Secondary | ICD-10-CM | POA: Diagnosis not present

## 2022-09-14 DIAGNOSIS — J449 Chronic obstructive pulmonary disease, unspecified: Secondary | ICD-10-CM | POA: Diagnosis not present

## 2022-09-14 DIAGNOSIS — Z8616 Personal history of COVID-19: Secondary | ICD-10-CM | POA: Diagnosis not present

## 2022-09-14 DIAGNOSIS — F0284 Dementia in other diseases classified elsewhere, unspecified severity, with anxiety: Secondary | ICD-10-CM | POA: Diagnosis not present

## 2022-09-14 DIAGNOSIS — Z86711 Personal history of pulmonary embolism: Secondary | ICD-10-CM | POA: Diagnosis not present

## 2022-09-14 DIAGNOSIS — I358 Other nonrheumatic aortic valve disorders: Secondary | ICD-10-CM | POA: Diagnosis not present

## 2022-09-14 DIAGNOSIS — Z792 Long term (current) use of antibiotics: Secondary | ICD-10-CM | POA: Diagnosis not present

## 2022-09-14 DIAGNOSIS — E039 Hypothyroidism, unspecified: Secondary | ICD-10-CM | POA: Diagnosis not present

## 2022-09-14 DIAGNOSIS — K219 Gastro-esophageal reflux disease without esophagitis: Secondary | ICD-10-CM | POA: Diagnosis not present

## 2022-09-14 DIAGNOSIS — I110 Hypertensive heart disease with heart failure: Secondary | ICD-10-CM | POA: Diagnosis not present

## 2022-09-14 DIAGNOSIS — I5033 Acute on chronic diastolic (congestive) heart failure: Secondary | ICD-10-CM | POA: Diagnosis not present

## 2022-09-14 DIAGNOSIS — I480 Paroxysmal atrial fibrillation: Secondary | ICD-10-CM | POA: Diagnosis not present

## 2022-09-14 DIAGNOSIS — Z7901 Long term (current) use of anticoagulants: Secondary | ICD-10-CM | POA: Diagnosis not present

## 2022-09-14 DIAGNOSIS — F02818 Dementia in other diseases classified elsewhere, unspecified severity, with other behavioral disturbance: Secondary | ICD-10-CM | POA: Diagnosis not present

## 2022-09-14 DIAGNOSIS — M1612 Unilateral primary osteoarthritis, left hip: Secondary | ICD-10-CM | POA: Diagnosis not present

## 2022-09-14 NOTE — Progress Notes (Signed)
Remote pacemaker transmission.   

## 2022-09-18 DIAGNOSIS — Z7984 Long term (current) use of oral hypoglycemic drugs: Secondary | ICD-10-CM | POA: Diagnosis not present

## 2022-09-18 DIAGNOSIS — F32A Depression, unspecified: Secondary | ICD-10-CM | POA: Diagnosis not present

## 2022-09-18 DIAGNOSIS — M1612 Unilateral primary osteoarthritis, left hip: Secondary | ICD-10-CM | POA: Diagnosis not present

## 2022-09-18 DIAGNOSIS — F02818 Dementia in other diseases classified elsewhere, unspecified severity, with other behavioral disturbance: Secondary | ICD-10-CM | POA: Diagnosis not present

## 2022-09-18 DIAGNOSIS — I5022 Chronic systolic (congestive) heart failure: Secondary | ICD-10-CM | POA: Diagnosis not present

## 2022-09-18 DIAGNOSIS — I110 Hypertensive heart disease with heart failure: Secondary | ICD-10-CM | POA: Diagnosis not present

## 2022-09-18 DIAGNOSIS — F0283 Dementia in other diseases classified elsewhere, unspecified severity, with mood disturbance: Secondary | ICD-10-CM | POA: Diagnosis not present

## 2022-09-18 DIAGNOSIS — I358 Other nonrheumatic aortic valve disorders: Secondary | ICD-10-CM | POA: Diagnosis not present

## 2022-09-18 DIAGNOSIS — I442 Atrioventricular block, complete: Secondary | ICD-10-CM | POA: Diagnosis not present

## 2022-09-18 DIAGNOSIS — Z8616 Personal history of COVID-19: Secondary | ICD-10-CM | POA: Diagnosis not present

## 2022-09-18 DIAGNOSIS — I4892 Unspecified atrial flutter: Secondary | ICD-10-CM | POA: Diagnosis not present

## 2022-09-18 DIAGNOSIS — Z7901 Long term (current) use of anticoagulants: Secondary | ICD-10-CM | POA: Diagnosis not present

## 2022-09-18 DIAGNOSIS — K219 Gastro-esophageal reflux disease without esophagitis: Secondary | ICD-10-CM | POA: Diagnosis not present

## 2022-09-18 DIAGNOSIS — I5033 Acute on chronic diastolic (congestive) heart failure: Secondary | ICD-10-CM | POA: Diagnosis not present

## 2022-09-18 DIAGNOSIS — E1140 Type 2 diabetes mellitus with diabetic neuropathy, unspecified: Secondary | ICD-10-CM | POA: Diagnosis not present

## 2022-09-18 DIAGNOSIS — I480 Paroxysmal atrial fibrillation: Secondary | ICD-10-CM | POA: Diagnosis not present

## 2022-09-18 DIAGNOSIS — Z950 Presence of cardiac pacemaker: Secondary | ICD-10-CM | POA: Diagnosis not present

## 2022-09-18 DIAGNOSIS — E039 Hypothyroidism, unspecified: Secondary | ICD-10-CM | POA: Diagnosis not present

## 2022-09-18 DIAGNOSIS — Z7982 Long term (current) use of aspirin: Secondary | ICD-10-CM | POA: Diagnosis not present

## 2022-09-18 DIAGNOSIS — Z792 Long term (current) use of antibiotics: Secondary | ICD-10-CM | POA: Diagnosis not present

## 2022-09-18 DIAGNOSIS — J449 Chronic obstructive pulmonary disease, unspecified: Secondary | ICD-10-CM | POA: Diagnosis not present

## 2022-09-18 DIAGNOSIS — F0284 Dementia in other diseases classified elsewhere, unspecified severity, with anxiety: Secondary | ICD-10-CM | POA: Diagnosis not present

## 2022-09-18 DIAGNOSIS — Z86711 Personal history of pulmonary embolism: Secondary | ICD-10-CM | POA: Diagnosis not present

## 2022-09-19 ENCOUNTER — Ambulatory Visit (INDEPENDENT_AMBULATORY_CARE_PROVIDER_SITE_OTHER): Payer: Medicare Other | Admitting: Internal Medicine

## 2022-09-19 ENCOUNTER — Encounter: Payer: Self-pay | Admitting: Internal Medicine

## 2022-09-19 VITALS — BP 110/70 | HR 60 | Temp 98.6°F | Ht 62.0 in | Wt 222.0 lb

## 2022-09-19 DIAGNOSIS — Z792 Long term (current) use of antibiotics: Secondary | ICD-10-CM | POA: Diagnosis not present

## 2022-09-19 DIAGNOSIS — I480 Paroxysmal atrial fibrillation: Secondary | ICD-10-CM | POA: Diagnosis not present

## 2022-09-19 DIAGNOSIS — F03918 Unspecified dementia, unspecified severity, with other behavioral disturbance: Secondary | ICD-10-CM | POA: Diagnosis not present

## 2022-09-19 DIAGNOSIS — R0609 Other forms of dyspnea: Secondary | ICD-10-CM | POA: Diagnosis not present

## 2022-09-19 DIAGNOSIS — J449 Chronic obstructive pulmonary disease, unspecified: Secondary | ICD-10-CM | POA: Diagnosis not present

## 2022-09-19 DIAGNOSIS — E039 Hypothyroidism, unspecified: Secondary | ICD-10-CM | POA: Diagnosis not present

## 2022-09-19 DIAGNOSIS — I5033 Acute on chronic diastolic (congestive) heart failure: Secondary | ICD-10-CM

## 2022-09-19 DIAGNOSIS — Z8616 Personal history of COVID-19: Secondary | ICD-10-CM | POA: Diagnosis not present

## 2022-09-19 DIAGNOSIS — I358 Other nonrheumatic aortic valve disorders: Secondary | ICD-10-CM | POA: Diagnosis not present

## 2022-09-19 DIAGNOSIS — Z86711 Personal history of pulmonary embolism: Secondary | ICD-10-CM | POA: Diagnosis not present

## 2022-09-19 DIAGNOSIS — I110 Hypertensive heart disease with heart failure: Secondary | ICD-10-CM | POA: Diagnosis not present

## 2022-09-19 DIAGNOSIS — M1612 Unilateral primary osteoarthritis, left hip: Secondary | ICD-10-CM | POA: Diagnosis not present

## 2022-09-19 DIAGNOSIS — Z7984 Long term (current) use of oral hypoglycemic drugs: Secondary | ICD-10-CM | POA: Diagnosis not present

## 2022-09-19 DIAGNOSIS — K219 Gastro-esophageal reflux disease without esophagitis: Secondary | ICD-10-CM | POA: Diagnosis not present

## 2022-09-19 DIAGNOSIS — E1140 Type 2 diabetes mellitus with diabetic neuropathy, unspecified: Secondary | ICD-10-CM | POA: Diagnosis not present

## 2022-09-19 DIAGNOSIS — I4892 Unspecified atrial flutter: Secondary | ICD-10-CM | POA: Diagnosis not present

## 2022-09-19 DIAGNOSIS — F0284 Dementia in other diseases classified elsewhere, unspecified severity, with anxiety: Secondary | ICD-10-CM | POA: Diagnosis not present

## 2022-09-19 DIAGNOSIS — I4891 Unspecified atrial fibrillation: Secondary | ICD-10-CM

## 2022-09-19 DIAGNOSIS — I5022 Chronic systolic (congestive) heart failure: Secondary | ICD-10-CM | POA: Diagnosis not present

## 2022-09-19 DIAGNOSIS — I442 Atrioventricular block, complete: Secondary | ICD-10-CM | POA: Diagnosis not present

## 2022-09-19 DIAGNOSIS — F32A Depression, unspecified: Secondary | ICD-10-CM | POA: Diagnosis not present

## 2022-09-19 DIAGNOSIS — Z7901 Long term (current) use of anticoagulants: Secondary | ICD-10-CM | POA: Diagnosis not present

## 2022-09-19 DIAGNOSIS — F0283 Dementia in other diseases classified elsewhere, unspecified severity, with mood disturbance: Secondary | ICD-10-CM | POA: Diagnosis not present

## 2022-09-19 DIAGNOSIS — Z950 Presence of cardiac pacemaker: Secondary | ICD-10-CM | POA: Diagnosis not present

## 2022-09-19 DIAGNOSIS — F02818 Dementia in other diseases classified elsewhere, unspecified severity, with other behavioral disturbance: Secondary | ICD-10-CM | POA: Diagnosis not present

## 2022-09-19 DIAGNOSIS — Z7982 Long term (current) use of aspirin: Secondary | ICD-10-CM | POA: Diagnosis not present

## 2022-09-19 NOTE — Progress Notes (Signed)
Subjective:  Patient ID: Jessica [name], female    DOB: 08-18-1933  Age: 89 y.o. MRN: [id number]  CC: Follow-up   HPI Jessica [name] presents for CHF, diabetes, back pain She is here with her niece [name] and her husband.  Insurance form for home care   Outpatient Medications Prior to Visit  Medication Sig Dispense Refill   albuterol (VENTOLIN HFA) 108 (90 Base) MCG/ACT inhaler Inhale 2 puffs into the lungs every 6 (six) hours as needed for wheezing or shortness of breath. 8 g 2   allopurinol (ZYLOPRIM) 100 MG tablet TAKE 1 TABLET BY MOUTH DAILY (Patient taking differently: Take 100 mg by mouth daily.) 100 tablet 2   b complex vitamins tablet Take 1 tablet by mouth daily.     Cholecalciferol 1000 UNITS tablet Take 1,000 Units by mouth daily.     ezetimibe (ZETIA) 10 MG tablet TAKE 1 TABLET BY MOUTH DAILY 90 tablet 2   furosemide (LASIX) 40 MG tablet Take 40mg  tablet daily with an extra tablet in the afternoon for swelling, weight gain of 2 pounds overnight or 5 pounds in one week! 100 tablet 3   levothyroxine (SYNTHROID) 75 MCG tablet TAKE 1 TABLET BY MOUTH DAILY (Patient taking differently: Take by mouth daily before breakfast.) 100 tablet 2   metFORMIN (GLUCOPHAGE) 500 MG tablet TAKE 1 TABLET BY MOUTH TWICE  DAILY WITH MEALS 200 tablet 2   metoprolol tartrate (LOPRESSOR) 25 MG tablet Take 1 tablet (25 mg total) by mouth 2 (two) times daily. 180 tablet 3   Multiple Vitamin (MULTIVITAMIN) tablet Take 1 tablet by mouth daily.     nystatin powder Apply 1 Application topically daily. Apply to under breast topically every day shift for redness/moisture.     repaglinide (PRANDIN) 2 MG tablet TAKE 2 TABLETS BY MOUTH 3  TIMES DAILY BEFORE MEALS (Patient taking differently: Take 2 mg by mouth 3 (three) times daily before meals.) 540 tablet 3   rosuvastatin (CRESTOR) 10 MG tablet TAKE 1 TABLET BY MOUTH UP TO 3  TIMES WEEKLY AS TOLERATED (Patient taking differently: Take 10 mg by mouth every  Monday, Wednesday, and Friday.) 43 tablet 2   saccharomyces boulardii (FLORASTOR) 250 MG capsule Take 250 mg by mouth 2 (two) times daily.     sacubitril-valsartan (ENTRESTO) 97-103 MG Take 1 tablet by mouth 2 (two) times daily. 180 tablet 3   spironolactone (ALDACTONE) 25 MG tablet Take 0.5 tablets (12.5 mg total) by mouth daily. 100 tablet 3   temazepam (RESTORIL) 15 MG capsule TAKE 1 CAPSULE BY MOUTH EVERY NIGHT AT BEDTIME AS NEEDED 90 capsule 1   venlafaxine XR (EFFEXOR-XR) 75 MG 24 hr capsule TAKE 1 CAPSULE BY MOUTH DAILY  WITH BREAKFAST 100 capsule 1   apixaban (ELIQUIS) 5 MG TABS tablet Take 1 tablet (5 mg total) by mouth 2 (two) times daily. (Patient not taking: Reported on 08/10/2022) 180 tablet 3   apixaban (ELIQUIS) 5 MG TABS tablet Take 1 tablet (5 mg total) by mouth 2 (two) times daily for 21 days. (Patient not taking: Reported on 08/14/2022) 42 tablet 0   empagliflozin (JARDIANCE) 10 MG TABS tablet Take 1 tablet (10 mg total) by mouth daily. (Patient not taking: Reported on 08/10/2022) 90 tablet 3   empagliflozin (JARDIANCE) 10 MG TABS tablet Take 1 tablet (10 mg total) by mouth daily before breakfast. (Patient not taking: Reported on 08/10/2022) 21 tablet 0   famotidine (PEPCID) 20 MG tablet Take 1 tablet (20 mg total)  by mouth daily. 30 tablet 0   temazepam (RESTORIL) 7.5 MG capsule Take 7.5 mg by mouth at bedtime. (Patient not taking: Reported on 08/24/2022)     No facility-administered medications prior to visit.    ROS: Review of Systems  Constitutional:  Positive for fatigue. Negative for activity change, appetite change, chills and unexpected weight change.  HENT:  Negative for congestion, mouth sores and sinus pressure.   Eyes:  Negative for visual disturbance.  Respiratory:  Negative for cough and chest tightness.   Gastrointestinal:  Negative for abdominal pain and nausea.  Genitourinary:  Negative for difficulty urinating, frequency and vaginal pain.  Musculoskeletal:   Negative for back pain and gait problem.  Skin:  Negative for pallor and rash.  Neurological:  Positive for weakness. Negative for dizziness, tremors, numbness and headaches.  Hematological:  Bruises/bleeds easily.  Psychiatric/Behavioral:  Positive for self-injury. Negative for confusion, sleep disturbance and suicidal ideas. The patient is nervous/anxious.     Objective:  BP 110/70 (BP Location: Left Arm, Patient Position: Sitting, Cuff Size: Large)   Pulse 60   Temp 98.6 F (37 C) (Oral)   Ht 5\' 2"  (1.575 m)   Wt 222 lb (100.7 kg)   SpO2 97%   BMI 40.60 kg/m   BP Readings from Last 3 Encounters:  09/19/22 110/70  08/14/22 128/78  07/14/22 134/60    Wt Readings from Last 3 Encounters:  09/19/22 222 lb (100.7 kg)  08/14/22 219 lb (99.3 kg)  07/14/22 221 lb (100.2 kg)    Physical Exam Constitutional:      General: She is not in acute distress.    Appearance: She is well-developed. She is obese. She is not toxic-appearing.  HENT:     Head: Normocephalic.     Right Ear: External ear normal.     Left Ear: External ear normal.     Nose: Nose normal.  Eyes:     General:        Right eye: No discharge.        Left eye: No discharge.     Conjunctiva/sclera: Conjunctivae normal.     Pupils: Pupils are equal, round, and reactive to light.  Neck:     Thyroid: No thyromegaly.     Vascular: No JVD.     Trachea: No tracheal deviation.  Cardiovascular:     Rate and Rhythm: Normal rate and regular rhythm.     Heart sounds: Normal heart sounds.     No gallop.  Pulmonary:     Effort: No respiratory distress.     Breath sounds: No stridor. No wheezing.  Abdominal:     General: Bowel sounds are normal. There is no distension.     Palpations: Abdomen is soft. There is no mass.     Tenderness: There is abdominal tenderness. There is no guarding or rebound.  Musculoskeletal:     Cervical back: Normal range of motion and neck supple. Tenderness present. No rigidity.   Lymphadenopathy:     Cervical: No cervical adenopathy.  Skin:    Findings: No erythema or rash.  Neurological:     Mental Status: She is oriented to person, place, and time. Mental status is at baseline.     Cranial Nerves: No cranial nerve deficit.     Motor: Weakness present. No abnormal muscle tone.     Coordination: Coordination abnormal.     Gait: Gait abnormal.     Deep Tendon Reflexes: Reflexes normal.  Psychiatric:  Behavior: Behavior normal.        Thought Content: Thought content normal.   In a wheelchair    insurance form was filled out and signed    A total time of 45 minutes was spent preparing to see the patient, reviewing tests, x-rays, operative reports and other medical records.  Also, obtaining history and performing comprehensive physical exam.  Additionally, counseling the patient regarding the above listed issues: CHF, fall prevention, obesity.   Finally, documenting clinical information in the health records, coordination of care, educating the patient, filling out and faxing the insurance form.   Lab Results  Component Value Date   WBC 6.7 07/14/2022   HGB 11.7 07/14/2022   HCT 35.4 07/14/2022   PLT 244 07/14/2022   GLUCOSE 165 (H) 08/14/2022   CHOL 207 (H) 07/12/2020   TRIG 277 (H) 07/12/2020   HDL 39 (L) 07/12/2020   LDLDIRECT 155.9 09/30/2012   LDLCALC 119 (H) 07/12/2020   ALT 20 08/14/2022   AST 29 08/14/2022   NA 136 08/14/2022   K 4.7 08/14/2022   CL 100 08/14/2022   CREATININE 0.96 08/14/2022   BUN 25 (H) 08/14/2022   CO2 28 08/14/2022   TSH 3.43 08/14/2022   INR 3.9 (H) 04/25/2022   HGBA1C 8.1 (H) 08/14/2022   MICROALBUR 4.5 (H) 12/02/2009    DG Hip Unilat With Pelvis 2-3 Views Left  Result Date: 05/14/2022 CLINICAL DATA:  Cough, weakness, hip pain EXAM: DG HIP (WITH OR WITHOUT PELVIS) 2-3V LEFT COMPARISON:  None Available. FINDINGS: No acute fracture or dislocation. No aggressive osseous lesion. Normal alignment. Mild  osteoarthritis of the left hip. Generalized osteopenia. Soft tissue are unremarkable. No radiopaque foreign body or soft tissue emphysema. IMPRESSION: 1. Mild osteoarthritis of the left hip. Given the patient's age and osteopenia, if there is persistent clinical concern for an occult hip fracture, a MRI of the hip is recommended for increased sensitivity. Electronically Signed   By: Elige Ko M.D.   On: 05/14/2022 09:35   DG Chest Port 1 View  Result Date: 05/14/2022 CLINICAL DATA:  Cough, pain EXAM: PORTABLE CHEST 1 VIEW COMPARISON:  04/25/2022 FINDINGS: Bilateral interstitial thickening. Small bilateral pleural effusions. Bibasilar atelectasis. No pneumothorax. Stable cardiomegaly. Prior TAVR. Dual lead cardiac pacemaker. No acute osseous abnormality. IMPRESSION: 1. Findings concerning for CHF. Electronically Signed   By: Elige Ko M.D.   On: 05/14/2022 09:34    Assessment & Plan:   Problem List Items Addressed This Visit     DOE (dyspnea on exertion)    Multifactorial  Better on Entresto Wt loss should help      Acute on chronic diastolic CHF (congestive heart failure) (HCC) - Primary    Continue on Entresto, metoprolol Take Eliquis or 162 mg ASA a day      Atrial fibrillation and flutter (HCC)    Cont on Toprol, Eliquis      Dementia with behavioral disturbance (HCC)    Stable         No orders of the defined types were placed in this encounter.     Follow-up: No follow-ups on file.  Sonda Primes, MD

## 2022-09-19 NOTE — Assessment & Plan Note (Signed)
Continue on Entresto, metoprolol Take Eliquis or 162 mg ASA a day 

## 2022-09-19 NOTE — Assessment & Plan Note (Signed)
Stable

## 2022-09-19 NOTE — Assessment & Plan Note (Signed)
Cont on Toprol, Eliquis 

## 2022-09-19 NOTE — Telephone Encounter (Signed)
Miss Reynold's niece dropped off LIS denial letter. Scanned into chart under financial documents.   Jardiance PAP application including LIS denial letter and med/allergy list faxed.   Eliquis PAP previously faxed. LIS denial letter faxed for their review.   Alver Sorrow, NP

## 2022-09-19 NOTE — Assessment & Plan Note (Signed)
Multifactorial  Better on Entresto Wt loss should help

## 2022-09-20 DIAGNOSIS — J449 Chronic obstructive pulmonary disease, unspecified: Secondary | ICD-10-CM | POA: Diagnosis not present

## 2022-09-20 DIAGNOSIS — Z950 Presence of cardiac pacemaker: Secondary | ICD-10-CM | POA: Diagnosis not present

## 2022-09-20 DIAGNOSIS — Z86711 Personal history of pulmonary embolism: Secondary | ICD-10-CM | POA: Diagnosis not present

## 2022-09-20 DIAGNOSIS — M1612 Unilateral primary osteoarthritis, left hip: Secondary | ICD-10-CM | POA: Diagnosis not present

## 2022-09-20 DIAGNOSIS — I110 Hypertensive heart disease with heart failure: Secondary | ICD-10-CM | POA: Diagnosis not present

## 2022-09-20 DIAGNOSIS — I5022 Chronic systolic (congestive) heart failure: Secondary | ICD-10-CM | POA: Diagnosis not present

## 2022-09-20 DIAGNOSIS — Z7984 Long term (current) use of oral hypoglycemic drugs: Secondary | ICD-10-CM | POA: Diagnosis not present

## 2022-09-20 DIAGNOSIS — I358 Other nonrheumatic aortic valve disorders: Secondary | ICD-10-CM | POA: Diagnosis not present

## 2022-09-20 DIAGNOSIS — Z7901 Long term (current) use of anticoagulants: Secondary | ICD-10-CM | POA: Diagnosis not present

## 2022-09-20 DIAGNOSIS — I480 Paroxysmal atrial fibrillation: Secondary | ICD-10-CM | POA: Diagnosis not present

## 2022-09-20 DIAGNOSIS — E039 Hypothyroidism, unspecified: Secondary | ICD-10-CM | POA: Diagnosis not present

## 2022-09-20 DIAGNOSIS — Z8616 Personal history of COVID-19: Secondary | ICD-10-CM | POA: Diagnosis not present

## 2022-09-20 DIAGNOSIS — E1140 Type 2 diabetes mellitus with diabetic neuropathy, unspecified: Secondary | ICD-10-CM | POA: Diagnosis not present

## 2022-09-20 DIAGNOSIS — F02818 Dementia in other diseases classified elsewhere, unspecified severity, with other behavioral disturbance: Secondary | ICD-10-CM | POA: Diagnosis not present

## 2022-09-20 DIAGNOSIS — I5033 Acute on chronic diastolic (congestive) heart failure: Secondary | ICD-10-CM | POA: Diagnosis not present

## 2022-09-20 DIAGNOSIS — F0284 Dementia in other diseases classified elsewhere, unspecified severity, with anxiety: Secondary | ICD-10-CM | POA: Diagnosis not present

## 2022-09-20 DIAGNOSIS — I442 Atrioventricular block, complete: Secondary | ICD-10-CM | POA: Diagnosis not present

## 2022-09-20 DIAGNOSIS — I4892 Unspecified atrial flutter: Secondary | ICD-10-CM | POA: Diagnosis not present

## 2022-09-20 DIAGNOSIS — F0283 Dementia in other diseases classified elsewhere, unspecified severity, with mood disturbance: Secondary | ICD-10-CM | POA: Diagnosis not present

## 2022-09-20 DIAGNOSIS — K219 Gastro-esophageal reflux disease without esophagitis: Secondary | ICD-10-CM | POA: Diagnosis not present

## 2022-09-20 DIAGNOSIS — Z792 Long term (current) use of antibiotics: Secondary | ICD-10-CM | POA: Diagnosis not present

## 2022-09-20 DIAGNOSIS — F32A Depression, unspecified: Secondary | ICD-10-CM | POA: Diagnosis not present

## 2022-09-20 DIAGNOSIS — Z7982 Long term (current) use of aspirin: Secondary | ICD-10-CM | POA: Diagnosis not present

## 2022-09-21 DIAGNOSIS — F02818 Dementia in other diseases classified elsewhere, unspecified severity, with other behavioral disturbance: Secondary | ICD-10-CM | POA: Diagnosis not present

## 2022-09-21 DIAGNOSIS — F0283 Dementia in other diseases classified elsewhere, unspecified severity, with mood disturbance: Secondary | ICD-10-CM | POA: Diagnosis not present

## 2022-09-21 DIAGNOSIS — Z950 Presence of cardiac pacemaker: Secondary | ICD-10-CM | POA: Diagnosis not present

## 2022-09-21 DIAGNOSIS — Z7901 Long term (current) use of anticoagulants: Secondary | ICD-10-CM | POA: Diagnosis not present

## 2022-09-21 DIAGNOSIS — F32A Depression, unspecified: Secondary | ICD-10-CM | POA: Diagnosis not present

## 2022-09-21 DIAGNOSIS — I480 Paroxysmal atrial fibrillation: Secondary | ICD-10-CM | POA: Diagnosis not present

## 2022-09-21 DIAGNOSIS — Z86711 Personal history of pulmonary embolism: Secondary | ICD-10-CM | POA: Diagnosis not present

## 2022-09-21 DIAGNOSIS — Z7982 Long term (current) use of aspirin: Secondary | ICD-10-CM | POA: Diagnosis not present

## 2022-09-21 DIAGNOSIS — I358 Other nonrheumatic aortic valve disorders: Secondary | ICD-10-CM | POA: Diagnosis not present

## 2022-09-21 DIAGNOSIS — Z8616 Personal history of COVID-19: Secondary | ICD-10-CM | POA: Diagnosis not present

## 2022-09-21 DIAGNOSIS — I5022 Chronic systolic (congestive) heart failure: Secondary | ICD-10-CM | POA: Diagnosis not present

## 2022-09-21 DIAGNOSIS — Z7984 Long term (current) use of oral hypoglycemic drugs: Secondary | ICD-10-CM | POA: Diagnosis not present

## 2022-09-21 DIAGNOSIS — E1140 Type 2 diabetes mellitus with diabetic neuropathy, unspecified: Secondary | ICD-10-CM | POA: Diagnosis not present

## 2022-09-21 DIAGNOSIS — E039 Hypothyroidism, unspecified: Secondary | ICD-10-CM | POA: Diagnosis not present

## 2022-09-21 DIAGNOSIS — I110 Hypertensive heart disease with heart failure: Secondary | ICD-10-CM | POA: Diagnosis not present

## 2022-09-21 DIAGNOSIS — J449 Chronic obstructive pulmonary disease, unspecified: Secondary | ICD-10-CM | POA: Diagnosis not present

## 2022-09-21 DIAGNOSIS — I5033 Acute on chronic diastolic (congestive) heart failure: Secondary | ICD-10-CM | POA: Diagnosis not present

## 2022-09-21 DIAGNOSIS — Z792 Long term (current) use of antibiotics: Secondary | ICD-10-CM | POA: Diagnosis not present

## 2022-09-21 DIAGNOSIS — F0284 Dementia in other diseases classified elsewhere, unspecified severity, with anxiety: Secondary | ICD-10-CM | POA: Diagnosis not present

## 2022-09-21 DIAGNOSIS — K219 Gastro-esophageal reflux disease without esophagitis: Secondary | ICD-10-CM | POA: Diagnosis not present

## 2022-09-21 DIAGNOSIS — I4892 Unspecified atrial flutter: Secondary | ICD-10-CM | POA: Diagnosis not present

## 2022-09-21 DIAGNOSIS — I442 Atrioventricular block, complete: Secondary | ICD-10-CM | POA: Diagnosis not present

## 2022-09-21 DIAGNOSIS — M1612 Unilateral primary osteoarthritis, left hip: Secondary | ICD-10-CM | POA: Diagnosis not present

## 2022-09-24 DIAGNOSIS — F0284 Dementia in other diseases classified elsewhere, unspecified severity, with anxiety: Secondary | ICD-10-CM | POA: Diagnosis not present

## 2022-09-24 DIAGNOSIS — F32A Depression, unspecified: Secondary | ICD-10-CM | POA: Diagnosis not present

## 2022-09-24 DIAGNOSIS — Z8616 Personal history of COVID-19: Secondary | ICD-10-CM | POA: Diagnosis not present

## 2022-09-24 DIAGNOSIS — I442 Atrioventricular block, complete: Secondary | ICD-10-CM | POA: Diagnosis not present

## 2022-09-24 DIAGNOSIS — F0283 Dementia in other diseases classified elsewhere, unspecified severity, with mood disturbance: Secondary | ICD-10-CM | POA: Diagnosis not present

## 2022-09-24 DIAGNOSIS — Z7984 Long term (current) use of oral hypoglycemic drugs: Secondary | ICD-10-CM | POA: Diagnosis not present

## 2022-09-24 DIAGNOSIS — I5022 Chronic systolic (congestive) heart failure: Secondary | ICD-10-CM | POA: Diagnosis not present

## 2022-09-24 DIAGNOSIS — M1612 Unilateral primary osteoarthritis, left hip: Secondary | ICD-10-CM | POA: Diagnosis not present

## 2022-09-24 DIAGNOSIS — I5033 Acute on chronic diastolic (congestive) heart failure: Secondary | ICD-10-CM | POA: Diagnosis not present

## 2022-09-24 DIAGNOSIS — I4892 Unspecified atrial flutter: Secondary | ICD-10-CM | POA: Diagnosis not present

## 2022-09-24 DIAGNOSIS — Z86711 Personal history of pulmonary embolism: Secondary | ICD-10-CM | POA: Diagnosis not present

## 2022-09-24 DIAGNOSIS — Z950 Presence of cardiac pacemaker: Secondary | ICD-10-CM | POA: Diagnosis not present

## 2022-09-24 DIAGNOSIS — E1140 Type 2 diabetes mellitus with diabetic neuropathy, unspecified: Secondary | ICD-10-CM | POA: Diagnosis not present

## 2022-09-24 DIAGNOSIS — F02818 Dementia in other diseases classified elsewhere, unspecified severity, with other behavioral disturbance: Secondary | ICD-10-CM | POA: Diagnosis not present

## 2022-09-24 DIAGNOSIS — Z7982 Long term (current) use of aspirin: Secondary | ICD-10-CM | POA: Diagnosis not present

## 2022-09-24 DIAGNOSIS — E039 Hypothyroidism, unspecified: Secondary | ICD-10-CM | POA: Diagnosis not present

## 2022-09-24 DIAGNOSIS — Z7901 Long term (current) use of anticoagulants: Secondary | ICD-10-CM | POA: Diagnosis not present

## 2022-09-24 DIAGNOSIS — K219 Gastro-esophageal reflux disease without esophagitis: Secondary | ICD-10-CM | POA: Diagnosis not present

## 2022-09-24 DIAGNOSIS — I110 Hypertensive heart disease with heart failure: Secondary | ICD-10-CM | POA: Diagnosis not present

## 2022-09-24 DIAGNOSIS — I480 Paroxysmal atrial fibrillation: Secondary | ICD-10-CM | POA: Diagnosis not present

## 2022-09-24 DIAGNOSIS — Z792 Long term (current) use of antibiotics: Secondary | ICD-10-CM | POA: Diagnosis not present

## 2022-09-24 DIAGNOSIS — I358 Other nonrheumatic aortic valve disorders: Secondary | ICD-10-CM | POA: Diagnosis not present

## 2022-09-24 DIAGNOSIS — J449 Chronic obstructive pulmonary disease, unspecified: Secondary | ICD-10-CM | POA: Diagnosis not present

## 2022-09-25 ENCOUNTER — Telehealth: Payer: Self-pay

## 2022-09-25 ENCOUNTER — Ambulatory Visit: Payer: Self-pay

## 2022-09-25 DIAGNOSIS — Z86711 Personal history of pulmonary embolism: Secondary | ICD-10-CM | POA: Diagnosis not present

## 2022-09-25 DIAGNOSIS — Z7982 Long term (current) use of aspirin: Secondary | ICD-10-CM | POA: Diagnosis not present

## 2022-09-25 DIAGNOSIS — I5022 Chronic systolic (congestive) heart failure: Secondary | ICD-10-CM | POA: Diagnosis not present

## 2022-09-25 DIAGNOSIS — E039 Hypothyroidism, unspecified: Secondary | ICD-10-CM | POA: Diagnosis not present

## 2022-09-25 DIAGNOSIS — F0284 Dementia in other diseases classified elsewhere, unspecified severity, with anxiety: Secondary | ICD-10-CM | POA: Diagnosis not present

## 2022-09-25 DIAGNOSIS — Z950 Presence of cardiac pacemaker: Secondary | ICD-10-CM | POA: Diagnosis not present

## 2022-09-25 DIAGNOSIS — I358 Other nonrheumatic aortic valve disorders: Secondary | ICD-10-CM | POA: Diagnosis not present

## 2022-09-25 DIAGNOSIS — I442 Atrioventricular block, complete: Secondary | ICD-10-CM | POA: Diagnosis not present

## 2022-09-25 DIAGNOSIS — Z8616 Personal history of COVID-19: Secondary | ICD-10-CM | POA: Diagnosis not present

## 2022-09-25 DIAGNOSIS — I480 Paroxysmal atrial fibrillation: Secondary | ICD-10-CM | POA: Diagnosis not present

## 2022-09-25 DIAGNOSIS — K219 Gastro-esophageal reflux disease without esophagitis: Secondary | ICD-10-CM | POA: Diagnosis not present

## 2022-09-25 DIAGNOSIS — M1612 Unilateral primary osteoarthritis, left hip: Secondary | ICD-10-CM | POA: Diagnosis not present

## 2022-09-25 DIAGNOSIS — I4892 Unspecified atrial flutter: Secondary | ICD-10-CM | POA: Diagnosis not present

## 2022-09-25 DIAGNOSIS — F32A Depression, unspecified: Secondary | ICD-10-CM | POA: Diagnosis not present

## 2022-09-25 DIAGNOSIS — I110 Hypertensive heart disease with heart failure: Secondary | ICD-10-CM | POA: Diagnosis not present

## 2022-09-25 DIAGNOSIS — F0283 Dementia in other diseases classified elsewhere, unspecified severity, with mood disturbance: Secondary | ICD-10-CM | POA: Diagnosis not present

## 2022-09-25 DIAGNOSIS — Z7901 Long term (current) use of anticoagulants: Secondary | ICD-10-CM | POA: Diagnosis not present

## 2022-09-25 DIAGNOSIS — Z7984 Long term (current) use of oral hypoglycemic drugs: Secondary | ICD-10-CM | POA: Diagnosis not present

## 2022-09-25 DIAGNOSIS — F02818 Dementia in other diseases classified elsewhere, unspecified severity, with other behavioral disturbance: Secondary | ICD-10-CM | POA: Diagnosis not present

## 2022-09-25 DIAGNOSIS — Z792 Long term (current) use of antibiotics: Secondary | ICD-10-CM | POA: Diagnosis not present

## 2022-09-25 DIAGNOSIS — J449 Chronic obstructive pulmonary disease, unspecified: Secondary | ICD-10-CM | POA: Diagnosis not present

## 2022-09-25 DIAGNOSIS — E1140 Type 2 diabetes mellitus with diabetic neuropathy, unspecified: Secondary | ICD-10-CM | POA: Diagnosis not present

## 2022-09-25 DIAGNOSIS — I5033 Acute on chronic diastolic (congestive) heart failure: Secondary | ICD-10-CM | POA: Diagnosis not present

## 2022-09-25 NOTE — Patient Outreach (Signed)
  Care Coordination   Follow Up Visit Note   09/25/2022 Name: Jessica [name] MRN: [id number] DOB: 1933-08-16  Jessica [name] is a 89 y.o. year old female who sees Plotnikov, Georgina Quint, MD for primary care. I spoke with  Jessica [name] by phone today.  What matters to the patients health and wellness today?  Jessica [name] reports  office visit with PCP on 09/19/22. She reports she denies any questions at this time. She reports she is not weighing self because she has a new scale and cannot get it to working adding it is a Proofreader. She states her niece is coming this week and she will ask her niece to assist with getting scale to work. Jessica [name] states she thinks the application for Eliquis assistance has been denied. Jessica [name] reports she continues to have home health PT, but is not following through with exercised recommended by therapist due to concern that she will fall.  Goals Addressed             This Visit's Progress    Care Coordination-assist with health management       Interventions Today    Flowsheet Row Most Recent Value  Chronic Disease   Chronic disease during today's visit Congestive Heart Failure (CHF)  General Interventions   General Interventions Discussed/Reviewed General Interventions Reviewed, Doctor Visits, Communication with  Doctor Visits Discussed/Reviewed Doctor Visits Reviewed  Communication with Pharmacists  [follow up medication assistance: Eliquis]  Exercise Interventions   Exercise Discussed/Reviewed Exercise Reviewed  [confirmed remains active with home health. encouraged to complete exercises recommended between home visit sessions.]  Education Interventions   Education Provided Provided Education  Provided Verbal Education On Exercise, When to see the doctor, Medication  [advised to continue to take medications as rpescribed. advised to perform the exercises that physical therapist has recommended in between session. reinforced the  importance in order to maintain muscle strength/tone.advised to eat health.]  Nutrition Interventions   Nutrition Discussed/Reviewed Nutrition Reviewed  Pharmacy Interventions   Pharmacy Dicussed/Reviewed Pharmacy Topics Reviewed  Safety Interventions   Safety Discussed/Reviewed Safety Reviewed, Fall Risk, Home Safety  Home Safety Assistive Devices  [confirmed has life alert necklace]            SDOH assessments and interventions completed:  No  Care Coordination Interventions:  Yes, provided   Follow up plan: Follow up call scheduled for 10/25/22    Encounter Outcome:  Pt. Visit Completed   Jessica Sheriff, RN, MSN, BSN, CCM [facility] Care Coordinator [phone]

## 2022-09-25 NOTE — Patient Instructions (Addendum)
Visit Information  Thank you for taking time to visit with me today. Please don't hesitate to contact me if I can be of assistance to you.   Following are the goals we discussed today:  Take medications as prescribed Continue to attend provider visits as scheduled Contact your provider with health questions or concerns as needed Ask your niece to help you with how to work your scale and begin daily weights. Notify cardiologist if any signs/symptoms of HF exacerbation: weight gain 3 pounds in 24 hour period or 5 pounds in a week. Increased swelling of feet/ankles/stomach/hands; increased Shortness of breath, hard to breath laying down and you have to sit up to breath or increase in number of pillows needed, increased fatigue; you feel something is not right or any questions or concerns. Continue to check blood sugars as prescribed and notify provider if outside recommended range  Our next appointment is by telephone on 10/25/22 at 11:00 am  Please call the care guide team at [phone] if you need to cancel or reschedule your appointment.   If you are experiencing a Mental Health or Behavioral Health Crisis or need someone to talk to, please call the Suicide and Crisis Lifeline: 49  [name], RN, MSN, BSN, CCM [facility] Care  Coordinator [phone]

## 2022-09-25 NOTE — Progress Notes (Signed)
Error

## 2022-09-26 ENCOUNTER — Telehealth: Payer: Self-pay

## 2022-09-26 DIAGNOSIS — I4892 Unspecified atrial flutter: Secondary | ICD-10-CM | POA: Diagnosis not present

## 2022-09-26 DIAGNOSIS — M1612 Unilateral primary osteoarthritis, left hip: Secondary | ICD-10-CM | POA: Diagnosis not present

## 2022-09-26 DIAGNOSIS — I5033 Acute on chronic diastolic (congestive) heart failure: Secondary | ICD-10-CM | POA: Diagnosis not present

## 2022-09-26 DIAGNOSIS — I442 Atrioventricular block, complete: Secondary | ICD-10-CM | POA: Diagnosis not present

## 2022-09-26 DIAGNOSIS — I5022 Chronic systolic (congestive) heart failure: Secondary | ICD-10-CM | POA: Diagnosis not present

## 2022-09-26 DIAGNOSIS — I480 Paroxysmal atrial fibrillation: Secondary | ICD-10-CM | POA: Diagnosis not present

## 2022-09-26 DIAGNOSIS — J449 Chronic obstructive pulmonary disease, unspecified: Secondary | ICD-10-CM | POA: Diagnosis not present

## 2022-09-26 DIAGNOSIS — K219 Gastro-esophageal reflux disease without esophagitis: Secondary | ICD-10-CM | POA: Diagnosis not present

## 2022-09-26 DIAGNOSIS — E1140 Type 2 diabetes mellitus with diabetic neuropathy, unspecified: Secondary | ICD-10-CM | POA: Diagnosis not present

## 2022-09-26 DIAGNOSIS — Z86711 Personal history of pulmonary embolism: Secondary | ICD-10-CM | POA: Diagnosis not present

## 2022-09-26 DIAGNOSIS — F32A Depression, unspecified: Secondary | ICD-10-CM | POA: Diagnosis not present

## 2022-09-26 DIAGNOSIS — I358 Other nonrheumatic aortic valve disorders: Secondary | ICD-10-CM | POA: Diagnosis not present

## 2022-09-26 DIAGNOSIS — I110 Hypertensive heart disease with heart failure: Secondary | ICD-10-CM | POA: Diagnosis not present

## 2022-09-26 DIAGNOSIS — Z7982 Long term (current) use of aspirin: Secondary | ICD-10-CM | POA: Diagnosis not present

## 2022-09-26 DIAGNOSIS — F0283 Dementia in other diseases classified elsewhere, unspecified severity, with mood disturbance: Secondary | ICD-10-CM | POA: Diagnosis not present

## 2022-09-26 DIAGNOSIS — Z8616 Personal history of COVID-19: Secondary | ICD-10-CM | POA: Diagnosis not present

## 2022-09-26 DIAGNOSIS — Z950 Presence of cardiac pacemaker: Secondary | ICD-10-CM | POA: Diagnosis not present

## 2022-09-26 DIAGNOSIS — E039 Hypothyroidism, unspecified: Secondary | ICD-10-CM | POA: Diagnosis not present

## 2022-09-26 DIAGNOSIS — Z7984 Long term (current) use of oral hypoglycemic drugs: Secondary | ICD-10-CM | POA: Diagnosis not present

## 2022-09-26 DIAGNOSIS — F02818 Dementia in other diseases classified elsewhere, unspecified severity, with other behavioral disturbance: Secondary | ICD-10-CM | POA: Diagnosis not present

## 2022-09-26 DIAGNOSIS — F0284 Dementia in other diseases classified elsewhere, unspecified severity, with anxiety: Secondary | ICD-10-CM | POA: Diagnosis not present

## 2022-09-26 DIAGNOSIS — Z7901 Long term (current) use of anticoagulants: Secondary | ICD-10-CM | POA: Diagnosis not present

## 2022-09-26 DIAGNOSIS — Z792 Long term (current) use of antibiotics: Secondary | ICD-10-CM | POA: Diagnosis not present

## 2022-09-26 NOTE — Progress Notes (Signed)
   09/26/2022  Patient ID: Jessica [name], female   DOB: Sep 13, 1933, 89 y.o.   MRN: [id number]  Telephone outreach to speak with Ms. Reynold's niece, [name] [facility]), to discuss LIS denial and Eliquis/Farxiga PAPs.  -Her LIS was denied, and the denial was sent to the Eliquis program for patient assistance; because that is a requirement to get PAP through that particular manufacturer.   -Eliquis PAP also requires a 3% out-of-pocket expenditure on medications thus far for the current year.  [name] is unsure if this has been satisfied, but she plans to get information from [place] and [employer] -I contacted [employer] to check on status of [employer] PAP application, and they are stating they still have not received the LIS denial letter -Will ask if Andrey Cota can re-fax fax this to the program at [phone] -Following up with niece for out-of-pocket expenditure amount later this week  Lenna Gilford, PharmD, DPLA

## 2022-09-28 ENCOUNTER — Other Ambulatory Visit: Payer: Self-pay

## 2022-09-28 ENCOUNTER — Ambulatory Visit: Payer: Medicare Other | Admitting: Internal Medicine

## 2022-09-28 ENCOUNTER — Encounter: Payer: Self-pay | Admitting: Internal Medicine

## 2022-09-28 VITALS — BP 105/70 | HR 49 | Temp 98.0°F

## 2022-09-28 DIAGNOSIS — E039 Hypothyroidism, unspecified: Secondary | ICD-10-CM | POA: Diagnosis not present

## 2022-09-28 DIAGNOSIS — F0284 Dementia in other diseases classified elsewhere, unspecified severity, with anxiety: Secondary | ICD-10-CM | POA: Diagnosis not present

## 2022-09-28 DIAGNOSIS — I442 Atrioventricular block, complete: Secondary | ICD-10-CM | POA: Diagnosis not present

## 2022-09-28 DIAGNOSIS — I5022 Chronic systolic (congestive) heart failure: Secondary | ICD-10-CM | POA: Diagnosis not present

## 2022-09-28 DIAGNOSIS — Z7984 Long term (current) use of oral hypoglycemic drugs: Secondary | ICD-10-CM | POA: Diagnosis not present

## 2022-09-28 DIAGNOSIS — A4901 Methicillin susceptible Staphylococcus aureus infection, unspecified site: Secondary | ICD-10-CM

## 2022-09-28 DIAGNOSIS — I4892 Unspecified atrial flutter: Secondary | ICD-10-CM | POA: Diagnosis not present

## 2022-09-28 DIAGNOSIS — E1140 Type 2 diabetes mellitus with diabetic neuropathy, unspecified: Secondary | ICD-10-CM | POA: Diagnosis not present

## 2022-09-28 DIAGNOSIS — Z950 Presence of cardiac pacemaker: Secondary | ICD-10-CM | POA: Diagnosis not present

## 2022-09-28 DIAGNOSIS — J449 Chronic obstructive pulmonary disease, unspecified: Secondary | ICD-10-CM | POA: Diagnosis not present

## 2022-09-28 DIAGNOSIS — T827XXD Infection and inflammatory reaction due to other cardiac and vascular devices, implants and grafts, subsequent encounter: Secondary | ICD-10-CM

## 2022-09-28 DIAGNOSIS — I358 Other nonrheumatic aortic valve disorders: Secondary | ICD-10-CM | POA: Diagnosis not present

## 2022-09-28 DIAGNOSIS — Z7901 Long term (current) use of anticoagulants: Secondary | ICD-10-CM | POA: Diagnosis not present

## 2022-09-28 DIAGNOSIS — Z86711 Personal history of pulmonary embolism: Secondary | ICD-10-CM | POA: Diagnosis not present

## 2022-09-28 DIAGNOSIS — M1612 Unilateral primary osteoarthritis, left hip: Secondary | ICD-10-CM | POA: Diagnosis not present

## 2022-09-28 DIAGNOSIS — Z7982 Long term (current) use of aspirin: Secondary | ICD-10-CM | POA: Diagnosis not present

## 2022-09-28 DIAGNOSIS — F02818 Dementia in other diseases classified elsewhere, unspecified severity, with other behavioral disturbance: Secondary | ICD-10-CM | POA: Diagnosis not present

## 2022-09-28 DIAGNOSIS — K219 Gastro-esophageal reflux disease without esophagitis: Secondary | ICD-10-CM | POA: Diagnosis not present

## 2022-09-28 DIAGNOSIS — I5033 Acute on chronic diastolic (congestive) heart failure: Secondary | ICD-10-CM | POA: Diagnosis not present

## 2022-09-28 DIAGNOSIS — Z8619 Personal history of other infectious and parasitic diseases: Secondary | ICD-10-CM | POA: Diagnosis not present

## 2022-09-28 DIAGNOSIS — Z792 Long term (current) use of antibiotics: Secondary | ICD-10-CM | POA: Diagnosis not present

## 2022-09-28 DIAGNOSIS — I330 Acute and subacute infective endocarditis: Secondary | ICD-10-CM

## 2022-09-28 DIAGNOSIS — I480 Paroxysmal atrial fibrillation: Secondary | ICD-10-CM | POA: Diagnosis not present

## 2022-09-28 DIAGNOSIS — I110 Hypertensive heart disease with heart failure: Secondary | ICD-10-CM | POA: Diagnosis not present

## 2022-09-28 DIAGNOSIS — F32A Depression, unspecified: Secondary | ICD-10-CM | POA: Diagnosis not present

## 2022-09-28 DIAGNOSIS — F0283 Dementia in other diseases classified elsewhere, unspecified severity, with mood disturbance: Secondary | ICD-10-CM | POA: Diagnosis not present

## 2022-09-28 DIAGNOSIS — Z8616 Personal history of COVID-19: Secondary | ICD-10-CM | POA: Diagnosis not present

## 2022-09-28 MED ORDER — CEFADROXIL 500 MG PO CAPS: 500.0000 mg | ORAL_CAPSULE | Freq: Two times a day (BID) | ORAL | 3 refills | Status: DC

## 2022-09-28 NOTE — Progress Notes (Signed)
Patient ID: Jessica [name], female   DOB: [birth year], 89 y.o.   MRN: [id number]  HPI Jessica [name] is a 89 y.o. female here for hospital f/u tavr/pacer infection and mssa bacteremia  Continues to receive physical and occupational therapy (once a week) , and twice a week - to help with bathing.   Now her appetite, is improved   Lost #40 lb due to her abtx.related suppressing appetite. Now steadily improving, gaining some weight back. Now on mom's meals.   Otherwise her health is steadily improving. Outpatient Encounter Medications as of 09/28/2022  Medication Sig   albuterol (VENTOLIN HFA) 108 (90 Base) MCG/ACT inhaler Inhale 2 puffs into the lungs every 6 (six) hours as needed for wheezing or shortness of breath.   allopurinol (ZYLOPRIM) 100 MG tablet TAKE 1 TABLET BY MOUTH DAILY (Patient taking differently: Take 100 mg by mouth daily.)   aspirin EC 81 MG tablet Take 162 mg by mouth daily. Swallow whole.   b complex vitamins tablet Take 1 tablet by mouth daily.   Cholecalciferol 1000 UNITS tablet Take 1,000 Units by mouth daily.   CINNAMON PO Take 1 capsule by mouth daily.   ezetimibe (ZETIA) 10 MG tablet TAKE 1 TABLET BY MOUTH DAILY   furosemide (LASIX) 40 MG tablet Take 40mg  tablet daily with an extra tablet in the afternoon for swelling, weight gain of 2 pounds overnight or 5 pounds in one week!   levothyroxine (SYNTHROID) 75 MCG tablet TAKE 1 TABLET BY MOUTH DAILY (Patient taking differently: Take by mouth daily before breakfast.)   metFORMIN (GLUCOPHAGE) 500 MG tablet TAKE 1 TABLET BY MOUTH TWICE  DAILY WITH MEALS   metoprolol tartrate (LOPRESSOR) 25 MG tablet Take 1 tablet (25 mg total) by mouth 2 (two) times daily.   Multiple Vitamin (MULTIVITAMIN) tablet Take 1 tablet by mouth daily.   nystatin powder Apply 1 Application topically daily. Apply to under breast topically every day shift for redness/moisture.   repaglinide (PRANDIN) 2 MG tablet TAKE 2 TABLETS BY MOUTH 3  TIMES  DAILY BEFORE MEALS (Patient taking differently: Take 2 mg by mouth 3 (three) times daily before meals.)   rosuvastatin (CRESTOR) 10 MG tablet TAKE 1 TABLET BY MOUTH UP TO 3  TIMES WEEKLY AS TOLERATED (Patient taking differently: Take 10 mg by mouth every Monday, Wednesday, and Friday.)   saccharomyces boulardii (FLORASTOR) 250 MG capsule Take 250 mg by mouth 2 (two) times daily.   sacubitril-valsartan (ENTRESTO) 97-103 MG Take 1 tablet by mouth 2 (two) times daily.   spironolactone (ALDACTONE) 25 MG tablet Take 0.5 tablets (12.5 mg total) by mouth daily.   temazepam (RESTORIL) 15 MG capsule TAKE 1 CAPSULE BY MOUTH EVERY NIGHT AT BEDTIME AS NEEDED   venlafaxine XR (EFFEXOR-XR) 75 MG 24 hr capsule TAKE 1 CAPSULE BY MOUTH DAILY  WITH BREAKFAST   apixaban (ELIQUIS) 5 MG TABS tablet Take 1 tablet (5 mg total) by mouth 2 (two) times daily. (Patient not taking: Reported on 08/10/2022)   apixaban (ELIQUIS) 5 MG TABS tablet Take 1 tablet (5 mg total) by mouth 2 (two) times daily for 21 days. (Patient not taking: Reported on 08/14/2022)   empagliflozin (JARDIANCE) 10 MG TABS tablet Take 1 tablet (10 mg total) by mouth daily. (Patient not taking: Reported on 08/10/2022)   empagliflozin (JARDIANCE) 10 MG TABS tablet Take 1 tablet (10 mg total) by mouth daily before breakfast. (Patient not taking: Reported on 08/10/2022)   famotidine (PEPCID) 20 MG tablet Take 1 tablet (  20 mg total) by mouth daily.   temazepam (RESTORIL) 7.5 MG capsule Take 7.5 mg by mouth at bedtime. (Patient not taking: Reported on 08/24/2022)   No facility-administered encounter medications on file as of 09/28/2022.     Patient Active Problem List   Diagnosis Date Noted   Hair loss 08/14/2022   Hypokalemia 05/15/2022   Acute on chronic diastolic CHF (congestive heart failure) (HCC) 05/14/2022   ABLA (acute blood loss anemia) 04/20/2022   Elevated INR 04/20/2022   Prolonged QT interval 04/20/2022   Delirium 04/19/2022   History of bacteremia  04/18/2022   COVID-19 virus infection 04/18/2022   COPD with acute exacerbation (HCC) 04/18/2022   Dementia with behavioral disturbance (HCC) 04/18/2022   MSSA bacteremia 04/04/2022   Prosthetic valve endocarditis (HCC) 04/04/2022   Pacemaker infection (HCC) 04/04/2022   Sepsis (HCC) 03/28/2022   Altered mental status 03/28/2022   Hypovolemia 03/28/2022   AKI (acute kidney injury) (HCC) 03/28/2022   Severe sepsis with acute organ dysfunction (HCC) 03/28/2022   DNR (do not resuscitate) 03/28/2022   Hypotension 03/28/2022   Rectal bleeding    Pure hypercholesterolemia 01/28/2021   Fall 12/22/2020   Knee contusion 12/22/2020   Eczema 09/09/2019   Vertigo 03/05/2019   S/P TAVR (transcatheter aortic valve replacement) 01/14/2019   Presence of permanent cardiac pacemaker    Pulmonary nodule    Severe aortic stenosis    Atrial fibrillation and flutter (HCC) 07/05/2017   DOE (dyspnea on exertion) 04/18/2017   Chronic venous insufficiency 12/08/2016   Erythema nodosum 11/10/2016   Paronychia of finger, left 04/13/2016   Left carotid artery stenosis 09/24/2014   Diarrhea 07/12/2013   Foot pain, left 03/18/2013   Gastrocnemius strain 03/18/2013   GERD (gastroesophageal reflux disease) 08/18/2011   History of pulmonary embolism 10/2008   DUODENITIS, WITHOUT HEMORRHAGE 06/29/2008   Diverticulosis of colon without hemorrhage 06/29/2008   DM type 2, controlled, with complication (HCC) 01/20/2008   Low back pain 10/15/2007   OBESITY, MORBID 07/16/2007   Grieving 05/15/2007   HLD (hyperlipidemia) 03/03/2007   Hypothyroidism 11/06/2006   Anxiety disorder 11/06/2006   CARPAL TUNNEL SYNDROME 11/06/2006   Essential hypertension 11/06/2006   HIATAL HERNIA 11/06/2006   Osteoarthritis 11/06/2006   History of endocarditis 11/06/2006     Health Maintenance Due  Topic Date Due   OPHTHALMOLOGY EXAM  Never done   FOOT EXAM  12/22/2018   COVID-19 Vaccine (3 - 2023-24 season) 12/02/2021      Review of Systems Review of Systems  Constitutional: Negative for fever, chills, diaphoresis, activity change, appetite change, fatigue and unexpected weight change.  HENT: Negative for congestion, sore throat, rhinorrhea, sneezing, trouble swallowing and sinus pressure.  Eyes: Negative for photophobia and visual disturbance.  Respiratory: Negative for cough, chest tightness, shortness of breath, wheezing and stridor.  Cardiovascular: Negative for chest pain, palpitations and leg swelling.  Gastrointestinal: Negative for nausea, vomiting, abdominal pain, diarrhea, constipation, blood in stool, abdominal distention and anal bleeding.  Genitourinary: Negative for dysuria, hematuria, flank pain and difficulty urinating.  Musculoskeletal: Negative for myalgias, back pain, joint swelling, arthralgias and gait problem.  Skin: Negative for color change, pallor, rash and wound.  Neurological: Negative for dizziness, tremors, weakness and light-headedness.  Hematological: Negative for adenopathy. Does not bruise/bleed easily.  Psychiatric/Behavioral: Negative for behavioral problems, confusion, sleep disturbance, dysphoric mood, decreased concentration and agitation.   Physical Exam   BP 105/70   Pulse (!) 49   Temp 98 F (36.7 C) (Temporal)  SpO2 96%   Physical Exam  Constitutional:  oriented to person, place, and time. appears well-developed and well-nourished. No distress.  HENT: [place]/AT, PERRLA, no scleral icterus Mouth/Throat: Oropharynx is clear and moist. No oropharyngeal exudate.  Cardiovascular: Normal rate, regular rhythm and normal heart sounds. Exam reveals no gallop and no friction rub.  No murmur heard.  Pulmonary/Chest: Effort normal and breath sounds normal. No respiratory distress.  has no wheezes.  Neck = supple, no nuchal rigidity Abdominal: Soft. Bowel sounds are normal.  exhibits no distension. There is no tenderness.  Lymphadenopathy: no cervical adenopathy. No axillary  adenopathy Neurological: alert and oriented to person, place, and time.  Skin: Skin is warm and dry. No rash noted. No erythema.  Psychiatric: a normal mood and affect.  behavior is normal.    CBC Lab Results  Component Value Date   WBC 6.7 07/14/2022   RBC 3.87 07/14/2022   HGB 11.7 07/14/2022   HCT 35.4 07/14/2022   PLT 244 07/14/2022   MCV 92 07/14/2022   MCH 30.2 07/14/2022   MCHC 33.1 07/14/2022   RDW 12.9 07/14/2022   LYMPHSABS 2,498 06/20/2022   MONOABS 1.1 (H) 05/14/2022   EOSABS 48 06/20/2022    BMET Lab Results  Component Value Date   NA 136 08/14/2022   K 4.7 08/14/2022   CL 100 08/14/2022   CO2 28 08/14/2022   GLUCOSE 165 (H) 08/14/2022   BUN 25 (H) 08/14/2022   CREATININE 0.96 08/14/2022   CALCIUM 10.1 08/14/2022   GFRNONAA 48 (L) 05/18/2022   GFRAA 63 12/16/2019    Assessment and Plan Hx of MSSA infection/TAVR and pacemaker infection = Will refill - cefadroxil 500mg  po bid for chronic suppression. Rtc in 3 months with dr vu or myself

## 2022-09-29 ENCOUNTER — Other Ambulatory Visit: Payer: Medicare Other

## 2022-09-29 NOTE — Progress Notes (Unsigned)
   09/29/2022  Patient ID: Jessica [name], female   DOB: Nov 30, 1933, 89 y.o.   MRN: [id number]  Follow-up telephone call with patient's niece, Jessica [name] [facility]), to assess for eligibility to received Eliquis PAP.  -Patient does not pay anything for medications she received from [employer], and for 2024 she has spent $75 on medications out of pocket -Based on this, she will not meet the out of pocket expenditure required for Eliquis PAP program -Educated niece, that if patient has met her deductible her monthly copay on insurance will likely be $47; niece endorses she should be able to afford that -Contacting insurance Monday to check on monthly copay -Since patient preferred not to use Eliquis samples given until she knows if she can afford to remain on therapy, niece states a provider suggested taking ASA 81mg  2 tablets daily, which she continues to take  Lenna Gilford, PharmD, DPLA

## 2022-10-02 ENCOUNTER — Telehealth: Payer: Self-pay

## 2022-10-02 ENCOUNTER — Telehealth (HOSPITAL_BASED_OUTPATIENT_CLINIC_OR_DEPARTMENT_OTHER): Payer: Self-pay | Admitting: Cardiovascular Disease

## 2022-10-02 NOTE — Telephone Encounter (Signed)
  Pt c/o medication issue:  1. Name of Medication: Jardiance  2. How are you currently taking this medication (dosage and times per day)?   3. Are you having a reaction (difficulty breathing--STAT)?   4. What is your medication issue? Jessica [name] is calling back with an update. She mentioned that the patient has been approved for patient assistance for Jardiance and would like to know how the patient will receive the medication.

## 2022-10-02 NOTE — Telephone Encounter (Signed)
Returned call to daughter and explained that medication will come from the manufacturer in the mail. Understanding verbalized.

## 2022-10-02 NOTE — Telephone Encounter (Signed)
Jardiance patient assistance approved through 04/03/23. Patient notified.

## 2022-10-02 NOTE — Progress Notes (Signed)
   10/02/2022  Patient ID: Jessica [name], female   DOB: 15-Dec-1933, 89 y.o.   MRN: [id number]  Contacted patient's insurance plan today, and she has a deductible of $435 that has yet to be satisfied.  Therefore, the copay for Eliquis would be $143 for 30 days, or $227 for 100 days via mail order.  I contacted [name] (DPR), and she states this would not be affordable for Ms. Roarty.  She states they see Gillian Shields with cardiology next week and can discuss other therapies.  As mentioned previously, patient also would not met PAP out-of-pocket expenditure requirement for Xarelto, either.    They are aware that Jardiance PAP has been approved and will be mailed directly to her home.  I told her to reach out if there were any questions/concerns upon starting this medication.  Lenna Gilford, PharmD, DPLA

## 2022-10-03 DIAGNOSIS — F02818 Dementia in other diseases classified elsewhere, unspecified severity, with other behavioral disturbance: Secondary | ICD-10-CM | POA: Diagnosis not present

## 2022-10-03 DIAGNOSIS — I358 Other nonrheumatic aortic valve disorders: Secondary | ICD-10-CM | POA: Diagnosis not present

## 2022-10-03 DIAGNOSIS — I110 Hypertensive heart disease with heart failure: Secondary | ICD-10-CM | POA: Diagnosis not present

## 2022-10-03 DIAGNOSIS — F32A Depression, unspecified: Secondary | ICD-10-CM | POA: Diagnosis not present

## 2022-10-03 DIAGNOSIS — I480 Paroxysmal atrial fibrillation: Secondary | ICD-10-CM | POA: Diagnosis not present

## 2022-10-03 DIAGNOSIS — F0284 Dementia in other diseases classified elsewhere, unspecified severity, with anxiety: Secondary | ICD-10-CM | POA: Diagnosis not present

## 2022-10-03 DIAGNOSIS — Z7901 Long term (current) use of anticoagulants: Secondary | ICD-10-CM | POA: Diagnosis not present

## 2022-10-03 DIAGNOSIS — Z792 Long term (current) use of antibiotics: Secondary | ICD-10-CM | POA: Diagnosis not present

## 2022-10-03 DIAGNOSIS — I5022 Chronic systolic (congestive) heart failure: Secondary | ICD-10-CM | POA: Diagnosis not present

## 2022-10-03 DIAGNOSIS — Z8616 Personal history of COVID-19: Secondary | ICD-10-CM | POA: Diagnosis not present

## 2022-10-03 DIAGNOSIS — J449 Chronic obstructive pulmonary disease, unspecified: Secondary | ICD-10-CM | POA: Diagnosis not present

## 2022-10-03 DIAGNOSIS — Z7982 Long term (current) use of aspirin: Secondary | ICD-10-CM | POA: Diagnosis not present

## 2022-10-03 DIAGNOSIS — Z950 Presence of cardiac pacemaker: Secondary | ICD-10-CM | POA: Diagnosis not present

## 2022-10-03 DIAGNOSIS — I442 Atrioventricular block, complete: Secondary | ICD-10-CM | POA: Diagnosis not present

## 2022-10-03 DIAGNOSIS — I5033 Acute on chronic diastolic (congestive) heart failure: Secondary | ICD-10-CM | POA: Diagnosis not present

## 2022-10-03 DIAGNOSIS — Z7984 Long term (current) use of oral hypoglycemic drugs: Secondary | ICD-10-CM | POA: Diagnosis not present

## 2022-10-03 DIAGNOSIS — I4892 Unspecified atrial flutter: Secondary | ICD-10-CM | POA: Diagnosis not present

## 2022-10-03 DIAGNOSIS — K219 Gastro-esophageal reflux disease without esophagitis: Secondary | ICD-10-CM | POA: Diagnosis not present

## 2022-10-03 DIAGNOSIS — Z86711 Personal history of pulmonary embolism: Secondary | ICD-10-CM | POA: Diagnosis not present

## 2022-10-03 DIAGNOSIS — E1140 Type 2 diabetes mellitus with diabetic neuropathy, unspecified: Secondary | ICD-10-CM | POA: Diagnosis not present

## 2022-10-03 DIAGNOSIS — M1612 Unilateral primary osteoarthritis, left hip: Secondary | ICD-10-CM | POA: Diagnosis not present

## 2022-10-03 DIAGNOSIS — F0283 Dementia in other diseases classified elsewhere, unspecified severity, with mood disturbance: Secondary | ICD-10-CM | POA: Diagnosis not present

## 2022-10-03 DIAGNOSIS — E039 Hypothyroidism, unspecified: Secondary | ICD-10-CM | POA: Diagnosis not present

## 2022-10-03 NOTE — Telephone Encounter (Addendum)
Will review option to proceed with paying $435 deductible for Eliquis vs transition to Warfarin at follow up visit.    CHA2DS2-VASc Score = 7   This indicates a 11.2% annual risk of stroke. The patient's score is based upon: CHF History: 1 HTN History: 1 Diabetes History: 1 Stroke History: 0 Vascular Disease History: 1 Age Score: 2 Gender Score: 1      Alver Sorrow, NP

## 2022-10-04 DIAGNOSIS — Z792 Long term (current) use of antibiotics: Secondary | ICD-10-CM | POA: Diagnosis not present

## 2022-10-04 DIAGNOSIS — F02818 Dementia in other diseases classified elsewhere, unspecified severity, with other behavioral disturbance: Secondary | ICD-10-CM | POA: Diagnosis not present

## 2022-10-04 DIAGNOSIS — J449 Chronic obstructive pulmonary disease, unspecified: Secondary | ICD-10-CM | POA: Diagnosis not present

## 2022-10-04 DIAGNOSIS — Z86711 Personal history of pulmonary embolism: Secondary | ICD-10-CM | POA: Diagnosis not present

## 2022-10-04 DIAGNOSIS — E1140 Type 2 diabetes mellitus with diabetic neuropathy, unspecified: Secondary | ICD-10-CM | POA: Diagnosis not present

## 2022-10-04 DIAGNOSIS — F0283 Dementia in other diseases classified elsewhere, unspecified severity, with mood disturbance: Secondary | ICD-10-CM | POA: Diagnosis not present

## 2022-10-04 DIAGNOSIS — I442 Atrioventricular block, complete: Secondary | ICD-10-CM | POA: Diagnosis not present

## 2022-10-04 DIAGNOSIS — I5022 Chronic systolic (congestive) heart failure: Secondary | ICD-10-CM | POA: Diagnosis not present

## 2022-10-04 DIAGNOSIS — Z7984 Long term (current) use of oral hypoglycemic drugs: Secondary | ICD-10-CM | POA: Diagnosis not present

## 2022-10-04 DIAGNOSIS — I480 Paroxysmal atrial fibrillation: Secondary | ICD-10-CM | POA: Diagnosis not present

## 2022-10-04 DIAGNOSIS — Z7982 Long term (current) use of aspirin: Secondary | ICD-10-CM | POA: Diagnosis not present

## 2022-10-04 DIAGNOSIS — Z950 Presence of cardiac pacemaker: Secondary | ICD-10-CM | POA: Diagnosis not present

## 2022-10-04 DIAGNOSIS — F32A Depression, unspecified: Secondary | ICD-10-CM | POA: Diagnosis not present

## 2022-10-04 DIAGNOSIS — I4892 Unspecified atrial flutter: Secondary | ICD-10-CM | POA: Diagnosis not present

## 2022-10-04 DIAGNOSIS — I358 Other nonrheumatic aortic valve disorders: Secondary | ICD-10-CM | POA: Diagnosis not present

## 2022-10-04 DIAGNOSIS — Z8616 Personal history of COVID-19: Secondary | ICD-10-CM | POA: Diagnosis not present

## 2022-10-04 DIAGNOSIS — F0284 Dementia in other diseases classified elsewhere, unspecified severity, with anxiety: Secondary | ICD-10-CM | POA: Diagnosis not present

## 2022-10-04 DIAGNOSIS — Z7901 Long term (current) use of anticoagulants: Secondary | ICD-10-CM | POA: Diagnosis not present

## 2022-10-04 DIAGNOSIS — E039 Hypothyroidism, unspecified: Secondary | ICD-10-CM | POA: Diagnosis not present

## 2022-10-04 DIAGNOSIS — K219 Gastro-esophageal reflux disease without esophagitis: Secondary | ICD-10-CM | POA: Diagnosis not present

## 2022-10-04 DIAGNOSIS — I5033 Acute on chronic diastolic (congestive) heart failure: Secondary | ICD-10-CM | POA: Diagnosis not present

## 2022-10-04 DIAGNOSIS — M1612 Unilateral primary osteoarthritis, left hip: Secondary | ICD-10-CM | POA: Diagnosis not present

## 2022-10-04 DIAGNOSIS — I110 Hypertensive heart disease with heart failure: Secondary | ICD-10-CM | POA: Diagnosis not present

## 2022-10-09 DIAGNOSIS — F0284 Dementia in other diseases classified elsewhere, unspecified severity, with anxiety: Secondary | ICD-10-CM | POA: Diagnosis not present

## 2022-10-09 DIAGNOSIS — Z7982 Long term (current) use of aspirin: Secondary | ICD-10-CM | POA: Diagnosis not present

## 2022-10-09 DIAGNOSIS — Z7901 Long term (current) use of anticoagulants: Secondary | ICD-10-CM | POA: Diagnosis not present

## 2022-10-09 DIAGNOSIS — I5033 Acute on chronic diastolic (congestive) heart failure: Secondary | ICD-10-CM | POA: Diagnosis not present

## 2022-10-09 DIAGNOSIS — I4892 Unspecified atrial flutter: Secondary | ICD-10-CM | POA: Diagnosis not present

## 2022-10-09 DIAGNOSIS — I480 Paroxysmal atrial fibrillation: Secondary | ICD-10-CM | POA: Diagnosis not present

## 2022-10-09 DIAGNOSIS — I110 Hypertensive heart disease with heart failure: Secondary | ICD-10-CM | POA: Diagnosis not present

## 2022-10-09 DIAGNOSIS — Z7984 Long term (current) use of oral hypoglycemic drugs: Secondary | ICD-10-CM | POA: Diagnosis not present

## 2022-10-09 DIAGNOSIS — I358 Other nonrheumatic aortic valve disorders: Secondary | ICD-10-CM | POA: Diagnosis not present

## 2022-10-09 DIAGNOSIS — M1612 Unilateral primary osteoarthritis, left hip: Secondary | ICD-10-CM | POA: Diagnosis not present

## 2022-10-09 DIAGNOSIS — F32A Depression, unspecified: Secondary | ICD-10-CM | POA: Diagnosis not present

## 2022-10-09 DIAGNOSIS — J449 Chronic obstructive pulmonary disease, unspecified: Secondary | ICD-10-CM | POA: Diagnosis not present

## 2022-10-09 DIAGNOSIS — E039 Hypothyroidism, unspecified: Secondary | ICD-10-CM | POA: Diagnosis not present

## 2022-10-09 DIAGNOSIS — Z950 Presence of cardiac pacemaker: Secondary | ICD-10-CM | POA: Diagnosis not present

## 2022-10-09 DIAGNOSIS — I442 Atrioventricular block, complete: Secondary | ICD-10-CM | POA: Diagnosis not present

## 2022-10-09 DIAGNOSIS — Z8616 Personal history of COVID-19: Secondary | ICD-10-CM | POA: Diagnosis not present

## 2022-10-09 DIAGNOSIS — K219 Gastro-esophageal reflux disease without esophagitis: Secondary | ICD-10-CM | POA: Diagnosis not present

## 2022-10-09 DIAGNOSIS — F0283 Dementia in other diseases classified elsewhere, unspecified severity, with mood disturbance: Secondary | ICD-10-CM | POA: Diagnosis not present

## 2022-10-09 DIAGNOSIS — I5022 Chronic systolic (congestive) heart failure: Secondary | ICD-10-CM | POA: Diagnosis not present

## 2022-10-09 DIAGNOSIS — E1140 Type 2 diabetes mellitus with diabetic neuropathy, unspecified: Secondary | ICD-10-CM | POA: Diagnosis not present

## 2022-10-09 DIAGNOSIS — F02818 Dementia in other diseases classified elsewhere, unspecified severity, with other behavioral disturbance: Secondary | ICD-10-CM | POA: Diagnosis not present

## 2022-10-09 DIAGNOSIS — Z86711 Personal history of pulmonary embolism: Secondary | ICD-10-CM | POA: Diagnosis not present

## 2022-10-09 DIAGNOSIS — Z792 Long term (current) use of antibiotics: Secondary | ICD-10-CM | POA: Diagnosis not present

## 2022-10-10 DIAGNOSIS — I5033 Acute on chronic diastolic (congestive) heart failure: Secondary | ICD-10-CM | POA: Diagnosis not present

## 2022-10-10 DIAGNOSIS — K219 Gastro-esophageal reflux disease without esophagitis: Secondary | ICD-10-CM | POA: Diagnosis not present

## 2022-10-10 DIAGNOSIS — I4892 Unspecified atrial flutter: Secondary | ICD-10-CM | POA: Diagnosis not present

## 2022-10-10 DIAGNOSIS — Z7901 Long term (current) use of anticoagulants: Secondary | ICD-10-CM | POA: Diagnosis not present

## 2022-10-10 DIAGNOSIS — I358 Other nonrheumatic aortic valve disorders: Secondary | ICD-10-CM | POA: Diagnosis not present

## 2022-10-10 DIAGNOSIS — J449 Chronic obstructive pulmonary disease, unspecified: Secondary | ICD-10-CM | POA: Diagnosis not present

## 2022-10-10 DIAGNOSIS — Z86711 Personal history of pulmonary embolism: Secondary | ICD-10-CM | POA: Diagnosis not present

## 2022-10-10 DIAGNOSIS — Z8616 Personal history of COVID-19: Secondary | ICD-10-CM | POA: Diagnosis not present

## 2022-10-10 DIAGNOSIS — I110 Hypertensive heart disease with heart failure: Secondary | ICD-10-CM | POA: Diagnosis not present

## 2022-10-10 DIAGNOSIS — Z7984 Long term (current) use of oral hypoglycemic drugs: Secondary | ICD-10-CM | POA: Diagnosis not present

## 2022-10-10 DIAGNOSIS — Z950 Presence of cardiac pacemaker: Secondary | ICD-10-CM | POA: Diagnosis not present

## 2022-10-10 DIAGNOSIS — I5022 Chronic systolic (congestive) heart failure: Secondary | ICD-10-CM | POA: Diagnosis not present

## 2022-10-10 DIAGNOSIS — Z792 Long term (current) use of antibiotics: Secondary | ICD-10-CM | POA: Diagnosis not present

## 2022-10-10 DIAGNOSIS — F32A Depression, unspecified: Secondary | ICD-10-CM | POA: Diagnosis not present

## 2022-10-10 DIAGNOSIS — F02818 Dementia in other diseases classified elsewhere, unspecified severity, with other behavioral disturbance: Secondary | ICD-10-CM | POA: Diagnosis not present

## 2022-10-10 DIAGNOSIS — F0284 Dementia in other diseases classified elsewhere, unspecified severity, with anxiety: Secondary | ICD-10-CM | POA: Diagnosis not present

## 2022-10-10 DIAGNOSIS — E039 Hypothyroidism, unspecified: Secondary | ICD-10-CM | POA: Diagnosis not present

## 2022-10-10 DIAGNOSIS — M1612 Unilateral primary osteoarthritis, left hip: Secondary | ICD-10-CM | POA: Diagnosis not present

## 2022-10-10 DIAGNOSIS — Z7982 Long term (current) use of aspirin: Secondary | ICD-10-CM | POA: Diagnosis not present

## 2022-10-10 DIAGNOSIS — I442 Atrioventricular block, complete: Secondary | ICD-10-CM | POA: Diagnosis not present

## 2022-10-10 DIAGNOSIS — I480 Paroxysmal atrial fibrillation: Secondary | ICD-10-CM | POA: Diagnosis not present

## 2022-10-10 DIAGNOSIS — F0283 Dementia in other diseases classified elsewhere, unspecified severity, with mood disturbance: Secondary | ICD-10-CM | POA: Diagnosis not present

## 2022-10-10 DIAGNOSIS — E1140 Type 2 diabetes mellitus with diabetic neuropathy, unspecified: Secondary | ICD-10-CM | POA: Diagnosis not present

## 2022-10-12 DIAGNOSIS — F0283 Dementia in other diseases classified elsewhere, unspecified severity, with mood disturbance: Secondary | ICD-10-CM | POA: Diagnosis not present

## 2022-10-12 DIAGNOSIS — I442 Atrioventricular block, complete: Secondary | ICD-10-CM | POA: Diagnosis not present

## 2022-10-12 DIAGNOSIS — E039 Hypothyroidism, unspecified: Secondary | ICD-10-CM | POA: Diagnosis not present

## 2022-10-12 DIAGNOSIS — E1140 Type 2 diabetes mellitus with diabetic neuropathy, unspecified: Secondary | ICD-10-CM | POA: Diagnosis not present

## 2022-10-12 DIAGNOSIS — F32A Depression, unspecified: Secondary | ICD-10-CM | POA: Diagnosis not present

## 2022-10-12 DIAGNOSIS — I480 Paroxysmal atrial fibrillation: Secondary | ICD-10-CM | POA: Diagnosis not present

## 2022-10-12 DIAGNOSIS — I358 Other nonrheumatic aortic valve disorders: Secondary | ICD-10-CM | POA: Diagnosis not present

## 2022-10-12 DIAGNOSIS — Z950 Presence of cardiac pacemaker: Secondary | ICD-10-CM | POA: Diagnosis not present

## 2022-10-12 DIAGNOSIS — I5033 Acute on chronic diastolic (congestive) heart failure: Secondary | ICD-10-CM | POA: Diagnosis not present

## 2022-10-12 DIAGNOSIS — F0284 Dementia in other diseases classified elsewhere, unspecified severity, with anxiety: Secondary | ICD-10-CM | POA: Diagnosis not present

## 2022-10-12 DIAGNOSIS — F02818 Dementia in other diseases classified elsewhere, unspecified severity, with other behavioral disturbance: Secondary | ICD-10-CM | POA: Diagnosis not present

## 2022-10-12 DIAGNOSIS — Z8616 Personal history of COVID-19: Secondary | ICD-10-CM | POA: Diagnosis not present

## 2022-10-12 DIAGNOSIS — I110 Hypertensive heart disease with heart failure: Secondary | ICD-10-CM | POA: Diagnosis not present

## 2022-10-12 DIAGNOSIS — M1612 Unilateral primary osteoarthritis, left hip: Secondary | ICD-10-CM | POA: Diagnosis not present

## 2022-10-12 DIAGNOSIS — Z7984 Long term (current) use of oral hypoglycemic drugs: Secondary | ICD-10-CM | POA: Diagnosis not present

## 2022-10-12 DIAGNOSIS — Z7982 Long term (current) use of aspirin: Secondary | ICD-10-CM | POA: Diagnosis not present

## 2022-10-12 DIAGNOSIS — K219 Gastro-esophageal reflux disease without esophagitis: Secondary | ICD-10-CM | POA: Diagnosis not present

## 2022-10-12 DIAGNOSIS — I5022 Chronic systolic (congestive) heart failure: Secondary | ICD-10-CM | POA: Diagnosis not present

## 2022-10-12 DIAGNOSIS — I4892 Unspecified atrial flutter: Secondary | ICD-10-CM | POA: Diagnosis not present

## 2022-10-12 DIAGNOSIS — J449 Chronic obstructive pulmonary disease, unspecified: Secondary | ICD-10-CM | POA: Diagnosis not present

## 2022-10-12 DIAGNOSIS — Z7901 Long term (current) use of anticoagulants: Secondary | ICD-10-CM | POA: Diagnosis not present

## 2022-10-12 DIAGNOSIS — Z86711 Personal history of pulmonary embolism: Secondary | ICD-10-CM | POA: Diagnosis not present

## 2022-10-12 DIAGNOSIS — Z792 Long term (current) use of antibiotics: Secondary | ICD-10-CM | POA: Diagnosis not present

## 2022-10-13 ENCOUNTER — Encounter (HOSPITAL_BASED_OUTPATIENT_CLINIC_OR_DEPARTMENT_OTHER): Payer: Self-pay | Admitting: Family

## 2022-10-13 ENCOUNTER — Ambulatory Visit (HOSPITAL_BASED_OUTPATIENT_CLINIC_OR_DEPARTMENT_OTHER): Payer: Medicare Other | Admitting: Family

## 2022-10-13 VITALS — BP 124/60 | HR 71 | Ht 62.0 in | Wt 227.0 lb

## 2022-10-13 DIAGNOSIS — Z79899 Other long term (current) drug therapy: Secondary | ICD-10-CM

## 2022-10-13 DIAGNOSIS — I5042 Chronic combined systolic (congestive) and diastolic (congestive) heart failure: Secondary | ICD-10-CM

## 2022-10-13 DIAGNOSIS — I480 Paroxysmal atrial fibrillation: Secondary | ICD-10-CM | POA: Diagnosis not present

## 2022-10-13 DIAGNOSIS — Z950 Presence of cardiac pacemaker: Secondary | ICD-10-CM

## 2022-10-13 DIAGNOSIS — Z952 Presence of prosthetic heart valve: Secondary | ICD-10-CM | POA: Diagnosis not present

## 2022-10-13 NOTE — Patient Instructions (Addendum)
Medication Instructions:  Your physician has recommended you make the following change in your medication:   Stop: Eliquis   Start: Jardiance  *If you need a refill on your cardiac medications before your next appointment, please call your pharmacy*  Follow-Up: At [facility], you and your health needs are our priority.  As part of our continuing mission to provide you with exceptional heart care, we have created designated Provider Care Teams.  These Care Teams include your primary Cardiologist (physician) and Advanced Practice Providers (APPs -  Physician Assistants and Nurse Practitioners) who all work together to provide you with the care you need, when you need it.  We recommend signing up for the patient portal called "MyChart".  Sign up information is provided on this After Visit Summary.  MyChart is used to connect with patients for Virtual Visits (Telemedicine).  Patients are able to view lab/test results, encounter notes, upcoming appointments, etc.  Non-urgent messages can be sent to your provider as well.   To learn more about what you can do with MyChart, go to [email or web address].    Your next appointment:   Please schedule Dr. Lalla Brothers in October   &   Gillian Shields, NP in November

## 2022-10-13 NOTE — Progress Notes (Signed)
Cardiology Office Note:  .   Date:  10/13/2022  ID:  Jessica [name], DOB Aug 11, 1933, MRN [id number] PCP: Tresa Garter, MD  [facility] Providers Cardiologist:  Chilton Si, MD Electrophysiologist:  Lanier Prude, MD    History of Present Illness: .   Jessica [name] is a 89 y.o. female with a hx of nonobstructive CAD, severe AS s/p TAVR with endocarditis, CHB s/p PPM, atrial fibrillation s/p AV ablation 2019, HTN, HLD, DM2, chronic systolic and diastolic heart failure last seen 07/14/22.   Admitted 04/18/2022 with endocarditis with 3 cm vegetation on TAVR prosthesis by TEE 04/05/2022.  No lead vegetation noted.  Inadvertent finding of AKI in the setting of COVID.  On indefinite oral antibiotics.   Admitted 2/11 - 05/19/2022 with acute on chronic diastolic heart failure.  Diuresed 5.9 L.  On discharge oral Lasix, Jardiance, Entresto, metoprolol, Aldactone.  Recommended for discharge to SNF.  Seen 07/14/22 at which time Jardiance and Eliquis patient assistance were initiated. She was concerned about taking either medication without assistance. Assistance approved for Jardiance. It was denied for Eliquis.    Pleasant lady who lives at home independently. Her niece is present at clinic visit today. Notes her endurance is not back to baseline but continues to work with [facility] PT/OT and feels it is improving. Using cane or wheelchair at home. She got Jardiance in the mail but has not yet started she was concerned about taking it with metformin-reassurance provided that this was safe.  She is agreeable to start.  She presently has to pay her out-of-pocket deductible for Medicare of remaining $435 prior to Eliquis dropping to lower price.  This would mean her Eliquis to be $143 for 30 days or $227 for 100 days.  We discussed at length her atrial fibrillation, stroke risk and that aspirin would not adequately protect from stroke in the setting of atrial fibrillation.  We discussed  options including paying for Eliquis, transition to warfarin which she is taken previously.  She politely declines anticoagulant and verbalizes understanding of stroke risk.   ROS: Please see the history of present illness.    All other systems reviewed and are negative.   Studies Reviewed: .        Cardiac Studies & Procedures   CARDIAC CATHETERIZATION  CARDIAC CATHETERIZATION 12/30/2018  Narrative  Prox RCA lesion is 40% stenosed.  Dist RCA lesion is 30% stenosed.  1. Mild non-obstructive CAD 2. Severe aortic valve stenosis (mean gradient 35 mmHg, Peak to peak gradient 44 mmHg, AVA 0.78 cm2).  Will continue workup for TAVR.  Findings Coronary Findings Diagnostic  Dominance: Right  Left Anterior Descending Vessel is large.  Left Circumflex Vessel is moderate in size.  Right Coronary Artery Vessel is large. Prox RCA lesion is 40% stenosed. Dist RCA lesion is 30% stenosed.  Intervention  No interventions have been documented.     ECHOCARDIOGRAM  ECHOCARDIOGRAM COMPLETE 03/29/2022  Narrative ECHOCARDIOGRAM REPORT    Patient Name:   Jessica [name] Date of Exam: 03/29/2022 Medical Rec #:  [id number]          Height:       65.0 in Accession #:    [id number]         Weight:       240.0 lb Date of Birth:  1933/08/30          BSA:          2.138 m Patient Age:    3  years           BP:           119/46 mmHg Patient Gender: F                  HR:           82 bpm. Exam Location:  Inpatient  Procedure: 2D Echo, Cardiac Doppler, Color Doppler and Intracardiac Opacification Agent  Indications:    Congestive Heart Failure I50.9  History:        Patient has prior history of Echocardiogram examinations, most recent 01/07/2020. Pacemaker, Arrythmias:Atrial Flutter and Atrial Fibrillation; Risk Factors:Diabetes, Hypertension, Dyslipidemia and GERD. Aortic Valve: 26 mm Sapien prosthetic, stented (TAVR) valve is present in the aortic position. Procedure Date:  01/14/19.  Sonographer:    Eulah Pont RDCS Referring Phys: [id number] Elenore Paddy REESE   Sonographer Comments: Patient is obese. IMPRESSIONS   1. The aortic valve has been repaired/replaced. There is a 26 mm Sapien prosthetic (TAVR) valve present in the aortic position. Procedure Date: 01/14/19. Aortic valve mean gradient measures 15.0 mmHg. Aortic valve Vmax measures 2.61 m/s. EOA 1.9cm2, DI 0.5. There is mild paravalvular leak located at the 2 o'clock position on PSAX view. 2. Left ventricular ejection fraction, by estimation, is 55 to 60%. The left ventricle has normal function. Septal motion is paradoxical in the setting of RV pacing. There is moderate asymmetric left ventricular hypertrophy of the basal-septal segment. Left ventricular diastolic parameters are consistent with Grade II diastolic dysfunction (pseudonormalization). 3. Right ventricular systolic function is normal. The right ventricular size is normal. There is normal pulmonary artery systolic pressure. The estimated right ventricular systolic pressure is 35.2 mmHg. 4. Left atrial size was mildly dilated. 5. The mitral valve is degenerative. Trivial mitral valve regurgitation. Moderate mitral annular calcification. 6. Aortic dilatation noted. There is borderline dilatation of the ascending aorta, measuring 37 mm. 7. The inferior vena cava is dilated in size with >50% respiratory variability, suggesting right atrial pressure of 8 mmHg.  Comparison(s): Compared to prior TTE in 2021, the mean aortic gradient is 15 from but EOA similar at ~2cm2.  FINDINGS Left Ventricle: Left ventricular ejection fraction, by estimation, is 55 to 60%. The left ventricle has normal function. The left ventricle has no regional wall motion abnormalities. Definity contrast agent was given IV to delineate the left ventricular endocardial borders. The left ventricular internal cavity size was normal in size. There is moderate asymmetric left  ventricular hypertrophy of the basal-septal segment. Abnormal (paradoxical) septal motion, consistent with RV pacemaker. Left ventricular diastolic parameters are consistent with Grade II diastolic dysfunction (pseudonormalization).  Right Ventricle: The right ventricular size is normal. Right vetricular wall thickness was not well visualized. Right ventricular systolic function is normal. There is normal pulmonary artery systolic pressure. The tricuspid regurgitant velocity is 2.61 m/s, and with an assumed right atrial pressure of 8 mmHg, the estimated right ventricular systolic pressure is 35.2 mmHg.  Left Atrium: Left atrial size was mildly dilated.  Right Atrium: Right atrial size was normal in size.  Pericardium: There is no evidence of pericardial effusion.  Mitral Valve: The mitral valve is degenerative in appearance. There is mild thickening of the mitral valve leaflet(s). There is mild calcification of the mitral valve leaflet(s). Moderate mitral annular calcification. Trivial mitral valve regurgitation.  Tricuspid Valve: The tricuspid valve is normal in structure. Tricuspid valve regurgitation is mild.  Aortic Valve: Mild paravalvular leak located at the 2o'clock position on PSAX view.  DI 0.5. The aortic valve has been repaired/replaced. Aortic valve mean gradient measures 15.0 mmHg. Aortic valve peak gradient measures 27.2 mmHg. EOA 1.9cm2. There is a 26 mm Sapien prosthetic, stented (TAVR) valve present in the aortic position. Procedure Date: 01/14/19.  Pulmonic Valve: The pulmonic valve was normal in structure. Pulmonic valve regurgitation is trivial.  Aorta: Aortic dilatation noted. There is borderline dilatation of the ascending aorta, measuring 37 mm.  Venous: The inferior vena cava is dilated in size with greater than 50% respiratory variability, suggesting right atrial pressure of 8 mmHg.  IAS/Shunts: The atrial septum is grossly normal.  Additional Comments: A device  lead is visualized.   LEFT VENTRICLE PLAX 2D LVIDd:         5.20 cm     Diastology LVIDs:         3.00 cm     LV e' medial:    4.56 cm/s LV PW:         0.80 cm     LV E/e' medial:  34.2 LV IVS:        0.80 cm     LV e' lateral:   4.56 cm/s LVOT diam:     2.60 cm     LV E/e' lateral: 34.2 LV SV:         127 LV SV Index:   60 LVOT Area:     5.31 cm  LV Volumes (MOD) LV vol d, MOD A2C: 75.8 ml LV vol d, MOD A4C: 72.9 ml LV vol s, MOD A2C: 34.8 ml LV vol s, MOD A4C: 32.8 ml LV SV MOD A2C:     41.0 ml LV SV MOD A4C:     72.9 ml LV SV MOD BP:      41.9 ml  RIGHT VENTRICLE RV S prime:     14.90 cm/s TAPSE (M-mode): 1.8 cm  LEFT ATRIUM             Index        RIGHT ATRIUM           Index LA diam:        5.00 cm 2.34 cm/m   RA Area:     16.90 cm LA Vol (A2C):   72.9 ml 34.11 ml/m  RA Volume:   41.40 ml  19.37 ml/m LA Vol (A4C):   81.4 ml 38.08 ml/m LA Biplane Vol: 77.1 ml 36.07 ml/m AORTIC VALVE AV Area (Vmax):    2.56 cm AV Area (Vmean):   2.46 cm AV Area (VTI):     2.68 cm AV Vmax:           261.00 cm/s AV Vmean:          185.667 cm/s AV VTI:            0.475 m AV Peak Grad:      27.2 mmHg AV Mean Grad:      15.0 mmHg LVOT Vmax:         126.00 cm/s LVOT Vmean:        85.900 cm/s LVOT VTI:          0.240 m LVOT/AV VTI ratio: 0.50  AORTA Ao Root diam: 3.30 cm Ao Asc diam:  3.70 cm  MITRAL VALVE                TRICUSPID VALVE MV Area (PHT): 3.91 cm     TR Peak grad:   27.2 mmHg MV Decel Time: 194 msec  TR Vmax:        261.00 cm/s MV E velocity: 156.00 cm/s MV A velocity: 84.60 cm/s   SHUNTS MV E/A ratio:  1.84         Systemic VTI:  0.24 m Systemic Diam: 2.60 cm  Laurance Flatten MD Electronically signed by Laurance Flatten MD Signature Date/Time: 03/29/2022/11:52:14 AM    Final   TEE  ECHO TEE 04/05/2022  Narrative TRANSESOPHOGEAL ECHO REPORT    Patient Name:   FION GRAUMAN Date of Exam: 04/05/2022 Medical Rec #:  [id number]           Height:       62.0 in Accession #:    [id number]         Weight:       263.0 lb Date of Birth:  [birth year]          BSA:          2.148 m Patient Age:    88 years           BP:           110/53 mmHg Patient Gender: F                  HR:           92 bpm. Exam Location:  Inpatient  Procedure: 3D Echo, Transesophageal Echo, Cardiac Doppler and Color Doppler  Indications:     Endocarditis  History:         Patient has prior history of Echocardiogram examinations, most recent 03/29/2022. Pacemaker and Abnormal ECG, Aortic Valve Disease, Arrythmias:Atrial Fibrillation, Signs/Symptoms:Bacteremia, Altered Mental Status, Dyspnea and Shortness of Breath; Risk Factors:Hypertension, Diabetes and Dyslipidemia. Aortic stenosis. Aortic Valve: 26 mm Sapien prosthetic, stented (TAVR) valve is present in the aortic position. Procedure Date: 01/14/2019.  Sonographer:     Sheralyn Boatman RDCS Referring Phys:  [id number] HAO MENG Diagnosing Phys: Mary [place]  PROCEDURE: After discussion of the risks and benefits of a TEE, an informed consent was obtained from the patient. The transesophogeal probe was passed without difficulty through the esophogus of the patient. Imaged were obtained with the patient in a left lateral decubitus position. Sedation performed by different physician. The patient was monitored while under deep sedation. Anesthestetic sedation was provided intravenously by Anesthesiology: 294mg  of Propofol, 100mg  of Lidocaine. The patient's vital signs; including heart rate, blood pressure, and oxygen saturation; remained stable throughout the procedure. The patient developed no complications during the procedure.  IMPRESSIONS   1. Left ventricular ejection fraction, by estimation, is 55 to 60%. The left ventricle has normal function. 2. No vegetation seen on the device lead. Right ventricular systolic function is normal. The right ventricular size is normal. 3. No left atrial/left atrial appendage  thrombus was detected. 4. Moderate pleural effusion. 5. The mitral valve is degenerative. Trivial mitral valve regurgitation. Moderate mitral annular calcification. 6. 26 mm Sapien prosthetic valve, procedure date 01/14/2019. 3 cm x 0.3 cm vegetation on the TAVR on the RCC (seen best image 81). No distinct abscess. Mild PVL. No significant rocking or dehiscence. There is a 26 mm Sapien prosthetic (TAVR) valve present in the aortic position. Procedure Date: 01/14/2019. 7. There is mild (Grade II) plaque.  Conclusion(s)/Recommendation(s): 3cm vegetation seen on the TAVR prosthesis. No vegetation seen on the device lead or the native cardiac valves.  FINDINGS Left Ventricle: Left ventricular ejection fraction, by estimation, is 55 to 60%. The left ventricle has normal function. The left ventricular internal cavity  size was normal in size.  Right Ventricle: No vegetation seen on the device lead. The right ventricular size is normal. Right ventricular systolic function is normal.  Left Atrium: No left atrial/left atrial appendage thrombus was detected.  Mitral Valve: The mitral valve is degenerative in appearance. Moderate mitral annular calcification. Trivial mitral valve regurgitation.  Tricuspid Valve: Tricuspid valve regurgitation is mild.  Aortic Valve: 26 mm Sapien prosthetic valve, procedure date 01/14/2019. 3 cm x 0.3 cm vegetation on the TAVR on the RCC (seen best image 81). No distinct abscess. Mild PVL. No significant rocking or dehiscence. There is a 26 mm Sapien prosthetic, stented (TAVR) valve present in the aortic position. Procedure Date: 01/14/2019.  Pulmonic Valve: Pulmonic valve regurgitation is trivial.  Aorta: There is mild (Grade II) plaque.  IAS/Shunts: No atrial level shunt detected by color flow Doppler.  Additional Comments: A device lead is visualized. There is a moderate pleural effusion. Spectral Doppler performed.  Carolan Clines Electronically signed by Carolan Clines Signature Date/Time: 04/05/2022/12:04:22 PM    Final (Updated)    CT SCANS  CT CORONARY MORPH W/CTA COR W/SCORE 01/07/2019  Addendum 01/07/2019 10:55 PM ADDENDUM REPORT: 01/07/2019 22:52  CLINICAL DATA:  37 -year-old female with severe aortic stenosis being evaluated for a TAVR procedure.  EXAM: Cardiac TAVR CT  TECHNIQUE: The patient was scanned on a [employer]. A 120 kV retrospective scan was triggered in the descending thoracic aorta at 111 HU's. Gantry rotation speed was 250 msecs and collimation was .6 mm. No beta blockade or nitro were given. The 3D data set was reconstructed in 5% intervals of the R-R cycle. Systolic and diastolic phases were analyzed on a dedicated work station using MPR, MIP and VRT modes. The patient received 80 cc of contrast.  FINDINGS: Aortic Root:  Aortic valve: Trileaflet  Aortic valve calcium score: 2250  Aortic annulus:  Diameter: 29mm x 24mm  Perimeter: 81mm  Area: 496 mm^2  Calcifications: No calcifications  Coronary height: Min Left - 17mm, Max Left - 21mm; Min Right - 13mm  Sinotubular height: Left cusp - 23mm; Right cusp - 21mm; Noncoronary cusp - 22mm  LVOT (as measured 3 mm below the annulus):  Diameter: 30mm x 23mm  Area: 508 mm^2  Calcifications: No calcifications  Aortic sinus width: Left cusp - 34mm; Right cusp - 35mm; Noncoronary cusp - 35mm  Sinotubular junction width: 31mm x 29mm  Optimum Fluoroscopic Angle for Delivery (centered on right coronary cusp): LAO 1 CRA 3  Cardiac:  Right atrium: Normal size  Right ventricle: Normal size  Pulmonary arteries: Normal size  Pulmonary veins: Normal configuration  Left atrium: Moderate enlargement  Left ventricle: Normal size  Pericardium: Normal thickness  Coronary arteries: Moderate calcifications  IMPRESSION: 1.  Severely calcified trileaflet aortic valve (calcium score 2250)  2. Aortic annulus measures 29mm x 24mm with  perimeter 81mm and area 496 mm^2. No annular or LVOT calcifications  3. Sufficient coronary to annulus distance: 17mm to left main, 13mm to right coronary artery  4. Optimum Fluoroscopic Angle for Delivery (centered on right coronary cusp): LAO 1 CRA 3   Electronically Signed By: Epifanio Lesches MD On: 01/07/2019 22:52  Narrative EXAM: OVER-READ INTERPRETATION  CT CHEST  The following report is an over-read performed by radiologist Dr. Trudie Reed of [facility] on 01/07/2019. This over-read does not include interpretation of cardiac or coronary anatomy or pathology. The coronary calcium score/coronary CTA interpretation by the cardiologist is attached.  COMPARISON:  Chest CTA 10/07/2008.  FINDINGS: Extracardiac findings were described separately under dictation for contemporaneously obtained CTA chest, abdomen and pelvis.  IMPRESSION: Please see separate dictation for contemporaneously obtained CTA chest, abdomen and pelvis dated 01/07/2019 for full description of relevant extracardiac findings.  Electronically Signed: By: Trudie Reed M.D. On: 01/07/2019 15:05          Risk Assessment/Calculations:    CHA2DS2-VASc Score = 7   This indicates a 11.2% annual risk of stroke. The patient's score is based upon: CHF History: 1 HTN History: 1 Diabetes History: 1 Stroke History: 0 Vascular Disease History: 1 Age Score: 2 Gender Score: 1            Physical Exam:   VS:  BP 124/60   Pulse 71   Ht 5\' 2"  (1.575 m)   Wt 227 lb (103 kg)   BMI 41.52 kg/m    Wt Readings from Last 3 Encounters:  10/13/22 227 lb (103 kg)  09/19/22 222 lb (100.7 kg)  08/14/22 219 lb (99.3 kg)    GEN: Well nourished, overweight, well developed in no acute distress NECK: No JVD; No carotid bruits CARDIAC: RRR, no murmurs, rubs, gallops RESPIRATORY:  Clear to auscultation without rales, wheezing or rhonchi  ABDOMEN: Soft, non-tender,  non-distended EXTREMITIES:  No edema; No deformity   ASSESSMENT AND PLAN: .    Medication management -has not yet met deductible for medications through Medicare and has remaining $435 prior to Eliquis dropping to lower price. This would mean her Eliquis to be $143 for 30 days or $227 for 100 days.  We discussed at length her atrial fibrillation, stroke risk and that aspirin would not adequately protect from stroke in the setting of atrial fibrillation.  We discussed options including paying for Eliquis, transition to warfarin which she is taken previously.  She politely declines anticoagulant and verbalizes understanding of stroke risk.   Atrial fib s/p ablation / Hypercoaguable state - NSR by auscultation. Denies palpitations. Continue Lopressor 25mg  BID. Previously declined Watchman. CHA2DS2-VASc Score = 7 [CHF History: 1, HTN History: 1, Diabetes History: 1, Stroke History: 0, Vascular Disease History: 1, Age Score: 2, Gender Score: 1].  Therefore, the patient's annual risk of stroke is 11.2 %.  Offered Warfarin as Eliquis cost prohibitive. She politely declines anticoagulant and verbalizes understanding of stroke risk.   Chronic systolic and diastolic heart failure -Grossly euvolemic on exam.  Continue GDMT Entresto, spironolactone, furosemide, metoprolol, Jardiance.  Low sodium diet, fluid restriction <2L, and daily weights encouraged. Educated to contact our office for weight gain of 2 lbs overnight or 5 lbs in one week.   S/p TAVR / Endocarditis - Completed IV abx now on indefinite PO abx per ID. No lead vegetation per imaging. Per EP, only plan to extract PPM if needs valvular intervention. Is PPM dependent.  CHB s/p PPM - Follows with EP.       Dispo: follow up with EP In October and Dr. Duke Salvia or APP in November  Signed, Alver Sorrow, NP

## 2022-10-15 ENCOUNTER — Other Ambulatory Visit: Payer: Self-pay | Admitting: Internal Medicine

## 2022-10-15 ENCOUNTER — Other Ambulatory Visit (HOSPITAL_BASED_OUTPATIENT_CLINIC_OR_DEPARTMENT_OTHER): Payer: Self-pay | Admitting: Cardiovascular Disease

## 2022-10-15 ENCOUNTER — Other Ambulatory Visit (HOSPITAL_BASED_OUTPATIENT_CLINIC_OR_DEPARTMENT_OTHER): Payer: Self-pay | Admitting: Cardiology

## 2022-10-16 DIAGNOSIS — J449 Chronic obstructive pulmonary disease, unspecified: Secondary | ICD-10-CM | POA: Diagnosis not present

## 2022-10-16 DIAGNOSIS — Z7901 Long term (current) use of anticoagulants: Secondary | ICD-10-CM | POA: Diagnosis not present

## 2022-10-16 DIAGNOSIS — Z950 Presence of cardiac pacemaker: Secondary | ICD-10-CM | POA: Diagnosis not present

## 2022-10-16 DIAGNOSIS — F02818 Dementia in other diseases classified elsewhere, unspecified severity, with other behavioral disturbance: Secondary | ICD-10-CM | POA: Diagnosis not present

## 2022-10-16 DIAGNOSIS — Z7982 Long term (current) use of aspirin: Secondary | ICD-10-CM | POA: Diagnosis not present

## 2022-10-16 DIAGNOSIS — I5022 Chronic systolic (congestive) heart failure: Secondary | ICD-10-CM | POA: Diagnosis not present

## 2022-10-16 DIAGNOSIS — I4892 Unspecified atrial flutter: Secondary | ICD-10-CM | POA: Diagnosis not present

## 2022-10-16 DIAGNOSIS — E1140 Type 2 diabetes mellitus with diabetic neuropathy, unspecified: Secondary | ICD-10-CM | POA: Diagnosis not present

## 2022-10-16 DIAGNOSIS — I358 Other nonrheumatic aortic valve disorders: Secondary | ICD-10-CM | POA: Diagnosis not present

## 2022-10-16 DIAGNOSIS — I480 Paroxysmal atrial fibrillation: Secondary | ICD-10-CM | POA: Diagnosis not present

## 2022-10-16 DIAGNOSIS — I442 Atrioventricular block, complete: Secondary | ICD-10-CM | POA: Diagnosis not present

## 2022-10-16 DIAGNOSIS — F32A Depression, unspecified: Secondary | ICD-10-CM | POA: Diagnosis not present

## 2022-10-16 DIAGNOSIS — E039 Hypothyroidism, unspecified: Secondary | ICD-10-CM | POA: Diagnosis not present

## 2022-10-16 DIAGNOSIS — Z86711 Personal history of pulmonary embolism: Secondary | ICD-10-CM | POA: Diagnosis not present

## 2022-10-16 DIAGNOSIS — F0284 Dementia in other diseases classified elsewhere, unspecified severity, with anxiety: Secondary | ICD-10-CM | POA: Diagnosis not present

## 2022-10-16 DIAGNOSIS — K219 Gastro-esophageal reflux disease without esophagitis: Secondary | ICD-10-CM | POA: Diagnosis not present

## 2022-10-16 DIAGNOSIS — I5033 Acute on chronic diastolic (congestive) heart failure: Secondary | ICD-10-CM | POA: Diagnosis not present

## 2022-10-16 DIAGNOSIS — Z7984 Long term (current) use of oral hypoglycemic drugs: Secondary | ICD-10-CM | POA: Diagnosis not present

## 2022-10-16 DIAGNOSIS — I110 Hypertensive heart disease with heart failure: Secondary | ICD-10-CM | POA: Diagnosis not present

## 2022-10-16 DIAGNOSIS — F0283 Dementia in other diseases classified elsewhere, unspecified severity, with mood disturbance: Secondary | ICD-10-CM | POA: Diagnosis not present

## 2022-10-16 DIAGNOSIS — Z8616 Personal history of COVID-19: Secondary | ICD-10-CM | POA: Diagnosis not present

## 2022-10-16 DIAGNOSIS — M1612 Unilateral primary osteoarthritis, left hip: Secondary | ICD-10-CM | POA: Diagnosis not present

## 2022-10-16 DIAGNOSIS — Z792 Long term (current) use of antibiotics: Secondary | ICD-10-CM | POA: Diagnosis not present

## 2022-10-17 ENCOUNTER — Telehealth: Payer: Self-pay | Admitting: Internal Medicine

## 2022-10-17 DIAGNOSIS — I358 Other nonrheumatic aortic valve disorders: Secondary | ICD-10-CM | POA: Diagnosis not present

## 2022-10-17 DIAGNOSIS — F0284 Dementia in other diseases classified elsewhere, unspecified severity, with anxiety: Secondary | ICD-10-CM | POA: Diagnosis not present

## 2022-10-17 DIAGNOSIS — I5033 Acute on chronic diastolic (congestive) heart failure: Secondary | ICD-10-CM | POA: Diagnosis not present

## 2022-10-17 DIAGNOSIS — F02818 Dementia in other diseases classified elsewhere, unspecified severity, with other behavioral disturbance: Secondary | ICD-10-CM | POA: Diagnosis not present

## 2022-10-17 DIAGNOSIS — F32A Depression, unspecified: Secondary | ICD-10-CM | POA: Diagnosis not present

## 2022-10-17 DIAGNOSIS — E1140 Type 2 diabetes mellitus with diabetic neuropathy, unspecified: Secondary | ICD-10-CM | POA: Diagnosis not present

## 2022-10-17 DIAGNOSIS — I480 Paroxysmal atrial fibrillation: Secondary | ICD-10-CM | POA: Diagnosis not present

## 2022-10-17 DIAGNOSIS — Z7901 Long term (current) use of anticoagulants: Secondary | ICD-10-CM | POA: Diagnosis not present

## 2022-10-17 DIAGNOSIS — I110 Hypertensive heart disease with heart failure: Secondary | ICD-10-CM | POA: Diagnosis not present

## 2022-10-17 DIAGNOSIS — Z950 Presence of cardiac pacemaker: Secondary | ICD-10-CM | POA: Diagnosis not present

## 2022-10-17 DIAGNOSIS — I442 Atrioventricular block, complete: Secondary | ICD-10-CM | POA: Diagnosis not present

## 2022-10-17 DIAGNOSIS — Z8616 Personal history of COVID-19: Secondary | ICD-10-CM | POA: Diagnosis not present

## 2022-10-17 DIAGNOSIS — K219 Gastro-esophageal reflux disease without esophagitis: Secondary | ICD-10-CM | POA: Diagnosis not present

## 2022-10-17 DIAGNOSIS — I5022 Chronic systolic (congestive) heart failure: Secondary | ICD-10-CM | POA: Diagnosis not present

## 2022-10-17 DIAGNOSIS — J449 Chronic obstructive pulmonary disease, unspecified: Secondary | ICD-10-CM | POA: Diagnosis not present

## 2022-10-17 DIAGNOSIS — E039 Hypothyroidism, unspecified: Secondary | ICD-10-CM | POA: Diagnosis not present

## 2022-10-17 DIAGNOSIS — Z86711 Personal history of pulmonary embolism: Secondary | ICD-10-CM | POA: Diagnosis not present

## 2022-10-17 DIAGNOSIS — M1612 Unilateral primary osteoarthritis, left hip: Secondary | ICD-10-CM | POA: Diagnosis not present

## 2022-10-17 DIAGNOSIS — I4892 Unspecified atrial flutter: Secondary | ICD-10-CM | POA: Diagnosis not present

## 2022-10-17 DIAGNOSIS — Z7984 Long term (current) use of oral hypoglycemic drugs: Secondary | ICD-10-CM | POA: Diagnosis not present

## 2022-10-17 DIAGNOSIS — F0283 Dementia in other diseases classified elsewhere, unspecified severity, with mood disturbance: Secondary | ICD-10-CM | POA: Diagnosis not present

## 2022-10-17 DIAGNOSIS — Z792 Long term (current) use of antibiotics: Secondary | ICD-10-CM | POA: Diagnosis not present

## 2022-10-17 DIAGNOSIS — Z7982 Long term (current) use of aspirin: Secondary | ICD-10-CM | POA: Diagnosis not present

## 2022-10-17 NOTE — Telephone Encounter (Signed)
Called [facility] back no answer LMOM ok for verbal. Pt is in compliance w/ appts.Marland KitchenRaechel Chute

## 2022-10-17 NOTE — Telephone Encounter (Signed)
Caller & What Company:  Jessica [name] - [facility]   Phone Number:  [phone]   Needs Verbal orders for what service & frequency:  New stage one pressure sore on bottom.  Would like to apply zinc and cover with a foam dressing twice a week.  Increase nursing frequency to 2 times a week.

## 2022-10-18 NOTE — Telephone Encounter (Signed)
Okay.  Thanks.

## 2022-10-19 DIAGNOSIS — F0284 Dementia in other diseases classified elsewhere, unspecified severity, with anxiety: Secondary | ICD-10-CM | POA: Diagnosis not present

## 2022-10-19 DIAGNOSIS — Z950 Presence of cardiac pacemaker: Secondary | ICD-10-CM | POA: Diagnosis not present

## 2022-10-19 DIAGNOSIS — E039 Hypothyroidism, unspecified: Secondary | ICD-10-CM | POA: Diagnosis not present

## 2022-10-19 DIAGNOSIS — Z792 Long term (current) use of antibiotics: Secondary | ICD-10-CM | POA: Diagnosis not present

## 2022-10-19 DIAGNOSIS — I480 Paroxysmal atrial fibrillation: Secondary | ICD-10-CM | POA: Diagnosis not present

## 2022-10-19 DIAGNOSIS — F02818 Dementia in other diseases classified elsewhere, unspecified severity, with other behavioral disturbance: Secondary | ICD-10-CM | POA: Diagnosis not present

## 2022-10-19 DIAGNOSIS — Z8616 Personal history of COVID-19: Secondary | ICD-10-CM | POA: Diagnosis not present

## 2022-10-19 DIAGNOSIS — K219 Gastro-esophageal reflux disease without esophagitis: Secondary | ICD-10-CM | POA: Diagnosis not present

## 2022-10-19 DIAGNOSIS — E1140 Type 2 diabetes mellitus with diabetic neuropathy, unspecified: Secondary | ICD-10-CM | POA: Diagnosis not present

## 2022-10-19 DIAGNOSIS — I110 Hypertensive heart disease with heart failure: Secondary | ICD-10-CM | POA: Diagnosis not present

## 2022-10-19 DIAGNOSIS — Z7901 Long term (current) use of anticoagulants: Secondary | ICD-10-CM | POA: Diagnosis not present

## 2022-10-19 DIAGNOSIS — I4892 Unspecified atrial flutter: Secondary | ICD-10-CM | POA: Diagnosis not present

## 2022-10-19 DIAGNOSIS — Z7982 Long term (current) use of aspirin: Secondary | ICD-10-CM | POA: Diagnosis not present

## 2022-10-19 DIAGNOSIS — Z7984 Long term (current) use of oral hypoglycemic drugs: Secondary | ICD-10-CM | POA: Diagnosis not present

## 2022-10-19 DIAGNOSIS — I5033 Acute on chronic diastolic (congestive) heart failure: Secondary | ICD-10-CM | POA: Diagnosis not present

## 2022-10-19 DIAGNOSIS — I442 Atrioventricular block, complete: Secondary | ICD-10-CM | POA: Diagnosis not present

## 2022-10-19 DIAGNOSIS — I358 Other nonrheumatic aortic valve disorders: Secondary | ICD-10-CM | POA: Diagnosis not present

## 2022-10-19 DIAGNOSIS — I5022 Chronic systolic (congestive) heart failure: Secondary | ICD-10-CM | POA: Diagnosis not present

## 2022-10-19 DIAGNOSIS — F32A Depression, unspecified: Secondary | ICD-10-CM | POA: Diagnosis not present

## 2022-10-19 DIAGNOSIS — M1612 Unilateral primary osteoarthritis, left hip: Secondary | ICD-10-CM | POA: Diagnosis not present

## 2022-10-19 DIAGNOSIS — Z86711 Personal history of pulmonary embolism: Secondary | ICD-10-CM | POA: Diagnosis not present

## 2022-10-19 DIAGNOSIS — J449 Chronic obstructive pulmonary disease, unspecified: Secondary | ICD-10-CM | POA: Diagnosis not present

## 2022-10-19 DIAGNOSIS — F0283 Dementia in other diseases classified elsewhere, unspecified severity, with mood disturbance: Secondary | ICD-10-CM | POA: Diagnosis not present

## 2022-10-20 DIAGNOSIS — I5022 Chronic systolic (congestive) heart failure: Secondary | ICD-10-CM | POA: Diagnosis not present

## 2022-10-20 DIAGNOSIS — E1140 Type 2 diabetes mellitus with diabetic neuropathy, unspecified: Secondary | ICD-10-CM | POA: Diagnosis not present

## 2022-10-20 DIAGNOSIS — K219 Gastro-esophageal reflux disease without esophagitis: Secondary | ICD-10-CM | POA: Diagnosis not present

## 2022-10-20 DIAGNOSIS — I358 Other nonrheumatic aortic valve disorders: Secondary | ICD-10-CM | POA: Diagnosis not present

## 2022-10-20 DIAGNOSIS — I480 Paroxysmal atrial fibrillation: Secondary | ICD-10-CM | POA: Diagnosis not present

## 2022-10-20 DIAGNOSIS — F0284 Dementia in other diseases classified elsewhere, unspecified severity, with anxiety: Secondary | ICD-10-CM | POA: Diagnosis not present

## 2022-10-20 DIAGNOSIS — I5033 Acute on chronic diastolic (congestive) heart failure: Secondary | ICD-10-CM | POA: Diagnosis not present

## 2022-10-20 DIAGNOSIS — M1612 Unilateral primary osteoarthritis, left hip: Secondary | ICD-10-CM | POA: Diagnosis not present

## 2022-10-20 DIAGNOSIS — Z792 Long term (current) use of antibiotics: Secondary | ICD-10-CM | POA: Diagnosis not present

## 2022-10-20 DIAGNOSIS — Z7901 Long term (current) use of anticoagulants: Secondary | ICD-10-CM | POA: Diagnosis not present

## 2022-10-20 DIAGNOSIS — I4892 Unspecified atrial flutter: Secondary | ICD-10-CM | POA: Diagnosis not present

## 2022-10-20 DIAGNOSIS — J449 Chronic obstructive pulmonary disease, unspecified: Secondary | ICD-10-CM | POA: Diagnosis not present

## 2022-10-20 DIAGNOSIS — F32A Depression, unspecified: Secondary | ICD-10-CM | POA: Diagnosis not present

## 2022-10-20 DIAGNOSIS — Z7982 Long term (current) use of aspirin: Secondary | ICD-10-CM | POA: Diagnosis not present

## 2022-10-20 DIAGNOSIS — F0283 Dementia in other diseases classified elsewhere, unspecified severity, with mood disturbance: Secondary | ICD-10-CM | POA: Diagnosis not present

## 2022-10-20 DIAGNOSIS — Z8616 Personal history of COVID-19: Secondary | ICD-10-CM | POA: Diagnosis not present

## 2022-10-20 DIAGNOSIS — I442 Atrioventricular block, complete: Secondary | ICD-10-CM | POA: Diagnosis not present

## 2022-10-20 DIAGNOSIS — Z950 Presence of cardiac pacemaker: Secondary | ICD-10-CM | POA: Diagnosis not present

## 2022-10-20 DIAGNOSIS — Z7984 Long term (current) use of oral hypoglycemic drugs: Secondary | ICD-10-CM | POA: Diagnosis not present

## 2022-10-20 DIAGNOSIS — E039 Hypothyroidism, unspecified: Secondary | ICD-10-CM | POA: Diagnosis not present

## 2022-10-20 DIAGNOSIS — F02818 Dementia in other diseases classified elsewhere, unspecified severity, with other behavioral disturbance: Secondary | ICD-10-CM | POA: Diagnosis not present

## 2022-10-20 DIAGNOSIS — I110 Hypertensive heart disease with heart failure: Secondary | ICD-10-CM | POA: Diagnosis not present

## 2022-10-20 DIAGNOSIS — Z86711 Personal history of pulmonary embolism: Secondary | ICD-10-CM | POA: Diagnosis not present

## 2022-10-22 DIAGNOSIS — F0284 Dementia in other diseases classified elsewhere, unspecified severity, with anxiety: Secondary | ICD-10-CM | POA: Diagnosis not present

## 2022-10-22 DIAGNOSIS — F32A Depression, unspecified: Secondary | ICD-10-CM | POA: Diagnosis not present

## 2022-10-22 DIAGNOSIS — Z792 Long term (current) use of antibiotics: Secondary | ICD-10-CM | POA: Diagnosis not present

## 2022-10-22 DIAGNOSIS — J449 Chronic obstructive pulmonary disease, unspecified: Secondary | ICD-10-CM | POA: Diagnosis not present

## 2022-10-22 DIAGNOSIS — Z7984 Long term (current) use of oral hypoglycemic drugs: Secondary | ICD-10-CM | POA: Diagnosis not present

## 2022-10-22 DIAGNOSIS — Z7982 Long term (current) use of aspirin: Secondary | ICD-10-CM | POA: Diagnosis not present

## 2022-10-22 DIAGNOSIS — Z7901 Long term (current) use of anticoagulants: Secondary | ICD-10-CM | POA: Diagnosis not present

## 2022-10-22 DIAGNOSIS — I480 Paroxysmal atrial fibrillation: Secondary | ICD-10-CM | POA: Diagnosis not present

## 2022-10-22 DIAGNOSIS — M1612 Unilateral primary osteoarthritis, left hip: Secondary | ICD-10-CM | POA: Diagnosis not present

## 2022-10-22 DIAGNOSIS — Z950 Presence of cardiac pacemaker: Secondary | ICD-10-CM | POA: Diagnosis not present

## 2022-10-22 DIAGNOSIS — Z8616 Personal history of COVID-19: Secondary | ICD-10-CM | POA: Diagnosis not present

## 2022-10-22 DIAGNOSIS — K219 Gastro-esophageal reflux disease without esophagitis: Secondary | ICD-10-CM | POA: Diagnosis not present

## 2022-10-22 DIAGNOSIS — I110 Hypertensive heart disease with heart failure: Secondary | ICD-10-CM | POA: Diagnosis not present

## 2022-10-22 DIAGNOSIS — Z86711 Personal history of pulmonary embolism: Secondary | ICD-10-CM | POA: Diagnosis not present

## 2022-10-22 DIAGNOSIS — I442 Atrioventricular block, complete: Secondary | ICD-10-CM | POA: Diagnosis not present

## 2022-10-22 DIAGNOSIS — F0283 Dementia in other diseases classified elsewhere, unspecified severity, with mood disturbance: Secondary | ICD-10-CM | POA: Diagnosis not present

## 2022-10-22 DIAGNOSIS — I4892 Unspecified atrial flutter: Secondary | ICD-10-CM | POA: Diagnosis not present

## 2022-10-22 DIAGNOSIS — I5033 Acute on chronic diastolic (congestive) heart failure: Secondary | ICD-10-CM | POA: Diagnosis not present

## 2022-10-22 DIAGNOSIS — F02818 Dementia in other diseases classified elsewhere, unspecified severity, with other behavioral disturbance: Secondary | ICD-10-CM | POA: Diagnosis not present

## 2022-10-22 DIAGNOSIS — I358 Other nonrheumatic aortic valve disorders: Secondary | ICD-10-CM | POA: Diagnosis not present

## 2022-10-22 DIAGNOSIS — E039 Hypothyroidism, unspecified: Secondary | ICD-10-CM | POA: Diagnosis not present

## 2022-10-22 DIAGNOSIS — E1140 Type 2 diabetes mellitus with diabetic neuropathy, unspecified: Secondary | ICD-10-CM | POA: Diagnosis not present

## 2022-10-22 DIAGNOSIS — I5022 Chronic systolic (congestive) heart failure: Secondary | ICD-10-CM | POA: Diagnosis not present

## 2022-10-23 DIAGNOSIS — Z86711 Personal history of pulmonary embolism: Secondary | ICD-10-CM | POA: Diagnosis not present

## 2022-10-23 DIAGNOSIS — I4892 Unspecified atrial flutter: Secondary | ICD-10-CM | POA: Diagnosis not present

## 2022-10-23 DIAGNOSIS — Z7982 Long term (current) use of aspirin: Secondary | ICD-10-CM | POA: Diagnosis not present

## 2022-10-23 DIAGNOSIS — F0283 Dementia in other diseases classified elsewhere, unspecified severity, with mood disturbance: Secondary | ICD-10-CM | POA: Diagnosis not present

## 2022-10-23 DIAGNOSIS — E1140 Type 2 diabetes mellitus with diabetic neuropathy, unspecified: Secondary | ICD-10-CM | POA: Diagnosis not present

## 2022-10-23 DIAGNOSIS — F32A Depression, unspecified: Secondary | ICD-10-CM | POA: Diagnosis not present

## 2022-10-23 DIAGNOSIS — Z792 Long term (current) use of antibiotics: Secondary | ICD-10-CM | POA: Diagnosis not present

## 2022-10-23 DIAGNOSIS — E039 Hypothyroidism, unspecified: Secondary | ICD-10-CM | POA: Diagnosis not present

## 2022-10-23 DIAGNOSIS — I442 Atrioventricular block, complete: Secondary | ICD-10-CM | POA: Diagnosis not present

## 2022-10-23 DIAGNOSIS — M1612 Unilateral primary osteoarthritis, left hip: Secondary | ICD-10-CM | POA: Diagnosis not present

## 2022-10-23 DIAGNOSIS — Z7901 Long term (current) use of anticoagulants: Secondary | ICD-10-CM | POA: Diagnosis not present

## 2022-10-23 DIAGNOSIS — I5022 Chronic systolic (congestive) heart failure: Secondary | ICD-10-CM | POA: Diagnosis not present

## 2022-10-23 DIAGNOSIS — J449 Chronic obstructive pulmonary disease, unspecified: Secondary | ICD-10-CM | POA: Diagnosis not present

## 2022-10-23 DIAGNOSIS — Z8616 Personal history of COVID-19: Secondary | ICD-10-CM | POA: Diagnosis not present

## 2022-10-23 DIAGNOSIS — I110 Hypertensive heart disease with heart failure: Secondary | ICD-10-CM | POA: Diagnosis not present

## 2022-10-23 DIAGNOSIS — Z7984 Long term (current) use of oral hypoglycemic drugs: Secondary | ICD-10-CM | POA: Diagnosis not present

## 2022-10-23 DIAGNOSIS — F0284 Dementia in other diseases classified elsewhere, unspecified severity, with anxiety: Secondary | ICD-10-CM | POA: Diagnosis not present

## 2022-10-23 DIAGNOSIS — F02818 Dementia in other diseases classified elsewhere, unspecified severity, with other behavioral disturbance: Secondary | ICD-10-CM | POA: Diagnosis not present

## 2022-10-23 DIAGNOSIS — K219 Gastro-esophageal reflux disease without esophagitis: Secondary | ICD-10-CM | POA: Diagnosis not present

## 2022-10-23 DIAGNOSIS — I5033 Acute on chronic diastolic (congestive) heart failure: Secondary | ICD-10-CM | POA: Diagnosis not present

## 2022-10-23 DIAGNOSIS — I480 Paroxysmal atrial fibrillation: Secondary | ICD-10-CM | POA: Diagnosis not present

## 2022-10-23 DIAGNOSIS — Z950 Presence of cardiac pacemaker: Secondary | ICD-10-CM | POA: Diagnosis not present

## 2022-10-23 DIAGNOSIS — I358 Other nonrheumatic aortic valve disorders: Secondary | ICD-10-CM | POA: Diagnosis not present

## 2022-10-24 DIAGNOSIS — I110 Hypertensive heart disease with heart failure: Secondary | ICD-10-CM | POA: Diagnosis not present

## 2022-10-24 DIAGNOSIS — F0283 Dementia in other diseases classified elsewhere, unspecified severity, with mood disturbance: Secondary | ICD-10-CM | POA: Diagnosis not present

## 2022-10-24 DIAGNOSIS — I442 Atrioventricular block, complete: Secondary | ICD-10-CM | POA: Diagnosis not present

## 2022-10-24 DIAGNOSIS — F0284 Dementia in other diseases classified elsewhere, unspecified severity, with anxiety: Secondary | ICD-10-CM | POA: Diagnosis not present

## 2022-10-24 DIAGNOSIS — I358 Other nonrheumatic aortic valve disorders: Secondary | ICD-10-CM | POA: Diagnosis not present

## 2022-10-24 DIAGNOSIS — Z950 Presence of cardiac pacemaker: Secondary | ICD-10-CM | POA: Diagnosis not present

## 2022-10-24 DIAGNOSIS — K219 Gastro-esophageal reflux disease without esophagitis: Secondary | ICD-10-CM | POA: Diagnosis not present

## 2022-10-24 DIAGNOSIS — F32A Depression, unspecified: Secondary | ICD-10-CM | POA: Diagnosis not present

## 2022-10-24 DIAGNOSIS — Z7984 Long term (current) use of oral hypoglycemic drugs: Secondary | ICD-10-CM | POA: Diagnosis not present

## 2022-10-24 DIAGNOSIS — M1612 Unilateral primary osteoarthritis, left hip: Secondary | ICD-10-CM | POA: Diagnosis not present

## 2022-10-24 DIAGNOSIS — Z7901 Long term (current) use of anticoagulants: Secondary | ICD-10-CM | POA: Diagnosis not present

## 2022-10-24 DIAGNOSIS — E1140 Type 2 diabetes mellitus with diabetic neuropathy, unspecified: Secondary | ICD-10-CM | POA: Diagnosis not present

## 2022-10-24 DIAGNOSIS — Z792 Long term (current) use of antibiotics: Secondary | ICD-10-CM | POA: Diagnosis not present

## 2022-10-24 DIAGNOSIS — J449 Chronic obstructive pulmonary disease, unspecified: Secondary | ICD-10-CM | POA: Diagnosis not present

## 2022-10-24 DIAGNOSIS — Z7982 Long term (current) use of aspirin: Secondary | ICD-10-CM | POA: Diagnosis not present

## 2022-10-24 DIAGNOSIS — F02818 Dementia in other diseases classified elsewhere, unspecified severity, with other behavioral disturbance: Secondary | ICD-10-CM | POA: Diagnosis not present

## 2022-10-24 DIAGNOSIS — Z8616 Personal history of COVID-19: Secondary | ICD-10-CM | POA: Diagnosis not present

## 2022-10-24 DIAGNOSIS — I5033 Acute on chronic diastolic (congestive) heart failure: Secondary | ICD-10-CM | POA: Diagnosis not present

## 2022-10-24 DIAGNOSIS — I480 Paroxysmal atrial fibrillation: Secondary | ICD-10-CM | POA: Diagnosis not present

## 2022-10-24 DIAGNOSIS — I5022 Chronic systolic (congestive) heart failure: Secondary | ICD-10-CM | POA: Diagnosis not present

## 2022-10-24 DIAGNOSIS — I4892 Unspecified atrial flutter: Secondary | ICD-10-CM | POA: Diagnosis not present

## 2022-10-24 DIAGNOSIS — E039 Hypothyroidism, unspecified: Secondary | ICD-10-CM | POA: Diagnosis not present

## 2022-10-24 DIAGNOSIS — Z86711 Personal history of pulmonary embolism: Secondary | ICD-10-CM | POA: Diagnosis not present

## 2022-10-25 ENCOUNTER — Other Ambulatory Visit: Payer: Self-pay

## 2022-10-25 ENCOUNTER — Telehealth: Payer: Self-pay | Admitting: Internal Medicine

## 2022-10-25 ENCOUNTER — Other Ambulatory Visit: Payer: Self-pay | Admitting: Internal Medicine

## 2022-10-25 DIAGNOSIS — I4892 Unspecified atrial flutter: Secondary | ICD-10-CM | POA: Diagnosis not present

## 2022-10-25 DIAGNOSIS — F0284 Dementia in other diseases classified elsewhere, unspecified severity, with anxiety: Secondary | ICD-10-CM | POA: Diagnosis not present

## 2022-10-25 DIAGNOSIS — K219 Gastro-esophageal reflux disease without esophagitis: Secondary | ICD-10-CM | POA: Diagnosis not present

## 2022-10-25 DIAGNOSIS — Z7901 Long term (current) use of anticoagulants: Secondary | ICD-10-CM | POA: Diagnosis not present

## 2022-10-25 DIAGNOSIS — Z86711 Personal history of pulmonary embolism: Secondary | ICD-10-CM | POA: Diagnosis not present

## 2022-10-25 DIAGNOSIS — F0283 Dementia in other diseases classified elsewhere, unspecified severity, with mood disturbance: Secondary | ICD-10-CM | POA: Diagnosis not present

## 2022-10-25 DIAGNOSIS — F32A Depression, unspecified: Secondary | ICD-10-CM | POA: Diagnosis not present

## 2022-10-25 DIAGNOSIS — I5033 Acute on chronic diastolic (congestive) heart failure: Secondary | ICD-10-CM | POA: Diagnosis not present

## 2022-10-25 DIAGNOSIS — Z792 Long term (current) use of antibiotics: Secondary | ICD-10-CM | POA: Diagnosis not present

## 2022-10-25 DIAGNOSIS — J449 Chronic obstructive pulmonary disease, unspecified: Secondary | ICD-10-CM | POA: Diagnosis not present

## 2022-10-25 DIAGNOSIS — Z7984 Long term (current) use of oral hypoglycemic drugs: Secondary | ICD-10-CM | POA: Diagnosis not present

## 2022-10-25 DIAGNOSIS — I442 Atrioventricular block, complete: Secondary | ICD-10-CM | POA: Diagnosis not present

## 2022-10-25 DIAGNOSIS — Z950 Presence of cardiac pacemaker: Secondary | ICD-10-CM | POA: Diagnosis not present

## 2022-10-25 DIAGNOSIS — M1612 Unilateral primary osteoarthritis, left hip: Secondary | ICD-10-CM | POA: Diagnosis not present

## 2022-10-25 DIAGNOSIS — E1140 Type 2 diabetes mellitus with diabetic neuropathy, unspecified: Secondary | ICD-10-CM | POA: Diagnosis not present

## 2022-10-25 DIAGNOSIS — I480 Paroxysmal atrial fibrillation: Secondary | ICD-10-CM | POA: Diagnosis not present

## 2022-10-25 DIAGNOSIS — Z8616 Personal history of COVID-19: Secondary | ICD-10-CM | POA: Diagnosis not present

## 2022-10-25 DIAGNOSIS — F02818 Dementia in other diseases classified elsewhere, unspecified severity, with other behavioral disturbance: Secondary | ICD-10-CM | POA: Diagnosis not present

## 2022-10-25 DIAGNOSIS — Z7982 Long term (current) use of aspirin: Secondary | ICD-10-CM | POA: Diagnosis not present

## 2022-10-25 DIAGNOSIS — I358 Other nonrheumatic aortic valve disorders: Secondary | ICD-10-CM | POA: Diagnosis not present

## 2022-10-25 DIAGNOSIS — I5022 Chronic systolic (congestive) heart failure: Secondary | ICD-10-CM | POA: Diagnosis not present

## 2022-10-25 DIAGNOSIS — I110 Hypertensive heart disease with heart failure: Secondary | ICD-10-CM | POA: Diagnosis not present

## 2022-10-25 DIAGNOSIS — E039 Hypothyroidism, unspecified: Secondary | ICD-10-CM | POA: Diagnosis not present

## 2022-10-25 MED ORDER — CEFADROXIL 500 MG PO CAPS: 500.0000 mg | ORAL_CAPSULE | Freq: Two times a day (BID) | ORAL | 3 refills | Status: DC

## 2022-10-25 NOTE — Telephone Encounter (Signed)
Maralyn Sago called from [facility].  Pt felled on Monday but no injuries nurse stated pt felled last week as well. The nurse wanting to do a urinalysis culture on pt to see if pt do not have no UTI that is causing pt balance to be off. Please advise  Best call back is [phone]

## 2022-10-26 DIAGNOSIS — Z950 Presence of cardiac pacemaker: Secondary | ICD-10-CM | POA: Diagnosis not present

## 2022-10-26 DIAGNOSIS — Z7982 Long term (current) use of aspirin: Secondary | ICD-10-CM | POA: Diagnosis not present

## 2022-10-26 DIAGNOSIS — I110 Hypertensive heart disease with heart failure: Secondary | ICD-10-CM | POA: Diagnosis not present

## 2022-10-26 DIAGNOSIS — I5033 Acute on chronic diastolic (congestive) heart failure: Secondary | ICD-10-CM | POA: Diagnosis not present

## 2022-10-26 DIAGNOSIS — J449 Chronic obstructive pulmonary disease, unspecified: Secondary | ICD-10-CM | POA: Diagnosis not present

## 2022-10-26 DIAGNOSIS — F0284 Dementia in other diseases classified elsewhere, unspecified severity, with anxiety: Secondary | ICD-10-CM | POA: Diagnosis not present

## 2022-10-26 DIAGNOSIS — E1140 Type 2 diabetes mellitus with diabetic neuropathy, unspecified: Secondary | ICD-10-CM | POA: Diagnosis not present

## 2022-10-26 DIAGNOSIS — Z8616 Personal history of COVID-19: Secondary | ICD-10-CM | POA: Diagnosis not present

## 2022-10-26 DIAGNOSIS — M1612 Unilateral primary osteoarthritis, left hip: Secondary | ICD-10-CM | POA: Diagnosis not present

## 2022-10-26 DIAGNOSIS — K219 Gastro-esophageal reflux disease without esophagitis: Secondary | ICD-10-CM | POA: Diagnosis not present

## 2022-10-26 DIAGNOSIS — R350 Frequency of micturition: Secondary | ICD-10-CM | POA: Diagnosis not present

## 2022-10-26 DIAGNOSIS — E039 Hypothyroidism, unspecified: Secondary | ICD-10-CM | POA: Diagnosis not present

## 2022-10-26 DIAGNOSIS — Z86711 Personal history of pulmonary embolism: Secondary | ICD-10-CM | POA: Diagnosis not present

## 2022-10-26 DIAGNOSIS — F0283 Dementia in other diseases classified elsewhere, unspecified severity, with mood disturbance: Secondary | ICD-10-CM | POA: Diagnosis not present

## 2022-10-26 DIAGNOSIS — I4892 Unspecified atrial flutter: Secondary | ICD-10-CM | POA: Diagnosis not present

## 2022-10-26 DIAGNOSIS — Z792 Long term (current) use of antibiotics: Secondary | ICD-10-CM | POA: Diagnosis not present

## 2022-10-26 DIAGNOSIS — Z7901 Long term (current) use of anticoagulants: Secondary | ICD-10-CM | POA: Diagnosis not present

## 2022-10-26 DIAGNOSIS — I442 Atrioventricular block, complete: Secondary | ICD-10-CM | POA: Diagnosis not present

## 2022-10-26 DIAGNOSIS — I5022 Chronic systolic (congestive) heart failure: Secondary | ICD-10-CM | POA: Diagnosis not present

## 2022-10-26 DIAGNOSIS — I480 Paroxysmal atrial fibrillation: Secondary | ICD-10-CM | POA: Diagnosis not present

## 2022-10-26 DIAGNOSIS — F32A Depression, unspecified: Secondary | ICD-10-CM | POA: Diagnosis not present

## 2022-10-26 DIAGNOSIS — Z7984 Long term (current) use of oral hypoglycemic drugs: Secondary | ICD-10-CM | POA: Diagnosis not present

## 2022-10-26 DIAGNOSIS — F02818 Dementia in other diseases classified elsewhere, unspecified severity, with other behavioral disturbance: Secondary | ICD-10-CM | POA: Diagnosis not present

## 2022-10-26 DIAGNOSIS — I358 Other nonrheumatic aortic valve disorders: Secondary | ICD-10-CM | POA: Diagnosis not present

## 2022-10-26 NOTE — Telephone Encounter (Signed)
Called Jessica [name] no answer LMOM w/MD response.Marland KitchenRaechel Chute

## 2022-10-26 NOTE — Telephone Encounter (Signed)
Okay.  Try to do a clean-catch.  Thanks

## 2022-10-27 ENCOUNTER — Telehealth: Payer: Self-pay | Admitting: *Deleted

## 2022-10-27 NOTE — Telephone Encounter (Signed)
   Telephone encounter was:  Successful.  10/27/2022 Name: [name] MRN: [id number] DOB: 1933/08/24  Jessica [name] is a 89 y.o. year old female who is a primary care patient of Plotnikov, Georgina Quint, MD . The community resource team was consulted for assistance with Food Insecurity Called to return a message' for patient running out of moms meals membership turned in renew request  Care guide performed the following interventions: Follow up call placed to the patient to discuss status of referral.  Follow Up Plan:  No further follow up planned at this time. The patient has been provided with needed resources.  Yehuda Mao Greenauer -[facility] , Population Health [phone] [address] [place] , [place] [address] Email : [email or web address]

## 2022-10-30 ENCOUNTER — Encounter: Payer: Self-pay | Admitting: Internal Medicine

## 2022-10-31 DIAGNOSIS — L89891 Pressure ulcer of other site, stage 1: Secondary | ICD-10-CM | POA: Diagnosis not present

## 2022-10-31 DIAGNOSIS — F0283 Dementia in other diseases classified elsewhere, unspecified severity, with mood disturbance: Secondary | ICD-10-CM | POA: Diagnosis not present

## 2022-10-31 DIAGNOSIS — I5022 Chronic systolic (congestive) heart failure: Secondary | ICD-10-CM | POA: Diagnosis not present

## 2022-10-31 DIAGNOSIS — I5033 Acute on chronic diastolic (congestive) heart failure: Secondary | ICD-10-CM | POA: Diagnosis not present

## 2022-10-31 DIAGNOSIS — I4892 Unspecified atrial flutter: Secondary | ICD-10-CM | POA: Diagnosis not present

## 2022-10-31 DIAGNOSIS — Z86711 Personal history of pulmonary embolism: Secondary | ICD-10-CM | POA: Diagnosis not present

## 2022-10-31 DIAGNOSIS — I442 Atrioventricular block, complete: Secondary | ICD-10-CM | POA: Diagnosis not present

## 2022-10-31 DIAGNOSIS — F0284 Dementia in other diseases classified elsewhere, unspecified severity, with anxiety: Secondary | ICD-10-CM | POA: Diagnosis not present

## 2022-10-31 DIAGNOSIS — K219 Gastro-esophageal reflux disease without esophagitis: Secondary | ICD-10-CM | POA: Diagnosis not present

## 2022-10-31 DIAGNOSIS — E039 Hypothyroidism, unspecified: Secondary | ICD-10-CM | POA: Diagnosis not present

## 2022-10-31 DIAGNOSIS — F32A Depression, unspecified: Secondary | ICD-10-CM | POA: Diagnosis not present

## 2022-10-31 DIAGNOSIS — I358 Other nonrheumatic aortic valve disorders: Secondary | ICD-10-CM | POA: Diagnosis not present

## 2022-10-31 DIAGNOSIS — I110 Hypertensive heart disease with heart failure: Secondary | ICD-10-CM | POA: Diagnosis not present

## 2022-10-31 DIAGNOSIS — Z8616 Personal history of COVID-19: Secondary | ICD-10-CM | POA: Diagnosis not present

## 2022-10-31 DIAGNOSIS — Z7982 Long term (current) use of aspirin: Secondary | ICD-10-CM | POA: Diagnosis not present

## 2022-10-31 DIAGNOSIS — M1612 Unilateral primary osteoarthritis, left hip: Secondary | ICD-10-CM | POA: Diagnosis not present

## 2022-10-31 DIAGNOSIS — F02818 Dementia in other diseases classified elsewhere, unspecified severity, with other behavioral disturbance: Secondary | ICD-10-CM | POA: Diagnosis not present

## 2022-10-31 DIAGNOSIS — E1140 Type 2 diabetes mellitus with diabetic neuropathy, unspecified: Secondary | ICD-10-CM | POA: Diagnosis not present

## 2022-10-31 DIAGNOSIS — J449 Chronic obstructive pulmonary disease, unspecified: Secondary | ICD-10-CM | POA: Diagnosis not present

## 2022-10-31 DIAGNOSIS — I480 Paroxysmal atrial fibrillation: Secondary | ICD-10-CM | POA: Diagnosis not present

## 2022-10-31 DIAGNOSIS — Z9181 History of falling: Secondary | ICD-10-CM | POA: Diagnosis not present

## 2022-10-31 DIAGNOSIS — Z7984 Long term (current) use of oral hypoglycemic drugs: Secondary | ICD-10-CM | POA: Diagnosis not present

## 2022-10-31 DIAGNOSIS — Z792 Long term (current) use of antibiotics: Secondary | ICD-10-CM | POA: Diagnosis not present

## 2022-11-02 DIAGNOSIS — Z9181 History of falling: Secondary | ICD-10-CM | POA: Diagnosis not present

## 2022-11-02 DIAGNOSIS — F0284 Dementia in other diseases classified elsewhere, unspecified severity, with anxiety: Secondary | ICD-10-CM | POA: Diagnosis not present

## 2022-11-02 DIAGNOSIS — I358 Other nonrheumatic aortic valve disorders: Secondary | ICD-10-CM | POA: Diagnosis not present

## 2022-11-02 DIAGNOSIS — I442 Atrioventricular block, complete: Secondary | ICD-10-CM | POA: Diagnosis not present

## 2022-11-02 DIAGNOSIS — E039 Hypothyroidism, unspecified: Secondary | ICD-10-CM | POA: Diagnosis not present

## 2022-11-02 DIAGNOSIS — E1140 Type 2 diabetes mellitus with diabetic neuropathy, unspecified: Secondary | ICD-10-CM | POA: Diagnosis not present

## 2022-11-02 DIAGNOSIS — R8290 Unspecified abnormal findings in urine: Secondary | ICD-10-CM | POA: Diagnosis not present

## 2022-11-02 DIAGNOSIS — Z86711 Personal history of pulmonary embolism: Secondary | ICD-10-CM | POA: Diagnosis not present

## 2022-11-02 DIAGNOSIS — Z7984 Long term (current) use of oral hypoglycemic drugs: Secondary | ICD-10-CM | POA: Diagnosis not present

## 2022-11-02 DIAGNOSIS — K219 Gastro-esophageal reflux disease without esophagitis: Secondary | ICD-10-CM | POA: Diagnosis not present

## 2022-11-02 DIAGNOSIS — F32A Depression, unspecified: Secondary | ICD-10-CM | POA: Diagnosis not present

## 2022-11-02 DIAGNOSIS — Z792 Long term (current) use of antibiotics: Secondary | ICD-10-CM | POA: Diagnosis not present

## 2022-11-02 DIAGNOSIS — F02818 Dementia in other diseases classified elsewhere, unspecified severity, with other behavioral disturbance: Secondary | ICD-10-CM | POA: Diagnosis not present

## 2022-11-02 DIAGNOSIS — I110 Hypertensive heart disease with heart failure: Secondary | ICD-10-CM | POA: Diagnosis not present

## 2022-11-02 DIAGNOSIS — Z7982 Long term (current) use of aspirin: Secondary | ICD-10-CM | POA: Diagnosis not present

## 2022-11-02 DIAGNOSIS — L89891 Pressure ulcer of other site, stage 1: Secondary | ICD-10-CM | POA: Diagnosis not present

## 2022-11-02 DIAGNOSIS — Z87440 Personal history of urinary (tract) infections: Secondary | ICD-10-CM | POA: Diagnosis not present

## 2022-11-02 DIAGNOSIS — I4892 Unspecified atrial flutter: Secondary | ICD-10-CM | POA: Diagnosis not present

## 2022-11-02 DIAGNOSIS — I480 Paroxysmal atrial fibrillation: Secondary | ICD-10-CM | POA: Diagnosis not present

## 2022-11-02 DIAGNOSIS — Z8616 Personal history of COVID-19: Secondary | ICD-10-CM | POA: Diagnosis not present

## 2022-11-02 DIAGNOSIS — J449 Chronic obstructive pulmonary disease, unspecified: Secondary | ICD-10-CM | POA: Diagnosis not present

## 2022-11-02 DIAGNOSIS — I5022 Chronic systolic (congestive) heart failure: Secondary | ICD-10-CM | POA: Diagnosis not present

## 2022-11-02 DIAGNOSIS — F0283 Dementia in other diseases classified elsewhere, unspecified severity, with mood disturbance: Secondary | ICD-10-CM | POA: Diagnosis not present

## 2022-11-02 DIAGNOSIS — M1612 Unilateral primary osteoarthritis, left hip: Secondary | ICD-10-CM | POA: Diagnosis not present

## 2022-11-02 DIAGNOSIS — I5033 Acute on chronic diastolic (congestive) heart failure: Secondary | ICD-10-CM | POA: Diagnosis not present

## 2022-11-02 NOTE — Telephone Encounter (Signed)
Maralyn Sago called back and they will try for a clean catch urinalysis

## 2022-11-03 ENCOUNTER — Other Ambulatory Visit: Payer: Self-pay | Admitting: Nurse Practitioner

## 2022-11-03 DIAGNOSIS — I5022 Chronic systolic (congestive) heart failure: Secondary | ICD-10-CM | POA: Diagnosis not present

## 2022-11-03 DIAGNOSIS — F0284 Dementia in other diseases classified elsewhere, unspecified severity, with anxiety: Secondary | ICD-10-CM | POA: Diagnosis not present

## 2022-11-03 DIAGNOSIS — I4892 Unspecified atrial flutter: Secondary | ICD-10-CM | POA: Diagnosis not present

## 2022-11-03 DIAGNOSIS — Z9181 History of falling: Secondary | ICD-10-CM | POA: Diagnosis not present

## 2022-11-03 DIAGNOSIS — Z792 Long term (current) use of antibiotics: Secondary | ICD-10-CM | POA: Diagnosis not present

## 2022-11-03 DIAGNOSIS — I480 Paroxysmal atrial fibrillation: Secondary | ICD-10-CM | POA: Diagnosis not present

## 2022-11-03 DIAGNOSIS — K219 Gastro-esophageal reflux disease without esophagitis: Secondary | ICD-10-CM | POA: Diagnosis not present

## 2022-11-03 DIAGNOSIS — Z7982 Long term (current) use of aspirin: Secondary | ICD-10-CM | POA: Diagnosis not present

## 2022-11-03 DIAGNOSIS — W19XXXD Unspecified fall, subsequent encounter: Secondary | ICD-10-CM

## 2022-11-03 DIAGNOSIS — E1140 Type 2 diabetes mellitus with diabetic neuropathy, unspecified: Secondary | ICD-10-CM | POA: Diagnosis not present

## 2022-11-03 DIAGNOSIS — Z7984 Long term (current) use of oral hypoglycemic drugs: Secondary | ICD-10-CM | POA: Diagnosis not present

## 2022-11-03 DIAGNOSIS — I110 Hypertensive heart disease with heart failure: Secondary | ICD-10-CM | POA: Diagnosis not present

## 2022-11-03 DIAGNOSIS — Z8616 Personal history of COVID-19: Secondary | ICD-10-CM | POA: Diagnosis not present

## 2022-11-03 DIAGNOSIS — M1612 Unilateral primary osteoarthritis, left hip: Secondary | ICD-10-CM | POA: Diagnosis not present

## 2022-11-03 DIAGNOSIS — F32A Depression, unspecified: Secondary | ICD-10-CM | POA: Diagnosis not present

## 2022-11-03 DIAGNOSIS — Z86711 Personal history of pulmonary embolism: Secondary | ICD-10-CM | POA: Diagnosis not present

## 2022-11-03 DIAGNOSIS — I442 Atrioventricular block, complete: Secondary | ICD-10-CM | POA: Diagnosis not present

## 2022-11-03 DIAGNOSIS — I5033 Acute on chronic diastolic (congestive) heart failure: Secondary | ICD-10-CM | POA: Diagnosis not present

## 2022-11-03 DIAGNOSIS — E039 Hypothyroidism, unspecified: Secondary | ICD-10-CM | POA: Diagnosis not present

## 2022-11-03 DIAGNOSIS — F0283 Dementia in other diseases classified elsewhere, unspecified severity, with mood disturbance: Secondary | ICD-10-CM | POA: Diagnosis not present

## 2022-11-03 DIAGNOSIS — J449 Chronic obstructive pulmonary disease, unspecified: Secondary | ICD-10-CM | POA: Diagnosis not present

## 2022-11-03 DIAGNOSIS — F02818 Dementia in other diseases classified elsewhere, unspecified severity, with other behavioral disturbance: Secondary | ICD-10-CM | POA: Diagnosis not present

## 2022-11-03 DIAGNOSIS — L89891 Pressure ulcer of other site, stage 1: Secondary | ICD-10-CM | POA: Diagnosis not present

## 2022-11-03 DIAGNOSIS — I358 Other nonrheumatic aortic valve disorders: Secondary | ICD-10-CM | POA: Diagnosis not present

## 2022-11-03 NOTE — Telephone Encounter (Signed)
Sending msg to DOD for advisement. Order has been downloaded in sxs.Marland KitchenRaechel Chute

## 2022-11-03 NOTE — Telephone Encounter (Signed)
Urine analysis came back with white blood cells, glucose, yeast, bacteria

## 2022-11-06 ENCOUNTER — Telehealth: Payer: Self-pay | Admitting: Internal Medicine

## 2022-11-06 DIAGNOSIS — I5033 Acute on chronic diastolic (congestive) heart failure: Secondary | ICD-10-CM | POA: Diagnosis not present

## 2022-11-06 DIAGNOSIS — Z792 Long term (current) use of antibiotics: Secondary | ICD-10-CM | POA: Diagnosis not present

## 2022-11-06 DIAGNOSIS — L89891 Pressure ulcer of other site, stage 1: Secondary | ICD-10-CM | POA: Diagnosis not present

## 2022-11-06 DIAGNOSIS — I442 Atrioventricular block, complete: Secondary | ICD-10-CM | POA: Diagnosis not present

## 2022-11-06 DIAGNOSIS — K219 Gastro-esophageal reflux disease without esophagitis: Secondary | ICD-10-CM | POA: Diagnosis not present

## 2022-11-06 DIAGNOSIS — I4892 Unspecified atrial flutter: Secondary | ICD-10-CM | POA: Diagnosis not present

## 2022-11-06 DIAGNOSIS — E039 Hypothyroidism, unspecified: Secondary | ICD-10-CM | POA: Diagnosis not present

## 2022-11-06 DIAGNOSIS — Z86711 Personal history of pulmonary embolism: Secondary | ICD-10-CM | POA: Diagnosis not present

## 2022-11-06 DIAGNOSIS — F02818 Dementia in other diseases classified elsewhere, unspecified severity, with other behavioral disturbance: Secondary | ICD-10-CM | POA: Diagnosis not present

## 2022-11-06 DIAGNOSIS — Z9181 History of falling: Secondary | ICD-10-CM | POA: Diagnosis not present

## 2022-11-06 DIAGNOSIS — Z7982 Long term (current) use of aspirin: Secondary | ICD-10-CM | POA: Diagnosis not present

## 2022-11-06 DIAGNOSIS — I480 Paroxysmal atrial fibrillation: Secondary | ICD-10-CM | POA: Diagnosis not present

## 2022-11-06 DIAGNOSIS — J449 Chronic obstructive pulmonary disease, unspecified: Secondary | ICD-10-CM | POA: Diagnosis not present

## 2022-11-06 DIAGNOSIS — I110 Hypertensive heart disease with heart failure: Secondary | ICD-10-CM | POA: Diagnosis not present

## 2022-11-06 DIAGNOSIS — F32A Depression, unspecified: Secondary | ICD-10-CM | POA: Diagnosis not present

## 2022-11-06 DIAGNOSIS — Z8616 Personal history of COVID-19: Secondary | ICD-10-CM | POA: Diagnosis not present

## 2022-11-06 DIAGNOSIS — F0283 Dementia in other diseases classified elsewhere, unspecified severity, with mood disturbance: Secondary | ICD-10-CM | POA: Diagnosis not present

## 2022-11-06 DIAGNOSIS — E1140 Type 2 diabetes mellitus with diabetic neuropathy, unspecified: Secondary | ICD-10-CM | POA: Diagnosis not present

## 2022-11-06 DIAGNOSIS — I5022 Chronic systolic (congestive) heart failure: Secondary | ICD-10-CM | POA: Diagnosis not present

## 2022-11-06 DIAGNOSIS — M1612 Unilateral primary osteoarthritis, left hip: Secondary | ICD-10-CM | POA: Diagnosis not present

## 2022-11-06 DIAGNOSIS — F0284 Dementia in other diseases classified elsewhere, unspecified severity, with anxiety: Secondary | ICD-10-CM | POA: Diagnosis not present

## 2022-11-06 DIAGNOSIS — I358 Other nonrheumatic aortic valve disorders: Secondary | ICD-10-CM | POA: Diagnosis not present

## 2022-11-06 DIAGNOSIS — Z7984 Long term (current) use of oral hypoglycemic drugs: Secondary | ICD-10-CM | POA: Diagnosis not present

## 2022-11-06 MED ORDER — NITROFURANTOIN MONOHYD MACRO 100 MG PO CAPS: 100.0000 mg | ORAL_CAPSULE | Freq: Two times a day (BID) | ORAL | 0 refills | Status: DC

## 2022-11-06 NOTE — Telephone Encounter (Signed)
Called Sarah at [facility]. Binnie Rail, NP response. She states she will try again to get urine. [employer] is giving them a hard time with temp wise .Marland KitchenShearon Stalls

## 2022-11-06 NOTE — Telephone Encounter (Signed)
Called pt inform rx was sent this morning. Resent again to [facility].Marland KitchenRaechel Chute

## 2022-11-06 NOTE — Telephone Encounter (Signed)
Patient called and said she spoke with her pharmacy and they told her they have not received nitrofurantoin, macrocrystal-monohydrate, (MACROBID) 100 MG capsule. She would like to know if it can be re-sent to them. Best callback is [phone].

## 2022-11-07 DIAGNOSIS — E039 Hypothyroidism, unspecified: Secondary | ICD-10-CM | POA: Diagnosis not present

## 2022-11-07 DIAGNOSIS — J449 Chronic obstructive pulmonary disease, unspecified: Secondary | ICD-10-CM | POA: Diagnosis not present

## 2022-11-07 DIAGNOSIS — K219 Gastro-esophageal reflux disease without esophagitis: Secondary | ICD-10-CM | POA: Diagnosis not present

## 2022-11-07 DIAGNOSIS — L89891 Pressure ulcer of other site, stage 1: Secondary | ICD-10-CM | POA: Diagnosis not present

## 2022-11-07 DIAGNOSIS — I442 Atrioventricular block, complete: Secondary | ICD-10-CM | POA: Diagnosis not present

## 2022-11-07 DIAGNOSIS — F02818 Dementia in other diseases classified elsewhere, unspecified severity, with other behavioral disturbance: Secondary | ICD-10-CM | POA: Diagnosis not present

## 2022-11-07 DIAGNOSIS — E1140 Type 2 diabetes mellitus with diabetic neuropathy, unspecified: Secondary | ICD-10-CM | POA: Diagnosis not present

## 2022-11-07 DIAGNOSIS — N390 Urinary tract infection, site not specified: Secondary | ICD-10-CM | POA: Diagnosis not present

## 2022-11-07 DIAGNOSIS — I5033 Acute on chronic diastolic (congestive) heart failure: Secondary | ICD-10-CM | POA: Diagnosis not present

## 2022-11-07 DIAGNOSIS — I5022 Chronic systolic (congestive) heart failure: Secondary | ICD-10-CM | POA: Diagnosis not present

## 2022-11-07 DIAGNOSIS — I4892 Unspecified atrial flutter: Secondary | ICD-10-CM | POA: Diagnosis not present

## 2022-11-07 DIAGNOSIS — F0284 Dementia in other diseases classified elsewhere, unspecified severity, with anxiety: Secondary | ICD-10-CM | POA: Diagnosis not present

## 2022-11-07 DIAGNOSIS — Z9181 History of falling: Secondary | ICD-10-CM | POA: Diagnosis not present

## 2022-11-07 DIAGNOSIS — F32A Depression, unspecified: Secondary | ICD-10-CM | POA: Diagnosis not present

## 2022-11-07 DIAGNOSIS — I480 Paroxysmal atrial fibrillation: Secondary | ICD-10-CM | POA: Diagnosis not present

## 2022-11-07 DIAGNOSIS — Z8616 Personal history of COVID-19: Secondary | ICD-10-CM | POA: Diagnosis not present

## 2022-11-07 DIAGNOSIS — I110 Hypertensive heart disease with heart failure: Secondary | ICD-10-CM | POA: Diagnosis not present

## 2022-11-07 DIAGNOSIS — Z86711 Personal history of pulmonary embolism: Secondary | ICD-10-CM | POA: Diagnosis not present

## 2022-11-07 DIAGNOSIS — I358 Other nonrheumatic aortic valve disorders: Secondary | ICD-10-CM | POA: Diagnosis not present

## 2022-11-07 DIAGNOSIS — F0283 Dementia in other diseases classified elsewhere, unspecified severity, with mood disturbance: Secondary | ICD-10-CM | POA: Diagnosis not present

## 2022-11-07 DIAGNOSIS — Z7984 Long term (current) use of oral hypoglycemic drugs: Secondary | ICD-10-CM | POA: Diagnosis not present

## 2022-11-07 DIAGNOSIS — M1612 Unilateral primary osteoarthritis, left hip: Secondary | ICD-10-CM | POA: Diagnosis not present

## 2022-11-07 DIAGNOSIS — Z792 Long term (current) use of antibiotics: Secondary | ICD-10-CM | POA: Diagnosis not present

## 2022-11-07 DIAGNOSIS — Z7982 Long term (current) use of aspirin: Secondary | ICD-10-CM | POA: Diagnosis not present

## 2022-11-07 NOTE — Telephone Encounter (Signed)
Noted! Thank you

## 2022-11-09 DIAGNOSIS — K219 Gastro-esophageal reflux disease without esophagitis: Secondary | ICD-10-CM | POA: Diagnosis not present

## 2022-11-09 DIAGNOSIS — I480 Paroxysmal atrial fibrillation: Secondary | ICD-10-CM | POA: Diagnosis not present

## 2022-11-09 DIAGNOSIS — M1612 Unilateral primary osteoarthritis, left hip: Secondary | ICD-10-CM | POA: Diagnosis not present

## 2022-11-09 DIAGNOSIS — I4892 Unspecified atrial flutter: Secondary | ICD-10-CM | POA: Diagnosis not present

## 2022-11-09 DIAGNOSIS — F0284 Dementia in other diseases classified elsewhere, unspecified severity, with anxiety: Secondary | ICD-10-CM | POA: Diagnosis not present

## 2022-11-09 DIAGNOSIS — Z86711 Personal history of pulmonary embolism: Secondary | ICD-10-CM | POA: Diagnosis not present

## 2022-11-09 DIAGNOSIS — J449 Chronic obstructive pulmonary disease, unspecified: Secondary | ICD-10-CM | POA: Diagnosis not present

## 2022-11-09 DIAGNOSIS — Z8616 Personal history of COVID-19: Secondary | ICD-10-CM | POA: Diagnosis not present

## 2022-11-09 DIAGNOSIS — I5022 Chronic systolic (congestive) heart failure: Secondary | ICD-10-CM | POA: Diagnosis not present

## 2022-11-09 DIAGNOSIS — I442 Atrioventricular block, complete: Secondary | ICD-10-CM | POA: Diagnosis not present

## 2022-11-09 DIAGNOSIS — Z7982 Long term (current) use of aspirin: Secondary | ICD-10-CM | POA: Diagnosis not present

## 2022-11-09 DIAGNOSIS — F02818 Dementia in other diseases classified elsewhere, unspecified severity, with other behavioral disturbance: Secondary | ICD-10-CM | POA: Diagnosis not present

## 2022-11-09 DIAGNOSIS — I110 Hypertensive heart disease with heart failure: Secondary | ICD-10-CM | POA: Diagnosis not present

## 2022-11-09 DIAGNOSIS — E1140 Type 2 diabetes mellitus with diabetic neuropathy, unspecified: Secondary | ICD-10-CM | POA: Diagnosis not present

## 2022-11-09 DIAGNOSIS — I358 Other nonrheumatic aortic valve disorders: Secondary | ICD-10-CM | POA: Diagnosis not present

## 2022-11-09 DIAGNOSIS — L89891 Pressure ulcer of other site, stage 1: Secondary | ICD-10-CM | POA: Diagnosis not present

## 2022-11-09 DIAGNOSIS — Z7984 Long term (current) use of oral hypoglycemic drugs: Secondary | ICD-10-CM | POA: Diagnosis not present

## 2022-11-09 DIAGNOSIS — E039 Hypothyroidism, unspecified: Secondary | ICD-10-CM | POA: Diagnosis not present

## 2022-11-09 DIAGNOSIS — Z9181 History of falling: Secondary | ICD-10-CM | POA: Diagnosis not present

## 2022-11-09 DIAGNOSIS — F32A Depression, unspecified: Secondary | ICD-10-CM | POA: Diagnosis not present

## 2022-11-09 DIAGNOSIS — F0283 Dementia in other diseases classified elsewhere, unspecified severity, with mood disturbance: Secondary | ICD-10-CM | POA: Diagnosis not present

## 2022-11-09 DIAGNOSIS — I5033 Acute on chronic diastolic (congestive) heart failure: Secondary | ICD-10-CM | POA: Diagnosis not present

## 2022-11-09 DIAGNOSIS — Z792 Long term (current) use of antibiotics: Secondary | ICD-10-CM | POA: Diagnosis not present

## 2022-11-13 DIAGNOSIS — I480 Paroxysmal atrial fibrillation: Secondary | ICD-10-CM | POA: Diagnosis not present

## 2022-11-13 DIAGNOSIS — J449 Chronic obstructive pulmonary disease, unspecified: Secondary | ICD-10-CM | POA: Diagnosis not present

## 2022-11-13 DIAGNOSIS — F0283 Dementia in other diseases classified elsewhere, unspecified severity, with mood disturbance: Secondary | ICD-10-CM | POA: Diagnosis not present

## 2022-11-13 DIAGNOSIS — I4892 Unspecified atrial flutter: Secondary | ICD-10-CM | POA: Diagnosis not present

## 2022-11-13 DIAGNOSIS — Z86711 Personal history of pulmonary embolism: Secondary | ICD-10-CM | POA: Diagnosis not present

## 2022-11-13 DIAGNOSIS — Z7982 Long term (current) use of aspirin: Secondary | ICD-10-CM | POA: Diagnosis not present

## 2022-11-13 DIAGNOSIS — I358 Other nonrheumatic aortic valve disorders: Secondary | ICD-10-CM | POA: Diagnosis not present

## 2022-11-13 DIAGNOSIS — Z950 Presence of cardiac pacemaker: Secondary | ICD-10-CM

## 2022-11-13 DIAGNOSIS — F32A Depression, unspecified: Secondary | ICD-10-CM | POA: Diagnosis not present

## 2022-11-13 DIAGNOSIS — F0284 Dementia in other diseases classified elsewhere, unspecified severity, with anxiety: Secondary | ICD-10-CM | POA: Diagnosis not present

## 2022-11-13 DIAGNOSIS — I5022 Chronic systolic (congestive) heart failure: Secondary | ICD-10-CM | POA: Diagnosis not present

## 2022-11-13 DIAGNOSIS — I5033 Acute on chronic diastolic (congestive) heart failure: Secondary | ICD-10-CM | POA: Diagnosis not present

## 2022-11-13 DIAGNOSIS — E1140 Type 2 diabetes mellitus with diabetic neuropathy, unspecified: Secondary | ICD-10-CM | POA: Diagnosis not present

## 2022-11-13 DIAGNOSIS — L89891 Pressure ulcer of other site, stage 1: Secondary | ICD-10-CM | POA: Diagnosis not present

## 2022-11-13 DIAGNOSIS — Z9181 History of falling: Secondary | ICD-10-CM | POA: Diagnosis not present

## 2022-11-13 DIAGNOSIS — Z792 Long term (current) use of antibiotics: Secondary | ICD-10-CM | POA: Diagnosis not present

## 2022-11-13 DIAGNOSIS — I442 Atrioventricular block, complete: Secondary | ICD-10-CM | POA: Diagnosis not present

## 2022-11-13 DIAGNOSIS — I110 Hypertensive heart disease with heart failure: Secondary | ICD-10-CM | POA: Diagnosis not present

## 2022-11-13 DIAGNOSIS — Z8616 Personal history of COVID-19: Secondary | ICD-10-CM | POA: Diagnosis not present

## 2022-11-13 DIAGNOSIS — Z7984 Long term (current) use of oral hypoglycemic drugs: Secondary | ICD-10-CM | POA: Diagnosis not present

## 2022-11-13 DIAGNOSIS — K219 Gastro-esophageal reflux disease without esophagitis: Secondary | ICD-10-CM | POA: Diagnosis not present

## 2022-11-13 DIAGNOSIS — M1612 Unilateral primary osteoarthritis, left hip: Secondary | ICD-10-CM | POA: Diagnosis not present

## 2022-11-13 DIAGNOSIS — Z6839 Body mass index (BMI) 39.0-39.9, adult: Secondary | ICD-10-CM

## 2022-11-13 DIAGNOSIS — F02818 Dementia in other diseases classified elsewhere, unspecified severity, with other behavioral disturbance: Secondary | ICD-10-CM | POA: Diagnosis not present

## 2022-11-13 DIAGNOSIS — E039 Hypothyroidism, unspecified: Secondary | ICD-10-CM | POA: Diagnosis not present

## 2022-11-17 DIAGNOSIS — J449 Chronic obstructive pulmonary disease, unspecified: Secondary | ICD-10-CM | POA: Diagnosis not present

## 2022-11-17 DIAGNOSIS — Z7982 Long term (current) use of aspirin: Secondary | ICD-10-CM | POA: Diagnosis not present

## 2022-11-17 DIAGNOSIS — F0284 Dementia in other diseases classified elsewhere, unspecified severity, with anxiety: Secondary | ICD-10-CM | POA: Diagnosis not present

## 2022-11-17 DIAGNOSIS — Z86711 Personal history of pulmonary embolism: Secondary | ICD-10-CM | POA: Diagnosis not present

## 2022-11-17 DIAGNOSIS — I110 Hypertensive heart disease with heart failure: Secondary | ICD-10-CM | POA: Diagnosis not present

## 2022-11-17 DIAGNOSIS — F0283 Dementia in other diseases classified elsewhere, unspecified severity, with mood disturbance: Secondary | ICD-10-CM | POA: Diagnosis not present

## 2022-11-17 DIAGNOSIS — Z9181 History of falling: Secondary | ICD-10-CM | POA: Diagnosis not present

## 2022-11-17 DIAGNOSIS — F32A Depression, unspecified: Secondary | ICD-10-CM | POA: Diagnosis not present

## 2022-11-17 DIAGNOSIS — E1140 Type 2 diabetes mellitus with diabetic neuropathy, unspecified: Secondary | ICD-10-CM | POA: Diagnosis not present

## 2022-11-17 DIAGNOSIS — L89891 Pressure ulcer of other site, stage 1: Secondary | ICD-10-CM | POA: Diagnosis not present

## 2022-11-17 DIAGNOSIS — F02818 Dementia in other diseases classified elsewhere, unspecified severity, with other behavioral disturbance: Secondary | ICD-10-CM | POA: Diagnosis not present

## 2022-11-17 DIAGNOSIS — I480 Paroxysmal atrial fibrillation: Secondary | ICD-10-CM | POA: Diagnosis not present

## 2022-11-17 DIAGNOSIS — M1612 Unilateral primary osteoarthritis, left hip: Secondary | ICD-10-CM | POA: Diagnosis not present

## 2022-11-17 DIAGNOSIS — I5033 Acute on chronic diastolic (congestive) heart failure: Secondary | ICD-10-CM | POA: Diagnosis not present

## 2022-11-17 DIAGNOSIS — I358 Other nonrheumatic aortic valve disorders: Secondary | ICD-10-CM | POA: Diagnosis not present

## 2022-11-17 DIAGNOSIS — Z7984 Long term (current) use of oral hypoglycemic drugs: Secondary | ICD-10-CM | POA: Diagnosis not present

## 2022-11-17 DIAGNOSIS — K219 Gastro-esophageal reflux disease without esophagitis: Secondary | ICD-10-CM | POA: Diagnosis not present

## 2022-11-17 DIAGNOSIS — Z8616 Personal history of COVID-19: Secondary | ICD-10-CM | POA: Diagnosis not present

## 2022-11-17 DIAGNOSIS — I5022 Chronic systolic (congestive) heart failure: Secondary | ICD-10-CM | POA: Diagnosis not present

## 2022-11-17 DIAGNOSIS — I4892 Unspecified atrial flutter: Secondary | ICD-10-CM | POA: Diagnosis not present

## 2022-11-17 DIAGNOSIS — Z792 Long term (current) use of antibiotics: Secondary | ICD-10-CM | POA: Diagnosis not present

## 2022-11-17 DIAGNOSIS — I442 Atrioventricular block, complete: Secondary | ICD-10-CM | POA: Diagnosis not present

## 2022-11-17 DIAGNOSIS — E039 Hypothyroidism, unspecified: Secondary | ICD-10-CM | POA: Diagnosis not present

## 2022-11-20 ENCOUNTER — Ambulatory Visit (INDEPENDENT_AMBULATORY_CARE_PROVIDER_SITE_OTHER): Payer: Medicare Other | Admitting: Internal Medicine

## 2022-11-20 ENCOUNTER — Encounter: Payer: Self-pay | Admitting: Internal Medicine

## 2022-11-20 VITALS — BP 122/62 | HR 70 | Temp 97.6°F | Ht 62.0 in | Wt 227.0 lb

## 2022-11-20 DIAGNOSIS — F419 Anxiety disorder, unspecified: Secondary | ICD-10-CM

## 2022-11-20 DIAGNOSIS — W19XXXA Unspecified fall, initial encounter: Secondary | ICD-10-CM

## 2022-11-20 DIAGNOSIS — I10 Essential (primary) hypertension: Secondary | ICD-10-CM

## 2022-11-20 DIAGNOSIS — G8929 Other chronic pain: Secondary | ICD-10-CM

## 2022-11-20 DIAGNOSIS — E118 Type 2 diabetes mellitus with unspecified complications: Secondary | ICD-10-CM | POA: Diagnosis not present

## 2022-11-20 DIAGNOSIS — M545 Low back pain, unspecified: Secondary | ICD-10-CM | POA: Diagnosis not present

## 2022-11-20 DIAGNOSIS — Z794 Long term (current) use of insulin: Secondary | ICD-10-CM

## 2022-11-20 DIAGNOSIS — F03918 Unspecified dementia, unspecified severity, with other behavioral disturbance: Secondary | ICD-10-CM

## 2022-11-20 DIAGNOSIS — N390 Urinary tract infection, site not specified: Secondary | ICD-10-CM | POA: Diagnosis not present

## 2022-11-20 LAB — CBC WITH DIFFERENTIAL/PLATELET
Basophils Absolute: 0 10*3/uL (ref 0.0–0.1)
Basophils Relative: 0.4 % (ref 0.0–3.0)
Eosinophils Absolute: 0.1 10*3/uL (ref 0.0–0.7)
Eosinophils Relative: 0.7 % (ref 0.0–5.0)
HCT: 38 % (ref 36.0–46.0)
Hemoglobin: 12.8 g/dL (ref 12.0–15.0)
Lymphocytes Relative: 31.6 % (ref 12.0–46.0)
Lymphs Abs: 3.2 10*3/uL (ref 0.7–4.0)
MCHC: 33.7 g/dL (ref 30.0–36.0)
MCV: 93.5 fl (ref 78.0–100.0)
Monocytes Absolute: 1.9 10*3/uL — ABNORMAL HIGH (ref 0.1–1.0)
Monocytes Relative: 18.9 % — ABNORMAL HIGH (ref 3.0–12.0)
Neutro Abs: 4.9 10*3/uL (ref 1.4–7.7)
Neutrophils Relative %: 48.4 % (ref 43.0–77.0)
Platelets: 223 10*3/uL (ref 150.0–400.0)
RBC: 4.06 Mil/uL (ref 3.87–5.11)
RDW: 14.4 % (ref 11.5–15.5)
WBC: 10.2 10*3/uL (ref 4.0–10.5)

## 2022-11-20 NOTE — Assessment & Plan Note (Signed)
Mild.

## 2022-11-20 NOTE — Progress Notes (Signed)
Subjective:  Patient ID: Jessica [name], female    DOB: [birth year]  Age: 89 y.o. MRN: [id number]  CC: Medical Management of Chronic Issues (3 month f/u)   HPI Jessica [name] presents for falls at home.  Follow-up on gout, diabetes, hypertension F/u weakness, HTN She is here with [name], her niece.  Outpatient Medications Prior to Visit  Medication Sig Dispense Refill   albuterol (VENTOLIN HFA) 108 (90 Base) MCG/ACT inhaler Inhale 2 puffs into the lungs every 6 (six) hours as needed for wheezing or shortness of breath. 8 g 2   allopurinol (ZYLOPRIM) 100 MG tablet TAKE 1 TABLET BY MOUTH DAILY (Patient taking differently: Take 100 mg by mouth daily.) 100 tablet 2   aspirin EC 81 MG tablet Take 162 mg by mouth daily. Swallow whole.     b complex vitamins tablet Take 1 tablet by mouth daily.     cefadroxil (DURICEF) 500 MG capsule Take 1 capsule (500 mg total) by mouth 2 (two) times daily. 60 capsule 3   Cholecalciferol 1000 UNITS tablet Take 1,000 Units by mouth daily.     CINNAMON PO Take 1 capsule by mouth daily.     empagliflozin (JARDIANCE) 10 MG TABS tablet Take 1 tablet (10 mg total) by mouth daily. 90 tablet 3   furosemide (LASIX) 40 MG tablet Take 40mg  tablet daily with an extra tablet in the afternoon for swelling, weight gain of 2 pounds overnight or 5 pounds in one week! 100 tablet 3   levothyroxine (SYNTHROID) 75 MCG tablet TAKE 1 TABLET BY MOUTH DAILY (Patient taking differently: Take by mouth daily before breakfast.) 100 tablet 2   metFORMIN (GLUCOPHAGE) 500 MG tablet TAKE 1 TABLET BY MOUTH TWICE  DAILY WITH MEALS 200 tablet 2   metoprolol succinate (TOPROL-XL) 50 MG 24 hr tablet TAKE 1 TABLET BY MOUTH DAILY  WITH OR IMMEDIATELY FOLLOWING A  MEAL 100 tablet 2   metoprolol tartrate (LOPRESSOR) 25 MG tablet Take 1 tablet (25 mg total) by mouth 2 (two) times daily. 180 tablet 3   Multiple Vitamin (MULTIVITAMIN) tablet Take 1 tablet by mouth daily.     nystatin powder Apply  1 Application topically daily. Apply to under breast topically every day shift for redness/moisture.     repaglinide (PRANDIN) 2 MG tablet TAKE 2 TABLETS BY MOUTH 3 TIMES  DAILY BEFORE MEALS 600 tablet 2   rosuvastatin (CRESTOR) 10 MG tablet TAKE 1 TABLET BY MOUTH UP TO 3  TIMES WEEKLY AS TOLERATED 43 tablet 2   sacubitril-valsartan (ENTRESTO) 97-103 MG Take 1 tablet by mouth 2 (two) times daily. 180 tablet 3   spironolactone (ALDACTONE) 25 MG tablet Take 0.5 tablets (12.5 mg total) by mouth daily. 100 tablet 3   temazepam (RESTORIL) 15 MG capsule TAKE 1 CAPSULE BY MOUTH EVERY NIGHT AT BEDTIME AS NEEDED 90 capsule 1   venlafaxine XR (EFFEXOR-XR) 75 MG 24 hr capsule TAKE 1 CAPSULE BY MOUTH DAILY  WITH BREAKFAST 100 capsule 1   nitrofurantoin, macrocrystal-monohydrate, (MACROBID) 100 MG capsule Take 1 capsule (100 mg total) by mouth 2 (two) times daily. (Patient not taking: Reported on 11/20/2022) 20 capsule 0   No facility-administered medications prior to visit.    ROS: Review of Systems  Constitutional:  Negative for activity change, appetite change, chills, fatigue and unexpected weight change.  HENT:  Negative for congestion, mouth sores and sinus pressure.   Eyes:  Negative for visual disturbance.  Respiratory:  Negative for cough and chest tightness.  Gastrointestinal:  Negative for abdominal pain and nausea.  Genitourinary:  Negative for difficulty urinating, frequency and vaginal pain.  Musculoskeletal:  Positive for back pain and gait problem.  Skin:  Negative for pallor and rash.  Neurological:  Negative for dizziness, tremors, weakness, numbness and headaches.  Psychiatric/Behavioral:  Negative for confusion, sleep disturbance and suicidal ideas. The patient is not nervous/anxious.     Objective:  BP 122/62 (BP Location: Left Arm, Patient Position: Sitting, Cuff Size: Large)   Pulse 70   Temp 97.6 F (36.4 C) (Temporal)   Ht 5\' 2"  (1.575 m)   Wt 227 lb (103 kg)   SpO2 98%    BMI 41.52 kg/m   BP Readings from Last 3 Encounters:  11/20/22 122/62  10/13/22 124/60  09/28/22 105/70    Wt Readings from Last 3 Encounters:  11/20/22 227 lb (103 kg)  10/13/22 227 lb (103 kg)  09/19/22 222 lb (100.7 kg)    Physical Exam Constitutional:      General: She is not in acute distress.    Appearance: She is well-developed. She is obese.  HENT:     Head: Normocephalic.     Right Ear: External ear normal.     Left Ear: External ear normal.     Nose: Nose normal.  Eyes:     General:        Right eye: No discharge.        Left eye: No discharge.     Conjunctiva/sclera: Conjunctivae normal.     Pupils: Pupils are equal, round, and reactive to light.  Neck:     Thyroid: No thyromegaly.     Vascular: No JVD.     Trachea: No tracheal deviation.  Cardiovascular:     Rate and Rhythm: Normal rate and regular rhythm.     Heart sounds: Normal heart sounds.  Pulmonary:     Effort: No respiratory distress.     Breath sounds: No stridor. No wheezing.  Abdominal:     General: Bowel sounds are normal. There is no distension.     Palpations: Abdomen is soft. There is no mass.     Tenderness: There is no abdominal tenderness. There is no guarding or rebound.  Musculoskeletal:        General: No tenderness.     Cervical back: Normal range of motion and neck supple. No rigidity.  Lymphadenopathy:     Cervical: No cervical adenopathy.  Skin:    Findings: No erythema or rash.  Neurological:     Mental Status: She is oriented to person, place, and time.     Cranial Nerves: No cranial nerve deficit.     Motor: Weakness present. No abnormal muscle tone.     Coordination: Coordination normal.     Gait: Gait abnormal.     Deep Tendon Reflexes: Reflexes normal.  Psychiatric:        Behavior: Behavior normal.        Thought Content: Thought content normal.        Judgment: Judgment normal.   In a w/c  It is a complex case   A total time of 45 minutes was spent  preparing to see the patient, reviewing tests, x-rays, operative reports and other medical records.  Also, obtaining history and performing comprehensive physical exam.  Additionally, counseling the patient regarding the above listed issues.   Finally, documenting clinical information in the health records, coordination of care, educating the patient -diabetes, hypertension, renal failure and other  problems it is a complex case.   Lab Results  Component Value Date   WBC 6.7 07/14/2022   HGB 11.7 07/14/2022   HCT 35.4 07/14/2022   PLT 244 07/14/2022   GLUCOSE 165 (H) 08/14/2022   CHOL 207 (H) 07/12/2020   TRIG 277 (H) 07/12/2020   HDL 39 (L) 07/12/2020   LDLDIRECT 155.9 09/30/2012   LDLCALC 119 (H) 07/12/2020   ALT 20 08/14/2022   AST 29 08/14/2022   NA 136 08/14/2022   K 4.7 08/14/2022   CL 100 08/14/2022   CREATININE 0.96 08/14/2022   BUN 25 (H) 08/14/2022   CO2 28 08/14/2022   TSH 3.43 08/14/2022   INR 3.9 (H) 04/25/2022   HGBA1C 8.1 (H) 08/14/2022   MICROALBUR 4.5 (H) 12/02/2009    DG Hip Unilat With Pelvis 2-3 Views Left  Result Date: 05/14/2022 CLINICAL DATA:  Cough, weakness, hip pain EXAM: DG HIP (WITH OR WITHOUT PELVIS) 2-3V LEFT COMPARISON:  None Available. FINDINGS: No acute fracture or dislocation. No aggressive osseous lesion. Normal alignment. Mild osteoarthritis of the left hip. Generalized osteopenia. Soft tissue are unremarkable. No radiopaque foreign body or soft tissue emphysema. IMPRESSION: 1. Mild osteoarthritis of the left hip. Given the patient's age and osteopenia, if there is persistent clinical concern for an occult hip fracture, a MRI of the hip is recommended for increased sensitivity. Electronically Signed   By: Elige Ko M.D.   On: 05/14/2022 09:35   DG Chest Port 1 View  Result Date: 05/14/2022 CLINICAL DATA:  Cough, pain EXAM: PORTABLE CHEST 1 VIEW COMPARISON:  04/25/2022 FINDINGS: Bilateral interstitial thickening. Small bilateral pleural  effusions. Bibasilar atelectasis. No pneumothorax. Stable cardiomegaly. Prior TAVR. Dual lead cardiac pacemaker. No acute osseous abnormality. IMPRESSION: 1. Findings concerning for CHF. Electronically Signed   By: Elige Ko M.D.   On: 05/14/2022 09:34    Assessment & Plan:   Problem List Items Addressed This Visit     DM type 2, controlled, with complication (HCC)     She can continue with repaglinide and metformin.  We can add  Actos - she is to let me know if she is willing to start using insulin. Check A1c      Relevant Orders   CBC with Differential/Platelet   Comprehensive metabolic panel   Urinalysis   Anxiety disorder    Chronic  Continue on lorazepam prn  Potential benefits of a long term prn benzodiazepines  use as well as potential risks  and complications were explained to the patient and were aknowledged.      Essential hypertension    Chronic Not well controlled Risks associated with diet and treatment noncompliance were discussed. Compliance was encouraged. D/c on Triamt/HCTZ, Losartan Continue on Entresto, Metoprolol      Relevant Orders   CBC with Differential/Platelet   Low back pain    Get a rollator walker; use a w/c      Relevant Orders   Ambulatory referral to Home Health   Fall - Primary    Treated w/Macrobid for a UTI UA OT/PT at home      Relevant Orders   Ambulatory referral to Home Health   CBC with Differential/Platelet   Urinalysis   Dementia with behavioral disturbance (HCC)    Mild       UTI (urinary tract infection)    Treated w/Macrobid for a UTI UA      Relevant Orders   CBC with Differential/Platelet   Comprehensive metabolic panel  Urinalysis      No orders of the defined types were placed in this encounter.     Follow-up: Return in about 3 months (around 02/20/2023) for a follow-up visit.  Sonda Primes, MD

## 2022-11-20 NOTE — Assessment & Plan Note (Signed)
Chronic Not well controlled Risks associated with diet and treatment noncompliance were discussed. Compliance was encouraged. D/c on Triamt/HCTZ, Losartan Continue on Entresto, Metoprolol

## 2022-11-20 NOTE — Assessment & Plan Note (Signed)
Get a rollator walker; use a w/c

## 2022-11-20 NOTE — Assessment & Plan Note (Signed)
Treated w/Macrobid for a UTI UA OT/PT at home

## 2022-11-20 NOTE — Assessment & Plan Note (Addendum)
She can continue with repaglinide and Jardiance.  We can add  Actos - she is to let me know if she is willing to start using insulin. Check A1c

## 2022-11-20 NOTE — Assessment & Plan Note (Signed)
Treated w/Macrobid for a UTI UA

## 2022-11-20 NOTE — Assessment & Plan Note (Signed)
Chronic  Continue on lorazepam prn  Potential benefits of a long term prn benzodiazepines  use as well as potential risks  and complications were explained to the patient and were aknowledged. 

## 2022-11-21 ENCOUNTER — Ambulatory Visit (INDEPENDENT_AMBULATORY_CARE_PROVIDER_SITE_OTHER): Payer: Medicare Other

## 2022-11-21 DIAGNOSIS — I442 Atrioventricular block, complete: Secondary | ICD-10-CM | POA: Diagnosis not present

## 2022-11-21 DIAGNOSIS — Z7984 Long term (current) use of oral hypoglycemic drugs: Secondary | ICD-10-CM | POA: Diagnosis not present

## 2022-11-21 DIAGNOSIS — I110 Hypertensive heart disease with heart failure: Secondary | ICD-10-CM | POA: Diagnosis not present

## 2022-11-21 DIAGNOSIS — E1140 Type 2 diabetes mellitus with diabetic neuropathy, unspecified: Secondary | ICD-10-CM | POA: Diagnosis not present

## 2022-11-21 DIAGNOSIS — I480 Paroxysmal atrial fibrillation: Secondary | ICD-10-CM | POA: Diagnosis not present

## 2022-11-21 DIAGNOSIS — Z8616 Personal history of COVID-19: Secondary | ICD-10-CM | POA: Diagnosis not present

## 2022-11-21 DIAGNOSIS — J449 Chronic obstructive pulmonary disease, unspecified: Secondary | ICD-10-CM | POA: Diagnosis not present

## 2022-11-21 DIAGNOSIS — E039 Hypothyroidism, unspecified: Secondary | ICD-10-CM | POA: Diagnosis not present

## 2022-11-21 DIAGNOSIS — F32A Depression, unspecified: Secondary | ICD-10-CM | POA: Diagnosis not present

## 2022-11-21 DIAGNOSIS — I358 Other nonrheumatic aortic valve disorders: Secondary | ICD-10-CM | POA: Diagnosis not present

## 2022-11-21 DIAGNOSIS — L89891 Pressure ulcer of other site, stage 1: Secondary | ICD-10-CM | POA: Diagnosis not present

## 2022-11-21 DIAGNOSIS — M1612 Unilateral primary osteoarthritis, left hip: Secondary | ICD-10-CM | POA: Diagnosis not present

## 2022-11-21 DIAGNOSIS — F0284 Dementia in other diseases classified elsewhere, unspecified severity, with anxiety: Secondary | ICD-10-CM | POA: Diagnosis not present

## 2022-11-21 DIAGNOSIS — I5022 Chronic systolic (congestive) heart failure: Secondary | ICD-10-CM | POA: Diagnosis not present

## 2022-11-21 DIAGNOSIS — Z792 Long term (current) use of antibiotics: Secondary | ICD-10-CM | POA: Diagnosis not present

## 2022-11-21 DIAGNOSIS — Z9181 History of falling: Secondary | ICD-10-CM | POA: Diagnosis not present

## 2022-11-21 DIAGNOSIS — Z7982 Long term (current) use of aspirin: Secondary | ICD-10-CM | POA: Diagnosis not present

## 2022-11-21 DIAGNOSIS — F02818 Dementia in other diseases classified elsewhere, unspecified severity, with other behavioral disturbance: Secondary | ICD-10-CM | POA: Diagnosis not present

## 2022-11-21 DIAGNOSIS — I4892 Unspecified atrial flutter: Secondary | ICD-10-CM | POA: Diagnosis not present

## 2022-11-21 DIAGNOSIS — F0283 Dementia in other diseases classified elsewhere, unspecified severity, with mood disturbance: Secondary | ICD-10-CM | POA: Diagnosis not present

## 2022-11-21 DIAGNOSIS — Z86711 Personal history of pulmonary embolism: Secondary | ICD-10-CM | POA: Diagnosis not present

## 2022-11-21 DIAGNOSIS — K219 Gastro-esophageal reflux disease without esophagitis: Secondary | ICD-10-CM | POA: Diagnosis not present

## 2022-11-21 DIAGNOSIS — I5033 Acute on chronic diastolic (congestive) heart failure: Secondary | ICD-10-CM | POA: Diagnosis not present

## 2022-11-21 LAB — CUP PACEART REMOTE DEVICE CHECK
Battery Impedance: 1788 Ohm
Battery Remaining Longevity: 39 mo
Battery Voltage: 2.76 V
Brady Statistic AP VP Percent: 12 %
Brady Statistic AP VS Percent: 0 %
Brady Statistic AS VP Percent: 88 %
Brady Statistic AS VS Percent: 0 %
Date Time Interrogation Session: 20240820134331
Implantable Lead Connection Status: 753985
Implantable Lead Connection Status: 753985
Implantable Lead Implant Date: 20140710
Implantable Lead Implant Date: 20140710
Implantable Lead Location: 753859
Implantable Lead Location: 753860
Implantable Lead Model: 5092
Implantable Lead Model: 5592
Implantable Pulse Generator Implant Date: 20140710
Lead Channel Impedance Value: 519 Ohm
Lead Channel Impedance Value: 717 Ohm
Lead Channel Pacing Threshold Amplitude: 0.5 V
Lead Channel Pacing Threshold Amplitude: 0.625 V
Lead Channel Pacing Threshold Pulse Width: 0.4 ms
Lead Channel Pacing Threshold Pulse Width: 0.4 ms
Lead Channel Setting Pacing Amplitude: 2 V
Lead Channel Setting Pacing Amplitude: 2.5 V
Lead Channel Setting Pacing Pulse Width: 0.4 ms
Lead Channel Setting Sensing Sensitivity: 4 mV
Zone Setting Status: 755011
Zone Setting Status: 755011

## 2022-11-21 LAB — COMPREHENSIVE METABOLIC PANEL
ALT: 32 U/L (ref 0–35)
AST: 27 U/L (ref 0–37)
Albumin: 4.3 g/dL (ref 3.5–5.2)
Alkaline Phosphatase: 96 U/L (ref 39–117)
BUN: 28 mg/dL — ABNORMAL HIGH (ref 6–23)
CO2: 22 mEq/L (ref 19–32)
Calcium: 10.3 mg/dL (ref 8.4–10.5)
Chloride: 104 meq/L (ref 96–112)
Creatinine, Ser: 1.34 mg/dL — ABNORMAL HIGH (ref 0.40–1.20)
GFR: 35.74 mL/min — ABNORMAL LOW (ref >60.00)
Glucose, Bld: 162 mg/dL — ABNORMAL HIGH (ref 70–99)
Potassium: 4.7 meq/L (ref 3.5–5.1)
Sodium: 136 meq/L (ref 135–145)
Total Bilirubin: 0.4 mg/dL (ref 0.2–1.2)
Total Protein: 8.4 g/dL — ABNORMAL HIGH (ref 6.0–8.3)

## 2022-11-22 ENCOUNTER — Ambulatory Visit: Payer: Self-pay

## 2022-11-22 NOTE — Patient Outreach (Signed)
  Care Coordination   Follow Up Visit Note   11/22/2022 Name: Jessica [name] MRN: [id number] DOB: [birth year]  Jessica [name] is a 89 y.o. year old female who sees Plotnikov, Georgina Quint, MD for primary care. I spoke with  Jessica [name] by phone today.  What matters to the patients health and wellness today?  Patient reports office visit with Primary provider on 11/20/22-reports about 3 falls in the past two months. Treated for UTI-states she has taken all antibiotics as ordered.   Goals Addressed             This Visit's Progress    Care Coordination-assist with health management       Interventions Today    Flowsheet Row Most Recent Value  Chronic Disease   Chronic disease during today's visit Diabetes, Hypertension (HTN)  General Interventions   General Interventions Discussed/Reviewed Doctor Visits  Doctor Visits Discussed/Reviewed Doctor Visits Reviewed, PCP  PCP/Specialist Visits Compliance with follow-up visit  [upcoming appointments reviewed]  Education Interventions   Education Provided Provided Education  Provided Verbal Education On Other, Blood Sugar Monitoring, Medication  [advised to take medications as prescribed, attend provider visits as recommended, discussed checking blood sugars]  Safety Interventions   Safety Discussed/Reviewed Fall Risk, Safety Reviewed, Home Safety  [confirmed active with home health, confirmed patient has and uses life alert system, discussed fall prevention strategies]            SDOH assessments and interventions completed:  Yes  SDOH Interventions Today    Flowsheet Row Most Recent Value  SDOH Interventions   Food Insecurity Interventions Intervention Not Indicated  Housing Interventions Intervention Not Indicated  Transportation Interventions Intervention Not Indicated  Utilities Interventions Intervention Not Indicated     Care Coordination Interventions:  Yes, provided   Follow up plan: Follow up call scheduled  for 12/22/22    Encounter Outcome:  Pt. Visit Completed   Jessica Sheriff, RN, MSN, BSN, CCM Care Management Coordinator [phone]

## 2022-11-22 NOTE — Patient Instructions (Addendum)
Visit Information  Thank you for taking time to visit with me today. Please don't hesitate to contact me if I can be of assistance to you.   Following are the goals we discussed today:  Continue to take medications as prescribed. Continue to attend provider visits as scheduled Continue to eat healthy, lean meats, vegetables, fruits, avoid saturated and transfats Check blood sugar as recommended and notify provider if questions or concerns   Our next appointment is by telephone on 12/22/22 at 10:00 am  Please call the care guide team at [phone] if you need to cancel or reschedule your appointment.   If you are experiencing a Mental Health or Behavioral Health Crisis or need someone to talk to, please call the Suicide and Crisis Lifeline: 988 call the [place] National Suicide Prevention Lifeline: 408-024-2471 or TTY: 437-879-6658 TTY 713 770 6163) to talk to a trained counselor call 1-800-273-TALK (toll free, 24 hour hotline)  [name], RN, MSN, BSN, CCM Care Management Coordinator [phone]   Urinary Tract Infection, Adult A urinary tract infection (UTI) is an infection of any part of the urinary tract. The urinary tract includes: The kidneys. The ureters. The bladder. The urethra. These organs make, store, and get rid of pee (urine) in the body. What are the causes? This infection is caused by germs (bacteria) in your genital area. These germs grow and cause swelling (inflammation) of your urinary tract. What increases the risk? The following factors may make you more likely to develop this condition: Using a small, thin tube (catheter) to drain pee. Not being able to control when you pee or poop (incontinence). Being female. If you are female, these things can increase the risk: Using these methods to prevent pregnancy: A medicine that kills sperm (spermicide). A device that blocks sperm (diaphragm). Having low levels of a female hormone (estrogen). Being  pregnant. You are more likely to develop this condition if: You have genes that add to your risk. You are sexually active. You take antibiotic medicines. You have trouble peeing because of: A prostate that is bigger than normal, if you are female. A blockage in the part of your body that drains pee from the bladder. A kidney stone. A nerve condition that affects your bladder. Not getting enough to drink. Not peeing often enough. You have other conditions, such as: Diabetes. A weak disease-fighting system (immune system). Sickle cell disease. Gout. Injury of the spine. What are the signs or symptoms? Symptoms of this condition include: Needing to pee right away. Peeing small amounts often. Pain or burning when peeing. Blood in the pee. Pee that smells bad or not like normal. Trouble peeing. Pee that is cloudy. Fluid coming from the vagina, if you are female. Pain in the belly or lower back. Other symptoms include: Vomiting. Not feeling hungry. Feeling mixed up (confused). This may be the first symptom in older adults. Being tired and grouchy (irritable). A fever. Watery poop (diarrhea). How is this treated? Taking antibiotic medicine. Taking other medicines. Drinking enough water. In some cases, you may need to see a specialist. Follow these instructions at home:  Medicines Take over-the-counter and prescription medicines only as told by your doctor. If you were prescribed an antibiotic medicine, take it as told by your doctor. Do not stop taking it even if you start to feel better. General instructions Make sure you: Pee until your bladder is empty. Do not hold pee for a long time. Empty your bladder after sex. Wipe from front to back after  peeing or pooping if you are a female. Use each tissue one time when you wipe. Drink enough fluid to keep your pee pale yellow. Keep all follow-up visits. Contact a doctor if: You do not get better after 1-2 days. Your  symptoms go away and then come back. Get help right away if: You have very bad back pain. You have very bad pain in your lower belly. You have a fever. You have chills. You feeling like you will vomit or you vomit. Summary A urinary tract infection (UTI) is an infection of any part of the urinary tract. This condition is caused by germs in your genital area. There are many risk factors for a UTI. Treatment includes antibiotic medicines. Drink enough fluid to keep your pee pale yellow. This information is not intended to replace advice given to you by your health care provider. Make sure you discuss any questions you have with your health care provider. Document Revised: 10/26/2019 Document Reviewed: 10/31/2019 Elsevier Patient Education  2024 [employer].

## 2022-11-23 DIAGNOSIS — F0283 Dementia in other diseases classified elsewhere, unspecified severity, with mood disturbance: Secondary | ICD-10-CM | POA: Diagnosis not present

## 2022-11-23 DIAGNOSIS — Z792 Long term (current) use of antibiotics: Secondary | ICD-10-CM | POA: Diagnosis not present

## 2022-11-23 DIAGNOSIS — I480 Paroxysmal atrial fibrillation: Secondary | ICD-10-CM | POA: Diagnosis not present

## 2022-11-23 DIAGNOSIS — J449 Chronic obstructive pulmonary disease, unspecified: Secondary | ICD-10-CM | POA: Diagnosis not present

## 2022-11-23 DIAGNOSIS — I442 Atrioventricular block, complete: Secondary | ICD-10-CM | POA: Diagnosis not present

## 2022-11-23 DIAGNOSIS — Z7982 Long term (current) use of aspirin: Secondary | ICD-10-CM | POA: Diagnosis not present

## 2022-11-23 DIAGNOSIS — I358 Other nonrheumatic aortic valve disorders: Secondary | ICD-10-CM | POA: Diagnosis not present

## 2022-11-23 DIAGNOSIS — Z9181 History of falling: Secondary | ICD-10-CM | POA: Diagnosis not present

## 2022-11-23 DIAGNOSIS — I110 Hypertensive heart disease with heart failure: Secondary | ICD-10-CM | POA: Diagnosis not present

## 2022-11-23 DIAGNOSIS — K219 Gastro-esophageal reflux disease without esophagitis: Secondary | ICD-10-CM | POA: Diagnosis not present

## 2022-11-23 DIAGNOSIS — F02818 Dementia in other diseases classified elsewhere, unspecified severity, with other behavioral disturbance: Secondary | ICD-10-CM | POA: Diagnosis not present

## 2022-11-23 DIAGNOSIS — L89891 Pressure ulcer of other site, stage 1: Secondary | ICD-10-CM | POA: Diagnosis not present

## 2022-11-23 DIAGNOSIS — I4892 Unspecified atrial flutter: Secondary | ICD-10-CM | POA: Diagnosis not present

## 2022-11-23 DIAGNOSIS — Z8616 Personal history of COVID-19: Secondary | ICD-10-CM | POA: Diagnosis not present

## 2022-11-23 DIAGNOSIS — I5033 Acute on chronic diastolic (congestive) heart failure: Secondary | ICD-10-CM | POA: Diagnosis not present

## 2022-11-23 DIAGNOSIS — M1612 Unilateral primary osteoarthritis, left hip: Secondary | ICD-10-CM | POA: Diagnosis not present

## 2022-11-23 DIAGNOSIS — Z86711 Personal history of pulmonary embolism: Secondary | ICD-10-CM | POA: Diagnosis not present

## 2022-11-23 DIAGNOSIS — E1140 Type 2 diabetes mellitus with diabetic neuropathy, unspecified: Secondary | ICD-10-CM | POA: Diagnosis not present

## 2022-11-23 DIAGNOSIS — F0284 Dementia in other diseases classified elsewhere, unspecified severity, with anxiety: Secondary | ICD-10-CM | POA: Diagnosis not present

## 2022-11-23 DIAGNOSIS — Z7984 Long term (current) use of oral hypoglycemic drugs: Secondary | ICD-10-CM | POA: Diagnosis not present

## 2022-11-23 DIAGNOSIS — F32A Depression, unspecified: Secondary | ICD-10-CM | POA: Diagnosis not present

## 2022-11-23 DIAGNOSIS — E039 Hypothyroidism, unspecified: Secondary | ICD-10-CM | POA: Diagnosis not present

## 2022-11-23 DIAGNOSIS — I5022 Chronic systolic (congestive) heart failure: Secondary | ICD-10-CM | POA: Diagnosis not present

## 2022-11-27 DIAGNOSIS — F0284 Dementia in other diseases classified elsewhere, unspecified severity, with anxiety: Secondary | ICD-10-CM | POA: Diagnosis not present

## 2022-11-27 DIAGNOSIS — Z792 Long term (current) use of antibiotics: Secondary | ICD-10-CM | POA: Diagnosis not present

## 2022-11-27 DIAGNOSIS — Z86711 Personal history of pulmonary embolism: Secondary | ICD-10-CM | POA: Diagnosis not present

## 2022-11-27 DIAGNOSIS — F0283 Dementia in other diseases classified elsewhere, unspecified severity, with mood disturbance: Secondary | ICD-10-CM | POA: Diagnosis not present

## 2022-11-27 DIAGNOSIS — M1612 Unilateral primary osteoarthritis, left hip: Secondary | ICD-10-CM | POA: Diagnosis not present

## 2022-11-27 DIAGNOSIS — Z7984 Long term (current) use of oral hypoglycemic drugs: Secondary | ICD-10-CM | POA: Diagnosis not present

## 2022-11-27 DIAGNOSIS — I5022 Chronic systolic (congestive) heart failure: Secondary | ICD-10-CM | POA: Diagnosis not present

## 2022-11-27 DIAGNOSIS — Z7982 Long term (current) use of aspirin: Secondary | ICD-10-CM | POA: Diagnosis not present

## 2022-11-27 DIAGNOSIS — E039 Hypothyroidism, unspecified: Secondary | ICD-10-CM | POA: Diagnosis not present

## 2022-11-27 DIAGNOSIS — I4892 Unspecified atrial flutter: Secondary | ICD-10-CM | POA: Diagnosis not present

## 2022-11-27 DIAGNOSIS — I358 Other nonrheumatic aortic valve disorders: Secondary | ICD-10-CM | POA: Diagnosis not present

## 2022-11-27 DIAGNOSIS — F32A Depression, unspecified: Secondary | ICD-10-CM | POA: Diagnosis not present

## 2022-11-27 DIAGNOSIS — L89891 Pressure ulcer of other site, stage 1: Secondary | ICD-10-CM | POA: Diagnosis not present

## 2022-11-27 DIAGNOSIS — Z8616 Personal history of COVID-19: Secondary | ICD-10-CM | POA: Diagnosis not present

## 2022-11-27 DIAGNOSIS — Z9181 History of falling: Secondary | ICD-10-CM | POA: Diagnosis not present

## 2022-11-27 DIAGNOSIS — I5033 Acute on chronic diastolic (congestive) heart failure: Secondary | ICD-10-CM | POA: Diagnosis not present

## 2022-11-27 DIAGNOSIS — E1140 Type 2 diabetes mellitus with diabetic neuropathy, unspecified: Secondary | ICD-10-CM | POA: Diagnosis not present

## 2022-11-27 DIAGNOSIS — I480 Paroxysmal atrial fibrillation: Secondary | ICD-10-CM | POA: Diagnosis not present

## 2022-11-27 DIAGNOSIS — I442 Atrioventricular block, complete: Secondary | ICD-10-CM | POA: Diagnosis not present

## 2022-11-27 DIAGNOSIS — J449 Chronic obstructive pulmonary disease, unspecified: Secondary | ICD-10-CM | POA: Diagnosis not present

## 2022-11-27 DIAGNOSIS — F02818 Dementia in other diseases classified elsewhere, unspecified severity, with other behavioral disturbance: Secondary | ICD-10-CM | POA: Diagnosis not present

## 2022-11-27 DIAGNOSIS — I110 Hypertensive heart disease with heart failure: Secondary | ICD-10-CM | POA: Diagnosis not present

## 2022-11-27 DIAGNOSIS — K219 Gastro-esophageal reflux disease without esophagitis: Secondary | ICD-10-CM | POA: Diagnosis not present

## 2022-11-29 ENCOUNTER — Other Ambulatory Visit: Payer: Self-pay | Admitting: Internal Medicine

## 2022-11-29 DIAGNOSIS — I4892 Unspecified atrial flutter: Secondary | ICD-10-CM | POA: Diagnosis not present

## 2022-11-29 DIAGNOSIS — Z8616 Personal history of COVID-19: Secondary | ICD-10-CM | POA: Diagnosis not present

## 2022-11-29 DIAGNOSIS — Z9181 History of falling: Secondary | ICD-10-CM | POA: Diagnosis not present

## 2022-11-29 DIAGNOSIS — I5022 Chronic systolic (congestive) heart failure: Secondary | ICD-10-CM | POA: Diagnosis not present

## 2022-11-29 DIAGNOSIS — J449 Chronic obstructive pulmonary disease, unspecified: Secondary | ICD-10-CM | POA: Diagnosis not present

## 2022-11-29 DIAGNOSIS — Z792 Long term (current) use of antibiotics: Secondary | ICD-10-CM | POA: Diagnosis not present

## 2022-11-29 DIAGNOSIS — I358 Other nonrheumatic aortic valve disorders: Secondary | ICD-10-CM | POA: Diagnosis not present

## 2022-11-29 DIAGNOSIS — I110 Hypertensive heart disease with heart failure: Secondary | ICD-10-CM | POA: Diagnosis not present

## 2022-11-29 DIAGNOSIS — E1140 Type 2 diabetes mellitus with diabetic neuropathy, unspecified: Secondary | ICD-10-CM | POA: Diagnosis not present

## 2022-11-29 DIAGNOSIS — F0283 Dementia in other diseases classified elsewhere, unspecified severity, with mood disturbance: Secondary | ICD-10-CM | POA: Diagnosis not present

## 2022-11-29 DIAGNOSIS — M1612 Unilateral primary osteoarthritis, left hip: Secondary | ICD-10-CM | POA: Diagnosis not present

## 2022-11-29 DIAGNOSIS — F02818 Dementia in other diseases classified elsewhere, unspecified severity, with other behavioral disturbance: Secondary | ICD-10-CM | POA: Diagnosis not present

## 2022-11-29 DIAGNOSIS — I480 Paroxysmal atrial fibrillation: Secondary | ICD-10-CM | POA: Diagnosis not present

## 2022-11-29 DIAGNOSIS — Z7984 Long term (current) use of oral hypoglycemic drugs: Secondary | ICD-10-CM | POA: Diagnosis not present

## 2022-11-29 DIAGNOSIS — I442 Atrioventricular block, complete: Secondary | ICD-10-CM | POA: Diagnosis not present

## 2022-11-29 DIAGNOSIS — I5033 Acute on chronic diastolic (congestive) heart failure: Secondary | ICD-10-CM | POA: Diagnosis not present

## 2022-11-29 DIAGNOSIS — Z7982 Long term (current) use of aspirin: Secondary | ICD-10-CM | POA: Diagnosis not present

## 2022-11-29 DIAGNOSIS — F0284 Dementia in other diseases classified elsewhere, unspecified severity, with anxiety: Secondary | ICD-10-CM | POA: Diagnosis not present

## 2022-11-29 DIAGNOSIS — E039 Hypothyroidism, unspecified: Secondary | ICD-10-CM | POA: Diagnosis not present

## 2022-11-29 DIAGNOSIS — K219 Gastro-esophageal reflux disease without esophagitis: Secondary | ICD-10-CM | POA: Diagnosis not present

## 2022-11-29 DIAGNOSIS — L89891 Pressure ulcer of other site, stage 1: Secondary | ICD-10-CM | POA: Diagnosis not present

## 2022-11-29 DIAGNOSIS — Z86711 Personal history of pulmonary embolism: Secondary | ICD-10-CM | POA: Diagnosis not present

## 2022-11-29 DIAGNOSIS — F32A Depression, unspecified: Secondary | ICD-10-CM | POA: Diagnosis not present

## 2022-12-01 NOTE — Progress Notes (Signed)
Remote pacemaker transmission.   

## 2022-12-05 DIAGNOSIS — M1612 Unilateral primary osteoarthritis, left hip: Secondary | ICD-10-CM | POA: Diagnosis not present

## 2022-12-05 DIAGNOSIS — K219 Gastro-esophageal reflux disease without esophagitis: Secondary | ICD-10-CM | POA: Diagnosis not present

## 2022-12-05 DIAGNOSIS — Z7982 Long term (current) use of aspirin: Secondary | ICD-10-CM | POA: Diagnosis not present

## 2022-12-05 DIAGNOSIS — L89891 Pressure ulcer of other site, stage 1: Secondary | ICD-10-CM | POA: Diagnosis not present

## 2022-12-05 DIAGNOSIS — Z86711 Personal history of pulmonary embolism: Secondary | ICD-10-CM | POA: Diagnosis not present

## 2022-12-05 DIAGNOSIS — Z792 Long term (current) use of antibiotics: Secondary | ICD-10-CM | POA: Diagnosis not present

## 2022-12-05 DIAGNOSIS — I5022 Chronic systolic (congestive) heart failure: Secondary | ICD-10-CM | POA: Diagnosis not present

## 2022-12-05 DIAGNOSIS — I5033 Acute on chronic diastolic (congestive) heart failure: Secondary | ICD-10-CM | POA: Diagnosis not present

## 2022-12-05 DIAGNOSIS — Z8616 Personal history of COVID-19: Secondary | ICD-10-CM | POA: Diagnosis not present

## 2022-12-05 DIAGNOSIS — I358 Other nonrheumatic aortic valve disorders: Secondary | ICD-10-CM | POA: Diagnosis not present

## 2022-12-05 DIAGNOSIS — I480 Paroxysmal atrial fibrillation: Secondary | ICD-10-CM | POA: Diagnosis not present

## 2022-12-05 DIAGNOSIS — I110 Hypertensive heart disease with heart failure: Secondary | ICD-10-CM | POA: Diagnosis not present

## 2022-12-05 DIAGNOSIS — Z9181 History of falling: Secondary | ICD-10-CM | POA: Diagnosis not present

## 2022-12-05 DIAGNOSIS — I442 Atrioventricular block, complete: Secondary | ICD-10-CM | POA: Diagnosis not present

## 2022-12-05 DIAGNOSIS — E1140 Type 2 diabetes mellitus with diabetic neuropathy, unspecified: Secondary | ICD-10-CM | POA: Diagnosis not present

## 2022-12-05 DIAGNOSIS — Z7984 Long term (current) use of oral hypoglycemic drugs: Secondary | ICD-10-CM | POA: Diagnosis not present

## 2022-12-05 DIAGNOSIS — E039 Hypothyroidism, unspecified: Secondary | ICD-10-CM | POA: Diagnosis not present

## 2022-12-05 DIAGNOSIS — J449 Chronic obstructive pulmonary disease, unspecified: Secondary | ICD-10-CM | POA: Diagnosis not present

## 2022-12-05 DIAGNOSIS — I4892 Unspecified atrial flutter: Secondary | ICD-10-CM | POA: Diagnosis not present

## 2022-12-06 DIAGNOSIS — L89891 Pressure ulcer of other site, stage 1: Secondary | ICD-10-CM | POA: Diagnosis not present

## 2022-12-06 DIAGNOSIS — Z7984 Long term (current) use of oral hypoglycemic drugs: Secondary | ICD-10-CM | POA: Diagnosis not present

## 2022-12-06 DIAGNOSIS — J449 Chronic obstructive pulmonary disease, unspecified: Secondary | ICD-10-CM | POA: Diagnosis not present

## 2022-12-06 DIAGNOSIS — K219 Gastro-esophageal reflux disease without esophagitis: Secondary | ICD-10-CM | POA: Diagnosis not present

## 2022-12-06 DIAGNOSIS — E039 Hypothyroidism, unspecified: Secondary | ICD-10-CM | POA: Diagnosis not present

## 2022-12-06 DIAGNOSIS — Z86711 Personal history of pulmonary embolism: Secondary | ICD-10-CM | POA: Diagnosis not present

## 2022-12-06 DIAGNOSIS — Z9181 History of falling: Secondary | ICD-10-CM | POA: Diagnosis not present

## 2022-12-06 DIAGNOSIS — I110 Hypertensive heart disease with heart failure: Secondary | ICD-10-CM | POA: Diagnosis not present

## 2022-12-06 DIAGNOSIS — E1140 Type 2 diabetes mellitus with diabetic neuropathy, unspecified: Secondary | ICD-10-CM | POA: Diagnosis not present

## 2022-12-06 DIAGNOSIS — Z792 Long term (current) use of antibiotics: Secondary | ICD-10-CM | POA: Diagnosis not present

## 2022-12-06 DIAGNOSIS — I4892 Unspecified atrial flutter: Secondary | ICD-10-CM | POA: Diagnosis not present

## 2022-12-06 DIAGNOSIS — Z7982 Long term (current) use of aspirin: Secondary | ICD-10-CM | POA: Diagnosis not present

## 2022-12-06 DIAGNOSIS — M1612 Unilateral primary osteoarthritis, left hip: Secondary | ICD-10-CM | POA: Diagnosis not present

## 2022-12-06 DIAGNOSIS — I5022 Chronic systolic (congestive) heart failure: Secondary | ICD-10-CM | POA: Diagnosis not present

## 2022-12-06 DIAGNOSIS — I5033 Acute on chronic diastolic (congestive) heart failure: Secondary | ICD-10-CM | POA: Diagnosis not present

## 2022-12-06 DIAGNOSIS — I358 Other nonrheumatic aortic valve disorders: Secondary | ICD-10-CM | POA: Diagnosis not present

## 2022-12-06 DIAGNOSIS — I442 Atrioventricular block, complete: Secondary | ICD-10-CM | POA: Diagnosis not present

## 2022-12-06 DIAGNOSIS — I480 Paroxysmal atrial fibrillation: Secondary | ICD-10-CM | POA: Diagnosis not present

## 2022-12-06 DIAGNOSIS — Z8616 Personal history of COVID-19: Secondary | ICD-10-CM | POA: Diagnosis not present

## 2022-12-08 ENCOUNTER — Telehealth: Payer: Self-pay | Admitting: *Deleted

## 2022-12-08 DIAGNOSIS — E1140 Type 2 diabetes mellitus with diabetic neuropathy, unspecified: Secondary | ICD-10-CM | POA: Diagnosis not present

## 2022-12-08 DIAGNOSIS — E039 Hypothyroidism, unspecified: Secondary | ICD-10-CM | POA: Diagnosis not present

## 2022-12-08 DIAGNOSIS — L89891 Pressure ulcer of other site, stage 1: Secondary | ICD-10-CM | POA: Diagnosis not present

## 2022-12-08 DIAGNOSIS — M1612 Unilateral primary osteoarthritis, left hip: Secondary | ICD-10-CM | POA: Diagnosis not present

## 2022-12-08 DIAGNOSIS — Z9181 History of falling: Secondary | ICD-10-CM | POA: Diagnosis not present

## 2022-12-08 DIAGNOSIS — I442 Atrioventricular block, complete: Secondary | ICD-10-CM | POA: Diagnosis not present

## 2022-12-08 DIAGNOSIS — Z7984 Long term (current) use of oral hypoglycemic drugs: Secondary | ICD-10-CM | POA: Diagnosis not present

## 2022-12-08 DIAGNOSIS — I480 Paroxysmal atrial fibrillation: Secondary | ICD-10-CM | POA: Diagnosis not present

## 2022-12-08 DIAGNOSIS — Z792 Long term (current) use of antibiotics: Secondary | ICD-10-CM | POA: Diagnosis not present

## 2022-12-08 DIAGNOSIS — I358 Other nonrheumatic aortic valve disorders: Secondary | ICD-10-CM | POA: Diagnosis not present

## 2022-12-08 DIAGNOSIS — Z7982 Long term (current) use of aspirin: Secondary | ICD-10-CM | POA: Diagnosis not present

## 2022-12-08 DIAGNOSIS — K219 Gastro-esophageal reflux disease without esophagitis: Secondary | ICD-10-CM | POA: Diagnosis not present

## 2022-12-08 DIAGNOSIS — I5033 Acute on chronic diastolic (congestive) heart failure: Secondary | ICD-10-CM | POA: Diagnosis not present

## 2022-12-08 DIAGNOSIS — I4892 Unspecified atrial flutter: Secondary | ICD-10-CM | POA: Diagnosis not present

## 2022-12-08 DIAGNOSIS — J449 Chronic obstructive pulmonary disease, unspecified: Secondary | ICD-10-CM | POA: Diagnosis not present

## 2022-12-08 DIAGNOSIS — I5022 Chronic systolic (congestive) heart failure: Secondary | ICD-10-CM | POA: Diagnosis not present

## 2022-12-08 DIAGNOSIS — Z8616 Personal history of COVID-19: Secondary | ICD-10-CM | POA: Diagnosis not present

## 2022-12-08 DIAGNOSIS — I110 Hypertensive heart disease with heart failure: Secondary | ICD-10-CM | POA: Diagnosis not present

## 2022-12-08 DIAGNOSIS — Z86711 Personal history of pulmonary embolism: Secondary | ICD-10-CM | POA: Diagnosis not present

## 2022-12-08 NOTE — Telephone Encounter (Signed)
   Telephone encounter was:  Successful.  12/08/2022 Name: Jessica [name] MRN: [id number] DOB: 05-05-1933  Jessica [name] is a 89 y.o. year old female who is a primary care patient of Plotnikov, Georgina Quint, MD . The community resource team was consulted for assistance with Food Insecurity  Care guide performed the following interventions: Follow up call placed to the patient to discuss status of referral. following up on Moms meals she was approved but they are not showing in system  Follow Up Plan: Will reach out when I get a response back   Dione Booze [facility] Careguide  Direct Dial: [phone] Website: [email or web address]

## 2022-12-11 DIAGNOSIS — J449 Chronic obstructive pulmonary disease, unspecified: Secondary | ICD-10-CM | POA: Diagnosis not present

## 2022-12-11 DIAGNOSIS — I5033 Acute on chronic diastolic (congestive) heart failure: Secondary | ICD-10-CM | POA: Diagnosis not present

## 2022-12-11 DIAGNOSIS — M1612 Unilateral primary osteoarthritis, left hip: Secondary | ICD-10-CM | POA: Diagnosis not present

## 2022-12-11 DIAGNOSIS — I4892 Unspecified atrial flutter: Secondary | ICD-10-CM | POA: Diagnosis not present

## 2022-12-11 DIAGNOSIS — Z7982 Long term (current) use of aspirin: Secondary | ICD-10-CM | POA: Diagnosis not present

## 2022-12-11 DIAGNOSIS — I110 Hypertensive heart disease with heart failure: Secondary | ICD-10-CM | POA: Diagnosis not present

## 2022-12-11 DIAGNOSIS — Z7984 Long term (current) use of oral hypoglycemic drugs: Secondary | ICD-10-CM | POA: Diagnosis not present

## 2022-12-11 DIAGNOSIS — I480 Paroxysmal atrial fibrillation: Secondary | ICD-10-CM | POA: Diagnosis not present

## 2022-12-11 DIAGNOSIS — L89891 Pressure ulcer of other site, stage 1: Secondary | ICD-10-CM | POA: Diagnosis not present

## 2022-12-11 DIAGNOSIS — I5022 Chronic systolic (congestive) heart failure: Secondary | ICD-10-CM | POA: Diagnosis not present

## 2022-12-11 DIAGNOSIS — E039 Hypothyroidism, unspecified: Secondary | ICD-10-CM | POA: Diagnosis not present

## 2022-12-11 DIAGNOSIS — I442 Atrioventricular block, complete: Secondary | ICD-10-CM | POA: Diagnosis not present

## 2022-12-11 DIAGNOSIS — I358 Other nonrheumatic aortic valve disorders: Secondary | ICD-10-CM | POA: Diagnosis not present

## 2022-12-11 DIAGNOSIS — Z792 Long term (current) use of antibiotics: Secondary | ICD-10-CM | POA: Diagnosis not present

## 2022-12-11 DIAGNOSIS — E1140 Type 2 diabetes mellitus with diabetic neuropathy, unspecified: Secondary | ICD-10-CM | POA: Diagnosis not present

## 2022-12-11 DIAGNOSIS — Z9181 History of falling: Secondary | ICD-10-CM | POA: Diagnosis not present

## 2022-12-11 DIAGNOSIS — K219 Gastro-esophageal reflux disease without esophagitis: Secondary | ICD-10-CM | POA: Diagnosis not present

## 2022-12-11 DIAGNOSIS — Z86711 Personal history of pulmonary embolism: Secondary | ICD-10-CM | POA: Diagnosis not present

## 2022-12-11 DIAGNOSIS — Z8616 Personal history of COVID-19: Secondary | ICD-10-CM | POA: Diagnosis not present

## 2022-12-12 DIAGNOSIS — I5033 Acute on chronic diastolic (congestive) heart failure: Secondary | ICD-10-CM | POA: Diagnosis not present

## 2022-12-12 DIAGNOSIS — I110 Hypertensive heart disease with heart failure: Secondary | ICD-10-CM | POA: Diagnosis not present

## 2022-12-12 DIAGNOSIS — E039 Hypothyroidism, unspecified: Secondary | ICD-10-CM | POA: Diagnosis not present

## 2022-12-12 DIAGNOSIS — I4892 Unspecified atrial flutter: Secondary | ICD-10-CM | POA: Diagnosis not present

## 2022-12-12 DIAGNOSIS — E1140 Type 2 diabetes mellitus with diabetic neuropathy, unspecified: Secondary | ICD-10-CM | POA: Diagnosis not present

## 2022-12-12 DIAGNOSIS — Z7982 Long term (current) use of aspirin: Secondary | ICD-10-CM | POA: Diagnosis not present

## 2022-12-12 DIAGNOSIS — M1612 Unilateral primary osteoarthritis, left hip: Secondary | ICD-10-CM | POA: Diagnosis not present

## 2022-12-12 DIAGNOSIS — Z7984 Long term (current) use of oral hypoglycemic drugs: Secondary | ICD-10-CM | POA: Diagnosis not present

## 2022-12-12 DIAGNOSIS — I442 Atrioventricular block, complete: Secondary | ICD-10-CM | POA: Diagnosis not present

## 2022-12-12 DIAGNOSIS — Z86711 Personal history of pulmonary embolism: Secondary | ICD-10-CM | POA: Diagnosis not present

## 2022-12-12 DIAGNOSIS — Z792 Long term (current) use of antibiotics: Secondary | ICD-10-CM | POA: Diagnosis not present

## 2022-12-12 DIAGNOSIS — Z8616 Personal history of COVID-19: Secondary | ICD-10-CM | POA: Diagnosis not present

## 2022-12-12 DIAGNOSIS — L89891 Pressure ulcer of other site, stage 1: Secondary | ICD-10-CM | POA: Diagnosis not present

## 2022-12-12 DIAGNOSIS — I5022 Chronic systolic (congestive) heart failure: Secondary | ICD-10-CM | POA: Diagnosis not present

## 2022-12-12 DIAGNOSIS — J449 Chronic obstructive pulmonary disease, unspecified: Secondary | ICD-10-CM | POA: Diagnosis not present

## 2022-12-12 DIAGNOSIS — I480 Paroxysmal atrial fibrillation: Secondary | ICD-10-CM | POA: Diagnosis not present

## 2022-12-12 DIAGNOSIS — I358 Other nonrheumatic aortic valve disorders: Secondary | ICD-10-CM | POA: Diagnosis not present

## 2022-12-12 DIAGNOSIS — Z9181 History of falling: Secondary | ICD-10-CM | POA: Diagnosis not present

## 2022-12-12 DIAGNOSIS — K219 Gastro-esophageal reflux disease without esophagitis: Secondary | ICD-10-CM | POA: Diagnosis not present

## 2022-12-14 ENCOUNTER — Telehealth: Payer: Self-pay | Admitting: Internal Medicine

## 2022-12-14 NOTE — Telephone Encounter (Signed)
Caller & What Company:  [employer] - [facility]   Phone [phone]   Needs Verbal orders for what service & frequency: Home Health PT 1 week x 1, 2 week x 2

## 2022-12-15 DIAGNOSIS — K219 Gastro-esophageal reflux disease without esophagitis: Secondary | ICD-10-CM | POA: Diagnosis not present

## 2022-12-15 DIAGNOSIS — M1612 Unilateral primary osteoarthritis, left hip: Secondary | ICD-10-CM | POA: Diagnosis not present

## 2022-12-15 DIAGNOSIS — I110 Hypertensive heart disease with heart failure: Secondary | ICD-10-CM | POA: Diagnosis not present

## 2022-12-15 DIAGNOSIS — I4892 Unspecified atrial flutter: Secondary | ICD-10-CM | POA: Diagnosis not present

## 2022-12-15 DIAGNOSIS — I480 Paroxysmal atrial fibrillation: Secondary | ICD-10-CM | POA: Diagnosis not present

## 2022-12-15 DIAGNOSIS — Z86711 Personal history of pulmonary embolism: Secondary | ICD-10-CM | POA: Diagnosis not present

## 2022-12-15 DIAGNOSIS — Z7984 Long term (current) use of oral hypoglycemic drugs: Secondary | ICD-10-CM | POA: Diagnosis not present

## 2022-12-15 DIAGNOSIS — Z792 Long term (current) use of antibiotics: Secondary | ICD-10-CM | POA: Diagnosis not present

## 2022-12-15 DIAGNOSIS — Z7982 Long term (current) use of aspirin: Secondary | ICD-10-CM | POA: Diagnosis not present

## 2022-12-15 DIAGNOSIS — Z8616 Personal history of COVID-19: Secondary | ICD-10-CM | POA: Diagnosis not present

## 2022-12-15 DIAGNOSIS — I5033 Acute on chronic diastolic (congestive) heart failure: Secondary | ICD-10-CM | POA: Diagnosis not present

## 2022-12-15 DIAGNOSIS — I5022 Chronic systolic (congestive) heart failure: Secondary | ICD-10-CM | POA: Diagnosis not present

## 2022-12-15 DIAGNOSIS — I442 Atrioventricular block, complete: Secondary | ICD-10-CM | POA: Diagnosis not present

## 2022-12-15 DIAGNOSIS — I358 Other nonrheumatic aortic valve disorders: Secondary | ICD-10-CM | POA: Diagnosis not present

## 2022-12-15 DIAGNOSIS — E1140 Type 2 diabetes mellitus with diabetic neuropathy, unspecified: Secondary | ICD-10-CM | POA: Diagnosis not present

## 2022-12-15 DIAGNOSIS — L89891 Pressure ulcer of other site, stage 1: Secondary | ICD-10-CM | POA: Diagnosis not present

## 2022-12-15 DIAGNOSIS — J449 Chronic obstructive pulmonary disease, unspecified: Secondary | ICD-10-CM | POA: Diagnosis not present

## 2022-12-15 DIAGNOSIS — E039 Hypothyroidism, unspecified: Secondary | ICD-10-CM | POA: Diagnosis not present

## 2022-12-15 DIAGNOSIS — Z9181 History of falling: Secondary | ICD-10-CM | POA: Diagnosis not present

## 2022-12-16 NOTE — Telephone Encounter (Signed)
Okay. Thank you.

## 2022-12-18 DIAGNOSIS — I480 Paroxysmal atrial fibrillation: Secondary | ICD-10-CM | POA: Diagnosis not present

## 2022-12-18 DIAGNOSIS — Z9181 History of falling: Secondary | ICD-10-CM | POA: Diagnosis not present

## 2022-12-18 DIAGNOSIS — L89891 Pressure ulcer of other site, stage 1: Secondary | ICD-10-CM | POA: Diagnosis not present

## 2022-12-18 DIAGNOSIS — I442 Atrioventricular block, complete: Secondary | ICD-10-CM | POA: Diagnosis not present

## 2022-12-18 DIAGNOSIS — I358 Other nonrheumatic aortic valve disorders: Secondary | ICD-10-CM | POA: Diagnosis not present

## 2022-12-18 DIAGNOSIS — Z8616 Personal history of COVID-19: Secondary | ICD-10-CM | POA: Diagnosis not present

## 2022-12-18 DIAGNOSIS — Z86711 Personal history of pulmonary embolism: Secondary | ICD-10-CM | POA: Diagnosis not present

## 2022-12-18 DIAGNOSIS — E1140 Type 2 diabetes mellitus with diabetic neuropathy, unspecified: Secondary | ICD-10-CM | POA: Diagnosis not present

## 2022-12-18 DIAGNOSIS — E039 Hypothyroidism, unspecified: Secondary | ICD-10-CM | POA: Diagnosis not present

## 2022-12-18 DIAGNOSIS — Z7982 Long term (current) use of aspirin: Secondary | ICD-10-CM | POA: Diagnosis not present

## 2022-12-18 DIAGNOSIS — J449 Chronic obstructive pulmonary disease, unspecified: Secondary | ICD-10-CM | POA: Diagnosis not present

## 2022-12-18 DIAGNOSIS — I110 Hypertensive heart disease with heart failure: Secondary | ICD-10-CM | POA: Diagnosis not present

## 2022-12-18 DIAGNOSIS — M1612 Unilateral primary osteoarthritis, left hip: Secondary | ICD-10-CM | POA: Diagnosis not present

## 2022-12-18 DIAGNOSIS — Z792 Long term (current) use of antibiotics: Secondary | ICD-10-CM | POA: Diagnosis not present

## 2022-12-18 DIAGNOSIS — I5033 Acute on chronic diastolic (congestive) heart failure: Secondary | ICD-10-CM | POA: Diagnosis not present

## 2022-12-18 DIAGNOSIS — I5022 Chronic systolic (congestive) heart failure: Secondary | ICD-10-CM | POA: Diagnosis not present

## 2022-12-18 DIAGNOSIS — Z7984 Long term (current) use of oral hypoglycemic drugs: Secondary | ICD-10-CM | POA: Diagnosis not present

## 2022-12-18 DIAGNOSIS — I4892 Unspecified atrial flutter: Secondary | ICD-10-CM | POA: Diagnosis not present

## 2022-12-18 DIAGNOSIS — K219 Gastro-esophageal reflux disease without esophagitis: Secondary | ICD-10-CM | POA: Diagnosis not present

## 2022-12-18 NOTE — Telephone Encounter (Signed)
Left secure Voice mail for Verbal orders W/ Tresa Endo from [place].

## 2022-12-19 ENCOUNTER — Telehealth: Payer: Self-pay | Admitting: *Deleted

## 2022-12-19 DIAGNOSIS — K219 Gastro-esophageal reflux disease without esophagitis: Secondary | ICD-10-CM | POA: Diagnosis not present

## 2022-12-19 DIAGNOSIS — E039 Hypothyroidism, unspecified: Secondary | ICD-10-CM | POA: Diagnosis not present

## 2022-12-19 DIAGNOSIS — Z792 Long term (current) use of antibiotics: Secondary | ICD-10-CM | POA: Diagnosis not present

## 2022-12-19 DIAGNOSIS — I4892 Unspecified atrial flutter: Secondary | ICD-10-CM | POA: Diagnosis not present

## 2022-12-19 DIAGNOSIS — I110 Hypertensive heart disease with heart failure: Secondary | ICD-10-CM | POA: Diagnosis not present

## 2022-12-19 DIAGNOSIS — I358 Other nonrheumatic aortic valve disorders: Secondary | ICD-10-CM | POA: Diagnosis not present

## 2022-12-19 DIAGNOSIS — Z8616 Personal history of COVID-19: Secondary | ICD-10-CM | POA: Diagnosis not present

## 2022-12-19 DIAGNOSIS — J449 Chronic obstructive pulmonary disease, unspecified: Secondary | ICD-10-CM | POA: Diagnosis not present

## 2022-12-19 DIAGNOSIS — Z7984 Long term (current) use of oral hypoglycemic drugs: Secondary | ICD-10-CM | POA: Diagnosis not present

## 2022-12-19 DIAGNOSIS — I442 Atrioventricular block, complete: Secondary | ICD-10-CM | POA: Diagnosis not present

## 2022-12-19 DIAGNOSIS — L89891 Pressure ulcer of other site, stage 1: Secondary | ICD-10-CM | POA: Diagnosis not present

## 2022-12-19 DIAGNOSIS — Z7982 Long term (current) use of aspirin: Secondary | ICD-10-CM | POA: Diagnosis not present

## 2022-12-19 DIAGNOSIS — Z86711 Personal history of pulmonary embolism: Secondary | ICD-10-CM | POA: Diagnosis not present

## 2022-12-19 DIAGNOSIS — M1612 Unilateral primary osteoarthritis, left hip: Secondary | ICD-10-CM | POA: Diagnosis not present

## 2022-12-19 DIAGNOSIS — E1140 Type 2 diabetes mellitus with diabetic neuropathy, unspecified: Secondary | ICD-10-CM | POA: Diagnosis not present

## 2022-12-19 DIAGNOSIS — I480 Paroxysmal atrial fibrillation: Secondary | ICD-10-CM | POA: Diagnosis not present

## 2022-12-19 DIAGNOSIS — Z9181 History of falling: Secondary | ICD-10-CM | POA: Diagnosis not present

## 2022-12-19 DIAGNOSIS — I5033 Acute on chronic diastolic (congestive) heart failure: Secondary | ICD-10-CM | POA: Diagnosis not present

## 2022-12-19 DIAGNOSIS — I5022 Chronic systolic (congestive) heart failure: Secondary | ICD-10-CM | POA: Diagnosis not present

## 2022-12-19 NOTE — Telephone Encounter (Signed)
Telephone encounter was:  Successful.  12/19/2022 Name: Jessica [name] MRN: [id number] DOB: [birth year]  Jessica [name] is a 89 y.o. year old female who is a primary care patient of Plotnikov, Georgina Quint, MD . The community resource team was consulted for assistance with Food Insecurity niece called to follow up on moms meals , The member is currently receiving meals till 12/28/2022. Please resubmit the request for services closer to the members current authorization end date provided relayed this information to the aunt  Care guide performed the following interventions: Patient provided with information about care guide support team and interviewed to confirm resource needs.  Follow Up Plan:  No further follow up planned at this time. The patient has been provided with needed resources. Dione Booze [facility]  Population Health Careguide  Direct Dial: [phone] Website: [email or web address]

## 2022-12-21 DIAGNOSIS — M1612 Unilateral primary osteoarthritis, left hip: Secondary | ICD-10-CM | POA: Diagnosis not present

## 2022-12-21 DIAGNOSIS — L89891 Pressure ulcer of other site, stage 1: Secondary | ICD-10-CM | POA: Diagnosis not present

## 2022-12-21 DIAGNOSIS — J449 Chronic obstructive pulmonary disease, unspecified: Secondary | ICD-10-CM | POA: Diagnosis not present

## 2022-12-21 DIAGNOSIS — I442 Atrioventricular block, complete: Secondary | ICD-10-CM | POA: Diagnosis not present

## 2022-12-21 DIAGNOSIS — Z86711 Personal history of pulmonary embolism: Secondary | ICD-10-CM | POA: Diagnosis not present

## 2022-12-21 DIAGNOSIS — Z792 Long term (current) use of antibiotics: Secondary | ICD-10-CM | POA: Diagnosis not present

## 2022-12-21 DIAGNOSIS — E039 Hypothyroidism, unspecified: Secondary | ICD-10-CM | POA: Diagnosis not present

## 2022-12-21 DIAGNOSIS — I480 Paroxysmal atrial fibrillation: Secondary | ICD-10-CM | POA: Diagnosis not present

## 2022-12-21 DIAGNOSIS — I5022 Chronic systolic (congestive) heart failure: Secondary | ICD-10-CM | POA: Diagnosis not present

## 2022-12-21 DIAGNOSIS — I110 Hypertensive heart disease with heart failure: Secondary | ICD-10-CM | POA: Diagnosis not present

## 2022-12-21 DIAGNOSIS — Z8616 Personal history of COVID-19: Secondary | ICD-10-CM | POA: Diagnosis not present

## 2022-12-21 DIAGNOSIS — Z7982 Long term (current) use of aspirin: Secondary | ICD-10-CM | POA: Diagnosis not present

## 2022-12-21 DIAGNOSIS — I358 Other nonrheumatic aortic valve disorders: Secondary | ICD-10-CM | POA: Diagnosis not present

## 2022-12-21 DIAGNOSIS — I5033 Acute on chronic diastolic (congestive) heart failure: Secondary | ICD-10-CM | POA: Diagnosis not present

## 2022-12-21 DIAGNOSIS — Z7984 Long term (current) use of oral hypoglycemic drugs: Secondary | ICD-10-CM | POA: Diagnosis not present

## 2022-12-21 DIAGNOSIS — Z9181 History of falling: Secondary | ICD-10-CM | POA: Diagnosis not present

## 2022-12-21 DIAGNOSIS — E1140 Type 2 diabetes mellitus with diabetic neuropathy, unspecified: Secondary | ICD-10-CM | POA: Diagnosis not present

## 2022-12-21 DIAGNOSIS — K219 Gastro-esophageal reflux disease without esophagitis: Secondary | ICD-10-CM | POA: Diagnosis not present

## 2022-12-21 DIAGNOSIS — I4892 Unspecified atrial flutter: Secondary | ICD-10-CM | POA: Diagnosis not present

## 2022-12-22 ENCOUNTER — Telehealth: Payer: Self-pay

## 2022-12-22 NOTE — Patient Outreach (Signed)
Care Coordination   12/22/2022 Name: Jessica [name] MRN: [id number] DOB: 07/11/1933   Care Coordination Outreach Attempts:  An unsuccessful telephone outreach was attempted for a scheduled appointment today.  Follow Up Plan:  Additional outreach attempts will be made to offer the patient care coordination information and services.   Encounter Outcome:  No Answer   Care Coordination Interventions:  No, not indicated    [name], RN, MSN, BSN, CCM Care Management Coordinator [phone]

## 2022-12-25 DIAGNOSIS — Z9181 History of falling: Secondary | ICD-10-CM | POA: Diagnosis not present

## 2022-12-25 DIAGNOSIS — Z792 Long term (current) use of antibiotics: Secondary | ICD-10-CM | POA: Diagnosis not present

## 2022-12-25 DIAGNOSIS — I110 Hypertensive heart disease with heart failure: Secondary | ICD-10-CM | POA: Diagnosis not present

## 2022-12-25 DIAGNOSIS — Z7984 Long term (current) use of oral hypoglycemic drugs: Secondary | ICD-10-CM | POA: Diagnosis not present

## 2022-12-25 DIAGNOSIS — I5033 Acute on chronic diastolic (congestive) heart failure: Secondary | ICD-10-CM | POA: Diagnosis not present

## 2022-12-25 DIAGNOSIS — Z7982 Long term (current) use of aspirin: Secondary | ICD-10-CM | POA: Diagnosis not present

## 2022-12-25 DIAGNOSIS — J449 Chronic obstructive pulmonary disease, unspecified: Secondary | ICD-10-CM | POA: Diagnosis not present

## 2022-12-25 DIAGNOSIS — K219 Gastro-esophageal reflux disease without esophagitis: Secondary | ICD-10-CM | POA: Diagnosis not present

## 2022-12-25 DIAGNOSIS — Z86711 Personal history of pulmonary embolism: Secondary | ICD-10-CM | POA: Diagnosis not present

## 2022-12-25 DIAGNOSIS — I358 Other nonrheumatic aortic valve disorders: Secondary | ICD-10-CM | POA: Diagnosis not present

## 2022-12-25 DIAGNOSIS — Z8616 Personal history of COVID-19: Secondary | ICD-10-CM | POA: Diagnosis not present

## 2022-12-25 DIAGNOSIS — E1140 Type 2 diabetes mellitus with diabetic neuropathy, unspecified: Secondary | ICD-10-CM | POA: Diagnosis not present

## 2022-12-25 DIAGNOSIS — E039 Hypothyroidism, unspecified: Secondary | ICD-10-CM | POA: Diagnosis not present

## 2022-12-25 DIAGNOSIS — I442 Atrioventricular block, complete: Secondary | ICD-10-CM | POA: Diagnosis not present

## 2022-12-25 DIAGNOSIS — L89891 Pressure ulcer of other site, stage 1: Secondary | ICD-10-CM | POA: Diagnosis not present

## 2022-12-25 DIAGNOSIS — I480 Paroxysmal atrial fibrillation: Secondary | ICD-10-CM | POA: Diagnosis not present

## 2022-12-25 DIAGNOSIS — I5022 Chronic systolic (congestive) heart failure: Secondary | ICD-10-CM | POA: Diagnosis not present

## 2022-12-25 DIAGNOSIS — M1612 Unilateral primary osteoarthritis, left hip: Secondary | ICD-10-CM | POA: Diagnosis not present

## 2022-12-25 DIAGNOSIS — I4892 Unspecified atrial flutter: Secondary | ICD-10-CM | POA: Diagnosis not present

## 2022-12-26 DIAGNOSIS — E1140 Type 2 diabetes mellitus with diabetic neuropathy, unspecified: Secondary | ICD-10-CM | POA: Diagnosis not present

## 2022-12-26 DIAGNOSIS — Z7984 Long term (current) use of oral hypoglycemic drugs: Secondary | ICD-10-CM | POA: Diagnosis not present

## 2022-12-26 DIAGNOSIS — K219 Gastro-esophageal reflux disease without esophagitis: Secondary | ICD-10-CM | POA: Diagnosis not present

## 2022-12-26 DIAGNOSIS — I5022 Chronic systolic (congestive) heart failure: Secondary | ICD-10-CM | POA: Diagnosis not present

## 2022-12-26 DIAGNOSIS — E039 Hypothyroidism, unspecified: Secondary | ICD-10-CM | POA: Diagnosis not present

## 2022-12-26 DIAGNOSIS — Z792 Long term (current) use of antibiotics: Secondary | ICD-10-CM | POA: Diagnosis not present

## 2022-12-26 DIAGNOSIS — I442 Atrioventricular block, complete: Secondary | ICD-10-CM | POA: Diagnosis not present

## 2022-12-26 DIAGNOSIS — Z7982 Long term (current) use of aspirin: Secondary | ICD-10-CM | POA: Diagnosis not present

## 2022-12-26 DIAGNOSIS — I358 Other nonrheumatic aortic valve disorders: Secondary | ICD-10-CM | POA: Diagnosis not present

## 2022-12-26 DIAGNOSIS — Z9181 History of falling: Secondary | ICD-10-CM | POA: Diagnosis not present

## 2022-12-26 DIAGNOSIS — M1612 Unilateral primary osteoarthritis, left hip: Secondary | ICD-10-CM | POA: Diagnosis not present

## 2022-12-26 DIAGNOSIS — I110 Hypertensive heart disease with heart failure: Secondary | ICD-10-CM | POA: Diagnosis not present

## 2022-12-26 DIAGNOSIS — J449 Chronic obstructive pulmonary disease, unspecified: Secondary | ICD-10-CM | POA: Diagnosis not present

## 2022-12-26 DIAGNOSIS — I4892 Unspecified atrial flutter: Secondary | ICD-10-CM | POA: Diagnosis not present

## 2022-12-26 DIAGNOSIS — I5033 Acute on chronic diastolic (congestive) heart failure: Secondary | ICD-10-CM | POA: Diagnosis not present

## 2022-12-26 DIAGNOSIS — L89891 Pressure ulcer of other site, stage 1: Secondary | ICD-10-CM | POA: Diagnosis not present

## 2022-12-26 DIAGNOSIS — I480 Paroxysmal atrial fibrillation: Secondary | ICD-10-CM | POA: Diagnosis not present

## 2022-12-26 DIAGNOSIS — Z8616 Personal history of COVID-19: Secondary | ICD-10-CM | POA: Diagnosis not present

## 2022-12-26 DIAGNOSIS — Z86711 Personal history of pulmonary embolism: Secondary | ICD-10-CM | POA: Diagnosis not present

## 2022-12-27 ENCOUNTER — Telehealth: Payer: Self-pay | Admitting: Internal Medicine

## 2022-12-27 DIAGNOSIS — Z9181 History of falling: Secondary | ICD-10-CM | POA: Diagnosis not present

## 2022-12-27 DIAGNOSIS — I5022 Chronic systolic (congestive) heart failure: Secondary | ICD-10-CM | POA: Diagnosis not present

## 2022-12-27 DIAGNOSIS — I110 Hypertensive heart disease with heart failure: Secondary | ICD-10-CM | POA: Diagnosis not present

## 2022-12-27 DIAGNOSIS — K219 Gastro-esophageal reflux disease without esophagitis: Secondary | ICD-10-CM | POA: Diagnosis not present

## 2022-12-27 DIAGNOSIS — Z792 Long term (current) use of antibiotics: Secondary | ICD-10-CM | POA: Diagnosis not present

## 2022-12-27 DIAGNOSIS — I480 Paroxysmal atrial fibrillation: Secondary | ICD-10-CM | POA: Diagnosis not present

## 2022-12-27 DIAGNOSIS — Z8616 Personal history of COVID-19: Secondary | ICD-10-CM | POA: Diagnosis not present

## 2022-12-27 DIAGNOSIS — L89891 Pressure ulcer of other site, stage 1: Secondary | ICD-10-CM | POA: Diagnosis not present

## 2022-12-27 DIAGNOSIS — I5033 Acute on chronic diastolic (congestive) heart failure: Secondary | ICD-10-CM | POA: Diagnosis not present

## 2022-12-27 DIAGNOSIS — J449 Chronic obstructive pulmonary disease, unspecified: Secondary | ICD-10-CM | POA: Diagnosis not present

## 2022-12-27 DIAGNOSIS — M1612 Unilateral primary osteoarthritis, left hip: Secondary | ICD-10-CM | POA: Diagnosis not present

## 2022-12-27 DIAGNOSIS — Z7982 Long term (current) use of aspirin: Secondary | ICD-10-CM | POA: Diagnosis not present

## 2022-12-27 DIAGNOSIS — I4892 Unspecified atrial flutter: Secondary | ICD-10-CM | POA: Diagnosis not present

## 2022-12-27 DIAGNOSIS — Z7984 Long term (current) use of oral hypoglycemic drugs: Secondary | ICD-10-CM | POA: Diagnosis not present

## 2022-12-27 DIAGNOSIS — E1140 Type 2 diabetes mellitus with diabetic neuropathy, unspecified: Secondary | ICD-10-CM | POA: Diagnosis not present

## 2022-12-27 DIAGNOSIS — Z86711 Personal history of pulmonary embolism: Secondary | ICD-10-CM | POA: Diagnosis not present

## 2022-12-27 DIAGNOSIS — E039 Hypothyroidism, unspecified: Secondary | ICD-10-CM | POA: Diagnosis not present

## 2022-12-27 DIAGNOSIS — I358 Other nonrheumatic aortic valve disorders: Secondary | ICD-10-CM | POA: Diagnosis not present

## 2022-12-27 DIAGNOSIS — I442 Atrioventricular block, complete: Secondary | ICD-10-CM | POA: Diagnosis not present

## 2022-12-27 NOTE — Telephone Encounter (Signed)
Cary called from [name]  PT for 1 week for 8 weeks  [phone]

## 2022-12-27 NOTE — Telephone Encounter (Signed)
[facility]? OK w/me Thx

## 2022-12-28 NOTE — Telephone Encounter (Signed)
LVM for Cary with verbal Okay for [facility] PT for 1x a week for 8 weeks.

## 2022-12-29 ENCOUNTER — Ambulatory Visit: Payer: Medicare Other | Admitting: Internal Medicine

## 2023-01-01 DIAGNOSIS — Z7982 Long term (current) use of aspirin: Secondary | ICD-10-CM | POA: Diagnosis not present

## 2023-01-01 DIAGNOSIS — E1140 Type 2 diabetes mellitus with diabetic neuropathy, unspecified: Secondary | ICD-10-CM | POA: Diagnosis not present

## 2023-01-01 DIAGNOSIS — I872 Venous insufficiency (chronic) (peripheral): Secondary | ICD-10-CM | POA: Diagnosis not present

## 2023-01-01 DIAGNOSIS — E1151 Type 2 diabetes mellitus with diabetic peripheral angiopathy without gangrene: Secondary | ICD-10-CM | POA: Diagnosis not present

## 2023-01-01 DIAGNOSIS — I110 Hypertensive heart disease with heart failure: Secondary | ICD-10-CM | POA: Diagnosis not present

## 2023-01-01 DIAGNOSIS — I358 Other nonrheumatic aortic valve disorders: Secondary | ICD-10-CM | POA: Diagnosis not present

## 2023-01-01 DIAGNOSIS — I442 Atrioventricular block, complete: Secondary | ICD-10-CM | POA: Diagnosis not present

## 2023-01-01 DIAGNOSIS — K219 Gastro-esophageal reflux disease without esophagitis: Secondary | ICD-10-CM | POA: Diagnosis not present

## 2023-01-01 DIAGNOSIS — Z9181 History of falling: Secondary | ICD-10-CM | POA: Diagnosis not present

## 2023-01-01 DIAGNOSIS — I4892 Unspecified atrial flutter: Secondary | ICD-10-CM | POA: Diagnosis not present

## 2023-01-01 DIAGNOSIS — Z950 Presence of cardiac pacemaker: Secondary | ICD-10-CM | POA: Diagnosis not present

## 2023-01-01 DIAGNOSIS — M1612 Unilateral primary osteoarthritis, left hip: Secondary | ICD-10-CM | POA: Diagnosis not present

## 2023-01-01 DIAGNOSIS — I5033 Acute on chronic diastolic (congestive) heart failure: Secondary | ICD-10-CM | POA: Diagnosis not present

## 2023-01-01 DIAGNOSIS — I480 Paroxysmal atrial fibrillation: Secondary | ICD-10-CM | POA: Diagnosis not present

## 2023-01-01 DIAGNOSIS — I5022 Chronic systolic (congestive) heart failure: Secondary | ICD-10-CM | POA: Diagnosis not present

## 2023-01-01 DIAGNOSIS — Z86711 Personal history of pulmonary embolism: Secondary | ICD-10-CM | POA: Diagnosis not present

## 2023-01-01 DIAGNOSIS — Z792 Long term (current) use of antibiotics: Secondary | ICD-10-CM | POA: Diagnosis not present

## 2023-01-01 DIAGNOSIS — Z8616 Personal history of COVID-19: Secondary | ICD-10-CM | POA: Diagnosis not present

## 2023-01-01 DIAGNOSIS — E039 Hypothyroidism, unspecified: Secondary | ICD-10-CM | POA: Diagnosis not present

## 2023-01-02 DIAGNOSIS — I4892 Unspecified atrial flutter: Secondary | ICD-10-CM | POA: Diagnosis not present

## 2023-01-02 DIAGNOSIS — E1151 Type 2 diabetes mellitus with diabetic peripheral angiopathy without gangrene: Secondary | ICD-10-CM | POA: Diagnosis not present

## 2023-01-02 DIAGNOSIS — Z7982 Long term (current) use of aspirin: Secondary | ICD-10-CM | POA: Diagnosis not present

## 2023-01-02 DIAGNOSIS — Z86711 Personal history of pulmonary embolism: Secondary | ICD-10-CM | POA: Diagnosis not present

## 2023-01-02 DIAGNOSIS — I5022 Chronic systolic (congestive) heart failure: Secondary | ICD-10-CM | POA: Diagnosis not present

## 2023-01-02 DIAGNOSIS — Z792 Long term (current) use of antibiotics: Secondary | ICD-10-CM | POA: Diagnosis not present

## 2023-01-02 DIAGNOSIS — I110 Hypertensive heart disease with heart failure: Secondary | ICD-10-CM | POA: Diagnosis not present

## 2023-01-02 DIAGNOSIS — I480 Paroxysmal atrial fibrillation: Secondary | ICD-10-CM | POA: Diagnosis not present

## 2023-01-02 DIAGNOSIS — I5033 Acute on chronic diastolic (congestive) heart failure: Secondary | ICD-10-CM | POA: Diagnosis not present

## 2023-01-02 DIAGNOSIS — Z9181 History of falling: Secondary | ICD-10-CM | POA: Diagnosis not present

## 2023-01-02 DIAGNOSIS — E1140 Type 2 diabetes mellitus with diabetic neuropathy, unspecified: Secondary | ICD-10-CM | POA: Diagnosis not present

## 2023-01-02 DIAGNOSIS — I442 Atrioventricular block, complete: Secondary | ICD-10-CM | POA: Diagnosis not present

## 2023-01-02 DIAGNOSIS — Z8616 Personal history of COVID-19: Secondary | ICD-10-CM | POA: Diagnosis not present

## 2023-01-02 DIAGNOSIS — I872 Venous insufficiency (chronic) (peripheral): Secondary | ICD-10-CM | POA: Diagnosis not present

## 2023-01-02 DIAGNOSIS — I358 Other nonrheumatic aortic valve disorders: Secondary | ICD-10-CM | POA: Diagnosis not present

## 2023-01-02 DIAGNOSIS — Z950 Presence of cardiac pacemaker: Secondary | ICD-10-CM | POA: Diagnosis not present

## 2023-01-02 DIAGNOSIS — E039 Hypothyroidism, unspecified: Secondary | ICD-10-CM | POA: Diagnosis not present

## 2023-01-02 DIAGNOSIS — K219 Gastro-esophageal reflux disease without esophagitis: Secondary | ICD-10-CM | POA: Diagnosis not present

## 2023-01-02 DIAGNOSIS — M1612 Unilateral primary osteoarthritis, left hip: Secondary | ICD-10-CM | POA: Diagnosis not present

## 2023-01-05 ENCOUNTER — Ambulatory Visit (HOSPITAL_BASED_OUTPATIENT_CLINIC_OR_DEPARTMENT_OTHER): Payer: Medicare Other

## 2023-01-05 ENCOUNTER — Ambulatory Visit (HOSPITAL_BASED_OUTPATIENT_CLINIC_OR_DEPARTMENT_OTHER): Payer: Medicare Other | Admitting: Student

## 2023-01-05 ENCOUNTER — Encounter (HOSPITAL_BASED_OUTPATIENT_CLINIC_OR_DEPARTMENT_OTHER): Payer: Self-pay | Admitting: Student

## 2023-01-05 DIAGNOSIS — Z792 Long term (current) use of antibiotics: Secondary | ICD-10-CM | POA: Diagnosis not present

## 2023-01-05 DIAGNOSIS — Z9181 History of falling: Secondary | ICD-10-CM | POA: Diagnosis not present

## 2023-01-05 DIAGNOSIS — Z8616 Personal history of COVID-19: Secondary | ICD-10-CM | POA: Diagnosis not present

## 2023-01-05 DIAGNOSIS — I442 Atrioventricular block, complete: Secondary | ICD-10-CM | POA: Diagnosis not present

## 2023-01-05 DIAGNOSIS — M25562 Pain in left knee: Secondary | ICD-10-CM | POA: Diagnosis not present

## 2023-01-05 DIAGNOSIS — I5033 Acute on chronic diastolic (congestive) heart failure: Secondary | ICD-10-CM | POA: Diagnosis not present

## 2023-01-05 DIAGNOSIS — M25572 Pain in left ankle and joints of left foot: Secondary | ICD-10-CM | POA: Diagnosis not present

## 2023-01-05 DIAGNOSIS — G8929 Other chronic pain: Secondary | ICD-10-CM

## 2023-01-05 DIAGNOSIS — I110 Hypertensive heart disease with heart failure: Secondary | ICD-10-CM | POA: Diagnosis not present

## 2023-01-05 DIAGNOSIS — I5022 Chronic systolic (congestive) heart failure: Secondary | ICD-10-CM | POA: Diagnosis not present

## 2023-01-05 DIAGNOSIS — M19072 Primary osteoarthritis, left ankle and foot: Secondary | ICD-10-CM | POA: Diagnosis not present

## 2023-01-05 DIAGNOSIS — Z86711 Personal history of pulmonary embolism: Secondary | ICD-10-CM | POA: Diagnosis not present

## 2023-01-05 DIAGNOSIS — I480 Paroxysmal atrial fibrillation: Secondary | ICD-10-CM | POA: Diagnosis not present

## 2023-01-05 DIAGNOSIS — Z950 Presence of cardiac pacemaker: Secondary | ICD-10-CM | POA: Diagnosis not present

## 2023-01-05 DIAGNOSIS — I358 Other nonrheumatic aortic valve disorders: Secondary | ICD-10-CM | POA: Diagnosis not present

## 2023-01-05 DIAGNOSIS — Z7982 Long term (current) use of aspirin: Secondary | ICD-10-CM | POA: Diagnosis not present

## 2023-01-05 DIAGNOSIS — M1612 Unilateral primary osteoarthritis, left hip: Secondary | ICD-10-CM | POA: Diagnosis not present

## 2023-01-05 DIAGNOSIS — I4892 Unspecified atrial flutter: Secondary | ICD-10-CM | POA: Diagnosis not present

## 2023-01-05 DIAGNOSIS — E1140 Type 2 diabetes mellitus with diabetic neuropathy, unspecified: Secondary | ICD-10-CM | POA: Diagnosis not present

## 2023-01-05 DIAGNOSIS — E1151 Type 2 diabetes mellitus with diabetic peripheral angiopathy without gangrene: Secondary | ICD-10-CM | POA: Diagnosis not present

## 2023-01-05 DIAGNOSIS — M1712 Unilateral primary osteoarthritis, left knee: Secondary | ICD-10-CM

## 2023-01-05 DIAGNOSIS — I872 Venous insufficiency (chronic) (peripheral): Secondary | ICD-10-CM | POA: Diagnosis not present

## 2023-01-05 DIAGNOSIS — K219 Gastro-esophageal reflux disease without esophagitis: Secondary | ICD-10-CM | POA: Diagnosis not present

## 2023-01-05 DIAGNOSIS — E039 Hypothyroidism, unspecified: Secondary | ICD-10-CM | POA: Diagnosis not present
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
## 2023-01-06 DIAGNOSIS — Z950 Presence of cardiac pacemaker: Secondary | ICD-10-CM | POA: Diagnosis not present

## 2023-01-06 DIAGNOSIS — I872 Venous insufficiency (chronic) (peripheral): Secondary | ICD-10-CM | POA: Diagnosis not present

## 2023-01-06 DIAGNOSIS — I110 Hypertensive heart disease with heart failure: Secondary | ICD-10-CM | POA: Diagnosis not present

## 2023-01-06 DIAGNOSIS — K219 Gastro-esophageal reflux disease without esophagitis: Secondary | ICD-10-CM | POA: Diagnosis not present

## 2023-01-06 DIAGNOSIS — Z86711 Personal history of pulmonary embolism: Secondary | ICD-10-CM | POA: Diagnosis not present

## 2023-01-06 DIAGNOSIS — Z792 Long term (current) use of antibiotics: Secondary | ICD-10-CM | POA: Diagnosis not present

## 2023-01-06 DIAGNOSIS — E1151 Type 2 diabetes mellitus with diabetic peripheral angiopathy without gangrene: Secondary | ICD-10-CM | POA: Diagnosis not present

## 2023-01-06 DIAGNOSIS — I480 Paroxysmal atrial fibrillation: Secondary | ICD-10-CM | POA: Diagnosis not present

## 2023-01-06 DIAGNOSIS — I442 Atrioventricular block, complete: Secondary | ICD-10-CM | POA: Diagnosis not present

## 2023-01-06 DIAGNOSIS — I5022 Chronic systolic (congestive) heart failure: Secondary | ICD-10-CM | POA: Diagnosis not present

## 2023-01-06 DIAGNOSIS — Z9181 History of falling: Secondary | ICD-10-CM | POA: Diagnosis not present

## 2023-01-06 DIAGNOSIS — I5033 Acute on chronic diastolic (congestive) heart failure: Secondary | ICD-10-CM | POA: Diagnosis not present

## 2023-01-06 DIAGNOSIS — E039 Hypothyroidism, unspecified: Secondary | ICD-10-CM | POA: Diagnosis not present

## 2023-01-06 DIAGNOSIS — Z7982 Long term (current) use of aspirin: Secondary | ICD-10-CM | POA: Diagnosis not present

## 2023-01-06 DIAGNOSIS — M1612 Unilateral primary osteoarthritis, left hip: Secondary | ICD-10-CM | POA: Diagnosis not present

## 2023-01-06 DIAGNOSIS — I4892 Unspecified atrial flutter: Secondary | ICD-10-CM | POA: Diagnosis not present

## 2023-01-06 DIAGNOSIS — I358 Other nonrheumatic aortic valve disorders: Secondary | ICD-10-CM | POA: Diagnosis not present

## 2023-01-06 DIAGNOSIS — Z8616 Personal history of COVID-19: Secondary | ICD-10-CM | POA: Diagnosis not present

## 2023-01-06 DIAGNOSIS — E1140 Type 2 diabetes mellitus with diabetic neuropathy, unspecified: Secondary | ICD-10-CM | POA: Diagnosis not present

## 2023-01-08 ENCOUNTER — Telehealth (HOSPITAL_BASED_OUTPATIENT_CLINIC_OR_DEPARTMENT_OTHER): Payer: Self-pay

## 2023-01-08 ENCOUNTER — Telehealth: Payer: Self-pay | Admitting: Internal Medicine

## 2023-01-08 DIAGNOSIS — Z950 Presence of cardiac pacemaker: Secondary | ICD-10-CM | POA: Diagnosis not present

## 2023-01-08 DIAGNOSIS — I480 Paroxysmal atrial fibrillation: Secondary | ICD-10-CM | POA: Diagnosis not present

## 2023-01-08 DIAGNOSIS — I442 Atrioventricular block, complete: Secondary | ICD-10-CM | POA: Diagnosis not present

## 2023-01-08 DIAGNOSIS — E1140 Type 2 diabetes mellitus with diabetic neuropathy, unspecified: Secondary | ICD-10-CM | POA: Diagnosis not present

## 2023-01-08 DIAGNOSIS — M1612 Unilateral primary osteoarthritis, left hip: Secondary | ICD-10-CM | POA: Diagnosis not present

## 2023-01-08 DIAGNOSIS — Z86711 Personal history of pulmonary embolism: Secondary | ICD-10-CM | POA: Diagnosis not present

## 2023-01-08 DIAGNOSIS — Z7982 Long term (current) use of aspirin: Secondary | ICD-10-CM | POA: Diagnosis not present

## 2023-01-08 DIAGNOSIS — K219 Gastro-esophageal reflux disease without esophagitis: Secondary | ICD-10-CM | POA: Diagnosis not present

## 2023-01-08 DIAGNOSIS — I5022 Chronic systolic (congestive) heart failure: Secondary | ICD-10-CM | POA: Diagnosis not present

## 2023-01-08 DIAGNOSIS — Z8616 Personal history of COVID-19: Secondary | ICD-10-CM | POA: Diagnosis not present

## 2023-01-08 DIAGNOSIS — I4892 Unspecified atrial flutter: Secondary | ICD-10-CM | POA: Diagnosis not present

## 2023-01-08 DIAGNOSIS — I358 Other nonrheumatic aortic valve disorders: Secondary | ICD-10-CM | POA: Diagnosis not present

## 2023-01-08 DIAGNOSIS — I110 Hypertensive heart disease with heart failure: Secondary | ICD-10-CM | POA: Diagnosis not present

## 2023-01-08 DIAGNOSIS — I872 Venous insufficiency (chronic) (peripheral): Secondary | ICD-10-CM | POA: Diagnosis not present

## 2023-01-08 DIAGNOSIS — Z9181 History of falling: Secondary | ICD-10-CM | POA: Diagnosis not present

## 2023-01-08 DIAGNOSIS — E039 Hypothyroidism, unspecified: Secondary | ICD-10-CM | POA: Diagnosis not present

## 2023-01-08 DIAGNOSIS — Z792 Long term (current) use of antibiotics: Secondary | ICD-10-CM | POA: Diagnosis not present

## 2023-01-08 DIAGNOSIS — I5033 Acute on chronic diastolic (congestive) heart failure: Secondary | ICD-10-CM | POA: Diagnosis not present

## 2023-01-08 DIAGNOSIS — E1151 Type 2 diabetes mellitus with diabetic peripheral angiopathy without gangrene: Secondary | ICD-10-CM | POA: Diagnosis not present

## 2023-01-08 NOTE — Telephone Encounter (Signed)
Lab results printed. Please review and call pt

## 2023-01-08 NOTE — Telephone Encounter (Signed)
Called and discussed lab results with pt.  She says her ankle remains swollen, not significantly changed since Friday.

## 2023-01-08 NOTE — Telephone Encounter (Signed)
Sherrilyn Rist from [facility] [phone]  Called to report patient's heart rate - between 105-120 resting.  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  Patients weight today is 230.

## 2023-01-10 ENCOUNTER — Ambulatory Visit: Payer: Self-pay

## 2023-01-10 NOTE — Patient Instructions (Addendum)
Visit Information  Thank you for taking time to visit with me today. Please don't hesitate to contact me if I can be of assistance to you.   Following are the goals we discussed today:  Continue to take medications as prescribed. Continue to attend provider visits as scheduled Continue to eat healthy, lean meats, vegetables, fruits, avoid saturated and transfats Continue to work with therapist as recommended   Our next appointment is by telephone on 02/13/23 at 11:00 am  Please call the care guide team at [phone] if you need to cancel or reschedule your appointment.   If you are experiencing a Mental Health or Behavioral Health Crisis or need someone to talk to, please call the Suicide and Crisis Lifeline: 988 call the [place] National Suicide Prevention Lifeline: (814)158-6029 or TTY: 438-623-5774 TTY (470) 178-1766) to talk to a trained counselor call 1-800-273-TALK (toll free, 24 hour hotline)  [name], RN, MSN, BSN, CCM Care Management Coordinator [phone]

## 2023-01-10 NOTE — Patient Outreach (Signed)
Care Coordination   Follow Up Visit Note   01/10/2023 Name: Jessica [name] MRN: [id number] DOB: 05/24/1933  Jessica [name] is a 89 y.o. year old female who sees Plotnikov, Georgina Quint, MD for primary care. I spoke with  Jessica [name] by phone today.  What matters to the patients health and wellness today?  Patient reports ankle painful/swollen-has an appointment with orthopedic provider tomorrow. She reports she is still active with home health therapy and has not had a fall in over a month. Continues to live alone. She states her gransdon, great granddaughter and niece are supportive and assist her in taking her places to appointments and getting what she needs from the store. She reports her neighbor comes to spend time with her. She denies needs for increased socialization. Declines resources regarding higher level of care. She has medications and reports she is taking her medications. Per review of chart both Metoprolol succinate 50 mg and Metoprlol Tartrate 25mg  bid are on her medication list. Patient states she has and is taking Metoprolol succinate 50mg  once a day.   Goals Addressed             This Visit's Progress    Care Coordination-assist with health management       Interventions Today    Flowsheet Row Most Recent Value  Chronic Disease   Chronic disease during today's visit Diabetes, Chronic Obstructive Pulmonary Disease (COPD), Other, Atrial Fibrillation (AFib)  [limited mobility]  General Interventions   General Interventions Discussed/Reviewed General Interventions Reviewed, Doctor Visits, Community Resources, Level of Care  [Evaluation of current treatment plan for health condition and patient's adherence to plan. assess socialization resource needs,  level of care discussed-does not want to explore higher level of care at this time.]  Doctor Visits Discussed/Reviewed Doctor Visits Reviewed, PCP, Specialist  PCP/Specialist Visits Compliance with follow-up visit   [reviewed upcoming scheduled appointments]  Exercise Interventions   Exercise Discussed/Reviewed Exercise Discussed  [confirmed still active with [facility] for therapy]  Education Interventions   Education Provided Provided Education  Provided Verbal Education On Other, Blood Sugar Monitoring, When to see the doctor, Medication, Exercise  [advised to continue to take medications as prescribed, attend provider visits as scheduled, discussed/encouraged checking blood sugar, advsied to continue to contact providers with health questions or concerns.]  Pharmacy Interventions   Pharmacy Dicussed/Reviewed Pharmacy Topics Reviewed  Safety Interventions   Safety Discussed/Reviewed Fall Risk, Safety Reviewed            SDOH assessments and interventions completed:  No  Care Coordination Interventions:  Yes, provided   Follow up plan: Follow up call scheduled for 02/13/23    Encounter Outcome:  Patient Visit Completed   [name], RN, MSN, BSN, CCM Care Management Coordinator [phone]

## 2023-01-10 NOTE — Telephone Encounter (Signed)
Noted! Thank you

## 2023-01-11 ENCOUNTER — Ambulatory Visit: Payer: Medicare Other | Admitting: Orthopedic Surgery

## 2023-01-11 DIAGNOSIS — M1712 Unilateral primary osteoarthritis, left knee: Secondary | ICD-10-CM

## 2023-01-11 DIAGNOSIS — I480 Paroxysmal atrial fibrillation: Secondary | ICD-10-CM | POA: Diagnosis not present

## 2023-01-11 DIAGNOSIS — E039 Hypothyroidism, unspecified: Secondary | ICD-10-CM | POA: Diagnosis not present

## 2023-01-11 DIAGNOSIS — Z86711 Personal history of pulmonary embolism: Secondary | ICD-10-CM | POA: Diagnosis not present

## 2023-01-11 DIAGNOSIS — K219 Gastro-esophageal reflux disease without esophagitis: Secondary | ICD-10-CM | POA: Diagnosis not present

## 2023-01-11 DIAGNOSIS — I4892 Unspecified atrial flutter: Secondary | ICD-10-CM | POA: Diagnosis not present

## 2023-01-11 DIAGNOSIS — Z792 Long term (current) use of antibiotics: Secondary | ICD-10-CM | POA: Diagnosis not present

## 2023-01-11 DIAGNOSIS — I5022 Chronic systolic (congestive) heart failure: Secondary | ICD-10-CM | POA: Diagnosis not present

## 2023-01-11 DIAGNOSIS — I442 Atrioventricular block, complete: Secondary | ICD-10-CM | POA: Diagnosis not present

## 2023-01-11 DIAGNOSIS — I872 Venous insufficiency (chronic) (peripheral): Secondary | ICD-10-CM | POA: Diagnosis not present

## 2023-01-11 DIAGNOSIS — M25572 Pain in left ankle and joints of left foot: Secondary | ICD-10-CM

## 2023-01-11 DIAGNOSIS — Z9181 History of falling: Secondary | ICD-10-CM | POA: Diagnosis not present

## 2023-01-11 DIAGNOSIS — E1140 Type 2 diabetes mellitus with diabetic neuropathy, unspecified: Secondary | ICD-10-CM | POA: Diagnosis not present

## 2023-01-11 DIAGNOSIS — Z950 Presence of cardiac pacemaker: Secondary | ICD-10-CM | POA: Diagnosis not present

## 2023-01-11 DIAGNOSIS — I358 Other nonrheumatic aortic valve disorders: Secondary | ICD-10-CM | POA: Diagnosis not present

## 2023-01-11 DIAGNOSIS — E1151 Type 2 diabetes mellitus with diabetic peripheral angiopathy without gangrene: Secondary | ICD-10-CM | POA: Diagnosis not present

## 2023-01-11 DIAGNOSIS — Z8616 Personal history of COVID-19: Secondary | ICD-10-CM | POA: Diagnosis not present

## 2023-01-11 DIAGNOSIS — I5033 Acute on chronic diastolic (congestive) heart failure: Secondary | ICD-10-CM | POA: Diagnosis not present

## 2023-01-11 DIAGNOSIS — M1612 Unilateral primary osteoarthritis, left hip: Secondary | ICD-10-CM | POA: Diagnosis not present

## 2023-01-11 DIAGNOSIS — I110 Hypertensive heart disease with heart failure: Secondary | ICD-10-CM | POA: Diagnosis not present

## 2023-01-11 DIAGNOSIS — Z7982 Long term (current) use of aspirin: Secondary | ICD-10-CM | POA: Diagnosis not present

## 2023-01-12 ENCOUNTER — Encounter: Payer: Self-pay | Admitting: Internal Medicine

## 2023-01-12 ENCOUNTER — Ambulatory Visit: Payer: Medicare Other | Admitting: Internal Medicine

## 2023-01-12 ENCOUNTER — Other Ambulatory Visit: Payer: Self-pay

## 2023-01-12 VITALS — BP 128/74 | HR 64 | Temp 96.1°F

## 2023-01-12 DIAGNOSIS — R7881 Bacteremia: Secondary | ICD-10-CM

## 2023-01-12 DIAGNOSIS — Z792 Long term (current) use of antibiotics: Secondary | ICD-10-CM

## 2023-01-12 DIAGNOSIS — B9561 Methicillin susceptible Staphylococcus aureus infection as the cause of diseases classified elsewhere: Secondary | ICD-10-CM | POA: Diagnosis not present

## 2023-01-12 DIAGNOSIS — T827XXD Infection and inflammatory reaction due to other cardiac and vascular devices, implants and grafts, subsequent encounter: Secondary | ICD-10-CM

## 2023-01-12 DIAGNOSIS — Z952 Presence of prosthetic heart valve: Secondary | ICD-10-CM

## 2023-01-12 MED ORDER — CEFADROXIL 500 MG PO CAPS: 500.0000 mg | ORAL_CAPSULE | Freq: Two times a day (BID) | ORAL | 11 refills | Status: AC

## 2023-01-12 NOTE — Progress Notes (Signed)
Patient ID: Jessica [name], female   DOB: 10-Aug-1933, 89 y.o.   MRN: [id number]  HPI Jessica [name] is a 89 y.o. female here for hospital f/u tavr/pacer infection and mssa bacteremia    01/12/23 id clinic f/u Here with her neighbor Weak overall but improving slowly Come in on wheel chair from parking lot No n/v/diarrhea    Otherwise her health is steadily improving. Outpatient Encounter Medications as of 01/12/2023  Medication Sig   albuterol (VENTOLIN HFA) 108 (90 Base) MCG/ACT inhaler Inhale 2 puffs into the lungs every 6 (six) hours as needed for wheezing or shortness of breath.   allopurinol (ZYLOPRIM) 100 MG tablet TAKE 1 TABLET BY MOUTH DAILY (Patient taking differently: Take 100 mg by mouth daily.)   aspirin EC 81 MG tablet Take 162 mg by mouth daily. Swallow whole.   b complex vitamins tablet Take 1 tablet by mouth daily.   cefadroxil (DURICEF) 500 MG capsule Take 1 capsule (500 mg total) by mouth 2 (two) times daily.   Cholecalciferol 1000 UNITS tablet Take 1,000 Units by mouth daily.   CINNAMON PO Take 1 capsule by mouth daily.   empagliflozin (JARDIANCE) 10 MG TABS tablet Take 1 tablet (10 mg total) by mouth daily.   ezetimibe (ZETIA) 10 MG tablet Take 10 mg by mouth daily.   furosemide (LASIX) 40 MG tablet Take 40mg  tablet daily with an extra tablet in the afternoon for swelling, weight gain of 2 pounds overnight or 5 pounds in one week!   levothyroxine (SYNTHROID) 75 MCG tablet TAKE 1 TABLET BY MOUTH DAILY (Patient taking differently: Take by mouth daily before breakfast.)   LORazepam (ATIVAN) 0.5 MG tablet TAKE 1 TABLET BY MOUTH TWICE  DAILY AS NEEDED FOR ANXIETY   metFORMIN (GLUCOPHAGE) 500 MG tablet TAKE 1 TABLET BY MOUTH TWICE  DAILY WITH MEALS   metoprolol succinate (TOPROL-XL) 50 MG 24 hr tablet TAKE 1 TABLET BY MOUTH DAILY  WITH OR IMMEDIATELY FOLLOWING A  MEAL   Multiple Vitamin (MULTIVITAMIN) tablet Take 1 tablet by mouth daily.   nystatin powder Apply 1  Application topically daily. Apply to under breast topically every day shift for redness/moisture.   potassium chloride SA (KLOR-CON M) 20 MEQ tablet Take 20 mEq by mouth daily.   repaglinide (PRANDIN) 2 MG tablet TAKE 2 TABLETS BY MOUTH 3 TIMES  DAILY BEFORE MEALS   rosuvastatin (CRESTOR) 10 MG tablet TAKE 1 TABLET BY MOUTH UP TO 3  TIMES WEEKLY AS TOLERATED   sacubitril-valsartan (ENTRESTO) 97-103 MG Take 1 tablet by mouth 2 (two) times daily.   spironolactone (ALDACTONE) 25 MG tablet Take 0.5 tablets (12.5 mg total) by mouth daily.   temazepam (RESTORIL) 15 MG capsule TAKE 1 CAPSULE BY MOUTH EVERY NIGHT AT BEDTIME AS NEEDED   venlafaxine XR (EFFEXOR-XR) 75 MG 24 hr capsule TAKE 1 CAPSULE BY MOUTH DAILY  WITH BREAKFAST   [DISCONTINUED] metoprolol tartrate (LOPRESSOR) 25 MG tablet Take 1 tablet (25 mg total) by mouth 2 (two) times daily. (Patient not taking: Reported on 01/12/2023)   No facility-administered encounter medications on file as of 01/12/2023.     Patient Active Problem List   Diagnosis Date Noted   UTI (urinary tract infection) 11/20/2022   Hair loss 08/14/2022   Hypokalemia 05/15/2022   Acute on chronic diastolic CHF (congestive heart failure) (HCC) 05/14/2022   ABLA (acute blood loss anemia) 04/20/2022   Elevated INR 04/20/2022   Prolonged QT interval 04/20/2022   Delirium 04/19/2022   History  of bacteremia 04/18/2022   COVID-19 virus infection 04/18/2022   COPD with acute exacerbation (HCC) 04/18/2022   Dementia with behavioral disturbance (HCC) 04/18/2022   MSSA bacteremia 04/04/2022   Prosthetic valve endocarditis (HCC) 04/04/2022   Pacemaker infection (HCC) 04/04/2022   Sepsis (HCC) 03/28/2022   Altered mental status 03/28/2022   Hypovolemia 03/28/2022   AKI (acute kidney injury) (HCC) 03/28/2022   Severe sepsis with acute organ dysfunction (HCC) 03/28/2022   DNR (do not resuscitate) 03/28/2022   Hypotension 03/28/2022   Rectal bleeding    Pure  hypercholesterolemia 01/28/2021   Fall 12/22/2020   Knee contusion 12/22/2020   Eczema 09/09/2019   Vertigo 03/05/2019   S/P TAVR (transcatheter aortic valve replacement) 01/14/2019   Presence of permanent cardiac pacemaker    Pulmonary nodule    Severe aortic stenosis    Atrial fibrillation and flutter (HCC) 07/05/2017   DOE (dyspnea on exertion) 04/18/2017   Chronic venous insufficiency 12/08/2016   Erythema nodosum 11/10/2016   Paronychia of finger, left 04/13/2016   Left carotid artery stenosis 09/24/2014   Diarrhea 07/12/2013   Foot pain, left 03/18/2013   Gastrocnemius strain 03/18/2013   GERD (gastroesophageal reflux disease) 08/18/2011   History of pulmonary embolism 10/2008   DUODENITIS, WITHOUT HEMORRHAGE 06/29/2008   Diverticulosis of colon without hemorrhage 06/29/2008   DM type 2, controlled, with complication (HCC) 01/20/2008   Low back pain 10/15/2007   OBESITY, MORBID 07/16/2007   Grieving 05/15/2007   HLD (hyperlipidemia) 03/03/2007   Hypothyroidism 11/06/2006   Anxiety disorder 11/06/2006   CARPAL TUNNEL SYNDROME 11/06/2006   Essential hypertension 11/06/2006   HIATAL HERNIA 11/06/2006   Osteoarthritis 11/06/2006   History of endocarditis 11/06/2006     Health Maintenance Due  Topic Date Due   OPHTHALMOLOGY EXAM  Never done   FOOT EXAM  12/22/2018   COVID-19 Vaccine (3 - Pfizer risk series) 07/29/2019   INFLUENZA VACCINE  11/02/2022     Review of Systems All other ros negative  Physical Exam   BP 128/74   Pulse 64   Temp (!) 96.1 F (35.6 C) (Temporal)   Physical Exam  General/constitutional: no distress, pleasant HEENT: Normocephalic, PER, Conj Clear, EOMI, Oropharynx clear Neck supple CV: rrr no mrg - left chest pacer pocket site no tenderness Lungs: clear to auscultation, normal respiratory effort Abd: Soft, Nontender Ext: no edema Skin: No Rash Neuro: nonfocal MSK: no peripheral joint swelling/tenderness/warmth; back spines  nontender     CBC Lab Results  Component Value Date   WBC 10.2 11/20/2022   RBC 4.06 11/20/2022   HGB 12.8 11/20/2022   HCT 38.0 11/20/2022   PLT 223.0 11/20/2022   MCV 93.5 11/20/2022   MCH 30.2 07/14/2022   MCHC 33.7 11/20/2022   RDW 14.4 11/20/2022   LYMPHSABS 3.2 11/20/2022   MONOABS 1.9 (H) 11/20/2022   EOSABS 0.1 11/20/2022    BMET Lab Results  Component Value Date   NA 136 11/20/2022   K 4.7 11/20/2022   CL 104 11/20/2022   CO2 22 11/20/2022   GLUCOSE 162 (H) 11/20/2022   BUN 28 (H) 11/20/2022   CREATININE 1.34 (H) 11/20/2022   CALCIUM 10.3 11/20/2022   GFRNONAA 48 (L) 05/18/2022   GFRAA 63 12/16/2019    Assessment and Plan Hx of MSSA infection/TAVR and pacemaker infection = Will refill - cefadroxil 500mg  po bid for chronic suppression.   Doing well on antibiotics no side effect Labs today; cr was high 11/2022 F/u 6 months

## 2023-01-12 NOTE — Patient Instructions (Signed)
Will repeat blood test  Continue cefadroxil 500 mg twice a day  Will see you in 6 months for follow up

## 2023-01-13 DIAGNOSIS — I110 Hypertensive heart disease with heart failure: Secondary | ICD-10-CM | POA: Diagnosis not present

## 2023-01-13 DIAGNOSIS — I5022 Chronic systolic (congestive) heart failure: Secondary | ICD-10-CM | POA: Diagnosis not present

## 2023-01-13 DIAGNOSIS — I872 Venous insufficiency (chronic) (peripheral): Secondary | ICD-10-CM | POA: Diagnosis not present

## 2023-01-13 DIAGNOSIS — I4892 Unspecified atrial flutter: Secondary | ICD-10-CM | POA: Diagnosis not present

## 2023-01-13 DIAGNOSIS — E039 Hypothyroidism, unspecified: Secondary | ICD-10-CM | POA: Diagnosis not present

## 2023-01-13 DIAGNOSIS — I5033 Acute on chronic diastolic (congestive) heart failure: Secondary | ICD-10-CM | POA: Diagnosis not present

## 2023-01-13 DIAGNOSIS — I442 Atrioventricular block, complete: Secondary | ICD-10-CM | POA: Diagnosis not present

## 2023-01-13 DIAGNOSIS — Z6839 Body mass index (BMI) 39.0-39.9, adult: Secondary | ICD-10-CM

## 2023-01-13 DIAGNOSIS — I480 Paroxysmal atrial fibrillation: Secondary | ICD-10-CM | POA: Diagnosis not present

## 2023-01-13 DIAGNOSIS — E1151 Type 2 diabetes mellitus with diabetic peripheral angiopathy without gangrene: Secondary | ICD-10-CM | POA: Diagnosis not present

## 2023-01-13 DIAGNOSIS — E1140 Type 2 diabetes mellitus with diabetic neuropathy, unspecified: Secondary | ICD-10-CM | POA: Diagnosis not present

## 2023-01-15 ENCOUNTER — Other Ambulatory Visit: Payer: Medicare Other

## 2023-01-15 ENCOUNTER — Encounter: Payer: Self-pay | Admitting: Orthopedic Surgery

## 2023-01-15 ENCOUNTER — Other Ambulatory Visit: Payer: Self-pay

## 2023-01-15 DIAGNOSIS — Z950 Presence of cardiac pacemaker: Secondary | ICD-10-CM | POA: Diagnosis not present

## 2023-01-15 DIAGNOSIS — I4892 Unspecified atrial flutter: Secondary | ICD-10-CM | POA: Diagnosis not present

## 2023-01-15 DIAGNOSIS — Z792 Long term (current) use of antibiotics: Secondary | ICD-10-CM | POA: Diagnosis not present

## 2023-01-15 DIAGNOSIS — I872 Venous insufficiency (chronic) (peripheral): Secondary | ICD-10-CM | POA: Diagnosis not present

## 2023-01-15 DIAGNOSIS — Z7982 Long term (current) use of aspirin: Secondary | ICD-10-CM | POA: Diagnosis not present

## 2023-01-15 DIAGNOSIS — I480 Paroxysmal atrial fibrillation: Secondary | ICD-10-CM | POA: Diagnosis not present

## 2023-01-15 DIAGNOSIS — E1151 Type 2 diabetes mellitus with diabetic peripheral angiopathy without gangrene: Secondary | ICD-10-CM | POA: Diagnosis not present

## 2023-01-15 DIAGNOSIS — I110 Hypertensive heart disease with heart failure: Secondary | ICD-10-CM | POA: Diagnosis not present

## 2023-01-15 DIAGNOSIS — I358 Other nonrheumatic aortic valve disorders: Secondary | ICD-10-CM | POA: Diagnosis not present

## 2023-01-15 DIAGNOSIS — M1712 Unilateral primary osteoarthritis, left knee: Secondary | ICD-10-CM | POA: Diagnosis not present

## 2023-01-15 DIAGNOSIS — M1612 Unilateral primary osteoarthritis, left hip: Secondary | ICD-10-CM | POA: Diagnosis not present

## 2023-01-15 DIAGNOSIS — E039 Hypothyroidism, unspecified: Secondary | ICD-10-CM | POA: Diagnosis not present

## 2023-01-15 DIAGNOSIS — E1140 Type 2 diabetes mellitus with diabetic neuropathy, unspecified: Secondary | ICD-10-CM | POA: Diagnosis not present

## 2023-01-15 DIAGNOSIS — I5022 Chronic systolic (congestive) heart failure: Secondary | ICD-10-CM | POA: Diagnosis not present

## 2023-01-15 DIAGNOSIS — Z9181 History of falling: Secondary | ICD-10-CM | POA: Diagnosis not present

## 2023-01-15 DIAGNOSIS — K219 Gastro-esophageal reflux disease without esophagitis: Secondary | ICD-10-CM | POA: Diagnosis not present

## 2023-01-15 DIAGNOSIS — Z86711 Personal history of pulmonary embolism: Secondary | ICD-10-CM | POA: Diagnosis not present

## 2023-01-15 DIAGNOSIS — Z8616 Personal history of COVID-19: Secondary | ICD-10-CM | POA: Diagnosis not present

## 2023-01-15 DIAGNOSIS — I442 Atrioventricular block, complete: Secondary | ICD-10-CM | POA: Diagnosis not present

## 2023-01-15 DIAGNOSIS — I5033 Acute on chronic diastolic (congestive) heart failure: Secondary | ICD-10-CM | POA: Diagnosis not present

## 2023-01-15 MED ORDER — METHYLPREDNISOLONE ACETATE 40 MG/ML IJ SUSP
40.0000 mg | INTRAMUSCULAR | Status: AC | PRN
Start: 2023-01-15 — End: 2023-01-15
  Administered 2023-01-15: 40 mg via INTRA_ARTICULAR

## 2023-01-15 MED ORDER — LIDOCAINE HCL (PF) 1 % IJ SOLN
5.0000 mL | INTRAMUSCULAR | Status: AC | PRN
Start: 2023-01-15 — End: 2023-01-15
  Administered 2023-01-15: 5 mL

## 2023-01-15 NOTE — Progress Notes (Signed)
Office Visit Note   Patient: Jessica [name]           Date of Birth: 1933-10-05           MRN: [id number] Visit Date: 01/11/2023              Requested by: Tresa Garter, MD [address] [place],  [place] [address] PCP: Plotnikov, Georgina Quint, MD  Chief Complaint  Patient presents with   Left Ankle - Pain   Left Knee - Pain      HPI: Patient is an 89 year old woman who presents with chronic left knee and left ankle pain.  Patient denies any recent injuries.  Patient recently has been seen at [facility].  Patient is currently using Voltaren gel she is on allopurinol 100 mg a day her last uric acid on October 4 was 6.5.  Patient states she was recently hospitalized for a few months from January to March.  Assessment & Plan: Visit Diagnoses:  1. Unilateral primary osteoarthritis, left knee   2. Pain in left ankle and joints of left foot     Plan: Left knee was injected she tolerated this well recommended stiff soled shoes to support her plantar fascia.  Follow-Up Instructions: No follow-ups on file.   Ortho Exam  Patient is alert, oriented, no adenopathy, well-dressed, normal affect, normal respiratory effort. Examination patient has pain to palpation over the posterior tibial tendon on the left.  She states she has had a history of redness and swelling.  She states she is generalized weak and is getting home health physical therapy.  She has a good dorsalis pedis pulse she has good function of the posterior tibial tendon she is in flip-flops.  Ankle radiographs show no cystic changes.  The foot radiographs today show periarticular cystic changes in the midfoot consistent with gout.  Examination of the left knee she has crepitation with range of motion collaterals and cruciates are stable.  There is no cellulitis there is tenderness to palpation the patellofemoral joint as well as medial lateral joint line.  Imaging: No results found. No images are attached to the  encounter.  Labs: Lab Results  Component Value Date   HGBA1C 8.1 (H) 08/14/2022   HGBA1C 8.4 (H) 04/19/2022   HGBA1C 10.0 (H) 12/26/2021   ESRSEDRATE 42 (H) 12/08/2016   LABURIC 6.3 08/26/2010   LABURIC 4.1 07/11/2010   LABURIC 4.6 04/07/2010   REPTSTATUS 04/04/2022 FINAL 03/30/2022   CULT  03/30/2022    NO GROWTH 5 DAYS Performed at [facility], [address] [place], [place] [address]    [facility] STAPHYLOCOCCUS AUREUS 03/28/2022     Lab Results  Component Value Date   ALBUMIN 4.3 11/20/2022   ALBUMIN 4.0 08/14/2022   ALBUMIN 2.6 (L) 05/14/2022    Lab Results  Component Value Date   MG 1.9 05/16/2022   MG 1.7 05/15/2022   MG 1.4 (L) 05/14/2022   Lab Results  Component Value Date   VD25OH 57.52 08/14/2022   VD25OH 70 08/26/2010   VD25OH 61 09/30/2008    No results found for: "PREALBUMIN"    Latest Ref Rng & Units 11/20/2022    1:46 PM 07/14/2022   11:21 AM 06/20/2022    2:07 AM  CBC EXTENDED  WBC 4.0 - 10.5 K/uL 10.2  6.7  6.9   RBC 3.87 - 5.11 Mil/uL 4.06  3.87  3.88   Hemoglobin 12.0 - 15.0 g/dL 44.0  10.2  72.5  HCT 36.0 - 46.0 % 38.0  35.4  37.0   Platelets 150.0 - 400.0 K/uL 223.0  244  293   NEUT# 1.4 - 7.7 K/uL 4.9   3,312   Lymph# 0.7 - 4.0 K/uL 3.2   2,498      There is no height or weight on file to calculate BMI.  Orders:  No orders of the defined types were placed in this encounter.  No orders of the defined types were placed in this encounter.    Procedures: Large Joint Inj: L knee on 01/15/2023 8:44 AM Indications: pain and diagnostic evaluation Details: 22 G 1.5 in needle, anteromedial approach  Arthrogram: No  Medications: 5 mL lidocaine (PF) 1 %; 40 mg methylPREDNISolone acetate 40 MG/ML Outcome: tolerated well, no immediate complications Procedure, treatment alternatives, risks and benefits explained, specific risks discussed. Consent was given by the patient. Immediately prior to procedure a time out was called to  verify the correct patient, procedure, equipment, support staff and site/side marked as required. Patient was prepped and draped in the usual sterile fashion.      Clinical Data: No additional findings.  ROS:  All other systems negative, except as noted in the HPI. Review of Systems  Objective: Vital Signs: There were no vitals taken for this visit.  Specialty Comments:  No specialty comments available.  PMFS History: Patient Active Problem List   Diagnosis Date Noted   UTI (urinary tract infection) 11/20/2022   Hair loss 08/14/2022   Hypokalemia 05/15/2022   Acute on chronic diastolic CHF (congestive heart failure) (HCC) 05/14/2022   ABLA (acute blood loss anemia) 04/20/2022   Elevated INR 04/20/2022   Prolonged QT interval 04/20/2022   Delirium 04/19/2022   History of bacteremia 04/18/2022   COVID-19 virus infection 04/18/2022   COPD with acute exacerbation (HCC) 04/18/2022   Dementia with behavioral disturbance (HCC) 04/18/2022   MSSA bacteremia 04/04/2022   Prosthetic valve endocarditis (HCC) 04/04/2022   Pacemaker infection (HCC) 04/04/2022   Sepsis (HCC) 03/28/2022   Altered mental status 03/28/2022   Hypovolemia 03/28/2022   AKI (acute kidney injury) (HCC) 03/28/2022   Severe sepsis with acute organ dysfunction (HCC) 03/28/2022   DNR (do not resuscitate) 03/28/2022   Hypotension 03/28/2022   Rectal bleeding    Pure hypercholesterolemia 01/28/2021   Fall 12/22/2020   Knee contusion 12/22/2020   Eczema 09/09/2019   Vertigo 03/05/2019   S/P TAVR (transcatheter aortic valve replacement) 01/14/2019   Presence of permanent cardiac pacemaker    Pulmonary nodule    Severe aortic stenosis    Atrial fibrillation and flutter (HCC) 07/05/2017   DOE (dyspnea on exertion) 04/18/2017   Chronic venous insufficiency 12/08/2016   Erythema nodosum 11/10/2016   Paronychia of finger, left 04/13/2016   Left carotid artery stenosis 09/24/2014   Diarrhea 07/12/2013   Foot  pain, left 03/18/2013   Gastrocnemius strain 03/18/2013   GERD (gastroesophageal reflux disease) 08/18/2011   History of pulmonary embolism 10/2008   DUODENITIS, WITHOUT HEMORRHAGE 06/29/2008   Diverticulosis of colon without hemorrhage 06/29/2008   DM type 2, controlled, with complication (HCC) 01/20/2008   Low back pain 10/15/2007   OBESITY, MORBID 07/16/2007   Grieving 05/15/2007   HLD (hyperlipidemia) 03/03/2007   Hypothyroidism 11/06/2006   Anxiety disorder 11/06/2006   CARPAL TUNNEL SYNDROME 11/06/2006   Essential hypertension 11/06/2006   HIATAL HERNIA 11/06/2006   Osteoarthritis 11/06/2006   History of endocarditis 11/06/2006   Past Medical History:  Diagnosis Date   Anxiety  Atrial fibrillation and flutter (HCC)    detected by PPM interrogation (mostly atrial flutter)   AV block, 2nd degree 10/2012   s/p MDT ADDRL1 pacemaker implantation 10/10/2012 by Dr Johney Frame   Chronic diastolic heart failure (HCC)    Depression    GERD (gastroesophageal reflux disease)    HH (hiatus hernia)    History of pulmonary embolism 10/2008   Bilateral   Hyperlipidemia    Hypertension    Hypothyroidism    Obesity    Osteoarthritis    Peripheral neuropathy    Right leg   Presence of permanent cardiac pacemaker    Pulmonary nodule    in the lingula. Noted on pre TAVR CT. Will require follow up   Renal insufficiency 2010   S/P TAVR (transcatheter aortic valve replacement)    Shingles 09/17/2014   right lower quadrant   Type II or unspecified type diabetes mellitus without mention of complication, not stated as uncontrolled 2009    Family History  Problem Relation Age of Onset   Stroke Brother 15   Coronary artery disease Mother    Heart disease Mother 19   Stroke Father 31   Multiple myeloma Brother    Heart attack Son 46       Died of MI   Heart attack Daughter 84       Died of MI   Aneurysm Daughter 40       Question cerebral   Heart disease Maternal Grandmother     Heart disease Maternal Grandfather    Peripheral vascular disease Daughter        Amputation secondary to DM    Past Surgical History:  Procedure Laterality Date   APPENDECTOMY     AV NODE ABLATION N/A 07/05/2017   Procedure: AV NODE ABLATION;  Surgeon: Hillis Range, MD;  Location: [facility] INVASIVE CV LAB;  Service: Cardiovascular;  Laterality: N/A;   CARPAL TUNNEL RELEASE     CATARACT EXTRACTION     CHOLECYSTECTOMY     COLONOSCOPY  2010   COLONOSCOPY WITH PROPOFOL N/A 07/25/2021   Procedure: COLONOSCOPY WITH PROPOFOL;  Surgeon: Napoleon Form, MD;  Location: [facility] ENDOSCOPY;  Service: Gastroenterology;  Laterality: N/A;   FOREARM FRACTURE SURGERY  09/17/2008   HERNIA REPAIR     INGUINAL HERNIA REPAIR     PACEMAKER INSERTION  10/10/2012   MDT ADDRL1 implanted for 2nd degree AV block by Dr Johney Frame   PERMANENT PACEMAKER INSERTION N/A 10/10/2012   Procedure: PERMANENT PACEMAKER INSERTION;  Surgeon: Hillis Range, MD;  Location: [facility] CATH LAB;  Service: Cardiovascular;  Laterality: N/A;   RIGHT/LEFT HEART CATH AND CORONARY ANGIOGRAPHY N/A 12/30/2018   Procedure: RIGHT/LEFT HEART CATH AND CORONARY ANGIOGRAPHY;  Surgeon: Kathleene Hazel, MD;  Location: [facility]AS[facility] CV LAB;  Service: Cardiovascular;  Laterality: N/A;   ROTATOR CUFF REPAIR     TEE WITHOUT CARDIOVERSION N/A 01/14/2019   Procedure: TRANSESOPHAGEAL ECHOCARDIOGRAM (TEE);  Surgeon: Kathleene Hazel, MD;  Location: [facility] INVASIVE CV LAB;  Service: Open Heart Surgery;  Laterality: N/A;   TEE WITHOUT CARDIOVERSION N/A 04/05/2022   Procedure: TRANSESOPHAGEAL ECHOCARDIOGRAM (TEE);  Surgeon: Maisie Fus, MD;  Location: [facility] ENDOSCOPY;  Service: Cardiovascular;  Laterality: N/A;   TRANSCATHETER AORTIC VALVE REPLACEMENT, TRANSFEMORAL N/A 01/14/2019   Procedure: TRANSCATHETER AORTIC VALVE REPLACEMENT, TRANSFEMORAL;  Surgeon: Kathleene Hazel, MD;  Location: [facility] INVASIVE CV LAB;  Service: Open Heart Surgery;  Laterality: N/A;    Social History   Occupational History   Not  on file  Tobacco Use   Smoking status: Never   Smokeless tobacco: Never  Vaping Use   Vaping status: Never Used  Substance and Sexual Activity   Alcohol use: No    Alcohol/week: 0.0 standard drinks of alcohol   Drug use: No   Sexual activity: Not Currently

## 2023-01-16 ENCOUNTER — Other Ambulatory Visit (HOSPITAL_BASED_OUTPATIENT_CLINIC_OR_DEPARTMENT_OTHER): Payer: Self-pay | Admitting: Cardiology

## 2023-01-16 DIAGNOSIS — I480 Paroxysmal atrial fibrillation: Secondary | ICD-10-CM | POA: Diagnosis not present

## 2023-01-16 DIAGNOSIS — E039 Hypothyroidism, unspecified: Secondary | ICD-10-CM | POA: Diagnosis not present

## 2023-01-16 DIAGNOSIS — E1140 Type 2 diabetes mellitus with diabetic neuropathy, unspecified: Secondary | ICD-10-CM | POA: Diagnosis not present

## 2023-01-16 DIAGNOSIS — M1612 Unilateral primary osteoarthritis, left hip: Secondary | ICD-10-CM | POA: Diagnosis not present

## 2023-01-16 DIAGNOSIS — I4892 Unspecified atrial flutter: Secondary | ICD-10-CM | POA: Diagnosis not present

## 2023-01-16 DIAGNOSIS — Z86711 Personal history of pulmonary embolism: Secondary | ICD-10-CM | POA: Diagnosis not present

## 2023-01-16 DIAGNOSIS — Z9181 History of falling: Secondary | ICD-10-CM | POA: Diagnosis not present

## 2023-01-16 DIAGNOSIS — I872 Venous insufficiency (chronic) (peripheral): Secondary | ICD-10-CM | POA: Diagnosis not present

## 2023-01-16 DIAGNOSIS — I5033 Acute on chronic diastolic (congestive) heart failure: Secondary | ICD-10-CM | POA: Diagnosis not present

## 2023-01-16 DIAGNOSIS — Z8616 Personal history of COVID-19: Secondary | ICD-10-CM | POA: Diagnosis not present

## 2023-01-16 DIAGNOSIS — Z792 Long term (current) use of antibiotics: Secondary | ICD-10-CM | POA: Diagnosis not present

## 2023-01-16 DIAGNOSIS — Z7982 Long term (current) use of aspirin: Secondary | ICD-10-CM | POA: Diagnosis not present

## 2023-01-16 DIAGNOSIS — I5022 Chronic systolic (congestive) heart failure: Secondary | ICD-10-CM | POA: Diagnosis not present

## 2023-01-16 DIAGNOSIS — K219 Gastro-esophageal reflux disease without esophagitis: Secondary | ICD-10-CM | POA: Diagnosis not present

## 2023-01-16 DIAGNOSIS — E1151 Type 2 diabetes mellitus with diabetic peripheral angiopathy without gangrene: Secondary | ICD-10-CM | POA: Diagnosis not present

## 2023-01-16 DIAGNOSIS — I442 Atrioventricular block, complete: Secondary | ICD-10-CM | POA: Diagnosis not present

## 2023-01-16 DIAGNOSIS — I358 Other nonrheumatic aortic valve disorders: Secondary | ICD-10-CM | POA: Diagnosis not present

## 2023-01-16 DIAGNOSIS — Z950 Presence of cardiac pacemaker: Secondary | ICD-10-CM | POA: Diagnosis not present

## 2023-01-16 DIAGNOSIS — I110 Hypertensive heart disease with heart failure: Secondary | ICD-10-CM | POA: Diagnosis not present

## 2023-01-16 LAB — CBC WITH DIFFERENTIAL/PLATELET
Absolute Monocytes: 1821 {cells}/uL — ABNORMAL HIGH (ref 200–950)
Basophils Absolute: 58 {cells}/uL (ref 0–200)
Basophils Relative: 0.5 %
Eosinophils Absolute: 46 {cells}/uL (ref 15–500)
Eosinophils Relative: 0.4 %
HCT: 45.4 % — ABNORMAL HIGH (ref 35.0–45.0)
Hemoglobin: 14.8 g/dL (ref 11.7–15.5)
Lymphs Abs: 3990 {cells}/uL — ABNORMAL HIGH (ref 850–3900)
MCH: 31.1 pg (ref 27.0–33.0)
MCHC: 32.6 g/dL (ref 32.0–36.0)
MCV: 95.4 fL (ref 80.0–100.0)
MPV: 10 fL (ref 7.5–12.5)
Monocytes Relative: 15.7 %
Neutro Abs: 5684 {cells}/uL (ref 1500–7800)
Neutrophils Relative %: 49 %
Platelets: 219 10*3/uL (ref 140–400)
RBC: 4.76 10*6/uL (ref 3.80–5.10)
RDW: 12.5 % (ref 11.0–15.0)
Total Lymphocyte: 34.4 %
WBC: 11.6 10*3/uL — ABNORMAL HIGH (ref 3.8–10.8)

## 2023-01-16 LAB — COMPLETE METABOLIC PANEL WITH GFR
AG Ratio: 1.2 (calc) (ref 1.0–2.5)
ALT: 46 U/L — ABNORMAL HIGH (ref 6–29)
AST: 41 U/L — ABNORMAL HIGH (ref 10–35)
Albumin: 4.6 g/dL (ref 3.6–5.1)
Alkaline phosphatase (APISO): 90 U/L (ref 37–153)
BUN/Creatinine Ratio: 31 (calc) — ABNORMAL HIGH (ref 6–22)
BUN: 39 mg/dL — ABNORMAL HIGH (ref 7–25)
CO2: 23 mmol/L (ref 20–32)
Calcium: 10.6 mg/dL — ABNORMAL HIGH (ref 8.6–10.4)
Chloride: 99 mmol/L (ref 98–110)
Creat: 1.26 mg/dL — ABNORMAL HIGH (ref 0.60–0.95)
Globulin: 4 g/dL — ABNORMAL HIGH (ref 1.9–3.7)
Glucose, Bld: 210 mg/dL — ABNORMAL HIGH (ref 65–99)
Potassium: 4.9 mmol/L (ref 3.5–5.3)
Sodium: 135 mmol/L (ref 135–146)
Total Bilirubin: 0.5 mg/dL (ref 0.2–1.2)
Total Protein: 8.6 g/dL — ABNORMAL HIGH (ref 6.1–8.1)
eGFR: 41 mL/min/{1.73_m2} — ABNORMAL LOW (ref >=60)

## 2023-01-19 DIAGNOSIS — I872 Venous insufficiency (chronic) (peripheral): Secondary | ICD-10-CM | POA: Diagnosis not present

## 2023-01-19 DIAGNOSIS — I4892 Unspecified atrial flutter: Secondary | ICD-10-CM | POA: Diagnosis not present

## 2023-01-19 DIAGNOSIS — I110 Hypertensive heart disease with heart failure: Secondary | ICD-10-CM | POA: Diagnosis not present

## 2023-01-19 DIAGNOSIS — M1612 Unilateral primary osteoarthritis, left hip: Secondary | ICD-10-CM | POA: Diagnosis not present

## 2023-01-19 DIAGNOSIS — Z7982 Long term (current) use of aspirin: Secondary | ICD-10-CM | POA: Diagnosis not present

## 2023-01-19 DIAGNOSIS — I5022 Chronic systolic (congestive) heart failure: Secondary | ICD-10-CM | POA: Diagnosis not present

## 2023-01-19 DIAGNOSIS — Z86711 Personal history of pulmonary embolism: Secondary | ICD-10-CM | POA: Diagnosis not present

## 2023-01-19 DIAGNOSIS — E1151 Type 2 diabetes mellitus with diabetic peripheral angiopathy without gangrene: Secondary | ICD-10-CM | POA: Diagnosis not present

## 2023-01-19 DIAGNOSIS — Z8616 Personal history of COVID-19: Secondary | ICD-10-CM | POA: Diagnosis not present

## 2023-01-19 DIAGNOSIS — E1140 Type 2 diabetes mellitus with diabetic neuropathy, unspecified: Secondary | ICD-10-CM | POA: Diagnosis not present

## 2023-01-19 DIAGNOSIS — E039 Hypothyroidism, unspecified: Secondary | ICD-10-CM | POA: Diagnosis not present

## 2023-01-19 DIAGNOSIS — I480 Paroxysmal atrial fibrillation: Secondary | ICD-10-CM | POA: Diagnosis not present

## 2023-01-19 DIAGNOSIS — Z792 Long term (current) use of antibiotics: Secondary | ICD-10-CM | POA: Diagnosis not present

## 2023-01-19 DIAGNOSIS — Z9181 History of falling: Secondary | ICD-10-CM | POA: Diagnosis not present

## 2023-01-19 DIAGNOSIS — I5033 Acute on chronic diastolic (congestive) heart failure: Secondary | ICD-10-CM | POA: Diagnosis not present

## 2023-01-19 DIAGNOSIS — I358 Other nonrheumatic aortic valve disorders: Secondary | ICD-10-CM | POA: Diagnosis not present

## 2023-01-19 DIAGNOSIS — Z950 Presence of cardiac pacemaker: Secondary | ICD-10-CM | POA: Diagnosis not present

## 2023-01-19 DIAGNOSIS — I442 Atrioventricular block, complete: Secondary | ICD-10-CM | POA: Diagnosis not present

## 2023-01-19 DIAGNOSIS — K219 Gastro-esophageal reflux disease without esophagitis: Secondary | ICD-10-CM | POA: Diagnosis not present

## 2023-01-23 DIAGNOSIS — Z9181 History of falling: Secondary | ICD-10-CM | POA: Diagnosis not present

## 2023-01-23 DIAGNOSIS — Z8616 Personal history of COVID-19: Secondary | ICD-10-CM | POA: Diagnosis not present

## 2023-01-23 DIAGNOSIS — Z86711 Personal history of pulmonary embolism: Secondary | ICD-10-CM | POA: Diagnosis not present

## 2023-01-23 DIAGNOSIS — E1151 Type 2 diabetes mellitus with diabetic peripheral angiopathy without gangrene: Secondary | ICD-10-CM | POA: Diagnosis not present

## 2023-01-23 DIAGNOSIS — I872 Venous insufficiency (chronic) (peripheral): Secondary | ICD-10-CM | POA: Diagnosis not present

## 2023-01-23 DIAGNOSIS — I5033 Acute on chronic diastolic (congestive) heart failure: Secondary | ICD-10-CM | POA: Diagnosis not present

## 2023-01-23 DIAGNOSIS — I4892 Unspecified atrial flutter: Secondary | ICD-10-CM | POA: Diagnosis not present

## 2023-01-23 DIAGNOSIS — I358 Other nonrheumatic aortic valve disorders: Secondary | ICD-10-CM | POA: Diagnosis not present

## 2023-01-23 DIAGNOSIS — E1140 Type 2 diabetes mellitus with diabetic neuropathy, unspecified: Secondary | ICD-10-CM | POA: Diagnosis not present

## 2023-01-23 DIAGNOSIS — I5022 Chronic systolic (congestive) heart failure: Secondary | ICD-10-CM | POA: Diagnosis not present

## 2023-01-23 DIAGNOSIS — M1612 Unilateral primary osteoarthritis, left hip: Secondary | ICD-10-CM | POA: Diagnosis not present

## 2023-01-23 DIAGNOSIS — K219 Gastro-esophageal reflux disease without esophagitis: Secondary | ICD-10-CM | POA: Diagnosis not present

## 2023-01-23 DIAGNOSIS — I442 Atrioventricular block, complete: Secondary | ICD-10-CM | POA: Diagnosis not present

## 2023-01-23 DIAGNOSIS — Z950 Presence of cardiac pacemaker: Secondary | ICD-10-CM | POA: Diagnosis not present

## 2023-01-23 DIAGNOSIS — Z7982 Long term (current) use of aspirin: Secondary | ICD-10-CM | POA: Diagnosis not present

## 2023-01-23 DIAGNOSIS — I480 Paroxysmal atrial fibrillation: Secondary | ICD-10-CM | POA: Diagnosis not present

## 2023-01-23 DIAGNOSIS — I110 Hypertensive heart disease with heart failure: Secondary | ICD-10-CM | POA: Diagnosis not present

## 2023-01-23 DIAGNOSIS — E039 Hypothyroidism, unspecified: Secondary | ICD-10-CM | POA: Diagnosis not present

## 2023-01-23 DIAGNOSIS — Z792 Long term (current) use of antibiotics: Secondary | ICD-10-CM | POA: Diagnosis not present

## 2023-01-29 DIAGNOSIS — I358 Other nonrheumatic aortic valve disorders: Secondary | ICD-10-CM | POA: Diagnosis not present

## 2023-01-29 DIAGNOSIS — Z792 Long term (current) use of antibiotics: Secondary | ICD-10-CM | POA: Diagnosis not present

## 2023-01-29 DIAGNOSIS — Z7982 Long term (current) use of aspirin: Secondary | ICD-10-CM | POA: Diagnosis not present

## 2023-01-29 DIAGNOSIS — I480 Paroxysmal atrial fibrillation: Secondary | ICD-10-CM | POA: Diagnosis not present

## 2023-01-29 DIAGNOSIS — I442 Atrioventricular block, complete: Secondary | ICD-10-CM | POA: Diagnosis not present

## 2023-01-29 DIAGNOSIS — I5022 Chronic systolic (congestive) heart failure: Secondary | ICD-10-CM | POA: Diagnosis not present

## 2023-01-29 DIAGNOSIS — K219 Gastro-esophageal reflux disease without esophagitis: Secondary | ICD-10-CM | POA: Diagnosis not present

## 2023-01-29 DIAGNOSIS — I872 Venous insufficiency (chronic) (peripheral): Secondary | ICD-10-CM | POA: Diagnosis not present

## 2023-01-29 DIAGNOSIS — I5033 Acute on chronic diastolic (congestive) heart failure: Secondary | ICD-10-CM | POA: Diagnosis not present

## 2023-01-29 DIAGNOSIS — I4892 Unspecified atrial flutter: Secondary | ICD-10-CM | POA: Diagnosis not present

## 2023-01-29 DIAGNOSIS — Z9181 History of falling: Secondary | ICD-10-CM | POA: Diagnosis not present

## 2023-01-29 DIAGNOSIS — Z950 Presence of cardiac pacemaker: Secondary | ICD-10-CM | POA: Diagnosis not present

## 2023-01-29 DIAGNOSIS — Z8616 Personal history of COVID-19: Secondary | ICD-10-CM | POA: Diagnosis not present

## 2023-01-29 DIAGNOSIS — M1612 Unilateral primary osteoarthritis, left hip: Secondary | ICD-10-CM | POA: Diagnosis not present

## 2023-01-29 DIAGNOSIS — E1140 Type 2 diabetes mellitus with diabetic neuropathy, unspecified: Secondary | ICD-10-CM | POA: Diagnosis not present

## 2023-01-29 DIAGNOSIS — Z86711 Personal history of pulmonary embolism: Secondary | ICD-10-CM | POA: Diagnosis not present

## 2023-01-29 DIAGNOSIS — I110 Hypertensive heart disease with heart failure: Secondary | ICD-10-CM | POA: Diagnosis not present

## 2023-01-29 DIAGNOSIS — E039 Hypothyroidism, unspecified: Secondary | ICD-10-CM | POA: Diagnosis not present

## 2023-01-29 DIAGNOSIS — E1151 Type 2 diabetes mellitus with diabetic peripheral angiopathy without gangrene: Secondary | ICD-10-CM | POA: Diagnosis not present

## 2023-01-30 DIAGNOSIS — Z950 Presence of cardiac pacemaker: Secondary | ICD-10-CM | POA: Diagnosis not present

## 2023-01-30 DIAGNOSIS — I4892 Unspecified atrial flutter: Secondary | ICD-10-CM | POA: Diagnosis not present

## 2023-01-30 DIAGNOSIS — Z7982 Long term (current) use of aspirin: Secondary | ICD-10-CM | POA: Diagnosis not present

## 2023-01-30 DIAGNOSIS — I358 Other nonrheumatic aortic valve disorders: Secondary | ICD-10-CM | POA: Diagnosis not present

## 2023-01-30 DIAGNOSIS — I5033 Acute on chronic diastolic (congestive) heart failure: Secondary | ICD-10-CM | POA: Diagnosis not present

## 2023-01-30 DIAGNOSIS — Z9181 History of falling: Secondary | ICD-10-CM | POA: Diagnosis not present

## 2023-01-30 DIAGNOSIS — Z86711 Personal history of pulmonary embolism: Secondary | ICD-10-CM | POA: Diagnosis not present

## 2023-01-30 DIAGNOSIS — I872 Venous insufficiency (chronic) (peripheral): Secondary | ICD-10-CM | POA: Diagnosis not present

## 2023-01-30 DIAGNOSIS — K219 Gastro-esophageal reflux disease without esophagitis: Secondary | ICD-10-CM | POA: Diagnosis not present

## 2023-01-30 DIAGNOSIS — I442 Atrioventricular block, complete: Secondary | ICD-10-CM | POA: Diagnosis not present

## 2023-01-30 DIAGNOSIS — E039 Hypothyroidism, unspecified: Secondary | ICD-10-CM | POA: Diagnosis not present

## 2023-01-30 DIAGNOSIS — Z8616 Personal history of COVID-19: Secondary | ICD-10-CM | POA: Diagnosis not present

## 2023-01-30 DIAGNOSIS — I480 Paroxysmal atrial fibrillation: Secondary | ICD-10-CM | POA: Diagnosis not present

## 2023-01-30 DIAGNOSIS — Z792 Long term (current) use of antibiotics: Secondary | ICD-10-CM | POA: Diagnosis not present

## 2023-01-30 DIAGNOSIS — I5022 Chronic systolic (congestive) heart failure: Secondary | ICD-10-CM | POA: Diagnosis not present

## 2023-01-30 DIAGNOSIS — I110 Hypertensive heart disease with heart failure: Secondary | ICD-10-CM | POA: Diagnosis not present

## 2023-01-30 DIAGNOSIS — E1140 Type 2 diabetes mellitus with diabetic neuropathy, unspecified: Secondary | ICD-10-CM | POA: Diagnosis not present

## 2023-01-30 DIAGNOSIS — E1151 Type 2 diabetes mellitus with diabetic peripheral angiopathy without gangrene: Secondary | ICD-10-CM | POA: Diagnosis not present

## 2023-01-30 DIAGNOSIS — M1612 Unilateral primary osteoarthritis, left hip: Secondary | ICD-10-CM | POA: Diagnosis not present

## 2023-01-31 DIAGNOSIS — I442 Atrioventricular block, complete: Secondary | ICD-10-CM | POA: Diagnosis not present

## 2023-01-31 DIAGNOSIS — I5033 Acute on chronic diastolic (congestive) heart failure: Secondary | ICD-10-CM | POA: Diagnosis not present

## 2023-01-31 DIAGNOSIS — I110 Hypertensive heart disease with heart failure: Secondary | ICD-10-CM | POA: Diagnosis not present

## 2023-01-31 DIAGNOSIS — I5022 Chronic systolic (congestive) heart failure: Secondary | ICD-10-CM | POA: Diagnosis not present

## 2023-01-31 DIAGNOSIS — I480 Paroxysmal atrial fibrillation: Secondary | ICD-10-CM | POA: Diagnosis not present

## 2023-01-31 DIAGNOSIS — E039 Hypothyroidism, unspecified: Secondary | ICD-10-CM | POA: Diagnosis not present

## 2023-01-31 DIAGNOSIS — I4892 Unspecified atrial flutter: Secondary | ICD-10-CM | POA: Diagnosis not present

## 2023-01-31 DIAGNOSIS — I872 Venous insufficiency (chronic) (peripheral): Secondary | ICD-10-CM | POA: Diagnosis not present

## 2023-01-31 DIAGNOSIS — E1151 Type 2 diabetes mellitus with diabetic peripheral angiopathy without gangrene: Secondary | ICD-10-CM | POA: Diagnosis not present

## 2023-01-31 DIAGNOSIS — E1140 Type 2 diabetes mellitus with diabetic neuropathy, unspecified: Secondary | ICD-10-CM | POA: Diagnosis not present

## 2023-01-31 DIAGNOSIS — Z6839 Body mass index (BMI) 39.0-39.9, adult: Secondary | ICD-10-CM

## 2023-02-01 DIAGNOSIS — Z86711 Personal history of pulmonary embolism: Secondary | ICD-10-CM | POA: Diagnosis not present

## 2023-02-01 DIAGNOSIS — I480 Paroxysmal atrial fibrillation: Secondary | ICD-10-CM | POA: Diagnosis not present

## 2023-02-01 DIAGNOSIS — K219 Gastro-esophageal reflux disease without esophagitis: Secondary | ICD-10-CM | POA: Diagnosis not present

## 2023-02-01 DIAGNOSIS — I872 Venous insufficiency (chronic) (peripheral): Secondary | ICD-10-CM | POA: Diagnosis not present

## 2023-02-01 DIAGNOSIS — E1151 Type 2 diabetes mellitus with diabetic peripheral angiopathy without gangrene: Secondary | ICD-10-CM | POA: Diagnosis not present

## 2023-02-01 DIAGNOSIS — I110 Hypertensive heart disease with heart failure: Secondary | ICD-10-CM | POA: Diagnosis not present

## 2023-02-01 DIAGNOSIS — I5022 Chronic systolic (congestive) heart failure: Secondary | ICD-10-CM | POA: Diagnosis not present

## 2023-02-01 DIAGNOSIS — Z792 Long term (current) use of antibiotics: Secondary | ICD-10-CM | POA: Diagnosis not present

## 2023-02-01 DIAGNOSIS — I4892 Unspecified atrial flutter: Secondary | ICD-10-CM | POA: Diagnosis not present

## 2023-02-01 DIAGNOSIS — I442 Atrioventricular block, complete: Secondary | ICD-10-CM | POA: Diagnosis not present

## 2023-02-01 DIAGNOSIS — E039 Hypothyroidism, unspecified: Secondary | ICD-10-CM | POA: Diagnosis not present

## 2023-02-01 DIAGNOSIS — Z950 Presence of cardiac pacemaker: Secondary | ICD-10-CM | POA: Diagnosis not present

## 2023-02-01 DIAGNOSIS — M1612 Unilateral primary osteoarthritis, left hip: Secondary | ICD-10-CM | POA: Diagnosis not present

## 2023-02-01 DIAGNOSIS — I5033 Acute on chronic diastolic (congestive) heart failure: Secondary | ICD-10-CM | POA: Diagnosis not present

## 2023-02-01 DIAGNOSIS — Z9181 History of falling: Secondary | ICD-10-CM | POA: Diagnosis not present

## 2023-02-01 DIAGNOSIS — Z8616 Personal history of COVID-19: Secondary | ICD-10-CM | POA: Diagnosis not present

## 2023-02-01 DIAGNOSIS — E1140 Type 2 diabetes mellitus with diabetic neuropathy, unspecified: Secondary | ICD-10-CM | POA: Diagnosis not present

## 2023-02-01 DIAGNOSIS — Z7982 Long term (current) use of aspirin: Secondary | ICD-10-CM | POA: Diagnosis not present

## 2023-02-01 DIAGNOSIS — I358 Other nonrheumatic aortic valve disorders: Secondary | ICD-10-CM | POA: Diagnosis not present

## 2023-02-02 ENCOUNTER — Other Ambulatory Visit: Payer: Self-pay | Admitting: Internal Medicine

## 2023-02-02 DIAGNOSIS — I442 Atrioventricular block, complete: Secondary | ICD-10-CM | POA: Diagnosis not present

## 2023-02-02 DIAGNOSIS — I872 Venous insufficiency (chronic) (peripheral): Secondary | ICD-10-CM | POA: Diagnosis not present

## 2023-02-02 DIAGNOSIS — K219 Gastro-esophageal reflux disease without esophagitis: Secondary | ICD-10-CM | POA: Diagnosis not present

## 2023-02-02 DIAGNOSIS — I480 Paroxysmal atrial fibrillation: Secondary | ICD-10-CM | POA: Diagnosis not present

## 2023-02-02 DIAGNOSIS — I4892 Unspecified atrial flutter: Secondary | ICD-10-CM | POA: Diagnosis not present

## 2023-02-02 DIAGNOSIS — I5032 Chronic diastolic (congestive) heart failure: Secondary | ICD-10-CM

## 2023-02-02 DIAGNOSIS — Z86711 Personal history of pulmonary embolism: Secondary | ICD-10-CM | POA: Diagnosis not present

## 2023-02-02 DIAGNOSIS — I358 Other nonrheumatic aortic valve disorders: Secondary | ICD-10-CM | POA: Diagnosis not present

## 2023-02-02 DIAGNOSIS — E039 Hypothyroidism, unspecified: Secondary | ICD-10-CM | POA: Diagnosis not present

## 2023-02-02 DIAGNOSIS — M1612 Unilateral primary osteoarthritis, left hip: Secondary | ICD-10-CM | POA: Diagnosis not present

## 2023-02-02 DIAGNOSIS — Z792 Long term (current) use of antibiotics: Secondary | ICD-10-CM | POA: Diagnosis not present

## 2023-02-02 DIAGNOSIS — Z7982 Long term (current) use of aspirin: Secondary | ICD-10-CM | POA: Diagnosis not present

## 2023-02-02 DIAGNOSIS — I5022 Chronic systolic (congestive) heart failure: Secondary | ICD-10-CM | POA: Diagnosis not present

## 2023-02-02 DIAGNOSIS — I110 Hypertensive heart disease with heart failure: Secondary | ICD-10-CM | POA: Diagnosis not present

## 2023-02-02 DIAGNOSIS — I5033 Acute on chronic diastolic (congestive) heart failure: Secondary | ICD-10-CM | POA: Diagnosis not present

## 2023-02-02 DIAGNOSIS — Z8616 Personal history of COVID-19: Secondary | ICD-10-CM | POA: Diagnosis not present

## 2023-02-02 DIAGNOSIS — E1140 Type 2 diabetes mellitus with diabetic neuropathy, unspecified: Secondary | ICD-10-CM | POA: Diagnosis not present

## 2023-02-02 DIAGNOSIS — Z950 Presence of cardiac pacemaker: Secondary | ICD-10-CM | POA: Diagnosis not present

## 2023-02-02 DIAGNOSIS — Z9181 History of falling: Secondary | ICD-10-CM | POA: Diagnosis not present

## 2023-02-02 DIAGNOSIS — E1151 Type 2 diabetes mellitus with diabetic peripheral angiopathy without gangrene: Secondary | ICD-10-CM | POA: Diagnosis not present

## 2023-02-05 DIAGNOSIS — Z9181 History of falling: Secondary | ICD-10-CM | POA: Diagnosis not present

## 2023-02-05 DIAGNOSIS — Z7982 Long term (current) use of aspirin: Secondary | ICD-10-CM | POA: Diagnosis not present

## 2023-02-05 DIAGNOSIS — Z792 Long term (current) use of antibiotics: Secondary | ICD-10-CM | POA: Diagnosis not present

## 2023-02-05 DIAGNOSIS — E039 Hypothyroidism, unspecified: Secondary | ICD-10-CM | POA: Diagnosis not present

## 2023-02-05 DIAGNOSIS — I442 Atrioventricular block, complete: Secondary | ICD-10-CM | POA: Diagnosis not present

## 2023-02-05 DIAGNOSIS — M1612 Unilateral primary osteoarthritis, left hip: Secondary | ICD-10-CM | POA: Diagnosis not present

## 2023-02-05 DIAGNOSIS — E1140 Type 2 diabetes mellitus with diabetic neuropathy, unspecified: Secondary | ICD-10-CM | POA: Diagnosis not present

## 2023-02-05 DIAGNOSIS — Z86711 Personal history of pulmonary embolism: Secondary | ICD-10-CM | POA: Diagnosis not present

## 2023-02-05 DIAGNOSIS — Z8616 Personal history of COVID-19: Secondary | ICD-10-CM | POA: Diagnosis not present

## 2023-02-05 DIAGNOSIS — Z950 Presence of cardiac pacemaker: Secondary | ICD-10-CM | POA: Diagnosis not present

## 2023-02-05 DIAGNOSIS — E1151 Type 2 diabetes mellitus with diabetic peripheral angiopathy without gangrene: Secondary | ICD-10-CM | POA: Diagnosis not present

## 2023-02-05 DIAGNOSIS — I358 Other nonrheumatic aortic valve disorders: Secondary | ICD-10-CM | POA: Diagnosis not present

## 2023-02-05 DIAGNOSIS — I110 Hypertensive heart disease with heart failure: Secondary | ICD-10-CM | POA: Diagnosis not present

## 2023-02-05 DIAGNOSIS — I872 Venous insufficiency (chronic) (peripheral): Secondary | ICD-10-CM | POA: Diagnosis not present

## 2023-02-05 DIAGNOSIS — I480 Paroxysmal atrial fibrillation: Secondary | ICD-10-CM | POA: Diagnosis not present

## 2023-02-05 DIAGNOSIS — I4892 Unspecified atrial flutter: Secondary | ICD-10-CM | POA: Diagnosis not present

## 2023-02-05 DIAGNOSIS — I5033 Acute on chronic diastolic (congestive) heart failure: Secondary | ICD-10-CM | POA: Diagnosis not present

## 2023-02-05 DIAGNOSIS — K219 Gastro-esophageal reflux disease without esophagitis: Secondary | ICD-10-CM | POA: Diagnosis not present

## 2023-02-05 DIAGNOSIS — I5022 Chronic systolic (congestive) heart failure: Secondary | ICD-10-CM | POA: Diagnosis not present

## 2023-02-06 ENCOUNTER — Other Ambulatory Visit: Payer: Self-pay | Admitting: Internal Medicine

## 2023-02-06 DIAGNOSIS — E1140 Type 2 diabetes mellitus with diabetic neuropathy, unspecified: Secondary | ICD-10-CM | POA: Diagnosis not present

## 2023-02-06 DIAGNOSIS — I358 Other nonrheumatic aortic valve disorders: Secondary | ICD-10-CM | POA: Diagnosis not present

## 2023-02-06 DIAGNOSIS — K219 Gastro-esophageal reflux disease without esophagitis: Secondary | ICD-10-CM | POA: Diagnosis not present

## 2023-02-06 DIAGNOSIS — M1612 Unilateral primary osteoarthritis, left hip: Secondary | ICD-10-CM | POA: Diagnosis not present

## 2023-02-06 DIAGNOSIS — I442 Atrioventricular block, complete: Secondary | ICD-10-CM | POA: Diagnosis not present

## 2023-02-06 DIAGNOSIS — Z950 Presence of cardiac pacemaker: Secondary | ICD-10-CM | POA: Diagnosis not present

## 2023-02-06 DIAGNOSIS — Z8616 Personal history of COVID-19: Secondary | ICD-10-CM | POA: Diagnosis not present

## 2023-02-06 DIAGNOSIS — E1151 Type 2 diabetes mellitus with diabetic peripheral angiopathy without gangrene: Secondary | ICD-10-CM | POA: Diagnosis not present

## 2023-02-06 DIAGNOSIS — I872 Venous insufficiency (chronic) (peripheral): Secondary | ICD-10-CM | POA: Diagnosis not present

## 2023-02-06 DIAGNOSIS — E039 Hypothyroidism, unspecified: Secondary | ICD-10-CM | POA: Diagnosis not present

## 2023-02-06 DIAGNOSIS — Z792 Long term (current) use of antibiotics: Secondary | ICD-10-CM | POA: Diagnosis not present

## 2023-02-06 DIAGNOSIS — I110 Hypertensive heart disease with heart failure: Secondary | ICD-10-CM | POA: Diagnosis not present

## 2023-02-06 DIAGNOSIS — Z9181 History of falling: Secondary | ICD-10-CM | POA: Diagnosis not present

## 2023-02-06 DIAGNOSIS — Z7982 Long term (current) use of aspirin: Secondary | ICD-10-CM | POA: Diagnosis not present

## 2023-02-06 DIAGNOSIS — I4892 Unspecified atrial flutter: Secondary | ICD-10-CM | POA: Diagnosis not present

## 2023-02-06 DIAGNOSIS — I5022 Chronic systolic (congestive) heart failure: Secondary | ICD-10-CM | POA: Diagnosis not present

## 2023-02-06 DIAGNOSIS — I5033 Acute on chronic diastolic (congestive) heart failure: Secondary | ICD-10-CM | POA: Diagnosis not present

## 2023-02-06 DIAGNOSIS — I480 Paroxysmal atrial fibrillation: Secondary | ICD-10-CM | POA: Diagnosis not present

## 2023-02-06 DIAGNOSIS — Z86711 Personal history of pulmonary embolism: Secondary | ICD-10-CM | POA: Diagnosis not present

## 2023-02-07 DIAGNOSIS — Z950 Presence of cardiac pacemaker: Secondary | ICD-10-CM | POA: Diagnosis not present

## 2023-02-07 DIAGNOSIS — E039 Hypothyroidism, unspecified: Secondary | ICD-10-CM | POA: Diagnosis not present

## 2023-02-07 DIAGNOSIS — M1612 Unilateral primary osteoarthritis, left hip: Secondary | ICD-10-CM | POA: Diagnosis not present

## 2023-02-07 DIAGNOSIS — I4892 Unspecified atrial flutter: Secondary | ICD-10-CM | POA: Diagnosis not present

## 2023-02-07 DIAGNOSIS — E1140 Type 2 diabetes mellitus with diabetic neuropathy, unspecified: Secondary | ICD-10-CM | POA: Diagnosis not present

## 2023-02-07 DIAGNOSIS — I5022 Chronic systolic (congestive) heart failure: Secondary | ICD-10-CM | POA: Diagnosis not present

## 2023-02-07 DIAGNOSIS — E1151 Type 2 diabetes mellitus with diabetic peripheral angiopathy without gangrene: Secondary | ICD-10-CM | POA: Diagnosis not present

## 2023-02-07 DIAGNOSIS — I110 Hypertensive heart disease with heart failure: Secondary | ICD-10-CM | POA: Diagnosis not present

## 2023-02-07 DIAGNOSIS — Z8616 Personal history of COVID-19: Secondary | ICD-10-CM | POA: Diagnosis not present

## 2023-02-07 DIAGNOSIS — Z9181 History of falling: Secondary | ICD-10-CM | POA: Diagnosis not present

## 2023-02-07 DIAGNOSIS — I442 Atrioventricular block, complete: Secondary | ICD-10-CM | POA: Diagnosis not present

## 2023-02-07 DIAGNOSIS — I480 Paroxysmal atrial fibrillation: Secondary | ICD-10-CM | POA: Diagnosis not present

## 2023-02-07 DIAGNOSIS — Z86711 Personal history of pulmonary embolism: Secondary | ICD-10-CM | POA: Diagnosis not present

## 2023-02-07 DIAGNOSIS — I872 Venous insufficiency (chronic) (peripheral): Secondary | ICD-10-CM | POA: Diagnosis not present

## 2023-02-07 DIAGNOSIS — I358 Other nonrheumatic aortic valve disorders: Secondary | ICD-10-CM | POA: Diagnosis not present

## 2023-02-07 DIAGNOSIS — K219 Gastro-esophageal reflux disease without esophagitis: Secondary | ICD-10-CM | POA: Diagnosis not present

## 2023-02-07 DIAGNOSIS — I5033 Acute on chronic diastolic (congestive) heart failure: Secondary | ICD-10-CM | POA: Diagnosis not present

## 2023-02-07 DIAGNOSIS — Z792 Long term (current) use of antibiotics: Secondary | ICD-10-CM | POA: Diagnosis not present

## 2023-02-07 DIAGNOSIS — Z7982 Long term (current) use of aspirin: Secondary | ICD-10-CM | POA: Diagnosis not present

## 2023-02-09 ENCOUNTER — Ambulatory Visit (HOSPITAL_BASED_OUTPATIENT_CLINIC_OR_DEPARTMENT_OTHER): Payer: Medicare Other | Admitting: Family

## 2023-02-09 VITALS — BP 128/58 | HR 73 | Ht 61.0 in | Wt 237.8 lb

## 2023-02-09 DIAGNOSIS — I5042 Chronic combined systolic (congestive) and diastolic (congestive) heart failure: Secondary | ICD-10-CM

## 2023-02-09 DIAGNOSIS — I4891 Unspecified atrial fibrillation: Secondary | ICD-10-CM | POA: Diagnosis not present

## 2023-02-09 DIAGNOSIS — Z952 Presence of prosthetic heart valve: Secondary | ICD-10-CM

## 2023-02-09 DIAGNOSIS — E1140 Type 2 diabetes mellitus with diabetic neuropathy, unspecified: Secondary | ICD-10-CM | POA: Diagnosis not present

## 2023-02-09 DIAGNOSIS — Z950 Presence of cardiac pacemaker: Secondary | ICD-10-CM | POA: Diagnosis not present

## 2023-02-09 DIAGNOSIS — K219 Gastro-esophageal reflux disease without esophagitis: Secondary | ICD-10-CM | POA: Diagnosis not present

## 2023-02-09 DIAGNOSIS — Z9181 History of falling: Secondary | ICD-10-CM | POA: Diagnosis not present

## 2023-02-09 DIAGNOSIS — I4892 Unspecified atrial flutter: Secondary | ICD-10-CM | POA: Diagnosis not present

## 2023-02-09 DIAGNOSIS — M1612 Unilateral primary osteoarthritis, left hip: Secondary | ICD-10-CM | POA: Diagnosis not present

## 2023-02-09 DIAGNOSIS — I480 Paroxysmal atrial fibrillation: Secondary | ICD-10-CM | POA: Diagnosis not present

## 2023-02-09 DIAGNOSIS — I358 Other nonrheumatic aortic valve disorders: Secondary | ICD-10-CM | POA: Diagnosis not present

## 2023-02-09 DIAGNOSIS — Z7982 Long term (current) use of aspirin: Secondary | ICD-10-CM | POA: Diagnosis not present

## 2023-02-09 DIAGNOSIS — I442 Atrioventricular block, complete: Secondary | ICD-10-CM | POA: Diagnosis not present

## 2023-02-09 DIAGNOSIS — I110 Hypertensive heart disease with heart failure: Secondary | ICD-10-CM | POA: Diagnosis not present

## 2023-02-09 DIAGNOSIS — E1151 Type 2 diabetes mellitus with diabetic peripheral angiopathy without gangrene: Secondary | ICD-10-CM | POA: Diagnosis not present

## 2023-02-09 DIAGNOSIS — I5022 Chronic systolic (congestive) heart failure: Secondary | ICD-10-CM | POA: Diagnosis not present

## 2023-02-09 DIAGNOSIS — Z8616 Personal history of COVID-19: Secondary | ICD-10-CM | POA: Diagnosis not present

## 2023-02-09 DIAGNOSIS — Z792 Long term (current) use of antibiotics: Secondary | ICD-10-CM | POA: Diagnosis not present

## 2023-02-09 DIAGNOSIS — I5033 Acute on chronic diastolic (congestive) heart failure: Secondary | ICD-10-CM | POA: Diagnosis not present

## 2023-02-09 DIAGNOSIS — E039 Hypothyroidism, unspecified: Secondary | ICD-10-CM | POA: Diagnosis not present

## 2023-02-09 DIAGNOSIS — Z86711 Personal history of pulmonary embolism: Secondary | ICD-10-CM | POA: Diagnosis not present

## 2023-02-09 DIAGNOSIS — I872 Venous insufficiency (chronic) (peripheral): Secondary | ICD-10-CM | POA: Diagnosis not present

## 2023-02-09 NOTE — Patient Instructions (Signed)
Medication Instructions:  Your physician recommends that you continue on your current medications as directed. Please refer to the Current Medication list given to you today.  Take Lasix 1 tablet with breakfast and 1 tablet with lunch for 3 days Take Cleda Daub half tab with breakfast  *If you need a refill on your cardiac medications before your next appointment, please call your pharmacy*   Follow-Up: At [facility], you and your health needs are our priority.  As part of our continuing mission to provide you with exceptional heart care, we have created designated Provider Care Teams.  These Care Teams include your primary Cardiologist (physician) and Advanced Practice Providers (APPs -  Physician Assistants and Nurse Practitioners) who all work together to provide you with the care you need, when you need it.  We recommend signing up for the patient portal called "MyChart".  Sign up information is provided on this After Visit Summary.  MyChart is used to connect with patients for Virtual Visits (Telemedicine).  Patients are able to view lab/test results, encounter notes, upcoming appointments, etc.  Non-urgent messages can be sent to your provider as well.   To learn more about what you can do with MyChart, go to [email or web address].    Your next appointment:   Follow up with Gillian Shields, NP in 2-3 months.  Other Instructions We will call you on Monday to check how you are feeling!

## 2023-02-09 NOTE — Progress Notes (Unsigned)
Cardiology Office Note:  .   Date:  02/09/2023  ID:  Jessica [name], DOB 1933/05/07, MRN [id number] PCP: Tresa Garter, MD  [facility] Providers Cardiologist:  Chilton Si, MD Electrophysiologist:  Lanier Prude, MD { Click to update primary MD,subspecialty MD or APP then REFRESH:1}   History of Present Illness: .   Jessica [name] is a 89 y.o. female ***with a hx of nonobstructive CAD, severe AS s/p TAVR with endocarditis, CHB s/p PPM, atrial fibrillation s/p AV ablation 2019, HTN, HLD, DM2, chronic systolic and diastolic heart failure last seen while hospitalized.   Admitted 04/18/2022 with endocarditis with 3 cm vegetation on TAVR prosthesis by TEE 04/05/2022.  No lead vegetation noted.  Inadvertent finding of AKI in the setting of COVID.  On indefinite oral antibiotics.   Admitted 2/11 - 05/19/2022 with acute on chronic diastolic heart failure.  Diuresed 5.9 L.  On discharge oral Lasix, Jardiance, Entresto, metoprolol, Aldactone.  Recommended for discharge to SNF.   Pleasant lady who lives at home independently. Her niece is present at clinic visit today. Notes her endurance is not back to baseline. She is participating in [facility] PT/OT twice per week. Ambulating with Drake Wuertz at home. Feels strength is improving. Weight at discharge 231lbs with weight today 221 lbs. Has scale at home but has misplaced. Notes she is taking Furosemide 80mg  QD (prescribed as 40mg  QD) and not taking Spironolactone. She notes she has not been taking Eliquis due to concerns regarding cost.    Reports no lightheadedness nor dizziness. Reports mild exertional dyspneaw hich she attributes to increasing her exercising. No orthopnea, PND, edema.   Fell night before last when her feet went out from under her and landed on her knees. No prdrome, no lightheadedness, dizziness. Fire department had to come and get her up.   Gout in foot and arthritis in the knee  Last night walking to the bathroom  felt more short of breath.   INjection in left knee 3 weeks ago with Dr. Lajoyce Corners  Taking her lasix in the morning and half of spironolactone  Weight up 10 pounds from clinic visit Doesn't feel she is stronger getting in her elgs  Concerned about being home alone Had mom's meals ended August ?[name]  ROS: ***  Studies Reviewed: .        *** Risk Assessment/Calculations:   {Does this patient have ATRIAL FIBRILLATION?:[id number]}         Physical Exam:   VS:  BP (!) 128/58   Pulse 73   Ht 5\' 1"  (1.549 m)   Wt 237 lb 12.8 oz (107.9 kg)   SpO2 95%   BMI 44.93 kg/m    Wt Readings from Last 3 Encounters:  02/09/23 237 lb 12.8 oz (107.9 kg)  11/20/22 227 lb (103 kg)  10/13/22 227 lb (103 kg)    GEN: Well nourished, well developed in no acute distress NECK: No JVD; No carotid bruits CARDIAC: ***RRR, no murmurs, rubs, gallops RESPIRATORY:  Clear to auscultation without rales, wheezing or rhonchi  ABDOMEN: Soft, non-tender, non-distended EXTREMITIES:  No edema; No deformity   ASSESSMENT AND PLAN: .   *** Medication management - Niece plans to assist with the following. Already has Entresto patient assistance. Samples and patient assistance paperwork initiated for Jardiance. Not taking Eliquis due to cost concerns. Reiterated CVA risk with atrial fib. Provided samples and patient assistance paperwork. Given number for [facility] and encouraged to contact regarding LIS.    Atrial fib  s/p ablation / Hypercoaguable state - NSR by auscultation. Denies palpitations. Continue Lopressor 25mg  BID, Eliquis 5mg  BID. Denies bleeding complications.  Update CBC. Previously declined [facility]. CHA2DS2-VASc Score = 7 [CHF History: 1, HTN History: 1, Diabetes History: 1, Stroke History: 0, Vascular Disease History: 1, Age Score: 2, Gender Score: 1].  Therefore, the patient's annual risk of stroke is 11.2 %.       Chronic systolic and diastolic heart failure -grossly euvolemic on exam.  She is taking  Lasix 80 mg daily (prescribed 40 mg daily) and not taking spironolactone.  We will update BMP/BNP.  Pending results adjust medical therapy for protection of renal function and optimal control. Low sodium diet, fluid restriction <2L, and daily weights encouraged. Educated to contact our office for weight gain of 2 lbs overnight or 5 lbs in one week.    S/p TAVR / Endocarditis - Completed IV abx now on indefinite PO abx per ID. No lead vegetation per imaging. Per EP, only plan to extract PPM if needs valvular intervention. Is PPM dependent.   CHB s/p PPM - Follows with EP        {Are you ordering a CV Procedure (e.g. stress test, cath, DCCV, TEE, etc)?   Press F2        :[id number]}  Dispo: ***  Signed, Alver Sorrow, NP

## 2023-02-11 ENCOUNTER — Encounter (HOSPITAL_BASED_OUTPATIENT_CLINIC_OR_DEPARTMENT_OTHER): Payer: Self-pay | Admitting: Family

## 2023-02-12 ENCOUNTER — Ambulatory Visit: Payer: Medicare Other | Admitting: Cardiology

## 2023-02-12 ENCOUNTER — Telehealth (HOSPITAL_BASED_OUTPATIENT_CLINIC_OR_DEPARTMENT_OTHER): Payer: Self-pay

## 2023-02-12 DIAGNOSIS — Z9181 History of falling: Secondary | ICD-10-CM | POA: Diagnosis not present

## 2023-02-12 DIAGNOSIS — I442 Atrioventricular block, complete: Secondary | ICD-10-CM | POA: Diagnosis not present

## 2023-02-12 DIAGNOSIS — E1140 Type 2 diabetes mellitus with diabetic neuropathy, unspecified: Secondary | ICD-10-CM | POA: Diagnosis not present

## 2023-02-12 DIAGNOSIS — Z86711 Personal history of pulmonary embolism: Secondary | ICD-10-CM | POA: Diagnosis not present

## 2023-02-12 DIAGNOSIS — I5022 Chronic systolic (congestive) heart failure: Secondary | ICD-10-CM | POA: Diagnosis not present

## 2023-02-12 DIAGNOSIS — M1612 Unilateral primary osteoarthritis, left hip: Secondary | ICD-10-CM | POA: Diagnosis not present

## 2023-02-12 DIAGNOSIS — Z7982 Long term (current) use of aspirin: Secondary | ICD-10-CM | POA: Diagnosis not present

## 2023-02-12 DIAGNOSIS — Z950 Presence of cardiac pacemaker: Secondary | ICD-10-CM | POA: Diagnosis not present

## 2023-02-12 DIAGNOSIS — I358 Other nonrheumatic aortic valve disorders: Secondary | ICD-10-CM | POA: Diagnosis not present

## 2023-02-12 DIAGNOSIS — I110 Hypertensive heart disease with heart failure: Secondary | ICD-10-CM | POA: Diagnosis not present

## 2023-02-12 DIAGNOSIS — I872 Venous insufficiency (chronic) (peripheral): Secondary | ICD-10-CM | POA: Diagnosis not present

## 2023-02-12 DIAGNOSIS — I5033 Acute on chronic diastolic (congestive) heart failure: Secondary | ICD-10-CM | POA: Diagnosis not present

## 2023-02-12 DIAGNOSIS — E1151 Type 2 diabetes mellitus with diabetic peripheral angiopathy without gangrene: Secondary | ICD-10-CM | POA: Diagnosis not present

## 2023-02-12 DIAGNOSIS — I4892 Unspecified atrial flutter: Secondary | ICD-10-CM | POA: Diagnosis not present

## 2023-02-12 DIAGNOSIS — I480 Paroxysmal atrial fibrillation: Secondary | ICD-10-CM | POA: Diagnosis not present

## 2023-02-12 DIAGNOSIS — Z792 Long term (current) use of antibiotics: Secondary | ICD-10-CM | POA: Diagnosis not present

## 2023-02-12 DIAGNOSIS — K219 Gastro-esophageal reflux disease without esophagitis: Secondary | ICD-10-CM | POA: Diagnosis not present

## 2023-02-12 DIAGNOSIS — Z8616 Personal history of COVID-19: Secondary | ICD-10-CM | POA: Diagnosis not present

## 2023-02-12 DIAGNOSIS — E039 Hypothyroidism, unspecified: Secondary | ICD-10-CM | POA: Diagnosis not present

## 2023-02-12 NOTE — Telephone Encounter (Signed)
Called patient.  Patient stated she did not have more SOB episodes during the weekend with med change. Informed patient to call office if any other cardiac symptoms occur. She verbalized understanding and was thankful for call.

## 2023-02-13 ENCOUNTER — Telehealth: Payer: Self-pay

## 2023-02-13 ENCOUNTER — Telehealth (HOSPITAL_BASED_OUTPATIENT_CLINIC_OR_DEPARTMENT_OTHER): Payer: Self-pay | Admitting: Family

## 2023-02-13 NOTE — Patient Outreach (Signed)
  Care Coordination   02/13/2023 Name: MARCEDES SCHEMMEL MRN: [id number] DOB: [birth year]   Care Coordination Outreach Attempts:  An unsuccessful telephone outreach was attempted for a scheduled appointment today.  Follow Up Plan:  Additional outreach attempts will be made to offer the patient care coordination information and services.   Encounter Outcome:  No Answer   Care Coordination Interventions:  No, not indicated    [name], RN, MSN, BSN, CCM Care Management Coordinator [phone]

## 2023-02-13 NOTE — Telephone Encounter (Signed)
Jessica [name] brought Entresto PAP to her 02/09/23 visit but was not complete. She signed in clinic.   Her niece will drop off income information this week.   Will give paperwork to Regis Bill, RN for Dr. Leonides Sake signature on provider sheet and to be faxed when income paperwork received.   Alver Sorrow, NP

## 2023-02-14 ENCOUNTER — Other Ambulatory Visit: Payer: Self-pay | Admitting: Internal Medicine

## 2023-02-15 DIAGNOSIS — E1151 Type 2 diabetes mellitus with diabetic peripheral angiopathy without gangrene: Secondary | ICD-10-CM | POA: Diagnosis not present

## 2023-02-15 DIAGNOSIS — Z950 Presence of cardiac pacemaker: Secondary | ICD-10-CM | POA: Diagnosis not present

## 2023-02-15 DIAGNOSIS — I358 Other nonrheumatic aortic valve disorders: Secondary | ICD-10-CM | POA: Diagnosis not present

## 2023-02-15 DIAGNOSIS — K219 Gastro-esophageal reflux disease without esophagitis: Secondary | ICD-10-CM | POA: Diagnosis not present

## 2023-02-15 DIAGNOSIS — Z8616 Personal history of COVID-19: Secondary | ICD-10-CM | POA: Diagnosis not present

## 2023-02-15 DIAGNOSIS — E039 Hypothyroidism, unspecified: Secondary | ICD-10-CM | POA: Diagnosis not present

## 2023-02-15 DIAGNOSIS — Z9181 History of falling: Secondary | ICD-10-CM | POA: Diagnosis not present

## 2023-02-15 DIAGNOSIS — I5022 Chronic systolic (congestive) heart failure: Secondary | ICD-10-CM | POA: Diagnosis not present

## 2023-02-15 DIAGNOSIS — Z792 Long term (current) use of antibiotics: Secondary | ICD-10-CM | POA: Diagnosis not present

## 2023-02-15 DIAGNOSIS — I480 Paroxysmal atrial fibrillation: Secondary | ICD-10-CM | POA: Diagnosis not present

## 2023-02-15 DIAGNOSIS — I442 Atrioventricular block, complete: Secondary | ICD-10-CM | POA: Diagnosis not present

## 2023-02-15 DIAGNOSIS — I5033 Acute on chronic diastolic (congestive) heart failure: Secondary | ICD-10-CM | POA: Diagnosis not present

## 2023-02-15 DIAGNOSIS — Z7982 Long term (current) use of aspirin: Secondary | ICD-10-CM | POA: Diagnosis not present

## 2023-02-15 DIAGNOSIS — I4892 Unspecified atrial flutter: Secondary | ICD-10-CM | POA: Diagnosis not present

## 2023-02-15 DIAGNOSIS — E1140 Type 2 diabetes mellitus with diabetic neuropathy, unspecified: Secondary | ICD-10-CM | POA: Diagnosis not present

## 2023-02-15 DIAGNOSIS — I110 Hypertensive heart disease with heart failure: Secondary | ICD-10-CM | POA: Diagnosis not present

## 2023-02-15 DIAGNOSIS — M1612 Unilateral primary osteoarthritis, left hip: Secondary | ICD-10-CM | POA: Diagnosis not present

## 2023-02-15 DIAGNOSIS — I872 Venous insufficiency (chronic) (peripheral): Secondary | ICD-10-CM | POA: Diagnosis not present

## 2023-02-15 DIAGNOSIS — Z86711 Personal history of pulmonary embolism: Secondary | ICD-10-CM | POA: Diagnosis not present

## 2023-02-16 DIAGNOSIS — Z86711 Personal history of pulmonary embolism: Secondary | ICD-10-CM | POA: Diagnosis not present

## 2023-02-16 DIAGNOSIS — Z8616 Personal history of COVID-19: Secondary | ICD-10-CM | POA: Diagnosis not present

## 2023-02-16 DIAGNOSIS — Z792 Long term (current) use of antibiotics: Secondary | ICD-10-CM | POA: Diagnosis not present

## 2023-02-16 DIAGNOSIS — Z9181 History of falling: Secondary | ICD-10-CM | POA: Diagnosis not present

## 2023-02-16 DIAGNOSIS — I480 Paroxysmal atrial fibrillation: Secondary | ICD-10-CM | POA: Diagnosis not present

## 2023-02-16 DIAGNOSIS — I5022 Chronic systolic (congestive) heart failure: Secondary | ICD-10-CM | POA: Diagnosis not present

## 2023-02-16 DIAGNOSIS — I358 Other nonrheumatic aortic valve disorders: Secondary | ICD-10-CM | POA: Diagnosis not present

## 2023-02-16 DIAGNOSIS — I442 Atrioventricular block, complete: Secondary | ICD-10-CM | POA: Diagnosis not present

## 2023-02-16 DIAGNOSIS — I110 Hypertensive heart disease with heart failure: Secondary | ICD-10-CM | POA: Diagnosis not present

## 2023-02-16 DIAGNOSIS — I872 Venous insufficiency (chronic) (peripheral): Secondary | ICD-10-CM | POA: Diagnosis not present

## 2023-02-16 DIAGNOSIS — E039 Hypothyroidism, unspecified: Secondary | ICD-10-CM | POA: Diagnosis not present

## 2023-02-16 DIAGNOSIS — M1612 Unilateral primary osteoarthritis, left hip: Secondary | ICD-10-CM | POA: Diagnosis not present

## 2023-02-16 DIAGNOSIS — I4892 Unspecified atrial flutter: Secondary | ICD-10-CM | POA: Diagnosis not present

## 2023-02-16 DIAGNOSIS — Z950 Presence of cardiac pacemaker: Secondary | ICD-10-CM | POA: Diagnosis not present

## 2023-02-16 DIAGNOSIS — K219 Gastro-esophageal reflux disease without esophagitis: Secondary | ICD-10-CM | POA: Diagnosis not present

## 2023-02-16 DIAGNOSIS — E1151 Type 2 diabetes mellitus with diabetic peripheral angiopathy without gangrene: Secondary | ICD-10-CM | POA: Diagnosis not present

## 2023-02-16 DIAGNOSIS — Z7982 Long term (current) use of aspirin: Secondary | ICD-10-CM | POA: Diagnosis not present

## 2023-02-16 DIAGNOSIS — E1140 Type 2 diabetes mellitus with diabetic neuropathy, unspecified: Secondary | ICD-10-CM | POA: Diagnosis not present

## 2023-02-16 DIAGNOSIS — I5033 Acute on chronic diastolic (congestive) heart failure: Secondary | ICD-10-CM | POA: Diagnosis not present

## 2023-02-16 NOTE — Telephone Encounter (Signed)
Faxed patient assistance, confirmation received

## 2023-02-19 ENCOUNTER — Telehealth: Payer: Self-pay | Admitting: Licensed Clinical Social Worker

## 2023-02-19 DIAGNOSIS — Z9181 History of falling: Secondary | ICD-10-CM | POA: Diagnosis not present

## 2023-02-19 DIAGNOSIS — K219 Gastro-esophageal reflux disease without esophagitis: Secondary | ICD-10-CM | POA: Diagnosis not present

## 2023-02-19 DIAGNOSIS — E039 Hypothyroidism, unspecified: Secondary | ICD-10-CM | POA: Diagnosis not present

## 2023-02-19 DIAGNOSIS — I872 Venous insufficiency (chronic) (peripheral): Secondary | ICD-10-CM | POA: Diagnosis not present

## 2023-02-19 DIAGNOSIS — Z792 Long term (current) use of antibiotics: Secondary | ICD-10-CM | POA: Diagnosis not present

## 2023-02-19 DIAGNOSIS — I442 Atrioventricular block, complete: Secondary | ICD-10-CM | POA: Diagnosis not present

## 2023-02-19 DIAGNOSIS — Z86711 Personal history of pulmonary embolism: Secondary | ICD-10-CM | POA: Diagnosis not present

## 2023-02-19 DIAGNOSIS — Z950 Presence of cardiac pacemaker: Secondary | ICD-10-CM | POA: Diagnosis not present

## 2023-02-19 DIAGNOSIS — E1151 Type 2 diabetes mellitus with diabetic peripheral angiopathy without gangrene: Secondary | ICD-10-CM | POA: Diagnosis not present

## 2023-02-19 DIAGNOSIS — Z8616 Personal history of COVID-19: Secondary | ICD-10-CM | POA: Diagnosis not present

## 2023-02-19 DIAGNOSIS — I110 Hypertensive heart disease with heart failure: Secondary | ICD-10-CM | POA: Diagnosis not present

## 2023-02-19 DIAGNOSIS — E1140 Type 2 diabetes mellitus with diabetic neuropathy, unspecified: Secondary | ICD-10-CM | POA: Diagnosis not present

## 2023-02-19 DIAGNOSIS — Z7982 Long term (current) use of aspirin: Secondary | ICD-10-CM | POA: Diagnosis not present

## 2023-02-19 DIAGNOSIS — M1612 Unilateral primary osteoarthritis, left hip: Secondary | ICD-10-CM | POA: Diagnosis not present

## 2023-02-19 DIAGNOSIS — I5022 Chronic systolic (congestive) heart failure: Secondary | ICD-10-CM | POA: Diagnosis not present

## 2023-02-19 DIAGNOSIS — I5033 Acute on chronic diastolic (congestive) heart failure: Secondary | ICD-10-CM | POA: Diagnosis not present

## 2023-02-19 DIAGNOSIS — I4892 Unspecified atrial flutter: Secondary | ICD-10-CM | POA: Diagnosis not present

## 2023-02-19 DIAGNOSIS — I480 Paroxysmal atrial fibrillation: Secondary | ICD-10-CM | POA: Diagnosis not present

## 2023-02-19 DIAGNOSIS — I358 Other nonrheumatic aortic valve disorders: Secondary | ICD-10-CM | POA: Diagnosis not present

## 2023-02-19 NOTE — Telephone Encounter (Signed)
H&V Care Navigation CSW Progress Note  Clinical Social Worker contacted patient by phone to f/u on community resources as needed. Pt had come to [facility] clinic and had previously received [employer]. Active with [facility] Team (previously [facility]). No answer today at [phone]. Voicemail not set up therefore couldn't leave message. Will re-attempt again as able.   Patient is participating in a Managed Medicaid Plan: no, UHC Medicare only  SDOH Screenings   Food Insecurity: No Food Insecurity (11/22/2022)  Housing: Low Risk  (11/22/2022)  Transportation Needs: No Transportation Needs (11/22/2022)  Utilities: Not At Risk (11/22/2022)  Alcohol Screen: Low Risk  (07/04/2022)  Depression (PHQ2-9): Low Risk  (01/12/2023)  Financial Resource Strain: Low Risk  (07/04/2022)  Physical Activity: Inactive (07/04/2022)  Social Connections: Socially Isolated (07/04/2022)  Stress: No Stress Concern Present (07/04/2022)  Tobacco Use: Low Risk  (02/11/2023)    [name], MSW, LCSW Clinical Social Worker II [facility] Heart/Vascular Care Navigation  [phone]- work cell phone (preferred) [phone]- desk phone

## 2023-02-20 ENCOUNTER — Telehealth: Payer: Self-pay | Admitting: Licensed Clinical Social Worker

## 2023-02-20 ENCOUNTER — Ambulatory Visit (INDEPENDENT_AMBULATORY_CARE_PROVIDER_SITE_OTHER): Payer: Medicare Other

## 2023-02-20 DIAGNOSIS — I4892 Unspecified atrial flutter: Secondary | ICD-10-CM | POA: Diagnosis not present

## 2023-02-20 DIAGNOSIS — I5033 Acute on chronic diastolic (congestive) heart failure: Secondary | ICD-10-CM | POA: Diagnosis not present

## 2023-02-20 DIAGNOSIS — I442 Atrioventricular block, complete: Secondary | ICD-10-CM | POA: Diagnosis not present

## 2023-02-20 DIAGNOSIS — E1151 Type 2 diabetes mellitus with diabetic peripheral angiopathy without gangrene: Secondary | ICD-10-CM | POA: Diagnosis not present

## 2023-02-20 DIAGNOSIS — Z7982 Long term (current) use of aspirin: Secondary | ICD-10-CM | POA: Diagnosis not present

## 2023-02-20 DIAGNOSIS — I480 Paroxysmal atrial fibrillation: Secondary | ICD-10-CM | POA: Diagnosis not present

## 2023-02-20 DIAGNOSIS — Z792 Long term (current) use of antibiotics: Secondary | ICD-10-CM | POA: Diagnosis not present

## 2023-02-20 DIAGNOSIS — E1140 Type 2 diabetes mellitus with diabetic neuropathy, unspecified: Secondary | ICD-10-CM | POA: Diagnosis not present

## 2023-02-20 DIAGNOSIS — I4891 Unspecified atrial fibrillation: Secondary | ICD-10-CM

## 2023-02-20 DIAGNOSIS — I872 Venous insufficiency (chronic) (peripheral): Secondary | ICD-10-CM | POA: Diagnosis not present

## 2023-02-20 DIAGNOSIS — Z86711 Personal history of pulmonary embolism: Secondary | ICD-10-CM | POA: Diagnosis not present

## 2023-02-20 DIAGNOSIS — Z9181 History of falling: Secondary | ICD-10-CM | POA: Diagnosis not present

## 2023-02-20 DIAGNOSIS — K219 Gastro-esophageal reflux disease without esophagitis: Secondary | ICD-10-CM | POA: Diagnosis not present

## 2023-02-20 DIAGNOSIS — E039 Hypothyroidism, unspecified: Secondary | ICD-10-CM | POA: Diagnosis not present

## 2023-02-20 DIAGNOSIS — I110 Hypertensive heart disease with heart failure: Secondary | ICD-10-CM | POA: Diagnosis not present

## 2023-02-20 DIAGNOSIS — M1612 Unilateral primary osteoarthritis, left hip: Secondary | ICD-10-CM | POA: Diagnosis not present

## 2023-02-20 DIAGNOSIS — I358 Other nonrheumatic aortic valve disorders: Secondary | ICD-10-CM | POA: Diagnosis not present

## 2023-02-20 DIAGNOSIS — I5022 Chronic systolic (congestive) heart failure: Secondary | ICD-10-CM | POA: Diagnosis not present

## 2023-02-20 DIAGNOSIS — Z950 Presence of cardiac pacemaker: Secondary | ICD-10-CM | POA: Diagnosis not present

## 2023-02-20 DIAGNOSIS — Z8616 Personal history of COVID-19: Secondary | ICD-10-CM | POA: Diagnosis not present

## 2023-02-20 NOTE — Telephone Encounter (Signed)
H&V Care Navigation CSW Progress Note  Clinical Social Worker contacted patient by phone to f/u on community resources as needed. Pt had come to [facility] clinic and had previously received [employer]. Active with [facility] Team (previously [facility]). No answer again today at [phone]. Voicemail not set up. Will re-attempt again as able.    Patient is participating in a Managed Medicaid Plan: no, UHC Medicare only  SDOH Screenings   Food Insecurity: No Food Insecurity (11/22/2022)  Housing: Low Risk  (11/22/2022)  Transportation Needs: No Transportation Needs (11/22/2022)  Utilities: Not At Risk (11/22/2022)  Alcohol Screen: Low Risk  (07/04/2022)  Depression (PHQ2-9): Low Risk  (01/12/2023)  Financial Resource Strain: Low Risk  (07/04/2022)  Physical Activity: Inactive (07/04/2022)  Social Connections: Socially Isolated (07/04/2022)  Stress: No Stress Concern Present (07/04/2022)  Tobacco Use: Low Risk  (02/11/2023)    [name], MSW, LCSW Clinical Social Worker II [facility] Heart/Vascular Care Navigation  [phone]- work cell phone (preferred) [phone]- desk phone

## 2023-02-22 ENCOUNTER — Other Ambulatory Visit: Payer: Self-pay | Admitting: Internal Medicine

## 2023-02-22 NOTE — Telephone Encounter (Signed)
Received information from patient assistance, need financial information and denial from [facility]  Advised niece [name]

## 2023-02-23 LAB — CUP PACEART REMOTE DEVICE CHECK
Battery Impedance: 1908 Ohm
Battery Remaining Longevity: 37 mo
Battery Voltage: 2.75 V
Brady Statistic AP VP Percent: 13 %
Brady Statistic AP VS Percent: 0 %
Brady Statistic AS VP Percent: 87 %
Brady Statistic AS VS Percent: 0 %
Date Time Interrogation Session: 20241121143853
Implantable Lead Connection Status: 753985
Implantable Lead Connection Status: 753985
Implantable Lead Implant Date: 20140710
Implantable Lead Implant Date: 20140710
Implantable Lead Location: 753859
Implantable Lead Location: 753860
Implantable Lead Model: 5092
Implantable Lead Model: 5592
Implantable Pulse Generator Implant Date: 20140710
Lead Channel Impedance Value: 498 Ohm
Lead Channel Impedance Value: 754 Ohm
Lead Channel Pacing Threshold Amplitude: 0.5 V
Lead Channel Pacing Threshold Amplitude: 0.625 V
Lead Channel Pacing Threshold Pulse Width: 0.4 ms
Lead Channel Pacing Threshold Pulse Width: 0.4 ms
Lead Channel Setting Pacing Amplitude: 2 V
Lead Channel Setting Pacing Amplitude: 2.5 V
Lead Channel Setting Pacing Pulse Width: 0.4 ms
Lead Channel Setting Sensing Sensitivity: 4 mV
Zone Setting Status: 755011
Zone Setting Status: 755011

## 2023-02-24 DIAGNOSIS — I872 Venous insufficiency (chronic) (peripheral): Secondary | ICD-10-CM | POA: Diagnosis not present

## 2023-02-24 DIAGNOSIS — I5022 Chronic systolic (congestive) heart failure: Secondary | ICD-10-CM | POA: Diagnosis not present

## 2023-02-24 DIAGNOSIS — I480 Paroxysmal atrial fibrillation: Secondary | ICD-10-CM | POA: Diagnosis not present

## 2023-02-24 DIAGNOSIS — I358 Other nonrheumatic aortic valve disorders: Secondary | ICD-10-CM | POA: Diagnosis not present

## 2023-02-24 DIAGNOSIS — Z7982 Long term (current) use of aspirin: Secondary | ICD-10-CM | POA: Diagnosis not present

## 2023-02-24 DIAGNOSIS — I110 Hypertensive heart disease with heart failure: Secondary | ICD-10-CM | POA: Diagnosis not present

## 2023-02-24 DIAGNOSIS — K219 Gastro-esophageal reflux disease without esophagitis: Secondary | ICD-10-CM | POA: Diagnosis not present

## 2023-02-24 DIAGNOSIS — Z9181 History of falling: Secondary | ICD-10-CM | POA: Diagnosis not present

## 2023-02-24 DIAGNOSIS — Z950 Presence of cardiac pacemaker: Secondary | ICD-10-CM | POA: Diagnosis not present

## 2023-02-24 DIAGNOSIS — E1140 Type 2 diabetes mellitus with diabetic neuropathy, unspecified: Secondary | ICD-10-CM | POA: Diagnosis not present

## 2023-02-24 DIAGNOSIS — I5033 Acute on chronic diastolic (congestive) heart failure: Secondary | ICD-10-CM | POA: Diagnosis not present

## 2023-02-24 DIAGNOSIS — E1151 Type 2 diabetes mellitus with diabetic peripheral angiopathy without gangrene: Secondary | ICD-10-CM | POA: Diagnosis not present

## 2023-02-24 DIAGNOSIS — M1612 Unilateral primary osteoarthritis, left hip: Secondary | ICD-10-CM | POA: Diagnosis not present

## 2023-02-24 DIAGNOSIS — E039 Hypothyroidism, unspecified: Secondary | ICD-10-CM | POA: Diagnosis not present

## 2023-02-24 DIAGNOSIS — Z8616 Personal history of COVID-19: Secondary | ICD-10-CM | POA: Diagnosis not present

## 2023-02-24 DIAGNOSIS — I442 Atrioventricular block, complete: Secondary | ICD-10-CM | POA: Diagnosis not present

## 2023-02-24 DIAGNOSIS — Z792 Long term (current) use of antibiotics: Secondary | ICD-10-CM | POA: Diagnosis not present

## 2023-02-24 DIAGNOSIS — I4892 Unspecified atrial flutter: Secondary | ICD-10-CM | POA: Diagnosis not present

## 2023-02-24 DIAGNOSIS — Z86711 Personal history of pulmonary embolism: Secondary | ICD-10-CM | POA: Diagnosis not present

## 2023-02-26 ENCOUNTER — Ambulatory Visit: Payer: Medicare Other | Admitting: Internal Medicine

## 2023-02-26 ENCOUNTER — Encounter: Payer: Self-pay | Admitting: Internal Medicine

## 2023-02-26 ENCOUNTER — Telehealth: Payer: Self-pay | Admitting: Licensed Clinical Social Worker

## 2023-02-26 VITALS — BP 112/80 | HR 114 | Temp 98.0°F | Ht 61.0 in | Wt 232.0 lb

## 2023-02-26 DIAGNOSIS — E118 Type 2 diabetes mellitus with unspecified complications: Secondary | ICD-10-CM

## 2023-02-26 DIAGNOSIS — Z792 Long term (current) use of antibiotics: Secondary | ICD-10-CM | POA: Diagnosis not present

## 2023-02-26 DIAGNOSIS — G8929 Other chronic pain: Secondary | ICD-10-CM

## 2023-02-26 DIAGNOSIS — M545 Low back pain, unspecified: Secondary | ICD-10-CM | POA: Diagnosis not present

## 2023-02-26 DIAGNOSIS — F419 Anxiety disorder, unspecified: Secondary | ICD-10-CM

## 2023-02-26 DIAGNOSIS — I480 Paroxysmal atrial fibrillation: Secondary | ICD-10-CM | POA: Diagnosis not present

## 2023-02-26 DIAGNOSIS — Z86711 Personal history of pulmonary embolism: Secondary | ICD-10-CM | POA: Diagnosis not present

## 2023-02-26 DIAGNOSIS — E1151 Type 2 diabetes mellitus with diabetic peripheral angiopathy without gangrene: Secondary | ICD-10-CM | POA: Diagnosis not present

## 2023-02-26 DIAGNOSIS — Z950 Presence of cardiac pacemaker: Secondary | ICD-10-CM | POA: Diagnosis not present

## 2023-02-26 DIAGNOSIS — Z9181 History of falling: Secondary | ICD-10-CM | POA: Diagnosis not present

## 2023-02-26 DIAGNOSIS — I442 Atrioventricular block, complete: Secondary | ICD-10-CM | POA: Diagnosis not present

## 2023-02-26 DIAGNOSIS — I872 Venous insufficiency (chronic) (peripheral): Secondary | ICD-10-CM | POA: Diagnosis not present

## 2023-02-26 DIAGNOSIS — I5043 Acute on chronic combined systolic (congestive) and diastolic (congestive) heart failure: Secondary | ICD-10-CM

## 2023-02-26 DIAGNOSIS — I959 Hypotension, unspecified: Secondary | ICD-10-CM

## 2023-02-26 DIAGNOSIS — M1612 Unilateral primary osteoarthritis, left hip: Secondary | ICD-10-CM | POA: Diagnosis not present

## 2023-02-26 DIAGNOSIS — K219 Gastro-esophageal reflux disease without esophagitis: Secondary | ICD-10-CM | POA: Diagnosis not present

## 2023-02-26 DIAGNOSIS — I5022 Chronic systolic (congestive) heart failure: Secondary | ICD-10-CM | POA: Diagnosis not present

## 2023-02-26 DIAGNOSIS — N183 Chronic kidney disease, stage 3 unspecified: Secondary | ICD-10-CM

## 2023-02-26 DIAGNOSIS — E039 Hypothyroidism, unspecified: Secondary | ICD-10-CM | POA: Diagnosis not present

## 2023-02-26 DIAGNOSIS — Z8616 Personal history of COVID-19: Secondary | ICD-10-CM | POA: Diagnosis not present

## 2023-02-26 DIAGNOSIS — Z7982 Long term (current) use of aspirin: Secondary | ICD-10-CM | POA: Diagnosis not present

## 2023-02-26 DIAGNOSIS — I358 Other nonrheumatic aortic valve disorders: Secondary | ICD-10-CM | POA: Diagnosis not present

## 2023-02-26 DIAGNOSIS — I5033 Acute on chronic diastolic (congestive) heart failure: Secondary | ICD-10-CM | POA: Diagnosis not present

## 2023-02-26 DIAGNOSIS — E1140 Type 2 diabetes mellitus with diabetic neuropathy, unspecified: Secondary | ICD-10-CM | POA: Diagnosis not present

## 2023-02-26 DIAGNOSIS — I4892 Unspecified atrial flutter: Secondary | ICD-10-CM | POA: Diagnosis not present

## 2023-02-26 DIAGNOSIS — I110 Hypertensive heart disease with heart failure: Secondary | ICD-10-CM | POA: Diagnosis not present

## 2023-02-26 LAB — COMPREHENSIVE METABOLIC PANEL
ALT: 25 U/L (ref 0–35)
AST: 25 U/L (ref 0–37)
Albumin: 4.3 g/dL (ref 3.5–5.2)
Alkaline Phosphatase: 85 U/L (ref 39–117)
BUN: 23 mg/dL (ref 6–23)
CO2: 24 meq/L (ref 19–32)
Calcium: 10.3 mg/dL (ref 8.4–10.5)
Chloride: 100 meq/L (ref 96–112)
Creatinine, Ser: 1.4 mg/dL — ABNORMAL HIGH (ref 0.40–1.20)
GFR: 33.84 mL/min — ABNORMAL LOW (ref >60.00)
Glucose, Bld: 210 mg/dL — ABNORMAL HIGH (ref 70–99)
Potassium: 4.6 meq/L (ref 3.5–5.1)
Sodium: 136 meq/L (ref 135–145)
Total Bilirubin: 0.5 mg/dL (ref 0.2–1.2)
Total Protein: 8.1 g/dL (ref 6.0–8.3)

## 2023-02-26 LAB — CBC WITH DIFFERENTIAL/PLATELET
Basophils Absolute: 0 10*3/uL (ref 0.0–0.1)
Basophils Relative: 0.5 % (ref 0.0–3.0)
Eosinophils Absolute: 0.1 10*3/uL (ref 0.0–0.7)
Eosinophils Relative: 0.7 % (ref 0.0–5.0)
HCT: 41.7 % (ref 36.0–46.0)
Hemoglobin: 13.8 g/dL (ref 12.0–15.0)
Lymphocytes Relative: 28.3 % (ref 12.0–46.0)
Lymphs Abs: 2.9 10*3/uL (ref 0.7–4.0)
MCHC: 33.1 g/dL (ref 30.0–36.0)
MCV: 93.5 fL (ref 78.0–100.0)
Monocytes Absolute: 1.7 10*3/uL — ABNORMAL HIGH (ref 0.1–1.0)
Monocytes Relative: 16.5 % — ABNORMAL HIGH (ref 3.0–12.0)
Neutro Abs: 5.6 10*3/uL (ref 1.4–7.7)
Neutrophils Relative %: 54 % (ref 43.0–77.0)
Platelets: 184 10*3/uL (ref 150.0–400.0)
RBC: 4.46 Mil/uL (ref 3.87–5.11)
RDW: 13.8 % (ref 11.5–15.5)
WBC: 10.4 10*3/uL (ref 4.0–10.5)

## 2023-02-26 LAB — TSH: TSH: 3.38 u[IU]/mL (ref 0.35–5.50)

## 2023-02-26 MED ORDER — LORAZEPAM 0.5 MG PO TABS: 0.5000 mg | ORAL_TABLET | Freq: Two times a day (BID) | ORAL | 1 refills | Status: DC | PRN

## 2023-02-26 NOTE — Assessment & Plan Note (Signed)
Hydrate well ?Monitor GFR ?

## 2023-02-26 NOTE — Progress Notes (Signed)
Subjective:  Patient ID: Jessica [name], female    DOB: September 30, 1933  Age: 89 y.o. MRN: [id number]  CC: Medical Management of Chronic Issues (3 MNTH F/U, Pt is asking if a new rx can be sent in for her Temazepam for pick up on Wednesday.)   HPI MELAH SCHUPBACH presents for leg weakness, CHF, DM Getting PT at home Using a w/c - weak legs  Outpatient Medications Prior to Visit  Medication Sig Dispense Refill   albuterol (VENTOLIN HFA) 108 (90 Base) MCG/ACT inhaler Inhale 2 puffs into the lungs every 6 (six) hours as needed for wheezing or shortness of breath. 8 g 2   allopurinol (ZYLOPRIM) 100 MG tablet Take 1 tablet (100 mg total) by mouth daily. 45 tablet 2   aspirin EC 81 MG tablet Take 162 mg by mouth daily. Swallow whole.     b complex vitamins tablet Take 1 tablet by mouth daily.     cefadroxil (DURICEF) 500 MG capsule Take 1 capsule (500 mg total) by mouth 2 (two) times daily. 60 capsule 11   Cholecalciferol 1000 UNITS tablet Take 1,000 Units by mouth daily.     CINNAMON PO Take 1 capsule by mouth daily.     empagliflozin (JARDIANCE) 10 MG TABS tablet Take 1 tablet (10 mg total) by mouth daily. 90 tablet 3   ezetimibe (ZETIA) 10 MG tablet Take 10 mg by mouth daily.     furosemide (LASIX) 40 MG tablet TAKE 1 TABLET BY MOUTH TWICE  DAILY 200 tablet 2   levothyroxine (SYNTHROID) 75 MCG tablet TAKE 1 TABLET BY MOUTH DAILY 100 tablet 2   metFORMIN (GLUCOPHAGE) 500 MG tablet TAKE 1 TABLET BY MOUTH TWICE  DAILY WITH MEALS 200 tablet 2   metoprolol succinate (TOPROL-XL) 50 MG 24 hr tablet TAKE 1 TABLET BY MOUTH DAILY  WITH OR IMMEDIATELY FOLLOWING A  MEAL 100 tablet 2   Multiple Vitamin (MULTIVITAMIN) tablet Take 1 tablet by mouth daily.     nystatin powder Apply 1 Application topically daily. Apply to under breast topically every day shift for redness/moisture.     potassium chloride SA (KLOR-CON M) 20 MEQ tablet TAKE 1 TABLET BY MOUTH DAILY 100 tablet 2   repaglinide (PRANDIN) 2 MG  tablet TAKE 2 TABLETS BY MOUTH 3 TIMES  DAILY BEFORE MEALS 600 tablet 2   rosuvastatin (CRESTOR) 10 MG tablet TAKE 1 TABLET BY MOUTH UP TO 3  TIMES WEEKLY AS TOLERATED 43 tablet 2   sacubitril-valsartan (ENTRESTO) 97-103 MG Take 1 tablet by mouth 2 (two) times daily. 180 tablet 3   spironolactone (ALDACTONE) 25 MG tablet Take 0.5 tablets (12.5 mg total) by mouth daily. 100 tablet 3   temazepam (RESTORIL) 15 MG capsule TAKE 1 CAPSULE BY MOUTH EVERY NIGHT AT BEDTIME AS NEEDED 90 capsule 1   venlafaxine XR (EFFEXOR-XR) 75 MG 24 hr capsule TAKE 1 CAPSULE BY MOUTH DAILY  WITH BREAKFAST 100 capsule 2   LORazepam (ATIVAN) 0.5 MG tablet TAKE 1 TABLET BY MOUTH TWICE  DAILY AS NEEDED FOR ANXIETY 180 tablet 1   No facility-administered medications prior to visit.    ROS: Review of Systems  Constitutional:  Negative for activity change, appetite change, chills, fatigue and unexpected weight change.  HENT:  Negative for congestion, mouth sores and sinus pressure.   Eyes:  Negative for visual disturbance.  Respiratory:  Negative for cough and chest tightness.   Gastrointestinal:  Negative for abdominal pain and nausea.  Genitourinary:  Negative  for difficulty urinating, frequency and vaginal pain.  Musculoskeletal:  Negative for back pain and gait problem.  Skin:  Negative for pallor and rash.  Neurological:  Negative for dizziness, tremors, weakness, numbness and headaches.  Psychiatric/Behavioral:  Negative for confusion and sleep disturbance.     Objective:  BP 112/80 (BP Location: Right Arm, Patient Position: Sitting, Cuff Size: Normal)   Pulse (!) 114   Temp 98 F (36.7 C) (Oral)   Ht 5\' 1"  (1.549 m)   Wt 232 lb (105.2 kg)   SpO2 95%   BMI 43.84 kg/m   BP Readings from Last 3 Encounters:  02/26/23 112/80  02/09/23 (!) 128/58  01/12/23 128/74    Wt Readings from Last 3 Encounters:  02/26/23 232 lb (105.2 kg)  02/09/23 237 lb 12.8 oz (107.9 kg)  11/20/22 227 lb (103 kg)     Physical Exam Constitutional:      General: She is not in acute distress.    Appearance: She is well-developed.  HENT:     Head: Normocephalic.     Right Ear: External ear normal.     Left Ear: External ear normal.     Nose: Nose normal.  Eyes:     General:        Right eye: No discharge.        Left eye: No discharge.     Conjunctiva/sclera: Conjunctivae normal.     Pupils: Pupils are equal, round, and reactive to light.  Neck:     Thyroid: No thyromegaly.     Vascular: No JVD.     Trachea: No tracheal deviation.  Cardiovascular:     Rate and Rhythm: Normal rate and regular rhythm.     Heart sounds: Normal heart sounds.  Pulmonary:     Effort: No respiratory distress.     Breath sounds: No stridor. No wheezing.  Abdominal:     General: Bowel sounds are normal. There is no distension.     Palpations: Abdomen is soft. There is no mass.     Tenderness: There is no abdominal tenderness. There is no guarding or rebound.  Musculoskeletal:        General: No tenderness.     Cervical back: Normal range of motion and neck supple. No rigidity.  Lymphadenopathy:     Cervical: No cervical adenopathy.  Skin:    Findings: No erythema or rash.  Neurological:     Cranial Nerves: No cranial nerve deficit.     Motor: No abnormal muscle tone.     Coordination: Coordination normal.     Deep Tendon Reflexes: Reflexes normal.  Psychiatric:        Behavior: Behavior normal.        Thought Content: Thought content normal.        Judgment: Judgment normal.   HR 90s  Lab Results  Component Value Date   WBC 11.6 (H) 01/15/2023   HGB 14.8 01/15/2023   HCT 45.4 (H) 01/15/2023   PLT 219 01/15/2023   GLUCOSE 210 (H) 01/15/2023   CHOL 207 (H) 07/12/2020   TRIG 277 (H) 07/12/2020   HDL 39 (L) 07/12/2020   LDLDIRECT 155.9 09/30/2012   LDLCALC 119 (H) 07/12/2020   ALT 46 (H) 01/15/2023   AST 41 (H) 01/15/2023   NA 135 01/15/2023   K 4.9 01/15/2023   CL 99 01/15/2023   CREATININE  1.26 (H) 01/15/2023   BUN 39 (H) 01/15/2023   CO2 23 01/15/2023   TSH 3.43 08/14/2022  INR 3.9 (H) 04/25/2022   HGBA1C 8.1 (H) 08/14/2022   MICROALBUR 4.5 (H) 12/02/2009    DG Hip Unilat With Pelvis 2-3 Views Left  Result Date: 05/14/2022 CLINICAL DATA:  Cough, weakness, hip pain EXAM: DG HIP (WITH OR WITHOUT PELVIS) 2-3V LEFT COMPARISON:  None Available. FINDINGS: No acute fracture or dislocation. No aggressive osseous lesion. Normal alignment. Mild osteoarthritis of the left hip. Generalized osteopenia. Soft tissue are unremarkable. No radiopaque foreign body or soft tissue emphysema. IMPRESSION: 1. Mild osteoarthritis of the left hip. Given the patient's age and osteopenia, if there is persistent clinical concern for an occult hip fracture, a MRI of the hip is recommended for increased sensitivity. Electronically Signed   By: Elige Ko M.D.   On: 05/14/2022 09:35   DG Chest Port 1 View  Result Date: 05/14/2022 CLINICAL DATA:  Cough, pain EXAM: PORTABLE CHEST 1 VIEW COMPARISON:  04/25/2022 FINDINGS: Bilateral interstitial thickening. Small bilateral pleural effusions. Bibasilar atelectasis. No pneumothorax. Stable cardiomegaly. Prior TAVR. Dual lead cardiac pacemaker. No acute osseous abnormality. IMPRESSION: 1. Findings concerning for CHF. Electronically Signed   By: Elige Ko M.D.   On: 05/14/2022 09:34    Assessment & Plan:   Problem List Items Addressed This Visit     DM type 2, controlled, with complication (HCC) - Primary     She can continue with repaglinide and Jardiance.  We can add  Actos - she is to let me know if she is willing to start using insulin. Check A1c      Relevant Orders   Comprehensive metabolic panel   CBC with Differential/Platelet   TSH   Low back pain    Get a rollator walker; use a w/c In PT      RESOLVED: CHF (congestive heart failure), NYHA class II, acute on chronic, combined (HCC)    Entresto      Relevant Orders   Comprehensive  metabolic panel   CBC with Differential/Platelet   TSH   CRF (chronic renal failure), stage 3 (moderate) (HCC)    Hydrate well  Monitor GFR      Hypotension    Low nl BP         Meds ordered this encounter  Medications   LORazepam (ATIVAN) 0.5 MG tablet    Sig: Take 1 tablet (0.5 mg total) by mouth 2 (two) times daily as needed. for anxiety    Dispense:  180 tablet    Refill:  1      Follow-up: Return in about 3 months (around 05/29/2023) for a follow-up visit.  Sonda Primes, MD

## 2023-02-26 NOTE — Assessment & Plan Note (Signed)
Get a rollator walker; use a w/c In PT

## 2023-02-26 NOTE — Assessment & Plan Note (Signed)
She can continue with repaglinide and Jardiance.  We can add  Actos - she is to let me know if she is willing to start using insulin. Check A1c

## 2023-02-26 NOTE — Patient Instructions (Addendum)
Hydrate well ?Monitor GFR ?

## 2023-02-26 NOTE — Progress Notes (Signed)
Heart and Vascular Care Navigation  02/26/2023  Jessica [name] 01-30-1934 [id number]  Reason for Referral: community resources, aging and lives alone Patient is participating in a Managed Medicaid Plan: No, [employer] (not Medicaid eligible they have tried)  Engaged with patient by telephone for initial visit for Heart and Vascular Care Coordination.                                                                                                   Assessment:                     LCSW received a call back from pt. Introduced self, role, reason for call. Confirmed home address, PCP (has an appt today), currently resides alone with a cat, and uses wheelchair/cane as needed. Pt shares that she has some family members nearby that assist with rides and getting food in the home. Denies any issues with utilities, bills for housing or getting/affording her medications. She shares that her niece is the main support for her (pt had a twin sister that recently passed), and often drives from [place] to assist her with appts etc. She has difficulty walking long distances and has trouble with some of her IADL/ADLs. She has had home health coming to home, would like to stay in her home as long as she can and hasn't actively looked at any additional options. Inquired if she was okay with me calling her niece at this time to discuss further. Pt agreeable.   Called pt niece Jessica [name] and was able to reach her at [phone]. Niece resides in [place], tries her best to get pt what she needs (food, transportation, and support going to appts). Pt has additional family closer to her (daughter in [place]) but per niece's report they provide limited assistance. Pt niece has started to note that pt has ongoing limitations and feels she may be safer in a more supportive living environment. She doesn't feel she has enough support to have complete meals (previous [employer] contract has expired), take care of  home, and has had decline in mobility. Pt has been to SNF twice and didn't like either experience. We had brief conversation about ALF vs SNF and that since pt has been denied for Medicaid due to income (social security and a small pension) she would be responsible for paying for costs. Pt niece interested in additional resources to have this conversation with pt.   Pt niece also interested in how to complete Extra Help/LIS application needed for [employer]) Patient Assistance. I explained how to contact [employer]- to ask for assistance with Extra Help/LIS application and that a letter of determination would come from [employer] in the mail. Pt niece likely would need pt on the phone/all income and asset information. She states understanding.   No additional questions today- shared I would send resources and my card and encouraged her to contact me as needed. Mailed these to her home address at [address] [place], [place], [place], [address].   HRT/VAS Care Coordination     Patients Home  Cardiology Office [facility]  & [facility]   Outpatient Care Team Social Worker   Social Worker Name: [name], [place], [phone]   Living arrangements for the past 2 months Single Family Home   Lives with: Self   Patient Has Concern With Paying Medical Bills No   Does Patient Have Prescription Coverage? Yes   Home Assistive Devices/Equipment Cane (specify quad or straight); Wheelchair   Current home services DME       Social History:                                                                             SDOH Screenings   Food Insecurity: No Food Insecurity (02/26/2023)  Housing: Low Risk  (02/26/2023)  Transportation Needs: Unmet Transportation Needs (02/26/2023)  Utilities: Not At Risk (02/26/2023)  Alcohol Screen: Low Risk  (07/04/2022)  Depression (PHQ2-9): Low Risk  (01/12/2023)  Financial Resource Strain: Low Risk  (02/26/2023)  Physical Activity: Inactive  (07/04/2022)  Social Connections: Socially Isolated (07/04/2022)  Stress: No Stress Concern Present (07/04/2022)  Tobacco Use: Low Risk  (02/11/2023)  Health Literacy: Adequate Health Literacy (02/26/2023)    SDOH Interventions: Financial Resources:  Financial Strain Interventions: Intervention Not Indicated  Food Insecurity:  Food Insecurity Interventions: Other (Comment) (challenges more with prep and access rather than affordability- states family takes her, could use more meal delivery options)  Housing Insecurity:  Housing Interventions: Other (Comment) (able to afford housing/utilities- discussed looking into alternate arrangements such as ALF/SNF)  Transportation:   Transportation Interventions: Patient Resources Dietitian) (will mail niece addtl resources- currently relies on mostly niece to get to appts and out of house)    Other Care Navigation Interventions:     Provided Pharmacy assistance resources  Discussed how to contact [employer] for assistance with Low Income Subsidy/Extra Help application- pt needs determination for [employer] Patient Assistance. May assist with medication cost overall.    Follow-up plan:   LCSW sent pt niece the following resources for pt: my card, transportation resources, [employer] food benefits flyer, [employer] information and clarification on how to utilize them to apply for Extra Help/LIS program, Age with Delorise Shiner flyer, [place] [facility]'s ALF and SNF lists and personal care services packet. I will f/u to ensure it is received.

## 2023-02-26 NOTE — Assessment & Plan Note (Signed)
Low nl BP

## 2023-02-26 NOTE — Assessment & Plan Note (Signed)
Chronic  ?Continue on lorazepam prn ? Potential benefits of a long term prn benzodiazepines  use as well as potential risks  and complications were explained to the patient and were aknowledged. ?

## 2023-02-26 NOTE — Assessment & Plan Note (Signed)
Jessica [name]

## 2023-02-26 NOTE — Telephone Encounter (Signed)
H&V Care Navigation CSW Progress Note  Clinical Social Worker contacted patient by phone to f/u on community resources as needed. Pt had come to [facility] clinic and had previously received [employer]. Active with [facility] Team ([facility]viously [facility]). No answer again today at [phone]. However this time I was able to leave a voicemail. Left voicemail. Remain available should pt return my call.    Patient is participating in a Managed Medicaid Plan: no, [employer] Medicare only  SDOH Screenings   Food Insecurity: No Food Insecurity (11/22/2022)  Housing: Low Risk  (11/22/2022)  Transportation Needs: No Transportation Needs (11/22/2022)  Utilities: Not At Risk (11/22/2022)  Alcohol Screen: Low Risk  (07/04/2022)  Depression (PHQ2-9): Low Risk  (01/12/2023)  Financial Resource Strain: Low Risk  (07/04/2022)  Physical Activity: Inactive (07/04/2022)  Social Connections: Socially Isolated (07/04/2022)  Stress: No Stress Concern Present (07/04/2022)  Tobacco Use: Low Risk  (02/11/2023)   [name], MSW, LCSW Clinical Social Worker II [facility] Heart/Vascular Care Navigation  [phone]- work cell phone (preferred) [phone]- desk phone

## 2023-02-27 ENCOUNTER — Telehealth: Payer: Self-pay | Admitting: Internal Medicine

## 2023-02-27 NOTE — Telephone Encounter (Signed)
[name] called requesting verbal orders for PT 1 week 4 and Home health aid 1 week 3. Best callback number is [phone].

## 2023-02-28 ENCOUNTER — Encounter: Payer: Self-pay | Admitting: Student

## 2023-02-28 ENCOUNTER — Ambulatory Visit: Payer: Medicare Other | Attending: Student | Admitting: Student

## 2023-02-28 VITALS — BP 100/56 | HR 79 | Ht 61.0 in | Wt 232.0 lb

## 2023-02-28 DIAGNOSIS — I110 Hypertensive heart disease with heart failure: Secondary | ICD-10-CM | POA: Diagnosis not present

## 2023-02-28 DIAGNOSIS — Z9181 History of falling: Secondary | ICD-10-CM | POA: Diagnosis not present

## 2023-02-28 DIAGNOSIS — Z952 Presence of prosthetic heart valve: Secondary | ICD-10-CM | POA: Diagnosis not present

## 2023-02-28 DIAGNOSIS — E1140 Type 2 diabetes mellitus with diabetic neuropathy, unspecified: Secondary | ICD-10-CM | POA: Diagnosis not present

## 2023-02-28 DIAGNOSIS — I5042 Chronic combined systolic (congestive) and diastolic (congestive) heart failure: Secondary | ICD-10-CM

## 2023-02-28 DIAGNOSIS — Z7982 Long term (current) use of aspirin: Secondary | ICD-10-CM | POA: Diagnosis not present

## 2023-02-28 DIAGNOSIS — K219 Gastro-esophageal reflux disease without esophagitis: Secondary | ICD-10-CM | POA: Diagnosis not present

## 2023-02-28 DIAGNOSIS — I5033 Acute on chronic diastolic (congestive) heart failure: Secondary | ICD-10-CM | POA: Diagnosis not present

## 2023-02-28 DIAGNOSIS — I5022 Chronic systolic (congestive) heart failure: Secondary | ICD-10-CM | POA: Diagnosis not present

## 2023-02-28 DIAGNOSIS — I4891 Unspecified atrial fibrillation: Secondary | ICD-10-CM

## 2023-02-28 DIAGNOSIS — E1151 Type 2 diabetes mellitus with diabetic peripheral angiopathy without gangrene: Secondary | ICD-10-CM | POA: Diagnosis not present

## 2023-02-28 DIAGNOSIS — M1612 Unilateral primary osteoarthritis, left hip: Secondary | ICD-10-CM | POA: Diagnosis not present

## 2023-02-28 DIAGNOSIS — I442 Atrioventricular block, complete: Secondary | ICD-10-CM

## 2023-02-28 DIAGNOSIS — I480 Paroxysmal atrial fibrillation: Secondary | ICD-10-CM | POA: Diagnosis not present

## 2023-02-28 DIAGNOSIS — E039 Hypothyroidism, unspecified: Secondary | ICD-10-CM | POA: Diagnosis not present

## 2023-02-28 DIAGNOSIS — Z79899 Other long term (current) drug therapy: Secondary | ICD-10-CM | POA: Diagnosis not present

## 2023-02-28 DIAGNOSIS — Z950 Presence of cardiac pacemaker: Secondary | ICD-10-CM | POA: Diagnosis not present

## 2023-02-28 DIAGNOSIS — I358 Other nonrheumatic aortic valve disorders: Secondary | ICD-10-CM | POA: Diagnosis not present

## 2023-02-28 DIAGNOSIS — I4892 Unspecified atrial flutter: Secondary | ICD-10-CM

## 2023-02-28 DIAGNOSIS — Z8616 Personal history of COVID-19: Secondary | ICD-10-CM | POA: Diagnosis not present

## 2023-02-28 DIAGNOSIS — Z86711 Personal history of pulmonary embolism: Secondary | ICD-10-CM | POA: Diagnosis not present

## 2023-02-28 DIAGNOSIS — I872 Venous insufficiency (chronic) (peripheral): Secondary | ICD-10-CM | POA: Diagnosis not present

## 2023-02-28 LAB — CUP PACEART INCLINIC DEVICE CHECK
Battery Impedance: 1908 Ohm
Battery Remaining Longevity: 36 mo
Battery Voltage: 2.76 V
Brady Statistic AP VP Percent: 13 %
Brady Statistic AP VS Percent: 0 %
Brady Statistic AS VP Percent: 87 %
Brady Statistic AS VS Percent: 0 %
Date Time Interrogation Session: 20241127131654
Implantable Lead Connection Status: 753985
Implantable Lead Connection Status: 753985
Implantable Lead Implant Date: 20140710
Implantable Lead Implant Date: 20140710
Implantable Lead Location: 753859
Implantable Lead Location: 753860
Implantable Lead Model: 5092
Implantable Lead Model: 5592
Implantable Pulse Generator Implant Date: 20140710
Lead Channel Impedance Value: 444 Ohm
Lead Channel Impedance Value: 665 Ohm
Lead Channel Pacing Threshold Amplitude: 0.5 V
Lead Channel Pacing Threshold Amplitude: 0.5 V
Lead Channel Pacing Threshold Amplitude: 0.5 V
Lead Channel Pacing Threshold Amplitude: 0.5 V
Lead Channel Pacing Threshold Pulse Width: 0.4 ms
Lead Channel Pacing Threshold Pulse Width: 0.4 ms
Lead Channel Pacing Threshold Pulse Width: 0.4 ms
Lead Channel Pacing Threshold Pulse Width: 0.4 ms
Lead Channel Sensing Intrinsic Amplitude: 1 mV
Lead Channel Setting Pacing Amplitude: 2 V
Lead Channel Setting Pacing Amplitude: 2.5 V
Lead Channel Setting Pacing Pulse Width: 0.4 ms
Lead Channel Setting Sensing Sensitivity: 4 mV
Zone Setting Status: 755011
Zone Setting Status: 755011

## 2023-02-28 NOTE — Progress Notes (Signed)
  Electrophysiology Office Note:   ID:  Jessica [name], DOB 12-03-1933, MRN [id number]  Primary Cardiologist: Chilton Si, MD Electrophysiologist: Lanier Prude, MD      History of Present Illness:   Jessica [name] is a 89 y.o. female with h/o nonobstructive CAD, severe AS s/p TAVR with endocarditis, CHB s/p PPM, atrial fibrillation s/p AV ablation 2019, HTN, HLD, DM2, chronic systolic and diastolic heart failure  seen today for routine electrophysiology followup.   Since last being seen in our clinic the patient reports doing well from a cardiac perspective currently. Overall,  she denies chest pain. No palpitations. Has been off of Eliquis due to cost, and refuses to consider Warfarin due to possible side effects and monitoring.   Review of systems complete and found to be negative unless listed in HPI.   EP Information / Studies Reviewed:    EKG is ordered today. Personal review as below.  EKG Interpretation Date/Time:  Wednesday February 28 2023 12:05:18 EST Ventricular Rate:  73 PR Interval:  164 QRS Duration:  176 QT Interval:  474 QTC Calculation: 522 R Axis:   -79  Text Interpretation: Atrial-sensed ventricular-paced rhythm When compared with ECG of 14-May-2022 07:07, PREVIOUS ECG IS PRESENT Confirmed by Maxine Glenn [phone]) on 02/28/2023 1:15:21 PM    PPM Interrogation-  reviewed in detail today,  See PACEART report.  Device History: Medtronic Dual Chamber PPM implanted 2014 for CHB  Physical Exam:   VS:  BP (!) 100/56   Pulse 79   Ht 5\' 1"  (1.549 m)   Wt 232 lb (105.2 kg)   SpO2 96%   BMI 43.84 kg/m    Wt Readings from Last 3 Encounters:  02/28/23 232 lb (105.2 kg)  02/26/23 232 lb (105.2 kg)  02/09/23 237 lb 12.8 oz (107.9 kg)     GEN: Well nourished, well developed in no acute distress NECK: No JVD; No carotid bruits CARDIAC: Regular rate and rhythm, no murmurs, rubs, gallops RESPIRATORY:  Clear to auscultation without rales, wheezing  or rhonchi  ABDOMEN: Soft, non-tender, non-distended EXTREMITIES:  No edema; No deformity   ASSESSMENT AND PLAN:    CHB s/p Medtronic PPM  Normal PPM function See Pace Art report No changes today  Atrial fibrillation, Paroxysmal Burden 2.7% CHA2DS2/VASc is 9 We discussed OAC at length.  She is not a candidate for Watchman We discussed pradaza as a less expensive alternative to Eliquis or Xarelto ($60-80 a month through goodrx). She initially considered but ultimately declined. She verbalizes understanding of risk of stroke.  She will call if she changes her mind.   Chronic diastolic CHF Volume status stable.   S/p TAVR Endocarditis On indefinite po ABx.  If not valvular candidate,would not be extraction candidate.    Disposition:   Follow up with Dr. Lalla Brothers in 6 months  Signed, Graciella Freer, PA-C

## 2023-02-28 NOTE — Telephone Encounter (Signed)
I was able to speak with [facility] and give her the verbal ok for PT 1x week for 4 and HH 1x a week for 3.

## 2023-02-28 NOTE — Patient Instructions (Signed)
Medication Instructions:  Your physician recommends that you continue on your current medications as directed. Please refer to the Current Medication list given to you today.  *If you need a refill on your cardiac medications before your next appointment, please call your pharmacy*  Lab Work: None ordered If you have labs (blood work) drawn today and your tests are completely normal, you will receive your results only by: MyChart Message (if you have MyChart) OR A paper copy in the mail If you have any lab test that is abnormal or we need to change your treatment, we will call you to review the results  Follow-Up: At [facility], you and your health needs are our priority.  As part of our continuing mission to provide you with exceptional heart care, we have created designated Provider Care Teams.  These Care Teams include your primary Cardiologist (physician) and Advanced Practice Providers (APPs -  Physician Assistants and Nurse Practitioners) who all work together to provide you with the care you need, when you need it.  Your next appointment:   6 month(s)  Provider:   Michael "Andy" Tillery, PA-C  

## 2023-02-28 NOTE — Telephone Encounter (Signed)
Okay.  Thanks.

## 2023-03-05 DIAGNOSIS — I480 Paroxysmal atrial fibrillation: Secondary | ICD-10-CM | POA: Diagnosis not present

## 2023-03-05 DIAGNOSIS — Z952 Presence of prosthetic heart valve: Secondary | ICD-10-CM | POA: Diagnosis not present

## 2023-03-05 DIAGNOSIS — E039 Hypothyroidism, unspecified: Secondary | ICD-10-CM | POA: Diagnosis not present

## 2023-03-05 DIAGNOSIS — Z8616 Personal history of COVID-19: Secondary | ICD-10-CM | POA: Diagnosis not present

## 2023-03-05 DIAGNOSIS — Z950 Presence of cardiac pacemaker: Secondary | ICD-10-CM | POA: Diagnosis not present

## 2023-03-05 DIAGNOSIS — E1140 Type 2 diabetes mellitus with diabetic neuropathy, unspecified: Secondary | ICD-10-CM | POA: Diagnosis not present

## 2023-03-05 DIAGNOSIS — I872 Venous insufficiency (chronic) (peripheral): Secondary | ICD-10-CM | POA: Diagnosis not present

## 2023-03-05 DIAGNOSIS — Z86711 Personal history of pulmonary embolism: Secondary | ICD-10-CM | POA: Diagnosis not present

## 2023-03-05 DIAGNOSIS — I5022 Chronic systolic (congestive) heart failure: Secondary | ICD-10-CM | POA: Diagnosis not present

## 2023-03-05 DIAGNOSIS — Z9181 History of falling: Secondary | ICD-10-CM | POA: Diagnosis not present

## 2023-03-05 DIAGNOSIS — I4892 Unspecified atrial flutter: Secondary | ICD-10-CM | POA: Diagnosis not present

## 2023-03-05 DIAGNOSIS — E1151 Type 2 diabetes mellitus with diabetic peripheral angiopathy without gangrene: Secondary | ICD-10-CM | POA: Diagnosis not present

## 2023-03-05 DIAGNOSIS — I358 Other nonrheumatic aortic valve disorders: Secondary | ICD-10-CM | POA: Diagnosis not present

## 2023-03-05 DIAGNOSIS — K219 Gastro-esophageal reflux disease without esophagitis: Secondary | ICD-10-CM | POA: Diagnosis not present

## 2023-03-05 DIAGNOSIS — Z7982 Long term (current) use of aspirin: Secondary | ICD-10-CM | POA: Diagnosis not present

## 2023-03-05 DIAGNOSIS — I5033 Acute on chronic diastolic (congestive) heart failure: Secondary | ICD-10-CM | POA: Diagnosis not present

## 2023-03-05 DIAGNOSIS — I442 Atrioventricular block, complete: Secondary | ICD-10-CM | POA: Diagnosis not present

## 2023-03-05 DIAGNOSIS — I110 Hypertensive heart disease with heart failure: Secondary | ICD-10-CM | POA: Diagnosis not present

## 2023-03-05 DIAGNOSIS — M1612 Unilateral primary osteoarthritis, left hip: Secondary | ICD-10-CM | POA: Diagnosis not present

## 2023-03-06 ENCOUNTER — Telehealth: Payer: Self-pay | Admitting: Licensed Clinical Social Worker

## 2023-03-06 DIAGNOSIS — M1612 Unilateral primary osteoarthritis, left hip: Secondary | ICD-10-CM | POA: Diagnosis not present

## 2023-03-06 DIAGNOSIS — I480 Paroxysmal atrial fibrillation: Secondary | ICD-10-CM | POA: Diagnosis not present

## 2023-03-06 DIAGNOSIS — I5022 Chronic systolic (congestive) heart failure: Secondary | ICD-10-CM | POA: Diagnosis not present

## 2023-03-06 DIAGNOSIS — I442 Atrioventricular block, complete: Secondary | ICD-10-CM | POA: Diagnosis not present

## 2023-03-06 DIAGNOSIS — Z86711 Personal history of pulmonary embolism: Secondary | ICD-10-CM | POA: Diagnosis not present

## 2023-03-06 DIAGNOSIS — E1151 Type 2 diabetes mellitus with diabetic peripheral angiopathy without gangrene: Secondary | ICD-10-CM | POA: Diagnosis not present

## 2023-03-06 DIAGNOSIS — Z952 Presence of prosthetic heart valve: Secondary | ICD-10-CM | POA: Diagnosis not present

## 2023-03-06 DIAGNOSIS — E039 Hypothyroidism, unspecified: Secondary | ICD-10-CM | POA: Diagnosis not present

## 2023-03-06 DIAGNOSIS — Z9181 History of falling: Secondary | ICD-10-CM | POA: Diagnosis not present

## 2023-03-06 DIAGNOSIS — Z950 Presence of cardiac pacemaker: Secondary | ICD-10-CM | POA: Diagnosis not present

## 2023-03-06 DIAGNOSIS — Z8616 Personal history of COVID-19: Secondary | ICD-10-CM | POA: Diagnosis not present

## 2023-03-06 DIAGNOSIS — I358 Other nonrheumatic aortic valve disorders: Secondary | ICD-10-CM | POA: Diagnosis not present

## 2023-03-06 DIAGNOSIS — I110 Hypertensive heart disease with heart failure: Secondary | ICD-10-CM | POA: Diagnosis not present

## 2023-03-06 DIAGNOSIS — Z7982 Long term (current) use of aspirin: Secondary | ICD-10-CM | POA: Diagnosis not present

## 2023-03-06 DIAGNOSIS — E1140 Type 2 diabetes mellitus with diabetic neuropathy, unspecified: Secondary | ICD-10-CM | POA: Diagnosis not present

## 2023-03-06 DIAGNOSIS — I872 Venous insufficiency (chronic) (peripheral): Secondary | ICD-10-CM | POA: Diagnosis not present

## 2023-03-06 DIAGNOSIS — I5033 Acute on chronic diastolic (congestive) heart failure: Secondary | ICD-10-CM | POA: Diagnosis not present

## 2023-03-06 DIAGNOSIS — K219 Gastro-esophageal reflux disease without esophagitis: Secondary | ICD-10-CM | POA: Diagnosis not present

## 2023-03-06 DIAGNOSIS — I4892 Unspecified atrial flutter: Secondary | ICD-10-CM | POA: Diagnosis not present

## 2023-03-06 NOTE — Telephone Encounter (Signed)
H&V Care Navigation CSW Progress Note  Clinical Social Worker contacted caregiver by phone to f/u on community resources sent. Was able to reach pt niece [name] at [phone] [facility] on file). [name] is at work, confirms she has received the packet but hasn't had time to look through it. Encouraged her to review when good for her and contact me with any questions. Did not get a chance to ask her about [employer] call to see if she has completed Extra Help application for pt. Will re-attempt her next week to see if it is a better time.  Patient is participating in a Managed Medicaid Plan:  No, [employer] only   SDOH Screenings   Food Insecurity: No Food Insecurity (02/26/2023)  Housing: Low Risk  (02/26/2023)  Transportation Needs: Unmet Transportation Needs (02/26/2023)  Utilities: Not At Risk (02/26/2023)  Alcohol Screen: Low Risk  (07/04/2022)  Depression (PHQ2-9): Low Risk  (02/26/2023)  Financial Resource Strain: Low Risk  (02/26/2023)  Physical Activity: Inactive (07/04/2022)  Social Connections: Socially Isolated (07/04/2022)  Stress: No Stress Concern Present (07/04/2022)  Tobacco Use: Low Risk  (02/28/2023)  Health Literacy: Adequate Health Literacy (02/26/2023)   [name], MSW, LCSW Clinical Social Worker II [facility] Heart/Vascular Care Navigation  [phone]- work cell phone (preferred) [phone]- desk phone

## 2023-03-08 ENCOUNTER — Telehealth: Payer: Self-pay | Admitting: Internal Medicine

## 2023-03-08 NOTE — Telephone Encounter (Signed)
Patient's friend called back and states that [employer] said that we need to go on the DME [email or web address]  to order the rollator walker.

## 2023-03-09 ENCOUNTER — Ambulatory Visit: Payer: Self-pay

## 2023-03-09 NOTE — Patient Outreach (Signed)
  Care Coordination   Follow Up Visit Note   03/09/2023 Name: Jessica [name] MRN: [id number] DOB: December 21, 1933  Jessica [name] is a 89 y.o. year old female who sees Plotnikov, Georgina Quint, MD for primary care. I spoke with  Jessica [name] by phone today.  What matters to the patients health and wellness today?  Jessica [name] reports she has been to see cardiologist and Primary provider. She states she has had two 16-ounce bottles of water this morning in an effort to increase hydration. Patient with history of HF. RNCM message provider regarding clarification of how much patient can drink. She is without any specific complaints at this time.  Goals Addressed             This Visit's Progress    Care Coordination-assist with health management       Interventions Today    Flowsheet Row Most Recent Value  Chronic Disease   Chronic disease during today's visit Diabetes, Hypertension (HTN), Congestive Heart Failure (CHF), Chronic Kidney Disease/End Stage Renal Disease (ESRD)  General Interventions   General Interventions Discussed/Reviewed General Interventions Reviewed, Doctor Visits, Communication with  [Evaluation of current treatment plan for health condition and patient's adherence to plan. attempted to call patient twice to relay fluid intake per Primary provider of 2.5L/day-message left.]  Doctor Visits Discussed/Reviewed Doctor Visits Reviewed, PCP, Specialist  PCP/Specialist Visits Compliance with follow-up visit  Communication with PCP/Specialists  [message to Dr. Posey Rea - request to clarify fluid volume limitation. received return message from Dr. Shirl Harris patient can drink 2.5L/day.]  Education Interventions   Education Provided Provided Education  [reviewed patient instructions per office visit with PCP on 02/26/23 and cardiology 02/28/23.]  Provided Verbal Education On When to see the doctor, Medication, Blood Sugar Monitoring  [advised to call provider with health  questions or concers, take medications as prescribed, attend provider visits as scheduled]  Nutrition Interventions   Nutrition Discussed/Reviewed Nutrition Reviewed  Pharmacy Interventions   Pharmacy Dicussed/Reviewed Pharmacy Topics Reviewed  Safety Interventions   Safety Discussed/Reviewed Safety Reviewed            SDOH assessments and interventions completed:  No  Care Coordination Interventions:  Yes, provided   Follow up plan: Follow up call scheduled for 04/04/23    Encounter Outcome:  Patient Visit Completed   [name], RN, MSN, BSN, CCM Care Management Coordinator [phone]

## 2023-03-09 NOTE — Patient Instructions (Addendum)
Visit Information  Thank you for taking time to visit with me today. Please don't hesitate to contact me if I can be of assistance to you.   Following are the goals we discussed today:  Per Primary Care Provider you can drink 2.5L/day.  Continue to take medications as prescribed. Continue to attend provider visits as scheduled Continue to eat healthy, lean meats, vegetables, fruits, avoid saturated and transfats Contact provider with health questions or concerns as needed Monitor for signs/symptoms of HF exacerbation and notify provider with questions or concerns.  Our next appointment is by telephone on 04/09/23 at 1:15 pm  Please call the care guide team at [phone] if you need to cancel or reschedule your appointment.   If you are experiencing a Mental Health or Behavioral Health Crisis or need someone to talk to, please call the Suicide and Crisis Lifeline: 988 call the [place] National Suicide Prevention Lifeline: 941 737 4066 or TTY: (310)459-7720 TTY (216)517-9112) to talk to a trained counselor  [name], RN, MSN, BSN, CCM Care Management Coordinator [phone]

## 2023-03-12 ENCOUNTER — Telehealth: Payer: Self-pay

## 2023-03-12 DIAGNOSIS — E039 Hypothyroidism, unspecified: Secondary | ICD-10-CM | POA: Diagnosis not present

## 2023-03-12 DIAGNOSIS — Z7982 Long term (current) use of aspirin: Secondary | ICD-10-CM | POA: Diagnosis not present

## 2023-03-12 DIAGNOSIS — Z9181 History of falling: Secondary | ICD-10-CM | POA: Diagnosis not present

## 2023-03-12 DIAGNOSIS — M1612 Unilateral primary osteoarthritis, left hip: Secondary | ICD-10-CM | POA: Diagnosis not present

## 2023-03-12 DIAGNOSIS — Z8616 Personal history of COVID-19: Secondary | ICD-10-CM | POA: Diagnosis not present

## 2023-03-12 DIAGNOSIS — Z952 Presence of prosthetic heart valve: Secondary | ICD-10-CM | POA: Diagnosis not present

## 2023-03-12 DIAGNOSIS — I872 Venous insufficiency (chronic) (peripheral): Secondary | ICD-10-CM | POA: Diagnosis not present

## 2023-03-12 DIAGNOSIS — I5033 Acute on chronic diastolic (congestive) heart failure: Secondary | ICD-10-CM | POA: Diagnosis not present

## 2023-03-12 DIAGNOSIS — Z86711 Personal history of pulmonary embolism: Secondary | ICD-10-CM | POA: Diagnosis not present

## 2023-03-12 DIAGNOSIS — K219 Gastro-esophageal reflux disease without esophagitis: Secondary | ICD-10-CM | POA: Diagnosis not present

## 2023-03-12 DIAGNOSIS — E1151 Type 2 diabetes mellitus with diabetic peripheral angiopathy without gangrene: Secondary | ICD-10-CM | POA: Diagnosis not present

## 2023-03-12 DIAGNOSIS — I110 Hypertensive heart disease with heart failure: Secondary | ICD-10-CM | POA: Diagnosis not present

## 2023-03-12 DIAGNOSIS — F419 Anxiety disorder, unspecified: Secondary | ICD-10-CM

## 2023-03-12 DIAGNOSIS — I4892 Unspecified atrial flutter: Secondary | ICD-10-CM | POA: Diagnosis not present

## 2023-03-12 DIAGNOSIS — I442 Atrioventricular block, complete: Secondary | ICD-10-CM | POA: Diagnosis not present

## 2023-03-12 DIAGNOSIS — I5022 Chronic systolic (congestive) heart failure: Secondary | ICD-10-CM | POA: Diagnosis not present

## 2023-03-12 DIAGNOSIS — E1140 Type 2 diabetes mellitus with diabetic neuropathy, unspecified: Secondary | ICD-10-CM | POA: Diagnosis not present

## 2023-03-12 DIAGNOSIS — Z950 Presence of cardiac pacemaker: Secondary | ICD-10-CM | POA: Diagnosis not present

## 2023-03-12 DIAGNOSIS — I480 Paroxysmal atrial fibrillation: Secondary | ICD-10-CM | POA: Diagnosis not present

## 2023-03-12 DIAGNOSIS — I358 Other nonrheumatic aortic valve disorders: Secondary | ICD-10-CM | POA: Diagnosis not present

## 2023-03-12 NOTE — Patient Outreach (Signed)
  Care Coordination   Follow Up Visit Note   03/12/2023 Name: Jessica [name] MRN: [id number] DOB: [birth year]  Jessica [name] is a 89 y.o. year old female who sees Plotnikov, Georgina Quint, MD for primary care. I spoke with  [name] by phone today.  What matters to the patients health and wellness today?  Ms. Ganster confirms that she received message regarding 2.5L fluid limitation. She also expressing discontent with her health status and living situation. She has lost a lot of family members. Has a neice that is supportive-lives in [place] that comes to help her. She has limited mobility and primarily gets around using wheelchair. She states she would like to remain in her home. She is agreeable to discussing options, resources and coping strategies with Child psychotherapist.    Goals Addressed             This Visit's Progress    Care Coordination-assist with health management       Interventions Today    Flowsheet Row Most Recent Value  Chronic Disease   Chronic disease during today's visit Chronic Kidney Disease/End Stage Renal Disease (ESRD), Congestive Heart Failure (CHF), Hypertension (HTN), Diabetes  General Interventions   General Interventions Discussed/Reviewed General Interventions Reviewed  [facility] called to confirm patient received message about fluid limitation of 2.5L per PCP.]  Mental Health Interventions   Mental Health Discussed/Reviewed Coping Strategies, Refer to Social Work for counseling, Refer to Social Work for resources  [expressing loss of a lot of her family members, discontent with living situation and status of her health. but would like to remain in her home. she is agreeable to discussing options and coping strategies with social worker.]  Safety Interventions   Safety Discussed/Reviewed Safety Reviewed            SDOH assessments and interventions completed:  No  Care Coordination Interventions:  Yes, provided   Follow up plan: Follow up  call scheduled for 04/09/23    Encounter Outcome:  Patient Visit Completed   [name], RN, MSN, BSN, CCM Care Management Coordinator [phone]

## 2023-03-12 NOTE — Patient Instructions (Signed)
Visit Information  Thank you for taking time to visit with me today. Please don't hesitate to contact me if I can be of assistance to you.   Following are the goals we discussed today:  Someone will be contacting you to schedule a telephone appointment with a clinical social worker Continue to take medications as prescribed. Continue to attend provider visits as scheduled Continue to eat healthy, lean meats, vegetables, fruits, avoid saturated and transfats Contact provider with health questions or concerns as needed  Our next appointment is by telephone on 04/12/23 at 1:15 pm  Please call the care guide team at [phone] if you need to cancel or reschedule your appointment.   If you are experiencing a Mental Health or Behavioral Health Crisis or need someone to talk to, please call the Suicide and Crisis Lifeline: 988 call the [place] National Suicide Prevention Lifeline: 510-223-6765 or TTY: (571)236-3969 TTY (725) 093-7619) to talk to a trained counselor  [name], RN, MSN, BSN, CCM Care Management Coordinator [phone]

## 2023-03-13 ENCOUNTER — Telehealth: Payer: Self-pay | Admitting: *Deleted

## 2023-03-13 ENCOUNTER — Other Ambulatory Visit (HOSPITAL_BASED_OUTPATIENT_CLINIC_OR_DEPARTMENT_OTHER): Payer: Self-pay | Admitting: Cardiology

## 2023-03-13 DIAGNOSIS — I5022 Chronic systolic (congestive) heart failure: Secondary | ICD-10-CM | POA: Diagnosis not present

## 2023-03-13 DIAGNOSIS — Z9181 History of falling: Secondary | ICD-10-CM | POA: Diagnosis not present

## 2023-03-13 DIAGNOSIS — I442 Atrioventricular block, complete: Secondary | ICD-10-CM | POA: Diagnosis not present

## 2023-03-13 DIAGNOSIS — Z7982 Long term (current) use of aspirin: Secondary | ICD-10-CM | POA: Diagnosis not present

## 2023-03-13 DIAGNOSIS — Z950 Presence of cardiac pacemaker: Secondary | ICD-10-CM | POA: Diagnosis not present

## 2023-03-13 DIAGNOSIS — I872 Venous insufficiency (chronic) (peripheral): Secondary | ICD-10-CM | POA: Diagnosis not present

## 2023-03-13 DIAGNOSIS — M1612 Unilateral primary osteoarthritis, left hip: Secondary | ICD-10-CM | POA: Diagnosis not present

## 2023-03-13 DIAGNOSIS — I358 Other nonrheumatic aortic valve disorders: Secondary | ICD-10-CM | POA: Diagnosis not present

## 2023-03-13 DIAGNOSIS — I5033 Acute on chronic diastolic (congestive) heart failure: Secondary | ICD-10-CM | POA: Diagnosis not present

## 2023-03-13 DIAGNOSIS — I110 Hypertensive heart disease with heart failure: Secondary | ICD-10-CM | POA: Diagnosis not present

## 2023-03-13 DIAGNOSIS — E1140 Type 2 diabetes mellitus with diabetic neuropathy, unspecified: Secondary | ICD-10-CM | POA: Diagnosis not present

## 2023-03-13 DIAGNOSIS — Z952 Presence of prosthetic heart valve: Secondary | ICD-10-CM | POA: Diagnosis not present

## 2023-03-13 DIAGNOSIS — Z86711 Personal history of pulmonary embolism: Secondary | ICD-10-CM | POA: Diagnosis not present

## 2023-03-13 DIAGNOSIS — K219 Gastro-esophageal reflux disease without esophagitis: Secondary | ICD-10-CM | POA: Diagnosis not present

## 2023-03-13 DIAGNOSIS — I480 Paroxysmal atrial fibrillation: Secondary | ICD-10-CM | POA: Diagnosis not present

## 2023-03-13 DIAGNOSIS — Z8616 Personal history of COVID-19: Secondary | ICD-10-CM | POA: Diagnosis not present

## 2023-03-13 DIAGNOSIS — I4892 Unspecified atrial flutter: Secondary | ICD-10-CM | POA: Diagnosis not present

## 2023-03-13 DIAGNOSIS — E039 Hypothyroidism, unspecified: Secondary | ICD-10-CM | POA: Diagnosis not present

## 2023-03-13 DIAGNOSIS — E1151 Type 2 diabetes mellitus with diabetic peripheral angiopathy without gangrene: Secondary | ICD-10-CM | POA: Diagnosis not present

## 2023-03-13 NOTE — Progress Notes (Signed)
  Care Coordination   Note   03/13/2023 Name: MARLE WAYCASTER MRN: [id number] DOB: 06/06/1933  TYRAE PERROTTI is a 89 y.o. year old female who sees Plotnikov, Georgina Quint, MD for primary care. I reached out to [name] by phone today to offer care coordination services.  Ms. Gursky was given information about Care Coordination services today including:   The Care Coordination services include support from the care team which includes your Nurse Coordinator, Clinical Social Worker, or Pharmacist.  The Care Coordination team is here to help remove barriers to the health concerns and goals most important to you. Care Coordination services are voluntary, and the patient may decline or stop services at any time by request to their care team member.   Care Coordination Consent Status: Patient agreed to services and verbal consent obtained.   Follow up plan:  Telephone appointment with care coordination team member scheduled for:  03/14/2023  Encounter Outcome:  Patient Scheduled from referral   Burman Nieves, [facility] Care Coordination Care Guide Direct Dial: [phone]

## 2023-03-14 ENCOUNTER — Ambulatory Visit: Payer: Self-pay | Admitting: Licensed Clinical Social Worker

## 2023-03-14 NOTE — Patient Outreach (Signed)
  Care Coordination   Initial Visit Note   03/14/2023 Name: Jessica [name] MRN: [id number] DOB: 1933-06-28  Jessica [name] is a 89 y.o. year old female who sees Plotnikov, Georgina Quint, MD for primary care. I spoke with  Jessica [name] by phone today.  What matters to the patients health and wellness today? Client has grief issues faced; she has DOE     Goals Addressed             This Visit's Progress    client has grief issues faced; she has DOE       Interventions:  Spoke with client about client needs She said she sees Jessica [name] as PCP Discussed medication procurement for client Discussed ambulation of client.  She said she uses wheelchair mostly to help her ambulate.  She has a ramp at her home she uses to go in and out of her home She has support from her niece who checks on client regularly. Her niece lives in [place]. Client said that her niece often will bing client food when niece visits Provided counseling support for client Discussed program support with RN, LCSW, Pharmacist Client said she has some difficulty occasionally with sleeping Discussed pain issues of client Discussed support of cardiologist Discussed mood of client. Client is grieving loss of her 3 children and her brother.  Her spouse died in 03-25-09 Discussed grief support with Hospice. Discussed bereavement support with Hospice. Encouraged client to call local Hospice and talk with Chaplain or nurse about grief issues she faces. Provided some grief counseling for client Discussed relaxation techniques. She likes to read to help her relax. She wears glasses She said she takes her medications as scheduled Discussed memory issues. She said she has some memory issues. Encouraged client to call LCSW as needed for SW support at [phone] Discussed edema issues. Client said she has no edema issues Client was appreciative of call from LCSW today         SDOH assessments and interventions  completed:  Yes  SDOH Interventions Today    Flowsheet Row Most Recent Value  SDOH Interventions   Depression Interventions/Treatment  Counseling  Physical Activity Interventions Other (Comments)  [uses wheelchair to help her ambulate]  Stress Interventions Other (Comment)  [has stress in managing medical needs]        Care Coordination Interventions:  Yes, provided   Interventions Today    Flowsheet Row Most Recent Value  Chronic Disease   Chronic disease during today's visit Other  [spoke with client about client needs]  General Interventions   General Interventions Discussed/Reviewed General Interventions Discussed, Community Resources  Education Interventions   Education Provided Provided Education  Provided Engineer, [employer] On [employer]  Mental Health Interventions   Mental Health Discussed/Reviewed Coping Strategies  [trying to manage anxiety issues. takes medications as prescribed. has support from her niece]  Nutrition Interventions   Nutrition Discussed/Reviewed Nutrition Discussed  Pharmacy Interventions   Pharmacy Dicussed/Reviewed Pharmacy Topics Discussed        Follow up plan: Follow up call scheduled for 04/02/23 at 9:00 AM     Encounter Outcome:  Patient Visit Completed   Jessica [name].Jessica [name] MSW, LCSW Licensed Visual merchandiser [facility] Care Management [phone]

## 2023-03-14 NOTE — Patient Instructions (Signed)
Visit Information  Thank you for taking time to visit with me today. Please don't hesitate to contact me if I can be of assistance to you.   Following are the goals we discussed today:   Goals Addressed             This Visit's Progress    client has grief issues faced; she has DOE       Interventions:  Spoke with client about client needs She said she sees Dr. Posey Rea as PCP Discussed medication procurement for client Discussed ambulation of client.  She said she uses wheelchair mostly to help her ambulate.  She has a ramp at her home she uses to go in and out of her home She has support from her niece who checks on client regularly. Her niece lives in [place]. Client said that her niece often will bring client food when niece visits Provided counseling support for client Discussed program support with RN, LCSW, Pharmacist Client said she has some difficulty occasionally with sleeping Discussed pain issues of client Discussed support of cardiologist Discussed mood of client. Client is grieving loss of her 3 children and her brother.  Her spouse died in 04-12-2009 Discussed grief support with Hospice. Discussed bereavement support with Hospice. Encouraged client to call local Hospice and talk with Chaplain or nurse about grief issues she faces. Provided some grief counseling for client Discussed relaxation techniques. She likes to read to help her relax. She wears glasses She said she takes her medications as scheduled Discussed memory issues. She said she has some memory issues. Encouraged client to call LCSW as needed for SW support at [phone] Discussed edema issues. Client said she has no edema issues Client was appreciative of call from LCSW today         Our next appointment is by telephone on 04/02/23 at 9:00 AM   Please call the care guide team at [phone] if you need to cancel or reschedule your appointment.   If you are experiencing a Mental Health or  Behavioral Health Crisis or need someone to talk to, please go to [facility] [address] [place] [phone])   The patient verbalized understanding of instructions, educational materials, and care plan provided today and DECLINED offer to receive copy of patient instructions, educational materials, and care plan.   The patient has been provided with contact information for the care management team and has been advised to call with any health related questions or concerns.   Kelton Pillar.Dovey Fatzinger MSW, LCSW Licensed Visual merchandiser [facility] Care Management [phone]

## 2023-03-15 NOTE — Telephone Encounter (Signed)
Patient called back

## 2023-03-15 NOTE — Telephone Encounter (Addendum)
Tried calling pt back phone rang busy and disconnected. We do not have a portal we can place orders in pt chart and fax them over to [facility].

## 2023-03-16 DIAGNOSIS — I442 Atrioventricular block, complete: Secondary | ICD-10-CM | POA: Diagnosis not present

## 2023-03-16 DIAGNOSIS — K219 Gastro-esophageal reflux disease without esophagitis: Secondary | ICD-10-CM | POA: Diagnosis not present

## 2023-03-16 DIAGNOSIS — Z9181 History of falling: Secondary | ICD-10-CM | POA: Diagnosis not present

## 2023-03-16 DIAGNOSIS — I480 Paroxysmal atrial fibrillation: Secondary | ICD-10-CM | POA: Diagnosis not present

## 2023-03-16 DIAGNOSIS — E039 Hypothyroidism, unspecified: Secondary | ICD-10-CM | POA: Diagnosis not present

## 2023-03-16 DIAGNOSIS — I110 Hypertensive heart disease with heart failure: Secondary | ICD-10-CM | POA: Diagnosis not present

## 2023-03-16 DIAGNOSIS — M1612 Unilateral primary osteoarthritis, left hip: Secondary | ICD-10-CM | POA: Diagnosis not present

## 2023-03-16 DIAGNOSIS — I5022 Chronic systolic (congestive) heart failure: Secondary | ICD-10-CM | POA: Diagnosis not present

## 2023-03-16 DIAGNOSIS — E1151 Type 2 diabetes mellitus with diabetic peripheral angiopathy without gangrene: Secondary | ICD-10-CM | POA: Diagnosis not present

## 2023-03-16 DIAGNOSIS — Z8616 Personal history of COVID-19: Secondary | ICD-10-CM | POA: Diagnosis not present

## 2023-03-16 DIAGNOSIS — I4892 Unspecified atrial flutter: Secondary | ICD-10-CM | POA: Diagnosis not present

## 2023-03-16 DIAGNOSIS — Z86711 Personal history of pulmonary embolism: Secondary | ICD-10-CM | POA: Diagnosis not present

## 2023-03-16 DIAGNOSIS — Z952 Presence of prosthetic heart valve: Secondary | ICD-10-CM | POA: Diagnosis not present

## 2023-03-16 DIAGNOSIS — E1140 Type 2 diabetes mellitus with diabetic neuropathy, unspecified: Secondary | ICD-10-CM | POA: Diagnosis not present

## 2023-03-16 DIAGNOSIS — I358 Other nonrheumatic aortic valve disorders: Secondary | ICD-10-CM | POA: Diagnosis not present

## 2023-03-16 DIAGNOSIS — Z7982 Long term (current) use of aspirin: Secondary | ICD-10-CM | POA: Diagnosis not present

## 2023-03-16 DIAGNOSIS — I872 Venous insufficiency (chronic) (peripheral): Secondary | ICD-10-CM | POA: Diagnosis not present

## 2023-03-16 DIAGNOSIS — I5033 Acute on chronic diastolic (congestive) heart failure: Secondary | ICD-10-CM | POA: Diagnosis not present

## 2023-03-16 DIAGNOSIS — Z950 Presence of cardiac pacemaker: Secondary | ICD-10-CM | POA: Diagnosis not present

## 2023-03-19 DIAGNOSIS — I480 Paroxysmal atrial fibrillation: Secondary | ICD-10-CM | POA: Diagnosis not present

## 2023-03-19 DIAGNOSIS — Z8616 Personal history of COVID-19: Secondary | ICD-10-CM | POA: Diagnosis not present

## 2023-03-19 DIAGNOSIS — E039 Hypothyroidism, unspecified: Secondary | ICD-10-CM | POA: Diagnosis not present

## 2023-03-19 DIAGNOSIS — Z950 Presence of cardiac pacemaker: Secondary | ICD-10-CM | POA: Diagnosis not present

## 2023-03-19 DIAGNOSIS — Z9181 History of falling: Secondary | ICD-10-CM | POA: Diagnosis not present

## 2023-03-19 DIAGNOSIS — E1140 Type 2 diabetes mellitus with diabetic neuropathy, unspecified: Secondary | ICD-10-CM | POA: Diagnosis not present

## 2023-03-19 DIAGNOSIS — I4892 Unspecified atrial flutter: Secondary | ICD-10-CM | POA: Diagnosis not present

## 2023-03-19 DIAGNOSIS — I110 Hypertensive heart disease with heart failure: Secondary | ICD-10-CM | POA: Diagnosis not present

## 2023-03-19 DIAGNOSIS — Z86711 Personal history of pulmonary embolism: Secondary | ICD-10-CM | POA: Diagnosis not present

## 2023-03-19 DIAGNOSIS — I442 Atrioventricular block, complete: Secondary | ICD-10-CM | POA: Diagnosis not present

## 2023-03-19 DIAGNOSIS — Z952 Presence of prosthetic heart valve: Secondary | ICD-10-CM | POA: Diagnosis not present

## 2023-03-19 DIAGNOSIS — I5033 Acute on chronic diastolic (congestive) heart failure: Secondary | ICD-10-CM | POA: Diagnosis not present

## 2023-03-19 DIAGNOSIS — I358 Other nonrheumatic aortic valve disorders: Secondary | ICD-10-CM | POA: Diagnosis not present

## 2023-03-19 DIAGNOSIS — I872 Venous insufficiency (chronic) (peripheral): Secondary | ICD-10-CM | POA: Diagnosis not present

## 2023-03-19 DIAGNOSIS — Z7982 Long term (current) use of aspirin: Secondary | ICD-10-CM | POA: Diagnosis not present

## 2023-03-19 DIAGNOSIS — I5022 Chronic systolic (congestive) heart failure: Secondary | ICD-10-CM | POA: Diagnosis not present

## 2023-03-19 DIAGNOSIS — E1151 Type 2 diabetes mellitus with diabetic peripheral angiopathy without gangrene: Secondary | ICD-10-CM | POA: Diagnosis not present

## 2023-03-19 DIAGNOSIS — K219 Gastro-esophageal reflux disease without esophagitis: Secondary | ICD-10-CM | POA: Diagnosis not present

## 2023-03-19 DIAGNOSIS — M1612 Unilateral primary osteoarthritis, left hip: Secondary | ICD-10-CM | POA: Diagnosis not present

## 2023-03-19 NOTE — Addendum Note (Signed)
Addended by: Geralyn Flash D on: 03/19/2023 02:40 PM   Modules accepted: Orders

## 2023-03-19 NOTE — Progress Notes (Signed)
Remote pacemaker transmission.   

## 2023-03-20 DIAGNOSIS — I442 Atrioventricular block, complete: Secondary | ICD-10-CM | POA: Diagnosis not present

## 2023-03-20 DIAGNOSIS — Z86711 Personal history of pulmonary embolism: Secondary | ICD-10-CM | POA: Diagnosis not present

## 2023-03-20 DIAGNOSIS — M1612 Unilateral primary osteoarthritis, left hip: Secondary | ICD-10-CM | POA: Diagnosis not present

## 2023-03-20 DIAGNOSIS — Z9181 History of falling: Secondary | ICD-10-CM | POA: Diagnosis not present

## 2023-03-20 DIAGNOSIS — I358 Other nonrheumatic aortic valve disorders: Secondary | ICD-10-CM | POA: Diagnosis not present

## 2023-03-20 DIAGNOSIS — I5022 Chronic systolic (congestive) heart failure: Secondary | ICD-10-CM | POA: Diagnosis not present

## 2023-03-20 DIAGNOSIS — E1140 Type 2 diabetes mellitus with diabetic neuropathy, unspecified: Secondary | ICD-10-CM | POA: Diagnosis not present

## 2023-03-20 DIAGNOSIS — Z7982 Long term (current) use of aspirin: Secondary | ICD-10-CM | POA: Diagnosis not present

## 2023-03-20 DIAGNOSIS — I872 Venous insufficiency (chronic) (peripheral): Secondary | ICD-10-CM | POA: Diagnosis not present

## 2023-03-20 DIAGNOSIS — Z8616 Personal history of COVID-19: Secondary | ICD-10-CM | POA: Diagnosis not present

## 2023-03-20 DIAGNOSIS — Z950 Presence of cardiac pacemaker: Secondary | ICD-10-CM | POA: Diagnosis not present

## 2023-03-20 DIAGNOSIS — I110 Hypertensive heart disease with heart failure: Secondary | ICD-10-CM | POA: Diagnosis not present

## 2023-03-20 DIAGNOSIS — K219 Gastro-esophageal reflux disease without esophagitis: Secondary | ICD-10-CM | POA: Diagnosis not present

## 2023-03-20 DIAGNOSIS — I5033 Acute on chronic diastolic (congestive) heart failure: Secondary | ICD-10-CM | POA: Diagnosis not present

## 2023-03-20 DIAGNOSIS — I4892 Unspecified atrial flutter: Secondary | ICD-10-CM | POA: Diagnosis not present

## 2023-03-20 DIAGNOSIS — E1151 Type 2 diabetes mellitus with diabetic peripheral angiopathy without gangrene: Secondary | ICD-10-CM | POA: Diagnosis not present

## 2023-03-20 DIAGNOSIS — I480 Paroxysmal atrial fibrillation: Secondary | ICD-10-CM | POA: Diagnosis not present

## 2023-03-20 DIAGNOSIS — E039 Hypothyroidism, unspecified: Secondary | ICD-10-CM | POA: Diagnosis not present

## 2023-03-20 DIAGNOSIS — Z952 Presence of prosthetic heart valve: Secondary | ICD-10-CM | POA: Diagnosis not present

## 2023-03-21 ENCOUNTER — Telehealth: Payer: Self-pay

## 2023-03-21 NOTE — Telephone Encounter (Signed)
Copied from CRM [id number]. Topic: Clinical - Prescription Issue >> Mar 21, 2023  1:50 PM Fuller Mandril wrote: Reason for CRM: Pharmacy called to request refill on Rx for ezetimibe (ZETIA) 10 MG tablet. Advised ordered 12/10. They have the order but they filled for 100 days instead of 90 so in 90 days when the refill is due the next fill will be short 10 tablets. Per caller in the future can write for 100 days with 1 refill.

## 2023-03-22 NOTE — Telephone Encounter (Signed)
Okay with me. Thanks 

## 2023-03-23 ENCOUNTER — Telehealth (HOSPITAL_BASED_OUTPATIENT_CLINIC_OR_DEPARTMENT_OTHER): Payer: Self-pay | Admitting: Licensed Clinical Social Worker

## 2023-03-23 DIAGNOSIS — Z7982 Long term (current) use of aspirin: Secondary | ICD-10-CM | POA: Diagnosis not present

## 2023-03-23 DIAGNOSIS — Z952 Presence of prosthetic heart valve: Secondary | ICD-10-CM | POA: Diagnosis not present

## 2023-03-23 DIAGNOSIS — I110 Hypertensive heart disease with heart failure: Secondary | ICD-10-CM | POA: Diagnosis not present

## 2023-03-23 DIAGNOSIS — Z8616 Personal history of COVID-19: Secondary | ICD-10-CM | POA: Diagnosis not present

## 2023-03-23 DIAGNOSIS — I480 Paroxysmal atrial fibrillation: Secondary | ICD-10-CM | POA: Diagnosis not present

## 2023-03-23 DIAGNOSIS — I872 Venous insufficiency (chronic) (peripheral): Secondary | ICD-10-CM | POA: Diagnosis not present

## 2023-03-23 DIAGNOSIS — E1140 Type 2 diabetes mellitus with diabetic neuropathy, unspecified: Secondary | ICD-10-CM | POA: Diagnosis not present

## 2023-03-23 DIAGNOSIS — E039 Hypothyroidism, unspecified: Secondary | ICD-10-CM | POA: Diagnosis not present

## 2023-03-23 DIAGNOSIS — E1151 Type 2 diabetes mellitus with diabetic peripheral angiopathy without gangrene: Secondary | ICD-10-CM | POA: Diagnosis not present

## 2023-03-23 DIAGNOSIS — M1612 Unilateral primary osteoarthritis, left hip: Secondary | ICD-10-CM | POA: Diagnosis not present

## 2023-03-23 DIAGNOSIS — K219 Gastro-esophageal reflux disease without esophagitis: Secondary | ICD-10-CM | POA: Diagnosis not present

## 2023-03-23 DIAGNOSIS — I5033 Acute on chronic diastolic (congestive) heart failure: Secondary | ICD-10-CM | POA: Diagnosis not present

## 2023-03-23 DIAGNOSIS — Z950 Presence of cardiac pacemaker: Secondary | ICD-10-CM | POA: Diagnosis not present

## 2023-03-23 DIAGNOSIS — Z9181 History of falling: Secondary | ICD-10-CM | POA: Diagnosis not present

## 2023-03-23 DIAGNOSIS — I4892 Unspecified atrial flutter: Secondary | ICD-10-CM | POA: Diagnosis not present

## 2023-03-23 DIAGNOSIS — I5022 Chronic systolic (congestive) heart failure: Secondary | ICD-10-CM | POA: Diagnosis not present

## 2023-03-23 DIAGNOSIS — Z86711 Personal history of pulmonary embolism: Secondary | ICD-10-CM | POA: Diagnosis not present

## 2023-03-23 DIAGNOSIS — I442 Atrioventricular block, complete: Secondary | ICD-10-CM | POA: Diagnosis not present

## 2023-03-23 DIAGNOSIS — I358 Other nonrheumatic aortic valve disorders: Secondary | ICD-10-CM | POA: Diagnosis not present

## 2023-03-23 NOTE — Telephone Encounter (Signed)
H&V Care Navigation CSW Progress Note  Clinical Social Worker contacted caregiver by phone (pt niece [name], [place] on file, [phone]) to f/u again on resources. Pt niece and her husband answered. They are on their way to [place] with supplies for residents there. She confirms she has received packet but states "theres nothing they can get help with." I inquired what items specifically she felt they could not get help with. Pt niece notes that pt daughter in her opinion still doesn't provide assistance. Pt diet consists of the items they bring when they visit, sandwiches and soups. She feels she needs more "meat and potatoes," type of foods in her diet. She also feels pt needs assistance with bathing. She notes Home Health has stopped visiting.   LCSW inquired if pt niece has spoken with pt again about the potential that home is not currently the best/safest option for pt unless they are able to get assistance in the home. Pt niece has not, shares with work and the other things on her list she hasn't gotten over to see pt. LCSW again encouraged her to begin that conversation again with pt and pt daughter since niece is HCPOA but pt doesn't have POA/is still able to make decisions for herself. LCSW acknowledged that care and home and ALF/SNF care is expensive but if pt cannot function at home by herself we want to encourage her to consider other options. I shared I would also f/u with LCSW who has upcoming call with pt on 12/30 to see if he can speak with her about this as well.   Provided verbal support for pt niece's frustration but attempted to encourage her to do what she can to provide pt with her choices and if she continues to choose to stay at home . I will f/u with [facility] colleague to see if any additional recommendations.   Patient is participating in a Managed Medicaid Plan:  no, [employer] only  SDOH Screenings   Food Insecurity: No Food Insecurity (02/26/2023)  Housing: Low  Risk  (02/26/2023)  Transportation Needs: Unmet Transportation Needs (02/26/2023)  Utilities: Not At Risk (02/26/2023)  Alcohol Screen: Low Risk  (07/04/2022)  Depression (PHQ2-9): Medium Risk (03/14/2023)  Financial Resource Strain: Low Risk  (02/26/2023)  Physical Activity: Inactive (03/14/2023)  Social Connections: Socially Isolated (07/04/2022)  Stress: Stress Concern Present (03/14/2023)  Tobacco Use: Low Risk  (02/28/2023)  Health Literacy: Adequate Health Literacy (02/26/2023)    [name], MSW, LCSW Clinical Social Worker [facility] Heart/Vascular Care Navigation  [phone]- work cell phone (preferred) [phone]- desk phone

## 2023-03-27 ENCOUNTER — Ambulatory Visit: Payer: Self-pay | Admitting: Licensed Clinical Social Worker

## 2023-03-27 NOTE — Patient Outreach (Signed)
  Care Coordination   Follow Up Visit Note   03/27/2023 Name: Jessica [name] MRN: [id number] DOB: [birth year]  Jessica [name] is a 89 y.o. year old female who sees Plotnikov, Georgina Quint, MD for primary care. I spoke with  [name] / [name], niece and contact for client, via phone today.  What matters to the patients health and wellness today?  Client has grief issues faced; she has DOE     Goals Addressed             This Visit's Progress    client has grief issues faced; she has DOE       Interventions:  Spoke with [name], niece and contact for client, about client needs Client was receiving Mom's Meals but no longer gets Mom's Meals. LCSW talked with [name] about [employer].  LCSW encouraged [name] and client to call [facility] representative at [employer] number on client insurance card, to talk with representative about frozen meal delivery through [facility].  [name] said after holidays she will try to call [facility] rep to talk with rep about frozen meal delivery support possibly for client help [name] resides in [place], [place] and is trying to offer support for client. [name] does bring client meals from time to time and this is helpful to client. Client and [name] have been provided with information on community resources for client by LCSW [name]).   Client sees Dr. Posey Rea as PCP Discussed medication procurement for client Discussed program support with RN, LCSW, Pharmacist [name] said she would like to speak more with LCSW [name] in January of 2025 to discuss ongoing client needs.   Encouraged [name] or client to call LCSW as needed for SW support  for client at [phone]          SDOH assessments and interventions completed:  Yes  SDOH Interventions Today    Flowsheet Row Most Recent Value  SDOH Interventions   Depression Interventions/Treatment  Counseling  Physical Activity Interventions Other (Comments)  [decreased activity]   Stress Interventions Other (Comment)  [has stress in managing daily needs]        Care Coordination Interventions:  Yes, provided    Interventions Today    Flowsheet Row Most Recent Value  Chronic Disease   Chronic disease during today's visit Other  [spoke with [name], niece of client and contact for client about client needs]  General Interventions   General Interventions Discussed/Reviewed General Interventions Discussed, Community Resources  Education Interventions   Education Provided Provided Education  Provided Verbal Education On [employer]  Mental Health Interventions   Mental Health Discussed/Reviewed Coping Strategies  [no mood issues noted]  Nutrition Interventions   Nutrition Discussed/Reviewed Nutrition Discussed  [name], sometimes brings meals for client to eat]  Pharmacy Interventions   Pharmacy Dicussed/Reviewed Pharmacy Topics Discussed       Follow up plan: Follow up call scheduled for 05/01/23 at 10:30 AM    Encounter Outcome:  Patient Visit Completed   Kelton Pillar.Idalie Canto MSW, LCSW Licensed Visual merchandiser [facility] Care Management [phone]

## 2023-03-27 NOTE — Patient Instructions (Signed)
Visit Information  Thank you for taking time to visit with me today. Please don't hesitate to contact me if I can be of assistance to you.   Following are the goals we discussed today:   Goals Addressed             This Visit's Progress    client has grief issues faced; she has DOE       Interventions:  Spoke with [name], niece and contact for client, about client needs Client was receiving Mom's Meals but no longer gets Mom's Meals. LCSW talked with [name] about [employer].  LCSW encouraged [name] and client to call [facility] representative at [employer] number on client insurance card, to talk with representative about frozen meal delivery through [facility].  [name] said after holidays she will try to call [facility] rep to talk with rep about frozen meal delivery support possibly for client help [name] resides in [place], [place] and is trying to offer support for client. [name] does bring client meals from time to time and this is helpful to client. Client and [name] have been provided with information on community resources for client by LCSW [name]).   Client sees Dr. Posey Rea as PCP Discussed medication procurement for client Discussed program support with RN, LCSW, Pharmacist [name] said she would like to speak more with LCSW [name] in January of 2025 to discuss ongoing client needs.   Encouraged [name] or client to call LCSW as needed for SW support  for client at [phone]          Our next appointment is by telephone on 05/01/23 at 10:30 AM   Please call the care guide team at [phone] if you need to cancel or reschedule your appointment.   If you are experiencing a Mental Health or Behavioral Health Crisis or need someone to talk to, please go to [facility] [address] [place] [phone])   The patient / [name], niece and contact, verbalized understanding of instructions, educational materials, and care  plan provided today and DECLINED offer to receive copy of patient instructions, educational materials, and care plan.   The patient / [name], niece and contact, has been provided with contact information for the care management team and has been advised to call with any health related questions or concerns.    Kelton Pillar.Ala Kratz MSW, LCSW Licensed Visual merchandiser [facility] Care Management [phone]

## 2023-04-02 ENCOUNTER — Ambulatory Visit: Payer: Self-pay | Admitting: Licensed Clinical Social Worker

## 2023-04-02 NOTE — Patient Instructions (Signed)
Visit Information  Thank you for taking time to visit with me today. Please don't hesitate to contact me if I can be of assistance to you.   Following are the goals we discussed today:   Goals Addressed             This Visit's Progress    client has grief issues faced; she has DOE       Interventions:  Spoke with client via phone today about client needs [name], niece and contact for client,  provides some support for client Client was receiving Mom's Meals but no longer gets Mom's Meals. LCSW talked with client about [facility] insurance and encouraged her to call [facility] and talk with insurance  representative about insurance benefits possibly for help with frozen meals for client [name], client niece and contact,  resides in [place], [place] and is trying to offer support for client. [name] does bring client meals from time to time and this is helpful to client. Client said that her niece, [name] is planning to visit client this Friday Client and [name] have been provided with information on community resources for client by LCSW [name]).   Client sees Dr. Posey Rea as PCP. Client said she has appointment with PCP in February of 2025 Discussed medication procurement for client Discussed program support with RN, LCSW, Pharmacist Discussed pain issues of client . Client spoke of pain in her left knee Provided counseling support for client Discussed client vision. She wears glasses. Client said she may need to schedule another eye exam appointment.    Discussed grief issues of client. Reminded client of support available through [facility].  Spoke with client about grief support/bereavement support with [facility] Discussed client ambulation. Client uses wheelchair to help her ambulate Client said she is no longer getting physical therapy support in the home. She is no longer getting nursing support in the home.  Informed client that RN [name] is scheduled to call  client on 04/09/23 at 1:15 PM. Client was glad that RN is scheduled to call client on 04/09/23. Encouraged  client or niece, [name], to call LCSW as needed for SW support  for client at [phone] Client appreciative of call from LCSW today          Our next appointment is by telephone on 04/30/23 at 9:30 AM   Please call the care guide team at [phone] if you need to cancel or reschedule your appointment.   If you are experiencing a Mental Health or Behavioral Health Crisis or need someone to talk to, please go to [facility] [address] [place] [phone])   The patient verbalized understanding of instructions, educational materials, and care plan provided today and DECLINED offer to receive copy of patient instructions, educational materials, and care plan.   The patient has been provided with contact information for the care management team and has been advised to call with any health related questions or concerns.   Kelton Pillar.Diannah Rindfleisch MSW, LCSW Licensed Visual merchandiser [facility] Care Management [phone]

## 2023-04-02 NOTE — Patient Outreach (Signed)
Care Coordination   Follow Up Visit Note   04/02/2023 Name: Jessica [name] MRN: [id number] DOB: 11-Jul-1933  Jessica [name] is a 89 y.o. year old female who sees [name], Jessica Quint, MD for primary care. I spoke with  Jessica [name] by phone today.  What matters to the patients health and wellness today?  Client has grief issues faced; she has DOE    Goals Addressed             This Visit's Progress    client has grief issues faced; she has DOE       Interventions:  Spoke with client via phone today about client needs Jessica [name], niece and contact for client,  provides some support for client Client was receiving Mom's Meals but no longer gets Mom's Meals. LCSW talked with client about [facility] insurance and encouraged her to call [facility] and talk with insurance  representative about insurance benefits possibly for help with frozen meals for client Jessica [name], client niece and contact,  resides in [place], [place] and is trying to offer support for client. Jessica [name] does bring client meals from time to time and this is helpful to client. Client said that her niece, Jessica [name] is planning to visit client this Friday Client and Jessica [name] have been provided with information on community resources for client by LCSW Jessica [name]).   Client sees Dr. Posey [name] as PCP. Client said she has appointment with PCP in February of 2025 Discussed medication procurement for client Discussed program support with RN, LCSW, Pharmacist Discussed pain issues of client . Client spoke of pain in her left knee Provided counseling support for client Discussed client vision. She wears glasses. Client said she may need to schedule another eye exam appointment.    Discussed grief issues of client. Reminded client of support available through [facility].  Spoke with client about grief support/bereavement support with [facility] Discussed client ambulation. Client uses wheelchair to help her  ambulate Client said she is no longer getting physical therapy support in the home. She is no longer getting nursing support in the home.  Informed client that RN Jessica [name] is scheduled to call client on 04/09/23 at 1:15 PM. Client was glad that RN is scheduled to call client on 04/09/23. Encouraged  client or niece, Jessica [name], to call LCSW as needed for SW support  for client at [phone] Client appreciative of call from LCSW today          SDOH assessments and interventions completed:  Yes  SDOH Interventions Today    Flowsheet Row Most Recent Value  SDOH Interventions   Depression Interventions/Treatment  Counseling, Medication  Physical Activity Interventions Other (Comments)  [uses wheelchair for ambulation]  Stress Interventions Provide Counseling  [experieces grief issues. may need some help with ADLs. has stress in managing medical needs]        Care Coordination Interventions:  Yes, provided   Interventions Today    Flowsheet Row Most Recent Value  Chronic Disease   Chronic disease during today's visit Other  [spoke with client about client needs]  General Interventions   General Interventions Discussed/Reviewed General Interventions Discussed, Community Resources  Education Interventions   Education Provided Provided Education  Provided Engineer, [employer] On [employer]  Mental Health Interventions   Mental Health Discussed/Reviewed Coping Strategies  [faces ongoing grief issues. she is trying to use coping skills to manage grief issues. Spoke with client about support with Hospice agency]  Nutrition Interventions  Nutrition Discussed/Reviewed Nutrition Discussed  Pharmacy Interventions   Pharmacy Dicussed/Reviewed Pharmacy Topics Discussed  Safety Interventions   Safety Discussed/Reviewed Fall Risk        Follow up plan: Follow up call scheduled for 04/30/23 at 9:30 AM    Encounter Outcome:  Patient Visit Completed   Jessica [name].Jessica [name] MSW,  LCSW Licensed Visual merchandiser [facility] Care Management [phone]

## 2023-04-09 ENCOUNTER — Ambulatory Visit: Payer: Self-pay

## 2023-04-09 NOTE — Patient Outreach (Signed)
  Care Coordination   Follow Up Visit Note   04/09/2023 Name: Jessica [name] MRN: [id number] DOB: [birth year]  Jessica [name] is a 89 y.o. year old female who sees Plotnikov, Aleksei V, MD for primary care. I spoke with  Jessica [name] by phone today.  What matters to the patients health and wellness today? Patient continues to live alone. Jessica [name] expresses that her niece continues to assist her as needed. She states she has medications and is taking as prescribed. She states she is not sure if she is eligible for Medicaid and states that her niece is looking into it. She reports home health PT has discharged her and she is sometimes performing the exercises previously provided by the home health therapist. Per Jessica [name], she was instructed by PCP to walk 4 times/day. And she states she is walking short distances with assistive device as instructed. However, she states she does not overdo it. Jessica [name] states she has not fallen in over a month. Patient also expresses the losses she has had in her family.   Goals Addressed             This Visit's Progress    Care Coordination-assist with health management       Interventions Today    Flowsheet Row Most Recent Value  Chronic Disease   Chronic disease during today's visit Diabetes, Congestive Heart Failure (CHF), Hypertension (HTN), Chronic Kidney Disease/End Stage Renal Disease (ESRD)  General Interventions   General Interventions Discussed/Reviewed General Interventions Reviewed, Communication with  [Evaluation of current treatment plan for health condition and patient's adherence to plan. assess paitent's knowledge of community resources for in home care. discussed the difference between home health care and in-home aid assistance.]  Communication with Social Work  [communication regarding in home care resources, assist patient with insurance benefit. RNCM provided LCSW with insurance contact person/number]  Exercise  Interventions   Exercise Discussed/Reviewed Exercise Reviewed  [encouraged to perform exercised provided by previoue [facility] therapist and follow activity level recommended by PCP]  Education Interventions   Education Provided Provided Education  [discussed difference between in home care assistance and home health agency(skilled care).]  Provided Verbal Education On [employer], [employer], Other  [discussed meals on wheels, patient is aware of meals on wheels(reports neice Jessica [name] is also aware). encouraged to take medications as prescribed, attend provider visits as scheduled.]  Mental Health Interventions   Mental Health Discussed/Reviewed Coping Strategies, Mental Health Discussed  [active listening and support]  Nutrition Interventions   Nutrition Discussed/Reviewed Nutrition Reviewed  Pharmacy Interventions   Pharmacy Dicussed/Reviewed Pharmacy Topics Reviewed  [medications reviewed]  Safety Interventions   Safety Discussed/Reviewed Safety Reviewed, Home Safety  [encouraged to continue to perform exercises provided by home health PT.]  Home Safety Assistive Devices  [fall prevention strategies reviewed.]             SDOH assessments and interventions completed:  No  Care Coordination Interventions:  Yes, provided   Follow up plan: Follow up call scheduled for 05/10/23    Encounter Outcome:  Patient Visit Completed   [name], RN, MSN, BSN, CCM Care Management Coordinator [phone]

## 2023-04-09 NOTE — Patient Instructions (Signed)
 Visit Information  Thank you for taking time to visit with me today. Please don't hesitate to contact me if I can be of assistance to you.   Following are the goals we discussed today:  Continue to take medications as prescribed. Continue to attend provider visits as scheduled Continue to eat healthy, lean meats, vegetables, fruits, avoid saturated and transfats Continue to perform exercises as instructed by previous home health Physical therapist Contact provider with health questions or concerns as needed  Our next appointment is by telephone on 05/10/23 at 11:15 am  Please call the care guide team at [phone] if you need to cancel or reschedule your appointment.   If you are experiencing a Mental Health or Behavioral Health Crisis or need someone to talk to, please call the Suicide and Crisis Lifeline: 988 call the [place]  National Suicide Prevention Lifeline: 678-618-8701 or TTY: 647-125-6718 TTY 240-855-6647) to talk to a trained counselor   [name], RN, MSN, BSN, CCM Care Management Coordinator [phone]

## 2023-04-13 ENCOUNTER — Ambulatory Visit: Payer: Self-pay

## 2023-04-13 NOTE — Patient Outreach (Signed)
  Care Coordination   Multidisciplinary Case Review Note    04/13/2023 4:13 pm Late Entry for 04/11/2023  Name: Jessica [name] MRN: [id number] DOB: [birth year]  Jessica [name] is a 89 y.o. year old female who sees Plotnikov, Karlynn GAILS, MD for primary care.  The multidisciplinary care team met today to review patient care needs and barriers.    Goals Addressed             This Visit's Progress    COMPLETED: Care Coordination       Interventions Today    Flowsheet Row Most Recent Value  General Interventions   General Interventions Discussed/Reviewed General Interventions Reviewed  [Multidisciplinary case discussion completed]            SDOH assessments and interventions completed:  No  Care Coordination Interventions Activated:  Yes   Care Coordination Interventions:  Yes, provided   Follow up plan:  RNCM will follow up as previously scheduled. LCSW will follow up as previously scheduled and review with patient possible in home care assistance resources.    Multidisciplinary Team Attendees:   Glendia Pear, LCSW, [name], [facility]  Scribe for Multidisciplinary Case Review:  [name], RNCM

## 2023-04-16 ENCOUNTER — Ambulatory Visit: Payer: Self-pay | Admitting: Licensed Clinical Social Worker

## 2023-04-16 NOTE — Patient Outreach (Signed)
 Care Coordination   Follow Up Visit Note   04/16/2023 Name: Jessica [name] MRN: [id number] DOB: 1934-01-02  Jessica [name] is a 89 y.o. year old female who sees Plotnikov, Aleksei V, MD for primary care. I spoke with  Jessica [name] by phone today.  What matters to the patients health and wellness today?  Client spoke of in home care needs. She uses wheelchair to help with ambulation    Goals Addressed             This Visit's Progress    Client spoke of in home care needs. She uses wheelchair to help with ambulation       Interventions:  Spoke with client via phone today about client needs Jessica [name], niece and contact for client,  provides some support for client Client was receiving Mom's Meals but no longer gets Mom's Meals. LCSW talked with client about [facility] insurance and encouraged her to call [facility] and talk with insurance  representative about insurance benefits possibly for help with frozen meals for client Client said she would call her insurance provider and ask about any benefits available for frozen meals support  Jessica [name], client niece and contact,  resides in [place], [place] and is trying to offer support for client. Jessica does bring client meals from time to time and this is helpful to client.  Client and Jessica have been provided with information on community resources for client by LCSW Reggie Lark).   Client sees Dr. Garald as PCP. Client said she has appointment with PCP in February of 2025  Discussed medication procurement for client. She said she has her prescribed medications Discussed program support with RN, LCSW, Pharmacist. Discussed client support with RN [name]. LCSW informed Jolena today that LCSW had called Jessica [name] at [employer] to inquire about insurance coverage of client.  LCSW is awaiting return call from Jessica [name], at [employer] said that insurance premium is taken out of her account each  month for $ 84.25. Discussed pain issues of client . Client spoke of pain in her left knee Provided counseling support for client Used Active Listening techniques to allow client to share her feelings Discussed fall risk. Client is fearful of falling.  She can only walk short distances.  Client said she has not fallen in about 2 months.  Discussed client vision. She wears glasses. Client said she may need to schedule another eye exam appointment.    Discussed grief issues of client. Reminded client of support available through Hospice agency.  Spoke with client about grief support/bereavement support with [facility] Discussed client ambulation. Client uses wheelchair to help her ambulate Client said she is no longer getting physical therapy support in the home. She is no longer getting nursing support in the home.  Discussed transport needs of client. Encouraged  client or niece, Jessica, to call LCSW as needed for SW support  for client at [phone] Client appreciative of call from LCSW today          SDOH assessments and interventions completed:  Yes  SDOH Interventions Today    Flowsheet Row Most Recent Value  SDOH Interventions   Depression Interventions/Treatment  Counseling  Physical Activity Interventions Other (Comments)  [uses wheelchair to help her ambulate]  Stress Interventions Provide Counseling  [stress in managing daily needs. stress in managing mobiity]        Care Coordination Interventions:  Yes, provided     Follow up  plan: Client has LCSW name and phone number and will call LCSW as needed for SW support    Encounter Outcome:  Patient Visit Completed   Jessica RAMAN.Arleny Kruger MSW, LCSW Licensed Visual Merchandiser Jessica [name] Care Management [phone]

## 2023-04-16 NOTE — Patient Instructions (Signed)
 Visit Information  Thank you for taking time to visit with me today. Please don't hesitate to contact me if I can be of assistance to you.   Following are the goals we discussed today:   Goals Addressed             This Visit's Progress    Client spoke of in home care needs. She uses wheelchair to help with ambulation       Interventions:  Spoke with client via phone today about client needs [name] Party, niece and contact for client,  provides some support for client Client was receiving Mom's Meals but no longer gets Mom's Meals. LCSW talked with client about [facility] insurance and encouraged her to call [facility] and talk with insurance  representative about insurance benefits possibly for help with frozen meals for client Client said she would call her insurance provider and ask about any benefits available for frozen meals support  [name] Party, client niece and contact,  resides in [place], [place] and is trying to offer support for client. [name] does bring client meals from time to time and this is helpful to client.  Client and [name] have been provided with information on community resources for client by LCSW Reggie Lark).   Client sees Dr. Garald as PCP. Client said she has appointment with PCP in February of 2025  Discussed medication procurement for client. She said she has her prescribed medications Discussed program support with RN, LCSW, Pharmacist. Discussed client support with RN [name]. LCSW informed Milica today that LCSW had called [name] at [employer] to inquire about insurance coverage of client.  LCSW is awaiting return call from [name], at [employer] said that insurance premium is taken out of her account each month for $ 84.25. Discussed pain issues of client . Client spoke of pain in her left knee Provided counseling support for client Used Active Listening techniques to allow client to share her feelings Discussed fall  risk. Client is fearful of falling.  She can only walk short distances.  Client said she has not fallen in about 2 months.  Discussed client vision. She wears glasses. Client said she may need to schedule another eye exam appointment.    Discussed grief issues of client. Reminded client of support available through Hospice agency.  Spoke with client about grief support/bereavement support with [facility] Agency Discussed client ambulation. Client uses wheelchair to help her ambulate Client said she is no longer getting physical therapy support in the home. She is no longer getting nursing support in the home.  Discussed transport needs of client. Encouraged  client or niece, [name], to call LCSW as needed for SW support  for client at [phone] Client appreciative of call from LCSW today         Client has LCSW name and phone number and will call LCSW as needed for SW support  Please call the care guide team at [phone] if you need to cancel or reschedule your appointment.   If you are experiencing a Mental Health or Behavioral Health Crisis or need someone to talk to, please go to [facility] [address] [place] [phone])   The patient verbalized understanding of instructions, educational materials, and care plan provided today and DECLINED offer to receive copy of patient instructions, educational materials, and care plan.   The patient has been provided with contact information for the care management team and has been advised  to call with any health related questions or concerns.   Ozell RAMAN.Ridge Lafond MSW, LCSW Licensed Visual Merchandiser [facility] Care Management [phone]

## 2023-04-17 ENCOUNTER — Other Ambulatory Visit: Payer: Self-pay | Admitting: Internal Medicine

## 2023-04-17 ENCOUNTER — Ambulatory Visit: Payer: Medicare Other | Admitting: Licensed Clinical Social Worker

## 2023-04-17 NOTE — Patient Instructions (Signed)
 Visit Information  Thank you for taking time to visit with me today. Please don't hesitate to contact me if I can be of assistance to you.   Following are the goals we discussed today:   Goals Addressed             This Visit's Progress    Client spoke of in home care needs. She uses wheelchair to help with ambulation       Interventions:  Spoke with client via phone today about client needs and status Informed client that LCSW did speak with Jessica [name], insurance representative with [employer].  Informed client that Jessica stated that client has a Short Term Home Health Plan through insurer.  Client is paying a monthly premium for this Short Term Home Health Plan.   Client did say that Jessica had helped her before in completing some claim forms for insurance reimbursement.  LCSW did inform client that LCSW had shared this insurance information with RN Jessica [name] who is providing nursing support for client.  Client insurance is with company :  [employer]   Client said she needs in home aide support if possible. She said her niece helps her but that since her niece resides in [place], [place] , it is difficult for niece to come to client home frequently.  Niece often brings food for client when niece visits client Jessica Shutter RN and LCSW also spoke of role of PCP in seeking in home care for client through her  Short Term Home Health Plan Client has spoken of client use of wheelchair in the home environment.  Client does have her prescribed medications Encouraged client to call LCSW as needed at [phone] to discuss SW needs of client. Client was appreciative of call today from LCSW           Client has LCSW name and phone number and will call LCSW as needed to discuss SW needs of client  Please call the care guide team at [phone] if you need to cancel or reschedule your appointment.   If you are experiencing a Mental Health or Behavioral Health Crisis or  need someone to talk to, please go to [facility] [address] [place] [phone])   The patient verbalized understanding of instructions, educational materials, and care plan provided today and DECLINED offer to receive copy of patient instructions, educational materials, and care plan.   The patient has been provided with contact information for the care management team and has been advised to call with any health related questions or concerns.   Jessica [name].Jessica [name] MSW, LCSW Licensed Visual Merchandiser [facility] Care Management [phone]

## 2023-04-17 NOTE — Patient Outreach (Signed)
  Care Coordination   Follow Up Visit Note   04/17/2023 Name: Jessica [name] MRN: [id number] DOB: 1934/02/05  Jessica [name] is a 89 y.o. year old female who sees Plotnikov, Aleksei V, MD for primary care. I spoke with  Jessica [name] by phone today.  What matters to the patients health and wellness today?  Client spoke of in home care needs. She uses wheelchair to help with ambulation    Goals Addressed             This Visit's Progress    Client spoke of in home care needs. She uses wheelchair to help with ambulation       Interventions:  Spoke with client via phone today about client needs and status Informed client that LCSW did speak with Jessica [name], insurance representative with [employer].  Informed client that Jessica stated that client has a Short Term Home Health Plan through insurer.  Client is paying a monthly premium for this Short Term Home Health Plan.   Client did say that Jessica had helped her before in completing some claim forms for insurance reimbursement.  LCSW did inform client that LCSW had shared this insurance information with RN Jessica [name] who is providing nursing support for client.  Client insurance is with company :  [employer]   Client said she needs in home aide support if possible. She said her niece helps her but that since her niece resides in [place], [place] , it is difficult for niece to come to client home frequently.  Niece often brings food for client when niece visits client Jessica Shutter RN and LCSW also spoke of role of PCP in seeking in home care for client through her  Short Term Home Health Plan Client has spoken of client use of wheelchair in the home environment.  Client does have her prescribed medications Encouraged client to call LCSW as needed at [phone] to discuss SW needs of client. Client was appreciative of call today from LCSW           SDOH assessments and interventions completed:  Yes  SDOH  Interventions Today    Flowsheet Row Most Recent Value  SDOH Interventions   Depression Interventions/Treatment  Counseling  Physical Activity Interventions Other (Comments)  [client has challenges with mobility issues]  Stress Interventions Provide Counseling        Care Coordination Interventions:  Yes, provided   Interventions Today    Flowsheet Row Most Recent Value  Chronic Disease   Chronic disease during today's visit Other  [spoke with client about client needs]  General Interventions   General Interventions Discussed/Reviewed General Interventions Discussed, Community Resources  Education Interventions   Education Provided Provided Education  Provided Engineer, [employer]  Mental Health Interventions   Mental Health Discussed/Reviewed Coping Strategies  [client has in home care needs. she is trying to manage in home care needs. she has support of her niece who  resides in [place], [place]]  Nutrition Interventions   Nutrition Discussed/Reviewed Nutrition Discussed  Pharmacy Interventions   Pharmacy Dicussed/Reviewed Pharmacy Topics Discussed        Follow up plan: Client has LCSW name and phone number and will call LCSW as needed to discuss SW needs of client   Encounter Outcome:  Patient Visit Completed   Jessica [name].Jessica [name] MSW, LCSW Licensed Visual Merchandiser [facility] Care Management [phone]

## 2023-04-18 ENCOUNTER — Ambulatory Visit: Payer: Self-pay

## 2023-04-18 NOTE — Patient Outreach (Signed)
  Care Coordination   Multidisciplinary Case Review Note    04/18/2023 Name: Jessica [name] MRN: [id number] DOB: 08-Nov-1933  Jessica [name] is a 89 y.o. year old female who sees Plotnikov, Aleksei V, MD for primary care.  The multidisciplinary care team met today to review patient care needs and barriers.    Goals Addressed             This Visit's Progress    COMPLETED: Care Coordination       Interventions Today    Flowsheet Row Most Recent Value  General Interventions   General Interventions Discussed/Reviewed General Interventions Reviewed  [Multidisciplinary case discussion completed]            SDOH assessments and interventions completed:  No  Care Coordination Interventions Activated:  Yes   Care Coordination Interventions:  Yes, provided   Follow up plan:  LCSW and RNCM will continue to work with patient regarding care management needs.    Multidisciplinary Team Attendees:   Lindi Revering, RNCM; Alexandria Angel, LCSW  Scribe for Multidisciplinary Case Review:  Lindi Revering, [facility]  Lindi Revering, RN, MSN, BSN, CCM RN Case Manager [phone]

## 2023-04-19 ENCOUNTER — Telehealth: Payer: Self-pay | Admitting: Internal Medicine

## 2023-04-19 ENCOUNTER — Telehealth (HOSPITAL_BASED_OUTPATIENT_CLINIC_OR_DEPARTMENT_OTHER): Payer: Self-pay | Admitting: Cardiovascular Disease

## 2023-04-19 DIAGNOSIS — M150 Primary generalized (osteo)arthritis: Secondary | ICD-10-CM

## 2023-04-19 DIAGNOSIS — R29898 Other symptoms and signs involving the musculoskeletal system: Secondary | ICD-10-CM

## 2023-04-19 DIAGNOSIS — M545 Low back pain, unspecified: Secondary | ICD-10-CM

## 2023-04-19 DIAGNOSIS — R269 Unspecified abnormalities of gait and mobility: Secondary | ICD-10-CM

## 2023-04-19 NOTE — Telephone Encounter (Signed)
Please address.  Thank you

## 2023-04-19 NOTE — Telephone Encounter (Signed)
Reason for CRM: Patients niece called in and she is requesting a phone call from nurse regarding request for walker. Please contact niece hen order has been completed. Patient or niece can be reached at [phone].   Pt is not established at this office.

## 2023-04-19 NOTE — Telephone Encounter (Signed)
Patient is needing her walker her niece is calling in for her stating that the prescription is needing to be sent over to [facility]  fax number [phone] she is needing this asap

## 2023-04-19 NOTE — Telephone Encounter (Signed)
Low suspicion will be approved if she has not met the 3% out of pocket this year. Additionally need her to bring insurance card to her upcoming visit to be scanned. How much Jessica [name] does she have? If there is going to be a gap may need to place her on Valsartan in the interim.   Alver Sorrow, NP

## 2023-04-19 NOTE — Telephone Encounter (Signed)
Pt's niece and [facility] POA [name] stopped by the front desk at [facility] to drop off a patient assistance form for Caitlin/Dr Duke Salvia to fill out. [name] said that Rutherford Nail often calls her so if there are any questions Rutherford Nail might be able to help. Placed in Caitlin's box at the front desk.

## 2023-04-26 ENCOUNTER — Telehealth: Payer: Self-pay | Admitting: Internal Medicine

## 2023-04-26 ENCOUNTER — Ambulatory Visit: Payer: Medicare Other | Admitting: Internal Medicine

## 2023-04-26 NOTE — Telephone Encounter (Signed)
Patient had  a fall and her niece stated patient is complaining of ear pain now she would like a call back to see if patient can be seen today

## 2023-04-27 NOTE — Telephone Encounter (Signed)
Spoke with niece and will get forms for entresto and jardiace filled out and faxed  Has SH[employer]P denial letter

## 2023-04-30 ENCOUNTER — Encounter: Payer: Self-pay | Admitting: Licensed Clinical Social Worker

## 2023-04-30 NOTE — Telephone Encounter (Signed)
Within a walker does she need: A regular walker or rollator walker? Please print a DME prescription.  Diagnosis-gait disorder.  Thank you

## 2023-05-01 ENCOUNTER — Ambulatory Visit: Payer: Self-pay | Admitting: Internal Medicine

## 2023-05-01 ENCOUNTER — Telehealth: Payer: Self-pay | Admitting: Internal Medicine

## 2023-05-01 ENCOUNTER — Ambulatory Visit: Payer: Self-pay | Admitting: Licensed Clinical Social Worker

## 2023-05-01 MED ORDER — EMPAGLIFLOZIN 10 MG PO TABS: 10.0000 mg | ORAL_TABLET | Freq: Every day | ORAL | 0 refills | Status: AC

## 2023-05-01 NOTE — Telephone Encounter (Signed)
Applications waiting for Dr Duke Salvia to sign  Jardiance 10 mg samples #2 at front for patient pick up

## 2023-05-01 NOTE — Patient Instructions (Signed)
Visit Information  Thank you for taking time to visit with me today. Please don't hesitate to contact me if I can be of assistance to you.   Following are the goals we discussed today:   Goals Addressed             This Visit's Progress    Client spoke of in home care needs. She uses wheelchair to help with ambulation       Interventions:  Spoke with client via phone today about client needs and status Client said she had fallen 2 times in the past week.  She said she has a bruise on her right arm at the elbow. No serious injuries from falls recently.   LCSW asked client about Life Alert. Client said she does have Life Alert and has used  Heritage manager when needed.  She said use of Life Alert has been helpful to client Client spoke of her history of hospital care and spoke of history of SNF care.  She said when she discharged from SNF in the past she received Home Health PT and Home Health Aide support in the home for a while. She said this in home help was beneficial to client; but, then, the assistance ended.  She said her niece, [name], visited her last Thursday and helped her as able and brought her food items to eat.  She said [name] , her niece, is main support of client Client has wheelchair she uses to help with ambulation.  Her PCP is Dr. Macarthur Critchley Plotnikov.  Her PCP has been inquiring about a walker for client and what type of walker would be most helpful to client. LCSW collaborated today with RN [name] about client recent falls and care needs client faces As noted previously, client does have a Short Term Home Health Plan through insurer [employer] Client has spoken of client use of wheelchair in the home environment.  Client does have her prescribed medications Encouraged client to call LCSW as needed at [phone] to discuss SW needs of client. Client was appreciative of call today from LCSW          Client has LCSW name and phone number and will call LCSW  as needed for SW support at [phone]   Please call the care guide team at [phone] if you need to cancel or reschedule your appointment.   If you are experiencing a Mental Health or Behavioral Health Crisis or need someone to talk to, please go to [facility] [address] [place] [phone])   The patient verbalized understanding of instructions, educational materials, and care plan provided today and DECLINED offer to receive copy of patient instructions, educational materials, and care plan.   The patient has been provided with contact information for the care management team and has been advised to call with any health related questions or concerns.    [name]  MSW, LCSW /[facility] Licensed Clinical Social Worker Direct Dial:  [phone] Fax:  [phone] Website:  [email or web address]

## 2023-05-01 NOTE — Telephone Encounter (Unsigned)
Copied from CRM [id number]. Topic: Clinical - Red Word Triage >> May 01, 2023  5:03 PM Eunice Blase wrote: Red Word that prompted transfer to Nurse Triage: Pt's daughter called pt has fallen twice due to dizziness.

## 2023-05-01 NOTE — Addendum Note (Signed)
Addended by: Delsa Grana R on: 05/01/2023 01:01 PM   Modules accepted: Orders

## 2023-05-01 NOTE — Telephone Encounter (Signed)
What kind of a walker does she need: a regular walker or rollator walker? Please print a DME prescription.  Diagnosis-gait disorder.  Thank you

## 2023-05-01 NOTE — Patient Outreach (Signed)
Care Coordination   Follow Up Visit Note   05/01/2023 Name: Jessica [name] MRN: [id number] DOB: 1933-09-25  Jessica [name] is a 89 y.o. year old female who sees [name], Jessica Quint, MD for primary care. I spoke with  Jessica [name] by phone today.  What matters to the patients health and wellness today?  Client spoke of in home care needs. She uses wheelchair to help with ambulation    Goals Addressed             This Visit's Progress    Client spoke of in home care needs. She uses wheelchair to help with ambulation       Interventions:  Spoke with client via phone today about client needs and status Client said she had fallen 2 times in the past week.  She said she has a bruise on her right arm at the elbow. No serious injuries from falls recently.   LCSW asked client about Life Alert. Client said she does have Life Alert and has used  Heritage manager when needed.  She said use of Life Alert has been helpful to client Client spoke of her history of hospital care and spoke of history of SNF care.  She said when she discharged from SNF in the past she received Home Health PT and Home Health Aide support in the home for a while. She said this in home help was beneficial to client; but, then, the assistance ended.  She said her niece, Jessica [name], visited her last Thursday and helped her as able and brought her food items to eat.  She said Jessica [name] , her niece, is main support of client Client has wheelchair she uses to help with ambulation.  Her PCP is Dr. Macarthur Critchley [name].  Her PCP has been inquiring about a walker for client and what type of walker would be most helpful to client. LCSW collaborated today with RN Jessica [name] about client recent falls and care needs client faces As noted previously, client does have a Short Term Home Health Plan through insurer [employer] Client has spoken of client use of wheelchair in the home environment.  Client does have her prescribed  medications Encouraged client to call LCSW as needed at [phone] to discuss SW needs of client. Client was appreciative of call today from LCSW          SDOH assessments and interventions completed:  Yes  SDOH Interventions Today    Flowsheet Row Most Recent Value  SDOH Interventions   Depression Interventions/Treatment  Counseling  Physical Activity Interventions Other (Comments)  [fall risk. Has had 2 falls in past week. No serious injury]  Stress Interventions Provide Counseling  [client has stress in managing daily needs and ADLs. She has stress in managing mobilitiy issues. She has a wheelchair to use as needed]        Care Coordination Interventions:  Yes, provided    Interventions Today    Flowsheet Row Most Recent Value  Chronic Disease   Chronic disease during today's visit Other  [spoke with client about client needs]  General Interventions   General Interventions Discussed/Reviewed General Interventions Discussed, Community Resources  Education Interventions   Education Provided Provided Education  Provided Verbal Education On [employer]  Mental Health Interventions   Mental Health Discussed/Reviewed Coping Strategies  [client has depression issues,  client has anxiety issues. she is trying to cope with her in home care needs. . She has some support from her niece  who resides in [place], [place]]  Nutrition Interventions   Nutrition Discussed/Reviewed Nutrition Discussed  Pharmacy Interventions   Pharmacy Dicussed/Reviewed Pharmacy Topics Discussed  Safety Interventions   Safety Discussed/Reviewed Fall Risk       Follow up plan: Client has LCSW name and phone number and will call LCSW as needed for SW support   Encounter Outcome:  Patient Visit Completed    Jessica [name]  MSW, LCSW [place]/[facility] Licensed Clinical Social Worker Direct Dial:  [phone] Fax:  [phone] Website:  [email or web address]

## 2023-05-01 NOTE — Telephone Encounter (Signed)
Chief Complaint: Dizziness, two falls Symptoms: Dizziness Frequency: Daughter states she believes it to be 10 days Pertinent Negatives: Patient denies hitting head, weakness of arms or legs, losing consciousness Disposition: [] ED /[] Urgent Care (no appt availability in office) / [x] Appointment(In office/virtual)/ []  [facility]/ [] Home Care/ [] Refused Recommended Disposition /[] [facility]/ []  Follow-up with PCP Additional Notes: Patient's daughter called in to report her mother has been dizzy recently and has had two falls. Patient is not with her daughter, so unable to fully assess and ask questions. Patient's daughter states mother's BP is 136/72 currently but unsure of what her blood sugar level is, and is sure she is a diabetic. Patient's daughter states she is just hearing of the dizziness and patient stating "I dont feel right" today, but believes this has been going on for roughly 10 days. Patient fell Wednesday, which patient's daughter believes to be loss of balance and fell into wheelchair. Patient's daughter denies any injuries from fall aside from one bruise on her arm, and states patient did not hit her head with any of her falls. Patient's daughter also states patient was utilizing Home Health for help and evaluation at home, but patient has to see her PCP to get another order for it to continue. Patient's daughter believes it needs to be continued. Rescheduled patient's appointment to a sooner date with patient's PCP. Advised patient's daughter to relay message to her mother that if she feels faint, weakness in arms or legs, worsening symptoms, low blood sugar, to be evaluated at the Emergency Dept. Patient's daughter verbalized understanding.    Copied from CRM [id number]. Topic: Clinical - Red Word Triage >> May 01, 2023  5:03 PM Eunice Blase wrote: Red Word that prompted transfer to Nurse Triage: Pt's daughter called pt has fallen twice due to dizziness. Reason for  Disposition  [1] MILD dizziness (e.g., walking normally) AND [2] has been evaluated by doctor (or NP/PA) for this  Answer Assessment - Initial Assessment Questions 1. DESCRIPTION: "Describe your dizziness."     Patient states she feels dizzy and doesn't feel right 5. ONSET:  "When did the dizziness begin?"     Roughly 10 days ago  Protocols used: Dizziness - Lightheadedness-A-AH

## 2023-05-01 NOTE — Telephone Encounter (Signed)
Spoke with pts niece and she has stated pt is needing a Rolator walker with seat and breaks.  Pt called her niece and stated pt has fallen twice. Pt has refused to be taken to be seen by a provider.

## 2023-05-02 NOTE — Telephone Encounter (Signed)
Waiting for Dr Duke Salvia to sign, will faxed once she has

## 2023-05-04 ENCOUNTER — Telehealth: Payer: Self-pay | Admitting: Internal Medicine

## 2023-05-04 NOTE — Telephone Encounter (Signed)
Routing to provider office for review.  Copied from CRM [id number]. Topic: Clinical - Prescription Issue >> May 04, 2023  1:33 PM Florestine Avers wrote: Reason for CRM: Casimiro Needle from [facility] is calling in because he received a prescription order for the patients rolling walker. Casimiro Needle states that only a prescription was sent, but they also need "face to face" notes, whether it be a condition or a medical reason for why the patient needs the medical equipment. Providers notes can be faxed over to [phone], they can be reached by phone at [phone].

## 2023-05-04 NOTE — Telephone Encounter (Signed)
Re-sent fax with notes.

## 2023-05-07 ENCOUNTER — Encounter: Payer: Self-pay | Admitting: Internal Medicine

## 2023-05-07 ENCOUNTER — Ambulatory Visit (INDEPENDENT_AMBULATORY_CARE_PROVIDER_SITE_OTHER): Payer: Medicare Other | Admitting: Internal Medicine

## 2023-05-07 ENCOUNTER — Telehealth: Payer: Self-pay | Admitting: Internal Medicine

## 2023-05-07 VITALS — BP 120/70 | HR 61 | Temp 98.3°F | Ht 61.0 in | Wt 238.0 lb

## 2023-05-07 DIAGNOSIS — I480 Paroxysmal atrial fibrillation: Secondary | ICD-10-CM

## 2023-05-07 DIAGNOSIS — R296 Repeated falls: Secondary | ICD-10-CM | POA: Diagnosis not present

## 2023-05-07 DIAGNOSIS — N183 Chronic kidney disease, stage 3 unspecified: Secondary | ICD-10-CM | POA: Diagnosis not present

## 2023-05-07 DIAGNOSIS — W19XXXA Unspecified fall, initial encounter: Secondary | ICD-10-CM

## 2023-05-07 DIAGNOSIS — R42 Dizziness and giddiness: Secondary | ICD-10-CM | POA: Diagnosis not present

## 2023-05-07 DIAGNOSIS — Z7984 Long term (current) use of oral hypoglycemic drugs: Secondary | ICD-10-CM

## 2023-05-07 DIAGNOSIS — J441 Chronic obstructive pulmonary disease with (acute) exacerbation: Secondary | ICD-10-CM

## 2023-05-07 DIAGNOSIS — E118 Type 2 diabetes mellitus with unspecified complications: Secondary | ICD-10-CM

## 2023-05-07 DIAGNOSIS — R269 Unspecified abnormalities of gait and mobility: Secondary | ICD-10-CM

## 2023-05-07 LAB — COMPREHENSIVE METABOLIC PANEL
ALT: 25 U/L (ref 0–35)
AST: 30 U/L (ref 0–37)
Albumin: 4.2 g/dL (ref 3.5–5.2)
Alkaline Phosphatase: 78 U/L (ref 39–117)
BUN: 21 mg/dL (ref 6–23)
CO2: 26 meq/L (ref 19–32)
Calcium: 9.6 mg/dL (ref 8.4–10.5)
Chloride: 98 meq/L (ref 96–112)
Creatinine, Ser: 1.18 mg/dL (ref 0.40–1.20)
GFR: 41.49 mL/min — ABNORMAL LOW (ref >60.00)
Glucose, Bld: 117 mg/dL — ABNORMAL HIGH (ref 70–99)
Potassium: 4.5 meq/L (ref 3.5–5.1)
Sodium: 134 meq/L — ABNORMAL LOW (ref 135–145)
Total Bilirubin: 0.5 mg/dL (ref 0.2–1.2)
Total Protein: 7.9 g/dL (ref 6.0–8.3)

## 2023-05-07 LAB — TSH: TSH: 2.92 u[IU]/mL (ref 0.35–5.50)

## 2023-05-07 LAB — HEMOGLOBIN A1C: Hgb A1c MFr Bld: 9.2 % — ABNORMAL HIGH (ref 4.6–6.5)

## 2023-05-07 NOTE — Telephone Encounter (Signed)
Pt called requesting Jardiance samples, was not aware that they were already placed up front. She will pick up Friday if front staff can be made aware

## 2023-05-07 NOTE — Telephone Encounter (Signed)
Copied from CRM [id number]. Topic: General - Other >> May 07, 2023 12:24 PM Melissa C wrote: Reason for CRM: Reason for CRM: patient is in need of rollator instead of cane and company needs doctor's notes verifying face to face office visits where it is expressed that a rollator would benefit patient more than a cane as she has been falling. They will be there today for an office visit so they stated it would be great if this could be great if this could be done before their visit today because this is the last thing needed before they can get the rollator.  Fax number is [phone] attention: [facility]

## 2023-05-07 NOTE — Assessment & Plan Note (Signed)
 On Eliquis

## 2023-05-07 NOTE — Assessment & Plan Note (Signed)
Ataxic, weak legs Unsteady gait - needs a Rollator walker to prevent falls Start PT/OT

## 2023-05-07 NOTE — Telephone Encounter (Signed)
Received notification from [employer], [place] approved through 04/02/2024  Advised [name]

## 2023-05-07 NOTE — Assessment & Plan Note (Signed)
 She can continue with repaglinide and Jardiance.  We can add  Actos - she is to let me know if she is willing to start using insulin. Check A1c

## 2023-05-07 NOTE — Telephone Encounter (Signed)
Pt left appointment and then called back asking to speak to [facility] about her Rolator. Let them know Byrd Hesselbach was gone for the day Please call pt in the morning. [phone]

## 2023-05-07 NOTE — Progress Notes (Addendum)
Subjective:  Patient ID: Jessica [name], female    DOB: July 07, 1933  Age: 89 y.o. MRN: [id number]  CC: Fall (Pt has had 2 falls in the past week./Pt is needing F2F notes from today's visit in order for her to have [employer] approve her rollator walker.//Pt is needing a note today stating she is needing her mailbox on her door due to health risk and high risk of falling.)   HPI ROSHUNDA KEIR presents for gait disorder, falls, CRF Here w/Sharon H/o falls - as above   Outpatient Medications Prior to Visit  Medication Sig Dispense Refill   albuterol (VENTOLIN HFA) 108 (90 Base) MCG/ACT inhaler Inhale 2 puffs into the lungs every 6 (six) hours as needed for wheezing or shortness of breath. 8 g 2   allopurinol (ZYLOPRIM) 100 MG tablet TAKE 1 TABLET BY MOUTH DAILY 100 tablet 2   aspirin EC 81 MG tablet Take 162 mg by mouth daily. Swallow whole.     b complex vitamins tablet Take 1 tablet by mouth daily.     cefadroxil (DURICEF) 500 MG capsule Take 1 capsule (500 mg total) by mouth 2 (two) times daily. 60 capsule 11   Cholecalciferol 1000 UNITS tablet Take 1,000 Units by mouth daily.     CINNAMON PO Take 1 capsule by mouth daily.     empagliflozin (JARDIANCE) 10 MG TABS tablet Take 1 tablet (10 mg total) by mouth daily. 90 tablet 3   empagliflozin (JARDIANCE) 10 MG TABS tablet Take 1 tablet (10 mg total) by mouth daily before breakfast for 14 days. 14 tablet 0   ezetimibe (ZETIA) 10 MG tablet TAKE 1 TABLET BY MOUTH DAILY 90 tablet 1   furosemide (LASIX) 40 MG tablet TAKE 1 TABLET BY MOUTH TWICE  DAILY 200 tablet 2   levothyroxine (SYNTHROID) 75 MCG tablet TAKE 1 TABLET BY MOUTH DAILY 100 tablet 2   LORazepam (ATIVAN) 0.5 MG tablet Take 1 tablet (0.5 mg total) by mouth 2 (two) times daily as needed. for anxiety 180 tablet 1   metFORMIN (GLUCOPHAGE) 500 MG tablet TAKE 1 TABLET BY MOUTH TWICE  DAILY WITH MEALS 200 tablet 2   metoprolol succinate (TOPROL-XL) 50 MG 24 hr tablet TAKE 1 TABLET BY  MOUTH DAILY  WITH OR IMMEDIATELY FOLLOWING A  MEAL 100 tablet 2   Multiple Vitamin (MULTIVITAMIN) tablet Take 1 tablet by mouth daily.     nystatin powder Apply 1 Application topically daily. Apply to under breast topically every day shift for redness/moisture.     potassium chloride SA (KLOR-CON M) 20 MEQ tablet TAKE 1 TABLET BY MOUTH DAILY 100 tablet 2   repaglinide (PRANDIN) 2 MG tablet TAKE 2 TABLETS BY MOUTH 3 TIMES  DAILY BEFORE MEALS 600 tablet 2   rosuvastatin (CRESTOR) 10 MG tablet TAKE 1 TABLET BY MOUTH UP TO 3  TIMES WEEKLY AS TOLERATED 43 tablet 2   sacubitril-valsartan (ENTRESTO) 97-103 MG Take 1 tablet by mouth 2 (two) times daily. 180 tablet 3   spironolactone (ALDACTONE) 25 MG tablet Take 0.5 tablets (12.5 mg total) by mouth daily. 100 tablet 3   temazepam (RESTORIL) 15 MG capsule TAKE 1 CAPSULE BY MOUTH EVERY NIGHT AT BEDTIME AS NEEDED 90 capsule 1   venlafaxine XR (EFFEXOR-XR) 75 MG 24 hr capsule TAKE 1 CAPSULE BY MOUTH DAILY  WITH BREAKFAST 100 capsule 2   No facility-administered medications prior to visit.    ROS: Review of Systems  Constitutional:  Positive for fatigue. Negative for  activity change, appetite change, chills and unexpected weight change.  HENT:  Negative for congestion, mouth sores and sinus pressure.   Eyes:  Negative for visual disturbance.  Respiratory:  Negative for cough and chest tightness.   Gastrointestinal:  Negative for abdominal pain and nausea.  Genitourinary:  Positive for frequency. Negative for difficulty urinating and vaginal pain.  Musculoskeletal:  Positive for arthralgias, back pain and gait problem.  Skin:  Negative for pallor and rash.  Neurological:  Positive for weakness. Negative for dizziness, tremors, numbness and headaches.  Hematological:  Bruises/bleeds easily.  Psychiatric/Behavioral:  Positive for dysphoric mood. Negative for confusion, sleep disturbance and suicidal ideas. The patient is nervous/anxious.     Objective:   BP 120/70 (BP Location: Right Arm, Patient Position: Sitting, Cuff Size: Normal)   Pulse 61   Temp 98.3 F (36.8 C) (Oral)   Ht 5\' 1"  (1.549 m)   Wt 238 lb (108 kg)   SpO2 97%   BMI 44.97 kg/m   BP Readings from Last 3 Encounters:  05/07/23 120/70  02/28/23 (!) 100/56  02/26/23 112/80    Wt Readings from Last 3 Encounters:  05/07/23 238 lb (108 kg)  02/28/23 232 lb (105.2 kg)  02/26/23 232 lb (105.2 kg)    Physical Exam Constitutional:      General: She is not in acute distress.    Appearance: She is well-developed. She is obese.  HENT:     Head: Normocephalic.     Right Ear: External ear normal.     Left Ear: External ear normal.     Nose: Nose normal.  Eyes:     General:        Right eye: No discharge.        Left eye: No discharge.     Conjunctiva/sclera: Conjunctivae normal.     Pupils: Pupils are equal, round, and reactive to light.  Neck:     Thyroid: No thyromegaly.     Vascular: No JVD.     Trachea: No tracheal deviation.  Cardiovascular:     Rate and Rhythm: Normal rate and regular rhythm.     Heart sounds: Normal heart sounds.  Pulmonary:     Effort: No respiratory distress.     Breath sounds: No stridor. No wheezing.  Abdominal:     General: Bowel sounds are normal. There is no distension.     Palpations: Abdomen is soft. There is no mass.     Tenderness: There is no abdominal tenderness. There is no guarding or rebound.  Musculoskeletal:        General: No tenderness.     Cervical back: Normal range of motion and neck supple. No rigidity.  Lymphadenopathy:     Cervical: No cervical adenopathy.  Skin:    Findings: No erythema or rash.  Neurological:     Cranial Nerves: No cranial nerve deficit.     Motor: No abnormal muscle tone.     Coordination: Coordination normal.     Deep Tendon Reflexes: Reflexes normal.  Psychiatric:        Behavior: Behavior normal.        Thought Content: Thought content normal.        Judgment: Judgment normal.    Ataxic, weak legs Unsteady gait - needs a Rollator walker to prevent falls In a w/c     A total time of 45 minutes was spent preparing to see the patient, reviewing tests, x-rays, operative reports and other medical records.  Also,  obtaining history and performing comprehensive physical exam.  Additionally, counseling the patient regarding the above listed issues - falls walker/home PT/OT orders etc.   Finally, documenting clinical information in the health records, coordination of care, educating the patient. It is a complex case.  Lab Results  Component Value Date   WBC 10.4 02/26/2023   HGB 13.8 02/26/2023   HCT 41.7 02/26/2023   PLT 184.0 02/26/2023   GLUCOSE 210 (H) 02/26/2023   CHOL 207 (H) 07/12/2020   TRIG 277 (H) 07/12/2020   HDL 39 (L) 07/12/2020   LDLDIRECT 155.9 09/30/2012   LDLCALC 119 (H) 07/12/2020   ALT 25 02/26/2023   AST 25 02/26/2023   NA 136 02/26/2023   K 4.6 02/26/2023   CL 100 02/26/2023   CREATININE 1.40 (H) 02/26/2023   BUN 23 02/26/2023   CO2 24 02/26/2023   TSH 3.38 02/26/2023   INR 3.9 (H) 04/25/2022   HGBA1C 8.1 (H) 08/14/2022   MICROALBUR 4.5 (H) 12/02/2009    DG Hip Unilat With Pelvis 2-3 Views Left Result Date: 05/14/2022 CLINICAL DATA:  Cough, weakness, hip pain EXAM: DG HIP (WITH OR WITHOUT PELVIS) 2-3V LEFT COMPARISON:  None Available. FINDINGS: No acute fracture or dislocation. No aggressive osseous lesion. Normal alignment. Mild osteoarthritis of the left hip. Generalized osteopenia. Soft tissue are unremarkable. No radiopaque foreign body or soft tissue emphysema. IMPRESSION: 1. Mild osteoarthritis of the left hip. Given the patient's age and osteopenia, if there is persistent clinical concern for an occult hip fracture, a MRI of the hip is recommended for increased sensitivity. Electronically Signed   By: Elige Ko M.D.   On: 05/14/2022 09:35   DG Chest Port 1 View Result Date: 05/14/2022 CLINICAL DATA:  Cough, pain EXAM: PORTABLE  CHEST 1 VIEW COMPARISON:  04/25/2022 FINDINGS: Bilateral interstitial thickening. Small bilateral pleural effusions. Bibasilar atelectasis. No pneumothorax. Stable cardiomegaly. Prior TAVR. Dual lead cardiac pacemaker. No acute osseous abnormality. IMPRESSION: 1. Findings concerning for CHF. Electronically Signed   By: Elige Ko M.D.   On: 05/14/2022 09:34    Assessment & Plan:   Problem List Items Addressed This Visit     DM type 2, controlled, with complication (HCC)    She can continue with repaglinide and Jardiance.  We can add  Actos - she is to let me know if she is willing to start using insulin. Check A1c      Relevant Orders   Comprehensive metabolic panel   Hemoglobin A1c   RESOLVED: Paroxysmal atrial fibrillation (HCC)   On Eliquis      Relevant Orders   TSH   Vertigo   Ataxic, weak legs Unsteady gait - needs a Rollator walker to prevent falls Start PT/OT      Relevant Orders   Ambulatory referral to Home Health   Fall - Primary   Ataxic, weak legs Unsteady gait - needs a Rollator walker to prevent falls Start PT/OT      Relevant Orders   Ambulatory referral to Home Health   CRF (chronic renal failure), stage 3 (moderate) (HCC)   Hydrate well  Monitor GFR      Relevant Orders   Comprehensive metabolic panel   Hemoglobin A1c      No orders of the defined types were placed in this encounter.     Follow-up: Return in about 3 months (around 08/04/2023) for a follow-up visit.  Sonda Primes, MD

## 2023-05-07 NOTE — Assessment & Plan Note (Signed)
Hydrate well ?Monitor GFR ?

## 2023-05-08 NOTE — Telephone Encounter (Signed)
 Copied from CRM [id number]. Topic: General - Other >> May 08, 2023  4:17 PM Burnard DEL wrote: Reason for CRM: patients niece sharon is requesting a phone call from Hadassah splinter said that she has another question regarding the walker that her aunt is suppose to have ordered. Phone#: [phone]

## 2023-05-08 NOTE — Telephone Encounter (Signed)
 Spoke with the pts niece she has stated  Ozell from [employer] has stated a letter with the following needs to be sent in from PCP office due to F2F notes was not enough information to process approval for Rolator. Letter must say in detail the following  Jessica [name] condition prevents her from accomplishing her MRADL's entirely. [name] can safely use the walker in which will solve the patients mobility deficit.

## 2023-05-08 NOTE — Telephone Encounter (Signed)
Reason for CRM: [name] request a call back from [facility] regarding patient rollator, they were seen this morning and says she needs to relay to [facility] word for word what [employer] has told her so that there is no confusion.

## 2023-05-09 NOTE — Telephone Encounter (Signed)
 Copied from CRM [id number]. Topic: Clinical - Lab/Test Results >> May 09, 2023  2:39 PM Chantha C wrote: Reason for CRM: Reason for CRM: Patient's Jessica [name] niece [phone] is checking on forms Rollator walker order. Patient fell last night, patient was not hurt and the fire department had to get patient up. Jessica [name] also wants to discuss about patient's test results. Jessica [name] is insisted to speak with Earnie. Please advise and call back.

## 2023-05-09 NOTE — Telephone Encounter (Signed)
 Okay.  Added to note.  Thank you

## 2023-05-09 NOTE — Telephone Encounter (Signed)
 Copied from CRM [id number]. Topic: General - Other >> May 09, 2023 10:04 AM Albertha Alosa wrote: Reason for CRM: Patient niece called in regarding wanting to speak with Shelagh Derrick about [employer] and patient Rolator, would like a callback at [phone]

## 2023-05-10 ENCOUNTER — Ambulatory Visit: Payer: Self-pay

## 2023-05-10 NOTE — Patient Instructions (Signed)
 Visit Information  Thank you for taking time to visit with me today. Please don't hesitate to contact me if I can be of assistance to you.   Following are the goals we discussed today:  Continue to take medications as prescribed. Continue to attend provider visits as scheduled Continue to eat healthy, lean meats, vegetables, fruits, avoid saturated and transfats Contact provider with health questions or concerns as needed Continue fall safety precautions discussed.  Our next appointment is by telephone on 05/16/23 at 2:30 pm  Please call the care guide team at [phone] if you need to cancel or reschedule your appointment.   If you are experiencing a Mental Health or Behavioral Health Crisis or need someone to talk to, please call the Suicide and Crisis Lifeline: 988 call the [place]  National Suicide Prevention Lifeline: 661-322-3233 or TTY: 936-133-7955 TTY 4802608835) to talk to a trained counselor   [name], RN, MSN, BSN, CCM [facility], Population Health Case Manager Phone: [phone]    Fall Prevention in the Home Falls can cause injuries and affect people of all ages. There are many simple things that you can do to make your home safe and to help prevent falls. If you need it, ask for help making these changes. What actions can I take to prevent falls? General information Use good lighting in all rooms. Make sure to: Replace any light bulbs that burn out. Turn on lights if it is dark and use night-lights. Keep items that you use often in easy-to-reach places. Lower the shelves around your home if needed. Move furniture so that there are clear paths around it. Do not keep throw rugs or other things on the floor that can make you trip. If any of your floors are uneven, fix them. Add color or contrast paint or tape to clearly mark and help you see: Grab bars or handrails. First and last steps of staircases. Where the edge of each  step is. If you use a ladder or stepladder: Make sure that it is fully opened. Do not climb a closed ladder. Make sure the sides of the ladder are locked in place. Have someone hold the ladder while you use it. Know where your pets are as you move through your home. What can I do in the bathroom?     Keep the floor dry. Clean up any water that is on the floor right away. Remove soap buildup in the bathtub or shower. Buildup makes bathtubs and showers slippery. Use non-skid mats or decals on the floor of the bathtub or shower. Attach bath mats securely with double-sided, non-slip rug tape. If you need to sit down while you are in the shower, use a non-slip stool. Install grab bars by the toilet and in the bathtub and shower. Do not use towel bars as grab bars. What can I do in the bedroom? Make sure that you have a light by your bed that is easy to reach. Do not use any sheets or blankets on your bed that hang to the floor. Have a firm bench or chair with side arms that you can use for support when you get dressed. What can I do in the kitchen? Clean up any spills right away. If you need to reach something above you, use a sturdy step stool that has a grab bar. Keep electrical cables out of the way. Do not use floor polish or wax that makes floors slippery. What can I do with my stairs?  Do not leave anything on the stairs. Make sure that you have a light switch at the top and the bottom of the stairs. Have them installed if you do not have them. Make sure that there are handrails on both sides of the stairs. Fix handrails that are broken or loose. Make sure that handrails are as long as the staircases. Install non-slip stair treads on all stairs in your home if they do not have carpet. Avoid having throw rugs at the top or bottom of stairs, or secure the rugs with carpet tape to prevent them from moving. Choose a carpet design that does not hide the edge of steps on the stairs. Make  sure that carpet is firmly attached to the stairs. Fix any carpet that is loose or worn. What can I do on the outside of my home? Use bright outdoor lighting. Repair the edges of walkways and driveways and fix any cracks. Clear paths of anything that can make you trip, such as tools or rocks. Add color or contrast paint or tape to clearly mark and help you see high doorway thresholds. Trim any bushes or trees on the main path into your home. Check that handrails are securely fastened and in good repair. Both sides of all steps should have handrails. Install guardrails along the edges of any raised decks or porches. Have leaves, snow, and ice cleared regularly. Use sand, salt, or ice melt on walkways during winter months if you live where there is ice and snow. In the garage, clean up any spills right away, including grease or oil spills. What other actions can I take? Review your medicines with your health care provider. Some medicines can make you confused or feel dizzy. This can increase your chance of falling. Wear closed-toe shoes that fit well and support your feet. Wear shoes that have rubber soles and low heels. Use a cane, walker, scooter, or crutches that help you move around if needed. Talk with your provider about other ways that you can decrease your risk of falls. This may include seeing a physical therapist to learn to do exercises to improve movement and strength. Where to find more information Centers for Disease Control and Prevention, STEADI: tonerpromos.no [employer] on Aging: baseringtones.pl National Institute on Aging: baseringtones.pl Contact a health care provider if: You are afraid of falling at home. You feel weak, drowsy, or dizzy at home. You fall at home. Get help right away if you: Lose consciousness or have trouble moving after a fall. Have a fall that causes a head injury. These symptoms may be an emergency. Get help right away. Call 911. Do not wait to see if the  symptoms will go away. Do not drive yourself to the hospital. This information is not intended to replace advice given to you by your health care provider. Make sure you discuss any questions you have with your health care provider. Document Revised: 11/21/2021 Document Reviewed: 11/21/2021 Elsevier Patient Education  2024 [employer].

## 2023-05-10 NOTE — Patient Outreach (Signed)
  Care Coordination   Follow Up Visit Note   05/10/2023 Name: DYAMOND TOLOSA MRN: [id number] DOB: [birth year]  CORISA MONTINI is a 89 y.o. year old female who sees Plotnikov, Aleksei V, MD for primary care. I spoke with  [name] by phone today.  What matters to the patients health and wellness today?  Ms. Herbig reports she has had several falls over the past weeks and follow up appointment with PCP completed on 05/07/23. Per review of chart, referral for home health PT/OT placed per PCP.  Patient requesting bath aid also.    Goals Addressed             This Visit's Progress    Care Coordination-assist with health management       Interventions Today    Flowsheet Row Most Recent Value  Chronic Disease   Chronic disease during today's visit Diabetes, Congestive Heart Failure (CHF), Hypertension (HTN), Atrial Fibrillation (AFib), Chronic Obstructive Pulmonary Disease (COPD)  General Interventions   General Interventions Discussed/Reviewed General Interventions Reviewed, Doctor Visits, Communication with  [Evaluation of current treatment plan for health condition and patient's adherence to plan.]  Doctor Visits Discussed/Reviewed Doctor Visits Reviewed, PCP, Specialist  PCP/Specialist Visits Compliance with follow-up visit  [discussed upcoming follow up appointments]  Communication with PCP/Specialists  [message to PCP regarding request to add bath aid]  Exercise Interventions   Exercise Discussed/Reviewed Exercise Discussed  [discussed importance of home health therapy and participating with therapy sessions]  Education Interventions   Education Provided Provided Education  Provided Verbal Education On When to see the doctor, Medication, Blood Sugar Monitoring  Nutrition Interventions   Nutrition Discussed/Reviewed Nutrition Reviewed  Pharmacy Interventions   Pharmacy Dicussed/Reviewed Pharmacy Topics Reviewed  Safety Interventions   Safety Discussed/Reviewed Safety  Reviewed, Fall Risk, Home Safety  [confirmed patient still has life alert active.]  Home Safety Assistive Devices  [per chart review-referral  made on 05/07/23. discussed fall/safety precautions with patient. encouraged to use walker at all times.]             SDOH assessments and interventions completed:  No  Care Coordination Interventions:  Yes, provided   Follow up plan: Follow up call scheduled for 05/16/23    Encounter Outcome:  Patient Visit Completed   [name], RN, MSN, BSN, CCM [facility], Population Health Case Manager Phone: [phone]

## 2023-05-11 NOTE — Telephone Encounter (Signed)
 Pt niece called and stated Jessica [name] from [employer] stated Jessica [name] order needs to be written on a rx pad and faxed in with the following written on the rx Jessica [name] condition prevents her from accomplishing her MRADL's entirely. Jessica [name] can safely use the walker in which will solve the patients mobility deficit.

## 2023-05-13 NOTE — Telephone Encounter (Signed)
 Please type DME with the phrase below included.  Thank you

## 2023-05-14 ENCOUNTER — Ambulatory Visit: Payer: Self-pay | Admitting: Internal Medicine

## 2023-05-14 NOTE — Telephone Encounter (Signed)
 Chief Complaint: knee pain Symptoms: L knee pain, progressive weakness Frequency: fall last Thursday Pertinent Negatives: Patient denies fever, CP, SOB, dizziness, blurry vision, headache, nausea, vomiting, LOC, head strike Disposition: [] ED /[x] Urgent Care (no appt availability in office) / [] Appointment(In office/virtual)/ []  [facility]/ [] Home Care/ [x] Refused Recommended Disposition /[] [facility]/ []  Follow-up with PCP Additional Notes: Daughter Jessica [name] called on behalf of patient. Per daughter, pt fell last Thursday while transferring to her wheelchair. Pt has been reporting left knee pain since. Daughter states pt did not hit her head. Daughter also states pt has been complaining of worsening bilateral leg weakness. Pt was seen 2/3 in the office for frequent falls and referred to PT/OT. Daughter states they are waiting on a referral and are frustrated. Pt has not seen a HCP or had imaging of that knee since the fall. Daughter states pt told the daughter she did not want to go to the doctor and wanted to wait for PT to come to the house. RN advised daughter that per protocol and the fact that pt has not had imaging of the knee, she should see a HCP to ensure there are no fractures etc. At that point, daughter said it would be a good idea for RN to call the pt directly.  RN called pt. Pt denies hitting her head when she fell. Does take Eliquis  for Afib. Denies LOC. Story is the same as daughter's, fell transferring to wheelchair. Denies injuries or broken skin other than L knee pain. States she is applying voltaren  gel nightly. Denies dizziness, headache, N/[name], blurry vision. Endorses worsening weakness in the legs. States she feels like her legs are going to "give out" sometimes. States she can still bear weight on L leg. Given that pt is having moderate weakness and has had no imaging of knee, RN advised pt to be seen within 4 hours and recommended an UC. Pt declined, then said she  has no transportation. Daughter Jessica [name] is unable to drive right now due her medical condition. Pt states she may be able to secure transport for sometime later in the week. RN feels like patient needs to be seen earlier.  Pt frustrated that PT/OT has not followed up with pt. It appears they've been referred but nobody has followed up. RN advised pt she would relay concern and symptoms of knee pain to the appropriate people for follow-up. In the meantime, RN advised pt she needs to call 911 if she falls/hits her head or has any CP/SOB, to which pt agreed and verbalized understanding, adding she uses a life alert. Pt would like someone to reach out to her about PT/OT.  Copied from CRM [id number]. Topic: Clinical - Red Word Triage >> May 14, 2023  4:48 PM Jessica [name] wrote: Jessica [name] that prompted transfer to Nurse Triage: Patient has another fall on Thursday of last week. Her left knee is hurting her. She is waiting on home health to come out. Reason for Disposition  [1] MODERATE weakness (i.e., interferes with work, school, normal activities) AND [2] new-onset or worsening  Answer Assessment - Initial Assessment Questions 1. MECHANISM: "How did the fall happen?"     Transferring from chair to wheelchair and the wheelchair was unlocked, slipped and fell onto her bottom 3. ONSET: "When did the fall happen?" (e.g., minutes, hours, or days ago)     Last Thursday 4. LOCATION: "What part of the body hit the ground?" (e.g., back, buttocks, head, hips, knees, hands, head, stomach)  Bottom 5. INJURY: "Did you hurt (injure) yourself when you fell?" If Yes, ask: "What did you injure? Tell me more about this?" (e.g., body area; type of injury; pain severity)"     Left knee - no bruises, no lacerations, no deformities 6. PAIN: "Is there any pain?" If Yes, ask: "How bad is the pain?" (e.g., Scale 1-10; or mild,  moderate, severe)   - NONE (0): No pain   - MILD (1-3): Doesn't interfere with normal activities    -  MODERATE (4-7): Interferes with normal activities or awakens from sleep    - SEVERE (8-10): Excruciating pain, unable to do any normal activities      Mild, per daughter. Pt is putting a liquid analgesic on it at night in order to sleep. Pt is walking 4x/day at least per doctor order, but pt feels her legs are weaker than they had been. Pt believes physical therapy is important, waiting for home [name]. Pt can still bear weight on left leg 7. SIZE: For cuts, bruises, or swelling, ask: "How large is it?" (e.g., inches or centimeters)      None 9. OTHER SYMPTOMS: "Do you have any other symptoms?" (e.g., dizziness, fever, weakness; new onset or worsening).      Did not hit head. Pt told daughter she was dizzy, prompting daughter to call Friday 1/30 for a Feb appt. Worse weakness in her legs. No fever. Blood thinner not on med list, but daughter and PCP note says she is taking Eliquis . No blurry vision, no vomiting, no nausea, no headache. Daughter says pt has fallen 4 times in the last 4 weeks. Daughter concerned about falls. Pt wants home health/PT to come to "work out" her left knee and make it feel better. Pt told daughter she would not want to see a doctor. 10. CAUSE: "What do you think caused the fall (or falling)?" (e.g., tripped, dizzy spell)       Transferred to an unlocked wheelchair. Fire department came and picked her up.  Protocols used: Falls and [facility]

## 2023-05-14 NOTE — Addendum Note (Signed)
 Addended by: Hildegard Low R on: 05/14/2023 01:50 PM   Modules accepted: Orders

## 2023-05-15 ENCOUNTER — Telehealth: Payer: Self-pay | Admitting: Internal Medicine

## 2023-05-15 DIAGNOSIS — M545 Low back pain, unspecified: Secondary | ICD-10-CM

## 2023-05-15 DIAGNOSIS — M150 Primary generalized (osteo)arthritis: Secondary | ICD-10-CM

## 2023-05-15 DIAGNOSIS — W19XXXA Unspecified fall, initial encounter: Secondary | ICD-10-CM

## 2023-05-15 NOTE — Telephone Encounter (Unsigned)
Copied from CRM [id number]. Topic: Referral - Status >> May 14, 2023  4:50 PM Corin V wrote: Reason for CRM: Patient's daughter Waynetta Sandy called to follow up on her home health referral. She has had another fall since seeing Dr. Posey Rea and is wanting home health to come out as quickly as possible to start working with her mom to get her strength up.

## 2023-05-15 NOTE — Telephone Encounter (Signed)
Updated DME order has been faxed over.

## 2023-05-16 ENCOUNTER — Ambulatory Visit: Payer: Self-pay

## 2023-05-16 NOTE — Patient Outreach (Unsigned)
  Care Coordination   Follow Up Visit Note   05/16/2023 Name: Jessica [name] MRN: [id number] DOB: [birth year]  Jessica [name] is a 89 y.o. year old female who sees Plotnikov, Georgina Quint, MD for primary care. I spoke with  [name] by phone today.  What matters to the patients health and wellness today?  RNCM called to follow up with patient regarding start of care for home health. Patient reports she has not heard from a home health agency. [name] states she has been using Voltaren gel on her knee and states her knee if beginning to feel better. RNCM called to PCP office to speak with referral coordinators to assess for barriers regarding home health referral and to assist as needed. Awaiting return call.  Goals Addressed             This Visit's Progress    Care Coordination-assist with health management       Interventions Today    Flowsheet Row Most Recent Value  Chronic Disease   Chronic disease during today's visit Other  General Interventions   General Interventions Discussed/Reviewed General Interventions Reviewed, Communication with  [follow up-assessed if patient has heard from home health for start of care date.]  Communication with PCP/Specialists  [message to PCP to clarify that Medicare may cover bath aide if skilled service such as PT/OT is involved. plan to get PT/OT recommendation once they evaluate patient. Also called PCP office to follow up on referral for home health. spoke with W[place]ria.]             SDOH assessments and interventions completed:  No{THN Tip this will not be part of the note when signed-REQUIRED REPORT FIELD DO NOT DELETE (Optional):27901}  Care Coordination Interventions:  Yes, provided {THN Tip this will not be part of the note when signed-REQUIRED REPORT FIELD DO NOT DELETE (Optional):27901}  Follow up plan:  continue to follow    Encounter Outcome:  Patient Visit Completed {THN Tip this will not be part of the note  when signed-REQUIRED REPORT FIELD DO NOT DELETE (Optional):27901}  [name], RN, MSN, BSN, CCM [place]  [facility], Population Health Case Manager Phone: [phone]

## 2023-05-16 NOTE — Telephone Encounter (Signed)
Copied from CRM [id number]. Topic: General - Other >> May 16, 2023  3:01 PM Gurney Maxin H wrote: Reason for CRM: Lana calling to follow up on Home Health order for patient, please reach out to her regarding status.  [facility] Care Manager [facility] [phone]

## 2023-05-17 ENCOUNTER — Other Ambulatory Visit (HOSPITAL_BASED_OUTPATIENT_CLINIC_OR_DEPARTMENT_OTHER): Payer: Self-pay | Admitting: Family

## 2023-05-17 ENCOUNTER — Ambulatory Visit: Payer: Self-pay

## 2023-05-17 NOTE — Telephone Encounter (Signed)
Se needs to contact [facility] Thx

## 2023-05-17 NOTE — Patient Instructions (Signed)
Visit Information  Thank you for taking time to visit with me today. Please don't hesitate to contact me if I can be of assistance to you.   Following are the goals we discussed today:  If your condition worsens or does not improve, seek medical attention at Urgent care or ED. You have an appointment at your Primary Care Provider office with Beather Arbour on 05/23/23 at 10:40 am.   If you are experiencing a Mental Health or Behavioral Health Crisis or need someone to talk to, please call the Suicide and Crisis Lifeline: 988 call the [place] National Suicide Prevention Lifeline: 217-727-5425 or TTY: (562)355-2044 TTY 434-431-3915) to talk to a trained counselor  [name], RN, MSN, BSN, CCM [place]  [name], Population Health Case Manager Phone: [phone]

## 2023-05-17 NOTE — Telephone Encounter (Signed)
I'm sorry. I'm not sure what we can do at this point. Me or one of my partners can see the pt in the office and X ray the knee. Thx

## 2023-05-17 NOTE — Telephone Encounter (Signed)
A care coordinator from [facility] called and said [facility] said they have not received the referral and would like to know if it can be re-sent.

## 2023-05-17 NOTE — Patient Outreach (Signed)
  Care Coordination   Care Coordination  Visit Note   05/17/2023 Name: Jessica [name] MRN: [id number] DOB: [birth year]  Jessica [name] is a 89 y.o. year old female who sees Plotnikov, Georgina Quint, MD for primary care.   RNCM called [facility] to see if they have received home health referral-Spoke with [facility] who states that they have not received the referral for home health. RNCM reached out to PCP office again-Spoke with Jessica [name] who states that referral was seen in epic. RNCM reiterated that per [facility], they did not receive the referral. Jessica [name] reports she will request the referral be sent again.   RNCM called and spoke with patient. Jessica [name] reports she had another fall last night and had to have emergency personnel help get her up. She states she declined their offer to go to the ED. Per patient, she has had 3 falls since 05/07/23. She states she does not think she has any injuries. But she notices some pain "in shin area" when sitting in her chair.  Patient agreeable to follow up with a provider and expressed her preference was to follow up with her PCP office and is willing to see someone else in the office for a sooner appointment.   Goals Addressed             This Visit's Progress    Care Coordination-assist with health management       Interventions Today    Flowsheet Row Most Recent Value  Chronic Disease   Chronic disease during today's visit Other  General Interventions   General Interventions Discussed/Reviewed Communication with  Communication with PCP/Specialists  [facility] called PCP to notify that [facility] has not received referral. request to send Home health referral out again. arranged appointment for patient to see Beather Arbour 05/23/23 at 10:40 am. voice message left for patient. PCP updated.]  Education Interventions   Education Provided Provided Education  [discussed importance of being evaluated post falls, encouraged patient to seek medical attention.]   Provided Verbal Education On --  [advised patient to contact provider and/or seek urgent care if condition worsens.]  Safety Interventions   Safety Discussed/Reviewed Fall Risk, Safety Reviewed, Home Safety  [reiterated fall prevention strategies]  Home Safety Contact home health agency, Assistive Devices, Contact provider for referral to PT/OT  [facility] called [facility] to see if they had received HH referral.]             SDOH assessments and interventions completed:  No  Care Coordination Interventions:  Yes, provided   Follow up plan:  continue to follow    Encounter Outcome:  Patient Visit Completed   [name], RN, MSN, BSN, CCM [place]  [facility], Population Health Case Manager Phone: [phone]

## 2023-05-18 ENCOUNTER — Telehealth: Payer: Self-pay

## 2023-05-18 NOTE — Patient Outreach (Signed)
  Care Coordination   Follow Up Visit Note   05/18/2023 Name: Jessica [name] MRN: [id number] DOB: June 28, 1933  Jessica [name] is a 89 y.o. year old female who sees Plotnikov, Georgina Quint, MD for primary care. I spoke with  [name] by phone today.  What matters to the patients health and wellness today?  RNCM called to follow up with patient-Confirmed patient has transportation. Jessica [name] states she received the message. She states her niece has been to see her today and that her niece is going to take her to the appointment next week. Jessica [name] reports that she took ibuprofen today for the leg pain and states it has helped.   Goals Addressed             This Visit's Progress    Care Coordination-assist with health management       Interventions Today    Flowsheet Row Most Recent Value  General Interventions   General Interventions Discussed/Reviewed General Interventions Reviewed  [called to confirm patient aware of follow up appointment and has transportation.]  Safety Interventions   Safety Discussed/Reviewed Safety Reviewed, Fall Risk, Home Safety  [has life alert necklace, advised to continue fall prevention strategies.]  Home Safety Assistive Devices             SDOH assessments and interventions completed:  Yes  Care Coordination Interventions:  Yes, provided   Follow up plan: Follow up call scheduled for 06/01/23    Encounter Outcome:  Patient Visit Completed   [name], RN, MSN, BSN, CCM [place]  [facility], Population Health Case Manager Phone: [phone]

## 2023-05-18 NOTE — Patient Instructions (Addendum)
Visit Information  Thank you for taking time to visit with me today. Please don't hesitate to contact me if I can be of assistance to you.   Following are the goals we discussed today:  Fall prevention strategies You have an appointment at your Primary Care Provider office with Beather Arbour on 05/23/23 at 10:40 am Contact your provider with health questions or concerns. Seek medical attention if your condition worsens.   Our next appointment is by telephone on 06/01/23 at 10:30 am.  Please call the care guide team at [phone] if you need to cancel or reschedule your appointment.   If you are experiencing a Mental Health or Behavioral Health Crisis or need someone to talk to, please call the Suicide and Crisis Lifeline: 988 call the [place] National Suicide Prevention Lifeline: (857)656-4467 or TTY: (270) 379-8665 TTY 657 495 8237) to talk to a trained counselor   [name], RN, MSN, BSN, CCM [place]  [facility], Population Health Case Manager Phone: [phone]

## 2023-05-19 NOTE — Telephone Encounter (Signed)
Pls re-send ref from 05/07/23 Thx

## 2023-05-21 ENCOUNTER — Ambulatory Visit: Payer: Self-pay

## 2023-05-21 ENCOUNTER — Ambulatory Visit: Payer: Self-pay | Admitting: Licensed Clinical Social Worker

## 2023-05-21 NOTE — Patient Instructions (Signed)
Visit Information  Thank you for taking time to visit with me today. Please don't hesitate to contact me if I can be of assistance to you.   Following are the goals we discussed today:   Goals Addressed             This Visit's Progress    Client spoke of in home care needs. She uses wheelchair to help with ambulation       Interventions:  Spoke with client via phone today about client needs and status Client has had 3 falls recently. However, she said she has had no serious injuries from these falls. Client is scheduled to have appointment on 05/23/23 at 10:40 AM with Hetty Blend, NP. Client said her niece is scheduled to transport client to and from client appointment on 05/23/23 with Hetty Blend, NP Discussed sleeping issues of client. Discussed pain issues of client. Client said she has some shoulder pain Client does have Life Alert system and is glad she has Life Alert.  She has used Heritage manager as needed Discussed level of care issues with Zollie. Discussed perhaps client staying with a relative or receiving more help from a friend or relative.  Chonte discussed that her main support is from her niece who resides in [place] , [place]. Discussed ALF care for client as possible resource.  LCSW suggested that since client was falling more and needed more ADL help perhaps ALF could be helpful. Andreana said she did not want to go to SNF or ALF; but, rather, she wants to continue to reside at her home with supports in place. [name] RN informed LCSW today that PCP had ordered HHPT and HHOT for client and sent order to [facility].  LCSW  informed Talecia of this information today. Clara is glad order was sent and she thinks HHPT and HHOT will be helpful to her Discussed DME of client. She has wheelchair, walker, and a rollator walker Chandrika said that her niece, [name], visited [place] last week. She said her niece is scheduled to visit her this Wednesday and transport client to  and from client appointment with Hetty Blend, NP LCSW collaborated today with RN [name] about client care needs Provided counseling support for client Encouraged client to call LCSW as needed at [phone] to discuss SW needs of client. Client was appreciative of call today from LCSW          LCSW has provided client with LCSW name and phone number and encouraged client to call LCSW as needed for SW support  Please call the care guide team at [phone] if you need to cancel or reschedule your appointment.   If you are experiencing a Mental Health or Behavioral Health Crisis or need someone to talk to, please go to [facility] [address], [place] [phone])   The patient verbalized understanding of instructions, educational materials, and care plan provided today and DECLINED offer to receive copy of patient instructions, educational materials, and care plan.   The patient has been provided with contact information for the care management team and has been advised to call with any health related questions or concerns.    [name]  MSW, LCSW [place] Licensed Clinical Social Worker Direct Dial:  [phone] Fax:  [phone] Website:  [email or web address]

## 2023-05-21 NOTE — Patient Outreach (Signed)
Care Coordination   Follow Up Visit Note   05/21/2023 Name: Jessica [name] MRN: [id number] DOB: [birth year]  Jessica [name] is a 89 y.o. year old female who sees Plotnikov, Georgina Quint, MD for primary care. I spoke with  Jessica [name] by phone today.  What matters to the patients health and wellness today? Client spoke of in home care needs. She uses wheelchair to help with ambulation     Goals Addressed             This Visit's Progress    Client spoke of in home care needs. She uses wheelchair to help with ambulation       Interventions:  Spoke with client via phone today about client needs and status Client has had 3 falls recently. However, she said she has had no serious injuries from these falls. Client is scheduled to have appointment on 05/23/23 at 10:40 AM with Hetty Blend, NP. Client said her niece is scheduled to transport client to and from client appointment on 05/23/23 with Hetty Blend, NP Discussed sleeping issues of client. Discussed pain issues of client. Client said she has some shoulder pain Client does have Life Alert system and is glad she has Life Alert.  She has used Heritage manager as needed Discussed level of care issues with Jessica [name]. Discussed perhaps client staying with a relative or receiving more help from a friend or relative.  Jessica [name] discussed that her main support is from her niece who resides in [place] , [place]. Discussed ALF care for client as possible resource.  LCSW suggested that since client was falling more and needed more ADL help perhaps ALF could be helpful. Jessica [name] said she did not want to go to SNF or ALF; but, rather, she wants to continue to reside at her home with supports in place. Jessica Sheriff RN informed LCSW today that PCP had ordered HHPT and HHOT for client and sent order to [facility].  LCSW  informed Jessica [name] of this information today. Jessica [name] is glad order was sent and she thinks HHPT and HHOT will be helpful to  her Discussed DME of client. She has wheelchair, walker, and a rollator walker Jessica [name] said that her niece, Jessica [name], visited [place] last week. She said her niece is scheduled to visit her this Wednesday and transport client to and from client appointment with Hetty Blend, NP LCSW collaborated today with RN Jessica [name] about client care needs Provided counseling support for client Encouraged client to call LCSW as needed at [phone] to discuss SW needs of client. Client was appreciative of call today from LCSW           SDOH assessments and interventions completed:  Yes  SDOH Interventions Today    Flowsheet Row Most Recent Value  SDOH Interventions   Depression Interventions/Treatment  Counseling  Physical Activity Interventions Other (Comments)  [at risk for falls]  Stress Interventions Provide Counseling  [client has stress in managing ADLs and daily needs. Risk for falls. Niece, who provides some support, resides in [place]]        Care Coordination Interventions:  Yes, provided   Interventions Today    Flowsheet Row Most Recent Value  Chronic Disease   Chronic disease during today's visit Other  [spoke with client about client needs]  General Interventions   General Interventions Discussed/Reviewed General Interventions Discussed, Community Resources  Education Interventions   Education Provided Provided Education  Provided Engineer, [employer]  Mental Health Interventions  Mental Health Discussed/Reviewed Coping Strategies  [client has anxiety related to risk of falls. Has decreased family support.]  Nutrition Interventions   Nutrition Discussed/Reviewed Nutrition Discussed  Pharmacy Interventions   Pharmacy Dicussed/Reviewed Pharmacy Topics Discussed  Safety Interventions   Safety Discussed/Reviewed Fall Risk        Follow up plan: LCSW has provided client with LCSW name and phone number and encouraged Jazalynn to call LCSW as  needed for SW support   Encounter Outcome:  Patient Visit Completed    [name]  MSW, LCSW [place]/[facility] Licensed Clinical Social Worker Direct Dial:  [phone] Fax:  [phone] Website:  [email or web address]

## 2023-05-21 NOTE — Patient Outreach (Signed)
  Care Coordination      Visit Note   05/21/2023 Name: KLARA STJAMES MRN: [id number] DOB: 1933/12/03  BLUMA BURESH is a 89 y.o. year old female who sees Plotnikov, Georgina Quint, MD for primary care. No patient contact was made during this encounter.  Care Coordination regarding patient fall status.    Goals Addressed             This Visit's Progress    Care Coordination-assist with health management       Interventions Today    Flowsheet Row Most Recent Value  General Interventions   General Interventions Discussed/Reviewed Communication with, Level of Care  Communication with Social Work  [facility] updated [name], LCSW re: patient falls and per PCP-patient may need to consider assisted living or moving in with family, which has been discussed in the past by CM care team. Will continue to address.]             SDOH assessments and interventions completed:  No  Care Coordination Interventions:  Yes, provided   Follow up plan:  LCSW to follow up with patient today as scheduled. RNCM will continue to follow    Encounter Outcome:  Patient Visit Completed   [name], RN, MSN, BSN, CCM [place]  [facility], Population Health Case Manager Phone: [phone]

## 2023-05-22 ENCOUNTER — Ambulatory Visit (INDEPENDENT_AMBULATORY_CARE_PROVIDER_SITE_OTHER): Payer: Medicare Other

## 2023-05-22 ENCOUNTER — Ambulatory Visit (HOSPITAL_BASED_OUTPATIENT_CLINIC_OR_DEPARTMENT_OTHER): Payer: Medicare Other | Admitting: Family

## 2023-05-22 DIAGNOSIS — I442 Atrioventricular block, complete: Secondary | ICD-10-CM | POA: Diagnosis not present

## 2023-05-23 ENCOUNTER — Ambulatory Visit: Payer: Medicare Other | Admitting: Family Medicine

## 2023-05-24 NOTE — Telephone Encounter (Signed)
Copied from CRM [id number]. Topic: General - Other >> May 24, 2023 11:10 AM Isabell A wrote: Reason for CRM: Daughter Beth calling in regard to referral for [facility], states she's been waiting and haven't heard from anyone.    Callback number: [phone]

## 2023-05-25 ENCOUNTER — Ambulatory Visit (HOSPITAL_BASED_OUTPATIENT_CLINIC_OR_DEPARTMENT_OTHER): Payer: Medicare Other | Admitting: Family

## 2023-05-25 LAB — CUP PACEART REMOTE DEVICE CHECK
Battery Impedance: 1995 Ohm
Battery Remaining Longevity: 35 mo
Battery Voltage: 2.76 V
Brady Statistic AP VP Percent: 9 %
Brady Statistic AP VS Percent: 0 %
Brady Statistic AS VP Percent: 90 %
Brady Statistic AS VS Percent: 1 %
Date Time Interrogation Session: 20250220113521
Implantable Lead Connection Status: 753985
Implantable Lead Connection Status: 753985
Implantable Lead Implant Date: 20140710
Implantable Lead Implant Date: 20140710
Implantable Lead Location: 753859
Implantable Lead Location: 753860
Implantable Lead Model: 5092
Implantable Lead Model: 5592
Implantable Pulse Generator Implant Date: 20140710
Lead Channel Impedance Value: 491 Ohm
Lead Channel Impedance Value: 824 Ohm
Lead Channel Pacing Threshold Amplitude: 0.5 V
Lead Channel Pacing Threshold Amplitude: 0.625 V
Lead Channel Pacing Threshold Pulse Width: 0.4 ms
Lead Channel Pacing Threshold Pulse Width: 0.4 ms
Lead Channel Setting Pacing Amplitude: 2 V
Lead Channel Setting Pacing Amplitude: 2.5 V
Lead Channel Setting Pacing Pulse Width: 0.4 ms
Lead Channel Setting Sensing Sensitivity: 4 mV
Zone Setting Status: 755011
Zone Setting Status: 755011

## 2023-05-25 NOTE — Telephone Encounter (Signed)
Copied from CRM [id number]. Topic: General - Other >> May 25, 2023  1:59 PM Elizebeth Brooking wrote: Reason for CRM: Patient daughter called in stating that she would like for someone to give her a call in regards to the referral that was sent out for home health for the patient the callback number is  [phone]

## 2023-05-28 ENCOUNTER — Ambulatory Visit: Payer: Medicare Other | Admitting: Internal Medicine

## 2023-05-28 NOTE — Telephone Encounter (Signed)
 I was able to send a message to the referrals department to check on this as the referral has been sent on 05/07/2023.

## 2023-05-29 ENCOUNTER — Ambulatory Visit: Payer: Medicare Other | Admitting: Internal Medicine

## 2023-05-29 ENCOUNTER — Encounter: Payer: Self-pay | Admitting: Cardiology

## 2023-05-29 NOTE — Telephone Encounter (Signed)
 Copied from CRM [id number]. Topic: Referral - Status >> May 29, 2023 10:39 AM [place] J wrote: Reason for CRM: Patient's niece calling in regarding referral to [employer]. [facility] is stating that they haven't received anything. Referral was originally submitted 05/07/2023. ---  Called Niece, [name], and let her know that the referral is still pending due to insurance. [name] expressed understanding.

## 2023-05-31 NOTE — Telephone Encounter (Signed)
 Okay a new order. Do I need to place it in epic?  Is it for PT/OT? Thanks

## 2023-06-01 ENCOUNTER — Ambulatory Visit: Payer: Self-pay

## 2023-06-01 NOTE — Patient Outreach (Signed)
 Care Coordination   Follow Up Visit Note   06/01/2023 Name: Jessica [name] MRN: [id number] DOB: [birth year]  Jessica [name] is a 89 y.o. year old female who sees Plotnikov, Georgina Quint, MD for primary care. I spoke with  [name] by phone today.  What matters to the patients health and wellness today?  Ms. Uzelac reports she is doing ok and has not has any falls in the past two weeks. However, she states she has heard from home health agency for services.   Goals Addressed             This Visit's Progress    Care Coordination-assist with health management       Interventions Today    Flowsheet Row Most Recent Value  Chronic Disease   Chronic disease during today's visit Chronic Obstructive Pulmonary Disease (COPD), Other, Chronic Kidney Disease/End Stage Renal Disease (ESRD)  [falls, decreased mobility]  General Interventions   General Interventions Discussed/Reviewed General Interventions Reviewed, Doctor Visits, Level of Care  [Evaluation of current treatment plan for health condition and patient's adherence to plan.]  Doctor Visits Discussed/Reviewed PCP  PCP/Specialist Visits Compliance with follow-up visit  [reviewed upcoming scheduled appointments with patient]  Level of Care --  [discussed level of care. patient declines higher level of care. states plans to stay in her home. RNCM encouraged patient to think about future plans regarding self care/care and what that may look like.]  Exercise Interventions   Exercise Discussed/Reviewed Exercise Discussed  [discussed if patient has previous sitting exercises provided by [facility] in the past-advised to perform sitting exercises previously provided by home health therapy in the past to continue to work on muscle strength/tone.]  Education Interventions   Education Provided Provided Education  Provided Verbal Education On Medication, When to see the doctor, Other, Nutrition  [advised to continue to eat healthy, maintain  fall prevention strategies, contact provider with health questions or concern, take medications as prescribed.]  Pharmacy Interventions   Pharmacy Dicussed/Reviewed Pharmacy Topics Reviewed  Safety Interventions   Safety Discussed/Reviewed Safety Reviewed, Fall Risk  Home Safety Contact provider for referral to PT/OT, Contact home health agency  [facility] called [facility] and confirmed they have not received referral placed on 05/07/23.Marland Kitchenmessage to provider to send referral to home health agency. patient with no preferance. however. fax #[phone] obtained from [facility] also provided to PCP]             SDOH assessments and interventions completed:  No  Care Coordination Interventions:  Yes, provided   Follow up plan: Follow up call scheduled for 06/11/23    Encounter Outcome:  Patient Visit Completed   [name], RN, MSN, BSN, CCM [place]  [facility], Population Health Case Manager Phone: [phone]

## 2023-06-01 NOTE — Patient Instructions (Addendum)
 Visit Information  Thank you for taking time to visit with me today. Please don't hesitate to contact me if I can be of assistance to you.   Following are the goals we discussed today:  Continue fall prevention strategies Continue to take medications as prescribed. Continue to attend provider visits as scheduled Continue to eat healthy, lean meats, vegetables, fruits, avoid saturated and transfats Contact provider with health questions or concerns as needed  Our next appointment is by telephone on 06/11/23 at 1:30 pm  Please call the care guide team at [phone] if you need to cancel or reschedule your appointment.   If you are experiencing a Mental Health or Behavioral Health Crisis or need someone to talk to, please call the Suicide and Crisis Lifeline: 988 call the [place] National Suicide Prevention Lifeline: 2313996283 or TTY: 9711281291 TTY 276-771-3140) to talk to a trained counselor   [name], RN, MSN, BSN, CCM [place]  [facility], Population Health Case Manager Phone: [phone]   Fall Prevention in the Home Falls can cause injuries and affect people of all ages. There are many simple things that you can do to make your home safe and to help prevent falls. If you need it, ask for help making these changes. What actions can I take to prevent falls? General information Use good lighting in all rooms. Make sure to: Replace any light bulbs that burn out. Turn on lights if it is dark and use night-lights. Keep items that you use often in easy-to-reach places. Lower the shelves around your home if needed. Move furniture so that there are clear paths around it. Do not keep throw rugs or other things on the floor that can make you trip. If any of your floors are uneven, fix them. Add color or contrast paint or tape to clearly mark and help you see: Grab bars or handrails. First and last steps of staircases. Where the edge of each step is. If  you use a ladder or stepladder: Make sure that it is fully opened. Do not climb a closed ladder. Make sure the sides of the ladder are locked in place. Have someone hold the ladder while you use it. Know where your pets are as you move through your home. What can I do in the bathroom?     Keep the floor dry. Clean up any water that is on the floor right away. Remove soap buildup in the bathtub or shower. Buildup makes bathtubs and showers slippery. Use non-skid mats or decals on the floor of the bathtub or shower. Attach bath mats securely with double-sided, non-slip rug tape. If you need to sit down while you are in the shower, use a non-slip stool. Install grab bars by the toilet and in the bathtub and shower. Do not use towel bars as grab bars. What can I do in the bedroom? Make sure that you have a light by your bed that is easy to reach. Do not use any sheets or blankets on your bed that hang to the floor. Have a firm bench or chair with side arms that you can use for support when you get dressed. What can I do in the kitchen? Clean up any spills right away. If you need to reach something above you, use a sturdy step stool that has a grab bar. Keep electrical cables out of the way. Do not use floor polish or wax that makes floors slippery. What can I do with my stairs? Do not  leave anything on the stairs. Make sure that you have a light switch at the top and the bottom of the stairs. Have them installed if you do not have them. Make sure that there are handrails on both sides of the stairs. Fix handrails that are broken or loose. Make sure that handrails are as long as the staircases. Install non-slip stair treads on all stairs in your home if they do not have carpet. Avoid having throw rugs at the top or bottom of stairs, or secure the rugs with carpet tape to prevent them from moving. Choose a carpet design that does not hide the edge of steps on the stairs. Make sure that carpet  is firmly attached to the stairs. Fix any carpet that is loose or worn. What can I do on the outside of my home? Use bright outdoor lighting. Repair the edges of walkways and driveways and fix any cracks. Clear paths of anything that can make you trip, such as tools or rocks. Add color or contrast paint or tape to clearly mark and help you see high doorway thresholds. Trim any bushes or trees on the main path into your home. Check that handrails are securely fastened and in good repair. Both sides of all steps should have handrails. Install guardrails along the edges of any raised decks or porches. Have leaves, snow, and ice cleared regularly. Use sand, salt, or ice melt on walkways during winter months if you live where there is ice and snow. In the garage, clean up any spills right away, including grease or oil spills. What other actions can I take? Review your medicines with your health care provider. Some medicines can make you confused or feel dizzy. This can increase your chance of falling. Wear closed-toe shoes that fit well and support your feet. Wear shoes that have rubber soles and low heels. Use a cane, walker, scooter, or crutches that help you move around if needed. Talk with your provider about other ways that you can decrease your risk of falls. This may include seeing a physical therapist to learn to do exercises to improve movement and strength. Where to find more information Centers for Disease Control and Prevention, STEADI: TonerPromos.no [employer] on Aging: BaseRingTones.pl National Institute on Aging: BaseRingTones.pl Contact a health care provider if: You are afraid of falling at home. You feel weak, drowsy, or dizzy at home. You fall at home. Get help right away if you: Lose consciousness or have trouble moving after a fall. Have a fall that causes a head injury. These symptoms may be an emergency. Get help right away. Call 911. Do not wait to see if the symptoms will go  away. Do not drive yourself to the hospital. This information is not intended to replace advice given to you by your health care provider. Make sure you discuss any questions you have with your health care provider. Document Revised: 11/21/2021 Document Reviewed: 11/21/2021 Elsevier Patient Education  2024 [employer].

## 2023-06-04 NOTE — Telephone Encounter (Signed)
 Done. Thanks.

## 2023-06-04 NOTE — Addendum Note (Signed)
 Addended by: Tresa Garter on: 06/04/2023 08:29 PM   Modules accepted: Orders

## 2023-06-11 ENCOUNTER — Ambulatory Visit: Payer: Self-pay

## 2023-06-11 DIAGNOSIS — N183 Chronic kidney disease, stage 3 unspecified: Secondary | ICD-10-CM

## 2023-06-11 DIAGNOSIS — E118 Type 2 diabetes mellitus with unspecified complications: Secondary | ICD-10-CM

## 2023-06-11 NOTE — Patient Outreach (Signed)
 Care Coordination   Care Coordination  Visit Note   06/11/2023 Name: Jessica [name] MRN: [id number] DOB: [birth year]  Jessica [name] is a 89 y.o. year old female who sees Plotnikov, Georgina Quint, MD for primary care. I  spoke with Primary Care Provider referral coordinator, Delice Bison.  RNCM received return call from Referral Coorinator, Delice Bison. Per Delice Bison, the referral is pending in the system and does not go out to an agency until approved by insurance. She reports they are awaiting insurance approval.   Goals Addressed             This Visit's Progress    Care Coordination-assist with health management       Interventions Today    Flowsheet Row Most Recent Value  General Interventions   General Interventions Discussed/Reviewed Communication with  Communication with PCP/Specialists  [communication with PCP referral coordinator, Delice Bison. RNCM also discussed case with VBCI Manager re: barriers.]           SDOH assessments and interventions completed:  No  Care Coordination Interventions:  Yes, provided   Follow up plan:  Discuss barriers with P. Arlana Pouch, Production designer, theatre/television/film. RNCM will continue to follow    Encounter Outcome:  Patient Visit Completed   [name], RN, MSN, BSN, CCM [place]  [facility], Population Health Case Manager Phone: [phone]

## 2023-06-11 NOTE — Patient Instructions (Addendum)
 Visit Information  Thank you for taking time to visit with me today. Please don't hesitate to contact me if I can be of assistance to you.   Following are the goals we discussed today:  Continue fall preventions strategies: change position slowly, use your assistive device/wheelchair, make sure walkways are clear, have good lighting, continue to perform exercises provided by previous therapist Continue to take medications as prescribed. Continue to attend provider visits as scheduled Continue to eat healthy, lean meats, vegetables, fruits, avoid saturated and transfats Contact provider with health questions or concerns as needed Continue to check blood sugar as recommended and notify provider if questions or concerns  Our next appointment is by telephone on 06/19/23 at 11:45 am  Please call the care guide team at [phone] if you need to cancel or reschedule your appointment.   If you are experiencing a Mental Health or Behavioral Health Crisis or need someone to talk to, please call the Suicide and Crisis Lifeline: 60   [name], RN, MSN, BSN, CCM [employer], Population Health Case Manager Phone: [phone]

## 2023-06-11 NOTE — Telephone Encounter (Signed)
 Copied from CRM [id number]. Topic: Referral - Status >> Jun 11, 2023  1:55 PM Orinda Kenner C wrote: Reason for CRM: [name] case manager from [facility] [phone] is trying to touch base with the referral coordinator for Dr. Kelli Churn on patient's home health. Patient still has not heard from the agency. Donn Pierini contact the [facility], and Donn Pierini spoke with [place] at [employer] does not see a referral for patient. Patient is willing to go to any home health services. Per CAL, [employer] is the Armed forces training and education officer. Please advise and call back.  ---  Spoke with Donn Pierini and stated that the referral is still pending. Once we have authorization we will send the referral.orders to the appropriate location.

## 2023-06-11 NOTE — Patient Outreach (Signed)
 Care Coordination   Care Coordination  Visit Note   06/11/2023 Name: Jessica [name] MRN: [id number] DOB: February 22, 1934  Jessica [name] is a 89 y.o. year old female who sees [name], Jessica Quint, MD for primary care. I spoke with  Jessica [name] and Jessica [name](dpr) by phone today.  RNCM followed up with patient to recommend a three way call with patient, insurance company and RNCM. Patient request for RNCM to contact her niece, Jessica [name]. Per Ms. [name], she spoke with [facility] this past Friday and states it is in the approval process and she should hear something within 24-72 hours. Niece to call [facility], if she does not hear anything by noon-close of Wednesday. RNCM will follow up with Niece on Thursday.   Goals Addressed             This Visit's Progress    Care Coordination-assist with health management       Interventions Today    Flowsheet Row Most Recent Value  General Interventions   General Interventions Discussed/Reviewed General Interventions Reviewed  [care coordination with patient/neice regarding [employer] authorization for home health]           SDOH assessments and interventions completed:  No  Care Coordination Interventions:  Yes, provided   Follow up plan:  follow up on 06/14/23    Encounter Outcome:  Patient Visit Completed   [name], RN, MSN, BSN, CCM [place]  [facility], Population Health Case Manager Phone: [phone]

## 2023-06-11 NOTE — Patient Outreach (Signed)
 Care Coordination   Follow Up Visit Note   06/11/2023 Name: Jessica [name] MRN: [id number] DOB: [birth year]  Jessica [name] is a 89 y.o. year old female who sees [name], Jessica Quint, MD for primary care. I spoke with  Jessica [name] by phone today.  What matters to the patients health and wellness today?  RNCM called to follow up . Patient reports she has not fallen since last phone assessment with RNCM. She reports she has been using Voltaren gel nightly and this is helping some. Per review of chart another referral for Home health was placed on 06/04/22. Patient states she has not heard from a home health agency. RNCM spoke with Jessica [name] at [facility]: [place] states she is unable to see the referral in epic, but reports the sales [name] is able to review orders in epic, and will ask the sales [name] to follow up. RNCM is awaiting return call from Jessica [name]. RNCM also called PCP referral coordinator and left message with Jessica [name] (home health not started, patient is receptive to any agency that can provide the service, [facility] reports has not received referral, request a return call to [facility]. Patient on occasion has had some difficulty with transportation to appointments. She relies on her niece and son in law. Patient is not interested in switching to a home based care provider. Patient states she does not have Jardiance- She reports she had assistance last year, but "I don't know what happened to it". Referral to clinical pharmacist.  Goals Addressed             This Visit's Progress    Care Coordination-assist with health management       Interventions Today    Flowsheet Row Most Recent Value  Chronic Disease   Chronic disease during today's visit Diabetes, Chronic Obstructive Pulmonary Disease (COPD), Chronic Kidney Disease/End Stage Renal Disease (ESRD), Congestive Heart Failure (CHF), Hypertension (HTN), Atrial Fibrillation (AFib)  General Interventions   General Interventions  Discussed/Reviewed General Interventions Reviewed, Doctor Visits  [Evaluation of current treatment plan for health condition and patient's adherence to plan. assessed overall health status.]  Doctor Visits Discussed/Reviewed Specialist, PCP, Annual Wellness Visits, Doctor Visits Reviewed  PCP/Specialist Visits Compliance with follow-up visit  [review up upcoming appointments with patient. discussed home based care provider benefit-patient declines stating she does not want to change primary care providers.]  Exercise Interventions   Exercise Discussed/Reviewed Exercise Reviewed  [advised to continue to perform exercises provided by previous home health therapist to maintain muscle strength and tone]  Education Interventions   Provided Verbal Education On When to see the doctor, Mental Health/Coping with Illness, Medication, Exercise  Nutrition Interventions   Nutrition Discussed/Reviewed Nutrition Reviewed  Pharmacy Interventions   Pharmacy Dicussed/Reviewed Pharmacy Topics Reviewed, Affording Medications, Referral to Pharmacist  [medication review completed. patient states she does not have jardiance. referral to clinical pharmacist completed.]  Referral to Pharmacist Cannot afford medications  [Jardiance. patient states she had assistance last year and received samples, but does not know what happened that she does not have the medication now.]  Safety Interventions   Safety Discussed/Reviewed Safety Reviewed, Fall Risk  Home Safety Assistive Devices, Contact home health agency  [place] [facility] to follow up and message left for PCP referral coordinator to follow up on referral for PT/OT/bath aid. also messaged referral coordinator that patient is ok with any agency that can provide the services.]           SDOH assessments  and interventions completed:  No  Care Coordination Interventions:  Yes, provided   Follow up plan: Follow up call scheduled for 06/19/23    Encounter  Outcome:  Patient Visit Completed   [name], RN, MSN, BSN, CCM [place]  [facility], Population Health Case Manager Phone: [phone]

## 2023-06-14 ENCOUNTER — Telehealth: Payer: Self-pay

## 2023-06-14 DIAGNOSIS — K219 Gastro-esophageal reflux disease without esophagitis: Secondary | ICD-10-CM | POA: Diagnosis not present

## 2023-06-14 DIAGNOSIS — R42 Dizziness and giddiness: Secondary | ICD-10-CM | POA: Diagnosis not present

## 2023-06-14 DIAGNOSIS — R410 Disorientation, unspecified: Secondary | ICD-10-CM | POA: Diagnosis not present

## 2023-06-14 DIAGNOSIS — I4892 Unspecified atrial flutter: Secondary | ICD-10-CM | POA: Diagnosis not present

## 2023-06-14 DIAGNOSIS — N183 Chronic kidney disease, stage 3 unspecified: Secondary | ICD-10-CM | POA: Diagnosis not present

## 2023-06-14 DIAGNOSIS — I480 Paroxysmal atrial fibrillation: Secondary | ICD-10-CM | POA: Diagnosis not present

## 2023-06-14 DIAGNOSIS — Z9181 History of falling: Secondary | ICD-10-CM | POA: Diagnosis not present

## 2023-06-14 DIAGNOSIS — E1122 Type 2 diabetes mellitus with diabetic chronic kidney disease: Secondary | ICD-10-CM | POA: Diagnosis not present

## 2023-06-14 DIAGNOSIS — E7800 Pure hypercholesterolemia, unspecified: Secondary | ICD-10-CM | POA: Diagnosis not present

## 2023-06-14 DIAGNOSIS — Z7984 Long term (current) use of oral hypoglycemic drugs: Secondary | ICD-10-CM | POA: Diagnosis not present

## 2023-06-14 DIAGNOSIS — J449 Chronic obstructive pulmonary disease, unspecified: Secondary | ICD-10-CM | POA: Diagnosis not present

## 2023-06-14 DIAGNOSIS — Z7982 Long term (current) use of aspirin: Secondary | ICD-10-CM | POA: Diagnosis not present

## 2023-06-14 DIAGNOSIS — M1990 Unspecified osteoarthritis, unspecified site: Secondary | ICD-10-CM | POA: Diagnosis not present

## 2023-06-14 DIAGNOSIS — Z8616 Personal history of COVID-19: Secondary | ICD-10-CM | POA: Diagnosis not present

## 2023-06-14 DIAGNOSIS — E039 Hypothyroidism, unspecified: Secondary | ICD-10-CM | POA: Diagnosis not present

## 2023-06-14 DIAGNOSIS — Z86711 Personal history of pulmonary embolism: Secondary | ICD-10-CM | POA: Diagnosis not present

## 2023-06-14 DIAGNOSIS — Z950 Presence of cardiac pacemaker: Secondary | ICD-10-CM | POA: Diagnosis not present

## 2023-06-14 DIAGNOSIS — I130 Hypertensive heart and chronic kidney disease with heart failure and stage 1 through stage 4 chronic kidney disease, or unspecified chronic kidney disease: Secondary | ICD-10-CM | POA: Diagnosis not present

## 2023-06-14 DIAGNOSIS — Z952 Presence of prosthetic heart valve: Secondary | ICD-10-CM | POA: Diagnosis not present

## 2023-06-14 DIAGNOSIS — I6522 Occlusion and stenosis of left carotid artery: Secondary | ICD-10-CM | POA: Diagnosis not present

## 2023-06-14 DIAGNOSIS — I5033 Acute on chronic diastolic (congestive) heart failure: Secondary | ICD-10-CM | POA: Diagnosis not present

## 2023-06-14 NOTE — Patient Instructions (Signed)
 Visit Information  Thank you for taking time to visit with me today. Please don't hesitate to contact me if I can be of assistance to you.   Following are the goals we discussed today:  Contact provider with health questions or concerns as needed Perform exercises as recommended by therapist  Our next appointment is by telephone on 06/19/23 at 11:45 am  Please call the care guide team at [phone] if you need to cancel or reschedule your appointment.   If you are experiencing a Mental Health or Behavioral Health Crisis or need someone to talk to, please call the Suicide and Crisis Lifeline: 988 call the [place] National Suicide Prevention Lifeline: 402 315 9202 or TTY: 973-051-0658 TTY 508-806-3574) to talk to a trained counselor  [name], RN, MSN, BSN, CCM [place]  [facility], Population Health Case Manager Phone: [phone]

## 2023-06-14 NOTE — Patient Outreach (Signed)
 Care Coordination   Follow Up Visit Note   06/14/2023 Name: Jessica [name] MRN: [id number] DOB: [birth year]  Jessica [name] is a 89 y.o. year old female who sees Plotnikov, Georgina Quint, MD for primary care. I spoke with  [name] by phone today.  What matters to the patients health and wellness today?  RNCM called to follow up. Per Niece, [facility] was scheduled to see patient today for initial assessment.   Goals Addressed             This Visit's Progress    Care Coordination-assist with health management       Interventions Today    Flowsheet Row Most Recent Value  Chronic Disease   Chronic disease during today's visit Other  [falls, decreased mobility]  General Interventions   General Interventions Discussed/Reviewed General Interventions Reviewed  [facility] confirmed with Neice that patient has a home health Physical therapy start of care date. reiterated RNCM contact number and encouraged to call RNCM with care management needs as needed.]           SDOH assessments and interventions completed:  No  Care Coordination Interventions:  Yes, provided   Follow up plan: Follow up call scheduled for next week    Encounter Outcome:  Patient Visit Completed   [name], RN, MSN, BSN, CCM   [facility], Population Health Case Manager Phone: [phone]

## 2023-06-14 NOTE — Telephone Encounter (Signed)
 Copied from CRM [id number]. Topic: Clinical - Home Health Verbal Orders >> Jun 14, 2023  3:14 PM Deaijah H wrote:  Caller/Agency: Aurther Loft [facility]  Callback Number: [phone] Service Requested: Physical Therapy Frequency: 1w for 8w Any new concerns about the patient? Yes, strength/balance and gait training

## 2023-06-15 ENCOUNTER — Telehealth: Payer: Self-pay | Admitting: *Deleted

## 2023-06-15 NOTE — Progress Notes (Signed)
 Care Guide Pharmacy Note  06/15/2023 Name: Jessica [name] MRN: [id number] DOB: June 11, 1933  Referred By: Tresa Garter, MD Reason for referral: Care Coordination (Outreach to schedule referral with pharmacist )   Jessica [name] is a 89 y.o. year old female who is a primary care patient of Plotnikov, Georgina Quint, MD.  Jessica [name] was referred to the pharmacist for assistance related to: DMII  Successful contact was made with the patient to discuss pharmacy services including being ready for the pharmacist to call at least 5 minutes before the scheduled appointment time and to have medication bottles and any blood pressure readings ready for review. The patient agreed to meet with the pharmacist via telephone visit on 07/03/2023 @ 10am  Jessica [name], CMA, Care Guide [facility], [facility] Guide Direct Dial: [phone]  Fax: [phone] Website: [email or web address]

## 2023-06-18 ENCOUNTER — Ambulatory Visit: Payer: Self-pay | Admitting: Licensed Clinical Social Worker

## 2023-06-18 DIAGNOSIS — K219 Gastro-esophageal reflux disease without esophagitis: Secondary | ICD-10-CM | POA: Diagnosis not present

## 2023-06-18 DIAGNOSIS — Z7984 Long term (current) use of oral hypoglycemic drugs: Secondary | ICD-10-CM | POA: Diagnosis not present

## 2023-06-18 DIAGNOSIS — I480 Paroxysmal atrial fibrillation: Secondary | ICD-10-CM | POA: Diagnosis not present

## 2023-06-18 DIAGNOSIS — M1990 Unspecified osteoarthritis, unspecified site: Secondary | ICD-10-CM | POA: Diagnosis not present

## 2023-06-18 DIAGNOSIS — E7800 Pure hypercholesterolemia, unspecified: Secondary | ICD-10-CM | POA: Diagnosis not present

## 2023-06-18 DIAGNOSIS — Z7982 Long term (current) use of aspirin: Secondary | ICD-10-CM | POA: Diagnosis not present

## 2023-06-18 DIAGNOSIS — N183 Chronic kidney disease, stage 3 unspecified: Secondary | ICD-10-CM | POA: Diagnosis not present

## 2023-06-18 DIAGNOSIS — R410 Disorientation, unspecified: Secondary | ICD-10-CM | POA: Diagnosis not present

## 2023-06-18 DIAGNOSIS — E039 Hypothyroidism, unspecified: Secondary | ICD-10-CM | POA: Diagnosis not present

## 2023-06-18 DIAGNOSIS — I4892 Unspecified atrial flutter: Secondary | ICD-10-CM | POA: Diagnosis not present

## 2023-06-18 DIAGNOSIS — Z950 Presence of cardiac pacemaker: Secondary | ICD-10-CM | POA: Diagnosis not present

## 2023-06-18 DIAGNOSIS — E1122 Type 2 diabetes mellitus with diabetic chronic kidney disease: Secondary | ICD-10-CM | POA: Diagnosis not present

## 2023-06-18 DIAGNOSIS — I5033 Acute on chronic diastolic (congestive) heart failure: Secondary | ICD-10-CM | POA: Diagnosis not present

## 2023-06-18 DIAGNOSIS — J449 Chronic obstructive pulmonary disease, unspecified: Secondary | ICD-10-CM | POA: Diagnosis not present

## 2023-06-18 DIAGNOSIS — R42 Dizziness and giddiness: Secondary | ICD-10-CM | POA: Diagnosis not present

## 2023-06-18 DIAGNOSIS — Z9181 History of falling: Secondary | ICD-10-CM | POA: Diagnosis not present

## 2023-06-18 DIAGNOSIS — Z952 Presence of prosthetic heart valve: Secondary | ICD-10-CM | POA: Diagnosis not present

## 2023-06-18 DIAGNOSIS — I6522 Occlusion and stenosis of left carotid artery: Secondary | ICD-10-CM | POA: Diagnosis not present

## 2023-06-18 DIAGNOSIS — Z86711 Personal history of pulmonary embolism: Secondary | ICD-10-CM | POA: Diagnosis not present

## 2023-06-18 DIAGNOSIS — I130 Hypertensive heart and chronic kidney disease with heart failure and stage 1 through stage 4 chronic kidney disease, or unspecified chronic kidney disease: Secondary | ICD-10-CM | POA: Diagnosis not present

## 2023-06-18 DIAGNOSIS — Z8616 Personal history of COVID-19: Secondary | ICD-10-CM | POA: Diagnosis not present

## 2023-06-18 NOTE — Patient Instructions (Signed)
 Visit Information  Thank you for taking time to visit with me today. Please don't hesitate to contact me if I can be of assistance to you.   Following are the goals we discussed today:   Goals Addressed             This Visit's Progress    Client spoke of in home care needs. She uses wheelchair to help with ambulation       Interventions:  Spoke with client via phone today about client needs and status Client has fear of falling; she has had a history of falls. She has Life Alert necklace and has called Life Alert several times to help her get up when she has fallen in her home Client is hoping HHPT will start soon. She said Aurther Loft from [facility] did home visit with her last week and signed her up for help from [facility]. Client feels that HHPT would be very helpful to her at this time since she has had a history of falls Discussed pain issues. She spoke of knee pain . She said she had been using Voltarin Cream on her knees and thinks this it has been helpful Discussed support of her niece. Client niece, [name], visited client at home of client last Friday.  She often brings food items for client to use Discussed sleeping issues of client.   Consulted with RN [name] today about client status and needs [name] RN has also talked with client about level of care options for client Provided counseling support for client Encouraged client to call LCSW as needed at [phone] to discuss SW needs of client. Client was appreciative of call today from LCSW        LCSW has given Eulah Citizen LCSW name and phone number. LCSW has encouraged Avielle to call LCSW as needed for SW support at [phone]  Please call the care guide team at [phone] if you need to cancel or reschedule your appointment.   If you are experiencing a Mental Health or Behavioral Health Crisis or need someone to talk to, please go to [facility] [address], [place] [phone])   The patient verbalized understanding of instructions, educational materials, and care plan provided today and DECLINED offer to receive copy of patient instructions, educational materials, and care plan.   The patient has been provided with contact information for the care management team and has been advised to call with any health related questions or concerns.    [name]  MSW, LCSW [place] Licensed Clinical Social Worker Direct Dial:  [phone] Fax:  [phone] Website:  [email or web address]

## 2023-06-18 NOTE — Telephone Encounter (Signed)
 Okay. Thank you.

## 2023-06-18 NOTE — Patient Outreach (Signed)
 Care Coordination   Follow Up Visit Note   06/18/2023 Name: Jessica [name] MRN: [id number] DOB: Apr 25, 1933  Jessica [name] is a 89 y.o. year old female who sees Plotnikov, Georgina Quint, MD for primary care. I spoke with  [name] by phone today.  What matters to the patients health and wellness today?  Client spoke of in home care needs. She uses wheelchair to help with ambulation    Goals Addressed             This Visit's Progress    Client spoke of in home care needs. She uses wheelchair to help with ambulation       Interventions:  Spoke with client via phone today about client needs and status Client has fear of falling; she has had a history of falls. She has Life Alert necklace and has called Life Alert several times to help her get up when she has fallen in her home Client is hoping HHPT will start soon. She said Aurther Loft from [facility] did home visit with her last week and signed her up for help from [facility]. Client feels that HHPT would be very helpful to her at this time since she has had a history of falls Discussed pain issues. She spoke of knee pain . She said she had been using Voltarin Cream on her knees and thinks this it has been helpful Discussed support of her niece. Client niece, [name], visited client at home of client last Friday.  She often brings food items for client to use Discussed sleeping issues of client.   Consulted with RN [name] today about client status and needs [name] RN has also talked with client about level of care options for client Provided counseling support for client Encouraged client to call LCSW as needed at [phone] to discuss SW needs of client. Client was appreciative of call today from LCSW         SDOH assessments and interventions completed:  Yes  SDOH Interventions Today    Flowsheet Row Most Recent Value  SDOH Interventions   Depression Interventions/Treatment   Counseling  Physical Activity Interventions Other (Comments)  [mobility challenges,  uses wheelchair to ambulate. at risk for falls]  Stress Interventions Provide Counseling  [has fear of falling (history of falls). Has stress in managing medical needs. Some food needs]        Care Coordination Interventions:  Yes, provided   Interventions Today    Flowsheet Row Most Recent Value  Chronic Disease   Chronic disease during today's visit Other  [spoke with client about client needs]  General Interventions   General Interventions Discussed/Reviewed General Interventions Discussed, Community Resources  Education Interventions   Education Provided Provided Education  Provided Engineer, [employer]  Mental Health Interventions   Mental Health Discussed/Reviewed Coping Strategies  [has anxiety over mobility needs. at risk for falls Has some support from her niece, Sharon]  Nutrition Interventions   Nutrition Discussed/Reviewed Nutrition Discussed  Pharmacy Interventions   Pharmacy Dicussed/Reviewed Pharmacy Topics Discussed  Safety Interventions   Safety Discussed/Reviewed Fall Risk        Follow up plan: LCSW has provided client with LCSW name and phone number. LCSW has encouraged Jessica [name] to call LCSW as needed for SW support at [phone].   Encounter Outcome:  Patient Visit Completed    [name]  MSW, LCSW [place] Licensed Clinical Social Worker Direct Dial:  [phone] Fax:  [phone] Website:  [email or web address]

## 2023-06-18 NOTE — Telephone Encounter (Signed)
 Spoke with Aurther Loft PT [facility] And was able to give the verbal okay for  Service Requested: Physical Therapy Frequency: 1w for 8w Any new concerns about the patient? Yes, strength/balance and gait training

## 2023-06-19 ENCOUNTER — Ambulatory Visit: Payer: Self-pay

## 2023-06-19 NOTE — Patient Outreach (Signed)
 Care Coordination   Follow Up Visit Note   06/19/2023 Name: Jessica [name] MRN: [id number] DOB: [birth year]  Jessica [name] is a 89 y.o. year old female who sees Plotnikov, Georgina Quint, MD for primary care. I spoke with  [name] by phone today.  What matters to the patients health and wellness today? RNCM called to confirm start of care for home health PT. She reports [facility] came to see her today to work with her. She reports she has not fallen in past two weeks. She reports bruise to hand that extends to her fingers. However, she does not know how the bruise happened or when. She denies any soreness or pain, loss of sensation and states she is still able to use her hand/fingers as usual, adding, "I'm alright". She expresses she is glad that home health PT has started providing services. She is without questions at this time.  Goals Addressed             This Visit's Progress    Care Coordination-assist with health management       Interventions Today    Flowsheet Row Most Recent Value  Chronic Disease   Chronic disease during today's visit Other  [decreased mobility]  General Interventions   General Interventions Discussed/Reviewed General Interventions Reviewed  [confirmed home health ([facility]) has started services with patient.Advised patient to contact provider or seek medical attention if brusing of hand does not improve or worsening of condition.]  Doctor Visits Discussed/Reviewed Doctor Visits Reviewed, PCP, Specialist  PCP/Specialist Visits Compliance with follow-up visit  [upcoming scheduled visits reviewed]  Exercise Interventions   Exercise Discussed/Reviewed Exercise Reviewed  [encouraged to perform activities as directed by therapist]  Pharmacy Interventions   Pharmacy Dicussed/Reviewed Pharmacy Topics Reviewed  Safety Interventions   Safety Discussed/Reviewed Fall Risk, Home Safety  Home Safety Assistive Devices  [reviewed/reiterated fall  prevention strategies. encouraged patient to participate with home health therapy as recommended.]           SDOH assessments and interventions completed:  No  Care Coordination Interventions:  Yes, provided   Follow up plan: Follow up call scheduled for 07/19/23    Encounter Outcome:  Patient Visit Completed   [name], RN, MSN, BSN, CCM [place]  [facility], Population Health Case Manager Phone: [phone]

## 2023-06-19 NOTE — Patient Instructions (Signed)
 Visit Information  Thank you for taking time to visit with me today. Please don't hesitate to contact me if I can be of assistance to you.   Following are the goals we discussed today:  Continue to take medications as prescribed. Continue to attend provider visits as scheduled Continue to eat healthy, lean meats, vegetables, fruits, avoid saturated and transfats Contact provider with health questions or concerns as needed Work with home health therapist as recommended   Our next appointment is by telephone on 07/19/23 at 11:15 am  Please call the care guide team at [phone] if you need to cancel or reschedule your appointment.   If you are experiencing a Mental Health or Behavioral Health Crisis or need someone to talk to, please call the Suicide and Crisis Lifeline: 988 call the [place] National Suicide Prevention Lifeline: (820)365-8243 or TTY: (217)625-9735 TTY (409) 510-5895) to talk to a trained counselor   [name], RN, MSN, BSN, CCM [place]  [facility], Population Health Case Manager Phone: [phone]

## 2023-06-20 DIAGNOSIS — I4892 Unspecified atrial flutter: Secondary | ICD-10-CM | POA: Diagnosis not present

## 2023-06-20 DIAGNOSIS — E039 Hypothyroidism, unspecified: Secondary | ICD-10-CM | POA: Diagnosis not present

## 2023-06-20 DIAGNOSIS — Z8616 Personal history of COVID-19: Secondary | ICD-10-CM | POA: Diagnosis not present

## 2023-06-20 DIAGNOSIS — R410 Disorientation, unspecified: Secondary | ICD-10-CM | POA: Diagnosis not present

## 2023-06-20 DIAGNOSIS — I480 Paroxysmal atrial fibrillation: Secondary | ICD-10-CM | POA: Diagnosis not present

## 2023-06-20 DIAGNOSIS — Z86711 Personal history of pulmonary embolism: Secondary | ICD-10-CM | POA: Diagnosis not present

## 2023-06-20 DIAGNOSIS — I130 Hypertensive heart and chronic kidney disease with heart failure and stage 1 through stage 4 chronic kidney disease, or unspecified chronic kidney disease: Secondary | ICD-10-CM | POA: Diagnosis not present

## 2023-06-20 DIAGNOSIS — I6522 Occlusion and stenosis of left carotid artery: Secondary | ICD-10-CM | POA: Diagnosis not present

## 2023-06-20 DIAGNOSIS — Z7984 Long term (current) use of oral hypoglycemic drugs: Secondary | ICD-10-CM | POA: Diagnosis not present

## 2023-06-20 DIAGNOSIS — E7800 Pure hypercholesterolemia, unspecified: Secondary | ICD-10-CM | POA: Diagnosis not present

## 2023-06-20 DIAGNOSIS — Z952 Presence of prosthetic heart valve: Secondary | ICD-10-CM | POA: Diagnosis not present

## 2023-06-20 DIAGNOSIS — R42 Dizziness and giddiness: Secondary | ICD-10-CM | POA: Diagnosis not present

## 2023-06-20 DIAGNOSIS — M1990 Unspecified osteoarthritis, unspecified site: Secondary | ICD-10-CM | POA: Diagnosis not present

## 2023-06-20 DIAGNOSIS — I5033 Acute on chronic diastolic (congestive) heart failure: Secondary | ICD-10-CM | POA: Diagnosis not present

## 2023-06-20 DIAGNOSIS — Z950 Presence of cardiac pacemaker: Secondary | ICD-10-CM | POA: Diagnosis not present

## 2023-06-20 DIAGNOSIS — E1122 Type 2 diabetes mellitus with diabetic chronic kidney disease: Secondary | ICD-10-CM | POA: Diagnosis not present

## 2023-06-20 DIAGNOSIS — K219 Gastro-esophageal reflux disease without esophagitis: Secondary | ICD-10-CM | POA: Diagnosis not present

## 2023-06-20 DIAGNOSIS — J449 Chronic obstructive pulmonary disease, unspecified: Secondary | ICD-10-CM | POA: Diagnosis not present

## 2023-06-20 DIAGNOSIS — Z9181 History of falling: Secondary | ICD-10-CM | POA: Diagnosis not present

## 2023-06-20 DIAGNOSIS — Z7982 Long term (current) use of aspirin: Secondary | ICD-10-CM | POA: Diagnosis not present

## 2023-06-20 DIAGNOSIS — N183 Chronic kidney disease, stage 3 unspecified: Secondary | ICD-10-CM | POA: Diagnosis not present

## 2023-06-25 DIAGNOSIS — E039 Hypothyroidism, unspecified: Secondary | ICD-10-CM | POA: Diagnosis not present

## 2023-06-25 DIAGNOSIS — Z8616 Personal history of COVID-19: Secondary | ICD-10-CM | POA: Diagnosis not present

## 2023-06-25 DIAGNOSIS — Z950 Presence of cardiac pacemaker: Secondary | ICD-10-CM | POA: Diagnosis not present

## 2023-06-25 DIAGNOSIS — I4892 Unspecified atrial flutter: Secondary | ICD-10-CM | POA: Diagnosis not present

## 2023-06-25 DIAGNOSIS — Z86711 Personal history of pulmonary embolism: Secondary | ICD-10-CM | POA: Diagnosis not present

## 2023-06-25 DIAGNOSIS — I130 Hypertensive heart and chronic kidney disease with heart failure and stage 1 through stage 4 chronic kidney disease, or unspecified chronic kidney disease: Secondary | ICD-10-CM | POA: Diagnosis not present

## 2023-06-25 DIAGNOSIS — E1122 Type 2 diabetes mellitus with diabetic chronic kidney disease: Secondary | ICD-10-CM | POA: Diagnosis not present

## 2023-06-25 DIAGNOSIS — Z7984 Long term (current) use of oral hypoglycemic drugs: Secondary | ICD-10-CM | POA: Diagnosis not present

## 2023-06-25 DIAGNOSIS — Z7982 Long term (current) use of aspirin: Secondary | ICD-10-CM | POA: Diagnosis not present

## 2023-06-25 DIAGNOSIS — E7800 Pure hypercholesterolemia, unspecified: Secondary | ICD-10-CM | POA: Diagnosis not present

## 2023-06-25 DIAGNOSIS — J449 Chronic obstructive pulmonary disease, unspecified: Secondary | ICD-10-CM | POA: Diagnosis not present

## 2023-06-25 DIAGNOSIS — K219 Gastro-esophageal reflux disease without esophagitis: Secondary | ICD-10-CM | POA: Diagnosis not present

## 2023-06-25 DIAGNOSIS — Z9181 History of falling: Secondary | ICD-10-CM | POA: Diagnosis not present

## 2023-06-25 DIAGNOSIS — Z952 Presence of prosthetic heart valve: Secondary | ICD-10-CM | POA: Diagnosis not present

## 2023-06-25 DIAGNOSIS — R410 Disorientation, unspecified: Secondary | ICD-10-CM | POA: Diagnosis not present

## 2023-06-25 DIAGNOSIS — I6522 Occlusion and stenosis of left carotid artery: Secondary | ICD-10-CM | POA: Diagnosis not present

## 2023-06-25 DIAGNOSIS — R42 Dizziness and giddiness: Secondary | ICD-10-CM | POA: Diagnosis not present

## 2023-06-25 DIAGNOSIS — I5033 Acute on chronic diastolic (congestive) heart failure: Secondary | ICD-10-CM | POA: Diagnosis not present

## 2023-06-25 DIAGNOSIS — N183 Chronic kidney disease, stage 3 unspecified: Secondary | ICD-10-CM | POA: Diagnosis not present

## 2023-06-25 DIAGNOSIS — I480 Paroxysmal atrial fibrillation: Secondary | ICD-10-CM | POA: Diagnosis not present

## 2023-06-25 DIAGNOSIS — M1990 Unspecified osteoarthritis, unspecified site: Secondary | ICD-10-CM | POA: Diagnosis not present

## 2023-06-26 DIAGNOSIS — Z7984 Long term (current) use of oral hypoglycemic drugs: Secondary | ICD-10-CM | POA: Diagnosis not present

## 2023-06-26 DIAGNOSIS — E039 Hypothyroidism, unspecified: Secondary | ICD-10-CM | POA: Diagnosis not present

## 2023-06-26 DIAGNOSIS — Z950 Presence of cardiac pacemaker: Secondary | ICD-10-CM | POA: Diagnosis not present

## 2023-06-26 DIAGNOSIS — R42 Dizziness and giddiness: Secondary | ICD-10-CM | POA: Diagnosis not present

## 2023-06-26 DIAGNOSIS — I5033 Acute on chronic diastolic (congestive) heart failure: Secondary | ICD-10-CM | POA: Diagnosis not present

## 2023-06-26 DIAGNOSIS — E1122 Type 2 diabetes mellitus with diabetic chronic kidney disease: Secondary | ICD-10-CM | POA: Diagnosis not present

## 2023-06-26 DIAGNOSIS — J449 Chronic obstructive pulmonary disease, unspecified: Secondary | ICD-10-CM | POA: Diagnosis not present

## 2023-06-26 DIAGNOSIS — E7800 Pure hypercholesterolemia, unspecified: Secondary | ICD-10-CM | POA: Diagnosis not present

## 2023-06-26 DIAGNOSIS — I130 Hypertensive heart and chronic kidney disease with heart failure and stage 1 through stage 4 chronic kidney disease, or unspecified chronic kidney disease: Secondary | ICD-10-CM | POA: Diagnosis not present

## 2023-06-26 DIAGNOSIS — Z952 Presence of prosthetic heart valve: Secondary | ICD-10-CM | POA: Diagnosis not present

## 2023-06-26 DIAGNOSIS — Z8616 Personal history of COVID-19: Secondary | ICD-10-CM | POA: Diagnosis not present

## 2023-06-26 DIAGNOSIS — Z86711 Personal history of pulmonary embolism: Secondary | ICD-10-CM | POA: Diagnosis not present

## 2023-06-26 DIAGNOSIS — N183 Chronic kidney disease, stage 3 unspecified: Secondary | ICD-10-CM | POA: Diagnosis not present

## 2023-06-26 DIAGNOSIS — K219 Gastro-esophageal reflux disease without esophagitis: Secondary | ICD-10-CM | POA: Diagnosis not present

## 2023-06-26 DIAGNOSIS — Z7982 Long term (current) use of aspirin: Secondary | ICD-10-CM | POA: Diagnosis not present

## 2023-06-26 DIAGNOSIS — I480 Paroxysmal atrial fibrillation: Secondary | ICD-10-CM | POA: Diagnosis not present

## 2023-06-26 DIAGNOSIS — I6522 Occlusion and stenosis of left carotid artery: Secondary | ICD-10-CM | POA: Diagnosis not present

## 2023-06-26 DIAGNOSIS — I4892 Unspecified atrial flutter: Secondary | ICD-10-CM | POA: Diagnosis not present

## 2023-06-26 DIAGNOSIS — Z9181 History of falling: Secondary | ICD-10-CM | POA: Diagnosis not present

## 2023-06-26 DIAGNOSIS — M1990 Unspecified osteoarthritis, unspecified site: Secondary | ICD-10-CM | POA: Diagnosis not present

## 2023-06-26 DIAGNOSIS — R410 Disorientation, unspecified: Secondary | ICD-10-CM | POA: Diagnosis not present

## 2023-06-28 NOTE — Addendum Note (Signed)
 Addended by: Elease Etienne A on: 06/28/2023 12:06 PM   Modules accepted: Orders

## 2023-06-28 NOTE — Progress Notes (Signed)
 Remote pacemaker transmission.

## 2023-07-03 ENCOUNTER — Other Ambulatory Visit (INDEPENDENT_AMBULATORY_CARE_PROVIDER_SITE_OTHER): Admitting: Pharmacist

## 2023-07-03 DIAGNOSIS — Z952 Presence of prosthetic heart valve: Secondary | ICD-10-CM | POA: Diagnosis not present

## 2023-07-03 DIAGNOSIS — R410 Disorientation, unspecified: Secondary | ICD-10-CM | POA: Diagnosis not present

## 2023-07-03 DIAGNOSIS — E039 Hypothyroidism, unspecified: Secondary | ICD-10-CM | POA: Diagnosis not present

## 2023-07-03 DIAGNOSIS — I5043 Acute on chronic combined systolic (congestive) and diastolic (congestive) heart failure: Secondary | ICD-10-CM

## 2023-07-03 DIAGNOSIS — Z86711 Personal history of pulmonary embolism: Secondary | ICD-10-CM | POA: Diagnosis not present

## 2023-07-03 DIAGNOSIS — M1990 Unspecified osteoarthritis, unspecified site: Secondary | ICD-10-CM | POA: Diagnosis not present

## 2023-07-03 DIAGNOSIS — E7800 Pure hypercholesterolemia, unspecified: Secondary | ICD-10-CM | POA: Diagnosis not present

## 2023-07-03 DIAGNOSIS — I4892 Unspecified atrial flutter: Secondary | ICD-10-CM | POA: Diagnosis not present

## 2023-07-03 DIAGNOSIS — Z8616 Personal history of COVID-19: Secondary | ICD-10-CM | POA: Diagnosis not present

## 2023-07-03 DIAGNOSIS — I5033 Acute on chronic diastolic (congestive) heart failure: Secondary | ICD-10-CM | POA: Diagnosis not present

## 2023-07-03 DIAGNOSIS — Z7982 Long term (current) use of aspirin: Secondary | ICD-10-CM | POA: Diagnosis not present

## 2023-07-03 DIAGNOSIS — K219 Gastro-esophageal reflux disease without esophagitis: Secondary | ICD-10-CM | POA: Diagnosis not present

## 2023-07-03 DIAGNOSIS — N183 Chronic kidney disease, stage 3 unspecified: Secondary | ICD-10-CM | POA: Diagnosis not present

## 2023-07-03 DIAGNOSIS — E118 Type 2 diabetes mellitus with unspecified complications: Secondary | ICD-10-CM

## 2023-07-03 DIAGNOSIS — R42 Dizziness and giddiness: Secondary | ICD-10-CM | POA: Diagnosis not present

## 2023-07-03 DIAGNOSIS — I480 Paroxysmal atrial fibrillation: Secondary | ICD-10-CM | POA: Diagnosis not present

## 2023-07-03 DIAGNOSIS — J449 Chronic obstructive pulmonary disease, unspecified: Secondary | ICD-10-CM | POA: Diagnosis not present

## 2023-07-03 DIAGNOSIS — E1122 Type 2 diabetes mellitus with diabetic chronic kidney disease: Secondary | ICD-10-CM | POA: Diagnosis not present

## 2023-07-03 DIAGNOSIS — Z9181 History of falling: Secondary | ICD-10-CM | POA: Diagnosis not present

## 2023-07-03 DIAGNOSIS — I130 Hypertensive heart and chronic kidney disease with heart failure and stage 1 through stage 4 chronic kidney disease, or unspecified chronic kidney disease: Secondary | ICD-10-CM | POA: Diagnosis not present

## 2023-07-03 DIAGNOSIS — Z950 Presence of cardiac pacemaker: Secondary | ICD-10-CM | POA: Diagnosis not present

## 2023-07-03 DIAGNOSIS — Z7984 Long term (current) use of oral hypoglycemic drugs: Secondary | ICD-10-CM | POA: Diagnosis not present

## 2023-07-03 DIAGNOSIS — I6522 Occlusion and stenosis of left carotid artery: Secondary | ICD-10-CM | POA: Diagnosis not present

## 2023-07-03 MED ORDER — EMPAGLIFLOZIN 10 MG PO TABS
10.0000 mg | ORAL_TABLET | Freq: Every day | ORAL | 3 refills | Status: DC
Start: 2023-07-03 — End: 2024-01-28

## 2023-07-03 NOTE — Patient Instructions (Signed)
 It was a pleasure speaking with you today!  I have enrolled you in [employer] to have the cost of your Jardiance covered. This will be sent to [facility] for pick up.   Feel free to call with any questions or concerns!  Arbutus Leas, PharmD, BCPS, CPP Clinical Pharmacist Practitioner [facility] [phone]

## 2023-07-03 NOTE — Progress Notes (Cosign Needed Addendum)
 07/03/2023 Name: Jessica [name] MRN: [id number] DOB: 13-Mar-1934  Chief Complaint  Patient presents with   Diabetes   Medication Management   Medication Access    CASSANDR CEDERBERG is a 89 y.o. year old female who presented for a telephone visit.   They were referred to the pharmacist by their Case Management Team  for assistance in managing diabetes and medication access.    Subjective:  Care Team: Primary Care Provider: Tresa Garter, MD ; Next Scheduled Visit: 08/13/23  Medication Access/Adherence  Current Pharmacy:  [id number] [place], [place] - [address] AT [facility] [address] [place] [address] Phone: [phone] Fax: [phone]  [facility] - [place], [place] - [address] [place] [address] Phone: [phone] Fax: [phone]  [facility] - [place], [place] - [address] [place] [address] Phone: [phone] Fax: [phone]   Patient reports affordability concerns with their medications: Yes  Patient reports access/transportation concerns to their pharmacy: No  Patient reports adherence concerns with their medications:  Yes    Has been out of Jardiance, unable to afford   Diabetes:  Current medications: Metformin 500 mg BID, repaglinide 2 mg TID with meals *Has been out of Jardiance  Current glucose readings: does not check BG at home  Diet: Notes she drinks all sugar free drinks. She eats cheerios. Unable to tolerate eggs anymore. Does enjoy [place] yogurt as well.   Objective:  Lab Results  Component Value Date   HGBA1C 9.2 (H) 05/07/2023    Lab Results  Component Value Date   CREATININE 1.18 05/07/2023   BUN 21 05/07/2023   NA 134 (L) 05/07/2023   K 4.5 05/07/2023   CL 98 05/07/2023   CO2 26 05/07/2023    Lab Results  Component Value Date   CHOL 207 (H)  07/12/2020   HDL 39 (L) 07/12/2020   LDLCALC 119 (H) 07/12/2020   LDLDIRECT 155.9 09/30/2012   TRIG 277 (H) 07/12/2020   CHOLHDL 5.3 (H) 07/12/2020    Medications Reviewed Today     Reviewed by Bonita Quin, RPH (Pharmacist) on 07/03/23 at 1031  Med List Status: <None>   Medication Order Taking? Sig Documenting Provider Last Dose Status Informant  albuterol (VENTOLIN HFA) 108 (90 Base) MCG/ACT inhaler [id number]  Inhale 2 puffs into the lungs every 6 (six) hours as needed for wheezing or shortness of breath.  Patient not taking: Reported on 05/10/2023   Almon Hercules, MD  Active Nursing Home Medication Administration Guide (MAG)  allopurinol (ZYLOPRIM) 100 MG tablet [id number]  TAKE 1 TABLET BY MOUTH DAILY Plotnikov, Georgina Quint, MD  Active   aspirin EC 81 MG tablet [id number]  Take 162 mg by mouth daily. Swallow whole. [provider]  Active Self  b complex vitamins tablet [id number]  Take 1 tablet by mouth daily. [provider]  Active Nursing Home Medication Administration Guide (MAG)  cefadroxil (DURICEF) 500 MG capsule [id number]  Take 1 capsule (500 mg total) by mouth 2 (two) times daily. Raymondo Band, MD  Active   Cholecalciferol 1000 UNITS tablet [id number]  Take 1,000 Units by mouth daily. [provider]  Active Nursing Home Medication Administration Guide (MAG)  CINNAMON PO [id number]  Take 1 capsule by mouth daily. [provider]  Active   empagliflozin (JARDIANCE) 10 MG  TABS tablet [id number] No Take 1 tablet (10 mg total) by mouth daily.  Patient not taking: Reported on 07/03/2023   Alver Sorrow, NP Not Taking Active   ezetimibe (ZETIA) 10 MG tablet [id number]  TAKE 1 TABLET BY MOUTH DAILY Chilton Si, MD  Active   furosemide (LASIX) 40 MG tablet [id number]  TAKE 1 TABLET BY MOUTH TWICE  DAILY Plotnikov, Georgina Quint, MD  Active   levothyroxine (SYNTHROID) 75 MCG tablet [id number]  TAKE 1 TABLET BY MOUTH DAILY Plotnikov, Georgina Quint, MD   Active   LORazepam (ATIVAN) 0.5 MG tablet [id number]  Take 1 tablet (0.5 mg total) by mouth 2 (two) times daily as needed. for anxiety Plotnikov, Georgina Quint, MD  Active   metFORMIN (GLUCOPHAGE) 500 MG tablet [id number]  TAKE 1 TABLET BY MOUTH TWICE  DAILY WITH MEALS Plotnikov, Georgina Quint, MD  Active   metoprolol succinate (TOPROL-XL) 50 MG 24 hr tablet [id number]  TAKE 1 TABLET BY MOUTH DAILY  WITH OR IMMEDIATELY FOLLOWING A  MEAL Plotnikov, Georgina Quint, MD  Active   Multiple Vitamin (MULTIVITAMIN) tablet [id number]  Take 1 tablet by mouth daily. [provider]  Active Nursing Home Medication Administration Guide (MAG)  nystatin powder [id number]  Apply 1 Application topically daily. Apply to under breast topically every day shift for redness/moisture. [provider]  Active Nursing Home Medication Administration Guide (MAG)  potassium chloride SA (KLOR-CON M) 20 MEQ tablet [id number]  TAKE 1 TABLET BY MOUTH DAILY Plotnikov, Georgina Quint, MD  Active   repaglinide (PRANDIN) 2 MG tablet [id number]  TAKE 2 TABLETS BY MOUTH 3 TIMES  DAILY BEFORE MEALS Plotnikov, Georgina Quint, MD  Active            Med Note [name] M   Wed Jan 10, 2023 11:41 AM) Reports takes 3 tablets twice a day  rosuvastatin (CRESTOR) 10 MG tablet [id number]  TAKE 1 TABLET BY MOUTH UP TO 3  TIMES WEEKLY AS Butler Denmark, Elmarie Shiley, MD  Active   sacubitril-valsartan (ENTRESTO) 97-103 MG [id number] Yes Take 1 tablet by mouth 2 (two) times daily. Almon Hercules, MD Taking Active Nursing Home Medication Administration Guide (MAG)           Med Note Littie Deeds, CHERYL A   Thu Aug 10, 2022  9:52 AM) Has PAP  spironolactone (ALDACTONE) 25 MG tablet [id number]  Take 0.5 tablets (12.5 mg total) by mouth daily. Alver Sorrow, NP  Active   temazepam (RESTORIL) 15 MG capsule [id number]  TAKE 1 CAPSULE BY MOUTH EVERY NIGHT AT BEDTIME AS NEEDED Plotnikov, Georgina Quint, MD  Active   venlafaxine XR (EFFEXOR-XR) 75 MG 24 hr capsule [id number]  TAKE  1 CAPSULE BY MOUTH DAILY  WITH BREAKFAST Plotnikov, Georgina Quint, MD  Active   Med List Note Osborn Coho, CPhT 04/18/22 1633): [place] [phone]              Assessment/Plan:   Diabetes: - Currently uncontrolled, A1c goal <8% - Reviewed long term cardiovascular and renal outcomes of uncontrolled blood sugar - Reviewed goal A1c - Reviewed dietary modifications including increased protein/fiber - Recommend to restart Jardiance - Meets financial criteria for Jardiance patient assistance program through [employer]. Enrolled patient in [employer] grant for cardiomyopathy - pt originally prescribed Jardiance for CHF.    Follow Up Plan: 5/15  Arbutus Leas, PharmD, BCPS, CPP Clinical Pharmacist Practitioner [place] [facility] [phone]  Medical screening examination/treatment/procedure(s)  were performed by non-physician practitioner and as supervising physician I was immediately available for consultation/collaboration.  I agree with above. Jacinta Shoe, MD

## 2023-07-05 ENCOUNTER — Ambulatory Visit: Payer: Medicare Other

## 2023-07-05 VITALS — Ht 61.0 in | Wt 238.0 lb

## 2023-07-05 DIAGNOSIS — Z0000 Encounter for general adult medical examination without abnormal findings: Secondary | ICD-10-CM

## 2023-07-05 NOTE — Progress Notes (Signed)
 Subjective:   Jessica [name] is a 89 y.o. who presents for a Medicare Wellness preventive visit.  Visit Complete: Virtual I connected with  [name] on 07/05/23 by a audio enabled telemedicine application and verified that I am speaking with the correct person using two identifiers.  Patient Location: Home  Provider Location: Office/Clinic  I discussed the limitations of evaluation and management by telemedicine. The patient expressed understanding and agreed to proceed.  Vital Signs: Because this visit was a virtual/telehealth visit, some criteria may be missing or patient reported. Any vitals not documented were not able to be obtained and vitals that have been documented are patient reported.  VideoDeclined- This patient declined Librarian, academic. Therefore the visit was completed with audio only.  Persons Participating in Visit: Patient.  AWV Questionnaire: No: Patient Medicare AWV questionnaire was not completed prior to this visit.  Cardiac Risk Factors include: advanced age (>108men, >24 women);diabetes mellitus;dyslipidemia;hypertension;obesity (BMI >30kg/m2)     Objective:    Today's Vitals   07/05/23 1124  Weight: 238 lb (108 kg)  Height: 5\' 1"  (1.549 m)   Body mass index is 44.97 kg/m.     07/05/2023   11:22 AM 07/04/2022    4:53 PM 05/17/2022    2:32 PM 05/17/2022   11:00 AM 04/24/2022   10:50 AM 04/05/2022    3:00 PM 07/25/2021    8:00 AM  Advanced Directives  Does Patient Have a Medical Advance Directive? No Yes Yes No Unable to assess, patient is non-responsive or altered mental status No No  Type of Advance Directive  Living will;Healthcare Power of [employer] Power of [employer] Power of Attorney     Does patient want to make changes to medical advance directive?  No - Patient declined       Copy of Healthcare Power of Attorney in Chart?  Yes - validated most recent copy scanned in chart (See row  information) Yes - validated most recent copy scanned in chart (See row information)      Would patient like information on creating a medical advance directive? Yes (MAU/Ambulatory/Procedural Areas - Information given)    -- No - Patient declined     Current Medications (verified) Outpatient Encounter Medications as of 07/05/2023  Medication Sig   albuterol (VENTOLIN HFA) 108 (90 Base) MCG/ACT inhaler Inhale 2 puffs into the lungs every 6 (six) hours as needed for wheezing or shortness of breath.   allopurinol (ZYLOPRIM) 100 MG tablet TAKE 1 TABLET BY MOUTH DAILY   aspirin EC 81 MG tablet Take 162 mg by mouth daily. Swallow whole.   b complex vitamins tablet Take 1 tablet by mouth daily.   cefadroxil (DURICEF) 500 MG capsule Take 1 capsule (500 mg total) by mouth 2 (two) times daily.   Cholecalciferol 1000 UNITS tablet Take 1,000 Units by mouth daily.   CINNAMON PO Take 1 capsule by mouth daily.   empagliflozin (JARDIANCE) 10 MG TABS tablet Take 1 tablet (10 mg total) by mouth daily.   ezetimibe (ZETIA) 10 MG tablet TAKE 1 TABLET BY MOUTH DAILY   furosemide (LASIX) 40 MG tablet TAKE 1 TABLET BY MOUTH TWICE  DAILY   levothyroxine (SYNTHROID) 75 MCG tablet TAKE 1 TABLET BY MOUTH DAILY   LORazepam (ATIVAN) 0.5 MG tablet Take 1 tablet (0.5 mg total) by mouth 2 (two) times daily as needed. for anxiety   metFORMIN (GLUCOPHAGE) 500 MG tablet TAKE 1 TABLET BY MOUTH TWICE  DAILY WITH  MEALS   metoprolol succinate (TOPROL-XL) 50 MG 24 hr tablet TAKE 1 TABLET BY MOUTH DAILY  WITH OR IMMEDIATELY FOLLOWING A  MEAL   Multiple Vitamin (MULTIVITAMIN) tablet Take 1 tablet by mouth daily.   nystatin powder Apply 1 Application topically daily. Apply to under breast topically every day shift for redness/moisture.   potassium chloride SA (KLOR-CON M) 20 MEQ tablet TAKE 1 TABLET BY MOUTH DAILY   repaglinide (PRANDIN) 2 MG tablet TAKE 2 TABLETS BY MOUTH 3 TIMES  DAILY BEFORE MEALS   rosuvastatin (CRESTOR) 10 MG  tablet TAKE 1 TABLET BY MOUTH UP TO 3  TIMES WEEKLY AS TOLERATED   sacubitril-valsartan (ENTRESTO) 97-103 MG Take 1 tablet by mouth 2 (two) times daily.   spironolactone (ALDACTONE) 25 MG tablet Take 0.5 tablets (12.5 mg total) by mouth daily.   temazepam (RESTORIL) 15 MG capsule TAKE 1 CAPSULE BY MOUTH EVERY NIGHT AT BEDTIME AS NEEDED   venlafaxine XR (EFFEXOR-XR) 75 MG 24 hr capsule TAKE 1 CAPSULE BY MOUTH DAILY  WITH BREAKFAST   No facility-administered encounter medications on file as of 07/05/2023.    Allergies (verified) Coreg [carvedilol], Relafen [nabumetone], Calcium, Codeine, Lipitor [atorvastatin], Pacerone [amiodarone], Vasotec [enalapril], Vioxx [rofecoxib], and Zocor [simvastatin]   History: Past Medical History:  Diagnosis Date   Anxiety    Atrial fibrillation and flutter (HCC)    detected by PPM interrogation (mostly atrial flutter)   AV block, 2nd degree 10/2012   s/p MDT ADDRL1 pacemaker implantation 10/10/2012 by Dr Johney Frame   Chronic diastolic heart failure (HCC)    Depression    GERD (gastroesophageal reflux disease)    HH (hiatus hernia)    History of pulmonary embolism 10/2008   Bilateral   Hyperlipidemia    Hypertension    Hypothyroidism    Obesity    Osteoarthritis    Peripheral neuropathy    Right leg   Presence of permanent cardiac pacemaker    Pulmonary nodule    in the lingula. Noted on pre TAVR CT. Will require follow up   Renal insufficiency 2010   S/P TAVR (transcatheter aortic valve replacement)    Shingles 09/17/2014   right lower quadrant   Type II or unspecified type diabetes mellitus without mention of complication, not stated as uncontrolled 2009   Past Surgical History:  Procedure Laterality Date   APPENDECTOMY     AV NODE ABLATION N/A 07/05/2017   Procedure: AV NODE ABLATION;  Surgeon: Hillis Range, MD;  Location: [facility] INVASIVE CV LAB;  Service: Cardiovascular;  Laterality: N/A;   CARPAL TUNNEL RELEASE     CATARACT EXTRACTION      CHOLECYSTECTOMY     COLONOSCOPY  2010   COLONOSCOPY WITH PROPOFOL N/A 07/25/2021   Procedure: COLONOSCOPY WITH PROPOFOL;  Surgeon: Napoleon Form, MD;  Location: [facility] ENDOSCOPY;  Service: Gastroenterology;  Laterality: N/A;   FOREARM FRACTURE SURGERY  09/17/2008   HERNIA REPAIR     INGUINAL HERNIA REPAIR     PACEMAKER INSERTION  10/10/2012   MDT ADDRL1 implanted for 2nd degree AV block by Dr Johney Frame   PERMANENT PACEMAKER INSERTION N/A 10/10/2012   Procedure: PERMANENT PACEMAKER INSERTION;  Surgeon: Hillis Range, MD;  Location: [facility] CATH LAB;  Service: Cardiovascular;  Laterality: N/A;   RIGHT/LEFT HEART CATH AND CORONARY ANGIOGRAPHY N/A 12/30/2018   Procedure: RIGHT/LEFT HEART CATH AND CORONARY ANGIOGRAPHY;  Surgeon: Kathleene Hazel, MD;  Location: [facility] INVASIVE CV LAB;  Service: Cardiovascular;  Laterality: N/A;   ROTATOR CUFF REPAIR  TEE WITHOUT CARDIOVERSION N/A 01/14/2019   Procedure: TRANSESOPHAGEAL ECHOCARDIOGRAM (TEE);  Surgeon: Kathleene Hazel, MD;  [facility] INVASIVE CV LAB;  Service: Open Heart Surgery;  Laterality: N/A;   TEE WITHOUT CARDIOVERSION N/A 04/05/2022   Procedure: TRANSESOPHAGEAL ECHOCARDIOGRAM (TEE);  Surgeon: Maisie Fus, MD;  Location: [facility] ENDOSCOPY;  Service: Cardiovascular;  Laterality: N/A;   TRANSCATHETER AORTIC VALVE REPLACEMENT, TRANSFEMORAL N/A 01/14/2019   Procedure: TRANSCATHETER AORTIC VALVE REPLACEMENT, TRANSFEMORAL;  Surgeon: Kathleene Hazel, MD;  Location: [facility] INVASIVE CV LAB;  Service: Open Heart Surgery;  Laterality: N/A;   Family History  Problem Relation Age of Onset   Stroke Brother 85   Coronary artery disease Mother    Heart disease Mother 57   Stroke Father 54   Multiple myeloma Brother    Heart attack Son 29       Died of MI   Heart attack Daughter 8       Died of MI   Aneurysm Daughter 35       Question cerebral   Heart disease Maternal Grandmother    Heart disease Maternal Grandfather    Peripheral  vascular disease Daughter        Amputation secondary to DM   Social History   Socioeconomic History   Marital status: Widowed    Spouse name: Not on file   Number of children: 3   Years of education: Not on file   Highest education level: Not on file  Occupational History   Not on file  Tobacco Use   Smoking status: Never    Passive exposure: Never   Smokeless tobacco: Never  Vaping Use   Vaping status: Never Used  Substance and Sexual Activity   Alcohol use: No    Alcohol/week: 0.0 standard drinks of alcohol   Drug use: No   Sexual activity: Not Currently  Other Topics Concern   Not on file  Social History Narrative   Opth - Dr Pearlean Brownie   Retired, Looking after great-grand baby; lives w/son   Daily Caffeine Use - 1   Widow - suicide 2008-09-03; 2 daughters died      lost brother and son 07-16-12; spouse died 07/16/08   Have 4 children; 3 have expired,  now has one; dtr; copes by reading    Father had stroke at 62 and mother had heart disease; MI at 78  but lived to be 54.    Son died quickly of lung cancer   [name]; the oldest had aneurysm   Youngest dtr 50 and died of MI   Twin sister dx with Alzheimer's today/    Will give information regarding Alz resources   Social Drivers of Health   Financial Resource Strain: Low Risk  (07/05/2023)   Overall Financial Resource Strain (CARDIA)    Difficulty of Paying Living Expenses: Not hard at all  Food Insecurity: No Food Insecurity (07/05/2023)   Hunger Vital Sign    Worried About Running Out of Food in the Last Year: Never true    Ran Out of Food in the Last Year: Never true  Transportation Needs: No Transportation Needs (07/05/2023)   PRAPARE - Administrator, Civil Service (Medical): No    Lack of Transportation (Non-Medical): No  Physical Activity: Insufficiently Active (07/05/2023)   Exercise Vital Sign    Days of Exercise per Week: 1 day    Minutes of Exercise per Session: 20 min  Stress: Stress Concern Present  (07/05/2023)   [place]  [employer] - Occupational Stress Questionnaire    Feeling of Stress : To some extent  Social Connections: Socially Isolated (07/05/2023)   Social Connection and Isolation Panel [NHANES]    Frequency of Communication with Friends and Family: More than three times a week    Frequency of Social Gatherings with Friends and Family: Once a week    Attends Religious Services: Never    Database administrator or Organizations: No    Attends Banker Meetings: Never    Marital Status: Widowed    Tobacco Counseling Counseling given: Not Answered    Clinical Intake:  Pre-visit preparation completed: Yes  Pain : No/denies pain     BMI - recorded: 44.97 Nutritional Risks: None Diabetes: Yes CBG done?: No Did pt. bring in CBG monitor from home?: No  Lab Results  Component Value Date   HGBA1C 9.2 (H) 05/07/2023   HGBA1C 8.1 (H) 08/14/2022   HGBA1C 8.4 (H) 04/19/2022     How often do you need to have someone help you when you read instructions, pamphlets, or other written materials from your doctor or pharmacy?: 1 - Never  Interpreter Needed?: No  Information entered by :: Meda Dudzinski   Activities of Daily Living     07/05/2023   11:29 AM  In your present state of health, do you have any difficulty performing the following activities:  Hearing? 0  Vision? 0  Difficulty concentrating or making decisions? 0  Walking or climbing stairs? 0  Dressing or bathing? 0  Doing errands, shopping? 0  Preparing Food and eating ? N  Using the Toilet? N  In the past six months, have you accidently leaked urine? Y  Comment wears depends  Do you have problems with loss of bowel control? N  Managing your Medications? N  Managing your Finances? N  Comment Neice assist  Housekeeping or managing your Housekeeping? N    Patient Care Team: Plotnikov, Georgina Quint, MD as PCP - General (Internal Medicine) Chilton Si, MD as PCP -  Cardiology (Cardiology) Lanier Prude, MD as PCP - Electrophysiology (Cardiology) Chilton Si, MD as Attending Physician (Cardiology) [facility] Colletta Maryland, RN as [facility] Care Management Vu, Tonita Phoenix, MD as Consulting Physician (Infectious Diseases) Colletta Maryland, RN as [facility] Care Management Forrest, Kelton Pillar, LCSW as [facility] Care Management (Licensed Clinical Social Worker)  Indicate any recent Medical Services you may have received from other than [facility] providers in the past year (date may be approximate).     Assessment:   This is a routine wellness examination for Jessica [name].  Hearing/Vision screen Hearing Screening - Comments:: Denies hearing difficulties   Vision Screening - Comments:: Wears rx glasses - pt stated has an appt in 08/2023 w/ a new Ophthalmologist.   Goals Addressed               This Visit's Progress     Patient Stated (pt-stated)        Patient stated she wants be able to use her legs more considering has had many falls in the past.  Therapist does help.       Depression Screen     07/05/2023   11:37 AM 06/18/2023    3:52 PM 05/21/2023    1:25 PM 05/01/2023    9:18 AM 04/17/2023   11:33 AM 04/16/2023   12:33 PM 04/02/2023    1:47 PM  PHQ 2/9 Scores  PHQ - 2 Score 0 2 2 2 2 2 2   PHQ- 9 Score 4 8 8 7 7 7 7     Fall Risk     07/05/2023   11:31 AM 05/07/2023    2:56 PM 02/26/2023    1:19 PM 01/12/2023   11:29 AM 09/28/2022    2:54 PM  Fall Risk   Falls in the past year? 1 1 0 1 1  Number falls in past yr: 1 1 0 1 1  Comment 6 falls      Injury with Fall? 0 1 0 0 0  Risk for fall due to : History of fall(s);Impaired balance/gait History of fall(s);Impaired balance/gait No Fall Risks No Fall Risks History of fall(s);Impaired balance/gait;Impaired mobility  Follow up Falls evaluation completed;Falls prevention discussed Falls evaluation completed Falls evaluation  completed Falls evaluation completed Falls evaluation completed    MEDICARE RISK AT HOME:  Medicare Risk at Home Any stairs in or around the home?: Yes If so, are there any without handrails?: No Home free of loose throw rugs in walkways, pet beds, electrical cords, etc?: Yes Adequate lighting in your home to reduce risk of falls?: Yes Life alert?: Yes (wears around neck) Use of a cane, walker or w/c?: Yes (cane/walker) Grab bars in the bathroom?: Yes Shower chair or bench in shower?: Yes Elevated toilet seat or a handicapped toilet?: Yes  TIMED UP AND GO:  Was the test performed?  No  Cognitive Function: 6CIT completed    12/21/2017    1:28 PM 12/20/2016    5:23 PM 10/27/2015   11:35 AM 10/19/2014    1:51 PM  MMSE - Mini Mental State Exam  Not completed:   -- Unable to complete  Orientation to time 5 5    Orientation to Place 5 5    Registration 3 3    Attention/ Calculation 5 5    Recall 1 2    Language- name 2 objects 2 2    Language- repeat 1 1    Language- follow 3 step command 3 3    Language- read & follow direction 1 1    Write a sentence 1 1    Copy design 1 1    Total score 28 29          07/05/2023   11:33 AM 07/04/2022    4:54 PM  6CIT Screen  What Year? 0 points 0 points  What month? 0 points 0 points  What time? 0 points 0 points  Count back from 20 0 points 0 points  Months in reverse 0 points 0 points  Repeat phrase 2 points 2 points  Total Score 2 points 2 points    Immunizations Immunization History  Administered Date(s) Administered   Fluad Quad(high Dose 65+) 12/04/2018, 03/10/2020, 12/21/2020, 12/26/2021   Influenza Split 12/29/2010, 12/28/2011   Influenza Whole 02/05/2007, 01/20/2008, 02/01/2009, 12/07/2009   Influenza, High Dose Seasonal PF 02/11/2013, 12/16/2015, 12/20/2016, 12/21/2017   Influenza,inj,Quad PF,6+ Mos 03/06/2014, 12/24/2014   PFIZER(Purple Top)SARS-COV-2 Vaccination 06/01/2019, 07/01/2019   PNEUMOCOCCAL CONJUGATE-20  09/07/2020   Pneumococcal Conjugate-13 06/02/2009   Pneumococcal Polysaccharide-23 04/04/2004, 06/02/2009   Td 04/04/2004, 10/14/2014    Screening Tests Health Maintenance  Topic Date Due   OPHTHALMOLOGY EXAM  Never done   FOOT EXAM  12/22/2018   COVID-19 Vaccine (3 - Pfizer risk series) 07/29/2019   INFLUENZA VACCINE  11/02/2023   Medicare Annual Wellness (AWV)  07/04/2024   DTaP/Tdap/Td (3 - Tdap) 10/13/2024  Pneumonia Vaccine 18+ Years old  Completed   DEXA SCAN  Completed   HPV VACCINES  Aged Out   Zoster Vaccines- Shingrix  Discontinued    Health Maintenance  Health Maintenance Due  Topic Date Due   OPHTHALMOLOGY EXAM  Never done   FOOT EXAM  12/22/2018   COVID-19 Vaccine (3 - Pfizer risk series) 07/29/2019   Health Maintenance Items Addressed: 07/05/2023   Additional Screening:  Vision Screening: Recommended annual ophthalmology exams for early detection of glaucoma and other disorders of the eye.  Pt stated that she has an appt with a new Ophthalmologist in 08/2023 (unable to recall name).  Dental Screening: Recommended annual dental exams for proper oral hygiene  Community Resource Referral / Chronic Care Management: CRR required this visit?  No   CCM required this visit?  No     Plan:     I have personally reviewed and noted the following in the patient's chart:   Medical and social history Use of alcohol, tobacco or illicit drugs  Current medications and supplements including opioid prescriptions. Patient is not currently taking opioid prescriptions. Functional ability and status Nutritional status Physical activity Advanced directives List of other physicians Hospitalizations, surgeries, and ER visits in previous 12 months Vitals Screenings to include cognitive, depression, and falls Referrals and appointments  In addition, I have reviewed and discussed with patient certain preventive protocols, quality metrics, and best practice  recommendations. A written personalized care plan for preventive services as well as general preventive health recommendations were provided to patient.     Darreld Mclean, CMA   07/05/2023   After Visit Summary: (Declined) Due to this being a telephonic visit, with patients personalized plan was offered to patient but patient Declined AVS at this time   Notes: Nothing significant to report at this time.

## 2023-07-05 NOTE — Patient Instructions (Addendum)
 Jessica [name] , Thank you for taking time to come for your Medicare Wellness Visit. I appreciate your ongoing commitment to your health goals. Please review the following plan we discussed and let me know if I can assist you in the future.   Referrals/Orders/Follow-Ups/Clinician Recommendations: Aim for 30 minutes of exercise or brisk walking, 6-8 glasses of water, and 5 servings of fruits and vegetables each day.   This is a list of the screening recommended for you and due dates:  Health Maintenance  Topic Date Due   Eye exam for diabetics  Never done   Complete foot exam   12/22/2018   COVID-19 Vaccine (3 - Pfizer risk series) 07/29/2019   Flu Shot  11/02/2023   Medicare Annual Wellness Visit  07/04/2024   DTaP/Tdap/Td vaccine (3 - Tdap) 10/13/2024   Pneumonia Vaccine  Completed   DEXA scan (bone density measurement)  Completed   HPV Vaccine  Aged Out   Zoster (Shingles) Vaccine  Discontinued    Advanced directives: (Provided) Advance directive discussed with you today. I have provided a copy for you to complete at home and have notarized. Once this is complete, please bring a copy in to our office so we can scan it into your chart.   Next Medicare Annual Wellness Visit scheduled for next year: Yes

## 2023-07-06 DIAGNOSIS — E039 Hypothyroidism, unspecified: Secondary | ICD-10-CM | POA: Diagnosis not present

## 2023-07-06 DIAGNOSIS — I480 Paroxysmal atrial fibrillation: Secondary | ICD-10-CM | POA: Diagnosis not present

## 2023-07-06 DIAGNOSIS — N183 Chronic kidney disease, stage 3 unspecified: Secondary | ICD-10-CM | POA: Diagnosis not present

## 2023-07-06 DIAGNOSIS — E7800 Pure hypercholesterolemia, unspecified: Secondary | ICD-10-CM | POA: Diagnosis not present

## 2023-07-06 DIAGNOSIS — I6522 Occlusion and stenosis of left carotid artery: Secondary | ICD-10-CM | POA: Diagnosis not present

## 2023-07-06 DIAGNOSIS — Z7982 Long term (current) use of aspirin: Secondary | ICD-10-CM | POA: Diagnosis not present

## 2023-07-06 DIAGNOSIS — Z7984 Long term (current) use of oral hypoglycemic drugs: Secondary | ICD-10-CM | POA: Diagnosis not present

## 2023-07-06 DIAGNOSIS — R42 Dizziness and giddiness: Secondary | ICD-10-CM | POA: Diagnosis not present

## 2023-07-06 DIAGNOSIS — J449 Chronic obstructive pulmonary disease, unspecified: Secondary | ICD-10-CM | POA: Diagnosis not present

## 2023-07-06 DIAGNOSIS — M1990 Unspecified osteoarthritis, unspecified site: Secondary | ICD-10-CM | POA: Diagnosis not present

## 2023-07-06 DIAGNOSIS — I5033 Acute on chronic diastolic (congestive) heart failure: Secondary | ICD-10-CM | POA: Diagnosis not present

## 2023-07-06 DIAGNOSIS — E1122 Type 2 diabetes mellitus with diabetic chronic kidney disease: Secondary | ICD-10-CM | POA: Diagnosis not present

## 2023-07-06 DIAGNOSIS — I4892 Unspecified atrial flutter: Secondary | ICD-10-CM | POA: Diagnosis not present

## 2023-07-06 DIAGNOSIS — Z86711 Personal history of pulmonary embolism: Secondary | ICD-10-CM | POA: Diagnosis not present

## 2023-07-06 DIAGNOSIS — Z8616 Personal history of COVID-19: Secondary | ICD-10-CM | POA: Diagnosis not present

## 2023-07-06 DIAGNOSIS — Z9181 History of falling: Secondary | ICD-10-CM | POA: Diagnosis not present

## 2023-07-06 DIAGNOSIS — Z952 Presence of prosthetic heart valve: Secondary | ICD-10-CM | POA: Diagnosis not present

## 2023-07-06 DIAGNOSIS — R410 Disorientation, unspecified: Secondary | ICD-10-CM | POA: Diagnosis not present

## 2023-07-06 DIAGNOSIS — Z950 Presence of cardiac pacemaker: Secondary | ICD-10-CM | POA: Diagnosis not present

## 2023-07-06 DIAGNOSIS — I130 Hypertensive heart and chronic kidney disease with heart failure and stage 1 through stage 4 chronic kidney disease, or unspecified chronic kidney disease: Secondary | ICD-10-CM | POA: Diagnosis not present

## 2023-07-06 DIAGNOSIS — K219 Gastro-esophageal reflux disease without esophagitis: Secondary | ICD-10-CM | POA: Diagnosis not present

## 2023-07-10 DIAGNOSIS — J449 Chronic obstructive pulmonary disease, unspecified: Secondary | ICD-10-CM | POA: Diagnosis not present

## 2023-07-10 DIAGNOSIS — Z7982 Long term (current) use of aspirin: Secondary | ICD-10-CM | POA: Diagnosis not present

## 2023-07-10 DIAGNOSIS — Z8616 Personal history of COVID-19: Secondary | ICD-10-CM | POA: Diagnosis not present

## 2023-07-10 DIAGNOSIS — R42 Dizziness and giddiness: Secondary | ICD-10-CM | POA: Diagnosis not present

## 2023-07-10 DIAGNOSIS — I4892 Unspecified atrial flutter: Secondary | ICD-10-CM | POA: Diagnosis not present

## 2023-07-10 DIAGNOSIS — E039 Hypothyroidism, unspecified: Secondary | ICD-10-CM | POA: Diagnosis not present

## 2023-07-10 DIAGNOSIS — I5033 Acute on chronic diastolic (congestive) heart failure: Secondary | ICD-10-CM | POA: Diagnosis not present

## 2023-07-10 DIAGNOSIS — Z9181 History of falling: Secondary | ICD-10-CM | POA: Diagnosis not present

## 2023-07-10 DIAGNOSIS — Z7984 Long term (current) use of oral hypoglycemic drugs: Secondary | ICD-10-CM | POA: Diagnosis not present

## 2023-07-10 DIAGNOSIS — E7800 Pure hypercholesterolemia, unspecified: Secondary | ICD-10-CM | POA: Diagnosis not present

## 2023-07-10 DIAGNOSIS — K219 Gastro-esophageal reflux disease without esophagitis: Secondary | ICD-10-CM | POA: Diagnosis not present

## 2023-07-10 DIAGNOSIS — Z952 Presence of prosthetic heart valve: Secondary | ICD-10-CM | POA: Diagnosis not present

## 2023-07-10 DIAGNOSIS — N183 Chronic kidney disease, stage 3 unspecified: Secondary | ICD-10-CM | POA: Diagnosis not present

## 2023-07-10 DIAGNOSIS — R410 Disorientation, unspecified: Secondary | ICD-10-CM | POA: Diagnosis not present

## 2023-07-10 DIAGNOSIS — I480 Paroxysmal atrial fibrillation: Secondary | ICD-10-CM | POA: Diagnosis not present

## 2023-07-10 DIAGNOSIS — M1990 Unspecified osteoarthritis, unspecified site: Secondary | ICD-10-CM | POA: Diagnosis not present

## 2023-07-10 DIAGNOSIS — I130 Hypertensive heart and chronic kidney disease with heart failure and stage 1 through stage 4 chronic kidney disease, or unspecified chronic kidney disease: Secondary | ICD-10-CM | POA: Diagnosis not present

## 2023-07-10 DIAGNOSIS — E1122 Type 2 diabetes mellitus with diabetic chronic kidney disease: Secondary | ICD-10-CM | POA: Diagnosis not present

## 2023-07-10 DIAGNOSIS — Z950 Presence of cardiac pacemaker: Secondary | ICD-10-CM | POA: Diagnosis not present

## 2023-07-10 DIAGNOSIS — Z86711 Personal history of pulmonary embolism: Secondary | ICD-10-CM | POA: Diagnosis not present

## 2023-07-10 DIAGNOSIS — I6522 Occlusion and stenosis of left carotid artery: Secondary | ICD-10-CM | POA: Diagnosis not present

## 2023-07-11 ENCOUNTER — Telehealth: Payer: Self-pay | Admitting: Internal Medicine

## 2023-07-11 NOTE — Telephone Encounter (Signed)
 Copied from CRM [id number]. Topic: General - Other >> Jul 10, 2023  4:41 PM [place] J wrote: Reason for CRM: Lyla Son calling in from [facility] wanting to let Dr. Posey Rea know that patient had a fall this Sunday due to wheelchair not being properly locked in place.

## 2023-07-12 ENCOUNTER — Ambulatory Visit: Payer: Medicare Other | Admitting: Internal Medicine

## 2023-07-13 DIAGNOSIS — Z950 Presence of cardiac pacemaker: Secondary | ICD-10-CM | POA: Diagnosis not present

## 2023-07-13 DIAGNOSIS — I6522 Occlusion and stenosis of left carotid artery: Secondary | ICD-10-CM | POA: Diagnosis not present

## 2023-07-13 DIAGNOSIS — Z952 Presence of prosthetic heart valve: Secondary | ICD-10-CM | POA: Diagnosis not present

## 2023-07-13 DIAGNOSIS — Z9181 History of falling: Secondary | ICD-10-CM | POA: Diagnosis not present

## 2023-07-13 DIAGNOSIS — E7800 Pure hypercholesterolemia, unspecified: Secondary | ICD-10-CM | POA: Diagnosis not present

## 2023-07-13 DIAGNOSIS — R410 Disorientation, unspecified: Secondary | ICD-10-CM | POA: Diagnosis not present

## 2023-07-13 DIAGNOSIS — Z86711 Personal history of pulmonary embolism: Secondary | ICD-10-CM | POA: Diagnosis not present

## 2023-07-13 DIAGNOSIS — I5033 Acute on chronic diastolic (congestive) heart failure: Secondary | ICD-10-CM | POA: Diagnosis not present

## 2023-07-13 DIAGNOSIS — I130 Hypertensive heart and chronic kidney disease with heart failure and stage 1 through stage 4 chronic kidney disease, or unspecified chronic kidney disease: Secondary | ICD-10-CM | POA: Diagnosis not present

## 2023-07-13 DIAGNOSIS — Z7982 Long term (current) use of aspirin: Secondary | ICD-10-CM | POA: Diagnosis not present

## 2023-07-13 DIAGNOSIS — N183 Chronic kidney disease, stage 3 unspecified: Secondary | ICD-10-CM | POA: Diagnosis not present

## 2023-07-13 DIAGNOSIS — E1122 Type 2 diabetes mellitus with diabetic chronic kidney disease: Secondary | ICD-10-CM | POA: Diagnosis not present

## 2023-07-13 DIAGNOSIS — R42 Dizziness and giddiness: Secondary | ICD-10-CM | POA: Diagnosis not present

## 2023-07-13 DIAGNOSIS — K219 Gastro-esophageal reflux disease without esophagitis: Secondary | ICD-10-CM | POA: Diagnosis not present

## 2023-07-13 DIAGNOSIS — E039 Hypothyroidism, unspecified: Secondary | ICD-10-CM | POA: Diagnosis not present

## 2023-07-13 DIAGNOSIS — I4892 Unspecified atrial flutter: Secondary | ICD-10-CM | POA: Diagnosis not present

## 2023-07-13 DIAGNOSIS — Z7984 Long term (current) use of oral hypoglycemic drugs: Secondary | ICD-10-CM | POA: Diagnosis not present

## 2023-07-13 DIAGNOSIS — Z8616 Personal history of COVID-19: Secondary | ICD-10-CM | POA: Diagnosis not present

## 2023-07-13 DIAGNOSIS — J449 Chronic obstructive pulmonary disease, unspecified: Secondary | ICD-10-CM | POA: Diagnosis not present

## 2023-07-13 DIAGNOSIS — I480 Paroxysmal atrial fibrillation: Secondary | ICD-10-CM | POA: Diagnosis not present

## 2023-07-13 DIAGNOSIS — M1990 Unspecified osteoarthritis, unspecified site: Secondary | ICD-10-CM | POA: Diagnosis not present

## 2023-07-16 DIAGNOSIS — Z950 Presence of cardiac pacemaker: Secondary | ICD-10-CM | POA: Diagnosis not present

## 2023-07-16 DIAGNOSIS — Z7984 Long term (current) use of oral hypoglycemic drugs: Secondary | ICD-10-CM | POA: Diagnosis not present

## 2023-07-16 DIAGNOSIS — K219 Gastro-esophageal reflux disease without esophagitis: Secondary | ICD-10-CM | POA: Diagnosis not present

## 2023-07-16 DIAGNOSIS — R42 Dizziness and giddiness: Secondary | ICD-10-CM | POA: Diagnosis not present

## 2023-07-16 DIAGNOSIS — I4892 Unspecified atrial flutter: Secondary | ICD-10-CM | POA: Diagnosis not present

## 2023-07-16 DIAGNOSIS — E039 Hypothyroidism, unspecified: Secondary | ICD-10-CM | POA: Diagnosis not present

## 2023-07-16 DIAGNOSIS — J449 Chronic obstructive pulmonary disease, unspecified: Secondary | ICD-10-CM | POA: Diagnosis not present

## 2023-07-16 DIAGNOSIS — I480 Paroxysmal atrial fibrillation: Secondary | ICD-10-CM | POA: Diagnosis not present

## 2023-07-16 DIAGNOSIS — N183 Chronic kidney disease, stage 3 unspecified: Secondary | ICD-10-CM | POA: Diagnosis not present

## 2023-07-16 DIAGNOSIS — Z7982 Long term (current) use of aspirin: Secondary | ICD-10-CM | POA: Diagnosis not present

## 2023-07-16 DIAGNOSIS — E1122 Type 2 diabetes mellitus with diabetic chronic kidney disease: Secondary | ICD-10-CM | POA: Diagnosis not present

## 2023-07-16 DIAGNOSIS — Z9181 History of falling: Secondary | ICD-10-CM | POA: Diagnosis not present

## 2023-07-16 DIAGNOSIS — Z8616 Personal history of COVID-19: Secondary | ICD-10-CM | POA: Diagnosis not present

## 2023-07-16 DIAGNOSIS — Z86711 Personal history of pulmonary embolism: Secondary | ICD-10-CM | POA: Diagnosis not present

## 2023-07-16 DIAGNOSIS — I6522 Occlusion and stenosis of left carotid artery: Secondary | ICD-10-CM | POA: Diagnosis not present

## 2023-07-16 DIAGNOSIS — E7800 Pure hypercholesterolemia, unspecified: Secondary | ICD-10-CM | POA: Diagnosis not present

## 2023-07-16 DIAGNOSIS — I130 Hypertensive heart and chronic kidney disease with heart failure and stage 1 through stage 4 chronic kidney disease, or unspecified chronic kidney disease: Secondary | ICD-10-CM | POA: Diagnosis not present

## 2023-07-16 DIAGNOSIS — R410 Disorientation, unspecified: Secondary | ICD-10-CM | POA: Diagnosis not present

## 2023-07-16 DIAGNOSIS — I5033 Acute on chronic diastolic (congestive) heart failure: Secondary | ICD-10-CM | POA: Diagnosis not present

## 2023-07-16 DIAGNOSIS — M1990 Unspecified osteoarthritis, unspecified site: Secondary | ICD-10-CM | POA: Diagnosis not present

## 2023-07-16 DIAGNOSIS — Z952 Presence of prosthetic heart valve: Secondary | ICD-10-CM | POA: Diagnosis not present

## 2023-07-17 ENCOUNTER — Ambulatory Visit: Payer: Medicare Other | Admitting: Internal Medicine

## 2023-07-17 ENCOUNTER — Other Ambulatory Visit: Payer: Self-pay

## 2023-07-17 ENCOUNTER — Encounter: Payer: Self-pay | Admitting: Internal Medicine

## 2023-07-17 VITALS — BP 118/67 | HR 74 | Temp 97.6°F

## 2023-07-17 DIAGNOSIS — R410 Disorientation, unspecified: Secondary | ICD-10-CM | POA: Diagnosis not present

## 2023-07-17 DIAGNOSIS — J449 Chronic obstructive pulmonary disease, unspecified: Secondary | ICD-10-CM | POA: Diagnosis not present

## 2023-07-17 DIAGNOSIS — B9561 Methicillin susceptible Staphylococcus aureus infection as the cause of diseases classified elsewhere: Secondary | ICD-10-CM

## 2023-07-17 DIAGNOSIS — I5033 Acute on chronic diastolic (congestive) heart failure: Secondary | ICD-10-CM | POA: Diagnosis not present

## 2023-07-17 DIAGNOSIS — Z86711 Personal history of pulmonary embolism: Secondary | ICD-10-CM | POA: Diagnosis not present

## 2023-07-17 DIAGNOSIS — K219 Gastro-esophageal reflux disease without esophagitis: Secondary | ICD-10-CM | POA: Diagnosis not present

## 2023-07-17 DIAGNOSIS — Z7984 Long term (current) use of oral hypoglycemic drugs: Secondary | ICD-10-CM | POA: Diagnosis not present

## 2023-07-17 DIAGNOSIS — T827XXD Infection and inflammatory reaction due to other cardiac and vascular devices, implants and grafts, subsequent encounter: Secondary | ICD-10-CM

## 2023-07-17 DIAGNOSIS — Z952 Presence of prosthetic heart valve: Secondary | ICD-10-CM | POA: Diagnosis not present

## 2023-07-17 DIAGNOSIS — Z9181 History of falling: Secondary | ICD-10-CM | POA: Diagnosis not present

## 2023-07-17 DIAGNOSIS — E039 Hypothyroidism, unspecified: Secondary | ICD-10-CM | POA: Diagnosis not present

## 2023-07-17 DIAGNOSIS — I4892 Unspecified atrial flutter: Secondary | ICD-10-CM | POA: Diagnosis not present

## 2023-07-17 DIAGNOSIS — R42 Dizziness and giddiness: Secondary | ICD-10-CM | POA: Diagnosis not present

## 2023-07-17 DIAGNOSIS — I130 Hypertensive heart and chronic kidney disease with heart failure and stage 1 through stage 4 chronic kidney disease, or unspecified chronic kidney disease: Secondary | ICD-10-CM | POA: Diagnosis not present

## 2023-07-17 DIAGNOSIS — Z7982 Long term (current) use of aspirin: Secondary | ICD-10-CM | POA: Diagnosis not present

## 2023-07-17 DIAGNOSIS — E7800 Pure hypercholesterolemia, unspecified: Secondary | ICD-10-CM | POA: Diagnosis not present

## 2023-07-17 DIAGNOSIS — E1122 Type 2 diabetes mellitus with diabetic chronic kidney disease: Secondary | ICD-10-CM | POA: Diagnosis not present

## 2023-07-17 DIAGNOSIS — I480 Paroxysmal atrial fibrillation: Secondary | ICD-10-CM | POA: Diagnosis not present

## 2023-07-17 DIAGNOSIS — N183 Chronic kidney disease, stage 3 unspecified: Secondary | ICD-10-CM | POA: Diagnosis not present

## 2023-07-17 DIAGNOSIS — Z950 Presence of cardiac pacemaker: Secondary | ICD-10-CM | POA: Diagnosis not present

## 2023-07-17 DIAGNOSIS — M1990 Unspecified osteoarthritis, unspecified site: Secondary | ICD-10-CM | POA: Diagnosis not present

## 2023-07-17 DIAGNOSIS — I6522 Occlusion and stenosis of left carotid artery: Secondary | ICD-10-CM | POA: Diagnosis not present

## 2023-07-17 DIAGNOSIS — Z8616 Personal history of COVID-19: Secondary | ICD-10-CM | POA: Diagnosis not present

## 2023-07-17 NOTE — Patient Instructions (Signed)
 You are doing well from pacemaker infection  Continue with the antibiotics cefadroxil 500 mg twice a day    If you have fever, chill, short of breath, chest pain, malaise let me know  Otherwise I'll see you in 6 months for follow up

## 2023-07-17 NOTE — Progress Notes (Signed)
 Patient ID: Jessica [name], female   DOB: Dec 12, 1933, 89 y.o.   MRN: [id number]  HPI Jessica [name] is a 89 y.o. female here for hospital f/u tavr/pacer infection and mssa bacteremia   Jessica [name] is a 89 y.o. female here for hospital f/u tavr/pacer infection and mssa bacteremia  Patient is supposed to be on 6 weeks cefazolin until 2/8 and transitioned to cefadroxil indefinitely She was initially seen 03/29/22 by id Jessica [name] no vegetation on pacer but the tavr has vegetation Ct surgery evaluated and not surgical candidate She was put on rifampin together with cefazolin but couldn't tolerate rifampin due to severe nausea  She was sick with covid and had admission 04/20/22 when rifampin was stopped  05/09/22 id clinic She is here with her niece She is here for initial id clinic f/u She is still on 2 liters of oxygen. Patient is in a skilled nursing home She remains weak and she is not able to work as much as she wants with pt/ot. She is walking a few feet with FWW and help No issue with rue picc site No joint pain/back pain No diarrhea or rash Nausea and appetite better since rifampin stopped. There are days that she don't even have nausea. 2-3/10 at worse about 3 times a week  06/20/22 id clinic f/u Doing very well on cefadroxil 1000 mg (2x 500 mg capsules) bid No n/v/diarrhea/rash No f/c No focal pain Accompanied by her caregiver today She left [facility] from snf about 3 weeks ago and is home    01/12/23 id clinic f/u Here with her neighbor Weak overall but improving slowly Come in on wheel chair from parking lot No n/v/diarrhea   07/17/23 id clinic f/u Gets in here via wheel chair Home pt/ot but still very weak in the legs bed room to the [place] last week on her left shoulder and needed the fire department Lives alone -- one surviving kid had left AKA  No concern today -- as good as she can be Appetite is good No fever, chill Takes cefadrolil twice a day; no  n/v/diarrhea         Outpatient Encounter Medications as of 07/17/2023  Medication Sig   albuterol (VENTOLIN HFA) 108 (90 Base) MCG/ACT inhaler Inhale 2 puffs into the lungs every 6 (six) hours as needed for wheezing or shortness of breath.   allopurinol (ZYLOPRIM) 100 MG tablet TAKE 1 TABLET BY MOUTH DAILY   aspirin EC 81 MG tablet Take 162 mg by mouth daily. Swallow whole.   b complex vitamins tablet Take 1 tablet by mouth daily.   cefadroxil (DURICEF) 500 MG capsule Take 1 capsule (500 mg total) by mouth 2 (two) times daily.   Cholecalciferol 1000 UNITS tablet Take 1,000 Units by mouth daily.   CINNAMON PO Take 1 capsule by mouth daily.   empagliflozin (JARDIANCE) 10 MG TABS tablet Take 1 tablet (10 mg total) by mouth daily.   ezetimibe (ZETIA) 10 MG tablet TAKE 1 TABLET BY MOUTH DAILY   furosemide (LASIX) 40 MG tablet TAKE 1 TABLET BY MOUTH TWICE  DAILY   levothyroxine (SYNTHROID) 75 MCG tablet TAKE 1 TABLET BY MOUTH DAILY   LORazepam (ATIVAN) 0.5 MG tablet Take 1 tablet (0.5 mg total) by mouth 2 (two) times daily as needed. for anxiety   metFORMIN (GLUCOPHAGE) 500 MG tablet TAKE 1 TABLET BY MOUTH TWICE  DAILY WITH MEALS   metoprolol succinate (TOPROL-XL) 50 MG 24 hr tablet TAKE 1 TABLET BY  MOUTH DAILY  WITH OR IMMEDIATELY FOLLOWING A  MEAL   Multiple Vitamin (MULTIVITAMIN) tablet Take 1 tablet by mouth daily.   nystatin powder Apply 1 Application topically daily. Apply to under breast topically every day shift for redness/moisture.   potassium chloride SA (KLOR-CON M) 20 MEQ tablet TAKE 1 TABLET BY MOUTH DAILY   repaglinide (PRANDIN) 2 MG tablet TAKE 2 TABLETS BY MOUTH 3 TIMES  DAILY BEFORE MEALS   rosuvastatin (CRESTOR) 10 MG tablet TAKE 1 TABLET BY MOUTH UP TO 3  TIMES WEEKLY AS TOLERATED   sacubitril-valsartan (ENTRESTO) 97-103 MG Take 1 tablet by mouth 2 (two) times daily.   spironolactone (ALDACTONE) 25 MG tablet Take 0.5 tablets (12.5 mg total) by mouth daily.   temazepam  (RESTORIL) 15 MG capsule TAKE 1 CAPSULE BY MOUTH EVERY NIGHT AT BEDTIME AS NEEDED   venlafaxine XR (EFFEXOR-XR) 75 MG 24 hr capsule TAKE 1 CAPSULE BY MOUTH DAILY  WITH BREAKFAST   No facility-administered encounter medications on file as of 07/17/2023.     Patient Active Problem List   Diagnosis Date Noted   UTI (urinary tract infection) 11/20/2022   Hair loss 08/14/2022   Hypokalemia 05/15/2022   Acute on chronic diastolic CHF (congestive heart failure) (HCC) 05/14/2022   ABLA (acute blood loss anemia) 04/20/2022   Elevated INR 04/20/2022   Prolonged QT interval 04/20/2022   Delirium 04/19/2022   History of bacteremia 04/18/2022   COVID-19 virus infection 04/18/2022   COPD with acute exacerbation (HCC) 04/18/2022   Dementia with behavioral disturbance (HCC) 04/18/2022   MSSA bacteremia 04/04/2022   Prosthetic valve endocarditis (HCC) 04/04/2022   Pacemaker infection (HCC) 04/04/2022   Sepsis (HCC) 03/28/2022   Altered mental status 03/28/2022   Hypovolemia 03/28/2022   CRF (chronic renal failure), stage 3 (moderate) (HCC) 03/28/2022   Severe sepsis with acute organ dysfunction (HCC) 03/28/2022   DNR (do not resuscitate) 03/28/2022   Hypotension 03/28/2022   Rectal bleeding    Pure hypercholesterolemia 01/28/2021   Fall 12/22/2020   Knee contusion 12/22/2020   Eczema 09/09/2019   Vertigo 03/05/2019   S/P TAVR (transcatheter aortic valve replacement) 01/14/2019   Presence of permanent cardiac pacemaker    Pulmonary nodule    Severe aortic stenosis    Atrial fibrillation and flutter (HCC) 07/05/2017   DOE (dyspnea on exertion) 04/18/2017   Chronic venous insufficiency 12/08/2016   Erythema nodosum 11/10/2016   Paronychia of finger, left 04/13/2016   Left carotid artery stenosis 09/24/2014   Diarrhea 07/12/2013   Foot pain, left 03/18/2013   Gastrocnemius strain 03/18/2013   GERD (gastroesophageal reflux disease) 08/18/2011   History of pulmonary embolism 10/2008    DUODENITIS, WITHOUT HEMORRHAGE 06/29/2008   Diverticulosis of colon without hemorrhage 06/29/2008   DM type 2, controlled, with complication (HCC) 01/20/2008   Low back pain 10/15/2007   OBESITY, MORBID 07/16/2007   Grieving 05/15/2007   HLD (hyperlipidemia) 03/03/2007   Hypothyroidism 11/06/2006   Anxiety disorder 11/06/2006   CARPAL TUNNEL SYNDROME 11/06/2006   Essential hypertension 11/06/2006   HIATAL HERNIA 11/06/2006   Osteoarthritis 11/06/2006   History of endocarditis 11/06/2006     Health Maintenance Due  Topic Date Due   OPHTHALMOLOGY EXAM  Never done   FOOT EXAM  12/22/2018   COVID-19 Vaccine (3 - Pfizer risk series) 07/29/2019     Review of Systems All other ros negative  Physical Exam   BP 118/67   Pulse 74   Temp 97.6 F (36.4 C) (Oral)  SpO2 95%   Physical Exam  General/constitutional: no distress, pleasant HEENT: Normocephalic, PER, Conj Clear, EOMI, Oropharynx clear Neck supple CV: rrr no mrg - left chest pacer pocket site no tenderness Lungs: clear to auscultation, normal respiratory effort Abd: Soft, Nontender Ext: no edema Skin: No Rash Neuro: nonfocal MSK: no peripheral joint swelling/tenderness/warmth; back spines nontender     CBC Lab Results  Component Value Date   WBC 10.4 02/26/2023   RBC 4.46 02/26/2023   HGB 13.8 02/26/2023   HCT 41.7 02/26/2023   PLT 184.0 02/26/2023   MCV 93.5 02/26/2023   MCH 31.1 01/15/2023   MCHC 33.1 02/26/2023   RDW 13.8 02/26/2023   LYMPHSABS 2.9 02/26/2023   MONOABS 1.7 (H) 02/26/2023   EOSABS 0.1 02/26/2023    BMET Lab Results  Component Value Date   NA 134 (L) 05/07/2023   K 4.5 05/07/2023   CL 98 05/07/2023   CO2 26 05/07/2023   GLUCOSE 117 (H) 05/07/2023   BUN 21 05/07/2023   CREATININE 1.18 05/07/2023   CALCIUM 9.6 05/07/2023   GFRNONAA 48 (L) 05/18/2022   GFRAA 63 12/16/2019   No results found for: "CRP"   Assessment and Plan  Abx: 05/09/22-c cefadroxil  03/29/22-2/6  Cefazolin 04/06/22-04/20/22 rifampin                                                   Assessment: Mssa bsi, tavr/pacer infection    Patient recent admission for mssa process, d/c'ed to snf on opat for 6 weeks rifampin/cefaz and continued on indefinite cefadroxil, admitted 1/16 for ams/weakness, diarrhea, worsening nausea found to have covid infection   Recent admission w/u --> MSSA prosthetic valve endocarditis involving TAVR and presumed PPM involvement (though no observed vegetation on TEE).    In reviewing her nausea it seems she has had that since last admission and given aki on presentation, concern this is rather more severe than average. I suspect the rifampin is inducing this. This was started in setting her cardiac device unable to be extracted. Given unclear benefit and limiting side effect will hold rifampin for now. Will finish cefazolin on 05/11/22 and continue suppressive abx with cefadroxil 1 gram bid indefinitely     Now that she is off rifampin, her previous DOAC can be resumed    Covid management per primary team   --------- 05/09/22 Doing well Nausea basically resolved and appetite better since rifampin stopped 04/20/22 Recovering from covid infection still weak and on 2 liters o2 No concerning changes in terms of tavr/pacer infection   Finish cefazolin on 2/8 Start cefadroxil 1 gram twice a day on 05/12/22 F/u 6 weeks   3/19 id assessment Doing well Will get labs to monitor toxicity long term abx In 3 months if things go well will decrease to 500 mg po bid Refill cefadroxil rx   ------- 07/17/23 id clinic assessment No concern today Tolerating suppressive cefadroxil Reviewed recent labs no concern for cefadroxil toxicity  Doing well from pacer infection standpoint.  Weak and needs pt/ot  Continue cefadroxil 500 mg twice a day  F/u with me in 6 months

## 2023-07-19 DIAGNOSIS — R410 Disorientation, unspecified: Secondary | ICD-10-CM | POA: Diagnosis not present

## 2023-07-19 DIAGNOSIS — J449 Chronic obstructive pulmonary disease, unspecified: Secondary | ICD-10-CM | POA: Diagnosis not present

## 2023-07-19 DIAGNOSIS — E1122 Type 2 diabetes mellitus with diabetic chronic kidney disease: Secondary | ICD-10-CM | POA: Diagnosis not present

## 2023-07-19 DIAGNOSIS — Z7982 Long term (current) use of aspirin: Secondary | ICD-10-CM | POA: Diagnosis not present

## 2023-07-19 DIAGNOSIS — I6522 Occlusion and stenosis of left carotid artery: Secondary | ICD-10-CM | POA: Diagnosis not present

## 2023-07-19 DIAGNOSIS — Z952 Presence of prosthetic heart valve: Secondary | ICD-10-CM | POA: Diagnosis not present

## 2023-07-19 DIAGNOSIS — Z950 Presence of cardiac pacemaker: Secondary | ICD-10-CM | POA: Diagnosis not present

## 2023-07-19 DIAGNOSIS — K219 Gastro-esophageal reflux disease without esophagitis: Secondary | ICD-10-CM | POA: Diagnosis not present

## 2023-07-19 DIAGNOSIS — I4892 Unspecified atrial flutter: Secondary | ICD-10-CM | POA: Diagnosis not present

## 2023-07-19 DIAGNOSIS — E7800 Pure hypercholesterolemia, unspecified: Secondary | ICD-10-CM | POA: Diagnosis not present

## 2023-07-19 DIAGNOSIS — Z9181 History of falling: Secondary | ICD-10-CM | POA: Diagnosis not present

## 2023-07-19 DIAGNOSIS — Z8616 Personal history of COVID-19: Secondary | ICD-10-CM | POA: Diagnosis not present

## 2023-07-19 DIAGNOSIS — Z7984 Long term (current) use of oral hypoglycemic drugs: Secondary | ICD-10-CM | POA: Diagnosis not present

## 2023-07-19 DIAGNOSIS — R42 Dizziness and giddiness: Secondary | ICD-10-CM | POA: Diagnosis not present

## 2023-07-19 DIAGNOSIS — E039 Hypothyroidism, unspecified: Secondary | ICD-10-CM | POA: Diagnosis not present

## 2023-07-19 DIAGNOSIS — N183 Chronic kidney disease, stage 3 unspecified: Secondary | ICD-10-CM | POA: Diagnosis not present

## 2023-07-19 DIAGNOSIS — I480 Paroxysmal atrial fibrillation: Secondary | ICD-10-CM | POA: Diagnosis not present

## 2023-07-19 DIAGNOSIS — I130 Hypertensive heart and chronic kidney disease with heart failure and stage 1 through stage 4 chronic kidney disease, or unspecified chronic kidney disease: Secondary | ICD-10-CM | POA: Diagnosis not present

## 2023-07-19 DIAGNOSIS — Z86711 Personal history of pulmonary embolism: Secondary | ICD-10-CM | POA: Diagnosis not present

## 2023-07-19 DIAGNOSIS — I5033 Acute on chronic diastolic (congestive) heart failure: Secondary | ICD-10-CM | POA: Diagnosis not present

## 2023-07-19 DIAGNOSIS — M1990 Unspecified osteoarthritis, unspecified site: Secondary | ICD-10-CM | POA: Diagnosis not present

## 2023-07-21 DIAGNOSIS — E039 Hypothyroidism, unspecified: Secondary | ICD-10-CM | POA: Diagnosis not present

## 2023-07-21 DIAGNOSIS — J449 Chronic obstructive pulmonary disease, unspecified: Secondary | ICD-10-CM | POA: Diagnosis not present

## 2023-07-21 DIAGNOSIS — Z7984 Long term (current) use of oral hypoglycemic drugs: Secondary | ICD-10-CM | POA: Diagnosis not present

## 2023-07-21 DIAGNOSIS — I5033 Acute on chronic diastolic (congestive) heart failure: Secondary | ICD-10-CM | POA: Diagnosis not present

## 2023-07-21 DIAGNOSIS — I6522 Occlusion and stenosis of left carotid artery: Secondary | ICD-10-CM | POA: Diagnosis not present

## 2023-07-21 DIAGNOSIS — Z8616 Personal history of COVID-19: Secondary | ICD-10-CM | POA: Diagnosis not present

## 2023-07-21 DIAGNOSIS — Z950 Presence of cardiac pacemaker: Secondary | ICD-10-CM | POA: Diagnosis not present

## 2023-07-21 DIAGNOSIS — R410 Disorientation, unspecified: Secondary | ICD-10-CM | POA: Diagnosis not present

## 2023-07-21 DIAGNOSIS — I480 Paroxysmal atrial fibrillation: Secondary | ICD-10-CM | POA: Diagnosis not present

## 2023-07-21 DIAGNOSIS — I130 Hypertensive heart and chronic kidney disease with heart failure and stage 1 through stage 4 chronic kidney disease, or unspecified chronic kidney disease: Secondary | ICD-10-CM | POA: Diagnosis not present

## 2023-07-21 DIAGNOSIS — I4892 Unspecified atrial flutter: Secondary | ICD-10-CM | POA: Diagnosis not present

## 2023-07-21 DIAGNOSIS — Z86711 Personal history of pulmonary embolism: Secondary | ICD-10-CM | POA: Diagnosis not present

## 2023-07-21 DIAGNOSIS — E7800 Pure hypercholesterolemia, unspecified: Secondary | ICD-10-CM | POA: Diagnosis not present

## 2023-07-21 DIAGNOSIS — Z9181 History of falling: Secondary | ICD-10-CM | POA: Diagnosis not present

## 2023-07-21 DIAGNOSIS — R42 Dizziness and giddiness: Secondary | ICD-10-CM | POA: Diagnosis not present

## 2023-07-21 DIAGNOSIS — M1990 Unspecified osteoarthritis, unspecified site: Secondary | ICD-10-CM | POA: Diagnosis not present

## 2023-07-21 DIAGNOSIS — E1122 Type 2 diabetes mellitus with diabetic chronic kidney disease: Secondary | ICD-10-CM | POA: Diagnosis not present

## 2023-07-21 DIAGNOSIS — N183 Chronic kidney disease, stage 3 unspecified: Secondary | ICD-10-CM | POA: Diagnosis not present

## 2023-07-21 DIAGNOSIS — Z7982 Long term (current) use of aspirin: Secondary | ICD-10-CM | POA: Diagnosis not present

## 2023-07-21 DIAGNOSIS — Z952 Presence of prosthetic heart valve: Secondary | ICD-10-CM | POA: Diagnosis not present

## 2023-07-21 DIAGNOSIS — K219 Gastro-esophageal reflux disease without esophagitis: Secondary | ICD-10-CM | POA: Diagnosis not present

## 2023-07-23 DIAGNOSIS — Z8616 Personal history of COVID-19: Secondary | ICD-10-CM | POA: Diagnosis not present

## 2023-07-23 DIAGNOSIS — I6522 Occlusion and stenosis of left carotid artery: Secondary | ICD-10-CM | POA: Diagnosis not present

## 2023-07-23 DIAGNOSIS — K219 Gastro-esophageal reflux disease without esophagitis: Secondary | ICD-10-CM | POA: Diagnosis not present

## 2023-07-23 DIAGNOSIS — M1990 Unspecified osteoarthritis, unspecified site: Secondary | ICD-10-CM | POA: Diagnosis not present

## 2023-07-23 DIAGNOSIS — Z950 Presence of cardiac pacemaker: Secondary | ICD-10-CM | POA: Diagnosis not present

## 2023-07-23 DIAGNOSIS — E7800 Pure hypercholesterolemia, unspecified: Secondary | ICD-10-CM | POA: Diagnosis not present

## 2023-07-23 DIAGNOSIS — E1122 Type 2 diabetes mellitus with diabetic chronic kidney disease: Secondary | ICD-10-CM | POA: Diagnosis not present

## 2023-07-23 DIAGNOSIS — Z86711 Personal history of pulmonary embolism: Secondary | ICD-10-CM | POA: Diagnosis not present

## 2023-07-23 DIAGNOSIS — E039 Hypothyroidism, unspecified: Secondary | ICD-10-CM | POA: Diagnosis not present

## 2023-07-23 DIAGNOSIS — I4892 Unspecified atrial flutter: Secondary | ICD-10-CM | POA: Diagnosis not present

## 2023-07-23 DIAGNOSIS — I480 Paroxysmal atrial fibrillation: Secondary | ICD-10-CM | POA: Diagnosis not present

## 2023-07-23 DIAGNOSIS — I130 Hypertensive heart and chronic kidney disease with heart failure and stage 1 through stage 4 chronic kidney disease, or unspecified chronic kidney disease: Secondary | ICD-10-CM | POA: Diagnosis not present

## 2023-07-23 DIAGNOSIS — I5033 Acute on chronic diastolic (congestive) heart failure: Secondary | ICD-10-CM | POA: Diagnosis not present

## 2023-07-23 DIAGNOSIS — Z7984 Long term (current) use of oral hypoglycemic drugs: Secondary | ICD-10-CM | POA: Diagnosis not present

## 2023-07-23 DIAGNOSIS — Z952 Presence of prosthetic heart valve: Secondary | ICD-10-CM | POA: Diagnosis not present

## 2023-07-23 DIAGNOSIS — Z9181 History of falling: Secondary | ICD-10-CM | POA: Diagnosis not present

## 2023-07-23 DIAGNOSIS — Z7982 Long term (current) use of aspirin: Secondary | ICD-10-CM | POA: Diagnosis not present

## 2023-07-23 DIAGNOSIS — J449 Chronic obstructive pulmonary disease, unspecified: Secondary | ICD-10-CM | POA: Diagnosis not present

## 2023-07-23 DIAGNOSIS — N183 Chronic kidney disease, stage 3 unspecified: Secondary | ICD-10-CM | POA: Diagnosis not present

## 2023-07-23 DIAGNOSIS — R410 Disorientation, unspecified: Secondary | ICD-10-CM | POA: Diagnosis not present

## 2023-07-23 DIAGNOSIS — R42 Dizziness and giddiness: Secondary | ICD-10-CM | POA: Diagnosis not present

## 2023-07-24 ENCOUNTER — Telehealth: Payer: Self-pay

## 2023-07-24 DIAGNOSIS — I4892 Unspecified atrial flutter: Secondary | ICD-10-CM | POA: Diagnosis not present

## 2023-07-24 DIAGNOSIS — Z950 Presence of cardiac pacemaker: Secondary | ICD-10-CM | POA: Diagnosis not present

## 2023-07-24 DIAGNOSIS — J449 Chronic obstructive pulmonary disease, unspecified: Secondary | ICD-10-CM | POA: Diagnosis not present

## 2023-07-24 DIAGNOSIS — N183 Chronic kidney disease, stage 3 unspecified: Secondary | ICD-10-CM | POA: Diagnosis not present

## 2023-07-24 DIAGNOSIS — R410 Disorientation, unspecified: Secondary | ICD-10-CM | POA: Diagnosis not present

## 2023-07-24 DIAGNOSIS — Z8616 Personal history of COVID-19: Secondary | ICD-10-CM | POA: Diagnosis not present

## 2023-07-24 DIAGNOSIS — E1122 Type 2 diabetes mellitus with diabetic chronic kidney disease: Secondary | ICD-10-CM | POA: Diagnosis not present

## 2023-07-24 DIAGNOSIS — E7800 Pure hypercholesterolemia, unspecified: Secondary | ICD-10-CM | POA: Diagnosis not present

## 2023-07-24 DIAGNOSIS — Z9181 History of falling: Secondary | ICD-10-CM | POA: Diagnosis not present

## 2023-07-24 DIAGNOSIS — I6522 Occlusion and stenosis of left carotid artery: Secondary | ICD-10-CM | POA: Diagnosis not present

## 2023-07-24 DIAGNOSIS — K219 Gastro-esophageal reflux disease without esophagitis: Secondary | ICD-10-CM | POA: Diagnosis not present

## 2023-07-24 DIAGNOSIS — Z7984 Long term (current) use of oral hypoglycemic drugs: Secondary | ICD-10-CM | POA: Diagnosis not present

## 2023-07-24 DIAGNOSIS — I130 Hypertensive heart and chronic kidney disease with heart failure and stage 1 through stage 4 chronic kidney disease, or unspecified chronic kidney disease: Secondary | ICD-10-CM | POA: Diagnosis not present

## 2023-07-24 DIAGNOSIS — I5033 Acute on chronic diastolic (congestive) heart failure: Secondary | ICD-10-CM | POA: Diagnosis not present

## 2023-07-24 DIAGNOSIS — Z6841 Body Mass Index (BMI) 40.0 and over, adult: Secondary | ICD-10-CM

## 2023-07-24 DIAGNOSIS — Z86711 Personal history of pulmonary embolism: Secondary | ICD-10-CM | POA: Diagnosis not present

## 2023-07-24 DIAGNOSIS — F02818 Dementia in other diseases classified elsewhere, unspecified severity, with other behavioral disturbance: Secondary | ICD-10-CM

## 2023-07-24 DIAGNOSIS — M1990 Unspecified osteoarthritis, unspecified site: Secondary | ICD-10-CM | POA: Diagnosis not present

## 2023-07-24 DIAGNOSIS — E039 Hypothyroidism, unspecified: Secondary | ICD-10-CM | POA: Diagnosis not present

## 2023-07-24 DIAGNOSIS — Z7982 Long term (current) use of aspirin: Secondary | ICD-10-CM | POA: Diagnosis not present

## 2023-07-24 DIAGNOSIS — Z952 Presence of prosthetic heart valve: Secondary | ICD-10-CM | POA: Diagnosis not present

## 2023-07-24 DIAGNOSIS — R42 Dizziness and giddiness: Secondary | ICD-10-CM | POA: Diagnosis not present

## 2023-07-24 DIAGNOSIS — I480 Paroxysmal atrial fibrillation: Secondary | ICD-10-CM | POA: Diagnosis not present

## 2023-07-25 ENCOUNTER — Other Ambulatory Visit: Payer: Self-pay

## 2023-07-25 NOTE — Patient Outreach (Signed)
 Complex Care Management   Visit Note  07/25/2023  Name:  Jessica [name] MRN: [id number] DOB: Apr 19, 1933  Situation: Referral received for Complex Care Management related to COPD I obtained verbal consent from Patient.  Visit completed with patient  on the phone  Background:   Past Medical History:  Diagnosis Date   Anxiety    Atrial fibrillation and flutter (HCC)    detected by PPM interrogation (mostly atrial flutter)   AV block, 2nd degree 10/2012   s/p MDT ADDRL1 pacemaker implantation 10/10/2012 by Dr Nunzio Belch   Chronic diastolic heart failure (HCC)    Depression    GERD (gastroesophageal reflux disease)    HH (hiatus hernia)    History of pulmonary embolism 10/2008   Bilateral   Hyperlipidemia    Hypertension    Hypothyroidism    Obesity    Osteoarthritis    Peripheral neuropathy    Right leg   Presence of permanent cardiac pacemaker    Pulmonary nodule    in the lingula. Noted on pre TAVR CT. Will require follow up   Renal insufficiency 2010   S/P TAVR (transcatheter aortic valve replacement)    Shingles 09/17/2014   right lower quadrant   Type II or unspecified type diabetes mellitus without mention of complication, not stated as uncontrolled 2009    Assessment: Patient Reported Symptoms:  Cognitive Cognitive Status: Alert and oriented to person, place, and time   Health Maintenance Behaviors: Annual physical exam Healing Pattern: Average Health Facilitated by: Healthy diet, Rest, Prayer/meditation  Neurological Neurological Review of Symptoms: No symptoms reported    HEENT HEENT Symptoms Reported: No symptoms reported      Cardiovascular Cardiovascular Symptoms Reported: No symptoms reported Does patient have uncontrolled Hypertension?: No Cardiovascular Conditions: Heart failure, Hypertension Cardiovascular Management Strategies: Medication therapy, Routine screening, Exercise, Fluid modification Cardiovascular Self-Management Outcome: 4 (good)   Respiratory Respiratory Symptoms Reported: No symptoms reported Respiratory Conditions: COPD Respiratory Self-Management Outcome: 4 (good)  Endocrine Patient reports the following symptoms related to hypoglycemia or hyperglycemia : No symptoms reported Is patient diabetic?: Yes Is patient checking blood sugars at home?: No Endocrine Management Strategies: Medication therapy, Routine screening Endocrine Self-Management Outcome: 4 (good)  Gastrointestinal Gastrointestinal Symptoms Reported: No symptoms reported      Genitourinary Genitourinary Symptoms Reported: Other Other Genitourinary Symptoms: reports stress incontinence Genitourinary Management Strategies: Incontinence garment/pad Genitourinary Self-Management Outcome: 4 (good)  Integumentary Integumentary Symptoms Reported: No symptoms reported    Musculoskeletal Musculoskelatal Symptoms Reviewed: Difficulty walking, Unsteady gait, Weakness Musculoskeletal Management Strategies: Exercise, Routine screening Musculoskeletal Self-Management Outcome: 3 (uncertain)      Psychosocial Psychosocial Symptoms Reported: Anxiety - if selected complete GAD     Quality of Family Relationships:  (limited support) Do you feel physically threatened by others?: No      07/05/2023   11:37 AM  Depression screen PHQ 2/9  Decreased Interest 0  Down, Depressed, Hopeless 0  PHQ - 2 Score 0  Altered sleeping 0  Tired, decreased energy 1  Change in appetite 0  Feeling bad or failure about yourself  0  Trouble concentrating 0  Moving slowly or fidgety/restless 3  Suicidal thoughts 0  PHQ-9 Score 4  Difficult doing work/chores Not difficult at all    Vitals:   07/25/23 0929  BP: 118/67    Medications Reviewed Today     Reviewed by Ashraf Mesta M, RN (Registered Nurse) on 07/25/23 at [phone]  Med List Status: <None>   Medication Order Taking? Sig Documenting  Provider Last Dose Status Informant  albuterol  (VENTOLIN  HFA) 108 (90 Base)  MCG/ACT inhaler [id number] No Inhale 2 puffs into the lungs every 6 (six) hours as needed for wheezing or shortness of breath.  Patient not taking: Reported on 07/25/2023   Gonfa, Taye T, MD Not Taking Active Nursing Home Medication Administration Guide (MAG)  allopurinol  (ZYLOPRIM ) 100 MG tablet [id number] Yes TAKE 1 TABLET BY MOUTH DAILY Plotnikov, Aleksei V, MD Taking Active   aspirin  EC 81 MG tablet [id number] Yes Take 162 mg by mouth daily. Swallow whole. [provider] Taking Active Self  b complex vitamins tablet [id number] Yes Take 1 tablet by mouth daily. [provider] Taking Active Nursing Home Medication Administration Guide (MAG)  cefadroxil  (DURICEF) 500 MG capsule [id number] Yes Take 1 capsule (500 mg total) by mouth 2 (two) times daily. Jamesetta Mcbride, MD Taking Active   Cholecalciferol  1000 UNITS tablet [id number] Yes Take 1,000 Units by mouth daily. [provider] Taking Active Nursing Home Medication Administration Guide (MAG)  CINNAMON PO [id number] Yes Take 1 capsule by mouth daily. [provider] Taking Active   empagliflozin  (JARDIANCE ) 10 MG TABS tablet [id number] Yes Take 1 tablet (10 mg total) by mouth daily. Plotnikov, Oakley Bellman, MD Taking Active   ezetimibe  (ZETIA ) 10 MG tablet [id number] Yes TAKE 1 TABLET BY MOUTH DAILY Maudine Sos, MD Taking Active   furosemide  (LASIX ) 40 MG tablet [id number] Yes TAKE 1 TABLET BY MOUTH TWICE  DAILY Plotnikov, Aleksei V, MD Taking Active   levothyroxine  (SYNTHROID ) 75 MCG tablet [id number] Yes TAKE 1 TABLET BY MOUTH DAILY Plotnikov, Aleksei V, MD Taking Active   LORazepam  (ATIVAN ) 0.5 MG tablet [id number] Yes Take 1 tablet (0.5 mg total) by mouth 2 (two) times daily as needed. for anxiety Plotnikov, Oakley Bellman, MD Taking Active   metFORMIN  (GLUCOPHAGE ) 500 MG tablet [id number] Yes TAKE 1 TABLET BY MOUTH TWICE  DAILY WITH MEALS Plotnikov, Aleksei V, MD Taking Active   metoprolol  succinate (TOPROL -XL) 50 MG 24 hr  tablet [id number] Yes TAKE 1 TABLET BY MOUTH DAILY  WITH OR IMMEDIATELY FOLLOWING A  MEAL Plotnikov, Aleksei V, MD Taking Active   Multiple Vitamin (MULTIVITAMIN) tablet [id number] Yes Take 1 tablet by mouth daily. [provider] Taking Active Nursing Home Medication Administration Guide (MAG)  nystatin  powder [id number] Yes Apply 1 Application topically daily. Apply to under breast topically every day shift for redness/moisture. [provider] Taking Active Nursing Home Medication Administration Guide (MAG)  potassium chloride  SA (KLOR-CON  M) 20 MEQ tablet [id number] Yes TAKE 1 TABLET BY MOUTH DAILY Plotnikov, Aleksei V, MD Taking Active   repaglinide  (PRANDIN ) 2 MG tablet [id number] Yes TAKE 2 TABLETS BY MOUTH 3 TIMES  DAILY BEFORE MEALS Plotnikov, Aleksei V, MD Taking Active            Med Note (Natasia Sanko M   Wed Jan 10, 2023 11:41 AM) Reports takes 3 tablets twice a day  rosuvastatin  (CRESTOR ) 10 MG tablet [id number] Yes TAKE 1 TABLET BY MOUTH UP TO 3  TIMES WEEKLY AS Reymundo Caulk, MD Taking Active   sacubitril -valsartan  (ENTRESTO ) 97-103 MG [id number] Yes Take 1 tablet by mouth 2 (two) times daily. Gonfa, Taye T, MD Taking Active Nursing Home Medication Administration Guide (MAG)           Med Note Keane Passe A   Thu Aug 10, 2022  9:52 AM) Has PAP  spironolactone  (ALDACTONE ) 25 MG tablet [id number] Yes Take 0.5 tablets (12.5 mg total)  by mouth daily. Clearnce Curia, NP Taking Active   temazepam  (RESTORIL ) 15 MG capsule [id number] Yes TAKE 1 CAPSULE BY MOUTH EVERY NIGHT AT BEDTIME AS NEEDED Plotnikov, Aleksei V, MD Taking Active   venlafaxine  XR (EFFEXOR -XR) 75 MG 24 hr capsule [id number] Yes TAKE 1 CAPSULE BY MOUTH DAILY  WITH BREAKFAST Plotnikov, Aleksei V, MD Taking Active   Med List Note Eugenio Hew, CPhT 04/18/22 1633): [facility] [phone]          Recommendation:   PCP Follow-up Patient to continue to follow up with providers as scheduled  and as needed. Patient to continue to work with [facility] for therapy PT/OT and bath aid and maintain fall prevention precautions.  Follow Up Plan:   Telephone follow up appointment date/time:  08/29/23 at 10:00 am Update LCSW re: request for meals on wheels  Lindi Revering, RN, MSN, BSN, CCM [employer], Population Health Case Manager Phone: [phone]

## 2023-07-25 NOTE — Patient Instructions (Signed)
 Visit Information  Thank you for taking time to visit with me today. Please don't hesitate to contact me if I can be of assistance to you before our next scheduled appointment.  Our next appointment is by telephone on 08/29/23 at 10:00 am Please call the care guide team at [phone] if you need to cancel or reschedule your appointment.   Following is a copy of your care plan:   Goals Addressed             This Visit's Progress    VBCI RN Care Plan       Problems:  Chronic Disease Management support and education needs related to COPD and decreased mobility Lacks caregiver support.  Goal: Over the next 90 days the Patient will attend all scheduled medical appointments: including primary care provider,  as evidenced by patient report and review of chart        continue to work with RN Care Manager and/or Social Worker to address care management and care coordination needs related to COPD and decreased mobility as evidenced by adherence to care management team scheduled appointments     demonstrate Ongoing adherence to prescribed treatment plan for COPD as evidenced by patient report and review of chart take all medications exactly as prescribed and will call provider for medication related questions as evidenced by patient report and review of chart     Interventions:   COPD Interventions: Provided education about and advised patient to utilize infection prevention strategies to reduce risk of respiratory infection Discussed the importance of adequate rest and management of fatigue with COPD Referral to community care resources-re: meals on wheels application   Falls Interventions: Discussed fall prevention strategies Provided written and verbal education re: potential causes of falls and Fall prevention strategies Advised patient of importance of notifying provider of falls  Patient Self-Care Activities:  Attend all scheduled provider appointments Call provider office for  new concerns or questions  Take medications as prescribed   Continue to work with PT/OT and perform exercises as recommended Continue to work with LCSW regarding care needs Maintain fall prevention strategies   Plan:  Telephone follow up appointment with care management team member scheduled for:  08/29/23        Please call the Suicide and Crisis Lifeline: 988 call the [place]  National Suicide Prevention Lifeline: 706-240-6794 or TTY: (502)278-0221 TTY 902 461 4430) to talk to a trained counselor if you are experiencing a Mental Health or Behavioral Health Crisis or need someone to talk to.  Patient verbalizes understanding of instructions and care plan provided today and agrees to view in MyChart. Active MyChart status and patient understanding of how to access instructions and care plan via MyChart confirmed with patient.     Lindi Revering, RN, MSN, BSN, CCM [place]  [facility], Population Health Case Manager Phone: [phone]

## 2023-07-26 DIAGNOSIS — E039 Hypothyroidism, unspecified: Secondary | ICD-10-CM | POA: Diagnosis not present

## 2023-07-26 DIAGNOSIS — I130 Hypertensive heart and chronic kidney disease with heart failure and stage 1 through stage 4 chronic kidney disease, or unspecified chronic kidney disease: Secondary | ICD-10-CM | POA: Diagnosis not present

## 2023-07-26 DIAGNOSIS — J449 Chronic obstructive pulmonary disease, unspecified: Secondary | ICD-10-CM | POA: Diagnosis not present

## 2023-07-26 DIAGNOSIS — I6522 Occlusion and stenosis of left carotid artery: Secondary | ICD-10-CM | POA: Diagnosis not present

## 2023-07-26 DIAGNOSIS — I5033 Acute on chronic diastolic (congestive) heart failure: Secondary | ICD-10-CM | POA: Diagnosis not present

## 2023-07-26 DIAGNOSIS — K219 Gastro-esophageal reflux disease without esophagitis: Secondary | ICD-10-CM | POA: Diagnosis not present

## 2023-07-26 DIAGNOSIS — Z7984 Long term (current) use of oral hypoglycemic drugs: Secondary | ICD-10-CM | POA: Diagnosis not present

## 2023-07-26 DIAGNOSIS — R410 Disorientation, unspecified: Secondary | ICD-10-CM | POA: Diagnosis not present

## 2023-07-26 DIAGNOSIS — E7800 Pure hypercholesterolemia, unspecified: Secondary | ICD-10-CM | POA: Diagnosis not present

## 2023-07-26 DIAGNOSIS — I4892 Unspecified atrial flutter: Secondary | ICD-10-CM | POA: Diagnosis not present

## 2023-07-26 DIAGNOSIS — Z7982 Long term (current) use of aspirin: Secondary | ICD-10-CM | POA: Diagnosis not present

## 2023-07-26 DIAGNOSIS — R42 Dizziness and giddiness: Secondary | ICD-10-CM | POA: Diagnosis not present

## 2023-07-26 DIAGNOSIS — N183 Chronic kidney disease, stage 3 unspecified: Secondary | ICD-10-CM | POA: Diagnosis not present

## 2023-07-26 DIAGNOSIS — I480 Paroxysmal atrial fibrillation: Secondary | ICD-10-CM | POA: Diagnosis not present

## 2023-07-26 DIAGNOSIS — M1990 Unspecified osteoarthritis, unspecified site: Secondary | ICD-10-CM | POA: Diagnosis not present

## 2023-07-26 DIAGNOSIS — Z86711 Personal history of pulmonary embolism: Secondary | ICD-10-CM | POA: Diagnosis not present

## 2023-07-26 DIAGNOSIS — Z9181 History of falling: Secondary | ICD-10-CM | POA: Diagnosis not present

## 2023-07-26 DIAGNOSIS — E1122 Type 2 diabetes mellitus with diabetic chronic kidney disease: Secondary | ICD-10-CM | POA: Diagnosis not present

## 2023-07-26 DIAGNOSIS — Z950 Presence of cardiac pacemaker: Secondary | ICD-10-CM | POA: Diagnosis not present

## 2023-07-26 DIAGNOSIS — Z952 Presence of prosthetic heart valve: Secondary | ICD-10-CM | POA: Diagnosis not present

## 2023-07-26 NOTE — Telephone Encounter (Signed)
 Noted. Thanks.

## 2023-07-30 DIAGNOSIS — E1122 Type 2 diabetes mellitus with diabetic chronic kidney disease: Secondary | ICD-10-CM | POA: Diagnosis not present

## 2023-07-30 DIAGNOSIS — K219 Gastro-esophageal reflux disease without esophagitis: Secondary | ICD-10-CM | POA: Diagnosis not present

## 2023-07-30 DIAGNOSIS — Z8616 Personal history of COVID-19: Secondary | ICD-10-CM | POA: Diagnosis not present

## 2023-07-30 DIAGNOSIS — I4892 Unspecified atrial flutter: Secondary | ICD-10-CM | POA: Diagnosis not present

## 2023-07-30 DIAGNOSIS — N183 Chronic kidney disease, stage 3 unspecified: Secondary | ICD-10-CM | POA: Diagnosis not present

## 2023-07-30 DIAGNOSIS — I480 Paroxysmal atrial fibrillation: Secondary | ICD-10-CM | POA: Diagnosis not present

## 2023-07-30 DIAGNOSIS — R42 Dizziness and giddiness: Secondary | ICD-10-CM | POA: Diagnosis not present

## 2023-07-30 DIAGNOSIS — E039 Hypothyroidism, unspecified: Secondary | ICD-10-CM | POA: Diagnosis not present

## 2023-07-30 DIAGNOSIS — Z9181 History of falling: Secondary | ICD-10-CM | POA: Diagnosis not present

## 2023-07-30 DIAGNOSIS — Z7984 Long term (current) use of oral hypoglycemic drugs: Secondary | ICD-10-CM | POA: Diagnosis not present

## 2023-07-30 DIAGNOSIS — E7800 Pure hypercholesterolemia, unspecified: Secondary | ICD-10-CM | POA: Diagnosis not present

## 2023-07-30 DIAGNOSIS — I130 Hypertensive heart and chronic kidney disease with heart failure and stage 1 through stage 4 chronic kidney disease, or unspecified chronic kidney disease: Secondary | ICD-10-CM | POA: Diagnosis not present

## 2023-07-30 DIAGNOSIS — M1990 Unspecified osteoarthritis, unspecified site: Secondary | ICD-10-CM | POA: Diagnosis not present

## 2023-07-30 DIAGNOSIS — I5033 Acute on chronic diastolic (congestive) heart failure: Secondary | ICD-10-CM | POA: Diagnosis not present

## 2023-07-30 DIAGNOSIS — Z86711 Personal history of pulmonary embolism: Secondary | ICD-10-CM | POA: Diagnosis not present

## 2023-07-30 DIAGNOSIS — Z7982 Long term (current) use of aspirin: Secondary | ICD-10-CM | POA: Diagnosis not present

## 2023-07-30 DIAGNOSIS — J449 Chronic obstructive pulmonary disease, unspecified: Secondary | ICD-10-CM | POA: Diagnosis not present

## 2023-07-30 DIAGNOSIS — Z952 Presence of prosthetic heart valve: Secondary | ICD-10-CM | POA: Diagnosis not present

## 2023-07-30 DIAGNOSIS — I6522 Occlusion and stenosis of left carotid artery: Secondary | ICD-10-CM | POA: Diagnosis not present

## 2023-07-30 DIAGNOSIS — R410 Disorientation, unspecified: Secondary | ICD-10-CM | POA: Diagnosis not present

## 2023-07-30 DIAGNOSIS — Z950 Presence of cardiac pacemaker: Secondary | ICD-10-CM | POA: Diagnosis not present

## 2023-07-31 DIAGNOSIS — E039 Hypothyroidism, unspecified: Secondary | ICD-10-CM | POA: Diagnosis not present

## 2023-07-31 DIAGNOSIS — Z86711 Personal history of pulmonary embolism: Secondary | ICD-10-CM | POA: Diagnosis not present

## 2023-07-31 DIAGNOSIS — I6522 Occlusion and stenosis of left carotid artery: Secondary | ICD-10-CM | POA: Diagnosis not present

## 2023-07-31 DIAGNOSIS — N183 Chronic kidney disease, stage 3 unspecified: Secondary | ICD-10-CM | POA: Diagnosis not present

## 2023-07-31 DIAGNOSIS — R410 Disorientation, unspecified: Secondary | ICD-10-CM | POA: Diagnosis not present

## 2023-07-31 DIAGNOSIS — I130 Hypertensive heart and chronic kidney disease with heart failure and stage 1 through stage 4 chronic kidney disease, or unspecified chronic kidney disease: Secondary | ICD-10-CM | POA: Diagnosis not present

## 2023-07-31 DIAGNOSIS — I5033 Acute on chronic diastolic (congestive) heart failure: Secondary | ICD-10-CM | POA: Diagnosis not present

## 2023-07-31 DIAGNOSIS — E7800 Pure hypercholesterolemia, unspecified: Secondary | ICD-10-CM | POA: Diagnosis not present

## 2023-07-31 DIAGNOSIS — I480 Paroxysmal atrial fibrillation: Secondary | ICD-10-CM | POA: Diagnosis not present

## 2023-07-31 DIAGNOSIS — Z7982 Long term (current) use of aspirin: Secondary | ICD-10-CM | POA: Diagnosis not present

## 2023-07-31 DIAGNOSIS — E1122 Type 2 diabetes mellitus with diabetic chronic kidney disease: Secondary | ICD-10-CM | POA: Diagnosis not present

## 2023-07-31 DIAGNOSIS — Z9181 History of falling: Secondary | ICD-10-CM | POA: Diagnosis not present

## 2023-07-31 DIAGNOSIS — Z952 Presence of prosthetic heart valve: Secondary | ICD-10-CM | POA: Diagnosis not present

## 2023-07-31 DIAGNOSIS — Z950 Presence of cardiac pacemaker: Secondary | ICD-10-CM | POA: Diagnosis not present

## 2023-07-31 DIAGNOSIS — J449 Chronic obstructive pulmonary disease, unspecified: Secondary | ICD-10-CM | POA: Diagnosis not present

## 2023-07-31 DIAGNOSIS — K219 Gastro-esophageal reflux disease without esophagitis: Secondary | ICD-10-CM | POA: Diagnosis not present

## 2023-07-31 DIAGNOSIS — I4892 Unspecified atrial flutter: Secondary | ICD-10-CM | POA: Diagnosis not present

## 2023-07-31 DIAGNOSIS — R42 Dizziness and giddiness: Secondary | ICD-10-CM | POA: Diagnosis not present

## 2023-07-31 DIAGNOSIS — Z8616 Personal history of COVID-19: Secondary | ICD-10-CM | POA: Diagnosis not present

## 2023-07-31 DIAGNOSIS — M1990 Unspecified osteoarthritis, unspecified site: Secondary | ICD-10-CM | POA: Diagnosis not present

## 2023-07-31 DIAGNOSIS — Z7984 Long term (current) use of oral hypoglycemic drugs: Secondary | ICD-10-CM | POA: Diagnosis not present

## 2023-08-01 DIAGNOSIS — J449 Chronic obstructive pulmonary disease, unspecified: Secondary | ICD-10-CM | POA: Diagnosis not present

## 2023-08-01 DIAGNOSIS — Z7982 Long term (current) use of aspirin: Secondary | ICD-10-CM | POA: Diagnosis not present

## 2023-08-01 DIAGNOSIS — Z9181 History of falling: Secondary | ICD-10-CM | POA: Diagnosis not present

## 2023-08-01 DIAGNOSIS — Z7984 Long term (current) use of oral hypoglycemic drugs: Secondary | ICD-10-CM | POA: Diagnosis not present

## 2023-08-01 DIAGNOSIS — E7800 Pure hypercholesterolemia, unspecified: Secondary | ICD-10-CM | POA: Diagnosis not present

## 2023-08-01 DIAGNOSIS — R42 Dizziness and giddiness: Secondary | ICD-10-CM | POA: Diagnosis not present

## 2023-08-01 DIAGNOSIS — R410 Disorientation, unspecified: Secondary | ICD-10-CM | POA: Diagnosis not present

## 2023-08-01 DIAGNOSIS — Z952 Presence of prosthetic heart valve: Secondary | ICD-10-CM | POA: Diagnosis not present

## 2023-08-01 DIAGNOSIS — N183 Chronic kidney disease, stage 3 unspecified: Secondary | ICD-10-CM | POA: Diagnosis not present

## 2023-08-01 DIAGNOSIS — E1122 Type 2 diabetes mellitus with diabetic chronic kidney disease: Secondary | ICD-10-CM | POA: Diagnosis not present

## 2023-08-01 DIAGNOSIS — I6522 Occlusion and stenosis of left carotid artery: Secondary | ICD-10-CM | POA: Diagnosis not present

## 2023-08-01 DIAGNOSIS — I4892 Unspecified atrial flutter: Secondary | ICD-10-CM | POA: Diagnosis not present

## 2023-08-01 DIAGNOSIS — I480 Paroxysmal atrial fibrillation: Secondary | ICD-10-CM | POA: Diagnosis not present

## 2023-08-01 DIAGNOSIS — K219 Gastro-esophageal reflux disease without esophagitis: Secondary | ICD-10-CM | POA: Diagnosis not present

## 2023-08-01 DIAGNOSIS — E039 Hypothyroidism, unspecified: Secondary | ICD-10-CM | POA: Diagnosis not present

## 2023-08-01 DIAGNOSIS — M1990 Unspecified osteoarthritis, unspecified site: Secondary | ICD-10-CM | POA: Diagnosis not present

## 2023-08-01 DIAGNOSIS — Z86711 Personal history of pulmonary embolism: Secondary | ICD-10-CM | POA: Diagnosis not present

## 2023-08-01 DIAGNOSIS — Z950 Presence of cardiac pacemaker: Secondary | ICD-10-CM | POA: Diagnosis not present

## 2023-08-01 DIAGNOSIS — I5033 Acute on chronic diastolic (congestive) heart failure: Secondary | ICD-10-CM | POA: Diagnosis not present

## 2023-08-01 DIAGNOSIS — I130 Hypertensive heart and chronic kidney disease with heart failure and stage 1 through stage 4 chronic kidney disease, or unspecified chronic kidney disease: Secondary | ICD-10-CM | POA: Diagnosis not present

## 2023-08-01 DIAGNOSIS — Z8616 Personal history of COVID-19: Secondary | ICD-10-CM | POA: Diagnosis not present

## 2023-08-02 DIAGNOSIS — E7800 Pure hypercholesterolemia, unspecified: Secondary | ICD-10-CM | POA: Diagnosis not present

## 2023-08-02 DIAGNOSIS — Z952 Presence of prosthetic heart valve: Secondary | ICD-10-CM | POA: Diagnosis not present

## 2023-08-02 DIAGNOSIS — E039 Hypothyroidism, unspecified: Secondary | ICD-10-CM | POA: Diagnosis not present

## 2023-08-02 DIAGNOSIS — I480 Paroxysmal atrial fibrillation: Secondary | ICD-10-CM | POA: Diagnosis not present

## 2023-08-02 DIAGNOSIS — M1990 Unspecified osteoarthritis, unspecified site: Secondary | ICD-10-CM | POA: Diagnosis not present

## 2023-08-02 DIAGNOSIS — I130 Hypertensive heart and chronic kidney disease with heart failure and stage 1 through stage 4 chronic kidney disease, or unspecified chronic kidney disease: Secondary | ICD-10-CM | POA: Diagnosis not present

## 2023-08-02 DIAGNOSIS — E1122 Type 2 diabetes mellitus with diabetic chronic kidney disease: Secondary | ICD-10-CM | POA: Diagnosis not present

## 2023-08-02 DIAGNOSIS — R410 Disorientation, unspecified: Secondary | ICD-10-CM | POA: Diagnosis not present

## 2023-08-02 DIAGNOSIS — Z7984 Long term (current) use of oral hypoglycemic drugs: Secondary | ICD-10-CM | POA: Diagnosis not present

## 2023-08-02 DIAGNOSIS — Z950 Presence of cardiac pacemaker: Secondary | ICD-10-CM | POA: Diagnosis not present

## 2023-08-02 DIAGNOSIS — Z8616 Personal history of COVID-19: Secondary | ICD-10-CM | POA: Diagnosis not present

## 2023-08-02 DIAGNOSIS — R42 Dizziness and giddiness: Secondary | ICD-10-CM | POA: Diagnosis not present

## 2023-08-02 DIAGNOSIS — N183 Chronic kidney disease, stage 3 unspecified: Secondary | ICD-10-CM | POA: Diagnosis not present

## 2023-08-02 DIAGNOSIS — K219 Gastro-esophageal reflux disease without esophagitis: Secondary | ICD-10-CM | POA: Diagnosis not present

## 2023-08-02 DIAGNOSIS — I5033 Acute on chronic diastolic (congestive) heart failure: Secondary | ICD-10-CM | POA: Diagnosis not present

## 2023-08-02 DIAGNOSIS — I6522 Occlusion and stenosis of left carotid artery: Secondary | ICD-10-CM | POA: Diagnosis not present

## 2023-08-02 DIAGNOSIS — Z7982 Long term (current) use of aspirin: Secondary | ICD-10-CM | POA: Diagnosis not present

## 2023-08-02 DIAGNOSIS — Z86711 Personal history of pulmonary embolism: Secondary | ICD-10-CM | POA: Diagnosis not present

## 2023-08-02 DIAGNOSIS — I4892 Unspecified atrial flutter: Secondary | ICD-10-CM | POA: Diagnosis not present

## 2023-08-02 DIAGNOSIS — J449 Chronic obstructive pulmonary disease, unspecified: Secondary | ICD-10-CM | POA: Diagnosis not present

## 2023-08-02 DIAGNOSIS — Z9181 History of falling: Secondary | ICD-10-CM | POA: Diagnosis not present

## 2023-08-04 ENCOUNTER — Other Ambulatory Visit (HOSPITAL_BASED_OUTPATIENT_CLINIC_OR_DEPARTMENT_OTHER): Payer: Self-pay | Admitting: Cardiovascular Disease

## 2023-08-04 ENCOUNTER — Other Ambulatory Visit: Payer: Self-pay | Admitting: Internal Medicine

## 2023-08-06 DIAGNOSIS — Z7982 Long term (current) use of aspirin: Secondary | ICD-10-CM | POA: Diagnosis not present

## 2023-08-06 DIAGNOSIS — R42 Dizziness and giddiness: Secondary | ICD-10-CM | POA: Diagnosis not present

## 2023-08-06 DIAGNOSIS — E039 Hypothyroidism, unspecified: Secondary | ICD-10-CM | POA: Diagnosis not present

## 2023-08-06 DIAGNOSIS — Z8616 Personal history of COVID-19: Secondary | ICD-10-CM | POA: Diagnosis not present

## 2023-08-06 DIAGNOSIS — Z950 Presence of cardiac pacemaker: Secondary | ICD-10-CM | POA: Diagnosis not present

## 2023-08-06 DIAGNOSIS — E7800 Pure hypercholesterolemia, unspecified: Secondary | ICD-10-CM | POA: Diagnosis not present

## 2023-08-06 DIAGNOSIS — Z952 Presence of prosthetic heart valve: Secondary | ICD-10-CM | POA: Diagnosis not present

## 2023-08-06 DIAGNOSIS — Z86711 Personal history of pulmonary embolism: Secondary | ICD-10-CM | POA: Diagnosis not present

## 2023-08-06 DIAGNOSIS — N183 Chronic kidney disease, stage 3 unspecified: Secondary | ICD-10-CM | POA: Diagnosis not present

## 2023-08-06 DIAGNOSIS — I6522 Occlusion and stenosis of left carotid artery: Secondary | ICD-10-CM | POA: Diagnosis not present

## 2023-08-06 DIAGNOSIS — Z7984 Long term (current) use of oral hypoglycemic drugs: Secondary | ICD-10-CM | POA: Diagnosis not present

## 2023-08-06 DIAGNOSIS — E1122 Type 2 diabetes mellitus with diabetic chronic kidney disease: Secondary | ICD-10-CM | POA: Diagnosis not present

## 2023-08-06 DIAGNOSIS — I4892 Unspecified atrial flutter: Secondary | ICD-10-CM | POA: Diagnosis not present

## 2023-08-06 DIAGNOSIS — M1990 Unspecified osteoarthritis, unspecified site: Secondary | ICD-10-CM | POA: Diagnosis not present

## 2023-08-06 DIAGNOSIS — I5033 Acute on chronic diastolic (congestive) heart failure: Secondary | ICD-10-CM | POA: Diagnosis not present

## 2023-08-06 DIAGNOSIS — R410 Disorientation, unspecified: Secondary | ICD-10-CM | POA: Diagnosis not present

## 2023-08-06 DIAGNOSIS — J449 Chronic obstructive pulmonary disease, unspecified: Secondary | ICD-10-CM | POA: Diagnosis not present

## 2023-08-06 DIAGNOSIS — I130 Hypertensive heart and chronic kidney disease with heart failure and stage 1 through stage 4 chronic kidney disease, or unspecified chronic kidney disease: Secondary | ICD-10-CM | POA: Diagnosis not present

## 2023-08-06 DIAGNOSIS — K219 Gastro-esophageal reflux disease without esophagitis: Secondary | ICD-10-CM | POA: Diagnosis not present

## 2023-08-06 DIAGNOSIS — Z9181 History of falling: Secondary | ICD-10-CM | POA: Diagnosis not present

## 2023-08-06 DIAGNOSIS — I480 Paroxysmal atrial fibrillation: Secondary | ICD-10-CM | POA: Diagnosis not present

## 2023-08-07 DIAGNOSIS — Z950 Presence of cardiac pacemaker: Secondary | ICD-10-CM | POA: Diagnosis not present

## 2023-08-07 DIAGNOSIS — Z86711 Personal history of pulmonary embolism: Secondary | ICD-10-CM | POA: Diagnosis not present

## 2023-08-07 DIAGNOSIS — E7800 Pure hypercholesterolemia, unspecified: Secondary | ICD-10-CM | POA: Diagnosis not present

## 2023-08-07 DIAGNOSIS — R42 Dizziness and giddiness: Secondary | ICD-10-CM | POA: Diagnosis not present

## 2023-08-07 DIAGNOSIS — N183 Chronic kidney disease, stage 3 unspecified: Secondary | ICD-10-CM | POA: Diagnosis not present

## 2023-08-07 DIAGNOSIS — Z7982 Long term (current) use of aspirin: Secondary | ICD-10-CM | POA: Diagnosis not present

## 2023-08-07 DIAGNOSIS — E039 Hypothyroidism, unspecified: Secondary | ICD-10-CM | POA: Diagnosis not present

## 2023-08-07 DIAGNOSIS — E1122 Type 2 diabetes mellitus with diabetic chronic kidney disease: Secondary | ICD-10-CM | POA: Diagnosis not present

## 2023-08-07 DIAGNOSIS — I4892 Unspecified atrial flutter: Secondary | ICD-10-CM | POA: Diagnosis not present

## 2023-08-07 DIAGNOSIS — J449 Chronic obstructive pulmonary disease, unspecified: Secondary | ICD-10-CM | POA: Diagnosis not present

## 2023-08-07 DIAGNOSIS — I6522 Occlusion and stenosis of left carotid artery: Secondary | ICD-10-CM | POA: Diagnosis not present

## 2023-08-07 DIAGNOSIS — I5033 Acute on chronic diastolic (congestive) heart failure: Secondary | ICD-10-CM | POA: Diagnosis not present

## 2023-08-07 DIAGNOSIS — Z9181 History of falling: Secondary | ICD-10-CM | POA: Diagnosis not present

## 2023-08-07 DIAGNOSIS — Z952 Presence of prosthetic heart valve: Secondary | ICD-10-CM | POA: Diagnosis not present

## 2023-08-07 DIAGNOSIS — Z8616 Personal history of COVID-19: Secondary | ICD-10-CM | POA: Diagnosis not present

## 2023-08-07 DIAGNOSIS — K219 Gastro-esophageal reflux disease without esophagitis: Secondary | ICD-10-CM | POA: Diagnosis not present

## 2023-08-07 DIAGNOSIS — R410 Disorientation, unspecified: Secondary | ICD-10-CM | POA: Diagnosis not present

## 2023-08-07 DIAGNOSIS — I130 Hypertensive heart and chronic kidney disease with heart failure and stage 1 through stage 4 chronic kidney disease, or unspecified chronic kidney disease: Secondary | ICD-10-CM | POA: Diagnosis not present

## 2023-08-07 DIAGNOSIS — M1990 Unspecified osteoarthritis, unspecified site: Secondary | ICD-10-CM | POA: Diagnosis not present

## 2023-08-07 DIAGNOSIS — I480 Paroxysmal atrial fibrillation: Secondary | ICD-10-CM | POA: Diagnosis not present

## 2023-08-07 DIAGNOSIS — Z7984 Long term (current) use of oral hypoglycemic drugs: Secondary | ICD-10-CM | POA: Diagnosis not present

## 2023-08-08 ENCOUNTER — Telehealth: Payer: Self-pay | Admitting: Internal Medicine

## 2023-08-08 DIAGNOSIS — E1122 Type 2 diabetes mellitus with diabetic chronic kidney disease: Secondary | ICD-10-CM | POA: Diagnosis not present

## 2023-08-08 DIAGNOSIS — R410 Disorientation, unspecified: Secondary | ICD-10-CM | POA: Diagnosis not present

## 2023-08-08 DIAGNOSIS — Z7982 Long term (current) use of aspirin: Secondary | ICD-10-CM | POA: Diagnosis not present

## 2023-08-08 DIAGNOSIS — I130 Hypertensive heart and chronic kidney disease with heart failure and stage 1 through stage 4 chronic kidney disease, or unspecified chronic kidney disease: Secondary | ICD-10-CM | POA: Diagnosis not present

## 2023-08-08 DIAGNOSIS — I480 Paroxysmal atrial fibrillation: Secondary | ICD-10-CM | POA: Diagnosis not present

## 2023-08-08 DIAGNOSIS — J449 Chronic obstructive pulmonary disease, unspecified: Secondary | ICD-10-CM | POA: Diagnosis not present

## 2023-08-08 DIAGNOSIS — Z8616 Personal history of COVID-19: Secondary | ICD-10-CM | POA: Diagnosis not present

## 2023-08-08 DIAGNOSIS — Z950 Presence of cardiac pacemaker: Secondary | ICD-10-CM | POA: Diagnosis not present

## 2023-08-08 DIAGNOSIS — M1990 Unspecified osteoarthritis, unspecified site: Secondary | ICD-10-CM | POA: Diagnosis not present

## 2023-08-08 DIAGNOSIS — I4892 Unspecified atrial flutter: Secondary | ICD-10-CM | POA: Diagnosis not present

## 2023-08-08 DIAGNOSIS — Z952 Presence of prosthetic heart valve: Secondary | ICD-10-CM | POA: Diagnosis not present

## 2023-08-08 DIAGNOSIS — I6522 Occlusion and stenosis of left carotid artery: Secondary | ICD-10-CM | POA: Diagnosis not present

## 2023-08-08 DIAGNOSIS — E7800 Pure hypercholesterolemia, unspecified: Secondary | ICD-10-CM | POA: Diagnosis not present

## 2023-08-08 DIAGNOSIS — N183 Chronic kidney disease, stage 3 unspecified: Secondary | ICD-10-CM | POA: Diagnosis not present

## 2023-08-08 DIAGNOSIS — Z7984 Long term (current) use of oral hypoglycemic drugs: Secondary | ICD-10-CM | POA: Diagnosis not present

## 2023-08-08 DIAGNOSIS — Z9181 History of falling: Secondary | ICD-10-CM | POA: Diagnosis not present

## 2023-08-08 DIAGNOSIS — I5033 Acute on chronic diastolic (congestive) heart failure: Secondary | ICD-10-CM | POA: Diagnosis not present

## 2023-08-08 DIAGNOSIS — E039 Hypothyroidism, unspecified: Secondary | ICD-10-CM | POA: Diagnosis not present

## 2023-08-08 DIAGNOSIS — K219 Gastro-esophageal reflux disease without esophagitis: Secondary | ICD-10-CM | POA: Diagnosis not present

## 2023-08-08 DIAGNOSIS — R42 Dizziness and giddiness: Secondary | ICD-10-CM | POA: Diagnosis not present

## 2023-08-08 DIAGNOSIS — Z86711 Personal history of pulmonary embolism: Secondary | ICD-10-CM | POA: Diagnosis not present

## 2023-08-08 NOTE — Telephone Encounter (Unsigned)
 Copied from CRM [id number]. Topic: Clinical - Home Health Verbal Orders >> Aug 08, 2023  2:25 PM Clyde Darling P wrote: Caller/Agency: wendy physical therapist- [facility] Callback Number: [phone] Service Requested: Physical Therapy Frequency: 1 time a week for 8 weeks Any new concerns about the patient? No

## 2023-08-13 ENCOUNTER — Encounter: Payer: Medicare Other | Admitting: Internal Medicine

## 2023-08-13 DIAGNOSIS — Z7984 Long term (current) use of oral hypoglycemic drugs: Secondary | ICD-10-CM | POA: Diagnosis not present

## 2023-08-13 DIAGNOSIS — Z86711 Personal history of pulmonary embolism: Secondary | ICD-10-CM | POA: Diagnosis not present

## 2023-08-13 DIAGNOSIS — N183 Chronic kidney disease, stage 3 unspecified: Secondary | ICD-10-CM | POA: Diagnosis not present

## 2023-08-13 DIAGNOSIS — I5033 Acute on chronic diastolic (congestive) heart failure: Secondary | ICD-10-CM | POA: Diagnosis not present

## 2023-08-13 DIAGNOSIS — K219 Gastro-esophageal reflux disease without esophagitis: Secondary | ICD-10-CM | POA: Diagnosis not present

## 2023-08-13 DIAGNOSIS — R410 Disorientation, unspecified: Secondary | ICD-10-CM | POA: Diagnosis not present

## 2023-08-13 DIAGNOSIS — E039 Hypothyroidism, unspecified: Secondary | ICD-10-CM | POA: Diagnosis not present

## 2023-08-13 DIAGNOSIS — Z9181 History of falling: Secondary | ICD-10-CM | POA: Diagnosis not present

## 2023-08-13 DIAGNOSIS — E1122 Type 2 diabetes mellitus with diabetic chronic kidney disease: Secondary | ICD-10-CM | POA: Diagnosis not present

## 2023-08-13 DIAGNOSIS — J449 Chronic obstructive pulmonary disease, unspecified: Secondary | ICD-10-CM | POA: Diagnosis not present

## 2023-08-13 DIAGNOSIS — I6522 Occlusion and stenosis of left carotid artery: Secondary | ICD-10-CM | POA: Diagnosis not present

## 2023-08-13 DIAGNOSIS — Z950 Presence of cardiac pacemaker: Secondary | ICD-10-CM | POA: Diagnosis not present

## 2023-08-13 DIAGNOSIS — Z7982 Long term (current) use of aspirin: Secondary | ICD-10-CM | POA: Diagnosis not present

## 2023-08-13 DIAGNOSIS — I480 Paroxysmal atrial fibrillation: Secondary | ICD-10-CM | POA: Diagnosis not present

## 2023-08-13 DIAGNOSIS — I130 Hypertensive heart and chronic kidney disease with heart failure and stage 1 through stage 4 chronic kidney disease, or unspecified chronic kidney disease: Secondary | ICD-10-CM | POA: Diagnosis not present

## 2023-08-13 DIAGNOSIS — M1990 Unspecified osteoarthritis, unspecified site: Secondary | ICD-10-CM | POA: Diagnosis not present

## 2023-08-13 DIAGNOSIS — I4892 Unspecified atrial flutter: Secondary | ICD-10-CM | POA: Diagnosis not present

## 2023-08-13 DIAGNOSIS — Z952 Presence of prosthetic heart valve: Secondary | ICD-10-CM | POA: Diagnosis not present

## 2023-08-13 DIAGNOSIS — E7800 Pure hypercholesterolemia, unspecified: Secondary | ICD-10-CM | POA: Diagnosis not present

## 2023-08-13 DIAGNOSIS — Z8616 Personal history of COVID-19: Secondary | ICD-10-CM | POA: Diagnosis not present

## 2023-08-14 DIAGNOSIS — M1990 Unspecified osteoarthritis, unspecified site: Secondary | ICD-10-CM | POA: Diagnosis not present

## 2023-08-14 DIAGNOSIS — I4892 Unspecified atrial flutter: Secondary | ICD-10-CM | POA: Diagnosis not present

## 2023-08-14 DIAGNOSIS — Z950 Presence of cardiac pacemaker: Secondary | ICD-10-CM | POA: Diagnosis not present

## 2023-08-14 DIAGNOSIS — Z86711 Personal history of pulmonary embolism: Secondary | ICD-10-CM | POA: Diagnosis not present

## 2023-08-14 DIAGNOSIS — Z8616 Personal history of COVID-19: Secondary | ICD-10-CM | POA: Diagnosis not present

## 2023-08-14 DIAGNOSIS — R410 Disorientation, unspecified: Secondary | ICD-10-CM | POA: Diagnosis not present

## 2023-08-14 DIAGNOSIS — E039 Hypothyroidism, unspecified: Secondary | ICD-10-CM | POA: Diagnosis not present

## 2023-08-14 DIAGNOSIS — I130 Hypertensive heart and chronic kidney disease with heart failure and stage 1 through stage 4 chronic kidney disease, or unspecified chronic kidney disease: Secondary | ICD-10-CM | POA: Diagnosis not present

## 2023-08-14 DIAGNOSIS — Z7982 Long term (current) use of aspirin: Secondary | ICD-10-CM | POA: Diagnosis not present

## 2023-08-14 DIAGNOSIS — I480 Paroxysmal atrial fibrillation: Secondary | ICD-10-CM | POA: Diagnosis not present

## 2023-08-14 DIAGNOSIS — K219 Gastro-esophageal reflux disease without esophagitis: Secondary | ICD-10-CM | POA: Diagnosis not present

## 2023-08-14 DIAGNOSIS — I6522 Occlusion and stenosis of left carotid artery: Secondary | ICD-10-CM | POA: Diagnosis not present

## 2023-08-14 DIAGNOSIS — E7800 Pure hypercholesterolemia, unspecified: Secondary | ICD-10-CM | POA: Diagnosis not present

## 2023-08-14 DIAGNOSIS — Z952 Presence of prosthetic heart valve: Secondary | ICD-10-CM | POA: Diagnosis not present

## 2023-08-14 DIAGNOSIS — J449 Chronic obstructive pulmonary disease, unspecified: Secondary | ICD-10-CM | POA: Diagnosis not present

## 2023-08-14 DIAGNOSIS — E1122 Type 2 diabetes mellitus with diabetic chronic kidney disease: Secondary | ICD-10-CM | POA: Diagnosis not present

## 2023-08-14 DIAGNOSIS — I5033 Acute on chronic diastolic (congestive) heart failure: Secondary | ICD-10-CM | POA: Diagnosis not present

## 2023-08-14 DIAGNOSIS — N183 Chronic kidney disease, stage 3 unspecified: Secondary | ICD-10-CM | POA: Diagnosis not present

## 2023-08-14 DIAGNOSIS — Z9181 History of falling: Secondary | ICD-10-CM | POA: Diagnosis not present

## 2023-08-14 DIAGNOSIS — Z7984 Long term (current) use of oral hypoglycemic drugs: Secondary | ICD-10-CM | POA: Diagnosis not present

## 2023-08-15 DIAGNOSIS — E7800 Pure hypercholesterolemia, unspecified: Secondary | ICD-10-CM | POA: Diagnosis not present

## 2023-08-15 DIAGNOSIS — R410 Disorientation, unspecified: Secondary | ICD-10-CM | POA: Diagnosis not present

## 2023-08-15 DIAGNOSIS — N183 Chronic kidney disease, stage 3 unspecified: Secondary | ICD-10-CM | POA: Diagnosis not present

## 2023-08-15 DIAGNOSIS — I4892 Unspecified atrial flutter: Secondary | ICD-10-CM | POA: Diagnosis not present

## 2023-08-15 DIAGNOSIS — J449 Chronic obstructive pulmonary disease, unspecified: Secondary | ICD-10-CM | POA: Diagnosis not present

## 2023-08-15 DIAGNOSIS — Z86711 Personal history of pulmonary embolism: Secondary | ICD-10-CM | POA: Diagnosis not present

## 2023-08-15 DIAGNOSIS — Z950 Presence of cardiac pacemaker: Secondary | ICD-10-CM | POA: Diagnosis not present

## 2023-08-15 DIAGNOSIS — Z7982 Long term (current) use of aspirin: Secondary | ICD-10-CM | POA: Diagnosis not present

## 2023-08-15 DIAGNOSIS — Z7984 Long term (current) use of oral hypoglycemic drugs: Secondary | ICD-10-CM | POA: Diagnosis not present

## 2023-08-15 DIAGNOSIS — I480 Paroxysmal atrial fibrillation: Secondary | ICD-10-CM | POA: Diagnosis not present

## 2023-08-15 DIAGNOSIS — I130 Hypertensive heart and chronic kidney disease with heart failure and stage 1 through stage 4 chronic kidney disease, or unspecified chronic kidney disease: Secondary | ICD-10-CM | POA: Diagnosis not present

## 2023-08-15 DIAGNOSIS — I6522 Occlusion and stenosis of left carotid artery: Secondary | ICD-10-CM | POA: Diagnosis not present

## 2023-08-15 DIAGNOSIS — K219 Gastro-esophageal reflux disease without esophagitis: Secondary | ICD-10-CM | POA: Diagnosis not present

## 2023-08-15 DIAGNOSIS — Z8616 Personal history of COVID-19: Secondary | ICD-10-CM | POA: Diagnosis not present

## 2023-08-15 DIAGNOSIS — E1122 Type 2 diabetes mellitus with diabetic chronic kidney disease: Secondary | ICD-10-CM | POA: Diagnosis not present

## 2023-08-15 DIAGNOSIS — E039 Hypothyroidism, unspecified: Secondary | ICD-10-CM | POA: Diagnosis not present

## 2023-08-15 DIAGNOSIS — Z952 Presence of prosthetic heart valve: Secondary | ICD-10-CM | POA: Diagnosis not present

## 2023-08-15 DIAGNOSIS — M1990 Unspecified osteoarthritis, unspecified site: Secondary | ICD-10-CM | POA: Diagnosis not present

## 2023-08-15 DIAGNOSIS — I5033 Acute on chronic diastolic (congestive) heart failure: Secondary | ICD-10-CM | POA: Diagnosis not present

## 2023-08-15 DIAGNOSIS — Z9181 History of falling: Secondary | ICD-10-CM | POA: Diagnosis not present

## 2023-08-16 ENCOUNTER — Other Ambulatory Visit (INDEPENDENT_AMBULATORY_CARE_PROVIDER_SITE_OTHER): Admitting: Pharmacist

## 2023-08-16 DIAGNOSIS — I130 Hypertensive heart and chronic kidney disease with heart failure and stage 1 through stage 4 chronic kidney disease, or unspecified chronic kidney disease: Secondary | ICD-10-CM | POA: Diagnosis not present

## 2023-08-16 DIAGNOSIS — Z6841 Body Mass Index (BMI) 40.0 and over, adult: Secondary | ICD-10-CM

## 2023-08-16 DIAGNOSIS — Z7984 Long term (current) use of oral hypoglycemic drugs: Secondary | ICD-10-CM

## 2023-08-16 DIAGNOSIS — E118 Type 2 diabetes mellitus with unspecified complications: Secondary | ICD-10-CM

## 2023-08-16 DIAGNOSIS — I4892 Unspecified atrial flutter: Secondary | ICD-10-CM | POA: Diagnosis not present

## 2023-08-16 DIAGNOSIS — F02818 Dementia in other diseases classified elsewhere, unspecified severity, with other behavioral disturbance: Secondary | ICD-10-CM

## 2023-08-16 DIAGNOSIS — N183 Chronic kidney disease, stage 3 unspecified: Secondary | ICD-10-CM | POA: Diagnosis not present

## 2023-08-16 DIAGNOSIS — E039 Hypothyroidism, unspecified: Secondary | ICD-10-CM | POA: Diagnosis not present

## 2023-08-16 DIAGNOSIS — I480 Paroxysmal atrial fibrillation: Secondary | ICD-10-CM | POA: Diagnosis not present

## 2023-08-16 DIAGNOSIS — J449 Chronic obstructive pulmonary disease, unspecified: Secondary | ICD-10-CM | POA: Diagnosis not present

## 2023-08-16 DIAGNOSIS — I5033 Acute on chronic diastolic (congestive) heart failure: Secondary | ICD-10-CM | POA: Diagnosis not present

## 2023-08-16 DIAGNOSIS — R410 Disorientation, unspecified: Secondary | ICD-10-CM | POA: Diagnosis not present

## 2023-08-16 DIAGNOSIS — E1122 Type 2 diabetes mellitus with diabetic chronic kidney disease: Secondary | ICD-10-CM | POA: Diagnosis not present

## 2023-08-16 NOTE — Progress Notes (Addendum)
 08/16/2023 Name: Jessica [name] MRN: [id number] DOB: 14-Dec-1933  Chief Complaint  Patient presents with   Medication Management    Jessica [name] is a 89 y.o. year old female who presented for a telephone visit.   They were referred to the pharmacist by their Case Management Team  for assistance in managing diabetes and medication access.    Subjective:  Care Team: Primary Care Provider: Genia Kettering, MD ; Next Scheduled Visit: 08/13/23  Medication Access/Adherence  Current Pharmacy:  [id number] [place], [place] - [address] AT [facility] OF [address] [place] [address] Phone: [phone] Fax: [phone]  [facility] - [place], [place] - [address] [place]  [address] Phone: [phone] Fax: [phone]  [facility] [place] - [place], [place] - [address] [place] [address] Phone: [phone] Fax: [phone]   Patient reports affordability concerns with their medications: Yes  Patient reports access/transportation concerns to their pharmacy: No  Patient reports adherence concerns with their medications:  Yes       Diabetes:  Current medications: Metformin  500 mg BID, repaglinide  2 mg TID with meals, Jardiance  10 mg daily  Current glucose readings: does not check BG at home  Diet: Notes she drinks all sugar free drinks. She eats cheerios. Unable to tolerate eggs anymore. Does enjoy [place] yogurt as well.  *Was able to get Jardiance  using [employer] grant  Objective:  Lab Results  Component Value Date   HGBA1C 9.2 (H) 05/07/2023    Lab Results  Component Value Date   CREATININE 1.18 05/07/2023   BUN 21 05/07/2023   NA 134 (L) 05/07/2023   K 4.5 05/07/2023   CL 98 05/07/2023   CO2 26 05/07/2023    Lab Results  Component Value Date   CHOL 207 (H) 07/12/2020   HDL 39 (L)  07/12/2020   LDLCALC 119 (H) 07/12/2020   LDLDIRECT 155.9 09/30/2012   TRIG 277 (H) 07/12/2020   CHOLHDL 5.3 (H) 07/12/2020    Medications Reviewed Today     Reviewed by Dion Frankel, RPH (Pharmacist) on 08/17/23 at 1003  Med List Status: <None>   Medication Order Taking? Sig Documenting Provider Last Dose Status Informant  albuterol  (VENTOLIN  HFA) 108 (90 Base) MCG/ACT inhaler [id number]  Inhale 2 puffs into the lungs every 6 (six) hours as needed for wheezing or shortness of breath.  Patient not taking: Reported on 07/25/2023   Gonfa, Taye T, MD  Active Nursing Home Medication Administration Guide (MAG)  allopurinol  (ZYLOPRIM ) 100 MG tablet [id number]  TAKE 1 TABLET BY MOUTH DAILY Plotnikov, Aleksei V, MD  Active   aspirin  EC 81 MG tablet [id number]  Take 162 mg by mouth daily. Swallow whole. [provider]  Active Self  b complex vitamins tablet [id number]  Take 1 tablet by mouth daily. [provider]  Active Nursing Home Medication Administration Guide (MAG)  cefadroxil  (DURICEF) 500 MG capsule [id number]  Take 1 capsule (500 mg total) by mouth 2 (two) times daily. Jamesetta Mcbride, MD  Active   Cholecalciferol  1000 UNITS tablet [id number]  Take 1,000 Units by mouth daily. [provider]  Active Nursing Home Medication Administration Guide (MAG)  CINNAMON PO [id number]  Take 1 capsule by mouth daily. [provider]  Active   empagliflozin  (JARDIANCE ) 10 MG TABS tablet [id number] Yes Take 1 tablet (  10 mg total) by mouth daily. Plotnikov, Aleksei V, MD Taking Active   ezetimibe  (ZETIA ) 10 MG tablet [id number]  TAKE 1 TABLET BY MOUTH DAILY Maudine Sos, MD  Active   furosemide  (LASIX ) 40 MG tablet [id number]  TAKE 1 TABLET BY MOUTH TWICE  DAILY Plotnikov, Aleksei V, MD  Active   levothyroxine  (SYNTHROID ) 75 MCG tablet [id number]  TAKE 1 TABLET BY MOUTH DAILY Plotnikov, Aleksei V, MD  Active   LORazepam  (ATIVAN ) 0.5 MG tablet [id number]  Take 1 tablet (0.5 mg  total) by mouth 2 (two) times daily as needed. for anxiety Plotnikov, Oakley Bellman, MD  Active   metFORMIN  (GLUCOPHAGE ) 500 MG tablet [id number] Yes TAKE 1 TABLET BY MOUTH TWICE  DAILY WITH MEALS Plotnikov, Aleksei V, MD Taking Active   metoprolol  succinate (TOPROL -XL) 50 MG 24 hr tablet [id number]  TAKE 1 TABLET BY MOUTH DAILY  WITH OR IMMEDIATELY FOLLOWING A  MEAL Plotnikov, Aleksei V, MD  Active   Multiple Vitamin (MULTIVITAMIN) tablet [id number]  Take 1 tablet by mouth daily. [provider]  Active Nursing Home Medication Administration Guide (MAG)  nystatin  powder [id number]  Apply 1 Application topically daily. Apply to under breast topically every day shift for redness/moisture. [provider]  Active Nursing Home Medication Administration Guide (MAG)  potassium chloride  SA (KLOR-CON  M) 20 MEQ tablet [id number]  TAKE 1 TABLET BY MOUTH DAILY Plotnikov, Aleksei V, MD  Active   repaglinide  (PRANDIN ) 2 MG tablet [id number] Yes TAKE 2 TABLETS BY MOUTH 3 TIMES  DAILY BEFORE MEALS Plotnikov, Oakley Bellman, MD Taking Active   rosuvastatin  (CRESTOR ) 10 MG tablet [id number]  TAKE 1 TABLET BY MOUTH UP TO 3  TIMES WEEKLY AS TOLERATED Tillery, Michael Andrew, PA-C  Active   sacubitril -valsartan  (ENTRESTO ) 97-103 MG [id number]  Take 1 tablet by mouth 2 (two) times daily. Gonfa, Taye T, MD  Active Nursing Home Medication Administration Guide (MAG)           Med Note Finley Hugh, CHERYL A   Thu Aug 10, 2022  9:52 AM) Has PAP  spironolactone  (ALDACTONE ) 25 MG tablet [id number]  Take 0.5 tablets (12.5 mg total) by mouth daily. Clearnce Curia, NP  Active   temazepam  (RESTORIL ) 15 MG capsule [id number]  TAKE 1 CAPSULE BY MOUTH EVERY NIGHT AT BEDTIME AS NEEDED Plotnikov, Aleksei V, MD  Active   venlafaxine  XR (EFFEXOR -XR) 75 MG 24 hr capsule [id number]  TAKE 1 CAPSULE BY MOUTH DAILY  WITH BREAKFAST Plotnikov, Aleksei V, MD  Active   Med List Note Eugenio Hew, CPhT 04/18/22 1633): [facility] [phone]               Assessment/Plan:   Diabetes: - Currently uncontrolled, A1c goal <8% - Reviewed long term cardiovascular and renal outcomes of uncontrolled blood sugar - Reviewed goal A1c - Reviewed dietary modifications including increased protein/fiber - Continue current regimen. Awaiting A1c, has PCP appt tomorrow, - If remains elevated, will need to check BG at home and consider GLP-1    Follow Up Plan: F/u A1c result  Rainelle Bur, PharmD, BCPS, CPP Clinical Pharmacist Practitioner [facility] at [facility] [phone]  Medical screening examination/treatment/procedure(s) were performed by non-physician practitioner and as supervising physician I was immediately available for consultation/collaboration.  I agree with above. Adelaide Holy, MD

## 2023-08-17 ENCOUNTER — Encounter: Payer: Self-pay | Admitting: Internal Medicine

## 2023-08-17 ENCOUNTER — Ambulatory Visit (INDEPENDENT_AMBULATORY_CARE_PROVIDER_SITE_OTHER): Admitting: Internal Medicine

## 2023-08-17 ENCOUNTER — Encounter: Admitting: Internal Medicine

## 2023-08-17 VITALS — BP 88/58 | HR 75 | Temp 97.6°F | Ht 61.0 in

## 2023-08-17 DIAGNOSIS — I10 Essential (primary) hypertension: Secondary | ICD-10-CM | POA: Diagnosis not present

## 2023-08-17 DIAGNOSIS — R6520 Severe sepsis without septic shock: Secondary | ICD-10-CM | POA: Diagnosis not present

## 2023-08-17 DIAGNOSIS — E118 Type 2 diabetes mellitus with unspecified complications: Secondary | ICD-10-CM

## 2023-08-17 DIAGNOSIS — I5033 Acute on chronic diastolic (congestive) heart failure: Secondary | ICD-10-CM

## 2023-08-17 DIAGNOSIS — Z7984 Long term (current) use of oral hypoglycemic drugs: Secondary | ICD-10-CM | POA: Diagnosis not present

## 2023-08-17 DIAGNOSIS — I872 Venous insufficiency (chronic) (peripheral): Secondary | ICD-10-CM

## 2023-08-17 DIAGNOSIS — A419 Sepsis, unspecified organism: Secondary | ICD-10-CM

## 2023-08-17 DIAGNOSIS — F419 Anxiety disorder, unspecified: Secondary | ICD-10-CM

## 2023-08-17 LAB — CBC WITH DIFFERENTIAL/PLATELET
Basophils Absolute: 0 10*3/uL (ref 0.0–0.1)
Basophils Relative: 0.4 % (ref 0.0–3.0)
Eosinophils Absolute: 0 10*3/uL (ref 0.0–0.7)
Eosinophils Relative: 0.5 % (ref 0.0–5.0)
HCT: 40.6 % (ref 36.0–46.0)
Hemoglobin: 13.5 g/dL (ref 12.0–15.0)
Lymphocytes Relative: 31.6 % (ref 12.0–46.0)
Lymphs Abs: 3.1 10*3/uL (ref 0.7–4.0)
MCHC: 33.3 g/dL (ref 30.0–36.0)
MCV: 90.1 fl (ref 78.0–100.0)
Monocytes Absolute: 1.7 10*3/uL — ABNORMAL HIGH (ref 0.1–1.0)
Monocytes Relative: 17.7 % — ABNORMAL HIGH (ref 3.0–12.0)
Neutro Abs: 4.8 10*3/uL (ref 1.4–7.7)
Neutrophils Relative %: 49.8 % (ref 43.0–77.0)
Platelets: 222 10*3/uL (ref 150.0–400.0)
RBC: 4.51 Mil/uL (ref 3.87–5.11)
RDW: 14.5 % (ref 11.5–15.5)
WBC: 9.7 10*3/uL (ref 4.0–10.5)

## 2023-08-17 LAB — COMPREHENSIVE METABOLIC PANEL WITH GFR
ALT: 34 U/L (ref 0–35)
AST: 36 U/L (ref 0–37)
Albumin: 4.3 g/dL (ref 3.5–5.2)
Alkaline Phosphatase: 80 U/L (ref 39–117)
BUN: 31 mg/dL — ABNORMAL HIGH (ref 6–23)
CO2: 24 meq/L (ref 19–32)
Calcium: 9.9 mg/dL (ref 8.4–10.5)
Chloride: 99 meq/L (ref 96–112)
Creatinine, Ser: 1.6 mg/dL — ABNORMAL HIGH (ref 0.40–1.20)
GFR: 28.74 mL/min — ABNORMAL LOW (ref >60.00)
Glucose, Bld: 251 mg/dL — ABNORMAL HIGH (ref 70–99)
Potassium: 4.5 meq/L (ref 3.5–5.1)
Sodium: 134 meq/L — ABNORMAL LOW (ref 135–145)
Total Bilirubin: 0.5 mg/dL (ref 0.2–1.2)
Total Protein: 8.3 g/dL (ref 6.0–8.3)

## 2023-08-17 LAB — HEMOGLOBIN A1C: Hgb A1c MFr Bld: 9.9 % — ABNORMAL HIGH (ref 4.6–6.5)

## 2023-08-17 NOTE — Assessment & Plan Note (Signed)
 Chronic  ?Continue on lorazepam prn ? Potential benefits of a long term prn benzodiazepines  use as well as potential risks  and complications were explained to the patient and were aknowledged. ?

## 2023-08-17 NOTE — Assessment & Plan Note (Signed)
Chronic Not well controlled Risks associated with diet and treatment noncompliance were discussed. Compliance was encouraged. D/c on Triamt/HCTZ, Losartan Continue on Entresto, Metoprolol

## 2023-08-17 NOTE — Patient Instructions (Signed)
 It was a pleasure speaking with you today!  Continue current regimen. Waiting for A1c update to determine if follow up is needed from pharmacist.  Feel free to call with any questions or concerns!  Rainelle Bur, PharmD, BCPS, CPP Clinical Pharmacist Practitioner [place] [facility] at [facility] [phone]

## 2023-08-17 NOTE — Progress Notes (Signed)
 Subjective:  Patient ID: Jessica [name], female    DOB: [birth year]  Age: 89 y.o. MRN: [id number]  CC: Medical Management of Chronic Issues (3 Month follow up)   HPI Jessica [name] presents for MSSA bacteremia, CHF, DM, OA  Per hx:  "Jessica [name] is a 89 y.o. female here for hospital f/u tavr/pacer infection and mssa bacteremia    Jessica [name] is a 89 y.o. female here for hospital f/u tavr/pacer infection and mssa bacteremia   Patient is supposed to be on 6 weeks cefazolin  until 2/8 and transitioned to cefadroxil  indefinitely She was initially seen 03/29/22 by id Charlee Conine no vegetation on pacer but the tavr has vegetation Ct surgery evaluated and not surgical candidate She was put on rifampin  together with cefazolin  but couldn't tolerate rifampin  due to severe nausea   She was sick with covid and had admission 04/20/22 when rifampin  was stopped"      Outpatient Medications Prior to Visit  Medication Sig Dispense Refill   allopurinol  (ZYLOPRIM ) 100 MG tablet TAKE 1 TABLET BY MOUTH DAILY 100 tablet 2   aspirin  EC 81 MG tablet Take 162 mg by mouth daily. Swallow whole.     b complex vitamins tablet Take 1 tablet by mouth daily.     cefadroxil  (DURICEF) 500 MG capsule Take 1 capsule (500 mg total) by mouth 2 (two) times daily. 60 capsule 11   Cholecalciferol  1000 UNITS tablet Take 1,000 Units by mouth daily.     CINNAMON PO Take 1 capsule by mouth daily.     empagliflozin  (JARDIANCE ) 10 MG TABS tablet Take 1 tablet (10 mg total) by mouth daily. 90 tablet 3   ezetimibe  (ZETIA ) 10 MG tablet TAKE 1 TABLET BY MOUTH DAILY 100 tablet 2   furosemide  (LASIX ) 40 MG tablet TAKE 1 TABLET BY MOUTH TWICE  DAILY 200 tablet 2   levothyroxine  (SYNTHROID ) 75 MCG tablet TAKE 1 TABLET BY MOUTH DAILY 100 tablet 2   LORazepam  (ATIVAN ) 0.5 MG tablet Take 1 tablet (0.5 mg total) by mouth 2 (two) times daily as needed. for anxiety 180 tablet 1   metFORMIN  (GLUCOPHAGE ) 500 MG tablet TAKE 1 TABLET BY  MOUTH TWICE  DAILY WITH MEALS 200 tablet 2   metoprolol  succinate (TOPROL -XL) 50 MG 24 hr tablet TAKE 1 TABLET BY MOUTH DAILY  WITH OR IMMEDIATELY FOLLOWING A  MEAL 100 tablet 2   Multiple Vitamin (MULTIVITAMIN) tablet Take 1 tablet by mouth daily.     nystatin  powder Apply 1 Application topically daily. Apply to under breast topically every day shift for redness/moisture.     potassium chloride  SA (KLOR-CON  M) 20 MEQ tablet TAKE 1 TABLET BY MOUTH DAILY 100 tablet 2   repaglinide  (PRANDIN ) 2 MG tablet TAKE 2 TABLETS BY MOUTH 3 TIMES  DAILY BEFORE MEALS 600 tablet 2   rosuvastatin  (CRESTOR ) 10 MG tablet TAKE 1 TABLET BY MOUTH UP TO 3  TIMES WEEKLY AS TOLERATED 43 tablet 2   sacubitril -valsartan  (ENTRESTO ) 97-103 MG Take 1 tablet by mouth 2 (two) times daily. 180 tablet 3   spironolactone  (ALDACTONE ) 25 MG tablet Take 0.5 tablets (12.5 mg total) by mouth daily. 100 tablet 3   temazepam  (RESTORIL ) 15 MG capsule TAKE 1 CAPSULE BY MOUTH EVERY NIGHT AT BEDTIME AS NEEDED 90 capsule 1   venlafaxine  XR (EFFEXOR -XR) 75 MG 24 hr capsule TAKE 1 CAPSULE BY MOUTH DAILY  WITH BREAKFAST 100 capsule 2   albuterol  (VENTOLIN  HFA) 108 (90 Base) MCG/ACT inhaler Inhale 2  puffs into the lungs every 6 (six) hours as needed for wheezing or shortness of breath. (Patient not taking: Reported on 07/25/2023) 8 g 2   No facility-administered medications prior to visit.    ROS: Review of Systems  Constitutional:  Positive for fatigue. Negative for activity change, appetite change, chills and unexpected weight change.  HENT:  Positive for congestion. Negative for mouth sores and sinus pressure.   Eyes:  Negative for visual disturbance.  Respiratory:  Negative for cough and chest tightness.   Cardiovascular:  Positive for leg swelling.  Gastrointestinal:  Negative for abdominal pain and nausea.  Genitourinary:  Negative for difficulty urinating, frequency and vaginal pain.  Musculoskeletal:  Positive for back pain and gait  problem. Negative for arthralgias.  Skin:  Negative for pallor and rash.  Neurological:  Negative for dizziness, tremors, weakness, numbness and headaches.  Hematological:  Bruises/bleeds easily.  Psychiatric/Behavioral:  Positive for dysphoric mood. Negative for confusion, sleep disturbance and suicidal ideas. The patient is not nervous/anxious.     Objective:  BP (!) 88/58   Pulse 75   Temp 97.6 F (36.4 C)   Ht 5\' 1"  (1.549 m)   SpO2 95%   BMI 44.97 kg/m   BP Readings from Last 3 Encounters:  08/17/23 (!) 88/58  07/25/23 118/67  07/17/23 118/67    Wt Readings from Last 3 Encounters:  07/05/23 238 lb (108 kg)  05/07/23 238 lb (108 kg)  02/28/23 232 lb (105.2 kg)    Physical Exam Constitutional:      General: She is not in acute distress.    Appearance: She is well-developed. She is obese.  HENT:     Head: Normocephalic.     Right Ear: External ear normal.     Left Ear: External ear normal.     Nose: Nose normal.     Mouth/Throat:     Pharynx: No oropharyngeal exudate.  Eyes:     General:        Right eye: No discharge.        Left eye: No discharge.     Conjunctiva/sclera: Conjunctivae normal.     Pupils: Pupils are equal, round, and reactive to light.  Neck:     Thyroid : No thyromegaly.     Vascular: No JVD.     Trachea: No tracheal deviation.  Cardiovascular:     Rate and Rhythm: Normal rate and regular rhythm.     Heart sounds: Normal heart sounds.  Pulmonary:     Effort: No respiratory distress.     Breath sounds: No stridor. No wheezing.  Abdominal:     General: Bowel sounds are normal. There is no distension.     Palpations: Abdomen is soft. There is no mass.     Tenderness: There is no abdominal tenderness. There is no guarding or rebound.  Musculoskeletal:        General: Tenderness present.     Cervical back: Normal range of motion and neck supple. No rigidity.     Right lower leg: Edema present.     Left lower leg: Edema present.   Lymphadenopathy:     Cervical: No cervical adenopathy.  Skin:    Findings: No erythema or rash.  Neurological:     Mental Status: She is oriented to person, place, and time.     Cranial Nerves: No cranial nerve deficit.     Motor: No abnormal muscle tone.     Coordination: Coordination normal.     Gait:  Gait abnormal.     Deep Tendon Reflexes: Reflexes normal.  Psychiatric:        Behavior: Behavior normal.        Thought Content: Thought content normal.        Judgment: Judgment normal.   In a w/c  Lab Results  Component Value Date   WBC 10.4 02/26/2023   HGB 13.8 02/26/2023   HCT 41.7 02/26/2023   PLT 184.0 02/26/2023   GLUCOSE 117 (H) 05/07/2023   CHOL 207 (H) 07/12/2020   TRIG 277 (H) 07/12/2020   HDL 39 (L) 07/12/2020   LDLDIRECT 155.9 09/30/2012   LDLCALC 119 (H) 07/12/2020   ALT 25 05/07/2023   AST 30 05/07/2023   NA 134 (L) 05/07/2023   K 4.5 05/07/2023   CL 98 05/07/2023   CREATININE 1.18 05/07/2023   BUN 21 05/07/2023   CO2 26 05/07/2023   TSH 2.92 05/07/2023   INR 3.9 (H) 04/25/2022   HGBA1C 9.2 (H) 05/07/2023   MICROALBUR 4.5 (H) 12/02/2009    DG Hip Unilat With Pelvis 2-3 Views Left Result Date: 05/14/2022 CLINICAL DATA:  Cough, weakness, hip pain EXAM: DG HIP (WITH OR WITHOUT PELVIS) 2-3V LEFT COMPARISON:  None Available. FINDINGS: No acute fracture or dislocation. No aggressive osseous lesion. Normal alignment. Mild osteoarthritis of the left hip. Generalized osteopenia. Soft tissue are unremarkable. No radiopaque foreign body or soft tissue emphysema. IMPRESSION: 1. Mild osteoarthritis of the left hip. Given the patient's age and osteopenia, if there is persistent clinical concern for an occult hip fracture, a MRI of the hip is recommended for increased sensitivity. Electronically Signed   By: Onnie Bilis M.D.   On: 05/14/2022 09:35   DG Chest Port 1 View Result Date: 05/14/2022 CLINICAL DATA:  Cough, pain EXAM: PORTABLE CHEST 1 VIEW COMPARISON:   04/25/2022 FINDINGS: Bilateral interstitial thickening. Small bilateral pleural effusions. Bibasilar atelectasis. No pneumothorax. Stable cardiomegaly. Prior TAVR. Dual lead cardiac pacemaker. No acute osseous abnormality. IMPRESSION: 1. Findings concerning for CHF. Electronically Signed   By: Onnie Bilis M.D.   On: 05/14/2022 09:34    Assessment & Plan:   Problem List Items Addressed This Visit     DM type 2, controlled, with complication (HCC) - Primary    She can continue with repaglinide  and Jardiance .  We can add  Actos - she is to let me know if she is willing to start using insulin . Check A1c      Relevant Orders   CBC with Differential/Platelet   Comprehensive metabolic panel with GFR   Hemoglobin A1c   TSH   Anxiety disorder   Chronic  Continue on lorazepam  prn  Potential benefits of a long term prn benzodiazepines  use as well as potential risks  and complications were explained to the patient and were aknowledged.      Essential hypertension   Chronic Not well controlled Risks associated with diet and treatment noncompliance were discussed. Compliance was encouraged. D/c on Triamt/HCTZ, Losartan  Continue on Entresto , Metoprolol       Relevant Orders   CBC with Differential/Platelet   Comprehensive metabolic panel with GFR   Hemoglobin A1c   TSH   RESOLVED: Stasis dermatitis of both legs   Edema is better Use lotion on skin      Acute on chronic diastolic CHF (congestive heart failure) (HCC)   Continue on Entresto , metoprolol , spironolactone , Jardiance .      Severe sepsis with acute organ dysfunction (HCC)   S/p tavr/pacer infection and  mssa bacteremia 2025  On Duricef 500 mg bid         No orders of the defined types were placed in this encounter.     Follow-up: No follow-ups on file.  Anitra Barn, MD

## 2023-08-17 NOTE — Assessment & Plan Note (Signed)
 Edema is better Use lotion on skin

## 2023-08-17 NOTE — Assessment & Plan Note (Signed)
 S/p tavr/pacer infection and mssa bacteremia 2025  On Duricef 500 mg bid

## 2023-08-17 NOTE — Assessment & Plan Note (Addendum)
 Continue on Entresto , metoprolol , spironolactone , Jardiance .

## 2023-08-17 NOTE — Assessment & Plan Note (Signed)
 She can continue with repaglinide and Jardiance.  We can add  Actos - she is to let me know if she is willing to start using insulin. Check A1c

## 2023-08-20 DIAGNOSIS — J449 Chronic obstructive pulmonary disease, unspecified: Secondary | ICD-10-CM | POA: Diagnosis not present

## 2023-08-20 DIAGNOSIS — I480 Paroxysmal atrial fibrillation: Secondary | ICD-10-CM | POA: Diagnosis not present

## 2023-08-20 DIAGNOSIS — Z8616 Personal history of COVID-19: Secondary | ICD-10-CM | POA: Diagnosis not present

## 2023-08-20 DIAGNOSIS — E039 Hypothyroidism, unspecified: Secondary | ICD-10-CM | POA: Diagnosis not present

## 2023-08-20 DIAGNOSIS — Z950 Presence of cardiac pacemaker: Secondary | ICD-10-CM | POA: Diagnosis not present

## 2023-08-20 DIAGNOSIS — E7800 Pure hypercholesterolemia, unspecified: Secondary | ICD-10-CM | POA: Diagnosis not present

## 2023-08-20 DIAGNOSIS — I130 Hypertensive heart and chronic kidney disease with heart failure and stage 1 through stage 4 chronic kidney disease, or unspecified chronic kidney disease: Secondary | ICD-10-CM | POA: Diagnosis not present

## 2023-08-20 DIAGNOSIS — R410 Disorientation, unspecified: Secondary | ICD-10-CM | POA: Diagnosis not present

## 2023-08-20 DIAGNOSIS — I5033 Acute on chronic diastolic (congestive) heart failure: Secondary | ICD-10-CM | POA: Diagnosis not present

## 2023-08-20 DIAGNOSIS — N183 Chronic kidney disease, stage 3 unspecified: Secondary | ICD-10-CM | POA: Diagnosis not present

## 2023-08-20 DIAGNOSIS — M1990 Unspecified osteoarthritis, unspecified site: Secondary | ICD-10-CM | POA: Diagnosis not present

## 2023-08-20 DIAGNOSIS — Z86711 Personal history of pulmonary embolism: Secondary | ICD-10-CM | POA: Diagnosis not present

## 2023-08-20 DIAGNOSIS — E1122 Type 2 diabetes mellitus with diabetic chronic kidney disease: Secondary | ICD-10-CM | POA: Diagnosis not present

## 2023-08-20 DIAGNOSIS — Z7984 Long term (current) use of oral hypoglycemic drugs: Secondary | ICD-10-CM | POA: Diagnosis not present

## 2023-08-20 DIAGNOSIS — Z9181 History of falling: Secondary | ICD-10-CM | POA: Diagnosis not present

## 2023-08-20 DIAGNOSIS — Z7982 Long term (current) use of aspirin: Secondary | ICD-10-CM | POA: Diagnosis not present

## 2023-08-20 DIAGNOSIS — K219 Gastro-esophageal reflux disease without esophagitis: Secondary | ICD-10-CM | POA: Diagnosis not present

## 2023-08-20 DIAGNOSIS — I4892 Unspecified atrial flutter: Secondary | ICD-10-CM | POA: Diagnosis not present

## 2023-08-20 DIAGNOSIS — Z952 Presence of prosthetic heart valve: Secondary | ICD-10-CM | POA: Diagnosis not present

## 2023-08-20 DIAGNOSIS — I6522 Occlusion and stenosis of left carotid artery: Secondary | ICD-10-CM | POA: Diagnosis not present

## 2023-08-21 ENCOUNTER — Ambulatory Visit (INDEPENDENT_AMBULATORY_CARE_PROVIDER_SITE_OTHER): Payer: Medicare Other

## 2023-08-21 DIAGNOSIS — I4892 Unspecified atrial flutter: Secondary | ICD-10-CM | POA: Diagnosis not present

## 2023-08-21 DIAGNOSIS — Z7982 Long term (current) use of aspirin: Secondary | ICD-10-CM | POA: Diagnosis not present

## 2023-08-21 DIAGNOSIS — Z952 Presence of prosthetic heart valve: Secondary | ICD-10-CM | POA: Diagnosis not present

## 2023-08-21 DIAGNOSIS — I442 Atrioventricular block, complete: Secondary | ICD-10-CM | POA: Diagnosis not present

## 2023-08-21 DIAGNOSIS — R410 Disorientation, unspecified: Secondary | ICD-10-CM | POA: Diagnosis not present

## 2023-08-21 DIAGNOSIS — Z8616 Personal history of COVID-19: Secondary | ICD-10-CM | POA: Diagnosis not present

## 2023-08-21 DIAGNOSIS — E7800 Pure hypercholesterolemia, unspecified: Secondary | ICD-10-CM | POA: Diagnosis not present

## 2023-08-21 DIAGNOSIS — N183 Chronic kidney disease, stage 3 unspecified: Secondary | ICD-10-CM | POA: Diagnosis not present

## 2023-08-21 DIAGNOSIS — I6522 Occlusion and stenosis of left carotid artery: Secondary | ICD-10-CM | POA: Diagnosis not present

## 2023-08-21 DIAGNOSIS — E039 Hypothyroidism, unspecified: Secondary | ICD-10-CM | POA: Diagnosis not present

## 2023-08-21 DIAGNOSIS — E1122 Type 2 diabetes mellitus with diabetic chronic kidney disease: Secondary | ICD-10-CM | POA: Diagnosis not present

## 2023-08-21 DIAGNOSIS — Z86711 Personal history of pulmonary embolism: Secondary | ICD-10-CM | POA: Diagnosis not present

## 2023-08-21 DIAGNOSIS — I5033 Acute on chronic diastolic (congestive) heart failure: Secondary | ICD-10-CM | POA: Diagnosis not present

## 2023-08-21 DIAGNOSIS — I130 Hypertensive heart and chronic kidney disease with heart failure and stage 1 through stage 4 chronic kidney disease, or unspecified chronic kidney disease: Secondary | ICD-10-CM | POA: Diagnosis not present

## 2023-08-21 DIAGNOSIS — M1990 Unspecified osteoarthritis, unspecified site: Secondary | ICD-10-CM | POA: Diagnosis not present

## 2023-08-21 DIAGNOSIS — Z9181 History of falling: Secondary | ICD-10-CM | POA: Diagnosis not present

## 2023-08-21 DIAGNOSIS — Z7984 Long term (current) use of oral hypoglycemic drugs: Secondary | ICD-10-CM | POA: Diagnosis not present

## 2023-08-21 DIAGNOSIS — K219 Gastro-esophageal reflux disease without esophagitis: Secondary | ICD-10-CM | POA: Diagnosis not present

## 2023-08-21 DIAGNOSIS — Z950 Presence of cardiac pacemaker: Secondary | ICD-10-CM | POA: Diagnosis not present

## 2023-08-21 DIAGNOSIS — J449 Chronic obstructive pulmonary disease, unspecified: Secondary | ICD-10-CM | POA: Diagnosis not present

## 2023-08-21 DIAGNOSIS — I480 Paroxysmal atrial fibrillation: Secondary | ICD-10-CM | POA: Diagnosis not present

## 2023-08-21 LAB — TSH: TSH: 2.41 u[IU]/mL (ref 0.35–5.50)

## 2023-08-22 ENCOUNTER — Ambulatory Visit: Payer: Self-pay | Admitting: Internal Medicine

## 2023-08-23 ENCOUNTER — Ambulatory Visit: Payer: Self-pay

## 2023-08-23 ENCOUNTER — Telehealth: Payer: Self-pay | Admitting: Internal Medicine

## 2023-08-23 NOTE — Telephone Encounter (Signed)
 Attempted to call but had to leave a voice message for call back

## 2023-08-23 NOTE — Telephone Encounter (Signed)
 Copied from CRM [id number]. Topic: Clinical - Lab/Test Results >> Aug 23, 2023  8:41 AM Jessica [name] wrote: Reason for CRM: Patient would like for someone to give a call to go over lab results with her. Please call [phone]

## 2023-08-23 NOTE — Telephone Encounter (Signed)
 Copied from CRM [id number]. Topic: Clinical - Lab/Test Results >> Aug 23, 2023  5:02 PM [place] J wrote: Reason for CRM: Patient calling in for lab results and also wanted to know a sugar reading from lab.  Patient calling for recent lab results. I have reviewed the results with her and have answered her questions. 3 month follow up appointment scheduled per her request.    Reason for Disposition  Caller requesting routine or non-urgent lab result  Answer Assessment - Initial Assessment Questions 1. REASON FOR CALL or QUESTION: "What is your reason for calling today?" or "How can I best help you?" or "What question do you have that I can help answer?"     Lab results  2. CALLER: Document the source of call. (e.g., laboratory, patient).     Patient  Protocols used: PCP Call - No Triage-A-AH

## 2023-08-28 DIAGNOSIS — I130 Hypertensive heart and chronic kidney disease with heart failure and stage 1 through stage 4 chronic kidney disease, or unspecified chronic kidney disease: Secondary | ICD-10-CM | POA: Diagnosis not present

## 2023-08-28 DIAGNOSIS — I6522 Occlusion and stenosis of left carotid artery: Secondary | ICD-10-CM | POA: Diagnosis not present

## 2023-08-28 DIAGNOSIS — Z86711 Personal history of pulmonary embolism: Secondary | ICD-10-CM | POA: Diagnosis not present

## 2023-08-28 DIAGNOSIS — E1122 Type 2 diabetes mellitus with diabetic chronic kidney disease: Secondary | ICD-10-CM | POA: Diagnosis not present

## 2023-08-28 DIAGNOSIS — E039 Hypothyroidism, unspecified: Secondary | ICD-10-CM | POA: Diagnosis not present

## 2023-08-28 DIAGNOSIS — N183 Chronic kidney disease, stage 3 unspecified: Secondary | ICD-10-CM | POA: Diagnosis not present

## 2023-08-28 DIAGNOSIS — Z9181 History of falling: Secondary | ICD-10-CM | POA: Diagnosis not present

## 2023-08-28 DIAGNOSIS — I480 Paroxysmal atrial fibrillation: Secondary | ICD-10-CM | POA: Diagnosis not present

## 2023-08-28 DIAGNOSIS — Z8616 Personal history of COVID-19: Secondary | ICD-10-CM | POA: Diagnosis not present

## 2023-08-28 DIAGNOSIS — I4892 Unspecified atrial flutter: Secondary | ICD-10-CM | POA: Diagnosis not present

## 2023-08-28 DIAGNOSIS — Z7984 Long term (current) use of oral hypoglycemic drugs: Secondary | ICD-10-CM | POA: Diagnosis not present

## 2023-08-28 DIAGNOSIS — R410 Disorientation, unspecified: Secondary | ICD-10-CM | POA: Diagnosis not present

## 2023-08-28 DIAGNOSIS — I5033 Acute on chronic diastolic (congestive) heart failure: Secondary | ICD-10-CM | POA: Diagnosis not present

## 2023-08-28 DIAGNOSIS — E7800 Pure hypercholesterolemia, unspecified: Secondary | ICD-10-CM | POA: Diagnosis not present

## 2023-08-28 DIAGNOSIS — Z950 Presence of cardiac pacemaker: Secondary | ICD-10-CM | POA: Diagnosis not present

## 2023-08-28 DIAGNOSIS — M1990 Unspecified osteoarthritis, unspecified site: Secondary | ICD-10-CM | POA: Diagnosis not present

## 2023-08-28 DIAGNOSIS — K219 Gastro-esophageal reflux disease without esophagitis: Secondary | ICD-10-CM | POA: Diagnosis not present

## 2023-08-28 DIAGNOSIS — J449 Chronic obstructive pulmonary disease, unspecified: Secondary | ICD-10-CM | POA: Diagnosis not present

## 2023-08-28 DIAGNOSIS — Z952 Presence of prosthetic heart valve: Secondary | ICD-10-CM | POA: Diagnosis not present

## 2023-08-28 DIAGNOSIS — Z7982 Long term (current) use of aspirin: Secondary | ICD-10-CM | POA: Diagnosis not present

## 2023-08-29 ENCOUNTER — Telehealth: Payer: Self-pay

## 2023-08-29 ENCOUNTER — Ambulatory Visit: Payer: Self-pay | Admitting: Cardiology

## 2023-08-29 DIAGNOSIS — Z952 Presence of prosthetic heart valve: Secondary | ICD-10-CM | POA: Diagnosis not present

## 2023-08-29 DIAGNOSIS — I6522 Occlusion and stenosis of left carotid artery: Secondary | ICD-10-CM | POA: Diagnosis not present

## 2023-08-29 DIAGNOSIS — I130 Hypertensive heart and chronic kidney disease with heart failure and stage 1 through stage 4 chronic kidney disease, or unspecified chronic kidney disease: Secondary | ICD-10-CM | POA: Diagnosis not present

## 2023-08-29 DIAGNOSIS — E039 Hypothyroidism, unspecified: Secondary | ICD-10-CM | POA: Diagnosis not present

## 2023-08-29 DIAGNOSIS — Z950 Presence of cardiac pacemaker: Secondary | ICD-10-CM | POA: Diagnosis not present

## 2023-08-29 DIAGNOSIS — I4892 Unspecified atrial flutter: Secondary | ICD-10-CM | POA: Diagnosis not present

## 2023-08-29 DIAGNOSIS — E7800 Pure hypercholesterolemia, unspecified: Secondary | ICD-10-CM | POA: Diagnosis not present

## 2023-08-29 DIAGNOSIS — E1122 Type 2 diabetes mellitus with diabetic chronic kidney disease: Secondary | ICD-10-CM | POA: Diagnosis not present

## 2023-08-29 DIAGNOSIS — M1990 Unspecified osteoarthritis, unspecified site: Secondary | ICD-10-CM | POA: Diagnosis not present

## 2023-08-29 DIAGNOSIS — Z9181 History of falling: Secondary | ICD-10-CM | POA: Diagnosis not present

## 2023-08-29 DIAGNOSIS — I5033 Acute on chronic diastolic (congestive) heart failure: Secondary | ICD-10-CM | POA: Diagnosis not present

## 2023-08-29 DIAGNOSIS — Z7984 Long term (current) use of oral hypoglycemic drugs: Secondary | ICD-10-CM | POA: Diagnosis not present

## 2023-08-29 DIAGNOSIS — I480 Paroxysmal atrial fibrillation: Secondary | ICD-10-CM | POA: Diagnosis not present

## 2023-08-29 DIAGNOSIS — K219 Gastro-esophageal reflux disease without esophagitis: Secondary | ICD-10-CM | POA: Diagnosis not present

## 2023-08-29 DIAGNOSIS — R410 Disorientation, unspecified: Secondary | ICD-10-CM | POA: Diagnosis not present

## 2023-08-29 DIAGNOSIS — Z86711 Personal history of pulmonary embolism: Secondary | ICD-10-CM | POA: Diagnosis not present

## 2023-08-29 DIAGNOSIS — Z8616 Personal history of COVID-19: Secondary | ICD-10-CM | POA: Diagnosis not present

## 2023-08-29 DIAGNOSIS — J449 Chronic obstructive pulmonary disease, unspecified: Secondary | ICD-10-CM | POA: Diagnosis not present

## 2023-08-29 DIAGNOSIS — N183 Chronic kidney disease, stage 3 unspecified: Secondary | ICD-10-CM | POA: Diagnosis not present

## 2023-08-29 DIAGNOSIS — Z7982 Long term (current) use of aspirin: Secondary | ICD-10-CM | POA: Diagnosis not present

## 2023-08-29 LAB — CUP PACEART REMOTE DEVICE CHECK
Battery Impedance: 2050 Ohm
Battery Remaining Longevity: 34 mo
Battery Voltage: 2.76 V
Brady Statistic AP VP Percent: 9 %
Brady Statistic AP VS Percent: 0 %
Brady Statistic AS VP Percent: 90 %
Brady Statistic AS VS Percent: 1 %
Date Time Interrogation Session: 20250522120240
Implantable Lead Connection Status: 753985
Implantable Lead Connection Status: 753985
Implantable Lead Implant Date: 20140710
Implantable Lead Implant Date: 20140710
Implantable Lead Location: 753859
Implantable Lead Location: 753860
Implantable Lead Model: 5092
Implantable Lead Model: 5592
Implantable Pulse Generator Implant Date: 20140710
Lead Channel Impedance Value: 532 Ohm
Lead Channel Impedance Value: 799 Ohm
Lead Channel Pacing Threshold Amplitude: 0.5 V
Lead Channel Pacing Threshold Amplitude: 0.625 V
Lead Channel Pacing Threshold Pulse Width: 0.4 ms
Lead Channel Pacing Threshold Pulse Width: 0.4 ms
Lead Channel Setting Pacing Amplitude: 2 V
Lead Channel Setting Pacing Amplitude: 2.5 V
Lead Channel Setting Pacing Pulse Width: 0.4 ms
Lead Channel Setting Sensing Sensitivity: 4 mV
Zone Setting Status: 755011
Zone Setting Status: 755011

## 2023-08-29 NOTE — Telephone Encounter (Signed)
 Patient questions have been answered by triage nurse

## 2023-08-31 ENCOUNTER — Ambulatory Visit (HOSPITAL_BASED_OUTPATIENT_CLINIC_OR_DEPARTMENT_OTHER): Payer: Medicare Other | Admitting: Family

## 2023-09-01 ENCOUNTER — Other Ambulatory Visit (HOSPITAL_BASED_OUTPATIENT_CLINIC_OR_DEPARTMENT_OTHER): Payer: Self-pay | Admitting: Family

## 2023-09-01 ENCOUNTER — Other Ambulatory Visit: Payer: Self-pay | Admitting: Internal Medicine

## 2023-09-01 DIAGNOSIS — I5032 Chronic diastolic (congestive) heart failure: Secondary | ICD-10-CM

## 2023-09-03 DIAGNOSIS — I6522 Occlusion and stenosis of left carotid artery: Secondary | ICD-10-CM | POA: Diagnosis not present

## 2023-09-03 DIAGNOSIS — N183 Chronic kidney disease, stage 3 unspecified: Secondary | ICD-10-CM | POA: Diagnosis not present

## 2023-09-03 DIAGNOSIS — Z8616 Personal history of COVID-19: Secondary | ICD-10-CM | POA: Diagnosis not present

## 2023-09-03 DIAGNOSIS — I4892 Unspecified atrial flutter: Secondary | ICD-10-CM | POA: Diagnosis not present

## 2023-09-03 DIAGNOSIS — I130 Hypertensive heart and chronic kidney disease with heart failure and stage 1 through stage 4 chronic kidney disease, or unspecified chronic kidney disease: Secondary | ICD-10-CM | POA: Diagnosis not present

## 2023-09-03 DIAGNOSIS — E7800 Pure hypercholesterolemia, unspecified: Secondary | ICD-10-CM | POA: Diagnosis not present

## 2023-09-03 DIAGNOSIS — J449 Chronic obstructive pulmonary disease, unspecified: Secondary | ICD-10-CM | POA: Diagnosis not present

## 2023-09-03 DIAGNOSIS — Z950 Presence of cardiac pacemaker: Secondary | ICD-10-CM | POA: Diagnosis not present

## 2023-09-03 DIAGNOSIS — Z86711 Personal history of pulmonary embolism: Secondary | ICD-10-CM | POA: Diagnosis not present

## 2023-09-03 DIAGNOSIS — Z952 Presence of prosthetic heart valve: Secondary | ICD-10-CM | POA: Diagnosis not present

## 2023-09-03 DIAGNOSIS — I5033 Acute on chronic diastolic (congestive) heart failure: Secondary | ICD-10-CM | POA: Diagnosis not present

## 2023-09-03 DIAGNOSIS — Z7984 Long term (current) use of oral hypoglycemic drugs: Secondary | ICD-10-CM | POA: Diagnosis not present

## 2023-09-03 DIAGNOSIS — Z9181 History of falling: Secondary | ICD-10-CM | POA: Diagnosis not present

## 2023-09-03 DIAGNOSIS — E1122 Type 2 diabetes mellitus with diabetic chronic kidney disease: Secondary | ICD-10-CM | POA: Diagnosis not present

## 2023-09-03 DIAGNOSIS — K219 Gastro-esophageal reflux disease without esophagitis: Secondary | ICD-10-CM | POA: Diagnosis not present

## 2023-09-03 DIAGNOSIS — Z7982 Long term (current) use of aspirin: Secondary | ICD-10-CM | POA: Diagnosis not present

## 2023-09-03 DIAGNOSIS — R410 Disorientation, unspecified: Secondary | ICD-10-CM | POA: Diagnosis not present

## 2023-09-03 DIAGNOSIS — I480 Paroxysmal atrial fibrillation: Secondary | ICD-10-CM | POA: Diagnosis not present

## 2023-09-03 DIAGNOSIS — E039 Hypothyroidism, unspecified: Secondary | ICD-10-CM | POA: Diagnosis not present

## 2023-09-03 DIAGNOSIS — M1990 Unspecified osteoarthritis, unspecified site: Secondary | ICD-10-CM | POA: Diagnosis not present

## 2023-09-04 DIAGNOSIS — Z9181 History of falling: Secondary | ICD-10-CM | POA: Diagnosis not present

## 2023-09-04 DIAGNOSIS — M1990 Unspecified osteoarthritis, unspecified site: Secondary | ICD-10-CM | POA: Diagnosis not present

## 2023-09-04 DIAGNOSIS — I130 Hypertensive heart and chronic kidney disease with heart failure and stage 1 through stage 4 chronic kidney disease, or unspecified chronic kidney disease: Secondary | ICD-10-CM | POA: Diagnosis not present

## 2023-09-04 DIAGNOSIS — Z7984 Long term (current) use of oral hypoglycemic drugs: Secondary | ICD-10-CM | POA: Diagnosis not present

## 2023-09-04 DIAGNOSIS — R410 Disorientation, unspecified: Secondary | ICD-10-CM | POA: Diagnosis not present

## 2023-09-04 DIAGNOSIS — E7800 Pure hypercholesterolemia, unspecified: Secondary | ICD-10-CM | POA: Diagnosis not present

## 2023-09-04 DIAGNOSIS — E1122 Type 2 diabetes mellitus with diabetic chronic kidney disease: Secondary | ICD-10-CM | POA: Diagnosis not present

## 2023-09-04 DIAGNOSIS — N183 Chronic kidney disease, stage 3 unspecified: Secondary | ICD-10-CM | POA: Diagnosis not present

## 2023-09-04 DIAGNOSIS — J449 Chronic obstructive pulmonary disease, unspecified: Secondary | ICD-10-CM | POA: Diagnosis not present

## 2023-09-04 DIAGNOSIS — I5033 Acute on chronic diastolic (congestive) heart failure: Secondary | ICD-10-CM | POA: Diagnosis not present

## 2023-09-04 DIAGNOSIS — I480 Paroxysmal atrial fibrillation: Secondary | ICD-10-CM | POA: Diagnosis not present

## 2023-09-04 DIAGNOSIS — I4892 Unspecified atrial flutter: Secondary | ICD-10-CM | POA: Diagnosis not present

## 2023-09-04 DIAGNOSIS — Z86711 Personal history of pulmonary embolism: Secondary | ICD-10-CM | POA: Diagnosis not present

## 2023-09-04 DIAGNOSIS — K219 Gastro-esophageal reflux disease without esophagitis: Secondary | ICD-10-CM | POA: Diagnosis not present

## 2023-09-04 DIAGNOSIS — Z952 Presence of prosthetic heart valve: Secondary | ICD-10-CM | POA: Diagnosis not present

## 2023-09-04 DIAGNOSIS — E039 Hypothyroidism, unspecified: Secondary | ICD-10-CM | POA: Diagnosis not present

## 2023-09-04 DIAGNOSIS — Z7982 Long term (current) use of aspirin: Secondary | ICD-10-CM | POA: Diagnosis not present

## 2023-09-04 DIAGNOSIS — Z950 Presence of cardiac pacemaker: Secondary | ICD-10-CM | POA: Diagnosis not present

## 2023-09-04 DIAGNOSIS — I6522 Occlusion and stenosis of left carotid artery: Secondary | ICD-10-CM | POA: Diagnosis not present

## 2023-09-10 DIAGNOSIS — I480 Paroxysmal atrial fibrillation: Secondary | ICD-10-CM | POA: Diagnosis not present

## 2023-09-10 DIAGNOSIS — J449 Chronic obstructive pulmonary disease, unspecified: Secondary | ICD-10-CM | POA: Diagnosis not present

## 2023-09-10 DIAGNOSIS — I6522 Occlusion and stenosis of left carotid artery: Secondary | ICD-10-CM | POA: Diagnosis not present

## 2023-09-10 DIAGNOSIS — Z86711 Personal history of pulmonary embolism: Secondary | ICD-10-CM | POA: Diagnosis not present

## 2023-09-10 DIAGNOSIS — Z950 Presence of cardiac pacemaker: Secondary | ICD-10-CM | POA: Diagnosis not present

## 2023-09-10 DIAGNOSIS — R410 Disorientation, unspecified: Secondary | ICD-10-CM | POA: Diagnosis not present

## 2023-09-10 DIAGNOSIS — M1990 Unspecified osteoarthritis, unspecified site: Secondary | ICD-10-CM | POA: Diagnosis not present

## 2023-09-10 DIAGNOSIS — K219 Gastro-esophageal reflux disease without esophagitis: Secondary | ICD-10-CM | POA: Diagnosis not present

## 2023-09-10 DIAGNOSIS — Z7984 Long term (current) use of oral hypoglycemic drugs: Secondary | ICD-10-CM | POA: Diagnosis not present

## 2023-09-10 DIAGNOSIS — I5033 Acute on chronic diastolic (congestive) heart failure: Secondary | ICD-10-CM | POA: Diagnosis not present

## 2023-09-10 DIAGNOSIS — Z9181 History of falling: Secondary | ICD-10-CM | POA: Diagnosis not present

## 2023-09-10 DIAGNOSIS — Z952 Presence of prosthetic heart valve: Secondary | ICD-10-CM | POA: Diagnosis not present

## 2023-09-10 DIAGNOSIS — E1122 Type 2 diabetes mellitus with diabetic chronic kidney disease: Secondary | ICD-10-CM | POA: Diagnosis not present

## 2023-09-10 DIAGNOSIS — I130 Hypertensive heart and chronic kidney disease with heart failure and stage 1 through stage 4 chronic kidney disease, or unspecified chronic kidney disease: Secondary | ICD-10-CM | POA: Diagnosis not present

## 2023-09-10 DIAGNOSIS — E039 Hypothyroidism, unspecified: Secondary | ICD-10-CM | POA: Diagnosis not present

## 2023-09-10 DIAGNOSIS — Z7982 Long term (current) use of aspirin: Secondary | ICD-10-CM | POA: Diagnosis not present

## 2023-09-10 DIAGNOSIS — N183 Chronic kidney disease, stage 3 unspecified: Secondary | ICD-10-CM | POA: Diagnosis not present

## 2023-09-10 DIAGNOSIS — E7800 Pure hypercholesterolemia, unspecified: Secondary | ICD-10-CM | POA: Diagnosis not present

## 2023-09-10 DIAGNOSIS — I4892 Unspecified atrial flutter: Secondary | ICD-10-CM | POA: Diagnosis not present

## 2023-09-11 DIAGNOSIS — Z952 Presence of prosthetic heart valve: Secondary | ICD-10-CM | POA: Diagnosis not present

## 2023-09-11 DIAGNOSIS — M1990 Unspecified osteoarthritis, unspecified site: Secondary | ICD-10-CM | POA: Diagnosis not present

## 2023-09-11 DIAGNOSIS — I130 Hypertensive heart and chronic kidney disease with heart failure and stage 1 through stage 4 chronic kidney disease, or unspecified chronic kidney disease: Secondary | ICD-10-CM | POA: Diagnosis not present

## 2023-09-11 DIAGNOSIS — Z950 Presence of cardiac pacemaker: Secondary | ICD-10-CM | POA: Diagnosis not present

## 2023-09-11 DIAGNOSIS — I480 Paroxysmal atrial fibrillation: Secondary | ICD-10-CM | POA: Diagnosis not present

## 2023-09-11 DIAGNOSIS — N183 Chronic kidney disease, stage 3 unspecified: Secondary | ICD-10-CM | POA: Diagnosis not present

## 2023-09-11 DIAGNOSIS — R410 Disorientation, unspecified: Secondary | ICD-10-CM | POA: Diagnosis not present

## 2023-09-11 DIAGNOSIS — I4892 Unspecified atrial flutter: Secondary | ICD-10-CM | POA: Diagnosis not present

## 2023-09-11 DIAGNOSIS — Z9181 History of falling: Secondary | ICD-10-CM | POA: Diagnosis not present

## 2023-09-11 DIAGNOSIS — Z8616 Personal history of COVID-19: Secondary | ICD-10-CM | POA: Diagnosis not present

## 2023-09-11 DIAGNOSIS — K219 Gastro-esophageal reflux disease without esophagitis: Secondary | ICD-10-CM | POA: Diagnosis not present

## 2023-09-11 DIAGNOSIS — Z7982 Long term (current) use of aspirin: Secondary | ICD-10-CM | POA: Diagnosis not present

## 2023-09-11 DIAGNOSIS — J449 Chronic obstructive pulmonary disease, unspecified: Secondary | ICD-10-CM | POA: Diagnosis not present

## 2023-09-11 DIAGNOSIS — E1122 Type 2 diabetes mellitus with diabetic chronic kidney disease: Secondary | ICD-10-CM | POA: Diagnosis not present

## 2023-09-11 DIAGNOSIS — I5033 Acute on chronic diastolic (congestive) heart failure: Secondary | ICD-10-CM | POA: Diagnosis not present

## 2023-09-11 DIAGNOSIS — Z7984 Long term (current) use of oral hypoglycemic drugs: Secondary | ICD-10-CM | POA: Diagnosis not present

## 2023-09-11 DIAGNOSIS — E7800 Pure hypercholesterolemia, unspecified: Secondary | ICD-10-CM | POA: Diagnosis not present

## 2023-09-11 DIAGNOSIS — E039 Hypothyroidism, unspecified: Secondary | ICD-10-CM | POA: Diagnosis not present

## 2023-09-11 DIAGNOSIS — I6522 Occlusion and stenosis of left carotid artery: Secondary | ICD-10-CM | POA: Diagnosis not present

## 2023-09-11 DIAGNOSIS — Z86711 Personal history of pulmonary embolism: Secondary | ICD-10-CM | POA: Diagnosis not present

## 2023-09-12 ENCOUNTER — Other Ambulatory Visit: Payer: Self-pay | Admitting: Licensed Clinical Social Worker

## 2023-09-12 ENCOUNTER — Other Ambulatory Visit: Payer: Self-pay

## 2023-09-12 DIAGNOSIS — Z952 Presence of prosthetic heart valve: Secondary | ICD-10-CM | POA: Diagnosis not present

## 2023-09-12 DIAGNOSIS — Z8616 Personal history of COVID-19: Secondary | ICD-10-CM | POA: Diagnosis not present

## 2023-09-12 DIAGNOSIS — E7800 Pure hypercholesterolemia, unspecified: Secondary | ICD-10-CM | POA: Diagnosis not present

## 2023-09-12 DIAGNOSIS — R410 Disorientation, unspecified: Secondary | ICD-10-CM | POA: Diagnosis not present

## 2023-09-12 DIAGNOSIS — Z86711 Personal history of pulmonary embolism: Secondary | ICD-10-CM | POA: Diagnosis not present

## 2023-09-12 DIAGNOSIS — Z7982 Long term (current) use of aspirin: Secondary | ICD-10-CM | POA: Diagnosis not present

## 2023-09-12 DIAGNOSIS — I6522 Occlusion and stenosis of left carotid artery: Secondary | ICD-10-CM | POA: Diagnosis not present

## 2023-09-12 DIAGNOSIS — E039 Hypothyroidism, unspecified: Secondary | ICD-10-CM | POA: Diagnosis not present

## 2023-09-12 DIAGNOSIS — J449 Chronic obstructive pulmonary disease, unspecified: Secondary | ICD-10-CM | POA: Diagnosis not present

## 2023-09-12 DIAGNOSIS — I480 Paroxysmal atrial fibrillation: Secondary | ICD-10-CM | POA: Diagnosis not present

## 2023-09-12 DIAGNOSIS — Z7984 Long term (current) use of oral hypoglycemic drugs: Secondary | ICD-10-CM | POA: Diagnosis not present

## 2023-09-12 DIAGNOSIS — I130 Hypertensive heart and chronic kidney disease with heart failure and stage 1 through stage 4 chronic kidney disease, or unspecified chronic kidney disease: Secondary | ICD-10-CM | POA: Diagnosis not present

## 2023-09-12 DIAGNOSIS — Z9181 History of falling: Secondary | ICD-10-CM | POA: Diagnosis not present

## 2023-09-12 DIAGNOSIS — I5033 Acute on chronic diastolic (congestive) heart failure: Secondary | ICD-10-CM | POA: Diagnosis not present

## 2023-09-12 DIAGNOSIS — N183 Chronic kidney disease, stage 3 unspecified: Secondary | ICD-10-CM | POA: Diagnosis not present

## 2023-09-12 DIAGNOSIS — E1122 Type 2 diabetes mellitus with diabetic chronic kidney disease: Secondary | ICD-10-CM | POA: Diagnosis not present

## 2023-09-12 DIAGNOSIS — K219 Gastro-esophageal reflux disease without esophagitis: Secondary | ICD-10-CM | POA: Diagnosis not present

## 2023-09-12 DIAGNOSIS — Z950 Presence of cardiac pacemaker: Secondary | ICD-10-CM | POA: Diagnosis not present

## 2023-09-12 DIAGNOSIS — I4892 Unspecified atrial flutter: Secondary | ICD-10-CM | POA: Diagnosis not present

## 2023-09-12 DIAGNOSIS — M1990 Unspecified osteoarthritis, unspecified site: Secondary | ICD-10-CM | POA: Diagnosis not present

## 2023-09-12 NOTE — Patient Outreach (Signed)
 Complex Care Management   Visit Note  09/12/2023  Name:  Jessica [name] MRN: [id number] DOB: [birth year]  Situation: Referral received for Complex Care Management related to management of anxiety and stress issues faced related to managing health needs I obtained verbal consent from Patient.  Visit completed with patient  on the phone  Background:   Past Medical History:  Diagnosis Date   Anxiety    Atrial fibrillation and flutter (HCC)    detected by PPM interrogation (mostly atrial flutter)   AV block, 2nd degree 10/2012   s/p MDT ADDRL1 pacemaker implantation 10/10/2012 by Dr Nunzio Belch   Chronic diastolic heart failure (HCC)    Depression    GERD (gastroesophageal reflux disease)    HH (hiatus hernia)    History of pulmonary embolism 10/2008   Bilateral   Hyperlipidemia    Hypertension    Hypothyroidism    Obesity    Osteoarthritis    Peripheral neuropathy    Right leg   Presence of permanent cardiac pacemaker    Pulmonary nodule    in the lingula. Noted on pre TAVR CT. Will require follow up   Renal insufficiency 2010   S/P TAVR (transcatheter aortic valve replacement)    Shingles 09/17/2014   right lower quadrant   Type II or unspecified type diabetes mellitus without mention of complication, not stated as uncontrolled 2009    Assessment: Patient Reported Symptoms:  Cognitive    Dementia diagnosis      Neurological    Dementia   HEENT    No issues noted    Cardiovascular  CHF; HTN    Respiratory  CHF ; DOE     Endocrine Patient reports the following symptoms related to hypoglycemia or hyperglycemia : No symptoms reported Is patient diabetic?: Yes Is patient checking blood sugars at home?: No Endocrine Management Strategies: Medication therapy, Routine screening  Gastrointestinal Gastrointestinal Symptoms Reported: No symptoms reported Gastrointestinal Management Strategies: Adequate rest Nutrition Risk Screen (CP): No indicators present  Genitourinary  Genitourinary Symptoms Reported: Other (History of UTI) Other Genitourinary Symptoms: History of UTI Genitourinary Conditions: Urinary tract infection Genitourinary Management Strategies: Incontinence garment/pad  Integumentary Integumentary Symptoms Reported: No symptoms reported Skin Management Strategies: Adequate rest  Musculoskeletal Musculoskelatal Symptoms Reviewed: Unsteady gait, Difficulty walking Musculoskeletal Conditions: Osteoarthritis Musculoskeletal Management Strategies: Adequate rest, Coping strategies Musculoskeletal Self-Management Outcome: 3 (uncertain)   Patient at Risk for Falls Due to: History of fall(s)  Psychosocial Psychosocial Symptoms Reported: Anxiety - if selected complete GAD Additional Psychological Details: stress/anxiety issues Behavioral Health Conditions: Dementia, Depression Behavioral Management Strategies: Adequate rest, Community resources Behavioral Health Self-Management Outcome: 3 (uncertain) Techniques to Cope with Loss/Stress/Change: Counseling, Medication Quality of Family Relationships: supportive Do you feel physically threatened by others?: No      09/12/2023   11:58 AM  Depression screen PHQ 2/9  Decreased Interest 1  Down, Depressed, Hopeless 1  PHQ - 2 Score 2  Altered sleeping 1  Tired, decreased energy 1  Change in appetite 0  Feeling bad or failure about yourself  1  Trouble concentrating 1  Moving slowly or fidgety/restless 1  Suicidal thoughts 0  PHQ-9 Score 7  Difficult doing work/chores Somewhat difficult    Vitals:  Within normal range, per Sharon Dutko  Medications Reviewed Today     Reviewed by Suann Elms, LCSW (Social Worker) on 09/12/23 at 1148  Med List Status: <None>   Medication Order Taking? Sig Documenting Provider Last Dose Status Informant  albuterol  (VENTOLIN   HFA) 108 (90 Base) MCG/ACT inhaler [id number] No Inhale 2 puffs into the lungs every 6 (six) hours as needed for wheezing or shortness  of breath.  Patient not taking: Reported on 07/25/2023   Gonfa, Taye T, MD Not Taking Active Nursing Home Medication Administration Guide (MAG)  allopurinol  (ZYLOPRIM ) 100 MG tablet [id number] No TAKE 1 TABLET BY MOUTH DAILY Plotnikov, Aleksei V, MD Unknown Active   aspirin  EC 81 MG tablet [id number] Yes Take 162 mg by mouth daily. Swallow whole. [provider] Taking Active Self  b complex vitamins tablet [id number] Yes Take 1 tablet by mouth daily. [provider] Taking Active Nursing Home Medication Administration Guide (MAG)  cefadroxil  (DURICEF) 500 MG capsule [id number] Yes Take 1 capsule (500 mg total) by mouth 2 (two) times daily. Jamesetta Mcbride, MD Taking Active   Cholecalciferol  1000 UNITS tablet [id number] No Take 1,000 Units by mouth daily.  Patient not taking: Reported on 09/12/2023   [provider] Not Taking Active Nursing Home Medication Administration Guide (MAG)  CINNAMON PO [id number] Yes Take 1 capsule by mouth daily. [provider] Taking Active   empagliflozin  (JARDIANCE ) 10 MG TABS tablet [id number] Yes Take 1 tablet (10 mg total) by mouth daily. Plotnikov, Aleksei V, MD Taking Active   ezetimibe  (ZETIA ) 10 MG tablet [id number] Yes TAKE 1 TABLET BY MOUTH DAILY Maudine Sos, MD Taking Active   furosemide  (LASIX ) 40 MG tablet [id number] Yes TAKE 1 TABLET BY MOUTH TWICE  DAILY Plotnikov, Aleksei V, MD Taking Active   levothyroxine  (SYNTHROID ) 75 MCG tablet [id number]  TAKE 1 TABLET BY MOUTH DAILY Plotnikov, Aleksei V, MD  Active   LORazepam  (ATIVAN ) 0.5 MG tablet [id number] Yes TAKE 1 TABLET(0.5 MG) BY MOUTH TWICE DAILY AS NEEDED FOR ANXIETY Plotnikov, Aleksei V, MD Taking Active   metFORMIN  (GLUCOPHAGE ) 500 MG tablet [id number] Yes TAKE 1 TABLET BY MOUTH TWICE  DAILY WITH MEALS Plotnikov, Aleksei V, MD Taking Active   metoprolol  succinate (TOPROL -XL) 50 MG 24 hr tablet [id number] Yes TAKE 1 TABLET BY MOUTH DAILY  WITH OR IMMEDIATELY FOLLOWING A  MEAL  Plotnikov, Aleksei V, MD Taking Active   Multiple Vitamin (MULTIVITAMIN) tablet [id number] Yes Take 1 tablet by mouth daily. [provider] Taking Active Nursing Home Medication Administration Guide (MAG)  nystatin  powder [id number] No Apply 1 Application topically daily. Apply to under breast topically every day shift for redness/moisture. [provider] Unknown Active Nursing Home Medication Administration Guide (MAG)  potassium chloride  SA (KLOR-CON  M) 20 MEQ tablet [id number] Yes TAKE 1 TABLET BY MOUTH DAILY Plotnikov, Aleksei V, MD Taking Active   repaglinide  (PRANDIN ) 2 MG tablet [id number] No TAKE 2 TABLETS BY MOUTH 3 TIMES  DAILY BEFORE MEALS Plotnikov, Oakley Bellman, MD Unknown Active   rosuvastatin  (CRESTOR ) 10 MG tablet [id number] No TAKE 1 TABLET BY MOUTH UP TO 3  TIMES WEEKLY AS TOLERATED  Patient not taking: Reported on 09/12/2023   Tylene Galla, PA-C Not Taking Active   sacubitril -valsartan  (ENTRESTO ) 97-103 MG [id number] Yes Take 1 tablet by mouth 2 (two) times daily. Gonfa, Taye T, MD Taking Active Nursing Home Medication Administration Guide (MAG)           Med Note Linn Rich   Thu Aug 10, 2022  9:52 AM) Has PAP  spironolactone  (ALDACTONE ) 25 MG tablet [id number] Yes TAKE ONE-HALF(12.5MG ) TABLET BY MOUTH DAILY Walker, Caitlin S, NP Taking Active   temazepam  (RESTORIL ) 15 MG capsule [id number] Yes TAKE 1 CAPSULE BY MOUTH EVERY  NIGHT AT BEDTIME AS NEEDED Plotnikov, Aleksei V, MD Taking Active   venlafaxine  XR (EFFEXOR -XR) 75 MG 24 hr capsule [id number] Yes TAKE 1 CAPSULE BY MOUTH DAILY  WITH BREAKFAST Plotnikov, Aleksei V, MD Taking Active   Med List Note Kristina Pfeiffer 04/18/22 1633): [address] [phone]            Recommendation:   PCP follow up as needed Client to take medications as prescribed Client to attend scheduled medical appointments Client to call LCSW as needed for SW support Client to call RN Juana Wallace as needed for  nursing support Client to cooperate with HHPT, HHOT and HHAide providers in the home helping client as needed Client to allow time for rest and self care. Client to allow time for ADLs completion  Follow Up Plan:   Telephone follow up appointment date/time:  10/17/23 at 10:00 AM    Alexandria Angel  MSW, LCSW [place]/Value Based Care [facility] Licensed Clinical Social Worker Direct Dial:  [phone] Fax:  [phone] Website:  [email or web address]

## 2023-09-12 NOTE — Patient Instructions (Signed)
 Visit Information  Thank you for taking time to visit with me today. Please don't hesitate to contact me if I can be of assistance to you before our next scheduled appointment.  Our next appointment is by telephone on 10/17/23 at 10:00 AM     Please call the care guide team at [phone] if you need to cancel or reschedule your appointment.   Following is a copy of your care plan:   Goals Addressed             This Visit's Progress    VBCI Social Work Care Plan       Problems:   Unsteady Gait: risk of falls. Uses wheelchair for ambulation             Needs help with daily ADLs             Has in home aide support as scheduled weekly              Has in home PT and in home OT as scheduled weekly             Has some challenges possibly in medication administration    CSW Clinical Goal(s):   Over the next 30 days the Patient will attend all scheduled medical appointments as evidenced by patient report and care team review of appointment completion in electronic medical record.               Over next 30 days, patient will cooperate with in home care providers such as HHPT, HHOT and HHAide to address client needs AEB client report to LCSW  Interventions:  LCSW spoke with Vangie Genet, niece of client today regarding client needs and status.  Vangie Genet is on client contact list            Discussed pain issues of client, appetite of client and transport needs of client. Vangie Genet transports client to and from client medical appointments            Discussed medication adherence of client. Client does take numerous medications. Vangie Genet said client has a list of medications and when to take each medication             Discussed program support for client with RN support, LCSW support and Pharmacist support            Discussed fall risk of client. Genevia Kern said client had not had any recent falls             Encouraged Vangie Genet or client to call LCSW as needed for SW  support for client            Reminded Genevia Kern that RN Lindi Revering had also been communicating with client for nursing support  Patient Goals/Self-Care Activities:  Attend appointments as scheduled with PCP           Take medications as prescribed            Communicate as needed with Vangie Genet to discuss client needs            Cooperate with [facility] services providers helping client in the home             Communicate as needed with LCSW for SW support  Plan:   Telephone follow up appointment with care management team member scheduled for:  10/17/23 at 10:00 AM            Please go to [facility]  [facility] [address], [place] [phone]) if you are experiencing a Mental Health or Behavioral Health Crisis or need someone to talk to.  The patient verbalized understanding of instructions, educational materials, and care plan provided today and DECLINED offer to receive copy of patient instructions, educational materials, and care plan.    Alexandria Angel  MSW, LCSW [place]/Value Based Care [facility] Licensed Clinical Social Worker Direct Dial:  [phone] Fax:  [phone] Website:  [email or web address]

## 2023-09-17 DIAGNOSIS — R410 Disorientation, unspecified: Secondary | ICD-10-CM | POA: Diagnosis not present

## 2023-09-17 DIAGNOSIS — Z950 Presence of cardiac pacemaker: Secondary | ICD-10-CM | POA: Diagnosis not present

## 2023-09-17 DIAGNOSIS — E7800 Pure hypercholesterolemia, unspecified: Secondary | ICD-10-CM | POA: Diagnosis not present

## 2023-09-17 DIAGNOSIS — J449 Chronic obstructive pulmonary disease, unspecified: Secondary | ICD-10-CM | POA: Diagnosis not present

## 2023-09-17 DIAGNOSIS — M1990 Unspecified osteoarthritis, unspecified site: Secondary | ICD-10-CM | POA: Diagnosis not present

## 2023-09-17 DIAGNOSIS — Z8616 Personal history of COVID-19: Secondary | ICD-10-CM | POA: Diagnosis not present

## 2023-09-17 DIAGNOSIS — I5033 Acute on chronic diastolic (congestive) heart failure: Secondary | ICD-10-CM | POA: Diagnosis not present

## 2023-09-17 DIAGNOSIS — I130 Hypertensive heart and chronic kidney disease with heart failure and stage 1 through stage 4 chronic kidney disease, or unspecified chronic kidney disease: Secondary | ICD-10-CM | POA: Diagnosis not present

## 2023-09-17 DIAGNOSIS — E1122 Type 2 diabetes mellitus with diabetic chronic kidney disease: Secondary | ICD-10-CM | POA: Diagnosis not present

## 2023-09-17 DIAGNOSIS — Z7984 Long term (current) use of oral hypoglycemic drugs: Secondary | ICD-10-CM | POA: Diagnosis not present

## 2023-09-17 DIAGNOSIS — Z9181 History of falling: Secondary | ICD-10-CM | POA: Diagnosis not present

## 2023-09-17 DIAGNOSIS — I4892 Unspecified atrial flutter: Secondary | ICD-10-CM | POA: Diagnosis not present

## 2023-09-17 DIAGNOSIS — I480 Paroxysmal atrial fibrillation: Secondary | ICD-10-CM | POA: Diagnosis not present

## 2023-09-17 DIAGNOSIS — Z952 Presence of prosthetic heart valve: Secondary | ICD-10-CM | POA: Diagnosis not present

## 2023-09-17 DIAGNOSIS — Z86711 Personal history of pulmonary embolism: Secondary | ICD-10-CM | POA: Diagnosis not present

## 2023-09-17 DIAGNOSIS — N183 Chronic kidney disease, stage 3 unspecified: Secondary | ICD-10-CM | POA: Diagnosis not present

## 2023-09-17 DIAGNOSIS — Z7982 Long term (current) use of aspirin: Secondary | ICD-10-CM | POA: Diagnosis not present

## 2023-09-17 DIAGNOSIS — K219 Gastro-esophageal reflux disease without esophagitis: Secondary | ICD-10-CM | POA: Diagnosis not present

## 2023-09-17 DIAGNOSIS — E039 Hypothyroidism, unspecified: Secondary | ICD-10-CM | POA: Diagnosis not present

## 2023-09-17 DIAGNOSIS — I6522 Occlusion and stenosis of left carotid artery: Secondary | ICD-10-CM | POA: Diagnosis not present

## 2023-09-18 DIAGNOSIS — R410 Disorientation, unspecified: Secondary | ICD-10-CM | POA: Diagnosis not present

## 2023-09-18 DIAGNOSIS — I480 Paroxysmal atrial fibrillation: Secondary | ICD-10-CM | POA: Diagnosis not present

## 2023-09-18 DIAGNOSIS — Z86711 Personal history of pulmonary embolism: Secondary | ICD-10-CM | POA: Diagnosis not present

## 2023-09-18 DIAGNOSIS — I5033 Acute on chronic diastolic (congestive) heart failure: Secondary | ICD-10-CM | POA: Diagnosis not present

## 2023-09-18 DIAGNOSIS — I4892 Unspecified atrial flutter: Secondary | ICD-10-CM | POA: Diagnosis not present

## 2023-09-18 DIAGNOSIS — Z7982 Long term (current) use of aspirin: Secondary | ICD-10-CM | POA: Diagnosis not present

## 2023-09-18 DIAGNOSIS — M1990 Unspecified osteoarthritis, unspecified site: Secondary | ICD-10-CM | POA: Diagnosis not present

## 2023-09-18 DIAGNOSIS — K219 Gastro-esophageal reflux disease without esophagitis: Secondary | ICD-10-CM | POA: Diagnosis not present

## 2023-09-18 DIAGNOSIS — Z9181 History of falling: Secondary | ICD-10-CM | POA: Diagnosis not present

## 2023-09-18 DIAGNOSIS — Z7984 Long term (current) use of oral hypoglycemic drugs: Secondary | ICD-10-CM | POA: Diagnosis not present

## 2023-09-18 DIAGNOSIS — E7800 Pure hypercholesterolemia, unspecified: Secondary | ICD-10-CM | POA: Diagnosis not present

## 2023-09-18 DIAGNOSIS — J449 Chronic obstructive pulmonary disease, unspecified: Secondary | ICD-10-CM | POA: Diagnosis not present

## 2023-09-18 DIAGNOSIS — I6522 Occlusion and stenosis of left carotid artery: Secondary | ICD-10-CM | POA: Diagnosis not present

## 2023-09-18 DIAGNOSIS — Z952 Presence of prosthetic heart valve: Secondary | ICD-10-CM | POA: Diagnosis not present

## 2023-09-18 DIAGNOSIS — I130 Hypertensive heart and chronic kidney disease with heart failure and stage 1 through stage 4 chronic kidney disease, or unspecified chronic kidney disease: Secondary | ICD-10-CM | POA: Diagnosis not present

## 2023-09-18 DIAGNOSIS — Z950 Presence of cardiac pacemaker: Secondary | ICD-10-CM | POA: Diagnosis not present

## 2023-09-18 DIAGNOSIS — N183 Chronic kidney disease, stage 3 unspecified: Secondary | ICD-10-CM | POA: Diagnosis not present

## 2023-09-18 DIAGNOSIS — E039 Hypothyroidism, unspecified: Secondary | ICD-10-CM | POA: Diagnosis not present

## 2023-09-18 DIAGNOSIS — Z8616 Personal history of COVID-19: Secondary | ICD-10-CM | POA: Diagnosis not present

## 2023-09-18 DIAGNOSIS — E1122 Type 2 diabetes mellitus with diabetic chronic kidney disease: Secondary | ICD-10-CM | POA: Diagnosis not present

## 2023-09-21 DIAGNOSIS — E039 Hypothyroidism, unspecified: Secondary | ICD-10-CM | POA: Diagnosis not present

## 2023-09-21 DIAGNOSIS — J449 Chronic obstructive pulmonary disease, unspecified: Secondary | ICD-10-CM | POA: Diagnosis not present

## 2023-09-21 DIAGNOSIS — I480 Paroxysmal atrial fibrillation: Secondary | ICD-10-CM | POA: Diagnosis not present

## 2023-09-21 DIAGNOSIS — Z950 Presence of cardiac pacemaker: Secondary | ICD-10-CM | POA: Diagnosis not present

## 2023-09-21 DIAGNOSIS — I6522 Occlusion and stenosis of left carotid artery: Secondary | ICD-10-CM | POA: Diagnosis not present

## 2023-09-21 DIAGNOSIS — I130 Hypertensive heart and chronic kidney disease with heart failure and stage 1 through stage 4 chronic kidney disease, or unspecified chronic kidney disease: Secondary | ICD-10-CM | POA: Diagnosis not present

## 2023-09-21 DIAGNOSIS — Z7982 Long term (current) use of aspirin: Secondary | ICD-10-CM | POA: Diagnosis not present

## 2023-09-21 DIAGNOSIS — R410 Disorientation, unspecified: Secondary | ICD-10-CM | POA: Diagnosis not present

## 2023-09-21 DIAGNOSIS — Z9181 History of falling: Secondary | ICD-10-CM | POA: Diagnosis not present

## 2023-09-21 DIAGNOSIS — M1990 Unspecified osteoarthritis, unspecified site: Secondary | ICD-10-CM | POA: Diagnosis not present

## 2023-09-21 DIAGNOSIS — Z86711 Personal history of pulmonary embolism: Secondary | ICD-10-CM | POA: Diagnosis not present

## 2023-09-21 DIAGNOSIS — I4892 Unspecified atrial flutter: Secondary | ICD-10-CM | POA: Diagnosis not present

## 2023-09-21 DIAGNOSIS — I5033 Acute on chronic diastolic (congestive) heart failure: Secondary | ICD-10-CM | POA: Diagnosis not present

## 2023-09-21 DIAGNOSIS — Z8616 Personal history of COVID-19: Secondary | ICD-10-CM | POA: Diagnosis not present

## 2023-09-21 DIAGNOSIS — E1122 Type 2 diabetes mellitus with diabetic chronic kidney disease: Secondary | ICD-10-CM | POA: Diagnosis not present

## 2023-09-21 DIAGNOSIS — N183 Chronic kidney disease, stage 3 unspecified: Secondary | ICD-10-CM | POA: Diagnosis not present

## 2023-09-21 DIAGNOSIS — Z952 Presence of prosthetic heart valve: Secondary | ICD-10-CM | POA: Diagnosis not present

## 2023-09-21 DIAGNOSIS — Z7984 Long term (current) use of oral hypoglycemic drugs: Secondary | ICD-10-CM | POA: Diagnosis not present

## 2023-09-21 DIAGNOSIS — K219 Gastro-esophageal reflux disease without esophagitis: Secondary | ICD-10-CM | POA: Diagnosis not present

## 2023-09-21 DIAGNOSIS — E7800 Pure hypercholesterolemia, unspecified: Secondary | ICD-10-CM | POA: Diagnosis not present

## 2023-09-24 DIAGNOSIS — Z7984 Long term (current) use of oral hypoglycemic drugs: Secondary | ICD-10-CM | POA: Diagnosis not present

## 2023-09-24 DIAGNOSIS — I130 Hypertensive heart and chronic kidney disease with heart failure and stage 1 through stage 4 chronic kidney disease, or unspecified chronic kidney disease: Secondary | ICD-10-CM | POA: Diagnosis not present

## 2023-09-24 DIAGNOSIS — K219 Gastro-esophageal reflux disease without esophagitis: Secondary | ICD-10-CM | POA: Diagnosis not present

## 2023-09-24 DIAGNOSIS — Z952 Presence of prosthetic heart valve: Secondary | ICD-10-CM | POA: Diagnosis not present

## 2023-09-24 DIAGNOSIS — N183 Chronic kidney disease, stage 3 unspecified: Secondary | ICD-10-CM | POA: Diagnosis not present

## 2023-09-24 DIAGNOSIS — E7800 Pure hypercholesterolemia, unspecified: Secondary | ICD-10-CM | POA: Diagnosis not present

## 2023-09-24 DIAGNOSIS — E039 Hypothyroidism, unspecified: Secondary | ICD-10-CM | POA: Diagnosis not present

## 2023-09-24 DIAGNOSIS — I5033 Acute on chronic diastolic (congestive) heart failure: Secondary | ICD-10-CM | POA: Diagnosis not present

## 2023-09-24 DIAGNOSIS — Z950 Presence of cardiac pacemaker: Secondary | ICD-10-CM | POA: Diagnosis not present

## 2023-09-24 DIAGNOSIS — E1122 Type 2 diabetes mellitus with diabetic chronic kidney disease: Secondary | ICD-10-CM | POA: Diagnosis not present

## 2023-09-24 DIAGNOSIS — J449 Chronic obstructive pulmonary disease, unspecified: Secondary | ICD-10-CM | POA: Diagnosis not present

## 2023-09-24 DIAGNOSIS — I480 Paroxysmal atrial fibrillation: Secondary | ICD-10-CM | POA: Diagnosis not present

## 2023-09-24 DIAGNOSIS — Z86711 Personal history of pulmonary embolism: Secondary | ICD-10-CM | POA: Diagnosis not present

## 2023-09-24 DIAGNOSIS — R410 Disorientation, unspecified: Secondary | ICD-10-CM | POA: Diagnosis not present

## 2023-09-24 DIAGNOSIS — Z8616 Personal history of COVID-19: Secondary | ICD-10-CM | POA: Diagnosis not present

## 2023-09-24 DIAGNOSIS — Z7982 Long term (current) use of aspirin: Secondary | ICD-10-CM | POA: Diagnosis not present

## 2023-09-24 DIAGNOSIS — Z9181 History of falling: Secondary | ICD-10-CM | POA: Diagnosis not present

## 2023-09-24 DIAGNOSIS — I4892 Unspecified atrial flutter: Secondary | ICD-10-CM | POA: Diagnosis not present

## 2023-09-24 DIAGNOSIS — M1990 Unspecified osteoarthritis, unspecified site: Secondary | ICD-10-CM | POA: Diagnosis not present

## 2023-09-24 DIAGNOSIS — I6522 Occlusion and stenosis of left carotid artery: Secondary | ICD-10-CM | POA: Diagnosis not present

## 2023-09-25 DIAGNOSIS — I4892 Unspecified atrial flutter: Secondary | ICD-10-CM | POA: Diagnosis not present

## 2023-09-25 DIAGNOSIS — Z950 Presence of cardiac pacemaker: Secondary | ICD-10-CM | POA: Diagnosis not present

## 2023-09-25 DIAGNOSIS — E7800 Pure hypercholesterolemia, unspecified: Secondary | ICD-10-CM | POA: Diagnosis not present

## 2023-09-25 DIAGNOSIS — E1122 Type 2 diabetes mellitus with diabetic chronic kidney disease: Secondary | ICD-10-CM | POA: Diagnosis not present

## 2023-09-25 DIAGNOSIS — E039 Hypothyroidism, unspecified: Secondary | ICD-10-CM | POA: Diagnosis not present

## 2023-09-25 DIAGNOSIS — J449 Chronic obstructive pulmonary disease, unspecified: Secondary | ICD-10-CM | POA: Diagnosis not present

## 2023-09-25 DIAGNOSIS — I130 Hypertensive heart and chronic kidney disease with heart failure and stage 1 through stage 4 chronic kidney disease, or unspecified chronic kidney disease: Secondary | ICD-10-CM | POA: Diagnosis not present

## 2023-09-25 DIAGNOSIS — M1990 Unspecified osteoarthritis, unspecified site: Secondary | ICD-10-CM | POA: Diagnosis not present

## 2023-09-25 DIAGNOSIS — N183 Chronic kidney disease, stage 3 unspecified: Secondary | ICD-10-CM | POA: Diagnosis not present

## 2023-09-25 DIAGNOSIS — I6522 Occlusion and stenosis of left carotid artery: Secondary | ICD-10-CM | POA: Diagnosis not present

## 2023-09-25 DIAGNOSIS — Z7984 Long term (current) use of oral hypoglycemic drugs: Secondary | ICD-10-CM | POA: Diagnosis not present

## 2023-09-25 DIAGNOSIS — R410 Disorientation, unspecified: Secondary | ICD-10-CM | POA: Diagnosis not present

## 2023-09-25 DIAGNOSIS — Z8616 Personal history of COVID-19: Secondary | ICD-10-CM | POA: Diagnosis not present

## 2023-09-25 DIAGNOSIS — I5033 Acute on chronic diastolic (congestive) heart failure: Secondary | ICD-10-CM | POA: Diagnosis not present

## 2023-09-25 DIAGNOSIS — Z9181 History of falling: Secondary | ICD-10-CM | POA: Diagnosis not present

## 2023-09-25 DIAGNOSIS — Z7982 Long term (current) use of aspirin: Secondary | ICD-10-CM | POA: Diagnosis not present

## 2023-09-25 DIAGNOSIS — K219 Gastro-esophageal reflux disease without esophagitis: Secondary | ICD-10-CM | POA: Diagnosis not present

## 2023-09-25 DIAGNOSIS — Z86711 Personal history of pulmonary embolism: Secondary | ICD-10-CM | POA: Diagnosis not present

## 2023-09-25 DIAGNOSIS — Z952 Presence of prosthetic heart valve: Secondary | ICD-10-CM | POA: Diagnosis not present

## 2023-09-25 DIAGNOSIS — I480 Paroxysmal atrial fibrillation: Secondary | ICD-10-CM | POA: Diagnosis not present

## 2023-10-01 ENCOUNTER — Telehealth: Payer: Self-pay

## 2023-10-01 DIAGNOSIS — Z9181 History of falling: Secondary | ICD-10-CM | POA: Diagnosis not present

## 2023-10-01 DIAGNOSIS — I4892 Unspecified atrial flutter: Secondary | ICD-10-CM | POA: Diagnosis not present

## 2023-10-01 DIAGNOSIS — E1122 Type 2 diabetes mellitus with diabetic chronic kidney disease: Secondary | ICD-10-CM | POA: Diagnosis not present

## 2023-10-01 DIAGNOSIS — I5033 Acute on chronic diastolic (congestive) heart failure: Secondary | ICD-10-CM | POA: Diagnosis not present

## 2023-10-01 DIAGNOSIS — J449 Chronic obstructive pulmonary disease, unspecified: Secondary | ICD-10-CM | POA: Diagnosis not present

## 2023-10-01 DIAGNOSIS — Z8616 Personal history of COVID-19: Secondary | ICD-10-CM | POA: Diagnosis not present

## 2023-10-01 DIAGNOSIS — N183 Chronic kidney disease, stage 3 unspecified: Secondary | ICD-10-CM | POA: Diagnosis not present

## 2023-10-01 DIAGNOSIS — R410 Disorientation, unspecified: Secondary | ICD-10-CM | POA: Diagnosis not present

## 2023-10-01 DIAGNOSIS — I480 Paroxysmal atrial fibrillation: Secondary | ICD-10-CM | POA: Diagnosis not present

## 2023-10-01 DIAGNOSIS — I130 Hypertensive heart and chronic kidney disease with heart failure and stage 1 through stage 4 chronic kidney disease, or unspecified chronic kidney disease: Secondary | ICD-10-CM | POA: Diagnosis not present

## 2023-10-01 DIAGNOSIS — Z7982 Long term (current) use of aspirin: Secondary | ICD-10-CM | POA: Diagnosis not present

## 2023-10-01 DIAGNOSIS — Z7984 Long term (current) use of oral hypoglycemic drugs: Secondary | ICD-10-CM | POA: Diagnosis not present

## 2023-10-01 DIAGNOSIS — Z950 Presence of cardiac pacemaker: Secondary | ICD-10-CM | POA: Diagnosis not present

## 2023-10-01 DIAGNOSIS — Z952 Presence of prosthetic heart valve: Secondary | ICD-10-CM | POA: Diagnosis not present

## 2023-10-01 DIAGNOSIS — K219 Gastro-esophageal reflux disease without esophagitis: Secondary | ICD-10-CM | POA: Diagnosis not present

## 2023-10-01 DIAGNOSIS — E7800 Pure hypercholesterolemia, unspecified: Secondary | ICD-10-CM | POA: Diagnosis not present

## 2023-10-01 DIAGNOSIS — I6522 Occlusion and stenosis of left carotid artery: Secondary | ICD-10-CM | POA: Diagnosis not present

## 2023-10-01 DIAGNOSIS — E039 Hypothyroidism, unspecified: Secondary | ICD-10-CM | POA: Diagnosis not present

## 2023-10-01 DIAGNOSIS — Z86711 Personal history of pulmonary embolism: Secondary | ICD-10-CM | POA: Diagnosis not present

## 2023-10-01 DIAGNOSIS — M1990 Unspecified osteoarthritis, unspecified site: Secondary | ICD-10-CM | POA: Diagnosis not present

## 2023-10-02 DIAGNOSIS — Z8616 Personal history of COVID-19: Secondary | ICD-10-CM | POA: Diagnosis not present

## 2023-10-02 DIAGNOSIS — Z952 Presence of prosthetic heart valve: Secondary | ICD-10-CM | POA: Diagnosis not present

## 2023-10-02 DIAGNOSIS — I480 Paroxysmal atrial fibrillation: Secondary | ICD-10-CM | POA: Diagnosis not present

## 2023-10-02 DIAGNOSIS — Z7982 Long term (current) use of aspirin: Secondary | ICD-10-CM | POA: Diagnosis not present

## 2023-10-02 DIAGNOSIS — E039 Hypothyroidism, unspecified: Secondary | ICD-10-CM | POA: Diagnosis not present

## 2023-10-02 DIAGNOSIS — N183 Chronic kidney disease, stage 3 unspecified: Secondary | ICD-10-CM | POA: Diagnosis not present

## 2023-10-02 DIAGNOSIS — K219 Gastro-esophageal reflux disease without esophagitis: Secondary | ICD-10-CM | POA: Diagnosis not present

## 2023-10-02 DIAGNOSIS — M1990 Unspecified osteoarthritis, unspecified site: Secondary | ICD-10-CM | POA: Diagnosis not present

## 2023-10-02 DIAGNOSIS — J449 Chronic obstructive pulmonary disease, unspecified: Secondary | ICD-10-CM | POA: Diagnosis not present

## 2023-10-02 DIAGNOSIS — I130 Hypertensive heart and chronic kidney disease with heart failure and stage 1 through stage 4 chronic kidney disease, or unspecified chronic kidney disease: Secondary | ICD-10-CM | POA: Diagnosis not present

## 2023-10-02 DIAGNOSIS — E1122 Type 2 diabetes mellitus with diabetic chronic kidney disease: Secondary | ICD-10-CM | POA: Diagnosis not present

## 2023-10-02 DIAGNOSIS — Z7984 Long term (current) use of oral hypoglycemic drugs: Secondary | ICD-10-CM | POA: Diagnosis not present

## 2023-10-02 DIAGNOSIS — Z950 Presence of cardiac pacemaker: Secondary | ICD-10-CM | POA: Diagnosis not present

## 2023-10-02 DIAGNOSIS — I5033 Acute on chronic diastolic (congestive) heart failure: Secondary | ICD-10-CM | POA: Diagnosis not present

## 2023-10-02 DIAGNOSIS — Z86711 Personal history of pulmonary embolism: Secondary | ICD-10-CM | POA: Diagnosis not present

## 2023-10-02 DIAGNOSIS — E7800 Pure hypercholesterolemia, unspecified: Secondary | ICD-10-CM | POA: Diagnosis not present

## 2023-10-02 DIAGNOSIS — Z9181 History of falling: Secondary | ICD-10-CM | POA: Diagnosis not present

## 2023-10-02 DIAGNOSIS — I4892 Unspecified atrial flutter: Secondary | ICD-10-CM | POA: Diagnosis not present

## 2023-10-02 DIAGNOSIS — I6522 Occlusion and stenosis of left carotid artery: Secondary | ICD-10-CM | POA: Diagnosis not present

## 2023-10-02 DIAGNOSIS — R410 Disorientation, unspecified: Secondary | ICD-10-CM | POA: Diagnosis not present

## 2023-10-04 DIAGNOSIS — I4892 Unspecified atrial flutter: Secondary | ICD-10-CM | POA: Diagnosis not present

## 2023-10-04 DIAGNOSIS — E039 Hypothyroidism, unspecified: Secondary | ICD-10-CM | POA: Diagnosis not present

## 2023-10-04 DIAGNOSIS — E7800 Pure hypercholesterolemia, unspecified: Secondary | ICD-10-CM | POA: Diagnosis not present

## 2023-10-04 DIAGNOSIS — Z7984 Long term (current) use of oral hypoglycemic drugs: Secondary | ICD-10-CM | POA: Diagnosis not present

## 2023-10-04 DIAGNOSIS — Z7982 Long term (current) use of aspirin: Secondary | ICD-10-CM | POA: Diagnosis not present

## 2023-10-04 DIAGNOSIS — Z86711 Personal history of pulmonary embolism: Secondary | ICD-10-CM | POA: Diagnosis not present

## 2023-10-04 DIAGNOSIS — E1122 Type 2 diabetes mellitus with diabetic chronic kidney disease: Secondary | ICD-10-CM | POA: Diagnosis not present

## 2023-10-04 DIAGNOSIS — I480 Paroxysmal atrial fibrillation: Secondary | ICD-10-CM | POA: Diagnosis not present

## 2023-10-04 DIAGNOSIS — J449 Chronic obstructive pulmonary disease, unspecified: Secondary | ICD-10-CM | POA: Diagnosis not present

## 2023-10-04 DIAGNOSIS — I130 Hypertensive heart and chronic kidney disease with heart failure and stage 1 through stage 4 chronic kidney disease, or unspecified chronic kidney disease: Secondary | ICD-10-CM | POA: Diagnosis not present

## 2023-10-04 DIAGNOSIS — I6522 Occlusion and stenosis of left carotid artery: Secondary | ICD-10-CM | POA: Diagnosis not present

## 2023-10-04 DIAGNOSIS — Z8616 Personal history of COVID-19: Secondary | ICD-10-CM | POA: Diagnosis not present

## 2023-10-04 DIAGNOSIS — N183 Chronic kidney disease, stage 3 unspecified: Secondary | ICD-10-CM | POA: Diagnosis not present

## 2023-10-04 DIAGNOSIS — M1990 Unspecified osteoarthritis, unspecified site: Secondary | ICD-10-CM | POA: Diagnosis not present

## 2023-10-04 DIAGNOSIS — Z9181 History of falling: Secondary | ICD-10-CM | POA: Diagnosis not present

## 2023-10-04 DIAGNOSIS — Z950 Presence of cardiac pacemaker: Secondary | ICD-10-CM | POA: Diagnosis not present

## 2023-10-04 DIAGNOSIS — K219 Gastro-esophageal reflux disease without esophagitis: Secondary | ICD-10-CM | POA: Diagnosis not present

## 2023-10-04 DIAGNOSIS — Z952 Presence of prosthetic heart valve: Secondary | ICD-10-CM | POA: Diagnosis not present

## 2023-10-04 DIAGNOSIS — I5033 Acute on chronic diastolic (congestive) heart failure: Secondary | ICD-10-CM | POA: Diagnosis not present

## 2023-10-04 DIAGNOSIS — R410 Disorientation, unspecified: Secondary | ICD-10-CM | POA: Diagnosis not present

## 2023-10-09 DIAGNOSIS — I4892 Unspecified atrial flutter: Secondary | ICD-10-CM | POA: Diagnosis not present

## 2023-10-09 DIAGNOSIS — I130 Hypertensive heart and chronic kidney disease with heart failure and stage 1 through stage 4 chronic kidney disease, or unspecified chronic kidney disease: Secondary | ICD-10-CM | POA: Diagnosis not present

## 2023-10-09 DIAGNOSIS — I6522 Occlusion and stenosis of left carotid artery: Secondary | ICD-10-CM | POA: Diagnosis not present

## 2023-10-09 DIAGNOSIS — E7800 Pure hypercholesterolemia, unspecified: Secondary | ICD-10-CM | POA: Diagnosis not present

## 2023-10-09 DIAGNOSIS — Z9181 History of falling: Secondary | ICD-10-CM | POA: Diagnosis not present

## 2023-10-09 DIAGNOSIS — I480 Paroxysmal atrial fibrillation: Secondary | ICD-10-CM | POA: Diagnosis not present

## 2023-10-09 DIAGNOSIS — Z950 Presence of cardiac pacemaker: Secondary | ICD-10-CM | POA: Diagnosis not present

## 2023-10-09 DIAGNOSIS — R410 Disorientation, unspecified: Secondary | ICD-10-CM | POA: Diagnosis not present

## 2023-10-09 DIAGNOSIS — Z8616 Personal history of COVID-19: Secondary | ICD-10-CM | POA: Diagnosis not present

## 2023-10-09 DIAGNOSIS — E1122 Type 2 diabetes mellitus with diabetic chronic kidney disease: Secondary | ICD-10-CM | POA: Diagnosis not present

## 2023-10-09 DIAGNOSIS — N183 Chronic kidney disease, stage 3 unspecified: Secondary | ICD-10-CM | POA: Diagnosis not present

## 2023-10-09 DIAGNOSIS — Z7984 Long term (current) use of oral hypoglycemic drugs: Secondary | ICD-10-CM | POA: Diagnosis not present

## 2023-10-09 DIAGNOSIS — Z86711 Personal history of pulmonary embolism: Secondary | ICD-10-CM | POA: Diagnosis not present

## 2023-10-09 DIAGNOSIS — M1990 Unspecified osteoarthritis, unspecified site: Secondary | ICD-10-CM | POA: Diagnosis not present

## 2023-10-09 DIAGNOSIS — E039 Hypothyroidism, unspecified: Secondary | ICD-10-CM | POA: Diagnosis not present

## 2023-10-09 DIAGNOSIS — K219 Gastro-esophageal reflux disease without esophagitis: Secondary | ICD-10-CM | POA: Diagnosis not present

## 2023-10-09 DIAGNOSIS — Z7982 Long term (current) use of aspirin: Secondary | ICD-10-CM | POA: Diagnosis not present

## 2023-10-09 DIAGNOSIS — Z952 Presence of prosthetic heart valve: Secondary | ICD-10-CM | POA: Diagnosis not present

## 2023-10-09 DIAGNOSIS — I5033 Acute on chronic diastolic (congestive) heart failure: Secondary | ICD-10-CM | POA: Diagnosis not present

## 2023-10-09 DIAGNOSIS — J449 Chronic obstructive pulmonary disease, unspecified: Secondary | ICD-10-CM | POA: Diagnosis not present

## 2023-10-11 ENCOUNTER — Telehealth: Payer: Self-pay | Admitting: Pharmacy Technician

## 2023-10-11 NOTE — Progress Notes (Addendum)
   10/11/2023 Name: Jessica [name] MRN: [id number] DOB: 1934/01/08  Patient is appearing on a report for [employer] [place] Metric Diabetes and last engaged with the clinical pharmacist to discuss diabetes on 08/16/2023. Contacted patient today to discuss diabetes management and completed medication review.   Diabetes Plan from last clinical pharmacist appointment:  Diabetes: - Currently uncontrolled, A1c goal <8% - Reviewed long term cardiovascular and renal outcomes of uncontrolled blood sugar - Reviewed goal A1c - Reviewed dietary modifications including increased protein/fiber - Continue current regimen. Awaiting A1c, has PCP appt tomorrow, - If remains elevated, will need to check BG at home and consider GLP-1  Follow Up Plan: F/u A1c result(copy/paste from last note)   Medication Adherence Barriers Identified:  Patient made recommended medication changes per plan: Yes Spoke to patient's niece who informs patient is taking Jardiance  10mg  daily and Metformin  500mg  bid. She was unsure if patient was taking Repaglinide  2mg  at 2 tablets 3 times a day. Access issues with any new medication or testing device: No Per [employer] portal, patient filled 90 day supply of Jardiance  on 6/20 (sold on 6/23)  and 100 day supply of Metformin  on 6/19. Per Dr Annemarie, Repaglinide  last filled on 05/31/23 for 100 day supply. Patient is checking blood sugars as prescribed: No Per niece patient is not taking blood sugars. She informs she has the supplies to do so. She informs the reason she does not check it ispart laziness and part dementia/forgetfulness as she is 32 years old. Niece informs she visits once per week. She agrees to check patient's blood sugar when she visits. She also agrees to keep a log to bring to office visits.She also agrees to inquire if she is still taking Repaglinide .   Medication Adherence Barriers Addressed/Actions Taken:  Reviewed medication changes per plan from last clinical pharmacist  note Educated patient to contact pharmacy regarding new prescriptions and refills  Reviewed instructions for monitoring blood sugars at home and reminded patient to keep a written log to review with pharmacist Reminded patient of date/time of upcoming clinical pharmacist follow up and any upcoming PCP/specialists visits. Patient denies transportation barriers to the appointment. Yes  Next clinical pharmacist appointment is scheduled for: TBD   Kate Caddy, CPhT [facility] Office: [phone] Email: [email or web address]

## 2023-10-15 DIAGNOSIS — I5033 Acute on chronic diastolic (congestive) heart failure: Secondary | ICD-10-CM | POA: Diagnosis not present

## 2023-10-15 DIAGNOSIS — R410 Disorientation, unspecified: Secondary | ICD-10-CM | POA: Diagnosis not present

## 2023-10-15 DIAGNOSIS — E039 Hypothyroidism, unspecified: Secondary | ICD-10-CM | POA: Diagnosis not present

## 2023-10-15 DIAGNOSIS — E1122 Type 2 diabetes mellitus with diabetic chronic kidney disease: Secondary | ICD-10-CM | POA: Diagnosis not present

## 2023-10-15 DIAGNOSIS — F02818 Dementia in other diseases classified elsewhere, unspecified severity, with other behavioral disturbance: Secondary | ICD-10-CM

## 2023-10-15 DIAGNOSIS — I4892 Unspecified atrial flutter: Secondary | ICD-10-CM | POA: Diagnosis not present

## 2023-10-15 DIAGNOSIS — I480 Paroxysmal atrial fibrillation: Secondary | ICD-10-CM | POA: Diagnosis not present

## 2023-10-15 DIAGNOSIS — I130 Hypertensive heart and chronic kidney disease with heart failure and stage 1 through stage 4 chronic kidney disease, or unspecified chronic kidney disease: Secondary | ICD-10-CM | POA: Diagnosis not present

## 2023-10-15 DIAGNOSIS — Z6841 Body Mass Index (BMI) 40.0 and over, adult: Secondary | ICD-10-CM

## 2023-10-15 DIAGNOSIS — J449 Chronic obstructive pulmonary disease, unspecified: Secondary | ICD-10-CM | POA: Diagnosis not present

## 2023-10-15 DIAGNOSIS — N183 Chronic kidney disease, stage 3 unspecified: Secondary | ICD-10-CM | POA: Diagnosis not present

## 2023-10-16 NOTE — Addendum Note (Signed)
 Addended by: VICCI SELLER A on: 10/16/2023 11:03 AM   Modules accepted: Orders

## 2023-10-16 NOTE — Progress Notes (Signed)
 Remote pacemaker transmission.

## 2023-10-17 ENCOUNTER — Other Ambulatory Visit: Payer: Self-pay

## 2023-10-17 ENCOUNTER — Other Ambulatory Visit: Payer: Self-pay | Admitting: Licensed Clinical Social Worker

## 2023-10-17 NOTE — Patient Outreach (Signed)
 Complex Care Management   Visit Note  10/17/2023  Name:  Jessica [name] MRN: [id number] DOB: July 03, 1933  Situation: Referral received for Complex Care Management related to anxiety/stress issues faced I obtained verbal consent from Patient.  Visit completed with patient  on the phone  Background:   Past Medical History:  Diagnosis Date   Anxiety    Atrial fibrillation and flutter (HCC)    detected by PPM interrogation (mostly atrial flutter)   AV block, 2nd degree 10/2012   s/p MDT ADDRL1 pacemaker implantation 10/10/2012 by Dr Kelsie   Chronic diastolic heart failure (HCC)    Depression    GERD (gastroesophageal reflux disease)    HH (hiatus hernia)    History of pulmonary embolism 10/2008   Bilateral   Hyperlipidemia    Hypertension    Hypothyroidism    Obesity    Osteoarthritis    Peripheral neuropathy    Right leg   Presence of permanent cardiac pacemaker    Pulmonary nodule    in the lingula. Noted on pre TAVR CT. Will require follow up   Renal insufficiency 2010   S/P TAVR (transcatheter aortic valve replacement)    Shingles 09/17/2014   right lower quadrant   Type II or unspecified type diabetes mellitus without mention of complication, not stated as uncontrolled 2009    Assessment: Patient Reported Symptoms:  Cognitive Cognitive Status: Difficulties with attention and concentration   Health Maintenance Behaviors: Stress management, Sleep adequate  Neurological Neurological Review of Symptoms: Weakness, Dizziness Neurological Management Strategies: Adequate rest, Coping strategies, Counseling, Medication therapy  HEENT HEENT Symptoms Reported: Sudden change or loss of vision, Change or loss of hearing (has new glasses. sometimes has a little problem hearing) HEENT Management Strategies: Adequate rest, Coping strategies    Cardiovascular Cardiovascular Symptoms Reported: Dizziness, Fatigue Does patient have uncontrolled Hypertension?: No Cardiovascular  Management Strategies: Medication therapy, Fluid modification Cardiovascular Comment: encouraged to hydrate well by PCP  Respiratory Respiratory Symptoms Reported: Shortness of breath Other Respiratory Symptoms: short of breath when walking a longer distance Respiratory Management Strategies: Coping strategies, Adequate rest  Endocrine Endocrine Symptoms Reported: Weakness or fatigue, Shakiness, Shortness of breath Is patient diabetic?: Yes Is patient checking blood sugars at home?: No    Gastrointestinal Gastrointestinal Symptoms Reported: No symptoms reported Gastrointestinal Management Strategies: Adequate rest, Coping strategies    Genitourinary Genitourinary Symptoms Reported: Other Other Genitourinary Symptoms: History of UTI Genitourinary Management Strategies: Incontinence garment/pad  Integumentary Integumentary Symptoms Reported: No symptoms reported Skin Management Strategies: Coping strategies, Adequate rest  Musculoskeletal Musculoskelatal Symptoms Reviewed: Difficulty walking, Unsteady gait Musculoskeletal Management Strategies: Adequate rest, Coping strategies      Psychosocial Psychosocial Symptoms Reported: Depression - if selected complete PHQ 2-9, Anxiety - if selected complete GAD Additional Psychological Details: stress/anxiety issues Behavioral Management Strategies: Activity, Coping strategies, Adequate rest Major Change/Loss/Stressor/Fears (CP): Medical condition, self Techniques to Cope with Loss/Stress/Change: Counseling, Medication Quality of Family Relationships: supportive Do you feel physically threatened by others?: No      10/17/2023   10:35 AM  Depression screen PHQ 2/9  Decreased Interest 1  Down, Depressed, Hopeless 1  PHQ - 2 Score 2  Altered sleeping 1  Tired, decreased energy 1  Change in appetite 1  Feeling bad or failure about yourself  1  Trouble concentrating 1  Moving slowly or fidgety/restless 1  Suicidal thoughts 0  PHQ-9 Score 8   Difficult doing work/chores Somewhat difficult    Vitals:   BP being monitored through  PCP office Medications Reviewed Today     Reviewed by Frances Ozell RAMAN, LCSW (Social Worker) on 10/17/23 at 1016  Med List Status: <None>   Medication Order Taking? Sig Documenting Provider Last Dose Status Informant  albuterol  (VENTOLIN  HFA) 108 (90 Base) MCG/ACT inhaler [id number] Not taking  Inhale 2 puffs into the lungs every 6 (six) hours as needed for wheezing or shortness of breath.  Patient not taking: Reported on 10/17/2023   Gonfa, Taye T, MD  Active Nursing Home Medication Administration Guide (MAG)  allopurinol  (ZYLOPRIM ) 100 MG tablet [id number] Unknown TAKE 1 TABLET BY MOUTH DAILY Plotnikov, Aleksei V, MD  Active   aspirin  EC 81 MG tablet [id number] Yes Take 162 mg by mouth daily. Swallow whole. [provider]  Active Self  b complex vitamins tablet [id number] Yes Take 1 tablet by mouth daily. [provider]  Active Nursing Home Medication Administration Guide (MAG)  cefadroxil  (DURICEF) 500 MG capsule [id number] Yes Take 1 capsule (500 mg total) by mouth 2 (two) times daily. Overton Constance DASEN, MD  Active   Cholecalciferol  1000 UNITS tablet [id number] unknown Take 1,000 Units by mouth daily.  Patient not taking: Reported on 09/12/2023   [provider]  Active Nursing Home Medication Administration Guide (MAG)  CINNAMON PO [id number] Yes Take 1 capsule by mouth daily. [provider]  Active   empagliflozin  (JARDIANCE ) 10 MG TABS tablet [id number] Yes Take 1 tablet (10 mg total) by mouth daily. Plotnikov, Aleksei V, MD  Active   ezetimibe  (ZETIA ) 10 MG tablet [id number] Yes TAKE 1 TABLET BY MOUTH DAILY Raford Riggs, MD  Active   furosemide  (LASIX ) 40 MG tablet [id number] Yes TAKE 1 TABLET BY MOUTH TWICE  DAILY Plotnikov, Aleksei V, MD  Active   levothyroxine  (SYNTHROID ) 75 MCG tablet [id number] Yes TAKE 1 TABLET BY MOUTH DAILY Plotnikov, Aleksei V, MD  Active    LORazepam  (ATIVAN ) 0.5 MG tablet [id number] Yes TAKE 1 TABLET(0.5 MG) BY MOUTH TWICE DAILY AS NEEDED FOR ANXIETY Plotnikov, Aleksei V, MD  Active   metFORMIN  (GLUCOPHAGE ) 500 MG tablet [id number] Yes TAKE 1 TABLET BY MOUTH TWICE  DAILY WITH MEALS Plotnikov, Aleksei V, MD  Active   metoprolol  succinate (TOPROL -XL) 50 MG 24 hr tablet [id number] Yes TAKE 1 TABLET BY MOUTH DAILY  WITH OR IMMEDIATELY FOLLOWING A  MEAL Plotnikov, Aleksei V, MD  Active   Multiple Vitamin (MULTIVITAMIN) tablet [id number] Yes Take 1 tablet by mouth daily. [provider]  Active Nursing Home Medication Administration Guide (MAG)  nystatin  powder [id number] unknown Apply 1 Application topically daily. Apply to under breast topically every day shift for redness/moisture.  Patient not taking: Reported on 10/17/2023   [provider]  Active Nursing Home Medication Administration Guide (MAG)  potassium chloride  SA (KLOR-CON  M) 20 MEQ tablet [id number] Yes TAKE 1 TABLET BY MOUTH DAILY Plotnikov, Aleksei V, MD  Active   repaglinide  (PRANDIN ) 2 MG tablet [id number] Yes TAKE 2 TABLETS BY MOUTH 3 TIMES  DAILY BEFORE MEALS Plotnikov, Aleksei V, MD  Active   rosuvastatin  (CRESTOR ) 10 MG tablet [id number] Yes TAKE 1 TABLET BY MOUTH UP TO 3  TIMES WEEKLY AS TOLERATED Tillery, Almarie Kurdziel Andrew, PA-C  Active   sacubitril -valsartan  (ENTRESTO ) 97-103 MG [id number] unknown Take 1 tablet by mouth 2 (two) times daily. Gonfa, Taye T, MD  Active Nursing Home Medication Administration Guide (MAG)             spironolactone  (ALDACTONE ) 25 MG tablet [id number] unknown TAKE ONE-HALF(12.5MG ) TABLET  BY MOUTH DAILY Walker, Caitlin S, NP  Active   temazepam  (RESTORIL ) 15 MG capsule [id number] Yes TAKE 1 CAPSULE BY MOUTH EVERY NIGHT AT BEDTIME AS NEEDED Plotnikov, Aleksei V, MD  Active   venlafaxine  XR (EFFEXOR -XR) 75 MG 24 hr capsule [id number] Yes TAKE 1 CAPSULE BY MOUTH DAILY  WITH BREAKFAST Plotnikov, Aleksei V, MD  Active                Recommendation:   PCP Follow-up Continue Current Plan of Care Call LCSW as needed for SW support at [phone] Use coping skills learned to manage anxiety or stress issues faced Allow time for ADLs completion Take medications as prescribed  Follow Up Plan:   Telephone follow up appointment date/time:  11/26/23 at 9:30 AM   Glendia Pear  MSW, LCSW [place]/Value Based Care [facility] Licensed Clinical Social Worker Direct Dial:  [phone] Fax:  [phone] Website:  [email or web address]

## 2023-10-17 NOTE — Patient Instructions (Signed)
 Visit Information  Thank you for taking time to visit with me today. Please don't hesitate to contact me if I can be of assistance to you before our next scheduled appointment.  Our next appointment is by telephone on 11/26/23 at 9:30 AM   Please call the care guide team at [phone] if you need to cancel or reschedule your appointment.   Following is a copy of your care plan:   Goals Addressed             This Visit's Progress    VBCI Social Work Care Plan       Problems:   Pain issues             Depression issues; anxiety issues             Transport needs             Fall risk   CSW Clinical Goal(s):   Over the next 30 days the Patient will attend all scheduled medical appointments as evidenced by patient report and care team review of appointment completion in electronic MEDICAL RECORD NUMBER .            Over the next 30 days, patient will use coping skills learned to manage anxiety and stress issues faced AEB client report of reduced anxiety symptoms (she likes to watch golf shows, liked going to watch golf in the past, enjoys visit with niece, [name]., Listens to music)  Interventions:  Reviewed medications list for EPIC            Discussed client appointments with medical providers            Encouraged to call LCSW as needed for SW support              Discussed knee pain issues              Provided counseling support             Discussed program support with RN [name]             Discussed support from PCP. Dr. Garald             Discussed mood of client. Encouraged her to use stress reduction activities of choice to manage mood issues              Discussed family support. She has support from her niece, [name]. Also she has granddaughter who now stays with client. This is helpful to client.              Discussed grief issues faced by client and coping with grief issues  Patient Goals/Self-Care Activities:  Take medications as prescribed              Attend scheduled medical appointments             Use coping skills to manage anxiety issues             Allow time to complete ADLs             Call LCSW as needed for SW support Plan:   Telephone follow up appointment with care management team member scheduled for:  11/26/23 at 9:30 AM        Please go to [facility] [address] [place] [phone]) if you are experiencing a Mental Health or Behavioral Health Crisis or need someone to talk to.  The patient verbalized understanding of  instructions, educational materials, and care plan provided today and DECLINED offer to receive copy of patient instructions, educational materials, and care plan.     Glendia Pear  MSW, LCSW [facility] Licensed Clinical Social Worker Direct Dial:  [phone] Fax:  [phone] Website:  [email or web address]

## 2023-10-18 DIAGNOSIS — E1122 Type 2 diabetes mellitus with diabetic chronic kidney disease: Secondary | ICD-10-CM | POA: Diagnosis not present

## 2023-10-18 DIAGNOSIS — Z952 Presence of prosthetic heart valve: Secondary | ICD-10-CM | POA: Diagnosis not present

## 2023-10-18 DIAGNOSIS — Z7984 Long term (current) use of oral hypoglycemic drugs: Secondary | ICD-10-CM | POA: Diagnosis not present

## 2023-10-18 DIAGNOSIS — E039 Hypothyroidism, unspecified: Secondary | ICD-10-CM | POA: Diagnosis not present

## 2023-10-18 DIAGNOSIS — Z950 Presence of cardiac pacemaker: Secondary | ICD-10-CM | POA: Diagnosis not present

## 2023-10-18 DIAGNOSIS — N183 Chronic kidney disease, stage 3 unspecified: Secondary | ICD-10-CM | POA: Diagnosis not present

## 2023-10-18 DIAGNOSIS — R410 Disorientation, unspecified: Secondary | ICD-10-CM | POA: Diagnosis not present

## 2023-10-18 DIAGNOSIS — I4892 Unspecified atrial flutter: Secondary | ICD-10-CM | POA: Diagnosis not present

## 2023-10-18 DIAGNOSIS — Z86711 Personal history of pulmonary embolism: Secondary | ICD-10-CM | POA: Diagnosis not present

## 2023-10-18 DIAGNOSIS — I130 Hypertensive heart and chronic kidney disease with heart failure and stage 1 through stage 4 chronic kidney disease, or unspecified chronic kidney disease: Secondary | ICD-10-CM | POA: Diagnosis not present

## 2023-10-18 DIAGNOSIS — I480 Paroxysmal atrial fibrillation: Secondary | ICD-10-CM | POA: Diagnosis not present

## 2023-10-18 DIAGNOSIS — I5033 Acute on chronic diastolic (congestive) heart failure: Secondary | ICD-10-CM | POA: Diagnosis not present

## 2023-10-18 DIAGNOSIS — I6522 Occlusion and stenosis of left carotid artery: Secondary | ICD-10-CM | POA: Diagnosis not present

## 2023-10-18 DIAGNOSIS — Z7982 Long term (current) use of aspirin: Secondary | ICD-10-CM | POA: Diagnosis not present

## 2023-10-18 DIAGNOSIS — Z9181 History of falling: Secondary | ICD-10-CM | POA: Diagnosis not present

## 2023-10-18 DIAGNOSIS — J449 Chronic obstructive pulmonary disease, unspecified: Secondary | ICD-10-CM | POA: Diagnosis not present

## 2023-10-18 DIAGNOSIS — M1990 Unspecified osteoarthritis, unspecified site: Secondary | ICD-10-CM | POA: Diagnosis not present

## 2023-10-18 DIAGNOSIS — E7800 Pure hypercholesterolemia, unspecified: Secondary | ICD-10-CM | POA: Diagnosis not present

## 2023-10-18 DIAGNOSIS — K219 Gastro-esophageal reflux disease without esophagitis: Secondary | ICD-10-CM | POA: Diagnosis not present

## 2023-10-18 NOTE — Progress Notes (Unsigned)
  Electrophysiology Office Note:   ID:  Jessica [name], DOB Aug 27, 1933, MRN [id number]  Primary Cardiologist: Annabella Scarce, MD Electrophysiologist: OLE ONEIDA HOLTS, MD  {Click to update primary MD,subspecialty MD or APP then REFRESH:1}    History of Present Illness:   Jessica [name] is a 89 y.o. female with h/o nonobstructive CAD, severe AS s/p TAVR with endocarditis, CHB s/p PPM, atrial fibrillation s/p AV ablation 2019, HTN, HLD, DM2, chronic systolic and diastolic heart failure seen today for routine electrophysiology followup.   Since last being seen in our clinic the patient reports doing ***.  she denies chest pain, palpitations, dyspnea, PND, orthopnea, nausea, vomiting, dizziness, syncope, edema, weight gain, or early satiety.   Review of systems complete and found to be negative unless listed in HPI.   EP Information / Studies Reviewed:    EKG is ordered today. Personal review as below.       PPM Interrogation-  reviewed in detail today,  See PACEART report.  Arrhythmia/Device History PPM-Medtronic-Carelink   Physical Exam:   VS:  There were no vitals taken for this visit.   Wt Readings from Last 3 Encounters:  07/05/23 238 lb (108 kg)  05/07/23 238 lb (108 kg)  02/28/23 232 lb (105.2 kg)     GEN: No acute distress  NECK: No JVD; No carotid bruits CARDIAC: {EPRHYTHM:28826}, no murmurs, rubs, gallops RESPIRATORY:  Clear to auscultation without rales, wheezing or rhonchi  ABDOMEN: Soft, non-tender, non-distended EXTREMITIES:  {EDEMA LEVEL:28147::No} edema; No deformity   ASSESSMENT AND PLAN:    CHB s/p Medtronic PPM  Normal PPM function See Pace Art report No changes today  Paroxysmal atrial fibrillation Have continue to discuss OAC at length.  Refuses CHA2DS2/VASc of at least 9 Despite prior discussions would not offer pradaxa in her age group.   Chronic diastolic CHF Volume status stable  S/p TAVR Endocarditis On indefinite po ABx.       {Click here to Review PMH, Prob List, Meds, Allergies, SHx, FHx  :1}   Disposition:   Follow up with {EPPROVIDERS:28135::EP Team} {EPFOLLOW UP:28173}  Signed, Ozell Prentice Passey, PA-C

## 2023-10-19 ENCOUNTER — Ambulatory Visit: Attending: Student | Admitting: Student

## 2023-10-19 ENCOUNTER — Encounter: Payer: Self-pay | Admitting: Student

## 2023-10-19 VITALS — BP 142/82 | HR 89 | Ht 61.0 in | Wt 227.9 lb

## 2023-10-19 DIAGNOSIS — I4892 Unspecified atrial flutter: Secondary | ICD-10-CM | POA: Diagnosis not present

## 2023-10-19 DIAGNOSIS — I442 Atrioventricular block, complete: Secondary | ICD-10-CM

## 2023-10-19 DIAGNOSIS — Z79899 Other long term (current) drug therapy: Secondary | ICD-10-CM | POA: Diagnosis not present

## 2023-10-19 DIAGNOSIS — I4891 Unspecified atrial fibrillation: Secondary | ICD-10-CM

## 2023-10-19 DIAGNOSIS — Z952 Presence of prosthetic heart valve: Secondary | ICD-10-CM

## 2023-10-19 DIAGNOSIS — I5042 Chronic combined systolic (congestive) and diastolic (congestive) heart failure: Secondary | ICD-10-CM | POA: Diagnosis not present

## 2023-10-19 LAB — CUP PACEART INCLINIC DEVICE CHECK
Battery Impedance: 2115 Ohm
Battery Remaining Longevity: 34 mo
Battery Voltage: 2.76 V
Brady Statistic AP VP Percent: 11 %
Brady Statistic AP VS Percent: 0 %
Brady Statistic AS VP Percent: 88 %
Brady Statistic AS VS Percent: 1 %
Date Time Interrogation Session: 20250718125940
Implantable Lead Connection Status: 753985
Implantable Lead Connection Status: 753985
Implantable Lead Implant Date: 20140710
Implantable Lead Implant Date: 20140710
Implantable Lead Location: 753859
Implantable Lead Location: 753860
Implantable Lead Model: 5092
Implantable Lead Model: 5592
Implantable Pulse Generator Implant Date: 20140710
Lead Channel Impedance Value: 517 Ohm
Lead Channel Impedance Value: 905 Ohm
Lead Channel Pacing Threshold Amplitude: 0.5 V
Lead Channel Pacing Threshold Amplitude: 0.5 V
Lead Channel Pacing Threshold Amplitude: 0.625 V
Lead Channel Pacing Threshold Amplitude: 0.75 V
Lead Channel Pacing Threshold Pulse Width: 0.4 ms
Lead Channel Pacing Threshold Pulse Width: 0.4 ms
Lead Channel Pacing Threshold Pulse Width: 0.4 ms
Lead Channel Pacing Threshold Pulse Width: 0.4 ms
Lead Channel Sensing Intrinsic Amplitude: 1 mV
Lead Channel Setting Pacing Amplitude: 2 V
Lead Channel Setting Pacing Amplitude: 2.5 V
Lead Channel Setting Pacing Pulse Width: 0.4 ms
Lead Channel Setting Sensing Sensitivity: 4 mV
Zone Setting Status: 755011
Zone Setting Status: 755011

## 2023-10-19 NOTE — Patient Instructions (Signed)
 Medication Instructions:  Your physician recommends that you continue on your current medications as directed. Please refer to the Current Medication list given to you today.  *If you need a refill on your cardiac medications before your next appointment, please call your pharmacy*  Lab Work: BMET, CBC-TODAY If you have labs (blood work) drawn today and your tests are completely normal, you will receive your results only by: MyChart Message (if you have MyChart) OR A paper copy in the mail If you have any lab test that is abnormal or we need to change your treatment, we will call you to review the results.  Follow-Up: At [facility] you and your health needs are our priority.  As part of our continuing mission to provide you with exceptional heart care, our providers are all part of one team.  This team includes your primary Cardiologist (physician) and Advanced Practice Providers or APPs (Physician Assistants and Nurse Practitioners) who all work together to provide you with the care you need, when you need it.  Your next appointment:   6 month(s)  Provider:   Ozell Jodie Passey, PA-C

## 2023-10-20 ENCOUNTER — Ambulatory Visit: Payer: Self-pay | Admitting: Cardiology

## 2023-10-20 ENCOUNTER — Other Ambulatory Visit: Payer: Self-pay | Admitting: Internal Medicine

## 2023-10-20 DIAGNOSIS — I5032 Chronic diastolic (congestive) heart failure: Secondary | ICD-10-CM

## 2023-10-20 LAB — CBC
Hematocrit: 43.8 % (ref 34.0–46.6)
Hemoglobin: 13.9 g/dL (ref 11.1–15.9)
MCH: 30.5 pg (ref 26.6–33.0)
MCHC: 31.7 g/dL (ref 31.5–35.7)
MCV: 96 fL (ref 79–97)
Platelets: 187 x10E3/uL (ref 150–450)
RBC: 4.55 x10E6/uL (ref 3.77–5.28)
RDW: 12.8 % (ref 11.7–15.4)
WBC: 10 x10E3/uL (ref 3.4–10.8)

## 2023-10-20 LAB — BASIC METABOLIC PANEL WITH GFR
BUN/Creatinine Ratio: 27 (ref 12–28)
BUN: 42 mg/dL — ABNORMAL HIGH (ref 8–27)
CO2: 20 mmol/L (ref 20–29)
Calcium: 10.5 mg/dL — ABNORMAL HIGH (ref 8.7–10.3)
Chloride: 94 mmol/L — ABNORMAL LOW (ref 96–106)
Creatinine, Ser: 1.54 mg/dL — ABNORMAL HIGH (ref 0.57–1.00)
Glucose: 350 mg/dL — ABNORMAL HIGH (ref 70–99)
Potassium: 5 mmol/L (ref 3.5–5.2)
Sodium: 136 mmol/L (ref 134–144)
eGFR: 32 mL/min/1.73 — ABNORMAL LOW (ref >59)

## 2023-10-25 DIAGNOSIS — N183 Chronic kidney disease, stage 3 unspecified: Secondary | ICD-10-CM | POA: Diagnosis not present

## 2023-10-25 DIAGNOSIS — J449 Chronic obstructive pulmonary disease, unspecified: Secondary | ICD-10-CM | POA: Diagnosis not present

## 2023-10-25 DIAGNOSIS — Z9181 History of falling: Secondary | ICD-10-CM | POA: Diagnosis not present

## 2023-10-25 DIAGNOSIS — E1122 Type 2 diabetes mellitus with diabetic chronic kidney disease: Secondary | ICD-10-CM | POA: Diagnosis not present

## 2023-10-25 DIAGNOSIS — I480 Paroxysmal atrial fibrillation: Secondary | ICD-10-CM | POA: Diagnosis not present

## 2023-10-25 DIAGNOSIS — I5033 Acute on chronic diastolic (congestive) heart failure: Secondary | ICD-10-CM | POA: Diagnosis not present

## 2023-10-25 DIAGNOSIS — I6522 Occlusion and stenosis of left carotid artery: Secondary | ICD-10-CM | POA: Diagnosis not present

## 2023-10-25 DIAGNOSIS — Z7982 Long term (current) use of aspirin: Secondary | ICD-10-CM | POA: Diagnosis not present

## 2023-10-25 DIAGNOSIS — E039 Hypothyroidism, unspecified: Secondary | ICD-10-CM | POA: Diagnosis not present

## 2023-10-25 DIAGNOSIS — R410 Disorientation, unspecified: Secondary | ICD-10-CM | POA: Diagnosis not present

## 2023-10-25 DIAGNOSIS — Z86711 Personal history of pulmonary embolism: Secondary | ICD-10-CM | POA: Diagnosis not present

## 2023-10-25 DIAGNOSIS — Z7984 Long term (current) use of oral hypoglycemic drugs: Secondary | ICD-10-CM | POA: Diagnosis not present

## 2023-10-25 DIAGNOSIS — M1990 Unspecified osteoarthritis, unspecified site: Secondary | ICD-10-CM | POA: Diagnosis not present

## 2023-10-25 DIAGNOSIS — Z952 Presence of prosthetic heart valve: Secondary | ICD-10-CM | POA: Diagnosis not present

## 2023-10-25 DIAGNOSIS — E7800 Pure hypercholesterolemia, unspecified: Secondary | ICD-10-CM | POA: Diagnosis not present

## 2023-10-25 DIAGNOSIS — I4892 Unspecified atrial flutter: Secondary | ICD-10-CM | POA: Diagnosis not present

## 2023-10-25 DIAGNOSIS — K219 Gastro-esophageal reflux disease without esophagitis: Secondary | ICD-10-CM | POA: Diagnosis not present

## 2023-10-25 DIAGNOSIS — Z950 Presence of cardiac pacemaker: Secondary | ICD-10-CM | POA: Diagnosis not present

## 2023-10-25 DIAGNOSIS — I130 Hypertensive heart and chronic kidney disease with heart failure and stage 1 through stage 4 chronic kidney disease, or unspecified chronic kidney disease: Secondary | ICD-10-CM | POA: Diagnosis not present

## 2023-10-28 DIAGNOSIS — E1122 Type 2 diabetes mellitus with diabetic chronic kidney disease: Secondary | ICD-10-CM | POA: Diagnosis not present

## 2023-10-28 DIAGNOSIS — M1990 Unspecified osteoarthritis, unspecified site: Secondary | ICD-10-CM | POA: Diagnosis not present

## 2023-10-28 DIAGNOSIS — I5033 Acute on chronic diastolic (congestive) heart failure: Secondary | ICD-10-CM | POA: Diagnosis not present

## 2023-10-28 DIAGNOSIS — I480 Paroxysmal atrial fibrillation: Secondary | ICD-10-CM | POA: Diagnosis not present

## 2023-10-28 DIAGNOSIS — E039 Hypothyroidism, unspecified: Secondary | ICD-10-CM | POA: Diagnosis not present

## 2023-10-28 DIAGNOSIS — I130 Hypertensive heart and chronic kidney disease with heart failure and stage 1 through stage 4 chronic kidney disease, or unspecified chronic kidney disease: Secondary | ICD-10-CM | POA: Diagnosis not present

## 2023-10-28 DIAGNOSIS — Z952 Presence of prosthetic heart valve: Secondary | ICD-10-CM | POA: Diagnosis not present

## 2023-10-28 DIAGNOSIS — Z950 Presence of cardiac pacemaker: Secondary | ICD-10-CM | POA: Diagnosis not present

## 2023-10-28 DIAGNOSIS — N183 Chronic kidney disease, stage 3 unspecified: Secondary | ICD-10-CM | POA: Diagnosis not present

## 2023-10-28 DIAGNOSIS — K219 Gastro-esophageal reflux disease without esophagitis: Secondary | ICD-10-CM | POA: Diagnosis not present

## 2023-10-28 DIAGNOSIS — Z7984 Long term (current) use of oral hypoglycemic drugs: Secondary | ICD-10-CM | POA: Diagnosis not present

## 2023-10-28 DIAGNOSIS — J449 Chronic obstructive pulmonary disease, unspecified: Secondary | ICD-10-CM | POA: Diagnosis not present

## 2023-10-28 DIAGNOSIS — R410 Disorientation, unspecified: Secondary | ICD-10-CM | POA: Diagnosis not present

## 2023-10-28 DIAGNOSIS — I6522 Occlusion and stenosis of left carotid artery: Secondary | ICD-10-CM | POA: Diagnosis not present

## 2023-10-28 DIAGNOSIS — Z9181 History of falling: Secondary | ICD-10-CM | POA: Diagnosis not present

## 2023-10-28 DIAGNOSIS — E7800 Pure hypercholesterolemia, unspecified: Secondary | ICD-10-CM | POA: Diagnosis not present

## 2023-10-28 DIAGNOSIS — Z86711 Personal history of pulmonary embolism: Secondary | ICD-10-CM | POA: Diagnosis not present

## 2023-10-28 DIAGNOSIS — I4892 Unspecified atrial flutter: Secondary | ICD-10-CM | POA: Diagnosis not present

## 2023-10-28 DIAGNOSIS — Z7982 Long term (current) use of aspirin: Secondary | ICD-10-CM | POA: Diagnosis not present

## 2023-10-29 ENCOUNTER — Other Ambulatory Visit: Payer: Self-pay | Admitting: *Deleted

## 2023-10-29 MED ORDER — RIVAROXABAN 15 MG PO TABS: 15.0000 mg | ORAL_TABLET | Freq: Every day | ORAL | 0 refills | Status: DC

## 2023-10-30 ENCOUNTER — Telehealth: Payer: Self-pay | Admitting: *Deleted

## 2023-10-30 NOTE — Progress Notes (Signed)
 Complex Care Management Care Guide Note  10/30/2023 Name: RAFEEF LAU MRN: [id number] DOB: [birth year]  NATALYN SZYMANOWSKI is a 89 y.o. year old female who is a primary care patient of Plotnikov, Karlynn GAILS, MD and is actively engaged with the care management team. I reached out to [name] by phone today to assist with scheduling  with the Pharmacist.  Follow up plan: Telephone appointment with complex care management team member scheduled for:  11/27/2023  Thedford Franks, CMA [place]  [facility], [facility] Guide Direct Dial: [phone]  Fax: [phone] Website: [email or web address]

## 2023-11-05 DIAGNOSIS — Z7984 Long term (current) use of oral hypoglycemic drugs: Secondary | ICD-10-CM | POA: Diagnosis not present

## 2023-11-05 DIAGNOSIS — E1122 Type 2 diabetes mellitus with diabetic chronic kidney disease: Secondary | ICD-10-CM | POA: Diagnosis not present

## 2023-11-05 DIAGNOSIS — Z950 Presence of cardiac pacemaker: Secondary | ICD-10-CM | POA: Diagnosis not present

## 2023-11-05 DIAGNOSIS — E039 Hypothyroidism, unspecified: Secondary | ICD-10-CM | POA: Diagnosis not present

## 2023-11-05 DIAGNOSIS — I130 Hypertensive heart and chronic kidney disease with heart failure and stage 1 through stage 4 chronic kidney disease, or unspecified chronic kidney disease: Secondary | ICD-10-CM | POA: Diagnosis not present

## 2023-11-05 DIAGNOSIS — I4892 Unspecified atrial flutter: Secondary | ICD-10-CM | POA: Diagnosis not present

## 2023-11-05 DIAGNOSIS — R410 Disorientation, unspecified: Secondary | ICD-10-CM | POA: Diagnosis not present

## 2023-11-05 DIAGNOSIS — M1990 Unspecified osteoarthritis, unspecified site: Secondary | ICD-10-CM | POA: Diagnosis not present

## 2023-11-05 DIAGNOSIS — Z9181 History of falling: Secondary | ICD-10-CM | POA: Diagnosis not present

## 2023-11-05 DIAGNOSIS — I480 Paroxysmal atrial fibrillation: Secondary | ICD-10-CM | POA: Diagnosis not present

## 2023-11-05 DIAGNOSIS — Z86711 Personal history of pulmonary embolism: Secondary | ICD-10-CM | POA: Diagnosis not present

## 2023-11-05 DIAGNOSIS — I5033 Acute on chronic diastolic (congestive) heart failure: Secondary | ICD-10-CM | POA: Diagnosis not present

## 2023-11-05 DIAGNOSIS — Z7982 Long term (current) use of aspirin: Secondary | ICD-10-CM | POA: Diagnosis not present

## 2023-11-05 DIAGNOSIS — Z952 Presence of prosthetic heart valve: Secondary | ICD-10-CM | POA: Diagnosis not present

## 2023-11-05 DIAGNOSIS — I6522 Occlusion and stenosis of left carotid artery: Secondary | ICD-10-CM | POA: Diagnosis not present

## 2023-11-05 DIAGNOSIS — J449 Chronic obstructive pulmonary disease, unspecified: Secondary | ICD-10-CM | POA: Diagnosis not present

## 2023-11-05 DIAGNOSIS — N183 Chronic kidney disease, stage 3 unspecified: Secondary | ICD-10-CM | POA: Diagnosis not present

## 2023-11-05 DIAGNOSIS — K219 Gastro-esophageal reflux disease without esophagitis: Secondary | ICD-10-CM | POA: Diagnosis not present

## 2023-11-05 DIAGNOSIS — E7800 Pure hypercholesterolemia, unspecified: Secondary | ICD-10-CM | POA: Diagnosis not present

## 2023-11-15 ENCOUNTER — Ambulatory Visit: Admitting: Internal Medicine

## 2023-11-15 ENCOUNTER — Encounter: Payer: Self-pay | Admitting: Internal Medicine

## 2023-11-15 VITALS — BP 101/62 | HR 72 | Temp 97.8°F | Ht 61.0 in | Wt 224.0 lb

## 2023-11-15 DIAGNOSIS — I4891 Unspecified atrial fibrillation: Secondary | ICD-10-CM

## 2023-11-15 DIAGNOSIS — I5043 Acute on chronic combined systolic (congestive) and diastolic (congestive) heart failure: Secondary | ICD-10-CM

## 2023-11-15 DIAGNOSIS — E118 Type 2 diabetes mellitus with unspecified complications: Secondary | ICD-10-CM | POA: Diagnosis not present

## 2023-11-15 DIAGNOSIS — N183 Chronic kidney disease, stage 3 unspecified: Secondary | ICD-10-CM | POA: Diagnosis not present

## 2023-11-15 DIAGNOSIS — I4892 Unspecified atrial flutter: Secondary | ICD-10-CM | POA: Diagnosis not present

## 2023-11-15 DIAGNOSIS — I5033 Acute on chronic diastolic (congestive) heart failure: Secondary | ICD-10-CM | POA: Diagnosis not present

## 2023-11-15 DIAGNOSIS — F03918 Unspecified dementia, unspecified severity, with other behavioral disturbance: Secondary | ICD-10-CM

## 2023-11-15 MED ORDER — TIRZEPATIDE 2.5 MG/0.5ML ~~LOC~~ SOAJ
2.5000 mg | SUBCUTANEOUS | 3 refills | Status: DC
Start: 2023-11-15 — End: 2023-11-15

## 2023-11-15 MED ORDER — TIRZEPATIDE 2.5 MG/0.5ML ~~LOC~~ SOAJ: 2.5000 mg | SUBCUTANEOUS | 3 refills | Status: DC

## 2023-11-15 NOTE — Assessment & Plan Note (Signed)
 She can continue with repaglinide  and Jardiance .  We can add  Actos - she is to let me know if she is willing to start using insulin . Add Mounjaro  (Ozempic  pen may be too coomplex to use) Check A1c in 3 mo

## 2023-11-15 NOTE — Progress Notes (Signed)
 Subjective:  Patient ID: Jessica [name], female    DOB: 06-10-1933  Age: 89 y.o. MRN: [id number]  CC: Medical Management of Chronic Issues (3 mnth f/u, Rt wrist swelling a/w rt shoulder pain... Need HH orders put in and PT orders to help with leg weakness and walking for [facility])   HPI Jessica [name] presents for R shoulder pain, DM, CHF, A fib On Xarelto  now Here w/Sharon  Outpatient Medications Prior to Visit  Medication Sig Dispense Refill   allopurinol  (ZYLOPRIM ) 100 MG tablet TAKE 1 TABLET BY MOUTH DAILY 100 tablet 2   aspirin  EC 81 MG tablet Take 162 mg by mouth daily. Swallow whole.     b complex vitamins tablet Take 1 tablet by mouth daily.     cefadroxil  (DURICEF) 500 MG capsule Take 1 capsule (500 mg total) by mouth 2 (two) times daily. 60 capsule 11   CINNAMON PO Take 1 capsule by mouth daily.     empagliflozin  (JARDIANCE ) 10 MG TABS tablet Take 1 tablet (10 mg total) by mouth daily. 90 tablet 3   ezetimibe  (ZETIA ) 10 MG tablet TAKE 1 TABLET BY MOUTH DAILY 100 tablet 2   furosemide  (LASIX ) 40 MG tablet TAKE 1 TABLET BY MOUTH TWICE  DAILY 200 tablet 2   levothyroxine  (SYNTHROID ) 75 MCG tablet TAKE 1 TABLET BY MOUTH DAILY 100 tablet 2   LORazepam  (ATIVAN ) 0.5 MG tablet TAKE 1 TABLET(0.5 MG) BY MOUTH TWICE DAILY AS NEEDED FOR ANXIETY 180 tablet 1   metFORMIN  (GLUCOPHAGE ) 500 MG tablet TAKE 1 TABLET BY MOUTH TWICE  DAILY WITH MEALS 200 tablet 2   metoprolol  succinate (TOPROL -XL) 50 MG 24 hr tablet TAKE 1 TABLET BY MOUTH DAILY  WITH OR IMMEDIATELY FOLLOWING A  MEAL 100 tablet 2   Multiple Vitamin (MULTIVITAMIN) tablet Take 1 tablet by mouth daily.     repaglinide  (PRANDIN ) 2 MG tablet TAKE 2 TABLETS BY MOUTH 3 TIMES  DAILY BEFORE MEALS 600 tablet 2   Rivaroxaban  (XARELTO ) 15 MG TABS tablet Take 1 tablet (15 mg total) by mouth daily with supper. 30 tablet 0   rosuvastatin  (CRESTOR ) 10 MG tablet TAKE 1 TABLET BY MOUTH UP TO 3  TIMES WEEKLY AS TOLERATED 43 tablet 2    sacubitril -valsartan  (ENTRESTO ) 97-103 MG Take 1 tablet by mouth 2 (two) times daily. 180 tablet 3   spironolactone  (ALDACTONE ) 25 MG tablet TAKE ONE-HALF(12.5MG ) TABLET BY MOUTH DAILY 100 tablet 1   temazepam  (RESTORIL ) 15 MG capsule TAKE 1 CAPSULE BY MOUTH EVERY NIGHT AT BEDTIME AS NEEDED 90 capsule 1   venlafaxine  XR (EFFEXOR -XR) 75 MG 24 hr capsule TAKE 1 CAPSULE BY MOUTH DAILY  WITH BREAKFAST 100 capsule 2   potassium chloride  SA (KLOR-CON  M) 20 MEQ tablet TAKE 1 TABLET BY MOUTH DAILY 100 tablet 2   albuterol  (VENTOLIN  HFA) 108 (90 Base) MCG/ACT inhaler Inhale 2 puffs into the lungs every 6 (six) hours as needed for wheezing or shortness of breath. 8 g 2   Cholecalciferol  1000 UNITS tablet Take 1,000 Units by mouth daily.     nystatin  powder Apply 1 Application topically daily. Apply to under breast topically every day shift for redness/moisture. (Patient not taking: Reported on 11/15/2023)     No facility-administered medications prior to visit.    ROS: Review of Systems  Constitutional:  Positive for fatigue. Negative for activity change, appetite change, chills and unexpected weight change.  HENT:  Negative for congestion, mouth sores and sinus pressure.  Eyes:  Negative for visual disturbance.  Respiratory:  Negative for cough and chest tightness.   Gastrointestinal:  Negative for abdominal pain and nausea.  Genitourinary:  Positive for frequency. Negative for difficulty urinating and vaginal pain.  Musculoskeletal:  Positive for arthralgias and gait problem. Negative for back pain.  Skin:  Negative for pallor and rash.  Neurological:  Negative for dizziness, tremors, weakness, numbness and headaches.  Psychiatric/Behavioral:  Positive for decreased concentration. Negative for confusion, sleep disturbance and suicidal ideas. The patient is nervous/anxious.     Objective:  BP 101/62   Pulse 72   Temp 97.8 F (36.6 C) (Oral)   Ht 5' 1 (1.549 m)   Wt 224 lb (101.6 kg)   SpO2  97%   BMI 42.32 kg/m   BP Readings from Last 3 Encounters:  11/16/23 (!) 102/58  11/15/23 101/62  10/19/23 (!) 142/82    Wt Readings from Last 3 Encounters:  11/16/23 224 lb (101.6 kg)  11/15/23 224 lb (101.6 kg)  10/19/23 227 lb 14.4 oz (103.4 kg)    Physical Exam Constitutional:      General: She is not in acute distress.    Appearance: She is well-developed. She is obese.  HENT:     Head: Normocephalic.     Right Ear: External ear normal.     Left Ear: External ear normal.     Nose: Nose normal.  Eyes:     General:        Right eye: No discharge.        Left eye: No discharge.     Conjunctiva/sclera: Conjunctivae normal.     Pupils: Pupils are equal, round, and reactive to light.  Neck:     Thyroid : No thyromegaly.     Vascular: No JVD.     Trachea: No tracheal deviation.  Cardiovascular:     Rate and Rhythm: Normal rate and regular rhythm.     Heart sounds: Normal heart sounds.  Pulmonary:     Effort: No respiratory distress.     Breath sounds: No stridor. No wheezing.  Abdominal:     General: Bowel sounds are normal. There is no distension.     Palpations: Abdomen is soft. There is no mass.     Tenderness: There is no abdominal tenderness. There is no guarding or rebound.  Musculoskeletal:        General: No tenderness.     Cervical back: Normal range of motion and neck supple. No rigidity.     Right lower leg: No edema.     Left lower leg: No edema.  Lymphadenopathy:     Cervical: No cervical adenopathy.  Skin:    Findings: No erythema or rash.  Neurological:     Cranial Nerves: No cranial nerve deficit.     Motor: No abnormal muscle tone.     Coordination: Coordination normal.     Deep Tendon Reflexes: Reflexes normal.  Psychiatric:        Behavior: Behavior normal.        Thought Content: Thought content normal.        Judgment: Judgment normal.     Lab Results  Component Value Date   WBC 10.0 10/19/2023   HGB 13.9 10/19/2023   HCT 43.8  10/19/2023   PLT 187 10/19/2023   GLUCOSE 350 (H) 10/19/2023   CHOL 207 (H) 07/12/2020   TRIG 277 (H) 07/12/2020   HDL 39 (L) 07/12/2020   LDLDIRECT 155.9 09/30/2012   LDLCALC  119 (H) 07/12/2020   ALT 34 08/17/2023   AST 36 08/17/2023   NA 136 10/19/2023   K 5.0 10/19/2023   CL 94 (L) 10/19/2023   CREATININE 1.54 (H) 10/19/2023   BUN 42 (H) 10/19/2023   CO2 20 10/19/2023   TSH 2.41 08/17/2023   INR 3.9 (H) 04/25/2022   HGBA1C 9.9 (H) 08/17/2023   MICROALBUR 4.5 (H) 12/02/2009    DG Hip Unilat With Pelvis 2-3 Views Left Result Date: 05/14/2022 CLINICAL DATA:  Cough, weakness, hip pain EXAM: DG HIP (WITH OR WITHOUT PELVIS) 2-3V LEFT COMPARISON:  None Available. FINDINGS: No acute fracture or dislocation. No aggressive osseous lesion. Normal alignment. Mild osteoarthritis of the left hip. Generalized osteopenia. Soft tissue are unremarkable. No radiopaque foreign body or soft tissue emphysema. IMPRESSION: 1. Mild osteoarthritis of the left hip. Given the patient's age and osteopenia, if there is persistent clinical concern for an occult hip fracture, a MRI of the hip is recommended for increased sensitivity. Electronically Signed   By: Julaine Blanch M.D.   On: 05/14/2022 09:35   DG Chest Port 1 View Result Date: 05/14/2022 CLINICAL DATA:  Cough, pain EXAM: PORTABLE CHEST 1 VIEW COMPARISON:  04/25/2022 FINDINGS: Bilateral interstitial thickening. Small bilateral pleural effusions. Bibasilar atelectasis. No pneumothorax. Stable cardiomegaly. Prior TAVR. Dual lead cardiac pacemaker. No acute osseous abnormality. IMPRESSION: 1. Findings concerning for CHF. Electronically Signed   By: Julaine Blanch M.D.   On: 05/14/2022 09:34    Assessment & Plan:   Problem List Items Addressed This Visit     Acute on chronic diastolic CHF (congestive heart failure) (HCC)   Continue on Entresto , metoprolol , spironolactone , Jardiance . [facility] ref for PT/OT/HHA      Relevant Orders   Ambulatory  referral to Home Health   Atrial fibrillation and flutter (HCC)   Cont on Toprol , Eliquis       CHF (congestive heart failure), NYHA class II, acute on chronic, combined (HCC)   Relevant Orders   Ambulatory referral to Home Health   CRF (chronic renal failure), stage 3 (moderate) (HCC)   Hydrate well      Dementia with behavioral disturbance (HCC)   Mild - progressing Options to treat discussed      DM type 2, controlled, with complication (HCC) - Primary    She can continue with repaglinide  and Jardiance .  We can add  Actos - she is to let me know if she is willing to start using insulin . Add Mounjaro  (Ozempic  pen may be too coomplex to use) Check A1c in 3 mo      Relevant Medications   tirzepatide  (MOUNJARO ) 2.5 MG/0.5ML Pen   Other Relevant Orders   Ambulatory referral to Home Health      Meds ordered this encounter  Medications   DISCONTD: tirzepatide  (MOUNJARO ) 2.5 MG/0.5ML Pen    Sig: Inject 2.5 mg into the skin once a week.    Dispense:  2 mL    Refill:  3   tirzepatide  (MOUNJARO ) 2.5 MG/0.5ML Pen    Sig: Inject 2.5 mg into the skin once a week.    Dispense:  2 mL    Refill:  3      Follow-up: Return in about 3 months (around 02/15/2024) for a follow-up visit.  Marolyn Noel, MD

## 2023-11-15 NOTE — Assessment & Plan Note (Signed)
 Continue on Entresto , metoprolol , spironolactone , Jardiance . [facility] ref for PT/OT/HHA

## 2023-11-15 NOTE — Assessment & Plan Note (Signed)
 Hydrate well

## 2023-11-15 NOTE — Assessment & Plan Note (Signed)
 Mild - progressing Options to treat discussed

## 2023-11-15 NOTE — Patient Instructions (Addendum)
 USEFUL THINGS FOR ARTHRITIS and musculoskeletal pains:    A rice sock heating pad refers to a homemade heating pad created by filling a sock with uncooked rice, which can be heated in a microwave to provide a warm compress for sore muscles, pain relief, or other applications; essentially, it's a simple way to generate heat using readily available materials.  Key points about rice sock heat: How to make it: Fill a clean sock (preferably a tube sock) about 2/3 full with uncooked rice, tie a knot at the top to secure the rice inside.  Heating it up: Place the rice sock in the microwave and heat in short intervals (usually around 30 seconds at a time) until it reaches the desired warmth.  Important considerations: Check temperature before applying: Always test the temperature of the rice sock before applying it to your skin to avoid burns.  Use a towel to protect skin: Wrap the rice sock in a thin towel to distribute the heat evenly and protect your skin.  Uses: Muscle aches and pains  Menstrual cramps  Neck pain  Arthritis discomfort

## 2023-11-15 NOTE — Assessment & Plan Note (Signed)
 Cont on Toprol , Eliquis 

## 2023-11-16 ENCOUNTER — Ambulatory Visit (HOSPITAL_BASED_OUTPATIENT_CLINIC_OR_DEPARTMENT_OTHER): Admitting: Family

## 2023-11-16 ENCOUNTER — Encounter (HOSPITAL_BASED_OUTPATIENT_CLINIC_OR_DEPARTMENT_OTHER): Payer: Self-pay | Admitting: Family

## 2023-11-16 VITALS — BP 102/58 | HR 57 | Resp 16 | Ht 61.0 in | Wt 224.0 lb

## 2023-11-16 DIAGNOSIS — D6859 Other primary thrombophilia: Secondary | ICD-10-CM

## 2023-11-16 DIAGNOSIS — E1165 Type 2 diabetes mellitus with hyperglycemia: Secondary | ICD-10-CM

## 2023-11-16 DIAGNOSIS — E785 Hyperlipidemia, unspecified: Secondary | ICD-10-CM | POA: Diagnosis not present

## 2023-11-16 DIAGNOSIS — I4819 Other persistent atrial fibrillation: Secondary | ICD-10-CM | POA: Diagnosis not present

## 2023-11-16 DIAGNOSIS — I5022 Chronic systolic (congestive) heart failure: Secondary | ICD-10-CM

## 2023-11-16 DIAGNOSIS — I25118 Atherosclerotic heart disease of native coronary artery with other forms of angina pectoris: Secondary | ICD-10-CM

## 2023-11-16 MED ORDER — MOUNJARO 2.5 MG/0.5ML ~~LOC~~ SOAJ: 2.5000 mg | SUBCUTANEOUS | 0 refills | Status: DC

## 2023-11-16 NOTE — Progress Notes (Unsigned)
 Cardiology Office Note   Date:  11/16/2023  ID:  TERRIN IMPARATO, DOB Jul 24, 1933, MRN [id number] PCP: Garald Karlynn GAILS, MD  [facility] Providers Cardiologist:  Annabella Scarce, MD Electrophysiologist:  OLE ONEIDA HOLTS, MD { Click to update primary MD,subspecialty MD or APP then REFRESH:1}    History of Present Illness Jessica [name] is a 89 y.o. female *** with a hx of nonobstructive CAD, severe AS s/p TAVR with endocarditis, CHB s/p PPM, atrial fibrillation s/p AV ablation 2019, HTN, HLD, DM2, chronic systolic and diastolic heart failure.   Admitted 04/18/2022 with endocarditis with 3 cm vegetation on TAVR prosthesis by TEE 04/05/2022.  No lead vegetation noted.  Inadvertent finding of AKI in the setting of COVID.  On indefinite oral antibiotics.   Admitted 2/11 - 05/19/2022 with acute on chronic diastolic heart failure.  Diuresed 5.9 L.  On discharge oral Lasix , Jardiance , Entresto , metoprolol , Aldactone .  Recommended for discharge to SNF.   Seen 07/14/22 at which time Jardiance  and Eliquis  patient assistance were initiated.    Last seen 10/13/22 at which time she declined OAC due to cost concerns about Eliquis . She was given options including paying out of pocket for Medicare (had $435 remaining in her deductible then price would drop) or Warfarin - she verbalized understanding of stroke risk and declined OAC.    Pleasant lady who lives at home independently. Her niece is present at clinic visit today. She continues to work with [facility] PT/OT though most often uses her wheelchair in home. Niece has concerns about what [name] will do when [facility] PT/OT ends. Weight up 10 lbs from last clinic visit. Has not been weighing at home. Taking Lasix  40mg  daily (prescribed as twice daily). Notes she fell night before last when her feet went out from under her and landed on her knees. No prodrome, no lightheadedness, dizziness. Fire department had to come and get her up. Activity further  limited by gout in foot and arthritis in the knee. Last night walking to the bathroom felt more short of breath. ***    first one this morning  lightheadedness Hands swelling No feet swelling  ROS: ***  Studies Reviewed      *** Risk Assessment/Calculations  CHA2DS2-VASc Score = 7  {Confirm score is correct.  If not, click here to update score.  REFRESH note.  :1} This indicates a 11.2% annual risk of stroke. The patient's score is based upon: CHF History: 1 HTN History: 1 Diabetes History: 1 Stroke History: 0 Vascular Disease History: 1 Age Score: 2 Gender Score: 1   {This patient has a significant risk of stroke if diagnosed with atrial fibrillation.  Please consider VKA or DOAC agent for anticoagulation if the bleeding risk is acceptable.   You can also use the SmartPhrase .[email or web address] for documentation.   :[id number]}         Physical Exam VS:  BP (!) 102/58 (BP Location: Left Arm, Patient Position: Sitting, Cuff Size: Large)   Pulse (!) 57   Resp 16   Ht 5' 1 (1.549 m)   Wt 224 lb (101.6 kg)   SpO2 94%   BMI 42.32 kg/m        Wt Readings from Last 3 Encounters:  11/16/23 224 lb (101.6 kg)  11/15/23 224 lb (101.6 kg)  10/19/23 227 lb 14.4 oz (103.4 kg)    GEN: Well nourished, well developed in no acute distress NECK: No JVD; No carotid bruits CARDIAC: ***RRR, no murmurs, rubs, gallops  RESPIRATORY:  Clear to auscultation without rales, wheezing or rhonchi  ABDOMEN: Soft, non-tender, non-distended EXTREMITIES:  No edema; No deformity   ASSESSMENT AND PLAN Atrial fib s/p ablation / Hypercoaguable state - NSR by auscultation. Denies palpitations. Continue Lopressor  25mg  BID. Previously declined Watchman. CHA2DS2-VASc Score = 7 [CHF History: 1, HTN History: 1, Diabetes History: 1, Stroke History: 0, Vascular Disease History: 1, Age Score: 2, Gender Score: 1].  Therefore, the patient's annual risk of stroke is 11.2 %.    Not taking Eliquis  due to cost concerns.  Had not met Medicare OOP. She has declined to take Eliquis  or Warfarin and verbalizes understanding of stroke risk.  CHA2DS2-VASc Score = 7 [CHF History: 1, HTN History: 1, Diabetes History: 1, Stroke History: 0, Vascular Disease History: 1, Age Score: 2, Gender Score: 1].  Therefore, the patient's annual risk of stroke is 11.2 %.      Chronic systolic and diastolic heart failure - Weight up 10 lbs. Taking Lasix  40mg  daily (prescribed as BID). Increase to Lasix  40mg  BID x 3 days. Phone call Monday to check in.  Continue Spironolactone , Jardiance . Low sodium diet, fluid restriction <2L, and daily weights encouraged. Educated to contact our office for weight gain of 2 lbs overnight or 5 lbs in one week.  S/p TAVR / Endocarditis - Completed IV abx now on indefinite PO abx per ID. No lead vegetation per imaging. Per EP, only plan to extract PPM if needs valvular intervention. Is PPM dependent. CHB s/p PPM - Follows with EP    {Are you ordering a CV Procedure (e.g. stress test, cath, DCCV, TEE, etc)?   Press F2        :[id number]}  Dispo: ***  Signed, Reche GORMAN Finder, NP

## 2023-11-16 NOTE — Patient Instructions (Signed)
 Medication Instructions:  Continue your current medications  We have provided a Mounjaro  sample today  *If you need a refill on your cardiac medications before your next appointment, please call your pharmacy*  Follow-Up: At [facility], you and your health needs are our priority.  As part of our continuing mission to provide you with exceptional heart care, our providers are all part of one team.  This team includes your primary Cardiologist (physician) and Advanced Practice Providers or APPs (Physician Assistants and Nurse Practitioners) who all work together to provide you with the care you need, when you need it.  Your next appointment:   4-6 month(s)  Provider:   Annabella Scarce, MD, Rosaline Bane, NP, or Reche Finder, NP    We recommend signing up for the patient portal called MyChart.  Sign up information is provided on this After Visit Summary.  MyChart is used to connect with patients for Virtual Visits (Telemedicine).  Patients are able to view lab/test results, encounter notes, upcoming appointments, etc.  Non-urgent messages can be sent to your provider as well.   To learn more about what you can do with MyChart, go to [email or web address].   Other Instructions  To prevent or reduce lower extremity swelling: Eat a low salt diet. Salt makes the body hold onto extra fluid which causes swelling. Sit with legs elevated. For example, in the recliner or on an ottoman.  Wear knee-high compression stockings during the daytime. Ones labeled 15-20 mmHg provide good compression.  Heart Healthy Diet Recommendations: A low-salt diet is recommended. Meats should be grilled, baked, or boiled. Avoid fried foods. Focus on lean protein sources like fish or chicken with vegetables and fruits. The American Heart Association is a Chief Technology Officer!  American Heart Association Diet and Lifeystyle Recommendations   Exercise recommendations: The American Heart Association  recommends 150 minutes of moderate intensity exercise weekly. Try 30 minutes of moderate intensity exercise 4-5 times per week. This could include walking, jogging, or swimming.

## 2023-11-20 ENCOUNTER — Ambulatory Visit: Payer: Medicare Other | Attending: Cardiology

## 2023-11-21 ENCOUNTER — Other Ambulatory Visit (HOSPITAL_COMMUNITY): Payer: Self-pay

## 2023-11-21 ENCOUNTER — Telehealth: Payer: Self-pay

## 2023-11-21 NOTE — Telephone Encounter (Signed)
 Pharmacy Patient Advocate Encounter   Received notification from Onbase that prior authorization for Mounjaro  2.5 is required/requested.   Insurance verification completed.   The patient is insured through [facility] .   Per test claim: PA required; PA submitted to above mentioned insurance via Latent Key/confirmation #/EOC [id number] Status is pending

## 2023-11-22 ENCOUNTER — Other Ambulatory Visit (HOSPITAL_COMMUNITY): Payer: Self-pay

## 2023-11-22 NOTE — Telephone Encounter (Signed)
 Pharmacy Patient Advocate Encounter  Received notification from [employer] that Prior Authorization for Mounjaro  2.5 has been APPROVED from 11/21/23 to 04/02/24. Unable to obtain price due to refill too soon rejection, last fill date 11/19/23 next available fill date8/8/25   PA #/Case ID/Reference #: # [id number]

## 2023-11-24 ENCOUNTER — Other Ambulatory Visit: Payer: Self-pay | Admitting: Internal Medicine

## 2023-11-25 ENCOUNTER — Encounter: Payer: Self-pay | Admitting: Internal Medicine

## 2023-11-26 ENCOUNTER — Other Ambulatory Visit: Payer: Self-pay | Admitting: Licensed Clinical Social Worker

## 2023-11-26 NOTE — Patient Instructions (Signed)
 Visit Information  Thank you for taking time to visit with me today. Please don't hesitate to contact me if I can be of assistance to you before our next scheduled appointment.  Our next appointment is by telephone on 01/01/24 at 10:00 AM   Please call the care guide team at [phone] if you need to cancel or reschedule your appointment.   Following is a copy of your care plan:   Goals Addressed             This Visit's Progress    VBCI Social Work Care Plan       Problems:   Unsteady Gait: risk of falls. Uses wheelchair for ambulation             Needs help with daily ADLs             Has in home aide support as scheduled weekly             Has some challenges possibly in medication administration  Memory issues; sleep challenges  CSW Clinical Goal(s):   Over the next 30 days the Patient will attend all scheduled medical appointments as evidenced by patient report and care team review of appointment completion in electronic medical record.               Over next 30 days, patient will cooperate with in home care providers such as [facility] to  complete daily ADLs as needed AEB patient report of completed ADLs for past 30 days   Interventions:  LCSW spoke with client regarding client needs and status.             Discussed pain issues of client, appetite of client and transport needs of client. [name] Party transports client to and from client medical appointments            Discussed medication adherence of client. Client does take numerous medications.              Discussed program support for client with RN support, LCSW support and Pharmacist support            Discussed fall risk of client. Client said she has not had any recent falls.              Discussed client support with PCP           Client said that her granddaughter, Rocky , has been staying with her recently and that help of granddaughter, Rocky, has been very useful to client           Discussed mood issues.  Completed Gad-7 and PHQ 2/9           Discussed food procurement. Client said she does have meals delivered as scheduled to her home. She did not mention any food issues             Discussed ambulation: she uses wheelchair to ambulate            Encouraged client or her niece, [name] Party, to call LCSW as needed for SW support for client at [phone]  Patient Goals/Self-Care Activities:  Attend appointments as scheduled with PCP           Take medications as prescribed            Communicate as needed with [name] Party to discuss client needs            Cooperate with [facility] services providers helping client in the home  Communicate as needed with LCSW for SW support  Plan:   Telephone follow up appointment with care management team member scheduled for:  01/01/24 at 10:00 AM        Please go to [facility] [address] [place] [phone]) if you are experiencing a Mental Health or Behavioral Health Crisis or need someone to talk to.  The patient verbalized understanding of instructions, educational materials, and care plan provided today and DECLINED offer to receive copy of patient instructions, educational materials, and care plan.    Glendia Pear  MSW, LCSW /Value Based Care [facility] Licensed Clinical Social Worker Direct Dial:  [phone] Fax:  [phone] Website:  [email or web address]

## 2023-11-26 NOTE — Patient Outreach (Signed)
 Complex Care Management   Visit Note  11/26/2023  Name:  Jessica [name] MRN: [id number] DOB: [birth year]  Situation: Referral received for Complex Care Management related to SDOH needs, depression and anxiety needs I obtained verbal consent from Patient.  Visit completed with Patient  on the phone  Background:   Past Medical History:  Diagnosis Date   Anxiety    Atrial fibrillation and flutter (HCC)    detected by PPM interrogation (mostly atrial flutter)   AV block, 2nd degree 10/2012   s/p MDT ADDRL1 pacemaker implantation 10/10/2012 by Dr Kelsie   Chronic diastolic heart failure (HCC)    Depression    GERD (gastroesophageal reflux disease)    HH (hiatus hernia)    History of pulmonary embolism 10/2008   Bilateral   Hyperlipidemia    Hypertension    Hypothyroidism    Obesity    Osteoarthritis    Peripheral neuropathy    Right leg   Presence of permanent cardiac pacemaker    Pulmonary nodule    in the lingula. Noted on pre TAVR CT. Will require follow up   Renal insufficiency 2010   S/P TAVR (transcatheter aortic valve replacement)    Shingles 09/17/2014   right lower quadrant   Type II or unspecified type diabetes mellitus without mention of complication, not stated as uncontrolled 2009    Assessment: Patient Reported Symptoms:  Cognitive Cognitive Status: Difficulties with attention and concentration Cognitive/Intellectual Conditions Management [RPT]: None reported or documented in medical history or problem list   Health Maintenance Behaviors: Stress management Health Facilitated by: Stress management  Neurological Neurological Review of Symptoms: Dizziness, Weakness Neurological Management Strategies: Adequate rest, Coping strategies, Counseling  HEENT HEENT Symptoms Reported: Sudden change or loss of vision (has new glasses) HEENT Management Strategies: Adequate rest, Coping strategies    Cardiovascular Cardiovascular Symptoms Reported: Dizziness,  Fatigue Does patient have uncontrolled Hypertension?: No Cardiovascular Management Strategies: Fluid modification, Medication therapy  Respiratory Respiratory Symptoms Reported: Shortness of breath Respiratory Management Strategies: Coping strategies, Adequate rest  Endocrine Endocrine Symptoms Reported: Weakness or fatigue, Shakiness, Shortness of breath Is patient diabetic?: Yes Is patient checking blood sugars at home?: No    Gastrointestinal Gastrointestinal Symptoms Reported: No symptoms reported Gastrointestinal Management Strategies: Adequate rest, Coping strategies    Genitourinary Genitourinary Symptoms Reported: Urgency Other Genitourinary Symptoms: history of UTI Genitourinary Management Strategies: Incontinence garment/pad  Integumentary Integumentary Symptoms Reported: No symptoms reported Skin Management Strategies: Adequate rest, Coping strategies  Musculoskeletal Musculoskelatal Symptoms Reviewed: Difficulty walking, Unsteady gait, Weakness Musculoskeletal Management Strategies: Coping strategies, Medical device      Psychosocial Psychosocial Symptoms Reported: Sadness - if selected complete PHQ 2-9, Anxiety - if selected complete GAD, Depression - if selected complete PHQ 2-9 Additional Psychological Details: stress issues Behavioral Management Strategies: Coping strategies Major Change/Loss/Stressor/Fears (CP): Medical condition, self, Resources Techniques to [place] with Loss/Stress/Change: Counseling, Medication Quality of Family Relationships: supportive Do you feel physically threatened by others?: No    11/26/2023    PHQ2-9 Depression Screening   Little interest or pleasure in doing things Several days  Feeling down, depressed, or hopeless Several days  PHQ-2 - Total Score 2  Trouble falling or staying asleep, or sleeping too much Several days  Feeling tired or having little energy Several days  Poor appetite or overeating  Several days  Feeling bad about  yourself - or that you are a failure or have let yourself or your family down Several days  Trouble concentrating on things, such as  reading the newspaper or watching television Several days  Moving or speaking so slowly that other people could have noticed.  Or the opposite - being so fidgety or restless that you have been moving around a lot more than usual Several days  Thoughts that you would be better off dead, or hurting yourself in some way Not at all  PHQ2-9 Total Score 8  If you checked off any problems, how difficult have these problems made it for you to do your work, take care of things at home, or get along with other people Somewhat difficult  Depression Interventions/Treatment Medication, Counseling    Vitals:   No BP problems mentioned today by patient  Medications Reviewed Today     Reviewed by Frances Ozell GORMAN KEN (Social Worker) on 11/26/23 at [phone]  Med List Status: <None>   Medication Order Taking? Sig Documenting Provider Last Dose Status Informant  albuterol  (VENTOLIN  HFA) 108 (90 Base) MCG/ACT inhaler [id number] Not taking  Inhale 2 puffs into the lungs every 6 (six) hours as needed for wheezing or shortness of breath.  Patient not taking: Reported on 11/26/2023   Gonfa, Taye T, MD  Active Nursing Home Medication Administration Guide (MAG)  allopurinol  (ZYLOPRIM ) 100 MG tablet [id number] unknown TAKE 1 TABLET BY MOUTH DAILY Plotnikov, Aleksei V, MD  Active   aspirin  EC 81 MG tablet [id number] Not taking Take 162 mg by mouth daily. Swallow whole.  Patient not taking: Reported on 11/26/2023   [provider]  Active Self  b complex vitamins tablet [id number] unknown Take 1 tablet by mouth daily. [provider]  Active Nursing Home Medication Administration Guide (MAG)  cefadroxil  (DURICEF) 500 MG capsule [id number] unknown Take 1 capsule (500 mg total) by mouth 2 (two) times daily. Overton Constance DASEN, MD  Active   Cholecalciferol  1000 UNITS tablet [id number]  unknown Take 1,000 Units by mouth daily. [provider]  Active Nursing Home Medication Administration Guide (MAG)  CINNAMON PO [id number] unknown Take 1 capsule by mouth daily. [provider]  Active   empagliflozin  (JARDIANCE ) 10 MG TABS tablet [id number] Yes Take 1 tablet (10 mg total) by mouth daily. Plotnikov, Aleksei V, MD  Active   ezetimibe  (ZETIA ) 10 MG tablet [id number] unknown TAKE 1 TABLET BY MOUTH DAILY Raford Riggs, MD  Active   furosemide  (LASIX ) 40 MG tablet [id number] Yes TAKE 1 TABLET BY MOUTH TWICE  DAILY Plotnikov, Aleksei V, MD  Active   levothyroxine  (SYNTHROID ) 75 MCG tablet [id number] Yes TAKE 1 TABLET BY MOUTH DAILY Plotnikov, Aleksei V, MD  Active   LORazepam  (ATIVAN ) 0.5 MG tablet [id number] Yes TAKE 1 TABLET(0.5 MG) BY MOUTH TWICE DAILY AS NEEDED FOR ANXIETY Plotnikov, Aleksei V, MD  Active   metFORMIN  (GLUCOPHAGE ) 500 MG tablet [id number] Yes TAKE 1 TABLET BY MOUTH TWICE  DAILY WITH MEALS Plotnikov, Aleksei V, MD  Active   metoprolol  succinate (TOPROL -XL) 50 MG 24 hr tablet [id number] unknown TAKE 1 TABLET BY MOUTH DAILY  WITH OR IMMEDIATELY FOLLOWING A  MEAL Plotnikov, Aleksei V, MD  Active   Multiple Vitamin (MULTIVITAMIN) tablet [id number] Yes Take 1 tablet by mouth daily. [provider]  Active Nursing Home Medication Administration Guide (MAG)  potassium chloride  SA (KLOR-CON  M) 20 MEQ tablet [id number] Yes TAKE 1 TABLET BY MOUTH DAILY Plotnikov, Aleksei V, MD  Active   repaglinide  (PRANDIN ) 2 MG tablet [id number] unknown TAKE 2 TABLETS BY MOUTH 3 TIMES  DAILY BEFORE MEALS Plotnikov, Karlynn GAILS, MD  Active  Rivaroxaban  (XARELTO ) 15 MG TABS tablet [id number] unknown Take 1 tablet (15 mg total) by mouth daily with supper. Lesia Ozell Barter, PA-C  Active   rosuvastatin  (CRESTOR ) 10 MG tablet [id number] Yes TAKE 1 TABLET BY MOUTH UP TO 3  TIMES WEEKLY AS TOLERATED Tillery, Bryson Gavia Andrew, PA-C  Active   sacubitril -valsartan  (ENTRESTO ) 97-103 MG  [id number] Yes Take 1 tablet by mouth 2 (two) times daily. Gonfa, Taye T, MD  Active Nursing Home Medication Administration Guide (MAG)             spironolactone  (ALDACTONE ) 25 MG tablet [id number] unknown TAKE ONE-HALF(12.5MG ) TABLET BY MOUTH DAILY Walker, Caitlin S, NP  Active   temazepam  (RESTORIL ) 15 MG capsule [id number] unknown TAKE 1 CAPSULE BY MOUTH EVERY NIGHT AT BEDTIME AS NEEDED Plotnikov, Aleksei V, MD  Active   tirzepatide  (MOUNJARO ) 2.5 MG/0.5ML Pen [id number] Yes Inject 2.5 mg into the skin once a week. Plotnikov, Aleksei V, MD  Active   tirzepatide  (MOUNJARO ) 2.5 MG/0.5ML Pen [id number] unknown Inject 2.5 mg into the skin once a week. Vannie Reche RAMAN, NP  Active   venlafaxine  XR (EFFEXOR -XR) 75 MG 24 hr capsule [id number] Yes TAKE 1 CAPSULE BY MOUTH DAILY  WITH BREAKFAST Plotnikov, Aleksei V, MD  Active               Recommendation:   PCP Follow-up Continue Current Plan of Care Take medications as prescribed Call LCSW as needed for SW support Cooperate with in home support with [facility] as scheduled  Follow Up Plan:   Telephone follow up appointment date/time:  01/01/24 at 10:00 AM   Glendia Pear  MSW, LCSW [place]/Value Based Care [facility] Licensed Clinical Social Worker Direct Dial:  [phone] Fax:  [phone] Website:  [email or web address]

## 2023-11-27 ENCOUNTER — Ambulatory Visit (HOSPITAL_BASED_OUTPATIENT_CLINIC_OR_DEPARTMENT_OTHER): Admitting: Family

## 2023-11-27 ENCOUNTER — Other Ambulatory Visit

## 2023-11-27 DIAGNOSIS — E118 Type 2 diabetes mellitus with unspecified complications: Secondary | ICD-10-CM

## 2023-11-29 ENCOUNTER — Telehealth: Payer: Self-pay

## 2023-11-29 NOTE — Telephone Encounter (Signed)
 Copied from CRM [id number]. Topic: General - Other >> Nov 29, 2023  9:10 AM Pinkey ORN wrote: Reason for CRM: Message For Garald Karlynn GAILS, MD >> Nov 29, 2023  9:14 AM Pinkey ORN wrote: Patient POA [name] Party [phone] States [facility] had not received any orders in regards to patient. She's also requesting a call back, states she has a few more concerns as well as patient needing some medication for nausea.

## 2023-12-02 ENCOUNTER — Ambulatory Visit (HOSPITAL_COMMUNITY)
Admission: EM | Admit: 2023-12-02 | Discharge: 2023-12-02 | Disposition: A | Discharge status: Home / Self Care | Attending: Internal Medicine | Admitting: Internal Medicine

## 2023-12-02 ENCOUNTER — Encounter (HOSPITAL_COMMUNITY): Payer: Self-pay | Admitting: Emergency Medicine

## 2023-12-02 DIAGNOSIS — M79672 Pain in left foot: Secondary | ICD-10-CM | POA: Insufficient documentation

## 2023-12-02 DIAGNOSIS — Z7984 Long term (current) use of oral hypoglycemic drugs: Secondary | ICD-10-CM

## 2023-12-02 DIAGNOSIS — M79671 Pain in right foot: Secondary | ICD-10-CM | POA: Insufficient documentation

## 2023-12-02 DIAGNOSIS — E1142 Type 2 diabetes mellitus with diabetic polyneuropathy: Secondary | ICD-10-CM | POA: Insufficient documentation

## 2023-12-02 LAB — COMPREHENSIVE METABOLIC PANEL WITH GFR
ALT: 27 U/L (ref 0–44)
AST: 42 U/L — ABNORMAL HIGH (ref 15–41)
Albumin: 3.8 g/dL (ref 3.5–5.0)
Alkaline Phosphatase: 71 U/L (ref 38–126)
Anion gap: 12 (ref 5–15)
BUN: 41 mg/dL — ABNORMAL HIGH (ref 8–23)
CO2: 24 mmol/L (ref 22–32)
Calcium: 9.6 mg/dL (ref 8.9–10.3)
Chloride: 96 mmol/L — ABNORMAL LOW (ref 98–111)
Creatinine, Ser: 1.86 mg/dL — ABNORMAL HIGH (ref 0.44–1.00)
GFR, Estimated: 26 mL/min — ABNORMAL LOW (ref >60)
Glucose, Bld: 120 mg/dL — ABNORMAL HIGH (ref 70–99)
Potassium: 4.5 mmol/L (ref 3.5–5.1)
Sodium: 132 mmol/L — ABNORMAL LOW (ref 135–145)
Total Bilirubin: 0.5 mg/dL (ref 0.0–1.2)
Total Protein: 8.1 g/dL (ref 6.5–8.1)

## 2023-12-02 LAB — CBC
HCT: 40.4 % (ref 36.0–46.0)
Hemoglobin: 12.8 g/dL (ref 12.0–15.0)
MCH: 30 pg (ref 26.0–34.0)
MCHC: 31.7 g/dL (ref 30.0–36.0)
MCV: 94.8 fL (ref 80.0–100.0)
Platelets: 149 K/uL — ABNORMAL LOW (ref 150–400)
RBC: 4.26 MIL/uL (ref 3.87–5.11)
RDW: 14.6 % (ref 11.5–15.5)
WBC: 9.6 K/uL (ref 4.0–10.5)
nRBC: 0 % (ref 0.0–0.2)

## 2023-12-02 LAB — POCT FASTING CBG KUC MANUAL ENTRY: POCT Glucose (KUC): 110 mg/dL — AB (ref 70–99)

## 2023-12-02 MED ORDER — LIDOCAINE 5 % EX PTCH
1.0000 | MEDICATED_PATCH | CUTANEOUS | 0 refills | Status: DC
Start: 2023-12-02 — End: 2024-01-13

## 2023-12-02 NOTE — ED Provider Notes (Addendum)
 [facility]    CSN: [id number] Arrival date & time: 12/02/23  1450      History   Chief Complaint Chief Complaint  Patient presents with   Foot Pain    HPI Jessica [name] is a 89 y.o. female.   89 year old female presents urgent care with complaints of worsening bilateral foot pain.  She reports she woke up this morning with significant pain in both feet.  She has chronic pain in both feet from neuropathy but her neuropathy pain was much more significant.  Normally she can use Salonpas roll-on which helps but today it has not.  Her family went and bought her Salonpas patches which she has on her feet now but this is still not helping.  She denies any history of vascular insufficiency in the lower extremities and denies ever having any testing done to check for vascular insufficiency.  She reports she does not really check her blood sugars at home on a regular basis.  She does not walk much due to deconditioning after a hospitalization so unsure if there is any claudication present.  She denies any open wounds or difficulty healing.   Foot Pain Pertinent negatives include no chest pain, no abdominal pain and no shortness of breath.    Past Medical History:  Diagnosis Date   Anxiety    Atrial fibrillation and flutter (HCC)    detected by PPM interrogation (mostly atrial flutter)   AV block, 2nd degree 10/2012   s/p MDT ADDRL1 pacemaker implantation 10/10/2012 by Dr Kelsie   Chronic diastolic heart failure (HCC)    Depression    GERD (gastroesophageal reflux disease)    HH (hiatus hernia)    History of pulmonary embolism 10/2008   Bilateral   Hyperlipidemia    Hypertension    Hypothyroidism    Obesity    Osteoarthritis    Peripheral neuropathy    Right leg   Presence of permanent cardiac pacemaker    Pulmonary nodule    in the lingula. Noted on pre TAVR CT. Will require follow up   Renal insufficiency 2010   S/P TAVR (transcatheter aortic valve  replacement)    Shingles 09/17/2014   right lower quadrant   Type II or unspecified type diabetes mellitus without mention of complication, not stated as uncontrolled 2009    Patient Active Problem List   Diagnosis Date Noted   UTI (urinary tract infection) 11/20/2022   Hair loss 08/14/2022   Hypokalemia 05/15/2022   Acute on chronic diastolic CHF (congestive heart failure) (HCC) 05/14/2022   ABLA (acute blood loss anemia) 04/20/2022   Elevated INR 04/20/2022   Prolonged QT interval 04/20/2022   Delirium 04/19/2022   History of bacteremia 04/18/2022   COVID-19 virus infection 04/18/2022   COPD with acute exacerbation (HCC) 04/18/2022   Dementia with behavioral disturbance (HCC) 04/18/2022   MSSA bacteremia 04/04/2022   Prosthetic valve endocarditis 04/04/2022   Pacemaker infection (HCC) 04/04/2022   Sepsis (HCC) 03/28/2022   Altered mental status 03/28/2022   Hypovolemia 03/28/2022   CRF (chronic renal failure), stage 3 (moderate) (HCC) 03/28/2022   Severe sepsis with acute organ dysfunction (HCC) 03/28/2022   DNR (do not resuscitate) 03/28/2022   Hypotension 03/28/2022   Rectal bleeding    Pure hypercholesterolemia 01/28/2021   Fall 12/22/2020   Knee contusion 12/22/2020   Eczema 09/09/2019   Vertigo 03/05/2019   S/P TAVR (transcatheter aortic valve replacement) 01/14/2019   Presence of permanent cardiac pacemaker    Pulmonary  nodule    Severe aortic stenosis    Atrial fibrillation and flutter (HCC) 07/05/2017   CHF (congestive heart failure), NYHA class II, acute on chronic, combined (HCC) 04/19/2017   DOE (dyspnea on exertion) 04/18/2017   Chronic venous insufficiency 12/08/2016   Erythema nodosum 11/10/2016   Paronychia of finger, left 04/13/2016   Left carotid artery stenosis 09/24/2014   Diarrhea 07/12/2013   Foot pain, left 03/18/2013   Gastrocnemius strain 03/18/2013   GERD (gastroesophageal reflux disease) 08/18/2011   History of pulmonary embolism 10/2008    DUODENITIS, WITHOUT HEMORRHAGE 06/29/2008   Diverticulosis of colon without hemorrhage 06/29/2008   DM type 2, controlled, with complication (HCC) 01/20/2008   Low back pain 10/15/2007   OBESITY, MORBID 07/16/2007   Grieving 05/15/2007   HLD (hyperlipidemia) 03/03/2007   Hypothyroidism 11/06/2006   Anxiety disorder 11/06/2006   CARPAL TUNNEL SYNDROME 11/06/2006   Essential hypertension 11/06/2006   HIATAL HERNIA 11/06/2006   Osteoarthritis 11/06/2006   History of endocarditis 11/06/2006    Past Surgical History:  Procedure Laterality Date   APPENDECTOMY     AV NODE ABLATION N/A 07/05/2017   Procedure: AV NODE ABLATION;  Surgeon: Kelsie Agent, MD;  Location: [facility];  Service: Cardiovascular;  Laterality: N/A;   CARPAL TUNNEL RELEASE     CATARACT EXTRACTION     CHOLECYSTECTOMY     COLONOSCOPY  2010   COLONOSCOPY WITH PROPOFOL  N/A 07/25/2021   Procedure: COLONOSCOPY WITH PROPOFOL ;  Surgeon: Shila Gustav GAILS, MD;  Location: [facility];  Service: Gastroenterology;  Laterality: N/A;   FOREARM FRACTURE SURGERY  09/17/2008   HERNIA REPAIR     INGUINAL HERNIA REPAIR     PACEMAKER INSERTION  10/10/2012   MDT ADDRL1 implanted for 2nd degree AV block by Dr Kelsie   PERMANENT PACEMAKER INSERTION N/A 10/10/2012   Procedure: PERMANENT PACEMAKER INSERTION;  Surgeon: Agent Kelsie, MD;  [facility];  Service: Cardiovascular;  Laterality: N/A;   RIGHT/LEFT HEART CATH AND CORONARY ANGIOGRAPHY N/A 12/30/2018   Procedure: RIGHT/LEFT HEART CATH AND CORONARY ANGIOGRAPHY;  Surgeon: Verlin Lonni BIRCH, MD;  Location: [facility];  Service: Cardiovascular;  Laterality: N/A;   ROTATOR CUFF REPAIR     TEE WITHOUT CARDIOVERSION N/A 01/14/2019   Procedure: TRANSESOPHAGEAL ECHOCARDIOGRAM (TEE);  Surgeon: Verlin Lonni BIRCH, MD;  Location: [facility] INVASIVE CV LAB;  Service: Open Heart Surgery;  Laterality: N/A;   TEE WITHOUT CARDIOVERSION N/A 04/05/2022   Procedure:  TRANSESOPHAGEAL ECHOCARDIOGRAM (TEE);  Surgeon: Alvan Ronal BRAVO, MD;  Location: [facility] ENDOSCOPY;  Service: Cardiovascular;  Laterality: N/A;   TRANSCATHETER AORTIC VALVE REPLACEMENT, TRANSFEMORAL N/A 01/14/2019   Procedure: TRANSCATHETER AORTIC VALVE REPLACEMENT, TRANSFEMORAL;  Surgeon: Verlin Lonni BIRCH, MD;  Location: [facility];  Service: Open Heart Surgery;  Laterality: N/A;    OB History   No obstetric history on file.      Home Medications    Prior to Admission medications   Medication Sig Start Date End Date Taking? Authorizing Provider  albuterol  (VENTOLIN  HFA) 108 (90 Base) MCG/ACT inhaler Inhale 2 puffs into the lungs every 6 (six) hours as needed for wheezing or shortness of breath. Patient not taking: Reported on 11/26/2023 04/24/22   Gonfa, Taye T, MD  allopurinol  (ZYLOPRIM ) 100 MG tablet TAKE 1 TABLET BY MOUTH DAILY 04/17/23   Plotnikov, Aleksei V, MD  aspirin  EC 81 MG tablet Take 162 mg by mouth daily. Swallow whole. Patient not taking: Reported on 11/26/2023  [provider]  b complex vitamins tablet Take 1 tablet by mouth daily.    [provider]  cefadroxil  (DURICEF) 500 MG capsule Take 1 capsule (500 mg total) by mouth 2 (two) times daily. 01/12/23 01/07/24  Vu, Constance DASEN, MD  Cholecalciferol  1000 UNITS tablet Take 1,000 Units by mouth daily.    [provider]  CINNAMON PO Take 1 capsule by mouth daily.    [provider]  empagliflozin  (JARDIANCE ) 10 MG TABS tablet Take 1 tablet (10 mg total) by mouth daily. 07/03/23   Plotnikov, Karlynn GAILS, MD  ezetimibe  (ZETIA ) 10 MG tablet TAKE 1 TABLET BY MOUTH DAILY 05/18/23   Raford Riggs, MD  furosemide  (LASIX ) 40 MG tablet TAKE 1 TABLET BY MOUTH TWICE  DAILY 10/22/23   Plotnikov, Aleksei V, MD  levothyroxine  (SYNTHROID ) 75 MCG tablet TAKE 1 TABLET BY MOUTH DAILY 02/06/23   Plotnikov, Aleksei V, MD  LORazepam  (ATIVAN ) 0.5 MG tablet TAKE 1 TABLET(0.5 MG) BY MOUTH TWICE DAILY AS NEEDED  FOR ANXIETY 09/03/23   Plotnikov, Karlynn GAILS, MD  metFORMIN  (GLUCOPHAGE ) 500 MG tablet TAKE 1 TABLET BY MOUTH TWICE  DAILY WITH MEALS 02/14/23   Plotnikov, Aleksei V, MD  metoprolol  succinate (TOPROL -XL) 50 MG 24 hr tablet TAKE 1 TABLET BY MOUTH DAILY  WITH OR IMMEDIATELY FOLLOWING A  MEAL 08/06/23   Plotnikov, Aleksei V, MD  Multiple Vitamin (MULTIVITAMIN) tablet Take 1 tablet by mouth daily.    [provider]  potassium chloride  SA (KLOR-CON  M) 20 MEQ tablet TAKE 1 TABLET BY MOUTH DAILY 11/25/23   Plotnikov, Aleksei V, MD  repaglinide  (PRANDIN ) 2 MG tablet TAKE 2 TABLETS BY MOUTH 3 TIMES  DAILY BEFORE MEALS 08/06/23   Plotnikov, Aleksei V, MD  Rivaroxaban  (XARELTO ) 15 MG TABS tablet Take 1 tablet (15 mg total) by mouth daily with supper. 10/29/23   Lesia Ozell Barter, PA-C  rosuvastatin  (CRESTOR ) 10 MG tablet TAKE 1 TABLET BY MOUTH UP TO 3  TIMES WEEKLY AS TOLERATED 08/06/23   Lesia Ozell Barter, PA-C  sacubitril -valsartan  (ENTRESTO ) 97-103 MG Take 1 tablet by mouth 2 (two) times daily. 05/01/22   Gonfa, Taye T, MD  spironolactone  (ALDACTONE ) 25 MG tablet TAKE ONE-HALF(12.5MG ) TABLET BY MOUTH DAILY 09/03/23   Walker, Caitlin S, NP  temazepam  (RESTORIL ) 15 MG capsule TAKE 1 CAPSULE BY MOUTH EVERY NIGHT AT BEDTIME AS NEEDED 09/03/23   Plotnikov, Aleksei V, MD  tirzepatide  (MOUNJARO ) 2.5 MG/0.5ML Pen Inject 2.5 mg into the skin once a week. 11/15/23   Plotnikov, Aleksei V, MD  tirzepatide  (MOUNJARO ) 2.5 MG/0.5ML Pen Inject 2.5 mg into the skin once a week. 11/16/23   Vannie Reche RAMAN, NP  venlafaxine  XR (EFFEXOR -XR) 75 MG 24 hr capsule TAKE 1 CAPSULE BY MOUTH DAILY  WITH BREAKFAST 02/15/23   Plotnikov, Aleksei V, MD    Family History Family History  Problem Relation Age of Onset   Stroke Brother 62   Coronary artery disease Mother    Heart disease Mother 80   Stroke Father 28   Multiple myeloma Brother    Heart attack Son 26       Died of MI   Heart attack Daughter 16       Died of MI    Aneurysm Daughter 40       Question cerebral   Heart disease Maternal Grandmother    Heart disease Maternal Grandfather    Peripheral vascular disease Daughter        Amputation secondary to  DM    Social History Social History   Tobacco Use   Smoking status: Never    Passive exposure: Never   Smokeless tobacco: Never  Vaping Use   Vaping status: Never Used  Substance Use Topics   Alcohol  use: No    Alcohol /week: 0.0 standard drinks of alcohol    Drug use: No     Allergies   Coreg  [carvedilol ], Relafen  [nabumetone ], Calcium , Codeine, Lipitor [atorvastatin], Pacerone  [amiodarone ], Vasotec [enalapril], Vioxx [rofecoxib], and Zocor [simvastatin]   Review of Systems Review of Systems  Constitutional:  Negative for chills and fever.  HENT:  Negative for ear pain and sore throat.   Eyes:  Negative for pain and visual disturbance.  Respiratory:  Negative for cough and shortness of breath.   Cardiovascular:  Negative for chest pain and palpitations.  Gastrointestinal:  Negative for abdominal pain and vomiting.  Genitourinary:  Negative for dysuria and hematuria.  Musculoskeletal:  Negative for arthralgias and back pain.       Bilateral foot pain  Skin:  Negative for color change and rash.  Neurological:  Negative for seizures and syncope.  All other systems reviewed and are negative.    Physical Exam Triage Vital Signs ED Triage Vitals  Encounter Vitals Group     BP 12/02/23 1634 105/66     Girls Systolic BP Percentile --      Girls Diastolic BP Percentile --      Boys Systolic BP Percentile --      Boys Diastolic BP Percentile --      Pulse Rate 12/02/23 1634 62     Resp 12/02/23 1634 18     Temp 12/02/23 1634 (!) 97.4 F (36.3 C)     Temp Source 12/02/23 1634 Oral     SpO2 12/02/23 1634 94 %     Weight --      Height --      Head Circumference --      Peak Flow --      Pain Score 12/02/23 1632 10     Pain Loc --      Pain Education --      Exclude from  Growth Chart --    No data found.  Updated Vital Signs BP 105/66   Pulse 62   Temp (!) 97.4 F (36.3 C) (Oral)   Resp 18   SpO2 94%   Visual Acuity Right Eye Distance:   Left Eye Distance:   Bilateral Distance:    Right Eye Near:   Left Eye Near:    Bilateral Near:     Physical Exam Vitals and nursing note reviewed.  Constitutional:      General: She is not in acute distress.    Appearance: She is well-developed.  HENT:     Head: Normocephalic and atraumatic.  Eyes:     Conjunctiva/sclera: Conjunctivae normal.  Cardiovascular:     Rate and Rhythm: Normal rate and regular rhythm.     Pulses: Decreased pulses.          Dorsalis pedis pulses are detected w/ Doppler on the right side and detected w/ Doppler on the left side.     Heart sounds: No murmur heard.    Comments: Pulses are biphasic on Doppler but are not palpable Pulmonary:     Effort: Pulmonary effort is normal. No respiratory distress.     Breath sounds: Normal breath sounds.  Abdominal:     Palpations: Abdomen is soft.     Tenderness: There  is no abdominal tenderness.  Musculoskeletal:        General: No swelling.     Cervical back: Neck supple.  Feet:     Comments: Bilateral feet with dependent rubor and bluish discoloration of the toes however capillary refill is maintained in the dependent position but is delayed significantly in a slightly elevated position, skin integrity is maintained Skin:    General: Skin is warm and dry.     Capillary Refill: Capillary refill takes less than 2 seconds.  Neurological:     Mental Status: She is alert.  Psychiatric:        Mood and Affect: Mood normal.      UC Treatments / Results  Labs (all labs ordered are listed, but only abnormal results are displayed) Labs Reviewed  POCT FASTING CBG [facility] MANUAL ENTRY - Abnormal; Notable for the following components:      Result Value   POCT Glucose ([facility]) 110 (*)    All other components within normal limits     EKG   Radiology No results found.  Procedures Procedures (including critical care time)  Medications Ordered in UC Medications - No data to display  Initial Impression / Assessment and Plan / UC Course  I have reviewed the triage vital signs and the nursing notes.  Pertinent labs & imaging results that were available during my care of the patient were reviewed by me and considered in my medical decision making (see chart for details).     Bilateral foot pain  Diabetic peripheral neuropathy (HCC)   Significant exacerbation of peripheral neuropathy.  Physical exam findings do show that pulses are able to be found with a Doppler but likely there is some aspect of peripheral arterial disease contributing to the pain in the lower extremities.  There is dependent rubor present and the pulses become difficult to Doppler on elevation.  Do not believe there is acute ischemia present as capillary refill is maintained.  Recommend follow-up with a vascular specialist within the next few weeks.  If symptoms worsen significantly then would recommend going to the emergency room where more advanced imaging can be performed   Blood work done today including complete blood count and complete metabolic panel.  If any abnormalities are present we will contact you.  This lab work will take about 24 to 48 hours for final results.  Can try lidocaine  patches to the feet bilateral every 24 hours to help with the pain  Blood glucose today was 110  Final Clinical Impressions(s) / UC Diagnoses   Final diagnoses:  Bilateral foot pain  Diabetic peripheral neuropathy [facility])     Discharge Instructions      Significant exacerbation of peripheral neuropathy.  Physical exam findings do show that pulses are able to be found with a Doppler but likely there is some aspect of peripheral arterial disease contributing to the pain in the lower extremities.  Recommend follow-up with a vascular specialist within  the next few weeks.  If symptoms worsen significantly then would recommend going to the emergency room where more advanced imaging can be performed  Can try lidocaine  patches to the feet bilateral every 24 hours to help with the pain.    ED Prescriptions   None    PDMP not reviewed this encounter.   Teresa Almarie LABOR, PA-C 12/02/23 1740    Teresa Almarie LABOR, [place] 12/02/23 1752

## 2023-12-02 NOTE — ED Triage Notes (Signed)
 Pt reports has neuropathy in feet and this morning pain is really bad. Reports normally the roll on Salon Pas helps with pain but not today. Family went to store and got  Salon pas patches that has on bilateral feet at time of triage. Pt using wheelchair. Denies any fall or injury.

## 2023-12-02 NOTE — Discharge Instructions (Addendum)
 Significant exacerbation of peripheral neuropathy.  Physical exam findings do show that pulses are able to be found with a Doppler but likely there is some aspect of peripheral arterial disease contributing to the pain in the lower extremities.  Recommend follow-up with a vascular specialist within the next few weeks.  If symptoms worsen significantly then would recommend going to the emergency room where more advanced imaging can be performed  Blood work done today including complete blood count and complete metabolic panel.  If any abnormalities are present we will contact you.  This lab work will take about 24 to 48 hours for final results.  Can try lidocaine  patches to the feet bilateral every 24 hours to help with the pain.  Blood glucose today was 110

## 2023-12-03 MED ORDER — ONDANSETRON HCL 4 MG PO TABS: 4.0000 mg | ORAL_TABLET | Freq: Two times a day (BID) | ORAL | 0 refills | Status: DC | PRN

## 2023-12-03 NOTE — Telephone Encounter (Signed)
 Please inform [name] that I placed the referral on 11/15/2023.  She can contact the agency.  Zofran  prescription was emailed.  Thanks

## 2023-12-04 ENCOUNTER — Telehealth: Payer: Self-pay

## 2023-12-04 ENCOUNTER — Ambulatory Visit (HOSPITAL_COMMUNITY): Payer: Self-pay

## 2023-12-04 NOTE — Telephone Encounter (Signed)
 Copied from CRM #[id number]. Topic: Clinical - Medical Advice >> Dec 04, 2023  9:39 AM Tinnie BROCKS wrote: Reason for CRM: [name] (niece on dpr) calling regarding pt [place]. She says it hurts to stand and went to urgent care. They said it was neuropathy/bad blood flow in feet and Dr. Garald has prescribed something for this before. Would like some kind of medicine called in. Urgent care prescribed patches but they were $90. She is also wondering if Prednigen is safe to take with all her other meds. [phone] (Home Phone)

## 2023-12-04 NOTE — Telephone Encounter (Signed)
 Copied from CRM [id number]. Topic: Referral - Status >> Dec 04, 2023  9:37 AM Tinnie BROCKS wrote: Reason for CRM: [name] (niece on dpr) calling to let us  know that [facility] says they had not received the referral. She is wanting to make sure this is sent in soon because Jessica [name] is not doing well taking care of herself and has neuropathy.

## 2023-12-06 ENCOUNTER — Other Ambulatory Visit: Payer: Self-pay | Admitting: Internal Medicine

## 2023-12-06 ENCOUNTER — Ambulatory Visit: Admitting: Internal Medicine

## 2023-12-06 ENCOUNTER — Telehealth: Payer: Self-pay

## 2023-12-06 ENCOUNTER — Encounter: Payer: Self-pay | Admitting: Internal Medicine

## 2023-12-06 ENCOUNTER — Ambulatory Visit: Payer: Self-pay

## 2023-12-06 VITALS — BP 109/66 | HR 92 | Temp 98.3°F | Ht 61.0 in | Wt 244.0 lb

## 2023-12-06 DIAGNOSIS — N183 Chronic kidney disease, stage 3 unspecified: Secondary | ICD-10-CM | POA: Diagnosis not present

## 2023-12-06 DIAGNOSIS — F419 Anxiety disorder, unspecified: Secondary | ICD-10-CM

## 2023-12-06 DIAGNOSIS — M10371 Gout due to renal impairment, right ankle and foot: Secondary | ICD-10-CM | POA: Diagnosis not present

## 2023-12-06 DIAGNOSIS — M109 Gout, unspecified: Secondary | ICD-10-CM | POA: Insufficient documentation

## 2023-12-06 MED ORDER — COLCHICINE 0.6 MG PO TABS: ORAL_TABLET | ORAL | 1 refills | Status: AC

## 2023-12-06 MED ORDER — CELECOXIB 200 MG PO CAPS
ORAL_CAPSULE | ORAL | 1 refills | Status: DC
Start: 2023-12-06 — End: 2024-01-13

## 2023-12-06 NOTE — Progress Notes (Signed)
 Subjective:  Patient ID: Jessica [name], female    DOB: 10-02-1933  Age: 89 y.o. MRN: [id number]  CC: Foot Pain (BL foot pain; top of foot on L and around great toe and knuckle on R; Sunday was very painful could barely walk, son took me to [facility] and was given prescription) and Medical Management of Chronic Issues (Did some lab work on 8/31 and wanted to discuss them)   HPI Jessica [name] presents for 1st MTP acute severe pain x 2 days  Outpatient Medications Prior to Visit  Medication Sig Dispense Refill   allopurinol  (ZYLOPRIM ) 100 MG tablet TAKE 1 TABLET BY MOUTH DAILY 100 tablet 2   b complex vitamins tablet Take 1 tablet by mouth daily.     cefadroxil  (DURICEF) 500 MG capsule Take 1 capsule (500 mg total) by mouth 2 (two) times daily. 60 capsule 11   Cholecalciferol  1000 UNITS tablet Take 1,000 Units by mouth daily.     CINNAMON PO Take 1 capsule by mouth daily.     empagliflozin  (JARDIANCE ) 10 MG TABS tablet Take 1 tablet (10 mg total) by mouth daily. 90 tablet 3   ezetimibe  (ZETIA ) 10 MG tablet TAKE 1 TABLET BY MOUTH DAILY 100 tablet 2   furosemide  (LASIX ) 40 MG tablet TAKE 1 TABLET BY MOUTH TWICE  DAILY 200 tablet 2   levothyroxine  (SYNTHROID ) 75 MCG tablet TAKE 1 TABLET BY MOUTH DAILY 100 tablet 2   lidocaine  (LIDODERM ) 5 % Place 1 patch onto the skin daily. Remove & Discard patch within 12 hours or as directed by MD 30 patch 0   LORazepam  (ATIVAN ) 0.5 MG tablet TAKE 1 TABLET(0.5 MG) BY MOUTH TWICE DAILY AS NEEDED FOR ANXIETY 180 tablet 1   metFORMIN  (GLUCOPHAGE ) 500 MG tablet TAKE 1 TABLET BY MOUTH TWICE  DAILY WITH MEALS 200 tablet 2   metoprolol  succinate (TOPROL -XL) 50 MG 24 hr tablet TAKE 1 TABLET BY MOUTH DAILY  WITH OR IMMEDIATELY FOLLOWING A  MEAL 100 tablet 2   Multiple Vitamin (MULTIVITAMIN) tablet Take 1 tablet by mouth daily.     ondansetron  (ZOFRAN ) 4 MG tablet Take 1 tablet (4 mg total) by mouth 2 (two) times daily as needed for nausea or vomiting. 20 tablet 0    potassium chloride  SA (KLOR-CON  M) 20 MEQ tablet TAKE 1 TABLET BY MOUTH DAILY 100 tablet 2   repaglinide  (PRANDIN ) 2 MG tablet TAKE 2 TABLETS BY MOUTH 3 TIMES  DAILY BEFORE MEALS 600 tablet 2   Rivaroxaban  (XARELTO ) 15 MG TABS tablet Take 1 tablet (15 mg total) by mouth daily with supper. 30 tablet 0   rosuvastatin  (CRESTOR ) 10 MG tablet TAKE 1 TABLET BY MOUTH UP TO 3  TIMES WEEKLY AS TOLERATED 43 tablet 2   sacubitril -valsartan  (ENTRESTO ) 97-103 MG Take 1 tablet by mouth 2 (two) times daily. 180 tablet 3   spironolactone  (ALDACTONE ) 25 MG tablet TAKE ONE-HALF(12.5MG ) TABLET BY MOUTH DAILY 100 tablet 1   temazepam  (RESTORIL ) 15 MG capsule TAKE 1 CAPSULE BY MOUTH EVERY NIGHT AT BEDTIME AS NEEDED 90 capsule 1   tirzepatide  (MOUNJARO ) 2.5 MG/0.5ML Pen Inject 2.5 mg into the skin once a week. 2 mL 3   tirzepatide  (MOUNJARO ) 2.5 MG/0.5ML Pen Inject 2.5 mg into the skin once a week. 2 mL 0   venlafaxine  XR (EFFEXOR -XR) 75 MG 24 hr capsule TAKE 1 CAPSULE BY MOUTH DAILY  WITH BREAKFAST 100 capsule 2   albuterol  (VENTOLIN  HFA) 108 (90 Base) MCG/ACT inhaler Inhale 2  puffs into the lungs every 6 (six) hours as needed for wheezing or shortness of breath. (Patient not taking: Reported on 12/06/2023) 8 g 2   aspirin  EC 81 MG tablet Take 162 mg by mouth daily. Swallow whole. (Patient not taking: Reported on 12/06/2023)     No facility-administered medications prior to visit.    ROS: Review of Systems  Constitutional:  Negative for activity change, appetite change, chills, fatigue and unexpected weight change.  HENT:  Negative for congestion, mouth sores and sinus pressure.   Eyes:  Negative for visual disturbance.  Respiratory:  Negative for cough and chest tightness.   Gastrointestinal:  Negative for abdominal pain and nausea.  Genitourinary:  Negative for difficulty urinating, frequency and vaginal pain.  Musculoskeletal:  Positive for arthralgias, back pain and gait problem.  Skin:  Negative for pallor  and rash.  Neurological:  Negative for dizziness, tremors, weakness, numbness and headaches.  Psychiatric/Behavioral:  Negative for confusion, sleep disturbance and suicidal ideas.     Objective:  BP 109/66   Pulse 92   Temp 98.3 F (36.8 C)   Ht 5' 1 (1.549 m)   Wt 244 lb (110.7 kg)   SpO2 94%   BMI 46.10 kg/m   BP Readings from Last 3 Encounters:  12/06/23 109/66  12/02/23 105/66  11/16/23 (!) 102/58    Wt Readings from Last 3 Encounters:  12/06/23 244 lb (110.7 kg)  11/16/23 224 lb (101.6 kg)  11/15/23 224 lb (101.6 kg)    Physical Exam Constitutional:      General: She is not in acute distress.    Appearance: She is well-developed. She is obese.  HENT:     Head: Normocephalic.     Right Ear: External ear normal.     Left Ear: External ear normal.     Nose: Nose normal.  Eyes:     General:        Right eye: No discharge.        Left eye: No discharge.     Conjunctiva/sclera: Conjunctivae normal.     Pupils: Pupils are equal, round, and reactive to light.  Neck:     Thyroid : No thyromegaly.     Vascular: No JVD.     Trachea: No tracheal deviation.  Cardiovascular:     Rate and Rhythm: Normal rate and regular rhythm.     Heart sounds: Normal heart sounds.  Pulmonary:     Effort: No respiratory distress.     Breath sounds: No stridor. No wheezing.  Abdominal:     General: Bowel sounds are normal. There is no distension.     Palpations: Abdomen is soft. There is no mass.     Tenderness: There is no abdominal tenderness. There is no guarding or rebound.  Musculoskeletal:        General: Tenderness present.     Cervical back: Normal range of motion and neck supple. No rigidity.     Right lower leg: No edema.     Left lower leg: No edema.  Lymphadenopathy:     Cervical: No cervical adenopathy.  Skin:    Findings: No erythema or rash.  Neurological:     Cranial Nerves: No cranial nerve deficit.     Motor: No abnormal muscle tone.     Coordination:  Coordination abnormal.     Deep Tendon Reflexes: Reflexes normal.  Psychiatric:        Behavior: Behavior normal.        Thought Content:  Thought content normal.        Judgment: Judgment normal.     Lab Results  Component Value Date   WBC 9.6 12/02/2023   HGB 12.8 12/02/2023   HCT 40.4 12/02/2023   PLT 149 (L) 12/02/2023   GLUCOSE 120 (H) 12/02/2023   CHOL 207 (H) 07/12/2020   TRIG 277 (H) 07/12/2020   HDL 39 (L) 07/12/2020   LDLDIRECT 155.9 09/30/2012   LDLCALC 119 (H) 07/12/2020   ALT 27 12/02/2023   AST 42 (H) 12/02/2023   NA 132 (L) 12/02/2023   K 4.5 12/02/2023   CL 96 (L) 12/02/2023   CREATININE 1.86 (H) 12/02/2023   BUN 41 (H) 12/02/2023   CO2 24 12/02/2023   TSH 2.41 08/17/2023   INR 3.9 (H) 04/25/2022   HGBA1C 9.9 (H) 08/17/2023   MICROALBUR 4.5 (H) 12/02/2009    No results found.  Assessment & Plan:   Problem List Items Addressed This Visit     Anxiety disorder   Chronic  Continue on lorazepam  prn  Potential benefits of a long term prn benzodiazepines  use as well as potential risks  and complications were explained to the patient and were aknowledged.      CRF (chronic renal failure), stage 3 (moderate) (HCC)   Monitor GFR Hydrate well      Gout attack - Primary   Acute On Allopurinol  Start PRN Colchicine ; Celebrex  short term prn w/caution         Meds ordered this encounter  Medications   colchicine  0.6 MG tablet    Sig: Take two tablets prn gout attack.Then take another one in 1-2 hrs. Do not repeat for 3 days.    Dispense:  18 tablet    Refill:  1   celecoxib  (CELEBREX ) 200 MG capsule    Sig: Take one po bid pc prn gout for 1-2 days    Dispense:  20 capsule    Refill:  1      Follow-up: No follow-ups on file.  Marolyn Noel, MD

## 2023-12-06 NOTE — Assessment & Plan Note (Signed)
 Acute On Allopurinol  Start PRN Colchicine ; Celebrex  short term prn w/caution

## 2023-12-06 NOTE — Assessment & Plan Note (Signed)
 Chronic  ?Continue on lorazepam prn ? Potential benefits of a long term prn benzodiazepines  use as well as potential risks  and complications were explained to the patient and were aknowledged. ?

## 2023-12-06 NOTE — Telephone Encounter (Signed)
 Jessica [name] called back and advised that the patient's grand daughter Jessica [name] doesn't have permission to contact the patient's PCP for anything. Jessica [name] is listed on the DPR  Patient had just came into the office today with Jessica [name] to see her PCP Dr Garald  This RN called CAL to verify that the granddaughter had called in reference to the patient. Jessica [name] advised that she has the patient's care taken care of and there is no need for these medications the granddaughter has requested because Dr Garald has taken care of the medications that the patient needs to take at this time.  FYI Only or Action Required?: FYI only for provider.  Patient was last seen in primary care on 12/06/2023 by Plotnikov, Karlynn GAILS, MD.  Called Nurse Triage reporting Information Only.  Triage Disposition: No disposition on file.  Patient/caregiver understands and will follow disposition?: Yes   Reason for Disposition  [1] Follow-up call to recent contact AND [2] information only call, no triage required  Answer Assessment - Initial Assessment Questions Jessica [name] called back and advised that the patient's grand daughter Jessica [name] doesn't have permission to contact the patient's PCP for anything. Jessica [name] is listed on the DPR   This RN called CAL to verify that the granddaughter had called in reference to the patient. Jessica [name] advised that she has the patient's care taken care of and there is no need for these medications the granddaughter has requested because Dr Garald has taken care of the medications that the patient needs to take at this time.  Protocols used: Information Only Call - No Triage-A-AH

## 2023-12-06 NOTE — Telephone Encounter (Signed)
 Copied from CRM [id number]. Topic: Referral - Question >> Dec 06, 2023 12:09 PM Precious C wrote: Reason for CRM: Patient's friend called regarding a referral that has not been received by [facility] They stated they have been going back and forth with [facility], who claims the referral form has not been received. Patient is requesting that the referral be re-faxed to [facility] so they can be evaluated and seen.

## 2023-12-06 NOTE — Assessment & Plan Note (Signed)
 Monitor GFR Hydrate well

## 2023-12-06 NOTE — Telephone Encounter (Signed)
 Patient's granddaughter is not listed on DPR. Called listed number and [name] (niece) answered. States she is not with the patient but Media Scan for DPR located on 08/17/23.   Prior to triage, [name] stated that she would need to call back in a few minutes.  ------------------------------------------------------  Copied from CRM #[id number]. Topic: Clinical - Medication Question >> Dec 06, 2023  2:38 PM Suzen RAMAN wrote: Reason for CRM: Patient grand daughter would like to know if she can be prescribed Lyrica or Voltaren (oral version). Caller states with patient current condition she can only have certain medication so she not sure if patient can be prescribed requested medication. Please call to advise.  Pharmacy-[id number] - [place], [place] - [address] AT [facility] [address]Grand[address]  [phone]

## 2023-12-12 ENCOUNTER — Other Ambulatory Visit: Payer: Self-pay | Admitting: Student

## 2023-12-12 NOTE — Telephone Encounter (Signed)
 Prescription refill request for Xarelto  received.  Indication:afib Last office visit:8/25 Weight:110.7  kg Age:89 Scr:1.86  8/25 CrCl:36.54  ml/min  Prescription refilled

## 2023-12-27 ENCOUNTER — Ambulatory Visit: Payer: Self-pay

## 2023-12-27 NOTE — Telephone Encounter (Signed)
 NP calling, states she saw pt yesterday and pt had cold feet that were also blue with very hard to find pulses. NP states that she did not tell her to go to the ED, nor did she attempt to call PCP yesterday. NP has not seen pt today. NP requesting that MD not use Dementia as a diagnosis for the pt, just because she is 89 does not mean she has dementia, she passed all cognition exams for me, I have removed this from her chart. NP also requesting a HH aide be assigned/ordered for pt.   Writer called [facility], advised that pt needs to go to ED for cold, blue feet. Advised that 911 could be utilized if pt does not have a ride. POH states she understands.  Copied from CRM #[id number]. Topic: Clinical - Red Word Triage >> Dec 27, 2023 11:46 AM Tinnie BROCKS wrote: Red Word that prompted transfer to Nurse Triage: Nurse practitioner from [facility](?) who saw patient on behalf of medicare is on the line. Pts feet are blue and extremely cold, unable to get pulse on feet. Reason for Disposition  Entire foot is cool or blue in comparison to other side  Protocols used: Leg Pain-A-AH

## 2023-12-28 ENCOUNTER — Telehealth

## 2023-12-28 NOTE — Telephone Encounter (Signed)
 Message was left for patient's niece [name] earlier today recommending ED if she was still having discoloration and no pulse.

## 2023-12-28 NOTE — Telephone Encounter (Signed)
 Please check with the patient or her niece [name] if it is a normal/chronic condition of her feet and legs.  If it is new, she may need to go to ER.  Schedule office visit.  Thanks

## 2023-12-31 NOTE — Telephone Encounter (Signed)
 [name], patients niece is calling after speaking to [facility]. She faxed over the orders and they are stating since they are over 64 days old they will need new orders for home health as well as the most recent office visit notes for the patient. [name] provided the fax number of [phone]. Please place ATTN: Intake department on the orders.   [name] can be reached at [phone] (home) with any questions or concerns

## 2024-01-01 ENCOUNTER — Other Ambulatory Visit: Payer: Self-pay | Admitting: Licensed Clinical Social Worker

## 2024-01-01 NOTE — Patient Instructions (Signed)
 Visit Information  Thank you for taking time to visit with me today. Please don't hesitate to contact me if I can be of assistance to you before our next scheduled appointment.  Our next appointment is by telephone on 02/18/24 at 9:30 AM   Please call the care guide team at [phone] if you need to cancel or reschedule your appointment.   Following is a copy of your care plan:   Goals Addressed             This Visit's Progress    VBCI Social Work Care Plan       Problems:   Pain issues             Depression issues; anxiety issues             Transport needs             Fall risk   CSW Clinical Goal(s):   Over the next 30 days the Patient will attend all scheduled medical appointments as evidenced by patient report and care team review of appointment completion in electronic MEDICAL RECORD NUMBER .            Over the next 30 days, patient will use coping skills learned to manage anxiety and stress issues faced AEB client report of reduced anxiety symptoms (she likes to watch golf shows, liked going to watch golf in the past, enjoys visit with niece, [name]., Listens to music)  Interventions:            Spoke with [name] Party, niece and contact for client about client needs  Reviewed medications list for EPIC           Encouraged  [name] or client to call LCSW as needed for SW support              Discussed knee pain issues             Discussed support from PCP. Dr. Garald               Discussed Home Health needs of client. [name] plans to call office of PCP and request PCP please send order (if agreed upon by PCP) to [facility] for client in home support             Discussed mood of client.             Discussed family support. Client has support from her niece, [name]. Also she has granddaughter who now stays with client. This is helpful to client.              Discussed client transport needs. [name], niece, helps client with transport needs               Discussed vision needs of client. Discussed sleeping issues. Discussed meals provided. Client has one meal delivered daily to her home                Victoriano [name] for phone call with LCSW today  Patient Goals/Self-Care Activities:  Take medications as prescribed             Attend scheduled medical appointments             Use coping skills to manage anxiety issues             Allow time to complete ADLs             Call LCSW as needed for SW support  Plan:   Telephone follow up appointment with care management team member scheduled for:  02/18/24 at 9:30 AM        Please go to [facility] [address] [place] [phone]) if you are experiencing a Mental Health or Behavioral Health Crisis or need someone to talk to.  The patient verbalized understanding of instructions, educational materials, and care plan provided today and DECLINED offer to receive copy of patient instructions, educational materials, and care plan.    Glendia Pear  MSW, LCSW [facility] [place] Licensed Clinical Social Worker Direct Dial:  [phone] Fax:  [phone] Website:  [email or web address]

## 2024-01-01 NOTE — Patient Outreach (Signed)
 Complex Care Management   Visit Note  01/01/2024  Name:  Jessica [name] MRN: [id number] DOB: 10/13/1933  Situation: Referral received for Complex Care Management related to Dementia; SDOH needs I obtained verbal consent from [name] Party, niece and contact of client.  Visit completed with [name] Party  on the phone  Background:   Past Medical History:  Diagnosis Date   Anxiety    Atrial fibrillation and flutter (HCC)    detected by PPM interrogation (mostly atrial flutter)   AV block, 2nd degree 10/2012   s/p MDT ADDRL1 pacemaker implantation 10/10/2012 by Dr Kelsie   Chronic diastolic heart failure (HCC)    Depression    GERD (gastroesophageal reflux disease)    HH (hiatus hernia)    History of pulmonary embolism 10/2008   Bilateral   Hyperlipidemia    Hypertension    Hypothyroidism    Obesity    Osteoarthritis    Peripheral neuropathy    Right leg   Presence of permanent cardiac pacemaker    Pulmonary nodule    in the lingula. Noted on pre TAVR CT. Will require follow up   Renal insufficiency 2010   S/P TAVR (transcatheter aortic valve replacement)    Shingles 09/17/2014   right lower quadrant   Type II or unspecified type diabetes mellitus without mention of complication, not stated as uncontrolled 2009    Assessment: Patient Reported Symptoms:  Cognitive Cognitive Status: Difficulties with attention and concentration Cognitive/Intellectual Conditions Management [RPT]: None reported or documented in medical history or problem list   Health Maintenance Behaviors: Stress management  Neurological Neurological Review of Symptoms: Weakness, Dizziness Neurological Management Strategies: Adequate rest, Coping strategies  HEENT HEENT Symptoms Reported: No symptoms reported HEENT Management Strategies: Coping strategies    Cardiovascular Cardiovascular Symptoms Reported: Dizziness, Fatigue Does patient have uncontrolled Hypertension?: No Cardiovascular Management  Strategies: Medication therapy, Fluid modification  Respiratory Respiratory Symptoms Reported: Shortness of breath Respiratory Management Strategies: Coping strategies  Endocrine Endocrine Symptoms Reported: Weakness or fatigue, Shortness of breath Is patient diabetic?: Yes    Gastrointestinal Gastrointestinal Symptoms Reported: No symptoms reported Gastrointestinal Management Strategies: Coping strategies    Genitourinary Genitourinary Symptoms Reported: Urgency Genitourinary Management Strategies: Incontinence garment/pad  Integumentary Integumentary Symptoms Reported: No symptoms reported Additional Integumentary Details: does sit in wheelchair often Skin Management Strategies: Coping strategies  Musculoskeletal Musculoskelatal Symptoms Reviewed: Difficulty walking, Limited mobility, Unsteady gait, Weakness Musculoskeletal Management Strategies: Coping strategies      Psychosocial Psychosocial Symptoms Reported: Sadness - if selected complete PHQ 2-9, Anxiety - if selected complete GAD, Depression - if selected complete PHQ 2-9 Behavioral Management Strategies: Coping strategies Major Change/Loss/Stressor/Fears (CP): Medical condition, self Techniques to Cope with Loss/Stress/Change: Medication, Counseling  Wheelchair use; needs some help with ADLs occasionally; Risk for Fall     01/01/2024    PHQ2-9 Depression Screening   Little interest or pleasure in doing things Several days  Feeling down, depressed, or hopeless Several days  PHQ-2 - Total Score 2  Trouble falling or staying asleep, or sleeping too much Several days  Feeling tired or having little energy Several days  Poor appetite or overeating  Several days  Feeling bad about yourself - or that you are a failure or have let yourself or your family down Several days  Trouble concentrating on things, such as reading the newspaper or watching television Several days  Moving or speaking so slowly that other people could have  noticed.  Or the opposite - being so fidgety or  restless that you have been moving around a lot more than usual Several days  Thoughts that you would be better off dead, or hurting yourself in some way Not at all  PHQ2-9 Total Score 8  If you checked off any problems, how difficult have these problems made it for you to do your work, take care of things at home, or get along with other people Somewhat difficult  Depression Interventions/Treatment Counseling    Vitals:  BP in normal range, per [name] Party  Medications Reviewed Today     Reviewed by Frances Ozell GORMAN KEN (Social Worker) on 01/01/24 at 0901  Med List Status: <None>   Medication Order Taking? Sig Documenting Provider Last Dose Status Informant  albuterol  (VENTOLIN  HFA) 108 (90 Base) MCG/ACT inhaler [id number] Not taking  Inhale 2 puffs into the lungs every 6 (six) hours as needed for wheezing or shortness of breath.  Patient not taking: Reported on 01/01/2024   Gonfa, Taye T, MD  Active Nursing Home Medication Administration Guide (MAG)  allopurinol  (ZYLOPRIM ) 100 MG tablet [id number] Yes TAKE 1 TABLET BY MOUTH DAILY Plotnikov, Aleksei V, MD  Active   aspirin  EC 81 MG tablet [id number] Not taking  Take 162 mg by mouth daily. Swallow whole.  Patient not taking: Reported on 01/01/2024   [provider]  Active Self  b complex vitamins tablet [id number] Yes Take 1 tablet by mouth daily. [provider]  Active Nursing Home Medication Administration Guide (MAG)  cefadroxil  (DURICEF) 500 MG capsule [id number] Yes Take 1 capsule (500 mg total) by mouth 2 (two) times daily. Overton Constance DASEN, MD  Active   celecoxib  (CELEBREX ) 200 MG capsule [id number] Yes Take one po bid pc prn gout for 1-2 days Plotnikov, Karlynn GAILS, MD  Active   Cholecalciferol  1000 UNITS tablet [id number] Yes Take 1,000 Units by mouth daily. [provider]  Active Nursing Home Medication Administration Guide (MAG)  CINNAMON PO [id number] Yes Take 1  capsule by mouth daily. [provider]  Active   colchicine  0.6 MG tablet [id number] Yes Take two tablets prn gout attack.Then take another one in 1-2 hrs. Do not repeat for 3 days. Plotnikov, Aleksei V, MD  Active   empagliflozin  (JARDIANCE ) 10 MG TABS tablet [id number] Yes Take 1 tablet (10 mg total) by mouth daily. Plotnikov, Aleksei V, MD  Active   ezetimibe  (ZETIA ) 10 MG tablet [id number] Yes TAKE 1 TABLET BY MOUTH DAILY Raford Riggs, MD  Active   furosemide  (LASIX ) 40 MG tablet [id number] Yes TAKE 1 TABLET BY MOUTH TWICE  DAILY Plotnikov, Aleksei V, MD  Active   levothyroxine  (SYNTHROID ) 75 MCG tablet [id number] Yes TAKE 1 TABLET BY MOUTH DAILY Plotnikov, Aleksei V, MD  Active   lidocaine  (LIDODERM ) 5 % [id number] Yes Place 1 patch onto the skin daily. Remove & Discard patch within 12 hours or as directed by MD Teresa Almarie LABOR, PA-C  Active   LORazepam  (ATIVAN ) 0.5 MG tablet [id number] Yes TAKE 1 TABLET(0.5 MG) BY MOUTH TWICE DAILY AS NEEDED FOR ANXIETY Plotnikov, Aleksei V, MD  Active   metFORMIN  (GLUCOPHAGE ) 500 MG tablet [id number] Yes TAKE 1 TABLET BY MOUTH TWICE  DAILY WITH MEALS Plotnikov, Aleksei V, MD  Active   metoprolol  succinate (TOPROL -XL) 50 MG 24 hr tablet [id number] Yes TAKE 1 TABLET BY MOUTH DAILY  WITH OR IMMEDIATELY FOLLOWING A  MEAL Plotnikov, Aleksei V, MD  Active   Multiple Vitamin (MULTIVITAMIN) tablet [id number] Yes Take 1 tablet by mouth daily. [provider]  Active Nursing Home Medication Administration Guide (MAG)  ondansetron  (ZOFRAN ) 4 MG tablet [id number] Yes Take 1 tablet (4 mg total) by mouth 2 (two) times daily as needed for nausea or vomiting. Plotnikov, Aleksei V, MD  Active   potassium chloride  SA (KLOR-CON  M) 20 MEQ tablet [id number] Yes TAKE 1 TABLET BY MOUTH DAILY Plotnikov, Aleksei V, MD  Active   repaglinide  (PRANDIN ) 2 MG tablet [id number] Yes TAKE 2 TABLETS BY MOUTH 3 TIMES  DAILY BEFORE MEALS Plotnikov, Aleksei V, MD  Active   Rivaroxaban   (XARELTO ) 15 MG TABS tablet [id number] Yes TAKE 1 TABLET(15 MG) BY MOUTH DAILY WITH SUPPER Lesia Ozell Barter, PA-C  Active   rosuvastatin  (CRESTOR ) 10 MG tablet [id number] Yes TAKE 1 TABLET BY MOUTH UP TO 3  TIMES WEEKLY AS TOLERATED Tillery, Kolby Myung Andrew, PA-C  Active   sacubitril -valsartan  (ENTRESTO ) 97-103 MG [id number] Yes Take 1 tablet by mouth 2 (two) times daily. Gonfa, Taye T, MD  Active Nursing Home Medication Administration Guide (MAG)             spironolactone  (ALDACTONE ) 25 MG tablet [id number] Yes TAKE ONE-HALF(12.5MG ) TABLET BY MOUTH DAILY Walker, Caitlin S, NP  Active   temazepam  (RESTORIL ) 15 MG capsule [id number] Yes TAKE 1 CAPSULE BY MOUTH EVERY NIGHT AT BEDTIME AS NEEDED Plotnikov, Aleksei V, MD  Active   tirzepatide  (MOUNJARO ) 2.5 MG/0.5ML Pen [id number] Yes Inject 2.5 mg into the skin once a week. Plotnikov, Aleksei V, MD  Active   tirzepatide  (MOUNJARO ) 2.5 MG/0.5ML Pen [id number] Yes Inject 2.5 mg into the skin once a week. Vannie Reche RAMAN, NP  Active   venlafaxine  XR (EFFEXOR -XR) 75 MG 24 hr capsule [id number] Yes TAKE 1 CAPSULE BY MOUTH DAILY  WITH BREAKFAST Plotnikov, Aleksei V, MD  Active              Recommendation:   PCP Follow-up Continue Current Plan of Care Allow time for ADLs completion Call LCSW as needed for SW support Participate in activities to lower stress levels (calling friends on phone, watching favorite TV shows, visiting with niece)  Follow Up Plan:   LCSW to call client on 02/18/24 a 9:30 AM   Glendia Pear  MSW, LCSW [place]/Value Based [facility] Licensed Clinical Social Worker Direct Dial:  [phone] Fax:  [phone] Website:  [email or web address]

## 2024-01-02 ENCOUNTER — Telehealth: Payer: Self-pay

## 2024-01-02 NOTE — Telephone Encounter (Signed)
 Copied from CRM [id number]. Topic: Referral - Request for Referral >> Jan 02, 2024  9:56 AM Suzen RAMAN wrote: Did the patient discuss referral with their provider in the last year? Yes (If No - schedule appointment) (If Yes - send message)  Appointment offered? No  Type of order/referral and detailed reason for visit: Home Health and diabetic, CHF, Stage 3 Kidney failure and mild dementia    Preference of office, provider, location: [facility]  If referral order, have you been seen by this specialty before? Yes, [facility] in [place],  (If Yes, this issue or another issue? When? Where?  Can we respond through MyChart? Yes   Please call [name] with update  [phone]

## 2024-01-03 ENCOUNTER — Telehealth: Payer: Self-pay

## 2024-01-03 NOTE — Patient Instructions (Signed)
 [name] - I am sorry I was unable to reach you today for our scheduled appointment. I work with Plotnikov, Karlynn GAILS, MD and am calling to support your healthcare needs. Please contact me at [phone] at your earliest convenience. I look forward to speaking with you soon.   Thank you,   [name], RN, MSN, BSN, CCM [place]  [facility], Population Health Case Manager Phone: [phone]

## 2024-01-09 NOTE — Telephone Encounter (Signed)
 Believe this referral had been placed back in August but a specific location had not been picked at that time. I have changed the referred to portion to [facility]. Will reach out to referral team tomorrow to check on status of this

## 2024-01-10 ENCOUNTER — Ambulatory Visit: Payer: Self-pay

## 2024-01-10 NOTE — Telephone Encounter (Signed)
 Okay to take Zofran  and Pepto-Bismol 30 minutes apart.  Pepto-Bismol will make stool look black. Go to ER if sick.  Thanks   FYI Only or Action Required?: FYI only for provider.  Patient was last seen in primary care on 12/06/2023 by Plotnikov, Karlynn GAILS, MD.  Called Nurse Triage reporting Nausea.  Symptoms began today.  Interventions attempted: Prescription medications: Zofran .  Symptoms are: stable.  Triage Disposition: Home Care  Patient/caregiver understands and will follow disposition?: Yes      Message from [place] C sent at 01/10/2024  1:54 PM EDT  Reason for Triage: Patient niece, called in wanting to know if she can take peptmo bismal after taking 2 zofran  , wanted to know if that is okay to take together , patient has been vomiting and constipation .SABRA Patient stated since she has vomited it has helped some   Reason for Disposition  Unexplained nausea  Answer Assessment - Initial Assessment Questions This RN spoke with the patient's niece and the patient regarding symptoms. Patient took 2 Zofran  for symptoms. Patient states symptoms have gotten better. Patient states she has had some constipation and her last full BM was 2-3 days ago. Patient offered appointment in office and declined. Patient urged to call back if symptoms worsen.    1. NAUSEA SEVERITY: How bad is the nausea? (e.g., mild, moderate, severe; dehydration, weight loss)     Denies dehydration but states she has some mild nausea  2. ONSET: When did the nausea begin?     Today around 2 pm 3. VOMITING: Any vomiting? If Yes, ask: How many times today?     1 episode of vomiting today 4. RECURRENT SYMPTOM: Have you had nausea before? If Yes, ask: When was the last time? What happened that time?     Yes 5. CAUSE: What do you think is causing the nausea?     Unsure  Protocols used: Nausea-A-AH

## 2024-01-11 ENCOUNTER — Emergency Department (HOSPITAL_COMMUNITY)

## 2024-01-11 ENCOUNTER — Inpatient Hospital Stay (HOSPITAL_COMMUNITY)
Admission: EM | Admit: 2024-01-11 | Discharge: 2024-01-13 | DRG: 871 | Disposition: A | Discharge status: Home Health | Attending: Internal Medicine | Admitting: Internal Medicine

## 2024-01-11 ENCOUNTER — Other Ambulatory Visit: Payer: Self-pay

## 2024-01-11 DIAGNOSIS — Z7985 Long-term (current) use of injectable non-insulin antidiabetic drugs: Secondary | ICD-10-CM | POA: Diagnosis not present

## 2024-01-11 DIAGNOSIS — E861 Hypovolemia: Secondary | ICD-10-CM | POA: Diagnosis not present

## 2024-01-11 DIAGNOSIS — E872 Acidosis, unspecified: Secondary | ICD-10-CM | POA: Diagnosis present

## 2024-01-11 DIAGNOSIS — I959 Hypotension, unspecified: Secondary | ICD-10-CM | POA: Diagnosis not present

## 2024-01-11 DIAGNOSIS — Z7901 Long term (current) use of anticoagulants: Secondary | ICD-10-CM | POA: Diagnosis not present

## 2024-01-11 DIAGNOSIS — N184 Chronic kidney disease, stage 4 (severe): Secondary | ICD-10-CM | POA: Diagnosis not present

## 2024-01-11 DIAGNOSIS — F39 Unspecified mood [affective] disorder: Secondary | ICD-10-CM | POA: Diagnosis present

## 2024-01-11 DIAGNOSIS — R1013 Epigastric pain: Secondary | ICD-10-CM | POA: Diagnosis not present

## 2024-01-11 DIAGNOSIS — E66813 Obesity, class 3: Secondary | ICD-10-CM | POA: Diagnosis present

## 2024-01-11 DIAGNOSIS — Z885 Allergy status to narcotic agent status: Secondary | ICD-10-CM

## 2024-01-11 DIAGNOSIS — N179 Acute kidney failure, unspecified: Secondary | ICD-10-CM | POA: Diagnosis not present

## 2024-01-11 DIAGNOSIS — N189 Chronic kidney disease, unspecified: Secondary | ICD-10-CM | POA: Diagnosis present

## 2024-01-11 DIAGNOSIS — G4733 Obstructive sleep apnea (adult) (pediatric): Secondary | ICD-10-CM | POA: Diagnosis present

## 2024-01-11 DIAGNOSIS — M109 Gout, unspecified: Secondary | ICD-10-CM | POA: Diagnosis present

## 2024-01-11 DIAGNOSIS — E1122 Type 2 diabetes mellitus with diabetic chronic kidney disease: Secondary | ICD-10-CM | POA: Diagnosis not present

## 2024-01-11 DIAGNOSIS — R1032 Left lower quadrant pain: Secondary | ICD-10-CM | POA: Diagnosis not present

## 2024-01-11 DIAGNOSIS — K5792 Diverticulitis of intestine, part unspecified, without perforation or abscess without bleeding: Secondary | ICD-10-CM | POA: Diagnosis not present

## 2024-01-11 DIAGNOSIS — R110 Nausea: Secondary | ICD-10-CM | POA: Diagnosis not present

## 2024-01-11 DIAGNOSIS — Z807 Family history of other malignant neoplasms of lymphoid, hematopoietic and related tissues: Secondary | ICD-10-CM

## 2024-01-11 DIAGNOSIS — E039 Hypothyroidism, unspecified: Secondary | ICD-10-CM | POA: Diagnosis not present

## 2024-01-11 DIAGNOSIS — Z6841 Body Mass Index (BMI) 40.0 and over, adult: Secondary | ICD-10-CM

## 2024-01-11 DIAGNOSIS — Z79890 Hormone replacement therapy (postmenopausal): Secondary | ICD-10-CM | POA: Diagnosis not present

## 2024-01-11 DIAGNOSIS — Z8249 Family history of ischemic heart disease and other diseases of the circulatory system: Secondary | ICD-10-CM

## 2024-01-11 DIAGNOSIS — I5032 Chronic diastolic (congestive) heart failure: Secondary | ICD-10-CM | POA: Diagnosis not present

## 2024-01-11 DIAGNOSIS — Z9049 Acquired absence of other specified parts of digestive tract: Secondary | ICD-10-CM

## 2024-01-11 DIAGNOSIS — Z888 Allergy status to other drugs, medicaments and biological substances status: Secondary | ICD-10-CM

## 2024-01-11 DIAGNOSIS — I517 Cardiomegaly: Secondary | ICD-10-CM | POA: Diagnosis not present

## 2024-01-11 DIAGNOSIS — Z79899 Other long term (current) drug therapy: Secondary | ICD-10-CM

## 2024-01-11 DIAGNOSIS — R6520 Severe sepsis without septic shock: Secondary | ICD-10-CM | POA: Diagnosis not present

## 2024-01-11 DIAGNOSIS — Z952 Presence of prosthetic heart valve: Secondary | ICD-10-CM

## 2024-01-11 DIAGNOSIS — I130 Hypertensive heart and chronic kidney disease with heart failure and stage 1 through stage 4 chronic kidney disease, or unspecified chronic kidney disease: Secondary | ICD-10-CM | POA: Diagnosis not present

## 2024-01-11 DIAGNOSIS — I10 Essential (primary) hypertension: Secondary | ICD-10-CM | POA: Diagnosis not present

## 2024-01-11 DIAGNOSIS — E7800 Pure hypercholesterolemia, unspecified: Secondary | ICD-10-CM | POA: Diagnosis not present

## 2024-01-11 DIAGNOSIS — R6521 Severe sepsis with septic shock: Secondary | ICD-10-CM | POA: Diagnosis not present

## 2024-01-11 DIAGNOSIS — Z66 Do not resuscitate: Secondary | ICD-10-CM | POA: Diagnosis not present

## 2024-01-11 DIAGNOSIS — Z950 Presence of cardiac pacemaker: Secondary | ICD-10-CM | POA: Diagnosis present

## 2024-01-11 DIAGNOSIS — Z7984 Long term (current) use of oral hypoglycemic drugs: Secondary | ICD-10-CM | POA: Diagnosis not present

## 2024-01-11 DIAGNOSIS — K5732 Diverticulitis of large intestine without perforation or abscess without bleeding: Secondary | ICD-10-CM | POA: Diagnosis not present

## 2024-01-11 DIAGNOSIS — E118 Type 2 diabetes mellitus with unspecified complications: Secondary | ICD-10-CM | POA: Diagnosis present

## 2024-01-11 DIAGNOSIS — J9601 Acute respiratory failure with hypoxia: Secondary | ICD-10-CM | POA: Diagnosis present

## 2024-01-11 DIAGNOSIS — E785 Hyperlipidemia, unspecified: Secondary | ICD-10-CM | POA: Diagnosis present

## 2024-01-11 DIAGNOSIS — F419 Anxiety disorder, unspecified: Secondary | ICD-10-CM | POA: Diagnosis present

## 2024-01-11 DIAGNOSIS — A419 Sepsis, unspecified organism: Secondary | ICD-10-CM | POA: Diagnosis not present

## 2024-01-11 DIAGNOSIS — Z8679 Personal history of other diseases of the circulatory system: Secondary | ICD-10-CM

## 2024-01-11 DIAGNOSIS — I4891 Unspecified atrial fibrillation: Secondary | ICD-10-CM | POA: Diagnosis not present

## 2024-01-11 DIAGNOSIS — Z86711 Personal history of pulmonary embolism: Secondary | ICD-10-CM | POA: Diagnosis present

## 2024-01-11 DIAGNOSIS — K449 Diaphragmatic hernia without obstruction or gangrene: Secondary | ICD-10-CM | POA: Diagnosis not present

## 2024-01-11 DIAGNOSIS — Z823 Family history of stroke: Secondary | ICD-10-CM

## 2024-01-11 LAB — I-STAT CHEM 8, ED
BUN: 59 mg/dL — ABNORMAL HIGH (ref 8–23)
Calcium, Ion: 1.29 mmol/L (ref 1.15–1.40)
Chloride: 98 mmol/L (ref 98–111)
Creatinine, Ser: 2.8 mg/dL — ABNORMAL HIGH (ref 0.44–1.00)
Glucose, Bld: 176 mg/dL — ABNORMAL HIGH (ref 70–99)
HCT: 45 % (ref 36.0–46.0)
Hemoglobin: 15.3 g/dL — ABNORMAL HIGH (ref 12.0–15.0)
Potassium: 5.1 mmol/L (ref 3.5–5.1)
Sodium: 134 mmol/L — ABNORMAL LOW (ref 135–145)
TCO2: 25 mmol/L (ref 22–32)

## 2024-01-11 LAB — CBC WITH DIFFERENTIAL/PLATELET
Abs Immature Granulocytes: 0.06 K/uL (ref 0.00–0.07)
Basophils Absolute: 0 K/uL (ref 0.0–0.1)
Basophils Relative: 0 %
Eosinophils Absolute: 0.1 K/uL (ref 0.0–0.5)
Eosinophils Relative: 0 %
HCT: 45.5 % (ref 36.0–46.0)
Hemoglobin: 14 g/dL (ref 12.0–15.0)
Immature Granulocytes: 0 %
Lymphocytes Relative: 17 %
Lymphs Abs: 2.3 K/uL (ref 0.7–4.0)
MCH: 29.4 pg (ref 26.0–34.0)
MCHC: 30.8 g/dL (ref 30.0–36.0)
MCV: 95.4 fL (ref 80.0–100.0)
Monocytes Absolute: 2.8 K/uL — ABNORMAL HIGH (ref 0.1–1.0)
Monocytes Relative: 20 %
Neutro Abs: 8.6 K/uL — ABNORMAL HIGH (ref 1.7–7.7)
Neutrophils Relative %: 63 %
Platelets: 194 K/uL (ref 150–400)
RBC: 4.77 MIL/uL (ref 3.87–5.11)
RDW: 14.1 % (ref 11.5–15.5)
WBC: 13.8 K/uL — ABNORMAL HIGH (ref 4.0–10.5)
nRBC: 0 % (ref 0.0–0.2)

## 2024-01-11 LAB — COMPREHENSIVE METABOLIC PANEL WITH GFR
ALT: 47 U/L — ABNORMAL HIGH (ref 0–44)
AST: 47 U/L — ABNORMAL HIGH (ref 15–41)
Albumin: 4.6 g/dL (ref 3.5–5.0)
Alkaline Phosphatase: 80 U/L (ref 38–126)
Anion gap: 15 (ref 5–15)
BUN: 62 mg/dL — ABNORMAL HIGH (ref 8–23)
CO2: 25 mmol/L (ref 22–32)
Calcium: 11 mg/dL — ABNORMAL HIGH (ref 8.9–10.3)
Chloride: 93 mmol/L — ABNORMAL LOW (ref 98–111)
Creatinine, Ser: 2.61 mg/dL — ABNORMAL HIGH (ref 0.44–1.00)
GFR, Estimated: 17 mL/min — ABNORMAL LOW (ref >60)
Glucose, Bld: 176 mg/dL — ABNORMAL HIGH (ref 70–99)
Potassium: 5.2 mmol/L — ABNORMAL HIGH (ref 3.5–5.1)
Sodium: 133 mmol/L — ABNORMAL LOW (ref 135–145)
Total Bilirubin: 0.5 mg/dL (ref 0.0–1.2)
Total Protein: 8.7 g/dL — ABNORMAL HIGH (ref 6.5–8.1)

## 2024-01-11 LAB — URINALYSIS, W/ REFLEX TO CULTURE (INFECTION SUSPECTED)
Bacteria, UA: NONE SEEN
Bilirubin Urine: NEGATIVE
Glucose, UA: >=500 mg/dL — AB
Hgb urine dipstick: NEGATIVE
Ketones, ur: NEGATIVE mg/dL
Leukocytes,Ua: NEGATIVE
Nitrite: NEGATIVE
Protein, ur: NEGATIVE mg/dL
Specific Gravity, Urine: 1.013 (ref 1.005–1.030)
pH: 5 (ref 5.0–8.0)

## 2024-01-11 LAB — PROTIME-INR
INR: 1.7 — ABNORMAL HIGH (ref 0.8–1.2)
Prothrombin Time: 20.7 s — ABNORMAL HIGH (ref 11.4–15.2)

## 2024-01-11 LAB — LIPASE, BLOOD: Lipase: 43 U/L (ref 11–51)

## 2024-01-11 LAB — I-STAT CG4 LACTIC ACID, ED
Lactic Acid, Venous: 3.5 mmol/L (ref 0.5–1.9)
Lactic Acid, Venous: 1.6 mmol/L (ref 0.5–1.9)

## 2024-01-11 LAB — CBG MONITORING, ED: Glucose-Capillary: 98 mg/dL (ref 70–99)

## 2024-01-11 MED ORDER — COLCHICINE 0.6 MG PO TABS: 0.6000 mg | ORAL_TABLET | Freq: Every day | ORAL | Status: DC | PRN

## 2024-01-11 MED ORDER — MORPHINE SULFATE (PF) 2 MG/ML IV SOLN: 2.0000 mg | INTRAVENOUS | Status: DC | PRN

## 2024-01-11 MED ORDER — SODIUM CHLORIDE 0.9 % IV BOLUS
1000.0000 mL | Freq: Once | INTRAVENOUS | Status: AC
  Administered 2024-01-11: 1000 mL via INTRAVENOUS

## 2024-01-11 MED ORDER — OXYCODONE HCL 5 MG PO TABS
5.0000 mg | ORAL_TABLET | ORAL | Status: DC | PRN
  Administered 2024-01-11: 10 mg via ORAL
  Filled 2024-01-11: qty 2

## 2024-01-11 MED ORDER — ACETAMINOPHEN 650 MG RE SUPP: 650.0000 mg | Freq: Four times a day (QID) | RECTAL | Status: DC | PRN

## 2024-01-11 MED ORDER — LORAZEPAM 0.5 MG PO TABS: 0.5000 mg | ORAL_TABLET | Freq: Four times a day (QID) | ORAL | Status: DC | PRN

## 2024-01-11 MED ORDER — PIPERACILLIN-TAZOBACTAM 3.375 G IVPB
3.3750 g | Freq: Two times a day (BID) | INTRAVENOUS | Status: DC
  Administered 2024-01-11 – 2024-01-12 (×2): 3.375 g via INTRAVENOUS
  Filled 2024-01-11 (×2): qty 50

## 2024-01-11 MED ORDER — LEVOTHYROXINE SODIUM 75 MCG PO TABS
75.0000 ug | ORAL_TABLET | Freq: Every day | ORAL | Status: DC
  Administered 2024-01-12 – 2024-01-13 (×2): 75 ug via ORAL
  Filled 2024-01-11 (×2): qty 1

## 2024-01-11 MED ORDER — ALLOPURINOL 100 MG PO TABS
100.0000 mg | ORAL_TABLET | Freq: Every day | ORAL | Status: DC
Start: 2024-01-12 — End: 2024-01-13
  Administered 2024-01-12 – 2024-01-13 (×2): 100 mg via ORAL
  Filled 2024-01-11 (×2): qty 1

## 2024-01-11 MED ORDER — SODIUM CHLORIDE 0.9 % IV SOLN: INTRAVENOUS | Status: AC

## 2024-01-11 MED ORDER — TEMAZEPAM 15 MG PO CAPS
15.0000 mg | ORAL_CAPSULE | Freq: Every evening | ORAL | Status: DC | PRN
  Administered 2024-01-11 – 2024-01-12 (×2): 15 mg via ORAL
  Filled 2024-01-11 (×2): qty 1

## 2024-01-11 MED ORDER — HYDRALAZINE HCL 20 MG/ML IJ SOLN: 10.0000 mg | INTRAMUSCULAR | Status: DC | PRN

## 2024-01-11 MED ORDER — PIPERACILLIN-TAZOBACTAM 3.375 G IVPB 30 MIN
3.3750 g | Freq: Once | INTRAVENOUS | Status: AC
  Administered 2024-01-11: 3.375 g via INTRAVENOUS
  Filled 2024-01-11: qty 50

## 2024-01-11 MED ORDER — ACETAMINOPHEN 325 MG PO TABS
650.0000 mg | ORAL_TABLET | Freq: Four times a day (QID) | ORAL | Status: DC | PRN
  Administered 2024-01-12: 650 mg via ORAL
  Filled 2024-01-11: qty 2

## 2024-01-11 MED ORDER — RIVAROXABAN 15 MG PO TABS
15.0000 mg | ORAL_TABLET | Freq: Every day | ORAL | Status: DC
  Administered 2024-01-11 – 2024-01-12 (×2): 15 mg via ORAL
  Filled 2024-01-11 (×2): qty 1

## 2024-01-11 MED ORDER — INSULIN ASPART 100 UNIT/ML IJ SOLN
0.0000 [IU] | Freq: Three times a day (TID) | INTRAMUSCULAR | Status: DC
  Administered 2024-01-12 (×2): 3 [IU] via SUBCUTANEOUS
  Administered 2024-01-13: 2 [IU] via SUBCUTANEOUS
  Filled 2024-01-11: qty 0.15

## 2024-01-11 MED ORDER — VENLAFAXINE HCL ER 37.5 MG PO CP24
75.0000 mg | ORAL_CAPSULE | Freq: Every day | ORAL | Status: DC
  Administered 2024-01-12 – 2024-01-13 (×2): 75 mg via ORAL
  Filled 2024-01-11: qty 2
  Filled 2024-01-11: qty 1

## 2024-01-11 MED ORDER — VANCOMYCIN HCL IN DEXTROSE 1-5 GM/200ML-% IV SOLN
1000.0000 mg | Freq: Once | INTRAVENOUS | Status: DC
  Administered 2024-01-11: 1000 mg via INTRAVENOUS
  Filled 2024-01-11: qty 200

## 2024-01-11 MED ORDER — ONDANSETRON 4 MG PO TBDP: 4.0000 mg | ORAL_TABLET | Freq: Four times a day (QID) | ORAL | Status: DC | PRN

## 2024-01-11 MED ORDER — ONDANSETRON HCL 4 MG/2ML IJ SOLN: 4.0000 mg | Freq: Four times a day (QID) | INTRAMUSCULAR | Status: DC | PRN

## 2024-01-11 NOTE — H&P (Signed)
 History and Physical    Patient: Jessica [name] FMW:[id number] DOB: [birth year] DOA: 01/11/2024 DOS: the patient was seen and examined on 01/11/2024 PCP: Plotnikov, Karlynn GAILS, MD  Patient coming from: Home - lives alone; NOK: [name] Party, niece, [phone]; also with neighbor, Marval Elbe, [phone]   Chief Complaint: Abdominal pain  HPI: Jessica [name] is a 89 y.o. female with medical history significant of afib, pacemaker, chronic diastolic CHF, PE, HTN, HLD, hypothyroidism, obesity, s/p TAVR, and DM who presented on 10/10 with LLQ abdominal pain and n/v. She reports that she was sick yesterday, too sick to go to the [place]emetary.  She felt nauseated, had abdominal pain like maybe she was constipated.  She is ok if she gets better or not.  She didn't get any better, didn't sleep last night.  +emesis, no blood.  No Bm, took 3 laxatives.  No fever.    ER Course:  Sepsis from diverticulitis.  BP responding after being in 70s, improving.   DNR, would want limited interventions.     Review of Systems: As mentioned in the history of present illness. All other systems reviewed and are negative. Past Medical History:  Diagnosis Date   Anxiety    Atrial fibrillation and flutter (HCC)    detected by PPM interrogation (mostly atrial flutter)   AV block, 2nd degree 10/2012   s/p MDT ADDRL1 pacemaker implantation 10/10/2012 by Dr Kelsie   Chronic diastolic heart failure (HCC)    Depression    GERD (gastroesophageal reflux disease)    HH (hiatus hernia)    History of pulmonary embolism 10/2008   Bilateral   Hyperlipidemia    Hypertension    Hypothyroidism    Obesity    Osteoarthritis    Peripheral neuropathy    Right leg   Presence of permanent cardiac pacemaker    Pulmonary nodule    in the lingula. Noted on pre TAVR CT. Will require follow up   Renal insufficiency 2010   S/P TAVR (transcatheter aortic valve replacement)    Shingles 09/17/2014   right lower quadrant    Type II or unspecified type diabetes mellitus without mention of complication, not stated as uncontrolled 2009   Past Surgical History:  Procedure Laterality Date   APPENDECTOMY     AV NODE ABLATION N/A 07/05/2017   Procedure: AV NODE ABLATION;  Surgeon: Kelsie Agent, MD;  Location: [facility];  Service: Cardiovascular;  Laterality: N/A;   CARPAL TUNNEL RELEASE     CATARACT EXTRACTION     CHOLECYSTECTOMY     COLONOSCOPY  2010   COLONOSCOPY WITH PROPOFOL  N/A 07/25/2021   Procedure: COLONOSCOPY WITH PROPOFOL ;  Surgeon: Shila Gustav GAILS, MD;  Location: [facility];  Service: Gastroenterology;  Laterality: N/A;   FOREARM FRACTURE SURGERY  09/17/2008   HERNIA REPAIR     INGUINAL HERNIA REPAIR     PACEMAKER INSERTION  10/10/2012   MDT ADDRL1 implanted for 2nd degree AV block by Dr Kelsie   PERMANENT PACEMAKER INSERTION N/A 10/10/2012   Procedure: PERMANENT PACEMAKER INSERTION;  Surgeon: Agent Kelsie, MD;  Location: [facility] CATH LAB;  Service: Cardiovascular;  Laterality: N/A;   RIGHT/LEFT HEART CATH AND CORONARY ANGIOGRAPHY N/A 12/30/2018   Procedure: RIGHT/LEFT HEART CATH AND CORONARY ANGIOGRAPHY;  Surgeon: Verlin Lonni BIRCH, MD;  Location: [facility];  Service: Cardiovascular;  Laterality: N/A;   ROTATOR CUFF REPAIR     TEE WITHOUT CARDIOVERSION N/A 01/14/2019   Procedure: TRANSESOPHAGEAL ECHOCARDIOGRAM (TEE);  Surgeon: Verlin,  Lonni BIRCH, MD;  Location: [facility];  Service: Open Heart Surgery;  Laterality: N/A;   TEE WITHOUT CARDIOVERSION N/A 04/05/2022   Procedure: TRANSESOPHAGEAL ECHOCARDIOGRAM (TEE);  Surgeon: Alvan Ronal BRAVO, MD;  Location: [facility] ENDOSCOPY;  Service: Cardiovascular;  Laterality: N/A;   TRANSCATHETER AORTIC VALVE REPLACEMENT, TRANSFEMORAL N/A 01/14/2019   Procedure: TRANSCATHETER AORTIC VALVE REPLACEMENT, TRANSFEMORAL;  Surgeon: Verlin Lonni BIRCH, MD;  Location: [facility];  Service: Open Heart Surgery;  Laterality: N/A;    Social History:  reports that she has never smoked. She has never been exposed to tobacco smoke. She has never used smokeless tobacco. She reports that she does not drink alcohol  and does not use drugs.  Allergies  Allergen Reactions   Coreg  [Carvedilol ] Other (See Comments)    Weak legs   Relafen  [Nabumetone ] Other (See Comments)    Upset stomach   Calcium  Other (See Comments)    Unknown reaction   Codeine Other (See Comments)    Unknown reaction - ?chest pain   Lipitor [Atorvastatin] Other (See Comments)    Myalgias   Pacerone  [Amiodarone ] Other (See Comments)    Hand tremors   Vasotec [Enalapril] Cough   Vioxx [Rofecoxib] Other (See Comments)    Unknown reaction   Zocor [Simvastatin] Other (See Comments)    Myalgias    Family History  Problem Relation Age of Onset   Stroke Brother 90   Coronary artery disease Mother    Heart disease Mother 4   Stroke Father 66   Multiple myeloma Brother    Heart attack Son 55       Died of MI   Heart attack Daughter 102       Died of MI   Aneurysm Daughter 40       Question cerebral   Heart disease Maternal Grandmother    Heart disease Maternal Grandfather    Peripheral vascular disease Daughter        Amputation secondary to DM    Prior to Admission medications   Medication Sig Start Date End Date Taking? Authorizing Provider  albuterol  (VENTOLIN  HFA) 108 (90 Base) MCG/ACT inhaler Inhale 2 puffs into the lungs every 6 (six) hours as needed for wheezing or shortness of breath. Patient not taking: Reported on 01/01/2024 04/24/22   Gonfa, Taye T, MD  allopurinol  (ZYLOPRIM ) 100 MG tablet TAKE 1 TABLET BY MOUTH DAILY 04/17/23   Plotnikov, Aleksei V, MD  aspirin  EC 81 MG tablet Take 162 mg by mouth daily. Swallow whole. Patient not taking: Reported on 01/01/2024    [provider]  b complex vitamins tablet Take 1 tablet by mouth daily.    [provider]  celecoxib  (CELEBREX ) 200 MG capsule Take one po bid pc prn  gout for 1-2 days 12/06/23   Plotnikov, Karlynn GAILS, MD  Cholecalciferol  1000 UNITS tablet Take 1,000 Units by mouth daily.    [provider]  CINNAMON PO Take 1 capsule by mouth daily.    [provider]  colchicine  0.6 MG tablet Take two tablets prn gout attack.Then take another one in 1-2 hrs. Do not repeat for 3 days. 12/06/23   Plotnikov, Karlynn GAILS, MD  empagliflozin  (JARDIANCE ) 10 MG TABS tablet Take 1 tablet (10 mg total) by mouth daily. 07/03/23   Plotnikov, Karlynn GAILS, MD  ezetimibe  (ZETIA ) 10 MG tablet TAKE 1 TABLET BY MOUTH DAILY 05/18/23   Raford Riggs, MD  furosemide  (LASIX ) 40 MG tablet TAKE 1 TABLET BY MOUTH TWICE  DAILY 10/22/23   Plotnikov, Aleksei V, MD  levothyroxine  (SYNTHROID ) 75 MCG tablet TAKE 1 TABLET BY MOUTH DAILY 12/07/23   Plotnikov, Aleksei V, MD  lidocaine  (LIDODERM ) 5 % Place 1 patch onto the skin daily. Remove & Discard patch within 12 hours or as directed by MD 12/02/23   Teresa Almarie LABOR, PA-C  LORazepam  (ATIVAN ) 0.5 MG tablet TAKE 1 TABLET(0.5 MG) BY MOUTH TWICE DAILY AS NEEDED FOR ANXIETY 09/03/23   Plotnikov, Aleksei V, MD  metFORMIN  (GLUCOPHAGE ) 500 MG tablet TAKE 1 TABLET BY MOUTH TWICE  DAILY WITH MEALS 12/07/23   Plotnikov, Aleksei V, MD  metoprolol  succinate (TOPROL -XL) 50 MG 24 hr tablet TAKE 1 TABLET BY MOUTH DAILY  WITH OR IMMEDIATELY FOLLOWING A  MEAL 08/06/23   Plotnikov, Aleksei V, MD  Multiple Vitamin (MULTIVITAMIN) tablet Take 1 tablet by mouth daily.    [provider]  ondansetron  (ZOFRAN ) 4 MG tablet Take 1 tablet (4 mg total) by mouth 2 (two) times daily as needed for nausea or vomiting. 12/03/23   Plotnikov, Aleksei V, MD  potassium chloride  SA (KLOR-CON  M) 20 MEQ tablet TAKE 1 TABLET BY MOUTH DAILY 11/25/23   Plotnikov, Aleksei V, MD  repaglinide  (PRANDIN ) 2 MG tablet TAKE 2 TABLETS BY MOUTH 3 TIMES  DAILY BEFORE MEALS 08/06/23   Plotnikov, Aleksei V, MD  Rivaroxaban  (XARELTO ) 15 MG TABS tablet TAKE 1 TABLET(15 MG) BY MOUTH DAILY WITH  SUPPER 12/12/23   Lesia Ozell Barter, PA-C  rosuvastatin  (CRESTOR ) 10 MG tablet TAKE 1 TABLET BY MOUTH UP TO 3  TIMES WEEKLY AS TOLERATED 08/06/23   Lesia Ozell Barter, PA-C  sacubitril -valsartan  (ENTRESTO ) 97-103 MG Take 1 tablet by mouth 2 (two) times daily. 05/01/22   Gonfa, Taye T, MD  spironolactone  (ALDACTONE ) 25 MG tablet TAKE ONE-HALF(12.5MG ) TABLET BY MOUTH DAILY 09/03/23   Walker, Caitlin S, NP  temazepam  (RESTORIL ) 15 MG capsule TAKE 1 CAPSULE BY MOUTH EVERY NIGHT AT BEDTIME AS NEEDED 09/03/23   Plotnikov, Aleksei V, MD  tirzepatide  (MOUNJARO ) 2.5 MG/0.5ML Pen Inject 2.5 mg into the skin once a week. 11/15/23   Plotnikov, Aleksei V, MD  tirzepatide  (MOUNJARO ) 2.5 MG/0.5ML Pen Inject 2.5 mg into the skin once a week. 11/16/23   Vannie Reche RAMAN, NP  venlafaxine  XR (EFFEXOR -XR) 75 MG 24 hr capsule TAKE 1 CAPSULE BY MOUTH DAILY  WITH BREAKFAST 12/07/23   Plotnikov, Karlynn GAILS, MD    Physical Exam: Vitals:   01/11/24 1318 01/11/24 1430 01/11/24 1500 01/11/24 1530  BP: (!) 90/44 104/81 (!) 96/43 (!) 120/91  Pulse: 73 66 65 66  Resp: 20 18 15 16   Temp:  98.6 F (37 C)    TempSrc:      SpO2: 91% 100% 100% 90%   General:  Appears calm and comfortable and is in NAD Eyes:  EOMI, normal lids, iris ENT:  grossly normal hearing, lips & tongue, mmm Neck:  no LAD, masses or thyromegaly Cardiovascular:  RRR. No LE edema.  Respiratory:   CTA bilaterally with no wheezes/rales/rhonchi.  Normal respiratory effort. Abdomen:  soft, NT, ND Skin:  no rash or induration seen on limited exam Musculoskeletal:  grossly normal tone BUE/BLE, good ROM, no bony abnormality Psychiatric:  grossly normal mood and affect, speech fluent and appropriate, AOx3 Neurologic:  CN 2-12 grossly intact, moves all extremities in coordinated fashion, sensation intact   Radiological Exams on Admission: Independently reviewed - see discussion in A/P where applicable  DG Chest Port 1 View Result Date: 01/11/2024  EXAM:  1 VIEW(S) XRAY OF THE CHEST 01/11/2024 01:56:00 PM COMPARISON: None available. CLINICAL HISTORY: hypotension. Per chart: Pt BIBA from home for abdominal pain and nausea x 1 day. Pt denies emesis. 8/10 pain in LLQ and epigastric area. Last bowel movement unknown but has been a while. FINDINGS: LUNGS AND PLEURA: No focal pulmonary opacity. No pulmonary edema. No pleural effusion. No pneumothorax. HEART AND MEDIASTINUM: Cardiomegaly. Post TAVR. Atherosclerotic plaque. Left chest wall dual lead pacemaker in place. Moderate hiatal hernia. BONES AND SOFT TISSUES: No acute osseous abnormality. IMPRESSION: 1. No acute cardiopulmonary process identified. 2. Cardiomegaly. Status post TAVR. Left chest wall dual-lead pacemaker. 3. Moderate hiatal hernia. Electronically signed by: Katheleen Faes MD 01/11/2024 02:09 PM EDT RP Workstation: [id number]   CT ABDOMEN PELVIS WO CONTRAST Result Date: 01/11/2024 EXAM: CT ABDOMEN AND PELVIS WITHOUT CONTRAST 01/11/2024 01:49:13 PM TECHNIQUE: CT of the abdomen and pelvis was performed without the administration of intravenous contrast. Multiplanar reformatted images are provided for review. Automated exposure control, iterative reconstruction, and/or weight-based adjustment of the mA/kV was utilized to reduce the radiation dose to as low as reasonably achievable. COMPARISON: 03/28/2022 CLINICAL HISTORY: LLQ abdominal pain; Sepsis. FINDINGS: LOWER CHEST: Post TAVR. Transvenous pacing leads partially visualized. Heavy mitral annulus calcifications. Moderate hiatal hernia. LIVER: The liver is unremarkable. GALLBLADDER AND BILE DUCTS: Post cholecystectomy. No biliary ductal dilatation. SPLEEN: No acute abnormality. PANCREAS: No acute abnormality. ADRENAL GLANDS: No acute abnormality. KIDNEYS, URETERS AND BLADDER: 3 mm peripheral calcification in the left lower pole renal collecting system. No hydronephrosis. No perinephric or periureteral stranding. Urinary bladder is unremarkable. GI AND  BOWEL: Stomach demonstrates no acute abnormality. Multiple diverticula involving descending and proximal sigmoid colon with some mild adjacent inflammatory/edematous changes in the mid descending segment, no abscess. There is no bowel obstruction. PERITONEUM AND RETROPERITONEUM: No ascites. No free air. VASCULATURE: Aorta is normal in caliber. Moderate aortoiliac calcified plaque without aneurysm. LYMPH NODES: No lymphadenopathy. REPRODUCTIVE ORGANS: Small partial calcified uterine fibroids. BONES AND SOFT TISSUES: Bilateral hip DJD. Vertebral endplate spurring at multiple levels in the lower thoracic spine. Disc and facet degenerative change L3-S1. No focal soft tissue abnormality. IMPRESSION: 1. Acute uncomplicated diverticulitis of the mid descending colon. No abscess. 2. Moderate hiatal hernia. 3. Nonobstructive left urolithiasis. Electronically signed by: Dayne Hassell MD 01/11/2024 02:08 PM EDT RP Workstation: [id number]    EKG: Independently reviewed.  Paced rhythm with rate 76; prolonged QTc 508; LVH, IVCD; nonspecific ST changes with no evidence of acute ischemia   Labs on Admission: I have personally reviewed the available labs and imaging studies at the time of the admission.  Pertinent labs:    Na++ 133 K+ 5.2 Glucose 176 BUN 62/Creatinine 2.61/GFR 17 AST 47/ALT 47 Lactate 3.5, 1.6 WBC 13.8 INR 1.7 UA >500 glucose Blood cultures pending   Assessment and Plan: Principal Problem:   Sepsis due to undetermined organism [facility]) Active Problems:   CKD (chronic kidney disease) stage 4, GFR 15-29 ml/min (HCC)   Essential hypertension   DM type 2, controlled, with complication (HCC)   Hypothyroidism   DNR (do not resuscitate)   OBESITY, MORBID   HLD (hyperlipidemia)   History of pulmonary embolism   Presence of permanent cardiac pacemaker   Pure hypercholesterolemia   Diverticulitis large intestine w/o perforation or abscess w/o bleeding   Acute kidney injury superimposed on  chronic kidney disease    Sever sepsis due to diverticulitis Sepsis indicates life-threatening organ dysfunction with mortality >10%, caused by dysregulation to host response.  SIRS criteria in this patient includes Leukocytosis only - so technically not enough  Patient has evidence of acute organ failure with elevated lactate >2; recurrent hypotension (SBP < 90 or MAP < 65 x 2 readings) that is not easily explained by another condition. While awaiting blood cultures, this appears to be a preseptic condition. Sepsis protocol initiated Patient had initial SBP <90/MAP <65 and so has received the 30 cc/kg IVF bolus. Suspected source is diverticulitis and her CT supports this as a diagnosis For now, will give CLD, IVF, pain medication with morphine , nausea medication with Zofran , and treat with Zosyn for intraabdominal infection If not improving, she may need CT and/or GI/surgery consultation Blood and urine cultures pending Will admit to progressive care due to hemodynamic instability  AKI on stage 4 CKD Baseline creatinine 1.86, GFR 26 in 11/2023 Currently with creatinine 2.61, GFR 17 This is likely associated with hypovolemia in the setting of severe sepsis Avoid nephrotoxic agents Stop Celebrex  Hold Jardiance , diuretics  Afib Rate controlled with Toprol  XL but holding this for now; resume once BP is improved Continue Xarelto   Chronic diastolic CHF Appears hypovolemic Hold Lasix , Enrtesto, spironolactone , Jardiance   H/o PE On Xarelto  for afib  HTN Hold Toprol  XL in the setting of hypotension on presentation  HLD Takes Crestor  up to 3 times a week as tolerated Hold Zetia , Crestor  and resume as outpatient  Hypothyroidism Continue Synthroid   DM A1c 9.9, very poor control Hold Glucophage , Prandin , Mounjaro  Cover with moderate-scale SSI Carb modified diet   Gout Continue allopurinol , colchicine  prn  Mood d/o Continue venlafaxine , temazepam ,  lorazepam   Obesity Weight loss should be encouraged on an ongoing basis Hold Mounjaro  while inpatient Outpatient PCP/bariatric medicine f/u encouraged Significantly low or high BMI is associated with higher medical risk including morbidity and mortality   DNR I have discussed code status with the patient/family and they are in agreement that the patient would not desire resuscitation and would prefer to die a natural death should that situation arise. Patient will need a gold out of facility DNR form at the time of discharge  She is willing to treat the treatable but would not wish to escalate care      Advance Care Planning:   Code Status: Limited: Do not attempt resuscitation (DNR) -DNR-LIMITED -Do Not Intubate/DNI    Consults: None  DVT Prophylaxis: Xarelto   Family Communication: Neighbor was present throughout evaluation  Severity of Illness: The appropriate patient status for this patient is INPATIENT. Inpatient status is judged to be reasonable and necessary in order to provide the required intensity of service to ensure the patient's safety. The patient's presenting symptoms, physical exam findings, and initial radiographic and laboratory data in the context of their chronic comorbidities is felt to place them at high risk for further clinical deterioration. Furthermore, it is not anticipated that the patient will be medically stable for discharge from the hospital within 2 midnights of admission.   * I certify that at the point of admission it is my clinical judgment that the patient will require inpatient hospital care spanning beyond 2 midnights from the point of admission due to high intensity of service, high risk for further deterioration and high frequency of surveillance required.*  Author: Delon Herald, MD 01/11/2024 4:02 PM  For on call review [email or web address].

## 2024-01-11 NOTE — ED Provider Notes (Signed)
 [facility] AT [facility] Provider Note   CSN: [id number] Arrival date & time: 01/11/24  1210     Patient presents with: Abdominal Pain   Jessica [name] is a 89 y.o. female.   HPI 89 year old female presents with abdominal pain.  Symptoms started yesterday.  She has been unable to have a bowel movement since yesterday and also threw up once.  To her knowledge she has not had a fever or urinary symptoms.  No chest pain or shortness of breath.  Is having mid abdominal pain that sometimes is sharp in her left lower abdomen.  EMS was called today and her blood pressure has been in the 90s for them.  Prior to Admission medications   Medication Sig Start Date End Date Taking? Authorizing Provider  albuterol  (VENTOLIN  HFA) 108 (90 Base) MCG/ACT inhaler Inhale 2 puffs into the lungs every 6 (six) hours as needed for wheezing or shortness of breath. Patient not taking: Reported on 01/01/2024 04/24/22   Gonfa, Taye T, MD  allopurinol  (ZYLOPRIM ) 100 MG tablet TAKE 1 TABLET BY MOUTH DAILY 04/17/23   Plotnikov, Aleksei V, MD  aspirin  EC 81 MG tablet Take 162 mg by mouth daily. Swallow whole. Patient not taking: Reported on 01/01/2024    [provider]  b complex vitamins tablet Take 1 tablet by mouth daily.    [provider]  celecoxib  (CELEBREX ) 200 MG capsule Take one po bid pc prn gout for 1-2 days 12/06/23   Plotnikov, Karlynn GAILS, MD  Cholecalciferol  1000 UNITS tablet Take 1,000 Units by mouth daily.    [provider]  CINNAMON PO Take 1 capsule by mouth daily.    [provider]  colchicine  0.6 MG tablet Take two tablets prn gout attack.Then take another one in 1-2 hrs. Do not repeat for 3 days. 12/06/23   Plotnikov, Karlynn GAILS, MD  empagliflozin  (JARDIANCE ) 10 MG TABS tablet Take 1 tablet (10 mg total) by mouth daily. 07/03/23   Plotnikov, Karlynn GAILS, MD  ezetimibe  (ZETIA ) 10 MG tablet TAKE 1 TABLET BY MOUTH DAILY 05/18/23    Raford Riggs, MD  furosemide  (LASIX ) 40 MG tablet TAKE 1 TABLET BY MOUTH TWICE  DAILY 10/22/23   Plotnikov, Aleksei V, MD  levothyroxine  (SYNTHROID ) 75 MCG tablet TAKE 1 TABLET BY MOUTH DAILY 12/07/23   Plotnikov, Aleksei V, MD  lidocaine  (LIDODERM ) 5 % Place 1 patch onto the skin daily. Remove & Discard patch within 12 hours or as directed by MD 12/02/23   Teresa Almarie LABOR, PA-C  LORazepam  (ATIVAN ) 0.5 MG tablet TAKE 1 TABLET(0.5 MG) BY MOUTH TWICE DAILY AS NEEDED FOR ANXIETY 09/03/23   Plotnikov, Karlynn GAILS, MD  metFORMIN  (GLUCOPHAGE ) 500 MG tablet TAKE 1 TABLET BY MOUTH TWICE  DAILY WITH MEALS 12/07/23   Plotnikov, Aleksei V, MD  metoprolol  succinate (TOPROL -XL) 50 MG 24 hr tablet TAKE 1 TABLET BY MOUTH DAILY  WITH OR IMMEDIATELY FOLLOWING A  MEAL 08/06/23   Plotnikov, Aleksei V, MD  Multiple Vitamin (MULTIVITAMIN) tablet Take 1 tablet by mouth daily.    [provider]  ondansetron  (ZOFRAN ) 4 MG tablet Take 1 tablet (4 mg total) by mouth 2 (two) times daily as needed for nausea or vomiting. 12/03/23   Plotnikov, Aleksei V, MD  potassium chloride  SA (KLOR-CON  M) 20 MEQ tablet TAKE 1 TABLET BY MOUTH DAILY 11/25/23   Plotnikov, Aleksei V, MD  repaglinide  (PRANDIN ) 2 MG tablet TAKE 2 TABLETS BY MOUTH 3 TIMES  DAILY BEFORE MEALS 08/06/23   Plotnikov, Karlynn GAILS, MD  Rivaroxaban  (XARELTO ) 15 MG TABS tablet TAKE 1 TABLET(15 MG) BY MOUTH DAILY WITH SUPPER 12/12/23   Lesia Ozell Barter, PA-C  rosuvastatin  (CRESTOR ) 10 MG tablet TAKE 1 TABLET BY MOUTH UP TO 3  TIMES WEEKLY AS TOLERATED 08/06/23   Lesia Ozell Barter, PA-C  sacubitril -valsartan  (ENTRESTO ) 97-103 MG Take 1 tablet by mouth 2 (two) times daily. 05/01/22   Gonfa, Taye T, MD  spironolactone  (ALDACTONE ) 25 MG tablet TAKE ONE-HALF(12.5MG ) TABLET BY MOUTH DAILY 09/03/23   [facility], Caitlin S, NP  temazepam  (RESTORIL ) 15 MG capsule TAKE 1 CAPSULE BY MOUTH EVERY NIGHT AT BEDTIME AS NEEDED 09/03/23   Plotnikov, Aleksei V, MD  tirzepatide  (MOUNJARO ) 2.5  MG/0.5ML Pen Inject 2.5 mg into the skin once a week. 11/15/23   Plotnikov, Aleksei V, MD  tirzepatide  (MOUNJARO ) 2.5 MG/0.5ML Pen Inject 2.5 mg into the skin once a week. 11/16/23   Vannie Reche RAMAN, NP  venlafaxine  XR (EFFEXOR -XR) 75 MG 24 hr capsule TAKE 1 CAPSULE BY MOUTH DAILY  WITH BREAKFAST 12/07/23   Plotnikov, Aleksei V, MD    Allergies: Coreg  [carvedilol ], Relafen  [nabumetone ], Calcium , Codeine, Lipitor [atorvastatin], Pacerone  [amiodarone ], Vasotec [enalapril], Vioxx [rofecoxib], and Zocor [simvastatin]    Review of Systems  Constitutional:  Negative for fever.  Respiratory:  Negative for shortness of breath.   Cardiovascular:  Negative for chest pain.  Gastrointestinal:  Positive for abdominal pain, constipation, nausea and vomiting.  Genitourinary:  Negative for dysuria.    Updated Vital Signs BP (!) 96/43   Pulse 65   Temp 98.6 F (37 C)   Resp 15   SpO2 100%   Physical Exam Vitals and nursing note reviewed.  Constitutional:      Appearance: She is well-developed. She is not diaphoretic.  HENT:     Head: Normocephalic and atraumatic.  Cardiovascular:     Rate and Rhythm: Normal rate and regular rhythm.     Heart sounds: Normal heart sounds.  Pulmonary:     Effort: Pulmonary effort is normal.     Breath sounds: Normal breath sounds.  Abdominal:     Palpations: Abdomen is soft.     Tenderness: There is abdominal tenderness in the right lower quadrant, periumbilical area, suprapubic area and left lower quadrant.  Skin:    General: Skin is warm and dry.  Neurological:     Mental Status: She is alert.     (all labs ordered are listed, but only abnormal results are displayed) Labs Reviewed  COMPREHENSIVE METABOLIC PANEL WITH GFR - Abnormal; Notable for the following components:      Result Value   Sodium 133 (*)    Potassium 5.2 (*)    Chloride 93 (*)    Glucose, Bld 176 (*)    BUN 62 (*)    Creatinine, Ser 2.61 (*)    Calcium  11.0 (*)    Total Protein 8.7  (*)    AST 47 (*)    ALT 47 (*)    GFR, Estimated 17 (*)    All other components within normal limits  CBC WITH DIFFERENTIAL/PLATELET - Abnormal; Notable for the following components:   WBC 13.8 (*)    Neutro Abs 8.6 (*)    Monocytes Absolute 2.8 (*)    All other components within normal limits  PROTIME-INR - Abnormal; Notable for the following components:   Prothrombin Time 20.7 (*)    INR 1.7 (*)    All other components  within normal limits  URINALYSIS, W/ REFLEX TO CULTURE (INFECTION SUSPECTED) - Abnormal; Notable for the following components:   Color, Urine AMBER (*)    APPearance HAZY (*)    Glucose, UA >=500 (*)    All other components within normal limits  I-STAT CG4 LACTIC ACID, ED - Abnormal; Notable for the following components:   Lactic Acid, Venous 3.5 (*)    All other components within normal limits  I-STAT CHEM 8, ED - Abnormal; Notable for the following components:   Sodium 134 (*)    BUN 59 (*)    Creatinine, Ser 2.80 (*)    Glucose, Bld 176 (*)    Hemoglobin 15.3 (*)    All other components within normal limits  CULTURE, BLOOD (ROUTINE X 2)  CULTURE, BLOOD (ROUTINE X 2)  LIPASE, BLOOD  I-STAT CG4 LACTIC ACID, ED    EKG: EKG Interpretation Date/Time:  Friday January 11 2024 12:58:51 EDT Ventricular Rate:  76 PR Interval:  174 QRS Duration:  162 QT Interval:  451 QTC Calculation: 508 R Axis:   -74  Text Interpretation: paced rhythm Nonspecific IVCD with LAD LVH with secondary repolarization abnormality no significant change since july 2025 Confirmed by Freddi Hamilton [id number]) on 01/11/2024 1:57:48 PM  Radiology: [facility] Chest Port 1 View Result Date: 01/11/2024 EXAM: 1 VIEW(S) XRAY OF THE CHEST 01/11/2024 01:56:00 PM COMPARISON: None available. CLINICAL HISTORY: hypotension. Per chart: Pt BIBA from home for abdominal pain and nausea x 1 day. Pt denies emesis. 8/10 pain in LLQ and epigastric area. Last bowel movement unknown but has been a while. FINDINGS:  LUNGS AND PLEURA: No focal pulmonary opacity. No pulmonary edema. No pleural effusion. No pneumothorax. HEART AND MEDIASTINUM: Cardiomegaly. Post TAVR. Atherosclerotic plaque. Left chest wall dual lead pacemaker in place. Moderate hiatal hernia. BONES AND SOFT TISSUES: No acute osseous abnormality. IMPRESSION: 1. No acute cardiopulmonary process identified. 2. Cardiomegaly. Status post TAVR. Left chest wall dual-lead pacemaker. 3. Moderate hiatal hernia. Electronically signed by: Katheleen Faes MD 01/11/2024 02:09 PM EDT RP Workstation: [id number]   CT ABDOMEN PELVIS WO CONTRAST Result Date: 01/11/2024 EXAM: CT ABDOMEN AND PELVIS WITHOUT CONTRAST 01/11/2024 01:49:13 PM TECHNIQUE: CT of the abdomen and pelvis was performed without the administration of intravenous contrast. Multiplanar reformatted images are provided for review. Automated exposure control, iterative reconstruction, and/or weight-based adjustment of the mA/kV was utilized to reduce the radiation dose to as low as reasonably achievable. COMPARISON: 03/28/2022 CLINICAL HISTORY: LLQ abdominal pain; Sepsis. FINDINGS: LOWER CHEST: Post TAVR. Transvenous pacing leads partially visualized. Heavy mitral annulus calcifications. Moderate hiatal hernia. LIVER: The liver is unremarkable. GALLBLADDER AND BILE DUCTS: Post cholecystectomy. No biliary ductal dilatation. SPLEEN: No acute abnormality. PANCREAS: No acute abnormality. ADRENAL GLANDS: No acute abnormality. KIDNEYS, URETERS AND BLADDER: 3 mm peripheral calcification in the left lower pole renal collecting system. No hydronephrosis. No perinephric or periureteral stranding. Urinary bladder is unremarkable. GI AND BOWEL: Stomach demonstrates no acute abnormality. Multiple diverticula involving descending and proximal sigmoid colon with some mild adjacent inflammatory/edematous changes in the mid descending segment, no abscess. There is no bowel obstruction. PERITONEUM AND RETROPERITONEUM: No ascites. No  free air. VASCULATURE: Aorta is normal in caliber. Moderate aortoiliac calcified plaque without aneurysm. LYMPH NODES: No lymphadenopathy. REPRODUCTIVE ORGANS: Small partial calcified uterine fibroids. BONES AND SOFT TISSUES: Bilateral hip DJD. Vertebral endplate spurring at multiple levels in the lower thoracic spine. Disc and facet degenerative change L3-S1. No focal soft tissue abnormality. IMPRESSION: 1. Acute uncomplicated diverticulitis of the  mid descending colon. No abscess. 2. Moderate hiatal hernia. 3. Nonobstructive left urolithiasis. Electronically signed by: Dayne Hassell MD 01/11/2024 02:08 PM EDT RP Workstation: [id number]     .Critical Care  Performed by: Freddi Hamilton, MD Authorized by: Freddi Hamilton, MD   Critical care provider statement:    Critical care time (minutes):  40   Critical care time was exclusive of:  Separately billable procedures and treating other patients   Critical care was necessary to treat or prevent imminent or life-threatening deterioration of the following conditions:  Sepsis   Critical care was time spent personally by me on the following activities:  Development of treatment plan with patient or surrogate, discussions with consultants, evaluation of patient's response to treatment, examination of patient, ordering and review of laboratory studies, ordering and review of radiographic studies, ordering and performing treatments and interventions, pulse oximetry, re-evaluation of patient's condition and review of old charts    Medications Ordered in the ED  sodium chloride  0.9 % bolus 1,000 mL (1,000 mLs Intravenous New Bag/Given 01/11/24 1258)  piperacillin-tazobactam (ZOSYN) IVPB 3.375 g (0 g Intravenous Stopped 01/11/24 1417)  sodium chloride  0.9 % bolus 1,000 mL (0 mLs Intravenous Stopped 01/11/24 1435)                                    Medical Decision Making Amount and/or Complexity of Data Reviewed Labs: ordered.    Details: Acute on  chronic kidney injury.  Elevated lactate and leukocytosis Radiology: ordered and independent interpretation performed.    Details: Diverticulitis ECG/medicine tests: ordered and independent interpretation performed.    Details: Paced rhythm  Risk Prescription drug management. Decision regarding hospitalization.   Patient presents with hypotension and abdominal pain.  Given IV fluids and Zosyn.  However her blood pressure initially trended down despite half liter of fluids and so vancomycin  was added though this is probably abdominal.  Found to have diverticulitis.  Pressures are now better responding to fluids and have come up into the 90s and low 100s.  She confirms that she is DNR/DNI but would like treatment.  She does overall appear better with the supportive care.  Will admit to the hospitalist service, discussed with Dr. Barbarann.     Final diagnoses:  Septic shock (HCC)  Diverticulitis  Acute kidney injury    ED Discharge Orders     None          Freddi Hamilton, MD 01/11/24 [phone]

## 2024-01-11 NOTE — ED Triage Notes (Signed)
 Pt BIBA from home for abdominal pain and nausea x 1 day.  Pt denies emesis.  8/10 pain in LLQ and epigastric area.  Last bowel movement unknown but has been a while.    94/42 HR 53 95%RA CBG 220 96.7 temp

## 2024-01-11 NOTE — ED Notes (Signed)
 Per pharmacist Bard and Dr. Goldston vancomyocin stopped.

## 2024-01-11 NOTE — Sepsis Progress Note (Signed)
 eLink is following this Code Sepsis.

## 2024-01-11 NOTE — ED Notes (Signed)
 Provided patient with cranberry juice per her request.

## 2024-01-12 ENCOUNTER — Encounter (HOSPITAL_COMMUNITY): Payer: Self-pay | Admitting: Internal Medicine

## 2024-01-12 DIAGNOSIS — A419 Sepsis, unspecified organism: Secondary | ICD-10-CM | POA: Diagnosis not present

## 2024-01-12 LAB — GLUCOSE, CAPILLARY
Glucose-Capillary: 119 mg/dL — ABNORMAL HIGH (ref 70–99)
Glucose-Capillary: 164 mg/dL — ABNORMAL HIGH (ref 70–99)
Glucose-Capillary: 156 mg/dL — ABNORMAL HIGH (ref 70–99)
Glucose-Capillary: 162 mg/dL — ABNORMAL HIGH (ref 70–99)

## 2024-01-12 LAB — BASIC METABOLIC PANEL WITH GFR
Anion gap: 10 (ref 5–15)
BUN: 46 mg/dL — ABNORMAL HIGH (ref 8–23)
CO2: 23 mmol/L (ref 22–32)
Calcium: 9.3 mg/dL (ref 8.9–10.3)
Chloride: 103 mmol/L (ref 98–111)
Creatinine, Ser: 1.82 mg/dL — ABNORMAL HIGH (ref 0.44–1.00)
GFR, Estimated: 26 mL/min — ABNORMAL LOW (ref >60)
Glucose, Bld: 106 mg/dL — ABNORMAL HIGH (ref 70–99)
Potassium: 4.3 mmol/L (ref 3.5–5.1)
Sodium: 137 mmol/L (ref 135–145)

## 2024-01-12 LAB — CBC
HCT: 36.1 % (ref 36.0–46.0)
Hemoglobin: 11.2 g/dL — ABNORMAL LOW (ref 12.0–15.0)
MCH: 29.9 pg (ref 26.0–34.0)
MCHC: 31 g/dL (ref 30.0–36.0)
MCV: 96.5 fL (ref 80.0–100.0)
Platelets: 140 K/uL — ABNORMAL LOW (ref 150–400)
RBC: 3.74 MIL/uL — ABNORMAL LOW (ref 3.87–5.11)
RDW: 14.3 % (ref 11.5–15.5)
WBC: 11.8 K/uL — ABNORMAL HIGH (ref 4.0–10.5)
nRBC: 0 % (ref 0.0–0.2)

## 2024-01-12 MED ORDER — POLYETHYLENE GLYCOL 3350 17 G PO PACK
17.0000 g | PACK | Freq: Every day | ORAL | Status: DC
  Administered 2024-01-12 – 2024-01-13 (×2): 17 g via ORAL
  Filled 2024-01-12 (×2): qty 1

## 2024-01-12 MED ORDER — SACCHAROMYCES BOULARDII 250 MG PO CAPS
250.0000 mg | ORAL_CAPSULE | Freq: Two times a day (BID) | ORAL | Status: DC
  Administered 2024-01-12 – 2024-01-13 (×2): 250 mg via ORAL
  Filled 2024-01-12 (×2): qty 1

## 2024-01-12 MED ORDER — PIPERACILLIN-TAZOBACTAM 3.375 G IVPB
3.3750 g | Freq: Three times a day (TID) | INTRAVENOUS | Status: DC
  Administered 2024-01-12 – 2024-01-13 (×2): 3.375 g via INTRAVENOUS
  Filled 2024-01-12 (×2): qty 50

## 2024-01-12 MED ORDER — COLCHICINE 0.6 MG PO TABS: 0.6000 mg | ORAL_TABLET | Freq: Every day | ORAL | Status: DC | PRN

## 2024-01-12 MED ORDER — DOCUSATE SODIUM 100 MG PO CAPS
100.0000 mg | ORAL_CAPSULE | Freq: Two times a day (BID) | ORAL | Status: DC
Start: 2024-01-12 — End: 2024-01-13
  Administered 2024-01-12 – 2024-01-13 (×2): 100 mg via ORAL
  Filled 2024-01-12 (×2): qty 1

## 2024-01-12 NOTE — Progress Notes (Addendum)
 Progress Note   Patient: Jessica [name] FMW:[id number] DOB: 14-Sep-1933 DOA: 01/11/2024     1 DOS: the patient was seen and examined on 01/12/2024   Brief hospital course: 89 y.o. female with medical history significant of afib, pacemaker, chronic diastolic CHF, PE, HTN, HLD, hypothyroidism, obesity, s/p TAVR, and DM who presented on 10/10 with LLQ abdominal pain and n/v.  She was found to have sepsis from diverticulitis with fluid-responsive hypotension.  BP has normalized although she was hypoxic overnight and is on [place] O2.  DNR, would want limited interventions.  On Zosyn.  Assessment and Plan:  Severe sepsis due to diverticulitis Sepsis indicates life-threatening organ dysfunction with mortality >10%, caused by dysregulation to host response.   SIRS criteria in this patient includes leukocytosis, tachycardia, hypoxia Patient has evidence of acute organ failure with elevated lactate >2; recurrent hypotension (SBP < 90 or MAP < 65 x 2 readings) that is not easily explained by another condition Suspected source is diverticulitis and her CT supports this as a diagnosis Advance diet from clears to soft Pain medication with oxycodone  Nausea medication with Zofran  Treat with Zosyn for intraabdominal infection Blood cultures NTD Admitted to progressive care due to hemodynamic instability -> transfer to med surg with improvement Needs a good bowel regimen, will add probiotic  Acute hypoxic respiratory failure O2 sats overnight to 80% Placed on [place] O2 Resolved, back on RA Possible OSA?   AKI on stage 4 CKD Baseline creatinine 1.86, GFR 26 in 11/2023 Presenting creatinine 2.61, GFR 17 This is likely associated with hypovolemia in the setting of severe sepsis Avoid nephrotoxic agents Stop Celebrex  Hold Jardiance , diuretics Renal function has returned to baseline as of 10/11  H/o endocarditis On cefadroxil  BID indefinitely   Afib Rate controlled with Toprol  XL but holding this for  now; resume once BP is improved Continue Xarelto    Chronic diastolic CHF Appears hypovolemic Hold Lasix , Enrtesto, spironolactone , Jardiance    H/o PE On Xarelto  for afib   HTN Hold Toprol  XL in the setting of hypotension on presentation   HLD Takes Crestor  up to 3 times a week as tolerated Hold Zetia , Crestor  and resume as outpatient   Hypothyroidism Continue Synthroid    DM A1c 9.9, very poor control Hold Glucophage , Prandin , Mounjaro  Cover with moderate-scale SSI Carb modified diet    Gout Continue allopurinol  Resume colchicine  prn given improvement in renal function   Mood d/o Continue venlafaxine , temazepam , lorazepam    Morbid/class 3 obesity Body mass index is 40.81 kg/m.  Weight loss should be encouraged on an ongoing basis Hold Mounjaro  while inpatient Outpatient PCP/bariatric medicine f/u encouraged Significantly low or high BMI is associated with higher medical risk including morbidity and mortality    DNR I have discussed code status with the patient/family and they are in agreement that the patient would not desire resuscitation and would prefer to die a natural death should that situation arise. Patient will need a gold out of facility DNR form at the time of discharge  She is willing to treat the treatable but would not wish to escalate care       Consultants: None  Procedures: None  Antibiotics: Zosyn 10/10- Vancomycin  x 1  30 Day Unplanned Readmission Risk Score    Flowsheet Row ED to Hosp-Admission (Current) from 01/11/2024 in [facility] FLOOR PROGRESSIVE CARE AND UROLOGY  30 Day Unplanned Readmission Risk Score (%) 18.2 Filed at 01/12/2024 0400    This score is the patient's risk of an unplanned readmission  within 30 days of being discharged (0 -100%). The score is based on dignosis, age, lab data, medications, orders, and past utilization.   Low:  0-14.9   Medium: 15-21.9   High: 22-29.9   Extreme: 30 and above            Subjective: Mild abdominal TTP.  Still fatigued.  Better today.  Reports R great toe gout recurrence.   Objective: Vitals:   01/12/24 1437 01/12/24 1458  BP: (!) 111/52   Pulse: 72 (!) 105  Resp: 19   Temp: 98 F (36.7 C) 98.8 F (37.1 C)  SpO2: 100% 100%    Intake/Output Summary (Last 24 hours) at 01/12/2024 1500 Last data filed at 01/12/2024 1438 Gross per 24 hour  Intake 2274.46 ml  Output 850 ml  Net 1424.46 ml   Filed Weights   01/11/24 1629  Weight: 98 kg    Exam:  General:  Appears calm and comfortable and is in NAD Eyes:  EOMI, normal lids, iris ENT:  grossly normal hearing, lips & tongue, mmm Neck:  no LAD, masses or thyromegaly Cardiovascular:  RRR. No LE edema.  Respiratory:   CTA bilaterally with no wheezes/rales/rhonchi.  Normal respiratory effort. Abdomen:  soft, NT, ND Skin:  no rash or induration seen on limited exam Musculoskeletal:  grossly normal tone BUE/BLE, good ROM, no bony abnormality Psychiatric:  grossly normal mood and affect, speech fluent and appropriate, AOx3 Neurologic:  CN 2-12 grossly intact, moves all extremities in coordinated fashion, sensation intact  Data Reviewed: I have reviewed the patient's lab results since admission.  Pertinent labs for today include:   BUN 46/Creatinine 1.82/GFR 26, improved back to baseline WBC 11.8, improved from 13.8 Hgb 11.2 Platelets 140 Blood cultures pending    Family Communication: None present today; I spoke with her niece by telephone  Mobility: PT/OT Consulted      Code Status: Limited: Do not attempt resuscitation (DNR) -DNR-LIMITED -Do Not Intubate/DNI    Disposition: Status is: Inpatient Remains inpatient appropriate because: ongoing monitoring     Time spent: 50 minutes  Unresulted Labs (From admission, onward)     Start     Ordered   01/13/24 0500  CBC with Differential/Platelet  Tomorrow morning,   R       Question:  Specimen collection method  Answer:  Lab=Lab  collect   01/12/24 1500   01/13/24 0500  Basic metabolic panel with GFR  Tomorrow morning,   R       Question:  Specimen collection method  Answer:  Lab=Lab collect   01/12/24 1500             Author: Delon Herald, MD 01/12/2024 3:00 PM  For on call review [email or web address].

## 2024-01-12 NOTE — Plan of Care (Signed)

## 2024-01-12 NOTE — Evaluation (Addendum)
 Physical Therapy Evaluation Patient Details Name: Jessica [name] MRN: [id number] DOB: 04/01/1934 Today's Date: 01/12/2024  History of Present Illness  89 y.o. female with medical history significant of afib, pacemaker, chronic diastolic CHF, PE, HTN, HLD, hypothyroidism, obesity, s/p TAVR, and DM who presented on 10/10 with LLQ abdominal pain and n/v.  She was found to have sepsis from diverticulitis with fluid-responsive hypotension.  Clinical Impression  Pt admitted with above diagnosis.  Pt reports she is independent from w/c level at baseline, has niece that checks on her, assists with errands. Pt is min assist for bed mobility and transfers at this time. Will benefit from HHPT at d/c. Will follow acutely    Pt currently with functional limitations due to the deficits listed below (see PT Problem List). Pt will benefit from acute skilled PT to increase their independence and safety with mobility to allow discharge.           If plan is discharge home, recommend the following: Help with stairs or ramp for entrance;Assist for transportation   Can travel by private vehicle        Equipment Recommendations None recommended by PT  Recommendations for Other Services       Functional Status Assessment Patient has had a recent decline in their functional status and demonstrates the ability to make significant improvements in function in a reasonable and predictable amount of time.     Precautions / Restrictions Precautions Precautions: Fall      Mobility  Bed Mobility Overal bed mobility: Needs Assistance Bed Mobility: Supine to Sit     Supine to sit: Contact guard, Min assist     General bed mobility comments: light assist with trunk--pt sleeps in recliner    Transfers Overall transfer level: Needs assistance Equipment used: Rolling walker (2 wheels) Transfers: Sit to/from Stand, Bed to chair/wheelchair/BSC Sit to Stand: Min assist   Step pivot transfers:  Contact guard assist, Min assist       General transfer comment: light assist to power up to stand, CGA-min assist to steady during pivot to chair; incr time needed     Ambulation/Gait               General Gait Details: non amb at baseline  Stairs            Wheelchair Mobility     Tilt Bed    Modified Rankin (Stroke Patients Only)       Balance Overall balance assessment: Needs assistance (denies falls) Sitting-balance support: Feet supported, No upper extremity supported Sitting balance-Leahy Scale: Fair Sitting balance - Comments: not challenged   Standing balance support: During functional activity, Reliant on assistive device for balance Standing balance-Leahy Scale: Poor                               Pertinent Vitals/Pain Pain Assessment Pain Assessment: No/denies pain    Home Living Family/patient expects to be discharged to:: Private residence Living Arrangements: Other relatives Available Help at Discharge: Available PRN/intermittently Type of Home: House Home Access: Ramped entrance       Home Layout: One level Home Equipment: Agricultural consultant (2 wheels);Wheelchair - manual Additional Comments: pt's niece (her twin sister's dtr) assists with errands, groceries; pt reports her lazy grand-dtr recently moved in    Prior Function Prior Level of Function : Independent/Modified Independent             Mobility Comments: transfers  only, mobilizes in w/c; has not amb more than a few feet since 03/2022; sleeps in recliner ADLs Comments: ind ADLs, iADLs per pt report     Extremity/Trunk Assessment   Upper Extremity Assessment Upper Extremity Assessment: Overall WFL for tasks assessed    Lower Extremity Assessment Lower Extremity Assessment: RLE deficits/detail;LLE deficits/detail RLE Deficits / Details: AROM WFL, grossly 3+ to 4/5--pt reports rapid LE fatigue with activity LLE Deficits / Details: AROM WFL, grossly 3+ to  4/5--pt reports rapid LE fatigue with activity       Communication   Communication Communication: No apparent difficulties    Cognition Arousal: Alert Behavior During Therapy: WFL for tasks assessed/performed   PT - Cognitive impairments: No apparent impairments                         Following commands: Intact       Cueing Cueing Techniques: Verbal cues     General Comments      Exercises     Assessment/Plan    PT Assessment Patient needs continued PT services  PT Problem List Decreased strength;Decreased mobility;Decreased activity tolerance;Decreased balance;Decreased knowledge of use of DME       PT Treatment Interventions DME instruction;Therapeutic exercise;Functional mobility training;Therapeutic activities;Patient/family education;Balance training    PT Goals (Current goals can be found in the Care Plan section)  Acute Rehab PT Goals Patient Stated Goal: home soon PT Goal Formulation: With patient Time For Goal Achievement: 01/26/24 Potential to Achieve Goals: Good    Frequency Min 2X/week     Co-evaluation               AM-PAC PT 6 Clicks Mobility  Outcome Measure Help needed turning from your back to your side while in a flat bed without using bedrails?: A Little Help needed moving from lying on your back to sitting on the side of a flat bed without using bedrails?: A Little Help needed moving to and from a bed to a chair (including a wheelchair)?: A Little Help needed standing up from a chair using your arms (e.g., wheelchair or bedside chair)?: A Little Help needed to walk in hospital room?: A Lot Help needed climbing 3-5 steps with a railing? : A Lot 6 Click Score: 16    End of Session Equipment Utilized During Treatment: Gait belt Activity Tolerance: Patient tolerated treatment well Patient left: in chair;with chair alarm set;with call bell/phone within reach Nurse Communication: Mobility status PT Visit Diagnosis: Other  abnormalities of gait and mobility (R26.89);Unsteadiness on feet (R26.81)    Time: 8444-8380 PT Time Calculation (min) (ACUTE ONLY): 24 min   Charges:   PT Evaluation $PT Eval Low Complexity: 1 Low PT Treatments $Therapeutic Activity: 8-22 mins PT General Charges $$ ACUTE PT VISIT: 1 Visit         Cabot Cromartie, PT  Acute Rehab Dept [facility]) [phone]  01/12/2024   [facility] 01/12/2024, 5:05 PM

## 2024-01-12 NOTE — Hospital Course (Signed)
 89 y.o. female with medical history significant of afib, pacemaker, chronic diastolic CHF, PE, HTN, HLD, hypothyroidism, obesity, s/p TAVR, and DM who presented on 10/10 with LLQ abdominal pain and n/v.  She was found to have sepsis from diverticulitis with fluid-responsive hypotension.  BP has normalized although she was hypoxic overnight and is on [place] O2.  DNR, would want limited interventions.  On Zosyn.

## 2024-01-13 DIAGNOSIS — A419 Sepsis, unspecified organism: Secondary | ICD-10-CM | POA: Diagnosis not present

## 2024-01-13 LAB — BASIC METABOLIC PANEL WITH GFR
Anion gap: 11 (ref 5–15)
BUN: 25 mg/dL — ABNORMAL HIGH (ref 8–23)
CO2: 22 mmol/L (ref 22–32)
Calcium: 9.6 mg/dL (ref 8.9–10.3)
Chloride: 106 mmol/L (ref 98–111)
Creatinine, Ser: 1.21 mg/dL — ABNORMAL HIGH (ref 0.44–1.00)
GFR, Estimated: 43 mL/min — ABNORMAL LOW (ref >60)
Glucose, Bld: 111 mg/dL — ABNORMAL HIGH (ref 70–99)
Potassium: 4.5 mmol/L (ref 3.5–5.1)
Sodium: 140 mmol/L (ref 135–145)

## 2024-01-13 LAB — CBC WITH DIFFERENTIAL/PLATELET
Abs Immature Granulocytes: 0.03 K/uL (ref 0.00–0.07)
Basophils Absolute: 0.1 K/uL (ref 0.0–0.1)
Basophils Relative: 1 %
Eosinophils Absolute: 0.1 K/uL (ref 0.0–0.5)
Eosinophils Relative: 1 %
HCT: 36.9 % (ref 36.0–46.0)
Hemoglobin: 11.6 g/dL — ABNORMAL LOW (ref 12.0–15.0)
Immature Granulocytes: 0 %
Lymphocytes Relative: 29 %
Lymphs Abs: 2.7 K/uL (ref 0.7–4.0)
MCH: 30.3 pg (ref 26.0–34.0)
MCHC: 31.4 g/dL (ref 30.0–36.0)
MCV: 96.3 fL (ref 80.0–100.0)
Monocytes Absolute: 2.7 K/uL — ABNORMAL HIGH (ref 0.1–1.0)
Monocytes Relative: 29 %
Neutro Abs: 3.9 K/uL (ref 1.7–7.7)
Neutrophils Relative %: 40 %
Platelets: 147 K/uL — ABNORMAL LOW (ref 150–400)
RBC: 3.83 MIL/uL — ABNORMAL LOW (ref 3.87–5.11)
RDW: 14.5 % (ref 11.5–15.5)
WBC: 9.5 K/uL (ref 4.0–10.5)
nRBC: 0 % (ref 0.0–0.2)

## 2024-01-13 LAB — GLUCOSE, CAPILLARY: Glucose-Capillary: 144 mg/dL — ABNORMAL HIGH (ref 70–99)

## 2024-01-13 MED ORDER — AMOXICILLIN-POT CLAVULANATE 875-125 MG PO TABS
1.0000 | ORAL_TABLET | Freq: Two times a day (BID) | ORAL | 0 refills | Status: AC
Start: 2024-01-13 — End: 2024-01-20

## 2024-01-13 MED ORDER — DOCUSATE SODIUM 100 MG PO CAPS: 100.0000 mg | ORAL_CAPSULE | Freq: Two times a day (BID) | ORAL | 0 refills | Status: AC

## 2024-01-13 MED ORDER — SACCHAROMYCES BOULARDII 250 MG PO CAPS: 250.0000 mg | ORAL_CAPSULE | Freq: Two times a day (BID) | ORAL | 0 refills | Status: DC

## 2024-01-13 MED ORDER — POLYETHYLENE GLYCOL 3350 17 G PO PACK: 17.0000 g | PACK | Freq: Every day | ORAL | 0 refills | Status: DC

## 2024-01-13 NOTE — TOC Transition Note (Signed)
 Transition of Care [facility]) - Discharge Note   Patient Details  Name: Jessica [name] MRN: [id number] Date of Birth: Sep 24, 1933  [facility]) CM/SW Contact:  Tawni CHRISTELLA Eva, LCSW Phone Number: 01/13/2024, 10:45 AM   Clinical Narrative:    CSW spoke with the pt and her niece to discuss recommendations for home health services. The pt reported that she has had [facility] in the past and would like to use the same agency again. [facility] has accepted the pt for home health physical and occupational therapy. The pt's niece stated that the pt's daughter will provide transportation upon discharge. No further ICM needs. Sign off.   Final next level of care: Home w Home Health Services     Patient Goals and CMS Choice            Discharge Placement                    Patient and family notified of of transfer: 01/13/24  Discharge Plan and Services Additional resources added to the After Visit Summary for                            [facility] Arranged: PT, OT HH Agency: [facility] ([facility]) Date HH Agency Contacted: 01/13/24 Time HH Agency Contacted: 1044 Representative spoke with at [facility] Agency: [facility]  Social Drivers of Health (SDOH) Interventions SDOH Screenings   Food Insecurity: No Food Insecurity (01/11/2024)  Housing: Low Risk  (01/11/2024)  Transportation Needs: No Transportation Needs (01/11/2024)  Utilities: Not At Risk (01/11/2024)  Alcohol  Screen: Low Risk  (07/05/2023)  Depression (PHQ2-9): Medium Risk (01/01/2024)  Financial Resource Strain: Low Risk  (07/05/2023)  Physical Activity: Inactive (11/26/2023)  Social Connections: Socially Isolated (01/11/2024)  Stress: Stress Concern Present (11/26/2023)  Tobacco Use: Low Risk  (01/12/2024)  Health Literacy: Adequate Health Literacy (07/05/2023)     Readmission Risk Interventions    04/21/2022    9:30 AM  Readmission Risk Prevention Plan  Transportation Screening Complete   Medication Review (RN Care Manager) Complete  PCP or Specialist appointment within 3-5 days of discharge Complete  HRI or Home Care Consult Complete  SW Recovery Care/Counseling Consult Complete  Palliative Care Screening Not Applicable  Skilled Nursing Facility Complete

## 2024-01-13 NOTE — Plan of Care (Signed)
   Problem: Clinical Measurements: Goal: Will remain free from infection Outcome: Progressing Goal: Diagnostic test results will improve Outcome: Progressing

## 2024-01-13 NOTE — Discharge Summary (Signed)
 Physician Discharge Summary   Patient: Jessica [name] MRN: [id number] DOB: [birth year]  Admit date:     01/11/2024  Discharge date: 01/13/24  Discharge Physician: Delon Herald   PCP: Jessica Jessica GAILS, Jessica [name]   Recommendations at discharge:   You are being discharged home with physical and occupational therapy Complete antibiotics (Augmentin  twice daily for 7 more days); resume Cefadroxil  after completion of Augmentin  Stop taking Celebrex  Resume Toprol  XL on 10/13 if blood pressure is >120/70 Hold Lasix , spironolactone , potassium, and Entresto  until follow up appointment Bowel regimen is important; take Colace twice daily and Miralax  once a day with probiotic Follow up this week with Dr. Garald  Discharge Diagnoses: Principal Problem:   Sepsis due to undetermined organism [facility]) Active Problems:   CKD (chronic kidney disease) stage 4, GFR 15-29 ml/min (HCC)   Essential hypertension   DM type 2, controlled, with complication (HCC)   Hypothyroidism   DNR (do not resuscitate)   OBESITY, MORBID   HLD (hyperlipidemia)   History of pulmonary embolism   Presence of permanent cardiac pacemaker   Pure hypercholesterolemia   Diverticulitis large intestine w/o perforation or abscess w/o bleeding   Acute kidney injury superimposed on chronic kidney disease    Hospital Course: 89 y.o. female with medical history significant of afib, pacemaker, chronic diastolic CHF, PE, HTN, HLD, hypothyroidism, obesity, s/p TAVR, and DM who presented on 10/10 with LLQ abdominal pain and n/v.  She was found to have sepsis from diverticulitis with fluid-responsive hypotension.  BP has normalized although she was hypoxic overnight and is on [place] O2.  DNR, would want limited interventions.  On Zosyn.  Assessment and Plan:  Severe sepsis due to diverticulitis Sepsis indicates life-threatening organ dysfunction with mortality >10%, caused by dysregulation to host response.   SIRS criteria in this  patient includes leukocytosis, tachycardia, hypoxia Patient has evidence of acute organ failure with elevated lactate >2; recurrent hypotension (SBP < 90 or MAP < 65 x 2 readings) that is not easily explained by another condition Suspected source is diverticulitis and her CT supports this as a diagnosis Advance diet from clears to soft Pain medication with oxycodone  Nausea medication with Zofran  Treat with Zosyn for intraabdominal infection Blood cultures NTD Admitted to progressive care due to hemodynamic instability -> transfer to med surg with improvement Needs a good bowel regimen, will add probiotic   Acute hypoxic respiratory failure O2 sats overnight to 80%on 10/10 Placed on [place] O2 Resolved, back on RA Possible OSA?   AKI on stage 4 CKD Baseline creatinine 1.86, GFR 26 in 11/2023 Presenting creatinine 2.61, GFR 17 This is likely associated with hypovolemia in the setting of severe sepsis Avoid nephrotoxic agents Stop Celebrex  Resume Jardiance , diuretics Renal function has returned to baseline as of 10/11   H/o endocarditis On cefadroxil  BID indefinitely   Afib Rate controlled with Toprol  XL but holding this for now; resume once BP is improved Continue Xarelto    Chronic diastolic CHF Appears hypovolemic Hold Lasix , Enrtesto, spironolactone , Jardiance    H/o PE On Xarelto  for afib   HTN Holding Toprol  XL in the setting of hypotension on presentation   HLD Takes Crestor  up to 3 times a week as tolerated Resume Zetia , Crestor     Hypothyroidism Continue Synthroid    DM A1c 9.9, very poor control Resume Glucophage , Prandin , Mounjaro  Carb modified diet    Gout Continue allopurinol  Resume colchicine  prn given improvement in renal function   Mood d/o Continue venlafaxine , temazepam , lorazepam    Morbid/class 3  obesity Body mass index is 40.81 kg/m.  Weight loss should be encouraged on an ongoing basis Resume Mounjaro  Friday as planned Outpatient  PCP/bariatric medicine f/u encouraged Significantly low or high BMI is associated with higher medical risk including morbidity and mortality    DNR I have discussed code status with the patient/family and they are in agreement that the patient would not desire resuscitation and would prefer to die a natural death should that situation arise. Patient will need a gold out of facility DNR form at the time of discharge  She is willing to treat the treatable but would not wish to escalate care         Consultants: None   Procedures: None   Antibiotics: Zosyn 10/10- Vancomycin  x 1    Pain control - [place]  Controlled Substance Reporting System database was reviewed. and patient was instructed, not to drive, operate heavy machinery, perform activities at heights, swimming or participation in water activities or provide baby-sitting services while on Pain, Sleep and Anxiety Medications; until their outpatient Physician has advised to do so again. Also recommended to not to take more than prescribed Pain, Sleep and Anxiety Medications.   Disposition: Home Diet recommendation:  Carb modified diet DISCHARGE MEDICATION: Allergies as of 01/13/2024       Reactions   Coreg  [carvedilol ] Other (See Comments)   Weak legs   Relafen  [nabumetone ] Other (See Comments)   Upset stomach   Calcium  Other (See Comments)   Unknown reaction   Codeine Other (See Comments)   Unknown reaction - ?chest pain   Lipitor [atorvastatin] Other (See Comments)   Myalgias   Pacerone  [amiodarone ] Other (See Comments)   Hand tremors   Vasotec [enalapril] Cough   Vioxx [rofecoxib] Other (See Comments)   Unknown reaction   Zocor [simvastatin] Other (See Comments)   Myalgias        Medication List     PAUSE taking these medications    cefadroxil  500 MG capsule Wait to take this until: January 21, 2024 Commonly known as: DURICEF Take 500 mg by mouth 2 (two) times daily.   Entresto  97-103  MG Wait to take this until your doctor or other care provider tells you to start again. Generic drug: sacubitril -valsartan  Take 1 tablet by mouth 2 (two) times daily.   furosemide  40 MG tablet Wait to take this until your doctor or other care provider tells you to start again. Commonly known as: LASIX  TAKE 1 TABLET BY MOUTH TWICE  DAILY What changed:  how much to take when to take this additional instructions   potassium chloride  SA 20 MEQ tablet Wait to take this until your doctor or other care provider tells you to start again. Commonly known as: KLOR-CON  M TAKE 1 TABLET BY MOUTH DAILY   spironolactone  25 MG tablet Wait to take this until your doctor or other care provider tells you to start again. Commonly known as: ALDACTONE  TAKE ONE-HALF(12.5MG ) TABLET BY MOUTH DAILY What changed: See the new instructions.       STOP taking these medications    celecoxib  200 MG capsule Commonly known as: CeleBREX    lidocaine  5 % Commonly known as: Lidoderm        TAKE these medications    allopurinol  100 MG tablet Commonly known as: ZYLOPRIM  TAKE 1 TABLET BY MOUTH DAILY   amoxicillin -clavulanate 875-125 MG tablet Commonly known as: AUGMENTIN  Take 1 tablet by mouth 2 (two) times daily for 7 days.   b complex vitamins tablet Take  1 tablet by mouth daily.   CINNAMON PO Take 1 capsule by mouth daily.   colchicine  0.6 MG tablet Take two tablets prn gout attack.Then take another one in 1-2 hrs. Do not repeat for 3 days. What changed:  how much to take how to take this when to take this additional instructions   docusate sodium  100 MG capsule Commonly known as: COLACE Take 1 capsule (100 mg total) by mouth 2 (two) times daily.   empagliflozin  10 MG Tabs tablet Commonly known as: JARDIANCE  Take 1 tablet (10 mg total) by mouth daily.   ezetimibe  10 MG tablet Commonly known as: ZETIA  TAKE 1 TABLET BY MOUTH DAILY What changed: when to take this   levothyroxine  75  MCG tablet Commonly known as: SYNTHROID  TAKE 1 TABLET BY MOUTH DAILY What changed: when to take this   LORazepam  0.5 MG tablet Commonly known as: ATIVAN  TAKE 1 TABLET(0.5 MG) BY MOUTH TWICE DAILY AS NEEDED FOR ANXIETY What changed: See the new instructions.   metFORMIN  500 MG tablet Commonly known as: GLUCOPHAGE  TAKE 1 TABLET BY MOUTH TWICE  DAILY WITH MEALS What changed: when to take this   metoprolol  succinate 50 MG 24 hr tablet Commonly known as: TOPROL -XL TAKE 1 TABLET BY MOUTH DAILY  WITH OR IMMEDIATELY FOLLOWING A  MEAL What changed: See the new instructions.   multivitamin tablet Take 1 tablet by mouth daily with breakfast.   ondansetron  4 MG tablet Commonly known as: Zofran  Take 1 tablet (4 mg total) by mouth 2 (two) times daily as needed for nausea or vomiting.   polyethylene glycol 17 g packet Commonly known as: MIRALAX  / GLYCOLAX  Take 17 g by mouth daily. Start taking on: January 14, 2024   repaglinide  2 MG tablet Commonly known as: PRANDIN  TAKE 2 TABLETS BY MOUTH 3 TIMES  DAILY BEFORE MEALS What changed: See the new instructions.   rosuvastatin  10 MG tablet Commonly known as: CRESTOR  TAKE 1 TABLET BY MOUTH UP TO 3  TIMES WEEKLY AS TOLERATED What changed: See the new instructions.   saccharomyces boulardii 250 MG capsule Commonly known as: FLORASTOR Take 1 capsule (250 mg total) by mouth 2 (two) times daily.   SALONPAS DEEP RELIEVING EX Apply 1 patch topically daily as needed (for pain - to affected areas and remove as directed).   temazepam  15 MG capsule Commonly known as: RESTORIL  TAKE 1 CAPSULE BY MOUTH EVERY NIGHT AT BEDTIME AS NEEDED What changed: See the new instructions.   tirzepatide  2.5 MG/0.5ML Pen Commonly known as: MOUNJARO  Inject 2.5 mg into the skin once a week. What changed: when to take this   venlafaxine  XR 75 MG 24 hr capsule Commonly known as: EFFEXOR -XR TAKE 1 CAPSULE BY MOUTH DAILY  WITH BREAKFAST   Vitamin D3 1000 units  Caps Take 1,000 Units by mouth in the morning and at bedtime.   Xarelto  15 MG Tabs tablet Generic drug: Rivaroxaban  TAKE 1 TABLET(15 MG) BY MOUTH DAILY WITH SUPPER        Follow-up Information     Plotnikov, Jessica GAILS, Jessica [name]. Call in 3 day(s).   Specialty: Internal Medicine Contact information: [address] [place] [address] [phone]                Discharge Exam:   Subjective: Feeling much better, wants to go home.   Objective: Vitals:   01/13/24 0547 01/13/24 0846  BP: (!) 107/55 (!) 108/53  Pulse: 60 (!) 103  Resp: 15 16  Temp:  97.9 F (36.6 C) 97.6 F (36.4 C)  SpO2: 93% 96%    Intake/Output Summary (Last 24 hours) at 01/13/2024 1002 Last data filed at 01/13/2024 9376 Gross per 24 hour  Intake 1270.12 ml  Output 2200 ml  Net -929.88 ml   Filed Weights   01/11/24 1629  Weight: 98 kg    Exam:  General:  Appears calm and comfortable and is in NAD Eyes:  EOMI, normal lids, iris ENT:  grossly normal hearing, lips & tongue, mmm Neck:  no LAD, masses or thyromegaly Cardiovascular:  RRR. No LE edema.  Respiratory:   CTA bilaterally with no wheezes/rales/rhonchi.  Normal respiratory effort. Abdomen:  soft, NT, ND Skin:  no rash or induration seen on limited exam Musculoskeletal:  grossly normal tone BUE/BLE, good ROM, no bony abnormality Psychiatric:  grossly normal mood and affect, speech fluent and appropriate, AOx3 Neurologic:  CN 2-12 grossly intact, moves all extremities in coordinated fashion, sensation intact  Data Reviewed: I have reviewed the patient's lab results since admission.  Pertinent labs for today include:   Glucose 111 BUN 25/Creatinine 1.21/GFR 43, significantly improved WBC 9.5 Hgb 11.6, stable Platelets 147, improved Blood cultures NTD    Condition at discharge: improving  The results of significant diagnostics from this hospitalization (including imaging, microbiology, ancillary and laboratory) are  listed below for reference.   Imaging Studies: DG Chest Port 1 View Result Date: 01/11/2024 EXAM: 1 VIEW(S) XRAY OF THE CHEST 01/11/2024 01:56:00 PM COMPARISON: None available. CLINICAL HISTORY: hypotension. Per chart: Pt BIBA from home for abdominal pain and nausea x 1 day. Pt denies emesis. 8/10 pain in LLQ and epigastric area. Last bowel movement unknown but has been a while. FINDINGS: LUNGS AND PLEURA: No focal pulmonary opacity. No pulmonary edema. No pleural effusion. No pneumothorax. HEART AND MEDIASTINUM: Cardiomegaly. Post TAVR. Atherosclerotic plaque. Left chest wall dual lead pacemaker in place. Moderate hiatal hernia. BONES AND SOFT TISSUES: No acute osseous abnormality. IMPRESSION: 1. No acute cardiopulmonary process identified. 2. Cardiomegaly. Status post TAVR. Left chest wall dual-lead pacemaker. 3. Moderate hiatal hernia. Electronically signed by: Katheleen Faes Jessica [name] 01/11/2024 02:09 PM EDT RP Workstation: [id number]   CT ABDOMEN PELVIS WO CONTRAST Result Date: 01/11/2024 EXAM: CT ABDOMEN AND PELVIS WITHOUT CONTRAST 01/11/2024 01:49:13 PM TECHNIQUE: CT of the abdomen and pelvis was performed without the administration of intravenous contrast. Multiplanar reformatted images are provided for review. Automated exposure control, iterative reconstruction, and/or weight-based adjustment of the mA/kV was utilized to reduce the radiation dose to as low as reasonably achievable. COMPARISON: 03/28/2022 CLINICAL HISTORY: LLQ abdominal pain; Sepsis. FINDINGS: LOWER CHEST: Post TAVR. Transvenous pacing leads partially visualized. Heavy mitral annulus calcifications. Moderate hiatal hernia. LIVER: The liver is unremarkable. GALLBLADDER AND BILE DUCTS: Post cholecystectomy. No biliary ductal dilatation. SPLEEN: No acute abnormality. PANCREAS: No acute abnormality. ADRENAL GLANDS: No acute abnormality. KIDNEYS, URETERS AND BLADDER: 3 mm peripheral calcification in the left lower pole renal collecting system.  No hydronephrosis. No perinephric or periureteral stranding. Urinary bladder is unremarkable. GI AND BOWEL: Stomach demonstrates no acute abnormality. Multiple diverticula involving descending and proximal sigmoid colon with some mild adjacent inflammatory/edematous changes in the mid descending segment, no abscess. There is no bowel obstruction. PERITONEUM AND RETROPERITONEUM: No ascites. No free air. VASCULATURE: Aorta is normal in caliber. Moderate aortoiliac calcified plaque without aneurysm. LYMPH NODES: No lymphadenopathy. REPRODUCTIVE ORGANS: Small partial calcified uterine fibroids. BONES AND SOFT TISSUES: Bilateral hip DJD. Vertebral endplate spurring at multiple levels in the lower thoracic spine.  Disc and facet degenerative change L3-S1. No focal soft tissue abnormality. IMPRESSION: 1. Acute uncomplicated diverticulitis of the mid descending colon. No abscess. 2. Moderate hiatal hernia. 3. Nonobstructive left urolithiasis. Electronically signed by: Dayne Hassell Jessica [name] 01/11/2024 02:08 PM EDT RP Workstation: [id number]    Microbiology: Results for orders placed or performed during the hospital encounter of 01/11/24  Blood Culture (routine x 2)     Status: None (Preliminary result)   Collection Time: 01/11/24 12:44 PM   Specimen: BLOOD RIGHT ARM  Result Value Ref Range Status   Specimen Description   Final    BLOOD RIGHT ARM Performed at [facility], [address] [place], [place] [address]    Special Requests   Final    BOTTLES DRAWN AEROBIC AND ANAEROBIC Blood Culture results may not be optimal due to an inadequate volume of blood received in culture bottles Performed at [facility], [address], [place], [place] [address]    Culture   Final    NO GROWTH 2 DAYS Performed at [facility], [address], [place], [place] [address]    Report Status PENDING  Incomplete  Blood Culture (routine x 2)     Status: None (Preliminary result)   Collection  Time: 01/11/24 12:59 PM   Specimen: BLOOD  Result Value Ref Range Status   Specimen Description   Final    BLOOD SITE NOT SPECIFIED Performed at [facility], [address] [place], [place] [address]    Special Requests   Final    BOTTLES DRAWN AEROBIC AND ANAEROBIC Blood Culture adequate volume Performed at [facility], [address] [place], [place] [address]    Culture   Final    NO GROWTH 2 DAYS Performed at [facility], [address] [place], [place] [address]    Report Status PENDING  Incomplete   *Note: Due to a large number of results and/or encounters for the requested time period, some results have not been displayed. A complete set of results can be found in Results Review.    Labs: CBC: Recent Labs  Lab 01/11/24 1244 01/11/24 1248 01/12/24 0345 01/13/24 0544  WBC 13.8*  --  11.8* 9.5  NEUTROABS 8.6*  --   --  3.9  HGB 14.0 15.3* 11.2* 11.6*  HCT 45.5 45.0 36.1 36.9  MCV 95.4  --  96.5 96.3  PLT 194  --  140* 147*   Basic Metabolic Panel: Recent Labs  Lab 01/11/24 1244 01/11/24 1248 01/12/24 0345 01/13/24 0544  NA 133* 134* 137 140  K 5.2* 5.1 4.3 4.5  CL 93* 98 103 106  CO2 25  --  23 22  GLUCOSE 176* 176* 106* 111*  BUN 62* 59* 46* 25*  CREATININE 2.61* 2.80* 1.82* 1.21*  CALCIUM  11.0*  --  9.3 9.6   Liver Function Tests: Recent Labs  Lab 01/11/24 1244  AST 47*  ALT 47*  ALKPHOS 80  BILITOT 0.5  PROT 8.7*  ALBUMIN  4.6   CBG: Recent Labs  Lab 01/12/24 0803 01/12/24 1152 01/12/24 1637 01/12/24 2314 01/13/24 0859  GLUCAP 119* 164* 156* 162* 144*    Discharge time spent: greater than 30 minutes.  Signed: Delon Herald, Jessica [name] [facility] 01/13/2024

## 2024-01-13 NOTE — Evaluation (Signed)
 Occupational Therapy Evaluation Patient Details Name: Jessica [name] MRN: [id number] DOB: May 30, 1933 Today's Date: 01/13/2024   History of Present Illness   89 yr old female who presented to the hospital with LLQ abdominal pain, nausea and vomiting.  She was found to have sepsis from diverticulitis with fluid-responsive hypotension. PMH: afib, pacemaker, chronic diastolic CHF, PE, HTN, HLD, hypothyroidism, obesity, s/p TAVR, and DM     Clinical Impressions The pt is currently presenting below her baseline level of functioning for self-care management. She is limited by the below listed deficits (see OT problem list). As such, her occupational performance is compromised and he requires assist for self-care management. During the session, she required min assist for supine to sit, set-up to min assist for upper body grooming, and min assist to stand from the EOB using a RW. She reported feelings of generalized weakness and she further appeared to be with slight deconditioning. She will benefit from further OT services to maximize her independence with self-care tasks and to decrease the risk for further weakness and deconditioning. Home health OT is recommended.     If plan is discharge home, recommend the following:   Assist for transportation;Help with stairs or ramp for entrance;Assistance with cooking/housework;A little help with bathing/dressing/bathroom     Functional Status Assessment   Patient has had a recent decline in their functional status and demonstrates the ability to make significant improvements in function in a reasonable and predictable amount of time.     Equipment Recommendations   None recommended by OT     Recommendations for Other Services         Precautions/Restrictions   Precautions Precautions: Fall Restrictions Weight Bearing Restrictions Per Provider Order: No     Mobility Bed Mobility Overal bed mobility: Needs Assistance Bed  Mobility: Supine to Sit     Supine to sit: Min assist          Transfers Overall transfer level: Needs assistance Equipment used: Rolling walker (2 wheels) Transfers: Sit to/from Stand Sit to Stand: Min assist           General transfer comment: She was further instructed on lateral stepping along the EOB using a RW, for which she required CGA and cues for walker advancement.      Balance     Sitting balance-Leahy Scale: Fair         Standing balance comment: CGA to min assist using a RW       ADL either performed or assessed with clinical judgement   ADL Overall ADL's : Needs assistance/impaired Eating/Feeding: Independent;Sitting   Grooming: Set up;Sitting;Minimal assistance Grooming Details (indicate cue type and reason): The pt required set-up assist for face washing seated EOB and min assist for hair combing.         Upper Body Dressing : Minimal assistance;Sitting Upper Body Dressing Details (indicate cue type and reason): She required some assist to fasten and donn her bra. She subsequently required SBA to donn an overhead shirt. Lower Body Dressing: Moderate assistance;Sitting/lateral leans;Sit to/from stand Lower Body Dressing Details (indicate cue type and reason): The pt required assist to donn her brief and pants over her ankles and up to her knees in sitting, then steadying assist in standing to donn them up over hips.                      Pertinent Vitals/Pain Pain Assessment Pain Assessment: No/denies pain     Extremity/Trunk Assessment Upper Extremity  Assessment Upper Extremity Assessment: RUE deficits/detail;LUE deficits/detail;Generalized weakness RUE Deficits / Details: AROM WFL. Grip strength grossly 4/5 LUE Deficits / Details: AROM WFL. Grip strength grossly 4/5   Lower Extremity Assessment Lower Extremity Assessment: Generalized weakness;RLE deficits/detail;LLE deficits/detail RLE Deficits / Details: AROM WFL LLE Deficits  / Details: AROM WFL       Communication Communication Communication: No apparent difficulties   Cognition Arousal: Alert Behavior During Therapy: WFL for tasks assessed/performed               OT - Cognition Comments: Oriented x4                 Following commands: Intact       Cueing  General Comments   Cueing Techniques: Verbal cues              Home Living Family/patient expects to be discharged to:: Private residence Living Arrangements: Other relatives (28 yr old granddaughter) Available Help at Discharge: Available PRN/intermittently Type of Home: House Home Access: Ramped entrance     Home Layout: Multi-level;1/2 bath on main level Alternate Level Stairs-Number of Steps: 3 story home, including basement, main level, and upper level. She resides on the main level of the home.             Home Equipment: Rollator (4 wheels);Wheelchair - Forensic psychologist (2 wheels);Lift chair          Prior Functioning/Environment Prior Level of Function : Independent/Modified Independent             Mobility Comments:  (Performs step-pivot transfers into and out of her manual wheelchair. Able to self-propel wheelchair. Only able to ambulate a couple feet typically. She sleeps in a lift chair on the main level of the home.  ADLs Comments:  (She is typically modified independent to independent with ADLs. She takes spongebaths and receives meals on wheels.)    OT Problem List: Decreased strength;Decreased activity tolerance;Impaired balance (sitting and/or standing);Decreased knowledge of use of DME or AE   OT Treatment/Interventions: Self-care/ADL training;Therapeutic exercise;Energy conservation;DME and/or AE instruction;Therapeutic activities;Balance training;Patient/family education      OT Goals(Current goals can be found in the care plan section)   ADL Goals Pt Will Perform Lower Body Dressing: with supervision;with set-up;sit to/from  stand;sitting/lateral leans;with adaptive equipment Pt Will Transfer to Toilet: with supervision;bedside commode;stand pivot transfer Pt Will Perform Toileting - Clothing Manipulation and hygiene: with supervision;sit to/from stand   OT Frequency:  Min 2X/week       AM-PAC OT 6 Clicks Daily Activity     Outcome Measure Help from another person eating meals?: None Help from another person taking care of personal grooming?: A Little Help from another person toileting, which includes using toliet, bedpan, or urinal?: A Little Help from another person bathing (including washing, rinsing, drying)?: A Lot Help from another person to put on and taking off regular upper body clothing?: A Little Help from another person to put on and taking off regular lower body clothing?: A Lot 6 Click Score: 17   End of Session Equipment Utilized During Treatment: Rolling walker (2 wheels) Nurse Communication: Mobility status  Activity Tolerance: Patient tolerated treatment well Patient left: in bed;with call bell/phone within reach  OT Visit Diagnosis: Unsteadiness on feet (R26.81);Other abnormalities of gait and mobility (R26.89);Muscle weakness (generalized) (M62.81)                Time: 8957-8856 OT Time Calculation (min): 61 min Charges:  OT General Charges $OT Visit:  1 Visit OT Evaluation $OT Eval Moderate Complexity: 1 Mod OT Treatments $Self Care/Home Management : 8-22 mins $Therapeutic Activity: 8-22 mins    Delanna JINNY Lesches, OTR/L 01/13/2024, 1:21 PM

## 2024-01-14 ENCOUNTER — Telehealth: Payer: Self-pay

## 2024-01-14 NOTE — Transitions of Care (Post Inpatient/ED Visit) (Signed)
   01/14/2024  Name: Jessica [name] MRN: [id number] DOB: [birth year]  Today's TOC FU Call Status: Today's TOC FU Call Status:: Unsuccessful Call (1st Attempt) Unsuccessful Call (1st Attempt) Date: 01/14/24  Attempted to reach the patient regarding the most recent Inpatient/ED visit.  Follow Up Plan: Additional outreach attempts will be made to reach the patient to complete the Transitions of Care (Post Inpatient/ED visit) call.   Shona Prow RN, CCM [place]  [facility]BC[facility]-Population Health RN Care Manager [phone]

## 2024-01-14 NOTE — Telephone Encounter (Signed)
 Copied from CRM [id number]. Topic: Clinical - Home Health Verbal Orders >> Jan 14, 2024  9:46 AM Charolett L wrote: Caller/Agency: [facility] Callback Number: [phone] VM) Service Requested: Physical Therapy Frequency: PT 1x a week for 9 weeks Any new concerns about the patient? No

## 2024-01-15 ENCOUNTER — Telehealth: Payer: Self-pay

## 2024-01-15 ENCOUNTER — Ambulatory Visit: Admitting: Internal Medicine

## 2024-01-15 NOTE — Transitions of Care (Post Inpatient/ED Visit) (Signed)
   01/15/2024  Name: Jessica [name] MRN: [id number] DOB: 1933-07-13  Today's TOC FU Call Status: Today's TOC FU Call Status:: Successful TOC FU Call Completed TOC FU Call Complete Date: 01/15/24 (patient states her granddaughter, Rocky lives with her and they've already reviewed medications and has a neighbor with her now, who helps her and states she gets MOW and has Home Health coming back today. Declined TOC call/program) Patient's Name and Date of Birth confirmed.  Transition Care Management Follow-up Telephone Call How have you been since you were released from the hospital?: Better  Items Reviewed: Did you receive and understand the discharge instructions provided?: Yes Medications obtained,verified, and reconciled?: No Medications Not Reviewed Reasons:: Other: (patient declined) Do you have support at home?: Yes People in Home [RPT]: grandchild(ren) Name of Support/Comfort Primary Source: Patient states her granddaughter, Rocky, her niece. [name], and neighbor all help  Medications Reviewed Today: Patient declined Medications Reviewed Today   Medications were not reviewed in this encounter     Home Care and Equipment/Supplies: Were Home Health Services Ordered?: Yes Name of Home Health Agency:: Patient did not confirm but record reflects [facility], OT Has Agency set up a time to come to your home?: Yes First Home Health Visit Date: 01/15/24 (Patient did not give date of first visit but stated they are coming back today after having done the start of care)  Shona Prow RN, CCM [place]  [facility]BC[facility]-Population Health RN Care Manager [phone]

## 2024-01-16 ENCOUNTER — Other Ambulatory Visit (HOSPITAL_BASED_OUTPATIENT_CLINIC_OR_DEPARTMENT_OTHER): Payer: Self-pay | Admitting: Family

## 2024-01-16 DIAGNOSIS — I5032 Chronic diastolic (congestive) heart failure: Secondary | ICD-10-CM

## 2024-01-16 LAB — CULTURE, BLOOD (ROUTINE X 2)
Culture: NO GROWTH
Culture: NO GROWTH
Special Requests: ADEQUATE

## 2024-01-17 ENCOUNTER — Telehealth: Payer: Self-pay

## 2024-01-17 NOTE — Patient Instructions (Signed)
 Jessica [name] - I am sorry I was unable to reach you today for our scheduled appointment. I work with Plotnikov, Karlynn GAILS, MD and am calling to support your healthcare needs. Please contact me at [phone] at your earliest convenience. I look forward to speaking with you soon.   Thank you,   [name], RN, MSN, BSN, CCM [place]  [facility], Population Health Case Manager Phone: [phone]

## 2024-01-18 NOTE — Telephone Encounter (Signed)
 Okay. Thank you.

## 2024-01-21 ENCOUNTER — Other Ambulatory Visit (HOSPITAL_BASED_OUTPATIENT_CLINIC_OR_DEPARTMENT_OTHER): Payer: Self-pay | Admitting: Cardiovascular Disease

## 2024-01-21 NOTE — Telephone Encounter (Signed)
 Was advised to hold until follow up at time of hospital discharge. Would remain off until visit 01/24/24 with PCP. Whether or not to resume can be addressed at that time.   Satori Krabill S Lashala Laser, NP

## 2024-01-21 NOTE — Telephone Encounter (Signed)
 Can you review this-Med has been paused

## 2024-01-22 ENCOUNTER — Other Ambulatory Visit: Payer: Self-pay | Admitting: Cardiovascular Disease

## 2024-01-22 DIAGNOSIS — I5032 Chronic diastolic (congestive) heart failure: Secondary | ICD-10-CM

## 2024-01-24 ENCOUNTER — Ambulatory Visit: Admitting: Internal Medicine

## 2024-01-24 ENCOUNTER — Encounter: Payer: Self-pay | Admitting: Internal Medicine

## 2024-01-24 ENCOUNTER — Other Ambulatory Visit: Payer: Self-pay | Admitting: Cardiovascular Disease

## 2024-01-24 ENCOUNTER — Other Ambulatory Visit: Payer: Self-pay | Admitting: Internal Medicine

## 2024-01-24 ENCOUNTER — Other Ambulatory Visit (HOSPITAL_COMMUNITY): Payer: Self-pay

## 2024-01-24 ENCOUNTER — Other Ambulatory Visit: Payer: Self-pay | Admitting: Cardiology

## 2024-01-24 ENCOUNTER — Telehealth: Payer: Self-pay

## 2024-01-24 VITALS — BP 124/78 | HR 74 | Temp 98.1°F | Ht 61.0 in | Wt 216.0 lb

## 2024-01-24 DIAGNOSIS — Z23 Encounter for immunization: Secondary | ICD-10-CM | POA: Diagnosis not present

## 2024-01-24 DIAGNOSIS — F03918 Unspecified dementia, unspecified severity, with other behavioral disturbance: Secondary | ICD-10-CM

## 2024-01-24 DIAGNOSIS — F419 Anxiety disorder, unspecified: Secondary | ICD-10-CM

## 2024-01-24 DIAGNOSIS — E118 Type 2 diabetes mellitus with unspecified complications: Secondary | ICD-10-CM

## 2024-01-24 DIAGNOSIS — I5043 Acute on chronic combined systolic (congestive) and diastolic (congestive) heart failure: Secondary | ICD-10-CM

## 2024-01-24 DIAGNOSIS — N184 Chronic kidney disease, stage 4 (severe): Secondary | ICD-10-CM

## 2024-01-24 DIAGNOSIS — Z7985 Long-term (current) use of injectable non-insulin antidiabetic drugs: Secondary | ICD-10-CM | POA: Diagnosis not present

## 2024-01-24 DIAGNOSIS — I5032 Chronic diastolic (congestive) heart failure: Secondary | ICD-10-CM

## 2024-01-24 DIAGNOSIS — K5792 Diverticulitis of intestine, part unspecified, without perforation or abscess without bleeding: Secondary | ICD-10-CM | POA: Diagnosis not present

## 2024-01-24 LAB — CBC WITH DIFFERENTIAL/PLATELET
Basophils Absolute: 0 K/uL (ref 0.0–0.1)
Basophils Relative: 0.4 % (ref 0.0–3.0)
Eosinophils Absolute: 0 K/uL (ref 0.0–0.7)
Eosinophils Relative: 0.5 % (ref 0.0–5.0)
HCT: 37.2 % (ref 36.0–46.0)
Hemoglobin: 12.2 g/dL (ref 12.0–15.0)
Lymphocytes Relative: 19.9 % (ref 12.0–46.0)
Lymphs Abs: 1.9 K/uL (ref 0.7–4.0)
MCHC: 32.9 g/dL (ref 30.0–36.0)
MCV: 91.5 fl (ref 78.0–100.0)
Monocytes Absolute: 1.4 K/uL — ABNORMAL HIGH (ref 0.1–1.0)
Monocytes Relative: 14.5 % — ABNORMAL HIGH (ref 3.0–12.0)
Neutro Abs: 6.3 K/uL (ref 1.4–7.7)
Neutrophils Relative %: 64.7 % (ref 43.0–77.0)
Platelets: 286 K/uL (ref 150.0–400.0)
RBC: 4.07 Mil/uL (ref 3.87–5.11)
RDW: 14.7 % (ref 11.5–15.5)
WBC: 9.7 K/uL (ref 4.0–10.5)

## 2024-01-24 LAB — COMPREHENSIVE METABOLIC PANEL WITH GFR
ALT: 29 U/L (ref 0–35)
AST: 31 U/L (ref 0–37)
Albumin: 4 g/dL (ref 3.5–5.2)
Alkaline Phosphatase: 66 U/L (ref 39–117)
BUN: 26 mg/dL — ABNORMAL HIGH (ref 6–23)
CO2: 26 meq/L (ref 19–32)
Calcium: 9.9 mg/dL (ref 8.4–10.5)
Chloride: 102 meq/L (ref 96–112)
Creatinine, Ser: 1.27 mg/dL — ABNORMAL HIGH (ref 0.40–1.20)
GFR: 37.8 mL/min — ABNORMAL LOW (ref >60.00)
Glucose, Bld: 119 mg/dL — ABNORMAL HIGH (ref 70–99)
Potassium: 4 meq/L (ref 3.5–5.1)
Sodium: 139 meq/L (ref 135–145)
Total Bilirubin: 0.4 mg/dL (ref 0.2–1.2)
Total Protein: 7.8 g/dL (ref 6.0–8.3)

## 2024-01-24 MED ORDER — EZETIMIBE 10 MG PO TABS
10.0000 mg | ORAL_TABLET | Freq: Every day | ORAL | 2 refills | Status: DC
  Filled 2024-01-24: qty 90, 90d supply, fill #0

## 2024-01-24 MED ORDER — RIVAROXABAN 15 MG PO TABS: 15.0000 mg | ORAL_TABLET | Freq: Every day | ORAL | 1 refills | Status: DC

## 2024-01-24 MED ORDER — TIRZEPATIDE 2.5 MG/0.5ML ~~LOC~~ SOAJ: 2.5000 mg | SUBCUTANEOUS | 3 refills | Status: DC

## 2024-01-24 NOTE — Assessment & Plan Note (Signed)
 Hemoglobin A1c is not very reliable due to decreased GFR.  Probably much better controlled with weight loss on Mounjaro 

## 2024-01-24 NOTE — Assessment & Plan Note (Signed)
 Chronic  ?Continue on lorazepam prn ? Potential benefits of a long term prn benzodiazepines  use as well as potential risks  and complications were explained to the patient and were aknowledged. ?

## 2024-01-24 NOTE — Telephone Encounter (Signed)
 Prescription refill request for Xarelto  received.  Indication:afib Last office visit:8/25 Weight:98  kg Age:89 Scr:1.21  10/25 CrCl:46.72  ml/min  Prescription refilled

## 2024-01-24 NOTE — Assessment & Plan Note (Signed)
 Monitor GFR Hydrate well

## 2024-01-24 NOTE — Assessment & Plan Note (Signed)
 Recovering Finished Augmenttin

## 2024-01-24 NOTE — Telephone Encounter (Signed)
 RX sent in

## 2024-01-24 NOTE — Telephone Encounter (Signed)
*  STAT* If patient is at the pharmacy, call can be transferred to refill team.   1. Which medications need to be refilled? (please list name of each medication and dose if known) ezetimibe  (ZETIA ) 10 MG tablet   Rivaroxaban  (XARELTO ) 15 MG TABS tablet   2. Which pharmacy/location (including street and city if local pharmacy) is medication to be sent to? [facility] - [address], [place] - [address]   3. Do they need a 30 day or 90 day supply? 90

## 2024-01-24 NOTE — Telephone Encounter (Signed)
 Spoke to patient niece [name] (on HIPAA) -- received call from [facility] pharmacy requesting refill on cefadroxil . Patient has an appointment on 10/30, asked if patient has enough medication to last until then? [name] will call me back tomorrow to let me know.    [name], CMA

## 2024-01-24 NOTE — Assessment & Plan Note (Signed)
 Stable

## 2024-01-24 NOTE — Assessment & Plan Note (Signed)
 Currently compensated.  Continue on Entresto 

## 2024-01-24 NOTE — Progress Notes (Signed)
 Subjective:  Patient ID: [name], female    DOB: 04/09/1933  Age: 89 y.o. MRN: [id number]  CC: Hospitalization Follow-up [facility] Follow Up 10/10 - 10/12 for septic shock due to unspecified organism)   HPI KERBY HOCKLEY presents for sepsis due to diverticulitis f/u [facility] was called in C/o LBP after a fall 3 mo ago She is here w/Sharon, her niece Per hx:  Admit date:     01/11/2024  Discharge date: 01/13/24  Discharge Physician: Delon Herald    PCP: Garald Karlynn GAILS, MD    Recommendations at discharge:    You are being discharged home with physical and occupational therapy Complete antibiotics (Augmentin  twice daily for 7 more days); resume Cefadroxil  after completion of Augmentin  Stop taking Celebrex  Resume Toprol  XL on 10/13 if blood pressure is >120/70 Hold Lasix , spironolactone , potassium, and Entresto  until follow up appointment Bowel regimen is important; take Colace twice daily and Miralax  once a day with probiotic Follow up this week with Dr. Garald   Discharge Diagnoses: Principal Problem:   Sepsis due to undetermined organism [facility]) Active Problems:   CKD (chronic kidney disease) stage 4, GFR 15-29 ml/min (HCC)   Essential hypertension   DM type 2, controlled, with complication (HCC)   Hypothyroidism   DNR (do not resuscitate)   OBESITY, MORBID   HLD (hyperlipidemia)   History of pulmonary embolism   Presence of permanent cardiac pacemaker   Pure hypercholesterolemia   Diverticulitis large intestine w/o perforation or abscess w/o bleeding   Acute kidney injury superimposed on chronic kidney disease       Hospital Course: 89 y.o. female with medical history significant of afib, pacemaker, chronic diastolic CHF, PE, HTN, HLD, hypothyroidism, obesity, s/p TAVR, and DM who presented on 10/10 with LLQ abdominal pain and n/v.  She was found to have sepsis from diverticulitis with fluid-responsive hypotension.  BP has  normalized although she was hypoxic overnight and is on [place] O2.  DNR, would want limited interventions.  On Zosyn.   Assessment and Plan:   Severe sepsis due to diverticulitis Sepsis indicates life-threatening organ dysfunction with mortality >10%, caused by dysregulation to host response.   SIRS criteria in this patient includes leukocytosis, tachycardia, hypoxia Patient has evidence of acute organ failure with elevated lactate >2; recurrent hypotension (SBP < 90 or MAP < 65 x 2 readings) that is not easily explained by another condition Suspected source is diverticulitis and her CT supports this as a diagnosis Advance diet from clears to soft Pain medication with oxycodone  Nausea medication with Zofran  Treat with Zosyn for intraabdominal infection Blood cultures NTD Admitted to progressive care due to hemodynamic instability -> transfer to med surg with improvement Needs a good bowel regimen, will add probiotic   Acute hypoxic respiratory failure O2 sats overnight to 80%on 10/10 Placed on [place] O2 Resolved, back on RA Possible OSA?   AKI on stage 4 CKD Baseline creatinine 1.86, GFR 26 in 11/2023 Presenting creatinine 2.61, GFR 17 This is likely associated with hypovolemia in the setting of severe sepsis Avoid nephrotoxic agents Stop Celebrex  Resume Jardiance , diuretics Renal function has returned to baseline as of 10/11   H/o endocarditis On cefadroxil  BID indefinitely   Afib Rate controlled with Toprol  XL but holding this for now; resume once BP is improved Continue Xarelto    Chronic diastolic CHF Appears hypovolemic Hold Lasix , Enrtesto, spironolactone , Jardiance    H/o PE On Xarelto  for afib   HTN Holding Toprol  XL in the  setting of hypotension on presentation   HLD Takes Crestor  up to 3 times a week as tolerated Resume Zetia , Crestor     Hypothyroidism Continue Synthroid    DM A1c 9.9, very poor control Resume Glucophage , Prandin , Mounjaro  Carb modified diet     Gout Continue allopurinol  Resume colchicine  prn given improvement in renal function   Mood d/o Continue venlafaxine , temazepam , lorazepam    Morbid/class 3 obesity Body mass index is 40.81 kg/m.  Weight loss should be encouraged on an ongoing basis Resume Mounjaro  Friday as planned Outpatient PCP/bariatric medicine f/u encouraged Significantly low or high BMI is associated with higher medical risk including morbidity and mortality    DNR I have discussed code status with the patient/family and they are in agreement that the patient would not desire resuscitation and would prefer to die a natural death should that situation arise. Patient will need a gold out of facility DNR form at the time of discharge  She is willing to treat the treatable but would not wish to escalate care         Consultants: None   Procedures: None   Antibiotics: Zosyn 10/10- Vancomycin  x 1      Outpatient Medications Prior to Visit  Medication Sig Dispense Refill   allopurinol  (ZYLOPRIM ) 100 MG tablet TAKE 1 TABLET BY MOUTH DAILY 100 tablet 2   b complex vitamins tablet Take 1 tablet by mouth daily.     Camphor-Menthol-Methyl Sal (SALONPAS DEEP RELIEVING EX) Apply 1 patch topically daily as needed (for pain - to affected areas and remove as directed).     cefadroxil  (DURICEF) 500 MG capsule Take 500 mg by mouth 2 (two) times daily.     Cholecalciferol  (VITAMIN D3) 1000 units CAPS Take 1,000 Units by mouth in the morning and at bedtime.     CINNAMON PO Take 1 capsule by mouth daily.     colchicine  0.6 MG tablet Take two tablets prn gout attack.Then take another one in 1-2 hrs. Do not repeat for 3 days. (Patient taking differently: Take 0.6-1.2 mg by mouth See admin instructions. Take 1.2 mg by mouth as needed for a gout attack. Take another 0.6 mg in 1-2 hours and do not repeat for 3 days.) 18 tablet 1   docusate sodium  (COLACE) 100 MG capsule Take 1 capsule (100 mg total) by mouth 2 (two) times  daily. 10 capsule 0   empagliflozin  (JARDIANCE ) 10 MG TABS tablet Take 1 tablet (10 mg total) by mouth daily. 90 tablet 3   ezetimibe  (ZETIA ) 10 MG tablet Take 1 tablet (10 mg total) by mouth daily. 90 tablet 2   furosemide  (LASIX ) 40 MG tablet TAKE 1 TABLET BY MOUTH TWICE  DAILY (Patient taking differently: Take 40-80 mg by mouth See admin instructions. Take 80 mg by mouth in the morning and 40 mg at bedtime) 200 tablet 2   levothyroxine  (SYNTHROID ) 75 MCG tablet TAKE 1 TABLET BY MOUTH DAILY (Patient taking differently: Take 75 mcg by mouth daily before breakfast.) 100 tablet 2   LORazepam  (ATIVAN ) 0.5 MG tablet TAKE 1 TABLET(0.5 MG) BY MOUTH TWICE DAILY AS NEEDED FOR ANXIETY (Patient taking differently: Take 1 mg by mouth at bedtime.) 180 tablet 1   metFORMIN  (GLUCOPHAGE ) 500 MG tablet TAKE 1 TABLET BY MOUTH TWICE  DAILY WITH MEALS (Patient taking differently: Take 500 mg by mouth in the morning and at bedtime.) 200 tablet 2   metoprolol  succinate (TOPROL -XL) 50 MG 24 hr tablet TAKE 1 TABLET BY MOUTH DAILY  WITH OR IMMEDIATELY FOLLOWING A  MEAL (Patient taking differently: Take 50 mg by mouth daily after breakfast.) 100 tablet 2   Multiple Vitamin (MULTIVITAMIN) tablet Take 1 tablet by mouth daily with breakfast.     ondansetron  (ZOFRAN ) 4 MG tablet Take 1 tablet (4 mg total) by mouth 2 (two) times daily as needed for nausea or vomiting. 20 tablet 0   polyethylene glycol (MIRALAX  / GLYCOLAX ) 17 g packet Take 17 g by mouth daily. 14 each 0   potassium chloride  SA (KLOR-CON  M) 20 MEQ tablet TAKE 1 TABLET BY MOUTH DAILY 100 tablet 2   repaglinide  (PRANDIN ) 2 MG tablet TAKE 2 TABLETS BY MOUTH 3 TIMES  DAILY BEFORE MEALS (Patient taking differently: Take 6 mg by mouth 2 (two) times daily before a meal.) 600 tablet 2   Rivaroxaban  (XARELTO ) 15 MG TABS tablet Take 1 tablet (15 mg total) by mouth daily with supper. 90 tablet 1   rosuvastatin  (CRESTOR ) 10 MG tablet TAKE 1 TABLET BY MOUTH UP TO 3  TIMES WEEKLY  AS TOLERATED (Patient taking differently: Take 10 mg by mouth every Monday, Wednesday, and Friday at 6 PM.) 43 tablet 2   saccharomyces boulardii (FLORASTOR) 250 MG capsule Take 1 capsule (250 mg total) by mouth 2 (two) times daily. 60 capsule 0   sacubitril -valsartan  (ENTRESTO ) 97-103 MG Take 1 tablet by mouth 2 (two) times daily. 180 tablet 3   spironolactone  (ALDACTONE ) 25 MG tablet TAKE ONE-HALF(12.5MG ) TABLET BY MOUTH DAILY (Patient taking differently: Take 12.5 mg by mouth in the morning.) 100 tablet 1   temazepam  (RESTORIL ) 15 MG capsule TAKE 1 CAPSULE BY MOUTH EVERY NIGHT AT BEDTIME AS NEEDED (Patient taking differently: Take 15 mg by mouth at bedtime.) 90 capsule 1   venlafaxine  XR (EFFEXOR -XR) 75 MG 24 hr capsule TAKE 1 CAPSULE BY MOUTH DAILY  WITH BREAKFAST 100 capsule 2   tirzepatide  (MOUNJARO ) 2.5 MG/0.5ML Pen Inject 2.5 mg into the skin once a week. (Patient taking differently: Inject 2.5 mg into the skin every Friday.) 2 mL 3   No facility-administered medications prior to visit.    ROS: Review of Systems  Constitutional:  Positive for fatigue. Negative for activity change, appetite change, chills and unexpected weight change.  HENT:  Negative for congestion, mouth sores and sinus pressure.   Eyes:  Negative for visual disturbance.  Respiratory:  Negative for cough and chest tightness.   Gastrointestinal:  Negative for abdominal pain and nausea.  Genitourinary:  Negative for difficulty urinating, frequency and vaginal pain.  Musculoskeletal:  Positive for gait problem. Negative for arthralgias and back pain.  Skin:  Negative for pallor and rash.  Neurological:  Positive for weakness. Negative for dizziness, tremors, numbness and headaches.  Hematological:  Bruises/bleeds easily.  Psychiatric/Behavioral:  Positive for decreased concentration. Negative for confusion and sleep disturbance.     Objective:  BP 124/78   Pulse 74   Temp 98.1 F (36.7 C)   Ht 5' 1 (1.549 m)    Wt 216 lb (98 kg)   SpO2 96%   BMI 40.81 kg/m   BP Readings from Last 3 Encounters:  01/24/24 124/78  01/13/24 (!) 108/53  12/06/23 109/66    Wt Readings from Last 3 Encounters:  01/24/24 216 lb (98 kg)  01/11/24 216 lb (98 kg)  12/06/23 244 lb (110.7 kg)    Physical Exam Constitutional:      General: She is not in acute distress.    Appearance: She is well-developed. She is  obese.  HENT:     Head: Normocephalic.     Right Ear: External ear normal.     Left Ear: External ear normal.     Nose: Nose normal.  Eyes:     General:        Right eye: No discharge.        Left eye: No discharge.     Conjunctiva/sclera: Conjunctivae normal.     Pupils: Pupils are equal, round, and reactive to light.  Neck:     Thyroid : No thyromegaly.     Vascular: No JVD.     Trachea: No tracheal deviation.  Cardiovascular:     Rate and Rhythm: Normal rate and regular rhythm.     Heart sounds: Normal heart sounds.  Pulmonary:     Effort: No respiratory distress.     Breath sounds: No stridor. No wheezing.  Abdominal:     General: Bowel sounds are normal. There is no distension.     Palpations: Abdomen is soft. There is no mass.     Tenderness: There is no abdominal tenderness. There is no guarding or rebound.  Musculoskeletal:        General: No tenderness.     Cervical back: Normal range of motion and neck supple. No rigidity.     Right lower leg: Edema present.     Left lower leg: Edema present.  Lymphadenopathy:     Cervical: No cervical adenopathy.  Skin:    Findings: No erythema or rash.  Neurological:     Mental Status: She is oriented to person, place, and time.     Cranial Nerves: No cranial nerve deficit.     Motor: No abnormal muscle tone.     Coordination: Coordination normal.     Gait: Gait abnormal.     Deep Tendon Reflexes: Reflexes normal.  Psychiatric:        Behavior: Behavior normal.        Thought Content: Thought content normal.        Judgment: Judgment  normal.     In a w/c  Lab Results  Component Value Date   WBC 9.7 01/24/2024   HGB 12.2 01/24/2024   HCT 37.2 01/24/2024   PLT 286.0 01/24/2024   GLUCOSE 119 (H) 01/24/2024   CHOL 207 (H) 07/12/2020   TRIG 277 (H) 07/12/2020   HDL 39 (L) 07/12/2020   LDLDIRECT 155.9 09/30/2012   LDLCALC 119 (H) 07/12/2020   ALT 29 01/24/2024   AST 31 01/24/2024   NA 139 01/24/2024   K 4.0 01/24/2024   CL 102 01/24/2024   CREATININE 1.27 (H) 01/24/2024   BUN 26 (H) 01/24/2024   CO2 26 01/24/2024   TSH 2.41 08/17/2023   INR 1.7 (H) 01/11/2024   HGBA1C 9.9 (H) 08/17/2023   MICROALBUR 4.5 (H) 12/02/2009    DG Chest Port 1 View Result Date: 01/11/2024 EXAM: 1 VIEW(S) XRAY OF THE CHEST 01/11/2024 01:56:00 PM COMPARISON: None available. CLINICAL HISTORY: hypotension. Per chart: Pt BIBA from home for abdominal pain and nausea x 1 day. Pt denies emesis. 8/10 pain in LLQ and epigastric area. Last bowel movement unknown but has been a while. FINDINGS: LUNGS AND PLEURA: No focal pulmonary opacity. No pulmonary edema. No pleural effusion. No pneumothorax. HEART AND MEDIASTINUM: Cardiomegaly. Post TAVR. Atherosclerotic plaque. Left chest wall dual lead pacemaker in place. Moderate hiatal hernia. BONES AND SOFT TISSUES: No acute osseous abnormality. IMPRESSION: 1. No acute cardiopulmonary process identified. 2. Cardiomegaly. Status post TAVR.  Left chest wall dual-lead pacemaker. 3. Moderate hiatal hernia. Electronically signed by: Katheleen Faes MD 01/11/2024 02:09 PM EDT RP Workstation: [id number]   CT ABDOMEN PELVIS WO CONTRAST Result Date: 01/11/2024 EXAM: CT ABDOMEN AND PELVIS WITHOUT CONTRAST 01/11/2024 01:49:13 PM TECHNIQUE: CT of the abdomen and pelvis was performed without the administration of intravenous contrast. Multiplanar reformatted images are provided for review. Automated exposure control, iterative reconstruction, and/or weight-based adjustment of the mA/kV was utilized to reduce the radiation  dose to as low as reasonably achievable. COMPARISON: 03/28/2022 CLINICAL HISTORY: LLQ abdominal pain; Sepsis. FINDINGS: LOWER CHEST: Post TAVR. Transvenous pacing leads partially visualized. Heavy mitral annulus calcifications. Moderate hiatal hernia. LIVER: The liver is unremarkable. GALLBLADDER AND BILE DUCTS: Post cholecystectomy. No biliary ductal dilatation. SPLEEN: No acute abnormality. PANCREAS: No acute abnormality. ADRENAL GLANDS: No acute abnormality. KIDNEYS, URETERS AND BLADDER: 3 mm peripheral calcification in the left lower pole renal collecting system. No hydronephrosis. No perinephric or periureteral stranding. Urinary bladder is unremarkable. GI AND BOWEL: Stomach demonstrates no acute abnormality. Multiple diverticula involving descending and proximal sigmoid colon with some mild adjacent inflammatory/edematous changes in the mid descending segment, no abscess. There is no bowel obstruction. PERITONEUM AND RETROPERITONEUM: No ascites. No free air. VASCULATURE: Aorta is normal in caliber. Moderate aortoiliac calcified plaque without aneurysm. LYMPH NODES: No lymphadenopathy. REPRODUCTIVE ORGANS: Small partial calcified uterine fibroids. BONES AND SOFT TISSUES: Bilateral hip DJD. Vertebral endplate spurring at multiple levels in the lower thoracic spine. Disc and facet degenerative change L3-S1. No focal soft tissue abnormality. IMPRESSION: 1. Acute uncomplicated diverticulitis of the mid descending colon. No abscess. 2. Moderate hiatal hernia. 3. Nonobstructive left urolithiasis. Electronically signed by: Katheleen Faes MD 01/11/2024 02:08 PM EDT RP Workstation: [id number]    Assessment & Plan:   Problem List Items Addressed This Visit     Anxiety disorder   Chronic  Continue on lorazepam  prn  Potential benefits of a long term prn benzodiazepines  use as well as potential risks  and complications were explained to the patient and were aknowledged.      CHF (congestive heart failure),  NYHA class II, acute on chronic, combined (HCC)   Currently compensated.  Continue on Entresto       Relevant Orders   CBC with Differential/Platelet (Completed)   Comprehensive metabolic panel with GFR (Completed)   CKD (chronic kidney disease) stage 4, GFR 15-29 ml/min (HCC)   Monitor GFR Hydrate well      Dementia with behavioral disturbance (HCC) - Primary   Stable      Diverticulitis   Recovering Finished Augmenttin      Relevant Orders   CBC with Differential/Platelet (Completed)   Comprehensive metabolic panel with GFR (Completed)   DM type 2, controlled, with complication (HCC)   Hemoglobin A1c is not very reliable due to decreased GFR.  Probably much better controlled with weight loss on Mounjaro       Relevant Medications   tirzepatide  (MOUNJARO ) 2.5 MG/0.5ML Pen      Meds ordered this encounter  Medications   tirzepatide  (MOUNJARO ) 2.5 MG/0.5ML Pen    Sig: Inject 2.5 mg into the skin once a week.    Dispense:  2 mL    Refill:  3      Follow-up: Return in about 4 months (around 05/26/2024) for a follow-up visit.  Marolyn Noel, MD

## 2024-01-25 ENCOUNTER — Other Ambulatory Visit (HOSPITAL_BASED_OUTPATIENT_CLINIC_OR_DEPARTMENT_OTHER): Payer: Self-pay | Admitting: Family

## 2024-01-25 ENCOUNTER — Telehealth: Payer: Self-pay

## 2024-01-25 DIAGNOSIS — I5032 Chronic diastolic (congestive) heart failure: Secondary | ICD-10-CM

## 2024-01-25 NOTE — Patient Instructions (Signed)
 Jessica [name] - I am sorry I was unable to reach you today for our scheduled appointment. I work with Plotnikov, Karlynn GAILS, MD and am calling to support your healthcare needs. Please contact me at [phone] at your earliest convenience. I look forward to speaking with you soon.   Thank you,   [name], RN, MSN, BSN, CCM [place]  [facility], Population Health Case Manager Phone: [phone]

## 2024-01-25 NOTE — Patient Outreach (Signed)
 Complex Care Management   Visit Note  01/25/2024  Name:  JANIA STEINKE MRN: [id number] DOB: 20-Aug-1933  Situation: RNCM made several unsuccessful outreach attempts x3. LCSW remains involved with a scheduled call on 02/18/24. RNCM will update LCSW.  Follow Up Plan:   RNCM will not  make any additional outreach.  [name], RN, MSN, BSN, CCM [place]  [facility], Population Health Case Manager Phone: [phone]

## 2024-01-26 ENCOUNTER — Other Ambulatory Visit: Payer: Self-pay | Admitting: Internal Medicine

## 2024-01-27 ENCOUNTER — Ambulatory Visit: Payer: Self-pay | Admitting: Internal Medicine

## 2024-01-28 ENCOUNTER — Telehealth (HOSPITAL_BASED_OUTPATIENT_CLINIC_OR_DEPARTMENT_OTHER): Payer: Self-pay | Admitting: Cardiovascular Disease

## 2024-01-28 NOTE — Telephone Encounter (Signed)
*  STAT* If patient is at the pharmacy, call can be transferred to refill team.   1. Which medications need to be refilled? (please list name of each medication and dose if known)   furosemide  (LASIX ) 40 MG tablet   2. Would you like to learn more about the convenience, safety, & potential cost savings by using the [facility]?   3. Are you open to using the [facility] (Type [facility]. ).  4. Which pharmacy/location (including street and city if local pharmacy) is medication to be sent to?  [facility] - [place], [place] - [address]   5. Do they need a 30 day or 90 day supply?   Caller Gennie) stated patient received a call regarding refilling this medication and wanting to confirm her Pharmacy.  Caller stated patient gets her prescription from [facility] [place], [place] - [address] but does not need any medication right now.

## 2024-01-29 ENCOUNTER — Other Ambulatory Visit: Payer: Self-pay | Admitting: Internal Medicine

## 2024-01-29 ENCOUNTER — Other Ambulatory Visit (HOSPITAL_BASED_OUTPATIENT_CLINIC_OR_DEPARTMENT_OTHER): Payer: Self-pay | Admitting: *Deleted

## 2024-01-29 ENCOUNTER — Other Ambulatory Visit: Payer: Self-pay | Admitting: Cardiovascular Disease

## 2024-01-29 DIAGNOSIS — I5032 Chronic diastolic (congestive) heart failure: Secondary | ICD-10-CM

## 2024-01-29 DIAGNOSIS — I4891 Unspecified atrial fibrillation: Secondary | ICD-10-CM

## 2024-01-29 MED ORDER — SPIRONOLACTONE 25 MG PO TABS: 12.5000 mg | ORAL_TABLET | Freq: Every day | ORAL | 3 refills | Status: DC

## 2024-01-29 NOTE — Telephone Encounter (Signed)
 Copied from CRM [id number]. Topic: Clinical - Medication Refill >> Jan 29, 2024  5:29 PM Tysheama G wrote: Medication: CINNAMON PO  LORazepam  (ATIVAN ) 0.5 MG tablet  Has the patient contacted their pharmacy? Yes (Agent: If no, request that the patient contact the pharmacy for the refill. If patient does not wish to contact the pharmacy document the reason why and proceed with request.) (Agent: If yes, when and what did the pharmacy advise?)  This is the patient's preferred pharmacy:   [facility], [place] - [address] [place] [address] Phone: [phone] Fax: [phone]  Is this the correct pharmacy for this prescription? Yes If no, delete pharmacy and type the correct one.   Has the prescription been filled recently? No  Is the patient out of the medication? Yes  Has the patient been seen for an appointment in the last year OR does the patient have an upcoming appointment? Yes  Can we respond through MyChart? No  Agent: Please be advised that Rx refills may take up to 3 business days. We ask that you follow-up with your pharmacy.

## 2024-01-29 NOTE — Telephone Encounter (Signed)
 Called to verify cinnamon tablet vs capsule.  Request updated to reflect patient taking capsule

## 2024-01-30 MED ORDER — RIVAROXABAN 15 MG PO TABS: 15.0000 mg | ORAL_TABLET | Freq: Every day | ORAL | 3 refills | Status: DC

## 2024-01-30 MED ORDER — SACUBITRIL-VALSARTAN 97-103 MG PO TABS: 1.0000 | ORAL_TABLET | Freq: Two times a day (BID) | ORAL | 2 refills | Status: DC

## 2024-01-30 MED ORDER — SPIRONOLACTONE 25 MG PO TABS: 12.5000 mg | ORAL_TABLET | Freq: Every day | ORAL | 2 refills | Status: DC

## 2024-01-30 MED ORDER — EZETIMIBE 10 MG PO TABS: 10.0000 mg | ORAL_TABLET | Freq: Every day | ORAL | 2 refills | Status: DC

## 2024-01-30 NOTE — Telephone Encounter (Signed)
 Xarelto  15mg  refill request received. Pt is 89 years old, weight-98kg, Crea-1.27 on 01/24/24, last seen by Reche Finder on 11/16/23, Diagnosis-Afib, CrCl-47.37 mL/min; Dose is appropriate based on dosing criteria. Will send in refill to requested pharmacy.

## 2024-01-31 ENCOUNTER — Other Ambulatory Visit: Payer: Self-pay

## 2024-01-31 ENCOUNTER — Encounter: Payer: Self-pay | Admitting: Internal Medicine

## 2024-01-31 ENCOUNTER — Ambulatory Visit: Admitting: Internal Medicine

## 2024-01-31 ENCOUNTER — Other Ambulatory Visit: Payer: Self-pay | Admitting: Internal Medicine

## 2024-01-31 VITALS — BP 103/65 | HR 58 | Temp 98.9°F | Resp 16

## 2024-01-31 DIAGNOSIS — B9561 Methicillin susceptible Staphylococcus aureus infection as the cause of diseases classified elsewhere: Secondary | ICD-10-CM | POA: Diagnosis not present

## 2024-01-31 DIAGNOSIS — T827XXD Infection and inflammatory reaction due to other cardiac and vascular devices, implants and grafts, subsequent encounter: Secondary | ICD-10-CM | POA: Diagnosis not present

## 2024-01-31 NOTE — Progress Notes (Signed)
 Patient ID: Jessica [name], female   DOB: [birth year], 89 y.o.   MRN: [id number]  HPI Jessica [name] is a 89 y.o. female here for hospital f/u tavr/pacer infection and mssa bacteremia   Jessica [name] is a 89 y.o. female here for hospital f/u tavr/pacer infection and mssa bacteremia  Patient is supposed to be on 6 weeks cefazolin  until 2/8 and transitioned to cefadroxil  indefinitely She was initially seen 03/29/22 by id Ula no vegetation on pacer but the tavr has vegetation Ct surgery evaluated and not surgical candidate She was put on rifampin  together with cefazolin  but couldn't tolerate rifampin  due to severe nausea  She was sick with covid and had admission 04/20/22 when rifampin  was stopped  05/09/22 id clinic She is here with her niece She is here for initial id clinic f/u She is still on 2 liters of oxygen. Patient is in a skilled nursing home She remains weak and she is not able to work as much as she wants with pt/ot. She is walking a few feet with FWW and help No issue with rue picc site No joint pain/back pain No diarrhea or rash Nausea and appetite better since rifampin  stopped. There are days that she don't even have nausea. 2-3/10 at worse about 3 times a week  06/20/22 id clinic f/u Doing very well on cefadroxil  1000 mg (2x 500 mg capsules) bid No n/v/diarrhea/rash No f/c No focal pain Accompanied by her caregiver today She left ama from snf about 3 weeks ago and is home    01/12/23 id clinic f/u Here with her neighbor Weak overall but improving slowly Come in on wheel chair from parking lot No n/v/diarrhea   07/17/23 id clinic f/u Gets in here via wheel chair Home pt/ot but still very weak in the legs bed room to the kitching [place] last week on her left shoulder and needed the fire department Lives alone -- one surviving kid had left AKA  No concern today -- as good as she can be Appetite is good No fever, chill Takes cefadrolil twice a day; no  n/v/diarrhea         Outpatient Encounter Medications as of 01/31/2024  Medication Sig   allopurinol  (ZYLOPRIM ) 100 MG tablet Take 1 tablet (100 mg total) by mouth daily.   b complex vitamins tablet Take 1 tablet by mouth daily.   Camphor-Menthol-Methyl Sal (SALONPAS DEEP RELIEVING EX) Apply 1 patch topically daily as needed (for pain - to affected areas and remove as directed).   cefadroxil  (DURICEF) 500 MG capsule TAKE 1 CAPSULE(500 MG) BY MOUTH TWICE DAILY   Cholecalciferol  (VITAMIN D3) 1000 units CAPS Take 1,000 Units by mouth in the morning and at bedtime.   CINNAMON PO Take 1 capsule by mouth daily.   colchicine  0.6 MG tablet Take two tablets prn gout attack.Then take another one in 1-2 hrs. Do not repeat for 3 days. (Patient taking differently: Take 0.6-1.2 mg by mouth See admin instructions. Take 1.2 mg by mouth as needed for a gout attack. Take another 0.6 mg in 1-2 hours and do not repeat for 3 days.)   docusate sodium  (COLACE) 100 MG capsule Take 1 capsule (100 mg total) by mouth 2 (two) times daily.   empagliflozin  (JARDIANCE ) 10 MG TABS tablet Take 1 tablet (10 mg total) by mouth daily.   ezetimibe  (ZETIA ) 10 MG tablet Take 1 tablet (10 mg total) by mouth daily.   furosemide  (LASIX ) 40 MG tablet TAKE 1 TABLET(S) BY  MOUTH 1 TIMES PER DAY *NEW PRESCRIPTION REQUEST*   levothyroxine  (SYNTHROID ) 75 MCG tablet Take 1 tablet (75 mcg total) by mouth daily before breakfast.   LORazepam  (ATIVAN ) 0.5 MG tablet TAKE 1 TABLET(0.5 MG) BY MOUTH TWICE DAILY AS NEEDED FOR ANXIETY (Patient taking differently: Take 1 mg by mouth at bedtime.)   metFORMIN  (GLUCOPHAGE ) 500 MG tablet Take 1 tablet (500 mg total) by mouth 2 (two) times daily with a meal.   metoprolol  succinate (TOPROL -XL) 50 MG 24 hr tablet Take 1 tablet (50 mg total) by mouth daily.   Multiple Vitamin (MULTIVITAMIN) tablet Take 1 tablet by mouth daily with breakfast.   ondansetron  (ZOFRAN ) 4 MG tablet Take 1 tablet (4 mg total) by  mouth every 8 (eight) hours as needed for nausea or vomiting.   polyethylene glycol (MIRALAX  / GLYCOLAX ) 17 g packet Take 17 g by mouth daily.   [Paused] potassium chloride  SA (KLOR-CON  M) 20 MEQ tablet TAKE 1 TABLET BY MOUTH DAILY   repaglinide  (PRANDIN ) 2 MG tablet TAKE 2 TABLETS BY MOUTH 3 TIMES  DAILY BEFORE MEALS (Patient taking differently: Take 6 mg by mouth 2 (two) times daily before a meal.)   Rivaroxaban  (XARELTO ) 15 MG TABS tablet Take 1 tablet (15 mg total) by mouth daily with supper.   rosuvastatin  (CRESTOR ) 10 MG tablet TAKE 1 TABLET BY MOUTH UP TO 3  TIMES WEEKLY AS TOLERATED (Patient taking differently: Take 10 mg by mouth every Monday, Wednesday, and Friday at 6 PM.)   saccharomyces boulardii (FLORASTOR) 250 MG capsule Take 1 capsule (250 mg total) by mouth 2 (two) times daily.   sacubitril -valsartan  (ENTRESTO ) 97-103 MG Take 1 tablet by mouth 2 (two) times daily.   spironolactone  (ALDACTONE ) 25 MG tablet Take 0.5 tablets (12.5 mg total) by mouth daily.   temazepam  (RESTORIL ) 15 MG capsule TAKE 1 CAPSULE BY MOUTH EVERY NIGHT AT BEDTIME AS NEEDED (Patient taking differently: Take 15 mg by mouth at bedtime.)   tirzepatide  (MOUNJARO ) 2.5 MG/0.5ML Pen Inject 2.5 mg into the skin once a week.   venlafaxine  XR (EFFEXOR -XR) 75 MG 24 hr capsule Take 1 capsule (75 mg total) by mouth daily with breakfast.   No facility-administered encounter medications on file as of 01/31/2024.     Patient Active Problem List   Diagnosis Date Noted   Diverticulitis 01/24/2024   Sepsis due to undetermined organism (HCC) 01/11/2024   Diverticulitis large intestine w/o perforation or abscess w/o bleeding 01/11/2024   Acute kidney injury superimposed on chronic kidney disease 01/11/2024   Gout attack 12/06/2023   UTI (urinary tract infection) 11/20/2022   Hair loss 08/14/2022   Hypokalemia 05/15/2022   Acute on chronic diastolic CHF (congestive heart failure) (HCC) 05/14/2022   ABLA (acute blood loss  anemia) 04/20/2022   Elevated INR 04/20/2022   Prolonged QT interval 04/20/2022   Delirium 04/19/2022   History of bacteremia 04/18/2022   COVID-19 virus infection 04/18/2022   COPD with acute exacerbation (HCC) 04/18/2022   Dementia with behavioral disturbance (HCC) 04/18/2022   MSSA bacteremia 04/04/2022   Prosthetic valve endocarditis 04/04/2022   Pacemaker infection 04/04/2022   Sepsis (HCC) 03/28/2022   Altered mental status 03/28/2022   Hypovolemia 03/28/2022   CKD (chronic kidney disease) stage 4, GFR 15-29 ml/min (HCC) 03/28/2022   Severe sepsis with acute organ dysfunction (HCC) 03/28/2022   DNR (do not resuscitate) 03/28/2022   Hypotension 03/28/2022   Rectal bleeding    Pure hypercholesterolemia 01/28/2021   Fall 12/22/2020   Knee  contusion 12/22/2020   Eczema 09/09/2019   Vertigo 03/05/2019   S/P TAVR (transcatheter aortic valve replacement) 01/14/2019   Presence of permanent cardiac pacemaker    Pulmonary nodule    Severe aortic stenosis    Atrial fibrillation and flutter (HCC) 07/05/2017   CHF (congestive heart failure), NYHA class II, acute on chronic, combined (HCC) 04/19/2017   DOE (dyspnea on exertion) 04/18/2017   Chronic venous insufficiency 12/08/2016   Erythema nodosum 11/10/2016   Paronychia of finger, left 04/13/2016   Left carotid artery stenosis 09/24/2014   Diarrhea 07/12/2013   Foot pain, left 03/18/2013   Gastrocnemius strain 03/18/2013   GERD (gastroesophageal reflux disease) 08/18/2011   History of pulmonary embolism 10/2008   DUODENITIS, WITHOUT HEMORRHAGE 06/29/2008   Diverticulosis of colon without hemorrhage 06/29/2008   DM type 2, controlled, with complication (HCC) 01/20/2008   Low back pain 10/15/2007   OBESITY, MORBID 07/16/2007   Grieving 05/15/2007   HLD (hyperlipidemia) 03/03/2007   Hypothyroidism 11/06/2006   Anxiety disorder 11/06/2006   CARPAL TUNNEL SYNDROME 11/06/2006   Essential hypertension 11/06/2006   HIATAL HERNIA  11/06/2006   Osteoarthritis 11/06/2006   History of endocarditis 11/06/2006     Health Maintenance Due  Topic Date Due   OPHTHALMOLOGY EXAM  Never done   FOOT EXAM  12/22/2018   COVID-19 Vaccine (3 - Pfizer risk series) 07/29/2019     Review of Systems All other ros negative  Physical Exam   BP 103/65   Pulse (!) 58   Temp 98.9 F (37.2 C) (Temporal)   Resp 16   SpO2 95%   Physical Exam  General/constitutional: no distress, pleasant HEENT: Normocephalic, PER, Conj Clear, EOMI, Oropharynx clear Neck supple CV: rrr no mrg - left chest pacer pocket site no tenderness Lungs: clear to auscultation, normal respiratory effort Abd: Soft, Nontender Ext: no edema Skin: No Rash Neuro: nonfocal MSK: no peripheral joint swelling/tenderness/warmth; back spines nontender     CBC Lab Results  Component Value Date   WBC 9.7 01/24/2024   RBC 4.07 01/24/2024   HGB 12.2 01/24/2024   HCT 37.2 01/24/2024   PLT 286.0 01/24/2024   MCV 91.5 01/24/2024   MCH 30.3 01/13/2024   MCHC 32.9 01/24/2024   RDW 14.7 01/24/2024   LYMPHSABS 1.9 01/24/2024   MONOABS 1.4 (H) 01/24/2024   EOSABS 0.0 01/24/2024    BMET Lab Results  Component Value Date   NA 139 01/24/2024   K 4.0 01/24/2024   CL 102 01/24/2024   CO2 26 01/24/2024   GLUCOSE 119 (H) 01/24/2024   BUN 26 (H) 01/24/2024   CREATININE 1.27 (H) 01/24/2024   CALCIUM  9.9 01/24/2024   GFRNONAA 43 (L) 01/13/2024   GFRAA 63 12/16/2019   No results found for: CRP   Assessment and Plan  Abx: 05/09/22-c cefadroxil   03/29/22-2/6 Cefazolin  04/06/22-04/20/22 rifampin                                                    Assessment: Mssa bsi, tavr/pacer infection    Patient recent admission for mssa process, d/c'ed to snf on opat for 6 weeks rifampin /cefaz and continued on indefinite cefadroxil , admitted 1/16 for ams/weakness, diarrhea, worsening nausea found to have covid infection   Recent admission w/u --> MSSA prosthetic  valve endocarditis involving TAVR and presumed PPM involvement (though no observed vegetation  on TEE).    In reviewing her nausea it seems she has had that since last admission and given aki on presentation, concern this is rather more severe than average. I suspect the rifampin  is inducing this. This was started in setting her cardiac device unable to be extracted. Given unclear benefit and limiting side effect will hold rifampin  for now. Will finish cefazolin  on 05/11/22 and continue suppressive abx with cefadroxil  1 gram bid indefinitely     Now that she is off rifampin , her previous DOAC can be resumed    Covid management per primary team   --------- 05/09/22 Doing well Nausea basically resolved and appetite better since rifampin  stopped 04/20/22 Recovering from covid infection still weak and on 2 liters o2 No concerning changes in terms of tavr/pacer infection   Finish cefazolin  on 2/8 Start cefadroxil  1 gram twice a day on 05/12/22 F/u 6 weeks   3/19 id assessment Doing well Will get labs to monitor toxicity long term abx In 3 months if things go well will decrease to 500 mg po bid Refill cefadroxil  rx   ------- 07/17/23 id clinic assessment No concern today Tolerating suppressive cefadroxil  Reviewed recent labs no concern for cefadroxil  toxicity  Doing well from pacer infection standpoint.  Weak and needs pt/ot  Continue cefadroxil  500 mg twice a day  F/u with me in 6 months    --------- 01/31/24 id clinic assessment 10/10-10/12 admission for diverticulitis/sepsis  Resolved now Back on cefadroxil  after sepsis treatment  Feels fine at baseline  No diarrhea  No n/v  Appetite is good again now  Continue cefadroxil  No labs needed today as recently done  Follow up 6 months  She reports at times her heart rate seems to be in 40s; today it is 60. I advised patient to follow up with cardiology for this.

## 2024-01-31 NOTE — Telephone Encounter (Signed)
 Copied from CRM [id number]. Topic: Clinical - Medication Refill >> Jan 31, 2024 12:19 PM China J wrote: Medication:  temazepam  (RESTORIL ) 15 MG capsule Vitamin D3 1000 Units  Has the patient contacted their pharmacy? Yes (Agent: If no, request that the patient contact the pharmacy for the refill. If patient does not wish to contact the pharmacy document the reason why and proceed with request.) (Agent: If yes, when and what did the pharmacy advise?) Pharmacy calling for refill request.   This is the patient's preferred pharmacy:  [facility], [place] - [address] [place] [address] Phone: [phone] Fax: [phone]  Is this the correct pharmacy for this prescription? Yes If no, delete pharmacy and type the correct one.   Has the prescription been filled recently? No  Is the patient out of the medication? No  Has the patient been seen for an appointment in the last year OR does the patient have an upcoming appointment? Yes  Can we respond through MyChart? Yes  Agent: Please be advised that Rx refills may take up to 3 business days. We ask that you follow-up with your pharmacy.

## 2024-01-31 NOTE — Patient Instructions (Signed)
 Check with your cardiologist and make sure the pacemaker is functioning and battery is ok   Continue cefadroxil  500 mg twice a day   No need to do any labs today

## 2024-02-02 DIAGNOSIS — I130 Hypertensive heart and chronic kidney disease with heart failure and stage 1 through stage 4 chronic kidney disease, or unspecified chronic kidney disease: Secondary | ICD-10-CM | POA: Diagnosis not present

## 2024-02-02 DIAGNOSIS — J449 Chronic obstructive pulmonary disease, unspecified: Secondary | ICD-10-CM

## 2024-02-02 DIAGNOSIS — Z7985 Long-term (current) use of injectable non-insulin antidiabetic drugs: Secondary | ICD-10-CM

## 2024-02-02 DIAGNOSIS — I4892 Unspecified atrial flutter: Secondary | ICD-10-CM

## 2024-02-02 DIAGNOSIS — I5043 Acute on chronic combined systolic (congestive) and diastolic (congestive) heart failure: Secondary | ICD-10-CM | POA: Diagnosis not present

## 2024-02-02 DIAGNOSIS — I4891 Unspecified atrial fibrillation: Secondary | ICD-10-CM

## 2024-02-02 DIAGNOSIS — I38 Endocarditis, valve unspecified: Secondary | ICD-10-CM

## 2024-02-02 DIAGNOSIS — E1142 Type 2 diabetes mellitus with diabetic polyneuropathy: Secondary | ICD-10-CM

## 2024-02-02 DIAGNOSIS — K5732 Diverticulitis of large intestine without perforation or abscess without bleeding: Secondary | ICD-10-CM | POA: Diagnosis not present

## 2024-02-02 DIAGNOSIS — I959 Hypotension, unspecified: Secondary | ICD-10-CM

## 2024-02-02 DIAGNOSIS — E1122 Type 2 diabetes mellitus with diabetic chronic kidney disease: Secondary | ICD-10-CM | POA: Diagnosis not present

## 2024-02-02 DIAGNOSIS — N184 Chronic kidney disease, stage 4 (severe): Secondary | ICD-10-CM

## 2024-02-05 ENCOUNTER — Ambulatory Visit: Admitting: Orthopedic Surgery

## 2024-02-05 MED ORDER — CINNAMON 500 MG PO CAPS: 500.0000 mg | ORAL_CAPSULE | Freq: Every day | ORAL | 3 refills | Status: AC

## 2024-02-05 MED ORDER — VITAMIN D3 1000 UNITS PO CAPS: 2000.0000 [IU] | ORAL_CAPSULE | Freq: Every day | ORAL | 1 refills | Status: AC

## 2024-02-05 MED ORDER — TEMAZEPAM 15 MG PO CAPS: ORAL_CAPSULE | ORAL | 1 refills | Status: DC

## 2024-02-08 ENCOUNTER — Telehealth: Payer: Self-pay | Admitting: Cardiovascular Disease

## 2024-02-08 MED ORDER — EZETIMIBE 10 MG PO TABS: 10.0000 mg | ORAL_TABLET | Freq: Every day | ORAL | 2 refills | Status: DC

## 2024-02-08 NOTE — Telephone Encounter (Signed)
 Jillian with [facility] calling for clarification on patients Rxs.  Pt c/o medication issue:  1. Name of Medication: ezetimibe  (ZETIA ) 10 MG tablet   rosuvastatin  (CRESTOR ) 10 MG tablet   2. How are you currently taking this medication (dosage and times per day)? N/a  3. Are you having a reaction (difficulty breathing--STAT)? No   4. What is your medication issue? Pharmacy calling on patients behalf to ask which medication she is supposed to be taking. Please advise.   [phone]

## 2024-02-08 NOTE — Telephone Encounter (Signed)
 Spoke with [facility] and advised should be taking both  Zetia  updated

## 2024-02-15 ENCOUNTER — Telehealth: Payer: Self-pay | Admitting: Pharmacy Technician

## 2024-02-15 MED ORDER — SACUBITRIL-VALSARTAN 97-103 MG PO TABS: 1.0000 | ORAL_TABLET | Freq: Two times a day (BID) | ORAL | 3 refills | Status: DC

## 2024-02-15 NOTE — Telephone Encounter (Signed)
 Refill sent to [facility].

## 2024-02-15 NOTE — Addendum Note (Signed)
 Addended by: MEMORY DELON POUR on: 02/15/2024 01:59 PM   Modules accepted: Orders

## 2024-02-15 NOTE — Telephone Encounter (Signed)
 Received notification from [employer] that Entresto  needed a refill:  attached in media for reference:

## 2024-02-18 ENCOUNTER — Other Ambulatory Visit: Payer: Self-pay | Admitting: Licensed Clinical Social Worker

## 2024-02-18 ENCOUNTER — Ambulatory Visit: Admitting: Internal Medicine

## 2024-02-18 NOTE — Patient Instructions (Signed)
 Visit Information  Thank you for taking time to visit with me today. Please don't hesitate to contact me if I can be of assistance to you before our next scheduled appointment.  Our next appointment is by telephone on 03/25/24  at 1:00 PM    Please call the care guide team at [phone] if you need to cancel or reschedule your appointment.   Following is a copy of your care plan:   Goals Addressed             This Visit's Progress    VBCI Social Work Care Plan       Problems:   Unsteady Gait: risk of falls. Uses wheelchair for ambulation             Needs help with daily ADLs             Has in home aide support as scheduled weekly             Has some challenges possibly in medication administration  Memory issues; sleep challenges  CSW Clinical Goal(s):   Over the next 30 days the Patient will attend all scheduled medical appointments as evidenced by patient report and care team review of appointment completion in electronic medical record.               Over next 30 days, patient will cooperate with in home care providers such as [facility] to  complete daily ADLs as needed  or HHPT rep to complete PT sessions AEB patient report of completed ADLs  and PT sessions in next 30 days     Interventions:  LCSW spoke with client regarding client needs and status.             Discussed pain issues of client, appetite of client and transport needs of client. [name] Party transports client to and from client medical appointments.              Reviewed upcoming client medical appointments with client             Discussed medication adherence of client. Client does take numerous medications.              Discussed program support for client with RN support, LCSW support and Pharmacist support            Discussed fall risk of client. Client said she has not had any recent falls. She has difficulty walking. She uses wheelchair to help with ambulation             Discussed client support  with PCP, Dr. Garald           Client said that her granddaughter, Rocky , has been staying with her recently and that help of granddaughter, Rocky, has been very useful to client           Discussed mood issues. Completed Gad-7 and PHQ 2/9. Client spoke of support of niece, [name] Party. Client spoke of support of granddaughter, Rocky           Discussed food procurement. Client said she does have meals delivered as scheduled to her home. She gets MOW delivered to her home 5X weekly              Client said she has new glasses and these glasses are helping her to see better.                Client discussed her hospitalization for care in  October of 2025.              Client said she does get SOB occasionally  She said she gets dizzy occasionally             Provided counseling support for client             Encouraged client or her niece, [name] Party, to call LCSW as needed for SW support for client at [phone]             Client was appreciative of call from LCSW today  Patient Goals/Self-Care Activities:  Attend appointments as scheduled with PCP           Take medications as prescribed            Communicate as needed with [name] Party to discuss client needs            Cooperate with [facility] services providers helping client in the home           Cooperate with HHPT rep to participate in weekly HHPT session as scheduled              Communicate as needed with LCSW for SW support  Plan:   Telephone follow up appointment with care management team member scheduled for:  03/25/24 at 1:00 PM         Please go to [facility] [address], [place] [phone]) if you are experiencing a Mental Health or Behavioral Health Crisis or need someone to talk to.  Patient verbalized understanding of Care plan and visit instructions communicated this visit   Glendia Pear  MSW, LCSW [place]/Value Based Care [facility] Licensed  Clinical Social Worker Direct Dial:  [phone] Fax:  [phone] Website:  [email or web address]

## 2024-02-18 NOTE — Patient Outreach (Signed)
 Complex Care Management   Visit Note  02/18/2024  Name:  Jessica [name] MRN: [id number] DOB: 01-21-1934  Situation: Referral received for Complex Care Management related to depression; grief issues  I obtained verbal consent from Patient.  Visit completed with Patient  on the phone  Background:   Past Medical History:  Diagnosis Date   Anxiety    Atrial fibrillation and flutter (HCC)    detected by PPM interrogation (mostly atrial flutter)   AV block, 2nd degree 10/2012   s/p MDT ADDRL1 pacemaker implantation 10/10/2012 by Dr Kelsie   Chronic diastolic heart failure (HCC)    Depression    GERD (gastroesophageal reflux disease)    HH (hiatus hernia)    History of pulmonary embolism 10/2008   Bilateral   Hyperlipidemia    Hypertension    Hypothyroidism    Obesity    Osteoarthritis    Peripheral neuropathy    Right leg   Presence of permanent cardiac pacemaker    Pulmonary nodule    in the lingula. Noted on pre TAVR CT. Will require follow up   Renal insufficiency 2010   S/P TAVR (transcatheter aortic valve replacement)    Shingles 09/17/2014   right lower quadrant   Type II or unspecified type diabetes mellitus without mention of complication, not stated as uncontrolled 2009    Assessment: Patient Reported Symptoms:  Cognitive Cognitive Status: Difficulties with attention and concentration, Able to follow simple commands Cognitive/Intellectual Conditions Management [RPT]: None reported or documented in medical history or problem list   Health Maintenance Behaviors: Annual physical exam, Stress management Health Facilitated by: Stress management  Neurological Neurological Review of Symptoms: Weakness, Dizziness Neurological Management Strategies: Adequate rest, Coping strategies  HEENT HEENT Symptoms Reported: No symptoms reported (she said she has new glasses) HEENT Management Strategies: Coping strategies    Cardiovascular Cardiovascular Symptoms Reported:  Lightheadness, Dizziness, Fatigue Does patient have uncontrolled Hypertension?: No Cardiovascular Management Strategies: Fluid modification, Medication therapy  Respiratory Respiratory Symptoms Reported: Shortness of breath Respiratory Management Strategies: Coping strategies  Endocrine Endocrine Symptoms Reported: Weakness or fatigue, Shortness of breath Is patient diabetic?: Yes Is patient checking blood sugars at home?: No    Gastrointestinal Gastrointestinal Symptoms Reported: No symptoms reported Gastrointestinal Management Strategies: Coping strategies    Genitourinary Genitourinary Symptoms Reported: Urgency, Frequency Other Genitourinary Symptoms: history of UTI; wears pull ups Genitourinary Management Strategies: Incontinence garment/pad  Integumentary Integumentary Symptoms Reported: No symptoms reported Skin Management Strategies: Coping strategies  Musculoskeletal Musculoskelatal Symptoms Reviewed: Difficulty walking, Limited mobility, Unsteady gait, Weakness Musculoskeletal Management Strategies: Coping strategies      Psychosocial Psychosocial Symptoms Reported: Anxiety - if selected complete GAD, Sadness - if selected complete PHQ 2-9, Depression - if selected complete PHQ 2-9 Additional Psychological Details: stress issues Behavioral Management Strategies: Coping strategies Major Change/Loss/Stressor/Fears (CP): Medical condition, self Techniques to Cope with Loss/Stress/Change: Medication, Counseling Quality of Family Relationships: supportive    02/18/2024    PHQ2-9 Depression Screening   Little interest or pleasure in doing things Several days  Feeling down, depressed, or hopeless Several days  PHQ-2 - Total Score 2  Trouble falling or staying asleep, or sleeping too much Several days  Feeling tired or having little energy Several days  Poor appetite or overeating  Several days  Feeling bad about yourself - or that you are a failure or have let yourself or your  family down Several days  Trouble concentrating on things, such as reading the newspaper or watching television Several days  Moving or speaking so slowly that other people could have noticed.  Or the opposite - being so fidgety or restless that you have been moving around a lot more than usual Several days  Thoughts that you would be better off dead, or hurting yourself in some way Not at all  PHQ2-9 Total Score 8  If you checked off any problems, how difficult have these problems made it for you to do your work, take care of things at home, or get along with other people Somewhat difficult  Depression Interventions/Treatment Counseling, Medication    Today's Vitals  BP within normal range, per client information  Pain Scale: 0-10 Pain Score: 0-No pain  Medications Reviewed Today   Medications were not reviewed in this encounter     Recommendation:   PCP Follow-up Continue Current Plan of Care Communicate regularly with niece, [name] Party, to discuss needs of client Cooperate with [facility] Aide who assists client 1X weekly in the home Cooperate with HHPT rep who conducts weekly HHPT session with client in the home Call LCSW as needed for SW support  Follow Up Plan:   Telephone follow up appointment date/time:  03/25/24 at 1;00 PM   Glendia Pear  MSW, LCSW [place]/Value Based Care [facility] Licensed Clinical Social Worker Direct Dial:  [phone] Fax:  [phone] Website:  [email or web address]

## 2024-02-19 ENCOUNTER — Ambulatory Visit: Payer: Medicare Other

## 2024-02-24 ENCOUNTER — Other Ambulatory Visit: Payer: Self-pay | Admitting: Internal Medicine

## 2024-02-26 ENCOUNTER — Other Ambulatory Visit: Payer: Self-pay | Admitting: Internal Medicine

## 2024-03-03 ENCOUNTER — Other Ambulatory Visit: Payer: Self-pay | Admitting: Internal Medicine

## 2024-03-05 ENCOUNTER — Other Ambulatory Visit: Payer: Self-pay | Admitting: Internal Medicine

## 2024-03-10 ENCOUNTER — Telehealth: Payer: Self-pay

## 2024-03-10 NOTE — Telephone Encounter (Signed)
 Copied from CRM #[id number]. Topic: Clinical - Home Health Verbal Orders >> Mar 10, 2024  4:06 PM Ashley R wrote: Caller/Agency: [facility] ([facility]) Callback Number: [phone] (secure VM) Service Requested: Physical Therapy Frequency: 1x week for 8 weeks, ambulation, transfers, balance Any new concerns about the patient? No

## 2024-03-11 ENCOUNTER — Telehealth: Payer: Self-pay

## 2024-03-11 NOTE — Telephone Encounter (Signed)
 Copied from CRM #[id number]. Topic: Clinical - Home Health Verbal Orders >> Mar 10, 2024  4:19 PM Delon DASEN wrote: Caller/Agency: Sari with [facility] Callback Number: [phone] secured line Service Requested: Home Health Aide Frequency: 1x wk for 8 wks Any new concerns about the patient? No

## 2024-03-12 NOTE — Telephone Encounter (Signed)
 Okay.  Thanks.

## 2024-03-12 NOTE — Telephone Encounter (Signed)
 LDVM for Sari Librarian, Academic) Service Requested: Physical Therapy Frequency: 1x week for 8 weeks, ambulation, transfers, balance

## 2024-03-14 NOTE — Telephone Encounter (Signed)
 LVM with verbal ok for Sari with [facility]  Service Requested: Home Health Aide Frequency: 1x wk for 8 wks

## 2024-03-14 NOTE — Telephone Encounter (Signed)
OK w/me Thx 

## 2024-03-21 ENCOUNTER — Ambulatory Visit (INDEPENDENT_AMBULATORY_CARE_PROVIDER_SITE_OTHER): Admitting: Family

## 2024-03-21 VITALS — BP 118/60 | HR 100 | Ht 61.0 in | Wt 209.5 lb

## 2024-03-21 DIAGNOSIS — E785 Hyperlipidemia, unspecified: Secondary | ICD-10-CM

## 2024-03-21 DIAGNOSIS — I10 Essential (primary) hypertension: Secondary | ICD-10-CM

## 2024-03-21 DIAGNOSIS — I5032 Chronic diastolic (congestive) heart failure: Secondary | ICD-10-CM

## 2024-03-21 DIAGNOSIS — I5042 Chronic combined systolic (congestive) and diastolic (congestive) heart failure: Secondary | ICD-10-CM | POA: Diagnosis not present

## 2024-03-21 DIAGNOSIS — I480 Paroxysmal atrial fibrillation: Secondary | ICD-10-CM

## 2024-03-21 DIAGNOSIS — D6859 Other primary thrombophilia: Secondary | ICD-10-CM

## 2024-03-21 MED ORDER — POLYETHYLENE GLYCOL 3350 17 G PO PACK: 17.0000 g | PACK | Freq: Every day | ORAL | Status: AC | PRN

## 2024-03-21 NOTE — Patient Instructions (Addendum)
 Medication Instructions:  Check your pill pack for Ezetimibe  (Zetia ) and Repaglinide .  Let us  know if they are in your pill pack.   *If you need a refill on your cardiac medications before your next appointment, please call your pharmacy*  Lab Work: Your physician recommends that you return for lab work today: BNP, BMET  If you have labs (blood work) drawn today and your tests are completely normal, you will receive your results only by: MyChart Message (if you have MyChart) OR A paper copy in the mail If you have any lab test that is abnormal or we need to change your treatment, we will call you to review the results.  Follow-Up: At [facility], you and your health needs are our priority.  As part of our continuing mission to provide you with exceptional heart care, our providers are all part of one team.  This team includes your primary Cardiologist (physician) and Advanced Practice Providers or APPs (Physician Assistants and Nurse Practitioners) who all work together to provide you with the care you need, when you need it.  Your next appointment:   In January with electrophysiology AND With Dr. Raford or Reche GORMAN Finder, NP in 3-4 months  We recommend signing up for the patient portal called MyChart.  Sign up information is provided on this After Visit Summary.  MyChart is used to connect with patients for Virtual Visits (Telemedicine).  Patients are able to view lab/test results, encounter notes, upcoming appointments, etc.  Non-urgent messages can be sent to your provider as well.   To learn more about what you can do with MyChart, go to [email or web address].   Other Instructions  For your right shoulder and arm trembling;  Recommend putting a heat pack on your right shoulder for 20 minutes 1-2 times per day for 3 days.

## 2024-03-21 NOTE — Progress Notes (Unsigned)
 No colchiins x 1 month  Close to out of entresto  -   Zetia  - add to AVS - Repaglinide   2 AM and 1 PM at 6pm - LAISX  Does not have temazepam  right now - missin in the mail  Exact Care pill pack   Cardiology Office Note   Date:  03/21/2024  ID:  Jessica [name], DOB Aug 26, 1933, MRN [id number] PCP: Garald Karlynn GAILS, MD  [facility] Providers Cardiologist:  Annabella Scarce, MD Electrophysiologist:  OLE ONEIDA HOLTS, MD     History of Present Illness Jessica [name] is a 89 y.o. female  with a hx of nonobstructive CAD, severe AS s/p TAVR with endocarditis, CHB s/p PPM, atrial fibrillation s/p AV ablation 2019, HTN, HLD, DM2, chronic systolic and diastolic heart failure.   Admitted 04/18/2022 with endocarditis with 3 cm vegetation on TAVR prosthesis by TEE 04/05/2022.  No lead vegetation noted.  Inadvertent finding of AKI in the setting of COVID.  On indefinite oral antibiotics.   Admitted 2/11 - 05/19/2022 with acute on chronic diastolic heart failure.  Diuresed 5.9 L.  On discharge oral Lasix , Jardiance , Entresto , metoprolol , Aldactone .  Recommended for discharge to SNF.   Seen 07/14/22 at which time Jardiance  and Eliquis  patient assistance were initiated for heart failure and anticoagulation due to PAF.  At visit 10/13/22 she declined OAC due to cost concerns about Eliquis . At visit 02/2023 provided increased dose Lasix  x 3 days for volume overload.   Last seen 10/19/23 by EP team unaware of any atrial fibrillation. She was agreeable to resume OAC and started on Xarelto .    Pleasant lady who lives at home independently. Her niece often assists. She notes some hand swelling in the morning that improves throughout the day. Discussed possible arthritis. No feet or ankle swelling. Exertional dyspnea stable at baseline. No chest pain, pressure, tightness. Occasional lightheadedness with quick position changes but no near syncope nor syncope. Reports she just started Xarelto   today. ***  01/2024   Sepsis secondary diverticulitis  New onset right side of her body will shake suddenly. No associted with lightheadedness, diaphoresis. Most notable in right arm and shoulder. Not associated with pain. No known injury, she is left handed. Lastsa bout ***.   Not weightin g at home  No LE edema  Participating in PT at home.   Fursoemide 40mg  BID  ROS: Please see the history of present illness.    All other systems reviewed and are negative.   Studies Reviewed      ***   Risk Assessment/Calculations  CHA2DS2-VASc Score = 7   This indicates a 11.2% annual risk of stroke. The patient's score is based upon: CHF History: 1 HTN History: 1 Diabetes History: 1 Stroke History: 0 Vascular Disease History: 1 Age Score: 2 Gender Score: 1   {This patient has a significant risk of stroke if diagnosed with atrial fibrillation.  Please consider VKA or DOAC agent for anticoagulation if the bleeding risk is acceptable.   You can also use the SmartPhrase .[email or web address] for documentation.   :[id number]} No BP recorded.  {Refresh Note OR Click here to enter BP  :1}***       Physical Exam VS:  Pulse (!) 113   Ht 5' 1 (1.549 m)   Wt 209 lb 8 oz (95 kg)   SpO2 95%   BMI 39.58 kg/m        Wt Readings from Last 3 Encounters:  03/21/24 209 lb 8 oz (95 kg)  01/24/24 216 lb (98 kg)  01/11/24 216 lb (98 kg)    GEN: Well nourished, overweight, well developed in no acute distress NECK: No JVD; No carotid bruits CARDIAC: RRR, no murmurs, rubs, gallops RESPIRATORY:  Clear to auscultation without rales, wheezing or rhonchi  ABDOMEN: Soft, non-tender, non-distended EXTREMITIES:  No edema; No deformity   ASSESSMENT AND PLAN Atrial fib s/p ablation / Hypercoaguable state - NSR by auscultation. Denies palpitations. Continue metoprolol  succinate 50 mg daily previously declined Watchman. CHA2DS2-VASc Score = 7 [CHF History: 1, HTN History: 1, Diabetes History: 1, Stroke  History: 0, Vascular Disease History: 1, Age Score: 2, Gender Score: 1].  Therefore, the patient's annual risk of stroke is 11.2 %.   Continue Xarelto  15 mg daily.  Reduced dose due to renal function.  As she reports she just started at today will defer CBC today though consider collection at next office visit.  Chronic systolic and diastolic heart failure -overall euvolemic on exam.  Stable NYHA II symptoms.  GDMT includes Jardiance  10 mg daily, Lasix  40 mg twice daily, Entresto  97-103 mg twice daily, spironolactone  12.5 mg daily.  Low sodium diet, fluid restriction <2L, and daily weights encouraged. Educated to contact our office for weight gain of 2 lbs overnight or 5 lbs in one week.   S/p TAVR / Endocarditis - Completed IV abx now on indefinite PO abx per ID. No lead vegetation per imaging. Per EP, only plan to extract PPM if needs valvular intervention. Is PPM dependent.  CHB s/p PPM - Follows with EP  DM2- managed by PCP.  Recently initiated on Mounjaro  for weight loss and DM2 control benefit.  She reports her pharmacy is out of this medication and will not be able to obtain until Thursday. One time sample provided in clinic.       Dispo: 4-6 mos  Signed, Reche GORMAN Finder, NP

## 2024-03-22 ENCOUNTER — Encounter (HOSPITAL_BASED_OUTPATIENT_CLINIC_OR_DEPARTMENT_OTHER): Payer: Self-pay | Admitting: Family

## 2024-03-22 LAB — BASIC METABOLIC PANEL WITH GFR
BUN/Creatinine Ratio: 22 (ref 12–28)
BUN: 41 mg/dL — ABNORMAL HIGH (ref 8–27)
CO2: 23 mmol/L (ref 20–29)
Calcium: 10.6 mg/dL — ABNORMAL HIGH (ref 8.7–10.3)
Chloride: 97 mmol/L (ref 96–106)
Creatinine, Ser: 1.86 mg/dL — ABNORMAL HIGH (ref 0.57–1.00)
Glucose: 124 mg/dL — ABNORMAL HIGH (ref 70–99)
Potassium: 4.8 mmol/L (ref 3.5–5.2)
Sodium: 139 mmol/L (ref 134–144)
eGFR: 26 mL/min/1.73 — ABNORMAL LOW

## 2024-03-22 LAB — BRAIN NATRIURETIC PEPTIDE: BNP: 122.4 pg/mL — ABNORMAL HIGH (ref 0.0–100.0)

## 2024-03-24 ENCOUNTER — Ambulatory Visit (HOSPITAL_BASED_OUTPATIENT_CLINIC_OR_DEPARTMENT_OTHER): Payer: Self-pay | Admitting: Family

## 2024-03-24 ENCOUNTER — Telehealth: Payer: Self-pay | Admitting: Licensed Clinical Social Worker

## 2024-03-24 ENCOUNTER — Encounter (HOSPITAL_BASED_OUTPATIENT_CLINIC_OR_DEPARTMENT_OTHER): Payer: Self-pay

## 2024-03-24 ENCOUNTER — Other Ambulatory Visit (HOSPITAL_BASED_OUTPATIENT_CLINIC_OR_DEPARTMENT_OTHER): Payer: Self-pay

## 2024-03-24 DIAGNOSIS — I10 Essential (primary) hypertension: Secondary | ICD-10-CM

## 2024-03-24 DIAGNOSIS — Z79899 Other long term (current) drug therapy: Secondary | ICD-10-CM

## 2024-03-24 DIAGNOSIS — R7989 Other specified abnormal findings of blood chemistry: Secondary | ICD-10-CM

## 2024-03-24 MED ORDER — SACUBITRIL-VALSARTAN 97-103 MG PO TABS: 1.0000 | ORAL_TABLET | Freq: Two times a day (BID) | ORAL | 3 refills | Status: DC

## 2024-03-24 MED ORDER — FUROSEMIDE 40 MG PO TABS: 40.0000 mg | ORAL_TABLET | Freq: Two times a day (BID) | ORAL | Status: DC

## 2024-03-24 NOTE — Telephone Encounter (Signed)
-----   Message from Reche Finder, NP sent at 03/24/2024  8:14 AM EST ----- BNP with minimal volume overload.  Kidney function decreased from previous.   Please confirm how taking Lasix : If taking 2 tab AM and 1 tab PM : Reduce to 1 tab BID  If taking 1 tab BID : Reduce to 1 tab daily  Repeat BMP in 7-10 days for monitoring.

## 2024-03-24 NOTE — Telephone Encounter (Signed)
 H&V Care Navigation CSW Progress Note  Clinical Social Worker received message inquiring if pt Medicaid is valid. LCSW noted card scanned in. From previous interactions with pt I believe she only has Medicaid family planning only. LCSW was able to verify on Nctracks that pt Medicaid is in fact only Medicaid family planning ([id number]). Let provider know this, generic Entresto  is about $54 with GoodRx card/mo at current generic rate.   Patient is participating in a Managed Medicaid Plan:  No, UHC Medicare  SDOH Screenings   Food Insecurity: No Food Insecurity (01/11/2024)  Housing: Low Risk (01/11/2024)  Transportation Needs: No Transportation Needs (01/11/2024)  Utilities: Not At Risk (01/11/2024)  Alcohol  Screen: Low Risk (07/05/2023)  Depression (PHQ2-9): Medium Risk (02/18/2024)  Financial Resource Strain: Low Risk (07/05/2023)  Physical Activity: Inactive (11/26/2023)  Social Connections: Socially Isolated (01/11/2024)  Stress: Stress Concern Present (11/26/2023)  Tobacco Use: Low Risk (03/22/2024)  Health Literacy: Adequate Health Literacy (07/05/2023)    Marit Lark, MSW, LCSW Clinical Social Worker [facility] Heart/Vascular Care Navigation  [phone]- work cell phone (preferred)

## 2024-03-24 NOTE — Telephone Encounter (Signed)
 The patient has been notified of the result and verbalized understanding.  All questions (if any) were answered.  Confirmed dosing of lasix  with the pt.   Pt confirmed she is taking lasix --2 tabs in the AM and 1 tab in the PM.   Advised the pt that Reche Finder, NP wants her to decrease her lasix  now to taking 1 tablet BID and come in for repeat BMET in 7-10 days.   Pt confirmed she has plenty of lasix  on hand at this time.   Order for BMET in 7-10 days placed and released in the system.  Pt verbalized understanding and agrees with this plan.

## 2024-03-25 ENCOUNTER — Other Ambulatory Visit: Admitting: Licensed Clinical Social Worker

## 2024-03-25 NOTE — Patient Outreach (Signed)
 Complex Care Management   Visit Note  03/25/2024  Name:  Jessica [name] MRN: [id number] DOB: Apr 24, 1933  Situation: Referral received for Complex Care Management related to anxiety and stress issues I obtained verbal consent from Patient.  Visit completed with Patient  on the phone  Background:   Past Medical History:  Diagnosis Date   Anxiety    Atrial fibrillation and flutter (HCC)    detected by PPM interrogation (mostly atrial flutter)   AV block, 2nd degree 10/2012   s/p MDT ADDRL1 pacemaker implantation 10/10/2012 by Dr Kelsie   Chronic diastolic heart failure (HCC)    Depression    GERD (gastroesophageal reflux disease)    HH (hiatus hernia)    History of pulmonary embolism 10/2008   Bilateral   Hyperlipidemia    Hypertension    Hypothyroidism    Obesity    Osteoarthritis    Peripheral neuropathy    Right leg   Presence of permanent cardiac pacemaker    Pulmonary nodule    in the lingula. Noted on pre TAVR CT. Will require follow up   Renal insufficiency 2010   S/P TAVR (transcatheter aortic valve replacement)    Shingles 09/17/2014   right lower quadrant   Type II or unspecified type diabetes mellitus without mention of complication, not stated as uncontrolled 2009    Assessment: Patient Reported Symptoms:  Cognitive Cognitive Status: Able to follow simple commands, Difficulties with attention and concentration Cognitive/Intellectual Conditions Management [RPT]: None reported or documented in medical history or problem list   Health Maintenance Behaviors: Stress management Health Facilitated by: Stress management  Neurological Neurological Review of Symptoms: Weakness, Dizziness Neurological Management Strategies: Adequate rest, Coping strategies  HEENT HEENT Symptoms Reported: No symptoms reported HEENT Management Strategies: Coping strategies    Cardiovascular Cardiovascular Symptoms Reported: Dizziness, Fatigue, Lightheadness Cardiovascular  Management Strategies: Coping strategies  Respiratory Respiratory Symptoms Reported: Shortness of breath Respiratory Management Strategies: Coping strategies  Endocrine Endocrine Symptoms Reported: Weakness or fatigue, Shortness of breath Is patient diabetic?: Yes    Gastrointestinal Gastrointestinal Symptoms Reported: No symptoms reported Gastrointestinal Management Strategies: Coping strategies    Genitourinary Genitourinary Symptoms Reported: Frequency, Urgency Other Genitourinary Symptoms: history of UTIs; wears  pull ups Genitourinary Management Strategies: Incontinence garment/pad  Integumentary Integumentary Symptoms Reported: Skin changes Additional Integumentary Details: sits often in wheelchair. client to monitor skin since she sits in wheelchair Skin Management Strategies: Coping strategies  Musculoskeletal Musculoskelatal Symptoms Reviewed: Difficulty walking, Limited mobility, Unsteady gait, Weakness Musculoskeletal Management Strategies: Coping strategies      Psychosocial Psychosocial Symptoms Reported: Sadness - if selected complete PHQ 2-9, Anxiety - if selected complete GAD, Depression - if selected complete PHQ 2-9 Additional Psychological Details: stress issues Behavioral Management Strategies: Coping strategies Major Change/Loss/Stressor/Fears (CP): Medical condition, self Techniques to Cope with Loss/Stress/Change: Counseling Quality of Family Relationships: supportive Do you feel physically threatened by others?: No    03/25/2024    PHQ2-9 Depression Screening   Little interest or pleasure in doing things Several days  Feeling down, depressed, or hopeless Several days  PHQ-2 - Total Score 2  Trouble falling or staying asleep, or sleeping too much Several days  Feeling tired or having little energy Several days  Poor appetite or overeating  Several days  Feeling bad about yourself - or that you are a failure or have let yourself or your family down Several  days  Trouble concentrating on things, such as reading the newspaper or watching television Several days  Moving  or speaking so slowly that other people could have noticed.  Or the opposite - being so fidgety or restless that you have been moving around a lot more than usual Several days  Thoughts that you would be better off dead, or hurting yourself in some way Not at all  PHQ2-9 Total Score 8  If you checked off any problems, how difficult have these problems made it for you to do your work, take care of things at home, or get along with other people Somewhat difficult  Depression Interventions/Treatment Counseling    Today's Vitals  BP within normal range per client  Pain Scale: 0-10 Pain Score: 0-No pain  Medications Reviewed Today   Medications were not reviewed in this encounter     Recommendation:   PCP Follow-up Continue Current Plan of Care Call LCSW as needed for SW support Allow time for ADLs completion Allow time for adequate rest; eats daily meals as scheduled Communicate with niece, [name], related to daily needs of client  Follow Up Plan:   Telephone follow up appointment date/time:  04/29/2024 at 2:00 PM    Glendia Pear  MSW, LCSW [place]/Value Based Care [facility] Licensed Clinical Social Worker Direct Dial:  [phone] Fax:  [phone] Website:  [email or web address]

## 2024-03-25 NOTE — Patient Instructions (Signed)
 Visit Information  Thank you for taking time to visit with me today. Please don't hesitate to contact me if I can be of assistance to you before our next scheduled appointment.  Our next appointment is by telephone on 04/29/2024 at 2;00 PM   Please call the care guide team at [phone] if you need to cancel or reschedule your appointment.   Following is a copy of your care plan:   Goals Addressed             This Visit's Progress    VBCI Social Work Care Plan       Problems:   Pain issues             Depression issues; anxiety issues             Transport needs             Fall risk   CSW Clinical Goal(s):   Over the next 30 days the Patient will attend all scheduled medical appointments as evidenced by patient report and care team review of appointment completion in electronic MEDICAL RECORD NUMBER .            Over the next 30 days, patient will use coping skills learned to manage anxiety and stress issues faced AEB client report of reduced anxiety symptoms (she likes to watch golf shows, liked going to watch golf in the past, enjoys visit with niece, [name]., Listens to music)  Interventions:            Spoke client today about client, needs  Client said she has her prescribed medications.              Discussed knee pain issues of client. Client spoke of left knee pain.              Discussed support from PCP. Dr. Garald               Discussed mood of client.She spoke of occasional sadness.              Discussed family support. Client has support from her niece, [name]. Also client has granddaughter who now stays with client. This is helpful to client. Granddaughter helps client with daily needs, with meal assistance, with some ADLs support              Discussed client transport needs. [name], niece, helps client with transport needs              Discussed vision needs of client. Client wears glasses and said she can see well when using her glasses. Discussed sleeping  issues. She spoke of decreased sleep.                                Discussed meals provided. Client has MOWs delivered to her home 5 times weekly.                  Client has pet cat. She enjoys caring for her pet cat                Provided counseling support for client               Discussed client ambulation. She uses wheelchair to ambulate.  She has ramp to use to go in and out of her home  Thanked client for phone call with LCSW today                Client wrote down name and phone number of LCSW  Patient Goals/Self-Care Activities:  Take medications as prescribed             Attend scheduled medical appointments             Use coping skills to manage anxiety issues             Allow time to complete ADLs             Call LCSW as needed for SW support  Plan:   Telephone follow up appointment with care management team member scheduled for:  04/29/2024 at 2:00 PM         Please go to [facility] [address], [place] [phone]) if you are experiencing a Mental Health or Behavioral Health Crisis or need someone to talk to.  Patient verbalized understanding of Care plan and visit instructions communicated this visit   Jessica [name]  MSW, LCSW [place]/Value Based Care [facility] Licensed Clinical Social Worker Direct Dial:  [phone] Fax:  [phone] Website:  [email or web address]

## 2024-03-26 ENCOUNTER — Other Ambulatory Visit (HOSPITAL_COMMUNITY): Payer: Self-pay

## 2024-03-27 ENCOUNTER — Other Ambulatory Visit: Payer: Self-pay | Admitting: Student

## 2024-04-02 ENCOUNTER — Ambulatory Visit: Payer: Self-pay

## 2024-04-02 LAB — BASIC METABOLIC PANEL WITH GFR
BUN/Creatinine Ratio: 16 (ref 12–28)
BUN: 26 mg/dL (ref 8–27)
CO2: 22 mmol/L (ref 20–29)
Calcium: 10.4 mg/dL — ABNORMAL HIGH (ref 8.7–10.3)
Chloride: 100 mmol/L (ref 96–106)
Creatinine, Ser: 1.64 mg/dL — ABNORMAL HIGH (ref 0.57–1.00)
Glucose: 140 mg/dL — ABNORMAL HIGH (ref 70–99)
Potassium: 4.6 mmol/L (ref 3.5–5.2)
Sodium: 140 mmol/L (ref 134–144)
eGFR: 30 mL/min/1.73 — ABNORMAL LOW
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
## 2024-04-08 ENCOUNTER — Telehealth: Payer: Self-pay

## 2024-04-08 ENCOUNTER — Other Ambulatory Visit (HOSPITAL_COMMUNITY): Payer: Self-pay

## 2024-04-08 NOTE — Telephone Encounter (Signed)
 Pharmacy Patient Advocate Encounter  Received notification from [employer] that Prior Authorization for Mounjaro  2.5mg /0.34ml has been CANCELLED due to this medication was previously approved.    PA #/Case ID/Reference #: [id number]

## 2024-04-08 NOTE — Telephone Encounter (Signed)
 Pharmacy Patient Advocate Encounter   Received notification from CoverMyMeds that prior authorization for Mounjaro  2.5mg /0.1ml is due for renewal.   Insurance verification completed.   The patient is insured through [facility].  Action: PA required; PA started via CoverMyMeds. KEY [id number] . Waiting for clinical questions to populate.

## 2024-04-15 ENCOUNTER — Other Ambulatory Visit: Payer: Self-pay

## 2024-04-15 DIAGNOSIS — I4891 Unspecified atrial fibrillation: Secondary | ICD-10-CM

## 2024-04-15 DIAGNOSIS — I10 Essential (primary) hypertension: Secondary | ICD-10-CM

## 2024-04-15 DIAGNOSIS — E118 Type 2 diabetes mellitus with unspecified complications: Secondary | ICD-10-CM

## 2024-04-15 DIAGNOSIS — R7989 Other specified abnormal findings of blood chemistry: Secondary | ICD-10-CM

## 2024-04-15 DIAGNOSIS — Z79899 Other long term (current) drug therapy: Secondary | ICD-10-CM

## 2024-04-15 DIAGNOSIS — I5032 Chronic diastolic (congestive) heart failure: Secondary | ICD-10-CM

## 2024-04-15 DIAGNOSIS — I5043 Acute on chronic combined systolic (congestive) and diastolic (congestive) heart failure: Secondary | ICD-10-CM

## 2024-04-15 MED ORDER — MOUNJARO 2.5 MG/0.5ML ~~LOC~~ SOAJ: 2.5000 mg | SUBCUTANEOUS | 3 refills | Status: AC

## 2024-04-15 MED ORDER — EZETIMIBE 10 MG PO TABS: 10.0000 mg | ORAL_TABLET | Freq: Every day | ORAL | 2 refills | Status: AC

## 2024-04-15 MED ORDER — SACUBITRIL-VALSARTAN 97-103 MG PO TABS: 1.0000 | ORAL_TABLET | Freq: Two times a day (BID) | ORAL | 3 refills | Status: AC

## 2024-04-15 MED ORDER — POTASSIUM CHLORIDE CRYS ER 20 MEQ PO TBCR: 20.0000 meq | EXTENDED_RELEASE_TABLET | Freq: Every day | ORAL | 2 refills | Status: AC

## 2024-04-15 MED ORDER — SPIRONOLACTONE 25 MG PO TABS: 12.5000 mg | ORAL_TABLET | Freq: Every day | ORAL | 2 refills | Status: AC

## 2024-04-15 MED ORDER — FUROSEMIDE 40 MG PO TABS: 40.0000 mg | ORAL_TABLET | Freq: Two times a day (BID) | ORAL | 0 refills | Status: AC

## 2024-04-15 MED ORDER — ROSUVASTATIN CALCIUM 10 MG PO TABS: ORAL_TABLET | ORAL | 3 refills | Status: AC

## 2024-04-15 MED ORDER — RIVAROXABAN 15 MG PO TABS: 15.0000 mg | ORAL_TABLET | Freq: Every day | ORAL | 3 refills | Status: AC

## 2024-04-15 MED ORDER — EMPAGLIFLOZIN 10 MG PO TABS: 10.0000 mg | ORAL_TABLET | Freq: Every day | ORAL | 3 refills | Status: AC

## 2024-04-15 MED ORDER — METOPROLOL SUCCINATE ER 50 MG PO TB24: 50.0000 mg | ORAL_TABLET | Freq: Every day | ORAL | 3 refills | Status: AC

## 2024-04-29 ENCOUNTER — Other Ambulatory Visit: Payer: Self-pay | Admitting: Licensed Clinical Social Worker

## 2024-04-29 NOTE — Patient Instructions (Signed)
 Visit Information  Thank you for taking time to visit with me today. Please don't hesitate to contact me if I can be of assistance to you before our next scheduled appointment.  Our next appointment is by telephone on 06/03/2024 at 3:00 PM    Please call the care guide team at [phone] if you need to cancel or reschedule your appointment.   Following is a copy of your care plan:   Goals Addressed             This Visit's Progress    VBCI Social Work Care Plan   On track    Problems:   Pain issues             Depression issues; anxiety issues             Transport needs             Fall risk   CSW Clinical Goal(s):   Over the next 30 days the Patient will attend all scheduled medical appointments as evidenced by patient report and care team review of appointment completion in electronic MEDICAL RECORD NUMBER .            Over the next 30 days, patient will use coping skills learned to manage anxiety and stress issues faced AEB client report of reduced anxiety symptoms (she likes to watch golf shows, liked going to watch golf in the past, enjoys visit with niece, Jessica [name], Listens to music, spending time with granddaughter)  Interventions:            Spoke client today about client, needs  Client said she has her prescribed medications.              Discussed knee pain issues of client. Client spoke of left knee pain.              Discussed support from PCP. Dr. Garald. Client said she has appointment with PCP on May 29, 2024.               Discussed mood of client.She spoke of occasional sadness.              Discussed family support. Client has support from her niece, Jessica [name]. Also client has granddaughter who now stays with client. This is helpful to client. Granddaughter helps client with daily needs, with meal assistance, with some ADLs support              Discussed client transport needs. Jessica [name], niece, helps client with transport needs              Discussed vision  needs of client. Client wears glasses and said she can see well when using her glasses. She said she obtained new glasses last Summer.                 Client said she has some memory challenges. Client said she uses a calendar to help remember her upcoming appointments. She said her granddaughter also helps client in paying monthly bills.                 Client discussed sleeping issues. She  said she is sleeping better at present                Discussed meals provided. Client has MOWs delivered to her home 5 times weekly. Client has frozen meals she uses occasionally  Client has pet cat. She enjoys caring for her pet cat                Provided counseling support for client               Discussed client ambulation. She uses wheelchair to ambulate.  She has ramp to use to go in and out of her home               Client said she receives home health physical therapy one time weekly                Thanked client for phone call with LCSW today                Encouraged Jessica [name] to call LCSW as needed for SW support at [phone].   Patient Goals/Self-Care Activities:  Take medications as prescribed             Attend scheduled medical appointments             Use coping skills to manage anxiety issues             Allow time to complete ADLs             Call LCSW as needed for SW support  Plan:   Telephone follow up appointment with care management team member scheduled for:  06/03/2024 at 3:00 PM          Please go to [facility] [address] [place] [phone]) if you are experiencing a Mental Health or Behavioral Health Crisis or need someone to talk to.  Patient verbalized understanding of Care plan and visit instructions communicated this visit   Jessica [name]  MSW, LCSW [place]/Value Based [facility] Licensed Clinical Social Worker Direct Dial:  [phone] Fax:  [phone] Website:   [email or web address]

## 2024-04-29 NOTE — Patient Outreach (Signed)
 Complex Care Management   Visit Note  04/29/2024  Name:  Jessica [name] MRN: [id number] DOB: 1933-05-07  Situation: Referral received for Complex Care Management related to grief issues faced; SDOH needs I obtained verbal consent from Patient.  Visit completed with Patient  on the phone  Background:   Past Medical History:  Diagnosis Date   Anxiety    Atrial fibrillation and flutter (HCC)    detected by PPM interrogation (mostly atrial flutter)   AV block, 2nd degree 10/2012   s/p MDT ADDRL1 pacemaker implantation 10/10/2012 by Dr Kelsie   Chronic diastolic heart failure (HCC)    Depression    GERD (gastroesophageal reflux disease)    HH (hiatus hernia)    History of pulmonary embolism 10/2008   Bilateral   Hyperlipidemia    Hypertension    Hypothyroidism    Obesity    Osteoarthritis    Peripheral neuropathy    Right leg   Presence of permanent cardiac pacemaker    Pulmonary nodule    in the lingula. Noted on pre TAVR CT. Will require follow up   Renal insufficiency 2010   S/P TAVR (transcatheter aortic valve replacement)    Shingles 09/17/2014   right lower quadrant   Type II or unspecified type diabetes mellitus without mention of complication, not stated as uncontrolled 2009    Assessment: Patient Reported Symptoms:  Cognitive Cognitive Status: Alert and oriented to person, place, and time, Difficulties with attention and concentration Cognitive/Intellectual Conditions Management [RPT]: None reported or documented in medical history or problem list   Health Maintenance Behaviors: Stress management Health Facilitated by: Stress management  Neurological Neurological Review of Symptoms: Dizziness, Weakness Neurological Management Strategies: Coping strategies  HEENT HEENT Symptoms Reported: No symptoms reported HEENT Management Strategies: Coping strategies    Cardiovascular Cardiovascular Symptoms Reported: Dizziness, Fatigue Cardiovascular Management  Strategies: Coping strategies  Respiratory Respiratory Symptoms Reported: Shortness of breath Respiratory Management Strategies: Coping strategies  Endocrine Endocrine Symptoms Reported: Weakness or fatigue, Shortness of breath    Gastrointestinal Gastrointestinal Symptoms Reported: No symptoms reported Gastrointestinal Management Strategies: Coping strategies    Genitourinary Genitourinary Symptoms Reported: Frequency, Urgency Other Genitourinary Symptoms: history of UTIs; wears pull ups Genitourinary Management Strategies: Incontinence garment/pad  Integumentary Integumentary Symptoms Reported: Skin changes Additional Integumentary Details: sits in wheelchair; has cushion in wheelchair . she said skin is doing well at present Skin Management Strategies: Coping strategies  Musculoskeletal Musculoskelatal Symptoms Reviewed: Difficulty walking, Limited mobility, Unsteady gait, Weakness Musculoskeletal Management Strategies: Coping strategies  At risk for falls.     Psychosocial Psychosocial Symptoms Reported: Sadness - if selected complete PHQ 2-9, Anxiety - if selected complete GAD, Depression - if selected complete PHQ 2-9 Behavioral Management Strategies: Coping strategies Major Change/Loss/Stressor/Fears (CP): Medical condition, self Techniques to Cope with Loss/Stress/Change: Counseling Quality of Family Relationships: supportive Do you feel physically threatened by others?: No Uses wheelchair for ambulation.    04/29/2024    PHQ2-9 Depression Screening   Little interest or pleasure in doing things Several days  Feeling down, depressed, or hopeless Several days  PHQ-2 - Total Score 2  Trouble falling or staying asleep, or sleeping too much Several days  Feeling tired or having little energy Several days  Poor appetite or overeating  Several days  Feeling bad about yourself - or that you are a failure or have let yourself or your family down Several days  Trouble concentrating  on things, such as reading the newspaper or watching television Several days  Moving or speaking so slowly that other people could have noticed.  Or the opposite - being so fidgety or restless that you have been moving around a lot more than usual Several days  Thoughts that you would be better off dead, or hurting yourself in some way Not at all  PHQ2-9 Total Score 8  If you checked off any problems, how difficult have these problems made it for you to do your work, take care of things at home, or get along with other people Somewhat difficult  Depression Interventions/Treatment Counseling    Today's Vitals  BP within normal range per client information  Pain Scale: 0-10 Pain Score: 0-No pain  Medications Reviewed Today   Medications were not reviewed in this encounter     Recommendation:   PCP Follow-up Continue Current Plan of Care Call LCSW as needed for SW support at [phone].  Communicate with niece, [name] Party, as needed regarding ongoing needs of client Participate, as able, in home health physical therapy sessions for client Take medications as prescribed  Follow Up Plan:   Telephone follow up appointment date/time:  06/03/2024 at 3:00 PM    Glendia Pear  MSW, LCSW [place]/Value Based Care [facility] Licensed Clinical Social Worker Direct Dial:  [phone] Fax:  [phone] Website:  [email or web address]

## 2024-05-01 ENCOUNTER — Ambulatory Visit: Payer: Self-pay

## 2024-05-01 NOTE — Telephone Encounter (Signed)
 FYI Only or Action Required?: Action required by provider: update on patient condition.  Patient was last seen in primary care on 01/24/2024 by Plotnikov, Jessica GAILS, MD.  Called Nurse Triage reporting Dizziness.  Symptoms began yesterday.  Interventions attempted: Rest, hydration, or home remedies.  Symptoms are: completely resolved.  Triage Disposition: See PCP Within 2 Weeks  Patient/caregiver understands and will follow disposition?: Yes    Message from Alfonso ORN sent at 05/01/2024  9:45 AM EST  Reason for Triage: last night pt exp falling over to the right, unstable/ falling out yesterday feeling better at this time but is a heart patient and would like to schedule to see provider   Reason for Disposition  [1] MILD dizziness (e.g., walking normally) AND [2] has been evaluated by doctor (or NP/PA) for this  Answer Assessment - Initial Assessment Questions 1. DESCRIPTION: Describe your dizziness.     Light headedness yesterday.              Patient called in to triage with complaints of dizziness-lightheadedness last night. Now resolved.  This occurred yesterday.  The patient stated she felt like she was going to pass out, while sitting in her wheelchair. EMS was called, she declined to be seen in the ED, so EMS left. She is calling to schedule with PCP. She did not lose consciousness. No other symptoms noted. An appointment was offered with an alternative provider, she has declined and wants to see what PCP advises.  Protocols used: Dizziness - Lightheadedness-A-AH

## 2024-05-06 NOTE — Telephone Encounter (Signed)
 Attempted to call patient but had to leave a voice message

## 2024-05-14 ENCOUNTER — Ambulatory Visit: Admitting: Cardiology

## 2024-05-20 ENCOUNTER — Ambulatory Visit: Payer: Medicare Other

## 2024-05-29 ENCOUNTER — Ambulatory Visit: Admitting: Internal Medicine

## 2024-06-03 ENCOUNTER — Telehealth: Admitting: Licensed Clinical Social Worker

## 2024-07-08 ENCOUNTER — Ambulatory Visit

## 2024-07-15 ENCOUNTER — Ambulatory Visit (HOSPITAL_BASED_OUTPATIENT_CLINIC_OR_DEPARTMENT_OTHER): Admitting: Family

## 2024-08-04 ENCOUNTER — Ambulatory Visit: Admitting: Internal Medicine

## 2024-08-19 ENCOUNTER — Ambulatory Visit: Payer: Medicare Other

## 2024-11-18 ENCOUNTER — Ambulatory Visit: Payer: Medicare Other
